# Patient Record
Sex: Male | Born: 1960 | Race: White | Hispanic: No | Marital: Married | Smoking: Former smoker
Health system: Southern US, Community
[De-identification: ages and names within clinical notes are randomized; demographics above are authoritative.]

## PROBLEM LIST (undated history)

## (undated) DIAGNOSIS — I2510 Atherosclerotic heart disease of native coronary artery without angina pectoris: Secondary | ICD-10-CM

## (undated) DIAGNOSIS — E119 Type 2 diabetes mellitus without complications: Secondary | ICD-10-CM

## (undated) DIAGNOSIS — R51 Headache: Secondary | ICD-10-CM

## (undated) DIAGNOSIS — E669 Obesity, unspecified: Secondary | ICD-10-CM

## (undated) DIAGNOSIS — I639 Cerebral infarction, unspecified: Secondary | ICD-10-CM

## (undated) DIAGNOSIS — L0591 Pilonidal cyst without abscess: Secondary | ICD-10-CM

## (undated) DIAGNOSIS — M109 Gout, unspecified: Secondary | ICD-10-CM

## (undated) DIAGNOSIS — D496 Neoplasm of unspecified behavior of brain: Secondary | ICD-10-CM

## (undated) DIAGNOSIS — R600 Localized edema: Secondary | ICD-10-CM

## (undated) DIAGNOSIS — I509 Heart failure, unspecified: Secondary | ICD-10-CM

## (undated) DIAGNOSIS — R233 Spontaneous ecchymoses: Secondary | ICD-10-CM

## (undated) DIAGNOSIS — N200 Calculus of kidney: Secondary | ICD-10-CM

## (undated) DIAGNOSIS — R238 Other skin changes: Secondary | ICD-10-CM

## (undated) DIAGNOSIS — I255 Ischemic cardiomyopathy: Secondary | ICD-10-CM

## (undated) DIAGNOSIS — I219 Acute myocardial infarction, unspecified: Secondary | ICD-10-CM

## (undated) DIAGNOSIS — R0602 Shortness of breath: Secondary | ICD-10-CM

## (undated) DIAGNOSIS — I10 Essential (primary) hypertension: Secondary | ICD-10-CM

## (undated) DIAGNOSIS — J189 Pneumonia, unspecified organism: Secondary | ICD-10-CM

## (undated) DIAGNOSIS — M797 Fibromyalgia: Secondary | ICD-10-CM

## (undated) HISTORY — DX: Essential (primary) hypertension: I10

## (undated) HISTORY — PX: EYE SURGERY: SHX253

## (undated) HISTORY — DX: Ischemic cardiomyopathy: I25.5

## (undated) HISTORY — DX: Obesity, unspecified: E66.9

## (undated) HISTORY — PX: UMBILICAL HERNIA REPAIR: SHX196

## (undated) HISTORY — PX: CORONARY ARTERY BYPASS GRAFT: SHX141

## (undated) HISTORY — PX: ANAL FISTULECTOMY: SHX1139

## (undated) HISTORY — DX: Atherosclerotic heart disease of native coronary artery without angina pectoris: I25.10

## (undated) HISTORY — DX: Fibromyalgia: M79.7

---

## 2003-03-11 ENCOUNTER — Ambulatory Visit (HOSPITAL_COMMUNITY): Admission: RE | Admit: 2003-03-11 | Discharge: 2003-03-11 | Payer: Self-pay

## 2007-04-08 ENCOUNTER — Ambulatory Visit (HOSPITAL_COMMUNITY): Admission: AD | Admit: 2007-04-08 | Discharge: 2007-04-09 | Payer: Self-pay | Admitting: Surgery

## 2007-04-08 ENCOUNTER — Encounter (INDEPENDENT_AMBULATORY_CARE_PROVIDER_SITE_OTHER): Payer: Self-pay | Admitting: Surgery

## 2007-12-09 ENCOUNTER — Inpatient Hospital Stay (HOSPITAL_COMMUNITY): Admission: EM | Admit: 2007-12-09 | Discharge: 2007-12-16 | Payer: Self-pay | Admitting: Emergency Medicine

## 2007-12-10 ENCOUNTER — Encounter: Payer: Self-pay | Admitting: Cardiothoracic Surgery

## 2007-12-11 ENCOUNTER — Encounter: Payer: Self-pay | Admitting: Cardiothoracic Surgery

## 2007-12-11 ENCOUNTER — Ambulatory Visit: Payer: Self-pay | Admitting: Cardiothoracic Surgery

## 2008-01-10 ENCOUNTER — Encounter: Admission: RE | Admit: 2008-01-10 | Discharge: 2008-01-10 | Payer: Self-pay | Admitting: Cardiothoracic Surgery

## 2008-01-10 ENCOUNTER — Ambulatory Visit: Payer: Self-pay | Admitting: Cardiothoracic Surgery

## 2011-01-03 NOTE — Op Note (Signed)
NAME:  BALEN, PINAULT NO.:  [id number]   MEDICAL RECORD NO.:  [id number]          PATIENT TYPE:  INP   LOCATION:  2306                         FACILITY:  [facility]   PHYSICIAN:  Ivin Poot, M.D.  DATE OF BIRTH:  [birth year]   DATE OF PROCEDURE:  12/12/2007  DATE OF DISCHARGE:                               OPERATIVE REPORT   OPERATION:  1. Coronary artery bypass grafting x1 using left internal mammary      artery to LAD.  2. Cardiopulmonary bypass with moderate hypothermia and hyperkalemic      cold cardioplegic arrest.   SURGEON:  Ivin Poot, MD   ASSISTANT:  Lars Pinks, PA   ANESTHESIA:  General.   PRE AND POSTOPERATIVE DIAGNOSES:  1. Class IV unstable angina with occlusion of the LAD.  2. Anterior - apical hypokinesia.   INDICATIONS:  Mr. Tardy is a 50 year old morbidly obese smoker who  presented with accelerating chest pain and mildly positive enzymes.  Cardiac catheterization by Dr. Acie Fredrickson demonstrated an occluded LAD with  reconstitution via collaterals.  The circumflex right coronary did not  have significant stenosis and he had anterior LV dysfunction by echo and  ventriculogram.  He is felt to be a candidate for surgical evaluation  since he had a long segment total occlusion of the LAD, which would be  very difficult to treat percutaneously.   Prior to surgery, I examined the patient in the CCU Room and reviewed  the results of his cardiac cath with the patient and his wife.  I  discussed indications and expected benefits of coronary artery bypass  surgery for treatment of his severe coronary artery disease.  I reviewed  the alternatives to surgical therapy as well.  I discussed the major  issues of the operation including the location of the surgical  incisions, use of general anesthesia, and cardiopulmonary bypass, and  the expected postoperative hospital recovery.  He understood that due to  his morbid obesity with weight  close to 400 pounds that he would be at  increased risk for pulmonary complications following surgery as well as  stroke, bleeding, wound infection, blood transfusion requirement, MI,  and death.  After reviewing these issues, he demonstrated his  understanding and agreed to proceed with operation under what I felt was  an informed consent.   OPERATIVE FINDINGS:  The patient did have some disease of a diagonal  branch and endoscopic vein harvest was attempted in both lower legs but  the vein was not identified due to his massive size leg and significant  subcutaneous fat.  I did not feel that an open leg incision with his  risk of nonhealing or worse would be indicated to graft a small  diffusely diseased diagonal.  So, we did not graft the diagonal.  The  mammary artery had excellent flow and provided a good graft to the  anterior wall via the LAD, which was a large vessel as well.  The  patient did not require any blood products for this operation.  Exposure  of the heart was extremely difficult due to his  morbid obesity and body  habitus.  Vessels were deeply buried in a thick layer of epicardial fat.  There was some thinning of the LV apex.   PROCEDURE:  The patient was brought to the operating room, placed supine  on the operating table where general anesthesia was induced.  The chest,  abdomen, and legs were prepped with Betadine and draped as a sterile  field.  A sternal incision was made in the left internal mammary artery  was harvested as a pedicle graft from its origin at the subclavian  vessels.  The sternal retractor was placed using the deep blades and the  pericardium was opened and suspended.  Saphenous vein was unattainable  from either leg using endoscopic vein harvest technique.  Pursestrings  were placed in the ascending aorta and right atrium.  The patient was  given heparin and ACT was documented as being therapeutic.  The patient  was then cannulated and placed on  bypass.  The LAD was identified for  grafting in the mid vessel.  The diagonal branches were very small and  diffusely diseased.  The internal mammary artery was prepared for the  distal anastomosis and a cardioplegic catheter was placed in the  ascending aorta.  The patient was cooled to 32 degrees and aortic  crossclamp was applied.  A 1.2 liters of cold blood cardioplegia was  delivered to the aortic root with good cardioplegic arrest and several  temperature dropping less than 18 degrees.   The distal coronary anastomoses were then performed.  The LAD was opened  and was found to be a 1.75-mm vessel with total proximal occlusion.  The  coronary probe passed easily to the apex.  The left internal mammary  artery pedicle was brought through an opening, created in the left  lateral pericardium.  It was brought down onto the LAD and sewn end-to-  side with running 8-0 Prolene.  There is good flow through the  anastomosis after briefly releasing the pedicle bulldog on the mammary  artery.  The bulldog was reapplied and a pedicle was secured at the  epicardium.   The crossclamp was then removed and the bulldog was removed from the  mammary artery.  The heart was reperfused and resumed a spontaneous  rhythm.  The distal anastomosis was checked and found be hemostatic.  The temporary pacing wires were applied.  The patient was rewarmed to 37  degrees.  The lungs were re-expanded and ventilator was resumed.  The  patient was then weaned off bypass with low-dose dopamine without  difficulty.  Cardiac output and blood pressure were stable.  He was in a  atrially paced rhythm at 90 beats per minute.  Protamine was  administered without adverse reaction.  The cannulas were removed.  The  mediastinum was irrigated with warm saline.  The superior pericardial  fat was closed over the aorta.  Two mediastinal and a left pleural chest  tube were placed, brought out through separate incisions.  The  sternum  was closed with interrupted double steel wires.  The pectoralis fascia  was closed in running #1 Vicryl and the subcutaneous and skin layers  were closed in running Vicryl.  Total bypass time was 74 minutes.  The  patient will return to the ICU in stable condition.      Ivin Poot, M.D.  Electronically Signed     PV/MEDQ  D:  12/12/2007  T:  12/13/2007  Job:  [id number]   cc:  Thayer Headings, M.D.

## 2011-01-03 NOTE — Op Note (Signed)
NAME:  [name], RAO NO.:  [id number]   MEDICAL RECORD NO.:  [id number]          PATIENT TYPE:  OIB   LOCATION:  6731                         FACILITY:  [facility]   PHYSICIAN:  Fenton Malling. Lucia Gaskins, M.D.  DATE OF BIRTH:  02/10/1961   DATE OF PROCEDURE:  04/08/2007  DATE OF DISCHARGE:                               OPERATIVE REPORT   PREOPERATIVE DIAGNOSIS:  Incarcerated umbilical hernia.   POSTOPERATIVE DIAGNOSIS:  Incarcerated umbilical hernia, approximately 6  cm fascial defect, incarcerated omentum.   PROCEDURE:  Open umbilical hernia repair, resection of omentum, repair  with underlay 8 x 14 cm MTF thick mesh.  (45 plus minutes dissecting  hernia/incarcerated omentum.)   SURGEON:  Fenton Malling. Lucia Gaskins, M.D.   FIRST ASSISTANT:  None.   ANESTHESIA:  General endotracheal.   ESTIMATED BLOOD LOSS:  150 mL.   DRAINS:  19 Blake drain.   INDICATIONS:  Mr. Montague is a 50 year old white male who is morbidly  obese, patient of Dr. Claris Gower, who has had an umbilical hernia  long standing, however, over the last few days it has become  increasingly tender and developed some redness around it.  He presented  to the urgent office where he was seen by Dr. Verita Lamb, who asked me  to see him for consultation for evaluation and repair of this hernia.   My feeling is that he has a problem incarcerated hernia, he has some  surrounding redness, was unclear whether this has already infarcted  tissue or some localized cellulitis, it thought it would be best served  tonight.   The indications and potential complications explained to the patient, of  surgery include bleeding, infection, recurrence of the hernia and bowel  resection.   DESCRIPTION OF PROCEDURE:  The patient was placed in the supine  position, given a general endotracheal anesthetic.  He was given 2 g of  Ancef initially at the beginning of the procedure.  His abdomen was  prepped and draped with Betadine  solution and sterilely draped.   I made a linear incision down on top of a large chronically incarcerated  umbilical hernia.  This sac probably was 14 to 16 cm in diameter.  It  narrowed down to approximately 5 to 6 cm defect in the fascia.  I opened  the sac.  I was unable to reduce the omentum, so I resected a large  amount of his omentum.  I resected using 2-0 Vicryl ties.  I then was  able to reduce the transverse colon and remaining omentum back into the  abdominal cavity.  I then resected the sac down to the fascial level.  I  transected the sac but the sac had a very good blood supply and I had to  ligate each with clamps, with 2-0 Vicryl sutures.  I then developed a  plane between the fascial surface and the peritoneum and tried to go  subfascial extraperitoneal repair.   I used an 8 x 14 cm piece of MTF thick human AlloDerm.   I laid this down first under the fascia (but on top of the  peritoneum).  I tacked it in 8 spots using #1 PDS suture, and this was an underlay.   Then I closed the fascia transversely using interrupted #1 Novafil  sutures.   At this point I irrigated the wound.  I closed the subcutaneous tissue  with some 2-0 Vicryl sutures.  I placed a #19 Blake drain through a stab  wound in the left lateral abdomen and then closed the skin both with 2-0  nylon and staples, and I used the 2-0 nylon to hold the drain in place.   The patient tolerated the procedure well.  The patient did have redness  around the hernia preoperatively but had no obvious compromised bowel in  getting to the hernial sac.  He tolerated the procedure well.  I did  spend extra time dissecting this hernial sac and omentum which I think  was a tune of bout 45 minutes to an hour.   This was all because of his weight and size of the hernia and his  defect.      Fenton Malling. Lucia Gaskins, M.D.  Electronically Signed     DHN/MEDQ  D:  04/08/2007  T:  04/09/2007  Job:  [id number]   cc:   Claris Gower, M.D.

## 2011-01-03 NOTE — Consult Note (Signed)
NAME:  [name], [name] NO.:  [id number]   MEDICAL RECORD NO.:  [id number]           PATIENT TYPE:   LOCATION:                                 FACILITY:   PHYSICIAN:  Ivin Poot, M.D.  DATE OF BIRTH:  May 27, 1961   DATE OF CONSULTATION:  DATE OF DISCHARGE:                                 CONSULTATION   PHYSICIAN REQUESTING CONSULTATION:  Thayer Headings, MD   PRIMARY CARE PHYSICIAN:  Claris Gower, MD   REASON FOR CONSULTATION:  Severe 2-vessel coronary artery disease.   CHIEF COMPLAINT:  Chest pain.   HISTORY OF PRESENT ILLNESS:  I was asked to evaluate this 50 year old  morbidly obese male smoker for potential surgical coronary  revascularization for recently diagnosed severe LAD diagonal stenosis.  The patient has had progressive dyspnea on exertion, ankle swelling, and  chest discomfort with exertion.  This progressed postprandial  indigestion and chest pain lasting for 12 hours radiating to the left  hand.  It was improved with nitroglycerin, and he presented to the  hospital for admission.  His initial set of cardiac enzymes were mildly  elevated with a CPK-MB of 12 ng/mL, which progressed to 22 ng/mL.  His  12-lead EKG showed a sinus rhythm with some mild ST-segment changes.  He  remained hemodynamically stable.  He underwent a left heart diagnostic  catheterization today by Dr. Acie Fredrickson, which demonstrated a 99% stenosis  of the LAD with a 90% stenosis of the diagonal and a thrombus in the  LAD.  The circumflex and RCA did not have significant lesions.  His EF  was 45% with hypokinesia of the LV apex.  He was placed on Integrilin  and heparin and has been clinically stable.  Because of his coronary  anatomy and symptoms.  He was felt to be a potential candidate for  surgical revascularization.   PAST MEDICAL HISTORY:  1. Hypertension.  2. Morbid obesity.  3. Active smoking.  4. Status post repair of umbilical hernia by Dr. Alphonsa Overall.  5.  Kidney stones.   MEDICATIONS:  1. Timolol 50 mg daily.  2. Aspirin 1 daily.   ALLERGIES:  MORPHINE and CODEINE cause him to have bradycardia.  He can  take hydrocodone.   SOCIAL HISTORY:  The patient is married and runs a malt service.  He  smokes a half-a-pack a day and also chews snuff.  His alcohol intake is  minimal.   FAMILY HISTORY:  Positive for coronary artery disease, positive for  obesity, and positive for diabetes.   REVIEW OF SYSTEMS:  He states he had an accident on his [employer] machine  at work and developed some soft tissue injury of his right lower leg  with some cellulitis, which responded to oral antibiotics.  Otherwise,  he has had no other serious musculoskeletal injuries.  He denies history  of stroke or mini stroke or TIA.  He denies history of DVT or  claudication.  He denies any bleeding disorders.  He states he tolerated  the umbilical hernia repair well with general anesthesia and was  discharged home the  same day of the surgery.  He denies any recent  pulmonary infections or productive cough.  He denies history of  jaundice, blood per rectum, or hepatitis.  He has never received blood  transfusions previously.   PHYSICAL EXAM:  GENERAL:  The patient is 5 feet 11 inches and weighs 370  pounds.  General appearance is that of a middle-aged, morbidly obese  male in the CCU in no acute distress.  VITAL SIGNS:  Blood pressure 110/70, pulse 88 and regular, respirations  18, and saturation 97% on room air.  HEENT:  Normocephalic.  He has poor dentition.  NECK:  Without JVD, mass, or carotid bruit.  LYMPH:  Lymphatics reveal no palpable supraclavicular adenopathy.  LUNGS:  Breath sounds are diminished, but without wheeze.  CARDIAC:  Regular without S3, gallop, or murmur.  ABDOMINAL:  Morbidly obese with a well-healed umbilical surgical scar.  EXTREMITIES:  Reveal 1 to 2+ edema with palpable pulses in all  extremities.  He has a compression dressing his right  groin from the  cardiac cath, and his neurologic exam is intact.  There is no evidence  of cellulitis or other dermatologic problems of his lower extremities  with only mild venostasis changes.   LABORATORY DATA:  Reviewed the coronary arteriograms from Dr. Elmarie Shiley  cath and agree with his impression of severe 2-vessel disease.  The  patient was scheduled for surgical revascularization on Thursday, December 12, 2007.  Prior to surgery, we will obtain a 2D echo to assess his  pulmonary function with a chest x-ray and a blood gas and maintain his  heparin and Integrilin.   Thank you for the consultation.      Ivin Poot, M.D.  Electronically Signed     PV/MEDQ  D:  12/10/2007  T:  12/11/2007  Job:  [id number]

## 2011-01-03 NOTE — H&P (Signed)
NAME:  [name], MOTTL NO.:  [id number]   MEDICAL RECORD NO.:  [id number]          PATIENT TYPE:  INP   LOCATION:  2917                         FACILITY:  [facility]   PHYSICIAN:  Thayer Headings, M.D. DATE OF BIRTH:  [birth year]   DATE OF ADMISSION:  12/09/2007  DATE OF DISCHARGE:                              HISTORY & PHYSICAL   Ketrick Przywara is a middle-aged gentleman who is admitted today with  some episodes of chest discomfort.   Mr. Brutsche is a middle-aged gentleman with a history of hypertension,  obesity and cigarette smoking.  He has been in fairly good health.  He  admits to not really taking good care of himself.  He does not need any  special diet.   He started having chest pain around 1:00 a.m. this morning.  He thought  was indigestion and decided to ignore.  He was able to go to sleep for a  couple hours but then the pain will come up again.  He presented to see  Dr. Arelia Sneddon and was found to have some nonspecific but worrisome EKG  changes.  He is referred to our office for further evaluation.   He received sublingual nitroglycerin from Dr. Arelia Sneddon with almost  complete resolution of his chest pain.   He denies any syncope or presyncope.  He has been a little bit  lightheaded since taking the nitroglycerin earlier today.   CURRENT MEDICATIONS:  Current medications are atenolol 25 mg p.o. b.i.d.  He has been intolerant to morphine sulfate in past.   ALLERGIES:  He also is allergic to PENICILLIN.   PAST MEDICAL HISTORY:  History of hypertension.   SOCIAL HISTORY:  The patient smokes about a quater-pack a day and dips  tobacco.   FAMILY HISTORY:  His family history is positive for cardiac disease.   REVIEW OF SYSTEMS:  His review of systems is reviewed, is essentially  negative except as noted in the HPI.   PHYSICAL EXAMINATION:  GENERAL: On exam he is a middle-aged gentleman in  no acute distress.  He is alert nor did x3 and his mood and  affect are  normal.  VITAL SIGNS: His weight is 360, blood pressures 100/60 with heart rate  of 73.  HEENT: 2+ carotids.  He has no bruits, no JVD, no thyromegaly.  LUNGS: Clear to auscultation.  Heart: Regular rate S1-S2.  ABDOMEN: Abdominal exam reveals good bowel sounds and is nontender.  EXTREMITIES: There is no clubbing, cyanosis or edema.  NEURO: Exam is nonfocal.   His EKG reveals normal sinus rhythm.  He has some T-wave inversions in  leads II, III and aVF with some nonspecific changes in the leads V6.   Mr. Widrig presents with some worrisome episodes of chest pain.  He has  nonspecific EKG.  At this point, we will go ahead and admit him to the  hospital.  We will put on heparin and Integrilin as well as a  nitroglycerin drip.  We will start him on an aspirin a day.  We will  perform a heart catheterization tomorrow.  We have discussed  the risks,  benefits and options of heart catheterization.  He understands and  agrees to proceed.  All of his other medical problems remain fairly  stable.           ______________________________  Thayer Headings, M.D.     PJN/MEDQ  D:  12/09/2007  T:  12/10/2007  Job:  [id number]   cc:   Claris Gower, M.D.

## 2011-01-03 NOTE — Cardiovascular Report (Signed)
NAME:  [name], [name] NO.:  [id number]   MEDICAL RECORD NO.:  [id number]          PATIENT TYPE:  INP   LOCATION:  2917                         FACILITY:  [facility]   PHYSICIAN:  Thayer Headings, M.D. DATE OF BIRTH:  July 28, 1961   DATE OF PROCEDURE:  12/10/2007  DATE OF DISCHARGE:                            CARDIAC CATHETERIZATION   INDICATIONS FOR PROCEDURE:  [name] is a 50 year old  gentleman who was admitted yesterday with a 12-hour history of chest  pain and an abnormal EKG.  He ended up with unstable angina and was  found to have mildly positive troponin levels.  He was referred for  heart catheterization for further evaluation.   PROCEDURE:  Left heart catheterization with coronary angiography.  The  right femoral artery was easily cannulated using modified Seldinger  technique.   HEMODYNAMICS:  LV pressure is 138/70 with an aortic pressure of 136/75.   Angiography of left main, the left main has minor luminal  irregularities.   The left anterior descending artery is a very large vessel.  There is  extensive plaque and ectasia throughout the vessel.   There is a proximal 80% stenosis of the bifurcation of the diagonal and  LAD.  Following this, there is an aneurysmal area followed by subtotal  occlusion of the LAD.  This is associated with a very large thrombus  burden.  Following this, the LAD is severely and diffusely diseased.  There is a long 70%-80% stenosis at about 50 mm in length.  This opens  up into the distal LAD which is a fairly good target for either bypass  grafting or stenting.   The first diagonal artery is occluded shortly after the takeoff.  The  distal aspect of this first diagonal artery fills very slowly via left-  to-left collaterals.  There is also some filling of either the LAD or  the diagonal system from right-to-left collaterals.   The left circumflex artery is a large vessel.  There are minor luminal  irregularities.  It gives off a first obtuse marginal artery which has  minor luminal irregularities.  The posterolateral branch has only minor  luminal irregularities.   The right coronary artery is large and dominant.  There is a proximal  28%-30% stenosis.  There are mild irregularities throughout the body of  the RCA.  There is a 40%-50% stenosis in the proximal aspect of the  posterior descending artery.   Left ventriculogram was performed in a 30 RAO position.  It reveals  mildly depressed left ventricular systolic function.  Ejection fraction  was approximately 45%.  There is some mild hypokinesis of the anterior  wall.  There is no significant mitral regurgitation.   COMPLICATIONS:  None.   CONCLUSION:  Severe disease involving the left anterior descending and  diagonal system.  We will have cardiovascular-thoracic surgery evaluate  the patient.  We will continue Integrilin and heparin for now.  We could  fix this via percutaneous methods, although it would involve multiple  stents and would not have a fairly high risk of restenosis.  ______________________________  Thayer Headings, M.D.     PJN/MEDQ  D:  12/10/2007  T:  12/10/2007  Job:  [id number]   cc:   Claris Gower, M.D.

## 2011-01-03 NOTE — Discharge Summary (Signed)
NAME:  [name], [name] NO.:  [id number]   MEDICAL RECORD NO.:  [id number]          PATIENT TYPE:  INP   LOCATION:  2017                         FACILITY:  [facility]   PHYSICIAN:  Ivin Poot, M.D.  DATE OF BIRTH:  [birth year]   DATE OF ADMISSION:  12/09/2007  DATE OF DISCHARGE:  12/16/2007                               DISCHARGE SUMMARY   ADMITTING DIAGNOSES:  1. Unstable angina.  2. Coronary artery disease (EF of 45%).  3. History of hypertension.  4. History of morbid obesity.   DISCHARGE DIAGNOSES:  1. Unstable angina.  2. Coronary artery disease (EF of 45%).  3. History of hypertension.  4. History of morbid obesity.  5. Postoperative atrial fibrillation.   PROCEDURES:  1. Cardiac catheterization performed by Dr. Acie Fredrickson on December 10, 2007.  2. Aortocoronary bypass x1 (LIMA to LAD) by Dr. Prescott Gum on December 12, 2007.   [place] COURSE:  [name] is a 50 year old Caucasian male  with a history of morbid obesity, hypertension, and tobacco use who  presented with chest discomfort and progressive dyspnea upon exertion  and ankle swelling.  His chest pain then began radiating to his left  hand.  His initial set of cardiac enzymes were mildly elevated with a  CPK-MB of 12, which was then increased to 22.  His 12-lead EKG showed  sinus rhythm with some mild ST-segment changes.  He did remain  hemodynamically stable.  He underwent a heart catheterization with Dr.  Acie Fredrickson, which demonstrated a 99% stenosis of the LAD with thrombus and a  90% stenosis of the diagonal.  The circumflex and RCA did not have  significant lesions.  His EF was estimated to be at 45% with hypokinesia  left ventricular apex.  He was then placed on Integrilin and heparin.  Carotid study showed no significant internal carotid artery stenosis.  The patient also had an echocardiogram done on December 11, 2007, which  showed no significant valvular disease, and minimal  pericardial effusion  and decreased left ventricular function were noted.  Integrilin was  stopped on December 11, 2007.  The patient then underwent CABG with Dr. Prescott Gum on December 12, 2007.  Chest tubes were removed on December 13, 2007.  Subsequent chest x-ray revealed no pneumothorax.  Postop, the patient  had atrial fibrillation.  He was placed on an amiodarone drip, all by  p.o. amiodarone, in addition, because of the AFib with RVR, the patient  was given IV digoxin, and then placed on 0.125 mg p.o. digoxin daily.  The patient did convert to normal sinus rhythm.  He was volume  overloaded and diuresed appropriately (leukocytosis).  The patient  remained afebrile.  He was placed on Avelox daily and gradually in next  couple of days, his leukocytosis continued to decrease, his last white  blood cell count was 12,600 (on December 16, 2007,) and again he remained  afebrile.  The patient was doing well with cardiac rehab.  His oxygen  requirements were decreasing daily, and he continued to improve such  that by  December 16, 2007, the patient again was in normal sinus rhythm.  He was afebrile.  His vital signs were stable.  His O2 saturation was  93% on room air.  Chest x-ray today showed improved aeration of the  lungs bilaterally, basilar atelectasis, and small bilateral pleural  effusions were present.   PHYSICAL EXAMINATION:  CARDIOVASCULAR: Regular rate and rhythm.  PULMONARY: Lungs were slightly diminished at the bases.  Wounds were  clean and dry.  No signs of infection.   The patient is going to be discharged home today.   PREVIOUS LABORATORY STUDIES:  Revealed that the last CBC was done on  December 16, 2007, H and H was stable 13.7 and 40.0 respectively, white  count of 2600, platelet count of 328,000.  Last BMP was also done on  this date, potassium is 3.9, BUN and creatinine was 15 and 1.6  respectively.  Last chest x-ray was done again today, results noted  previously.   DISCHARGE  INSTRUCTIONS:  As follows:  Epicardial pacing wires have been  removed.  Chest tube sutures will be removed.  Steri-Strips will be  applied.  In addition, PICC line will be removed prior to the patient's  discharge.  He is instructed not to drive or lift more than 10 pounds  until instructed, otherwise.  He is to continue using his inspirometer  and do breathing exercises daily as well as walk daily.  He is to remain  on a low-fat, low-salt diet.  He is to stop smoking.   FOLLOWUP APPOINTMENTS:  Include:  Dr. Elmarie Shiley office in 2 weeks.  The  patient is to call to arrange an appointment.  The patient has  appointment to see Dr. Prescott Gum on Jan 10, 2008, and prior to his  appointment, a chest x-ray will be obtained.   DISCHARGE MEDICATIONS:  Include the following:  1. Atenolol 25 mg p.o. daily.  2. Zocor 20 mg p.o. at bedtime.  3. Amiodarone 400 mg p.o. 2 times daily x5 days, and amiodarone 200 mg      p.o. 2 times daily thereafter.  4. Lisinopril 10 mg p.o. daily.  5. Advair Diskus inhaler 250/50, one puff two times daily, the inhaler      is to be given to the patient prior to discharge.  Once his inhaler      is finished, he may stop this medication.  6. Avelox 400 mg p.o. daily x5 days.  7. Digoxin 0.125 mg p.o. daily.  8. Lasix 40 mg p.o. daily x5 days.  9. KCl 20 mEq p.o. daily x5 days.  10.Ultram 50 mg 1-2 tablets p.o. q.4-6 h. p.r.n. pain.      [name], [place]      Ivin Poot, M.D.  Electronically Signed    DZ/MEDQ  D:  12/16/2007  T:  12/17/2007  Job:  [id number]

## 2011-01-03 NOTE — Assessment & Plan Note (Signed)
OFFICE VISIT   [name], [name]  DOB:  08-Feb-1961                                        Jan 10, 2008  CHART #:  [id number]   CURRENT PROBLEMS:  1. Status post coronary bypass grafting December 12, 2007 for class IV      unstable angina.  2. Morbid obesity.  3. History of smoking.   CURRENT MEDICATIONS:  Atenolol 25 mg p.o. b.i.d., Zocor 20 mg nightly,  amiodarone 200 mg b.i.d., lisinopril 10 mg daily, Advair Diskus 2 puffs  b.i.d., aspirin one daily, digoxin 0.125 mg daily.   PRESENT ILLNESS:  The patient is an obese 50 year old patient who  presented with unstable angina and was found to have a 99% stenosis of  the LAD with thrombus after cardiac cath by Dr. Acie Fredrickson.  He underwent  left IMA grafting to his LAD.  His postoperative course was complicated  by transient atrial fibrillation which converted to sinus rhythm with  amiodarone.  He was only discharged to home on the fifth postoperative  day in sinus rhythm on the above medications.  Since returning home, he  has had no recurrent chest pain, but he has had some dizziness and some  weakness with walking.  His surgical incisions are healing well and he  has had no fever or productive cough.  He feels he has lost some weight  but he is not sure how much.   PHYSICAL EXAM:  Blood pressure 130/80, pulse 60, respirations 18,  saturation 93%.  He is alert and pleasant.  Breath sounds are clear  equal.  The sternum is stable and well healed.  Cardiac rhythm is  regular of murmur or rub.  The leg incisions are well-healed.  Of note,  the patient had unsuccessful exploration of each leg and no vein was  harvested.  He has minimal peripheral edema.  He does complain of some  numbness in the left hand, ulnar distribution, and in the left thigh  along the lateral femoral cutaneous nerve distribution.   A PA and lateral chest x-ray shows significantly improved lung aeration  without significant pleural  effusion a stable cardiac silhouette with  the sternal wires all intact.   IMPRESSION:  The patient is doing well.  He has some orthostatic and  exertional type symptoms which were probably related to too much  medication and we will discontinue the amiodarone and digoxin and  continue his beta-blocker and Statin aspirin, and asked him to take the  lisinopril at night.  He still has not seen Dr. Acie Fredrickson.  We will  facilitate his postoperative visit with Dr. Acie Fredrickson to further review his  medications.  I told when he felt stronger, he can resume driving and  light daily activities but not lift not to lift more than 20 pounds  until August 1.  He understands the importance of permanent smoking  cessation and a heart healthy diet and lifestyle.  He will return here  for any further surgical issues.   [name], M.D.  Electronically Signed   PV/MEDQ  D:  01/10/2008  T:  01/10/2008  Job:  [id number]   cc:   Thayer Headings, M.D.  Claris Gower, M.D.

## 2011-01-03 NOTE — H&P (Signed)
NAME:  [name], [name] NO.:  [id number]   MEDICAL RECORD NO.:  [id number]          PATIENT TYPE:  OIB   LOCATION:  2852                         FACILITY:  [facility]   PHYSICIAN:  Fenton Malling. Lucia Gaskins, M.D.  DATE OF BIRTH:  [birth year]   DATE OF ADMISSION:  04/08/2007  DATE OF DISCHARGE:                              HISTORY & PHYSICAL   HISTORY OF ILLNESS:  This is a 50 year old morbidly obese white male  patient of Dr. Claris Gower who has had an abdominal hernia he has  known about for at least 5 years.   He works with stump grinding and mulch, over the weekend had increasing  pain around his abdominal wall hernia and I think spoke to maybe Dr.  Zella Richer over the phone who suggested clear liquids and ice packing  and was seen in the urgent office today by Dr. Verita Lamb.   Dr. Rise Patience, who is on call at [facility], is busy and asked if I, on [facility]  call, could take coverage of his care.   The patient has had no prior history of peptic ulcer disease, liver  disease, gallbladder disease, colon disease.  He had no prior upper  endoscopy, no prior abdominal surgery.   PAST MEDICAL HISTORY:  HE IS ALLERGIC TO:  1. MORPHINE.  2. CODEINE.   Current Medications:  1. Atenolol 25 mg daily.  2. He says he takes a fluid pill, though there is some debate on      whether he takes that.   REVIEW OF SYSTEMS:  NEUROLOGIC:  No seizure or loss of consciousness.  PULMONARY:  He smokes 1 to 2 packs of cigarettes a day, knows that it is  bad for his health, it also increases the risk of wound problems and  wound infections.  CARDIAC:  He has had no history of heart disease or  chest pain on cardiac evaluation, though he has been hypertensive for a  number of years.  GASTROINTESTINAL:  See History of Present Illness.  UROLOGIC:  He has had multiple kidney stones, in 1992 he said he had  like 11 at one time.  He has not had any in the last 6 or 7 years.   He is accompanied by his wife.   Again, he works as a stump grinding and  with mulch.   PHYSICAL EXAM:  His height is 5 feet 11 inches, weight is 382 pounds,  his calculated BMI is 53.45, his blood pressure is 184/107, pulse is 79,  temperature 98.6.  He is a well-nourished, very large, white male alert  and cooperative on physical exam.  HEENT:  Unremarkable.  NECK:  Supple.  I feel no thyromegaly, no mass.  LUNGS:  Clear to auscultation.  HEART:  Regular rate and rhythm, I hear no murmur or rub.  ABDOMEN:  He has an approximate 12-14 cm inflamed mass at his umbilicus;  though it is fairly soft, it is consistent with a probable incarcerated  hernia.  He has no other abdominal tenderness, he has normal bowel  sounds.  He said he vomited once last night after  eating some chicken  noodle soup, but he had stools this morning so I really do not think he  is obstructed from this.  EXTREMITIES:  He has strength in all 4 extremities.  NEUROLOGIC:  Grossly intact.   IMPRESSION:  1. Incarcerated abdominal hernia.  Discussed with him hernia repairs, that normally for a large hernia I  would place mesh, though I think with his redness there is a good chance  I cannot put a permanent mesh but I may try a biologic mesh in there.  I  told him the risk of his having a recurrent hernia is very high,  probably in the order of 20 to 30%, particularly if he does not lose  weight.  He also has a risk of bleeding, infection which he may already  have, some kind of bowel injury which would require bowel resection.  I  explained all this to him.  I do not know how long this hospitalization will be, my guess would be  at least over night, possibly several days depending on the findings.  His biggest long term is he is to do no heavy lifting for at least 4  weeks after surgery.  1. Hypertension.  2. Morbid obesity.  He knows his weight is lethal in the long run and      significantly increases the risk of the hernia recurring.  3.  History of smoking cigarettes.  4. History of kidney stones.      Fenton Malling. Lucia Gaskins, M.D.  Electronically Signed     DHN/MEDQ  D:  04/08/2007  T:  04/08/2007  Job:  [id number]   cc:   Claris Gower, M.D.

## 2011-05-16 LAB — CBC
HCT: 40.2
HCT: 40.9
HCT: 41
HCT: 41.1
HCT: 42.1
HCT: 42.8
HCT: 45
HCT: 45
HCT: 46.1
HCT: 46.4
HCT: 49.5
Hemoglobin: 13.6
Hemoglobin: 13.6
Hemoglobin: 13.7
Hemoglobin: 13.8
Hemoglobin: 14
Hemoglobin: 14
Hemoglobin: 14.6
Hemoglobin: 15.7
Hemoglobin: 16.2
MCHC: 32.4
MCHC: 32.8
MCHC: 33.2
MCHC: 33.2
MCHC: 33.3
MCHC: 33.6
MCHC: 33.7
MCHC: 33.7
MCV: 88.2
MCV: 88.3
MCV: 88.6
MCV: 88.6
MCV: 88.8
MCV: 88.9
MCV: 88.9
MCV: 88.9
MCV: 90.3
Platelets: 201
Platelets: 238
Platelets: 241
Platelets: 248
Platelets: 250
Platelets: 271
Platelets: 287
Platelets: 294
Platelets: 296
Platelets: 328
Platelets: 368
RBC: 4.53
RBC: 4.53
RBC: 4.62
RBC: 4.65
RBC: 4.76
RBC: 4.82
RBC: 5.08
RBC: 5.19
RDW: 13.2
RDW: 13.6
RDW: 13.6
RDW: 13.6
RDW: 13.7
RDW: 13.9
RDW: 13.9
RDW: 14
RDW: 14.1
WBC: 11.2 — ABNORMAL HIGH
WBC: 12.6 — ABNORMAL HIGH
WBC: 13.7 — ABNORMAL HIGH
WBC: 16.3 — ABNORMAL HIGH
WBC: 16.5 — ABNORMAL HIGH
WBC: 18.4 — ABNORMAL HIGH
WBC: 18.8 — ABNORMAL HIGH
WBC: 19.1 — ABNORMAL HIGH
WBC: 9.1
WBC: 9.8

## 2011-05-16 LAB — CROSSMATCH
ABO/RH(D): O POS
Antibody Screen: NEGATIVE

## 2011-05-16 LAB — DIFFERENTIAL
Basophils Absolute: 0.2 — ABNORMAL HIGH
Eosinophils Relative: 5
Lymphocytes Relative: 23
Lymphs Abs: 2.6
Monocytes Absolute: 1
Monocytes Relative: 9
Neutro Abs: 7

## 2011-05-16 LAB — COMPREHENSIVE METABOLIC PANEL
ALT: 18
AST: 18
AST: 20
Albumin: 3.1 — ABNORMAL LOW
Albumin: 3.4 — ABNORMAL LOW
Alkaline Phosphatase: 91
BUN: 9
Calcium: 9.2
Chloride: 104
Creatinine, Ser: 0.87
GFR calc Af Amer: >60
GFR calc Af Amer: >60
Glucose, Bld: 132 — ABNORMAL HIGH
Potassium: 3.8
Sodium: 138
Total Bilirubin: 0.4
Total Protein: 6.3
Total Protein: 7.2

## 2011-05-16 LAB — BASIC METABOLIC PANEL
BUN: 11
BUN: 14
BUN: 15
BUN: 8
CO2: 25
CO2: 29
CO2: 31
CO2: 31
Calcium: 8.6
Calcium: 8.7
Calcium: 8.9
Calcium: 8.9
Chloride: 101
Chloride: 108
Chloride: 98
Chloride: 99
Creatinine, Ser: 0.86
Creatinine, Ser: 0.95
Creatinine, Ser: 1.06
Creatinine, Ser: 1.12
GFR calc Af Amer: >60
GFR calc Af Amer: >60
GFR calc Af Amer: >60
GFR calc Af Amer: >60
GFR calc non Af Amer: >60
GFR calc non Af Amer: >60
GFR calc non Af Amer: >60
GFR calc non Af Amer: >60
Glucose, Bld: 109 — ABNORMAL HIGH
Glucose, Bld: 115 — ABNORMAL HIGH
Glucose, Bld: 118 — ABNORMAL HIGH
Glucose, Bld: 127 — ABNORMAL HIGH
Potassium: 3.7
Potassium: 3.9
Potassium: 4.1
Potassium: 4.2
Sodium: 137
Sodium: 139
Sodium: 140
Sodium: 142

## 2011-05-16 LAB — CREATININE, SERUM
Creatinine, Ser: 0.86
Creatinine, Ser: 1.28
GFR calc Af Amer: >60
GFR calc Af Amer: >60
GFR calc non Af Amer: >60
GFR calc non Af Amer: >60

## 2011-05-16 LAB — POCT I-STAT 3, ART BLOOD GAS (G3+)
Acid-Base Excess: 2
Acid-base deficit: 1
Acid-base deficit: 1
Acid-base deficit: 2
Bicarbonate: 17.2 — ABNORMAL LOW
Bicarbonate: 24.8 — ABNORMAL HIGH
Bicarbonate: 25.5 — ABNORMAL HIGH
Bicarbonate: 26.7 — ABNORMAL HIGH
O2 Saturation: 91
O2 Saturation: 97
O2 Saturation: 99
Operator id: 256891
Operator id: 257021
Operator id: 257021
Operator id: 3406
Patient temperature: 36.6
Patient temperature: 37.3
Patient temperature: 37.4
TCO2: 23
TCO2: 27
TCO2: 27
TCO2: 28
pCO2 arterial: 36.6
pCO2 arterial: 38.9
pCO2 arterial: 43.9
pCO2 arterial: 49.9 — ABNORMAL HIGH
pH, Arterial: 7.354
pH, Arterial: 7.369
pH, Arterial: 7.388
pO2, Arterial: 64 — ABNORMAL LOW
pO2, Arterial: 95

## 2011-05-16 LAB — BLOOD GAS, ARTERIAL
Acid-Base Excess: 2.6 — ABNORMAL HIGH
Bicarbonate: 27.1 — ABNORMAL HIGH
Drawn by: 28701
O2 Content: 2
O2 Saturation: 93.9
Patient temperature: 98.2
TCO2: 28.5
pCO2 arterial: 45.2 — ABNORMAL HIGH
pH, Arterial: 7.394
pO2, Arterial: 69.9 — ABNORMAL LOW

## 2011-05-16 LAB — CARDIAC PANEL(CRET KIN+CKTOT+MB+TROPI)
CK, MB: 1.4
CK, MB: 11.3 — ABNORMAL HIGH
CK, MB: 2.3
CK, MB: 3.8
CK, MB: 7.6 — ABNORMAL HIGH
Relative Index: 12.5 — ABNORMAL HIGH
Relative Index: INVALID
Relative Index: INVALID
Total CK: 163
Total CK: 173
Total CK: 48
Total CK: 76
Total CK: 91
Troponin I: 0.68
Troponin I: 1.61
Troponin I: 1.78
Troponin I: 1.94

## 2011-05-16 LAB — POCT I-STAT 4, (NA,K, GLUC, HGB,HCT)
Glucose, Bld: 112 — ABNORMAL HIGH
Glucose, Bld: 130 — ABNORMAL HIGH
Glucose, Bld: 160 — ABNORMAL HIGH
HCT: 37 — ABNORMAL LOW
HCT: 43
Hemoglobin: 11.2 — ABNORMAL LOW
Hemoglobin: 12.6 — ABNORMAL LOW
Hemoglobin: 14.3
Hemoglobin: 15
Operator id: 3406
Operator id: 3406
Operator id: 3406
Potassium: 3.7
Potassium: 4
Potassium: 4.3
Potassium: 4.4
Sodium: 136
Sodium: 138
Sodium: 139

## 2011-05-16 LAB — HEMOGLOBIN AND HEMATOCRIT, BLOOD
HCT: 34.7 — ABNORMAL LOW
Hemoglobin: 11.4 — ABNORMAL LOW

## 2011-05-16 LAB — I-STAT 8, (EC8 V) (CONVERTED LAB)
HCT: 47
Hemoglobin: 16
Potassium: 4
Sodium: 142
pCO2, Ven: 52.9 — ABNORMAL HIGH
pH, Ven: 7.306 — ABNORMAL HIGH

## 2011-05-16 LAB — CK TOTAL AND CKMB (NOT AT ARMC)
CK, MB: 12.7 — ABNORMAL HIGH
Total CK: 95

## 2011-05-16 LAB — PROTIME-INR
INR: 1.3
Prothrombin Time: 16.5 — ABNORMAL HIGH

## 2011-05-16 LAB — APTT
aPTT: 33
aPTT: 33

## 2011-05-16 LAB — HEPARIN LEVEL (UNFRACTIONATED)
Heparin Unfractionated: 0.27 — ABNORMAL LOW
Heparin Unfractionated: 0.36
Heparin Unfractionated: <0.1 — ABNORMAL LOW

## 2011-05-16 LAB — POCT I-STAT, CHEM 8
Chloride: 105
Creatinine, Ser: 1
Glucose, Bld: 146 — ABNORMAL HIGH
HCT: 49
Potassium: 4.7

## 2011-05-16 LAB — MAGNESIUM
Magnesium: 2.5
Magnesium: 2.6 — ABNORMAL HIGH
Magnesium: 2.8 — ABNORMAL HIGH

## 2011-05-16 LAB — HEMOGLOBIN A1C: Hgb A1c MFr Bld: 5.9

## 2011-05-16 LAB — POCT I-STAT GLUCOSE: Operator id: 140821

## 2011-05-16 LAB — TSH: TSH: 1.081

## 2011-05-16 LAB — TROPONIN I: Troponin I: 0.4 — ABNORMAL HIGH

## 2011-05-16 LAB — PLATELET COUNT: Platelets: 255

## 2011-06-02 LAB — COMPREHENSIVE METABOLIC PANEL
ALT: 18
AST: 19
Albumin: 3.6
Alkaline Phosphatase: 104
GFR calc Af Amer: >60
Glucose, Bld: 131 — ABNORMAL HIGH
Potassium: 4
Sodium: 136
Total Protein: 7.2

## 2011-06-02 LAB — CBC
Hemoglobin: 18.3 — ABNORMAL HIGH
RDW: 13.5

## 2011-09-19 ENCOUNTER — Encounter: Payer: Self-pay | Admitting: Cardiology

## 2012-08-01 ENCOUNTER — Other Ambulatory Visit: Payer: Self-pay | Admitting: Family Medicine

## 2012-08-01 DIAGNOSIS — H5500 Unspecified nystagmus: Secondary | ICD-10-CM

## 2012-09-06 ENCOUNTER — Encounter: Payer: Self-pay | Admitting: *Deleted

## 2012-09-06 ENCOUNTER — Encounter: Payer: Self-pay | Admitting: Cardiology

## 2012-09-09 ENCOUNTER — Ambulatory Visit (INDEPENDENT_AMBULATORY_CARE_PROVIDER_SITE_OTHER): Payer: BC Managed Care – PPO | Admitting: Cardiology

## 2012-09-09 ENCOUNTER — Encounter: Payer: Self-pay | Admitting: Cardiology

## 2012-09-09 ENCOUNTER — Telehealth: Payer: Self-pay | Admitting: Cardiology

## 2012-09-09 VITALS — BP 142/82 | HR 70 | Ht 67.0 in | Wt >= 6400 oz

## 2012-09-09 DIAGNOSIS — F17200 Nicotine dependence, unspecified, uncomplicated: Secondary | ICD-10-CM

## 2012-09-09 DIAGNOSIS — T82218A Other mechanical complication of coronary artery bypass graft, initial encounter: Secondary | ICD-10-CM | POA: Insufficient documentation

## 2012-09-09 DIAGNOSIS — I25810 Atherosclerosis of coronary artery bypass graft(s) without angina pectoris: Secondary | ICD-10-CM

## 2012-09-09 DIAGNOSIS — Z01810 Encounter for preprocedural cardiovascular examination: Secondary | ICD-10-CM

## 2012-09-09 DIAGNOSIS — Z720 Tobacco use: Secondary | ICD-10-CM

## 2012-09-09 NOTE — Telephone Encounter (Signed)
Will not have to walk with this type of testing.  He is aware.

## 2012-09-09 NOTE — Progress Notes (Signed)
HPI The patient presents for follow up of CAD and preoperative examination.  He has a history of CABG in 2009 with single vessel (LAD) disease.  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 I don't have a report of this other than a request for consultation. I was able to review his old records however. Unfortunately his tumor which she reports to be benign has limited his functional capacity. He can get around with a rolling walker or a wheelchair or motorized scooter. He's not having any chest discomfort but he never reports having any of this.  He denies any acute shortness of breath, PND or orthopnea. He has no palpitations, presyncope or syncope. He does have some bilateral chronic lower extremity edema.  Allergies  Allergen Reactions  . Penicillins     Current Outpatient Prescriptions  Medication Sig Dispense Refill  . atenolol (TENORMIN) 25 MG tablet Take 25 mg by mouth 2 (two) times daily.      Marland Kitchen HYDROcodone-acetaminophen (VICODIN) 5-500 MG per tablet Take 1 tablet by mouth every 6 (six) hours as needed.        Past Medical History  Diagnosis Date  . CAD (coronary artery disease)     Cath 09, 99% LAD  . Cardiomyopathy, ischemic     Ischemic  . Obesity   . Fibromyalgia     Past Surgical History  Procedure Date  . Coronary artery bypass graft     LIMA to LAD 2009  . Umbilical hernia repair   . Anal fistulectomy     Family History  Problem Relation Age of Onset  . CAD Father     History   Social History  . Marital Status: Married    Spouse Name: N/A    Number of Children: N/A  . Years of Education: N/A   Occupational History  . Not on file.   Social History Main Topics  . Smoking status: Light Tobacco Smoker    Types: Cigarettes  . Smokeless tobacco: Current User     Comment: Rare tobacco.  "[name]"  . Alcohol Use: No  . Drug Use: No  . Sexually Active: Not on file   Other Topics Concern    . Not on file   Social History Narrative  . No narrative on file    ROS: Positive for dizziness, constipation, urinary frequency.  Otherwise as stated in the HPI and negative for all other systems.  PHYSICAL EXAM BP 142/82  Pulse 70  Ht 5\' 7"  (1.702 m)  Wt 425 lb (192.779 kg)  BMI 66.56 kg/m2 GEN:  No distress NECK:  No jugular venous distention at 90 degrees, waveform within normal limits, carotid upstroke brisk and symmetric, no bruits, no thyromegaly LYMPHATICS:  No cervical adenopathy LUNGS:  Clear to auscultation bilaterally BACK:  No CVA tenderness CHEST:  Well healed sternotomy scar. HEART:  S1 and S2 within normal limits, no S3, no S4, no clicks, no rubs, no murmurs ABD:  Positive bowel sounds normal in frequency in pitch, no bruits, no rebound, no guarding, unable to assess midline mass or bruit with the patient seated, morbidly obese EXT:  2 plus pulses throughout, bilateral mild lower extremity edema, no cyanosis no clubbing SKIN:  No rashes no nodules NEURO:  Cranial nerves II through XII grossly intact, motor grossly intact throughout PSYCH:  Cognitively intact, oriented to person place and time    EKG:  Sinus rhythm, rate 70, right axis deviation (question lead placement), poor anterior R wave progression, nonspecific inferior T wave changes. No old EKGs for comparison. 09/09/2012  ASSESSMENT AND PLAN  Preoperative cardiovascular examination - The patient has known vascular disease. He has a very low functional status. He has ongoing risk factors and is going for surgery that is at least moderate risk from a cardiovascular standpoint. Therefore, stress testing according to ACC/AHA guidelines he is indicated. He would not be a little walk on a treadmill. Therefore, he will need a daily Lexiscan Myoview.  CAD This will be evaluated as above. We discussed risk reduction. He does not want to take a statin. He says he will take a baby aspirin when he is cleared by his  neurosurgeon.  Tobacco use We discussed stopping this pretty sure he won't. He smokes cigarettes and uses smokeless tobacco.

## 2012-09-09 NOTE — Telephone Encounter (Signed)
New Problem:    Patient cannot perform his Dobutamine ECHO because he is unable to walk. Please call back.

## 2012-09-09 NOTE — Patient Instructions (Addendum)
The current medical regimen is effective;  continue present plan and medications.  Your physician has requested that you have a dobutamine echocardiogram. For further information please visit [email or web address]. Please follow instruction sheet as given.  Follow up will be based on results of testing.   Surgical clearance should be sent to Dr Ashok Pall  [facility] [phone] (fax #)

## 2012-09-18 ENCOUNTER — Other Ambulatory Visit (HOSPITAL_COMMUNITY): Payer: BC Managed Care – PPO

## 2012-09-18 ENCOUNTER — Ambulatory Visit (HOSPITAL_COMMUNITY): Payer: BC Managed Care – PPO | Attending: Cardiology

## 2012-09-18 ENCOUNTER — Other Ambulatory Visit (HOSPITAL_COMMUNITY): Payer: Self-pay | Admitting: Cardiology

## 2012-09-18 ENCOUNTER — Ambulatory Visit (HOSPITAL_COMMUNITY): Payer: BC Managed Care – PPO | Attending: Cardiology | Admitting: Radiology

## 2012-09-18 DIAGNOSIS — F17200 Nicotine dependence, unspecified, uncomplicated: Secondary | ICD-10-CM | POA: Insufficient documentation

## 2012-09-18 DIAGNOSIS — I2510 Atherosclerotic heart disease of native coronary artery without angina pectoris: Secondary | ICD-10-CM

## 2012-09-18 DIAGNOSIS — I25810 Atherosclerosis of coronary artery bypass graft(s) without angina pectoris: Secondary | ICD-10-CM

## 2012-09-18 DIAGNOSIS — R0989 Other specified symptoms and signs involving the circulatory and respiratory systems: Secondary | ICD-10-CM

## 2012-09-18 DIAGNOSIS — I509 Heart failure, unspecified: Secondary | ICD-10-CM | POA: Insufficient documentation

## 2012-09-18 DIAGNOSIS — Z01810 Encounter for preprocedural cardiovascular examination: Secondary | ICD-10-CM | POA: Insufficient documentation

## 2012-09-18 DIAGNOSIS — R079 Chest pain, unspecified: Secondary | ICD-10-CM

## 2012-09-18 MED ORDER — DOBUTAMINE HCL 250 MG/20ML IV SOLN
40.0000 ug/kg | Freq: Once | INTRAVENOUS | Status: AC
  Administered 2012-09-18: 40 ug/kg/min via INTRAVENOUS

## 2012-09-18 MED ORDER — PERFLUTREN PROTEIN A MICROSPH IV SUSP
5.0000 mL | Freq: Once | INTRAVENOUS | Status: AC
  Administered 2012-09-18: 5 mL via INTRAVENOUS

## 2012-09-18 MED ORDER — ATROPINE SULFATE 0.1 MG/ML IJ SOLN
0.7500 mg | Freq: Once | INTRAMUSCULAR | Status: AC
  Administered 2012-09-18: 0.75 mg via INTRAVENOUS

## 2012-09-18 NOTE — Progress Notes (Signed)
Dobutamine Stress Echocardiogram performed.  

## 2012-09-23 ENCOUNTER — Other Ambulatory Visit: Payer: Self-pay | Admitting: Neurosurgery

## 2012-09-26 ENCOUNTER — Encounter (HOSPITAL_COMMUNITY): Payer: Self-pay | Admitting: Pharmacy Technician

## 2012-10-04 ENCOUNTER — Encounter (HOSPITAL_COMMUNITY)
Admission: RE | Admit: 2012-10-04 | Discharge: 2012-10-04 | Disposition: A | Discharge status: Home / Self Care | Payer: BC Managed Care – PPO | Source: Ambulatory Visit | Attending: Neurosurgery | Admitting: Neurosurgery

## 2012-10-04 ENCOUNTER — Ambulatory Visit (HOSPITAL_COMMUNITY)
Admission: RE | Admit: 2012-10-04 | Discharge: 2012-10-04 | Disposition: A | Discharge status: Home / Self Care | Payer: BC Managed Care – PPO | Source: Ambulatory Visit | Attending: Neurosurgery | Admitting: Neurosurgery

## 2012-10-04 ENCOUNTER — Encounter (HOSPITAL_COMMUNITY): Payer: Self-pay

## 2012-10-04 DIAGNOSIS — Z01812 Encounter for preprocedural laboratory examination: Secondary | ICD-10-CM | POA: Insufficient documentation

## 2012-10-04 DIAGNOSIS — I517 Cardiomegaly: Secondary | ICD-10-CM | POA: Insufficient documentation

## 2012-10-04 DIAGNOSIS — D333 Benign neoplasm of cranial nerves: Secondary | ICD-10-CM | POA: Insufficient documentation

## 2012-10-04 HISTORY — DX: Pilonidal cyst without abscess: L05.91

## 2012-10-04 HISTORY — DX: Shortness of breath: R06.02

## 2012-10-04 HISTORY — DX: Heart failure, unspecified: I50.9

## 2012-10-04 LAB — BASIC METABOLIC PANEL
CO2: 26 mEq/L (ref 19–32)
Calcium: 9.8 mg/dL (ref 8.4–10.5)
Creatinine, Ser: 0.89 mg/dL (ref 0.50–1.35)
GFR calc non Af Amer: >90 mL/min (ref >90)

## 2012-10-04 LAB — TYPE AND SCREEN: ABO/RH(D): O POS

## 2012-10-04 LAB — CBC
MCH: 30.5 pg (ref 26.0–34.0)
MCV: 90.1 fL (ref 78.0–100.0)
Platelets: 294 10*3/uL (ref 150–400)
RDW: 13.3 % (ref 11.5–15.5)
WBC: 10.7 10*3/uL — ABNORMAL HIGH (ref 4.0–10.5)

## 2012-10-04 LAB — SURGICAL PCR SCREEN: Staphylococcus aureus: NEGATIVE

## 2012-10-04 NOTE — Consult Note (Signed)
Anesthesia Chart Review:  Patient is a 52 year old male scheduled for craniotomy for acoustic neuroma on 10/11/12 by Dr. Christella Noa and Dr. Benjamine Mola.  History includes CAD s/p CABG (LIMA to LAD) '09, ischemic cardiomyopathy with normal EF by 08/2012 stress echo, CHF, HTN, morbid obesity, fibromyalgia, smoker.    Cardiologist is Dr. Percival Spanish, who saw him on 09/09/12 for preoperative evaluation.  EKG then showed NSR, left posterior fascicular block. He ordered a dobutamine stress echocardiogram which was done on 09/18/12 and showed normal wall motion, no LV regional wall motion abnormalities, and normal EF at rest and stress (baseline EF 55%, at peak 75%). Results reviewed by Dr. Percival Spanish with the following recommendations, "Limited views. However, no high risk findings. No acute symptoms on going. Therefore, based on ACC/AHA guidelines, the patient would be at acceptable risk for the planned procedure without further cardiovascular testing."  His last cardiac cath was pre-CABG on 12/10/07 showing 80% stenosis of the bifurcation of the diagonal and LAD followed by subtotal occlusion of LAD with large thrombus burden followed by 70-80% LAD stenosis, minor luminal irregularities of the LCX, OM!, PL branch.  28-30% proximal RCA and 40-50% proximal PDA.  EF 45% with anterior wall hypokinesis and no significant MR.  CXR on 10/04/12 showed stable cardiomegaly, improved basilar atelectasis.  Preoperative labs noted.  Patient will be evaluated by his assigned anesthesiologist on the day of surgery to discuss the definitive anesthesia plan. He has been cleared by his cardiologist, so if no new changes would anticipate he could proceed as planned.  Myra Gianotti, PA-C 10/04/12 1223

## 2012-10-04 NOTE — Progress Notes (Deleted)
[facility] called for cath,stress test,ekg results done this yr.

## 2012-10-04 NOTE — Pre-Procedure Instructions (Signed)
KEIRON IACOBELLI  10/04/2012   Your procedure is scheduled on:  10/11/12  Report to [facility] at 530 AM.  Call this number if you have problems the morning of surgery: [phone]   Remember:   Do not eat food or drink liquids after midnight.   Take these medicines the morning of surgery with A SIP OF WATER: tenormin,vicodan   Do not wear jewelry, make-up or nail polish.  Do not wear lotions, powders, or perfumes. You may wear deodorant.  Do not shave 48 hours prior to surgery. Men may shave face and neck.  Do not bring valuables to the hospital.  Contacts, dentures or bridgework may not be worn into surgery.  Leave suitcase in the car. After surgery it may be brought to your room.  For patients admitted to the hospital, checkout time is 11:00 AM the day of  discharge.   Patients discharged the day of surgery will not be allowed to drive  home.  Name and phone number of your driver: family  Special Instructions: Shower using CHG 2 nights before surgery and the night before surgery.  If you shower the day of surgery use CHG.  Use special wash - you have one bottle of CHG for all showers.  You should use approximately 1/3 of the bottle for each shower.   Please read over the following fact sheets that you were given: Pain Booklet, Coughing and Deep Breathing, Blood Transfusion Information, MRSA Information and Surgical Site Infection Prevention

## 2012-10-10 MED ORDER — VANCOMYCIN HCL 10 G IV SOLR
1500.0000 mg | INTRAVENOUS | Status: AC
  Administered 2012-10-11: 1500 mg via INTRAVENOUS
  Filled 2012-10-10: qty 1500

## 2012-10-10 MED ORDER — VANCOMYCIN HCL IN DEXTROSE 1-5 GM/200ML-% IV SOLN: 1000.0000 mg | INTRAVENOUS | Status: DC

## 2012-10-10 NOTE — Progress Notes (Signed)
Pt called to ask which medicines to take the am of surgery.  I instructed him to take his tenormin as instructed in PAT and that he could take his vicodin also if needed..  Pt cal;led back to ask what to wear to the hospital. Instructed him to wear something comfortable and he may want to being something that buttons down the front to wear home so he won't have to pull it over his head.

## 2012-10-11 ENCOUNTER — Inpatient Hospital Stay (HOSPITAL_COMMUNITY)
Admission: RE | Admit: 2012-10-11 | Discharge: 2012-10-22 | DRG: 877 | Disposition: A | Discharge status: Rehabilitation Facility | Payer: BC Managed Care – PPO | Source: Ambulatory Visit | Attending: Neurosurgery | Admitting: Neurosurgery

## 2012-10-11 ENCOUNTER — Encounter (HOSPITAL_COMMUNITY): Payer: Self-pay | Admitting: *Deleted

## 2012-10-11 ENCOUNTER — Encounter (HOSPITAL_COMMUNITY): Payer: Self-pay | Admitting: Vascular Surgery

## 2012-10-11 ENCOUNTER — Inpatient Hospital Stay (HOSPITAL_COMMUNITY): Payer: BC Managed Care – PPO | Admitting: Anesthesiology

## 2012-10-11 ENCOUNTER — Encounter (HOSPITAL_COMMUNITY)
Admission: RE | Disposition: A | Discharge status: Rehabilitation Facility | Payer: Self-pay | Source: Ambulatory Visit | Attending: Neurosurgery

## 2012-10-11 DIAGNOSIS — J810 Acute pulmonary edema: Secondary | ICD-10-CM | POA: Diagnosis not present

## 2012-10-11 DIAGNOSIS — I2589 Other forms of chronic ischemic heart disease: Secondary | ICD-10-CM | POA: Diagnosis present

## 2012-10-11 DIAGNOSIS — I25810 Atherosclerosis of coronary artery bypass graft(s) without angina pectoris: Secondary | ICD-10-CM

## 2012-10-11 DIAGNOSIS — IMO0001 Reserved for inherently not codable concepts without codable children: Secondary | ICD-10-CM | POA: Diagnosis present

## 2012-10-11 DIAGNOSIS — Z8249 Family history of ischemic heart disease and other diseases of the circulatory system: Secondary | ICD-10-CM

## 2012-10-11 DIAGNOSIS — I10 Essential (primary) hypertension: Secondary | ICD-10-CM | POA: Diagnosis present

## 2012-10-11 DIAGNOSIS — Z9889 Other specified postprocedural states: Secondary | ICD-10-CM

## 2012-10-11 DIAGNOSIS — F17200 Nicotine dependence, unspecified, uncomplicated: Secondary | ICD-10-CM | POA: Diagnosis present

## 2012-10-11 DIAGNOSIS — G935 Compression of brain: Secondary | ICD-10-CM | POA: Diagnosis present

## 2012-10-11 DIAGNOSIS — G4733 Obstructive sleep apnea (adult) (pediatric): Secondary | ICD-10-CM | POA: Diagnosis present

## 2012-10-11 DIAGNOSIS — Z880 Allergy status to penicillin: Secondary | ICD-10-CM

## 2012-10-11 DIAGNOSIS — Z6841 Body Mass Index (BMI) 40.0 and over, adult: Secondary | ICD-10-CM

## 2012-10-11 DIAGNOSIS — Z930 Tracheostomy status: Secondary | ICD-10-CM

## 2012-10-11 DIAGNOSIS — E870 Hyperosmolality and hypernatremia: Secondary | ICD-10-CM | POA: Diagnosis not present

## 2012-10-11 DIAGNOSIS — R269 Unspecified abnormalities of gait and mobility: Secondary | ICD-10-CM | POA: Diagnosis present

## 2012-10-11 DIAGNOSIS — G510 Bell's palsy: Secondary | ICD-10-CM | POA: Diagnosis present

## 2012-10-11 DIAGNOSIS — R1319 Other dysphagia: Secondary | ICD-10-CM | POA: Diagnosis not present

## 2012-10-11 DIAGNOSIS — R262 Difficulty in walking, not elsewhere classified: Secondary | ICD-10-CM | POA: Diagnosis present

## 2012-10-11 DIAGNOSIS — T82218A Other mechanical complication of coronary artery bypass graft, initial encounter: Secondary | ICD-10-CM

## 2012-10-11 DIAGNOSIS — E119 Type 2 diabetes mellitus without complications: Secondary | ICD-10-CM | POA: Diagnosis present

## 2012-10-11 DIAGNOSIS — D333 Benign neoplasm of cranial nerves: Principal | ICD-10-CM | POA: Diagnosis present

## 2012-10-11 DIAGNOSIS — Z79899 Other long term (current) drug therapy: Secondary | ICD-10-CM

## 2012-10-11 DIAGNOSIS — I252 Old myocardial infarction: Secondary | ICD-10-CM

## 2012-10-11 DIAGNOSIS — I509 Heart failure, unspecified: Secondary | ICD-10-CM | POA: Diagnosis present

## 2012-10-11 DIAGNOSIS — I2510 Atherosclerotic heart disease of native coronary artery without angina pectoris: Secondary | ICD-10-CM | POA: Diagnosis present

## 2012-10-11 DIAGNOSIS — Z888 Allergy status to other drugs, medicaments and biological substances status: Secondary | ICD-10-CM

## 2012-10-11 DIAGNOSIS — Z885 Allergy status to narcotic agent status: Secondary | ICD-10-CM

## 2012-10-11 DIAGNOSIS — J95821 Acute postprocedural respiratory failure: Secondary | ICD-10-CM | POA: Diagnosis not present

## 2012-10-11 DIAGNOSIS — E66813 Obesity, class 3: Secondary | ICD-10-CM | POA: Diagnosis present

## 2012-10-11 DIAGNOSIS — Z951 Presence of aortocoronary bypass graft: Secondary | ICD-10-CM

## 2012-10-11 HISTORY — DX: Type 2 diabetes mellitus without complications: E11.9

## 2012-10-11 HISTORY — DX: Pilonidal cyst without abscess: L05.91

## 2012-10-11 HISTORY — PX: ACOUSTIC NEUROMA RESECTION: SHX5713

## 2012-10-11 HISTORY — PX: RETROSIGMOID CRANIECTOMY FOR TUMOR RESECTION: SHX6072

## 2012-10-11 HISTORY — PX: TRACHEOSTOMY TUBE PLACEMENT: SHX814

## 2012-10-11 HISTORY — DX: Calculus of kidney: N20.0

## 2012-10-11 SURGERY — RETROSIGMOID CRANIECTOMY FOR TUMOR RESECTION
Anesthesia: General | Site: Neck | Laterality: Right | Wound class: Clean

## 2012-10-11 MED ORDER — LABETALOL HCL 5 MG/ML IV SOLN
INTRAVENOUS | Status: DC | PRN
  Administered 2012-10-11 (×4): 2.5 mg via INTRAVENOUS
  Administered 2012-10-11 (×2): 5 mg via INTRAVENOUS

## 2012-10-11 MED ORDER — LACTATED RINGERS IV SOLN
INTRAVENOUS | Status: DC | PRN
  Administered 2012-10-11 (×4): via INTRAVENOUS

## 2012-10-11 MED ORDER — PHENYLEPHRINE HCL 10 MG/ML IJ SOLN
INTRAMUSCULAR | Status: DC | PRN
  Administered 2012-10-11: 80 ug via INTRAVENOUS

## 2012-10-11 MED ORDER — MICROFIBRILLAR COLL HEMOSTAT EX PADS
MEDICATED_PAD | CUTANEOUS | Status: DC | PRN
  Administered 2012-10-11: 1 via TOPICAL

## 2012-10-11 MED ORDER — FENTANYL CITRATE 0.05 MG/ML IJ SOLN
INTRAMUSCULAR | Status: DC | PRN
  Administered 2012-10-11 (×3): 50 ug via INTRAVENOUS
  Administered 2012-10-11: 25 ug via INTRAVENOUS
  Administered 2012-10-11 (×12): 50 ug via INTRAVENOUS
  Administered 2012-10-11: 25 ug via INTRAVENOUS
  Administered 2012-10-11 (×7): 50 ug via INTRAVENOUS

## 2012-10-11 MED ORDER — MICROFIBRILLAR COLL HEMOSTAT EX PADS: MEDICATED_PAD | CUTANEOUS | Status: DC | PRN

## 2012-10-11 MED ORDER — SODIUM CHLORIDE 0.9 % IV SOLN
INTRAVENOUS | Status: AC
  Filled 2012-10-11: qty 500

## 2012-10-11 MED ORDER — SODIUM CHLORIDE 0.9 % IR SOLN
Status: DC | PRN
  Administered 2012-10-11 (×4): 1000 mL

## 2012-10-11 MED ORDER — THROMBIN 20000 UNITS EX SOLR
CUTANEOUS | Status: DC | PRN
  Administered 2012-10-11: 11:00:00 via TOPICAL

## 2012-10-11 MED ORDER — BACITRACIN 50000 UNITS IM SOLR
INTRAMUSCULAR | Status: AC
  Filled 2012-10-11: qty 1

## 2012-10-11 MED ORDER — SODIUM CHLORIDE 0.9 % IR SOLN
Status: DC | PRN
  Administered 2012-10-11 (×2): 1000 mL

## 2012-10-11 MED ORDER — SODIUM CHLORIDE 0.9 % IV SOLN
INTRAVENOUS | Status: DC | PRN
  Administered 2012-10-11 (×3): via INTRAVENOUS

## 2012-10-11 MED ORDER — SODIUM CHLORIDE 0.9 % IV SOLN
10.0000 mg | INTRAVENOUS | Status: DC | PRN
  Administered 2012-10-11: 40 ug/min via INTRAVENOUS

## 2012-10-11 MED ORDER — LIDOCAINE HCL (CARDIAC) 20 MG/ML IV SOLN
INTRAVENOUS | Status: DC | PRN
  Administered 2012-10-11: 100 mg via INTRAVENOUS

## 2012-10-11 MED ORDER — EPHEDRINE SULFATE 50 MG/ML IJ SOLN
INTRAMUSCULAR | Status: DC | PRN
  Administered 2012-10-11: 10 mg via INTRAVENOUS
  Administered 2012-10-11: 5 mg via INTRAVENOUS
  Administered 2012-10-11 (×2): 10 mg via INTRAVENOUS

## 2012-10-11 MED ORDER — DEXAMETHASONE SODIUM PHOSPHATE 10 MG/ML IJ SOLN
INTRAMUSCULAR | Status: DC | PRN
  Administered 2012-10-11 (×2): 10 mg via INTRAVENOUS

## 2012-10-11 MED ORDER — LIDOCAINE-EPINEPHRINE 1 %-1:100000 IJ SOLN
INTRAMUSCULAR | Status: DC | PRN
  Administered 2012-10-11: 4 mL via INTRADERMAL

## 2012-10-11 MED ORDER — PROPOFOL 10 MG/ML IV BOLUS
INTRAVENOUS | Status: DC | PRN
  Administered 2012-10-11: 20 mg via INTRAVENOUS
  Administered 2012-10-11: 200 mg via INTRAVENOUS
  Administered 2012-10-11: 20 mg via INTRAVENOUS
  Administered 2012-10-11 (×2): 100 mg via INTRAVENOUS

## 2012-10-11 MED ORDER — GLYCOPYRROLATE 0.2 MG/ML IJ SOLN
INTRAMUSCULAR | Status: DC | PRN
  Administered 2012-10-11 (×2): 0.2 mg via INTRAVENOUS

## 2012-10-11 MED ORDER — LIDOCAINE HCL 4 % MT SOLN
OROMUCOSAL | Status: DC | PRN
  Administered 2012-10-11: 4 mL via TOPICAL

## 2012-10-11 MED ORDER — SUCCINYLCHOLINE CHLORIDE 20 MG/ML IJ SOLN
INTRAMUSCULAR | Status: DC | PRN
  Administered 2012-10-11: 100 mg via INTRAVENOUS

## 2012-10-11 MED ORDER — SODIUM BICARBONATE 4.2 % IV SOLN
INTRAVENOUS | Status: DC | PRN
  Administered 2012-10-11: 100 meq via INTRAVENOUS

## 2012-10-11 SURGICAL SUPPLY — 132 items
2.0MM TAPERED ROUND DIAMOND ELITE BUR ×4 IMPLANT
BAG SALINE 0.9% ×8 IMPLANT
BANDAGE GAUZE ELAST BULKY 4 IN (GAUZE/BANDAGES/DRESSINGS) ×4 IMPLANT
BLADE EYE SICKLE 84 5 BEAV (BLADE) ×4 IMPLANT
BLADE SURG 11 STRL SS (BLADE) IMPLANT
BLADE SURG 15 STRL LF DISP TIS (BLADE) ×3 IMPLANT
BLADE SURG 15 STRL SS (BLADE) ×1
BLADE SURG ROTATE 9660 (MISCELLANEOUS) ×4 IMPLANT
BLADE ULTRA TIP 2M (BLADE) IMPLANT
BRUSH SCRUB EZ 1% IODOPHOR (MISCELLANEOUS) IMPLANT
BUR ACORN 6.0 PRECISION (BURR) ×4 IMPLANT
BUR DIAMOND COARSE 2.0 (BURR) IMPLANT
BUR ROUND FLUTED 5 RND (BURR) ×4 IMPLANT
BUR SABER DIAMOND 3.0 (BURR) ×4 IMPLANT
BUR SABER RD CUTTING 3.0 (BURR) IMPLANT
CANISTER SUCTION 2500CC (MISCELLANEOUS) ×12 IMPLANT
CATH VENTRIC 35X38 W/TROCAR LG (CATHETERS) IMPLANT
CLAW UNIV SL SPETZLER MIC (INSTRUMENTS) ×4 IMPLANT
CLEANER TIP ELECTROSURG 2X2 (MISCELLANEOUS) ×8 IMPLANT
CLIP TI MEDIUM 6 (CLIP) IMPLANT
CLOTH BEACON ORANGE TIMEOUT ST (SAFETY) ×12 IMPLANT
CONT SPEC 4OZ CLIKSEAL STRL BL (MISCELLANEOUS) ×4 IMPLANT
CORDS BIPOLAR (ELECTRODE) ×4 IMPLANT
COVER MAYO STAND STRL (DRAPES) ×4 IMPLANT
COVER SURGICAL LIGHT HANDLE (MISCELLANEOUS) IMPLANT
COVER TABLE BACK 60X90 (DRAPES) IMPLANT
DECANTER SPIKE VIAL GLASS SM (MISCELLANEOUS) ×12 IMPLANT
DERMABOND ADVANCED (GAUZE/BANDAGES/DRESSINGS) ×2
DERMABOND ADVANCED .7 DNX12 (GAUZE/BANDAGES/DRESSINGS) ×6 IMPLANT
DISPOSABLE IRRIGATION CASSETT ×4 IMPLANT
DRAIN SNY WOU 7FLT (WOUND CARE) IMPLANT
DRAPE MICROSCOPE LEICA (MISCELLANEOUS) ×8 IMPLANT
DRAPE NEUROLOGICAL W/INCISE (DRAPES) ×8 IMPLANT
DRAPE POUCH INSTRU U-SHP 10X18 (DRAPES) ×4 IMPLANT
DRAPE PROXIMA HALF (DRAPES) ×4 IMPLANT
DRAPE WARM FLUID 44X44 (DRAPE) ×4 IMPLANT
DRESSING TELFA 8X3 (GAUZE/BANDAGES/DRESSINGS) ×4 IMPLANT
DRSG GLASSCOCK MASTOID ADT (GAUZE/BANDAGES/DRESSINGS) IMPLANT
DRSG OPSITE 4X5.5 SM (GAUZE/BANDAGES/DRESSINGS) IMPLANT
DURAGUARD 04CMX04CM ×4 IMPLANT
DURAPREP 26ML APPLICATOR (WOUND CARE) IMPLANT
DURASEAL SPINE SEALANT 3ML (MISCELLANEOUS) ×4 IMPLANT
ELECT CAUTERY BLADE 6.4 (BLADE) ×4 IMPLANT
ELECT COATED BLADE 2.86 ST (ELECTRODE) ×8 IMPLANT
ELECT REM PT RETURN 9FT ADLT (ELECTROSURGICAL) ×12
ELECTRODE REM PT RTRN 9FT ADLT (ELECTROSURGICAL) ×9 IMPLANT
EVACUATOR 1/8 PVC DRAIN (DRAIN) IMPLANT
EVACUATOR SILICONE 100CC (DRAIN) IMPLANT
FORCEPS BIPOLAR SPETZLER 8 1.0 (NEUROSURGERY SUPPLIES) ×4 IMPLANT
GAUZE SPONGE 4X4 16PLY XRAY LF (GAUZE/BANDAGES/DRESSINGS) ×12 IMPLANT
GAUZE XEROFORM 5X9 LF (GAUZE/BANDAGES/DRESSINGS) IMPLANT
GLOVE BIO SURGEON STRL SZ 6.5 (GLOVE) ×12 IMPLANT
GLOVE BIO SURGEON STRL SZ7.5 (GLOVE) ×12 IMPLANT
GLOVE BIOGEL M 8.0 STRL (GLOVE) ×4 IMPLANT
GLOVE BIOGEL PI IND STRL 6.5 (GLOVE) ×3 IMPLANT
GLOVE BIOGEL PI IND STRL 7.5 (GLOVE) ×3 IMPLANT
GLOVE BIOGEL PI IND STRL 8.5 (GLOVE) ×3 IMPLANT
GLOVE BIOGEL PI INDICATOR 6.5 (GLOVE) ×1
GLOVE BIOGEL PI INDICATOR 7.5 (GLOVE) ×1
GLOVE BIOGEL PI INDICATOR 8.5 (GLOVE) ×1
GLOVE ECLIPSE 6.5 STRL STRAW (GLOVE) ×20 IMPLANT
GLOVE ECLIPSE 7.5 STRL STRAW (GLOVE) ×12 IMPLANT
GLOVE EXAM NITRILE LRG STRL (GLOVE) IMPLANT
GLOVE EXAM NITRILE MD LF STRL (GLOVE) IMPLANT
GLOVE EXAM NITRILE XL STR (GLOVE) IMPLANT
GLOVE EXAM NITRILE XS STR PU (GLOVE) IMPLANT
GLOVE OPTIFIT SS 6.5 STRL BRWN (GLOVE) ×4 IMPLANT
GLOVE SURG SS PI 6.5 STRL IVOR (GLOVE) ×8 IMPLANT
GLOVE SURG SS PI 8.5 STRL IVOR (GLOVE) ×1
GLOVE SURG SS PI 8.5 STRL STRW (GLOVE) ×3 IMPLANT
GOWN BRE IMP SLV AUR LG STRL (GOWN DISPOSABLE) ×24 IMPLANT
GOWN BRE IMP SLV AUR XL STRL (GOWN DISPOSABLE) ×8 IMPLANT
GOWN STRL NON-REIN LRG LVL3 (GOWN DISPOSABLE) ×20 IMPLANT
GOWN STRL REIN 2XL LVL4 (GOWN DISPOSABLE) ×20 IMPLANT
HEMOSTAT SURGICEL 2X14 (HEMOSTASIS) ×4 IMPLANT
HOLDER TRACH TUBE VELCRO 19.5 (MISCELLANEOUS) IMPLANT
KIT BASIN OR (CUSTOM PROCEDURE TRAY) ×12 IMPLANT
KIT DRAIN CSF ACCUDRAIN (MISCELLANEOUS) IMPLANT
KIT ROOM TURNOVER OR (KITS) ×12 IMPLANT
KIT SUCTION CATH 14FR (SUCTIONS) IMPLANT
KNIFE ARACHNOID DISP AM-21-S (BLADE) ×4 IMPLANT
MARKER SKIN DUAL TIP RULER LAB (MISCELLANEOUS) IMPLANT
NEEDLE HYPO 25GX1X1/2 BEV (NEEDLE) ×8 IMPLANT
NEEDLE HYPO 25X1 1.5 SAFETY (NEEDLE) ×4 IMPLANT
NEEDLE SPNL 18GX3.5 QUINCKE PK (NEEDLE) IMPLANT
NS IRRIG 1000ML POUR BTL (IV SOLUTION) ×16 IMPLANT
PACK CRANIOTOMY (CUSTOM PROCEDURE TRAY) ×4 IMPLANT
PACK EENT II TURBAN DRAPE (CUSTOM PROCEDURE TRAY) ×4 IMPLANT
PAD ARMBOARD 7.5X6 YLW CONV (MISCELLANEOUS) ×28 IMPLANT
PATTIES SURGICAL .5 X.5 (GAUZE/BANDAGES/DRESSINGS) ×4 IMPLANT
PATTIES SURGICAL .5 X1 (DISPOSABLE) ×4 IMPLANT
PATTIES SURGICAL .5 X3 (DISPOSABLE) ×4 IMPLANT
PATTIES SURGICAL 1/4 X 3 (GAUZE/BANDAGES/DRESSINGS) ×4 IMPLANT
PATTIES SURGICAL 1X1 (DISPOSABLE) IMPLANT
PENCIL BUTTON HOLSTER BLD 10FT (ELECTRODE) ×8 IMPLANT
PIN MAYFIELD SKULL DISP (PIN) IMPLANT
RUBBERBAND STERILE (MISCELLANEOUS) ×16 IMPLANT
SPONGE DRAIN TRACH 4X4 STRL 2S (GAUZE/BANDAGES/DRESSINGS) ×4 IMPLANT
SPONGE GAUZE 4X4 12PLY (GAUZE/BANDAGES/DRESSINGS) ×4 IMPLANT
SPONGE INTESTINAL PEANUT (DISPOSABLE) IMPLANT
SPONGE NEURO XRAY DETECT 1X3 (DISPOSABLE) IMPLANT
SPONGE SURGIFOAM ABS GEL 100 (HEMOSTASIS) IMPLANT
SPONGE SURGIFOAM ABS GEL 12-7 (HEMOSTASIS) ×4 IMPLANT
STAPLER SKIN PROX WIDE 3.9 (STAPLE) ×4 IMPLANT
SUT BONE WAX W31G (SUTURE) ×4 IMPLANT
SUT CHROMIC 3 0 SH 27 (SUTURE) ×4 IMPLANT
SUT ETHILON 3 0 FSL (SUTURE) IMPLANT
SUT NURALON 4 0 TR CR/8 (SUTURE) ×12 IMPLANT
SUT SILK 2 0 FS (SUTURE) ×4 IMPLANT
SUT SILK 2 0 SH (SUTURE) ×4 IMPLANT
SUT SILK 3 0 (SUTURE) ×1
SUT SILK 3-0 18XBRD TIE 12 (SUTURE) ×3 IMPLANT
SUT VIC AB 2-0 CT1 18 (SUTURE) ×8 IMPLANT
SUT VIC AB 3-0 FS2 27 (SUTURE) ×4 IMPLANT
SUT VIC AB 3-0 SH 8-18 (SUTURE) ×4 IMPLANT
SYR 20CC LL (SYRINGE) ×4 IMPLANT
SYR 20ML ECCENTRIC (SYRINGE) ×4 IMPLANT
SYR BULB 3OZ (MISCELLANEOUS) IMPLANT
SYR CONTROL 10ML LL (SYRINGE) ×8 IMPLANT
TAPE UMBILICAL 1/8 X36 TWILL (MISCELLANEOUS) ×4 IMPLANT
TIP BSPETZLER BARACUDA 25KHZ (INSTRUMENTS) ×4 IMPLANT
TIP STRAIGHT 25KHZ (INSTRUMENTS) ×4 IMPLANT
TOWEL OR 17X24 6PK STRL BLUE (TOWEL DISPOSABLE) ×8 IMPLANT
TOWEL OR 17X26 10 PK STRL BLUE (TOWEL DISPOSABLE) ×12 IMPLANT
TRACHEOSTOMY TUBE XLT CUFFED IMPLANT
TRAY ENT MC OR (CUSTOM PROCEDURE TRAY) ×4 IMPLANT
TRAY FOLEY CATH 14FRSI W/METER (CATHETERS) IMPLANT
TUBE CONNECTING 12X1/4 (SUCTIONS) ×8 IMPLANT
TUBE TRACH SHILEY 8 DIST CUF (TUBING) ×4 IMPLANT
UNDERPAD 30X30 INCONTINENT (UNDERPADS AND DIAPERS) IMPLANT
WATER STERILE IRR 1000ML POUR (IV SOLUTION) ×16 IMPLANT
WIPE INSTRUMENT VISIWIPE 73X73 (MISCELLANEOUS) ×4 IMPLANT

## 2012-10-11 NOTE — Preoperative (Signed)
Beta Blockers   Reason not to administer Beta Blockers:Not Applicable 

## 2012-10-11 NOTE — OR Nursing (Signed)
Three procedures took place: a tracheostomy, craniotomy, and an acoustic neuroma resection. The tracheostomy started at [phone] and ended at [phone] with Dr. Benjamine Mola. Approximately one hour elapsed while numerous staff members helped re-position and set up the room for the craniotomy. Due to the patient's large size, positioning took additional time. The craniotomy began at 1007 with Dr. Christella Noa.

## 2012-10-11 NOTE — Anesthesia Preprocedure Evaluation (Signed)
Anesthesia Evaluation  Patient identified by MRN, date of birth, ID band Patient awake    Reviewed: Allergy & Precautions, H&P , NPO status , Patient's Chart, lab work & pertinent test results  Airway Mallampati: II TM Distance: >3 FB Neck ROM: full    Dental   Pulmonary          Cardiovascular hypertension, + CAD, + CABG and +CHF Rhythm:regular Rate:Normal     Neuro/Psych  Neuromuscular disease    GI/Hepatic   Endo/Other  diabetes, Type 2Morbid obesity  Renal/GU      Musculoskeletal  (+) Fibromyalgia -  Abdominal   Peds  Hematology   Anesthesia Other Findings   Reproductive/Obstetrics                           Anesthesia Physical Anesthesia Plan  ASA: IV  Anesthesia Plan: General   Post-op Pain Management:    Induction: Intravenous  Airway Management Planned: Tracheostomy and Oral ETT  Additional Equipment: Arterial line  Intra-op Plan:   Post-operative Plan: Possible Post-op intubation/ventilation  Informed Consent: I have reviewed the patients History and Physical, chart, labs and discussed the procedure including the risks, benefits and alternatives for the proposed anesthesia with the patient or authorized representative who has indicated his/her understanding and acceptance.     Plan Discussed with: CRNA, Anesthesiologist and Surgeon  Anesthesia Plan Comments:         Anesthesia Quick Evaluation

## 2012-10-11 NOTE — H&P (Signed)
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 The patient is seen in consultation requested by Dr. Ashok Pall.  According to the patient, he developed significant difficulty with his balance and coordination approximately 3 months ago.  It has progressively worsened.  Over the past month, he has been wheelchair-bound.  He has also developed multiple other neurologic problems, including difficulty using his hands, hoarseness, urinary frequency, and right-sided hearing loss. The patient reports his asymmetric right-sided hearing loss has been present for more than 1 year.  It has progressively worsened.  Currently, he reports no useful hearing on the right side.   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 The MRI results were consistent with a large right acoustic neuroma.  The acoustic neuroma was noted to cause significant brainstem compression. As a result, the patient was recently seen by Dr. Christella Noa and surgical intervention was recommended.  It should be noted the patient is morbidly obese.  He previously underwent open heart surgery in 2009.  He has no previous history of sleep study.  He certainly sleeps on an incline.  He is unable to lay flat due to respiratory difficulty.   The patient's review of systems (constitutional, eyes, ENT, cardiovascular, respiratory, GI, musculoskeletal, skin, neurologic, psychiatric, endocrine, hematologic, allergic) is noted in the ROS questionnaire.  It is reviewed with the patient.    Past Medical History (Major events, hospitalizations, surgeries):  Failed coronary artery bypass graft, Myocardial infarction.     Known allergies: Penicillin, Stereoids, Morphine.     Ongoing medical problems: Coronary artery disease, Morbid obesity, Hypertension.     Family medical history:  None.     Social history: The patient is married. He smokes few cigarettes a day. He uses alcohol alcohol. He denies the use of illegal drugs.  Exam: General: Communicates without difficulty, well nourished, no acute distress.  Morbidly obese.  Head: Normocephalic, no evidence injury, no tenderness, facial buttresses intact without stepoff.  Eyes: PERRL, EOMI. No scleral icterus, conjunctivae clear.  Neuro: CN II exam reveals vision grossly intact.  Positive nystagmus at right lateral gaze.  Mild right facial weakness with asymmetry on facial movement.  Ears: Auricles well formed without lesions.  Ear canals are intact without mass or lesion.  No erythema or edema is appreciated.  The TMs are intact without fluid.  Nose: External evaluation reveals normal support and skin without lesions.  Dorsum is intact.  Anterior rhinoscopy reveals healthy pink mucosa over anterior aspect of inferior turbinates and intact septum.  No purulence noted.  Oral:  Oral cavity and oropharynx are intact, symmetric, without erythema or edema.  Mucosa is moist without lesions.  Neck: Full range of motion without pain.  There is no significant lymphadenopathy.  No masses palpable.  Thyroid bed within normal limits to palpation.  Parotid glands and submandibular glands equal bilaterally without mass.  Trachea is midline.  Difficult to palpate the laryngeal framework due to his obesity.  His voice is hoarse.  He is wheelchair bound.  Gait could not be evaluated.  AUDIOMETRIC TESTING:  Shows profound hearing loss on the right, with bordeline normal hearing on the left.  The speech reception threshold is 95dB AD and 25dB AS.  The discrimination score is CNT AD and 92% AS.  The tympanogram is normal bilaterally.  A: 1.  The patient  has a large right acoustic neuroma, causing significant right brainstem compression and multiple cranial nerve palsies.  2.  The patient has no useful hearing on the right side.  He has profound hearing  loss on the right side, with near normal hearing on the left side.  3.  The patient is morbidly obese.  I suspect he also has significant obstructive sleep apnea.  P: 1.  The hearting test results and MRI findings are reviewed with the patient.  The patient has previously discussed the surgical options with Dr. Christella Noa. He is interested in undergoing surgical resection of his tumor.   2.  The possible surgical approaches, including translabyrinthine resection versus suboccipital approach are also discussed with the patient.  The patient is agreeable to either option.  We will make the final decision after I discuss the options with Dr. Christella Noa.  3.  The possibility of performing a tracheostomy prior to the surgery is also discussed extensively with the patient.  In light of his morbid obesity and likely OSA, I believe he will have a smoother recovery course if he has a tracheostomy tube placed prior to the lengthy procedure.  4.  The patient is scheduled to undergo a cardiac stress test next week for his preop clearance.  5.  60 minutes are spent with the patient, with more than 50% spent discussing the diagnosis and treatment options.

## 2012-10-11 NOTE — Progress Notes (Signed)
Family updated by D. Day, RN at approximately [phone]. Called to update around 1030.

## 2012-10-11 NOTE — H&P (Signed)
BP 152/94  Pulse 66  Temp(Src) 97.2 F (36.2 C) (Oral)  Resp 20  SpO2 95% [name] is a 52 year old gentleman who presents today for about a three month history of problems with his coordination and a two month history of severe worsening.  He has had to use a wheelchair over these two months because his balance is just that poor.  He has had a great deal of difficulty using his hands.  Buttoning shirts for example has become very difficult for him.  He has had urinary frequency.  He says he has not had any problems with incontinence just that he is going a lot more than he used to.  He has had no seizures.  He does feel dizzy all the time.    Physical exam performed by Dr. Verdene Arellano noted that he had hoarseness, had nystagmus of right lateral gaze, that he had some weakness in his right face, that he has no hearing on the right side, that a gag reflex was present and that he also had no problems with dysdiadochokinesis but has significant dysmetria on the right side and that Romberg was positive.    He did undergo an MRI scan and it confirmed that he had a large cerebellar pontine angle mass and is being sent to me for surgical evaluation.    He is a self-employed gentleman.  He is right-handed.  PAST MEDICAL HISTORY:   Hypertension, myocardial infarction, brain tumor.    PAST SURGICAL HISTORY:   He has undergone a coronary artery bypass grafting.  He has had fistula surgery and a herniorrhaphy in the past.    FAMILY HISTORY:    Mother is deceased.  Father is 18 and deceased.  Hypertension and heart disease are present in the family history.   SOCIAL HISTORY:    He does smoke.  He does use alcohol.  He does not have a history of substance abuse.  He reports being 5'11" and 375 pounds.  I think that is not accurate.  I think he is well over 400 pounds and I do not think he is 5'11" tall.  ALLERGIES:     He says he is allergic to Morphine.  He is allergic to steroids (he said they make  his heart race).  He listed no other symptoms.  He said that dye in his veins made him dizzy.     REVIEW OF SYSTEMS:   Positive for hypertension, problems with gait, difficulty with his coordination and some weakness in his hands.  He denies constitutional, eye, respiratory, GI, GU, skin, endocrine, psychiatric, hematologic and allergic problems.    MEDICATIONS:    He is currently taking Atenolol and takes Vicodin.  PHYSICAL EXAMINATION:   On examination, he is alert and oriented x four and answering all questions appropriately.  Memory, language, attention span, and fund of knowledge are normal.  Speech is clear but he does have a very weak phonation.  He has an extraordinarily weak cough.  He has positive nystagmus and right lateral gaze, very mild on the left.  He has right facial weakness with asymmetry of his smile.  Tongue protrudes slightly towards the right side.  He has significant dysmetria in the right upper extremity versus the left.  He has no dysdiadochokinesis. He had normal strength in both upper and lower extremities.  He is morbidly obese.  He is able to move all extremities fairly well.  Muscle tone and bulk are normal.  Coordination is obviously  impaired secondary to the cerebellar problems.  He does have weakness in both grips.  Proprioception is intact. Light touch is intact.   DIAGNOSTIC STUDIES:   MRI of the brain is reviewed.  He has a large uniformly enhancing mass with a cone extending into the porus acusticus on the right side causing significant compression of the brain stem.  The fourth ventricle and Sylvian aqueduct are both patent.  He has no hydrocephalus.  Very little edema in the surrounding brain.  The mass is 3 x 4 x 27.  No other abnormalities are appreciated.  No epidural, subdural hematoma.  DIAGNOSIS and PLAN:   What I believe [name] has is a right acoustic neuroma causing a great deal of brain stem compression.  I think he has multiple cranial nerve palsies as  a result of this-the hoarseness and facial asymmetry.  I believe he needs to have this tumor resected. I also believe he needs to undergo cardiac clearance as he says he has undergone a cardiac stent six years ago and has not seen anyone since by his account.  I also will have him see Dr. Benjamine Arellano for consult. Dr. Benjamine Arellano and I have done a number of these tumors together and I will have him join me on this one.  Risks and benefits, stroke, coma, death, bleeding, infection, need for further surgery, inability to resect the tumor in its entirety, permanent facial paralysis on the right side are discussed.  My statement to him was that if I could leave a very small amount of tumor and preserve the 7th nerve, then that is what I would do.  I can deal with that possibly later on with radiosurgery.  He has made his intention known which is take as much tumor as possible, 7th nerve being secondary priority.  He is already deaf and we are not going to try to preserve hearing.

## 2012-10-12 DIAGNOSIS — Z9889 Other specified postprocedural states: Secondary | ICD-10-CM

## 2012-10-12 DIAGNOSIS — D333 Benign neoplasm of cranial nerves: Secondary | ICD-10-CM | POA: Diagnosis present

## 2012-10-12 DIAGNOSIS — Z9911 Dependence on respirator [ventilator] status: Secondary | ICD-10-CM

## 2012-10-12 LAB — CBC
Platelets: 315 10*3/uL (ref 150–400)
RBC: 5.17 MIL/uL (ref 4.22–5.81)
WBC: 21.2 10*3/uL — ABNORMAL HIGH (ref 4.0–10.5)

## 2012-10-12 LAB — BLOOD GAS, ARTERIAL
Acid-base deficit: 2.2 mmol/L — ABNORMAL HIGH (ref 0.0–2.0)
Acid-base deficit: 0.5 mmol/L (ref 0.0–2.0)
Drawn by: 129801
FIO2: 1 %
FIO2: 0.5 %
MECHVT: 600 mL
O2 Saturation: 92.3 %
Patient temperature: 98.8
TCO2: 23.6 mmol/L (ref 0–100)
TCO2: 27.5 mmol/L (ref 0–100)

## 2012-10-12 LAB — GLUCOSE, CAPILLARY: Glucose-Capillary: 167 mg/dL — ABNORMAL HIGH (ref 70–99)

## 2012-10-12 LAB — BASIC METABOLIC PANEL
CO2: 23 mEq/L (ref 19–32)
Calcium: 8.7 mg/dL (ref 8.4–10.5)
GFR calc non Af Amer: >90 mL/min (ref >90)
Sodium: 140 mEq/L (ref 135–145)

## 2012-10-12 MED ORDER — IPRATROPIUM BROMIDE HFA 17 MCG/ACT IN AERS: 4.0000 | INHALATION_SPRAY | RESPIRATORY_TRACT | Status: DC | PRN

## 2012-10-12 MED ORDER — ALBUTEROL SULFATE HFA 108 (90 BASE) MCG/ACT IN AERS: 4.0000 | INHALATION_SPRAY | RESPIRATORY_TRACT | Status: DC | PRN

## 2012-10-12 MED ORDER — SODIUM CHLORIDE 0.9 % IV SOLN
25.0000 ug/h | INTRAVENOUS | Status: DC
  Administered 2012-10-14 – 2012-10-15 (×2): 100 ug/h via INTRAVENOUS
  Filled 2012-10-12 (×4): qty 50

## 2012-10-12 MED ORDER — DEXAMETHASONE SODIUM PHOSPHATE 4 MG/ML IJ SOLN
4.0000 mg | Freq: Four times a day (QID) | INTRAMUSCULAR | Status: AC
  Administered 2012-10-13 (×3): 4 mg via INTRAVENOUS
  Filled 2012-10-12 (×4): qty 1

## 2012-10-12 MED ORDER — ONDANSETRON HCL 4 MG/2ML IJ SOLN: 4.0000 mg | INTRAMUSCULAR | Status: DC | PRN

## 2012-10-12 MED ORDER — PROMETHAZINE HCL 25 MG PO TABS: 12.5000 mg | ORAL_TABLET | ORAL | Status: DC | PRN

## 2012-10-12 MED ORDER — FENTANYL BOLUS VIA INFUSION
25.0000 ug | INTRAVENOUS | Status: DC | PRN
  Filled 2012-10-12: qty 100

## 2012-10-12 MED ORDER — PROPOFOL 10 MG/ML IV EMUL: 5.0000 ug/kg/min | INTRAVENOUS | Status: DC

## 2012-10-12 MED ORDER — ALBUTEROL SULFATE HFA 108 (90 BASE) MCG/ACT IN AERS
4.0000 | INHALATION_SPRAY | RESPIRATORY_TRACT | Status: DC
  Administered 2012-10-12 – 2012-10-13 (×8): 4 via RESPIRATORY_TRACT
  Filled 2012-10-12: qty 6.7

## 2012-10-12 MED ORDER — SODIUM CHLORIDE 0.9 % IV SOLN
25.0000 ug/h | INTRAVENOUS | Status: DC
  Administered 2012-10-12: 25 ug/h via INTRAVENOUS
  Filled 2012-10-12: qty 50

## 2012-10-12 MED ORDER — ONDANSETRON HCL 4 MG/2ML IJ SOLN: 4.0000 mg | Freq: Once | INTRAMUSCULAR | Status: DC | PRN

## 2012-10-12 MED ORDER — ONDANSETRON HCL 4 MG PO TABS: 4.0000 mg | ORAL_TABLET | ORAL | Status: DC | PRN

## 2012-10-12 MED ORDER — PROPOFOL 10 MG/ML IV EMUL
5.0000 ug/kg/min | INTRAVENOUS | Status: DC
  Administered 2012-10-12: 40 ug/kg/min via INTRAVENOUS
  Administered 2012-10-12: 30 ug/kg/min via INTRAVENOUS
  Administered 2012-10-12: 50 ug/kg/min via INTRAVENOUS
  Filled 2012-10-12 (×3): qty 100

## 2012-10-12 MED ORDER — IPRATROPIUM BROMIDE HFA 17 MCG/ACT IN AERS
4.0000 | INHALATION_SPRAY | RESPIRATORY_TRACT | Status: DC
  Administered 2012-10-12 – 2012-10-13 (×8): 4 via RESPIRATORY_TRACT
  Filled 2012-10-12: qty 12.9

## 2012-10-12 MED ORDER — DEXAMETHASONE SODIUM PHOSPHATE 10 MG/ML IJ SOLN
6.0000 mg | Freq: Four times a day (QID) | INTRAMUSCULAR | Status: AC
  Administered 2012-10-12 (×4): 6 mg via INTRAVENOUS
  Filled 2012-10-12: qty 1
  Filled 2012-10-12 (×4): qty 0.6

## 2012-10-12 MED ORDER — HYDROMORPHONE HCL PF 1 MG/ML IJ SOLN: 0.2500 mg | INTRAMUSCULAR | Status: DC | PRN

## 2012-10-12 MED ORDER — POTASSIUM CHLORIDE IN NACL 20-0.9 MEQ/L-% IV SOLN
INTRAVENOUS | Status: DC
  Administered 2012-10-12 – 2012-10-14 (×5): via INTRAVENOUS
  Filled 2012-10-12 (×8): qty 1000

## 2012-10-12 MED ORDER — CHLORHEXIDINE GLUCONATE 0.12 % MT SOLN
15.0000 mL | Freq: Two times a day (BID) | OROMUCOSAL | Status: DC
  Administered 2012-10-12 – 2012-10-22 (×21): 15 mL via OROMUCOSAL
  Filled 2012-10-12 (×23): qty 15

## 2012-10-12 MED ORDER — HYDROCODONE-ACETAMINOPHEN 5-325 MG PO TABS
1.0000 | ORAL_TABLET | ORAL | Status: DC | PRN
  Administered 2012-10-16 – 2012-10-19 (×5): 1 via ORAL
  Filled 2012-10-12 (×5): qty 1

## 2012-10-12 MED ORDER — PANTOPRAZOLE SODIUM 40 MG IV SOLR
40.0000 mg | Freq: Every day | INTRAVENOUS | Status: DC
  Administered 2012-10-12 – 2012-10-20 (×10): 40 mg via INTRAVENOUS
  Filled 2012-10-12 (×11): qty 40

## 2012-10-12 MED ORDER — NALOXONE HCL 0.4 MG/ML IJ SOLN: 0.0800 mg | INTRAMUSCULAR | Status: DC | PRN

## 2012-10-12 MED ORDER — ATENOLOL 25 MG PO TABS
25.0000 mg | ORAL_TABLET | Freq: Two times a day (BID) | ORAL | Status: DC
  Filled 2012-10-12 (×4): qty 1

## 2012-10-12 MED ORDER — ROCURONIUM BROMIDE 100 MG/10ML IV SOLN
INTRAVENOUS | Status: DC | PRN
  Administered 2012-10-12: 50 mg via INTRAVENOUS

## 2012-10-12 MED ORDER — FENTANYL BOLUS VIA INFUSION
25.0000 ug | Freq: Four times a day (QID) | INTRAVENOUS | Status: DC | PRN
  Filled 2012-10-12: qty 100

## 2012-10-12 MED ORDER — BIOTENE DRY MOUTH MT LIQD
15.0000 mL | Freq: Four times a day (QID) | OROMUCOSAL | Status: DC
  Administered 2012-10-12 – 2012-10-22 (×42): 15 mL via OROMUCOSAL

## 2012-10-12 MED ORDER — BACITRACIN ZINC 500 UNIT/GM EX OINT
TOPICAL_OINTMENT | CUTANEOUS | Status: DC | PRN
  Administered 2012-10-12: 1 via TOPICAL

## 2012-10-12 MED ORDER — DEXAMETHASONE SODIUM PHOSPHATE 4 MG/ML IJ SOLN
4.0000 mg | Freq: Three times a day (TID) | INTRAMUSCULAR | Status: DC
  Administered 2012-10-14 – 2012-10-18 (×15): 4 mg via INTRAVENOUS
  Filled 2012-10-12 (×18): qty 1

## 2012-10-12 MED ORDER — ACETAMINOPHEN 10 MG/ML IV SOLN
1000.0000 mg | Freq: Four times a day (QID) | INTRAVENOUS | Status: AC
  Administered 2012-10-12 (×4): 1000 mg via INTRAVENOUS
  Filled 2012-10-12 (×4): qty 100

## 2012-10-12 MED ORDER — LABETALOL HCL 5 MG/ML IV SOLN
10.0000 mg | INTRAVENOUS | Status: DC | PRN
  Administered 2012-10-12 – 2012-10-13 (×3): 20 mg via INTRAVENOUS
  Administered 2012-10-13: 40 mg via INTRAVENOUS
  Administered 2012-10-13: 10 mg via INTRAVENOUS
  Administered 2012-10-14: 20 mg via INTRAVENOUS
  Filled 2012-10-12: qty 8
  Filled 2012-10-12 (×6): qty 4

## 2012-10-12 MED ORDER — OXYCODONE HCL 5 MG PO TABS
5.0000 mg | ORAL_TABLET | ORAL | Status: DC | PRN
  Administered 2012-10-16 – 2012-10-19 (×2): 5 mg via ORAL
  Filled 2012-10-12 (×2): qty 1

## 2012-10-12 MED ORDER — FUROSEMIDE 10 MG/ML IJ SOLN
40.0000 mg | Freq: Once | INTRAMUSCULAR | Status: AC
  Administered 2012-10-12: 40 mg via INTRAVENOUS
  Filled 2012-10-12: qty 4

## 2012-10-12 MED ORDER — SENNA 8.6 MG PO TABS
1.0000 | ORAL_TABLET | Freq: Two times a day (BID) | ORAL | Status: DC
  Administered 2012-10-15 – 2012-10-22 (×10): 8.6 mg via ORAL
  Filled 2012-10-12 (×22): qty 1

## 2012-10-12 NOTE — Op Note (Signed)
NAME:  [name], [name] NO.:  [id number]  MEDICAL RECORD NO.:  [id number]  LOCATION:  [facility]                         FACILITY:  [facility]  PHYSICIAN:  Leta Baptist, MD            DATE OF BIRTH:  December 12, 1960  DATE OF PROCEDURE:  10/11/2012 DATE OF DISCHARGE:                              OPERATIVE REPORT   SURGEON:  Leta Baptist, MD  PREOPERATIVE DIAGNOSES: 1. Right acoustic neuroma 2. Morbid obesity. 3. Obstructive sleep apnea.  POSTOPERATIVE DIAGNOSIS: 1. Right acoustic neuroma 2. Morbid obesity. 3. Obstructive sleep apnea.  PROCEDURES PERFORMED: 1. Tracheostomy. 2. Right partial acoustic neuroma resection (internal auditory canal).  ANESTHESIA:  General anesthesia.  COMPLICATIONS:  None.  INDICATIONS FOR PROCEDURE:  The patient is a 52 year old morbidly obese male who developed significant difficulty with his balance and coordination approximately 3 months ago.  He has progressively worsened. Over the past month, he has been wheelchair bound.  He also developed multiple neurologic problems, including difficulty using his hands, hoarseness, urinary frequency, and right-sided deafness.  His recent MRI scan showed large right cerebellar pontine angle mass, with extension into the right internal auditory canal.  The MRI results were consistent with a large right acoustic neuroma.  The neuroma has caused a significant brainstem compression.  As a result, the patient was seen by Dr. Christella Noa and surgical intervention was recommended.  It should be noted that the patient is morbidly obese.  He has been having difficulty sleeping supine for the past year.  Based on the above findings, the decision was made to perform tracheostomy prior to the acoustic neuroma resection.  The decision was to perform the acoustic neuroma and resection by Dr. Christella Noa using the rectosigmoid approach.  The internal auditory portion of the acoustic neuroma can then be approached from the medial  aspect by drilling the temporal bone around the internal auditory canal.  This would help decompress his facial nerves.  The patient was noted to have mild facial nerve weakness on the right side prior to his surgery.  DESCRIPTION OF PROCEDURE:  The patient was taken to the operating room and placed supine on the operating table.  General endotracheal tube anesthesia was administered by the anesthesiologist.  The patient was positioned and prepped and draped in a standard fashion for tracheostomy tube placement.  A 1% lidocaine with 1:100,000 epinephrine was injected at the anterior neck.  Vertical incision was made over his cricoid cartilage.  The incision was carried down to the level of the strap muscles.  The strap muscles were divided at midline and retracted laterally.  The thyroid isthmus was identified and divided at midline. The anterior tracheal wall was subsequently identified.  The tracheal opening was performed at the second tracheal ring.  An #8 cuffed Shiley tracheostomy tube was then inserted without difficulty.  It was secured in place with 2-0 silk sutures and circumferential neck tie.  The patient was then positioned and prepped and draped by Dr. Christella Noa for right retrosigmoid procedure.  Please see his operative note for details of the procedure.  Upon resection of the intracranial portion of the acoustic neuroma, the dura overlying the posterior cranial fossa  was then opened using the combination of bipolar electrocautery and a round knife.  The dura was then resected, opened, exposing the medial aspect of the posterior cranial fossa.  Using #3 and #2 diamond drills, the temporal bone surrounding the internal auditory canal was carefully removed.  The superior, posterior, and inferior aspect of the internal auditory canal was then opened.  Stimulation of the facial nerve shows the nerve to be functional and located at the anterior superior aspect of the internal  auditory canal.  The inferior one-half of the acoustic neuroma was then removed.  The cuff of tissue surrounding the facial nerve was preserved to prevent injury to the facial nerve.  The patient was turned over to Dr. Christella Noa.  Please see his operative note for details of the rest of the procedure.  OPERATIVE FINDINGS: 1. A #8 cuffed Shiley tracheostomy tube was placed. 2. The temporal bone surrounding the internal auditory canal was     removed to decompress the facial nerve.  The inferoposterior one-     half of the acoustic neuroma was resected.  PLAN:  The patient will be returned to the intensive care unit for his postop care after the surgery.     Leta Baptist, MD     ST/MEDQ  D:  10/11/2012  T:  10/12/2012  Job:  [id number]

## 2012-10-12 NOTE — Consult Note (Signed)
Name: [name] MRN: [id number] DOB: 1961/05/10    LOS: 1  PULMONARY / CRITICAL CARE MEDICINE   Consult requested by: Dr. Christella Noa Reason: Mechanical ventilation management. HPI:   52 years old male with PMH relevant for morbid obesity, CAD post CABG, CHF, HTN, DM. Presents electively for acustic neuroma resection. He has had severe problems with his coordination and balance. Consult called for mechanical ventilation in the immediate post op. He underwent tracheostomy placement prior to surgical resection of the acoustic neuroma. At the time of my exam the patient is sedated, on mechanical ventilation via tracheostomy, synchronous with the vent. Hemodynamically stable.   Past Medical History  Diagnosis Date  . CAD (coronary artery disease)     Cath 09, 99% LAD  . Cardiomyopathy, ischemic     Ischemic  . Obesity   . Fibromyalgia   . Shortness of breath   . CHF (congestive heart failure)   . Hypertension     dr Percival Spanish  . Cyst near coccyx   . Diabetes mellitus without complication     borderline  . Cyst near tailbone    Past Surgical History  Procedure Laterality Date  . Coronary artery bypass graft      LIMA to LAD 2009  . Umbilical hernia repair    . Anal fistulectomy     Prior to Admission medications   Medication Sig Start Date End Date Taking? Authorizing Provider  atenolol (TENORMIN) 25 MG tablet Take 25 mg by mouth 2 (two) times daily.   Yes Historical Provider, MD  HYDROcodone-acetaminophen (VICODIN) 5-500 MG per tablet Take 1 tablet by mouth every 6 (six) hours as needed. For pain   Yes Historical Provider, MD   Allergies Allergies  Allergen Reactions  . Morphine And Related Anaphylaxis    "makes me stop breathing"  . Other     Steroids makes hearts race    Family History Family History  Problem Relation Age of Onset  . CAD Father    Social History  reports that he has been smoking Cigarettes.  He has a 10 pack-year smoking history. He uses  smokeless tobacco. He reports that he does not drink alcohol or use illicit drugs.  Review Of Systems:  Unable to provide.  Current Status:  Vital Signs: Temp:  [97.8 F (36.6 C)-98.5 F (36.9 C)] 98.5 F (36.9 C) (02/22 0359) Pulse Rate:  [93-100] 100 (02/22 0600) Resp:  [17-21] 17 (02/22 0600) BP: (144-171)/(71-108) 145/71 mmHg (02/22 0600) SpO2:  [96 %-99 %] 97 % (02/22 0600) Arterial Line BP: (118-208)/(64-92) 118/64 mmHg (02/22 0600) FiO2 (%):  [40 %] 40 % (02/22 0321) Weight:  [361 lb (163.749 kg)] 361 lb (163.749 kg) (02/22 0100)  Physical Examination: General:  Intubated, mechanically ventilated, no acute distress Neuro:  Sedated, synchronous, nonfocal HEENT:  PERRL, pink conjunctivae, moist membranes Neck:  Supple, no JVD   Cardiovascular:  RRR, no M/R/G Lungs:  Bilateral diminished air entry, no W/R/R Abdomen:  Soft, nontender, nondistended, bowel sounds present Musculoskeletal:  Moves all extremities, no pedal edema Skin:  No rash  I have reviewed the labs and personally reviewed the radiology imaging since admission.  Results for orders placed during the hospital encounter of 10/11/12 (from the past 24 hour(s))  BASIC METABOLIC PANEL     Status: Abnormal   Collection Time    10/12/12  1:00 AM      Result Value Range   Sodium 140  135 - 145 mEq/L   Potassium 4.6  3.5 - 5.1 mEq/L   Chloride 103  96 - 112 mEq/L   CO2 23  19 - 32 mEq/L   Glucose, Bld 193 (*) 70 - 99 mg/dL   BUN 15  6 - 23 mg/dL   Creatinine, Ser 0.96  0.50 - 1.35 mg/dL   Calcium 8.7  8.4 - 10.5 mg/dL   GFR calc non Af Amer >90  >90 mL/min   GFR calc Af Amer >90  >90 mL/min  CBC     Status: Abnormal   Collection Time    10/12/12  1:00 AM      Result Value Range   WBC 21.2 (*) 4.0 - 10.5 K/uL   RBC 5.17  4.22 - 5.81 MIL/uL   Hemoglobin 15.7  13.0 - 17.0 g/dL   HCT 46.4  39.0 - 52.0 %   MCV 89.7  78.0 - 100.0 fL   MCH 30.4  26.0 - 34.0 pg   MCHC 33.8  30.0 - 36.0 g/dL   RDW 12.9  11.5 -  15.5 %   Platelets 315  150 - 400 K/uL  BLOOD GAS, ARTERIAL     Status: Abnormal   Collection Time    10/12/12  2:38 AM      Result Value Range   FIO2 1.00     Delivery systems VENTILATOR     Mode PRESSURE REGULATED VOLUME CONTROL     VT 600     Rate 16     Peep/cpap 5.0     pH, Arterial 7.358  7.350 - 7.450   pCO2 arterial 40.9  35.0 - 45.0 mmHg   pO2, Arterial 319.0 (*) 80.0 - 100.0 mmHg   Bicarbonate 22.4  20.0 - 24.0 mEq/L   TCO2 23.6  0 - 100 mmol/L   Acid-base deficit 2.2 (*) 0.0 - 2.0 mmol/L   O2 Saturation 99.4     Patient temperature 98.8     Collection site A-LINE     Drawn by [phone]     Sample type ARTERIAL DRAW    GLUCOSE, CAPILLARY     Status: Abnormal   Collection Time    10/12/12  3:24 AM      Result Value Range   Glucose-Capillary 167 (*) 70 - 99 mg/dL   Comment 1 Documented in Chart     Comment 2 Notify RN      Ventilator Settings: Vent Mode:  [-] PRVC FiO2 (%):  [40 %] 40 % Set Rate:  [16 bmp] 16 bmp Vt Set:  [600 mL] 600 mL PEEP:  [5 cmH20] 5 cmH20 Plateau Pressure:  [17 cmH20] 17 cmH20  ETT:  Tracheostomy (10/11/12) ECG:  NSR Lines: Peripheral lines  ASSESSMENT AND PLAN 52 years old male with PMH relevant for morbid obesity, CAD post CABG, CHF, HTN, DM. Presents electively for acustic neuroma resection. Consult called to assist with management of mechanical ventilation. The patient is hemodynamically stable. Synchronous with the vent.  PULMONARY A:   1) Post op of acoustic neuroma resection 2) Tracheostomy P:   -  Mechanical ventilation   - PRVC, Vt: 8cc/kg, PEEP: 5, RR: 16, FiO2: 100% and adjust to keep O2 sat > 94% - VAP prevention order set.  - Rest of management per primary team.   Critical Care Time devoted to patient care services described in this note is: 43 Minutes  Waynetta Pean, M.D. Pulmonary [facility] [place] Pager: [phone]  10/12/2012, 6:28 AM

## 2012-10-12 NOTE — Progress Notes (Signed)
INITIAL NUTRITION ASSESSMENT  DOCUMENTATION CODES Per approved criteria  -Morbid Obesity   INTERVENTION:  If EN warranted, recommend Promote formula at 25 ml/hr with Prostat liquid protein 60 ml 3 times daily to provide 1400 total kcals, 157 gm protein, 906 ml of free water ---> rest of kcals guidelines for permissive underfeeding to be met with current Propofol infusion RD to follow for nutrition care plan  NUTRITION DIAGNOSIS: Inadequate oral intake related to inability to eat as evidenced by NPO status  Goal: Initiate EN support (provide 60-70% of estimated calorie needs (22-25 kcals/kg ideal body weight) and 100% of estimated protein needs, based on ASPEN guidelines for permissive underfeeding in critically ill obese individuals) as warranted within 24-48 hours of ICU admission  Monitor:  EN initiation, Propofol infusion, respiratory status, weight, labs, I/O's  Reason for Assessment: VDRF  52 y.o. 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  Patient s/p procedures 2/22: RETROSIGMOID CRANIECTOMY FOR TUMOR RESECTION  TRACHEOSTOMY  ACOUSTIC NEUROMA RESECTION  Patient is currently on ventilator support MV: 10.3 Temp: 36.9 Propofol: 19.6 ml/hr ---> 517 fat kcals  Height: Ht Readings from Last 1 Encounters:  10/12/12 5\' 11"  (1.803 m)    Weight: Wt Readings from Last 1 Encounters:  10/12/12 361 lb (163.749 kg)    Ideal Body Weight: 172 lb  % Ideal Body Weight: 209%  Wt Readings from Last 10 Encounters:  10/12/12 361 lb (163.749 kg)  10/12/12 361 lb (163.749 kg)  10/04/12 361 lb 12.8 oz (164.111 kg)  09/09/12 425 lb (192.779 kg)    Usual Body Weight: 425 lb  % Usual Body Weight: 85%  BMI:  Body mass index is 50.37  kg/(m^2).  Estimated Nutritional Needs: Kcal: 2680 Protein: 155-165 gm Fluid: 2.6-2.7 L  Skin: head & neck incisions  Diet Order: NPO  EDUCATION NEEDS: -No education needs identified at this time   Intake/Output Summary (Last 24 hours) at 10/12/12 1029 Last data filed at 10/12/12 1000  Gross per 24 hour  Intake 5317.01 ml  Output   1460 ml  Net 3857.01 ml    Labs:   Recent Labs Lab 10/12/12 0100  NA 140  K 4.6  CL 103  CO2 23  BUN 15  CREATININE 0.96  CALCIUM 8.7  GLUCOSE 193*    CBG (last 3)   Recent Labs  10/11/12 0615 10/12/12 0324  GLUCAP 107* 167*    Scheduled Meds: . acetaminophen  1,000 mg Intravenous Q6H  . albuterol  4 puff Inhalation Q4H  . antiseptic oral rinse  15 mL Mouth Rinse QID  . atenolol  25 mg Oral BID  . chlorhexidine  15 mL Mouth Rinse BID  . dexamethasone  6 mg Intravenous Q6H   Followed by  . [START ON 10/13/2012] dexamethasone  4 mg Intravenous Q6H   Followed by  . [START ON 10/14/2012] dexamethasone  4 mg Intravenous Q8H  . furosemide  40 mg Intravenous Once  . ipratropium  4 puff Inhalation Q4H  . pantoprazole (PROTONIX) IV  40 mg Intravenous QHS  . senna  1 tablet Oral BID    Continuous Infusions: . 0.9 % NaCl with KCl 20 mEq / L 80 mL/hr at 10/12/12 0137  . fentaNYL infusion INTRAVENOUS 75 mcg/hr (10/12/12 1028)  . propofol 20  mcg/kg/min (10/12/12 1028)    Past Medical History  Diagnosis Date  . CAD (coronary artery disease)     Cath 09, 99% LAD  . Cardiomyopathy, ischemic     Ischemic  . Obesity   . Fibromyalgia   . Shortness of breath   . CHF (congestive heart failure)   . Hypertension     dr Percival Spanish  . Cyst near coccyx   . Diabetes mellitus without complication     borderline  . Cyst near tailbone     Past Surgical History  Procedure Laterality Date  . Coronary artery bypass graft      LIMA to LAD 2009  . Umbilical hernia repair    . Anal fistulectomy      Arthur Holms, RD, LDN Pager #:  [phone] After-Hours Pager #: [phone]

## 2012-10-12 NOTE — Progress Notes (Signed)
Patient ID: [name], male   DOB: 19-Aug-1961, 52 y.o.   MRN: [id number] Subjective:  The patient is alert and follows commands when he is not sedated pharmacologically. He is in no apparent distress.  Objective: Vital signs in last 24 hours: Temp:  [97.8 F (36.6 C)-98.5 F (36.9 C)] 98.3 F (36.8 C) (02/22 0800) Pulse Rate:  [93-101] 99 (02/22 0800) Resp:  [17-21] 18 (02/22 0800) BP: (144-171)/(71-108) 166/79 mmHg (02/22 0800) SpO2:  [95 %-99 %] 95 % (02/22 0802) Arterial Line BP: (118-208)/(64-92) 173/82 mmHg (02/22 0800) FiO2 (%):  [30 %-40 %] 30 % (02/22 0802) Weight:  [163.749 kg (361 lb)] 163.749 kg (361 lb) (02/22 0100)  Intake/Output from previous day: 02/21 0701 - 02/22 0700 In: 7237 [I.V.:7137; IV Piggyback:100] Out: [id number] [Urine:1820] Intake/Output this shift: Total I/O In: 80 [I.V.:80] Out: 60 [Urine:60]  Physical exam Glasgow Coma Scale 11 intubated/trach. His pupils are myotic and equal. He follows commands in all 4 extremities. I could not get him to smile on either side this morning.  Lab Results:  Recent Labs  10/12/12 0100  WBC 21.2*  HGB 15.7  HCT 46.4  PLT 315   BMET  Recent Labs  10/12/12 0100  NA 140  K 4.6  CL 103  CO2 23  GLUCOSE 193*  BUN 15  CREATININE 0.96  CALCIUM 8.7    Studies/Results: No results found.  Assessment/Plan: Postop day #1: The patient is doing well neurologically. He will likely go on trach collar today.  LOS: 1 day     Atzel Mccambridge D 10/12/2012, 8:38 AM

## 2012-10-12 NOTE — Anesthesia Postprocedure Evaluation (Signed)
  Anesthesia Post-op Note  Patient: [name]  Procedure(s) Performed: Procedure(s) with comments: RETROSIGMOID CRANIECTOMY FOR TUMOR RESECTION (Right) - Craniotomy for acoustic neuroma TRACHEOSTOMY (N/A) ACOUSTIC NEUROMA RESECTION (N/A)  Patient Location: PACU  Anesthesia Type:General  Level of Consciousness: sedated and Patient remains intubated per anesthesia plan  Airway and Oxygen Therapy: Patient placed on Ventilator (see vital sign flow sheet for setting)  Post-op Pain: none  Post-op Assessment: Post-op Vital signs reviewed  Post-op Vital Signs: Reviewed  Complications: No apparent anesthesia complications

## 2012-10-12 NOTE — Transfer of Care (Signed)
Immediate Anesthesia Transfer of Care Note  Patient: [name]  Procedure(s) Performed: Procedure(s) with comments: RETROSIGMOID CRANIECTOMY FOR TUMOR RESECTION (Right) - Craniotomy for acoustic neuroma TRACHEOSTOMY (N/A) ACOUSTIC NEUROMA RESECTION (N/A)  Patient Location: NICU  Anesthesia Type:General  Level of Consciousness: Patient remains intubated per anesthesia plan  Airway & Oxygen Therapy: Patient placed on Ventilator (see vital sign flow sheet for setting)  Post-op Assessment: Report given to PACU RN and Post -op Vital signs reviewed and stable  Post vital signs: Reviewed  Complications: No apparent anesthesia complications

## 2012-10-12 NOTE — Op Note (Signed)
10/11/2012 - 10/12/2012  12:40 AM  PATIENT:  [name]  52 y.o. male who presents with gait difficulties, right facial weakness, deafness right ear, and a  large CPA mass  PRE-OPERATIVE DIAGNOSIS:  acoustic neuroma  POST-OPERATIVE DIAGNOSIS:  acoustic neuroma  PROCEDURE:  Procedure(s): RETROSIGMOID CRANIECTOMY FOR TUMOR RESECTION TRACHEOSTOMY ACOUSTIC NEUROMA RESECTION  SURGEON:  Surgeon(s): [name], MD Ascencion Dike, MD Floyce Stakes, MD Ophelia Charter, MD  ASSISTANTS:Jenkins, Merry Proud   ANESTHESIA:   general  EBL:  Total I/O In: [id number] [I.V.:1850] Out: 215 [Urine:215]  BLOOD ADMINISTERED:none  CELL SAVER GIVEN:none  COUNT:per nursing  DRAINS: none   SPECIMEN:  Source of Specimen:  Cerebellar pontine angle  DICTATION: [name] was brought to the operating room where he underwent a tracheostomy performed and dictated under separate cover by Dr. Benjamine Mola. After the tracheostomy was performed we positioned him in left lateral decubitus with his left upper extremity supported in a sling. All pressure points were properly padded. His head was shaved and prepped in a sterile manner. Exposing the retromastoid region of the scalp. He was draped in a sterile fashion. I infiltrated 10 cc lidocaine posterior to the right ear. I made a curvilinear incision behind the ear and using sharp dissection and monopolar cautery dissected through the soft tissue to expose the retromastoid region of the skull. I used a drill to expose the transverse and sigmoid sinuses along with their junction. I then used both the drill and Kerrison punches to create and opening in the occiput to expose the dura. I opened the dura in a stellate fashion basing the flaps along the sinuses. I then used a hand held malleable retractor to expose the cisterna magna. I opened the cisterna magna with the suction and let the csf flow to relax the cerebellum. I placed an Apfelbaum retractor and retractor arm, and  subsequently retracted the right cerebellar hemisphere bring the tumor into view. The microscope was brought into the operative field and the tumor resection was performed with microdissection.  I was able to partially resect the tumor in the following manner. I first coagulated the capsule then took a 15 blade to remove a piece of the mass. Preliminary pathology was consistent with and acoustic neuroma. I used a cusa to gut the tumor then would alternate with coagulating the capsule to shrink the remaining tumor. I was able to stimulate the 7th nerve about 2 hrs into the surgery. I stimulated on the superior medial aspect of the capsule. I continued to remove tumor in a meticulous manner using the bipolar, cusa, microscissors, and suction over the next 10 hours. Dr. Benjamine Mola, drilled the porus acousticus, and removed some of the tumor situated in the porus. He was not able to achieve very good stimulation during the tumor removal so he intentionally left behind tumor to protect the 7th nerve. After that I was still able to stimulate on the capsule of the tumor where I had previously found the nerve. I was not able to trace it to the brain stem at any time during the case. I did leave tumor in the immediate area of the capsule where the nerve could be stimulated. I was able to decompress the cerebellum and the brain stem leaving some tumor just superior to the porus. I upon closing was able to achieve good hemostasis.  I approximated the dura, using a bovine graft to supplement the closure. I then coated the dura with duraseal. I approximated soft  tissue anatomically, and the skin with vertical mattress sutures. Dr. Benjamine Mola, and Dr. Arnoldo Morale assisted with the tumor removal, as did Dr. Joya Salm.  I applied a sterile dressing.  [name] was taken straight to the ICU on a ventilator.   PLAN OF CARE: Admit to inpatient   PATIENT DISPOSITION:  trached and on a vent   Delay start of Pharmacological VTE agent (>24hrs)  due to surgical blood loss or risk of bleeding:  yes

## 2012-10-12 NOTE — Progress Notes (Signed)
Subjective: Pt is still sedated.  Was on trach collar earlier this AM. Now back on vent due to high CO2.  Objective: Vital signs in last 24 hours: Temp:  [97.8 F (36.6 C)-98.5 F (36.9 C)] 98.3 F (36.8 C) (02/22 0800) Pulse Rate:  [93-107] 107 (02/22 1000) Resp:  [16-22] 22 (02/22 1000) BP: (144-171)/(63-108) 164/63 mmHg (02/22 1000) SpO2:  [89 %-99 %] 89 % (02/22 1000) Arterial Line BP: (80-208)/(64-92) 154/71 mmHg (02/22 1000) FiO2 (%):  [30 %-50 %] 50 % (02/22 1000) Weight:  [163.749 kg (361 lb)] 163.749 kg (361 lb) (02/22 0100)  Sedated and not responsive. Pupils are equal. Head dressing in place.  Trach in place and venting well.  Recent Labs  10/12/12 0100  WBC 21.2*  HGB 15.7  HCT 46.4  PLT 315    Recent Labs  10/12/12 0100  NA 140  K 4.6  CL 103  CO2 23  GLUCOSE 193*  BUN 15  CREATININE 0.96  CALCIUM 8.7    Medications:  I have reviewed the patient's current medications. Scheduled: . acetaminophen  1,000 mg Intravenous Q6H  . albuterol  4 puff Inhalation Q4H  . antiseptic oral rinse  15 mL Mouth Rinse QID  . atenolol  25 mg Oral BID  . chlorhexidine  15 mL Mouth Rinse BID  . dexamethasone  6 mg Intravenous Q6H   Followed by  . [START ON 10/13/2012] dexamethasone  4 mg Intravenous Q6H   Followed by  . [START ON 10/14/2012] dexamethasone  4 mg Intravenous Q8H  . ipratropium  4 puff Inhalation Q4H  . pantoprazole (PROTONIX) IV  40 mg Intravenous QHS  . senna  1 tablet Oral BID   [id number], fentaNYL, HYDROcodone-acetaminophen, ipratropium, labetalol, naLOXone (NARCAN)  injection, ondansetron (ZOFRAN) IV, ondansetron, oxyCODONE, promethazine  Assessment/Plan: POD #1 s/p large right AN resection and trach placement.  Stable.  Unable to assess facial nerve function at this time due to sedation. Wean vent support as tolerated.    LOS: 1 day   Kalonji Zurawski,SUI W 10/12/2012, 11:51 AM

## 2012-10-13 ENCOUNTER — Inpatient Hospital Stay (HOSPITAL_COMMUNITY): Payer: BC Managed Care – PPO

## 2012-10-13 DIAGNOSIS — J9600 Acute respiratory failure, unspecified whether with hypoxia or hypercapnia: Secondary | ICD-10-CM

## 2012-10-13 LAB — BASIC METABOLIC PANEL
BUN: 33 mg/dL — ABNORMAL HIGH (ref 6–23)
Creatinine, Ser: 1.56 mg/dL — ABNORMAL HIGH (ref 0.50–1.35)
GFR calc Af Amer: 58 mL/min — ABNORMAL LOW (ref >90)
GFR calc non Af Amer: 50 mL/min — ABNORMAL LOW (ref >90)
Glucose, Bld: 186 mg/dL — ABNORMAL HIGH (ref 70–99)
Potassium: 4.6 mEq/L (ref 3.5–5.1)

## 2012-10-13 LAB — CBC
MCV: 90.8 fL (ref 78.0–100.0)
Platelets: 322 10*3/uL (ref 150–400)
RBC: 4.98 MIL/uL (ref 4.22–5.81)

## 2012-10-13 MED ORDER — ALBUTEROL SULFATE (5 MG/ML) 0.5% IN NEBU
2.5000 mg | INHALATION_SOLUTION | Freq: Four times a day (QID) | RESPIRATORY_TRACT | Status: DC
  Administered 2012-10-13 – 2012-10-22 (×36): 2.5 mg via RESPIRATORY_TRACT
  Filled 2012-10-13 (×35): qty 0.5

## 2012-10-13 MED ORDER — ALBUTEROL SULFATE (5 MG/ML) 0.5% IN NEBU
2.5000 mg | INHALATION_SOLUTION | RESPIRATORY_TRACT | Status: DC | PRN
  Administered 2012-10-19: 2.5 mg via RESPIRATORY_TRACT
  Filled 2012-10-13 (×3): qty 0.5

## 2012-10-13 MED ORDER — IPRATROPIUM BROMIDE 0.02 % IN SOLN
0.5000 mg | Freq: Four times a day (QID) | RESPIRATORY_TRACT | Status: DC
  Administered 2012-10-13 – 2012-10-21 (×31): 0.5 mg via RESPIRATORY_TRACT
  Filled 2012-10-13 (×31): qty 2.5

## 2012-10-13 MED ORDER — ATENOLOL 50 MG PO TABS
50.0000 mg | ORAL_TABLET | Freq: Two times a day (BID) | ORAL | Status: DC
  Administered 2012-10-15 – 2012-10-22 (×14): 50 mg via ORAL
  Filled 2012-10-13 (×20): qty 1

## 2012-10-13 MED ORDER — ARTIFICIAL TEARS OP OINT
TOPICAL_OINTMENT | Freq: Three times a day (TID) | OPHTHALMIC | Status: DC
  Administered 2012-10-13 – 2012-10-19 (×17): via OPHTHALMIC
  Administered 2012-10-19: 1 via OPHTHALMIC
  Administered 2012-10-19 – 2012-10-22 (×7): via OPHTHALMIC
  Filled 2012-10-13 (×2): qty 3.5

## 2012-10-13 NOTE — Progress Notes (Signed)
Patient ID: [name], male   DOB: 12-Jun-1961, 52 y.o.   MRN: [id number] Subjective:  The patient is alert and pleasant. He looks well. He nods appropriately.  Objective: Vital signs in last 24 hours: Temp:  [98.4 F (36.9 C)-100.2 F (37.9 C)] 98.4 F (36.9 C) (02/23 0400) Pulse Rate:  [85-110] 91 (02/23 0800) Resp:  [16-22] 17 (02/23 0800) BP: (114-175)/(49-84) 157/80 mmHg (02/23 0800) SpO2:  [89 %-97 %] 93 % (02/23 0800) Arterial Line BP: (80-154)/(71-72) 154/71 mmHg (02/22 1000) FiO2 (%):  [40 %-50 %] 40 % (02/23 0744) Weight:  [175 kg (385 lb 12.9 oz)] 175 kg (385 lb 12.9 oz) (02/23 0100)  Intake/Output from previous day: 02/22 0701 - 02/23 0700 In: 2445.3 [I.V.:2145.3; IV Piggyback:300] Out: 2070 [Urine:2070] Intake/Output this shift: Total I/O In: 90 [I.V.:90] Out: 100 [Urine:100]  Physical exam the patient is Glasgow Coma Scale 11. He is moving all 4 extremities well. He has a right facial palsy.  Lab Results:  Recent Labs  10/12/12 0100  WBC 21.2*  HGB 15.7  HCT 46.4  PLT 315   BMET  Recent Labs  10/12/12 0100  NA 140  K 4.6  CL 103  CO2 23  GLUCOSE 193*  BUN 15  CREATININE 0.96  CALCIUM 8.7    Studies/Results: Ct Head Wo Contrast  10/13/2012  *RADIOLOGY REPORT*  Clinical Data: Follow-up scan for acoustic neuroma on the right.  CT HEAD WITHOUT CONTRAST  Technique:  Contiguous axial images were obtained from the base of the skull through the vertex without contrast.  Comparison: MRI brain 08/12/2012  Findings: Interval postoperative changes with right temporal occipital craniectomy and resection of the large cerebellopontine angle tumor previously demonstrated on the MRI. There is subcutaneous emphysema and subdural gas consistent with recent surgery.  There is some residual mass effect on the right cerebellum and midbrain although improved since preoperative study. The fourth ventricle is less effaced.  There is low attenuation change in the  right cerebellar peduncle which may represent postoperative or post compressive change.  No evidence of acute intracranial hemorrhage.  Ventricles are not significantly dilated. Gray-white matter junctions are distinct.  IMPRESSION: Postoperative changes consistent with interval removal of a right cerebellopontine angle mass.   Original Report Authenticated By: Lucienne Capers, M.D.     Assessment/Plan: As of today #2: The patient is doing well.  LOS: 2 days     Kenston Longton D 10/13/2012, 8:24 AM

## 2012-10-13 NOTE — Progress Notes (Signed)
Due to SOB while being bathed, patient placed back on full support at 100%.  SPO2 decreased into the 70's after bath, SPO2 to 96%.

## 2012-10-13 NOTE — Progress Notes (Signed)
Staff in to complete pt's bath.  Pt's head of bed lowered to 20 degrees to facilitate movement.  Pt tolerated this position.  When pt was lowered to 10 degrees to facilitate pulling him up in bed, pt began to have increased respirations.  Pt became ashen in color with oxygen saturations in the 70's with good pleth.  HOB elevated to 90 degrees, pt placed on 98% TC and suctioned. Pt able to expectorate more secretions that could be suctioned.  RT notified to have pt placed back on full support.  Pt's oxygen saturations increased to 93% on 100% vent support.Dal Blew, [facility]

## 2012-10-13 NOTE — Progress Notes (Signed)
Subjective: Pt awake and alert. On trach collar, off vent.  Objective: Vital signs in last 24 hours: Temp:  [98.4 F (36.9 C)-100.2 F (37.9 C)] 98.4 F (36.9 C) (02/23 0400) Pulse Rate:  [85-125] 114 (02/23 1130) Resp:  [16-30] 23 (02/23 1130) BP: (114-175)/(49-98) 157/92 mmHg (02/23 1130) SpO2:  [91 %-98 %] 94 % (02/23 1130) FiO2 (%):  [40 %] 40 % (02/23 0744) Weight:  [175 kg (385 lb 12.9 oz)] 175 kg (385 lb 12.9 oz) (02/23 0100)  Awake and responsive. Able to communicate via gestures. Right facial palsy noted. Trach in place. Good air movement.  Recent Labs  10/12/12 0100 10/13/12 0815  WBC 21.2* 28.9*  HGB 15.7 15.2  HCT 46.4 45.2  PLT 315 322    Recent Labs  10/12/12 0100 10/13/12 0815  NA 140 142  K 4.6 4.6  CL 103 106  CO2 23 26  GLUCOSE 193* 186*  BUN 15 33*  CREATININE 0.96 1.56*  CALCIUM 8.7 9.4    Medications:  I have reviewed the patient's current medications. Scheduled: . albuterol  2.5 mg Nebulization Q6H  . antiseptic oral rinse  15 mL Mouth Rinse QID  . atenolol  50 mg Oral BID  . chlorhexidine  15 mL Mouth Rinse BID  . dexamethasone  4 mg Intravenous Q6H   Followed by  . [START ON 10/14/2012] dexamethasone  4 mg Intravenous Q8H  . ipratropium  0.5 mg Nebulization Q6H  . pantoprazole (PROTONIX) IV  40 mg Intravenous QHS  . senna  1 tablet Oral BID   [id number], fentaNYL, HYDROcodone-acetaminophen, labetalol, naLOXone (NARCAN)  injection, ondansetron (ZOFRAN) IV, ondansetron, oxyCODONE, promethazine  Assessment/Plan: POD #2 s/p right AN resection.  Right facial palsy noted. Will need lacrilube to prevent corneal drying.  If continues to tolerate TC, will change to uncuffed trach.   LOS: 2 days   Betrice Wanat,SUI W 10/13/2012, 12:35 PM

## 2012-10-13 NOTE — Progress Notes (Addendum)
Name: [name] MRN: [id number] DOB: 1960/12/20    LOS: 2  PULMONARY / CRITICAL CARE MEDICINE   Consult requested by: Dr. Christella Noa Reason: Mechanical ventilation management. Brief Hx:   52 yo MO WM s/p trach and R acoustic neuroma resection. PCCM consulted for postop vent care.  Current Status: C/o pain, alert, off vent on ATC this am  Vital Signs: Temp:  [98.4 F (36.9 C)-100.2 F (37.9 C)] 98.4 F (36.9 C) (02/23 0400) Pulse Rate:  [85-110] 91 (02/23 0800) Resp:  [16-22] 17 (02/23 0800) BP: (114-175)/(49-84) 157/80 mmHg (02/23 0800) SpO2:  [89 %-97 %] 93 % (02/23 0800) Arterial Line BP: (80-154)/(71-72) 154/71 mmHg (02/22 1000) FiO2 (%):  [40 %-50 %] 40 % (02/23 0744) Weight:  [175 kg (385 lb 12.9 oz)] 175 kg (385 lb 12.9 oz) (02/23 0100)  Physical Examination: General: on trach collar Neuro:alert c/o pain  HEENT:  PERRL, pink conjunctivae, moist membranes Neck:  Supple, no JVD   Cardiovascular:  RRR, no M/R/G Lungs:  Bilateral diminished air entry, no W/R/R Abdomen:  Soft, nontender, nondistended, bowel sounds present Musculoskeletal:  Moves all extremities, no pedal edema Skin:  No rash  I have reviewed the labs and personally reviewed the radiology imaging since admission.  Results for orders placed during the hospital encounter of 10/11/12 (from the past 24 hour(s))  BLOOD GAS, ARTERIAL     Status: Abnormal   Collection Time    10/12/12 11:18 AM      Result Value Range   FIO2 0.50     Delivery systems TRACH COLLAR/TRACH TUBE     pH, Arterial 7.262 (*) 7.350 - 7.450   pCO2 arterial 59.0 (*) 35.0 - 45.0 mmHg   pO2, Arterial 73.3 (*) 80.0 - 100.0 mmHg   Bicarbonate 25.7 (*) 20.0 - 24.0 mEq/L   TCO2 27.5  0 - 100 mmol/L   Acid-base deficit 0.5  0.0 - 2.0 mmol/L   O2 Saturation 92.3     Patient temperature 98.6     Collection site A-LINE     Drawn by [id number]     Sample type ARTERIAL    CBC     Status: Abnormal   Collection Time    10/13/12  8:15 AM       Result Value Range   WBC 28.9 (*) 4.0 - 10.5 K/uL   RBC 4.98  4.22 - 5.81 MIL/uL   Hemoglobin 15.2  13.0 - 17.0 g/dL   HCT 45.2  39.0 - 52.0 %   MCV 90.8  78.0 - 100.0 fL   MCH 30.5  26.0 - 34.0 pg   MCHC 33.6  30.0 - 36.0 g/dL   RDW 13.5  11.5 - 15.5 %   Platelets 322  150 - 400 K/uL    Ventilator Settings: Vent Mode:  [-] PSV;CPAP FiO2 (%):  [40 %-50 %] 40 % Set Rate:  [16 bmp] 16 bmp Vt Set:  [600 mL] 600 mL PEEP:  [5 cmH20] 5 cmH20 Pressure Support:  [5 cmH20] 5 cmH20 Plateau Pressure:  [23 cmH20-26 cmH20] 23 cmH20  ETT:  Tracheostomy (10/11/12) ECG:  NSR Lines: Peripheral lines  ASSESSMENT AND PLAN 52 years old male with PMH relevant for morbid obesity, CAD post CABG, CHF, HTN, DM. Presents electively for acustic neuroma resection. Consult called to assist with management of mechanical ventilation. The patient is hemodynamically stable.now on ATC  PULMONARY A:   1) Post op of acoustic neuroma resection 2) Tracheostomy P:   -push to  ATC 24/7 -BDs - VAP prevention order set.  - Rest of management per primary team.  CV: A: Mild HTN P:increase Beta blocker to 50mg  bid  Renal: A: no acute issues P: monitor  ID: A: moderate secretions, no acute issues P: prophy abx  GI: A: no issues P: Monitor  HEME: A: No issues P: monitor  Neuro: A: acoustic neuroma, pain management P: per neurosurgery     Critical Care Time devoted to patient care services described in this note is: Harrison WrightMD Beeper  [phone]  Cell  [phone]  If no response or cell goes to voicemail, call beeper [phone] Pulmonary and [place] Pager: [phone]  10/13/2012, 8:50 AM

## 2012-10-14 ENCOUNTER — Inpatient Hospital Stay (HOSPITAL_COMMUNITY): Payer: BC Managed Care – PPO

## 2012-10-14 DIAGNOSIS — D333 Benign neoplasm of cranial nerves: Principal | ICD-10-CM

## 2012-10-14 LAB — CBC
HCT: 41.7 % (ref 39.0–52.0)
Hemoglobin: 13.3 g/dL (ref 13.0–17.0)
MCV: 93.7 fL (ref 78.0–100.0)
RBC: 4.45 MIL/uL (ref 4.22–5.81)
RDW: 13.6 % (ref 11.5–15.5)
WBC: 17.1 10*3/uL — ABNORMAL HIGH (ref 4.0–10.5)

## 2012-10-14 LAB — BASIC METABOLIC PANEL
CO2: 31 mEq/L (ref 19–32)
Chloride: 109 mEq/L (ref 96–112)
Creatinine, Ser: 1.09 mg/dL (ref 0.50–1.35)

## 2012-10-14 MED ORDER — FUROSEMIDE 10 MG/ML IJ SOLN
40.0000 mg | Freq: Once | INTRAMUSCULAR | Status: AC
  Administered 2012-10-14: 40 mg via INTRAVENOUS
  Filled 2012-10-14: qty 4

## 2012-10-14 MED ORDER — ACETAMINOPHEN 10 MG/ML IV SOLN
1000.0000 mg | Freq: Four times a day (QID) | INTRAVENOUS | Status: AC
  Administered 2012-10-14 – 2012-10-15 (×4): 1000 mg via INTRAVENOUS
  Filled 2012-10-14 (×4): qty 100

## 2012-10-14 NOTE — Progress Notes (Signed)
Subjective: Pt awake and responsive.  Back on vent due to desat.  Venting well.  Objective: Vital signs in last 24 hours: Temp:  [98 F (36.7 C)-101 F (38.3 C)] 99.3 F (37.4 C) (02/24 1100) Pulse Rate:  [79-117] 79 (02/24 1100) Resp:  [14-23] 16 (02/24 1100) BP: (118-177)/(61-98) 146/72 mmHg (02/24 1100) SpO2:  [91 %-97 %] 91 % (02/24 1100) FiO2 (%):  [40 %-50 %] 40 % (02/24 0851)  Awake and responsive.  Able to communicate via gestures.  Right facial palsy noted. Incomplete eye closure. Trach in place. Good air movement.  Recent Labs  10/13/12 0815 10/14/12 0800  WBC 28.9* 17.1*  HGB 15.2 13.3  HCT 45.2 41.7  PLT 322 276    Recent Labs  10/13/12 0815 10/14/12 0800  NA 142 145  K 4.6 4.8  CL 106 109  CO2 26 31  GLUCOSE 186* 168*  BUN 33* 34*  CREATININE 1.56* 1.09  CALCIUM 9.4 9.4    Medications:  I have reviewed the patient's current medications. Scheduled: . acetaminophen  1,000 mg Intravenous Q6H  . albuterol  2.5 mg Nebulization Q6H  . antiseptic oral rinse  15 mL Mouth Rinse QID  . artificial tears   Right Eye Q8H  . atenolol  50 mg Oral BID  . chlorhexidine  15 mL Mouth Rinse BID  . dexamethasone  4 mg Intravenous Q8H  . furosemide  40 mg Intravenous Once  . ipratropium  0.5 mg Nebulization Q6H  . pantoprazole (PROTONIX) IV  40 mg Intravenous QHS  . senna  1 tablet Oral BID   [id number], fentaNYL, HYDROcodone-acetaminophen, labetalol, naLOXone (NARCAN)  injection, ondansetron (ZOFRAN) IV, ondansetron, oxyCODONE, promethazine  Assessment/Plan: POD #3 s/p right AN resection. Right facial palsy noted. Will need lacrilube to prevent corneal drying. Wean vent as tolerated.  Will change to uncuffed trach and passy muir valve when tolerating TC.    LOS: 3 days   Jimmy Plessinger,SUI W 10/14/2012, 12:00 PM

## 2012-10-14 NOTE — Progress Notes (Signed)
Inpatient Diabetes Program Recommendations  AACE/ADA: New Consensus Statement on Inpatient Glycemic Control (2013)  Target Ranges:  Prepandial:   less than 140 mg/dL      Peak postprandial:   less than 180 mg/dL (1-2 hours)      Critically ill patients:  140 - 180 mg/dL   Reason for Visit: Elevated lab glucose on Decadron  Inpatient Diabetes Program Recommendations HgbA1C: Please check.  Last one done in 2009 was 5.9% (at risk for DM)  Note: Thank you, Rosita Kea, RN, CNS, Diabetes Coordinator [phone])

## 2012-10-14 NOTE — Progress Notes (Signed)
Patient ID: [name], male   DOB: September 21, 1960, 52 y.o.   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 Continue with current treatment.

## 2012-10-14 NOTE — Progress Notes (Signed)
SLP Cancellation Note  Patient Details Name: [name] MRN: [id number] DOB: January 24, 1961   Cancelled treatment:       Reason Eval/Treat Not Completed: Patient not medically ready. Per chart pt back on vent. Not appropriate for bedside swallow assessment at this time. Pt will also need Passy-Muir speaking valve order. SLP will check chart for readiness tomorrow.   Herbie [place], [place] CCC-SLP [phone]  Lynann Beaver 10/14/2012, 12:08 PM

## 2012-10-14 NOTE — Progress Notes (Signed)
UR completed 

## 2012-10-14 NOTE — Progress Notes (Signed)
NUTRITION FOLLOW UP  Intervention:   Diet per SLP, will supplement as needed.  If unable to advance diet recommend initiating enteral nutrition support. Recommend Jevity 1.2 @ 20 ml/hr, increasing by 10 ml every 4 hours to goal rate of 60 ml/hr with 30 ml Prostat TID. At goal will provide 2028 kcal, 125 grams protein, and 1166 ml H2O.   Nutrition Dx:   Inadequate oral intake related to inability to eat as evidenced by NPO status.  Goal: Enteral nutrition to provide 60-70% of estimated calorie needs (22-25 kcals/kg ideal body weight) and 100% of estimated protein needs, based on ASPEN guidelines for permissive underfeeding in critically ill obese individuals; NA  New Goal:   Pt to meet >/= 90% of their estimated nutrition needs.   Monitor:   Vent status, weight, swallow function  Assessment:   Pt discussed during rounds with RN and RT. Pt is weaning on trach collar currently. Per RT she feels pt will tolerate wean. Pt with very large tumor removed. SLP consult not yet ordered, per RN she will address with MD today.  Pt has not had any nutrition since admission.   Height: Ht Readings from Last 1 Encounters:  10/12/12 5\' 11"  (1.803 m)    Weight Status:   Wt Readings from Last 1 Encounters:  10/13/12 385 lb 12.9 oz (175 kg)    Re-estimated needs:  Kcal: 2000-2300 Protein: 115-130 grams Fluid: > 2 L/day  Skin: head and neck incision, skin tear, left leg large broken blister, fluid filled blisters  Diet Order: NPO   Intake/Output Summary (Last 24 hours) at 10/14/12 1109 Last data filed at 10/14/12 1000  Gross per 24 hour  Intake   1990 ml  Output   1945 ml  Net     45 ml    Last BM: 2/23   Labs:   Recent Labs Lab 10/12/12 0100 10/13/12 0815 10/14/12 0800  NA 140 142 145  K 4.6 4.6 4.8  CL 103 106 109  CO2 23 26 31   BUN 15 33* 34*  CREATININE 0.96 1.56* 1.09  CALCIUM 8.7 9.4 9.4  GLUCOSE 193* 186* 168*    CBG (last 3)   Recent Labs  10/12/12 0324   GLUCAP 167*    Scheduled Meds: . albuterol  2.5 mg Nebulization Q6H  . antiseptic oral rinse  15 mL Mouth Rinse QID  . artificial tears   Right Eye Q8H  . atenolol  50 mg Oral BID  . chlorhexidine  15 mL Mouth Rinse BID  . dexamethasone  4 mg Intravenous Q8H  . ipratropium  0.5 mg Nebulization Q6H  . pantoprazole (PROTONIX) IV  40 mg Intravenous QHS  . senna  1 tablet Oral BID    Continuous Infusions: . 0.9 % NaCl with KCl 20 mEq / L 80 mL/hr at 10/14/12 1000  . fentaNYL infusion INTRAVENOUS 100 mcg/hr (10/14/12 1000)  . propofol Stopped (10/12/12 1159)    [place], [place], [place] Pager [phone] After Hours Pager

## 2012-10-14 NOTE — Progress Notes (Signed)
Name: [name] MRN: [id number] DOB: [birth year]    LOS: 3  PULMONARY / CRITICAL CARE MEDICINE   Consult requested by: Dr. Christella Noa Reason: Mechanical ventilation management. Brief Hx:   52 yo MO WM s/p trach and R acoustic neuroma resection. PCCM consulted for postop vent care.  Current Status: On vent  Vital Signs: Temp:  [98 F (36.7 C)-101 F (38.3 C)] 101 F (38.3 C) (02/24 0900) Pulse Rate:  [80-117] 80 (02/24 1000) Resp:  [14-23] 17 (02/24 1000) BP: (118-177)/(61-98) 154/71 mmHg (02/24 1000) SpO2:  [91 %-97 %] 91 % (02/24 1000) FiO2 (%):  [40 %-50 %] 40 % (02/24 0851)  Physical Examination: General: on vent Neuro:alert HEENT:  PERRL, pink conjunctivae, moist membranes Neck:  Supple, no JVD   Cardiovascular:  RRR, no M/R/G Lungs:  Bilateral diminished air entry, no W/R/R Abdomen:  Soft, nontender, nondistended, bowel sounds present Musculoskeletal:  Moves all extremities, no pedal edema Skin:  No rash  I have reviewed the labs and personally reviewed the radiology imaging since admission.  Results for orders placed during the hospital encounter of 10/11/12 (from the past 24 hour(s))  BASIC METABOLIC PANEL     Status: Abnormal   Collection Time    10/14/12  8:00 AM      Result Value Range   Sodium 145  135 - 145 mEq/L   Potassium 4.8  3.5 - 5.1 mEq/L   Chloride 109  96 - 112 mEq/L   CO2 31  19 - 32 mEq/L   Glucose, Bld 168 (*) 70 - 99 mg/dL   BUN 34 (*) 6 - 23 mg/dL   Creatinine, Ser 1.09  0.50 - 1.35 mg/dL   Calcium 9.4  8.4 - 10.5 mg/dL   GFR calc non Af Amer 77 (*) >90 mL/min   GFR calc Af Amer 89 (*) >90 mL/min  CBC     Status: Abnormal   Collection Time    10/14/12  8:00 AM      Result Value Range   WBC 17.1 (*) 4.0 - 10.5 K/uL   RBC 4.45  4.22 - 5.81 MIL/uL   Hemoglobin 13.3  13.0 - 17.0 g/dL   HCT 41.7  39.0 - 52.0 %   MCV 93.7  78.0 - 100.0 fL   MCH 29.9  26.0 - 34.0 pg   MCHC 31.9  30.0 - 36.0 g/dL   RDW 13.6  11.5 - 15.5 %   Platelets 276  150 - 400 K/uL    Ventilator Settings: Vent Mode:  [-] PSV FiO2 (%):  [40 %-50 %] 40 % Set Rate:  [16 bmp] 16 bmp Vt Set:  [600 mL] 600 mL Pressure Support:  [5 cmH20] 5 cmH20 Plateau Pressure:  [24 cmH20-26 cmH20] 25 cmH20  ETT:  Tracheostomy (10/11/12) ECG:  NSR Lines: Peripheral lines  ASSESSMENT AND PLAN 52 years old male with PMH relevant for morbid obesity, CAD post CABG, CHF, HTN, DM. Presents electively for acustic neuroma resection. Consult called to assist with management of mechanical ventilation. The patient is hemodynamically stable.now on ATC  PULMONARY A:   1) Post op of acoustic neuroma resection 2) Tracheostomy 3) r/o fluid edema / atx  P:   -push to ATC 24/7 as goal -BDs  -may need neg balance, will repeat pcxr assessment on rt -will assess abg at 4 hrs trach collar, see last abg on 2/22 -frequent suctioning needed -pmv attempt, pending secretion status  CV: A: Mild HTN P:increase Beta blocker to  50mg  bid Lasix addition x 1  Renal: A:error chem day prior, edema? P: monitor chem in am , lasix x 1  ID: A: moderate secretions, no acute issues P: pcxr follow up on rt  GI: A: r/o dysphagia P: assess slp once on trach collar and cuff down  HEME: A: dvt prevention P: scd  Neuro: A: acoustic neuroma, pain management P: per neurosurgery  Ccm time 30 min  Lavon Paganini. Titus Mould, MD, [place] Pgr: [facility]

## 2012-10-15 ENCOUNTER — Encounter (HOSPITAL_COMMUNITY): Payer: Self-pay | Admitting: Neurosurgery

## 2012-10-15 ENCOUNTER — Inpatient Hospital Stay (HOSPITAL_COMMUNITY): Payer: BC Managed Care – PPO

## 2012-10-15 DIAGNOSIS — J95821 Acute postprocedural respiratory failure: Secondary | ICD-10-CM

## 2012-10-15 LAB — POCT I-STAT 4, (NA,K, GLUC, HGB,HCT)
Glucose, Bld: 178 mg/dL — ABNORMAL HIGH (ref 70–99)
HCT: 48 % (ref 39.0–52.0)
HCT: 47 % (ref 39.0–52.0)
Hemoglobin: 16 g/dL (ref 13.0–17.0)
Sodium: 142 mEq/L (ref 135–145)

## 2012-10-15 LAB — POCT I-STAT 7, (LYTES, BLD GAS, ICA,H+H)
Acid-base deficit: 3 mmol/L — ABNORMAL HIGH (ref 0.0–2.0)
Acid-base deficit: 3 mmol/L — ABNORMAL HIGH (ref 0.0–2.0)
Bicarbonate: 23.1 mEq/L (ref 20.0–24.0)
Calcium, Ion: 1.26 mmol/L — ABNORMAL HIGH (ref 1.12–1.23)
Calcium, Ion: 1.16 mmol/L (ref 1.12–1.23)
HCT: 45 % (ref 39.0–52.0)
HCT: 45 % (ref 39.0–52.0)
HCT: 43 % (ref 39.0–52.0)
Hemoglobin: 15.3 g/dL (ref 13.0–17.0)
O2 Saturation: 99 %
Patient temperature: 36
Potassium: 4.7 mEq/L (ref 3.5–5.1)
Potassium: 4.7 mEq/L (ref 3.5–5.1)
Sodium: 138 mEq/L (ref 135–145)
Sodium: 140 mEq/L (ref 135–145)
TCO2: 21 mmol/L (ref 0–100)
pCO2 arterial: 41.9 mmHg (ref 35.0–45.0)
pCO2 arterial: 40.6 mmHg (ref 35.0–45.0)
pH, Arterial: 7.294 — ABNORMAL LOW (ref 7.350–7.450)
pO2, Arterial: 342 mmHg — ABNORMAL HIGH (ref 80.0–100.0)

## 2012-10-15 LAB — BLOOD GAS, ARTERIAL
Drawn by: 347621
PEEP: 5 cmH2O
Patient temperature: 98.4
TCO2: 34.2 mmol/L (ref 0–100)
pCO2 arterial: 57 mmHg — ABNORMAL HIGH (ref 35.0–45.0)
pH, Arterial: 7.373 (ref 7.350–7.450)

## 2012-10-15 LAB — BASIC METABOLIC PANEL
CO2: 31 mEq/L (ref 19–32)
Calcium: 9.6 mg/dL (ref 8.4–10.5)
Creatinine, Ser: 0.88 mg/dL (ref 0.50–1.35)
Glucose, Bld: 163 mg/dL — ABNORMAL HIGH (ref 70–99)

## 2012-10-15 LAB — MAGNESIUM: Magnesium: 2.3 mg/dL (ref 1.5–2.5)

## 2012-10-15 MED ORDER — PRO-STAT SUGAR FREE PO LIQD
90.0000 mL | Freq: Four times a day (QID) | ORAL | Status: DC
  Administered 2012-10-15 – 2012-10-21 (×23): 90 mL
  Filled 2012-10-15 (×28): qty 90

## 2012-10-15 MED ORDER — FUROSEMIDE 10 MG/ML IJ SOLN
40.0000 mg | Freq: Two times a day (BID) | INTRAMUSCULAR | Status: AC
  Administered 2012-10-15 (×2): 40 mg via INTRAVENOUS
  Filled 2012-10-15 (×2): qty 4

## 2012-10-15 MED ORDER — PROMOTE PO LIQD
1000.0000 mL | ORAL | Status: DC
  Administered 2012-10-15 – 2012-10-20 (×6): 1000 mL
  Filled 2012-10-15 (×8): qty 1000

## 2012-10-15 MED ORDER — DEXTROSE 5 % IV SOLN
INTRAVENOUS | Status: DC
  Administered 2012-10-15 – 2012-10-17 (×2): via INTRAVENOUS
  Administered 2012-10-18: 1000 mL via INTRAVENOUS
  Administered 2012-10-20: 500 mL via INTRAVENOUS
  Administered 2012-10-21: 1000 mL via INTRAVENOUS

## 2012-10-15 NOTE — Progress Notes (Signed)
SLP Cancellation Note  Patient Details Name: [name] MRN: [id number] DOB: September 15, 1960   Cancelled treatment:       Reason Eval/Treat Not Completed: Medical issues which prohibited therapy. Patient on ventilator. SLP will f/u when medically ready.  Henry Russel SLP student Henry Russel 10/15/2012, 8:19 AM

## 2012-10-15 NOTE — Progress Notes (Signed)
Subjective: Still on vent.  Awake and responsive. No issues overnight  Objective: Vital signs in last 24 hours: Temp:  [97.7 F (36.5 C)-100.3 F (37.9 C)] 97.7 F (36.5 C) (02/25 0400) Pulse Rate:  [52-99] 52 (02/25 0700) Resp:  [15-19] 17 (02/25 0700) BP: (130-182)/(63-102) 182/76 mmHg (02/25 0700) SpO2:  [88 %-96 %] 95 % (02/25 0700) FiO2 (%):  [40 %-50 %] 50 % (02/25 0507)  Awake and responsive.  Able to communicate via gestures.  Right facial palsy noted. Incomplete eye closure.  Trach in place. Good air movement.  Recent Labs  10/13/12 0815 10/14/12 0800  WBC 28.9* 17.1*  HGB 15.2 13.3  HCT 45.2 41.7  PLT 322 276    Recent Labs  10/14/12 0800 10/15/12 0535  NA 145 147*  K 4.8 4.4  CL 109 107  CO2 31 31  GLUCOSE 168* 163*  BUN 34* 35*  CREATININE 1.09 0.88  CALCIUM 9.4 9.6    Medications:  I have reviewed the patient's current medications. Scheduled: . albuterol  2.5 mg Nebulization Q6H  . antiseptic oral rinse  15 mL Mouth Rinse QID  . artificial tears   Right Eye Q8H  . atenolol  50 mg Oral BID  . chlorhexidine  15 mL Mouth Rinse BID  . dexamethasone  4 mg Intravenous Q8H  . ipratropium  0.5 mg Nebulization Q6H  . pantoprazole (PROTONIX) IV  40 mg Intravenous QHS  . senna  1 tablet Oral BID   [id number], fentaNYL, HYDROcodone-acetaminophen, labetalol, naLOXone (NARCAN)  injection, ondansetron (ZOFRAN) IV, ondansetron, oxyCODONE, promethazine  Assessment/Plan: POD #4 s/p right AN resection. Right facial palsy noted. Will need lacrilube at night to prevent corneal drying. Wean vent as tolerated. Will change to uncuffed trach and passy muir valve when tolerating TC.    LOS: 4 days   [name],SUI W 10/15/2012, 9:34 AM

## 2012-10-15 NOTE — Progress Notes (Signed)
SLP reviewed and agree with student findings.   [place], [place] [phone]

## 2012-10-15 NOTE — Progress Notes (Signed)
Name: [name] MRN: [id number] DOB: Dec 26, 1960    LOS: 4  PULMONARY / CRITICAL CARE MEDICINE   Consult requested by: Dr. Christella Noa Reason: Mechanical ventilation management. Brief Hx:   51 yo MO WM s/p trach and R acoustic neuroma resection. PCCM consulted for postop vent care.  Current Status: On vent, failed trach collar yesterday (desat and increased rate)  Vital Signs: Temp:  [97.7 F (36.5 C)-100.3 F (37.9 C)] 97.7 F (36.5 C) (02/25 0400) Pulse Rate:  [52-99] 56 (02/25 0948) Resp:  [15-19] 17 (02/25 0948) BP: (130-182)/(63-102) 168/75 mmHg (02/25 0948) SpO2:  [88 %-97 %] 97 % (02/25 0948) FiO2 (%):  [40 %-50 %] 50 % (02/25 0507)  Physical Examination: General: on vent Neuro:alert HEENT:  PERRL, pink conjunctivae, moist membranes Neck:  Supple, no JVD   Cardiovascular:  RRR, no M/R/G Lungs:  Bilateral diminished air entry, no W/R/R Abdomen:  Soft, nontender, nondistended, bowel sounds present Musculoskeletal:  Moves all extremities, no pedal edema Skin:  No rash  I have reviewed the labs and personally reviewed the radiology imaging since admission.  Results for orders placed during the hospital encounter of 10/11/12 (from the past 24 hour(s))  CULTURE, RESPIRATORY (NON-EXPECTORATED)     Status: None   Collection Time    10/14/12  1:29 PM      Result Value Range   Specimen Description TRACHEAL ASPIRATE     Special Requests NONE     Gram Stain       Value: ABUNDANT WBC PRESENT,BOTH PMN AND MONONUCLEAR     RARE SQUAMOUS EPITHELIAL CELLS PRESENT     RARE GRAM NEGATIVE RODS   Culture MODERATE GRAM NEGATIVE RODS     Report Status PENDING    BASIC METABOLIC PANEL     Status: Abnormal   Collection Time    10/15/12  5:35 AM      Result Value Range   Sodium 147 (*) 135 - 145 mEq/L   Potassium 4.4  3.5 - 5.1 mEq/L   Chloride 107  96 - 112 mEq/L   CO2 31  19 - 32 mEq/L   Glucose, Bld 163 (*) 70 - 99 mg/dL   BUN 35 (*) 6 - 23 mg/dL   Creatinine, Ser 0.88   0.50 - 1.35 mg/dL   Calcium 9.6  8.4 - 10.5 mg/dL   GFR calc non Af Amer >90  >90 mL/min   GFR calc Af Amer >90  >90 mL/min  PHOSPHORUS     Status: None   Collection Time    10/15/12  5:35 AM      Result Value Range   Phosphorus 2.4  2.3 - 4.6 mg/dL  MAGNESIUM     Status: None   Collection Time    10/15/12  5:35 AM      Result Value Range   Magnesium 2.3  1.5 - 2.5 mg/dL    Ventilator Settings: Vent Mode:  [-] PRVC FiO2 (%):  [40 %-50 %] 50 % Set Rate:  [16 bmp] 16 bmp Vt Set:  [600 mL] 600 mL PEEP:  [5 cmH20] 5 cmH20 Plateau Pressure:  [17 cmH20-26 cmH20] 18 cmH20  ETT:  Tracheostomy (10/11/12) ECG:  NSR Lines: Peripheral lines  ASSESSMENT AND PLAN 52 years old male with PMH relevant for morbid obesity, CAD post CABG, CHF, HTN, DM. Presents electively for acustic neuroma resection. Consult called to assist with management of mechanical ventilation. The patient is hemodynamically stable.now on ATC  PULMONARY A:   1)  Post op of acoustic neuroma resection 2) Tracheostomy 3) r/o fluid edema / atx  P:   - Goal trach collar, failed yesterday, attempt longer time on PS today, goal 24 hrs on spontaneous, abg at 8 pm  - net positive balance yesterday, CXR suggestive of pulmonary edema.  Will give additional lasix -pcxr to ensure no collapse  CV: A: Mild HTN P: Lasix addition x 2 Consider adding ACE/ARB for further bp control (not able to increase BB due to low heart rate)  Renal: A: Hypernatremia, pulm edema P: monitor chem in am , llasix increase Add d5w at 30 cc/hr for NA  ID: A: moderate secretions, no acute issues P: pcxr in am, if spike add ceftaz  GI: A: r/o dysphagia P: assess slp once on trach collar and cuff down If it appears wean will be long, will need NGT and feeds, will have RN d/w NS safety of NGT placement  HEME: A: dvt prevention P: scd  Neuro: A: acoustic neuroma, pain management P: per neurosurgery  Can transfer to vent sdu bed when NS  observation not needed in ICU setting  Lavon Paganini. Titus Mould, MD, [place] Pgr: [place] [facility]

## 2012-10-15 NOTE — Progress Notes (Signed)
NUTRITION FOLLOW UP  Intervention:   Promote @ 20 ml/hr with 90 ml Prostat QID   At goal will provide: 1680 kcal (63% of needs), 210 grams protein (> 100% of minimum needs), and 459ml H2O.  Nutrition Dx:   Inadequate oral intake related to inability to eat as evidenced by NPO status.  Goal: Enteral nutrition to provide 60-70% of estimated calorie needs (22-25 kcals/kg ideal body weight) and 100% of estimated protein needs, based on ASPEN guidelines for permissive underfeeding in critically ill obese individuals; not met  Pt to meet >/= 90% of their estimated nutrition needs; NA  Monitor:   Vent status, weight, swallow function  Assessment:   Pt with very large tumor removed.  Pt discussed during rounds with RN  Per RN pt has not been able to tolerate weaning to TC.  Pt has not had any nutrition since admission.   Patient remains on ventilator support.  MV: 7.8 Temp:Temp (24hrs), Avg:98.9 F (37.2 C), Min:97.7 F (36.5 C), Max:100.3 F (37.9 C)   Height: Ht Readings from Last 1 Encounters:  10/12/12 5\' 11"  (1.803 m)    Weight Status:   Wt Readings from Last 1 Encounters:  10/13/12 385 lb 12.9 oz (175 kg)  Admission weight 361 lb  Re-estimated needs:  Kcal: 2660 Protein: 195 grams Fluid: > 2 L/day  Skin: head and neck incision, skin tear, left leg large broken blister, fluid filled blisters  Diet Order: NPO   Intake/Output Summary (Last 24 hours) at 10/15/12 1255 Last data filed at 10/15/12 1200  Gross per 24 hour  Intake   2360 ml  Output    830 ml  Net   1530 ml    Last BM: 2/23   Labs:   Recent Labs Lab 10/13/12 0815 10/14/12 0800 10/15/12 0535  NA 142 145 147*  K 4.6 4.8 4.4  CL 106 109 107  CO2 26 31 31   BUN 33* 34* 35*  CREATININE 1.56* 1.09 0.88  CALCIUM 9.4 9.4 9.6  MG  --   --  2.3  PHOS  --   --  2.4  GLUCOSE 186* 168* 163*    CBG (last 3)  No results found for this basename: GLUCAP,  in the last 72 hours  Scheduled Meds: .  albuterol  2.5 mg Nebulization Q6H  . antiseptic oral rinse  15 mL Mouth Rinse QID  . artificial tears   Right Eye Q8H  . atenolol  50 mg Oral BID  . chlorhexidine  15 mL Mouth Rinse BID  . dexamethasone  4 mg Intravenous Q8H  . furosemide  40 mg Intravenous BID  . ipratropium  0.5 mg Nebulization Q6H  . pantoprazole (PROTONIX) IV  40 mg Intravenous QHS  . senna  1 tablet Oral BID    Continuous Infusions: . dextrose    . fentaNYL infusion INTRAVENOUS 100 mcg/hr (10/15/12 1200)    [place], [place], [place] Pager [phone] After Hours Pager

## 2012-10-15 NOTE — Progress Notes (Signed)
Patient ID: [name], male   DOB: 03-25-1961, 52 y.o.   MRN: [id number] BP 149/65  Pulse 83  Temp(Src) 98.6 F (37 C) (Oral)  Resp 17  Ht 5\' 11"  (1.803 m)  Wt 175 kg (385 lb 12.9 oz)  BMI 53.83 kg/m2  SpO2 95% Alert and oriented x 3 Moving all extremities Right 7th nerve weakness Wound is clean dry and without signs of infection Transfer tomorrow

## 2012-10-16 ENCOUNTER — Inpatient Hospital Stay (HOSPITAL_COMMUNITY): Payer: BC Managed Care – PPO

## 2012-10-16 DIAGNOSIS — I25810 Atherosclerosis of coronary artery bypass graft(s) without angina pectoris: Secondary | ICD-10-CM

## 2012-10-16 LAB — GLUCOSE, CAPILLARY
Glucose-Capillary: 154 mg/dL — ABNORMAL HIGH (ref 70–99)
Glucose-Capillary: 145 mg/dL — ABNORMAL HIGH (ref 70–99)

## 2012-10-16 LAB — BASIC METABOLIC PANEL
CO2: 34 mEq/L — ABNORMAL HIGH (ref 19–32)
Calcium: 9.9 mg/dL (ref 8.4–10.5)
Chloride: 104 mEq/L (ref 96–112)
Creatinine, Ser: 0.89 mg/dL (ref 0.50–1.35)
Glucose, Bld: 189 mg/dL — ABNORMAL HIGH (ref 70–99)

## 2012-10-16 LAB — CULTURE, RESPIRATORY W GRAM STAIN

## 2012-10-16 MED ORDER — INSULIN ASPART 100 UNIT/ML ~~LOC~~ SOLN
0.0000 [IU] | SUBCUTANEOUS | Status: DC
  Administered 2012-10-16: 2 [IU] via SUBCUTANEOUS
  Administered 2012-10-16: 1 [IU] via SUBCUTANEOUS
  Administered 2012-10-16 – 2012-10-17 (×4): 2 [IU] via SUBCUTANEOUS
  Administered 2012-10-17: 1 [IU] via SUBCUTANEOUS
  Administered 2012-10-17 (×2): 2 [IU] via SUBCUTANEOUS
  Administered 2012-10-18: 1 [IU] via SUBCUTANEOUS
  Administered 2012-10-18: 2 [IU] via SUBCUTANEOUS
  Administered 2012-10-18: 1 [IU] via SUBCUTANEOUS
  Administered 2012-10-18 – 2012-10-19 (×8): 2 [IU] via SUBCUTANEOUS
  Administered 2012-10-19: 1 [IU] via SUBCUTANEOUS
  Administered 2012-10-20 (×3): 2 [IU] via SUBCUTANEOUS
  Administered 2012-10-20 (×2): 1 [IU] via SUBCUTANEOUS
  Administered 2012-10-20 – 2012-10-21 (×2): 2 [IU] via SUBCUTANEOUS

## 2012-10-16 NOTE — Progress Notes (Signed)
Name: [name] MRN: [id number] DOB: 10-Jan-1961    LOS: 5  PULMONARY / CRITICAL CARE MEDICINE   Consult requested by: Dr. Christella Noa Reason: Mechanical ventilation management. Brief Hx:   52 yo MO WM s/p trach and R acoustic neuroma resection. PCCM consulted for postop vent care.  Current Status: Did well on cpap ps, secretions are excessive  Vital Signs: Temp:  [98.4 F (36.9 C)-99.1 F (37.3 C)] 99.1 F (37.3 C) (02/26 0800) Pulse Rate:  [55-83] 60 (02/26 1145) Resp:  [10-24] 18 (02/26 1145) BP: (102-162)/(53-113) 136/60 mmHg (02/26 1145) SpO2:  [89 %-100 %] 96 % (02/26 1145) FiO2 (%):  [40 %] 40 % (02/26 1145) Weight:  [170.1 kg (375 lb)] 170.1 kg (375 lb) (02/26 0500)  Physical Examination: General: on vent trach collar re attempt Neuro:alert HEENT:  PERRL, pink conjunctivae, moist membranes Neck:  Supple, no JVD   Cardiovascular:  RRR, no M/R/G Lungs:  Reduced overall Abdomen:  Soft, nontender, nondistended, bowel sounds present Musculoskeletal:  Moves all extremities, no pedal edema Skin:  No rash  I have reviewed the labs and personally reviewed the radiology imaging since admission.  Results for orders placed during the hospital encounter of 10/11/12 (from the past 24 hour(s))  BLOOD GAS, ARTERIAL     Status: Abnormal   Collection Time    10/15/12  8:40 PM      Result Value Range   FIO2 0.40     Delivery systems VENTILATOR     Mode PRESSURE SUPPORT     Peep/cpap 5.0     Pressure support 5     pH, Arterial 7.373  7.350 - 7.450   pCO2 arterial 57.0 (*) 35.0 - 45.0 mmHg   pO2, Arterial 94.7  80.0 - 100.0 mmHg   Bicarbonate 32.5 (*) 20.0 - 24.0 mEq/L   TCO2 34.2  0 - 100 mmol/L   Acid-Base Excess 7.3 (*) 0.0 - 2.0 mmol/L   O2 Saturation 97.2     Patient temperature 98.4     Collection site RIGHT RADIAL     Drawn by [id number]     Sample type ARTERIAL     Allens test (pass/fail) PASS  PASS  BASIC METABOLIC PANEL     Status: Abnormal   Collection Time     10/16/12  4:45 AM      Result Value Range   Sodium 146 (*) 135 - 145 mEq/L   Potassium 4.1  3.5 - 5.1 mEq/L   Chloride 104  96 - 112 mEq/L   CO2 34 (*) 19 - 32 mEq/L   Glucose, Bld 189 (*) 70 - 99 mg/dL   BUN 43 (*) 6 - 23 mg/dL   Creatinine, Ser 0.89  0.50 - 1.35 mg/dL   Calcium 9.9  8.4 - 10.5 mg/dL   GFR calc non Af Amer >90  >90 mL/min   GFR calc Af Amer >90  >90 mL/min    Ventilator Settings: Vent Mode:  [-] PSV FiO2 (%):  [40 %] 40 % PEEP:  [5 cmH20] 5 cmH20 Pressure Support:  [5 cmH20] 5 cmH20  ETT:  Tracheostomy (10/11/12) ECG:  NSR Lines: Peripheral lines  ASSESSMENT AND PLAN 52 years old male with PMH relevant for morbid obesity, CAD post CABG, CHF, HTN, DM. Presents electively for acustic neuroma resection. Consult called to assist with management of mechanical ventilation. The patient is hemodynamically stable.now on ATC  PULMONARY A:   1) Post op of acoustic neuroma resection 2) Tracheostomy 3) r/o  fluid edema / atx  4) pulm edema componeent P:   - re attempt trach collar to goal 8 hrs or further would allow -pcxr improved after lasix, maintain -if he fails trach collar abrupty again and secretions continue to be excessive, would consider bronch -can use CPAP PS nocturnal  CV: A: Mild HTN improved P: Lasix  Atenolol If needed add hydralazine as slight brady  Renal: A: Hypernatremia, pulm edema P: monitor chem in am Lasix to remain Add d5w at 30 cc/hr continue with same lasix dose  ID: A: moderate secretions, no acute issues P: pcxr slight improved  GI: A: r/o dysphagia P: once on trach collar 2 hrs can slp NGT, TF  HEME: A: dvt prevention P: scd  ENDO slight hyperglycemia Add ssi  Neuro: A: acoustic neuroma, pain management P: per neurosurgery, pt if ok by NS  PLAN: TRAch collr re attempt, if fail, consider bronch with such excessive secretions  Lavon Paganini. Titus Mould, MD, [place] Pgr: [place] [facility] [facility]

## 2012-10-16 NOTE — Progress Notes (Signed)
Subjective: Still on vent. Awake and responsive. Communicating well using gestures and writing.   Objective: Vital signs in last 24 hours: Temp:  [98.4 F (36.9 C)-99.1 F (37.3 C)] 99.1 F (37.3 C) (02/26 0800) Pulse Rate:  [55-83] 58 (02/26 0930) Resp:  [10-24] 14 (02/26 0930) BP: (102-162)/(53-113) 146/53 mmHg (02/26 0930) SpO2:  [89 %-100 %] 100 % (02/26 0930) FiO2 (%):  [40 %] 40 % (02/26 0932) Weight:  [170.1 kg (375 lb)] 170.1 kg (375 lb) (02/26 0500)  Awake and responsive.  Able to communicate via gestures.  Right facial palsy noted. Incomplete eye closure.  Trach in place. Good air movement. Incision intact.  Recent Labs  10/14/12 0800  WBC 17.1*  HGB 13.3  HCT 41.7  PLT 276    Recent Labs  10/15/12 0535 10/16/12 0445  NA 147* 146*  K 4.4 4.1  CL 107 104  CO2 31 34*  GLUCOSE 163* 189*  BUN 35* 43*  CREATININE 0.88 0.89  CALCIUM 9.6 9.9    Medications:  I have reviewed the patient's current medications. Scheduled: . albuterol  2.5 mg Nebulization Q6H  . antiseptic oral rinse  15 mL Mouth Rinse QID  . artificial tears   Right Eye Q8H  . atenolol  50 mg Oral BID  . chlorhexidine  15 mL Mouth Rinse BID  . dexamethasone  4 mg Intravenous Q8H  . feeding supplement  90 mL Per Tube QID  . feeding supplement (PROMOTE)  1,000 mL Per Tube Q24H  . ipratropium  0.5 mg Nebulization Q6H  . pantoprazole (PROTONIX) IV  40 mg Intravenous QHS  . senna  1 tablet Oral BID   [id number], fentaNYL, HYDROcodone-acetaminophen, labetalol, naLOXone (NARCAN)  injection, ondansetron (ZOFRAN) IV, ondansetron, oxyCODONE, promethazine  Assessment/Plan: POD #5 s/p right AN resection. Postop CT shows good tumor resection and decompression.  Right facial palsy noted. Will need lacrilube at night to prevent corneal drying. Wean vent per critical care. Will change to uncuffed trach and passy muir valve when tolerating TC.    LOS: 5 days   Aarav Burgett,SUI W 10/16/2012, 11:16 AM

## 2012-10-16 NOTE — Progress Notes (Signed)
Patient ID: [name], male   DOB: [birth year], 52 y.o.   MRN: [id number] BP 144/78  Pulse 58  Temp(Src) 98.4 F (36.9 C) (Oral)  Resp 15  Ht 5\' 11"  (1.803 m)  Wt 170.1 kg (375 lb)  BMI 52.33 kg/m2  SpO2 93% Alert and oriented Moving well Right facial weakness Pt tomorrow Wound is clean and dry.

## 2012-10-16 NOTE — Progress Notes (Signed)
SLP Cancellation Note  Patient Details Name: [name] MRN: [id number] DOB: [birth year]   Cancelled treatment:       Reason Eval/Treat Not Completed: Medical issues which prohibited therapy;Patient not medically ready. Patient on ventilator, SLP will f/u when patient medically ready.  Henry Russel SLP student Henry Russel 10/16/2012, 8:09 AM

## 2012-10-17 LAB — BASIC METABOLIC PANEL
CO2: 34 mEq/L — ABNORMAL HIGH (ref 19–32)
GFR calc non Af Amer: >90 mL/min (ref >90)
Glucose, Bld: 170 mg/dL — ABNORMAL HIGH (ref 70–99)
Potassium: 4.4 mEq/L (ref 3.5–5.1)
Sodium: 144 mEq/L (ref 135–145)

## 2012-10-17 LAB — GLUCOSE, CAPILLARY
Glucose-Capillary: 156 mg/dL — ABNORMAL HIGH (ref 70–99)
Glucose-Capillary: 159 mg/dL — ABNORMAL HIGH (ref 70–99)
Glucose-Capillary: 142 mg/dL — ABNORMAL HIGH (ref 70–99)

## 2012-10-17 LAB — PHOSPHORUS: Phosphorus: 3 mg/dL (ref 2.3–4.6)

## 2012-10-17 LAB — MAGNESIUM: Magnesium: 2.2 mg/dL (ref 1.5–2.5)

## 2012-10-17 NOTE — Progress Notes (Signed)
Name: [name] MRN: [id number] DOB: October 01, 1960    LOS: 6  PULMONARY / CRITICAL CARE MEDICINE   Consult requested by: Dr. Christella Noa Reason: Mechanical ventilation management. Brief Hx:   52 yo MO WM s/p trach and R acoustic neuroma resection. PCCM consulted for postop vent care.  Current Status: Did well on ATC, secretions are excessive but good cough Can pull himself up in bed  Vital Signs: Temp:  [97.5 F (36.4 C)-98.5 F (36.9 C)] 98.1 F (36.7 C) (02/27 0754) Pulse Rate:  [49-82] 71 (02/27 0916) Resp:  [14-23] 23 (02/27 0754) BP: (136-173)/(60-91) 164/79 mmHg (02/27 0916) SpO2:  [93 %-98 %] 95 % (02/27 0801) FiO2 (%):  [40 %] 40 % (02/27 1104) Weight:  [164.2 kg (361 lb 15.9 oz)] 164.2 kg (361 lb 15.9 oz) (02/27 0430)  Physical Examination: General: on  trach collar, obese Neuro:alert, rt fcial palsy HEENT:  PERRL, pink conjunctivae, moist membranes Neck:  Supple, no JVD   Cardiovascular:  RRR, no M/R/G Lungs:  Reduced overall Abdomen:  Soft, nontender, nondistended, bowel sounds present Musculoskeletal:  Moves all extremities, no pedal edema Skin:  No rash   Results for orders placed during the hospital encounter of 10/11/12 (from the past 24 hour(s))  GLUCOSE, CAPILLARY     Status: Abnormal   Collection Time    10/16/12 12:45 PM      Result Value Range   Glucose-Capillary 189 (*) 70 - 99 mg/dL  GLUCOSE, CAPILLARY     Status: Abnormal   Collection Time    10/16/12  3:44 PM      Result Value Range   Glucose-Capillary 154 (*) 70 - 99 mg/dL   Comment 1 Notify RN     Comment 2 Documented in Chart    GLUCOSE, CAPILLARY     Status: Abnormal   Collection Time    10/16/12  7:56 PM      Result Value Range   Glucose-Capillary 145 (*) 70 - 99 mg/dL  GLUCOSE, CAPILLARY     Status: Abnormal   Collection Time    10/17/12 12:03 AM      Result Value Range   Glucose-Capillary 183 (*) 70 - 99 mg/dL  GLUCOSE, CAPILLARY     Status: Abnormal   Collection Time     10/17/12  4:26 AM      Result Value Range   Glucose-Capillary 158 (*) 70 - 99 mg/dL  BASIC METABOLIC PANEL     Status: Abnormal   Collection Time    10/17/12  6:00 AM      Result Value Range   Sodium 144  135 - 145 mEq/L   Potassium 4.4  3.5 - 5.1 mEq/L   Chloride 101  96 - 112 mEq/L   CO2 34 (*) 19 - 32 mEq/L   Glucose, Bld 170 (*) 70 - 99 mg/dL   BUN 43 (*) 6 - 23 mg/dL   Creatinine, Ser 0.75  0.50 - 1.35 mg/dL   Calcium 10.1  8.4 - 10.5 mg/dL   GFR calc non Af Amer >90  >90 mL/min   GFR calc Af Amer >90  >90 mL/min  PHOSPHORUS     Status: None   Collection Time    10/17/12  6:00 AM      Result Value Range   Phosphorus 3.0  2.3 - 4.6 mg/dL  MAGNESIUM     Status: None   Collection Time    10/17/12  6:00 AM      Result  Value Range   Magnesium 2.2  1.5 - 2.5 mg/dL  GLUCOSE, CAPILLARY     Status: Abnormal   Collection Time    10/17/12  7:58 AM      Result Value Range   Glucose-Capillary 156 (*) 70 - 99 mg/dL   Comment 1 Notify RN      Ventilator Settings: Vent Mode:  [-]  FiO2 (%):  [40 %] 40 %  ETT:  Tracheostomy (10/11/12) ECG:  NSR Lines: Peripheral lines  ASSESSMENT AND PLAN 52 years old male with PMH relevant for morbid obesity, CAD post CABG, CHF, HTN, DM. Presents electively for acustic neuroma resection. Consult called to assist with management of mechanical ventilation. The patient is hemodynamically stable.now on ATC  PULMONARY A:   1) Post op of acoustic neuroma resection 2) Tracheostomy 3) r/o fluid edema / atx  4) pulm edema componeent P:   - re attempt trach collar to goal 24 h -can use CPAP PS nocturnal  CV: A: Mild HTN improved P: Lasix  Atenolol If needed add hydralazine as slight brady  Renal: A: Hypernatremia, pulm edema P: monitor chem in am Lasix to remain dc d5w   ID: A: moderate secretions, no acute issues P: pcxr slight improved  GI: A: r/o dysphagia P: NGT, TF Swallow eval  HEME: A: dvt prevention P:  scd  ENDO slight hyperglycemia Add ssi  Neuro: A: acoustic neuroma, pain management P: per neurosurgery, pt if ok by NS  PLAN: plan for PM valve  Jasmia Angst V.  [phone]

## 2012-10-17 NOTE — Progress Notes (Deleted)
Obliterator is missing from the head of bed. I have asked the RN and Tech both twice to please order a new trach so we can get a new one hung

## 2012-10-17 NOTE — Progress Notes (Signed)
Obliterator in the room. Did not see it prior to my last note

## 2012-10-17 NOTE — Progress Notes (Signed)
SLP Cancellation Note  Patient Details Name: ELRICK BENHAM MRN: [id number] DOB: [birth year]   Cancelled treatment:       Reason Eval/Treat Not Completed: Medical issues which prohibited therapy. Reviewed chart. Noted new tracheostomy and plans to re-attempt trach collar. Please order a PMSV evaluation when fully weaned off vent. SLP will f/u pending order.   Gabriel Rainwater MA, CCC-SLP [phone]    Linnie Mcglocklin Meryl 10/17/2012, 8:29 AM

## 2012-10-17 NOTE — Progress Notes (Signed)
SLP reviewed and agree with student findings.   [place], [place] [phone]

## 2012-10-17 NOTE — Progress Notes (Signed)
Subjective: Pt off vent. On trach collar.  Able to communicate verbally with the trach occluded.  Objective: Vital signs in last 24 hours: Temp:  [97.5 F (36.4 C)-98.5 F (36.9 C)] 98.1 F (36.7 C) (02/27 0754) Pulse Rate:  [49-82] 77 (02/27 0754) Resp:  [14-23] 23 (02/27 0754) BP: (136-173)/(53-91) 173/91 mmHg (02/27 0754) SpO2:  [93 %-100 %] 95 % (02/27 0801) FiO2 (%):  [40 %] 40 % (02/27 0802) Weight:  [164.2 kg (361 lb 15.9 oz)] 164.2 kg (361 lb 15.9 oz) (02/27 0430)  Awake and responsive.  Able to communicate verbally. Right facial palsy noted. Incomplete eye closure.  Trach in place. Good air movement.  No results found for this basename: WBC, HGB, HCT, PLT,  in the last 72 hours  Recent Labs  10/16/12 0445 10/17/12 0600  NA 146* 144  K 4.1 4.4  CL 104 101  CO2 34* 34*  GLUCOSE 189* 170*  BUN 43* 43*  CREATININE 0.89 0.75  CALCIUM 9.9 10.1    Medications:  I have reviewed the patient's current medications. Scheduled: . albuterol  2.5 mg Nebulization Q6H  . antiseptic oral rinse  15 mL Mouth Rinse QID  . artificial tears   Right Eye Q8H  . atenolol  50 mg Oral BID  . chlorhexidine  15 mL Mouth Rinse BID  . dexamethasone  4 mg Intravenous Q8H  . feeding supplement  90 mL Per Tube QID  . feeding supplement (PROMOTE)  1,000 mL Per Tube Q24H  . insulin aspart  0-9 Units Subcutaneous Q4H  . ipratropium  0.5 mg Nebulization Q6H  . pantoprazole (PROTONIX) IV  40 mg Intravenous QHS  . senna  1 tablet Oral BID   [id number], HYDROcodone-acetaminophen, labetalol, naLOXone (NARCAN)  injection, ondansetron (ZOFRAN) IV, ondansetron, oxyCODONE, promethazine  Assessment/Plan: POD #6 s/p right AN resection. Postop CT shows good tumor resection and decompression. Right facial palsy noted. Will need lacrilube at night to prevent corneal drying. Trach cuff deflated. Will change to uncuffed trach tomorrow if tolerating TC.  PMV trial.  Pt is otherwise recovering well from  his surgery.    LOS: 6 days   Stephene Alegria,SUI W 10/17/2012, 8:55 AM

## 2012-10-17 NOTE — Progress Notes (Signed)
Patient ID: [name], male   DOB: Jan 25, 1961, 52 y.o.   MRN: [id number] BP 154/80  Pulse 62  Temp(Src) 98.2 F (36.8 C) (Oral)  Resp 18  Ht 5\' 11"  (1.803 m)  Wt 164.2 kg (361 lb 15.9 oz)  BMI 50.51 kg/m2  SpO2 91% Alert and oriented  Still on trach collar Moving all extremities well Wound is clean, dry, and without signs of infection Stable right 7th pasly

## 2012-10-18 DIAGNOSIS — Z930 Tracheostomy status: Secondary | ICD-10-CM

## 2012-10-18 DIAGNOSIS — R279 Unspecified lack of coordination: Secondary | ICD-10-CM

## 2012-10-18 LAB — BASIC METABOLIC PANEL
BUN: 43 mg/dL — ABNORMAL HIGH (ref 6–23)
Chloride: 105 mEq/L (ref 96–112)
GFR calc Af Amer: >90 mL/min (ref >90)
Glucose, Bld: 210 mg/dL — ABNORMAL HIGH (ref 70–99)
Potassium: 4.2 mEq/L (ref 3.5–5.1)

## 2012-10-18 LAB — GLUCOSE, CAPILLARY: Glucose-Capillary: 159 mg/dL — ABNORMAL HIGH (ref 70–99)

## 2012-10-18 MED ORDER — DEXAMETHASONE SODIUM PHOSPHATE 4 MG/ML IJ SOLN
4.0000 mg | Freq: Two times a day (BID) | INTRAMUSCULAR | Status: DC
  Administered 2012-10-18 – 2012-10-21 (×6): 4 mg via INTRAVENOUS
  Filled 2012-10-18 (×7): qty 1

## 2012-10-18 NOTE — Progress Notes (Signed)
Name: [name] MRN: [id number] DOB: [birth year]    LOS: 7  PULMONARY / CRITICAL CARE MEDICINE   Consult requested by: Dr. Christella Noa Reason: Mechanical ventilation management. Brief Hx:   52 yo MO WM s/p trach and R acoustic neuroma resection. PCCM consulted for postop vent care.  Current Status: Did well on ATC, secretions are decreasing, good cough oob to chair  Vital Signs: Temp:  [98 F (36.7 C)-98.9 F (37.2 C)] 98.4 F (36.9 C) (02/28 0731) Pulse Rate:  [61-71] 71 (02/28 0735) Resp:  [16-26] 17 (02/28 0735) BP: (127-175)/(64-89) 148/87 mmHg (02/28 0735) SpO2:  [91 %-100 %] 95 % (02/28 0928) FiO2 (%):  [40 %-60 %] 40 % (02/28 0928) Weight:  [165 kg (363 lb 12.1 oz)] 165 kg (363 lb 12.1 oz) (02/28 0500)  Physical Examination: General: on  trach collar, obese Neuro:alert, rt facial palsy HEENT:  PERRL, pink conjunctivae, moist membranes Neck:  Supple, no JVD  , trach Cardiovascular:  RRR, no M/R/G Lungs:  Reduced overall Abdomen:  Soft, nontender, nondistended, bowel sounds present Musculoskeletal:  Moves all extremities, no pedal edema Skin:  No rash   Results for orders placed during the hospital encounter of 10/11/12 (from the past 24 hour(s))  GLUCOSE, CAPILLARY     Status: Abnormal   Collection Time    10/17/12  1:21 PM      Result Value Range   Glucose-Capillary 159 (*) 70 - 99 mg/dL   Comment 1 Notify RN    GLUCOSE, CAPILLARY     Status: Abnormal   Collection Time    10/17/12  5:00 PM      Result Value Range   Glucose-Capillary 140 (*) 70 - 99 mg/dL   Comment 1 Notify RN    GLUCOSE, CAPILLARY     Status: Abnormal   Collection Time    10/17/12  8:37 PM      Result Value Range   Glucose-Capillary 142 (*) 70 - 99 mg/dL  GLUCOSE, CAPILLARY     Status: Abnormal   Collection Time    10/18/12 12:44 AM      Result Value Range   Glucose-Capillary 159 (*) 70 - 99 mg/dL  GLUCOSE, CAPILLARY     Status: Abnormal   Collection Time    10/18/12  3:54 AM       Result Value Range   Glucose-Capillary 189 (*) 70 - 99 mg/dL  BASIC METABOLIC PANEL     Status: Abnormal   Collection Time    10/18/12  5:35 AM      Result Value Range   Sodium 145  135 - 145 mEq/L   Potassium 4.2  3.5 - 5.1 mEq/L   Chloride 105  96 - 112 mEq/L   CO2 35 (*) 19 - 32 mEq/L   Glucose, Bld 210 (*) 70 - 99 mg/dL   BUN 43 (*) 6 - 23 mg/dL   Creatinine, Ser 0.70  0.50 - 1.35 mg/dL   Calcium 9.9  8.4 - 10.5 mg/dL   GFR calc non Af Amer >90  >90 mL/min   GFR calc Af Amer >90  >90 mL/min  GLUCOSE, CAPILLARY     Status: Abnormal   Collection Time    10/18/12  8:04 AM      Result Value Range   Glucose-Capillary 185 (*) 70 - 99 mg/dL   Comment 1 Notify RN      Ventilator Settings: Vent Mode:  [-]  FiO2 (%):  [40 %-60 %] 40 %  ETT:  Tracheostomy (10/11/12) ECG:  NSR Lines: Peripheral lines  ASSESSMENT AND PLAN 52 years old male with PMH relevant for morbid obesity, CAD post CABG, CHF, HTN, DM. Presents electively for acustic neuroma resection. Consult called to assist with management of mechanical ventilation. The patient is hemodynamically stable.now on ATC  PULMONARY A:   1) Post op of acoustic neuroma resection 2) Tracheostomy 3) r/o fluid edema / atx  4) pulm edema componeent P:   -  trach collar  goal 24 h, on since 2/26   CV: A: Mild HTN improved P: Lasix prn Atenolol If needed add hydralazine   Renal: A: Hypernatremia, pulm edema - resolved P: monitor chem in am Lasix  prn dc d5w   ID: A: moderate secretions, no acute issues P: pcxr slight improved  GI: A: r/o dysphagia P: NGT, TF Swallow eval -FEES  HEME: A: dvt prevention P: scd  ENDO slight hyperglycemia Add ssi  Neuro: A: acoustic neuroma, pain management P: per neurosurgery, pt if ok by NS  PLAN: PM valve OT REhab How long does he need decadron??  GLOBAL - LTAC vs CIR  Penne Rosenstock V.  [phone]

## 2012-10-18 NOTE — Progress Notes (Signed)
Rehab Admissions Coordinator Note:  Patient was screened by Cleatrice Burke for appropriateness for an Inpatient Acute Rehab Consult.  At this time, we are recommending Inpatient Rehab consult as well as OT consult which will be needed for [facility] authorization. I will contact RN CM for assistance in obtaining order.   Cleatrice Burke 10/18/2012, 9:46 AM  I can be reached at [phone].

## 2012-10-18 NOTE — Progress Notes (Signed)
Subjective: Pt off vent. On trach collar. Able to communicate verbally with the trach occluded. Copious amount of secretion noted by nursing staff.  Objective: Vital signs in last 24 hours: Temp:  [98.4 F (36.9 C)-98.9 F (37.2 C)] 98.5 F (36.9 C) (02/28 1136) Pulse Rate:  [62-87] 84 (02/28 1525) Resp:  [17-26] 21 (02/28 1525) BP: (127-163)/(64-88) 157/88 mmHg (02/28 1144) SpO2:  [91 %-97 %] 97 % (02/28 1525) FiO2 (%):  [40 %-60 %] 40 % (02/28 1525) Weight:  [165 kg (363 lb 12.1 oz)] 165 kg (363 lb 12.1 oz) (02/28 0500)  Awake and responsive.  Able to communicate verbally and via gestures.  Right facial palsy noted. Incomplete eye closure.  Trach in place. Good air movement. A large amount of secretion noted.  No results found for this basename: WBC, HGB, HCT, PLT,  in the last 72 hours  Recent Labs  10/17/12 0600 10/18/12 0535  NA 144 145  K 4.4 4.2  CL 101 105  CO2 34* 35*  GLUCOSE 170* 210*  BUN 43* 43*  CREATININE 0.75 0.70  CALCIUM 10.1 9.9    Medications:  I have reviewed the patient's current medications. Scheduled: . albuterol  2.5 mg Nebulization Q6H  . antiseptic oral rinse  15 mL Mouth Rinse QID  . artificial tears   Right Eye Q8H  . atenolol  50 mg Oral BID  . chlorhexidine  15 mL Mouth Rinse BID  . dexamethasone  4 mg Intravenous Q8H  . feeding supplement  90 mL Per Tube QID  . feeding supplement (PROMOTE)  1,000 mL Per Tube Q24H  . insulin aspart  0-9 Units Subcutaneous Q4H  . ipratropium  0.5 mg Nebulization Q6H  . pantoprazole (PROTONIX) IV  40 mg Intravenous QHS  . senna  1 tablet Oral BID   [id number], HYDROcodone-acetaminophen, labetalol, naLOXone (NARCAN)  injection, ondansetron (ZOFRAN) IV, ondansetron, oxyCODONE, promethazine  Assessment/Plan: POD #7 s/p right AN resection. Postop CT shows good tumor resection and decompression. Right facial palsy noted. Will need lacrilube at night to prevent corneal drying. Trach downsized to #6  uncuffed. If his secretion improves, will remove the trach.    LOS: 7 days   Yun Gutierrez,SUI W 10/18/2012, 5:12 PM

## 2012-10-18 NOTE — Evaluation (Signed)
Physical Therapy Evaluation Patient Details Name: [name] MRN: [id number] DOB: [birth year] Today's Date: 10/18/2012 Time: [id number] PT Time Calculation (min): 29 min  PT Assessment / Plan / Recommendation Clinical Impression  Pt admitted s/p acoustic neuroma resection with tracheostomy. Pt communicating via letter board and mouthing words. Pt very positive and eager to walk and return to work. Pt excellent CIR candidate and discussed with Dr.ALva. Will follow acutely to maximize mobility, gait, transfers, function and safety to decrease burden of care and return pt to PLOF.     PT Assessment  Patient needs continued PT services    Follow Up Recommendations  CIR    Does the patient have the potential to tolerate intense rehabilitation      Barriers to Discharge None      Equipment Recommendations  Other (comment) (TBD based on progress)    Recommendations for Other Services Rehab consult;OT consult   Frequency Min 3X/week    Precautions / Restrictions Precautions Precautions: Fall Precaution Comments: trach, NGT   Pertinent Vitals/Pain No pain      Mobility  Bed Mobility Bed Mobility: Rolling Left;Left Sidelying to Sit Rolling Left: 4: Min assist Left Sidelying to Sit: 4: Min assist;With rails;HOB elevated Details for Bed Mobility Assistance: HOB 25degrees with cueing to reach for rail with RUE and assist to complete with initial pulling on PT prior to being able to reach rail. Assist to bring legs to EOB and fully elevate trunk Transfers Transfers: Sit to Stand;Stand to Sit;Stand Pivot Transfers Sit to Stand: 1: +2 Total assist;From bed Sit to Stand: Patient Percentage: 70% Stand to Sit: 1: +2 Total assist;To bed;To chair/3-in-1 Stand to Sit: Patient Percentage: 70% Stand Pivot Transfers: 1: +2 Total assist Stand Pivot Transfers: Patient Percentage: 70% Details for Transfer Assistance: bil HHA to stand from bed x 2 with cueing for safety and stability due to  report of dizziness with stable BP and decreased control of descent with pivot to chair Ambulation/Gait Ambulation/Gait Assistance: Not tested (comment)    Exercises     PT Diagnosis: Difficulty walking  PT Problem List: Decreased activity tolerance;Decreased mobility;Cardiopulmonary status limiting activity PT Treatment Interventions: DME instruction;Gait training;Functional mobility training;Therapeutic exercise;Therapeutic activities;Patient/family education   PT Goals Acute Rehab PT Goals PT Goal Formulation: With patient Time For Goal Achievement: 11/01/12 Potential to Achieve Goals: Good Pt will go Supine/Side to Sit: with supervision PT Goal: Supine/Side to Sit - Progress: Goal set today Pt will go Sit to Supine/Side: with min assist;with HOB 0 degrees PT Goal: Sit to Supine/Side - Progress: Goal set today Pt will go Sit to Stand: with min assist PT Goal: Sit to Stand - Progress: Goal set today Pt will go Stand to Sit: with min assist PT Goal: Stand to Sit - Progress: Goal set today Pt will Transfer Bed to Chair/Chair to Bed: with min assist PT Transfer Goal: Bed to Chair/Chair to Bed - Progress: Goal set today Pt will Ambulate: 16 - 50 feet;with min assist;with least restrictive assistive device (with sats >90%) PT Goal: Ambulate - Progress: Goal set today  Visit Information  Last PT Received On: 10/18/12 Assistance Needed: +2    Subjective Data  Subjective: ice on my feet Patient Stated Goal: return to work   Prior [place] Lives With: Spouse;Other (Comment) (mom) Available Help at Discharge: Family Type of Home: Mobile home Home Access: Ramped entrance Home Layout: One level Home Adaptive Equipment: Walker - rolling;Wheelchair - manual;Shower chair with back Prior Function Level  of Independence: Independent with assistive device(s);Needs assistance Needs Assistance: Light Housekeeping;Meal Prep Meal Prep: Maximal Light Housekeeping:  Maximal Able to Take Stairs?: No Driving: No Comments: was employed prior to development of acoustic neuroma. Pt reports he was doing his own ADLs and getting into [employer] on his own. Wife performed cooking and housework Communication Communication: Tracheostomy;Other (comment) (pt mouthing words and using letter board)    Cognition  Cognition Overall Cognitive Status: Appears within functional limits for tasks assessed/performed Arousal/Alertness: Awake/alert Orientation Level: Appears intact for tasks assessed Behavior During Session: [facility] for tasks performed    Extremity/Trunk Assessment Right Upper Extremity Assessment RUE ROM/Strength/Tone: [facility] for tasks assessed Left Upper Extremity Assessment LUE ROM/Strength/Tone: [facility] for tasks assessed Right Lower Extremity Assessment RLE ROM/Strength/Tone: Within functional levels (4/5 hip flexion, knee extension and flexion) RLE Sensation: WFL - Light Touch Left Lower Extremity Assessment LLE ROM/Strength/Tone: Within functional levels (4/5 hip flexion, knee extension and flexion) LLE Sensation: WFL - Light Touch Trunk Assessment Trunk Assessment: Normal   Balance Static Sitting Balance Static Sitting - Balance Support: Feet supported;No upper extremity supported Static Sitting - Level of Assistance: 6: Modified independent (Device/Increase time) Static Sitting - Comment/# of Minutes: 3  End of Session PT - End of Session Equipment Utilized During Treatment: Oxygen Activity Tolerance: Patient tolerated treatment well Patient left: in chair;with call bell/phone within reach Nurse Communication: Mobility status  GP     Melford Aase 10/18/2012, 9:38 AM Elwyn Reach, [place]

## 2012-10-18 NOTE — Progress Notes (Signed)
Patient ID: [name], male   DOB: 1960/10/16, 52 y.o.   MRN: [id number] BP 157/88  Pulse 98  Temp(Src) 98.5 F (36.9 C) (Oral)  Resp 20  Ht 5\' 11"  (1.803 m)  Wt 165 kg (363 lb 12.1 oz)  BMI 50.76 kg/m2  SpO2 93% Alert,following all commands Wound looks good, though one spot has some purulent looking material He is afebrile. Will monitor closely.  Making progress with tracheostomy. Right 7th palsy. stable

## 2012-10-18 NOTE — Evaluation (Signed)
Passy-Muir Speaking Valve - Evaluation Patient Details  Name: [name] MRN: [id number] Date of Birth: 01-20-1961  Today's Date: 10/18/2012 Time: 0830-0910 SLP Time Calculation (min): 40 min  Past Medical History:  Past Medical History  Diagnosis Date  . CAD (coronary artery disease)     Cath 09, 99% LAD  . Cardiomyopathy, ischemic     Ischemic  . Obesity   . Fibromyalgia   . Shortness of breath   . CHF (congestive heart failure)   . Hypertension     dr Percival Spanish  . Cyst near coccyx   . Diabetes mellitus without complication     borderline  . Cyst near tailbone    Past Surgical History:  Past Surgical History  Procedure Laterality Date  . Coronary artery bypass graft      LIMA to LAD 2009  . Umbilical hernia repair    . Anal fistulectomy    . Retrosigmoid craniectomy for tumor resection Right 10/11/2012    Procedure: RETROSIGMOID CRANIECTOMY FOR TUMOR RESECTION;  Surgeon: [name], MD;  Location: [place] [facility];  Service: Neurosurgery;  Laterality: Right;  Craniotomy for acoustic neuroma  . Tracheostomy tube placement N/A 10/11/2012    Procedure: TRACHEOSTOMY;  Surgeon: Ascencion Dike, MD;  Location: [facility];  Service: ENT;  Laterality: N/A;  . Acoustic neuroma resection N/A 10/11/2012    Procedure: ACOUSTIC NEUROMA RESECTION;  Surgeon: Ascencion Dike, MD;  Location: [facility];  Service: ENT;  Laterality: N/A;   HPI:  52 yr old admitted with 3 months history of decreased coordination and balance.  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  Pt. tolerating ATC since 2/27. PMH:  HTN, MI, CABG   Assessment / Plan / Recommendation Clinical Impression  PMSV assessment completed with pleasant pt. alert and upright in recliner.  Pt. has # 8 cuffed and deflated Shiley with moderate-copious thick blood tinged secretions.  Valve donned for 25-30 minutes with HR 71-80, Sp02  92-93%, RR 17.  Valve intermittently removed throughout session which did not indicate C02 trapping.  Vocal quality is decreased with a hoarseness/whisper and reduced vocal intensity, only able to achieve true vocal cord adduction and adequate respiratory support inconsistently.  Pt. would likely achieve increased vocal qualtiy if trach downsized to a #6 and cuffless if able.  Recommend pt. wear valve with full supervision with RN and therapies.  Will recommend objective swallow assessment with FEES once pt. able to efficiently wear PMV for 45 minute intervals (feel he will achieve this goal quickly).  ST will continue to follow for use with PMSV and FEES when appropriate.       SLP Assessment  Patient needs continued Speech Lanaguage Pathology Services    Follow Up Recommendations   (to be determined)    Frequency and Duration min 3x week  2 weeks   Pertinent Vitals/Pain none    SLP Goals Potential to Achieve Goals: Good SLP Goal #1: Pt. wil tolerate PMSV with vital signs stable for 45 minutes without indications of distress with mod verbal cues. SLP Goal #2: Pt. will coordinate respiration and phonation implementing strategies to achieve increased vocal intensity and increased mean length of utterances.   PMSV Trial  PMSV was placed for: total 25-30 min Able to redirect subglottic air through upper airway: Yes Able to Attain Phonation: Yes (only intermittently) Voice Quality: Low vocal intensity;Hoarse (dysphonic) Able to Expectorate Secretions:  Yes Level of Secretion Expectoration with PMSV: Tracheal Breath Support for Phonation: Moderately decreased Intelligibility: Intelligibility reduced Word: 25-49% accurate Phrase: 0-24% accurate Respirations During Trial: 17 SpO2 During Trial: 92 % Pulse During Trial: 71   Tracheostomy Tube  Additional Tracheostomy Tube Assessment Level of Secretion Expectoration: Tracheal    Vent Dependency  FiO2 (%): 40 %    Cuff Deflation Trial  Tolerated Cuff Deflation:  (cuff deflated upon SLP arrival) Behavior: Alert;Cooperative;Good eye contact;Smiling   Orbie Pyo [place].Ed [employer] [phone]  10/18/2012

## 2012-10-18 NOTE — Consult Note (Signed)
Physical Medicine and Rehabilitation Consult Reason for Consult: Acoustic neuroma with dizziness, right hearing loss, weakness bilateral hands.  Referring Physician:  Dr. Elsworth Soho.   HPI: [name] is a 52 y.o. male with history of CAD, ICM, morbid obesity, who has had right sided hearing loss for the past year and two month history of difficulty using hands, hoarseness, urinary frequency as well as dizziness with balance problems requiring use of wheelchair. 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 He was admitted on [id number] for acoustic neroma resection by Dr. Christella Noa and tracheostomy by Dr. Benjamine Mola. Post op required slow vent wean. Therapy evaluations initiated today with trials of PMSV. FEES once patient able to tolerate PMV for 45 mins.  MD,  Therapy team recommending CIR Patient is awaiting swallow evaluation. N.p.o. Off the vent completely today. Patient communicates with communication board. We discussed rehabilitation. He spelled out the sooner the better  Review of Systems  Unable to perform ROS: language   Past Medical History  Diagnosis Date  . CAD (coronary artery disease)     Cath 09, 99% LAD  . Cardiomyopathy, ischemic     Ischemic  . Obesity   . Fibromyalgia   . Shortness of breath   . CHF (congestive heart failure)   . Hypertension     dr Percival Spanish  . Cyst near coccyx   . Diabetes mellitus without complication     borderline  . Cyst near tailbone    Past Surgical History  Procedure Laterality Date  . Coronary artery bypass graft      LIMA to LAD 2009  . Umbilical hernia repair    . Anal fistulectomy    . Retrosigmoid craniectomy for tumor resection Right 10/11/2012    Procedure: RETROSIGMOID CRANIECTOMY FOR TUMOR RESECTION;  Surgeon: [name], MD;  Location: [facility];  Service: Neurosurgery;  Laterality: Right;  Craniotomy for acoustic neuroma  . Tracheostomy tube placement N/A 10/11/2012    Procedure: TRACHEOSTOMY;   Surgeon: Ascencion Dike, MD;  Location: [facility];  Service: ENT;  Laterality: N/A;  . Acoustic neuroma resection N/A 10/11/2012    Procedure: ACOUSTIC NEUROMA RESECTION;  Surgeon: Ascencion Dike, MD;  Location: [facility];  Service: ENT;  Laterality: N/A;   Family History  Problem Relation Age of Onset  . CAD Father    Social History:  reports that he has been smoking Cigarettes.  He has a 10 pack-year smoking history. He uses smokeless tobacco. He reports that he does not drink alcohol or use illicit drugs.   Allergies  Allergen Reactions  . Morphine And Related Anaphylaxis    "makes me stop breathing"  . Other     Steroids makes hearts race   Medications Prior to Admission  Medication Sig Dispense Refill  . atenolol (TENORMIN) 25 MG tablet Take 25 mg by mouth 2 (two) times daily.      Marland Kitchen HYDROcodone-acetaminophen (VICODIN) 5-500 MG per tablet Take 1 tablet by mouth every 6 (six) hours as needed. For pain        Home: Home Living Lives With: Spouse;Other (Comment) (mom) Available Help at Discharge: Family Type of Home: Mobile home Home Access: Ramped entrance Home Layout: One level Home Adaptive Equipment: Walker - rolling;Wheelchair - Scientist, research (medical) with back  Functional History: Prior Function Meal Prep: Maximal Light Housekeeping: Maximal Able to Take Stairs?: No Driving: No Comments: was employed prior to development of acoustic neuroma. Pt reports he was  doing his own ADLs and getting into [facility] [employer] on his own. Wife performed cooking and housework Functional Status:  Mobility: Bed Mobility Bed Mobility: Rolling Left;Left Sidelying to Sit Rolling Left: 4: Min assist Left Sidelying to Sit: 4: Min assist;With rails;HOB elevated Transfers Transfers: Sit to Stand;Stand to Sit;Stand Pivot Transfers Sit to Stand: 1: +2 Total assist;From bed Sit to Stand: Patient Percentage: 70% Stand to Sit: 1: +2 Total assist;To bed;To chair/3-in-1 Stand to Sit: Patient Percentage:  70% Stand Pivot Transfers: 1: +2 Total assist Stand Pivot Transfers: Patient Percentage: 70% Ambulation/Gait Ambulation/Gait Assistance: Not tested (comment)    ADL:    Cognition: Cognition Arousal/Alertness: Awake/alert Orientation Level: Oriented X4 Cognition Overall Cognitive Status: Appears within functional limits for tasks assessed/performed Arousal/Alertness: Awake/alert Orientation Level: Appears intact for tasks assessed Behavior During Session: [facility] for tasks performed  Blood pressure 157/88, pulse 79, temperature 98.4 F (36.9 C), temperature source Oral, resp. rate 17, height 5\' 11"  (1.803 m), weight 165 kg (363 lb 12.1 oz), SpO2 95.00%. Physical Exam  Nursing note and vitals reviewed. Constitutional: He appears well-developed and well-nourished.  Morbidly obese focused on getting back in bed and concerns regarding bowel incontinence.   HENT:  Head: Normocephalic and atraumatic.  Multiple dental caries with broken teeth.   Eyes: Pupils are equal, round, and reactive to light.  Neck:  ATC in place.   Cardiovascular: Normal rate and regular rhythm.   Pulmonary/Chest: Effort normal and breath sounds normal.  Abdominal: Soft. Bowel sounds are normal.  Musculoskeletal:  1+ pedal edema. Bilateral feet cold and cyanotic.   Neurological: He is alert.  Unable/unwilling to write to communicate. Able to follow basic commands.   5/5 strength in bilateral deltoid, biceps, triceps, grip, hip flexor, knee extensors, ankle dorsiflexor plantar flexor Sensory cannot assess Ataxia on finger nose to finger testing as well as heel-to-shin testing the right upper and right lower extremity Extraocular muscles difficult to assess secondary to communication issues Heart of hearing Does not attempt any mouthing words Does not want to use PMV Results for orders placed during the hospital encounter of 10/11/12 (from the past 24 hour(s))  GLUCOSE, CAPILLARY     Status: Abnormal    Collection Time    10/17/12  1:21 PM      Result Value Range   Glucose-Capillary 159 (*) 70 - 99 mg/dL   Comment 1 Notify RN    GLUCOSE, CAPILLARY     Status: Abnormal   Collection Time    10/17/12  5:00 PM      Result Value Range   Glucose-Capillary 140 (*) 70 - 99 mg/dL   Comment 1 Notify RN    GLUCOSE, CAPILLARY     Status: Abnormal   Collection Time    10/17/12  8:37 PM      Result Value Range   Glucose-Capillary 142 (*) 70 - 99 mg/dL  GLUCOSE, CAPILLARY     Status: Abnormal   Collection Time    10/18/12 12:44 AM      Result Value Range   Glucose-Capillary 159 (*) 70 - 99 mg/dL  GLUCOSE, CAPILLARY     Status: Abnormal   Collection Time    10/18/12  3:54 AM      Result Value Range   Glucose-Capillary 189 (*) 70 - 99 mg/dL  BASIC METABOLIC PANEL     Status: Abnormal   Collection Time    10/18/12  5:35 AM      Result Value Range   Sodium 145  135 - 145 mEq/L   Potassium 4.2  3.5 - 5.1 mEq/L   Chloride 105  96 - 112 mEq/L   CO2 35 (*) 19 - 32 mEq/L   Glucose, Bld 210 (*) 70 - 99 mg/dL   BUN 43 (*) 6 - 23 mg/dL   Creatinine, Ser 0.70  0.50 - 1.35 mg/dL   Calcium 9.9  8.4 - 10.5 mg/dL   GFR calc non Af Amer >90  >90 mL/min   GFR calc Af Amer >90  >90 mL/min  GLUCOSE, CAPILLARY     Status: Abnormal   Collection Time    10/18/12  8:04 AM      Result Value Range   Glucose-Capillary 185 (*) 70 - 99 mg/dL   Comment 1 Notify RN     No results found.  Assessment/Plan: Diagnosis: Right acoustic neuroma with right hemiataxia right hearing deficit 1. Does the need for close, 24 hr/day medical supervision in concert with the patient's rehab needs make it unreasonable for this patient to be served in a less intensive setting? Yes 2. Co-Morbidities requiring supervision/potential complications: Morbid obesity, status post tracheostomy 3. Due to bladder management, bowel management, safety, skin/wound care, disease management, medication administration, pain management and  patient education, does the patient require 24 hr/day rehab nursing? Potentially 4. Does the patient require coordinated care of a physician, rehab nurse, PT (1-2 hrs/day, 5 days/week), OT (1-2 hrs/day, 5 days/week) and SLP (0.5-1 hrs/day, 5 days/week) to address physical and functional deficits in the context of the above medical diagnosis(es)? Potentially Addressing deficits in the following areas: balance, endurance, locomotion, strength, transferring, bowel/bladder control, bathing, dressing, feeding, grooming, toileting, cognition, speech, language and swallowing 5. Can the patient actively participate in an intensive therapy program of at least 3 hrs of therapy per day at least 5 days per week? Potentially and not ready for more intensive therapy at this point 6. The potential for patient to make measurable gains while on inpatient rehab is good 7. Anticipated functional outcomes upon discharge from inpatient rehab are Min assist mobility with PT, Min assist ADLs with OT, Establish effective communication with SLP. 8. Estimated rehab length of stay to reach the above functional goals is: 3 weeks 9. Does the patient have adequate social supports to accommodate these discharge functional goals? Potentially 10. Anticipated D/C setting: Home 11. Anticipated post D/C treatments: [facility] therapy 12. Overall Rehab/Functional Prognosis: good  RECOMMENDATIONS: This patient's condition is appropriate for continued rehabilitative care in the following setting:  CIR once Patient remains off vent, gets swallow reevaluate and decision for PEG tube made. Patient has agreed to participate in recommended program. Yes Note that insurance prior authorization may be required for reimbursement for recommended care.  Comment:    10/18/2012

## 2012-10-19 LAB — GLUCOSE, CAPILLARY
Glucose-Capillary: 145 mg/dL — ABNORMAL HIGH (ref 70–99)
Glucose-Capillary: 196 mg/dL — ABNORMAL HIGH (ref 70–99)
Glucose-Capillary: 167 mg/dL — ABNORMAL HIGH (ref 70–99)

## 2012-10-19 NOTE — Progress Notes (Signed)
No new issues. Overall stable. Patient denies any complaints.  Afebrile. Vital stable. Wound healing well. Neurologic exam stable.  Continue current management. No new changes.

## 2012-10-19 NOTE — Progress Notes (Signed)
Name: [name] MRN: [id number] DOB: Nov 08, 1960    LOS: 8  PULMONARY / CRITICAL CARE MEDICINE   Consult requested by: Dr. Christella Noa Reason: Mechanical ventilation management.  Brief Hx:   52 yo MO WM s/p trach and R acoustic neuroma resection. PCCM consulted for postop vent care.  Current Status: Did well on ATC, secretions are decreasing, good cough oob to chair  Vital Signs: Temp:  [98 F (36.7 C)-99.2 F (37.3 C)] 98 F (36.7 C) (03/01 1140) Pulse Rate:  [67-98] 77 (03/01 1150) Resp:  [20-24] 24 (03/01 1150) BP: (137-141)/(57-79) 139/66 mmHg (03/01 1150) SpO2:  [90 %-97 %] 91 % (03/01 1150) FiO2 (%):  [40 %-60 %] 60 % (03/01 1150) Weight:  [343 lb 14.7 oz (156 kg)] 343 lb 14.7 oz (156 kg) (03/01 0500)  Physical Examination: General: on  trach collar, obese Neuro:alert, rt facial palsy HEENT:  PERRL, pink conjunctivae, moist membranes Neck:  Supple, no JVD  , trach Cardiovascular:  RRR, no M/R/G Lungs:  Reduced overall Abdomen:  Soft, nontender, nondistended, bowel sounds present Musculoskeletal:  Moves all extremities, no pedal edema Skin:  No rash  ETT:  Tracheostomy (10/11/12) ECG:  NSR    ASSESSMENT AND PLAN 52 years old male with PMH relevant for morbid obesity, CAD post CABG, CHF, HTN, DM. Presented electively for acustic neuroma resection.  The patient is hemodynamically stable.now on ATC  PULMONARY A:   1) Post op of acoustic neuroma resection 2) Tracheostomy 3) r/o fluid edema / atx  4) pulm edema componeent P:   -  trach collar  goal 24 h, on since 2/26   CV: A: Mild HTN improved P: Lasix prn Atenolol If needed add hydralazine   Renal: A: Hypernatremia, pulm edema - resolved P: monitor chem in am Lasix  prn    ID: A: moderate secretions, no acute issues P: no rx  GI: A: r/o dysphagia P: NGT, TF Swallow eval -FEES  HEME: A: dvt prevention P: scd  ENDO slight hyperglycemia Add ssi  Neuro: A: acoustic neuroma, pain  management P: per neurosurgery, pt if ok by NS  PLAN: PM valve OT REhab How long does he need decadron??  GLOBAL - LTAC vs CIR   Christinia Gully, MD Pulmonary and [place] [phone] After 5:30 PM or weekends, call [phone]

## 2012-10-19 NOTE — Progress Notes (Signed)
Subjective: Pt on trach collar.  Continue to have significant amount of drainage.  Still has occasional desat.  Objective: Vital signs in last 24 hours: Temp:  [98 F (36.7 C)-99.2 F (37.3 C)] 98.4 F (36.9 C) (03/01 1531) Pulse Rate:  [67-98] 75 (03/01 1537) Resp:  [15-24] 19 (03/01 1537) BP: (137-141)/(57-79) 140/69 mmHg (03/01 1537) SpO2:  [90 %-97 %] 94 % (03/01 1537) FiO2 (%):  [40 %-60 %] 60 % (03/01 1537) Weight:  [156 kg (343 lb 14.7 oz)] 156 kg (343 lb 14.7 oz) (03/01 0500)  Awake and responsive.  Able to communicate verbally and via gestures.  Right facial palsy noted. Incomplete eye closure.  Trach in place. Good air movement. A large amount of secretion noted.  No results found for this basename: WBC, HGB, HCT, PLT,  in the last 72 hours  Recent Labs  10/17/12 0600 10/18/12 0535  NA 144 145  K 4.4 4.2  CL 101 105  CO2 34* 35*  GLUCOSE 170* 210*  BUN 43* 43*  CREATININE 0.75 0.70  CALCIUM 10.1 9.9    Medications:  I have reviewed the patient's current medications. Scheduled: . albuterol  2.5 mg Nebulization Q6H  . antiseptic oral rinse  15 mL Mouth Rinse QID  . artificial tears   Right Eye Q8H  . atenolol  50 mg Oral BID  . chlorhexidine  15 mL Mouth Rinse BID  . dexamethasone  4 mg Intravenous Q12H  . feeding supplement  90 mL Per Tube QID  . feeding supplement (PROMOTE)  1,000 mL Per Tube Q24H  . insulin aspart  0-9 Units Subcutaneous Q4H  . ipratropium  0.5 mg Nebulization Q6H  . pantoprazole (PROTONIX) IV  40 mg Intravenous QHS  . senna  1 tablet Oral BID   [id number], HYDROcodone-acetaminophen, labetalol, naLOXone (NARCAN)  injection, ondansetron (ZOFRAN) IV, ondansetron, oxyCODONE, promethazine  Assessment/Plan: POD #8 s/p right AN resection. Postop CT shows good tumor resection and decompression. Right facial palsy noted. Will need lacrilube at night to prevent corneal drying. Trach downsized to #6 uncuffed yesterday. If his secretion  improves, will remove the trach.    LOS: 8 days   Kjersti Dittmer,SUI W 10/19/2012, 3:56 PM

## 2012-10-20 ENCOUNTER — Inpatient Hospital Stay (HOSPITAL_COMMUNITY): Payer: BC Managed Care – PPO

## 2012-10-20 LAB — GLUCOSE, CAPILLARY
Glucose-Capillary: 148 mg/dL — ABNORMAL HIGH (ref 70–99)
Glucose-Capillary: 191 mg/dL — ABNORMAL HIGH (ref 70–99)
Glucose-Capillary: 166 mg/dL — ABNORMAL HIGH (ref 70–99)

## 2012-10-20 NOTE — Progress Notes (Signed)
Overall stable. No new issues. Patient denies pain.  Afebrile. Vital stable. Awake and aware. He medicates easily with gestures and a word sheet. Right facial weakness unchanged. Wound clean and dry.  Overall stable. Continue current efforts.

## 2012-10-20 NOTE — Progress Notes (Signed)
NG tube placement   Verified with CXR/KUB

## 2012-10-20 NOTE — Progress Notes (Signed)
Name: [name] MRN: [id number] DOB: 1961-07-30    LOS: 9  PULMONARY / CRITICAL CARE MEDICINE   Consult requested by: Dr. Christella Noa Reason: Mechanical ventilation management.  Brief Hx:   52 yo MO WM s/p trach and R acoustic neuroma resection. PCCM consulted for postop vent care.  Current Status: Doing well on ATC, secretions are decreasing, good cough oob to chair  Vital Signs: Temp:  [98 F (36.7 C)-98.4 F (36.9 C)] 98 F (36.7 C) (03/02 1214) Pulse Rate:  [51-82] 57 (03/02 1214) Resp:  [15-21] 18 (03/02 1214) BP: (122-156)/(61-92) 156/79 mmHg (03/02 1214) SpO2:  [93 %-97 %] 95 % (03/02 1314) FiO2 (%):  [60 %] 60 % (03/02 1314) Weight:  [351 lb (159.213 kg)] 351 lb (159.213 kg) (03/02 0340)  Physical Examination: General: on  trach collar, obese Neuro:alert, rt facial palsy HEENT:  PERRL, pink conjunctivae, moist membranes Neck:  Supple, no JVD  , trach Cardiovascular:  RRR, no M/R/G Lungs:  Reduced overall Abdomen:  Soft, nontender, nondistended, bowel sounds present Musculoskeletal:  Moves all extremities, no pedal edema Skin:  No rash  ETT:  Tracheostomy (10/11/12) ECG:  NSR    ASSESSMENT AND PLAN 52 years old male with PMH relevant for morbid obesity, CAD post CABG, CHF, HTN, DM. Presented electively for acustic neuroma resection.  The patient is hemodynamically stable.now on ATC  PULMONARY A:   1) Post op of acoustic neuroma resection 2) Tracheostomy 3) r/o fluid edema / atx  4) pulm edema componeent P:   -  trach collar  goal 24 h, on since 2/26   CV: A: Mild HTN improved P: Lasix prn Atenolol If needed add hydralazine   Renal: A: Hypernatremia, pulm edema - resolved P: monitor chem in am Lasix  prn    ID: A: moderate secretions, no acute issues P: no rx  GI: A: r/o dysphagia P: NGT, TF Swallow eval -FEES  HEME: A: dvt prevention P: scd  ENDO slight hyperglycemia Add ssi  Neuro: A: acoustic neuroma, pain  management P: per neurosurgery, pt if ok by NS  PLAN: PM valve OT REhab How long does he need decadron??  GLOBAL - LTAC vs CIR   Christinia Gully, MD Pulmonary and [place] [phone] After 5:30 PM or weekends, call [phone]

## 2012-10-21 LAB — GLUCOSE, CAPILLARY
Glucose-Capillary: 173 mg/dL — ABNORMAL HIGH (ref 70–99)
Glucose-Capillary: 218 mg/dL — ABNORMAL HIGH (ref 70–99)
Glucose-Capillary: 181 mg/dL — ABNORMAL HIGH (ref 70–99)
Glucose-Capillary: 121 mg/dL — ABNORMAL HIGH (ref 70–99)

## 2012-10-21 MED ORDER — INSULIN ASPART 100 UNIT/ML ~~LOC~~ SOLN
0.0000 [IU] | Freq: Three times a day (TID) | SUBCUTANEOUS | Status: DC
  Administered 2012-10-21: 3 [IU] via SUBCUTANEOUS
  Administered 2012-10-21: 5 [IU] via SUBCUTANEOUS
  Administered 2012-10-22: 3 [IU] via SUBCUTANEOUS
  Administered 2012-10-22 (×2): 2 [IU] via SUBCUTANEOUS

## 2012-10-21 MED ORDER — PANTOPRAZOLE SODIUM 40 MG PO PACK
40.0000 mg | PACK | Freq: Every day | ORAL | Status: DC
  Filled 2012-10-21: qty 20

## 2012-10-21 MED ORDER — FENTANYL CITRATE 0.05 MG/ML IJ SOLN
50.0000 ug | Freq: Once | INTRAMUSCULAR | Status: AC
  Administered 2012-10-21: 50 ug via INTRAVENOUS
  Filled 2012-10-21: qty 2

## 2012-10-21 MED ORDER — FUROSEMIDE 10 MG/ML IJ SOLN
40.0000 mg | Freq: Once | INTRAMUSCULAR | Status: AC
  Administered 2012-10-21: 40 mg via INTRAVENOUS
  Filled 2012-10-21: qty 4

## 2012-10-21 MED ORDER — POTASSIUM CHLORIDE 20 MEQ/15ML (10%) PO LIQD
40.0000 meq | Freq: Once | ORAL | Status: AC
  Administered 2012-10-21: 40 meq via ORAL
  Filled 2012-10-21: qty 30

## 2012-10-21 MED ORDER — INSULIN ASPART 100 UNIT/ML ~~LOC~~ SOLN
3.0000 [IU] | Freq: Three times a day (TID) | SUBCUTANEOUS | Status: DC
  Administered 2012-10-21 – 2012-10-22 (×4): 3 [IU] via SUBCUTANEOUS

## 2012-10-21 MED ORDER — ALBUTEROL SULFATE (5 MG/ML) 0.5% IN NEBU: 2.5000 mg | INHALATION_SOLUTION | RESPIRATORY_TRACT | Status: DC | PRN

## 2012-10-21 MED ORDER — INSULIN ASPART 100 UNIT/ML ~~LOC~~ SOLN: 0.0000 [IU] | Freq: Every day | SUBCUTANEOUS | Status: DC

## 2012-10-21 MED ORDER — POTASSIUM CHLORIDE 10 MEQ/100ML IV SOLN: 10.0000 meq | INTRAVENOUS | Status: DC

## 2012-10-21 NOTE — Progress Notes (Signed)
Subjective: Doing well on trach collar all weekend. Trach secretion has decreased.  Objective: Vital signs in last 24 hours: Temp:  [97.5 F (36.4 C)-98 F (36.7 C)] 97.6 F (36.4 C) (03/03 1100) Pulse Rate:  [51-102] 84 (03/03 1100) Resp:  [18-23] 23 (03/03 1100) BP: (133-168)/(66-108) 133/108 mmHg (03/03 1100) SpO2:  [91 %-100 %] 95 % (03/03 1100) FiO2 (%):  [40 %-60 %] 40 % (03/03 0836) Weight:  [149.7 kg (330 lb 0.5 oz)] 149.7 kg (330 lb 0.5 oz) (03/03 0600)  Awake and responsive.  Able to communicate verbally and via gestures.  Right facial palsy noted. Incomplete eye closure.  Trach in place. Good air movement.  No results found for this basename: WBC, HGB, HCT, PLT,  in the last 72 hours No results found for this basename: NA, K, CL, CO2, GLUCOSE, BUN, CREATININE, CALCIUM,  in the last 72 hours  Medications:  I have reviewed the patient's current medications. Scheduled: . albuterol  2.5 mg Nebulization Q6H  . antiseptic oral rinse  15 mL Mouth Rinse QID  . artificial tears   Right Eye Q8H  . atenolol  50 mg Oral BID  . chlorhexidine  15 mL Mouth Rinse BID  . dexamethasone  4 mg Intravenous Q12H  . feeding supplement  90 mL Per Tube QID  . feeding supplement (PROMOTE)  1,000 mL Per Tube Q24H  . insulin aspart  0-9 Units Subcutaneous Q4H  . ipratropium  0.5 mg Nebulization Q6H  . pantoprazole sodium  40 mg Per Tube QHS  . potassium chloride  40 mEq Oral Once  . senna  1 tablet Oral BID   [id number], HYDROcodone-acetaminophen, labetalol, naLOXone (NARCAN)  injection, ondansetron (ZOFRAN) IV, ondansetron, oxyCODONE, promethazine  Assessment/Plan: POD #10 s/p right AN resection. Postop CT shows good tumor resection and decompression. Right facial palsy noted. Will need lacrilube at night to prevent corneal drying. Trach removed today. SLP to evaluate po intake.  Inpatient rehab.    LOS: 10 days   TEOH,SUI W 10/21/2012, 11:32 AM

## 2012-10-21 NOTE — Progress Notes (Signed)
Name: [name] MRN: [id number] DOB: December 02, 1960    LOS: 58  PULMONARY / CRITICAL CARE MEDICINE   Consult requested by: Dr. Christella Noa Reason: Mechanical ventilation management.  Brief Hx:   52 yo MO WM s/p trach and R acoustic neuroma resection. PCCM consulted for postop vent care.  Current Status: Doing well, trach out. Working w/ SLP re: swallow   Vital Signs: Temp:  [97.5 F (36.4 C)-98 F (36.7 C)] 97.5 F (36.4 C) (03/03 0751) Pulse Rate:  [51-80] 60 (03/03 0836) Resp:  [16-19] 19 (03/03 0836) BP: (134-168)/(62-91) 150/86 mmHg (03/03 0836) SpO2:  [91 %-100 %] 93 % (03/03 0836) FiO2 (%):  [40 %-60 %] 40 % (03/03 0836) Weight:  [149.7 kg (330 lb 0.5 oz)] 149.7 kg (330 lb 0.5 oz) (03/03 0600)  Physical Examination: General: no acute distress  Neuro:alert, rt facial palsy HEENT:  PERRL, pink conjunctivae, moist membranes, trach out, occlusive dressing in place.  Neck:  Supple, no JVD  , trach Cardiovascular:  RRR, no M/R/G Lungs:  Scattered rhonchi  Abdomen:  Soft, nontender, nondistended, bowel sounds present Musculoskeletal:  Moves all extremities, no pedal edema Skin:  No rash  ETT:  Tracheostomy (10/11/12) ECG:  NSR    ASSESSMENT AND PLAN 52 years old male with PMH relevant for morbid obesity, CAD post CABG, CHF, HTN, DM. Presented electively for acustic neuroma resection.  The patient is hemodynamically stable.now on ATC  PULMONARY  Post op of acoustic neuroma resection Tracheostomy removed 3/3 by ENT.  P:   -occlusive dressing -pulm toilet-->have discussed tech for manually occluding trach site as stoma heals to assist w/ phonation and cough mechanics.   Mild HTN improved P: Lasix prn Atenolol If needed add hydralazine    r/o dysphagia P: NGT, TF Swallow eval -FEES  hyperglycemia CBG (last 3)   Recent Labs  10/20/12 1601 10/20/12 1942 10/21/12 0359  GLUCAP 188* 166* 172*  Add ssi  acoustic neuroma, pain management P: per  neurosurgery, pt if ok by NS PLAN:  PM valve OT REhab How long does he need decadron?? - Per NS  GLOBAL - LTAC vs CIR   I have changes SSI to ACHS now that he is on diet. His tracheostomy site is being addressed by ENT  PCCM will sign off. Please call if we can be of further asistance  Merton Border, MD ; [facility] service Mobile [phone].  After 5:30 PM or weekends, call [phone]

## 2012-10-21 NOTE — Progress Notes (Signed)
[facility] Progress Note Patient Name: [name] DOB: 1961/04/26 MRN: [id number]  Date of Service  10/21/2012   HPI/Events of Note  NGT migrated early this pm.  Attempts by nurse to replace unsuccessful.  Now requesting IV pain meds as patient cannot take pos   eICU Interventions  Plan: One time dose of 50 mcg fentanyl IV for pain   Intervention Category Minor Interventions: Routine modifications to care plan (e.g. PRN medications for pain, fever)  DETERDING,ELIZABETH 10/21/2012, 1:57 AM

## 2012-10-21 NOTE — Progress Notes (Signed)
Dr. Christella Noa made aware that several attempts were made to advance NGT without success due to coiling and that NGT is out. No further orders at this time

## 2012-10-21 NOTE — Progress Notes (Signed)
Spoke with Dr Christella Noa. Made him aware of KUB results and instructions from the radiologist to advance NGT 6-8cms. Received permission to advance pt's NGT.

## 2012-10-21 NOTE — Progress Notes (Signed)
Physical Therapy Treatment Patient Details Name: [name] MRN: [id number] DOB: 1961/02/13 Today's Date: 10/21/2012 Time: 1000-1029 PT Time Calculation (min): 29 min  PT Assessment / Plan / Recommendation Comments on Treatment Session  Pt admitted s/p acoustic neuroma resection s/p trach removal and clearance for diet today by SLP. Pt eager to have water throughout session but needs cues to slow down and to wipe mouth with uncontrolled right side of mouth. Pt continues to be limited by dizziness and needs max cueing for gaze stabilization with activity to decrease dizziness. Pt encouraged to be OOb daily with nursing and HEP. Will continue to follow.     Follow Up Recommendations        Does the patient have the potential to tolerate intense rehabilitation     Barriers to Discharge        Equipment Recommendations       Recommendations for Other Services    Frequency     Plan Discharge plan remains appropriate;Frequency remains appropriate    Precautions / Restrictions Precautions Precautions: Fall Precaution Comments: dizziness with all mobility   Pertinent Vitals/Pain No pain Dizziness throughout sats 95% on 2L BP 136/83 sitting    Mobility  Bed Mobility Bed Mobility: Supine to Sit Supine to Sit: 5: Supervision;With rails;HOB elevated Details for Bed Mobility Assistance: cueing for sequence with increased time and momentum to elevate trunk from surface and scoot to EOB with HOB 20degrees and use of rail still demonstrating difficulty and fatigue getting to EOB Transfers Transfers: Sit to Stand;Stand to Sit Sit to Stand: 1: +2 Total assist;From bed Sit to Stand: Patient Percentage: 80% Stand to Sit: 1: +2 Total assist;To chair/3-in-1;To bed Stand to Sit: Patient Percentage: 80% Details for Transfer Assistance: cueing for safety, hand placement, gaze stabilization and decreased speed. Pt becomes impulsive with dizziness and sits quickly with max cueing for safety  and control Ambulation/Gait Ambulation/Gait Assistance: 1: +2 Total assist Ambulation/Gait: Patient Percentage: 80% Ambulation Distance (Feet): 4 Feet Assistive device: Rolling walker Ambulation/Gait Assistance Details: cueing for gaze stabilization, decreased speed and use of RW Gait Pattern: Shuffle;Wide base of support Gait velocity: decreased    Exercises General Exercises - Lower Extremity Long Arc Quad: AROM;Both;15 reps;Seated Hip Flexion/Marching: AROM;Both;10 reps;Seated   PT Diagnosis:    PT Problem List:   PT Treatment Interventions:     PT Goals Acute Rehab PT Goals Pt will go Supine/Side to Sit: with supervision;with HOB 0 degrees PT Goal: Supine/Side to Sit - Progress: Updated due to goal met PT Goal: Sit to Stand - Progress: Progressing toward goal PT Goal: Stand to Sit - Progress: Progressing toward goal PT Transfer Goal: Bed to Chair/Chair to Bed - Progress: Progressing toward goal PT Goal: Ambulate - Progress: Progressing toward goal  Visit Information  Last PT Received On: 10/21/12 Assistance Needed: +2    Subjective Data  Subjective: I'm still dizzy   Cognition  Cognition Overall Cognitive Status: Appears within functional limits for tasks assessed/performed Arousal/Alertness: Awake/alert Orientation Level: Appears intact for tasks assessed Behavior During Session: [facility] for tasks performed    Balance  Static Sitting Balance Static Sitting - Balance Support: Feet supported;No upper extremity supported Static Sitting - Level of Assistance: 6: Modified independent (Device/Increase time) Static Sitting - Comment/# of Minutes: 5 Static Standing Balance Static Standing - Balance Support: Bilateral upper extremity supported Static Standing - Level of Assistance: 5: Stand by assistance Static Standing - Comment/# of Minutes: 10, 14, 120 sec respectively with RW and  cueing for gaze stabilization  End of Session PT - End of Session Equipment Utilized  During Treatment: Oxygen Activity Tolerance: Patient tolerated treatment well Patient left: in chair;with call bell/phone within reach;with nursing in room Nurse Communication: Mobility status   GP     Lan[facility] 10/21/2012, 10:44 AM Elwyn Reach, [place]

## 2012-10-21 NOTE — Progress Notes (Signed)
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 Pt presents with below deficits which decrease his independence with ADL and mobility. Pt will benefit from rehab at [facility] to facilitate D/C home with supportive family and friends. Friends have already built ramp for front entrance of house. Pt very motivated to participate with therapy. will follow acutely to facilitate D/C to [facility].    OT Assessment  Patient needs continued OT Services    Follow Up Recommendations  [facility]    Barriers to Discharge None    Equipment Recommendations  3 in 1 bedside comode;Tub/shower bench    Recommendations for Other Services    Frequency  Min 3X/week    Precautions / Restrictions Precautions Precautions: Fall Precaution Comments: dizziness with all mobility   Pertinent Vitals/Pain no apparent distress     ADL  Eating/Feeding: Supervision/safety Where Assessed - Eating/Feeding: Edge of bed Grooming: Supervision/safety;Set up Where Assessed - Grooming: Supported sitting Upper Body Bathing: Supervision/safety;Set up Where Assessed - Upper Body Bathing: Supported sitting Lower Body Bathing: +1 Total assistance Where Assessed - Lower Body Bathing: Supported sit to stand Upper Body Dressing: Moderate assistance Where Assessed - Upper Body Dressing: Supported sitting Lower Body Dressing: +1 Total assistance Where Assessed - Lower Body Dressing: Supported sit to Lobbyist: Moderate assistance Toilet Transfer Method: Stand pivot Toilet Transfer Equipment: Extra wide bedside commode Toileting - Clothing Manipulation and Hygiene: Maximal assistance Where Assessed - Best boy and Hygiene: Lean right  and/or left Equipment Used: Gait belt Transfers/Ambulation Related to ADLs: Used squat pivot transfer. Pt with good strength. Educated pt on keeping focal point during transfers ADL Comments: Limited at this time due to dizziness, body habitus and general wekaness    OT Diagnosis: Generalized weakness;Acute pain;Disturbance of vision  OT Problem List: Decreased strength;Decreased activity tolerance;Impaired balance (sitting and/or standing);Impaired vision/perception;Decreased coordination;Decreased safety awareness;Decreased knowledge of use of DME or AE;Decreased knowledge of precautions;Cardiopulmonary status limiting activity;Obesity;Pain;Increased edema OT Treatment Interventions: Self-care/ADL training;Energy conservation;DME and/or AE instruction;Therapeutic activities;Visual/perceptual remediation/compensation;Patient/family education;Balance training;Neuromuscular education;Therapeutic exercise   OT Goals Acute Rehab OT Goals OT Goal Formulation: With patient Time For Goal Achievement: 11/04/12 Potential to Achieve Goals: Good ADL Goals Pt Will Perform Eating: with supervision;with set-up;Sitting, chair;Other (comment) (with min vc to clear R cheek to decrease pocketing ) ADL Goal: Eating - Progress: Goal set today Pt Will Perform Grooming: with set-up;with supervision;Sitting, chair;Supported ADL Goal: Grooming - Progress: Goal set today Pt Will Perform Upper Body Bathing: with set-up;with supervision;Sitting, chair;Supported ADL Goal: Scientist, clinical (histocompatibility and immunogenetics) - Progress: Goal set today Pt Will Perform Lower Body Bathing: with mod assist;Supported;with adaptive equipment;with cueing (comment type and amount);Sit to stand from chair ADL Goal: Lower Body Bathing - Progress: Goal set today Pt Will Transfer to Toilet: with supervision;with DME;3-in-1;Squat pivot transfer ADL Goal: Toilet Transfer - Progress: Goal set today Pt Will Perform Toileting - Clothing Manipulation: with  supervision;Sitting on 3-in-1 or toilet ADL Goal: Toileting - Clothing Manipulation - Progress: Goal set today Pt Will Perform Toileting - Hygiene: with supervision;Leaning right and/or left on 3-in-1/toilet;with adaptive equipment;with cueing (comment type and amount) ADL Goal: Toileting - Hygiene - Progress: Goal set today Miscellaneous OT Goals Miscellaneous OT Goal #1: Pt will complete gaze stabilization visual exercises with  min vc during functional tasks. OT Goal: Miscellaneous Goal #1 - Progress: Goal set today  Visit Information  Last OT Received On: 10/21/12 Assistance Needed: +2    Subjective Data      Prior Functioning     Home Living Lives With: Spouse;Family Available Help at Discharge: Family Type of Home: Mobile home Home Access: Ramped entrance Home Layout: One level Bathroom Shower/Tub: Tub/shower unit Prior Function Level of Independence: Independent with assistive device(s);Needs assistance Needs Assistance: Bathing;Meal Prep;Light Housekeeping Bath: Minimal Light Housekeeping: Minimal Able to Take Stairs?: No Driving: No Comments: iwfe assisted minimallywith ADL. Used RW for ambulation. w/c prior to admission due to dizziness Communication Communication: Tracheostomy;Other (comment) Dominant Hand: Right         Vision/Perception Vision - History Baseline Vision: No visual deficits Visual History: Other (comment) Patient Visual Report: Diplopia Vision - Assessment Eye Alignment: Impaired (comment) Vision Assessment: Vision tested Ocular Range of Motion: Restricted on the right;Impaired-to be further tested in functional context Tracking/Visual Pursuits: Right eye does not track laterally;Decreased smoothness of horizontal tracking;Impaired - to be further tested in functional context Saccades: Additional eye shifts occurred during testing;Additional head turns occurred during testing Diplopia Assessment: Other (comment) (dyscnojugate gaze. will  further assess) Additional Comments: Pt reports horizontal diplopia which disappears with R eye closed. 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 will further assess (nystagmus R gaze) Perception Perception: Within Functional Limits Praxis Praxis: Intact   Cognition  Cognition Overall Cognitive Status: Appears within functional limits for tasks assessed/performed Arousal/Alertness: Awake/alert Orientation Level: Appears intact for tasks assessed Behavior During Session: [facility] for tasks performed Cognition - Other Comments: Pt frustrated at times with difficulty with communication. Using letter board to communicate well. Pt informed me that he owns [place]    Extremity/Trunk Assessment Right Upper Extremity Assessment RUE ROM/Strength/Tone: [facility] for tasks assessed Left Upper Extremity Assessment LUE ROM/Strength/Tone: WFL for tasks assessed Right Lower Extremity Assessment RLE ROM/Strength/Tone: [facility] for tasks assessed Left Lower Extremity Assessment LLE ROM/Strength/Tone: [facility] for tasks assessed Trunk Assessment Trunk Assessment: Normal     Mobility Bed Mobility Bed Mobility: Supine to Sit Rolling Left: 4: Min assist Supine to Sit: 4: Min assist;HOB elevated Details for Bed Mobility Assistance: cuing for safety Transfers Sit to Stand: 3: Mod assist;With upper extremity assist;From bed Sit to Stand: Patient Percentage: 70% Stand to Sit: 3: Mod assist;To chair/3-in-1 Details for Transfer Assistance: used squat pivot transfer with 1 hand suported at all times due to dizziness     Exercise     Balance Static Sitting Balance Static Sitting - Balance Support: Bilateral upper extremity supported;Feet supported Static Sitting - Level of Assistance: 5: Stand by assistance Static Sitting - Comment/# of Minutes: 8 Static Standing Balance Static Standing - Balance Support: Bilateral upper extremity supported Static Standing - Level of Assistance: 4: Min assist   End of  Session OT - End of Session Equipment Utilized During Treatment: Gait belt Activity Tolerance: Patient tolerated treatment well Patient left: in chair;with call bell/phone within reach Nurse Communication: Mobility status;Precautions  GO     WARD,HILLARY 10/21/2012, 4:20 PM [employer], OTR/L  [phone] 10/21/2012

## 2012-10-21 NOTE — Progress Notes (Signed)
While checking placement and residuals from NGT unable to draw back or hear confirmation of placement. CCM notified. Orders for KUB initiated

## 2012-10-21 NOTE — Procedures (Signed)
Objective Swallowing Evaluation: Fiberoptic Endoscopic Evaluation of Swallowing  Patient Details  Name: [name] MRN: [id number] Date of Birth: 26-Jan-1961  Today's Date: 10/21/2012 Time: 0850-0930 SLP Time Calculation (min): 40 min  Past Medical History:  Past Medical History  Diagnosis Date  . CAD (coronary artery disease)     Cath 09, 99% LAD  . Cardiomyopathy, ischemic     Ischemic  . Obesity   . Fibromyalgia   . Shortness of breath   . CHF (congestive heart failure)   . Hypertension     dr Percival Spanish  . Cyst near coccyx   . Diabetes mellitus without complication     borderline  . Cyst near tailbone    Past Surgical History:  Past Surgical History  Procedure Laterality Date  . Coronary artery bypass graft      LIMA to LAD 2009  . Umbilical hernia repair    . Anal fistulectomy    . Retrosigmoid craniectomy for tumor resection Right 10/11/2012    Procedure: RETROSIGMOID CRANIECTOMY FOR TUMOR RESECTION;  Surgeon: [name], MD;  Location: [place] [facility];  Service: Neurosurgery;  Laterality: Right;  Craniotomy for acoustic neuroma  . Tracheostomy tube placement N/A 10/11/2012    Procedure: TRACHEOSTOMY;  Surgeon: Ascencion Dike, MD;  Location: [facility];  Service: ENT;  Laterality: N/A;  . Acoustic neuroma resection N/A 10/11/2012    Procedure: ACOUSTIC NEUROMA RESECTION;  Surgeon: Ascencion Dike, MD;  Location: [facility];  Service: ENT;  Laterality: N/A;   HPI:  52 yr old admitted with 3 months history of decreased coordination and balance.  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  Pt. tolerating ATC since 2/27. PMH:  HTN, MI, CABG.  Lurline Idol was downsized to # 6 2/28 and pt. was decannulated this am prior to FEES.     Assessment / Plan / Recommendation Clinical Impression  Dysphagia Diagnosis: Mild oral phase dysphagia;Mild pharyngeal phase dysphagia;Moderate  pharyngeal phase dysphagia Clinical impression: Pt. exhibited a mild oral dysphgia with prolonged prep, manipulation and transit with cracker bolus.  Pt.'s was decannulated approximately 30 minutes prior to FEES, therefore suboptimal pressures present for deglutition.  Pharyngeal dysphagia is described as mild-moderate with both sensory and motor impairments.  Delayed swallow initiation present with thinner consistencies to valleculae and pyriform sinuses caused by decreased sensation.  Thin liquid sips via straw resulted in penetration/aspiration? prior to swallow initiation with immediate reflexive cough to clear laryngeal vestibule.  Minimal pyriform sinus residue throughout study due to slightly decreased laryngeal elevation.  Suspect cervical esophageal dysfunction due to intermittent and mild po's appearing in pyriorm sinuses several secondspost swallow.  Recommend Dys 3 diet texture and thin liquid via cup (no straws), pills whole in applesauce and full supervision as pt. exhibits impulsivity.  SLP will continue to follow for dysphagia treatment.      Treatment Recommendation  Therapy as outlined in treatment plan below    Diet Recommendation Dysphagia 3 (Mechanical Soft);Thin liquid   Liquid Administration via: Cup;No straw Medication Administration: Whole meds with puree Supervision: Patient able to self feed;Full supervision/cueing for compensatory strategies Compensations: Slow rate;Small sips/bites Postural Changes and/or Swallow Maneuvers: Seated upright 90 degrees;Upright 30-60 min after meal    Other  Recommendations Oral Care Recommendations: Oral care BID   Follow Up Recommendations  Inpatient Rehab    Frequency and Duration min 2x/week  2 weeks   Pertinent  Vitals/Pain none    SLP Swallow Goals Patient will utilize recommended strategies during swallow to increase swallowing safety with: Minimal cueing      Reason for Referral Objectively evaluate swallowing function    Oral Phase Oral Preparation/Oral Phase Oral Phase: Impaired Oral - Solids Oral - Regular: Delayed oral transit;Weak lingual manipulation;Impaired mastication   Pharyngeal Phase Pharyngeal Phase Pharyngeal Phase: Impaired Pharyngeal - Honey Pharyngeal - Honey Cup: Pharyngeal residue - pyriform sinuses;Reduced laryngeal elevation Pharyngeal - Nectar Pharyngeal - Nectar Cup: Pharyngeal residue - pyriform sinuses;Delayed swallow initiation;Premature spillage to valleculae;Reduced laryngeal elevation Pharyngeal - Thin Pharyngeal - Thin Cup: Delayed swallow initiation;Premature spillage to valleculae;Premature spillage to pyriform sinuses;Pharyngeal residue - pyriform sinuses (min residue in interarytenoid space) Pharyngeal - Thin Straw: Penetration/Aspiration during swallow;Reduced airway/laryngeal closure;Pharyngeal residue - pyriform sinuses;Delayed swallow initiation;Premature spillage to pyriform sinuses;Penetration/Aspiration before swallow Penetration/Aspiration details (thin straw): Material enters airway, passes BELOW cords and not ejected out despite cough attempt by patient Pharyngeal - Solids Pharyngeal - Puree: Pharyngeal residue - pyriform sinuses;Reduced laryngeal elevation  Cervical Esophageal Phase    GO    Cervical Esophageal Phase Cervical Esophageal Phase: Impaired (intermittent minimal backflow into pyriforms)         Houston Siren M.Ed [employer] [phone]  10/21/2012

## 2012-10-21 NOTE — Progress Notes (Signed)
I met with patient at bedside. Lurline Idol out and he seems very frustrated that I do not understand his hand gestures of what he wants. Provided pencil and paper for communication. I await OT evaluation of his adls today so that I can begin insurance approval to admit him to inpt rehab when medically ready. Patient is in agreement. [id number]

## 2012-10-22 ENCOUNTER — Inpatient Hospital Stay (HOSPITAL_COMMUNITY)
Admission: RE | Admit: 2012-10-22 | Discharge: 2012-11-12 | DRG: 461 | Disposition: A | Discharge status: Home / Self Care | Payer: BC Managed Care – PPO | Source: Intra-hospital | Attending: Physical Medicine & Rehabilitation | Admitting: Physical Medicine & Rehabilitation

## 2012-10-22 ENCOUNTER — Encounter (HOSPITAL_COMMUNITY): Payer: Self-pay

## 2012-10-22 ENCOUNTER — Encounter (HOSPITAL_COMMUNITY): Payer: Self-pay | Admitting: Physical Medicine and Rehabilitation

## 2012-10-22 DIAGNOSIS — G519 Disorder of facial nerve, unspecified: Secondary | ICD-10-CM

## 2012-10-22 DIAGNOSIS — R269 Unspecified abnormalities of gait and mobility: Secondary | ICD-10-CM

## 2012-10-22 DIAGNOSIS — IMO0001 Reserved for inherently not codable concepts without codable children: Secondary | ICD-10-CM | POA: Diagnosis present

## 2012-10-22 DIAGNOSIS — J209 Acute bronchitis, unspecified: Secondary | ICD-10-CM

## 2012-10-22 DIAGNOSIS — R1310 Dysphagia, unspecified: Secondary | ICD-10-CM | POA: Diagnosis present

## 2012-10-22 DIAGNOSIS — N19 Unspecified kidney failure: Secondary | ICD-10-CM | POA: Diagnosis present

## 2012-10-22 DIAGNOSIS — R7301 Impaired fasting glucose: Secondary | ICD-10-CM | POA: Diagnosis present

## 2012-10-22 DIAGNOSIS — I10 Essential (primary) hypertension: Secondary | ICD-10-CM | POA: Diagnosis present

## 2012-10-22 DIAGNOSIS — N289 Disorder of kidney and ureter, unspecified: Secondary | ICD-10-CM | POA: Diagnosis present

## 2012-10-22 DIAGNOSIS — G510 Bell's palsy: Secondary | ICD-10-CM | POA: Diagnosis present

## 2012-10-22 DIAGNOSIS — D333 Benign neoplasm of cranial nerves: Secondary | ICD-10-CM | POA: Diagnosis present

## 2012-10-22 DIAGNOSIS — Z5189 Encounter for other specified aftercare: Principal | ICD-10-CM

## 2012-10-22 DIAGNOSIS — Z951 Presence of aortocoronary bypass graft: Secondary | ICD-10-CM

## 2012-10-22 DIAGNOSIS — I2589 Other forms of chronic ischemic heart disease: Secondary | ICD-10-CM | POA: Diagnosis present

## 2012-10-22 DIAGNOSIS — J411 Mucopurulent chronic bronchitis: Secondary | ICD-10-CM

## 2012-10-22 DIAGNOSIS — R5381 Other malaise: Secondary | ICD-10-CM

## 2012-10-22 DIAGNOSIS — E669 Obesity, unspecified: Secondary | ICD-10-CM

## 2012-10-22 DIAGNOSIS — E119 Type 2 diabetes mellitus without complications: Secondary | ICD-10-CM | POA: Diagnosis present

## 2012-10-22 DIAGNOSIS — M109 Gout, unspecified: Secondary | ICD-10-CM | POA: Diagnosis present

## 2012-10-22 DIAGNOSIS — I2510 Atherosclerotic heart disease of native coronary artery without angina pectoris: Secondary | ICD-10-CM | POA: Diagnosis present

## 2012-10-22 DIAGNOSIS — D72829 Elevated white blood cell count, unspecified: Secondary | ICD-10-CM | POA: Diagnosis present

## 2012-10-22 DIAGNOSIS — R490 Dysphonia: Secondary | ICD-10-CM | POA: Diagnosis present

## 2012-10-22 DIAGNOSIS — E66813 Obesity, class 3: Secondary | ICD-10-CM | POA: Diagnosis present

## 2012-10-22 HISTORY — DX: Acute myocardial infarction, unspecified: I21.9

## 2012-10-22 HISTORY — DX: Headache: R51

## 2012-10-22 LAB — BASIC METABOLIC PANEL
Chloride: 102 mEq/L (ref 96–112)
Creatinine, Ser: 0.8 mg/dL (ref 0.50–1.35)
GFR calc Af Amer: >90 mL/min (ref >90)
GFR calc non Af Amer: >90 mL/min (ref >90)

## 2012-10-22 LAB — CBC
HCT: 45.9 % (ref 39.0–52.0)
MCHC: 33.6 g/dL (ref 30.0–36.0)
Platelets: 430 10*3/uL — ABNORMAL HIGH (ref 150–400)
RDW: 13.6 % (ref 11.5–15.5)
WBC: 20 10*3/uL — ABNORMAL HIGH (ref 4.0–10.5)

## 2012-10-22 LAB — GLUCOSE, CAPILLARY
Glucose-Capillary: 200 mg/dL — ABNORMAL HIGH (ref 70–99)
Glucose-Capillary: 105 mg/dL — ABNORMAL HIGH (ref 70–99)

## 2012-10-22 MED ORDER — SACCHAROMYCES BOULARDII 250 MG PO CAPS
250.0000 mg | ORAL_CAPSULE | Freq: Two times a day (BID) | ORAL | Status: DC
  Administered 2012-10-22 – 2012-11-12 (×42): 250 mg via ORAL
  Filled 2012-10-22 (×45): qty 1

## 2012-10-22 MED ORDER — ALBUTEROL SULFATE (5 MG/ML) 0.5% IN NEBU: 2.5000 mg | INHALATION_SOLUTION | Freq: Four times a day (QID) | RESPIRATORY_TRACT | Status: DC

## 2012-10-22 MED ORDER — CHLORHEXIDINE GLUCONATE 0.12 % MT SOLN
15.0000 mL | Freq: Two times a day (BID) | OROMUCOSAL | Status: DC
  Administered 2012-10-22 – 2012-10-29 (×13): 15 mL via OROMUCOSAL
  Filled 2012-10-22 (×18): qty 15

## 2012-10-22 MED ORDER — PROCHLORPERAZINE EDISYLATE 5 MG/ML IJ SOLN
5.0000 mg | Freq: Four times a day (QID) | INTRAMUSCULAR | Status: DC | PRN
  Filled 2012-10-22: qty 2

## 2012-10-22 MED ORDER — ARTIFICIAL TEARS OP OINT
TOPICAL_OINTMENT | Freq: Three times a day (TID) | OPHTHALMIC | Status: DC
  Administered 2012-10-22 – 2012-10-31 (×23): via OPHTHALMIC
  Administered 2012-10-31: 1 via OPHTHALMIC
  Administered 2012-11-01 – 2012-11-12 (×27): via OPHTHALMIC
  Filled 2012-10-22 (×3): qty 3.5

## 2012-10-22 MED ORDER — DIPHENHYDRAMINE HCL 12.5 MG/5ML PO ELIX: 12.5000 mg | ORAL_SOLUTION | Freq: Four times a day (QID) | ORAL | Status: DC | PRN

## 2012-10-22 MED ORDER — CHLORHEXIDINE GLUCONATE 0.12 % MT SOLN: 15.0000 mL | Freq: Two times a day (BID) | OROMUCOSAL | Status: DC

## 2012-10-22 MED ORDER — BIOTENE DRY MOUTH MT LIQD: 15.0000 mL | Freq: Four times a day (QID) | OROMUCOSAL | Status: DC

## 2012-10-22 MED ORDER — FLEET ENEMA 7-19 GM/118ML RE ENEM: 1.0000 | ENEMA | Freq: Once | RECTAL | Status: AC | PRN

## 2012-10-22 MED ORDER — BISACODYL 10 MG RE SUPP
10.0000 mg | Freq: Every day | RECTAL | Status: DC | PRN
  Filled 2012-10-22: qty 1

## 2012-10-22 MED ORDER — CIPROFLOXACIN HCL 250 MG PO TABS
250.0000 mg | ORAL_TABLET | Freq: Two times a day (BID) | ORAL | Status: DC
  Administered 2012-10-22: 250 mg via ORAL
  Filled 2012-10-22 (×4): qty 1

## 2012-10-22 MED ORDER — PROCHLORPERAZINE MALEATE 5 MG PO TABS
5.0000 mg | ORAL_TABLET | Freq: Four times a day (QID) | ORAL | Status: DC | PRN
  Filled 2012-10-22: qty 2

## 2012-10-22 MED ORDER — ARTIFICIAL TEARS OP OINT: TOPICAL_OINTMENT | Freq: Three times a day (TID) | OPHTHALMIC | Status: DC

## 2012-10-22 MED ORDER — ENSURE COMPLETE PO LIQD
237.0000 mL | Freq: Two times a day (BID) | ORAL | Status: DC
  Administered 2012-10-22: 237 mL via ORAL

## 2012-10-22 MED ORDER — ALBUTEROL SULFATE (5 MG/ML) 0.5% IN NEBU: 2.5000 mg | INHALATION_SOLUTION | RESPIRATORY_TRACT | Status: DC | PRN

## 2012-10-22 MED ORDER — HYDROCODONE-ACETAMINOPHEN 5-325 MG PO TABS
1.0000 | ORAL_TABLET | ORAL | Status: DC | PRN
  Administered 2012-10-22 – 2012-11-05 (×55): 1 via ORAL
  Filled 2012-10-22 (×56): qty 1

## 2012-10-22 MED ORDER — SENNA 8.6 MG PO TABS
1.0000 | ORAL_TABLET | Freq: Two times a day (BID) | ORAL | Status: DC
Start: 2012-10-22 — End: 2012-10-24
  Administered 2012-10-22 – 2012-10-23 (×3): 8.6 mg via ORAL
  Filled 2012-10-22 (×6): qty 1

## 2012-10-22 MED ORDER — ATENOLOL 50 MG PO TABS
50.0000 mg | ORAL_TABLET | Freq: Two times a day (BID) | ORAL | Status: DC
  Administered 2012-10-22 – 2012-11-12 (×42): 50 mg via ORAL
  Filled 2012-10-22 (×44): qty 1

## 2012-10-22 MED ORDER — ALBUTEROL SULFATE (5 MG/ML) 0.5% IN NEBU
2.5000 mg | INHALATION_SOLUTION | Freq: Four times a day (QID) | RESPIRATORY_TRACT | Status: DC
  Administered 2012-10-22 – 2012-10-27 (×16): 2.5 mg via RESPIRATORY_TRACT
  Filled 2012-10-22 (×18): qty 0.5

## 2012-10-22 MED ORDER — INSULIN ASPART 100 UNIT/ML ~~LOC~~ SOLN: 0.0000 [IU] | Freq: Every day | SUBCUTANEOUS | Status: DC

## 2012-10-22 MED ORDER — ENSURE COMPLETE PO LIQD
237.0000 mL | Freq: Two times a day (BID) | ORAL | Status: DC
  Administered 2012-10-23: 237 mL via ORAL

## 2012-10-22 MED ORDER — ACETAMINOPHEN 325 MG PO TABS
325.0000 mg | ORAL_TABLET | ORAL | Status: DC | PRN
  Administered 2012-10-27 – 2012-11-12 (×7): 650 mg via ORAL
  Filled 2012-10-22 (×9): qty 2
  Filled 2012-10-22: qty 1

## 2012-10-22 MED ORDER — PROCHLORPERAZINE 25 MG RE SUPP
12.5000 mg | Freq: Four times a day (QID) | RECTAL | Status: DC | PRN
  Filled 2012-10-22: qty 1

## 2012-10-22 MED ORDER — TRAMADOL HCL 50 MG PO TABS
50.0000 mg | ORAL_TABLET | Freq: Four times a day (QID) | ORAL | Status: DC | PRN
  Administered 2012-10-25 – 2012-11-12 (×10): 50 mg via ORAL
  Filled 2012-10-22 (×11): qty 1

## 2012-10-22 MED ORDER — GUAIFENESIN-DM 100-10 MG/5ML PO SYRP
5.0000 mL | ORAL_SOLUTION | Freq: Four times a day (QID) | ORAL | Status: DC | PRN
  Administered 2012-10-24 – 2012-10-31 (×2): 10 mL via ORAL
  Filled 2012-10-22 (×2): qty 10

## 2012-10-22 MED ORDER — BIOTENE DRY MOUTH MT LIQD
15.0000 mL | Freq: Four times a day (QID) | OROMUCOSAL | Status: DC
  Administered 2012-10-24 – 2012-10-30 (×19): 15 mL via OROMUCOSAL

## 2012-10-22 MED ORDER — ALUM & MAG HYDROXIDE-SIMETH 200-200-20 MG/5ML PO SUSP: 30.0000 mL | ORAL | Status: DC | PRN

## 2012-10-22 MED ORDER — INSULIN ASPART 100 UNIT/ML ~~LOC~~ SOLN
0.0000 [IU] | Freq: Three times a day (TID) | SUBCUTANEOUS | Status: DC
  Administered 2012-10-23: 2 [IU] via SUBCUTANEOUS
  Administered 2012-10-23: 3 [IU] via SUBCUTANEOUS
  Administered 2012-10-23: 2 [IU] via SUBCUTANEOUS
  Administered 2012-10-24 – 2012-10-25 (×3): 3 [IU] via SUBCUTANEOUS
  Administered 2012-10-25 – 2012-10-26 (×4): 2 [IU] via SUBCUTANEOUS
  Administered 2012-10-27: 3 [IU] via SUBCUTANEOUS
  Administered 2012-10-28: 2 [IU] via SUBCUTANEOUS
  Administered 2012-10-28 – 2012-10-29 (×3): 3 [IU] via SUBCUTANEOUS
  Administered 2012-10-30 – 2012-11-01 (×4): 2 [IU] via SUBCUTANEOUS
  Administered 2012-11-02 – 2012-11-03 (×2): 3 [IU] via SUBCUTANEOUS
  Administered 2012-11-03 – 2012-11-06 (×4): 2 [IU] via SUBCUTANEOUS
  Administered 2012-11-07 – 2012-11-08 (×2): 3 [IU] via SUBCUTANEOUS
  Administered 2012-11-09: 2 [IU] via SUBCUTANEOUS
  Administered 2012-11-09: 3 [IU] via SUBCUTANEOUS
  Administered 2012-11-10: 2 [IU] via SUBCUTANEOUS
  Administered 2012-11-10: 8 [IU] via SUBCUTANEOUS
  Administered 2012-11-11 (×2): 2 [IU] via SUBCUTANEOUS

## 2012-10-22 MED ORDER — ALBUTEROL SULFATE (5 MG/ML) 0.5% IN NEBU
2.5000 mg | INHALATION_SOLUTION | RESPIRATORY_TRACT | Status: DC | PRN
  Administered 2012-10-24 – 2012-11-11 (×12): 2.5 mg via RESPIRATORY_TRACT
  Filled 2012-10-22 (×12): qty 0.5

## 2012-10-22 MED ORDER — HYDROCERIN EX CREA
TOPICAL_CREAM | Freq: Two times a day (BID) | CUTANEOUS | Status: DC
  Administered 2012-10-22 – 2012-11-12 (×42): via TOPICAL
  Filled 2012-10-22 (×2): qty 113

## 2012-10-22 MED ORDER — TRAZODONE HCL 50 MG PO TABS
25.0000 mg | ORAL_TABLET | Freq: Every evening | ORAL | Status: DC | PRN
  Administered 2012-11-03: 50 mg via ORAL
  Filled 2012-10-22: qty 1

## 2012-10-22 MED ORDER — NALOXONE HCL 0.4 MG/ML IJ SOLN: 0.0800 mg | INTRAMUSCULAR | Status: DC | PRN

## 2012-10-22 MED ORDER — INSULIN ASPART 100 UNIT/ML ~~LOC~~ SOLN
3.0000 [IU] | Freq: Three times a day (TID) | SUBCUTANEOUS | Status: DC
  Administered 2012-10-23 – 2012-11-12 (×57): 3 [IU] via SUBCUTANEOUS

## 2012-10-22 NOTE — H&P (Signed)
Physical Medicine and Rehabilitation Admission H&P  CC: Acoustic neuroma with dizziness, loss of hearing on right and weakness bilateral hands.  HPI: [name] is a 52 y.o. male with history of CAD, ICM, morbid obesity, who has had right sided hearing loss for the past year and two month history of difficulty using hands, hoarseness, urinary frequency as well as dizziness with balance problems requiring use of wheelchair. 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 He was admitted on 10/11/12 for acoustic neroma resection by Dr. Christella Noa and tracheostomy by Dr. Benjamine Mola. Post op required slow vent wean but tolerated ATC and was de-cannulated on 03/03. Oral secretions have decreased and FEES done revealed delay in swallow with ?aspiration/penetration with straws. Was started on D3, thin liquids. He continues to have right facial palsy, dizziness with activity and inability to phonate. He needs cues for safety and gaze stabilization. Therapy team recommended CIR level therapies for progression. Pt was admitted today for inpatient rehab.  Review of Systems  HENT: Positive for hearing loss.  Eyes: Negative for blurred vision and double vision.  Respiratory: Positive for cough, sputum production and shortness of breath.  Cardiovascular: Negative for chest pain and palpitations.  Gastrointestinal: Positive for diarrhea. Negative for heartburn and nausea.  Incontinent of bowel and bladder currently.  Genitourinary: Positive for urgency and frequency.  Musculoskeletal: Negative for myalgias.  Neurological: Positive for dizziness and speech change.   Past Medical History   Diagnosis  Date   .  CAD (coronary artery disease)      Cath 09, 99% LAD   .  Cardiomyopathy, ischemic      Ischemic   .  Obesity    .  Fibromyalgia    .  Shortness of breath    .  CHF (congestive heart failure)    .  Hypertension      dr Percival Spanish   .  Cyst near coccyx    .  Diabetes mellitus  without complication      borderline   .  Cyst near tailbone     Past Surgical History   Procedure  Laterality  Date   .  Coronary artery bypass graft       LIMA to LAD 2009   .  Umbilical hernia repair     .  Anal fistulectomy     .  Retrosigmoid craniectomy for tumor resection  Right  10/11/2012     Procedure: RETROSIGMOID CRANIECTOMY FOR TUMOR RESECTION; Surgeon: [name], MD; Location: [facility]; Service: Neurosurgery; Laterality: Right; Craniotomy for acoustic neuroma   .  Tracheostomy tube placement  N/A  10/11/2012     Procedure: TRACHEOSTOMY; Surgeon: Ascencion Dike, MD; Location: [facility]; Service: ENT; Laterality: N/A;   .  Acoustic neuroma resection  N/A  10/11/2012     Procedure: ACOUSTIC NEUROMA RESECTION; Surgeon: Ascencion Dike, MD; Location: [facility]; Service: ENT; Laterality: N/A;    Family History   Problem  Relation  Age of Onset   .  CAD  Father     Social History: Married. Has been limited for past few months. Tobacco-has smoked 1/2 PPD for past 20 years. Dips snuff  Allergies   Allergen  Reactions   .  Morphine And Related  Anaphylaxis     "makes me stop breathing"   .  Other      Steroids makes hearts race    Medications Prior to Admission   Medication  Sig  Dispense  Refill   .  atenolol (TENORMIN) 25 MG tablet  Take 25 mg by mouth 2 (two) times daily.     Marland Kitchen  HYDROcodone-acetaminophen (VICODIN) 5-500 MG per tablet  Take 1 tablet by mouth every 6 (six) hours as needed. For pain      Home:  Home Living  Lives With: Spouse;Family  Available Help at Discharge: Family  Type of Home: Mobile home  Home Access: Ramped entrance  Home Layout: One level  Bathroom Shower/Tub: Tub/shower unit  Home Adaptive Equipment: Walker - rolling;Wheelchair - Scientist, research (medical) with back  Functional History:  Prior Function  Bath: Minimal  Meal Prep: Maximal  Light Housekeeping: Minimal  Able to Take Stairs?: No  Driving: No  Comments: iwfe assisted  minimallywith ADL. Used RW for ambulation. w/c prior to admission due to dizziness  Functional Status:  Mobility:  Bed Mobility  Bed Mobility: Supine to Sit  Rolling Left: 4: Min assist  Left Sidelying to Sit: 4: Min assist;With rails;HOB elevated  Supine to Sit: 4: Min assist;HOB elevated  Transfers  Transfers: Sit to Stand;Stand to Sit  Sit to Stand: 3: Mod assist;With upper extremity assist;From bed  Sit to Stand: Patient Percentage: 70%  Stand to Sit: 3: Mod assist;To chair/3-in-1  Stand to Sit: Patient Percentage: 80%  Stand Pivot Transfers: 1: +2 Total assist  Stand Pivot Transfers: Patient Percentage: 70%  Ambulation/Gait  Ambulation/Gait Assistance: 1: +2 Total assist  Ambulation/Gait: Patient Percentage: 80%  Ambulation Distance (Feet): 4 Feet  Assistive device: Rolling walker  Ambulation/Gait Assistance Details: cueing for gaze stabilization, decreased speed and use of RW  Gait Pattern: Shuffle;Wide base of support  Gait velocity: decreased   ADL:  ADL  Eating/Feeding: Supervision/safety  Where Assessed - Eating/Feeding: Edge of bed  Grooming: Supervision/safety;Set up  Where Assessed - Grooming: Supported sitting  Upper Body Bathing: Supervision/safety;Set up  Where Assessed - Upper Body Bathing: Supported sitting  Lower Body Bathing: +1 Total assistance  Where Assessed - Lower Body Bathing: Supported sit to stand  Upper Body Dressing: Moderate assistance  Where Assessed - Upper Body Dressing: Supported sitting  Lower Body Dressing: +1 Total assistance  Where Assessed - Lower Body Dressing: Supported sit to Retail buyer: Moderate assistance  Toilet Transfer Method: Stand pivot  Toilet Transfer Equipment: Extra wide bedside commode  Equipment Used: Gait belt  Transfers/Ambulation Related to ADLs: Used squat pivot transfer. Pt with good strength. Educated pt on keeping focal point during transfers  ADL Comments: Limited at this time due to dizziness,  body habitus and general wekaness  Cognition:  Cognition  Arousal/Alertness: Awake/alert  Orientation Level: Oriented X4  Cognition  Overall Cognitive Status: Appears within functional limits for tasks assessed/performed  Arousal/Alertness: Awake/alert  Orientation Level: Appears intact for tasks assessed  Behavior During Session: [facility] for tasks performed  Cognition - Other Comments: Pt frustrated at times with difficulty with communication. Using letter board to communicate well. Pt informed me that he owns [employer]  Physical Exam:  Blood pressure 145/73, pulse 58, temperature 98.3 F (36.8 C), temperature source Oral, resp. rate 18, height 5\' 11"  (1.803 m), weight 149.7 kg (330 lb 0.5 oz), SpO2 94.00%.  Physical Exam  Nursing note and vitals reviewed.  Constitutional: He is oriented to person, place, and time. He appears well-developed and well-nourished.  Morbidly obese male with right facial paresis and thick yellow drainage occasionally spurting out of tracheal stoma.  HENT:  Head: Normocephalic and atraumatic.  Mouth/Throat: Abnormal dentition. Dental caries present. Oropharyngeal exudate present.  Neck: Normal range of motion.  Large tracheal stoma with thick, yellow, purulent appearing drainage. Minimal phonation. Communicates with whisper and hand signals. Cardiovascular: Normal rate and regular rhythm.  Pulmonary/Chest: Effort normal. He has decreased breath sounds in the right lower field and the left lower field. He has rhonchi.  Abdominal: Soft. Bowel sounds are normal. He exhibits no distension. There is no tenderness.  Musculoskeletal: He exhibits edema (1+ pedal edema.).  Bilateral fore and mid foot as well as toes are purple and cold to touch ("like it that way") +pedal pulses felt.  Neurological: He is alert and oriented to person, place, and time.  Dysphonic. Right facial droop, frontal droop, unable to close right eyelid. Impulsive and impatient. Able to follow  basic commands without difficulty. BUE with weakness and decrease in fine motor movements right greater than left. Strength grossly 3 to 3+ UE, 2+ to 3, LE's.  Skin: Skin is warm and dry.  Deep tissue injury of buttocks. Left lateral thigh with quarter size stage 2 ulcer. Stasis changes bilateral shins. Heels with dry skin.   Results for orders placed during the hospital encounter of 10/11/12 (from the past 48 hour(s))   GLUCOSE, CAPILLARY Status: Abnormal    Collection Time    10/20/12 12:14 PM   Result  Value  Range    Glucose-Capillary  200 (*)  70 - 99 mg/dL   GLUCOSE, CAPILLARY Status: Abnormal    Collection Time    10/20/12 4:01 PM   Result  Value  Range    Glucose-Capillary  188 (*)  70 - 99 mg/dL   GLUCOSE, CAPILLARY Status: Abnormal    Collection Time    10/20/12 7:42 PM   Result  Value  Range    Glucose-Capillary  166 (*)  70 - 99 mg/dL   GLUCOSE, CAPILLARY Status: Abnormal    Collection Time    10/21/12 3:59 AM   Result  Value  Range    Glucose-Capillary  172 (*)  70 - 99 mg/dL   GLUCOSE, CAPILLARY Status: Abnormal    Collection Time    10/21/12 7:56 AM   Result  Value  Range    Glucose-Capillary  173 (*)  70 - 99 mg/dL    Comment 1  Notify RN    GLUCOSE, CAPILLARY Status: Abnormal    Collection Time    10/21/12 11:33 AM   Result  Value  Range    Glucose-Capillary  218 (*)  70 - 99 mg/dL   GLUCOSE, CAPILLARY Status: Abnormal    Collection Time    10/21/12 4:30 PM   Result  Value  Range    Glucose-Capillary  181 (*)  70 - 99 mg/dL    Comment 1  Notify RN    GLUCOSE, CAPILLARY Status: Abnormal    Collection Time    10/21/12 9:01 PM   Result  Value  Range    Glucose-Capillary  121 (*)  70 - 99 mg/dL   BASIC METABOLIC PANEL Status: Abnormal    Collection Time    10/22/12 6:17 AM   Result  Value  Range    Sodium  143  135 - 145 mEq/L    Potassium  3.9  3.5 - 5.1 mEq/L    Chloride  102  96 - 112 mEq/L    CO2  34 (*)  19 - 32 mEq/L    Glucose, Bld  136 (*)   70 -  99 mg/dL    BUN  36 (*)  6 - 23 mg/dL    Creatinine, Ser  0.80  0.50 - 1.35 mg/dL    Calcium  10.0  8.4 - 10.5 mg/dL    GFR calc non Af Amer  >90  >90 mL/min    GFR calc Af Amer  >90  >90 mL/min    Comment:      The eGFR has been calculated     using the CKD EPI equation.     This calculation has not been     validated in all clinical     situations.     eGFR's persistently     <90 mL/min signify     possible Chronic Kidney Disease.   CBC Status: Abnormal    Collection Time    10/22/12 6:17 AM   Result  Value  Range    WBC  20.0 (*)  4.0 - 10.5 K/uL    RBC  4.95  4.22 - 5.81 MIL/uL    Hemoglobin  15.4  13.0 - 17.0 g/dL    HCT  45.9  39.0 - 52.0 %    MCV  92.7  78.0 - 100.0 fL    MCH  31.1  26.0 - 34.0 pg    MCHC  33.6  30.0 - 36.0 g/dL    RDW  13.6  11.5 - 15.5 %    Platelets  430 (*)  150 - 400 K/uL   GLUCOSE, CAPILLARY Status: Abnormal    Collection Time    10/22/12 7:48 AM   Result  Value  Range    Glucose-Capillary  127 (*)  70 - 99 mg/dL    Post Admissio8n Physician Evaluation:  1. Functional deficits secondary to right sided acoustic neuroma s/p resection with subsequent right facial nerve palsy, vesitbular deficits, dysphagia, dysphonia. 2. Patient is admitted to receive collaborative, interdisciplinary care between the physiatrist, rehab nursing staff, and therapy team. 3. Patient's level of medical complexity and substantial therapy needs in context of that medical necessity cannot be provided at a lesser intensity of care such as a SNF. 4. Patient has experienced substantial functional loss from his/her baseline which was documented above under the "Functional History" and "Functional Status" headings. Judging by the patient's diagnosis, physical exam, and functional history, the patient has potential for functional progress which will result in measurable gains while on inpatient rehab. These gains will be of substantial and practical use upon discharge in  facilitating mobility and self-care at the household level. 5. Physiatrist will provide 24 hour management of medical needs as well as oversight of the therapy plan/treatment and provide guidance as appropriate regarding the interaction of the two. 6. 24 hour rehab nursing will assist with bladder management, bowel management, safety, skin/wound care, disease management, medication administration, pain management and patient education and help integrate therapy concepts, techniques,education, etc. 7. PT will assess and treat for/with: Lower extremity strength, range of motion, stamina, balance, functional mobility, safety, adaptive techniques and equipment, pain mgt, education. Goals are: supervision to minimal assist . 8. OT will assess and treat for/with: ADL's, functional mobility, safety, upper extremity strength, adaptive techniques and equipment, balance vestibular rehab. Goals are: supervision to minimal assist. 9. SLP will assess and treat for/with: speech, communication, swallowing. Goals are: supervision to minimal assist. 10. Case Management and Social Worker will assess and treat for psychological issues and discharge planning. 11. Team conference will be held weekly to assess progress toward goals and to determine  barriers to discharge. 12. Patient will receive at least 3 hours of therapy per day at least 5 days per week. 13. ELOS: 2 to 2.5 weeks Prognosis: excellent Medical Problem List and Plan:  1. DVT Prophylaxis/Anticoagulation: Mechanical: Antiembolism stockings, knee (TED hose) Bilateral lower extremities  Sequential compression devices, below knee Bilateral lower extremities. ?start lovenox. Will check dopplers.  2. Pain Management: N/A. Will add prn ultram.  3. Mood: Provide ego support and redirection. LCSW to follow for evaluation.  4. Neuropsych: This patient is capable of making decisions on his/her own behalf.  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 Continue Lacrilube tid  with patching at nights to prevent corneal abrasion.  6. CAD: continue atenolol bid. Patient has declined statin per reports.  7. DM type 2- diet controlled: Will monitor BS with AC/HS CBG checks. Use SSI for elevated BS. Currently 120-200 range likely due to stress and stress dose steroids.  8. Chronic LE edema: Low salt diet. Add TEDs.  9. Leukocytosis: Will check urine culture. Will start cipro for serratia tracheobronchitis.  8. HTN: Monitor with bid checks. Continue tenormin bid. Blood pressures have been variable and will adjust as needed.  9. Acute renal insufficiency: Due to dehydration? Encourage po fluid intake  10/22/2012 Meredith Staggers, MD, Mellody Drown

## 2012-10-22 NOTE — Progress Notes (Signed)
Physical Therapy Treatment Patient Details Name: ELAZAR BETHKE MRN: [id number] DOB: 1961/01/24 Today's Date: 10/22/2012 Time: [id number] PT Time Calculation (min): 29 min  PT Assessment / Plan / Recommendation Comments on Treatment Session  Pt s/p acoustic neuroma resectiona and trach removal. Pt continues to have difficulty occulding trach site and speaking reporting pain and using mouthing and letter board for majority of communication. Pt continues to state dizziness and double vision limiting ability to tolerate activity but is progressing. Cueing for safety due to impulsivity and uncontrolled sitting. Encouraged continued HEP.    Follow Up Recommendations        Does the patient have the potential to tolerate intense rehabilitation     Barriers to Discharge        Equipment Recommendations       Recommendations for Other Services    Frequency     Plan Discharge plan remains appropriate;Frequency remains appropriate    Precautions / Restrictions Precautions Precautions: Fall Precaution Comments: dizziness with mobility, deaf R ear, blind R eye, double vision   Pertinent Vitals/Pain No pain, dizziness as an 11/10 sats 93% on 3L and HR 81-107 with activity    Mobility  Bed Mobility Bed Mobility: Not assessed Transfers Transfers: Sit to Stand;Stand to Sit Sit to Stand: 1: +2 Total assist;From chair/3-in-1;With armrests Sit to Stand: Patient Percentage: 70% Stand to Sit: 3: Mod assist;To chair/3-in-1 Details for Transfer Assistance: Pt with max cueing for hand placement, safety and technique. Pt sitting prematurely x 3 and required chair directly behind pt each standing trial due to lack of reaching back and notification of need to sit. Pt stood from chair and with initiation of gait immediately sat first trial but repeated trials able to ambulate. Ambulation/Gait Ambulation/Gait Assistance: 1: +2 Total assist Ambulation/Gait: Patient Percentage: 80% Ambulation Distance  (Feet): 15 Feet (x 2 trials with seated rest between) Assistive device: Rolling walker Ambulation/Gait Assistance Details: cueing for gaze stabilization, position in RW and encouragement to increase distance, chair directly behind pt throughout Gait Pattern: Wide base of support;Trunk flexed;Shuffle Gait velocity: decreased    Exercises General Exercises - Lower Extremity Long Arc Quad: AROM;Both;20 reps;Seated Hip Flexion/Marching: AROM;Both;20 reps;Seated   PT Diagnosis:    PT Problem List:   PT Treatment Interventions:     PT Goals Acute Rehab PT Goals PT Goal: Sit to Stand - Progress: Progressing toward goal PT Goal: Stand to Sit - Progress: Progressing toward goal PT Goal: Ambulate - Progress: Progressing toward goal  Visit Information  Last PT Received On: 10/22/12 Assistance Needed: +2    Subjective Data  Subjective: dizzy   Cognition  Cognition Overall Cognitive Status: Impaired Area of Impairment: Safety/judgement Arousal/Alertness: Awake/alert Orientation Level: Appears intact for tasks assessed Behavior During Session: [facility] for tasks performed Safety/Judgement: Decreased safety judgement for tasks assessed;Impulsive Safety/Judgement - Other Comments: Pt sitting prematurely repeatedly despite education for safety    Balance     End of Session PT - End of Session Equipment Utilized During Treatment: Oxygen Activity Tolerance: Patient tolerated treatment well Patient left: in chair;with call bell/phone within reach Nurse Communication: Mobility status   GP     Melford Aase 10/22/2012, 12:44 PM Elwyn Reach, [place]

## 2012-10-22 NOTE — Discharge Summary (Signed)
Physician Discharge Summary  Patient ID: [name] MRN: [id number] DOB/AGE: 11-08-1960 51 y.o.  Admit date: 10/11/2012 Discharge date: 10/22/2012  Admission Diagnoses:Acoustic Neuroma  Discharge Diagnoses:  Active Problems:   Morbid obesity   Acoustic neuroma   Status post craniotomy   Respiratory failure, post-operative   Tracheostomy status   Discharged Condition: good  Hospital Course: Mr. Kruszynski was taken to the operating room where he underwent a partial resection of a large acoustic neuroma. Post op he has done well. He had a tracheostomy placed at the time of his craniotomy, which has been removed. He had significant balance issues preoperatively which left him unable to ambulate and he was in a wheelchair. Post op films show a very substantial decompression of the brain stem and resection of the tumor. He is tolerating a regular diet, and voiding. His wound is slightly erythematous, there has been no drainage. He is discharged to the rehab service.  Consults: rehabilitation medicine  Significant Diagnostic Studies: none  Treatments: surgery:  Tracheostomy. 2. Right partial acoustic neuroma resection (internal auditory canal). RETROSIGMOID CRANIECTOMY FOR TUMOR RESECTION TRACHEOSTOMY ACOUSTIC NEUROMA RESECTION   Discharge Exam: Blood pressure 116/82, pulse 107, temperature 98.4 F (36.9 C), temperature source Oral, resp. rate 16, height 5\' 11"  (1.803 m), weight 149.7 kg (330 lb 0.5 oz), SpO2 94.00%. 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, deaf in right ear Motor: grossly normal Coordination: poor coordination Gait: Abnormal  Disposition:    Future Appointments Provider Department Dept Phone   10/23/2012 8:30 AM [place]  [place] [phone]   10/23/2012 10:30 AM Merrilee Seashore, [place]  [place] [phone]   10/23/2012 1:00 PM Buzzy Han, [place]  [place] [phone]   10/23/2012 2:00 PM Leroy Libman, [place]  [address]  [place] [phone]       Medication List    TAKE these medications       albuterol (5 MG/ML) 0.5% nebulizer solution  Commonly known as:  PROVENTIL  Take 0.5 mLs (2.5 mg total) by nebulization every 6 (six) hours.     albuterol (5 MG/ML) 0.5% nebulizer solution  Commonly known as:  PROVENTIL  Take 0.5 mLs (2.5 mg total) by nebulization every 3 (three) hours as needed for wheezing or shortness of breath.     antiseptic oral rinse Liqd  15 mLs by Mouth Rinse route QID.     artificial tears Oint ophthalmic ointment  Apply to eye every 8 (eight) hours.     atenolol 25 MG tablet  Commonly known as:  TENORMIN  Take 25 mg by mouth 2 (two) times daily.     chlorhexidine 0.12 % solution  Commonly known as:  PERIDEX  Use as directed 15 mLs in the mouth or throat 2 (two) times daily.     HYDROcodone-acetaminophen 5-500 MG per tablet  Commonly known as:  VICODIN  Take 1 tablet by mouth every 6 (six) hours as needed. For pain           Follow-up Information   Follow up with CABBELL,KYLE L, MD. (2 weeks after discharge from rehab)    Contact information:   [address], [address]  [address] [place] [address] [phone]       Signed: CABBELL,KYLE L 10/22/2012, 6:23 PM

## 2012-10-22 NOTE — Progress Notes (Signed)
Pt Discharged from 2600 to Inpatient Rehab; Report given to [place], RN 4000; all questions answered.

## 2012-10-22 NOTE — Progress Notes (Signed)
Speech Language Pathology Dysphagia Treatment Patient Details Name: [name] MRN: [id number] DOB: 12-10-1960 Today's Date: 10/22/2012 Time: [id number] SLP Time Calculation (min): 25 min  Assessment / Plan / Recommendation Clinical Impression  Pt. alert and assited to reposition himself in bed for dysphagia treatment (2 pillows behind back).  Stoma is very patent after trach removed yesterday and covered with gauze.  Pt. required max verba/tactile/visual cues to apply pressure to gauze with left hand/fingers for stoma occlusion to provide optimal pressures for swallow.  Required mod-moderate verbal and tactile assist for accuracy occluding stoma during phonation as well.  Right buccal cavity pocketing and decreased mastication and manipulation of cracker due to significant right labial weakness with right labial leakage with liquids.  Pt. in agreement to downgrade texture to Dys 2 (minced/ground).  No s/s aspiration with liquids.  Recommend continue thin liquids, small sips and manual occlusion over gauze/stoma.       Diet Recommendation  Initiate / Change Diet: Dysphagia 2 (fine chop);Thin liquid    SLP Plan Continue with current plan of care   Pertinent Vitals/Pain none   Swallowing Goals  SLP Swallowing Goals Patient will utilize recommended strategies during swallow to increase swallowing safety with: Minimal cueing Swallow Study Goal #2 - Progress: Progressing toward goal  General Temperature Spikes Noted: No Respiratory Status: Room air Behavior/Cognition: Alert;Requires cueing;Cooperative Oral Cavity - Dentition: Poor condition Patient Positioning: Upright in bed  Oral Cavity - Oral Hygiene Brush patient's teeth BID with toothbrush (using toothpaste with fluoride): Yes Patient is AT RISK - Oral Care Protocol followed (see row info): Yes   Dysphagia Treatment Treatment focused on: Skilled observation of diet tolerance;Utilization of compensatory strategies Treatment  Methods/Modalities: Skilled observation Patient observed directly with PO's: Yes Type of PO's observed: Dysphagia 3 (soft);Thin liquids Feeding: Able to feed self Liquids provided via: Cup;No straw Oral Phase Signs & Symptoms: Left pocketing;Prolonged bolus formation;Prolonged mastication;Prolonged oral phase;Anterior loss/spillage Type of cueing: Verbal;Visual Amount of cueing: Maximal        Houston Siren M.Ed [employer] [phone]  10/22/2012

## 2012-10-22 NOTE — Progress Notes (Addendum)
I have insurance approval to admit pt to IP rehab when medically ready. ? Today. I will follow up with attending. [id number] I spoke with Dr. Christella Noa and we will plan admit to IP rehab today.

## 2012-10-22 NOTE — Progress Notes (Signed)
NUTRITION FOLLOW UP  Intervention:   1.  Supplements; Magic cup BID with meals for 290 kcal, 9g protein per container and  Ensure Complete BID between meals to provide 350 kcal, 13g protein per container.  RD to monitor for adequacy of intake and will adjust supplement regimen based on nutrition performance.   Nutrition Dx:   Inadequate oral intake, ongoing  Monitor:   1.  Food/Beverage; improvement in intake to meet >/=90% estimated needs.  Pt to tolerate PO diet 2.  Wt/wt change; monitor trends.  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  Pt also with trach placement.  Pt wheelchair bound for the past month r/t dizziness and difficulty with balance.   Pt maintained on Promote during vent/weaning process and is now on trach collar.  Pt evaluated by SLP yesterday who determined pt appropriate for Dysphagia 3, thin liquids. Pt diet was downgraded to Dysphagia 2 today.  PO intake today has been 15% of meal x1.  Note plans to d/c to CIR. Pt working with PT at time of visit.   Height: Ht Readings from Last 1 Encounters:  10/12/12 5\' 11"  (1.803 m)    Weight Status:   Wt Readings from Last 1 Encounters:  10/21/12 330 lb 0.5 oz (149.7 kg)  Pt with significant wt change since admission from 361-330 lbs with ongoing edema noted. Per chart review, pt with hx of variable wt from 365-380 lbs with propensity for edema.  Usual/actual wt difficult to assess  Re-estimated needs:  Kcal: 2400-2690 Protein: 110-127g Fluid: >2.4 L/day  Skin: incision, blisters, skin tears, deep tissue injury  Diet Order: Dysphagia 2, thin   Intake/Output Summary (Last 24 hours) at 10/22/12 1137 Last data filed at 10/22/12 1000  Gross per 24 hour  Intake    345 ml  Output    404 ml  Net    -59 ml    Last BM: 3/2   Labs:   Recent Labs Lab 10/17/12 0600 10/18/12 0535 10/22/12 0617  NA 144 145 143  K 4.4 4.2 3.9  CL 101 105 102  CO2 34* 35* 34*  BUN 43*  43* 36*  CREATININE 0.75 0.70 0.80  CALCIUM 10.1 9.9 10.0  MG 2.2  --   --   PHOS 3.0  --   --   GLUCOSE 170* 210* 136*    CBG (last 3)   Recent Labs  10/21/12 1630 10/21/12 2101 10/22/12 0748  GLUCAP 181* 121* 127*    Scheduled Meds: . albuterol  2.5 mg Nebulization Q6H  . antiseptic oral rinse  15 mL Mouth Rinse QID  . artificial tears   Right Eye Q8H  . atenolol  50 mg Oral BID  . chlorhexidine  15 mL Mouth Rinse BID  . insulin aspart  0-15 Units Subcutaneous TID WC  . insulin aspart  0-5 Units Subcutaneous QHS  . insulin aspart  3 Units Subcutaneous TID WC  . senna  1 tablet Oral BID    Continuous Infusions: . dextrose 30 mL/hr at 10/22/12 1000    Brynda Greathouse, MS RD LDN Clinical Inpatient Dietitian Pager: [phone] Weekend/After hours pager: [phone]

## 2012-10-22 NOTE — PMR Pre-admission (Signed)
PMR Admission Coordinator Pre-Admission Assessment  Patient: [name] is an 52 y.o., male MRN: [id number] DOB: 24-Oct-1960 Height: 5\' 11"  (180.3 cm) Weight: 149.7 kg (330 lb 0.5 oz)              Insurance Information HMO:     PPO: yes     PCP:      IPA:      80/20:      OTHER:  PRIMARYRickey Arellano of [place]      Policy#: [id number]      Subscriber: wife/[employer] CM Name: [name]      Phone#: [phone]     Fax#: [phone] Pre-Cert#: [id number]      Employer: [employer] Benefits:  Phone #: [phone]     Name: 10/21/12 [name]. Date: 08/21/08 active     Deduct: none      Out of Pocket Max: none      Life Max: none CIR: $250 copay per admission      SNF: $250 copay per admission as medically necessary Outpatient: $25  copay     Co-Pay: 24 PT and 30 PT annually Home Health: $10 copay      Co-Pay: as medically necessary DME: 100%     Co-Pay: [phone] Providers: in network  SECONDARY: none        Medicaid Application Date:       Case Manager:  Disability Application Date:       Case Worker:   Emergency Contact Information Contact Information   Name Relation Home Work [place] [phone]   [place] Mother [phone]       Current Medical History  Patient Admitting Diagnosis: 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 male with history of CAD, ICM, morbid obesity, who has had right sided hearing loss for the past year and two month history of difficulty using hands, hoarseness, urinary frequency as well as dizziness with balance problems requiring use of wheelchair. 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 He was admitted on [id number] for acoustic neroma resection by Dr. Christella Noa and tracheostomy by Dr. Benjamine Mola. Post op required slow vent wean.  Postop CT scan shows good tumor  resection and decompression. Righ facial palsy. Trach removed 10/21/12. FEES 3/3 and pt started on D3 diet with thin liquids. On 3/4 SLP downgraded pt to Dysphagia 2 diet with thin liquids. Yonker suction self for oral secretions. Occlude trach site with gauze when eating and to speak. Patient easily frustrated with difficulty with communication.  Past Medical History  Past Medical History  Diagnosis Date  . CAD (coronary artery disease)     Cath 09, 99% LAD  . Cardiomyopathy, ischemic     Ischemic  . Obesity   . Fibromyalgia   . Shortness of breath   . CHF (congestive heart failure)   . Hypertension     dr Percival Spanish  . Cyst near coccyx   . Diabetes mellitus without complication     borderline  . Cyst near tailbone   . Kidney stones     Family History  family history includes CAD in his father.  Prior Rehab/Hospitalizations: none   Current Medications  Current facility-administered medications:albuterol (PROVENTIL) (5 MG/ML) 0.5% nebulizer solution 2.5 mg, 2.5 mg, Nebulization, Q6H, Elsie Stain, MD, 2.5 mg at 10/22/12 0802;  albuterol (PROVENTIL) (5 MG/ML) 0.5%  nebulizer solution 2.5 mg, 2.5 mg, Nebulization, Q3H PRN, Wilhelmina Mcardle, MD;  antiseptic oral rinse (BIOTENE) solution 15 mL, 15 mL, Mouth Rinse, QID, Marlyce Huge, MD, 15 mL at 10/22/12 0506 artificial tears (LACRILUBE) ophthalmic ointment, , Right Eye, Q8H, Ascencion Dike, MD;  atenolol (TENORMIN) tablet 50 mg, 50 mg, Oral, BID, Elsie Stain, MD, 50 mg at 10/22/12 [id number];  chlorhexidine (PERIDEX) 0.12 % solution 15 mL, 15 mL, Mouth Rinse, BID, Marlyce Huge, MD, 15 mL at 10/22/12 [id number];  dextrose 5 % solution, , Intravenous, Continuous, Vivi Ferns, MD, Last Rate: 30 mL/hr at 10/22/12 1100 HYDROcodone-acetaminophen (NORCO/VICODIN) 5-325 MG per tablet 1 tablet, 1 tablet, Oral, Q4H PRN, [name], MD, 1 tablet at 10/19/12 2059;  insulin aspart (novoLOG) injection 0-15 Units, 0-15 Units, Subcutaneous, TID WC, Wilhelmina Mcardle, MD, 2 Units at 10/22/12 0831;  insulin aspart (novoLOG) injection 0-5 Units, 0-5 Units, Subcutaneous, QHS, Wilhelmina Mcardle, MD insulin aspart (novoLOG) injection 3 Units, 3 Units, Subcutaneous, TID WC, Wilhelmina Mcardle, MD, 3 Units at 10/22/12 [phone];  labetalol (NORMODYNE,TRANDATE) injection 10-40 mg, 10-40 mg, Intravenous, Q10 min PRN, [name], MD, 20 mg at 10/14/12 0905;  naloxone [facility]) injection 0.08 mg, 0.08 mg, Intravenous, PRN, [name], MD;  ondansetron (ZOFRAN) injection 4 mg, 4 mg, Intravenous, Q4H PRN, [name], MD oxyCODONE (Oxy IR/ROXICODONE) immediate release tablet 5-10 mg, 5-10 mg, Oral, Q4H PRN, [name], MD, 5 mg at 10/19/12 0817;  promethazine (PHENERGAN) tablet 12.5-25 mg, 12.5-25 mg, Oral, Q4H PRN, [name], MD;  senna (SENOKOT) tablet 8.6 mg, 1 tablet, Oral, BID, [name], MD, 8.6 mg at 10/22/12 [id number]  Patients Current Diet: Dysphagia 2 diet with thin liquids. Downgraded 10/22/12 by SLP  Precautions / Restrictions Precautions Precautions: Fall Precaution Comments: dizziness with all mobility Restrictions Weight Bearing Restrictions: No   Prior Activity Level Household: limited for close to a year   Development worker, international aid / [place] Devices/Equipment: Gilford Rile (specify type) Home Adaptive Equipment: Walker - rolling;Wheelchair - Scientist, research (medical) with back  Prior Functional Level Prior Function Level of Independence:  (used rollator for short disctances) Needs Assistance:  (wife sponge bathing him) Bath: Total Meal Prep: Maximal Light Housekeeping: Minimal Able to Take Stairs?: No Driving: No 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 Used RW for ambulation. w/c prior to admission due to dizziness  Current Functional Level Cognition  Arousal/Alertness: Awake/alert Overall Cognitive Status: Appears within functional limits for tasks  assessed/performed Orientation Level: Other (comment) (UTA Assess) Cognition - Other Comments: Pt frustrated at times with difficulty with communication. Using letter board to communicate well. Pt informed me that he owns [place]    Extremity Assessment (includes Sensation/Coordination)  RUE ROM/Strength/Tone: [facility] for tasks assessed  RLE ROM/Strength/Tone: WFL for tasks assessed RLE Sensation: WFL - Light Touch    ADLs  Eating/Feeding: Supervision/safety Where Assessed - Eating/Feeding: Edge of bed Grooming: Supervision/safety;Set up Where Assessed - Grooming: Supported sitting Upper Body Bathing: Supervision/safety;Set up Where Assessed - Upper Body Bathing: Supported sitting Lower Body Bathing: +1 Total assistance Where Assessed - Lower Body Bathing: Supported sit to stand Upper Body Dressing: Moderate assistance Where Assessed - Upper Body Dressing: Supported sitting Lower Body Dressing: +1 Total assistance Where Assessed - Lower Body Dressing: Supported sit to Lobbyist: Moderate assistance Toilet Transfer Method: Stand pivot Toilet Transfer Equipment: Extra wide bedside commode Toileting - Clothing  Manipulation and Hygiene: Maximal assistance Where Assessed - Toileting Clothing Manipulation and Hygiene: Lean right and/or left Equipment Used: Gait belt Transfers/Ambulation Related to ADLs: Used squat pivot transfer. Pt with good strength. Educated pt on keeping focal point during transfers ADL Comments: Limited at this time due to dizziness, body habitus and general wekaness    Mobility  Bed Mobility: Supine to Sit Rolling Left: 4: Min assist Left Sidelying to Sit: 4: Min assist;With rails;HOB elevated Supine to Sit: 4: Min assist;HOB elevated    Transfers  Transfers: Sit to Stand;Stand to Sit Sit to Stand: 3: Mod assist;With upper extremity assist;From bed Sit to Stand: Patient Percentage: 70% Stand to Sit: 3: Mod assist;To chair/3-in-1 Stand to  Sit: Patient Percentage: 80% Stand Pivot Transfers: 1: +2 Total assist Stand Pivot Transfers: Patient Percentage: 70%    Ambulation / Gait / Stairs / Wheelchair Mobility  Ambulation/Gait Ambulation/Gait Assistance: 1: +2 Total assist Ambulation/Gait: Patient Percentage: 80% Ambulation Distance (Feet): 4 Feet Assistive device: Rolling walker Ambulation/Gait Assistance Details: cueing for gaze stabilization, decreased speed and use of RW Gait Pattern: Shuffle;Wide base of support Gait velocity: decreased    Posture / Balance Static Sitting Balance Static Sitting - Balance Support: Bilateral upper extremity supported;Feet supported Static Sitting - Level of Assistance: 5: Stand by assistance Static Sitting - Comment/# of Minutes: 8 Static Standing Balance Static Standing - Balance Support: Bilateral upper extremity supported Static Standing - Level of Assistance: 4: Min assist Static Standing - Comment/# of Minutes: 10, 14, 120 sec respectively with RW and  cueing for gaze stabilization    Special needs/care consideration Oxygen 3 Liters nasal cannula Special Bed bariatric bed Trach Size decannulated trach 3/3 per Dr. Nicola Police ( Otolaryngolgy/ENT) Bowel mgmt: incont Bladder mgmt: condom cath Diabetic mgmt:ac and hs     Previous Home Environment Living Arrangements: Parent;Spouse/significant other Lives With: Spouse;Family (his 74 yo mother) Available Help at Discharge: Family Type of Home: Mobile home Home Layout: One level Home Access: Ramped entrance Bathroom Shower/Tub: Chiropodist: Standard Bathroom Accessibility: Yes How Accessible: Accessible via walker [place]: No  Discharge Living Setting Plans for Discharge Living Setting: Patient's home;Mobile Home Type of Home at Discharge: Mobile home Discharge Home Layout: One level Discharge Home Access: [place] entrance Discharge Bathroom Shower/Tub: Friona unit Discharge Bathroom  Toilet: Standard Discharge Bathroom Accessibility: Yes How Accessible: Accessible via walker Do you have any problems obtaining your medications?: No  Social/Family/Support Systems Patient Roles: Spouse Contact Information: Angie Fava, wife Anticipated Caregiver: wife and neighbor who can assist Anticipated Caregiver's Contact Information: see above Ability/Limitations of Caregiver: wife works, Mom 18 yo and can do supervision, neighbor can assist Caregiver Availability: Intermittent Discharge Plan Discussed with Primary Caregiver: Yes Is Caregiver In Agreement with Plan?: Yes Does Caregiver/Family have Issues with Lodging/Transportation while Pt is in Rehab?: Yes    Goals/Additional Needs Patient/Family Goal for Rehab: supervision PT, supervision to [place], ability to communicate needs SLP Expected length of stay:  ELOS 3 weeks Dietary Needs: Dysphagia 2 with thin liquids 10/22/12. Intermittent supervision Equipment Needs: yonker suction by self due to oral secretions Special Service Needs: communication difficulties due to large trach stoma that needs occlusion   Decrease burden of Care through IP rehab admission: n/a   Possible need for SNF placement upon discharge:n/a   Patient Condition:  Discussed with Dr. Naaman Plummer, Dr. Delaney Meigs and Dr. Benjamine Mola to clarify medical readiness to admit today. Patient decannulated trach on 10/21/12. O2 at 3 l  per Erma/ oral secretions productive and pt uses yonker suction independently. Thick tan sputum from trach stoma also suctioned. Dr. Benjamine Mola recommends antibiotics if felt needed. Dr. Benjamine Mola will follow pt on rehab . Dysphagia 2 diet with thin liquids today per SLP.  Preadmission Screen Completed By:  Cleatrice Burke, 10/22/2012 12:08 PM ______________________________________________________________________   Discussed status with Dr. Naaman Plummer on 10/22/12 at  1200 and received telephone approval for admission today.  Admission  Coordinator:  Cleatrice Burke, time 1200 Date 10/22/12.

## 2012-10-23 ENCOUNTER — Inpatient Hospital Stay (HOSPITAL_COMMUNITY): Payer: BC Managed Care – PPO | Admitting: Speech Pathology

## 2012-10-23 ENCOUNTER — Inpatient Hospital Stay (HOSPITAL_COMMUNITY): Payer: BC Managed Care – PPO

## 2012-10-23 ENCOUNTER — Inpatient Hospital Stay (HOSPITAL_COMMUNITY): Payer: BC Managed Care – PPO | Admitting: Occupational Therapy

## 2012-10-23 DIAGNOSIS — D333 Benign neoplasm of cranial nerves: Secondary | ICD-10-CM

## 2012-10-23 DIAGNOSIS — G519 Disorder of facial nerve, unspecified: Secondary | ICD-10-CM

## 2012-10-23 DIAGNOSIS — R609 Edema, unspecified: Secondary | ICD-10-CM

## 2012-10-23 DIAGNOSIS — R269 Unspecified abnormalities of gait and mobility: Secondary | ICD-10-CM

## 2012-10-23 DIAGNOSIS — R5381 Other malaise: Secondary | ICD-10-CM

## 2012-10-23 DIAGNOSIS — E669 Obesity, unspecified: Secondary | ICD-10-CM

## 2012-10-23 DIAGNOSIS — J209 Acute bronchitis, unspecified: Secondary | ICD-10-CM

## 2012-10-23 LAB — URINALYSIS, ROUTINE W REFLEX MICROSCOPIC
Bilirubin Urine: NEGATIVE
Glucose, UA: NEGATIVE mg/dL
Specific Gravity, Urine: 1.026 (ref 1.005–1.030)
Urobilinogen, UA: 0.2 mg/dL (ref 0.0–1.0)

## 2012-10-23 LAB — CBC WITH DIFFERENTIAL/PLATELET
Basophils Absolute: 0 10*3/uL (ref 0.0–0.1)
Lymphocytes Relative: 9 % — ABNORMAL LOW (ref 12–46)
Neutro Abs: 17.5 10*3/uL — ABNORMAL HIGH (ref 1.7–7.7)
Platelets: 374 10*3/uL (ref 150–400)
RDW: 13.5 % (ref 11.5–15.5)
WBC: 23.1 10*3/uL — ABNORMAL HIGH (ref 4.0–10.5)

## 2012-10-23 LAB — COMPREHENSIVE METABOLIC PANEL
ALT: 61 U/L — ABNORMAL HIGH (ref 0–53)
AST: 35 U/L (ref 0–37)
CO2: 29 mEq/L (ref 19–32)
Calcium: 10 mg/dL (ref 8.4–10.5)
Chloride: 95 mEq/L — ABNORMAL LOW (ref 96–112)
GFR calc non Af Amer: >90 mL/min (ref >90)
Sodium: 135 mEq/L (ref 135–145)
Total Bilirubin: 1.3 mg/dL — ABNORMAL HIGH (ref 0.3–1.2)

## 2012-10-23 LAB — GLUCOSE, CAPILLARY
Glucose-Capillary: 129 mg/dL — ABNORMAL HIGH (ref 70–99)
Glucose-Capillary: 142 mg/dL — ABNORMAL HIGH (ref 70–99)
Glucose-Capillary: 157 mg/dL — ABNORMAL HIGH (ref 70–99)
Glucose-Capillary: 140 mg/dL — ABNORMAL HIGH (ref 70–99)

## 2012-10-23 LAB — URINE MICROSCOPIC-ADD ON

## 2012-10-23 MED ORDER — CIPROFLOXACIN HCL 500 MG PO TABS
500.0000 mg | ORAL_TABLET | Freq: Two times a day (BID) | ORAL | Status: DC
  Administered 2012-10-23 – 2012-10-27 (×10): 500 mg via ORAL
  Filled 2012-10-23 (×13): qty 1

## 2012-10-23 MED ORDER — PRO-STAT SUGAR FREE PO LIQD
30.0000 mL | Freq: Three times a day (TID) | ORAL | Status: DC
  Administered 2012-10-23 – 2012-11-12 (×43): 30 mL via ORAL
  Filled 2012-10-23 (×63): qty 30

## 2012-10-23 MED ORDER — ADULT MULTIVITAMIN W/MINERALS CH
1.0000 | ORAL_TABLET | Freq: Every day | ORAL | Status: DC
  Administered 2012-10-23 – 2012-11-12 (×21): 1 via ORAL
  Filled 2012-10-23 (×22): qty 1

## 2012-10-23 MED ORDER — ENSURE PUDDING PO PUDG
1.0000 | Freq: Three times a day (TID) | ORAL | Status: DC
  Administered 2012-10-23 – 2012-11-12 (×57): 1 via ORAL

## 2012-10-23 MED ORDER — ENOXAPARIN SODIUM 40 MG/0.4ML ~~LOC~~ SOLN
40.0000 mg | SUBCUTANEOUS | Status: DC
  Administered 2012-10-23 – 2012-11-12 (×21): 40 mg via SUBCUTANEOUS
  Filled 2012-10-23 (×24): qty 0.4

## 2012-10-23 NOTE — Progress Notes (Signed)
Pt returned from radiology (dopplers).  O2 nasal cannula displaced, not in nares.  Sat 82%; nasal cannula placed, Sat up to 90% on 3 liters;  up to 93% sustained, at rest.  Copious thick yellow/tan secretions continuous from gaping trach site.  Dressing changed x 5 this shift. Surrounding skin red,moist.  [place] straps obtained in attempt to alleviate tape irritation. Wound at buttocks, left thigh..see wound FS. Skin tear rt. forearm with Tegaderm, changed.    Bil. lower exts, knee down, bil. feet, cool to touch, rt>lt. ; discolored; pulses palpable, weak.  Occasional c/o headache relieved with 1 Vicodin... X2 today.  Appetite fair, taking supplements as ordered; drools, pockets. Frequent checks, suction at [facility].

## 2012-10-23 NOTE — Progress Notes (Signed)
Occupational Therapy Note  Patient Details  Name: [name] MRN: [id number] Date of Birth: 03/28/1961 Today's Date: 10/23/2012  Time: 1345-1415 Pt denies pain Individual Therapy  Pt resting in bed upon arrival and required min encouragement to sit EOB.  Pt expressed frustration when attempting to verbally communicate.  Focus on bed mobility, sit<>stand, side stepping with RW, safety awareness, and activity tolerance.  Pt performed sit<>stand with steady A and verbal cues for safety.   Leotis Shames [facility] 10/23/2012, 2:39 PM

## 2012-10-23 NOTE — Progress Notes (Addendum)
INITIAL NUTRITION ASSESSMENT  DOCUMENTATION CODES Per approved criteria  -Morbid Obesity   INTERVENTION: 1. Change Ensure Complete to Ensure Pudding 2. Add 30 ml Prostat po TID, each supplement provides 100 kcal and 15 grams protein. 3. MVI daily 4. RD to continue to follow nutrition care plan  NUTRITION DIAGNOSIS: Increased nutrient needs related to wound healing as evidenced by estimated needs.   Goal: Intake to meet >90% of estimated nutrition needs.  Monitor:  weight trends, lab trends, I/O's, PO intake, supplement tolerance  Reason for Assessment: Health History  52 y.o. male  Admitting Dx: acoustic neuroma with dizziness, loss of hearing on right and weakness bilateral hands  ASSESSMENT: Admitted with complains of R-sided hearing loss x 1 year and 64-month history of difficulty using hands, hoarseness, urinary frequency, and dizziness with balance problems requiring use of wheelchair. 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 He was admitted on 2/21 for acoustic neroma resection. Post-op required slow vent wean but tolerated ATC and was de-cannulated on 3/03. Oral secretions have decreased and FEES done revealed delay in swallow with ?aspiration/penetration with straws. Was started on D3, thin liquids. He continues to have right facial palsy, dizziness with activity and inability to phonate.   Pt was followed by RD staff during acute hospitalization. Pt was maintained on enteral nutrition during vent and weaning process. Currently ordered for Ensure Complete po BID. Discussed oral intake with RN. She notes that most of food spills out of pt's mouth when eating. Recommend changing Ensure Complete to Ensure Pudding.  Pt is at high risk for malnutrition given poor oral intake.  Height: Ht Readings from Last 1 Encounters:  10/12/12 5\' 11"  (1.803 m)    Weight: Wt Readings from Last 1 Encounters:  10/22/12 311 lb 15.2 oz (141.5 kg)  Pt with  extremely variable wt fluctuations since admission to acute hospitalization. Admit wt on admission was 361 lb and pt with significant edema. Note that pt was permissively underfed during his critical illness and was without nutrition during the first 4 days of his admission.  Ideal Body Weight: 78.2 kg  % Ideal Body Weight: 181%  Wt Readings from Last 10 Encounters:  10/22/12 311 lb 15.2 oz (141.5 kg)  10/21/12 330 lb 0.5 oz (149.7 kg)  10/21/12 330 lb 0.5 oz (149.7 kg)  10/04/12 361 lb 12.8 oz (164.111 kg)  09/09/12 425 lb (192.779 kg)    Usual Body Weight: n/a  % Usual Body Weight: n/a  BMI:  Body mass index is 43.53 kg/(m^2). Obese Class III  Estimated Nutritional Needs: Kcal: 2300 - 2500 Protein: 115 - 140 grams Fluid: 2.3 - 2.5 liters  Skin: DTI to L and R buttocks  Diet Order: Dysphagia 2; thin liquids  EDUCATION NEEDS: -No education needs identified at this time  No intake or output data in the 24 hours ending 10/23/12 1101  Last BM: 3/3  Labs:   Recent Labs Lab 10/17/12 0600 10/18/12 0535 10/22/12 0617  NA 144 145 143  K 4.4 4.2 3.9  CL 101 105 102  CO2 34* 35* 34*  BUN 43* 43* 36*  CREATININE 0.75 0.70 0.80  CALCIUM 10.1 9.9 10.0  MG 2.2  --   --   PHOS 3.0  --   --   GLUCOSE 170* 210* 136*    CBG (last 3)   Recent Labs  10/22/12 2214 10/23/12 0718 10/23/12 0828  GLUCAP 105* 129* 142*    Scheduled Meds: . albuterol  2.5 mg Nebulization Q6H  . antiseptic oral rinse  15 mL Mouth Rinse QID  . artificial tears   Right Eye Q8H  . atenolol  50 mg Oral BID  . chlorhexidine  15 mL Mouth Rinse BID  . ciprofloxacin  500 mg Oral BID  . enoxaparin (LOVENOX) injection  40 mg Subcutaneous Q24H  . feeding supplement  237 mL Oral BID BM  . hydrocerin   Topical BID  . insulin aspart  0-15 Units Subcutaneous TID WC  . insulin aspart  0-5 Units Subcutaneous QHS  . insulin aspart  3 Units Subcutaneous TID WC  . saccharomyces boulardii  250 mg Oral  BID  . senna  1 tablet Oral BID    Continuous Infusions:   Past Medical History  Diagnosis Date  . CAD (coronary artery disease)     Cath 09, 99% LAD  . Cardiomyopathy, ischemic     Ischemic  . Obesity   . Fibromyalgia   . Shortness of breath   . CHF (congestive heart failure)   . Hypertension     dr Percival Spanish  . Cyst near coccyx   . Diabetes mellitus without complication     borderline  . Cyst near tailbone   . Kidney stones   . Myocardial infarction   . Headache     with lights    Past Surgical History  Procedure Laterality Date  . Coronary artery bypass graft      LIMA to LAD 2009  . Umbilical hernia repair    . Anal fistulectomy    . Retrosigmoid craniectomy for tumor resection Right 10/11/2012    Procedure: RETROSIGMOID CRANIECTOMY FOR TUMOR RESECTION;  Surgeon: [name], MD;  Location: [place] [facility];  Service: Neurosurgery;  Laterality: Right;  Craniotomy for acoustic neuroma  . Tracheostomy tube placement N/A 10/11/2012    Procedure: TRACHEOSTOMY;  Surgeon: Ascencion Dike, MD;  Location: [place] [facility];  Service: ENT;  Laterality: N/A;  . Acoustic neuroma resection N/A 10/11/2012    Procedure: ACOUSTIC NEUROMA RESECTION;  Surgeon: Ascencion Dike, MD;  Location: [place] [facility];  Service: ENT;  Laterality: N/A;    Inda Coke MS, RD, LDN Pager: [phone] After-hours pager: [phone]

## 2012-10-23 NOTE — Evaluation (Signed)
Speech Language Pathology Assessment and Plan  Patient Details  Name: [name] MRN: [id number] Date of Birth: 1960/10/15  SLP Diagnosis: Dysarthria;Dysphagia;Cognitive Impairments;Voice disorder  Rehab Potential: Good ELOS: 2-2.5 weeks   Today's Date: 10/23/2012 Time: [id number] Time Calculation (min): 45 min  Skilled Therapeutic Intervention: Administered BSE and cognitive-linguistic evaluation. Please see below for details.   Problem List:  Patient Active Problem List  Diagnosis  . CAD (coronary artery disease) of artery bypass graft  . Failed CABG (coronary artery bypass graft)  . Preop cardiovascular exam  . Morbid obesity  . Tobacco abuse  . Acoustic neuroma  . Status post craniotomy  . Respiratory failure, post-operative  . Tracheostomy status   Past Medical History:  Past Medical History  Diagnosis Date  . CAD (coronary artery disease)     Cath 09, 99% LAD  . Cardiomyopathy, ischemic     Ischemic  . Obesity   . Fibromyalgia   . Shortness of breath   . CHF (congestive heart failure)   . Hypertension     dr Percival Spanish  . Cyst near coccyx   . Diabetes mellitus without complication     borderline  . Cyst near tailbone   . Kidney stones   . Myocardial infarction   . Headache     with lights   Past Surgical History:  Past Surgical History  Procedure Laterality Date  . Coronary artery bypass graft      LIMA to LAD 2009  . Umbilical hernia repair    . Anal fistulectomy    . Retrosigmoid craniectomy for tumor resection Right 10/11/2012    Procedure: RETROSIGMOID CRANIECTOMY FOR TUMOR RESECTION;  Surgeon: [name], MD;  Location: [place] [facility];  Service: Neurosurgery;  Laterality: Right;  Craniotomy for acoustic neuroma  . Tracheostomy tube placement N/A 10/11/2012    Procedure: TRACHEOSTOMY;  Surgeon: Ascencion Dike, MD;  Location: [facility];  Service: ENT;  Laterality: N/A;  . Acoustic neuroma resection N/A 10/11/2012    Procedure: ACOUSTIC NEUROMA  RESECTION;  Surgeon: Ascencion Dike, MD;  Location: [facility];  Service: ENT;  Laterality: N/A;    Assessment / Plan / Recommendation Clinical Impression  Pt is a 52 y.o. male with history of CAD, ICM, morbid obesity, who has had right sided hearing loss for the past year and two month history of difficulty using hands, hoarseness, urinary frequency as well as dizziness with balance problems requiring use of wheelchair. 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 He was admitted on 10/11/12 for acoustic neuroma resection by Dr. Christella Noa and tracheostomy by Dr. Benjamine Mola. Post op required slow vent wean but tolerated ATC and was de-cannulated on 03/03. Oral secretions have decreased and FEES done revealed delay in swallow with questionable aspiration/penetration with straws. Recommend to initiate Dys. 3 textures with thin liquids which was eventually downgraded to Dys. 2 textures. He continues to have right facial palsy, dizziness with activity and dysphonia. He needs cues for safety and gaze stabilization. Therapy team recommended CIR level therapies for progression. Pt admitted 10/22/12 and presents with moderate oral dysphagia characterized by impaired facial and labial weakness and ROM, decreased AP transit and decreased bolus manipulation and mastication resulting in moderate right buccal pocketing and anterior spillage. Pt also presented with a mild pharyngeal dysphagia per FEES performed 10/21/12 with thin liquids via straw. Pt demonstrates a moderate dysarthria due to oral-motor weakness and decreased ROM and dysphonia due to large stoma with  decreased breath support which impacts pt's verbal expression and overall functional communication.  Pt also demonstrates moderate cognitive impairments characterized by decreased sustained attention, decreased working memory of newly learned information, decreased functional problem solving, decreased emergent awareness and impulsivity. Pt  would benefit from skilled SLP intervention to maximize swallowing function with least restrictive diet, functional communication and cognitive recovery. Anticipate pt will need 24 hour supervision at discharge and follow-up skilled SLP intervention.     SLP Assessment  Patient will need skilled Speech Lanaguage Pathology Services during CIR admission    Recommendations  Diet Recommendations: Dysphagia 2 (Fine chop);Thin liquid Liquid Administration via: Cup;No straw Medication Administration: Whole meds with puree Supervision: Patient able to self feed;Full supervision/cueing for compensatory strategies Compensations: Slow rate;Small sips/bites;Check for pocketing Postural Changes and/or Swallow Maneuvers: Seated upright 90 degrees;Upright 30-60 min after meal Oral Care Recommendations: Oral care QID Patient destination: Home Follow up Recommendations: Home Health SLP;24 hour supervision/assistance Equipment Recommended: None recommended by SLP    SLP Frequency 5 out of 7 days   SLP Treatment/Interventions Cueing hierarchy;Cognitive remediation/compensation;Environmental controls;Functional tasks;Internal/external aids;Speech/Language facilitation;Therapeutic Activities;Patient/family education;Neuromuscular electrical stimulation;Therapeutic Exercise;Dysphagia/aspiration precaution training;Oral motor exercises    Pain Reports a headache, RN notified and reported pt was premedicated   Short Term Goals: Week 1: SLP Short Term Goal 1 (Week 1): Pt will apply pressure to stoma to increase vocal intensity at the word level with Mod A verbal and visual cues.  SLP Short Term Goal 2 (Week 1): Pt will utilize swallowing compensatory strategies to minimize overt s/s of aspiration with Mod A verbal and tactile cues.  SLP Short Term Goal 3 (Week 1): Pt will demonstrate sustained attention to functional tasks for 15 minutes with Mod A verbal cues for redirection SLP Short Term Goal 4 (Week 1): Pt  will recall newly learned information with Mod A question and visual cues.   See FIM for current functional status Refer to Care Plan for Long Term Goals  Recommendations for other services: Neuropsych  Discharge Criteria: Patient will be discharged from SLP if patient refuses treatment 3 consecutive times without medical reason, if treatment goals not met, if there is a change in medical status, if patient makes no progress towards goals or if patient is discharged from hospital.  The above assessment, treatment plan, treatment alternatives and goals were discussed and mutually agreed upon: by patient  PAYNE, COURTNEY 10/23/2012, 4:49 PM

## 2012-10-23 NOTE — Progress Notes (Deleted)
INITIAL NUTRITION ASSESSMENT  DOCUMENTATION CODES Per approved criteria  -Morbid Obesity   INTERVENTION: 1. Change Ensure Complete to Ensure Pudding 2. Add 30 ml Prostat po TID, each supplement provides 100 kcal and 15 grams protein. 3. MVI daily 4. RD to continue to follow nutrition care plan  NUTRITION DIAGNOSIS: Increased nutrient needs related to wound healing as evidenced by estimated needs.   Goal: Intake to meet >90% of estimated nutrition needs.  Monitor:  weight trends, lab trends, I/O's, PO intake, supplement tolerance  Reason for Assessment: Health History  52 y.o. male  Admitting Dx: acoustic neuroma with dizziness, loss of hearing on right and weakness bilateral hands  ASSESSMENT: Admitted with complains of R-sided hearing loss x 1 year and 25-month history of difficulty using hands, hoarseness, urinary frequency, and dizziness with balance problems requiring use of wheelchair. 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 He was admitted on 2/21 for acoustic neroma resection. Post-op required slow vent wean but tolerated ATC and was de-cannulated on 3/03. Oral secretions have decreased and FEES done revealed delay in swallow with ?aspiration/penetration with straws. Was started on D3, thin liquids. He continues to have right facial palsy, dizziness with activity and inability to phonate.   Pt was followed by RD staff during acute hospitalization. Pt was maintained on enteral nutrition during vent and weaning process. Currently ordered for Ensure Complete po BID. Discussed oral intake with RN. She notes that most of food spills out of pt's mouth when eating. Recommend changing Ensure Complete to Ensure Pudding.  Pt is at high risk for malnutrition given poor oral intake.  Height: Ht Readings from Last 1 Encounters:  10/12/12 5\' 11"  (1.803 m)    Weight: Wt Readings from Last 1 Encounters:  10/22/12 311 lb 15.2 oz (141.5 kg)  Pt with  extremely variable wt fluctuations since admission to acute hospitalization. Admit wt on admission was 361 lb and pt with significant edema. Note that pt was permissively underfed during his critical illness and was without nutrition during the first 4 days of his admission.  Ideal Body Weight: 78.2 kg  % Ideal Body Weight: 181%  Wt Readings from Last 10 Encounters:  10/22/12 311 lb 15.2 oz (141.5 kg)  10/21/12 330 lb 0.5 oz (149.7 kg)  10/21/12 330 lb 0.5 oz (149.7 kg)  10/04/12 361 lb 12.8 oz (164.111 kg)  09/09/12 425 lb (192.779 kg)    Usual Body Weight: n/a  % Usual Body Weight: n/a  BMI:  Body mass index is 43.53 kg/(m^2). Obese Class III  Estimated Nutritional Needs: Kcal: 2300 - 2500 Protein: 115 - 140 grams Fluid: 2.3 - 2.5 liters  Skin: DTI to L and R buttocks  Diet Order: Dysphagia 2; thin liquids  EDUCATION NEEDS: -No education needs identified at this time  No intake or output data in the 24 hours ending 10/23/12 1143  Last BM: 3/3  Labs:   Recent Labs Lab 10/17/12 0600 10/18/12 0535 10/22/12 0617  NA 144 145 143  K 4.4 4.2 3.9  CL 101 105 102  CO2 34* 35* 34*  BUN 43* 43* 36*  CREATININE 0.75 0.70 0.80  CALCIUM 10.1 9.9 10.0  MG 2.2  --   --   PHOS 3.0  --   --   GLUCOSE 170* 210* 136*    CBG (last 3)   Recent Labs  10/23/12 0718 10/23/12 0828 10/23/12 1122  GLUCAP 129* 142* 157*    Scheduled Meds: . albuterol  2.5 mg Nebulization Q6H  . antiseptic oral rinse  15 mL Mouth Rinse QID  . artificial tears   Right Eye Q8H  . atenolol  50 mg Oral BID  . chlorhexidine  15 mL Mouth Rinse BID  . ciprofloxacin  500 mg Oral BID  . enoxaparin (LOVENOX) injection  40 mg Subcutaneous Q24H  . feeding supplement  1 Container Oral TID BM  . feeding supplement  30 mL Oral TID WC  . hydrocerin   Topical BID  . insulin aspart  0-15 Units Subcutaneous TID WC  . insulin aspart  0-5 Units Subcutaneous QHS  . insulin aspart  3 Units Subcutaneous  TID WC  . multivitamin with minerals  1 tablet Oral Daily  . saccharomyces boulardii  250 mg Oral BID  . senna  1 tablet Oral BID    Continuous Infusions:   Past Medical History  Diagnosis Date  . CAD (coronary artery disease)     Cath 09, 99% LAD  . Cardiomyopathy, ischemic     Ischemic  . Obesity   . Fibromyalgia   . Shortness of breath   . CHF (congestive heart failure)   . Hypertension     dr Percival Spanish  . Cyst near coccyx   . Diabetes mellitus without complication     borderline  . Cyst near tailbone   . Kidney stones   . Myocardial infarction   . Headache     with lights    Past Surgical History  Procedure Laterality Date  . Coronary artery bypass graft      LIMA to LAD 2009  . Umbilical hernia repair    . Anal fistulectomy    . Retrosigmoid craniectomy for tumor resection Right 10/11/2012    Procedure: RETROSIGMOID CRANIECTOMY FOR TUMOR RESECTION;  Surgeon: [name], MD;  Location: [place] [facility];  Service: Neurosurgery;  Laterality: Right;  Craniotomy for acoustic neuroma  . Tracheostomy tube placement N/A 10/11/2012    Procedure: TRACHEOSTOMY;  Surgeon: Ascencion Dike, MD;  Location: [place] [facility];  Service: ENT;  Laterality: N/A;  . Acoustic neuroma resection N/A 10/11/2012    Procedure: ACOUSTIC NEUROMA RESECTION;  Surgeon: Ascencion Dike, MD;  Location: [place] [facility];  Service: ENT;  Laterality: N/A;    Inda Coke MS, RD, LDN Pager: [phone] After-hours pager: [phone]

## 2012-10-23 NOTE — Progress Notes (Signed)
Subjective: Pt awake and responsive.  Still has significant trach stoma secretion.  Objective: Vital signs in last 24 hours: Temp:  [97.8 F (36.6 C)-98.6 F (37 C)] 98.1 F (36.7 C) (03/05 0453) Pulse Rate:  [56-107] 56 (03/05 0453) Resp:  [15-18] 17 (03/05 0453) BP: (116-150)/(81-91) 142/91 mmHg (03/05 0453) SpO2:  [91 %-96 %] 96 % (03/05 0453) Weight:  [141.5 kg (311 lb 15.2 oz)] 141.5 kg (311 lb 15.2 oz) (03/04 1947)  Awake and responsive.  Able to communicate verbally and via gestures.  Right facial palsy noted. Incomplete eye closure.  Trach stoma still open. Mucoid secretion noted.   Recent Labs  10/22/12 0617  WBC 20.0*  HGB 15.4  HCT 45.9  PLT 430*    Recent Labs  10/22/12 0617  NA 143  K 3.9  CL 102  CO2 34*  GLUCOSE 136*  BUN 36*  CREATININE 0.80  CALCIUM 10.0    Medications:  I have reviewed the patient's current medications. Scheduled: . albuterol  2.5 mg Nebulization Q6H  . antiseptic oral rinse  15 mL Mouth Rinse QID  . artificial tears   Right Eye Q8H  . atenolol  50 mg Oral BID  . chlorhexidine  15 mL Mouth Rinse BID  . ciprofloxacin  500 mg Oral BID  . enoxaparin (LOVENOX) injection  40 mg Subcutaneous Q24H  . feeding supplement  237 mL Oral BID BM  . hydrocerin   Topical BID  . insulin aspart  0-15 Units Subcutaneous TID WC  . insulin aspart  0-5 Units Subcutaneous QHS  . insulin aspart  3 Units Subcutaneous TID WC  . saccharomyces boulardii  250 mg Oral BID  . senna  1 tablet Oral BID   [id number], albuterol, alum & mag hydroxide-simeth, bisacodyl, diphenhydrAMINE, guaiFENesin-dextromethorphan, HYDROcodone-acetaminophen, naLOXone (NARCAN)  injection, prochlorperazine, prochlorperazine, prochlorperazine, traMADol, traZODone  Assessment/Plan: POD #12 s/p right AN resection. Postop CT shows good tumor resection and decompression. Right facial palsy noted. Will need lacrilube at night to prevent corneal drying. Will consult  oculoplastic for right eyelid gold weight implant. Trach removed. Stoma healing. Inpatient rehab. Agree with Cipro coverage of his pulm secretion/possible pneumonia.  Will follow.    LOS: 1 day   TEOH,SUI W 10/23/2012, 9:10 AM

## 2012-10-23 NOTE — Progress Notes (Signed)
Patient information reviewed and entered into eRehab system by Marie Noel, RN, CRRN, PPS Coordinator.  Information including medical coding and functional independence measure will be reviewed and updated through discharge.    

## 2012-10-23 NOTE — Plan of Care (Signed)
Overall Plan of Care [facility]) Patient Details Name: ARDEL Arellano MRN: [id number] DOB: November 02, 1960  Diagnosis:  Acoustic neuroma s/p resection with significant vestibular deficits, facial nerve injury  Co-morbidities: morbid obesity, CAD, DM, tracheobronchitis, wound care, htn  Functional Problem List  Patient demonstrates impairments in the following areas: Balance, Bladder, Bowel, Cognition, Edema, Endurance, Medication Management, Motor, Pain, Perception, Safety, Sensory , Skin Integrity and Vision  Basic ADL's: eating, grooming, bathing, dressing and toileting Advanced ADL's: simple meal preparation  Transfers:  bed mobility, bed to chair, toilet, tub/shower, car and furniture Locomotion:  ambulation, wheelchair mobility and stairs  Additional Impairments:  Functional use of upper extremity, Swallowing, Communication  expression, Social Cognition   social interaction, problem solving, memory, attention and awareness, Leisure Awareness and Discharge Disposition  Anticipated Outcomes Item Anticipated Outcome  Eating/Swallowing  Min A with least restrictive diet  Basic self-care  Setup   Tolieting  Mod I   Bowel/Bladder  Mod assist  Transfers  Mod I basic transfers; min A car  Locomotion  S gait household distances; mod I w/c on unit  Communication  Min A  Cognition  Min A  Pain  Less than or equal to 3.  Safety/Judgment    Other     Therapy Plan: PT Intensity: Minimum of 1-2 x/day ,45 to 90 minutes PT Frequency: 5 out of 7 days PT Duration Estimated Length of Stay: 2-2.5 weeks OT Intensity: Minimum of 1-2 x/day, 45 to 90 minutes OT Duration/Estimated Length of Stay: 2-3 weels SLP Intensity: Minumum of 1-2 x/day, 30 to 90 minutes SLP Frequency: 5 out of 7 days    Team Interventions: Item RN PT OT SLP SW TR Other  Self Care/Advanced ADL Retraining  x x      Neuromuscular Re-Education  x x      Therapeutic Activities  x x x     UE/LE Strength Training/ROM  x  x      UE/LE Coordination Activities  x x      Visual/Perceptual Remediation/Compensation  x x x     DME/Adaptive Equipment Instruction  x x      Therapeutic Exercise  x x x     Balance/Vestibular Training  x x      Patient/Family Education x x x x     Cognitive Remediation/Compensation   x x     Functional Mobility Training  x x      Ambulation/Gait Training  x       Stair Training  x       Wheelchair Propulsion/Positioning  x       Functional IT sales professional Reintegration  x x      Dysphagia/Aspiration Development worker, community    x     Speech/Language Facilitation    x     Bladder Management x        Bowel Management x        Disease Management/Prevention x x       Pain Management x x x      Medication Management x        Skin Care/Wound Management x x x      Splinting/Orthotics  x x      Discharge Planning  x x x     Psychosocial Support  x x x  Team Discharge Planning: Destination: PT-Home ,OT- Home , SLP-Home Projected Follow-up: PT-Home health PT, OT-  Home health OT, SLP-Home Health SLP;24 hour supervision/assistance Projected Equipment Needs: PT- , OT-  , SLP-None recommended by SLP Patient/family involved in discharge planning: PT- Patient,  OT-Patient, SLP-Patient  MD ELOS: 2-3 weeks Medical Rehab Prognosis:  Good Assessment: The patient has been admitted for CIR therapies. The team will be addressing, functional mobility, strength, stamina, balance, safety, adaptive techniques/equipment, self-care, bowel and bladder mgt, patient and caregiver education, NMR, speech, communication, swallowing, cognition, wound care, behavior mod. Goals have been set at mod I for basic mobility and self-care and min assist for cognition, communication..     See Team Conference Notes for weekly updates to the plan of care

## 2012-10-23 NOTE — Progress Notes (Signed)
Subjective/Complaints: No specific complaints. Anxious this am. Concerned about secretions through trach stoma. A 12 point review of systems has been performed and if not noted above is otherwise negative.   Objective: Vital Signs: Blood pressure 142/91, pulse 56, temperature 98.1 F (36.7 C), temperature source Oral, resp. rate 17, weight 141.5 kg (311 lb 15.2 oz), SpO2 96.00%. No results found.  Recent Labs  10/22/12 0617  WBC 20.0*  HGB 15.4  HCT 45.9  PLT 430*    Recent Labs  10/22/12 0617  NA 143  K 3.9  CL 102  GLUCOSE 136*  BUN 36*  CREATININE 0.80  CALCIUM 10.0   CBG (last 3)   Recent Labs  10/22/12 1720 10/22/12 2214 10/23/12 0718  GLUCAP 123* 105* 129*    Wt Readings from Last 3 Encounters:  10/22/12 141.5 kg (311 lb 15.2 oz)  10/21/12 149.7 kg (330 lb 0.5 oz)  10/21/12 149.7 kg (330 lb 0.5 oz)    Physical Exam:  Constitutional: He is oriented to person, place, and time. He appears well-developed and well-nourished.  Morbidly obese male with right facial paresis and thick yellow drainage occasionally spurting out of tracheal stoma.  HENT:  Head: Normocephalic and atraumatic.  Mouth/Throat: Abnormal dentition. Dental caries present. Oropharyngeal exudate present.  Neck: Normal range of motion.  Large tracheal stoma with thick, yellow, purulent appearing drainage. Minimal phonation. Communicates with whisper and hand signals.  Cardiovascular: Normal rate and regular rhythm.  Pulmonary/Chest: Effort normal. He has decreased breath sounds in the right lower field and the left lower field. He has rhonchi.  Abdominal: Soft. Bowel sounds are normal. He exhibits no distension. There is no tenderness.  Musculoskeletal: He exhibits edema (1+ pedal edema.).  Bilateral fore and mid foot as well as toes are purple and cold to touch ("like it that way") +pedal pulses felt.  Neurological: He is alert and oriented to person, place, and time.  Dysphonic. Right  facial droop, frontal droop, unable to close right eyelid. Impulsive and impatient. Able to follow basic commands without difficulty. BUE with weakness and decrease in fine motor movements right greater than left. Strength grossly 3 to 3+ UE, 2+ to 3, LE's.  Skin: Skin is warm and dry.  Deep tissue injury of buttocks. Left lateral thigh with quarter size stage 2 ulcer. Stasis changes bilateral shins. Heels with dry skin.    Assessment/Plan: 1. 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 Physiatrist is providing close team supervision and 24 hour management of active medical problems listed below. Physiatrist and rehab team continue to assess barriers to discharge/monitor patient progress toward functional and medical goals. FIM:                   Comprehension Comprehension Mode: Auditory (pt hard of hearing right ear) Comprehension: 6-Follows complex conversation/direction: With extra time/assistive device  Expression Expression Mode: Verbal Expression: 6-Expresses complex ideas: With extra time/assistive device  Social Interaction Social Interaction: 6-Interacts appropriately with others with medication or extra time (anti-anxiety, antidepressant).  Problem Solving Problem Solving: 5-Solves complex 90% of the time/cues < 10% of the time  Memory Memory: 5-Recognizes or recalls 90% of the time/requires cueing < 10% of the time  Medical Problem List and Plan:  1. DVT Prophylaxis/Anticoagulation: Mechanical: Antiembolism stockings, knee (TED hose) Bilateral lower extremities  Sequential compression devices, below knee Bilateral lower  extremities.  Add lovenox.  Will check dopplers.  2. Pain Management: N/A.   prn ultram.  3. Mood: Provide ego support and redirection. LCSW to follow  for evaluation.  4. Neuropsych: This patient is capable of making decisions on his/her own behalf.  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 Continue Lacrilube tid with patching at nights to prevent corneal abrasion. Eye patch at night. 6. CAD: continue atenolol bid. Patient has declined statin per reports.  7. DM type 2- diet controlled: Will monitor BS with AC/HS CBG checks. Use SSI for elevated BS. Currently 120-200 range likely due to stress and stress dose steroids. Follow for pattern 8. Chronic LE edema: Low salt diet. Added TEDs.  9. Leukocytosis: Will check urine culture. cipro for serratia tracheobronchitis. Increase to 500mg  bid. Begin packing of stoma once secretions/mucous decreases 8. HTN: Monitor with bid checks. Continue tenormin bid. Blood pressures have been variable and will adjust as needed.  9. Acute renal insufficiency: Due to dehydration? Encourage po fluid intake  LOS (Days) 1 A FACE TO FACE EVALUATION WAS PERFORMED  SWARTZ,ZACHARY T 10/23/2012 7:44 AM

## 2012-10-23 NOTE — Evaluation (Signed)
Physical Therapy Assessment and Plan  Patient Details  Name: [name] MRN: [id number] Date of Birth: 04/16/1961  PT Diagnosis: Abnormality of gait, Difficulty walking, Dizziness, Edema, Muscle weakness and Pain in R eye/head Rehab Potential: Good ELOS: 2-2.5 weeks   Today's Date: 10/23/2012 Time: [id number] Time Calculation (min): 60 min  Problem List:  Patient Active Problem List  Diagnosis  . CAD (coronary artery disease) of artery bypass graft  . Failed CABG (coronary artery bypass graft)  . Preop cardiovascular exam  . Morbid obesity  . Tobacco abuse  . Acoustic neuroma  . Status post craniotomy  . Respiratory failure, post-operative  . Tracheostomy status    Past Medical History:  Past Medical History  Diagnosis Date  . CAD (coronary artery disease)     Cath 09, 99% LAD  . Cardiomyopathy, ischemic     Ischemic  . Obesity   . Fibromyalgia   . Shortness of breath   . CHF (congestive heart failure)   . Hypertension     dr Percival Spanish  . Cyst near coccyx   . Diabetes mellitus without complication     borderline  . Cyst near tailbone   . Kidney stones   . Myocardial infarction   . Headache     with lights   Past Surgical History:  Past Surgical History  Procedure Laterality Date  . Coronary artery bypass graft      LIMA to LAD 2009  . Umbilical hernia repair    . Anal fistulectomy    . Retrosigmoid craniectomy for tumor resection Right 10/11/2012    Procedure: RETROSIGMOID CRANIECTOMY FOR TUMOR RESECTION;  Surgeon: [name], MD;  Location: [facility];  Service: Neurosurgery;  Laterality: Right;  Craniotomy for acoustic neuroma  . Tracheostomy tube placement N/A 10/11/2012    Procedure: TRACHEOSTOMY;  Surgeon: Ascencion Dike, MD;  Location: [facility];  Service: ENT;  Laterality: N/A;  . Acoustic neuroma resection N/A 10/11/2012    Procedure: ACOUSTIC NEUROMA RESECTION;  Surgeon: Ascencion Dike, MD;  Location: [facility];  Service: ENT;  Laterality:  N/A;    Assessment & Plan Clinical Impression: Patient is a 52 y.o. 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 He was admitted on 10/11/12 for acoustic neroma resection by Dr. Christella Noa and tracheostomy by Dr. Benjamine Mola. Post op required slow vent wean but tolerated ATC and was de-cannulated on 03/03. Oral secretions have decreased and FEES done revealed delay in swallow with ?aspiration/penetration with straws. Was started on D3, thin liquids. He continues to have right facial palsy, dizziness with activity and inability to phonate. He needs cues for safety and gaze stabilization. Patient transferred to CIR on 10/22/2012 .   Patient currently requires mod with mobility secondary to muscle weakness, decreased cardiorespiratoy endurance, impaired vision, dizziness and decreased standing balance and decreased balance strategies.  Prior to hospitalization, patient was using a RW (some use of w/c) due to increased dizziness and needed assist with ADLs  and lived with Spouse;Family (pt's mom) in a Mobile home.  Home access is  Ramped entrance.  Patient will benefit from skilled PT intervention to maximize safe functional mobility, minimize fall risk and  decrease caregiver burden for planned discharge home with 24 hour supervision with pt's mom (wife works). Anticipate patient will benefit from follow up [place] at discharge.  PT - End of Session Endurance Deficit: Yes PT Assessment Rehab Potential: Good Barriers to Discharge: None PT Plan PT Intensity: Minimum of 1-2 x/day ,45 to 90 minutes PT Frequency: 5 out of 7 days PT Duration Estimated Length of Stay: 2-2.5 weeks PT  Treatment/Interventions: Ambulation/gait training;Balance/vestibular training;Community reintegration;Discharge planning;Disease management/prevention;DME/adaptive equipment instruction;Functional mobility training;Neuromuscular re-education;Pain management;Patient/family education;Skin care/wound management;Psychosocial support;Splinting/orthotics;Stair training;Therapeutic Activities;Therapeutic Exercise;UE/LE Strength taining/ROM;UE/LE Coordination activities;Visual/perceptual remediation/compensation;Wheelchair propulsion/positioning PT Recommendation Follow Up Recommendations: Home health PT Patient destination: Home Equipment Details: pt reports already having RW  Skilled Therapeutic Intervention Treatment focused on functional transfers, overall endurance and activity tolerance, sit to stands, and gait with RW with min A and second person for close chair follows. Cues for hand placement during transfers and encouragement provided as pt somewhat anxious with mobility. Difficulty with verbal communication; used writing as able during session and hand gestures; unclear if cognition is truly impaired for if communication/hearing is limiting factor.   PT Evaluation Precautions/Restrictions Precautions Precautions: Fall Precaution Comments: dizziness with mobility, deaf R ear, blind R eye, double vision, open stoma Pain Premedicated for pain in R side of head/eye/ear. Home Living/Prior Functioning Home Living Lives With: Spouse;Family (pt's mom) Available Help at Discharge: Family Type of Home: Mobile home Home Access: Ramped entrance Home Layout: One level How Accessible: Accessible via walker Home Adaptive Equipment: Walker - rolling Vision/Perception  Perception Perception: Impaired (R visual field)  Cognition Overall Cognitive Status: Appears within functional limits for tasks assessed Orientation Level: Oriented to person;Oriented to place;Oriented to situation Comments:  difficulty with hearing/communication; unclear if cognition is impaired Sensation Sensation Light Touch: Appears Intact (reports coldness in LEs) Coordination Gross Motor Movements are Fluid and Coordinated: Yes Motor  Motor Motor: Within Functional Limits  Locomotion  Ambulation Ambulation/Gait Assistance: 4: Min assist Gait Gait Pattern: Wide base of support;Shuffle  Trunk/Postural Assessment  Cervical Assessment Cervical Assessment: Within Functional Limits Thoracic Assessment Thoracic Assessment:  (flexed posture in standing) Lumbar Assessment Lumbar Assessment:  (difficult to assess due to body habitus)  Balance Static Sitting Balance Static Sitting - Level of Assistance: 6: Modified independent (Device/Increase time) Dynamic Sitting Balance Dynamic Sitting - Level of Assistance: 5: Stand by assistance Static Standing Balance Static Standing - Level of Assistance: 4: Min assist Dynamic Standing Balance Dynamic Standing - Level of Assistance: 4: Min assist;3: Mod assist Extremity Assessment      RLE Assessment RLE Assessment: Exceptions to [facility] RLE Strength RLE Overall Strength Comments: grossly 3+/5; decreased muscular endurance LLE Assessment LLE Assessment: Exceptions to [facility] LLE Strength LLE Overall Strength Comments: grossly 3+/5; decreased muscular endurance  FIM:  FIM - Bed/Chair Transfer Bed/Chair Transfer Assistive Devices: HOB elevated Bed/Chair Transfer: 3: Supine > Sit: Mod A (lifting assist/Pt. 50-74%/lift 2 legs;3: Bed > Chair or W/C: Mod A (lift or lower assist);3: Chair or W/C > Bed: Mod A (lift or lower assist) FIM - Locomotion: Wheelchair Locomotion: Wheelchair: 1: Total Assistance/staff pushes wheelchair (Pt<25%) FIM - Locomotion: Ambulation Locomotion: Ambulation Assistive Devices: Administrator Ambulation/Gait Assistance: 4: Min assist Locomotion: Ambulation: 1: Two helpers (close chair follow) FIM - Locomotion: Stairs Locomotion: Stairs:  0: Activity did not occur   Refer to Care Plan for Long Term Goals  Recommendations for other services: None. Will benefit from further visual and vestibular assessment.  Discharge Criteria: Patient will be discharged from PT if  patient refuses treatment 3 consecutive times without medical reason, if treatment goals not met, if there is a change in medical status, if patient makes no progress towards goals or if patient is discharged from hospital.  The above assessment, treatment plan, treatment alternatives and goals were discussed and mutually agreed upon: by patient  Allayne Gitelman 10/23/2012, 9:51 AM

## 2012-10-23 NOTE — Progress Notes (Signed)
Pt requested prn respiratory treatment, RT notified

## 2012-10-23 NOTE — Progress Notes (Signed)
Pt arrived via Bariatric bed to room 4027 at 1947. Alert and orient x 3 nonverbal d/t stoma however able to communicate with head and hand gestures. Pt vss Oxygen at 3lpm via n/c o2 sats 96% no sob or resp distress noted. Pt gown saturated with thick yellow secretions, dressing and gown changed. Orientated to room, call bell system and safety of unit nodded head in acknowledgement. Will continue to monitor.

## 2012-10-23 NOTE — Evaluation (Signed)
Occupational Therapy Assessment and Plan  Patient Details  Name: [name] MRN: [id number] Date of Birth: 1961-02-03  OT Diagnosis: acute pain, disturbance of vision and muscle weakness (generalized) Rehab Potential: Rehab Potential: Good ELOS: 2-3 weels   Today's Date: 10/23/2012 Time: 1030-1125 Time Calculation (min): 55 min  Problem List:  Patient Active Problem List  Diagnosis  . CAD (coronary artery disease) of artery bypass graft  . Failed CABG (coronary artery bypass graft)  . Preop cardiovascular exam  . Morbid obesity  . Tobacco abuse  . Acoustic neuroma  . Status post craniotomy  . Respiratory failure, post-operative  . Tracheostomy status    Past Medical History:  Past Medical History  Diagnosis Date  . CAD (coronary artery disease)     Cath 09, 99% LAD  . Cardiomyopathy, ischemic     Ischemic  . Obesity   . Fibromyalgia   . Shortness of breath   . CHF (congestive heart failure)   . Hypertension     dr Percival Spanish  . Cyst near coccyx   . Diabetes mellitus without complication     borderline  . Cyst near tailbone   . Kidney stones   . Myocardial infarction   . Headache     with lights   Past Surgical History:  Past Surgical History  Procedure Laterality Date  . Coronary artery bypass graft      LIMA to LAD 2009  . Umbilical hernia repair    . Anal fistulectomy    . Retrosigmoid craniectomy for tumor resection Right 10/11/2012    Procedure: RETROSIGMOID CRANIECTOMY FOR TUMOR RESECTION;  Surgeon: [name], MD;  Location: [facility];  Service: Neurosurgery;  Laterality: Right;  Craniotomy for acoustic neuroma  . Tracheostomy tube placement N/A 10/11/2012    Procedure: TRACHEOSTOMY;  Surgeon: Ascencion Dike, MD;  Location: [facility];  Service: ENT;  Laterality: N/A;  . Acoustic neuroma resection N/A 10/11/2012    Procedure: ACOUSTIC NEUROMA RESECTION;  Surgeon: Ascencion Dike, MD;  Location: [facility];  Service: ENT;  Laterality: N/A;     Assessment & Plan Clinical Impression: Patient is a 52 y.o. 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 He was admitted on 10/11/12 for acoustic neroma resection by Dr. Christella Noa and tracheostomy by Dr. Benjamine Mola. Post op required slow vent wean but tolerated ATC and was de-cannulated on 03/03. Oral secretions have decreased and FEES done revealed delay in swallow with ?aspiration/penetration with straws. Was started on D3, thin liquids. He continues to have right facial palsy, dizziness with activity and inability to phonate. He needs cues for safety and gaze stabilization.    Patient transferred to CIR on 10/22/2012 .    Patient currently requires total with basic self-care skills and min to mod A for basic mobility secondary to muscle weakness, decreased cardiorespiratoy endurance and decreased oxygen support, significant deconditioned, decreased FMC, decreased visual acuity, decreased visual perceptual skills, decreased visual motor skills and double vision, and decreased standing balance, decreased postural control and decreased balance strategies.  Prior to hospitalization, patient could complete ADLs with independent .  Patient will benefit from skilled intervention to decrease level of assist with basic self-care skills and increase independence with  basic self-care skills prior to discharge home with care partner.  Anticipate patient will require intermittent supervision and follow up home health.  OT - End of Session Activity Tolerance: Tolerates 10 - 20 min activity with multiple rests Endurance Deficit: Yes OT Assessment Rehab Potential: Good OT Plan OT Intensity: Minimum of 1-2 x/day, 45 to 90 minutes OT  Duration/Estimated Length of Stay: 2-3 weels OT Treatment/Interventions: Balance/vestibular training;Cognitive remediation/compensation;Community reintegration;Discharge planning;DME/adaptive equipment instruction;Functional mobility training;Pain management;Self Care/advanced ADL retraining;Therapeutic Activities;Patient/family education;Skin care/wound managment;Therapeutic Exercise;Visual/perceptual remediation/compensation;Wheelchair propulsion/positioning;UE/LE Strength taining/ROM;Splinting/orthotics;Psychosocial support;Disease mangement/prevention;Neuromuscular re-education OT Recommendation Recommendations for Other Services: Neuropsych consult (when appropriate ) Patient destination: Home Follow Up Recommendations: Home health OT   Skilled Therapeutic Intervention   OT Evaluation Precautions/Restrictions  Precautions Precautions: Fall Precaution Comments: dizziness with mobility, deaf R ear, blind R eye, double vision, open stoma Restrictions Weight Bearing Restrictions: No General Chart Reviewed: Yes Family/Caregiver Present: No Vital Signs Therapy Vitals Pulse Rate: 62 (62) BP: 141/89 mmHg Pain  c/o pain in right side of face - RN made aware and came and gave meds. Home Living/Prior Functioning Home Living Lives With: Spouse Available Help at Discharge: Family Type of Home: Mobile home Home Access: Ramped entrance Home Layout: One level Bathroom Shower/Tub: Chiropodist: Standard Bathroom Accessibility: Yes How Accessible: Accessible via walker Home Adaptive Equipment: Walker - rolling ADL  see FIM Vision/Perception  Vision - History Baseline Vision: No visual deficits Vision - Assessment Eye Alignment: Impaired (comment) Vision Assessment: Vision tested Ocular Range of Motion: Restricted looking up Tracking/Visual Pursuits: Decreased smoothness of vertical tracking;Decreased smoothness of eye movement to RIGHT superior field Saccades:  Additional eye shifts occurred during testing;Additional head turns occurred during testing Convergence: Impaired (comment) Diplopia Assessment: Present in near gaze;Present in far gaze;Only with right gaze;Only with left gaze Additional Comments: Pt with nystagmus with multiple beats to the right wtih right gaze; one beat with scanning to the left Perception Perception: Impaired (R visual field) Praxis Praxis: Intact  Cognition Overall Cognitive Status: Appears within functional limits for tasks assessed Orientation Level: Oriented to person;Oriented to place;Oriented to situation Attention: Selective Selective Attention: Appears intact Memory: Impaired Memory Impairment: Decreased short term memory Comments: difficulty with hearing/communication; unclear if cognition is impaired Sensation Sensation Light Touch: Appears Intact Proprioception: Appears Intact Coordination Gross Motor Movements are Fluid and Coordinated: Yes Fine Motor Movements are Fluid and Coordinated: Yes Motor  Motor Motor: Within Functional Limits Motor - Skilled Clinical Observations: generalized weakness  Mobility  Transfers Sit to Stand: 4: Min assist Stand to Sit: 4: Min assist  Trunk/Postural Assessment  Cervical Assessment Cervical Assessment: Within Functional Limits Thoracic Assessment Thoracic Assessment:  (flexed posture with standing ) Lumbar Assessment Lumbar Assessment:  (difficult to assess due to body habitus)  Balance Static Sitting Balance Static Sitting - Balance Support: Bilateral upper extremity supported Static Sitting - Level of Assistance: 6: Modified independent (Device/Increase time) Dynamic Sitting Balance Dynamic Sitting - Level of Assistance: 5: Stand by assistance Static Standing Balance Static Standing - Balance Support: During functional activity;Bilateral upper extremity supported Static Standing - Level of Assistance: 4: Min assist Dynamic Standing Balance Dynamic  Standing - Level of Assistance: 4: Min assist;3: Mod assist Extremity/Trunk Assessment RUE Assessment RUE Assessment: Within Functional Limits (grossly 4/5) LUE Assessment LUE Assessment: Within Functional Limits (grossly 4/5)  FIM:  FIM - Grooming Grooming Steps: Brush, comb hair;Wash, rinse, dry face;Wash, rinse, dry hands Grooming: 2: Patient completes 1 of 4 or 2 of 5 steps FIM - Bathing  Bathing: 1: Total-Patient completes 0-2 of 10 parts or less than 25% FIM - Upper Body Dressing/Undressing Upper body dressing/undressing: 3: Mod-Patient completed 50-74% of tasks FIM - Lower Body Dressing/Undressing Lower body dressing/undressing: 1: Total-Patient completed less than 25% of tasks FIM - Engineer, site Assistive Devices: HOB elevated Bed/Chair Transfer: 4: Sit > Supine: Min A (steadying pt. > 75%/lift 1 leg);4: Chair or W/C > Bed: Min A (steadying Pt. > 75%);4: Bed > Chair or W/C: Min A (steadying Pt. > 75%)   Refer to Care Plan for Long Term Goals  Recommendations for other services: Neuropsych when appropriate   Discharge Criteria: Patient will be discharged from OT if patient refuses treatment 3 consecutive times without medical reason, if treatment goals not met, if there is a change in medical status, if patient makes no progress towards goals or if patient is discharged from hospital.  The above assessment, treatment plan, treatment alternatives and goals were discussed and mutually agreed upon: by patient  1:1 OT eval initiated and OT role, purpose and goals discussed with pt. Focus on activity tolerance, sit to stands, short distance functional ambulation with RW (dresser to bed), endurance training. Pt asked to get into bed when OT arrived but agreed to work in therapy. Pt reluctant to perform bathing and dressing - wanting total A- due to fatigue and decreased breath support. Pt with ongoing productive cough through stoma (RN aware). Pt with nystagmus  with tracking to the right with multiple beats and one beat with tracking to left. Due to visual disturbances with writing pt demonstrated spatial difficulties.   Willeen Cass [facility] 10/23/2012, 12:23 PM

## 2012-10-24 ENCOUNTER — Inpatient Hospital Stay (HOSPITAL_COMMUNITY): Payer: BC Managed Care – PPO | Admitting: Occupational Therapy

## 2012-10-24 ENCOUNTER — Inpatient Hospital Stay (HOSPITAL_COMMUNITY): Payer: BC Managed Care – PPO

## 2012-10-24 ENCOUNTER — Inpatient Hospital Stay (HOSPITAL_COMMUNITY): Payer: BC Managed Care – PPO | Admitting: Physical Therapy

## 2012-10-24 ENCOUNTER — Inpatient Hospital Stay (HOSPITAL_COMMUNITY): Payer: BC Managed Care – PPO | Admitting: Speech Pathology

## 2012-10-24 LAB — URINE CULTURE
Colony Count: NO GROWTH
Culture: NO GROWTH
Special Requests: NORMAL

## 2012-10-24 LAB — GLUCOSE, CAPILLARY: Glucose-Capillary: 187 mg/dL — ABNORMAL HIGH (ref 70–99)

## 2012-10-24 MED ORDER — SENNA 8.6 MG PO TABS: 1.0000 | ORAL_TABLET | Freq: Every day | ORAL | Status: DC | PRN

## 2012-10-24 MED ORDER — GUAIFENESIN ER 600 MG PO TB12
1200.0000 mg | ORAL_TABLET | Freq: Two times a day (BID) | ORAL | Status: DC
  Administered 2012-10-24 (×2): 1200 mg via ORAL
  Administered 2012-10-25: 600 mg via ORAL
  Administered 2012-10-25 – 2012-11-12 (×36): 1200 mg via ORAL
  Filled 2012-10-24 (×43): qty 2

## 2012-10-24 MED ORDER — IPRATROPIUM BROMIDE 0.02 % IN SOLN
0.5000 mg | Freq: Four times a day (QID) | RESPIRATORY_TRACT | Status: DC
  Administered 2012-10-24 – 2012-10-27 (×11): 0.5 mg via RESPIRATORY_TRACT
  Filled 2012-10-24 (×11): qty 2.5

## 2012-10-24 NOTE — Progress Notes (Signed)
Occupational Therapy Session Note  Patient Details  Name: [name] MRN: [id number] Date of Birth: [birth year]  Today's Date: 10/24/2012 Time: 1330-1400 Time Calculation (min): 30 min  Short Term Goals: Week 1:  OT Short Term Goal 1 (Week 1): Pt will don shirt with supervision OT Short Term Goal 2 (Week 1): Pt will thread LB with min A OT Short Term Goal 3 (Week 1): Pt will transfer to toilet with min A with RW OT Short Term Goal 4 (Week 1): Pt will perform 3/3 toileting steps with max A  Skilled Therapeutic Interventions/Progress Updates:    1:1 focus on bed mobility to come to EOB, stand pivot transfer with RW, practiced covering up trach site in order to make vocalizations with adequate breath support for wants and needs. Pt requested a fan; found one and placed in room.   Therapy Documentation Precautions:  Precautions Precautions: Fall Precaution Comments: dizziness with mobility, deaf R ear, blind R eye, double vision, open stoma Restrictions Weight Bearing Restrictions: No Pain: Pain Assessment Pain Assessment: No/denies pain    See FIM for current functional status  Therapy/Group: Individual Therapy  Willeen Cass [facility] 10/24/2012, 2:48 PM

## 2012-10-24 NOTE — Progress Notes (Signed)
Stoma care and neb tx given to Pt.  Dressing changed, pt sx through stoma (copious yellow/green secretions).  Pt stay stable through procedure.  Placed back 3lpm [place].

## 2012-10-24 NOTE — Progress Notes (Signed)
Occupational Therapy Session Note  Patient Details  Name: [name] MRN: [id number] Date of Birth: 1961/07/23  Today's Date: 10/24/2012 Time: 0900-1000 Time Calculation (min): 60 min  Short Term Goals: Week 1:  OT Short Term Goal 1 (Week 1): Pt will don shirt with supervision OT Short Term Goal 2 (Week 1): Pt will thread LB with min A OT Short Term Goal 3 (Week 1): Pt will transfer to toilet with min A with RW OT Short Term Goal 4 (Week 1): Pt will perform 3/3 toileting steps with max A  Skilled Therapeutic Interventions/Progress Updates:    Pt seated in w/c upon arrival.  Dressing over trach stoma had dislodged and RN notified and reapplied.  Pt did not have any clean clothes and elected to wear clean hospital gown.  Pt required asssitance with LB bathing.  Pt performed sit<>stand X 6 and stood for approx 60 secs each time.  Pt expressed frustration with inability to verbally communicate.  Focus on activity tolerance, sit<>stand, standing balance, and safety awareness.  O2 sats >90% on 3L O2.  Therapy Documentation Precautions:  Precautions Precautions: Fall Precaution Comments: dizziness with mobility, deaf R ear, blind R eye, double vision, open stoma Restrictions Weight Bearing Restrictions: No Pain: Pain Assessment Pain Assessment: No/denies pain   See FIM for current functional status  Therapy/Group: Individual Therapy  Leroy Libman 10/24/2012, 10:02 AM

## 2012-10-24 NOTE — Progress Notes (Signed)
Physical Therapy Session Note  Patient Details  Name: [name] MRN: [id number] Date of Birth: [birth year]  Today's Date: 10/24/2012 Time: 0730-0827 Time Calculation (min): 57 min  Short Term Goals: Week 1:  PT Short Term Goal 1 (Week 1): Pt will transfer with S from various surface heights  PT Short Term Goal 2 (Week 1): Pt will gait x 25' with min A PT Short Term Goal 3 (Week 1): Pt will demonstrate dynamic standing balance during functional tasks with steady A  Skilled Therapeutic Interventions/Progress Updates:    Initiated session with vestibular exercises and depth perception activities utilizing letter "A" in supine, sitting, and standing positions. Pt currently reports 9/10 dizziness at all times that does not improve or worsen with activity or vestibular exercises. Currently compensation techniques do not provide relief. Will continue to address dizziness and search out means to improve vestibular nerve discrepancies. Pt is to practice x1 VOR modified exercises in addition to another exercise to try to decrease double vision.   Ambulation 3 x 15' with RW and min-guard assist with wheelchair close to follow. Cues for attempt for visual targeting however this did not provide relief today. Pt demonstrates a wide base of support to compensate for impaired balance. Ambulation distance most limited by deconditioning, however dizziness is a factor. Cues needed to remember to lock wheelchair brakes prior to standing, cues needed throughout session for decreased memory.   Sit <> stands x 5 reps for conditioning, rest as needed. SpO2 >89% on 3L [place] O2, HR up to 103 bpm during ambulation.    Therapy Documentation Precautions:  Precautions Precautions: Fall Precaution Comments: dizziness with mobility, deaf R ear, blind R eye, double vision, open stoma Restrictions Weight Bearing Restrictions: No Pain: Pain Assessment Pain Assessment: No/denies pain Locomotion  : Ambulation Ambulation/Gait Assistance: 4: Min assist   See FIM for current functional status  Therapy/Group: Individual Therapy  Lahoma Rocker 10/24/2012, 11:51 AM

## 2012-10-24 NOTE — Progress Notes (Signed)
Speech Language Pathology Daily Session Note  Patient Details  Name: [name] MRN: [id number] Date of Birth: 1960-12-21  Today's Date: 10/24/2012 Time: 1000-1100 Time Calculation (min): 60 min  Short Term Goals: Week 1: SLP Short Term Goal 1 (Week 1): Pt will apply pressure to stoma to increase vocal intensity at the word level with Mod A verbal and visual cues.  SLP Short Term Goal 2 (Week 1): Pt will utilize swallowing compensatory strategies to minimize overt s/s of aspiration with Mod A verbal and tactile cues.  SLP Short Term Goal 3 (Week 1): Pt will demonstrate sustained attention to functional tasks for 15 minutes with Mod A verbal cues for redirection SLP Short Term Goal 4 (Week 1): Pt will recall newly learned information with Mod A question and visual cues.   Skilled Therapeutic Interventions: Treatment focus on functional communication and dysphagia goals. SLP facilitated session by providing Mod verbal cues for utilization of small, single sips with thin liquids via cup to decrease right anterior spillage. Pt also provided Max verbal and question cues to self-monitor and correct anterior spillage throughout the session. Pt refused to consume Dys. 2 textures due to difficulty with mastication and after extensive education, pt asked to be downgraded to Dys. 1 textures to increase PO intake and decrease overall frustration with meals. Pt aphonic today at the word level despite applying pressure to stoma. Pt demonstrated poor frustration tolerance and reported his stoma site is sore which is also impacting is willingness to utilize strategy to increase functional communication.  Pt utilized gestures for functional communication. Pt also attempted to utilize written expression, however, this strategy is not effective at this time due to decreased legibility.    FIM:  Comprehension Comprehension Mode: Auditory Comprehension: 4-Understands basic 75 - 89% of the time/requires cueing  10 - 24% of the time Expression Expression Mode: Verbal Expression: 2-Expresses basic 25 - 49% of the time/requires cueing 50 - 75% of the time. Uses single words/gestures. Social Interaction Social Interaction: 3-Interacts appropriately 50 - 74% of the time - May be physically or verbally inappropriate. Problem Solving Problem Solving: 3-Solves basic 50 - 74% of the time/requires cueing 25 - 49% of the time Memory Memory: 3-Recognizes or recalls 50 - 74% of the time/requires cueing 25 - 49% of the time FIM - Eating Eating Activity: 5: Needs verbal cues/supervision;4: Helper checks for pocketed food  Pain Pain Assessment Pain Assessment: No/denies pain  Therapy/Group: Individual Therapy  PAYNE, COURTNEY 10/24/2012, 11:21 AM

## 2012-10-24 NOTE — Progress Notes (Signed)
Subjective/Complaints: Resting comfortably. Still with significant secretions. . A 12 point review of systems has been performed and if not noted above is otherwise negative.   Objective: Vital Signs: Blood pressure 149/78, pulse 65, temperature 98.2 F (36.8 C), temperature source Oral, resp. rate 20, weight 141.5 kg (311 lb 15.2 oz), SpO2 92.00%. No results found.  Recent Labs  10/22/12 0617 10/23/12 1223  WBC 20.0* 23.1*  HGB 15.4 16.5  HCT 45.9 48.0  PLT 430* 374    Recent Labs  10/22/12 0617 10/23/12 1223  NA 143 135  K 3.9 4.2  CL 102 95*  GLUCOSE 136* 161*  BUN 36* 26*  CREATININE 0.80 0.88  CALCIUM 10.0 10.0   CBG (last 3)   Recent Labs  10/23/12 1656 10/23/12 2041 10/24/12 0720  GLUCAP 143* 140* 165*    Wt Readings from Last 3 Encounters:  10/22/12 141.5 kg (311 lb 15.2 oz)  10/21/12 149.7 kg (330 lb 0.5 oz)  10/21/12 149.7 kg (330 lb 0.5 oz)    Physical Exam:  Constitutional: He is oriented to person, place, and time. He appears well-developed and well-nourished.  Morbidly obese HENT:  Head: Normocephalic and atraumatic.  Mouth/Throat: Abnormal dentition. Dental caries present. Oropharyngeal exudate present.  Neck: Normal range of motion.  Large tracheal stoma still with thick, yellow, purulent appearing drainage. Dressing in place. Minimal phonation. Communicates with whisper and hand signals.  Cardiovascular: Normal rate and regular rhythm.  Pulmonary/Chest: Effort normal. He has decreased breath sounds in the right lower field and the left lower field. He has rhonchi.  Abdominal: Soft. Bowel sounds are normal. He exhibits no distension. There is no tenderness.  Musculoskeletal: He exhibits edema (1+ pedal edema.).  Bilateral fore and mid foot as well as toes are purple and cold to touch ("like it that way") +pedal pulses felt.  Neurological: He is alert and oriented to person, place, and time.  Dysphonic. Right facial droop, frontal droop,  unable to close right eyelid. Impulsive and impatient. Able to follow basic commands without difficulty. BUE with weakness and decrease in fine motor movements right greater than left. Strength grossly 3 to 3+ UE, 2+ to 3, LE's.  Skin: Skin is warm and dry.  Deep tissue injury of buttocks. Left lateral thigh with quarter size stage 2 ulcer. Stasis changes bilateral shins. Heels with dry skin.  Psych: more relaxed today.   Assessment/Plan: 1. 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 Physiatrist is providing close team supervision and 24 hour management of active medical problems listed below. Physiatrist and rehab team continue to assess barriers to discharge/monitor patient progress toward functional and medical goals. FIM: FIM - Bathing Bathing: 1: Total-Patient completes 0-2 of 10 parts or less than 25%  FIM - Upper Body Dressing/Undressing Upper body dressing/undressing: 3: Mod-Patient completed 50-74% of tasks FIM - Lower Body Dressing/Undressing Lower body dressing/undressing: 1: Total-Patient completed less than 25% of tasks        FIM - Engineer, site Assistive Devices: HOB elevated Bed/Chair Transfer: 4: Sit > Supine: Min A (steadying pt. > 75%/lift 1 leg);4: Chair or W/C > Bed: Min A (steadying Pt. > 75%);4: Bed > Chair or W/C: Min A (steadying Pt. > 75%)  FIM - Locomotion: Wheelchair Locomotion: Wheelchair: 1: Total Assistance/staff pushes wheelchair (Pt<25%) FIM - Locomotion: Ambulation Locomotion: Ambulation Assistive Devices: Environmental consultant -  Rolling Ambulation/Gait Assistance: 4: Min assist Locomotion: Ambulation: 1: Two helpers (close chair follow)  Comprehension Comprehension Mode: Auditory Comprehension: 4-Understands basic 75 - 89% of the  time/requires cueing 10 - 24% of the time  Expression Expression Mode: Verbal Expression Assistive Devices: 6-Other (Comment) (paper and pencil) Expression: 2-Expresses basic 25 - 49% of the time/requires cueing 50 - 75% of the time. Uses single words/gestures.  Social Interaction Social Interaction: 3-Interacts appropriately 50 - 74% of the time - May be physically or verbally inappropriate.  Problem Solving Problem Solving Mode: Not assessed Problem Solving: 3-Solves basic 50 - 74% of the time/requires cueing 25 - 49% of the time  Memory Memory Mode: Not assessed Memory: 2-Recognizes or recalls 25 - 49% of the time/requires cueing 51 - 75% of the time  Medical Problem List and Plan:  1. DVT Prophylaxis/Anticoagulation: Mechanical: Antiembolism stockings, knee (TED hose) Bilateral lower extremities  Sequential compression devices, below knee Bilateral lower extremities.  Add lovenox.  Will check dopplers.  2. Pain Management: N/A.   prn ultram.  3. Mood: Provide ego support and redirection. LCSW to follow for evaluation.  4. Neuropsych: This patient is capable of making decisions on his/her own behalf.  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 Continue Lacrilube tid with patching at nights to prevent corneal abrasion. Eye patch at night. 6. CAD: continue atenolol bid. Patient has declined statin per reports.  7. DM type 2- diet controlled: Will monitor BS with AC/HS CBG checks. Use SSI for elevated BS. Currently 120-200 range likely due to stress and stress dose steroids. Fair control at present 8. Chronic LE edema: Low salt diet. Added TEDs.  9. Leukocytosis: urine culture negative. continue cipro for serratia tracheobronchitis 500mg  bid. Begin packing of stoma once secretions/mucous decreases  -recheck cbc tomorrow 8. HTN: Monitor with bid checks. Continue tenormin bid. Blood pressures have been variable and will adjust as needed.  9. Acute renal insufficiency: Due to dehydration?  Encourage po fluid intake  LOS (Days) 2 A FACE TO FACE EVALUATION WAS PERFORMED  SWARTZ,ZACHARY T 10/24/2012 7:38 AM

## 2012-10-24 NOTE — Progress Notes (Signed)
[facility] Individual Statement of Services  Patient Name:  [name]  Date:  10/24/2012  Welcome to the [facility].  Our goal is to provide you with an individualized program based on your diagnosis and situation, designed to meet your specific needs.  With this comprehensive rehabilitation program, you will be expected to participate in at least 3 hours of rehabilitation therapies Monday-Friday, with modified therapy programming on the weekends.  Your rehabilitation program will include the following services:  Physical Therapy (PT), Occupational Therapy (OT), Speech Therapy (ST), 24 hour per day rehabilitation nursing, Therapeutic Recreaction (TR), Neuropsychology, Case Management (Social Worker), Rehabilitation Medicine, Nutrition Services and Pharmacy Services  Weekly team conferences will be held on Tuesdays to discuss your progress.  Your  Social Worker will talk with you frequently to get your input and to update you on team discussions.  Team conferences with you and your family in attendance may also be held.  Expected length of stay: 2-3 weeks  Overall anticipated outcome: supervision to modified                                                                                                                         independent  Depending on your progress and recovery, your program may change.  Your  Social Worker will coordinate services and will keep you informed of any changes.  Your  Social Worker's name and contact numbers are listed  below.  The following services may also be recommended but are not provided by the [facility] will be made to provide these services after discharge if needed.  Arrangements include referral to agencies that provide these services.  Your  insurance has been verified to be:  [place] Your primary doctor is:  Dr. Claris Gower  Pertinent information will be shared with your doctor and your insurance company.  Social Worker:  [place], [place] or [phone]  Information discussed with and copy given to patient by: Lennart Pall, 10/24/2012, 3:39 PM

## 2012-10-24 NOTE — Progress Notes (Signed)
Upon entering pt room noted nasal cannular out of pt nose and resting under left eye.  Checked sats which were down to 80%.  Pt given 0200 Neb tx, stoma checked, pt suctioned, stoma and area cleaned dried and re-dressed.  Placed pt on 50% Venturi mask and had RN order and place pt on continue pulse ox.  Pt currently at 92% and resting.  RN to monitor and notify with any problems.

## 2012-10-24 NOTE — Progress Notes (Signed)
Copious secretions this AM; o2 sat sustained at 92-94% on 3l's per nasal cannula; secretions diminished throughout day, increased this eve; odor noted this eve., note for MD. HOB up 45 degrees at all times.  C/O headache , 1 vicodin x2 today, effective.

## 2012-10-25 ENCOUNTER — Inpatient Hospital Stay (HOSPITAL_COMMUNITY): Payer: BC Managed Care – PPO | Admitting: Physical Therapy

## 2012-10-25 ENCOUNTER — Inpatient Hospital Stay (HOSPITAL_COMMUNITY): Payer: BC Managed Care – PPO

## 2012-10-25 ENCOUNTER — Inpatient Hospital Stay (HOSPITAL_COMMUNITY): Payer: BC Managed Care – PPO | Admitting: Speech Pathology

## 2012-10-25 LAB — GLUCOSE, CAPILLARY: Glucose-Capillary: 116 mg/dL — ABNORMAL HIGH (ref 70–99)

## 2012-10-25 LAB — CBC
HCT: 44.9 % (ref 39.0–52.0)
MCV: 94.3 fL (ref 78.0–100.0)
RDW: 14.1 % (ref 11.5–15.5)
WBC: 20.6 10*3/uL — ABNORMAL HIGH (ref 4.0–10.5)

## 2012-10-25 MED ORDER — GLYCOPYRROLATE 1 MG PO TABS
1.0000 mg | ORAL_TABLET | Freq: Two times a day (BID) | ORAL | Status: DC
  Administered 2012-10-25 – 2012-11-12 (×37): 1 mg via ORAL
  Filled 2012-10-25 (×43): qty 1

## 2012-10-25 NOTE — Progress Notes (Signed)
Social Work Assessment and Plan Social Work Assessment and Plan  Patient Details  Name: [name] MRN: [id number] Date of Birth: [birth year]  Today's Date: 10/25/2012  Problem List:  Patient Active Problem List  Diagnosis  . CAD (coronary artery disease) of artery bypass graft  . Failed CABG (coronary artery bypass graft)  . Preop cardiovascular exam  . Morbid obesity  . Tobacco abuse  . Acoustic neuroma  . Status post craniotomy  . Respiratory failure, post-operative  . Tracheostomy status  . Serratia infection  . Facial nerve palsy  . Leucocytosis  . Prerenal renal failure  . Impaired fasting glucose   Past Medical History:  Past Medical History  Diagnosis Date  . CAD (coronary artery disease)     Cath 09, 99% LAD  . Cardiomyopathy, ischemic     Ischemic  . Obesity   . Fibromyalgia   . Shortness of breath   . CHF (congestive heart failure)   . Hypertension     dr Percival Spanish  . Cyst near coccyx   . Diabetes mellitus without complication     borderline  . Cyst near tailbone   . Kidney stones   . Myocardial infarction   . Headache     with lights   Past Surgical History:  Past Surgical History  Procedure Laterality Date  . Coronary artery bypass graft      LIMA to LAD 2009  . Umbilical hernia repair    . Anal fistulectomy    . Retrosigmoid craniectomy for tumor resection Right 10/11/2012    Procedure: RETROSIGMOID CRANIECTOMY FOR TUMOR RESECTION;  Surgeon: [name], MD;  Location: [facility];  Service: Neurosurgery;  Laterality: Right;  Craniotomy for acoustic neuroma  . Tracheostomy tube placement N/A 10/11/2012    Procedure: TRACHEOSTOMY;  Surgeon: Ascencion Dike, MD;  Location: [facility] NEURO ORS;  Service: ENT;  Laterality: N/A;  . Acoustic neuroma resection N/A 10/11/2012    Procedure: ACOUSTIC NEUROMA RESECTION;  Surgeon: Ascencion Dike, MD;  Location: [facility] NEURO ORS;  Service: ENT;  Laterality: N/A;   Social History:  reports that he has been smoking  Cigarettes.  He has a 10 pack-year smoking history. He uses smokeless tobacco. He reports that he does not drink alcohol or use illicit drugs.  Family / Support Systems Marital Status: Married How Long?: 20+ yrs Patient Roles: Spouse Spouse/Significant Other: wife, Quanta Hafer @ (H) [phone] or ([phone] Children: No children Other Supports: pt's mother 52 yrs old) living with them, Jess Barters Anticipated Caregiver: wife and neighbor who can assist Ability/Limitations of Caregiver: wife works, Mom 31 yo and can do supervision, neighbor can assist Caregiver Availability: 24/7 Family Dynamics: wife very attentive and reports good relationship with pt's mother - both willing to "do anything we can"  Social History Preferred language: English Religion: Baptist Cultural Background: NA Education: HS Read: Yes Write: Yes Employment Status: Employed 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  Appears to get frustrated with limited communication ability, therefore, did not attempt much discussion.  When wife present, she was able to provide remaining information with pt listening.  Beyond noted frustration with communitcation, difficult ot fully assess  emotional adjustment status.  Will monitor via behavior untll pt able to communitcate easier. may benefit from formal neuropsych eval at some point. Recent Psychosocial Issues: None Pyschiatric History: None  Patient / Family Perceptions, Expectations & Goals Pt/Family understanding of illness & functional limitations: Pt and wife appear to have  basic understanding of his medical course/ treatments and current functional levels.  Education ongoing. Premorbid pt/family roles/activities: Pt and wife working full-time, however, pt's functioning had been declining over past few months Anticipated changes in roles/activities/participation: wife and mother to resume support roles for pt as they were providing PTA Pt/family expectations/goals: "I want him to just get stronger" (wife)  Recruitment consultant: None Premorbid Home Care/DME Agencies: None Transportation available at discharge: yes Resource referrals recommended: Neuropsychology  Discharge Planning Living Arrangements: Parent;Spouse/significant other Support Systems: Spouse/significant other;Parent;Friends/neighbors Type of Residence: Private residence Administrator, sports: Multimedia programmer (specify) ([place]) Museum/gallery curator Resources: Employment Museum/gallery curator Screen Referred: No Living Expenses: Higher education careers adviser Management: Patient Do you have any problems obtaining your medications?: No Home Management: wife and mother Patient/Family Preliminary Plans: Plan is for pt to d/c home wit wife and mother providing assistance Social Work Anticipated Follow Up Needs: HH/OP Expected length of stay:  ELOS 3 weeks  Clinical Impression Unfortunate gentleman here after neuroma removal and significant functional decline/ deconditioning.  Frustrated with his limited communication ability.  Wife and mother supportive, however, wife does work.  Pt needs to reach a supervision level for mother to provide at home.  Monitor emotional adjustment needs throughout - may benefit from neuropsych referral at some point.    HOYLE, [name] 10/25/2012, 10:20 AM

## 2012-10-25 NOTE — Progress Notes (Addendum)
Physical Therapy Session Note  Patient Details  Name: [name] MRN: [id number] Date of Birth: 08-Jun-1961  Today's Date: 10/25/2012 Time: 1300-1400 Time Calculation (min): 60 min  Short Term Goals: Week 1:  PT Short Term Goal 1 (Week 1): Pt will transfer with S from various surface heights  PT Short Term Goal 2 (Week 1): Pt will gait x 25' with min A PT Short Term Goal 3 (Week 1): Pt will demonstrate dynamic standing balance during functional tasks with steady A  Skilled Therapeutic Interventions/Progress Updates:    Slow to start due to headache, mod encouragement to participate. Pain medication given by nursing. Vestbilular exercises practiced, pt continues to struggle with memory of how to perform task correctly. Needs cues for keeping target still and moving head only. Pt easily frustrated by impaired vision, needs verbal encouragement to continue with therapies. Stand pivot transfer with RW bed>wheelchair min assist, wheelchair> mat min guard assist, wheelchair > bed min assist. Verbal cues needed for brakes, pt attempted to stand twice without one of the brakes locked. Ambulation x 12' with RW and min assist. Pt able to propel wheelchair x 60' with multiple rest breaks, verbal cues for obstacles on Rt. Side and encouraged pt to turn head to scan Rt. Visual field with mobility. Problem solving, safety awareness, and generalized endurance activities addressed throughout session.   SpO2 down to 84% at lowest on 4L [place] O2, HR up to 101 during session however returned to >94% and <80 bpm with short period of seated rest on 4L [place] O2.   Therapy Documentation Precautions:  Precautions Precautions: Fall Precaution Comments: dizziness with mobility, deaf R ear, blind R eye, double vision, open stoma Restrictions Weight Bearing Restrictions: No Pain: Pain Assessment Pain Assessment: 0-10 Pain Score:   7 Pain Type: Acute pain Pain Location: Head Pain Orientation: Anterior;Right Pain  Radiating Towards: shoulders Pain Descriptors: Aching Pain Onset: On-going Pain Intervention(s): RN made aware  See FIM for current functional status  Therapy/Group: Individual Therapy  Lahoma Rocker 10/25/2012, 3:53 PM

## 2012-10-25 NOTE — Progress Notes (Signed)
NUTRITION FOLLOW UP  DOCUMENTATION CODES  Per approved criteria   -Morbid Obesity    Intervention:   1. Continue Ensure Pudding, Prostat, and Magic Cups 2. Diet rec's per SLP 3. RD to continue to follow nutrition care plan  Nutrition Dx:   Increased nutrient needs related to wound healing as evidenced by estimated needs. Ongoing.  Goal:   Intake to meet >90% of estimated nutrition needs. Improving.  Monitor:   weight trends, lab trends, I/O's, PO intake, supplement tolerance  Assessment:   Admitted with complains of R-sided hearing loss x 1 year and 39-month history of difficulty using hands, hoarseness, urinary frequency, and dizziness with balance problems requiring use of wheelchair. 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 He was admitted on 2/21 for acoustic neroma resection. Post-op required slow vent wean but tolerated ATC and was de-cannulated on 3/03. Oral secretions have decreased and FEES done revealed delay in swallow with ?aspiration/penetration with straws. Was started on D3, thin liquids. He continues to have right facial palsy, dizziness with activity and inability to phonate. Pt was followed by RD staff during acute hospitalization. Pt was maintained on enteral nutrition during vent and weaning process.   Per SLP note yesterday, pt refused to consume Dys 2 textures 2/2 difficulty with mastication. Pt was extensively educated by SLP, however pt asked to be downgraded to Dys 1 textures to increase PO intake and decrease overall frustration with meals.  Meal intake slowly improving. Continues with order for 30 ml Prostat and Ensure Pudding. SLP states that pt will take [place]. She notes that pt is eating more with pureed diet, ate oatmeal, yogurt, and magic cup this morning.  Continues to have persistent secretions at trach site.   Height: Ht Readings from Last 1 Encounters:  10/12/12 5\' 11"  (1.803 m)    Weight Status:   Wt Readings  from Last 1 Encounters:  10/22/12 311 lb 15.2 oz (141.5 kg)  No new weight available.  Re-estimated needs:  Kcal: 2300 - 2500 Protein: 115 - 140 grams Fluid: 2.3 - 2.5 liters  Skin: DTI to L and R buttocks  Diet Order: Dysphagia 1; thin liquids   Intake/Output Summary (Last 24 hours) at 10/25/12 1157 Last data filed at 10/24/12 1900  Gross per 24 hour  Intake    480 ml  Output    350 ml  Net    130 ml    Last BM: 3/3   Labs:   Recent Labs Lab 10/22/12 0617 10/23/12 1223  NA 143 135  K 3.9 4.2  CL 102 95*  CO2 34* 29  BUN 36* 26*  CREATININE 0.80 0.88  CALCIUM 10.0 10.0  GLUCOSE 136* 161*    CBG (last 3)   Recent Labs  10/24/12 2028 10/25/12 0720 10/25/12 1116  GLUCAP 159* 146* 158*    Scheduled Meds: . albuterol  2.5 mg Nebulization Q6H  . antiseptic oral rinse  15 mL Mouth Rinse QID  . artificial tears   Right Eye Q8H  . atenolol  50 mg Oral BID  . chlorhexidine  15 mL Mouth Rinse BID  . ciprofloxacin  500 mg Oral BID  . enoxaparin (LOVENOX) injection  40 mg Subcutaneous Q24H  . feeding supplement  1 Container Oral TID BM  . feeding supplement  30 mL Oral TID WC  . glycopyrrolate  1 mg Oral BID  . guaiFENesin  1,200 mg Oral BID  . hydrocerin   Topical BID  .  insulin aspart  0-15 Units Subcutaneous TID WC  . insulin aspart  0-5 Units Subcutaneous QHS  . insulin aspart  3 Units Subcutaneous TID WC  . ipratropium  0.5 mg Nebulization Q6H  . multivitamin with minerals  1 tablet Oral Daily  . saccharomyces boulardii  250 mg Oral BID    Continuous Infusions:  none  Inda Coke MS, RD, LDN Pager: [phone] After-hours pager: [phone]

## 2012-10-25 NOTE — Progress Notes (Signed)
Speech Language Pathology Daily Session Note  Patient Details  Name: [name] MRN: [id number] Date of Birth: [birth year]  Today's Date: 10/25/2012 Time: [id number] Time Calculation (min): 30 min  Short Term Goals: Week 1: SLP Short Term Goal 1 (Week 1): Pt will apply pressure to stoma to increase vocal intensity at the word level with Mod A verbal and visual cues.  SLP Short Term Goal 2 (Week 1): Pt will utilize swallowing compensatory strategies to minimize overt s/s of aspiration with Mod A verbal and tactile cues.  SLP Short Term Goal 3 (Week 1): Pt will demonstrate sustained attention to functional tasks for 15 minutes with Mod A verbal cues for redirection SLP Short Term Goal 4 (Week 1): Pt will recall newly learned information with Mod A question and visual cues.   Skilled Therapeutic Interventions: Treatment focus on speech and dysphagia goals. Pt consumed Dys. 1 textures and thin liquids via cup without overt s/s of aspiration but required Mod A verbal cues to utilize small bites, proper placement of bolus on left side of oral cavity and to self-monitor and correct right anterior spillage. Pt required total A demonstration, visual and verbal cues to apply pressure to stoma for verbal expression. Pt was able to phonate for one word utterances.    FIM:  Comprehension Comprehension Mode: Auditory Comprehension: 4-Understands basic 75 - 89% of the time/requires cueing 10 - 24% of the time Expression Expression Mode: Verbal Expression: 2-Expresses basic 25 - 49% of the time/requires cueing 50 - 75% of the time. Uses single words/gestures. Social Interaction Social Interaction: 3-Interacts appropriately 50 - 74% of the time - May be physically or verbally inappropriate. Problem Solving Problem Solving: 3-Solves basic 50 - 74% of the time/requires cueing 25 - 49% of the time Memory Memory: 3-Recognizes or recalls 50 - 74% of the time/requires cueing 25 - 49% of the time FIM -  Eating Eating Activity: 4: Helper checks for pocketed food;5: Needs verbal cues/supervision  Pain Pain Assessment Pain Assessment: 0-10 Pain Score:   7 Pain Type: Acute pain Pain Location: Head Pain Orientation: Right Pain Radiating Towards: shoulders Pain Descriptors: Aching Pain Onset: Gradual Pain Intervention(s): Medication (See eMAR)  Therapy/Group: Individual Therapy  PAYNE, COURTNEY 10/25/2012, 3:44 PM

## 2012-10-25 NOTE — Progress Notes (Signed)
Occupational Therapy Session Note  Patient Details  Name: [name] MRN: [id number] Date of Birth: 1960/12/08  Today's Date: 10/25/2012 Time: 0900-0955 Time Calculation (min): 55 min  Short Term Goals: Week 1:  OT Short Term Goal 1 (Week 1): Pt will don shirt with supervision OT Short Term Goal 2 (Week 1): Pt will thread LB with min A OT Short Term Goal 3 (Week 1): Pt will transfer to toilet with min A with RW OT Short Term Goal 4 (Week 1): Pt will perform 3/3 toileting steps with max A  Skilled Therapeutic Interventions/Progress Updates:    Pt seated in w/c upon arrival and agreeable to engaging in bathing and dressing tasks w/c level at sink.  Pt required assistance with LB bathing.  Pt initially agreed to donning clothes this morning but at end of session pt opted to don clean hospital gown.  Pt declined TED hose even after benefits explained. Pt performed sit<>stand and maintained standing at sink with supervision.  O2 sats dropped to 85% on 3L O2 during activity but recovered to 90% within 30 secs of pursed lip breathing.  Focus on activity tolerance, sit<>stand, standing balance, and safety awareness.    Therapy Documentation Precautions:  Precautions Precautions: Fall Precaution Comments: dizziness with mobility, deaf R ear, blind R eye, double vision, open stoma Restrictions Weight Bearing Restrictions: No   Pain: Pain Assessment Pain Assessment: No/denies pain  See FIM for current functional status  Therapy/Group: Individual Therapy  Leroy Libman 10/25/2012, 9:56 AM

## 2012-10-25 NOTE — Progress Notes (Signed)
Physical Therapy Session Note  Patient Details  Name: [name] MRN: [id number] Date of Birth: 01/14/1961  Today's Date: 10/25/2012 Time: 0730-0800 Time Calculation (min): 30 min  Short Term Goals: Week 1:  PT Short Term Goal 1 (Week 1): Pt will transfer with S from various surface heights  PT Short Term Goal 2 (Week 1): Pt will gait x 25' with min A PT Short Term Goal 3 (Week 1): Pt will demonstrate dynamic standing balance during functional tasks with steady A  Skilled Therapeutic Interventions/Progress Updates:    Most of session focused on continued assessment of vestibular system.  Eye Alignment Impaired-   Spontaneous  Nystagmus Not clear  Gaze holding nystagmus Yes  Smooth pursuit Impaired, mostly to Rt.  Oculomotor Impaired  Saccades impaired  VOR slow Impaired worse with head rotation to Lt.  Head Thrust Test Positive, worse with head thrust to Rt.  Head Shaking Nystagmus Not tested secondary to pt acuity  Rt. [employer] Not tested  Lt. [employer] Not tested  Rt. Roll Test Not tested  Lt. Roll Test  Not tested  Motion sensitivity Not tested      Practiced x1 VOR exercises (modified) and determined pt needs to turn head only to Rt. And back to midline until vision improves. (Pt reports clear vision with head rotated to far Rt, double vision with head in midline, and quadruple vision with head to the Lt) Pt given tactile cues for mechanics of exercise and pt struggles with appropriate speed/fluidity of movement and sometimes tries to move target instead of head. Pt performed stand pivot to wheelchair with min assist and verbal cues for safety. Pt encouraged to perform x1 VOR exercises between therapies while by self. Pt verbalized understanding.    Therapy Documentation Precautions:  Precautions Precautions: Fall Precaution Comments: dizziness with mobility, deaf R ear, blind R eye, double vision, open stoma Restrictions Weight Bearing Restrictions:  No Pain: Pain Assessment No  See FIM for current functional status  Therapy/Group: Individual Therapy  Lahoma Rocker 10/25/2012, 12:13 PM

## 2012-10-25 NOTE — Progress Notes (Signed)
Subjective/Complaints: Secretions are persistent. Afebrile. A 12 point review of systems has been performed and if not noted above is otherwise negative.   Objective: Vital Signs: Blood pressure 159/85, pulse 72, temperature 97.3 F (36.3 C), temperature source Oral, resp. rate 20, weight 141.5 kg (311 lb 15.2 oz), SpO2 96.00%. No results found.  Recent Labs  10/23/12 1223 10/25/12 0700  WBC 23.1* 20.6*  HGB 16.5 14.4  HCT 48.0 44.9  PLT 374 359    Recent Labs  10/23/12 1223  NA 135  K 4.2  CL 95*  GLUCOSE 161*  BUN 26*  CREATININE 0.88  CALCIUM 10.0   CBG (last 3)   Recent Labs  10/24/12 1637 10/24/12 2028 10/25/12 0720  GLUCAP 107* 159* 146*    Wt Readings from Last 3 Encounters:  10/22/12 141.5 kg (311 lb 15.2 oz)  10/21/12 149.7 kg (330 lb 0.5 oz)  10/21/12 149.7 kg (330 lb 0.5 oz)    Physical Exam:  Constitutional: He is oriented to person, place, and time. He appears well-developed and well-nourished.  Morbidly obese HENT:  Head: Normocephalic and atraumatic.  Mouth/Throat: Abnormal dentition. Dental caries present. Oropharyngeal exudate present.  Neck: Normal range of motion.  Large tracheal stoma still with thick, yellow, purulent appearing drainage. Dressing in place. Minimal phonation. Communicates with whisper and hand signals.  Cardiovascular: Normal rate and regular rhythm.  Pulmonary/Chest: Effort normal. He has decreased breath sounds in the right lower field and the left lower field. He has rhonchi.  Abdominal: Soft. Bowel sounds are normal. He exhibits no distension. There is no tenderness.  Musculoskeletal: He exhibits edema (1+ pedal edema.).  Bilateral fore and mid foot as well as toes are purple and cold to touch ("like it that way") +pedal pulses felt.  Neurological: He is alert and oriented to person, place, and time.  Dysphonic. Right facial droop, frontal droop, unable to close right eyelid. Impulsive and impatient. Able to follow  basic commands without difficulty. BUE with weakness and decrease in fine motor movements right greater than left. Strength grossly 3 to 3+ UE, 2+ to 3, LE's.  Skin: Skin is warm and dry.  Deep tissue injury of buttocks. Left lateral thigh with quarter size stage 2 ulcer. Stasis changes bilateral shins. Heels with dry skin.  Psych: more relaxed today.   Assessment/Plan: 1. 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 Physiatrist is providing close team supervision and 24 hour management of active medical problems listed below. Physiatrist and rehab team continue to assess barriers to discharge/monitor patient progress toward functional and medical goals. FIM: FIM - Bathing Bathing: 1: Total-Patient completes 0-2 of 10 parts or less than 25%  FIM - Upper Body Dressing/Undressing Upper body dressing/undressing: 0: Wears gown/pajamas-no public clothing FIM - Lower Body Dressing/Undressing Lower body dressing/undressing: 0: Wears Interior and spatial designer        FIM - Control and instrumentation engineer Devices: HOB elevated Bed/Chair Transfer: 4: Sit > Supine: Min A (steadying pt. > 75%/lift 1 leg);4: Chair or W/C > Bed: Min A (steadying Pt. > 75%);4: Bed > Chair or W/C: Min A (steadying Pt. > 75%)  FIM - Locomotion: Wheelchair Locomotion: Wheelchair: 1: Total Assistance/staff pushes wheelchair (Pt<25%) FIM - Locomotion: Ambulation Locomotion: Ambulation Assistive Devices: Administrator Ambulation/Gait Assistance: 4: Min assist Locomotion: Ambulation: 1: Two helpers (one to follow with chair)  Comprehension Comprehension Mode: Auditory Comprehension: 4-Understands basic 75 - 89% of the time/requires cueing 10 - 24% of the time  Expression Expression Mode:  Verbal Expression Assistive Devices: 6-Other (Comment) (paper and pencil) Expression: 2-Expresses basic 25 - 49% of the time/requires cueing 50 - 75% of the time. Uses single words/gestures.  Social Interaction Social Interaction: 3-Interacts appropriately 50 - 74% of the time - May be physically or verbally inappropriate.  Problem Solving Problem Solving Mode: Not assessed Problem Solving: 3-Solves basic 50 - 74% of the time/requires cueing 25 - 49% of the time  Memory Memory Mode: Not assessed Memory: 3-Recognizes or recalls 50 - 74% of the time/requires cueing 25 - 49% of the time  Medical Problem List and Plan:  1. DVT Prophylaxis/Anticoagulation: Mechanical: Antiembolism stockings, knee (TED hose) Bilateral lower extremities  Sequential compression devices, below knee Bilateral lower extremities.  Add lovenox.  Will check dopplers.  2. Pain Management: N/A.   prn ultram.  3. Mood: Provide ego support and redirection. LCSW to follow for evaluation.  4. Neuropsych: This patient is capable of making decisions on his/her own behalf.  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 Continue Lacrilube tid with patching at nights to prevent corneal abrasion. Eye patch at night. 6. CAD: continue atenolol bid. Patient has declined statin per reports.  7. DM type 2- diet controlled: Will monitor BS with AC/HS CBG checks. Use SSI for elevated BS. Currently 120-200 range likely due to stress and stress dose steroids. Fair control at present 8. Chronic LE edema: Low salt diet. Added TEDs.  9. Leukocytosis: urine culture negative. continue cipro for serratia tracheobronchitis 500mg  bid.   -recheck cbc today.  -dw ENT regarding other options.  8. HTN: Monitor with bid checks. Continue tenormin bid. Blood pressures have been variable and will adjust as needed.  9. Acute renal insufficiency: Due to dehydration? Encourage po fluid intake  LOS (Days) 3 A FACE TO FACE EVALUATION WAS  PERFORMED  SWARTZ,ZACHARY T 10/25/2012 7:36 AM

## 2012-10-25 NOTE — Progress Notes (Signed)
Subjective: Pt awake and responsive. Still has significant trach stoma secretion.   Objective: Vital signs in last 24 hours: Temp:  [97.3 F (36.3 C)-97.6 F (36.4 C)] 97.3 F (36.3 C) (03/07 0653) Pulse Rate:  [65-72] 72 (03/07 0653) Resp:  [20-22] 20 (03/07 0653) BP: (158-159)/(80-85) 159/85 mmHg (03/07 0653) SpO2:  [92 %-99 %] 96 % (03/07 0730)  Awake and responsive.  Able to communicate verbally and via gestures.  Right facial palsy noted. Incomplete eye closure.  Trach stoma still open. Mucoid secretion noted. No significant erythema or edema.   Recent Labs  10/23/12 1223 10/25/12 0700  WBC 23.1* 20.6*  HGB 16.5 14.4  HCT 48.0 44.9  PLT 374 359    Recent Labs  10/23/12 1223  NA 135  K 4.2  CL 95*  CO2 29  GLUCOSE 161*  BUN 26*  CREATININE 0.88  CALCIUM 10.0    Medications:  I have reviewed the patient's current medications. Scheduled: . albuterol  2.5 mg Nebulization Q6H  . antiseptic oral rinse  15 mL Mouth Rinse QID  . artificial tears   Right Eye Q8H  . atenolol  50 mg Oral BID  . chlorhexidine  15 mL Mouth Rinse BID  . ciprofloxacin  500 mg Oral BID  . enoxaparin (LOVENOX) injection  40 mg Subcutaneous Q24H  . feeding supplement  1 Container Oral TID BM  . feeding supplement  30 mL Oral TID WC  . glycopyrrolate  1 mg Oral BID  . guaiFENesin  1,200 mg Oral BID  . hydrocerin   Topical BID  . insulin aspart  0-15 Units Subcutaneous TID WC  . insulin aspart  0-5 Units Subcutaneous QHS  . insulin aspart  3 Units Subcutaneous TID WC  . ipratropium  0.5 mg Nebulization Q6H  . multivitamin with minerals  1 tablet Oral Daily  . saccharomyces boulardii  250 mg Oral BID   [id number], albuterol, alum & mag hydroxide-simeth, bisacodyl, diphenhydrAMINE, guaiFENesin-dextromethorphan, HYDROcodone-acetaminophen, naLOXone (NARCAN)  injection, prochlorperazine, prochlorperazine, prochlorperazine, senna, traMADol, traZODone  Assessment/Plan: 1. POD #14  s/p right AN resection. Postop CT shows good tumor resection and decompression.  2. Right facial palsy noted. Will need lacrilube to prevent corneal drying. Will consult oculoplastic for right eyelid gold weight implant.  3. Trach removed. Stoma healing. No other intervention needed. 4. Agree with Cipro coverage of his pulm secretion/possible pneumonia. Will follow.    LOS: 3 days   Russ Looper,SUI W 10/25/2012, 12:28 PM

## 2012-10-26 ENCOUNTER — Inpatient Hospital Stay (HOSPITAL_COMMUNITY): Payer: BC Managed Care – PPO

## 2012-10-26 ENCOUNTER — Inpatient Hospital Stay (HOSPITAL_COMMUNITY): Payer: BC Managed Care – PPO | Admitting: *Deleted

## 2012-10-26 ENCOUNTER — Inpatient Hospital Stay (HOSPITAL_COMMUNITY): Payer: BC Managed Care – PPO | Admitting: Physical Therapy

## 2012-10-26 ENCOUNTER — Inpatient Hospital Stay (HOSPITAL_COMMUNITY): Payer: BC Managed Care – PPO | Admitting: Occupational Therapy

## 2012-10-26 ENCOUNTER — Inpatient Hospital Stay (HOSPITAL_COMMUNITY): Payer: BC Managed Care – PPO | Admitting: Speech Pathology

## 2012-10-26 DIAGNOSIS — R269 Unspecified abnormalities of gait and mobility: Secondary | ICD-10-CM

## 2012-10-26 DIAGNOSIS — D333 Benign neoplasm of cranial nerves: Secondary | ICD-10-CM

## 2012-10-26 DIAGNOSIS — E669 Obesity, unspecified: Secondary | ICD-10-CM

## 2012-10-26 DIAGNOSIS — R5381 Other malaise: Secondary | ICD-10-CM

## 2012-10-26 DIAGNOSIS — G519 Disorder of facial nerve, unspecified: Secondary | ICD-10-CM

## 2012-10-26 DIAGNOSIS — J209 Acute bronchitis, unspecified: Secondary | ICD-10-CM

## 2012-10-26 LAB — GLUCOSE, CAPILLARY
Glucose-Capillary: 125 mg/dL — ABNORMAL HIGH (ref 70–99)
Glucose-Capillary: 132 mg/dL — ABNORMAL HIGH (ref 70–99)

## 2012-10-26 LAB — EXPECTORATED SPUTUM ASSESSMENT W GRAM STAIN, RFLX TO RESP C

## 2012-10-26 NOTE — Progress Notes (Signed)
Physical Therapy Note  Patient Details  Name: [name] MRN: [id number] Date of Birth: 1960/11/24 Today's Date: 10/26/2012  [id number] (55 minutes) individual Pain: no reported pain Focus of treatment: Therapeutic exercises focused on activity tolerance; gait training/endurance Other : Oxygen sats 99% pulse 69 (4 L  [place]) Treatment: Pt up in wc upon arrival; UE ergonometer X 3 minutes, X 7 minutes (Oxygen stats > 92% 3 L [place]); gait 28 feet X 2 RW min assist (Oxygen sats > 92% on 3 L [place] pulse 101/ 94); pt has wide stance with RT LE hitting AD during swing); wc mobility for activity tolerance- 43 feet min assist before c/o fatigue.  [id number] (30 minutes) individual (missed 15 minutes - breathing treatment) Pain: no reported pain Focus of treatment: Therapeutic exercises focused on bilateral LE strengthening/activity tolerance; car transfer training Treatment: Transfer with RW with vcs for brakes + assist with footrests; Nustep Level 4 X 10 minutes ( Oxygen sats > 92% on 3L [place]); car transfer - min assist for safety + vcs for hand placement sit to stand (not pull on car door). Zoila Ditullio,JIM 10/26/2012, 9:07 AM

## 2012-10-26 NOTE — Progress Notes (Signed)
Pt c/o SOB and difficulty breathing; O2 sats at 77% on 4L[place]; Respiratory therapist notified for prn albuterol treatment; Dr. Letta Pate notified.  Portable CXR, Sputum culture,  prn NTsuction to be added to current orders. Vaseline gauze dressing also ordered to be applied to stoma site. Will continue to monitor pt

## 2012-10-26 NOTE — Progress Notes (Signed)
Occupational Therapy Note  Patient Details  Name: [name] MRN: [id number] Date of Birth: 20-Mar-1961 Today's Date: 10/26/2012  Skilled Therapeutic Interventions/Progress Updates:  Pt seated in w/c upon arrival.  Agreed to work on independence in bathing and dressing tasks w/c level at sink. Pt required assistance with bathing feet. Pt would not agreed to don clothes this morning but would don clean hospital gown. Pt declined TED hose due to him being hot natured.  Pt performed sit<>stand with min assist and maintained standing at sink with minimal to wash periarea. O2 sats dropped to 75% on 3L O2 during activity but recovered to 98% within 60 secs of pursed lip breathing. Focus on activity tolerance, sit<>stand, standing balance, and safety awareness.    Lisa Roca 10/26/2012, 8:00 AM

## 2012-10-26 NOTE — Progress Notes (Signed)
Patient ID: [name], male   DOB: [birth year], 52 y.o.   MRN: [id number] Subjective/Complaints: Secretions are persistent. Afebrile.SOB at noc, reviewed ENT note Problems with SOB and desat last noc, sleeps in recliner at home, feels he was too flat Occ cough, appears comfortable now A 12 point review of systems has been performed and if not noted above is otherwise negative.   Objective: Vital Signs: Blood pressure 111/74, pulse 94, temperature 98.4 F (36.9 C), temperature source Axillary, resp. rate 22, weight 141.5 kg (311 lb 15.2 oz), SpO2 97.00%. Dg Chest Port 1 View  10/26/2012  *RADIOLOGY REPORT*  Clinical Data: Difficulty breathing and shortness of breath.  PORTABLE CHEST - 1 VIEW  Comparison: Chest radiograph performed 10/16/2012  Findings: The lungs are well-aerated.  Mild vascular congestion is noted.  Right-sided atelectasis is seen.  Underlying mild right basilar opacity may reflect mild pneumonia or residual interstitial edema.  No pleural effusion or pneumothorax is identified.  The cardiomediastinal silhouette is borderline normal in size; the patient is status post median sternotomy.  No acute osseous abnormalities are seen.  IMPRESSION: Mild vascular congestion noted.  Right-sided atelectasis seen. Underlying mild right basilar opacity may reflect mild pneumonia or residual interstitial edema.   Original Report Authenticated By: Santa Lighter, M.D.     Recent Labs  10/23/12 1223 10/25/12 0700  WBC 23.1* 20.6*  HGB 16.5 14.4  HCT 48.0 44.9  PLT 374 359    Recent Labs  10/23/12 1223  NA 135  K 4.2  CL 95*  GLUCOSE 161*  BUN 26*  CREATININE 0.88  CALCIUM 10.0   CBG (last 3)   Recent Labs  10/25/12 1705 10/25/12 2029 10/26/12 0748  GLUCAP 116* 134* 125*    Wt Readings from Last 3 Encounters:  10/22/12 141.5 kg (311 lb 15.2 oz)  10/21/12 149.7 kg (330 lb 0.5 oz)  10/21/12 149.7 kg (330 lb 0.5 oz)    Physical Exam:  Constitutional: He is  oriented to person, place, and time. He appears well-developed and well-nourished.  Morbidly obese HENT:  Head: Normocephalic and atraumatic.  Mouth/Throat: Abnormal dentition. Dental caries present. Oropharyngeal exudate present.  Neck: Normal range of motion.  Large tracheal stoma still with thick, yellow, purulent appearing drainage. Dressing in place. Minimal phonation. Communicates with whisper and hand signals.  Cardiovascular: Normal rate and regular rhythm.  Pulmonary/Chest: Effort normal. He has decreased breath sounds in the right lower field, no wheezes or rhonchi.  Abdominal: Soft. Bowel sounds are normal. He exhibits no distension. There is no tenderness.  Musculoskeletal: He exhibits edema (1+ pedal edema.).  Bilateral fore and mid foot as well as toes are purple and cold to touch ("like it that way") +pedal pulses felt.  Neurological: He is alert and oriented to person, place, and time.  Dysphonic. Right facial droop,lower facial and frontal droop, unable to close right eyelid. Impulsive and impatient. Able to follow basic commands without difficulty. BUE with weakness and decrease in fine motor movements right greater than left. Strength grossly 3 to 3+ UE, 2+ to 3, LE's.  Skin: Skin is warm and dry.  Deep tissue injury of buttocks. Left lateral thigh with quarter size stage 2 ulcer. Stasis changes bilateral shins. Heels with dry skin.  Psych: more relaxed today.   Assessment/Plan: 1. 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 Physiatrist is  providing close team supervision and 24 hour management of active medical problems listed below. Physiatrist and rehab team continue to assess barriers to discharge/monitor patient progress toward functional and medical goals. FIM: FIM -  Bathing Bathing Steps Patient Completed: Chest;Right Arm;Left Arm;Abdomen;Front perineal area;Right upper leg;Left upper leg Bathing: 3: Mod-Patient completes 5-7 76f 10 parts or 50-74%  FIM - Upper Body Dressing/Undressing Upper body dressing/undressing: 0: Wears gown/pajamas-no public clothing FIM - Lower Body Dressing/Undressing Lower body dressing/undressing: 0: Wears Interior and spatial designer        FIM - Control and instrumentation engineer Devices: HOB elevated Bed/Chair Transfer: 4: Supine > Sit: Min A (steadying Pt. > 75%/lift 1 leg);4: Bed > Chair or W/C: Min A (steadying Pt. > 75%)  FIM - Locomotion: Wheelchair Locomotion: Wheelchair: 1: Travels less than 50 ft with minimal assistance (Pt.>75%) FIM - Locomotion: Ambulation Locomotion: Ambulation Assistive Devices: Administrator Ambulation/Gait Assistance: 4: Min assist Locomotion: Ambulation: 1: Two helpers  Comprehension Comprehension Mode: Auditory Comprehension: 4-Understands basic 75 - 89% of the time/requires cueing 10 - 24% of the time  Expression Expression Mode: Verbal Expression Assistive Devices: 6-Other (Comment) (paper and pencil) Expression: 2-Expresses basic 25 - 49% of the time/requires cueing 50 - 75% of the time. Uses single words/gestures.  Social Interaction Social Interaction: 3-Interacts appropriately 50 - 74% of the time - May be physically or verbally inappropriate.  Problem Solving Problem Solving Mode: Not assessed Problem Solving: 3-Solves basic 50 - 74% of the time/requires cueing 25 - 49% of the time  Memory Memory Mode: Not assessed Memory: 3-Recognizes or recalls 50 - 74% of the time/requires cueing 25 - 49% of the time  Medical Problem List and Plan:  1. DVT Prophylaxis/Anticoagulation: Mechanical: Antiembolism stockings, knee (TED hose) Bilateral lower extremities  Sequential compression devices, below knee Bilateral lower extremities.  Add lovenox.  Will  check dopplers.  2. Pain Management: N/A.   prn ultram.  3. Mood: Provide ego support and redirection. LCSW to follow for evaluation.  4. Neuropsych: This patient is capable of making decisions on his/her own behalf.  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 Continue Lacrilube tid with patching at nights to prevent corneal abrasion. Eye patch at night. ?oculoplastics for gold pellet in eyelid 6. CAD: continue atenolol bid. Patient has declined statin per reports.  7. DM type 2- diet controlled: Will monitor BS with AC/HS CBG checks. Use SSI for elevated BS. Currently 120-200 range likely due to stress and stress dose steroids. Fair control at present 8. Chronic LE edema: Low salt diet. Added TEDs.  9. Leukocytosis: urine culture negative. continue cipro for serratia tracheobronchitis 500mg  bid.   -monitor cbc and temp.If pt declines clinically or has temp would change to IV abx  -dw ENT regarding other options.  8. HTN: Monitor with bid checks. Continue tenormin bid. Blood pressures have been variable and will adjust as needed.  9. Acute renal insufficiency: Due to dehydration? Encourage po fluid intake  LOS (Days) 4 A FACE TO FACE EVALUATION WAS PERFORMED  KIRSTEINS,ANDREW E 10/26/2012 9:03 AM

## 2012-10-26 NOTE — Progress Notes (Signed)
Speech Language Pathology Daily Session Note  Patient Details  Name: [name] MRN: [id number] Date of Birth: 02/17/1961  Today's Date: 10/26/2012 Time: 1005-1035 Time Calculation (min): 30 min  Short Term Goals: Week 1: SLP Short Term Goal 1 (Week 1): Pt will apply pressure to stoma to increase vocal intensity at the word level with Mod A verbal and visual cues.  SLP Short Term Goal 2 (Week 1): Pt will utilize swallowing compensatory strategies to minimize overt s/s of aspiration with Mod A verbal and tactile cues.  SLP Short Term Goal 3 (Week 1): Pt will demonstrate sustained attention to functional tasks for 15 minutes with Mod A verbal cues for redirection SLP Short Term Goal 4 (Week 1): Pt will recall newly learned information with Mod A question and visual cues.   Skilled Therapeutic Interventions: Treatment session focused on continued instruction/training and cues for patient to effectively apply pressure to covered (bandage) stoma to allow for phonation at phoneme and one-two word level. RN cleaned stoma area prior to session, secondary to patient continues with copious amount of secretions from stoma. Patient indicated that the stoma area is sore, which partly is why he does not like to apply pressure to phonate. Therapist instructed and directed patient in breathing in through his nose, and directing air out through his mouth, and applying gentle pressure to covered stoma for voicing at phoneme and word levels and to increase pressure when coughing. Patient able to direct air through mouth, cough and phonate at 1-2 word level when encouraged and cued . He did communicate partially through writing , which although his handwriting quality was poor, therapist was able to decipher via asking patient questions to confirm, and written language appeared intact. (disregarding spelling errors). Patient consumed water through his blue cup with sip-top lid with no overt s/s aspiration. When he  was attempting this independently, he exhibited frequent anteior spillage, however following therapist's instructions and cues, he return-demonstrated effectively , taking small sips and directing liquid to left side, which significantly reduced anterior spillage.    FIM:  Comprehension Comprehension Mode: Auditory Comprehension: 5-Understands basic 90% of the time/requires cueing < 10% of the time Expression Expression Mode: Verbal;Nonverbal Expression Assistive Devices: 6-Other (Comment) Expression: 2-Expresses basic 25 - 49% of the time/requires cueing 50 - 75% of the time. Uses single words/gestures. (apply presure to occulde stoma for voicing) Social Interaction Social Interaction: 4-Interacts appropriately 75 - 89% of the time - Needs redirection for appropriate language or to initiate interaction. Problem Solving Problem Solving: 3-Solves basic 50 - 74% of the time/requires cueing 25 - 49% of the time Memory Memory: 3-Recognizes or recalls 50 - 74% of the time/requires cueing 25 - 49% of the time  Pain Pain Assessment Pain Assessment: No/denies pain Pain Score: 0-No pain  Therapy/Group: Individual Therapy  Dannial Monarch 10/26/2012, 12:13 PM  Sonia Baller, MA, CCC-SLP [facility] Speech-Language Pathologist

## 2012-10-27 ENCOUNTER — Inpatient Hospital Stay (HOSPITAL_COMMUNITY): Payer: BC Managed Care – PPO | Admitting: Physical Therapy

## 2012-10-27 LAB — GLUCOSE, CAPILLARY
Glucose-Capillary: 116 mg/dL — ABNORMAL HIGH (ref 70–99)
Glucose-Capillary: 118 mg/dL — ABNORMAL HIGH (ref 70–99)
Glucose-Capillary: 125 mg/dL — ABNORMAL HIGH (ref 70–99)
Glucose-Capillary: 197 mg/dL — ABNORMAL HIGH (ref 70–99)

## 2012-10-27 MED ORDER — SENNOSIDES-DOCUSATE SODIUM 8.6-50 MG PO TABS
2.0000 | ORAL_TABLET | Freq: Two times a day (BID) | ORAL | Status: DC
  Administered 2012-10-27 – 2012-11-05 (×17): 2 via ORAL
  Administered 2012-11-05: 1 via ORAL
  Administered 2012-11-06 – 2012-11-08 (×6): 2 via ORAL
  Administered 2012-11-09 – 2012-11-10 (×2): 1 via ORAL
  Administered 2012-11-11 (×2): 2 via ORAL
  Administered 2012-11-12: 1 via ORAL
  Filled 2012-10-27 (×29): qty 2

## 2012-10-27 MED ORDER — TAMSULOSIN HCL 0.4 MG PO CAPS
0.4000 mg | ORAL_CAPSULE | Freq: Every day | ORAL | Status: DC
  Administered 2012-10-27 – 2012-11-11 (×16): 0.4 mg via ORAL
  Filled 2012-10-27 (×17): qty 1

## 2012-10-27 MED ORDER — ALBUTEROL SULFATE (5 MG/ML) 0.5% IN NEBU
2.5000 mg | INHALATION_SOLUTION | Freq: Two times a day (BID) | RESPIRATORY_TRACT | Status: DC
  Administered 2012-10-27 – 2012-11-08 (×22): 2.5 mg via RESPIRATORY_TRACT
  Filled 2012-10-27 (×21): qty 0.5

## 2012-10-27 MED ORDER — IPRATROPIUM BROMIDE 0.02 % IN SOLN
0.5000 mg | Freq: Two times a day (BID) | RESPIRATORY_TRACT | Status: DC
  Administered 2012-10-27 – 2012-11-08 (×22): 0.5 mg via RESPIRATORY_TRACT
  Filled 2012-10-27 (×21): qty 2.5

## 2012-10-27 NOTE — Progress Notes (Signed)
Complains of pain to right side of head, pain managed with PRN vicodin. Rating pain 8/10, "Aching" pain. Used ice pack to right shoulder/head. Trach stoma dressing changed with yellow/tan drainage around stoma and on old dressing. Using [place] independently. O2 at 4L/M via [place]-keeping sats at mid to high 90's. Trach stoma open with air leakage. Refused scheduled mouth washes, opthalmic oint., PRN laxatives and eye patch. Drooling and spillage of liquids from right side of mouth. [name] A

## 2012-10-27 NOTE — Progress Notes (Signed)
1600 Pt. Progressing; utilizing incentive spirometer hourly.  Refuses to void in urinal and insists on condom cath at all times.  Flomax prescribed by MD for urinary retention.  Nursing recommendation to monitor urinary output every 8 hours with bladder scans and as needed.

## 2012-10-27 NOTE — Progress Notes (Signed)
Patient ID: [name], male   DOB: February 25, 1961, 52 y.o.   MRN: [id number] Subjective/Complaints: Secretions are reduced, using IS regularly, condom cath fell off. Less Problems with SOB and desat last noc, sleeps in recliner at home,HOB raised to 60 degrees , appears comfortable now A 12 point review of systems has been performed and if not noted above is otherwise negative.   Objective: Vital Signs: Blood pressure 140/80, pulse 68, temperature 98.2 F (36.8 C), temperature source Oral, resp. rate 18, weight 141.5 kg (311 lb 15.2 oz), SpO2 96.00%. Dg Chest Port 1 View  10/26/2012  *RADIOLOGY REPORT*  Clinical Data: Difficulty breathing and shortness of breath.  PORTABLE CHEST - 1 VIEW  Comparison: Chest radiograph performed 10/16/2012  Findings: The lungs are well-aerated.  Mild vascular congestion is noted.  Right-sided atelectasis is seen.  Underlying mild right basilar opacity may reflect mild pneumonia or residual interstitial edema.  No pleural effusion or pneumothorax is identified.  The cardiomediastinal silhouette is borderline normal in size; the patient is status post median sternotomy.  No acute osseous abnormalities are seen.  IMPRESSION: Mild vascular congestion noted.  Right-sided atelectasis seen. Underlying mild right basilar opacity may reflect mild pneumonia or residual interstitial edema.   Original Report Authenticated By: Santa Lighter, M.D.     Recent Labs  10/25/12 0700  WBC 20.6*  HGB 14.4  HCT 44.9  PLT 359   No results found for this basename: NA, K, CL, CO, GLUCOSE, BUN, CREATININE, CALCIUM,  in the last 72 hours CBG (last 3)   Recent Labs  10/26/12 1700 10/26/12 2023 10/27/12 0730  GLUCAP 132* 120* 116*    Wt Readings from Last 3 Encounters:  10/22/12 141.5 kg (311 lb 15.2 oz)  10/21/12 149.7 kg (330 lb 0.5 oz)  10/21/12 149.7 kg (330 lb 0.5 oz)    Physical Exam:  Constitutional: He is oriented to person, place, and time. He appears  well-developed and well-nourished.  Morbidly obese HENT:  Head: Normocephalic and atraumatic.  Mouth/Throat: Abnormal dentition. Dental caries present. Oropharyngeal exudate present.  Neck: Normal range of motion.  Large tracheal stoma still with thick, yellow,  appearing drainage. Dressing in place. Minimal phonation. Communicates with whisper and hand signals.  Cardiovascular: Normal rate and regular rhythm.  Pulmonary/Chest: Effort normal. He has decreased breath sounds in the right lower field, no wheezes or rhonchi.  Abdominal: Soft. Bowel sounds are normal. He exhibits no distension. There is no tenderness.  Musculoskeletal: He exhibits edema (1+ pedal edema.).  Bilateral fore and mid foot as well as toes are pink  +pedal pulses felt.  Neurological: He is alert and oriented to person, place, and time.  Dysphonic. Right facial droop,lower facial and frontal droop, unable to close right eyelid. Impulsive and impatient. Able to follow basic commands without difficulty. BUE with weakness and decrease in fine motor movements right greater than left. Strength grossly 3 to 3+ UE, 2+ to 3, LE's.  Skin: Skin is warm and dry.   Stasis changes bilateral shins. Heels with dry skin.  Psych: more relaxed today.   Assessment/Plan: 1. 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 Physiatrist is providing close team supervision and 24 hour management of active medical problems listed below. Physiatrist and rehab team continue to assess barriers to discharge/monitor patient progress toward functional and medical goals. FIM: FIM -  Bathing Bathing Steps Patient Completed: Chest;Right Arm;Left Arm;Abdomen;Front perineal area;Buttocks;Right upper leg;Left upper leg Bathing: 4: Min-Patient  completes 8-9 54f 10 parts or 75+ percent  FIM - Upper Body Dressing/Undressing Upper body dressing/undressing: 0: Wears gown/pajamas-no public clothing FIM - Lower Body Dressing/Undressing Lower body dressing/undressing: 0: Wears Interior and spatial designer        FIM - Control and instrumentation engineer Devices: HOB elevated Bed/Chair Transfer: 4: Supine > Sit: Min A (steadying Pt. > 75%/lift 1 leg);4: Bed > Chair or W/C: Min A (steadying Pt. > 75%)  FIM - Locomotion: Wheelchair Locomotion: Wheelchair: 1: Travels less than 50 ft with minimal assistance (Pt.>75%) FIM - Locomotion: Ambulation Locomotion: Ambulation Assistive Devices: Administrator Ambulation/Gait Assistance: 4: Min assist Locomotion: Ambulation: 1: Two helpers  Comprehension Comprehension Mode: Auditory Comprehension: 4-Understands basic 75 - 89% of the time/requires cueing 10 - 24% of the time  Expression Expression Mode: Verbal Expression Assistive Devices: 6-Other (Comment) Expression: 2-Expresses basic 25 - 49% of the time/requires cueing 50 - 75% of the time. Uses single words/gestures.  Social Interaction Social Interaction: 3-Interacts appropriately 50 - 74% of the time - May be physically or verbally inappropriate.  Problem Solving Problem Solving Mode: Not assessed Problem Solving: 3-Solves basic 50 - 74% of the time/requires cueing 25 - 49% of the time  Memory Memory Mode: Not assessed Memory: 3-Recognizes or recalls 50 - 74% of the time/requires cueing 25 - 49% of the time  Medical Problem List and Plan:  1. DVT Prophylaxis/Anticoagulation: Mechanical: Antiembolism stockings, knee (TED hose) Bilateral lower extremities  Sequential compression devices, below knee Bilateral lower extremities.  Add lovenox.  Will check dopplers.  2. Pain Management: N/A.   prn ultram.  3. Mood: Provide ego support and redirection. LCSW to follow for evaluation.  4. Neuropsych: This patient is  capable of making decisions on his/her own behalf.  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 Continue Lacrilube tid with patching at nights to prevent corneal abrasion. Eye patch at night. ?oculoplastics for gold pellet in eyelid 6. CAD: continue atenolol bid. Patient has declined statin per reports.  7. DM type 2- diet controlled: Will monitor BS with AC/HS CBG checks. Use SSI for elevated BS. Currently 120-200 range likely due to stress and stress dose steroids. Fair control at present 8. Chronic LE edema: Low salt diet. Added TEDs.  9. Leukocytosis: urine culture negative. continue cipro for serratia tracheobronchitis 500mg  bid.   -monitor cbc and temp.If pt declines clinically or has temp would change to IV abx  -dw ENT regarding other options.  8. HTN: Monitor with bid checks. Continue tenormin bid. Blood pressures have been variable and will adjust as needed.  9. Acute renal insufficiency: resolved  LOS (Days) 5 A FACE TO FACE EVALUATION WAS PERFORMED  Charlett Blake 10/27/2012 9:17 AM

## 2012-10-27 NOTE — Progress Notes (Signed)
Physical Therapy Note  Patient Details  Name: [name] MRN: [id number] Date of Birth: [birth year] Today's Date: 10/27/2012  1000-1030 (30 minutes) missed 30 minutes secondary to nursing issues  Pain: no reported pain Focus of treatment: Gait training/endurance Treatment: Upon entering room pt attempting to use urinal in sitting. Pt discouraged with no be able to use urinal and threw it in wastebasket. Nurse notified and scanned bladder . Pt cathed . Pt agreed to finish PT session. Gait 33 feet X 2 RW SBA. Oxygen sats > 92% on 3 L [place] during session.     HOUT,JIM 10/27/2012, 10:58 AM

## 2012-10-28 ENCOUNTER — Inpatient Hospital Stay (HOSPITAL_COMMUNITY): Payer: BC Managed Care – PPO | Admitting: Speech Pathology

## 2012-10-28 ENCOUNTER — Inpatient Hospital Stay (HOSPITAL_COMMUNITY): Payer: BC Managed Care – PPO

## 2012-10-28 ENCOUNTER — Inpatient Hospital Stay (HOSPITAL_COMMUNITY): Payer: BC Managed Care – PPO | Admitting: Physical Therapy

## 2012-10-28 DIAGNOSIS — D333 Benign neoplasm of cranial nerves: Secondary | ICD-10-CM

## 2012-10-28 DIAGNOSIS — Z930 Tracheostomy status: Secondary | ICD-10-CM

## 2012-10-28 DIAGNOSIS — R5381 Other malaise: Secondary | ICD-10-CM

## 2012-10-28 DIAGNOSIS — B9689 Other specified bacterial agents as the cause of diseases classified elsewhere: Secondary | ICD-10-CM

## 2012-10-28 DIAGNOSIS — G519 Disorder of facial nerve, unspecified: Secondary | ICD-10-CM

## 2012-10-28 DIAGNOSIS — J209 Acute bronchitis, unspecified: Secondary | ICD-10-CM

## 2012-10-28 DIAGNOSIS — E669 Obesity, unspecified: Secondary | ICD-10-CM

## 2012-10-28 DIAGNOSIS — R269 Unspecified abnormalities of gait and mobility: Secondary | ICD-10-CM

## 2012-10-28 LAB — CULTURE, RESPIRATORY W GRAM STAIN

## 2012-10-28 LAB — GLUCOSE, CAPILLARY

## 2012-10-28 MED ORDER — DEXTROSE 5 % IV SOLN
1.0000 g | Freq: Three times a day (TID) | INTRAVENOUS | Status: DC
  Administered 2012-10-28 – 2012-11-04 (×20): 1 g via INTRAVENOUS
  Filled 2012-10-28 (×26): qty 1

## 2012-10-28 MED ORDER — DEXTROSE 5 % IV SOLN
1.0000 g | Freq: Three times a day (TID) | INTRAVENOUS | Status: DC
  Administered 2012-10-28: 1 g via INTRAVENOUS
  Filled 2012-10-28 (×3): qty 1

## 2012-10-28 MED ORDER — BACITRACIN-NEOMYCIN-POLYMYXIN OINTMENT TUBE
TOPICAL_OINTMENT | Freq: Three times a day (TID) | CUTANEOUS | Status: DC
  Administered 2012-10-29 – 2012-11-11 (×34): via TOPICAL
  Filled 2012-10-28: qty 15

## 2012-10-28 MED ORDER — BACITRACIN-NEOMYCIN-POLYMYXIN OINTMENT TUBE
TOPICAL_OINTMENT | Freq: Three times a day (TID) | CUTANEOUS | Status: DC
  Administered 2012-10-28 – 2012-11-01 (×5): via TOPICAL
  Filled 2012-10-28: qty 15

## 2012-10-28 NOTE — Progress Notes (Signed)
Speech Language Pathology Daily Session Note  Patient Details  Name: [name] MRN: [id number] Date of Birth: Dec 25, 1960  Today's Date: 10/28/2012 Time: [id number] Time Calculation (min): 30 min  Short Term Goals: Week 1: SLP Short Term Goal 1 (Week 1): Pt will apply pressure to stoma to increase vocal intensity at the word level with Mod A verbal and visual cues.  SLP Short Term Goal 2 (Week 1): Pt will utilize swallowing compensatory strategies to minimize overt s/s of aspiration with Mod A verbal and tactile cues.  SLP Short Term Goal 3 (Week 1): Pt will demonstrate sustained attention to functional tasks for 15 minutes with Mod A verbal cues for redirection SLP Short Term Goal 4 (Week 1): Pt will recall newly learned information with Mod A question and visual cues.   Skilled Therapeutic Interventions: Treatment session focused on speech goals. SLP facilitated session by providing Max A verbal, visual, tactile and demonstration cues for patient to effectively apply pressure to covered stoma to allow for phonation at phoneme and one word level. Pt demonstrated a harsh vocal quality and required Max encouragement due to poor frustration tolerance. Pt continues to demonstrate poor carryover of information and requires re-education on a daily basis.    FIM:  Comprehension Comprehension Mode: Auditory Comprehension: 4-Understands basic 75 - 89% of the time/requires cueing 10 - 24% of the time Expression Expression Mode: Verbal Expression: 2-Expresses basic 25 - 49% of the time/requires cueing 50 - 75% of the time. Uses single words/gestures. Problem Solving Problem Solving: 3-Solves basic 50 - 74% of the time/requires cueing 25 - 49% of the time Memory Memory: 3-Recognizes or recalls 50 - 74% of the time/requires cueing 25 - 49% of the time  Pain Pain Assessment Pain Assessment: 0-10 Pain Score: 10-Worst pain ever Pain Type: Acute pain Pain Location: Head Pain Orientation:  Right Pain Descriptors: Headache Pain Frequency: Occasional Pain Onset: Gradual Patients Stated Pain Goal: 3 Pain Intervention(s): Medication (See eMAR) (vicodin 1 po)  Therapy/Group: Individual Therapy  PAYNE, COURTNEY 10/28/2012, 4:28 PM

## 2012-10-28 NOTE — Progress Notes (Signed)
Using condom cath at [facility]. Urine tea colored. Continues with anterior spillage with PO intake. BLE edema, discolored from shin down, with pitting edema. [name]

## 2012-10-28 NOTE — Progress Notes (Signed)
Patient ID: SHAFIQ MAHLER, male   DOB: 12-18-1960, 52 y.o.   MRN: [id number] Subjective/Complaints: Still with a lot of secretions from stoma. No other problems noted.  A 12 point review of systems has been performed and if not noted above is otherwise negative.   Objective: Vital Signs: Blood pressure 141/85, pulse 72, temperature 97.4 F (36.3 C), temperature source Oral, resp. rate 16, weight 141.5 kg (311 lb 15.2 oz), SpO2 98.00%. No results found. No results found for this basename: WBC, HGB, HCT, PLT,  in the last 72 hours No results found for this basename: NA, K, CL, CO, GLUCOSE, BUN, CREATININE, CALCIUM,  in the last 72 hours CBG (last 3)   Recent Labs  10/27/12 1704 10/27/12 2114 10/28/12 0729  GLUCAP 197* 119* 126*    Wt Readings from Last 3 Encounters:  10/22/12 141.5 kg (311 lb 15.2 oz)  10/21/12 149.7 kg (330 lb 0.5 oz)  10/21/12 149.7 kg (330 lb 0.5 oz)    Physical Exam:  Constitutional: He is oriented to person, place, and time. He appears well-developed and well-nourished.  Morbidly obese HENT:  Head: Normocephalic and atraumatic.  Mouth/Throat: Abnormal dentition. Dental caries present. Oropharyngeal exudate present.  Neck: Normal range of motion.  Large tracheal stoma still with persistent thick, yellow,  appearing drainage. Dressing in place. Minimal phonation. Communicates with whisper and hand signals.  Cardiovascular: Normal rate and regular rhythm.  Pulmonary/Chest: Effort normal.   no wheezes or rhonchi.  Abdominal: Soft. Bowel sounds are normal. He exhibits no distension. There is no tenderness.  Musculoskeletal: He exhibits edema (1+ pedal edema.).  Bilateral fore and mid foot as well as toes are pink  +pedal pulses felt.  Neurological: He is alert and oriented to person, place, and time.  Dysphonic. Right facial droop,lower facial and frontal droop, unable to close right eyelid. Impulsive and impatient. Able to follow basic commands without  difficulty. BUE with weakness and decrease in fine motor movements right greater than left. Strength grossly 3 to 3+ UE, 2+ to 3, LE's.  Skin: Skin is warm and dry.   Stasis changes bilateral shins. Heels with dry skin.  Psych: more relaxed   Assessment/Plan: 1. 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 Physiatrist is providing close team supervision and 24 hour management of active medical problems listed below. Physiatrist and rehab team continue to assess barriers to discharge/monitor patient progress toward functional and medical goals. FIM: FIM - Bathing Bathing Steps Patient Completed: Chest;Right Arm;Left Arm;Abdomen;Front perineal area;Buttocks;Right upper leg;Left upper leg Bathing: 4: Min-Patient completes 8-9 57f 10 parts or 75+ percent  FIM - Upper Body Dressing/Undressing Upper body dressing/undressing: 0: Wears gown/pajamas-no public clothing FIM - Lower Body Dressing/Undressing Lower body dressing/undressing: 0: Wears Interior and spatial designer        FIM - Control and instrumentation engineer Devices: HOB elevated Bed/Chair Transfer: 4: Supine > Sit: Min A (steadying Pt. > 75%/lift 1 leg);4: Bed > Chair or W/C: Min A (steadying Pt. > 75%)  FIM - Locomotion: Wheelchair Locomotion: Wheelchair: 1: Travels less than 50 ft with minimal assistance (Pt.>75%) FIM - Locomotion: Ambulation Locomotion: Ambulation Assistive Devices: Administrator Ambulation/Gait Assistance: 4: Min assist Locomotion: Ambulation: 1: Two helpers  Comprehension Comprehension Mode: Auditory Comprehension: 4-Understands basic 75 - 89% of the time/requires cueing 10 - 24% of the time  Expression Expression Mode: Verbal Expression Assistive Devices: 6-Other  (Comment) Expression: 2-Expresses basic 25 - 49% of the time/requires cueing 50 - 75% of the time. Uses single words/gestures.  Social Interaction Social Interaction: 3-Interacts appropriately 50 - 74% of the time - May be physically or verbally inappropriate.  Problem Solving Problem Solving Mode: Not assessed Problem Solving: 3-Solves basic 50 - 74% of the time/requires cueing 25 - 49% of the time  Memory Memory Mode: Not assessed Memory: 3-Recognizes or recalls 50 - 74% of the time/requires cueing 25 - 49% of the time  Medical Problem List and Plan:  1. DVT Prophylaxis/Anticoagulation: Mechanical: Antiembolism stockings, knee (TED hose) Bilateral lower extremities  Sequential compression devices, below knee Bilateral lower extremities.  Add lovenox.  Will check dopplers.  2. Pain Management: N/A.   prn ultram.  3. Mood: Provide ego support and redirection. LCSW to follow for evaluation.  4. Neuropsych: This patient is capable of making decisions on his/her own behalf.  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 Continue Lacrilube tid with patching at nights to prevent corneal abrasion. Eye patch at night. ?oculoplastics for gold pellet in eyelid 6. CAD: continue atenolol bid. Patient has declined statin per reports.  7. DM type 2- diet controlled: Will monitor BS with AC/HS CBG checks. Use SSI for elevated BS. Currently 120-200 range likely due to stress and stress dose steroids. Fair control at present 8. Chronic LE edema: Low salt diet. Added TEDs.  9. Leukocytosis: urine culture negative. Given continued secretions, will change to iv abx.   -dw ENTtoday regarding other options.  8. HTN: Monitor with bid checks. Continue tenormin bid. Blood pressures have been variable and will adjust as needed.  9. Acute renal insufficiency: resolved  LOS (Days) 6 A FACE TO FACE EVALUATION WAS PERFORMED  SWARTZ,ZACHARY T 10/28/2012 8:08 AM

## 2012-10-28 NOTE — Progress Notes (Signed)
Subjective: No significant change.  Awake and alert.  Still has significant drainage from trach stoma.    Objective: Vital signs in last 24 hours: Temp:  [97.4 F (36.3 C)-97.8 F (36.6 C)] 97.8 F (36.6 C) (03/10 1410) Pulse Rate:  [59-72] 71 (03/10 2128) Resp:  [16-18] 17 (03/10 2035) BP: (137-144)/(77-85) 144/77 mmHg (03/10 2128) SpO2:  [95 %-98 %] 95 % (03/10 2042) FiO2 (%):  [96 %] 96 % (03/10 0117)  Awake and responsive.  Able to communicate verbally and via gestures.  Right facial palsy noted. Incomplete eye closure.  Trach stoma still open. Mucopurulent secretion noted. No significant erythema or edema.  No results found for this basename: WBC, HGB, HCT, PLT,  in the last 72 hours No results found for this basename: NA, K, CL, CO2, GLUCOSE, BUN, CREATININE, CALCIUM,  in the last 72 hours  Medications:  I have reviewed the patient's current medications. Scheduled: . albuterol  2.5 mg Nebulization BID  . antiseptic oral rinse  15 mL Mouth Rinse QID  . artificial tears   Right Eye Q8H  . atenolol  50 mg Oral BID  . cefTAZidime (FORTAZ)  IV  1 g Intravenous Q8H  . chlorhexidine  15 mL Mouth Rinse BID  . enoxaparin (LOVENOX) injection  40 mg Subcutaneous Q24H  . feeding supplement  1 Container Oral TID BM  . feeding supplement  30 mL Oral TID WC  . glycopyrrolate  1 mg Oral BID  . guaiFENesin  1,200 mg Oral BID  . hydrocerin   Topical BID  . insulin aspart  0-15 Units Subcutaneous TID WC  . insulin aspart  0-5 Units Subcutaneous QHS  . insulin aspart  3 Units Subcutaneous TID WC  . ipratropium  0.5 mg Nebulization BID  . multivitamin with minerals  1 tablet Oral Daily  . neomycin-bacitracin-polymyxin   Topical TID  . neomycin-bacitracin-polymyxin   Topical TID  . saccharomyces boulardii  250 mg Oral BID  . senna-docusate  2 tablet Oral BID  . tamsulosin  0.4 mg Oral QPC supper    Assessment/Plan: 1. POD #17 s/p right AN resection. Postop CT shows good tumor  resection and decompression.  2. Right facial palsy noted. Will need lacrilube to prevent corneal drying.   3. Trach removed. Continues to have mucopurulent drainage.  CXR shows no pneumonia or significant pulm disease.  4. Continue abx coverage. 5. Will close stoma at bedside under local anesthesia.      LOS: 6 days   Rossi Silvestro,SUI W 10/28/2012, 9:57 PM

## 2012-10-28 NOTE — Consult Note (Signed)
PULMONARY  / CRITICAL CARE MEDICINE  Name: [name] MRN: [id number] DOB: 05-15-1961    ADMISSION DATE:  10/22/2012 CONSULTATION DATE:  10/28/2012  REFERRING MD :  Dr. Benjamine Mola, ENT.  CHIEF COMPLAINT:  Purulent material from trach stoma.  BRIEF PATIENT DESCRIPTION: 52 year old with history of acoustic neuroma who underwent a resection that due to his size required that he undergo a tracheostomy.  Patient was decannulated after and has been in rehab.  While in rehab, stoma failed to heal properly and the patient continued to have purulent material exuding from the stoma.  Material grew serratia and patient was maintained on ceftaz with little improvement.  PCCM was consulted to evaluate if a bronchoscopy would be of help in control of secretion.  Denies fever/chills/N/V/SOB but does report cough and sputum production and no chest pain.  LINES / TUBES: PIV  CULTURES: Srratia in stoma secretion date unknown. Sputum from 3/8 x2 with mixed flora..  ANTIBIOTICS: Ceftaz start date unknown>>>  PAST MEDICAL HISTORY :  Past Medical History  Diagnosis Date  . CAD (coronary artery disease)     Cath 09, 99% LAD  . Cardiomyopathy, ischemic     Ischemic  . Obesity   . Fibromyalgia   . Shortness of breath   . CHF (congestive heart failure)   . Hypertension     dr Percival Spanish  . Cyst near coccyx   . Diabetes mellitus without complication     borderline  . Cyst near tailbone   . Kidney stones   . Myocardial infarction   . Headache     with lights   Past Surgical History  Procedure Laterality Date  . Coronary artery bypass graft      LIMA to LAD 2009  . Umbilical hernia repair    . Anal fistulectomy    . Retrosigmoid craniectomy for tumor resection Right 10/11/2012    Procedure: RETROSIGMOID CRANIECTOMY FOR TUMOR RESECTION;  Surgeon: [name], MD;  Location: [facility];  Service: Neurosurgery;  Laterality: Right;  Craniotomy for acoustic neuroma  . Tracheostomy tube placement  N/A 10/11/2012    Procedure: TRACHEOSTOMY;  Surgeon: Ascencion Dike, MD;  Location: [facility];  Service: ENT;  Laterality: N/A;  . Acoustic neuroma resection N/A 10/11/2012    Procedure: ACOUSTIC NEUROMA RESECTION;  Surgeon: Ascencion Dike, MD;  Location: [facility];  Service: ENT;  Laterality: N/A;   Prior to Admission medications   Medication Sig Start Date End Date Taking? Authorizing Provider  atenolol (TENORMIN) 25 MG tablet Take 25 mg by mouth 2 (two) times daily.   Yes Historical Provider, MD  HYDROcodone-acetaminophen (VICODIN) 5-500 MG per tablet Take 1 tablet by mouth every 6 (six) hours as needed. For pain   Yes Historical Provider, MD  albuterol (PROVENTIL) (5 MG/ML) 0.5% nebulizer solution Take 0.5 mLs (2.5 mg total) by nebulization every 6 (six) hours. 10/22/12   [name], MD  albuterol (PROVENTIL) (5 MG/ML) 0.5% nebulizer solution Take 0.5 mLs (2.5 mg total) by nebulization every 3 (three) hours as needed for wheezing or shortness of breath. 10/22/12   [name], MD  antiseptic oral rinse (BIOTENE) LIQD 15 mLs by Mouth Rinse route QID. 10/22/12   [name], MD  artificial tears (LACRILUBE) OINT ophthalmic ointment Apply to eye every 8 (eight) hours. 10/22/12   [name], MD  chlorhexidine (PERIDEX) 0.12 % solution Use as directed 15 mLs in the mouth or throat 2 (  two) times daily. 10/22/12   [name], MD   Allergies  Allergen Reactions  . Morphine And Related Anaphylaxis    "makes me stop breathing"  . Other     Steroids makes hearts race    FAMILY HISTORY:  Family History  Problem Relation Age of Onset  . CAD Father    SOCIAL HISTORY:  reports that he has been smoking Cigarettes.  He has a 10 pack-year smoking history. He uses smokeless tobacco. He reports that he does not drink alcohol or use illicit drugs.  REVIEW OF SYSTEMS:   12 point ROS is negative other than above.  SUBJECTIVE:   VITAL SIGNS: Temp:  [97.4 F (36.3 C)-98.8 F (37.1 C)] 97.4 F  (36.3 C) (03/10 0641) Pulse Rate:  [59-72] 72 (03/10 0641) Resp:  [16-20] 16 (03/10 0641) BP: (121-147)/(65-85) 140/78 mmHg (03/10 0937) SpO2:  [96 %-98 %] 98 % (03/10 0641) FiO2 (%):  [96 %] 96 % (03/10 0117)  PHYSICAL EXAMINATION: General:  Morbidly obese chronically ill appearing male in NAD. Neuro:  Alert and oriented, moving all ext to command. HEENT:  [place]/AT, PERRL, EOM-I and MMM. Neck:  Supple, unable to evaluate JVD and thyroid due to girth.  Stoma with purulent material noted exuding from a non-healed stoma. Cardiovascular:  RRR, Nl S1/S2, -M/R/G. Lungs:  Coarse BS diffusely, distant and bibasilar crackles. Abdomen:  Soft, obese, NT, ND and +BS. Musculoskeletal:  -edema and -tenderness. Skin:  Intact.   Recent Labs Lab 10/22/12 0617 10/23/12 1223  NA 143 135  K 3.9 4.2  CL 102 95*  CO2 34* 29  BUN 36* 26*  CREATININE 0.80 0.88  GLUCOSE 136* 161*    Recent Labs Lab 10/22/12 0617 10/23/12 1223 10/25/12 0700  HGB 15.4 16.5 14.4  HCT 45.9 48.0 44.9  WBC 20.0* 23.1* 20.6*  PLT 430* 374 359   No results found.  ASSESSMENT / PLAN:  52 year old male s/p trach for a crani for acoustic neuroma.  Patient's stoma has not fully closed and there is increased amount of secretion in the sputum that appears purulent.  At this point, I do not think a bronchoscopy will be effective in removing secretion.  Rather treatment of the soft tissue will be the best course of action.  Recommend continuation of the ceftaz but addition of topical triple antibiotic cream as ordered with wound care involvement to address stoma would care with appropriate dressing.  Given the size of the stoma it might also be worthwhile to investigate the possibility of surgical closure as I doubt this will close with secondary intention alone.  Rush Farmer, M.D. Pulmonary and [place] Pager: [phone]  10/28/2012, 1:50 PM

## 2012-10-28 NOTE — Progress Notes (Signed)
Occupational Therapy Session Note  Patient Details  Name: [name] MRN: [id number] Date of Birth: [birth year]  Today's Date: 10/28/2012  Session 1 Time: 0925-1000 Time Calculation (min): 37min  Short Term Goals: Week 1:  OT Short Term Goal 1 (Week 1): Pt will don shirt with supervision OT Short Term Goal 2 (Week 1): Pt will thread LB with min A OT Short Term Goal 3 (Week 1): Pt will transfer to toilet with min A with RW OT Short Term Goal 4 (Week 1): Pt will perform 3/3 toileting steps with max A  Skilled Therapeutic Interventions/Progress Updates:    Pt missed 25 mins skilled OT services secondary to nursing care (IV nurse).  Pt engaged in bathing tasks with sit<>stand at sink.  Pt declined to don clothing and Ted hose this morning.  Pt stated that both made him too hot.  Pt performed sit<>stand and standing at sink with supervision.  Pt fatigues quickly and requires frequent rest breaks.  O2 sats>90% on 2L O2.  Pt requires verbal cues to place hand over trach site when talking.  Focus on activity tolerance, sit<>stand, dynamic standing balance, and safety awareness.  Therapy Documentation Precautions:  Precautions Precautions: Fall Precaution Comments: dizziness with mobility, deaf R ear, blind R eye, double vision, open stoma Restrictions Weight Bearing Restrictions: No General: General Amount of Missed OT Time (min): 25 Minutes  Pt denies pain  See FIM for current functional status  Therapy/Group: Individual Therapy  Session 2 Time: [id number] Pt denies pain Individual Therapy  Pt propelled w/c from room to gym to engage in dynamic standing activities.  Pt performed sit<>stand and dynamic standing tasks with supervision and verbal cues for safety awareness.  Pt continues to fatigue quickly requiring multiple rest breaks throughout session.  O2 sats>90% on 2L O2 although pt becomes SOB with activity.   Leotis Shames [facility] 10/28/2012, 2:46 PM

## 2012-10-28 NOTE — Progress Notes (Signed)
Physical Therapy Session Note  Patient Details  Name: [name] MRN: [id number] Date of Birth: 1961-02-13  Today's Date: 10/28/2012 Time: 0730-0829 Time Calculation (min): 59 min  Short Term Goals: Week 1:  PT Short Term Goal 1 (Week 1): Pt will transfer with S from various surface heights  PT Short Term Goal 2 (Week 1): Pt will gait x 25' with min A PT Short Term Goal 3 (Week 1): Pt will demonstrate dynamic standing balance during functional tasks with steady A  Skilled Therapeutic Interventions/Progress Updates:    Reviewed vestibular exercises briefly, pt encouraged to continue drops and eye patch to avoid Rt. Corneal damage. Supine to sit with supervision and railings however pt inefficient with transition and does not accept therapist cues. Pt noted to have impaired skin integrity of buttocks, RN made aware. Stand pivot transfers with RW performed with min-guard/min assist and cues for upright posture. Cues also needed for safety of transfer, management of brakes and leg rests. Wheelchair propulsion x 80', 50' for strengthening and conditioning with supervision. Step up/downs to 6" step and bil. Railings with min/mod assist. Pt quickly fatigues and can only do 2 at a time then needs to sit and rest. Pt educated on pressure relief (wheelchair pushups every 30 min) while sitting in wheelchair. Pt verbalized understanding and practiced 5 reps.    SpO2 > 94% on 3L [place] O2 during session, checked intermittently.   Therapy Documentation Precautions:  Precautions Precautions: Fall Precaution Comments: dizziness with mobility, deaf R ear, blind R eye, double vision, open stoma Restrictions Weight Bearing Restrictions: No Pain:  no c/o pain, only dizziness   See FIM for current functional status  Therapy/Group: Individual Therapy  Lahoma Rocker 10/28/2012, 12:22 PM

## 2012-10-29 ENCOUNTER — Inpatient Hospital Stay (HOSPITAL_COMMUNITY): Payer: BC Managed Care – PPO | Admitting: Occupational Therapy

## 2012-10-29 ENCOUNTER — Inpatient Hospital Stay (HOSPITAL_COMMUNITY): Payer: BC Managed Care – PPO | Admitting: Speech Pathology

## 2012-10-29 ENCOUNTER — Inpatient Hospital Stay (HOSPITAL_COMMUNITY): Payer: BC Managed Care – PPO | Admitting: Physical Therapy

## 2012-10-29 ENCOUNTER — Inpatient Hospital Stay (HOSPITAL_COMMUNITY): Payer: BC Managed Care – PPO

## 2012-10-29 LAB — GLUCOSE, CAPILLARY: Glucose-Capillary: 107 mg/dL — ABNORMAL HIGH (ref 70–99)

## 2012-10-29 LAB — BASIC METABOLIC PANEL
BUN: 14 mg/dL (ref 6–23)
Calcium: 9.5 mg/dL (ref 8.4–10.5)
Creatinine, Ser: 0.84 mg/dL (ref 0.50–1.35)
GFR calc Af Amer: >90 mL/min (ref >90)
GFR calc non Af Amer: >90 mL/min (ref >90)
Glucose, Bld: 138 mg/dL — ABNORMAL HIGH (ref 70–99)
Potassium: 4.2 mEq/L (ref 3.5–5.1)

## 2012-10-29 LAB — CBC
MCV: 92.9 fL (ref 78.0–100.0)
Platelets: 316 10*3/uL (ref 150–400)
RBC: 4.62 MIL/uL (ref 4.22–5.81)
RDW: 14.6 % (ref 11.5–15.5)
WBC: 9.5 10*3/uL (ref 4.0–10.5)

## 2012-10-29 NOTE — Plan of Care (Signed)
Problem: RH SKIN INTEGRITY Goal: RH STG SKIN FREE OF INFECTION/BREAKDOWN Will remain free of skin breakdown/infection with mod assist.  Outcome: Not Progressing Patient requires total assist to manage skin  Goal: RH STG MAINTAIN SKIN INTEGRITY WITH ASSISTANCE STG Maintain Skin Integrity With mod Assistance.  Outcome: Not Progressing Patient requires total assist to manage skin  Goal: RH STG ABLE TO PERFORM INCISION/WOUND CARE W/ASSISTANCE STG Able To Perform Incision/Wound Care With max Assistance.  Outcome: Not Progressing Patient requires total assist with wound care .

## 2012-10-29 NOTE — Progress Notes (Signed)
Occupational Therapy Session Note  Patient Details  Name: [name] MRN: [id number] Date of Birth: 11/14/1960  Today's Date: 10/29/2012 Time: 1100-1155 Time Calculation (min): 55 min  Short Term Goals: Week 1:  OT Short Term Goal 1 (Week 1): Pt will don shirt with supervision OT Short Term Goal 2 (Week 1): Pt will thread LB with min A OT Short Term Goal 3 (Week 1): Pt will transfer to toilet with min A with RW OT Short Term Goal 4 (Week 1): Pt will perform 3/3 toileting steps with max A  Skilled Therapeutic Interventions/Progress Updates:    Pt in bed upon arrival but agreeable to getting OOB and engaging in bathing tasks at sink.  Pt continues to decline to wear clothing stating he got too hot and it was too difficult to go to bathroom.  Pt amb with RW from bed to w/c to bathe.  Pt required assistance with bathing feet and and perineal area.  Pt amb with RW to bathroom for bowel movement and returned to room upon completion.  Pt completed tasks on RA with O2 sats of 87% but recovered to >90% within 60 seconds.  Pt placed on 2L O2 at end of session while up in w/c at bedside.  Focus on activity tolerance, standing balance, transfers, toileting, and safety awareness.  Therapy Documentation Precautions:  Precautions Precautions: Fall Precaution Comments: dizziness with mobility, deaf R ear, blind R eye, double vision, open stoma Restrictions Weight Bearing Restrictions: No General:   Vital Signs: Therapy Vitals BP: 140/80 mmHg Oxygen Therapy SpO2: 95 % O2 Device: Nasal cannula O2 Flow Rate (L/min): 3 L/min  Pain: Pain Assessment Pt denies pain  See FIM for current functional status  Therapy/Group: Individual Therapy  Leroy Libman 10/29/2012, 12:02 PM

## 2012-10-29 NOTE — Plan of Care (Signed)
Problem: RH BOWEL ELIMINATION Goal: RH STG MANAGE BOWEL WITH ASSISTANCE STG Manage Bowel with mod Assistance.  Outcome: Not Progressing 10/30/10 Patients last BM 10/23/12.  Goal: RH STG MANAGE BOWEL W/MEDICATION W/ASSISTANCE STG Manage Bowel with Medication with mod Assistance.  Outcome: Not Progressing 10/29/12 Patient refused dulcolax suppository. A. Jd Mccaster,LPN

## 2012-10-29 NOTE — Progress Notes (Signed)
PULMONARY  / CRITICAL CARE MEDICINE  Name: [name] MRN: [id number] DOB: Jan 02, 1961    ADMISSION DATE:  10/22/2012 CONSULTATION DATE:  10/28/2012  REFERRING MD :  Dr. Benjamine Mola, ENT.  CHIEF COMPLAINT:  Purulent material from trach stoma.  BRIEF PATIENT DESCRIPTION: 52 year old with history of acoustic neuroma who underwent a resection that due to his size required that he undergo a tracheostomy.  Patient was decannulated after and has been in rehab.  While in rehab, stoma failed to heal properly and the patient continued to have purulent material exuding from the stoma.  Material grew serratia and patient was maintained on ceftaz with little improvement.  PCCM was consulted to evaluate if a bronchoscopy would be of help in control of secretion.  Denies fever/chills/N/V/SOB but does report cough and sputum production and no chest pain.  LINES / TUBES: PIV  CULTURES: Srratia in stoma secretion date unknown. Sputum from 3/8 x2 with mixed flora.. Results for orders placed during the hospital encounter of 10/22/12  URINE CULTURE     Status: None   Collection Time    10/23/12  2:36 AM      Result Value Range Status   Specimen Description URINE, CLEAN CATCH   Final   Special Requests Normal   Final   Culture  Setup Time 10/23/2012 03:01   Final   Colony Count NO GROWTH   Final   Culture NO GROWTH   Final   Report Status 10/24/2012 FINAL   Final  CULTURE, EXPECTORATED SPUTUM-ASSESSMENT     Status: None   Collection Time    10/26/12 12:18 AM      Result Value Range Status   Specimen Description SPUTUM   Final   Special Requests NONE   Final   Sputum evaluation     Final   Value: THIS SPECIMEN IS ACCEPTABLE. RESPIRATORY CULTURE REPORT TO FOLLOW.   Report Status 10/26/2012 FINAL   Final  CULTURE, RESPIRATORY (NON-EXPECTORATED)     Status: None   Collection Time    10/26/12 12:18 AM      Result Value Range Status   Specimen Description SPUTUM   Final   Special Requests NONE   Final    Gram Stain     Final   Value: ABUNDANT WBC PRESENT, PREDOMINANTLY PMN     FEW SQUAMOUS EPITHELIAL CELLS PRESENT     MODERATE GRAM POSITIVE COCCI IN PAIRS     IN CLUSTERS FEW GRAM POSITIVE RODS     FEW GRAM NEGATIVE RODS   Culture NORMAL OROPHARYNGEAL FLORA   Final   Report Status 10/28/2012 FINAL   Final     ANTIBIOTICS: Ceftaz start date unknown>>> Anti-infectives   Start     Dose/Rate Route Frequency Ordered Stop   10/28/12 1200  cefTAZidime (FORTAZ) 1 g in dextrose 5 % 50 mL IVPB     1 g 100 mL/hr over 30 Minutes Intravenous Every 8 hours 10/28/12 1150     10/28/12 0815  cefTAZidime (FORTAZ) 1 g in dextrose 5 % 50 mL IVPB  Status:  Discontinued     1 g 100 mL/hr over 30 Minutes Intravenous 3 times per day 10/28/12 0811 10/28/12 1150   10/23/12 0800  ciprofloxacin (CIPRO) tablet 500 mg  Status:  Discontinued     500 mg Oral 2 times daily 10/23/12 0749 10/28/12 0811   10/22/12 2015  ciprofloxacin (CIPRO) tablet 250 mg  Status:  Discontinued     250 mg Oral 2 times  daily 10/22/12 2005 10/23/12 0749       SUBJECTIVE:  10/29/2012: Sleeping. Trachea Stoma dressing over it   VITAL SIGNS: Temp:  [97.8 F (36.6 C)] 97.8 F (36.6 C) (03/10 1410) Pulse Rate:  [69-74] 74 (03/11 0623) Resp:  [17-18] 18 (03/11 0623) BP: (137-144)/(77-84) 140/80 mmHg (03/11 0922) SpO2:  [95 %-96 %] 95 % (03/11 0839)  PHYSICAL EXAMINATION: General:  Morbidly obese chronically ill appearing male in NAD. Neuro: Currently sleeping  HEENT:  [place]/AT, PERRL, EOM-I and MMM. Neck:  Supple, unable to evaluate JVD and thyroid due to girth.  Stoma with purulent material noted exuding from a non-healed stoma. Cardiovascular:  RRR, Nl S1/S2, -M/R/G. Lungs:  Coarse BS diffusely,  Abdomen:  Soft, obese, NT, ND and +BS. Musculoskeletal:  -edema and -tenderness. Skin:  Intact.   Recent Labs Lab 10/23/12 1223 10/29/12 0658  NA 135 136  K 4.2 4.2  CL 95* 97  CO2 29 31  BUN 26* 14  CREATININE 0.88 0.84   GLUCOSE 161* 138*    Recent Labs Lab 10/23/12 1223 10/25/12 0700 10/29/12 0658  HGB 16.5 14.4 13.7  HCT 48.0 44.9 42.9  WBC 23.1* 20.6* 9.5  PLT 374 359 316   Dg Chest 2 View  10/28/2012  *RADIOLOGY REPORT*  Clinical Data: Persistent secretions and cough, history coronary artery disease, ischemic cardiomyopathy, CHF, hypertension, diabetes, MI  CHEST - 2 VIEW  Comparison: 10/26/2012  Findings: Enlargement of cardiac silhouette post median sternotomy. Mediastinal contours normal. Slight pulmonary vascular congestion. Subsegmental atelectasis right middle lobe. Slight chronic accentuation of perihilar markings similar to previous exam. No definite acute failure or segmental consolidation. No pleural effusion or pneumothorax. Bones unremarkable.  IMPRESSION: Subsegmental atelectasis right middle lobe. Enlargement of cardiac silhouette with slight pulmonary vascular congestion.   Original Report Authenticated By: Lavonia Dana, M.D.     ASSESSMENT / PLAN:  52 year old male s/p trach for a crani for acoustic neuroma.  Patient's stoma has not fully closed and there is increased amount of secretion in the sputum that appears purulent.  At this point, I do not think a bronchoscopy will be effective in removing secretion.  Rather treatment of the soft tissue will be the best course of action.  Recommend continuation of the ceftaz but addition of topical triple antibiotic cream as ordered with wound care involvement to address stoma would care with appropriate dressing.  Given the size of the stoma it might also be worthwhile to investigate the possibility of surgical closure as I doubt this will close with secondary intention alone.  10/29/2012: Pulmonary and critical care medicine has no new recommendations but we'll continue to follow periodically   Dr. Brand Males, M.D., [facility].C.P Pulmonary and Critical Care Medicine Staff Physician [place] [facility] Pager: [phone], If no answer or between  15:00h - 7:00h: call [phone]  10/29/2012 11:06 AM

## 2012-10-29 NOTE — Patient Care Conference (Signed)
Inpatient RehabilitationTeam Conference and Plan of Care Update Date: 10/29/2012   Time: 2:45 PM    Patient Name: [name]      Medical Record Number: [id number]  Date of Birth: [birth year] Sex: Male         Room/Bed: 4027/4027-01 Payor Info: Payor: BLUE CROSS BLUE SHIELD  Plan: BCBS PPO OUT OF STATE  Product Type: *No Product type*     Admitting Diagnosis: acoustic neuroma resection  Admit Date/Time:  10/22/2012  7:35 PM Admission Comments: No comment available   Primary Diagnosis:  Acoustic neuroma Principal Problem: Acoustic neuroma  Patient Active Problem List   Diagnosis Date Noted  . Purulent bronchitis 10/28/2012  . Serratia infection 10/24/2012  . 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 Leucocytosis 10/24/2012  . Impaired fasting glucose 10/24/2012  . Tracheostomy status 10/17/2012  . Acoustic neuroma 10/12/2012  . Status post craniotomy 10/12/2012  . Respiratory failure, post-operative 10/12/2012  . CAD (coronary artery disease) of artery bypass graft 09/09/2012  . 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 Preop cardiovascular exam 09/09/2012  . Morbid obesity 09/09/2012  . Tobacco abuse 09/09/2012    Expected Discharge Date: Expected Discharge Date: 11/12/12  Team Members Present: Physician leading conference: Dr. Alger Simons Nurse Present: Heather Roberts, RN PT Present: Isabelle Course, PT OT Present: Roanna Epley, Bing Neighbors, Artemio Aly, OT SLP Present: Weston Anna, SLP Other (Discipline and Name): Danne Baxter, RN     Current Status/Progress Goal Weekly Team Focus  Medical   vestibular schwannoma with facial nerve injury respiratory failure  improve vestibular symptoms, manage secretions  stoma management, closure, abx   Bowel/Bladder   continent of bowel and bladder/ urinal at times at bedside/condom catheter at hs  mod independent   up to bathroom during the day condom cath at hs   Swallow/Nutrition/ Hydration   Dys.  1 textures with thin liquids, Mod A for utilization of swallowing compensatory strategies  Supervision  increased utilization of swallowing compensatory strategies   ADL's   pt not currently wearing only hospital gowns; min A overall  mod I/supervision overall  activity tolerance, family education   Mobility   min assist for short distance ambulataion and transfers  supervision  family education, endurance, vestibular exercises, increased gait distance and safety with mobility   Communication   Max A  Supervision  increased vocal intensity, functional communication with mutlimodal communication   Safety/Cognition/ Behavioral Observations  Mod A  Min A  working memory, recall of new information    Pain   right side of head vicodin 1 po prn every 4 hours ice prn   less than 3   monitor effectiveness of medication   Skin   buttocks excoriated bilaterally allevyn dressing intact right groin split noted barrier cream and microguard powder to bilateral groin feet skin dry cracked eucerin cream applied drawtx dressing applied in old stoma site former trach site   no new skin breakdown   educate patient on skin care and turning and pressure relief    Rehab Goals Patient on target to meet rehab goals: Yes *See Interdisciplinary Assessment and Plan and progress notes for long and short-term goals  Barriers to Discharge: vestibular/visual deficits    Possible Resolutions to Barriers:  education of pt/family. adaptive techniques    Discharge Planning/Teaching Needs:  Home with family (wife and pt's mother) who can provide supervision 24/7.      Team Discussion:  Lurline Idol site surgically closed and changes made in abx.  Still on D1 diet, however, hope to begin vital stim soon.  Much better participation overall.  Is resistant to wearing eye patch and ted hose though.  Cognitive deficits becoming more apparent and communication improves.  ST to f/u.  Revisions to Treatment Plan:  None at this time.    Continued Need for Acute Rehabilitation Level of Care: The patient requires daily medical management by a physician with specialized training in physical medicine and rehabilitation for the following conditions: Daily direction of a multidisciplinary physical rehabilitation program to ensure safe treatment while eliciting the highest outcome that is of practical value to the patient.: Yes Daily medical management of patient stability for increased activity during participation in an intensive rehabilitation regime.: Yes Daily analysis of laboratory values and/or radiology reports with any subsequent need for medication adjustment of medical intervention for : Other;Neurological problems;Post surgical problems;Pulmonary problems  HOYLE, [name] 10/29/2012, 3:22 PM

## 2012-10-29 NOTE — Progress Notes (Signed)
Physical Therapy Session Note  Patient Details  Name: [name] MRN: [id number] Date of Birth: 11-02-1960  Today's Date: 10/29/2012 Time: 0730-0830 Time Calculation (min): 60 min  Short Term Goals: Week 1:  PT Short Term Goal 1 (Week 1): Pt will transfer with S from various surface heights  PT Short Term Goal 2 (Week 1): Pt will gait x 25' with min A PT Short Term Goal 3 (Week 1): Pt will demonstrate dynamic standing balance during functional tasks with steady A  Skilled Therapeutic Interventions/Progress Updates:    Pt on bedside commode at start of treatment. Pt required significant encouragement to attempt to clean self and became frustrated when PT continued to recommend independence, 3 bouts of standing performed as pt limited with standing endurance. PT to finish assisting pt with final cleaning.  PT unable to talk pt into wearing TED hose despite education on edema reduction and decreased risk of stroke. Ambulation 4 x ~20' with RW, cues for proximity to RW, narrowing base of support, upright posture, and safety with wheelchair parts and UE placement. Pt quickly fatigues, needs motivation to continue. Wheelchair propulsion for endurance x 110' over tile and carpeted surface. Verbally reviewed vestibular exercises.   Therapy Documentation Precautions:  Precautions Precautions: Fall Precaution Comments: dizziness with mobility, deaf R ear, blind R eye, double vision, open stoma Restrictions Weight Bearing Restrictions: No Pain: No c/o pain  See FIM for current functional status  Therapy/Group: Individual Therapy  Lahoma Rocker 10/29/2012, 12:31 PM

## 2012-10-29 NOTE — Consult Note (Signed)
WOC consult Note Reason for Consult: heavily draining tracheostomy site.  Reviewed ENT note, plan for bedside closure.   Wound type: ostomy site erosion with drainage Measurement:3cm x 1.5cm  Wound bed: pink, moist Drainage (amount, consistency, odor) heavy, thick yellow-green secretions, noted when pt. coughs Periwound: some erythema  Dressing procedure/placement/frequency: will add super absorbant dressing for the trach site until ENT can close site.   Re consult if needed, will not follow at this time. Thanks  Melody Kellogg, [place] [phone])

## 2012-10-29 NOTE — Progress Notes (Signed)
Occupational Therapy Session Note  Patient Details  Name: [name] MRN: [id number] Date of Birth: Nov 05, 1960  Today's Date: 10/29/2012 Time: [id number] Time Calculation (min): 45 min  Short Term Goals: Week 1:  OT Short Term Goal 1 (Week 1): Pt will don shirt with supervision OT Short Term Goal 2 (Week 1): Pt will thread LB with min A OT Short Term Goal 3 (Week 1): Pt will transfer to toilet with min A with RW OT Short Term Goal 4 (Week 1): Pt will perform 3/3 toileting steps with max A  Skilled Therapeutic Interventions/Progress Updates:    1:1 focus on functional ambulation with out supplemental O2, sit to stands, standing balance and tolerance without supplemental O2. Pt maintained O2 sats on RA >88% with encouragement for deep breathing. Stood at sink half of the time to be shaved (in prep for vital stim with SLP). Able to demonstrate functional ambulation with RW with close supervision.   Therapy Documentation Precautions:  Precautions Precautions: Fall Precaution Comments: dizziness with mobility, deaf R ear, blind R eye, double vision, open stoma Restrictions Weight Bearing Restrictions: No Pain: Pain Assessment Pain Assessment: No/denies pain  See FIM for current functional status  Therapy/Group: Individual Therapy  Willeen Cass [facility] 10/29/2012, 3:31 PM

## 2012-10-29 NOTE — Progress Notes (Signed)
Speech Language Pathology Daily Session Note  Patient Details  Name: RHODES TWOREK MRN: [id number] Date of Birth: [birth year]  Today's Date: 10/29/2012 Time: 1415-1440 Time Calculation (min): 25 min  Short Term Goals: Week 1: SLP Short Term Goal 1 (Week 1): Pt will apply pressure to stoma to increase vocal intensity at the word level with Mod A verbal and visual cues.  SLP Short Term Goal 2 (Week 1): Pt will utilize swallowing compensatory strategies to minimize overt s/s of aspiration with Mod A verbal and tactile cues.  SLP Short Term Goal 3 (Week 1): Pt will demonstrate sustained attention to functional tasks for 15 minutes with Mod A verbal cues for redirection SLP Short Term Goal 4 (Week 1): Pt will recall newly learned information with Mod A question and visual cues.   Skilled Therapeutic Interventions: Treatment focus on speech goals. Pt demonstrated increased phonation today at the word and phrase level because his stoma site was surgically closed today. Pt continues to need Max A verbal cues for utilization of swallowing compensatory strategies to decrease right anterior spillage and to self-monitor and correct his anterior spillage with meals and conversation. 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   FIM:  Comprehension Comprehension Mode: Auditory Comprehension: 5-Understands basic 90% of the time/requires cueing < 10% of the time Expression Expression Mode: Verbal Expression: 2-Expresses basic 25 - 49% of the time/requires cueing 50 - 75% of the time. Uses single words/gestures. Social Interaction Social Interaction: 4-Interacts appropriately 75 - 89% of the time - Needs redirection for appropriate language or to initiate interaction. Problem Solving Problem Solving: 4-Solves basic 75 - 89% of the time/requires cueing 10 - 24% of the time Memory Memory: 4-Recognizes or recalls 75 - 89% of the time/requires cueing 10 - 24% of the  time  Pain Pain Assessment Pain Assessment: No/denies pain  Therapy/Group: Individual Therapy  PAYNE, COURTNEY 10/29/2012, 3:27 PM

## 2012-10-29 NOTE — Progress Notes (Signed)
Patient ID: [name], male   DOB: 01/22/1961, 52 y.o.   MRN: [id number] Subjective/Complaints: Secretions persist. Patient appears comfortable. Needs to go to bathroom A 12 point review of systems has been performed and if not noted above is otherwise negative.   Objective: Vital Signs: Blood pressure 144/84, pulse 74, temperature 97.8 F (36.6 C), temperature source Oral, resp. rate 18, weight 141.5 kg (311 lb 15.2 oz), SpO2 95.00%. Dg Chest 2 View  10/28/2012  *RADIOLOGY REPORT*  Clinical Data: Persistent secretions and cough, history coronary artery disease, ischemic cardiomyopathy, CHF, hypertension, diabetes, MI  CHEST - 2 VIEW  Comparison: 10/26/2012  Findings: Enlargement of cardiac silhouette post median sternotomy. Mediastinal contours normal. Slight pulmonary vascular congestion. Subsegmental atelectasis right middle lobe. Slight chronic accentuation of perihilar markings similar to previous exam. No definite acute failure or segmental consolidation. No pleural effusion or pneumothorax. Bones unremarkable.  IMPRESSION: Subsegmental atelectasis right middle lobe. Enlargement of cardiac silhouette with slight pulmonary vascular congestion.   Original Report Authenticated By: Lavonia Dana, M.D.     Recent Labs  10/29/12 0658  WBC 9.5  HGB 13.7  HCT 42.9  PLT 316    Recent Labs  10/29/12 0658  NA 136  K 4.2  CL 97  GLUCOSE 138*  BUN 14  CREATININE 0.84  CALCIUM 9.5   CBG (last 3)   Recent Labs  10/28/12 1645 10/28/12 2103 10/29/12 0743  GLUCAP 117* 136* 156*    Wt Readings from Last 3 Encounters:  10/22/12 141.5 kg (311 lb 15.2 oz)  10/21/12 149.7 kg (330 lb 0.5 oz)  10/21/12 149.7 kg (330 lb 0.5 oz)    Physical Exam:  Constitutional: He is oriented to person, place, and time. He appears well-developed and well-nourished.  Morbidly obese HENT:  Head: Normocephalic and atraumatic.  Mouth/Throat: Abnormal dentition. Dental caries present. Oropharyngeal  exudate present.  Neck: Normal range of motion.  Large tracheal stoma still with persistent thick, yellow,  appearing drainage. Dressing in place. Minimal phonation. Communicates with whisper and hand signals.  Cardiovascular: Normal rate and regular rhythm.  Pulmonary/Chest: Effort normal.   no wheezes or rhonchi.  Abdominal: Soft. Bowel sounds are normal. He exhibits no distension. There is no tenderness.  Musculoskeletal: He exhibits edema (1+ pedal edema.).  Bilateral fore and mid foot as well as toes are pink  +pedal pulses felt.  Neurological: He is alert and oriented to person, place, and time.  Dysphonic. Right facial droop,lower facial and frontal droop, unable to close right eyelid. Impulsive. Poor insight and awareness.  Able to follow basic commands without difficulty. BUE with weakness and decrease in fine motor movements right greater than left. Strength grossly   3+ UE,  3, LE's. Good sitting balance Skin: Skin is warm and dry.   Stasis changes bilateral shins. Heels with dry skin.  Psych: more relaxed   Assessment/Plan: 1. 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 Physiatrist is providing close team supervision and 24 hour management of active medical problems listed below. Physiatrist and rehab team continue to assess barriers to discharge/monitor patient progress toward functional and medical goals. FIM: FIM - Bathing Bathing Steps Patient Completed: Chest;Right Arm;Left Arm;Abdomen;Front perineal area;Buttocks;Right upper leg;Left upper leg Bathing: 4: Min-Patient completes 8-9 67f 10 parts or 75+ percent  FIM - Upper Body Dressing/Undressing Upper body dressing/undressing: 0: Wears  gown/pajamas-no public clothing FIM - Lower Body Dressing/Undressing Lower  body dressing/undressing: 0: Wears Interior and spatial designer  FIM - Toileting Toileting: 0: Activity did not occur     FIM - Control and instrumentation engineer Devices: HOB elevated Bed/Chair Transfer: 4: Supine > Sit: Min A (steadying Pt. > 75%/lift 1 leg);4: Sit > Supine: Min A (steadying pt. > 75%/lift 1 leg);4: Bed > Chair or W/C: Min A (steadying Pt. > 75%);3: Chair or W/C > Bed: Mod A (lift or lower assist)  FIM - Locomotion: Wheelchair Locomotion: Wheelchair: 2: Travels 50 - 149 ft with minimal assistance (Pt.>75%) FIM - Locomotion: Ambulation Locomotion: Ambulation Assistive Devices: Administrator Ambulation/Gait Assistance: 4: Min assist Locomotion: Ambulation: 1: Travels less than 50 ft with minimal assistance (Pt.>75%)  Comprehension Comprehension Mode: Auditory Comprehension: 5-Understands basic 90% of the time/requires cueing < 10% of the time  Expression Expression Mode: Verbal Expression Assistive Devices: 6-Other (Comment) Expression: 1-Expresses basis less than 25% of the time/requires cueing greater than 75% of the time.  Social Interaction Social Interaction: 4-Interacts appropriately 75 - 89% of the time - Needs redirection for appropriate language or to initiate interaction.  Problem Solving Problem Solving Mode: Not assessed Problem Solving: 4-Solves basic 75 - 89% of the time/requires cueing 10 - 24% of the time  Memory Memory Mode: Not assessed Memory: 5-Recognizes or recalls 90% of the time/requires cueing < 10% of the time  Medical Problem List and Plan:  1. DVT Prophylaxis/Anticoagulation: Mechanical: Antiembolism stockings, knee (TED hose) Bilateral lower extremities  Sequential compression devices, below knee Bilateral lower extremities.  Add lovenox.  Will check dopplers.  2. Pain Management: N/A.   prn ultram.  3. Mood: Provide ego support and redirection. LCSW to follow for evaluation.  4. Neuropsych: This patient is  capable of making decisions on his/her own behalf.  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 Continue Lacrilube tid with patching at nights to prevent corneal abrasion. Eye patch at night. ?oculoplastics for gold pellet in eyelid 6. CAD: continue atenolol bid. Patient has declined statin per reports.  7. DM type 2- diet controlled: Will monitor BS with AC/HS CBG checks. Use SSI for elevated BS. Currently 120-200 range likely due to stress and stress dose steroids. Fair control at present 8. Chronic LE edema: Low salt diet. Added TEDs.  9. Leukocytosis: urine culture negative.    -ENT will close stoma at bedside likely today  -ceftazidime  -wbc's down to 9.5 today  -appreciate pulmonary feedback 8. HTN: Monitor with bid checks. Continue tenormin bid. Blood pressures have been variable and will adjust as needed.  9. Acute renal insufficiency: resolved  LOS (Days) 7 A FACE TO FACE EVALUATION WAS PERFORMED  Asaiah Hunnicutt T 10/29/2012 8:08 AM

## 2012-10-30 ENCOUNTER — Inpatient Hospital Stay (HOSPITAL_COMMUNITY): Payer: BC Managed Care – PPO

## 2012-10-30 ENCOUNTER — Inpatient Hospital Stay (HOSPITAL_COMMUNITY): Payer: BC Managed Care – PPO | Admitting: Speech Pathology

## 2012-10-30 ENCOUNTER — Inpatient Hospital Stay (HOSPITAL_COMMUNITY): Payer: BC Managed Care – PPO | Admitting: Physical Therapy

## 2012-10-30 ENCOUNTER — Inpatient Hospital Stay (HOSPITAL_COMMUNITY): Payer: BC Managed Care – PPO | Admitting: Occupational Therapy

## 2012-10-30 DIAGNOSIS — R5381 Other malaise: Secondary | ICD-10-CM

## 2012-10-30 DIAGNOSIS — G519 Disorder of facial nerve, unspecified: Secondary | ICD-10-CM

## 2012-10-30 DIAGNOSIS — E669 Obesity, unspecified: Secondary | ICD-10-CM

## 2012-10-30 DIAGNOSIS — R269 Unspecified abnormalities of gait and mobility: Secondary | ICD-10-CM

## 2012-10-30 DIAGNOSIS — D333 Benign neoplasm of cranial nerves: Secondary | ICD-10-CM

## 2012-10-30 DIAGNOSIS — J209 Acute bronchitis, unspecified: Secondary | ICD-10-CM

## 2012-10-30 LAB — GLUCOSE, CAPILLARY
Glucose-Capillary: 129 mg/dL — ABNORMAL HIGH (ref 70–99)
Glucose-Capillary: 121 mg/dL — ABNORMAL HIGH (ref 70–99)

## 2012-10-30 MED ORDER — BENZOCAINE 10 % MT GEL
Freq: Three times a day (TID) | OROMUCOSAL | Status: DC
  Administered 2012-10-30 – 2012-11-12 (×30): via OROMUCOSAL
  Filled 2012-10-30 (×2): qty 9.4

## 2012-10-30 MED ORDER — CHLORHEXIDINE GLUCONATE 0.12 % MT SOLN
15.0000 mL | Freq: Four times a day (QID) | OROMUCOSAL | Status: DC
  Administered 2012-10-30 – 2012-11-11 (×33): 15 mL via OROMUCOSAL
  Filled 2012-10-30 (×56): qty 15

## 2012-10-30 NOTE — Progress Notes (Signed)
Patient ID: [name], male   DOB: 1961-07-06, 52 y.o.   MRN: [id number] Subjective/Complaints: Secretions persistent despite closure. In no distress however A 12 point review of systems has been performed and if not noted above is otherwise negative.   Objective: Vital Signs: Blood pressure 121/65, pulse 64, temperature 97.4 F (36.3 C), temperature source Oral, resp. rate 18, weight 148.4 kg (327 lb 2.6 oz), SpO2 97.00%. Dg Chest 2 View  10/28/2012  *RADIOLOGY REPORT*  Clinical Data: Persistent secretions and cough, history coronary artery disease, ischemic cardiomyopathy, CHF, hypertension, diabetes, MI  CHEST - 2 VIEW  Comparison: 10/26/2012  Findings: Enlargement of cardiac silhouette post median sternotomy. Mediastinal contours normal. Slight pulmonary vascular congestion. Subsegmental atelectasis right middle lobe. Slight chronic accentuation of perihilar markings similar to previous exam. No definite acute failure or segmental consolidation. No pleural effusion or pneumothorax. Bones unremarkable.  IMPRESSION: Subsegmental atelectasis right middle lobe. Enlargement of cardiac silhouette with slight pulmonary vascular congestion.   Original Report Authenticated By: Lavonia Dana, M.D.     Recent Labs  10/29/12 0658  WBC 9.5  HGB 13.7  HCT 42.9  PLT 316    Recent Labs  10/29/12 0658  NA 136  K 4.2  CL 97  GLUCOSE 138*  BUN 14  CREATININE 0.84  CALCIUM 9.5   CBG (last 3)   Recent Labs  10/29/12 1627 10/29/12 2059 10/30/12 0714  GLUCAP 100* 107* 129*    Wt Readings from Last 3 Encounters:  10/29/12 148.4 kg (327 lb 2.6 oz)  10/21/12 149.7 kg (330 lb 0.5 oz)  10/21/12 149.7 kg (330 lb 0.5 oz)    Physical Exam:  Constitutional: He is oriented to person, place, and time. He appears well-developed and well-nourished.  Morbidly obese HENT:  Head: Normocephalic and atraumatic.  Mouth/Throat: Abnormal dentition. Dental caries present. Oropharyngeal exudate  present.  Neck: Normal range of motion.  Large tracheal stoma still with persistent thick, yellow,  appearing drainage. Dressing in place. Minimal phonation. Communicates with whisper and hand signals.  Cardiovascular: Normal rate and regular rhythm.  Pulmonary/Chest: Effort normal.   no wheezes or rhonchi.  Abdominal: Soft. Bowel sounds are normal. He exhibits no distension. There is no tenderness.  Musculoskeletal: He exhibits edema (1+ pedal edema.).  Bilateral fore and mid foot as well as toes are pink  +pedal pulses felt.  Neurological: He is alert and oriented to person, place, and time.  Dysphonic. Right facial droop,lower facial and frontal droop, unable to close right eyelid. Impulsive. Poor insight and awareness.  Able to follow basic commands without difficulty. BUE with weakness and decrease in fine motor movements right greater than left. Strength grossly   3+ UE,  3, LE's. Good sitting balance Skin: Skin is warm and dry.   Stasis changes bilateral shins. Heels with dry skin.  Psych: more relaxed   Assessment/Plan: 1. 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 Physiatrist is providing close team supervision and 24 hour management of active medical problems listed below. Physiatrist and rehab team continue to assess barriers to discharge/monitor patient progress toward functional and medical goals. FIM: FIM - Bathing Bathing Steps Patient Completed: Chest;Right Arm;Left Arm;Abdomen;Buttocks;Right upper leg;Left upper leg Bathing: 3: Mod-Patient completes 5-7 20f 10 parts or 50-74%  FIM - Upper Body Dressing/Undressing Upper body dressing/undressing: 0: Wears gown/pajamas-no public clothing FIM -  Lower Body Dressing/Undressing Lower body dressing/undressing: 0: Wears  gown/pajamas-no public clothing  FIM - Toileting Toileting steps completed by patient: Performs perineal hygiene Toileting: 2: Max-Patient completed 1 of 3 steps  FIM - Radio producer Devices: Recruitment consultant Transfers: 4-To toilet/BSC: Min A (steadying Pt. > 75%);4-From toilet/BSC: Min A (steadying Pt. > 75%)  FIM - Bed/Chair Transfer Bed/Chair Transfer Assistive Devices: Walker;HOB elevated;Bed rails Bed/Chair Transfer: 5: Supine > Sit: Supervision (verbal cues/safety issues);5: Bed > Chair or W/C: Supervision (verbal cues/safety issues)  FIM - Locomotion: Wheelchair Locomotion: Wheelchair: 2: Travels 50 - 149 ft with supervision, cueing or coaxing FIM - Locomotion: Ambulation Locomotion: Ambulation Assistive Devices: Administrator Ambulation/Gait Assistance: 4: Min assist Locomotion: Ambulation: 1: Travels less than 50 ft with minimal assistance (Pt.>75%)  Comprehension Comprehension Mode: Auditory Comprehension: 5-Follows basic conversation/direction: With no assist  Expression Expression Mode: Verbal Expression Assistive Devices: 6-Other (Comment) Expression: 1-Expresses basis less than 25% of the time/requires cueing greater than 75% of the time.  Social Interaction Social Interaction: 4-Interacts appropriately 75 - 89% of the time - Needs redirection for appropriate language or to initiate interaction.  Problem Solving Problem Solving Mode: Not assessed Problem Solving: 4-Solves basic 75 - 89% of the time/requires cueing 10 - 24% of the time  Memory Memory Mode: Not assessed Memory: 4-Recognizes or recalls 75 - 89% of the time/requires cueing 10 - 24% of the time  Medical Problem List and Plan:  1. DVT Prophylaxis/Anticoagulation: Mechanical: Antiembolism stockings, knee (TED hose) Bilateral lower extremities  Sequential compression devices, below knee Bilateral lower extremities.  Add lovenox.  Will check dopplers.  2. Pain  Management: N/A.   prn ultram.  3. Mood: Provide ego support and redirection. LCSW to follow for evaluation.  4. Neuropsych: This patient is capable of making decisions on his/her own behalf.  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 Continue Lacrilube tid with patching at nights to prevent corneal abrasion. Eye patch at night. ?oculoplastics for gold pellet in eyelid 6. CAD: continue atenolol bid. Patient has declined statin per reports.  7. DM type 2- diet controlled: Will monitor BS with AC/HS CBG checks. Use SSI for elevated BS. Currently 120-200 range likely due to stress and stress dose steroids. Fair control at present 8. Chronic LE edema: Low salt diet. Added TEDs.  9. Leukocytosis: improving  -ENT closed stoma at bedside  -ceftazidime  -wbc's down to 9.5 today  -encouraged patient to work on coughing up secretions and to apply pressure over stoma site 8. HTN: Monitor with bid checks. Continue tenormin bid. Blood pressures have been variable and will adjust as needed.  9. Acute renal insufficiency: resolved  LOS (Days) 8 A FACE TO FACE EVALUATION WAS PERFORMED  SWARTZ,ZACHARY T 10/30/2012 8:18 AM

## 2012-10-30 NOTE — Progress Notes (Signed)
NUTRITION FOLLOW UP  DOCUMENTATION CODES  Per approved criteria   -Morbid Obesity    Intervention:   1. Continue Ensure Pudding, Prostat, and Magic Cups 2. Diet rec's per SLP 3. RD to continue to follow nutrition care plan  Nutrition Dx:   Increased nutrient needs related to wound healing as evidenced by estimated needs. Ongoing.  Goal:   Intake to meet >90% of estimated nutrition needs. Improving.  Monitor:   weight trends, lab trends, I/O's, PO intake, supplement tolerance  Assessment:   Admitted with complains of R-sided hearing loss x 1 year and 32-month history of difficulty using hands, hoarseness, urinary frequency, and dizziness with balance problems requiring use of wheelchair. 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 He was admitted on 2/21 for acoustic neroma resection. Post-op required slow vent wean but tolerated ATC and was de-cannulated on 3/03. Oral secretions have decreased and FEES done revealed delay in swallow with ?aspiration/penetration with straws. Was started on D3, thin liquids. He continues to have right facial palsy, dizziness with activity and inability to phonate. Pt was followed by RD staff during acute hospitalization. Pt was maintained on enteral nutrition during vent and weaning process.   Remains on Dysphagia 1 diet. Per SLP note from this morning, pt complained of severe pain on L side of cheek and upper lip. He also needed moderate verbal reminders to place food on left side of oral cavity and to use blue cup (as pt. drinking from regular cup).   Meal intake slowly improving - 50-100%. Continues with order for 30 ml Prostat and Ensure Pudding. RN confirms patient will take supplements as scheduled.  Continues to have persistent secretions at trach site. Seen by pulmonology on 3/10 - noted that patient's stoma has not fully closed. Recommend treatment of the tissue rather than bronchoscopy. Underwent surgical closure of  trach fistula this morning.   Height: Ht Readings from Last 1 Encounters:  10/12/12 5\' 11"  (1.803 m)    Weight Status:   Wt Readings from Last 1 Encounters:  10/29/12 327 lb 2.6 oz (148.4 kg)  Wt trending up.  Re-estimated needs:  Kcal: 2300 - 2500 Protein: 115 - 140 grams Fluid: 2.3 - 2.5 liters  Skin: DTI to L and R buttocks  Diet Order: Dysphagia 1; thin liquids   Intake/Output Summary (Last 24 hours) at 10/30/12 1143 Last data filed at 10/30/12 0910  Gross per 24 hour  Intake   1080 ml  Output    950 ml  Net    130 ml    Last BM: 3/11   Labs:   Recent Labs Lab 10/23/12 1223 10/29/12 0658  NA 135 136  K 4.2 4.2  CL 95* 97  CO2 29 31  BUN 26* 14  CREATININE 0.88 0.84  CALCIUM 10.0 9.5  GLUCOSE 161* 138*    CBG (last 3)   Recent Labs  10/29/12 2059 10/30/12 0714 10/30/12 1116  GLUCAP 107* 129* 121*    Scheduled Meds: . albuterol  2.5 mg Nebulization BID  . antiseptic oral rinse  15 mL Mouth Rinse QID  . artificial tears   Right Eye Q8H  . atenolol  50 mg Oral BID  . cefTAZidime (FORTAZ)  IV  1 g Intravenous Q8H  . chlorhexidine  15 mL Mouth Rinse BID  . enoxaparin (LOVENOX) injection  40 mg Subcutaneous Q24H  . feeding supplement  1 Container Oral TID BM  . feeding supplement  30 mL Oral TID WC  .  glycopyrrolate  1 mg Oral BID  . guaiFENesin  1,200 mg Oral BID  . hydrocerin   Topical BID  . insulin aspart  0-15 Units Subcutaneous TID WC  . insulin aspart  0-5 Units Subcutaneous QHS  . insulin aspart  3 Units Subcutaneous TID WC  . ipratropium  0.5 mg Nebulization BID  . multivitamin with minerals  1 tablet Oral Daily  . neomycin-bacitracin-polymyxin   Topical TID  . neomycin-bacitracin-polymyxin   Topical TID  . saccharomyces boulardii  250 mg Oral BID  . senna-docusate  2 tablet Oral BID  . tamsulosin  0.4 mg Oral QPC supper    Continuous Infusions:  none  Inda Coke MS, RD, LDN Pager: [phone] After-hours pager:  [phone]

## 2012-10-30 NOTE — Progress Notes (Signed)
Social Work Patient ID: [name], male   DOB: Jun 12, 1961, 52 y.o.   MRN: [id number]  Met with patient today to review team conference report.  Pt aware of targeted d/c date 3/25 at supervision goals.  Pt does not feel that he will be ready to go by then.  Attempted to provide encouragement, however, pt remained pessimistic.  He did offer a slight smile when I explained that his treating therapists have been encouraged past few days.  Still to follow up with his wife.  HOYLE, [name], LCSW

## 2012-10-30 NOTE — Progress Notes (Addendum)
Speech Language Pathology Daily Session Note  Patient Details  Name: [name] MRN: [id number] Date of Birth: [birth year]  Today's Date: 10/30/2012 Time: 0800-0830, [id number] Time Calculation (min): 30 min, 25 min  Short Term Goals: Week 1: SLP Short Term Goal 1 (Week 1): Pt will apply pressure to stoma to increase vocal intensity at the word level with Mod A verbal and visual cues.  SLP Short Term Goal 2 (Week 1): Pt will utilize swallowing compensatory strategies to minimize overt s/s of aspiration with Mod A verbal and tactile cues.  SLP Short Term Goal 3 (Week 1): Pt will demonstrate sustained attention to functional tasks for 15 minutes with Mod A verbal cues for redirection SLP Short Term Goal 4 (Week 1): Pt will recall newly learned information with Mod A question and visual cues.   Skilled Therapeutic Interventions: Session 1:  Pt. seen for dysphagia treatment during breakfast.  SLP obtained a large mirror placed on right side of tray for identificatin of labial/facial residue in which he required mod-max verbal/visual reminders to use.  Pt. complained of severe pain on left side of cheek and upper lip.  He also needed moderate verbal reminders to place food on left side of oral cavity and to use blue cup (as pt. drinking from regular cup).  No s/s aspiration.  Dr. Benjamine Mola arrived, checked stoma and changed gauze over due to continued but decreased drainage.  SLP provided pt.with moderate reminders to place hand over gauze to occlude stoma facilitating redirection of air to upper airway.  Pt. able to achieve fair to good vocal quality and intelligibility.      Session 2:  Pt. Seen for intervention of dysarthria and dysphagia utilizing NMES.  Electrodes placed lateral to right buccinator muscle and slightly posterior and superior to initial electorde.  He tolerated up to 8.0 mega hertz during volitional oral-motor exercises for labial retraction and protrusion.  Pt. Required mod-max  cueing to intermittently gather oral saliva and swallow to prevent right labial leakage.     FIM:  Comprehension Comprehension: 5-Understands complex 90% of the time/Cues < 10% of the time Expression Expression Mode: Verbal;Nonverbal Expression: 3-Expresses basic 50 - 74% of the time/requires cueing 25 - 50% of the time. Needs to repeat parts of sentences. Social Interaction Social Interaction: 5-Interacts appropriately 90% of the time - Needs monitoring or encouragement for participation or interaction. Problem Solving Problem Solving: 4-Solves basic 75 - 89% of the time/requires cueing 10 - 24% of the time Memory Memory: 4-Recognizes or recalls 75 - 89% of the time/requires cueing 10 - 24% of the time FIM - Eating Eating Activity: 5: Supervision/cues  Pain Pain Assessment Pain Type: Chronic pain Pain Location:  (left cheek) Pain Intervention(s): RN made aware  Therapy/Group: Individual Therapy  Houston Siren 10/30/2012, 8:57 AM

## 2012-10-30 NOTE — Progress Notes (Signed)
Physical Therapy Note  Patient Details  Name: [name] MRN: [id number] Date of Birth: 03/27/1961 Today's Date: 10/30/2012  1100-1135 (35 minutes) individual (missed 25 minutes/fatigue) Pain: no reported pain Focus of treatment: Therapeutic exercise focused on bilateral LE strengthening/activity tolerance Treatment: Pt sitting in wc. Reports that he wants to stay close to suction device and not feeling well today. He agreed to perform exercises in room - Stepping up/down 6 inch step 2 X 10 (RW support) with one seated rest break (hip flexor strengthening); up/ Down 4 inch step (quad strengthening)  with rw support 1 X 10 before co/ of fatigue and reports that he is done.   HOUT,JIM 10/30/2012, 11:44 AM

## 2012-10-30 NOTE — Progress Notes (Signed)
Occupational Therapy Weekly Progress Note  Patient Details  Name: [name] MRN: [id number] Date of Birth: 1961/04/28  Today's Date: 10/30/2012  Patient has met 3 of 4 short term goals.  Pt has progressed steadily during the later part of the past week.  Pt continues to wear hospital gown but has agreed to don/doff clothing to enable thorough assessment of patient's functional status.  Pt requires assistance with LB bathing and dressing tasks but is able to complete all UB tasks without assistance.  Pt performs sit<>stand with supervision and toilet transfers with steady A.  Pt continues to exhibit frustration with current medical status but has actively participated in all therapies.  Pt fatigues quickly and requires multiple rest breaks throughout therapy sessions.  Pt has been trying to perform activity off O2 and can maintain sats above 90% for brief activity; ie ambulating to toilet and performing toileting and return to room; with rest breaks.  Patient continues to demonstrate the following deficits: muscle weakness, decreased cardiorespiratoy endurance and decreased oxygen support, decreased visual acuity and decreased visual motor skills, dizziness with positional changes and decreased standing balance, decreased balance strategies and difficulty maintaining precautions and therefore will continue to benefit from skilled OT intervention to enhance overall performance with BADL and Reduce care partner burden.  Patient progressing toward long term goals..  Continue plan of care.  OT Short Term Goals Week 1:  OT Short Term Goal 1 (Week 1): Pt will don shirt with supervision OT Short Term Goal 1 - Progress (Week 1): Met OT Short Term Goal 2 (Week 1): Pt will thread LB with min A OT Short Term Goal 2 - Progress (Week 1): Progressing toward goal OT Short Term Goal 3 (Week 1): Pt will transfer to toilet with min A with RW OT Short Term Goal 3 - Progress (Week 1): Met OT Short Term Goal 4  (Week 1): Pt will perform 3/3 toileting steps with max A OT Short Term Goal 4 - Progress (Week 1): Met Week 2:  OT Short Term Goal 1 (Week 2): Pt will thread pants during LB dressing with min A using AE PRN OT Short Term Goal 2 (Week 2): Pt will perform toileting tasks with steady A OT Short Term Goal 3 (Week 2): Pt will amb with RW into bathroom for toilet transfers with supervision OT Short Term Goal 4 (Week 2): Pt will complete bathing tasks with min A  Skilled Therapeutic Interventions/Progress Updates:    continue with POC  Therapy Documentation Precautions:  Precautions Precautions: Fall Precaution Comments: dizziness with mobility, deaf R ear, blind R eye, double vision, open stoma Restrictions Weight Bearing Restrictions: No General: General Missed Time Reason: Patient fatigue  See FIM for current functional status  Therapy/Group: Individual Therapy  Leroy Libman 10/30/2012, 3:19 PM

## 2012-10-30 NOTE — Op Note (Signed)
NAME:  [name], [name] NO.:  [id number]  MEDICAL RECORD NO.:  [id number]  LOCATION:  [id number]                         FACILITY:  [facility]  PHYSICIAN:  Leta Baptist, MD            DATE OF BIRTH:  Jan 17, 1961  DATE OF PROCEDURE:  10/29/2012 DATE OF DISCHARGE:                              OPERATIVE REPORT   SURGEON:  Leta Baptist, MD  PREOPERATIVE DIAGNOSES: 1. Persistent tracheostomy stoma. 2. Persistent tracheostomy drainage.  POSTOPERATIVE DIAGNOSES: 1. Persistent tracheostomy stoma. 2. Persistent tracheostomy drainage.  PROCEDURE PERFORMED:  Surgical closure of tracheostomy fistula - CPT code [phone].  ANESTHESIA:  Local anesthesia with 2% lidocaine.  COMPLICATIONS:  None.  ESTIMATED BLOOD LOSS:  Minimal.  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  The patient was eventually weaned off the ventilator support after the surgery.  The patient is currently being admitted for inpatient rehab therapy.  Since the trach removal, the patient has been experiencing copious amount of thick mucoid drainage from the trach stoma.  As a result of the copious drainage, the stoma has not healed adequately.  The patient has significant difficulty communicating verbally due to the large stoma. Based on the above findings, the decision was made for the patient to undergo surgical closure of the tracheostomy stoma.  The risks, benefits, and details of the procedure were discussed with the patient.  DESCRIPTION OF THE PROCEDURE:  The patient was placed on his hospital bed.  The tracheostomy stoma area was cleaned and prepped and draped in a sterile fashion.  2% lidocaine was injected locally around the tracheostomy stoma.  Running locking 3-0 Prolene sutures were used to obtain watertight closure of the tracheostomy stoma.  The patient tolerated  the procedure well.  Dressing was applied.  OPERATIVE FINDINGS:  Persistently large tracheostomy stoma with copious mucoid drainage.  SPECIMEN:  None.  FOLLOWUP CARE:  The patient should continue with his antibiotic treatment while in the hospital.  We will leave the sutures in place for approximately 2 weeks.     Leta Baptist, MD     ST/MEDQ  D:  10/29/2012  T:  10/30/2012  Job:  [id number]

## 2012-10-30 NOTE — Progress Notes (Signed)
Occupational Therapy Session Note  Patient Details  Name: [name] MRN: [id number] Date of Birth: [birth year]  Today's Date: 10/30/2012  Session 1 Time: 1000-1055 Time Calculation (min): 55 min  Short Term Goals: Week 1:  OT Short Term Goal 1 (Week 1): Pt will don shirt with supervision OT Short Term Goal 2 (Week 1): Pt will thread LB with min A OT Short Term Goal 3 (Week 1): Pt will transfer to toilet with min A with RW OT Short Term Goal 4 (Week 1): Pt will perform 3/3 toileting steps with max A  Skilled Therapeutic Interventions/Progress Updates:    Pt seated EOB and agreeable to amb with RW to sink for bathing and dressing.  Pt initially agreed to donning shirt and pants but changed his mind while bathing.  Pt did agree to donning shirt and pants and then removing them to don hospital gown. Pt stated the left side of his face and mouth were burning; RN aware.  Pt completed bathing tasks with sit<>stand from w/c.  Pt placed LLE on trash can while seated to acces front perineal area for bathing.  Pt required assistance with for bathing buttocks and bilateral feet.  Pt expressed frustration with pain on left side of face but also states that he is trying as hard as he can.  Focus on activity tolerance, functional ambulation, sit<>stand, dynamic standing balance, and safety awareness.    Therapy Documentation Precautions:  Precautions Precautions: Fall Precaution Comments: dizziness with mobility, deaf R ear, blind R eye, double vision, open stoma Restrictions Weight Bearing Restrictions: No   Pain: Pain Assessment Pain Assessment: 0-10 Pain Score:   7 Pain Type: Acute pain Pain Location: Face Pain Orientation: Left;Anterior Pain Descriptors: Burning Pain Intervention(s): RN made aware  See FIM for current functional status  Therapy/Group: Individual Therapy  Pt seated in w/c and agreeable to engaging in dynamic standing activities in room.  Pt wanted to remain in  room to be close to suction machine.  Pt engaged in sit<>stand with and without RW and dynamic standing tasks with and without BUE support.  Pt completed all tasks on RA and O2 sats>90%.  O2 placed on patient at end of session.  Pt required only supervision for all tasks but fatigued quickly and required multiple rest breaks throughout.  Leotis Shames [facility] 10/30/2012, 10:57 AM

## 2012-10-31 ENCOUNTER — Inpatient Hospital Stay (HOSPITAL_COMMUNITY): Payer: BC Managed Care – PPO | Admitting: Speech Pathology

## 2012-10-31 ENCOUNTER — Inpatient Hospital Stay (HOSPITAL_COMMUNITY): Payer: BC Managed Care – PPO | Admitting: Physical Therapy

## 2012-10-31 ENCOUNTER — Inpatient Hospital Stay (HOSPITAL_COMMUNITY): Payer: BC Managed Care – PPO | Admitting: Occupational Therapy

## 2012-10-31 LAB — CULTURE, RESPIRATORY W GRAM STAIN

## 2012-10-31 LAB — GLUCOSE, CAPILLARY
Glucose-Capillary: 103 mg/dL — ABNORMAL HIGH (ref 70–99)
Glucose-Capillary: 126 mg/dL — ABNORMAL HIGH (ref 70–99)

## 2012-10-31 NOTE — Progress Notes (Signed)
Patient ID: [name], male   DOB: 18-Apr-1961, 52 y.o.   MRN: [id number] Subjective/Complaints: Cough a little better. Some drainage still from wound. A 12 point review of systems has been performed and if not noted above is otherwise negative.   Objective: Vital Signs: Blood pressure 124/67, pulse 58, temperature 97.8 F (36.6 C), temperature source Oral, resp. rate 20, weight 153.9 kg (339 lb 4.6 oz), SpO2 100.00%. No results found.  Recent Labs  10/29/12 0658  WBC 9.5  HGB 13.7  HCT 42.9  PLT 316    Recent Labs  10/29/12 0658  NA 136  K 4.2  CL 97  GLUCOSE 138*  BUN 14  CREATININE 0.84  CALCIUM 9.5   CBG (last 3)   Recent Labs  10/30/12 1641 10/30/12 2152 10/31/12 0723  GLUCAP 89 115* 103*    Wt Readings from Last 3 Encounters:  10/30/12 153.9 kg (339 lb 4.6 oz)  10/21/12 149.7 kg (330 lb 0.5 oz)  10/21/12 149.7 kg (330 lb 0.5 oz)    Physical Exam:  Constitutional: He is oriented to person, place, and time. He appears well-developed and well-nourished.  Morbidly obese HENT:  Head: Normocephalic and atraumatic.  Mouth/Throat: Abnormal dentition. Dental caries present. Oropharyngeal exudate present.  Neck: Normal range of motion.  Trach site sutured closed. Slight, yellow drainage from wound but decreased. Some redness around wound. Cardiovascular: Normal rate and regular rhythm.  Pulmonary/Chest: Effort normal.   no wheezes a few rhonchi. No distress.  Abdominal: Soft. Bowel sounds are normal. He exhibits no distension. There is no tenderness.  Musculoskeletal: He exhibits edema (1+ pedal edema.).  Bilateral fore and mid foot as well as toes are pink  +pedal pulses felt.  Neurological: He is alert and oriented to person, place, and time.  Dysphonic, but better when he holds stoma site with fingers. Right facial droop,lower facial and frontal droop, unable to close right eyelid. Impulsive. Some improvement insight and awareness.  Able to follow  basic commands without difficulty. BUE with weakness and decrease in fine motor movements right greater than left. Strength grossly   3+ UE,  3, LE's. Good sitting balance Skin: Skin is warm and dry.   Stasis changes bilateral shins. Heels with dry skin.  Psych: more relaxed   Assessment/Plan: 1. 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 Physiatrist is providing close team supervision and 24 hour management of active medical problems listed below. Physiatrist and rehab team continue to assess barriers to discharge/monitor patient progress toward functional and medical goals. FIM: FIM - Bathing Bathing Steps Patient Completed: Chest;Right Arm;Left Arm;Abdomen;Front perineal area;Right upper leg;Left upper leg Bathing: 3: Mod-Patient completes 5-7 35f 10 parts or 50-74%  FIM - Upper Body Dressing/Undressing Upper body dressing/undressing: 0: Wears gown/pajamas-no public clothing FIM - Lower Body Dressing/Undressing Lower body dressing/undressing: 0: Wears gown/pajamas-no public clothing  FIM - Toileting Toileting steps completed by patient: Performs perineal hygiene Toileting: 2: Max-Patient completed 1 of 3 steps  FIM - Radio producer Devices: Recruitment consultant Transfers: 4-To toilet/BSC: Min A (steadying Pt. > 75%);4-From toilet/BSC: Min A (steadying Pt. > 75%)  FIM - Bed/Chair Transfer Bed/Chair Transfer Assistive Devices: Walker;HOB elevated;Bed rails Bed/Chair Transfer: 5: Supine > Sit: Supervision (verbal cues/safety issues);5: Bed > Chair or W/C: Supervision (verbal cues/safety issues)  FIM - Locomotion: Wheelchair Locomotion:  Wheelchair: 2: Travels 29 - 149 ft with supervision, cueing or coaxing FIM - Locomotion:  Ambulation Locomotion: Ambulation Assistive Devices: Administrator Ambulation/Gait Assistance: 4: Min assist Locomotion: Ambulation: 1: Travels less than 50 ft with minimal assistance (Pt.>75%)  Comprehension Comprehension Mode: Auditory Comprehension: 5-Understands complex 90% of the time/Cues < 10% of the time  Expression Expression Mode: Verbal Expression Assistive Devices: 6-Other (Comment) Expression: 3-Expresses basic 50 - 74% of the time/requires cueing 25 - 50% of the time. Needs to repeat parts of sentences.  Social Interaction Social Interaction: 5-Interacts appropriately 90% of the time - Needs monitoring or encouragement for participation or interaction.  Problem Solving Problem Solving Mode: Not assessed Problem Solving: 4-Solves basic 75 - 89% of the time/requires cueing 10 - 24% of the time  Memory Memory Mode: Not assessed Memory: 4-Recognizes or recalls 75 - 89% of the time/requires cueing 10 - 24% of the time  Medical Problem List and Plan:  1. DVT Prophylaxis/Anticoagulation: Mechanical: Antiembolism stockings, knee (TED hose) Bilateral lower extremities  Sequential compression devices, below knee Bilateral lower extremities.  Add lovenox.  Will check dopplers.  2. Pain Management: N/A.   prn ultram.  3. Mood: Provide ego support and redirection. LCSW to follow for evaluation.  4. Neuropsych: This patient is capable of making decisions on his/her own behalf.  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 Continue Lacrilube tid with patching at nights to prevent corneal abrasion. Eye patch at night. ?oculoplastics for gold pellet in eyelid 6. CAD: continue atenolol bid. Patient has declined statin per reports.  7. DM type 2- diet controlled: Will monitor BS with AC/HS CBG checks. Use SSI for elevated BS. Currently 120-200 range likely due to stress and stress dose steroids. Fair control at present 8. Chronic LE edema: Low salt diet. Added TEDs. Needs to elevate  feet. 9. Leukocytosis: improving  -ENT closed stoma at bedside  -ceftazidime  -wbc's decreased  -encouraged patient to work on coughing up secretions and to apply pressure over stoma site 8. HTN: Monitor with bid checks. Continue tenormin bid. Blood pressures have been variable and will adjust as needed.  9. Acute renal insufficiency: resolved  LOS (Days) 9 A FACE TO FACE EVALUATION WAS PERFORMED  Matheu Ploeger T 10/31/2012 8:09 AM

## 2012-10-31 NOTE — Progress Notes (Signed)
Physical Therapy Session Note  Patient Details  Name: [name] MRN: [id number] Date of Birth: 08-11-1961  Today's Date: 10/31/2012 Time: 1345-1430 Time Calculation (min): 45 min  Short Term Goals: Week 2:  PT Short Term Goal 1 (Week 2): Pt will ambulate 70' with supervision and RW PT Short Term Goal 2 (Week 2): Pt will perform vestibular exercises with only supervision cues needed PT Short Term Goal 3 (Week 2): Pt will transfer wheelchair <> mat with modified independence  Skilled Therapeutic Interventions/Progress Updates:    Swelling in bil. LEs, hot to touch, RN in room and reports PA has been made aware and is to check on pt. Pt declined ambulation however was willing to work on balance and vestibular exercises in the room. Pt engaged in seated VOR x 1 exercises with cuing for appropriate performance of task. Pt encouraged to perform these exercises in room to increase near distance vision/decrease dizziness. Standing vestibular balance exercises with varied bases of support and without upper extremity support. Bed mobility with min assist, pt requires assist to lift one LE into bed. Pt requires frequent rest breaks. Overall he is min assist/min-guard assist.   Pt on room air during session, see vitals below.   Therapy Documentation Precautions:  Precautions Precautions: Fall Precaution Comments: dizziness with mobility, deaf R ear, blind R eye, double vision, open stoma Restrictions Weight Bearing Restrictions: No Vital Signs: Oxygen Therapy SpO2: 93 % (at lowest during PT) O2 Device: None (Room air) Pain: Pain Assessment Pain Assessment: 0-10 Pain Score: 10-Worst pain ever Pain Location: Foot Pain Orientation: Other (Comment);Right;Left Pain Descriptors: Burning Pain Frequency: Constant Pain Onset: On-going Patients Stated Pain Goal: 2 Pain Intervention(s): Medication (See eMAR) (vicodin 1 po)  See FIM for current functional status  Therapy/Group: Individual  Therapy  Lahoma Rocker 10/31/2012, 3:58 PM

## 2012-10-31 NOTE — Progress Notes (Signed)
Occupational Therapy Session Note  Patient Details  Name: [name] MRN: [id number] Date of Birth: [birth year]  Today's Date: 10/31/2012 Time: 0930-1010 Time Calculation (min): 40 min Patient missed 20 Minutes of skilled occupational therapy see below..  Short Term Goals: Week 1:  OT Short Term Goal 1 (Week 1): Pt will don shirt with supervision OT Short Term Goal 1 - Progress (Week 1): Met OT Short Term Goal 2 (Week 1): Pt will thread LB with min A OT Short Term Goal 2 - Progress (Week 1): Progressing toward goal OT Short Term Goal 3 (Week 1): Pt will transfer to toilet with min A with RW OT Short Term Goal 3 - Progress (Week 1): Met OT Short Term Goal 4 (Week 1): Pt will perform 3/3 toileting steps with max A OT Short Term Goal 4 - Progress (Week 1): Met  Week 2:  OT Short Term Goal 1 (Week 2): Pt will thread pants during LB dressing with min A using AE PRN OT Short Term Goal 2 (Week 2): Pt will perform toileting tasks with steady A OT Short Term Goal 3 (Week 2): Pt will amb with RW into bathroom for toilet transfers with supervision OT Short Term Goal 4 (Week 2): Pt will complete bathing tasks with min A  Skilled Therapeutic Interventions/Progress Updates:  Patient found seated in w/c with RN present giving medications. Patient refused bathing task at this time, patient also refused getting dressed at this time. Therapist assisted patient with donning socks, patient stated he couldn't use sock aid. Patient then stated he needed to use restroom. Patient ambulated from w/c -> bathroom -> elevated toilet seat with close supervision. Patient with incontinent urine episode while seated on toilet. Patient stated he needed some time on toilet and promised to call nurses station when completed. Patient missed 20 minutes of skilled occupational therapy secondary to bathroom needs. Notified NT that patient was left on toilet seat.  Precautions:  Precautions Precautions: Fall Precaution  Comments: dizziness with mobility, deaf R ear, blind R eye, double vision, open stoma Restrictions Weight Bearing Restrictions: No  See FIM for current functional status  Therapy/Group: Individual Therapy  [name] 10/31/2012, 10:29 AM

## 2012-10-31 NOTE — Progress Notes (Signed)
Subjective: Stoma drainage has decreased. Pt c/o frequent coughing last night. Speech has improved since stoma closure  Objective: Vital signs in last 24 hours: Temp:  [97.5 F (36.4 C)-97.8 F (36.6 C)] 97.8 F (36.6 C) (03/13 0533) Pulse Rate:  [58-77] 58 (03/13 0533) Resp:  [18-20] 20 (03/13 0533) BP: (118-129)/(67-86) 124/67 mmHg (03/13 0533) SpO2:  [94 %-100 %] 100 % (03/13 0533) Weight:  [153.9 kg (339 lb 4.6 oz)] 153.9 kg (339 lb 4.6 oz) (03/12 2006)  Awake and responsive.  Able to communicate verbally and via gestures.  Right facial palsy noted. Incomplete eye closure.  Trach incision closed.  No significant erythema or edema.   Recent Labs  10/29/12 0658  WBC 9.5  HGB 13.7  HCT 42.9  PLT 316    Recent Labs  10/29/12 0658  NA 136  K 4.2  CL 97  CO2 31  GLUCOSE 138*  BUN 14  CREATININE 0.84  CALCIUM 9.5    Medications:  I have reviewed the patient's current medications. Scheduled: . albuterol  2.5 mg Nebulization BID  . artificial tears   Right Eye Q8H  . atenolol  50 mg Oral BID  . benzocaine   Mouth/Throat TID PC & HS  . cefTAZidime (FORTAZ)  IV  1 g Intravenous Q8H  . chlorhexidine  15 mL Mouth/Throat QID  . enoxaparin (LOVENOX) injection  40 mg Subcutaneous Q24H  . feeding supplement  1 Container Oral TID BM  . feeding supplement  30 mL Oral TID WC  . glycopyrrolate  1 mg Oral BID  . guaiFENesin  1,200 mg Oral BID  . hydrocerin   Topical BID  . insulin aspart  0-15 Units Subcutaneous TID WC  . insulin aspart  0-5 Units Subcutaneous QHS  . insulin aspart  3 Units Subcutaneous TID WC  . ipratropium  0.5 mg Nebulization BID  . multivitamin with minerals  1 tablet Oral Daily  . neomycin-bacitracin-polymyxin   Topical TID  . neomycin-bacitracin-polymyxin   Topical TID  . saccharomyces boulardii  250 mg Oral BID  . senna-docusate  2 tablet Oral BID  . tamsulosin  0.4 mg Oral QPC supper    Assessment/Plan: 1. POD #20 s/p right AN resection.  Postop CT shows good tumor resection and decompression.  2. Right facial palsy noted. Will need lacrilube to prevent corneal drying. Will get oculoplastic consult as an outpatient. 3. Stoma closed. Slight drainage is noted. Change dressing prn.  Will remove sutures in 2 weeks. 4. Continue abx coverage.     LOS: 9 days   Anitria Andon,SUI W 10/31/2012, 8:25 AM

## 2012-10-31 NOTE — Progress Notes (Signed)
Speech Language Pathology Weekly Progress & Session Notes  Patient Details  Name: [name] MRN: [id number] Date of Birth: April 30, 1961  Today's Date: 10/31/2012 Time: 1030-1100 Time Calculation (min): 30 min  Short Term Goals: Week 1: SLP Short Term Goal 1 (Week 1): Pt will apply pressure to stoma to increase vocal intensity at the word level with Mod A verbal and visual cues.  SLP Short Term Goal 1 - Progress (Week 1): Met SLP Short Term Goal 2 (Week 1): Pt will utilize swallowing compensatory strategies to minimize overt s/s of aspiration with Mod A verbal and tactile cues.  SLP Short Term Goal 2 - Progress (Week 1): Met SLP Short Term Goal 3 (Week 1): Pt will demonstrate sustained attention to functional tasks for 15 minutes with Mod A verbal cues for redirection SLP Short Term Goal 3 - Progress (Week 1): Met SLP Short Term Goal 4 (Week 1): Pt will recall newly learned information with Mod A question and visual cues.  SLP Short Term Goal 4 - Progress (Week 1): Not met  New Short Term Goals:  Week 2: SLP Short Term Goal 1 (Week 2): Pt will recall newly learned information with Mod A question and visual cues. SLP Short Term Goal 2 (Week 2): Pt will demonstrate selective attention to a functional task for 15 minutes with Mod A verbal cues for redirection SLP Short Term Goal 3 (Week 2): Pt will utilize swallowing compensatory startegies to minimize right buccal pocketing and anterior loss with Min A verbal and question cues.  SLP Short Term Goal 4 (Week 2): pt will apply pressure to his stoma to increase vocal intensity at the phrase level with Min A verbal and visual cues.   Weekly Progress Updates: Pt has made functional gains and has met 3 of 4 STG's this reporting period. Currently, pt is consuming Dys. 1 textures with thin liquids without overt s/s of aspiration but requires Mod A verbal cues to self-monitor and correct right buccal pocketing and anterior loss. Pt's stoma site was  surgically closed which has increased his overall breath support but requires Mod A verbal and visual cues to apply pressure to stoma site to maximize vocal intensity which has increased his overall speech intelligibility to 75% at the phrase level. Pt also continues to demonstrate mild-moderate cognitive impairments and requires Mod A to utilize compensatory strategies for working memory and to maximize overall safety awareness. Pt would benefit from continued skilled SLP intervention to maximize functional communication, swallowing function with least restrictive diet and cognitive recovery. Pt/family education ongoing.    SLP Intensity: Minumum of 1-2 x/day, 30 to 90 minutes SLP Frequency: 5 out of 7 days SLP Duration/Estimated Length of Stay: 1 week SLP Treatment/Interventions: Cognitive remediation/compensation;Cueing hierarchy;Dysphagia/aspiration precaution training;Environmental controls;Functional tasks;Internal/external aids;Speech/Language facilitation;Therapeutic Exercise;Therapeutic Activities;Patient/family education;Neuromuscular electrical stimulation  Daily Session Skilled Therapeutic Intervention: Treatment focus on speech and cognitive goals. Upon entering room, pt was sitting EOB with his nasal cannula on the floor. The pt reported he transferred from his wheelchair to his bed without assistance. Pt reeducated on utilizing call bell to ask for assistance. Pt applied pressure to his stoma at the phrase level with Min A verbal and visual cues and was ~75% intelligible. Pt also required Max verbal cues to attend to right anterior loss of saliva during functional communication.  FIM:  Comprehension Comprehension Mode: Auditory Comprehension: 4-Understands basic 75 - 89% of the time/requires cueing 10 - 24% of the time Expression Expression Mode: Verbal Expression: 3-Expresses basic  50 - 74% of the time/requires cueing 25 - 50% of the time. Needs to repeat parts of sentences. Social  Interaction Social Interaction: 4-Interacts appropriately 75 - 89% of the time - Needs redirection for appropriate language or to initiate interaction. Problem Solving Problem Solving: 2-Solves basic 25 - 49% of the time - needs direction more than half the time to initiate, plan or complete simple activities Memory Memory: 3-Recognizes or recalls 50 - 74% of the time/requires cueing 25 - 49% of the time FIM - Eating Eating Activity: 5: Supervision/cues;5: Set-up assist for open containers  Pain No/Denies Pain   Therapy/Group: Individual Therapy  Ladarrius Bogdanski 10/31/2012, 4:15 PM  [place], [place], [place]

## 2012-10-31 NOTE — Progress Notes (Signed)
Physical Therapy Weekly Progress Note  Patient Details  Name: [name] MRN: [id number] Date of Birth: [birth year]  Today's Date: 10/31/2012 Time: 0730-0829 Time Calculation (min): 59 min  Patient has met 3 of 3 short term goals.  Pt making good gains and is currently at min assist level for short distance gait. 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    Patient continues to demonstrate the following deficits: impaired balance during standing activities, decreased functional mobility, decreased ability to safely ambulate, increased need for support and therefore will continue to benefit from skilled PT intervention to enhance overall performance with activity tolerance, balance and ability to compensate for deficits.  Patient progressing toward long term goals..  Continue plan of care.  PT Short Term Goals Week 1:  PT Short Term Goal 1 (Week 1): Pt will transfer with S from various surface heights  PT Short Term Goal 1 - Progress (Week 1): Met PT Short Term Goal 2 (Week 1): Pt will gait x 25' with min A PT Short Term Goal 2 - Progress (Week 1): Met PT Short Term Goal 3 (Week 1): Pt will demonstrate dynamic standing balance during functional tasks with steady A PT Short Term Goal 3 - Progress (Week 1): Met  Skilled Therapeutic Interventions/Progress Updates:    Pt reports TEDS and socks are uncomfortable because they make him hot and aggravate his fibromyalgia, only willing to wear socks. Ambulation from room to gym with one seated rest break! (~100', 15') with RW and min-guard assist, chair close to follow. Pt continues with wide base of support, difficulty correcting with cues. Standing balance activity with narrowed base of support implementing vestibular VOR x 1 exercises without UE support, min assist intermittently for balance. Verbal cues needed for correct sequencing and  mechanics of exercise. NuStep x  4 min level 7 (per pt request), pt fatigued quickly. Pt was very motivated this session and excited about walking all the way to the gym.   Pt SpO2 >94% on room air throughout session. Pt reports his vision is improving, has been performing vestibular exercises in room and reports some improvement during mobility with visual targeting technique.   Therapy Documentation Precautions:  Precautions Precautions: Fall Precaution Comments: dizziness with mobility, deaf R ear, blind R eye, double vision, open stoma Restrictions Weight Bearing Restrictions: No Pain:  8/10 pain at surgical closure site on neck. Reports he will call for medication at end of session.  See FIM for current functional status  Therapy/Group: Individual Therapy  Lahoma Rocker 10/31/2012, 11:49 AM

## 2012-11-01 ENCOUNTER — Inpatient Hospital Stay (HOSPITAL_COMMUNITY): Payer: BC Managed Care – PPO

## 2012-11-01 ENCOUNTER — Inpatient Hospital Stay (HOSPITAL_COMMUNITY): Payer: BC Managed Care – PPO | Admitting: Physical Therapy

## 2012-11-01 ENCOUNTER — Inpatient Hospital Stay (HOSPITAL_COMMUNITY): Payer: BC Managed Care – PPO | Admitting: Speech Pathology

## 2012-11-01 ENCOUNTER — Inpatient Hospital Stay (HOSPITAL_COMMUNITY): Payer: BC Managed Care – PPO | Admitting: *Deleted

## 2012-11-01 DIAGNOSIS — G519 Disorder of facial nerve, unspecified: Secondary | ICD-10-CM

## 2012-11-01 DIAGNOSIS — E669 Obesity, unspecified: Secondary | ICD-10-CM

## 2012-11-01 DIAGNOSIS — R269 Unspecified abnormalities of gait and mobility: Secondary | ICD-10-CM

## 2012-11-01 DIAGNOSIS — R5381 Other malaise: Secondary | ICD-10-CM

## 2012-11-01 DIAGNOSIS — D333 Benign neoplasm of cranial nerves: Secondary | ICD-10-CM

## 2012-11-01 DIAGNOSIS — J209 Acute bronchitis, unspecified: Secondary | ICD-10-CM

## 2012-11-01 LAB — BASIC METABOLIC PANEL
CO2: 37 mEq/L — ABNORMAL HIGH (ref 19–32)
Calcium: 9.4 mg/dL (ref 8.4–10.5)
Chloride: 98 mEq/L (ref 96–112)
Creatinine, Ser: 0.85 mg/dL (ref 0.50–1.35)
GFR calc Af Amer: >90 mL/min (ref >90)
Sodium: 140 mEq/L (ref 135–145)

## 2012-11-01 LAB — GLUCOSE, CAPILLARY
Glucose-Capillary: 112 mg/dL — ABNORMAL HIGH (ref 70–99)
Glucose-Capillary: 144 mg/dL — ABNORMAL HIGH (ref 70–99)
Glucose-Capillary: 151 mg/dL — ABNORMAL HIGH (ref 70–99)

## 2012-11-01 LAB — CBC
MCV: 92.9 fL (ref 78.0–100.0)
Platelets: 271 10*3/uL (ref 150–400)
RBC: 4.39 MIL/uL (ref 4.22–5.81)
RDW: 14.8 % (ref 11.5–15.5)
WBC: 8.5 10*3/uL (ref 4.0–10.5)

## 2012-11-01 MED ORDER — PREDNISONE 20 MG PO TABS
20.0000 mg | ORAL_TABLET | Freq: Two times a day (BID) | ORAL | Status: DC
  Administered 2012-11-01 – 2012-11-03 (×6): 20 mg via ORAL
  Filled 2012-11-01 (×9): qty 1

## 2012-11-01 NOTE — Progress Notes (Signed)
Speech Language Pathology Daily Session Note  Patient Details  Name: [name] MRN: [id number] Date of Birth: March 10, 1961  Today's Date: 11/01/2012 Time: 0830-0900 Time Calculation (min): 30 min  Short Term Goals: Week 2: SLP Short Term Goal 1 (Week 2): Pt will recall newly learned information with Mod A question and visual cues. SLP Short Term Goal 2 (Week 2): Pt will demonstrate selective attention to a functional task for 15 minutes with Mod A verbal cues for redirection SLP Short Term Goal 3 (Week 2): Pt will utilize swallowing compensatory startegies to minimize right buccal pocketing and anterior loss with Min A verbal and question cues.  SLP Short Term Goal 4 (Week 2): pt will apply pressure to his stoma to increase vocal intensity at the phrase level with Min A verbal and visual cues.   Skilled Therapeutic Interventions: Treatment focus on speech goals. SLP facilitated session by providing min verbal and visual cues to increase carryover of speech strategy of applying pressure to his stoma to increase vocal intensity. Pt was ~90% intelligible at the sentence level throughout functional conversation. Pt performed labial strengthening and ROM exercises and Mod A verbal and visual cues. Pt also required Min question cues to recall swallowing compensatory strategies to utilize to minimize oral dysphagia and right anterior spillage.    FIM:  Comprehension Comprehension Mode: Auditory Comprehension: 5-Understands basic 90% of the time/requires cueing < 10% of the time Expression Expression Mode: Verbal Expression: 4-Expresses basic 75 - 89% of the time/requires cueing 10 - 24% of the time. Needs helper to occlude trach/needs to repeat words. Social Interaction Social Interaction: 5-Interacts appropriately 90% of the time - Needs monitoring or encouragement for participation or interaction. Problem Solving Problem Solving: 4-Solves basic 75 - 89% of the time/requires cueing 10 -  24% of the time Memory Memory: 4-Recognizes or recalls 75 - 89% of the time/requires cueing 10 - 24% of the time  Pain Pain Assessment Pain Assessment: 0-10 Pain Score:   6 Pain Type: Acute pain Pain Location: Head Pain Orientation: Right Pain Descriptors: Headache Pain Onset: Gradual Patients Stated Pain Goal: 2 Pain Intervention(s): RN made aware  Therapy/Group: Individual Therapy  Jesenia Spera 11/01/2012, 1:19 PM

## 2012-11-01 NOTE — Progress Notes (Signed)
Physical Therapy Session Note  Patient Details  Name: [name] MRN: [id number] Date of Birth: 03-03-1961  Today's Date: 11/01/2012 Time: [id number] Time Calculation (min): 27 min  Short Term Goals: Week 2:  PT Short Term Goal 1 (Week 2): Pt will ambulate 74' with supervision and RW PT Short Term Goal 2 (Week 2): Pt will perform vestibular exercises with only supervision cues needed PT Short Term Goal 3 (Week 2): Pt will transfer wheelchair <> mat with modified independence  Skilled Therapeutic Interventions/Progress Updates:    Pt reports at start of treatment "I hate to disappoint you but I just don't think I can do it today." Pt feels head and bil. Feet hurt too much and he has too much dizziness to mobilize today. Pt encouraged to try to ambulate with PT however he declined and refused to don socks secondary to pain. Pt willing to participate in vestibular exercises at far and near distances. Sitting EOB pt performed long arc quads and ankle pumps 3 x 10 reps. Pt again refused standing mobility and ambulation. Pt missed 18 min skilled PT due to pain, fatigue, and dizziness.   Therapy Documentation Precautions:  Precautions Precautions: Fall Precaution Comments: dizziness with mobility, deaf R ear, blind R eye, double vision, open stoma Restrictions Weight Bearing Restrictions: No General: Amount of Missed PT Time (min): 18 Minutes Missed Time Reason: Patient fatigue (pain) Pain: Pain Assessment Pain Assessment: 0-10 Pain Score:   8 Pain Type: Acute pain Pain Location: Head Pain Orientation: Right Pain Descriptors: Aching;Headache Pain Onset: On-going Patients Stated Pain Goal: 2 Pain Intervention(s): RN made aware Multiple Pain Sites: Yes 2nd Pain Site Pain Score: 8 Pain Type: Acute pain Pain Location: Leg Pain Orientation: Left;Right Pain Descriptors: Aching;Constant Pain Onset: On-going (worse with activity) Pain Intervention(s): RN made aware  See FIM for  current functional status  Therapy/Group: Individual treatment  Lahoma Rocker 11/01/2012, 1:32 PM

## 2012-11-01 NOTE — Progress Notes (Signed)
Physical Therapy Note  Patient Details  Name: [name] MRN: [id number] Date of Birth: 02/03/1961 Today's Date: 11/01/2012  Patient missed 30 minutes skilled physical therapy this PM secondary to refusal to participate due to c/o pain and headache. RN notified. Will follow up as able.  [place] Calene Paradiso, PT, DPT  11/01/2012, 3:18 PM

## 2012-11-01 NOTE — Progress Notes (Addendum)
Patient ID: [name], male   DOB: June 02, 1961, 52 y.o.   MRN: [id number] Subjective/Complaints: Speaking better. Cough/secretions decreased as a whole  A 12 point review of systems has been performed and if not noted above is otherwise negative.   Objective: Vital Signs: Blood pressure 124/74, pulse 62, temperature 98 F (36.7 C), temperature source Oral, resp. rate 19, weight 153.9 kg (339 lb 4.6 oz), SpO2 95.00%. No results found.  Recent Labs  11/01/12 0700  WBC 8.5  HGB 13.0  HCT 40.8  PLT 271    Recent Labs  11/01/12 0700  NA 140  K 3.9  CL 98  GLUCOSE 118*  BUN 13  CREATININE 0.85  CALCIUM 9.4   CBG (last 3)   Recent Labs  10/31/12 1635 10/31/12 2121 11/01/12 0708  GLUCAP 108* 122* 105*    Wt Readings from Last 3 Encounters:  10/30/12 153.9 kg (339 lb 4.6 oz)  10/21/12 149.7 kg (330 lb 0.5 oz)  10/21/12 149.7 kg (330 lb 0.5 oz)    Physical Exam:  Constitutional: He is oriented to person, place, and time. He appears well-developed and well-nourished.  Morbidly obese HENT:  Head: Normocephalic and atraumatic.  Mouth/Throat: Abnormal dentition. Dental caries present. Oropharyngeal exudate present.  Neck: Normal range of motion.  Trach site sutured closed. Slight, yellow drainage from wound but decreased. Some redness around wound. Cardiovascular: Normal rate and regular rhythm.  Pulmonary/Chest: Effort normal.   no wheezes a few rhonchi. No distress.  Abdominal: Soft. Bowel sounds are normal. He exhibits no distension. There is no tenderness.  Musculoskeletal: He exhibits edema (1+ pedal edema.). Marland Kitchen Both feet/ankles warm, slightly red/tender to palpation Bilateral fore and mid foot as well as toes are pink  +pedal pulses felt.  Neurological: He is alert and oriented to person, place, and time.  Dysphonic, but better when he holds stoma site with fingers. Right facial droop,lower facial and frontal droop, unable to close right eyelid. Impulsive.  Some improvement insight and awareness.  Able to follow basic commands without difficulty. BUE with weakness and decrease in fine motor movements right greater than left. Strength grossly   3+ UE,  3, LE's. Good sitting balance Skin: Skin is warm and dry.   Stasis changes bilateral shins. Heels with dry skin.  Psych: more relaxed   Assessment/Plan: 1. 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 Physiatrist is providing close team supervision and 24 hour management of active medical problems listed below. Physiatrist and rehab team continue to assess barriers to discharge/monitor patient progress toward functional and medical goals.  Still with cognitive deficits above baseline.  FIM: FIM - Bathing Bathing Steps Patient Completed: Chest;Right Arm;Left Arm;Abdomen;Front perineal area;Right upper leg;Left upper leg Bathing: 3: Mod-Patient completes 5-7 40f 10 parts or 50-74%  FIM - Upper Body Dressing/Undressing Upper body dressing/undressing: 0: Wears gown/pajamas-no public clothing FIM - Lower Body Dressing/Undressing Lower body dressing/undressing: 0: Wears gown/pajamas-no public clothing  FIM - Toileting Toileting steps completed by patient: Adjust clothing prior to toileting;Adjust clothing after toileting Toileting Assistive Devices: Grab bar or rail for support Toileting: 4: Steadying assist  FIM - Radio producer Devices: Insurance account manager Transfers: 5-To toilet/BSC: Supervision (verbal cues/safety issues)  FIM - Control and instrumentation engineer Devices: Walker;HOB elevated;Bed rails Bed/Chair Transfer: 5: Supine > Sit: Supervision (verbal cues/safety issues);5: Bed >  Chair or W/C: Supervision (verbal cues/safety issues)  FIM  - Locomotion: Wheelchair Locomotion: Wheelchair: 5: Travels 150 ft or more: maneuvers on rugs and over door sills with supervision, cueing or coaxing FIM - Locomotion: Ambulation Locomotion: Ambulation Assistive Devices: Administrator Ambulation/Gait Assistance: 4: Min assist Locomotion: Ambulation: 2: Travels 50 - 149 ft with minimal assistance (Pt.>75%)  Comprehension Comprehension Mode: Auditory Comprehension: 4-Understands basic 75 - 89% of the time/requires cueing 10 - 24% of the time  Expression Expression Mode: Verbal Expression Assistive Devices: 6-Other (Comment) Expression: 3-Expresses basic 50 - 74% of the time/requires cueing 25 - 50% of the time. Needs to repeat parts of sentences.  Social Interaction Social Interaction: 4-Interacts appropriately 75 - 89% of the time - Needs redirection for appropriate language or to initiate interaction.  Problem Solving Problem Solving Mode: Not assessed Problem Solving: 2-Solves basic 25 - 49% of the time - needs direction more than half the time to initiate, plan or complete simple activities  Memory Memory Mode: Not assessed Memory: 3-Recognizes or recalls 50 - 74% of the time/requires cueing 25 - 49% of the time  Medical Problem List and Plan:  1. DVT Prophylaxis/Anticoagulation: Mechanical: Antiembolism stockings, knee (TED hose) Bilateral lower extremities  Sequential compression devices, below knee Bilateral lower extremities.  Add lovenox.  Will check dopplers.  2. Pain Management: N/A.   prn ultram.   -add prednisone for likely gout flare 3. Mood: Provide ego support and redirection. LCSW to follow for evaluation.  4. Neuropsych: This patient is capable of making decisions on his/her own behalf.  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 Continue Lacrilube tid with patching at nights to prevent corneal abrasion. Eye patch at night. ?oculoplastics for gold pellet in eyelid 6. CAD: continue atenolol bid. Patient has declined  statin per reports.  7. DM type 2- diet controlled: Will monitor BS with AC/HS CBG checks. Use SSI for elevated BS. Currently 120-200 range likely due to stress and stress dose steroids. Fair control at present 8. Chronic LE edema: Low salt diet. Added TEDs. Needs to elevate feet. 9. Leukocytosis: improving  -ENT closed stoma at bedside  -ceftazidime  -secretions better  -wbc's decreased  -encouraged patient to work on coughing up secretions and to apply pressure over stoma site 8. HTN: Monitor with bid checks. Continue tenormin bid. Blood pressures have been variable and will adjust as needed.  9. Acute renal insufficiency: resolved  LOS (Days) 10 A FACE TO FACE EVALUATION WAS PERFORMED  SWARTZ,ZACHARY T 11/01/2012 8:17 AM

## 2012-11-01 NOTE — Plan of Care (Signed)
Problem: RH PAIN MANAGEMENT Goal: RH STG PAIN MANAGED AT OR BELOW PT'S PAIN GOAL Less than or equal to 3.  Outcome: Not Progressing Headache dizziness not managed with prn medications

## 2012-11-01 NOTE — Progress Notes (Signed)
Occupational Therapy Session Note  Patient Details  Name: [name] MRN: [id number] Date of Birth: May 21, 1961  Today's Date: 11/01/2012 Time: 1000-1057 Time Calculation (min): 57 min  Short Term Goals: Week 2:  OT Short Term Goal 1 (Week 2): Pt will thread pants during LB dressing with min A using AE PRN OT Short Term Goal 2 (Week 2): Pt will perform toileting tasks with steady A OT Short Term Goal 3 (Week 2): Pt will amb with RW into bathroom for toilet transfers with supervision OT Short Term Goal 4 (Week 2): Pt will complete bathing tasks with min A  Skilled Therapeutic Interventions/Progress Updates:    Pt in bed with BLE elevated.  Pt requested to perform bathing and dressing tasks at sink level but declined to don clothing today.  Pt declined donning Ted hose even after informed of benefits.  Pt amb with RW from bed to sink and stood at sink to complete bathing front perineal area.  Pt requested to use bathroom and amb with RW to toilet.  Pt completed toilet hygiene with supervision only.  Focus on activity tolerance, functional amb with RW, dynamic standing balance, and safety awareness.  Therapy Documentation Precautions:  Precautions Precautions: Fall Precaution Comments: dizziness with mobility, deaf R ear, blind R eye, double vision, open stoma Restrictions Weight Bearing Restrictions: No Pain: Pain Assessment Pain Assessment: 0-10 Pain Score:   6 Pain Type: Acute pain Pain Location: Head Pain Orientation: Right Pain Descriptors: Headache Pain Onset: Gradual Patients Stated Pain Goal: 2 Pain Intervention(s): RN made aware  See FIM for current functional status  Therapy/Group: Individual Therapy  Leroy Libman 11/01/2012, 11:02 AM

## 2012-11-01 NOTE — Progress Notes (Signed)
Occupational Therapy Note  Patient Details  Name: NIX ELZEY MRN: [id number] Date of Birth: February 01, 1961 Today's Date: 11/01/2012  Pt missed 30 mins skilled OT services due to fatigue, BLE pain, and dizzyness.  Pt resting in bed and stated he just couldn't do anything else today.  Pt expressed concern about bilateral feet.   Leotis Shames [facility] 11/01/2012, 2:20 PM

## 2012-11-02 ENCOUNTER — Inpatient Hospital Stay (HOSPITAL_COMMUNITY): Payer: BC Managed Care – PPO | Admitting: Physical Therapy

## 2012-11-02 LAB — GLUCOSE, CAPILLARY
Glucose-Capillary: 117 mg/dL — ABNORMAL HIGH (ref 70–99)
Glucose-Capillary: 176 mg/dL — ABNORMAL HIGH (ref 70–99)

## 2012-11-02 MED ORDER — WHITE PETROLATUM GEL
Status: AC
  Administered 2012-11-02: 10:00:00
  Filled 2012-11-02: qty 5

## 2012-11-02 NOTE — Progress Notes (Signed)
Physical Therapy Session Note  Patient Details  Name: [name] MRN: [id number] Date of Birth: Feb 20, 1961  Today's Date: 11/02/2012 Time: 0900-0957 Time Calculation (min): 57 min  Short Term Goals: Week 1:  PT Short Term Goal 1 (Week 1): Pt will transfer with S from various surface heights  PT Short Term Goal 1 - Progress (Week 1): Met PT Short Term Goal 2 (Week 1): Pt will gait x 25' with min A PT Short Term Goal 2 - Progress (Week 1): Met PT Short Term Goal 3 (Week 1): Pt will demonstrate dynamic standing balance during functional tasks with steady A PT Short Term Goal 3 - Progress (Week 1): Met  Skilled Therapeutic Interventions/Progress Updates:    Pt slow to initiate each activity, likes to discuss symptoms and experience. Once pt mobilizes he has good motivation. Ambulation x 100', 54' with RW and wheelchair to follow, steady assist for balance. Pt has very wide base of support secondary to body habitus and decreased stability and is unable to correct with cues. Pt engaged in standing balance activity without UE support tossing red weighted ball against rebounder, heavy min assist for balance. Wheelchair propulsion x 150' for strengthening and endurance. Pt on room air throughout treatment, SpO2 down to 87 at lowest, pt able to return to 95% on room air with seated rest. Pt seated in wheelchair at end of treatment with legs elevated. Reviewed pressure relief techniques to be performed every 30 min, pt verbalizes understanding.   Therapy Documentation Precautions:  Precautions Precautions: Fall Precaution Comments: dizziness with mobility, deaf R ear, blind R eye, double vision, open stoma Restrictions Weight Bearing Restrictions: No Vital Signs: Oxygen Therapy SpO2: 96 % O2 Device: Nasal cannula O2 Flow Rate (L/min): 2 L/min Pain: Pain Assessment Pain Assessment: 0-10 Pain Score:   8 Pain Type: Acute pain Pain Location: bil. Feet/leg Pain Orientation: Right/Lt Pain  Descriptors: throbbing, tight Pain Onset: On-going Pain Intervention(s): Rn made aware Locomotion : Ambulation Ambulation/Gait Assistance: 4: Min assist   See FIM for current functional status  Therapy/Group: Individual Therapy  Lahoma Rocker 11/02/2012, 11:54 AM

## 2012-11-02 NOTE — Progress Notes (Signed)
Patient ID: JERI SEEVER, male   DOB: Nov 14, 1960, 52 y.o.   MRN: [id number] Patient ID: MARQUI DEHAVEN, male   DOB: 10/11/1960, 52 y.o.   MRN: [id number] Subjective/Complaints:   44/2.  52 year old patient history of morbid obesity with OSA status post resection of a right acoustic neuroma on February 21. Patient has a history of impaired glucose tolerance coronary artery disease. Postoperative  course complicated by respiratory failure. Patient is asking to advance from a pureed diet if possible  BP Readings from Last 3 Encounters:  11/02/12 138/75  10/22/12 116/82  10/22/12 116/82    CBG (last 3)   Recent Labs  11/01/12 1645 11/01/12 2045 11/02/12 0749  GLUCAP 144* 151* 111*    Gen.-Obese speech difficulty to tracheostomy;  requires frequent self suctioning for oral secretions ENT-right facial nerve weakness Chest clear Cardiovascular -no tachycardia Abdomen obese soft nontender Extremities +2 edema  Impression status post resection of a right acoustic neuroma with postoperative by 3 failure Morbid obesity Coronary artery disease stable  Objective: Vital Signs: Blood pressure 138/75, pulse 77, temperature 98.4 F (36.9 C), temperature source Oral, resp. rate 18, weight 153.9 kg (339 lb 4.6 oz), SpO2 95.00%. No results found.  Recent Labs  11/01/12 0700  WBC 8.5  HGB 13.0  HCT 40.8  PLT 271    Recent Labs  11/01/12 0700  NA 140  K 3.9  CL 98  GLUCOSE 118*  BUN 13  CREATININE 0.85  CALCIUM 9.4   CBG (last 3)   Recent Labs  11/01/12 1645 11/01/12 2045 11/02/12 0749  GLUCAP 144* 151* 111*    Wt Readings from Last 3 Encounters:  10/30/12 153.9 kg (339 lb 4.6 oz)  10/21/12 149.7 kg (330 lb 0.5 oz)  10/21/12 149.7 kg (330 lb 0.5 oz)    Physical Exam:  Constitutional: He is oriented to person, place, and time. He appears well-developed and well-nourished.  Morbidly obese HENT:  Head: Normocephalic and atraumatic.  Mouth/Throat: Abnormal  dentition. Dental caries present. Oropharyngeal exudate present.  Neck: Normal range of motion.  Trach site sutured closed. Slight, yellow drainage from wound but decreased. Some redness around wound. Cardiovascular: Normal rate and regular rhythm.  Pulmonary/Chest: Effort normal.   no wheezes a few rhonchi. No distress.  Abdominal: Soft. Bowel sounds are normal. He exhibits no distension. There is no tenderness.  Musculoskeletal: He exhibits edema (1+ pedal edema.). Marland Kitchen Both feet/ankles warm, slightly red/tender to palpation Bilateral fore and mid foot as well as toes are pink  +pedal pulses felt.  Neurological: He is alert and oriented to person, place, and time.  Dysphonic, but better when he holds stoma site with fingers. Right facial droop,lower facial and frontal droop, unable to close right eyelid. Impulsive. Some improvement insight and awareness.  Able to follow basic commands without difficulty. BUE with weakness and decrease in fine motor movements right greater than left. Strength grossly   3+ UE,  3, LE's. Good sitting balance Skin: Skin is warm and dry.   Stasis changes bilateral shins. Heels with dry skin.  Psych: more relaxed   Assessment/Plan: 1. 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 Physiatrist is providing close team supervision and 24 hour management of active medical problems listed below. Physiatrist and rehab team continue to assess barriers to discharge/monitor patient progress toward functional and medical goals.  Still with cognitive deficits above baseline.  FIM: FIM - Bathing Bathing Steps Patient Completed: Chest;Right Arm;Left Arm;Abdomen;Front perineal area;Right upper leg;Left upper leg Bathing: 3: Mod-Patient completes 5-7 73f 10 parts or  50-74%  FIM - Upper Body Dressing/Undressing Upper body dressing/undressing: 0: Wears gown/pajamas-no public clothing FIM - Lower Body Dressing/Undressing Lower body dressing/undressing: 0: Wears gown/pajamas-no public clothing  FIM - Toileting Toileting steps completed by patient: Adjust clothing prior to toileting;Performs perineal hygiene;Adjust clothing after toileting Toileting Assistive Devices: Grab bar or rail for support Toileting: 4: Steadying assist  FIM - Radio producer Devices: Elevated toilet seat Toilet Transfers: 5-To toilet/BSC: Supervision (verbal cues/safety issues);5-From toilet/BSC: Supervision (verbal cues/safety issues)  FIM - Engineer, site Assistive Devices: Walker;HOB elevated;Bed rails Bed/Chair Transfer: 0: Activity did not occur  FIM - Locomotion: Wheelchair Locomotion: Wheelchair: 0: Activity did not occur FIM - Locomotion: Ambulation Locomotion: Ambulation Assistive Devices: Administrator Ambulation/Gait Assistance: 4: Min assist Locomotion: Ambulation: 0: Activity did not occur  Comprehension Comprehension Mode: Auditory Comprehension: 5-Understands basic 90% of the time/requires cueing < 10% of the time  Expression Expression Mode: Verbal Expression Assistive Devices: 6-Other (Comment) Expression: 4-Expresses basic 75 - 89% of the time/requires cueing 10 - 24% of the time. Needs helper to occlude trach/needs to repeat words.  Social Interaction Social Interaction: 5-Interacts appropriately 90% of the time - Needs monitoring or encouragement for participation or interaction.  Problem Solving Problem Solving Mode: Not assessed Problem Solving: 4-Solves basic 75 - 89% of the time/requires cueing 10 - 24% of the time  Memory Memory Mode: Not assessed Memory: 4-Recognizes or recalls 75 - 89% of the time/requires cueing 10 - 24% of the time  Medical Problem List and Plan:  1. DVT  Prophylaxis/Anticoagulation: Mechanical: Antiembolism stockings, knee (TED hose) Bilateral lower extremities  Sequential compression devices, below knee Bilateral lower extremities.  Add lovenox.  Will check dopplers.  2. Pain Management: N/A.   prn ultram.   -add prednisone for likely gout flare 3. Mood: Provide ego support and redirection. LCSW to follow for evaluation.  4. Neuropsych: This patient is capable of making decisions on his/her own behalf.  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 Continue Lacrilube tid with patching at nights to prevent corneal abrasion. Eye patch at night. ?oculoplastics for gold pellet in eyelid 6. CAD: continue atenolol bid. Patient has declined statin per reports.  7. DM type 2- diet controlled: Will monitor BS with AC/HS CBG checks. Use SSI for elevated BS. Currently 120-200 range likely due to stress and stress dose steroids. Fair control at present 8. Chronic LE edema: Low salt diet. Added TEDs. Needs to elevate feet. 9. Leukocytosis: improving  -ENT closed stoma at bedside  -ceftazidime  -secretions better  -wbc's decreased  -encouraged patient to work on coughing up secretions and to apply pressure over stoma site 8. HTN: Monitor with bid checks. Continue tenormin bid. Blood pressures have been variable and will adjust as needed.  9. Acute renal insufficiency: resolved  LOS (Days) 11 A FACE TO FACE EVALUATION WAS PERFORMED  Nyoka Cowden 11/02/2012 9:29 AM

## 2012-11-02 NOTE — Plan of Care (Signed)
Problem: RH PAIN MANAGEMENT Goal: RH STG PAIN MANAGED AT OR BELOW PT'S PAIN GOAL Less than or equal to 3.  Outcome: Not Progressing Pt pain level above 3 continually/ongoing.

## 2012-11-03 ENCOUNTER — Inpatient Hospital Stay (HOSPITAL_COMMUNITY): Payer: BC Managed Care – PPO | Admitting: Occupational Therapy

## 2012-11-03 LAB — GLUCOSE, CAPILLARY
Glucose-Capillary: 121 mg/dL — ABNORMAL HIGH (ref 70–99)
Glucose-Capillary: 161 mg/dL — ABNORMAL HIGH (ref 70–99)

## 2012-11-03 NOTE — Progress Notes (Signed)
Occupational Therapy Session Note  Patient Details  Name: [name] MRN: [id number] Date of Birth: [birth year]  Today's Date: 11/03/2012 Time: 0940-1100 Time Calculation (min): 80 min  Skilled Therapeutic Interventions/Progress Updates:   Patient stated his vision was different in each eye and that he could not see clearly and sometimes saw double or triple.  ADL in w/c at sink after patient required extra time to complete toileting process.   He said he was not able to focus on having a BM and bathing while sitting on toilet.   He voided on bathroom floor while having BM.  This clinican asked if he was aware that he was voiding on the floor, and he stated, "yes."  When asked if he needed assistance to place his penis into toilet so the pee would go in the toilet, he stated, "No."  Patient required assistance to clean periarea due to difficulty reaching back to buttocks.  He was able to clean front periarea by scooting forward in w/c at sink during his ADL.  Patient not able to reach feet today.  Patient with good balance and good activity tolerance for functional mobility from bed to toilet and for standing while clinician cleansed buttocks after BM.  Therapy Documentation Precautions:  Precautions Precautions: Fall Precaution Comments: dizziness with mobility, deaf R ear, blind R eye, double vision, open stoma Restrictions Weight Bearing Restrictions: No  Pain: no complaints of   See FIM for current functional status  Therapy/Group: Individual Therapy  Alfredia Ferguson [facility] 11/03/2012, 4:25 PM

## 2012-11-03 NOTE — Progress Notes (Signed)
Patient ID: [name], male   DOB: 1961/05/01, 52 y.o.   MRN: [id number] Patient ID: [name], male   DOB: 1961/04/05, 52 y.o.   MRN: [id number] Patient ID: [name], male   DOB: [birth year], 52 y.o.   MRN: [id number] Subjective/Complaints:   3/16 a.m.Marland Kitchen  52 year old patient history of morbid obesity with OSA status post resection of a right acoustic neuroma on February 21. Patient has a history of impaired glucose tolerance coronary artery disease. Postoperative  course complicated by respiratory failure. Patient is asking to advance from a pureed diet if possible.  Remains on [place]  BP Readings from Last 3 Encounters:  11/03/12 137/78  10/22/12 116/82  10/22/12 116/82    CBG (last 3)   Recent Labs  11/02/12 1645 11/02/12 2113 11/03/12 0732  GLUCAP 176* 150* 148*    Gen.-Obese speech difficulty to tracheostomy;  requires frequent self suctioning for oral secretions ENT-right facial nerve weakness Chest clear Cardiovascular -no tachycardia Abdomen obese soft nontender Extremities +2 edema  Impression status post resection of a right acoustic neuroma with postoperative respiratory failure  Morbid obesity Coronary artery disease stable DM- stable   Objective: Vital Signs: Blood pressure 137/78, pulse 68, temperature 98.2 F (36.8 C), temperature source Oral, resp. rate 18, weight 153.9 kg (339 lb 4.6 oz), SpO2 94.00%. No results found.  Recent Labs  11/01/12 0700  WBC 8.5  HGB 13.0  HCT 40.8  PLT 271    Recent Labs  11/01/12 0700  NA 140  K 3.9  CL 98  GLUCOSE 118*  BUN 13  CREATININE 0.85  CALCIUM 9.4   CBG (last 3)   Recent Labs  11/02/12 1645 11/02/12 2113 11/03/12 0732  GLUCAP 176* 150* 148*    Wt Readings from Last 3 Encounters:  10/30/12 153.9 kg (339 lb 4.6 oz)  10/21/12 149.7 kg (330 lb 0.5 oz)  10/21/12 149.7 kg (330 lb 0.5 oz)    Physical Exam:  Constitutional: He is oriented to person, place, and time. He  appears well-developed and well-nourished.  Morbidly obese HENT:  Head: Normocephalic and atraumatic.  Mouth/Throat: Abnormal dentition. Dental caries present. Oropharyngeal exudate present.  Neck: Normal range of motion.  Trach site sutured closed. Slight, yellow drainage from wound but decreased. Some redness around wound. Cardiovascular: Normal rate and regular rhythm.  Pulmonary/Chest: Effort normal.   no wheezes a few rhonchi. No distress.  Abdominal: Soft. Bowel sounds are normal. He exhibits no distension. There is no tenderness.  Musculoskeletal: He exhibits edema (1+ pedal edema.). Marland Kitchen Both feet/ankles warm, slightly red/tender to palpation Bilateral fore and mid foot as well as toes are pink  +pedal pulses felt.  Neurological: He is alert and oriented to person, place, and time.  Dysphonic, but better when he holds stoma site with fingers. Right facial droop,lower facial and frontal droop, unable to close right eyelid. Impulsive. Some improvement insight and awareness.  Able to follow basic commands without difficulty. BUE with weakness and decrease in fine motor movements right greater than left. Strength grossly   3+ UE,  3, LE's. Good sitting balance Skin: Skin is warm and dry.   Stasis changes bilateral shins. Heels with dry skin.  Psych: more relaxed   Assessment/Plan: 1. 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 Physiatrist is providing close team supervision and 24 hour  management of active medical problems listed below. Physiatrist and rehab team continue to assess barriers to discharge/monitor patient progress toward functional and medical goals.  Still with cognitive deficits above baseline.  FIM: FIM - Bathing Bathing Steps Patient Completed: Chest;Right  Arm;Left Arm;Abdomen;Front perineal area;Right upper leg;Left upper leg Bathing: 3: Mod-Patient completes 5-7 61f 10 parts or 50-74%  FIM - Upper Body Dressing/Undressing Upper body dressing/undressing: 0: Wears gown/pajamas-no public clothing FIM - Lower Body Dressing/Undressing Lower body dressing/undressing: 0: Wears gown/pajamas-no public clothing  FIM - Toileting Toileting steps completed by patient: Adjust clothing prior to toileting;Performs perineal hygiene;Adjust clothing after toileting Toileting Assistive Devices: Grab bar or rail for support Toileting: 4: Steadying assist  FIM - Radio producer Devices: Elevated toilet seat Toilet Transfers: 5-To toilet/BSC: Supervision (verbal cues/safety issues);5-From toilet/BSC: Supervision (verbal cues/safety issues)  FIM - Engineer, site Assistive Devices: Walker;HOB elevated;Bed rails Bed/Chair Transfer: 0: Activity did not occur  FIM - Locomotion: Wheelchair Locomotion: Wheelchair: 5: Travels 150 ft or more: maneuvers on rugs and over door sills with supervision, cueing or coaxing FIM - Locomotion: Ambulation Locomotion: Ambulation Assistive Devices: Administrator Ambulation/Gait Assistance: 4: Min assist Locomotion: Ambulation: 2: Travels 50 - 149 ft with minimal assistance (Pt.>75%)  Comprehension Comprehension Mode: Auditory Comprehension: 5-Understands basic 90% of the time/requires cueing < 10% of the time  Expression Expression Mode: Verbal Expression Assistive Devices: 6-Other (Comment) Expression: 3-Expresses basic 50 - 74% of the time/requires cueing 25 - 50% of the time. Needs to repeat parts of sentences.  Social Interaction Social Interaction: 5-Interacts appropriately 90% of the time - Needs monitoring or encouragement for participation or interaction.  Problem Solving Problem Solving Mode: Not assessed Problem Solving: 4-Solves basic 75 - 89% of the  time/requires cueing 10 - 24% of the time  Memory Memory Mode: Not assessed Memory: 4-Recognizes or recalls 75 - 89% of the time/requires cueing 10 - 24% of the time  Medical Problem List and Plan:  1. DVT Prophylaxis/Anticoagulation: Mechanical: Antiembolism stockings, knee (TED hose) Bilateral lower extremities  Sequential compression devices, below knee Bilateral lower extremities.  Add lovenox.  Will check dopplers.  2. Pain Management: N/A.   prn ultram.   -add prednisone for likely gout flare 3. Mood: Provide ego support and redirection. LCSW to follow for evaluation.  4. Neuropsych: This patient is capable of making decisions on his/her own behalf.  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 Continue Lacrilube tid with patching at nights to prevent corneal abrasion. Eye patch at night. ?oculoplastics for gold pellet in eyelid 6. CAD: continue atenolol bid. Patient has declined statin per reports.  7. DM type 2- diet controlled: Will monitor BS with AC/HS CBG checks. Use SSI for elevated BS. Currently 120-200 range likely due to stress and stress dose steroids. Fair control at present 8. Chronic LE edema: Low salt diet. Added TEDs. Needs to elevate feet. 9. Leukocytosis: improving  -ENT closed stoma at bedside  -ceftazidime  -secretions better  -wbc's decreased  -encouraged patient to work on coughing up secretions and to apply pressure over stoma site 8. HTN: Monitor with bid checks. Continue tenormin bid. Blood pressures have been variable and will adjust as needed.  9. Acute renal insufficiency: resolved  LOS (Days) 12 A FACE TO FACE EVALUATION WAS PERFORMED  Nyoka Cowden 11/03/2012 9:01 AM

## 2012-11-04 ENCOUNTER — Inpatient Hospital Stay (HOSPITAL_COMMUNITY): Payer: BC Managed Care – PPO | Admitting: Speech Pathology

## 2012-11-04 ENCOUNTER — Inpatient Hospital Stay (HOSPITAL_COMMUNITY): Payer: BC Managed Care – PPO | Admitting: Physical Therapy

## 2012-11-04 ENCOUNTER — Inpatient Hospital Stay (HOSPITAL_COMMUNITY): Payer: BC Managed Care – PPO | Admitting: *Deleted

## 2012-11-04 LAB — GLUCOSE, CAPILLARY
Glucose-Capillary: 102 mg/dL — ABNORMAL HIGH (ref 70–99)
Glucose-Capillary: 90 mg/dL (ref 70–99)

## 2012-11-04 MED ORDER — TRAZODONE HCL 50 MG PO TABS
50.0000 mg | ORAL_TABLET | Freq: Every day | ORAL | Status: DC
  Administered 2012-11-04 – 2012-11-11 (×8): 50 mg via ORAL
  Filled 2012-11-04 (×8): qty 1

## 2012-11-04 MED ORDER — PREDNISONE 10 MG PO TABS
10.0000 mg | ORAL_TABLET | Freq: Two times a day (BID) | ORAL | Status: DC
  Administered 2012-11-04: 10 mg via ORAL
  Filled 2012-11-04 (×5): qty 1

## 2012-11-04 NOTE — Progress Notes (Signed)
Patient in dayroom sitting in wheelchair eating crushed oreo cookies after eating meal by NT, Ivin Booty. Instructed patient not to eat any food other than dysphasia I textured food . Patient reported he was "ok". RN instructed patient not to eat anything without full supervision . Patient verbalized understanding . Continue with plan of care .           Mliss Sax

## 2012-11-04 NOTE — Progress Notes (Signed)
Patient ID: [name], male   DOB: 1960/10/04, 52 y.o.   MRN: [id number] Subjective/Complaints: Didn't sleep well. Anxious at times. impulsive  A 12 point review of systems has been performed and if not noted above is otherwise negative.   Objective: Vital Signs: Blood pressure 139/69, pulse 60, temperature 97.2 F (36.2 C), temperature source Oral, resp. rate 18, weight 153.9 kg (339 lb 4.6 oz), SpO2 95.00%. No results found. No results found for this basename: WBC, HGB, HCT, PLT,  in the last 72 hours No results found for this basename: NA, K, CL, CO, GLUCOSE, BUN, CREATININE, CALCIUM,  in the last 72 hours CBG (last 3)   Recent Labs  11/03/12 1647 11/03/12 2046 11/04/12 0715  GLUCAP 161* 122* 102*    Wt Readings from Last 3 Encounters:  10/30/12 153.9 kg (339 lb 4.6 oz)  10/21/12 149.7 kg (330 lb 0.5 oz)  10/21/12 149.7 kg (330 lb 0.5 oz)    Physical Exam:  Constitutional: He is oriented to person, place, and time. He appears well-developed and well-nourished.  Morbidly obese HENT:  Head: Normocephalic and atraumatic.  Mouth/Throat: Abnormal dentition. Dental caries present. Oropharyngeal exudate present.  Neck: Normal range of motion.  Trach site sutured closed. Slight, yellow drainage from wound but decreased. Some redness around wound. Cardiovascular: Normal rate and regular rhythm.  Pulmonary/Chest: Effort normal.   no wheezes a few rhonchi. No distress.  Abdominal: Soft. Bowel sounds are normal. He exhibits no distension. There is no tenderness.  Musculoskeletal: He exhibits edema (1+ pedal edema.). Marland Kitchen Both feet/ankles warm, slightly red/tender to palpation Bilateral fore and mid foot as well as toes are pink  +pedal pulses felt.  Neurological: He is alert and oriented to person, place, and time.  Dysphonic,  . Right facial droop,lower facial and frontal droop, unable to close right eyelid. difficulty at times controlling secretions. Impulsive. Some improvement  insight and awareness.  Able to follow basic commands without difficulty. BUE with weakness and decrease in fine motor movements right greater than left. Strength grossly   3+ UE,  3, LE's. Good sitting balance Skin: Skin is warm and dry.   Stasis changes bilateral shins. Heels with dry skin.  Psych: more relaxed   Assessment/Plan: 1. 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 Physiatrist is providing close team supervision and 24 hour management of active medical problems listed below. Physiatrist and rehab team continue to assess barriers to discharge/monitor patient progress toward functional and medical goals.     FIM: FIM - Bathing Bathing Steps Patient Completed: Chest;Right Arm;Left Arm;Abdomen;Front perineal area;Right upper leg;Left upper leg Bathing: 3: Mod-Patient completes 5-7 20f 10 parts or 50-74%  FIM - Upper Body Dressing/Undressing Upper body dressing/undressing: 0: Wears gown/pajamas-no public clothing FIM - Lower Body Dressing/Undressing Lower body dressing/undressing: 0: Wears Interior and spatial designer  FIM - Toileting Toileting steps completed by patient: Adjust clothing prior to toileting Toileting Assistive Devices: Grab bar or rail for support Toileting: 3: Mod-Patient completed 2 of 3 steps  FIM - Radio producer Devices: Grab bars Toilet Transfers: 5-To toilet/BSC: Supervision (verbal cues/safety issues);5-From toilet/BSC: Supervision (verbal cues/safety issues)  FIM - Engineer, site Assistive Devices: Walker;HOB elevated;Bed rails Bed/Chair Transfer: 5: Supine > Sit: Supervision (verbal cues/safety issues);6: Sit > Supine: No assist;4: Bed > Chair or W/C: Min A (steadying  Pt. > 75%);5:  Chair or W/C > Bed: Supervision (verbal cues/safety issues)  FIM - Locomotion: Wheelchair Locomotion: Wheelchair: 5: Travels 150 ft or more: maneuvers on rugs and over door sills with supervision, cueing or coaxing FIM - Locomotion: Ambulation Locomotion: Ambulation Assistive Devices: Administrator Ambulation/Gait Assistance: 4: Min assist Locomotion: Ambulation: 2: Travels 50 - 149 ft with minimal assistance (Pt.>75%)  Comprehension Comprehension Mode: Auditory Comprehension: 5-Understands basic 90% of the time/requires cueing < 10% of the time  Expression Expression Mode: Verbal Expression Assistive Devices: 6-Other (Comment) Expression: 3-Expresses basic 50 - 74% of the time/requires cueing 25 - 50% of the time. Needs to repeat parts of sentences.  Social Interaction Social Interaction: 5-Interacts appropriately 90% of the time - Needs monitoring or encouragement for participation or interaction.  Problem Solving Problem Solving Mode: Not assessed Problem Solving: 4-Solves basic 75 - 89% of the time/requires cueing 10 - 24% of the time  Memory Memory Mode: Not assessed Memory: 4-Recognizes or recalls 75 - 89% of the time/requires cueing 10 - 24% of the time  Medical Problem List and Plan:  1. DVT Prophylaxis/Anticoagulation: Mechanical: Antiembolism stockings, knee (TED hose) Bilateral lower extremities  Sequential compression devices, below knee Bilateral lower extremities.  Add lovenox.  Will check dopplers.  2. Pain Management: N/A.   prn ultram.   -added prednisone for likely gout flare--wean 3. Mood: Provide ego support and redirection. LCSW to follow for evaluation.  4. Neuropsych: This patient is capable of making decisions on his/her own behalf.  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 Continue Lacrilube tid with patching at nights to prevent corneal abrasion. Eye patch at night. ?oculoplastics for gold pellet in eyelid 6. CAD: continue atenolol bid. Patient has  declined statin per reports.  7. DM type 2- diet controlled: Will monitor BS with AC/HS CBG checks. Use SSI for elevated BS. Currently 120-200 range likely due to stress and stress dose steroids. Fair control at present 8. Chronic LE edema: Low salt diet. Added TEDs. Needs to elevate feet. 9. Leukocytosis: improving  -ENT closed stoma at bedside  -ceftazidime---change back to cipro for a few more days then stop.  -secretions better  -wbc's decreased    8. HTN: Monitor with bid checks. Continue tenormin bid. Blood pressures have been variable and will adjust as needed.  9. Acute renal insufficiency: resolved  LOS (Days) 13 A FACE TO FACE EVALUATION WAS PERFORMED  SWARTZ,ZACHARY T 11/04/2012 8:21 AM

## 2012-11-04 NOTE — Consult Note (Deleted)
PULMONARY  / CRITICAL CARE MEDICINE  Name: [name] MRN: [id number] DOB: October 08, 1960    ADMISSION DATE:  10/22/2012 CONSULTATION DATE:  10/28/2012  REFERRING MD :  Dr. Benjamine Mola, ENT.  CHIEF COMPLAINT:  Purulent material from trach stoma.  BRIEF PATIENT DESCRIPTION: 52 year old with history of acoustic neuroma who underwent a resection that due to his size required that he undergo a tracheostomy.  Patient was decannulated after and has been in rehab.  While in rehab, stoma failed to heal properly and the patient continued to have purulent material exuding from the stoma.  Material grew serratia and patient was maintained on ceftaz with little improvement.  PCCM was consulted to evaluate if a bronchoscopy would be of help in control of secretion.       CULTURES: Sputum from 3/8 x2 with mixed flora.. 3-10 sputum>>nl flora  ANTIBIOTICS: Ceftaz start date unknown and dc date 3/17    SUBJECTIVE:  nad in w/c RA, ok cough mechanics when covers the trach stoma  VITAL SIGNS: Temp:  [97.2 F (36.2 C)-98.5 F (36.9 C)] 97.2 F (36.2 C) (03/17 0500) Pulse Rate:  [60-71] 60 (03/17 0500) Resp:  [18-19] 18 (03/17 0500) BP: (139-152)/(58-75) 140/58 mmHg (03/17 0939) SpO2:  [95 %] 95 % (03/17 0500)  PHYSICAL EXAMINATION: General:  Morbidly obese chronically ill appearing male in NAD. Neuro:  Alert and oriented, moving all ext to command. HEENT:  [place]/AT, PERRL, EOM-I and MMM. Neck:  Supple, unable to evaluate JVD and thyroid due to girth.  Stoma surgically closed with small opening and scant green drainage. Cardiovascular:  RRR, Nl S1/S2, -M/R/G. Lungs:  Coarse BS diffusely, distant and bibasilar crackles. Abdomen:  Soft, obese, NT, ND and +BS. Musculoskeletal:  -edema and -tenderness. Skin:  Intact.   Recent Labs Lab 10/29/12 0658 11/01/12 0700  NA 136 140  K 4.2 3.9  CL 97 98  CO2 31 37*  BUN 14 13  CREATININE 0.84 0.85  GLUCOSE 138* 118*    Recent Labs Lab  10/29/12 0658 11/01/12 0700  HGB 13.7 13.0  HCT 42.9 40.8  WBC 9.5 8.5  PLT 316 271   No results found.  ASSESSMENT / PLAN:  52 year old male s/p trach for a crani for acoustic neuroma.  Patient's stoma has not fully closed and there is increased amount of secretion in the sputum . rec Change dressing tid till no furher purulent drainage. F/u  ENT and wound closed but still leaking some  Nothing for PCCM to do , we will sign off 3/17.  Richardson Landry Minor ACNP Maryanna Shape PCCM Pager [phone] till 3 pm If no answer page [phone] 11/04/2012, 11:23 AM  Pt seen and examined and agree with above a/p  Christinia Gully, MD Pulmonary and [place] [phone] After 5:30 PM or weekends, call [phone]

## 2012-11-04 NOTE — Progress Notes (Signed)
Poor night sleep Saturday night, so requested PRN trazodone, 50mg  given at 2043 per patients request. Also 1 vicodin given at that time for complaint of right sided headache. Using [place] frequently to manage oral secretions. Unaware when pudding on right side of face. Cues to slow down when drinking liquids. Continues with spillage. Observed urine on floor. Patient reports, "I just had to go", although able to point at urinal within reach on bedside table. allevyn dressings to buttocks and one to left leg. 2 plus pitting edema to BLE's. Discolored LE's especially in dependent position. Dressing CD and I to old trach site. Sutures in place to trach stoma, with air leakage. At HS Refused peridex, senna s, oragel, lacrilube.[name] A

## 2012-11-04 NOTE — Progress Notes (Signed)
Occupational Therapy Session Note  Patient Details  Name: [name] MRN: [id number] Date of Birth: 1961-06-18  Today's Date: 11/04/2012 Time:  -   1000-1100  (60 min)    Short Term Goals: Week 1:  OT Short Term Goal 1 (Week 1): Pt will don shirt with supervision OT Short Term Goal 1 - Progress (Week 1): Met OT Short Term Goal 2 (Week 1): Pt will thread LB with min A OT Short Term Goal 2 - Progress (Week 1): Progressing toward goal OT Short Term Goal 3 (Week 1): Pt will transfer to toilet with min A with RW OT Short Term Goal 3 - Progress (Week 1): Met OT Short Term Goal 4 (Week 1): Pt will perform 3/3 toileting steps with max A OT Short Term Goal 4 - Progress (Week 1): Met Week 2:  OT Short Term Goal 1 (Week 2): Pt will thread pants during LB dressing with min A using AE PRN OT Short Term Goal 2 (Week 2): Pt will perform toileting tasks with steady A OT Short Term Goal 3 (Week 2): Pt will amb with RW into bathroom for toilet transfers with supervision OT Short Term Goal 4 (Week 2): Pt will complete bathing tasks with min A      Skilled Therapeutic Interventions/Progress Updates:    Engaged in therapeutic bathing and dressing at sink level.  Pt. Did not want to bathe stating he had already completed.  Pt. Put shaving cream on face.  OT shaved him for safety.  Pt. Combed hair after OT cut his hair.  Pt. Donned hospital gowns.  Refused clothes.  Pt. Did sit to half stand x4 during dressing.  Left pt with PT at end of session  Therapy Documentation Precautions:  Precautions Precautions: Fall Precaution Comments: dizziness with mobility, deaf R ear, blind R eye, double vision, open stoma Restrictions Weight Bearing Restrictions: No General:    Pain: left side of face  5/10          See FIM for current functional status  Therapy/Group: Individual Therapy  Lisa Roca 11/04/2012, 11:13 AM

## 2012-11-04 NOTE — Progress Notes (Signed)
Physical Therapy Session Note  Patient Details  Name: [name] MRN: [id number] Date of Birth: [birth year]  Today's Date: 11/04/2012 Time: 0730-0830 Time Calculation (min): 60 min  Short Term Goals: Week 2:  PT Short Term Goal 1 (Week 2): Pt will ambulate 32' with supervision and RW PT Short Term Goal 2 (Week 2): Pt will perform vestibular exercises with only supervision cues needed PT Short Term Goal 3 (Week 2): Pt will transfer wheelchair <> mat with modified independence  Session 1: Skilled Therapeutic Interventions/Progress Updates:    Pt modified independent with supine > sit with HOB elevated and with rails (pt uses recliner at home). Ambulation x 120', 60', 150' with RW and min-guard assist, cues for upright posture and controlled breathing. Pt has wide base of support and leans forwards on RW (likely secondary to long term use of rollator walker at home. Pt ascended/descended steps x 4 consecutively with  Bil. Railing and min assist. Discussed home d/c, pt desires to use rollator walker instead of wheelchair for longer distances. Will need to practice with this device prior to d/c and recommended family bring in pt's device as the unit does not have bariatric rollator walker.   Second Session Skilled Therapeutic Interventions/Progress Updates:  Time:  1100-1130 Time Calculation (min): 30 min Wheelchair propulsion x 150' with supervision and increased time for strengthening and endurance, cues to complete task without stopping. Pt attempted standing balance activities however was limited by bil. Foot/ankle pain. Pt able to ambulate x 150' with RW and min-guard assist and 2-3 standing rest breaks.   Pt on 2L [place] O2 throughout both session with SpO2 >93%.   Therapy Documentation Precautions:  Precautions Precautions: Fall Precaution Comments: dizziness with mobility, deaf R ear, blind R eye, double vision, open stoma Restrictions Weight Bearing Restrictions: No Pain: Pain  Assessment Pain Score:   8 both sessions Pain Type: Acute pain Pain Location: Ankle Pain Orientation: Left;Right Pain Descriptors: Aching Pain Onset: On-going Pain Intervention(s): Repositioned  See FIM for current functional status  Therapy/Group: Individual Therapy both sessions  Lahoma Rocker 11/04/2012, 11:43 AM

## 2012-11-04 NOTE — Progress Notes (Signed)
Speech Language Pathology Daily Session Note  Patient Details  Name: [name] MRN: [id number] Date of Birth: 12/01/1960  Today's Date: 11/04/2012 Time: 0900-0955 Time Calculation (min): 55 min  Short Term Goals: Week 2: SLP Short Term Goal 1 (Week 2): Pt will recall newly learned information with Mod A question and visual cues. SLP Short Term Goal 2 (Week 2): Pt will demonstrate selective attention to a functional task for 15 minutes with Mod A verbal cues for redirection SLP Short Term Goal 3 (Week 2): Pt will utilize swallowing compensatory startegies to minimize right buccal pocketing and anterior loss with Min A verbal and question cues.  SLP Short Term Goal 4 (Week 2): pt will apply pressure to his stoma to increase vocal intensity at the phrase level with Min A verbal and visual cues.   Skilled Therapeutic Interventions: Treatment focus on dysarthria and dysphagia goals. SLP facilitated session by utilizing NMES. Electrodes placed lateral to right buccinator muscle and slightly posterior and superior to initial electrode (4a position). He tolerated up to 9.5 mA during volitional oral-motor exercises for labial retraction and protrusion. Pt. required mod verbal and visual cues to apply pressure to stoma site to increase vocal intensity at the phrase level.    FIM:  Comprehension Comprehension Mode: Auditory Comprehension: 5-Understands basic 90% of the time/requires cueing < 10% of the time Expression Expression Mode: Verbal Expression: 3-Expresses basic 50 - 74% of the time/requires cueing 25 - 50% of the time. Needs to repeat parts of sentences. Social Interaction Social Interaction: 4-Interacts appropriately 75 - 89% of the time - Needs redirection for appropriate language or to initiate interaction. Problem Solving Problem Solving: 4-Solves basic 75 - 89% of the time/requires cueing 10 - 24% of the time Memory Memory: 4-Recognizes or recalls 75 - 89% of the  time/requires cueing 10 - 24% of the time FIM - Eating Eating Activity: 5: Supervision/cues  Pain No/Denies Pain  Therapy/Group: Individual Therapy  Hafiz Irion 11/04/2012, 12:57 PM

## 2012-11-04 NOTE — Progress Notes (Signed)
PULMONARY  / CRITICAL CARE MEDICINE  Name: [name] MRN: [id number] DOB: Jun 13, 1961    ADMISSION DATE:  10/22/2012 CONSULTATION DATE:  10/28/2012  REFERRING MD :  Dr. Benjamine Mola, ENT.  CHIEF COMPLAINT:  Purulent material from trach stoma.  BRIEF PATIENT DESCRIPTION: 52 year old with history of acoustic neuroma who underwent a resection that due to his size required that he undergo a tracheostomy.  Patient was decannulated after and has been in rehab.  While in rehab, stoma failed to heal properly and the patient continued to have purulent material exuding from the stoma.  Material grew serratia and patient was maintained on ceftaz with little improvement.  PCCM was consulted to evaluate if a bronchoscopy would be of help in control of secretion.       CULTURES: Sputum from 3/8 x2 with mixed flora.. 3-10 sputum>>nl flora  ANTIBIOTICS: Ceftaz start date unknown and dc date 3/17    SUBJECTIVE:  nad in w/c RA, ok cough mechanics when covers the trach stoma  VITAL SIGNS: Temp:  [97.2 F (36.2 C)-98.5 F (36.9 C)] 97.2 F (36.2 C) (03/17 0500) Pulse Rate:  [60-71] 60 (03/17 0500) Resp:  [18-19] 18 (03/17 0500) BP: (139-152)/(58-75) 140/58 mmHg (03/17 0939) SpO2:  [95 %] 95 % (03/17 0500)  PHYSICAL EXAMINATION: General:  Morbidly obese chronically ill appearing male in NAD. Neuro:  Alert and oriented, moving all ext to command. HEENT:  [place]/AT, PERRL, EOM-I and MMM. Neck:  Supple, unable to evaluate JVD and thyroid due to girth.  Stoma surgically closed with small opening and scant green drainage. Cardiovascular:  RRR, Nl S1/S2, -M/R/G. Lungs:  Coarse BS diffusely, distant and bibasilar crackles. Abdomen:  Soft, obese, NT, ND and +BS. Musculoskeletal:  -edema and -tenderness. Skin:  Intact.   Recent Labs Lab 10/29/12 0658 11/01/12 0700  NA 136 140  K 4.2 3.9  CL 97 98  CO2 31 37*  BUN 14 13  CREATININE 0.84 0.85  GLUCOSE 138* 118*    Recent Labs Lab  10/29/12 0658 11/01/12 0700  HGB 13.7 13.0  HCT 42.9 40.8  WBC 9.5 8.5  PLT 316 271   No results found.  ASSESSMENT / PLAN:  52 year old male s/p trach for a crani for acoustic neuroma.  Patient's stoma has not fully closed and there is increased amount of secretion in the sputum . rec Change dressing tid till no furher purulent drainage. F/u  ENT and wound closed but still leaking some  Nothing for PCCM to do , we will sign off 3/17.  Richardson Landry Minor ACNP [facility] Shape PCCM Pager [phone] till 3 pm If no answer page [phone] 11/04/2012, 1:18 PM  Pt seen and examined and agree with above a/p  Christinia Gully, MD Pulmonary and [place] [phone] After 5:30 PM or weekends, call [phone]

## 2012-11-05 ENCOUNTER — Inpatient Hospital Stay (HOSPITAL_COMMUNITY): Payer: BC Managed Care – PPO | Admitting: Occupational Therapy

## 2012-11-05 ENCOUNTER — Inpatient Hospital Stay (HOSPITAL_COMMUNITY): Payer: BC Managed Care – PPO | Admitting: Speech Pathology

## 2012-11-05 ENCOUNTER — Inpatient Hospital Stay (HOSPITAL_COMMUNITY): Payer: BC Managed Care – PPO | Admitting: Physical Therapy

## 2012-11-05 ENCOUNTER — Inpatient Hospital Stay (HOSPITAL_COMMUNITY): Payer: BC Managed Care – PPO

## 2012-11-05 LAB — GLUCOSE, CAPILLARY
Glucose-Capillary: 144 mg/dL — ABNORMAL HIGH (ref 70–99)
Glucose-Capillary: 173 mg/dL — ABNORMAL HIGH (ref 70–99)

## 2012-11-05 MED ORDER — OXYCODONE HCL 5 MG PO TABS
5.0000 mg | ORAL_TABLET | Freq: Four times a day (QID) | ORAL | Status: DC | PRN
  Administered 2012-11-05 – 2012-11-06 (×2): 5 mg via ORAL
  Filled 2012-11-05: qty 2
  Filled 2012-11-05: qty 1

## 2012-11-05 MED ORDER — COLCHICINE 0.6 MG PO TABS
0.6000 mg | ORAL_TABLET | Freq: Two times a day (BID) | ORAL | Status: DC
  Administered 2012-11-05 – 2012-11-11 (×14): 0.6 mg via ORAL
  Filled 2012-11-05 (×17): qty 1

## 2012-11-05 MED ORDER — PREDNISONE 20 MG PO TABS
20.0000 mg | ORAL_TABLET | Freq: Three times a day (TID) | ORAL | Status: DC
  Administered 2012-11-05 – 2012-11-10 (×14): 20 mg via ORAL
  Filled 2012-11-05 (×18): qty 1

## 2012-11-05 NOTE — Progress Notes (Signed)
Physical Therapy Note  Patient Details  Name: [name] MRN: [id number] Date of Birth: 1960-09-03 Today's Date: 11/05/2012  Time: 1300-1400 60 minutes  1:1 Pt c/o headache, RN aware.  Gait training with RW in controlled environment with close supervision, cues for posture and safety.  Gait with obstacle negotiation with min A, mod cuing for safety and sequencing, little carry over noted throughout session.  Stair training 3 stairs with B handrails multiple attempts with min A, cues for safety.  Seated therex for LE strength and conditioning.  Pt required cues for following directions and attention to task throughout session.   Linnea Todisco 11/05/2012, 3:21 PM

## 2012-11-05 NOTE — Progress Notes (Signed)
Patient ID: [name], male   DOB: 1961/01/07, 52 y.o.   MRN: [id number] Subjective/Complaints: Right foot giving him problems (feel like I am going backwards). Secretion and leakage from stoma A 12 point review of systems has been performed and if not noted above is otherwise negative.   Objective: Vital Signs: Blood pressure 140/80, pulse 69, temperature 98.4 F (36.9 C), temperature source Oral, resp. rate 18, weight 153.9 kg (339 lb 4.6 oz), SpO2 92.00%. No results found. No results found for this basename: WBC, HGB, HCT, PLT,  in the last 72 hours No results found for this basename: NA, K, CL, CO, GLUCOSE, BUN, CREATININE, CALCIUM,  in the last 72 hours CBG (last 3)   Recent Labs  11/04/12 1648 11/04/12 2053 11/05/12 0729  GLUCAP 95 125* 88    Wt Readings from Last 3 Encounters:  10/30/12 153.9 kg (339 lb 4.6 oz)  10/21/12 149.7 kg (330 lb 0.5 oz)  10/21/12 149.7 kg (330 lb 0.5 oz)    Physical Exam:  Constitutional: He is oriented to person, place, and time. He appears well-developed and well-nourished.  Morbidly obese HENT:  Head: Normocephalic and atraumatic.  Mouth/Throat: Abnormal dentition. Dental caries present. Oropharyngeal exudate present.  Neck: Normal range of motion.  Trach site sutured closed. Persistent drainage. Some redness around wound. Cardiovascular: Normal rate and regular rhythm.  Pulmonary/Chest: Effort normal.   no wheezes a few rhonchi. No distress.  Abdominal: Soft. Bowel sounds are normal. He exhibits no distension. There is no tenderness.  Musculoskeletal: He exhibits edema (1+ pedal edema.). Marland Kitchen Both feet/ankles warm, slightly red/tender to palpation, right still tender to palpation Bilateral fore and mid foot as well as toes are pink  +pedal pulses felt.  Neurological: He is alert and oriented to person, place, and time.  Dysphonic,  . Right facial droop,lower facial and frontal droop, unable to close right eyelid. difficulty at times  controlling secretions. Impulsive. Some improvement insight and awareness.  Able to follow basic commands without difficulty. BUE with weakness and decrease in fine motor movements right greater than left. Strength grossly   3+ UE,  3, LE's. Good sitting balance Skin: Skin is warm and dry.   Stasis changes bilateral shins. Heels with dry skin.  Psych: more relaxed   Assessment/Plan: 1. 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 Physiatrist is providing close team supervision and 24 hour management of active medical problems listed below. Physiatrist and rehab team continue to assess barriers to discharge/monitor patient progress toward functional and medical goals.     FIM: FIM - Bathing Bathing Steps Patient Completed: Chest;Right Arm;Left Arm;Abdomen;Front perineal area;Right upper leg;Left upper leg Bathing: 0: Activity did not occur  FIM - Upper Body Dressing/Undressing Upper body dressing/undressing: 0: Wears gown/pajamas-no public clothing FIM - Lower Body Dressing/Undressing Lower body dressing/undressing: 0: Wears gown/pajamas-no public clothing  FIM - Toileting Toileting steps completed by patient: Adjust clothing prior to toileting Toileting Assistive Devices: Grab bar or rail for support Toileting: 3: Mod-Patient completed 2 of 3 steps  FIM - Radio producer Devices: Grab bars Toilet Transfers: 5-To toilet/BSC: Supervision (verbal cues/safety issues);5-From toilet/BSC: Supervision (verbal cues/safety issues)  FIM - Engineer, site Assistive Devices: Walker;HOB elevated;Bed rails Bed/Chair Transfer: 6: Supine > Sit: No assist;5: Bed > Chair or W/C: Supervision (verbal cues/safety issues);5: Chair or  W/C > Bed:  Supervision (verbal cues/safety issues)  FIM - Locomotion: Wheelchair Locomotion: Wheelchair: 5: Travels 150 ft or more: maneuvers on rugs and over door sills with supervision, cueing or coaxing FIM - Locomotion: Ambulation Locomotion: Ambulation Assistive Devices: Administrator Ambulation/Gait Assistance: 4: Min guard Locomotion: Ambulation: 4: Travels 150 ft or more with minimal assistance (Pt.>75%)  Comprehension Comprehension Mode: Auditory (HOH to right ear) Comprehension: 5-Understands basic 90% of the time/requires cueing < 10% of the time  Expression Expression Mode: Verbal Expression Assistive Devices: 6-Other (Comment) Expression: 4-Expresses basic 75 - 89% of the time/requires cueing 10 - 24% of the time. Needs helper to occlude trach/needs to repeat words.  Social Interaction Social Interaction: 4-Interacts appropriately 75 - 89% of the time - Needs redirection for appropriate language or to initiate interaction.  Problem Solving Problem Solving Mode: Not assessed Problem Solving: 4-Solves basic 75 - 89% of the time/requires cueing 10 - 24% of the time  Memory Memory Mode: Not assessed Memory: 4-Recognizes or recalls 75 - 89% of the time/requires cueing 10 - 24% of the time  Medical Problem List and Plan:  1. DVT Prophylaxis/Anticoagulation: Mechanical: Antiembolism stockings, knee (TED hose) Bilateral lower extremities  Sequential compression devices, below knee Bilateral lower extremities.  Add lovenox.  Will check dopplers.  2. Pain Management: N/A.   prn ultram.   -added prednisone for likely gout flare--left foot better. Right foot still painful.  -increase prednisone. Add colchicine 3. Mood: Provide ego support and redirection. LCSW to follow for evaluation.  4. Neuropsych: This patient is capable of making decisions on his/her own behalf.  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 Continue Lacrilube tid with patching at nights to prevent corneal abrasion. Eye  patch at night. ?oculoplastics for gold pellet in eyelid 6. CAD: continue atenolol bid. Patient has declined statin per reports.  7. DM type 2- diet controlled: Will monitor BS with AC/HS CBG checks. Use SSI for elevated BS. Currently 120-200 range likely due to stress and stress dose steroids. Fair control at present 8. Chronic LE edema: Low salt diet. Added TEDs. Needs to elevate feet. 9. Leukocytosis: improving  -ENT closed stoma at bedside  -ceftazidime---change back to cipro for a few more days then stop.  -secretions can come and go. Stoma sutured but not closed. Has to occlude with hand when speaking  -wbc's decreased    8. HTN: Monitor with bid checks. Continue tenormin bid. Blood pressures have been variable and will adjust as needed.  9. Acute renal insufficiency: resolved  LOS (Days) 14 A FACE TO FACE EVALUATION WAS PERFORMED  Shaunn Tackitt T 11/05/2012 8:58 AM

## 2012-11-05 NOTE — Progress Notes (Signed)
Occupational Therapy Session Note  Patient Details  Name: [name] MRN: [id number] Date of Birth: 1961/06/30  Today's Date: 11/05/2012 Time: 1000-1100 Time Calculation (min): 60 min  Short Term Goals: Week 2:  OT Short Term Goal 1 (Week 2): Pt will thread pants during LB dressing with min A using AE PRN OT Short Term Goal 2 (Week 2): Pt will perform toileting tasks with steady A OT Short Term Goal 3 (Week 2): Pt will amb with RW into bathroom for toilet transfers with supervision OT Short Term Goal 4 (Week 2): Pt will complete bathing tasks with min A  Skilled Therapeutic Interventions/Progress Updates:    Pt seated EOB with RN and PA at bedside.  Pt hesitant to participate in therapies stating that "I;ve gone backwards."  Pt encouraged to complete bathing and dressing tasks sit<>stand from EOB.  Pt agreeable and completed all tasks with assistance only for bathing perineal area and feet.  O2 sats>90% on 2L O2 throughout session.  Kinesio tape applied to right side of cervical vertebrae and right traps to alleviate muscle tightness in area.  Pt required encouragement and emotional support throughout session.  Therapy Documentation Precautions:  Precautions Precautions: Fall Precaution Comments: dizziness with mobility, deaf R ear, blind R eye, double vision, open stoma Restrictions Weight Bearing Restrictions: No Pain: Pain Assessment Pain Assessment: 0-10 Pain Score:   6 Pain Type: Acute pain Pain Location: Head Pain Orientation: Right;Anterior;Posterior Pain Descriptors: Headache Pain Frequency: Occasional Pain Onset: Gradual Patients Stated Pain Goal: 2 Pain Intervention(s): RN made aware  See FIM for current functional status  Therapy/Group: Individual Therapy  Leroy Libman 11/05/2012, 11:06 AM

## 2012-11-05 NOTE — Progress Notes (Signed)
Occupational Therapy Session Note  Patient Details  Name: KONSTANTY SEIDNER MRN: [id number] Date of Birth: Oct 12, 1960  Today's Date: 11/05/2012 Time: [id number] Time Calculation (min): 25 min  Skilled Therapeutic Interventions/Progress Updates:    Pt initially began using UE ergonometer for strengthening and endurance.  Pt able to perform for 3 mins on level 13 resistance.  At conclusion of 3 mins pt reported increased headache pain and stated that he couldn't do anymore.  He perseverated on talking about the new pain medicine that he just received and how it was not helping and not making him feel very good.  Pt requested to go back to bed.  Performed stand pivot transfer back to bed with mod assist and therapist standing in front, to make him feel safer.  Pt voiced concern of falling since he was weaker this pm than normal.  Nursing made aware of situation.  Therapy Documentation Precautions:  Precautions Precautions: Fall Precaution Comments: dizziness with mobility, deaf R ear, blind R eye, double vision, open stoma Restrictions Weight Bearing Restrictions: No  Pain: Pain Assessment Pain Assessment: No/denies pain Pain Score:   7 Pain Type: Acute pain Pain Location: Head Pain Orientation: Right Pain Descriptors: Headache Pain Onset: Gradual Pain Intervention(s): Medication (See eMAR) ADL: See FIM for current functional status  Therapy/Group: Individual Therapy  Nickolaos Brallier 11/05/2012, 4:22 PM

## 2012-11-05 NOTE — Progress Notes (Signed)
Speech Language Pathology Daily Session Note  Patient Details  Name: [name] MRN: [id number] Date of Birth: 1961/03/13  Today's Date: 11/05/2012 Time: [id number] Time Calculation (min): 45 min  Short Term Goals: Week 2: SLP Short Term Goal 1 (Week 2): Pt will recall newly learned information with Mod A question and visual cues. SLP Short Term Goal 2 (Week 2): Pt will demonstrate selective attention to a functional task for 15 minutes with Mod A verbal cues for redirection SLP Short Term Goal 3 (Week 2): Pt will utilize swallowing compensatory startegies to minimize right buccal pocketing and anterior loss with Min A verbal and question cues.  SLP Short Term Goal 4 (Week 2): pt will apply pressure to his stoma to increase vocal intensity at the phrase level with Min A verbal and visual cues.   Skilled Therapeutic Interventions: Treatment focus on speech and dysphagia goals. Pt consumed Dys. 1 textures and thin liquids without overt s/s of aspiration but required Min A verbal cues for utilization of small bites and placement of bolus on left side of oral cavity. Pt also required Mod A for self-monitoring and correcting right anterior spillage. Pt performed labial oral-motor exercises with Mod A verbal and visual cues. Pt also required Min A verbal and visual cues to apply pressure to stoma during functional conversation to increase vocal intensity and overall speech intelligibility to 90%.    FIM:  Comprehension Comprehension Mode: Auditory Comprehension: 5-Understands basic 90% of the time/requires cueing < 10% of the time Expression Expression Mode: Verbal Expression: 4-Expresses basic 75 - 89% of the time/requires cueing 10 - 24% of the time. Needs helper to occlude trach/needs to repeat words. Social Interaction Social Interaction: 4-Interacts appropriately 75 - 89% of the time - Needs redirection for appropriate language or to initiate interaction. Problem Solving Problem  Solving: 4-Solves basic 75 - 89% of the time/requires cueing 10 - 24% of the time Memory Memory: 4-Recognizes or recalls 75 - 89% of the time/requires cueing 10 - 24% of the time FIM - Eating Eating Activity: 5: Supervision/cues  Pain No/Denies Pain   Therapy/Group: Individual Therapy  Eugenie Harewood 11/05/2012, 9:30 AM

## 2012-11-05 NOTE — Progress Notes (Signed)
Patient  Complained of numbness in left arm and left leg . Blood pressure 140/80 . Pulse 82. Patient reports increasing headache . vicodin 1 po given at 0850. P.Love ,[place] notifed . Continue with plan of care .  Mliss Sax

## 2012-11-06 ENCOUNTER — Inpatient Hospital Stay (HOSPITAL_COMMUNITY): Payer: BC Managed Care – PPO | Admitting: Speech Pathology

## 2012-11-06 ENCOUNTER — Inpatient Hospital Stay (HOSPITAL_COMMUNITY): Payer: BC Managed Care – PPO

## 2012-11-06 ENCOUNTER — Inpatient Hospital Stay (HOSPITAL_COMMUNITY): Payer: BC Managed Care – PPO | Admitting: Physical Therapy

## 2012-11-06 DIAGNOSIS — D333 Benign neoplasm of cranial nerves: Secondary | ICD-10-CM

## 2012-11-06 DIAGNOSIS — R269 Unspecified abnormalities of gait and mobility: Secondary | ICD-10-CM

## 2012-11-06 DIAGNOSIS — J209 Acute bronchitis, unspecified: Secondary | ICD-10-CM

## 2012-11-06 DIAGNOSIS — G519 Disorder of facial nerve, unspecified: Secondary | ICD-10-CM

## 2012-11-06 DIAGNOSIS — R5381 Other malaise: Secondary | ICD-10-CM

## 2012-11-06 DIAGNOSIS — E669 Obesity, unspecified: Secondary | ICD-10-CM

## 2012-11-06 LAB — URIC ACID: Uric Acid, Serum: 4.9 mg/dL (ref 4.0–7.8)

## 2012-11-06 LAB — CBC
Hemoglobin: 12.5 g/dL — ABNORMAL LOW (ref 13.0–17.0)
MCHC: 32.2 g/dL (ref 30.0–36.0)
RBC: 4.12 MIL/uL — ABNORMAL LOW (ref 4.22–5.81)

## 2012-11-06 LAB — GLUCOSE, CAPILLARY: Glucose-Capillary: 143 mg/dL — ABNORMAL HIGH (ref 70–99)

## 2012-11-06 LAB — BASIC METABOLIC PANEL
Chloride: 97 mEq/L (ref 96–112)
Creatinine, Ser: 0.67 mg/dL (ref 0.50–1.35)
GFR calc Af Amer: >90 mL/min (ref >90)
GFR calc non Af Amer: >90 mL/min (ref >90)
Potassium: 4.3 mEq/L (ref 3.5–5.1)

## 2012-11-06 MED ORDER — HYDROCODONE-ACETAMINOPHEN 10-325 MG PO TABS
1.0000 | ORAL_TABLET | ORAL | Status: DC | PRN
  Administered 2012-11-07 – 2012-11-11 (×14): 1 via ORAL
  Filled 2012-11-06 (×14): qty 1

## 2012-11-06 NOTE — Progress Notes (Signed)
Subjective: Pt awake and alert today.  Voice is strong.  No new c/o.  Objective: Vital signs in last 24 hours: Temp:  [97.7 F (36.5 C)-99 F (37.2 C)] 97.7 F (36.5 C) (03/19 0619) Pulse Rate:  [72-77] 77 (03/19 0619) Resp:  [16-18] 18 (03/19 0619) BP: (139-150)/(66-87) 150/87 mmHg (03/19 0619) SpO2:  [94 %-98 %] 98 % (03/19 0619) Weight:  [162.47 kg (358 lb 2.9 oz)] 162.47 kg (358 lb 2.9 oz) (03/19 0619)  Awake and responsive.  Able to communicate verbally and via gestures.  Right facial palsy noted. Incomplete eye closure.  Trach incision closed. No significant erythema or edema. Slight drainage is noted.   Recent Labs  11/06/12 0559  WBC 9.1  HGB 12.5*  HCT 38.8*  PLT 215    Recent Labs  11/06/12 0559  NA 135  K 4.3  CL 97  CO2 33*  GLUCOSE 101*  BUN 14  CREATININE 0.67  CALCIUM 9.3    Medications:  I have reviewed the patient's current medications. Scheduled: . albuterol  2.5 mg Nebulization BID  . artificial tears   Right Eye Q8H  . atenolol  50 mg Oral BID  . benzocaine   Mouth/Throat TID PC & HS  . chlorhexidine  15 mL Mouth/Throat QID  . colchicine  0.6 mg Oral BID  . enoxaparin (LOVENOX) injection  40 mg Subcutaneous Q24H  . feeding supplement  1 Container Oral TID BM  . feeding supplement  30 mL Oral TID WC  . glycopyrrolate  1 mg Oral BID  . guaiFENesin  1,200 mg Oral BID  . hydrocerin   Topical BID  . insulin aspart  0-15 Units Subcutaneous TID WC  . insulin aspart  0-5 Units Subcutaneous QHS  . insulin aspart  3 Units Subcutaneous TID WC  . ipratropium  0.5 mg Nebulization BID  . multivitamin with minerals  1 tablet Oral Daily  . neomycin-bacitracin-polymyxin   Topical TID  . predniSONE  20 mg Oral TID WC  . saccharomyces boulardii  250 mg Oral BID  . senna-docusate  2 tablet Oral BID  . tamsulosin  0.4 mg Oral QPC supper  . traZODone  50 mg Oral QHS    Assessment/Plan: 1. s/p right AN resection. Postop CT shows good tumor  resection and decompression.  2. Right facial palsy noted. Will need lacrilube to prevent corneal drying. Will get oculoplastic consult as an outpatient.  3. Stoma closed. Slight drainage is noted. Change dressing prn. Will remove sutures next week.    LOS: 15 days   Alexandra Posadas,SUI W 11/06/2012, 12:51 PM

## 2012-11-06 NOTE — Progress Notes (Signed)
Physical Therapy Session Note  Patient Details  Name: [name] MRN: [id number] Date of Birth: 04-05-1961  Today's Date: 11/06/2012 Time: [id number] Time Calculation (min): 50 min  Short Term Goals: Week 2:  PT Short Term Goal 1 (Week 2): Pt will ambulate 29' with supervision and RW PT Short Term Goal 2 (Week 2): Pt will perform vestibular exercises with only supervision cues needed PT Short Term Goal 3 (Week 2): Pt will transfer wheelchair <> mat with modified independence  Skilled Therapeutic Interventions/Progress Updates:    Stand pivot transfer bed to w/c with mod@.  Sit to stand with min@.  Gait training with RW 61' and 77' with min@.  Pt with a wide BOS, hit his left foot several times on the side of the RW.  Side to side sway throughout the gait cycle.  Nustep with work load on 10 x 3 min x 2, before pt too fatigued to continue.  Returned to the room early due to pt needing to use restroom, but then he changed his mind as the feeling had passed, but was afraid to leave the room and continue in case the feeling came back.  Pt reporting that he felt dizzy throughout the session.  Therapy Documentation Precautions:  Precautions Precautions: Fall Precaution Comments: dizziness with mobility, deaf R ear, blind R eye, double vision, open stoma Restrictions Weight Bearing Restrictions: No Pain: Pain Assessment Pain Assessment: 0-10 Pain Score:   7 Pain Type: Acute pain Pain Location: Head Pain Orientation: Right Pain Descriptors: Headache Pain Onset: Gradual Patients Stated Pain Goal: 2 Pain Intervention(s): Medication (See eMAR)  See FIM for current functional status  Therapy/Group: Individual Therapy  Waylan Boga 11/06/2012, 11:21 AM

## 2012-11-06 NOTE — Patient Care Conference (Signed)
Inpatient RehabilitationTeam Conference and Plan of Care Update Date: 11/05/2012   Time: 3:00 PM    Patient Name: [name]      Medical Record Number: [id number]  Date of Birth: 02/28/1961 Sex: Male         Room/Bed: 4027/4027-01 Payor Info: Payor: BLUE CROSS BLUE [employer]IELD  Plan: BCBS PPO OUT OF STATE  Product Type: *No Product type*     Admitting Diagnosis: acoustic neuroma resection  Admit Date/Time:  10/22/2012  7:35 PM Admission Comments: No comment available   Primary Diagnosis:  Acoustic neuroma Principal Problem: Acoustic neuroma  Patient Active Problem List   Diagnosis Date Noted  . Purulent bronchitis 10/28/2012  . Serratia infection 10/24/2012  . 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 Leucocytosis 10/24/2012  . Impaired fasting glucose 10/24/2012  . Tracheostomy status 10/17/2012  . Acoustic neuroma 10/12/2012  . Status post craniotomy 10/12/2012  . Respiratory failure, post-operative 10/12/2012  . CAD (coronary artery disease) of artery bypass graft 09/09/2012  . 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 Preop cardiovascular exam 09/09/2012  . Morbid obesity 09/09/2012  . Tobacco abuse 09/09/2012    Expected Discharge Date: Expected Discharge Date: 11/12/12  Team Members Present: Physician leading conference: Dr. Alger Simons Social Worker Present: Lennart Pall, LCSW Nurse Present: Other (comment) (Melissa Ramgeet, RN) PT Present: Isabelle Course, PT OT Present: Willeen Cass, Starling Manns, [place];Roanna Epley, COTA SLP Present: Weston Anna, SLP Other (Discipline and Name): Daiva Nakayama, PPS Coordinator     Current Status/Progress Goal Weekly Team Focus  Medical   stoma still closing. various pain complaints, anxiety, insight? gout rx  pain and anxiety mgt  secretion, pain mgt, gout rx   Bowel/Bladder   pt cont of B/B.  Mod Indep  will continue to be cont of B/B.    Swallow/Nutrition/ Hydration   Dys. 1 textures with thin liquids, Min A  for utilization of swallowing compensatory strategies  Supervision  increased utilization of swallowing compensatory strategies, trials of Dys. 2 textures   ADL's   supervision/min A overall-pt conitnues to decline donning clothing; pt verbalizes that he feels like he is "going backwards."  mod I/supervision overall  activity tolerance, family education   Mobility   supervision/min-guard assist now ambulating ~150' with rolling walker.   supervision  Dynamic/static balance, endurance, activity tolerance and safety with mobility.    Communication   Mod A  Supervision  utilizaiton of speech intelligibility strategies and use of applying pressure to stoma to increase vocal intensity   Safety/Cognition/ Behavioral Observations  ModA  Min A  working memory   Pain   pt c/o right side head pain. One vicodin 5-325mg  po q 4 hrs prn, one ultram 50mg  po q 6hrs for pain and tylenol 650mg  po as needed.    less than or equal to 3.  will continue to monitor and medicate as needed.    Skin   buttocks excoriated bilaterally allevyn dressings intact, microguard to bilateral groin for redness bilateral feet dry heels cracked eucerin cream applied. Neosporin, drawtex and 4x4 applied to old stoma site insicion sutures CDI.   will have no new skin breakdown/infection.  will continue to monitor and assess skin q shift    Rehab Goals Patient on target to meet rehab goals: Yes *See Interdisciplinary Assessment and Plan and progress notes for long and short-term goals  Barriers to Discharge: pain, anxiety, insight    Possible Resolutions to Barriers:  pain mgt, supervision,  Discharge Planning/Teaching Needs:  Home with family to provide 24/7 assistance/ supervision      Team Discussion:  Still with poor awareness and insight and limited gains with vital-stim.  Trach site slowly closing up. Making gains overall with physical activity and endurance.  Need to schedule family education.  Revisions to  Treatment Plan:  No changes at this point.     Continued Need for Acute Rehabilitation Level of Care: The patient requires daily medical management by a physician with specialized training in physical medicine and rehabilitation for the following conditions: Daily direction of a multidisciplinary physical rehabilitation program to ensure safe treatment while eliciting the highest outcome that is of practical value to the patient.: Yes Daily medical management of patient stability for increased activity during participation in an intensive rehabilitation regime.: Yes Daily analysis of laboratory values and/or radiology reports with any subsequent need for medication adjustment of medical intervention for : Neurological problems;Other;Post surgical problems;Pulmonary problems  Cheron Pasquarelli 11/06/2012, 3:03 PM

## 2012-11-06 NOTE — Progress Notes (Signed)
NUTRITION FOLLOW UP  DOCUMENTATION CODES  Per approved criteria   -Morbid Obesity    Intervention:   1. Continue Ensure Pudding, Prostat, and Magic Cups 2. Diet rec's per SLP 3. RD to continue to follow nutrition care plan  Nutrition Dx:   Increased nutrient needs related to wound healing as evidenced by estimated needs. Ongoing.  Goal:   Intake to meet >90% of estimated nutrition needs. Improving.  Monitor:   weight trends, lab trends, I/O's, PO intake, supplement tolerance  Assessment:   Admitted with complains of R-sided hearing loss x 1 year and 16-month history of difficulty using hands, hoarseness, urinary frequency, and dizziness with balance problems requiring use of wheelchair. 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 He was admitted on 2/21 for acoustic neroma resection. Post-op required slow vent wean but tolerated ATC and was de-cannulated on 3/03. Oral secretions have decreased and FEES done revealed delay in swallow with ?aspiration/penetration with straws. Was started on D3, thin liquids. He continues to have right facial palsy, dizziness with activity and inability to phonate. Pt was followed by RD staff during acute hospitalization. Pt was maintained on enteral nutrition during vent and weaning process.   Remains on Dysphagia 1 diet. Meal intake continues to improve, pt consuming 100%. Continues with order for 30 ml Prostat and Ensure Pudding. RN confirms patient will take supplements as scheduled.  PCCM has signed off, noting patient's stoma has not fully closed and there is increased amount of secretion in the sputum. Recommendations to change dressing TID till no further purulent drainage.  Height: Ht Readings from Last 1 Encounters:  10/12/12 5\' 11"  (1.803 m)    Weight Status:   Wt Readings from Last 1 Encounters:  11/06/12 358 lb 2.9 oz (162.47 kg)  Wt trending up; noted +4 liters daily.  Re-estimated needs:  Kcal: 2300 -  2500 Protein: 115 - 140 grams Fluid: 2.3 - 2.5 liters  Skin: DTI to L and R buttocks  Diet Order: Dysphagia 1; thin liquids   Intake/Output Summary (Last 24 hours) at 11/06/12 1033 Last data filed at 11/06/12 0800  Gross per 24 hour  Intake    960 ml  Output   2000 ml  Net  -1040 ml    Last BM: 3/16   Labs:   Recent Labs Lab 11/01/12 0700 11/06/12 0559  NA 140 135  K 3.9 4.3  CL 98 97  CO2 37* 33*  BUN 13 14  CREATININE 0.85 0.67  CALCIUM 9.4 9.3  GLUCOSE 118* 101*    CBG (last 3)   Recent Labs  11/05/12 1154 11/05/12 1726 11/05/12 2053  GLUCAP 101* 144* 173*    Scheduled Meds: . albuterol  2.5 mg Nebulization BID  . artificial tears   Right Eye Q8H  . atenolol  50 mg Oral BID  . benzocaine   Mouth/Throat TID PC & HS  . chlorhexidine  15 mL Mouth/Throat QID  . colchicine  0.6 mg Oral BID  . enoxaparin (LOVENOX) injection  40 mg Subcutaneous Q24H  . feeding supplement  1 Container Oral TID BM  . feeding supplement  30 mL Oral TID WC  . glycopyrrolate  1 mg Oral BID  . guaiFENesin  1,200 mg Oral BID  . hydrocerin   Topical BID  . insulin aspart  0-15 Units Subcutaneous TID WC  . insulin aspart  0-5 Units Subcutaneous QHS  . insulin aspart  3 Units Subcutaneous TID WC  . ipratropium  0.5 mg Nebulization BID  . multivitamin with minerals  1 tablet Oral Daily  . neomycin-bacitracin-polymyxin   Topical TID  . predniSONE  20 mg Oral TID WC  . saccharomyces boulardii  250 mg Oral BID  . senna-docusate  2 tablet Oral BID  . tamsulosin  0.4 mg Oral QPC supper  . traZODone  50 mg Oral QHS    Continuous Infusions:  none  Inda Coke MS, RD, LDN Pager: [phone] After-hours pager: [phone]

## 2012-11-06 NOTE — Progress Notes (Signed)
Occupational Therapy Weekly Progress Note  Patient Details  Name: [name] MRN: [id number] Date of Birth: 1961/08/02  Today's Date: 11/06/2012  Patient has met 4 of 4 short term goals.  Pt continues to progress slowly with BADLs and continues to require encouragement to participate in therapy.  Pt requires mod verbal cues for safety awareness when ambulating with verbal cues to scan to the right to locate objects and avoid obstacles.   Patient continues to demonstrate the following deficits: muscle weakness, decreased cardiorespiratoy endurance and decreased oxygen support, decreased awareness, decreased problem solving, decreased safety awareness, decreased memory and insight and decreased standing balance and decreased balance strategies and therefore will continue to benefit from skilled OT intervention to enhance overall performance with BADL.  Patient progressing toward long term goals..  Continue plan of care.  OT Short Term Goals Week 2:  OT Short Term Goal 1 (Week 2): Pt will thread pants during LB dressing with min A using AE PRN OT Short Term Goal 1 - Progress (Week 2): Met OT Short Term Goal 2 (Week 2): Pt will perform toileting tasks with steady A OT Short Term Goal 2 - Progress (Week 2): Met OT Short Term Goal 3 (Week 2): Pt will amb with RW into bathroom for toilet transfers with supervision OT Short Term Goal 3 - Progress (Week 2): Met OT Short Term Goal 4 (Week 2): Pt will complete bathing tasks with min A OT Short Term Goal 4 - Progress (Week 2): Met Week 3:  OT Short Term Goal 1 (Week 3): Pt will perform toileting tasks at mod I OT Short Term Goal 2 (Week 3): Pt will complete bathing with supervision using AE prn OT Short Term Goal 3 (Week 3): Pt will perform LB dressing with min A.  Skilled Therapeutic Interventions/Progress Updates:    continue with POC   Therapy Documentation Precautions:  Precautions Precautions: Fall Precaution Comments: dizziness with  mobility, deaf R ear, blind R eye, double vision, open stoma Restrictions Weight Bearing Restrictions: No  See FIM for current functional status  Therapy/Group: Individual Therapy  Leroy Libman 11/06/2012, 11:53 AM

## 2012-11-06 NOTE — Progress Notes (Signed)
Nursing Note: Received report form off-going nurse.wbb

## 2012-11-06 NOTE — Progress Notes (Signed)
Social Work Patient ID: Minerva Fester, male   DOB: 14-Mar-1961, 52 y.o.   MRN: [id number]  Met with patient and spoke with pt's wife (via phone) to review team conference.  Both aware team continues to plan toward 3/25 d/c date.  Wife and pt's mother are to be here Friday afternoon for family education.  Pt states he doesn't want to go home "...until this closes up (trach site)",  His primary concern being that "an infection will get in it".  MD aware pt has these concerns.  Will continue to plan toware 3/25 d/c.    Derhonda Eastlick, LCSW

## 2012-11-06 NOTE — Progress Notes (Signed)
Physical Therapy Note  Patient Details  Name: [name] MRN: [id number] Date of Birth: Oct 19, 1960 Today's Date: 11/06/2012  Time: 930-955 25 minutes  Pt c/o headache and chest pain, RN present and made aware.  Pt required max encouragement to participate due to c/o pain.  Pt finally agreed to deep breathing exercises and AROM/stretching exercises for B UEs and chest to relieve pain and tightness.  Pt required mod cues for attention to task throughout treatment and visual and verbal cues to follow exercises.  Pt distracted by pain throughout session.  Individual therapy   [name] 11/06/2012, 11:32 AM

## 2012-11-06 NOTE — Progress Notes (Signed)
Patient ID: [name], male   DOB: 1961/05/23, 52 y.o.   MRN: [id number] Subjective/Complaints: Reports right foot swelling. Pain seems better. Both feet dangling down upon my entering room.  A 12 point review of systems has been performed and if not noted above is otherwise negative.   Objective: Vital Signs: Blood pressure 150/87, pulse 77, temperature 97.7 F (36.5 C), temperature source Oral, resp. rate 18, weight 162.47 kg (358 lb 2.9 oz), SpO2 98.00%. No results found.  Recent Labs  11/06/12 0559  WBC 9.1  HGB 12.5*  HCT 38.8*  PLT 215    Recent Labs  11/06/12 0559  NA 135  K 4.3  CL 97  GLUCOSE 101*  BUN 14  CREATININE 0.67  CALCIUM 9.3   CBG (last 3)   Recent Labs  11/05/12 1154 11/05/12 1726 11/05/12 2053  GLUCAP 101* 144* 173*    Wt Readings from Last 3 Encounters:  11/06/12 162.47 kg (358 lb 2.9 oz)  10/21/12 149.7 kg (330 lb 0.5 oz)  10/21/12 149.7 kg (330 lb 0.5 oz)    Physical Exam:  Constitutional: He is oriented to person, place, and time. He appears well-developed and well-nourished.  Morbidly obese HENT:  Head: Normocephalic and atraumatic.  Mouth/Throat: Abnormal dentition. Dental caries present. Oropharyngeal exudate present.  Neck: Normal range of motion.  Trach site sutured closed. Sl drainage. Phonation better. Cardiovascular: Normal rate and regular rhythm.  Pulmonary/Chest: Effort normal.   no wheezes a few rhonchi. No distress.  Abdominal: Soft. Bowel sounds are normal. He exhibits no distension. There is no tenderness.  Musculoskeletal: He exhibits edema (1+ pedal edema. LLE and 2+ RLE). . Both feet/ankles without redness or warmth today.   Neurological: He is alert and oriented to person, place, and time.  Dysphonic,  . Right facial droop,lower facial and frontal droop, unable to close right eyelid. difficulty at times controlling secretions. Impulsive. Some improvement insight and awareness.  Able to follow basic commands  without difficulty. BUE with weakness and decrease in fine motor movements right greater than left. Strength grossly   3+ UE,  3, LE's. Good sitting balance  Skin: Skin is warm and dry.   Stasis changes bilateral shins. Heels with dry skin.  Psych: more relaxed   Assessment/Plan: 1. 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 Physiatrist is providing close team supervision and 24 hour management of active medical problems listed below. Physiatrist and rehab team continue to assess barriers to discharge/monitor patient progress toward functional and medical goals.   Reviewed with patient the fact that he won't be "cured" when he leaves rehab. His improvement from his vestibular and facial nerve deficits will take weeks and months. He lacks insight and awareness and is impulsive.    FIM: FIM - Bathing Bathing Steps Patient Completed: Chest;Right Arm;Left Arm;Abdomen;Front perineal area Bathing: 3: Mod-Patient completes 5-7 7f 10 parts or 50-74%  FIM - Upper Body Dressing/Undressing Upper body dressing/undressing: 0: Wears gown/pajamas-no public clothing FIM - Lower Body Dressing/Undressing Lower body dressing/undressing: 0: Wears gown/pajamas-no public clothing  FIM - Toileting Toileting steps completed by patient: Adjust clothing prior to toileting Toileting Assistive Devices: Grab bar or rail for support Toileting: 3: Mod-Patient completed 2 of 3 steps  FIM - Radio producer Devices: Grab bars Toilet Transfers: 5-To toilet/BSC: Supervision (verbal cues/safety issues);5-From toilet/BSC: Supervision (  verbal cues/safety issues)  FIM - Engineer, site Assistive Devices: Walker;HOB elevated;Bed rails Bed/Chair Transfer: 6:  Supine > Sit: No assist;5: Sit > Supine: Supervision (verbal cues/safety issues);5: Bed > Chair or W/C: Supervision (verbal cues/safety issues);5: Chair or W/C > Bed: Supervision (verbal cues/safety issues)  FIM - Locomotion: Wheelchair Locomotion: Wheelchair: 5: Travels 150 ft or more: maneuvers on rugs and over door sills with supervision, cueing or coaxing FIM - Locomotion: Ambulation Locomotion: Ambulation Assistive Devices: Administrator Ambulation/Gait Assistance: 4: Min guard Locomotion: Ambulation: 4: Travels 150 ft or more with minimal assistance (Pt.>75%)  Comprehension Comprehension Mode: Auditory Comprehension: 5-Understands complex 90% of the time/Cues < 10% of the time  Expression Expression Mode: Verbal Expression Assistive Devices: 6-Other (Comment) Expression: 3-Expresses basic 50 - 74% of the time/requires cueing 25 - 50% of the time. Needs to repeat parts of sentences.  Social Interaction Social Interaction: 5-Interacts appropriately 90% of the time - Needs monitoring or encouragement for participation or interaction.  Problem Solving Problem Solving Mode: Not assessed Problem Solving: 3-Solves basic 50 - 74% of the time/requires cueing 25 - 49% of the time  Memory Memory Mode: Not assessed Memory: 4-Recognizes or recalls 75 - 89% of the time/requires cueing 10 - 24% of the time  Medical Problem List and Plan:  1. DVT Prophylaxis/Anticoagulation: Mechanical: Antiembolism stockings, knee (TED hose) Bilateral lower extremities  Sequential compression devices, below knee Bilateral lower extremities.  Add lovenox.  Will check dopplers.  2. Pain Management: N/A.   prn ultram.   -increased prednisone. Added colchicine--foot looks better today  -needs to elevate legs while in bed 3. Mood: Provide ego support and redirection. LCSW to follow for evaluation.  4. Neuropsych: This patient is capable of making decisions on his/her own behalf.  5. Facial nerve palsy:  Estim with ST. Continue Lacrilube tid with patching at nights to prevent corneal abrasion. Eye patch at night. ?oculoplastics for gold pellet in eyelid 6. CAD: continue atenolol bid. Patient has declined statin per reports.  7. DM type 2- diet controlled: Will monitor BS with AC/HS CBG checks. Use SSI for elevated BS. Currently 120-200 range likely due to stress and stress dose steroids. Fair control at present 8. Chronic LE edema: Low salt diet. Added TEDs. Needs to elevate feet.--he does not do this 9. Leukocytosis: improving  -ENT closed stoma at bedside  -  cipro for a few more days then stop.  -secretions can come and go. Stoma sutured but still leaking a bit. Has to occlude with hand when speaking which he's trying to be more consistent about  -wbc's decreased    8. HTN: Monitor with bid checks. Continue tenormin bid. Blood pressures have been variable and will adjust as needed.  9. Acute renal insufficiency: resolved  LOS (Days) 15 A FACE TO FACE EVALUATION WAS PERFORMED  Livier Hendel T 11/06/2012 9:13 AM

## 2012-11-06 NOTE — Progress Notes (Signed)
Occupational Therapy Note  Patient Details  Name: [name] MRN: [id number] Date of Birth: 09-16-1960 Today's Date: 11/06/2012  Time: 1045-1140 Pt denies pain although c/o numbness in LUE; RN aware Individual Therapy  Pt sitting in w/c and chose to propel to gym for activities when offered choice of gym or cleaning up and changing clothing.  Pt engaged in dynamic standing activites and functional ambulation with RW.  Pt performed tasks with 2L O2 and sats>90%.  On RA sats >90% until conclusion of 5 min standing activity and O2 sats dropped to 89%.  When ambulating pt required verbal cues to scan to right and avoid running into objects.  Pt required verbal cues to locate objects placed on right.  Pt propelled to room at end of session.   Leotis Shames [facility] 11/06/2012, 11:46 AM

## 2012-11-06 NOTE — Progress Notes (Signed)
Speech Language Pathology Daily Session Note  Patient Details  Name: [name] MRN: [id number] Date of Birth: [birth year]  Today's Date: 11/06/2012 Time: [id number] Time Calculation (min): 45 min  Short Term Goals: Week 2: SLP Short Term Goal 1 (Week 2): Pt will recall newly learned information with Mod A question and visual cues. SLP Short Term Goal 2 (Week 2): Pt will demonstrate selective attention to a functional task for 15 minutes with Mod A verbal cues for redirection SLP Short Term Goal 3 (Week 2): Pt will utilize swallowing compensatory startegies to minimize right buccal pocketing and anterior loss with Min A verbal and question cues.  SLP Short Term Goal 4 (Week 2): pt will apply pressure to his stoma to increase vocal intensity at the phrase level with Min A verbal and visual cues.   Skilled Therapeutic Interventions: Treatment focus on dysarthria and dysphagia goals. SLP facilitated session by utilizing NMES. Electrodes placed lateral to right buccinator muscle and slightly posterior and superior to initial electrode (4a position). He tolerated up to 8.5 mA during volitional oral-motor exercises for labial ROM. Pt. demonstrated increased social interaction and verbal initiation/conversation with clinician and required min verbal and visual cues to apply pressure to stoma site to increase vocal intensity. Pt also consumed trials of Dys. 2 textures but was unable to efficiently masticate. Pt verbalized he would like to continue Dys. 1 textures and does not feel "comfortable" with more chewable textures.    FIM:  Comprehension Comprehension Mode: Auditory Comprehension: 5-Follows basic conversation/direction: With extra time/assistive device Expression Expression: 4-Expresses basic 75 - 89% of the time/requires cueing 10 - 24% of the time. Needs helper to occlude trach/needs to repeat words. Social Interaction Social Interaction: 5-Interacts appropriately 90% of the time -  Needs monitoring or encouragement for participation or interaction. Problem Solving Problem Solving: 4-Solves basic 75 - 89% of the time/requires cueing 10 - 24% of the time Memory Memory: 4-Recognizes or recalls 75 - 89% of the time/requires cueing 10 - 24% of the time FIM - Eating Eating Activity: 5: Supervision/cues  Pain Pain Assessment Pain Assessment: No/denies pain  Therapy/Group: Individual Therapy  Jamee Pacholski 11/06/2012, 4:46 PM

## 2012-11-07 ENCOUNTER — Inpatient Hospital Stay (HOSPITAL_COMMUNITY): Payer: BC Managed Care – PPO

## 2012-11-07 ENCOUNTER — Inpatient Hospital Stay (HOSPITAL_COMMUNITY): Payer: BC Managed Care – PPO | Admitting: Speech Pathology

## 2012-11-07 ENCOUNTER — Inpatient Hospital Stay (HOSPITAL_COMMUNITY): Payer: BC Managed Care – PPO | Admitting: Physical Therapy

## 2012-11-07 LAB — GLUCOSE, CAPILLARY
Glucose-Capillary: 115 mg/dL — ABNORMAL HIGH (ref 70–99)
Glucose-Capillary: 170 mg/dL — ABNORMAL HIGH (ref 70–99)
Glucose-Capillary: 171 mg/dL — ABNORMAL HIGH (ref 70–99)

## 2012-11-07 NOTE — Progress Notes (Signed)
Patient ID: [name], male   DOB: October 16, 1960, 52 y.o.   MRN: [id number] Subjective/Complaints: Feet feeling better. He's beginning to feel like his "old self". He notes Increased urination. Denies urgency or dysuria A 12 point review of systems has been performed and if not noted above is otherwise negative.   Objective: Vital Signs: Blood pressure 146/78, pulse 58, temperature 98.1 F (36.7 C), temperature source Oral, resp. rate 18, weight 159.8 kg (352 lb 4.7 oz), SpO2 96.00%. No results found.  Recent Labs  11/06/12 0559  WBC 9.1  HGB 12.5*  HCT 38.8*  PLT 215    Recent Labs  11/06/12 0559  NA 135  K 4.3  CL 97  GLUCOSE 101*  BUN 14  CREATININE 0.67  CALCIUM 9.3   CBG (last 3)   Recent Labs  11/06/12 1705 11/06/12 2042 11/07/12 0717  GLUCAP 134* 143* 115*    Wt Readings from Last 3 Encounters:  11/07/12 159.8 kg (352 lb 4.7 oz)  10/21/12 149.7 kg (330 lb 0.5 oz)  10/21/12 149.7 kg (330 lb 0.5 oz)    Physical Exam:  Constitutional: He is oriented to person, place, and time. He appears well-developed and well-nourished.  Morbidly obese HENT:  Head: Normocephalic and atraumatic.  Mouth/Throat: Abnormal dentition. Dental caries present. Oropharyngeal exudate present.  Neck: Normal range of motion.  Trach site sutured closed. decreased drainage. Phonation and speech much better. Cardiovascular: Normal rate and regular rhythm.  Pulmonary/Chest: Effort normal.   no wheezes a few rhonchi. No distress.  Abdominal: Soft. Bowel sounds are normal. He exhibits no distension. There is no tenderness.  Musculoskeletal: He exhibits edema (1+ pedal edema. LLE and 1++ RLE). . Both feet/ankles without redness or warmth today. Right foot minimall tender  Neurological: He is alert and oriented to person, place, and time.  Dysphonic,  . Right facial droop,lower facial and frontal droop, unable to close right eyelid. difficulty at times controlling secretions.  Impulsive. Some improvement insight and awareness.  Able to follow basic commands without difficulty. BUE with weakness and decrease in fine motor movements right greater than left. Strength grossly   3+ UE,  3, LE's. Good sitting balance  Skin: Skin is warm and dry.   Stasis changes bilateral shins. Heels with dry skin.  Psych: more relaxed   Assessment/Plan: 1. 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 Physiatrist is providing close team supervision and 24 hour management of active medical problems listed below. Physiatrist and rehab team continue to assess barriers to discharge/monitor patient progress toward functional and medical goals.      FIM: FIM - Bathing Bathing Steps Patient Completed: Chest;Right Arm;Left Arm;Abdomen;Front perineal area Bathing: 3: Mod-Patient completes 5-7 26f 10 parts or 50-74%  FIM - Upper Body Dressing/Undressing Upper body dressing/undressing: 0: Wears gown/pajamas-no public clothing FIM - Lower Body Dressing/Undressing Lower body dressing/undressing: 0: Wears gown/pajamas-no public clothing  FIM - Toileting Toileting steps completed by patient: Adjust clothing prior to toileting Toileting Assistive Devices: Grab bar or rail for support Toileting: 3: Mod-Patient completed 2 of 3 steps  FIM - Radio producer Devices: Grab bars Toilet Transfers: 5-To toilet/BSC: Supervision (verbal cues/safety issues);5-From toilet/BSC: Supervision (verbal cues/safety issues)  FIM - Engineer, site Assistive Devices: Walker;HOB elevated;Bed rails Bed/Chair Transfer: 3: Bed > Chair or W/C: Mod A (lift or  lower assist)  FIM - Locomotion: Wheelchair Locomotion: Wheelchair: 1: Total Assistance/staff  pushes wheelchair (Pt<25%) FIM - Locomotion: Ambulation Locomotion: Ambulation Assistive Devices: Administrator Ambulation/Gait Assistance: 4: Min assist Locomotion: Ambulation: 2: Travels 50 - 149 ft with minimal assistance (Pt.>75%)  Comprehension Comprehension Mode: Auditory Comprehension: 5-Follows basic conversation/direction: With extra time/assistive device  Expression Expression Mode: Verbal Expression Assistive Devices: 6-Other (Comment) Expression: 4-Expresses basic 75 - 89% of the time/requires cueing 10 - 24% of the time. Needs helper to occlude trach/needs to repeat words.  Social Interaction Social Interaction: 5-Interacts appropriately 90% of the time - Needs monitoring or encouragement for participation or interaction.  Problem Solving Problem Solving Mode: Not assessed Problem Solving: 4-Solves basic 75 - 89% of the time/requires cueing 10 - 24% of the time  Memory Memory Mode: Not assessed Memory: 4-Recognizes or recalls 75 - 89% of the time/requires cueing 10 - 24% of the time  Medical Problem List and Plan:  1. DVT Prophylaxis/Anticoagulation: Mechanical: Antiembolism stockings, knee (TED hose) Bilateral lower extremities  Sequential compression devices, below knee Bilateral lower extremities.  Add lovenox.  Will check dopplers.  2. Pain Management: N/A.   prn ultram.   -increased prednisone. Added colchicine--foot continues to improve  -needs to elevate legs while in bed 3. Mood: Provide ego support and redirection. LCSW to follow for evaluation.  4. Neuropsych: This patient is capable of making decisions on his/her own behalf.  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 Continue Lacrilube tid with patching at nights to prevent corneal abrasion. Eye patch at night. ?oculoplastics for gold pellet in eyelid as outpt 6. CAD: continue atenolol bid. Patient has declined statin per reports.  7. DM type 2- diet controlled: Will monitor BS with AC/HS CBG checks. Use SSI  for elevated BS. Currently 120-200 range likely due to stress and stress dose steroids. Fair control at present 8. Chronic LE edema: Low salt diet. Added TEDs. Needs to elevate feet.--he does not do this 9. Leukocytosis: improving  -ENT closed stoma at bedside--sutures out next week  -  cipro for a few more days then stop.  -secretions decreasing, voice better.   -wbc's decreased    8. HTN: Monitor with bid checks. Continue tenormin bid. Blood pressures have been variable and will adjust as needed.  9. Acute renal insufficiency: resolved  LOS (Days) 16 A FACE TO FACE EVALUATION WAS PERFORMED  Latecia Miler T 11/07/2012 8:28 AM

## 2012-11-07 NOTE — Progress Notes (Signed)
Speech Language Pathology Session & Weekly Progress Notes  Patient Details  Name: [name] MRN: [id number] Date of Birth: 12/21/1960  Today's Date: 11/07/2012 Time: 0905-1000 Time Calculation (min): 55 min  Short Term Goals: Week 2: SLP Short Term Goal 1 (Week 2): Pt will recall newly learned information with Mod A question and visual cues. SLP Short Term Goal 1 - Progress (Week 2): Met SLP Short Term Goal 2 (Week 2): Pt will demonstrate selective attention to a functional task for 15 minutes with Mod A verbal cues for redirection SLP Short Term Goal 2 - Progress (Week 2): Met SLP Short Term Goal 3 (Week 2): Pt will utilize swallowing compensatory startegies to minimize right buccal pocketing and anterior loss with Min A verbal and question cues.  SLP Short Term Goal 3 - Progress (Week 2): Met SLP Short Term Goal 4 (Week 2): pt will apply pressure to his stoma to increase vocal intensity at the phrase level with Min A verbal and visual cues.  SLP Short Term Goal 4 - Progress (Week 2): Met  New Short Term Goals: Week 3: SLP Short Term Goal 1 (Week 3): Pt will utilize memory compensatory strategies to increase recall/carryover of newly learned information with Min A question cues.  SLP Short Term Goal 2 (Week 3): Pt will demonstrate alternating attention between two tasks for 30 minutes with Mod A vebal cues for redirection. SLP Short Term Goal 3 (Week 3): Pt will utilize swallowing compensatory strategies with Mod I. SLP Short Term Goal 4 (Week 3): Pt will apply pressure to stoma during verbal expression to increase vocal intensity with supervision verbal cues.  SLP Short Term Goal 5 (Week 3): Pt will consume trials of Dys. 2 textures and demonstrate efficient mastication with 100% of trials with Mod A verbal cues.   Weekly Progress Updates: Pt has met 4 of 4 STG's this reporting period due to improved selective attention, improved working memory, increased utilization of speech  intelligibility strategies with increased vocal intensity and increased utilization of swallowing compensatory strategies. Pt requires Min A verbal and visual cues to apply pressure to his stoma during functional communication, Min A verbal cues for working memory and selective attention and intermittent supervision verbal cues for utilization of swallowing compensatory strategies. Pt also demonstrates increased sensation on his right facial musculature which has increased his ability to self-monitor and correct anterior spillage throughout meals. Pt would benefit from continued skilled SLP intervention to maximize cognitive function, speech intelligibility and swallowing function with least restrictive diet.    SLP Frequency: 5 out of 7 days SLP Duration/Estimated Length of Stay: 1 week SLP Treatment/Interventions: Cognitive remediation/compensation;Cueing hierarchy;Environmental controls;Dysphagia/aspiration precaution training;Functional tasks;Neuromuscular electrical stimulation;Patient/family education;Therapeutic Activities;Therapeutic Exercise;Speech/Language facilitation;Oral motor exercises;Internal/external aids  Daily Session Skilled Therapeutic Intervention: Treatment focus on dysphagia and dysarthria goals. Pt consumed current diet of Dys. 1 textures and thin liquids without overt s/s of aspiration.  Pt verbalizes increased sensation in his right facial musculature with improved labial seal with liquids via cup and self-monitoring and correcting of right anterior spillage but continues to require intermittent verbal cues for utilization of small bites. Pt also required Min A verbal and visual cues to apply pressure to stoma at the sentence level to increase vocal intensity for functional communication. NMES was also utilized and electrodes were placed lateral to right buccinator muscle and slightly posterior and superior to initial electrode (4a position). He tolerated up to 11.0 mA during  volitional oral-motor exercises for labial ROM.  FIM:  Comprehension Comprehension Mode: Auditory Comprehension: 5-Understands basic 90% of the time/requires cueing < 10% of the time Expression Expression: 4-Expresses basic 75 - 89% of the time/requires cueing 10 - 24% of the time. Needs helper to occlude trach/needs to repeat words. Social Interaction Social Interaction: 5-Interacts appropriately 90% of the time - Needs monitoring or encouragement for participation or interaction. Problem Solving Problem Solving: 4-Solves basic 75 - 89% of the time/requires cueing 10 - 24% of the time Memory Memory: 4-Recognizes or recalls 75 - 89% of the time/requires cueing 10 - 24% of the time FIM - Eating Eating Activity: 5: Supervision/cues  Pain No/Denies Pain  Therapy/Group: Individual Therapy  [name] 11/07/2012, 11:04 AM  [address], [place], [place]

## 2012-11-07 NOTE — Progress Notes (Signed)
Physical Therapy Weekly Progress Note  Patient Details  Name: [name] MRN: [id number] Date of Birth: 1960-09-03  Today's Date: 11/07/2012 Time: 1030-1130 Time Calculation (min): 60 min  Patient has met 2 of 3 short term goals.  Pt did not meet goal of transfer bed <> wheelchair with modified independence as he continues to need fluctuating supervision/min assist needs secondary to prolonged weakness and decreased safety awareness. Pt requires motivation to initiate therapies however once he starts is eager to move and advance to goals.   Patient continues to demonstrate the following deficits: generalized deconditioning, impaired balance, decreased safety awareness, poor endurance, and increased need for physical assist and therefore will continue to benefit from skilled PT intervention to enhance overall performance with activity tolerance, balance, ability to compensate for deficits and awareness.  Patient progressing toward long term goals..  Continue plan of care.  PT Short Term Goals Week 2:  PT Short Term Goal 1 (Week 2): Pt will ambulate 62' with supervision and RW PT Short Term Goal 1 - Progress (Week 2): Met PT Short Term Goal 2 (Week 2): Pt will perform vestibular exercises with only supervision cues needed PT Short Term Goal 2 - Progress (Week 2): Met PT Short Term Goal 3 (Week 2): Pt will transfer wheelchair <> mat with modified independence PT Short Term Goal 3 - Progress (Week 2): Not met  Skilled Therapeutic Interventions/Progress Updates:    Pt desired to wear button down shirt at beginning of session, PT to start first button, pt needed 4 verbal cues of encouragement to try to button shirt and then was able to complete task while carrying on a conversation. Pt initially declined ambulation however with encouragement ambulated from room to gym ~150' with RW, min-guard assist and cues for posture, proximity of RW, and narrowing of base of support. Pt ascended/descended  curb and ramp x 3 reps with seated rest between each, mod assist needed with first two attempts secondary to unsafe placement of RW despite PT demonstration first. Min assist for last attempt. Pt also engaged in standing balance activity with narrowed base of support while attempting bil. PNF UE patterns with yellow weighted ball, no UE support with up to min assist for balance.   Pt distracted/concerned over Lt. UE/Lt. LE numbness and weakness throughout session and reports it started yesterday. Nursing and PA made aware.   Therapy Documentation Precautions:  Precautions Precautions: Fall Precaution Comments: dizziness with mobility, deaf R ear, blind R eye, double vision, open stoma Restrictions Weight Bearing Restrictions: No Vital Signs: Oxygen Therapy SpO2: 96 % O2 Device: Nasal cannula O2 Flow Rate (L/min): 2 L/min Pain: Pain Assessment Pain Assessment: 0-10 Pain Score:   5 Pain Type: Acute pain Pain Location: Head Pain Orientation: Right Pain Descriptors: Headache   See FIM for current functional status  Therapy/Group: Individual Therapy  Lahoma Rocker 11/07/2012, 11:58 AM

## 2012-11-07 NOTE — Progress Notes (Signed)
Occupational Therapy Session Note  Patient Details  Name: [name] MRN: [id number] Date of Birth: Dec 23, 1960  Today's Date: 11/07/2012  Session 1 Time: [id number] Time Calculation (min): 45 min  Short Term Goals: Week 3:  OT Short Term Goal 1 (Week 3): Pt will perform toileting tasks at mod I OT Short Term Goal 2 (Week 3): Pt will complete bathing with supervision using AE prn OT Short Term Goal 3 (Week 3): Pt will perform LB dressing with min A.  Skilled Therapeutic Interventions/Progress Updates:    Pt engaged in bathing and dressing tasks sit<>stand from w/c at sink.  Pt stood at sink to bathe perineal area and buttocks.  Pt issued long handle sponge but declined using this morning.  Pt requested therapist to clean area also to assure thoroughness.  Pt able to thread left pants leg this morning but required assistance with threading right leg.  Pt stood at sink to pull up pants.  Pt stated he was feeling good this morning and was optimistic about progress in therapy.  Focus on activity tolerance, sit<>stand, standing balance, and safety awareness.  Therapy Documentation Precautions:  Precautions Precautions: Fall Precaution Comments: dizziness with mobility, deaf R ear, blind R eye, double vision, open stoma Restrictions Weight Bearing Restrictions: No Pain: Pain Assessment Pain Assessment: 0-10 Pain Score:   5 Pain Type: Acute pain Pain Location: Head Pain Orientation: Right Pain Descriptors: Headache Pain Onset: Gradual Patients Stated Pain Goal: 2 Pain Intervention(s): Medication (See eMAR) (vicodin)  See FIM for current functional status  Therapy/Group: Individual Therapy  Session 2 Time: [id number] Pt denies pain but continues to c/o numbness in LUE and LLE; RN and PA aware Individual Therapy  Pt engaged in dynamic standing activities with focus on BUE use while standing.  Pt c/o numbness in LUE but is able to use functionally.  Only minimal variance in  strength between RUE and LUE; coordinaiton WFL.  Pt returned to bed at end of session.  Pt required only supervision for sit->supine.  Leotis Shames [facility] 11/07/2012, 11:17 AM

## 2012-11-08 ENCOUNTER — Inpatient Hospital Stay (HOSPITAL_COMMUNITY): Payer: BC Managed Care – PPO | Admitting: Physical Therapy

## 2012-11-08 ENCOUNTER — Inpatient Hospital Stay (HOSPITAL_COMMUNITY): Payer: BC Managed Care – PPO

## 2012-11-08 ENCOUNTER — Inpatient Hospital Stay (HOSPITAL_COMMUNITY): Payer: BC Managed Care – PPO | Admitting: Speech Pathology

## 2012-11-08 DIAGNOSIS — R269 Unspecified abnormalities of gait and mobility: Secondary | ICD-10-CM

## 2012-11-08 DIAGNOSIS — D333 Benign neoplasm of cranial nerves: Secondary | ICD-10-CM

## 2012-11-08 DIAGNOSIS — E669 Obesity, unspecified: Secondary | ICD-10-CM

## 2012-11-08 DIAGNOSIS — R5381 Other malaise: Secondary | ICD-10-CM

## 2012-11-08 DIAGNOSIS — J209 Acute bronchitis, unspecified: Secondary | ICD-10-CM

## 2012-11-08 DIAGNOSIS — G519 Disorder of facial nerve, unspecified: Secondary | ICD-10-CM

## 2012-11-08 LAB — GLUCOSE, CAPILLARY: Glucose-Capillary: 107 mg/dL — ABNORMAL HIGH (ref 70–99)

## 2012-11-08 NOTE — Progress Notes (Addendum)
Pt's O2 Sat at rest on 1.5 l's  97%;  Down to 1 L...remained at 97% at rest. Mild anxiety evident. 1 Norco given, "I get a bad headache when you turn the oxygen down."  Discussed weaning process with patient. Continue to monitor sat's at rest and with exertion.  Pt.s sats 93% sustained on ROOM AIR at rest.  Per P.T. report pt did drop 88-89% with exertion. Anxious, reports headache, Norco effective.

## 2012-11-08 NOTE — Progress Notes (Signed)
Occupational Therapy Session Note  Patient Details  Name: [name] MRN: [id number] Date of Birth: 01-27-1961  Today's Date: 11/08/2012  Session 1 Time: [id number] Time Calculation (min): 58 min  Short Term Goals: Week 3:  OT Short Term Goal 1 (Week 3): Pt will perform toileting tasks at mod I OT Short Term Goal 2 (Week 3): Pt will complete bathing with supervision using AE prn OT Short Term Goal 3 (Week 3): Pt will perform LB dressing with min A.  Skilled Therapeutic Interventions/Progress Updates:    Pt seated EOB upon arrival.  Pt declined bathing and dressing this morning and requested to "work on my standing and left arm."  Pt engaged in sit<>stand with and without RW, dynamic standing tasks, functional amb in room over to sink for standing tasks.  Pt continues to require steady assist with dynamic standing tasks and ambulation.  O2 sats>90% on 1.5L O2 throughout all activities.  Therapy Documentation Precautions:  Precautions Precautions: Fall Precaution Comments: dizziness with mobility, deaf R ear, blind R eye, double vision, open stoma Restrictions Weight Bearing Restrictions: No Pain: Pain Assessment Pain Assessment: No/denies pain  See FIM for current functional status  Therapy/Group: Individual Therapy  Session 2 Time: [id number] Individual therapy  Pt seated EOB with friends at bedside.  Pt agreeable to engage in dynamic standing tasks in gym.  Pt amb with RW from bed to w/c and propelled to gym.  Pt engaged in standing activities including tossing ball and bouncing ball to friends; with and without O2.  Pt O2 sats<86% on RA but recovered to 90%+ on 2L O2 withing 60 seconds.  Pt stood to reach for rings and toss them at posts before walking with RW to gather them from floor with reacher.  Near end of session pt requested to use toilet.  In room pt amb with RW to toilet.  Pt completed all tasks with close supervision including toileting tasks.  Session  originally scheduled as family education session with wife but pt's wife did not show up.  Leotis Shames [facility] 11/08/2012, 11:14 AM

## 2012-11-08 NOTE — Progress Notes (Signed)
Speech Language Pathology Daily Session Note  Patient Details  Name: [name] MRN: [id number] Date of Birth: May 27, 1961  Today's Date: 11/08/2012 Time: 1430-1530 Time Calculation (min): 60 min  Short Term Goals: Week 3: SLP Short Term Goal 1 (Week 3): Pt will utilize memory compensatory strategies to increase recall/carryover of newly learned information with Min A question cues.  SLP Short Term Goal 2 (Week 3): Pt will demonstrate alternating attention between two tasks for 30 minutes with Mod A vebal cues for redirection. SLP Short Term Goal 3 (Week 3): Pt will utilize swallowing compensatory strategies with Mod I. SLP Short Term Goal 4 (Week 3): Pt will apply pressure to stoma during verbal expression to increase vocal intensity with supervision verbal cues.  SLP Short Term Goal 5 (Week 3): Pt will consume trials of Dys. 2 textures and demonstrate efficient mastication with 100% of trials with Mod A verbal cues.   Skilled Therapeutic Interventions: Treatment focus on dysphagia and dysarthria goals. Pt required supervision verbal and visual cues to apply pressure to stoma throughout a functional conversation to increase vocal intensity and overall speech intelligibility. NMES was also utilized throughout functional conversation and oral-motor exercises. Electrodes were placed lateral to right buccinator muscle and slightly posterior and superior to initial electrode (4a position). He tolerated up to 10.0 mA and labial contractions were observed while performing oral-motor exercises. Pt continues to refuse trials of Dys. 2 textures due to fear and anxiety of "choking" despite encouragement and reassurance from clinician.     FIM:  Comprehension Comprehension Mode: Auditory Comprehension: 5-Understands complex 90% of the time/Cues < 10% of the time Expression Expression: 4-Expresses basic 75 - 89% of the time/requires cueing 10 - 24% of the time. Needs helper to occlude trach/needs to  repeat words. Social Interaction Social Interaction: 5-Interacts appropriately 90% of the time - Needs monitoring or encouragement for participation or interaction. Problem Solving Problem Solving: 4-Solves basic 75 - 89% of the time/requires cueing 10 - 24% of the time Memory Memory: 4-Recognizes or recalls 75 - 89% of the time/requires cueing 10 - 24% of the time FIM - Eating Eating Activity: 5: Supervision/cues  Pain Pain Assessment Pain Assessment: No/denies pain  Therapy/Group: Individual Therapy  Dillinger Aston 11/08/2012, 4:19 PM

## 2012-11-08 NOTE — Progress Notes (Signed)
Physical Therapy Session Note  Patient Details  Name: [name] MRN: [id number] Date of Birth: 11/08/1960  Today's Date: 11/08/2012 Time: 1345-1430 Time Calculation (min): 45 min  Short Term Goals: Week 2:  PT Short Term Goal 1 (Week 2): Pt will ambulate 62' with supervision and RW PT Short Term Goal 1 - Progress (Week 2): Met PT Short Term Goal 2 (Week 2): Pt will perform vestibular exercises with only supervision cues needed PT Short Term Goal 2 - Progress (Week 2): Met PT Short Term Goal 3 (Week 2): Pt will transfer wheelchair <> mat with modified independence PT Short Term Goal 3 - Progress (Week 2): Not met  Skilled Therapeutic Interventions/Progress Updates:    Bathroom mobility with min-guard assist, pt reporting needing to go to bathroom often and urgently this PM. Ambulation to gym with RW and close supervision/min-guard assist, cues for upright posture. Noted to have improved base of support this session. Ramp and Curb practiced with RW and min-guard assist, pt with good carryover from yesterday only required min cues for sequencing and safety. Pt abruptly had to return to room from gym secondary to bowels. Bathroom mobility with RW, close supervision overall. Room negotiation around obstacles and narrow spaces with close supervision.   SpO2 down to 87% on room air post ambulation, with 2L [place] O2 pt returned to 93%.   Pt's wife not present for family ed nor was rollator walker present although this therapist has been requesting it be brought in to practice with/assess for safety (pt reports he will be using the rollator upon return home.) Pt declines a wheelchair as he reports it will not fit in house.   Therapy Documentation Precautions:  Precautions Precautions: Fall Precaution Comments: dizziness with mobility, deaf R ear, blind R eye, double vision, open stoma Restrictions Weight Bearing Restrictions: No Pain: Pain Assessment Pain Assessment: No/denies pain  See  FIM for current functional status  Therapy/Group: Individual Therapy  Lahoma Rocker 11/08/2012, 3:49 PM

## 2012-11-08 NOTE — Progress Notes (Signed)
Patient ID: [name], male   DOB: [birth year], 52 y.o.   MRN: [id number] Subjective/Complaints: Feet feeling better. He's beginning to feel like his "old self". He notes Increased urination. Denies urgency or dysuria A 12 point review of systems has been performed and if not noted above is otherwise negative.   Objective: Vital Signs: Blood pressure 156/88, pulse 59, temperature 98.1 F (36.7 C), temperature source Oral, resp. rate 20, weight 159.8 kg (352 lb 4.7 oz), SpO2 98.00%. Ct Head Wo Contrast  11/07/2012  *RADIOLOGY REPORT*  Clinical Data: Status post right cerebellopontine acoustic neuroma resection.  Left sided numbness and tingling.  CT HEAD WITHOUT CONTRAST  Technique:  Contiguous axial images were obtained from the base of the skull through the vertex without contrast.  Comparison: Head CT 02/23/2014no brain MRI 08/12/2012  Findings: Stable low attenuation in the right cerebellar peduncle, in this patient status post resection of a right cerebellopontine angle mass.  The ventricles are normal in size.  Negative for hemorrhage, hydrocephalus, midline shift, or evidence of acute cortically based infarction.  The previously described subcutaneous and subdural gas has resolved.  There are postoperative changes of the right rectosigmoid craniectomy.  There is some fluid and within the craniectomy site.  The imaged paranasal sinuses are clear. There is fluid in the right mastoid air cells.  IMPRESSION:  1.  No acute intracranial abnormality identified. Interval resolution of subcutaneous and subdural gas in the CT of 10/13/2012. 2.  Stable postoperative changes of resection of a right cerebellopontine angle mass. 3.  Right mastoid effusion.   Original Report Authenticated By: Curlene Dolphin, M.D.     Recent Labs  11/06/12 0559  WBC 9.1  HGB 12.5*  HCT 38.8*  PLT 215    Recent Labs  11/06/12 0559  NA 135  K 4.3  CL 97  GLUCOSE 101*  BUN 14  CREATININE 0.67  CALCIUM 9.3    CBG (last 3)   Recent Labs  11/07/12 1204 11/07/12 1644 11/07/12 2034  GLUCAP 83 170* 171*    Wt Readings from Last 3 Encounters:  11/07/12 159.8 kg (352 lb 4.7 oz)  10/21/12 149.7 kg (330 lb 0.5 oz)  10/21/12 149.7 kg (330 lb 0.5 oz)    Physical Exam:  Constitutional: He is oriented to person, place, and time. He appears well-developed and well-nourished.  Morbidly obese HENT:  Head: Normocephalic and atraumatic.  Mouth/Throat: Abnormal dentition. Dental caries present. Oropharyngeal exudate present.  Neck: Normal range of motion.  Trach site sutured closed. decreased drainage. Phonation and speech much better. Cardiovascular: Normal rate and regular rhythm.  Pulmonary/Chest: Effort normal.   no wheezes a few rhonchi. No distress.  Abdominal: Soft. Bowel sounds are normal. He exhibits no distension. There is no tenderness.  Musculoskeletal: He exhibits edema (1+ pedal edema. LLE and 1++ RLE). . Both feet/ankles without redness or warmth today. Right foot minimall tender  Neurological: He is alert and oriented to person, place, and time.  Dysphonic,  . Right facial droop,lower facial and frontal droop, unable to close right eyelid. difficulty at times controlling secretions. Impulsive. Some improvement insight and awareness.  Able to follow basic commands without difficulty. BUE with weakness and decrease in fine motor movements right greater than left. Strength grossly   4- to 4 in UE,  3 to 4 with more proximal weakness in  LE's. Good sitting balance. No changes in neuro exam Skin: Skin is warm and dry.   Stasis changes bilateral shins. Heels with  dry skin.  Psych: more relaxed   Assessment/Plan: 1. 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 Physiatrist is  providing close team supervision and 24 hour management of active medical problems listed below. Physiatrist and rehab team continue to assess barriers to discharge/monitor patient progress toward functional and medical goals.  Head CT without acute changes.       FIM: FIM - Bathing Bathing Steps Patient Completed: Chest;Right Arm;Left Arm;Abdomen;Front perineal area;Buttocks;Right upper leg;Left upper leg Bathing: 4: Min-Patient completes 8-9 66f 10 parts or 75+ percent  FIM - Upper Body Dressing/Undressing Upper body dressing/undressing steps patient completed: Thread/unthread right sleeve of front closure shirt/dress;Pull shirt around back of front closure shirt/dress;Thread/unthread left sleeve of front closure shirt/dress;Button/unbutton shirt Upper body dressing/undressing: 5: Supervision: Safety issues/verbal cues FIM - Lower Body Dressing/Undressing Lower body dressing/undressing steps patient completed: Thread/unthread left pants leg;Pull pants up/down Lower body dressing/undressing: 3: Mod-Patient completed 50-74% of tasks  FIM - Toileting Toileting steps completed by patient: Adjust clothing prior to toileting Toileting Assistive Devices: Grab bar or rail for support Toileting: 3: Mod-Patient completed 2 of 3 steps  FIM - Radio producer Devices: Grab bars Toilet Transfers: 5-To toilet/BSC: Supervision (verbal cues/safety issues);5-From toilet/BSC: Supervision (verbal cues/safety issues)  FIM - Engineer, site Assistive Devices: Walker;HOB elevated;Bed rails Bed/Chair Transfer: 3: Bed > Chair or W/C: Mod A (lift or lower assist)  FIM - Locomotion: Wheelchair Locomotion: Wheelchair: 5: Travels 150 ft or more: maneuvers on rugs and over door sills with supervision, cueing or coaxing FIM - Locomotion: Ambulation Locomotion: Ambulation Assistive Devices: Administrator Ambulation/Gait Assistance: 4: Min guard Locomotion:  Ambulation: 4: Travels 150 ft or more with minimal assistance (Pt.>75%)  Comprehension Comprehension Mode: Auditory Comprehension: 5-Understands complex 90% of the time/Cues < 10% of the time  Expression Expression Mode: Verbal Expression Assistive Devices: 6-Other (Comment) Expression: 4-Expresses basic 75 - 89% of the time/requires cueing 10 - 24% of the time. Needs helper to occlude trach/needs to repeat words.  Social Interaction Social Interaction: 5-Interacts appropriately 90% of the time - Needs monitoring or encouragement for participation or interaction.  Problem Solving Problem Solving Mode: Not assessed Problem Solving: 4-Solves basic 75 - 89% of the time/requires cueing 10 - 24% of the time  Memory Memory Mode: Not assessed Memory: 4-Recognizes or recalls 75 - 89% of the time/requires cueing 10 - 24% of the time  Medical Problem List and Plan:  1. DVT Prophylaxis/Anticoagulation: Mechanical: Antiembolism stockings, knee (TED hose) Bilateral lower extremities  Sequential compression devices, below knee Bilateral lower extremities.  Add lovenox.  Will check dopplers.  2. Pain Management: N/A.   prn ultram.   -increased prednisone. Added colchicine--foot continues to improve  -needs to elevate legs while in bed 3. Mood: Provide ego support and redirection.  -needs regular reassurance, explanations, and egosupport  4. Neuropsych: This patient is capable of making decisions on his/her own behalf.  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 Continue Lacrilube tid with patching at nights to prevent corneal abrasion. Eye patch at night. ?oculoplastics for gold pellet in eyelid as outpt 6. CAD: continue atenolol bid. Patient has declined statin per reports.  7. DM type 2- diet controlled: Will monitor BS with AC/HS CBG checks. Use SSI for elevated BS. Currently 120-200 range likely due to stress and stress dose steroids. Fair  control at present 8. Chronic LE edema: Low salt diet.  Added TEDs. Needs to elevate feet.--he does not do this 9. Leukocytosis: improving  -ENT closed stoma at bedside--sutures out next week  -dc abx.  -secretions decreasing, voice better.   -wbc's decreased    8. HTN: Monitor with bid checks. Continue tenormin bid. Blood pressures have been variable and will adjust as needed.  9. Acute renal insufficiency: resolved  LOS (Days) 17 A FACE TO FACE EVALUATION WAS PERFORMED  SWARTZ,ZACHARY T 11/08/2012 9:12 AM

## 2012-11-09 ENCOUNTER — Inpatient Hospital Stay (HOSPITAL_COMMUNITY): Payer: BC Managed Care – PPO | Admitting: *Deleted

## 2012-11-09 LAB — GLUCOSE, CAPILLARY
Glucose-Capillary: 97 mg/dL (ref 70–99)
Glucose-Capillary: 159 mg/dL — ABNORMAL HIGH (ref 70–99)

## 2012-11-09 NOTE — Progress Notes (Signed)
Physical Therapy Session Note  Patient Details  Name: [name] MRN: [id number] Date of Birth: 1961-02-18  Today's Date: 11/09/2012 Time: 1430-1530 Time Calculation (min): 60 min  Short Term Goals: Week 2:  PT Short Term Goal 1 (Week 2): Pt will ambulate 80' with supervision and RW PT Short Term Goal 1 - Progress (Week 2): Met PT Short Term Goal 2 (Week 2): Pt will perform vestibular exercises with only supervision cues needed PT Short Term Goal 2 - Progress (Week 2): Met PT Short Term Goal 3 (Week 2): Pt will transfer wheelchair <> mat with modified independence PT Short Term Goal 3 - Progress (Week 2): Not met  Skilled Therapeutic Interventions/Progress Updates:    Patient participated in LE strengthening exercise group: B hip flexion, B hip adduction with 5" squeeze, B hip abduction, B knee extension, B knee flexion, ankle pumps, 2x20 for warm up. Exercises repeated with 4# weight for strengthening. Patient performed gait training with rolling walker and supervision x60'. Patient exercised on NuStep Level 10 with B UE and B LE x12' for increased strengthening, coordination, and endurance. Patient propelled wheelchair 150' with supervision and B UE.   Therapy Documentation Precautions:  Precautions Precautions: Fall Precaution Comments: dizziness with mobility, deaf R ear, blind R eye, double vision, open stoma Restrictions Weight Bearing Restrictions: No Pain: Pain Assessment Pain Assessment: No/denies pain Pain Score: 0-No pain  See FIM for current functional status  Therapy/Group: Group Therapy  Cher Egnor S Agripina Guyette S. Cephas Revard, PT, DPT  11/09/2012, 3:42 PM

## 2012-11-09 NOTE — Progress Notes (Signed)
Patient up in wheelchair this shift for about an hour before bedtime in the family room. No complaints of headache. Continue plan of care.

## 2012-11-09 NOTE — Progress Notes (Signed)
Patient ID: MANPREET MIZZELL, male   DOB: [birth year], 52 y.o.   MRN: [id number] Subjective/Complaints: Happy with progress. Realizes that there will be things to work on beyond rehab. A 12 point review of systems has been performed and if not noted above is otherwise negative.   Objective: Vital Signs: Blood pressure 144/78, pulse 61, temperature 97.7 F (36.5 C), temperature source Oral, resp. rate 20, weight 159.8 kg (352 lb 4.7 oz), SpO2 96.00%. Ct Head Wo Contrast  11/07/2012  *RADIOLOGY REPORT*  Clinical Data: Status post right cerebellopontine acoustic neuroma resection.  Left sided numbness and tingling.  CT HEAD WITHOUT CONTRAST  Technique:  Contiguous axial images were obtained from the base of the skull through the vertex without contrast.  Comparison: Head CT 02/23/2014no brain MRI 08/12/2012  Findings: Stable low attenuation in the right cerebellar peduncle, in this patient status post resection of a right cerebellopontine angle mass.  The ventricles are normal in size.  Negative for hemorrhage, hydrocephalus, midline shift, or evidence of acute cortically based infarction.  The previously described subcutaneous and subdural gas has resolved.  There are postoperative changes of the right rectosigmoid craniectomy.  There is some fluid and within the craniectomy site.  The imaged paranasal sinuses are clear. There is fluid in the right mastoid air cells.  IMPRESSION:  1.  No acute intracranial abnormality identified. Interval resolution of subcutaneous and subdural gas in the CT of 10/13/2012. 2.  Stable postoperative changes of resection of a right cerebellopontine angle mass. 3.  Right mastoid effusion.   Original Report Authenticated By: Curlene Dolphin, M.D.    No results found for this basename: WBC, HGB, HCT, PLT,  in the last 72 hours No results found for this basename: NA, K, CL, CO, GLUCOSE, BUN, CREATININE, CALCIUM,  in the last 72 hours CBG (last 3)   Recent Labs  11/08/12 1632  11/08/12 2052 11/09/12 0730  GLUCAP 172* 163* 97    Wt Readings from Last 3 Encounters:  11/07/12 159.8 kg (352 lb 4.7 oz)  10/21/12 149.7 kg (330 lb 0.5 oz)  10/21/12 149.7 kg (330 lb 0.5 oz)    Physical Exam:  Constitutional: He is oriented to person, place, and time. He appears well-developed and well-nourished.  Morbidly obese HENT:  Head: Normocephalic and atraumatic.  Mouth/Throat: Abnormal dentition. Dental caries present. Oropharyngeal exudate present.  Neck: Normal range of motion.  Phonation better. No air leakage from trach site. Cardiovascular: Normal rate and regular rhythm.  Pulmonary/Chest: Effort normal.   no wheezes a few rhonchi. No distress.  Abdominal: Soft. Bowel sounds are normal. He exhibits no distension. There is no tenderness.  Musculoskeletal: He exhibits edema (1+ pedal edema. LLE and 1+ RLE). .  Neurological: He is alert and oriented to person, place, and time.  Dysphonic,  . Right facial droop,lower facial and frontal droop, unable to close right eyelid. difficulty at times controlling secretions. Impulsive. Some improvement insight and awareness.  Able to follow basic commands without difficulty. BUE with weakness and decrease in fine motor movements right greater than left. Strength grossly   4- to 4 in UE,  3 to 4 with more proximal weakness in  LE's. Good sitting balance. No changes in neuro exam Skin: Skin is warm and dry.   color improved in both legs. No redness or warmth.  Psych: more relaxed   Assessment/Plan: 1. Functional deficits secondary to acoustic neuroma s/p resections with subsequent facial nerve injury, vestibular deficits, Right hemiataxia, deconditioning which require 3+  hours per day of interdisciplinary therapy in a comprehensive inpatient rehab setting. Physiatrist is providing close team supervision and 24 hour management of active medical problems listed below. Physiatrist and rehab team continue to assess barriers to  discharge/monitor patient progress toward functional and medical goals.          FIM: FIM - Bathing Bathing Steps Patient Completed: Chest;Right Arm;Left Arm;Abdomen;Front perineal area;Buttocks;Right upper leg;Left upper leg Bathing: 4: Min-Patient completes 8-9 43f 10 parts or 75+ percent  FIM - Upper Body Dressing/Undressing Upper body dressing/undressing steps patient completed: Thread/unthread right sleeve of front closure shirt/dress;Pull shirt around back of front closure shirt/dress;Thread/unthread left sleeve of front closure shirt/dress;Button/unbutton shirt Upper body dressing/undressing: 5: Supervision: Safety issues/verbal cues FIM - Lower Body Dressing/Undressing Lower body dressing/undressing steps patient completed: Thread/unthread left pants leg;Pull pants up/down Lower body dressing/undressing: 3: Mod-Patient completed 50-74% of tasks  FIM - Toileting Toileting steps completed by patient: Adjust clothing prior to toileting;Adjust clothing after toileting Toileting Assistive Devices: Grab bar or rail for support Toileting: 3: Mod-Patient completed 2 of 3 steps  FIM - Radio producer Devices: Grab bars Toilet Transfers: 5-To toilet/BSC: Supervision (verbal cues/safety issues);5-From toilet/BSC: Supervision (verbal cues/safety issues)  FIM - Engineer, site Assistive Devices: Walker;HOB elevated;Bed rails Bed/Chair Transfer: 5: Chair or W/C > Bed: Supervision (verbal cues/safety issues);5: Sit > Supine: Supervision (verbal cues/safety issues)  FIM - Locomotion: Wheelchair Locomotion: Wheelchair: 6: Travels 150 ft or more, turns around, maneuvers to table, bed or toilet, negotiates 3% grade: maneuvers on rugs and over door sills independently FIM - Locomotion: Ambulation Locomotion: Ambulation Assistive Devices: Administrator Ambulation/Gait Assistance: 4: Min guard Locomotion: Ambulation: 4: Travels 150 ft or more  with minimal assistance (Pt.>75%)  Comprehension Comprehension Mode: Auditory Comprehension: 5-Understands complex 90% of the time/Cues < 10% of the time  Expression Expression Mode: Verbal Expression Assistive Devices: 6-Other (Comment) Expression: 4-Expresses basic 75 - 89% of the time/requires cueing 10 - 24% of the time. Needs helper to occlude trach/needs to repeat words.  Social Interaction Social Interaction: 5-Interacts appropriately 90% of the time - Needs monitoring or encouragement for participation or interaction.  Problem Solving Problem Solving Mode: Not assessed Problem Solving: 4-Solves basic 75 - 89% of the time/requires cueing 10 - 24% of the time  Memory Memory Mode: Not assessed Memory: 4-Recognizes or recalls 75 - 89% of the time/requires cueing 10 - 24% of the time  Medical Problem List and Plan:  1. DVT Prophylaxis/Anticoagulation: Mechanical: Antiembolism stockings, knee (TED hose) Bilateral lower extremities  Sequential compression devices, below knee Bilateral lower extremities.  Add lovenox.  Will check dopplers.  2. Pain Management: N/A.   prn ultram.   -wean prednisone. Continue colchicine  -appearance of feet and pain much better 3. Mood: Provide ego support and redirection.  -needs regular reassurance, explanations, and egosupport  4. Neuropsych: This patient is capable of making decisions on his/her own behalf.  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 Continue Lacrilube tid with patching at nights to prevent corneal abrasion. Eye patch at night. ?oculoplastics for gold pellet in eyelid as outpt 6. CAD: continue atenolol bid. Patient has declined statin per reports.  7. DM type 2- diet controlled: Will monitor BS with AC/HS CBG checks. Use SSI for elevated BS. Currently 120-200 range likely due to stress and stress dose steroids. Fair control at present 8. Chronic LE edema: Low salt diet. Added TEDs. Elevate legs 9. Leukocytosis: improving  -ENT closed  stoma  at bedside--sutures out next week  -dc'ed abx.  -secretions low. Voice MUCH improved       8. HTN: Monitor with bid checks. Continue tenormin bid. Blood pressures have been variable and will adjust as needed.  9. Acute renal insufficiency: resolved  LOS (Days) 18 A FACE TO FACE EVALUATION WAS PERFORMED  Ciarra Braddy T 11/09/2012 8:15 AM

## 2012-11-10 ENCOUNTER — Encounter (HOSPITAL_COMMUNITY): Payer: BC Managed Care – PPO | Admitting: Physical Therapy

## 2012-11-10 LAB — GLUCOSE, CAPILLARY
Glucose-Capillary: 114 mg/dL — ABNORMAL HIGH (ref 70–99)
Glucose-Capillary: 71 mg/dL (ref 70–99)
Glucose-Capillary: 267 mg/dL — ABNORMAL HIGH (ref 70–99)

## 2012-11-10 MED ORDER — LISINOPRIL 10 MG PO TABS
10.0000 mg | ORAL_TABLET | Freq: Every day | ORAL | Status: DC
  Administered 2012-11-10 – 2012-11-12 (×3): 10 mg via ORAL
  Filled 2012-11-10 (×4): qty 1

## 2012-11-10 MED ORDER — PREDNISONE 20 MG PO TABS
20.0000 mg | ORAL_TABLET | Freq: Two times a day (BID) | ORAL | Status: DC
  Administered 2012-11-10 – 2012-11-11 (×3): 20 mg via ORAL
  Filled 2012-11-10 (×6): qty 1

## 2012-11-10 NOTE — Progress Notes (Signed)
Occupational Therapy Note  Patient Details  Name: [name] MRN: [id number] Date of Birth: Feb 03, 1961 Today's Date: 11/10/2012  Pt. Missed 60 minutes of OT group activity.  He refused to come.    Lisa Roca 11/10/2012, 6:42 PM

## 2012-11-10 NOTE — Progress Notes (Signed)
Patietn woke up from sleep around 0300 with some difficulty breathing. O2 applied at 1 l/min, Sats 94% and patient coughing up mucous. Symptoms began to improve some. Rechecked on patient 45 min later. Patient noted with some congestion and difficulty breathing again and requesting a breathing treatment.Marland Kitchen Respiratory team notified and reported to patient room. HR noted to be in the high 50s. VS taken, with elevated B/P noted, 185/84, 156/92, and 166/83 and HR 60. Patient also complains of sharp pain headached to R side of head above ear and also complains of shaking intermittently. Patient up in wheelchair at the nurses station. Vicodin given at 0430. Will continue to monitor patient.

## 2012-11-10 NOTE — Progress Notes (Signed)
Patient ID: [name], male   DOB: [birth year], 52 y.o.   MRN: [id number] Subjective/Complaints: Woke up with a little coughing and anxiety this am A 12 point review of systems has been performed and if not noted above is otherwise negative.   Objective: Vital Signs: Blood pressure 151/79, pulse 58, temperature 97.4 F (36.3 C), temperature source Oral, resp. rate 20, weight 159.8 kg (352 lb 4.7 oz), SpO2 97.00%. No results found. No results found for this basename: WBC, HGB, HCT, PLT,  in the last 72 hours No results found for this basename: NA, K, CL, CO, GLUCOSE, BUN, CREATININE, CALCIUM,  in the last 72 hours CBG (last 3)   Recent Labs  11/09/12 2113 11/10/12 0437 11/10/12 0706  GLUCAP 159* 114* 126*    Wt Readings from Last 3 Encounters:  11/07/12 159.8 kg (352 lb 4.7 oz)  10/21/12 149.7 kg (330 lb 0.5 oz)  10/21/12 149.7 kg (330 lb 0.5 oz)    Physical Exam:  Constitutional: He is oriented to person, place, and time. He appears well-developed and well-nourished.  Morbidly obese HENT:  Head: Normocephalic and atraumatic.  Mouth/Throat: Abnormal dentition. Dental caries present. Oropharyngeal exudate present.  Neck: Normal range of motion.  Phonation better. No air leakage from trach site. Cardiovascular: Normal rate and regular rhythm.  Pulmonary/Chest: Effort normal.   no wheezes a few rhonchi. No distress.  Abdominal: Soft. Bowel sounds are normal. He exhibits no distension. There is no tenderness.  Musculoskeletal: He exhibits edema (1+ pedal edema. LLE and 1+ RLE). .  Neurological: He is alert and oriented to person, place, and time.  Dysphonic,  . Right facial droop,lower facial and frontal droop, unable to close right eyelid. difficulty at times controlling secretions. Impulsive. Some improvement insight and awareness.  Able to follow basic commands without difficulty. BUE with weakness and decrease in fine motor movements right greater than left. Strength  grossly   4- to 4 in UE,  3 to 4 with more proximal weakness in  LE's. Good sitting balance. No changes in neuro exam Skin: Skin is warm and dry.   color improved in both legs. No redness or warmth.  Psych: more relaxed   Assessment/Plan: 1. 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 Physiatrist is providing close team supervision and 24 hour management of active medical problems listed below. Physiatrist and rehab team continue to assess barriers to discharge/monitor patient progress toward functional and medical goals.          FIM: FIM - Bathing Bathing Steps Patient Completed: Chest;Right Arm;Left Arm;Abdomen;Front perineal area;Buttocks;Right upper leg;Left upper leg Bathing: 4: Min-Patient completes 8-9 45f 10 parts or 75+ percent  FIM - Upper Body Dressing/Undressing Upper body dressing/undressing steps patient completed: Thread/unthread right sleeve of front closure shirt/dress;Pull shirt around back of front closure shirt/dress;Thread/unthread left sleeve of front closure shirt/dress;Button/unbutton shirt Upper body dressing/undressing: 5: Supervision: Safety issues/verbal cues FIM - Lower Body Dressing/Undressing Lower body dressing/undressing steps patient completed: Thread/unthread left pants leg;Pull pants up/down Lower body dressing/undressing: 3: Mod-Patient completed 50-74% of tasks  FIM - Toileting Toileting steps completed by patient: Adjust clothing prior to toileting;Adjust clothing after toileting Toileting Assistive Devices: Grab bar or rail for support Toileting: 3: Mod-Patient completed 2 of 3 steps  FIM - Radio producer Devices: Grab bars Toilet Transfers: 5-To toilet/BSC: Supervision (  verbal cues/safety  issues);5-From toilet/BSC: Supervision (verbal cues/safety issues)  FIM - Engineer, site Assistive Devices: Walker;HOB elevated;Bed rails Bed/Chair Transfer: 5: Chair or W/C > Bed: Supervision (verbal cues/safety issues);5: Sit > Supine: Supervision (verbal cues/safety issues)  FIM - Locomotion: Wheelchair Locomotion: Wheelchair: 6: Travels 150 ft or more, turns around, maneuvers to table, bed or toilet, negotiates 3% grade: maneuvers on rugs and over door sills independently FIM - Locomotion: Ambulation Locomotion: Ambulation Assistive Devices: Administrator Ambulation/Gait Assistance: 4: Min guard Locomotion: Ambulation: 4: Travels 150 ft or more with minimal assistance (Pt.>75%)  Comprehension Comprehension Mode: Auditory Comprehension: 5-Understands complex 90% of the time/Cues < 10% of the time  Expression Expression Mode: Verbal Expression Assistive Devices: 6-Other (Comment) Expression: 4-Expresses basic 75 - 89% of the time/requires cueing 10 - 24% of the time. Needs helper to occlude trach/needs to repeat words.  Social Interaction Social Interaction: 5-Interacts appropriately 90% of the time - Needs monitoring or encouragement for participation or interaction.  Problem Solving Problem Solving Mode: Not assessed Problem Solving: 4-Solves basic 75 - 89% of the time/requires cueing 10 - 24% of the time  Memory Memory Mode: Not assessed Memory: 4-Recognizes or recalls 75 - 89% of the time/requires cueing 10 - 24% of the time  Medical Problem List and Plan:  1. DVT Prophylaxis/Anticoagulation: Mechanical: Antiembolism stockings, knee (TED hose) Bilateral lower extremities  Sequential compression devices, below knee Bilateral lower extremities.  Add lovenox.  Will check dopplers.  2. Pain Management: N/A.   prn ultram.   -wean prednisone. Continue colchicine  -appearance of feet and pain much better 3. Mood: Provide ego support and  redirection.  -needs regular reassurance, explanations, and egosupport  4. Neuropsych: This patient is capable of making decisions on his/her own behalf.  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 Continue Lacrilube tid with patching at nights to prevent corneal abrasion. Eye patch at night. ?oculoplastics for gold pellet in eyelid as outpt 6. CAD: continue atenolol bid. Patient has declined statin per reports.  7. DM type 2- diet controlled: Will monitor BS with AC/HS CBG checks. Use SSI for elevated BS. Currently 120-200 range likely due to stress and stress dose steroids. Fair control at present 8. Chronic LE edema: Low salt diet. Added TEDs. Elevate legs 9. Leukocytosis: improving  -ENT closed stoma at bedside--sutures out this week  -dc'ed abx.  -secretions low. Voice MUCH improved  -needs to still occlude stoma when speaking       8. HTN: Monitor with bid checks. Recently more elevated.  Prednisone not helping.   -add low dose ACE for now  -continue tenormin   9. Acute renal insufficiency: resolved  LOS (Days) 19 A FACE TO FACE EVALUATION WAS PERFORMED  Zaiah Eckerson T 11/10/2012 8:43 AM

## 2012-11-11 ENCOUNTER — Inpatient Hospital Stay (HOSPITAL_COMMUNITY): Payer: BC Managed Care – PPO | Admitting: Physical Therapy

## 2012-11-11 ENCOUNTER — Inpatient Hospital Stay (HOSPITAL_COMMUNITY): Payer: BC Managed Care – PPO

## 2012-11-11 ENCOUNTER — Inpatient Hospital Stay (HOSPITAL_COMMUNITY): Payer: BC Managed Care – PPO | Admitting: Speech Pathology

## 2012-11-11 LAB — GLUCOSE, CAPILLARY
Glucose-Capillary: 119 mg/dL — ABNORMAL HIGH (ref 70–99)
Glucose-Capillary: 134 mg/dL — ABNORMAL HIGH (ref 70–99)

## 2012-11-11 MED ORDER — OXYCODONE HCL 5 MG PO TABS
10.0000 mg | ORAL_TABLET | ORAL | Status: DC | PRN
  Administered 2012-11-11 (×2): 10 mg via ORAL
  Filled 2012-11-11 (×2): qty 2

## 2012-11-11 MED ORDER — HYDROCODONE-ACETAMINOPHEN 5-325 MG PO TABS
1.0000 | ORAL_TABLET | Freq: Once | ORAL | Status: AC
  Administered 2012-11-11: 1 via ORAL
  Filled 2012-11-11: qty 1

## 2012-11-11 NOTE — Progress Notes (Signed)
Patient ID: [name], male   DOB: Dec 29, 1960, 52 y.o.   MRN: [id number] Subjective/Complaints: "I've had a headache since 3pm yesterday". Pain over right shoulder and right side of head-near incision  A 12 point review of systems has been performed and if not noted above is otherwise negative.   Objective: Vital Signs: Blood pressure 178/80, pulse 68, temperature 98.9 F (37.2 C), temperature source Oral, resp. rate 18, weight 159.8 kg (352 lb 4.7 oz), SpO2 90.00%. No results found. No results found for this basename: WBC, HGB, HCT, PLT,  in the last 72 hours No results found for this basename: NA, K, CL, CO, GLUCOSE, BUN, CREATININE, CALCIUM,  in the last 72 hours CBG (last 3)   Recent Labs  11/10/12 1631 11/10/12 2242 11/11/12 0713  GLUCAP 267* 137* 119*    Wt Readings from Last 3 Encounters:  11/07/12 159.8 kg (352 lb 4.7 oz)  10/21/12 149.7 kg (330 lb 0.5 oz)  10/21/12 149.7 kg (330 lb 0.5 oz)    Physical Exam:  Constitutional: He is oriented to person, place, and time. He appears well-developed and well-nourished.  Morbidly obese HENT:  Head: Normocephalic and atraumatic.  Mouth/Throat: Abnormal dentition. Dental caries present. Oropharyngeal exudate present.  Neck: Normal range of motion.  Phonation better. Still occasionaly air leakage from trach site with speech. Cardiovascular: Normal rate and regular rhythm.  Pulmonary/Chest: Effort normal.   no wheezes a few rhonchi. No distress.  Abdominal: Soft. Bowel sounds are normal. He exhibits no distension. There is no tenderness.  Musculoskeletal: He exhibits edema (1+ pedal edema. LLE and 1+ RLE). .  Neurological: He is alert and oriented to person, place, and time.  Dysphonic,  . Right facial droop,lower facial and frontal droop, unable to close right eyelid. Still some difficulty controlling secretions. Impulsive. Some improvement insight and awareness.  Able to follow basic commands without difficulty. BUE with  weakness and decrease in fine motor movements right greater than left. Strength grossly   4- to 4 in UE,  3 to 4 with more proximal weakness in  LE's. Good sitting balance. Skin: Skin is warm and dry.   color improved in both legs. No redness or warmth.  Psych: more relaxed   Assessment/Plan: 1. 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 Physiatrist is providing close team supervision and 24 hour management of active medical problems listed below. Physiatrist and rehab team continue to assess barriers to discharge/monitor patient progress toward functional and medical goals.          FIM: FIM - Bathing Bathing Steps Patient Completed: Chest;Right Arm;Left Arm;Abdomen;Front perineal area;Buttocks;Right upper leg;Left upper leg Bathing: 4: Min-Patient completes 8-9 44f 10 parts or 75+ percent  FIM - Upper Body Dressing/Undressing Upper body dressing/undressing steps patient completed: Thread/unthread right sleeve of front closure shirt/dress;Pull shirt around back of front closure shirt/dress;Thread/unthread left sleeve of front closure shirt/dress;Button/unbutton shirt Upper body dressing/undressing: 5: Supervision: Safety issues/verbal cues FIM - Lower Body Dressing/Undressing Lower body dressing/undressing steps patient completed: Thread/unthread left pants leg;Pull pants up/down Lower body dressing/undressing: 3: Mod-Patient completed 50-74% of tasks  FIM - Toileting Toileting steps completed by patient: Adjust clothing prior to toileting;Adjust clothing after toileting Toileting Assistive Devices: Grab bar or rail for support Toileting: 3: Mod-Patient completed 2 of 3 steps  FIM - Radio producer Devices: Grab  bars Toilet Transfers: 5-To  toilet/BSC: Supervision (verbal cues/safety issues);5-From toilet/BSC: Supervision (verbal cues/safety issues)  FIM - Engineer, site Assistive Devices: Walker;HOB elevated;Bed rails Bed/Chair Transfer: 5: Chair or W/C > Bed: Supervision (verbal cues/safety issues);5: Sit > Supine: Supervision (verbal cues/safety issues)  FIM - Locomotion: Wheelchair Locomotion: Wheelchair: 6: Travels 150 ft or more, turns around, maneuvers to table, bed or toilet, negotiates 3% grade: maneuvers on rugs and over door sills independently FIM - Locomotion: Ambulation Locomotion: Ambulation Assistive Devices: Administrator Ambulation/Gait Assistance: 4: Min guard Locomotion: Ambulation: 4: Travels 150 ft or more with minimal assistance (Pt.>75%)  Comprehension Comprehension Mode: Auditory Comprehension: 5-Understands complex 90% of the time/Cues < 10% of the time  Expression Expression Mode: Verbal Expression Assistive Devices: 6-Other (Comment) Expression: 5-Expresses complex 90% of the time/cues < 10% of the time  Social Interaction Social Interaction: 5-Interacts appropriately 90% of the time - Needs monitoring or encouragement for participation or interaction.  Problem Solving Problem Solving Mode: Not assessed Problem Solving: 5-Solves complex 90% of the time/cues < 10% of the time  Memory Memory Mode: Not assessed Memory: 5-Recognizes or recalls 90% of the time/requires cueing < 10% of the time  Medical Problem List and Plan:  1. DVT Prophylaxis/Anticoagulation: Mechanical: Antiembolism stockings, knee (TED hose) Bilateral lower extremities  Sequential compression devices, below knee Bilateral lower extremities.  Add lovenox.  Will check dopplers.  2. Pain Management: N/A.   Will try prn oxy ir for headache  -wean prednisone for gout. Continue colchicine    3. Mood: Provide ego support and redirection. Has anxiety attacks  -needs regular reassurance, explanations,  and egosupport  4. Neuropsych: This patient is capable of making decisions on his/her own behalf.  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 Continue Lacrilube tid with patching at nights to prevent corneal abrasion. Eye patch at night. ?oculoplastics for gold pellet in eyelid as outpt 6. CAD: continue atenolol bid. Patient has declined statin per reports.  7. DM type 2- diet controlled: Will monitor BS with AC/HS CBG checks. Use SSI for elevated BS. Currently 120-200 range likely due to stress and stress dose steroids. Fair control at present 8. Chronic LE edema: Low salt diet. Added TEDs. Elevate legs 9. Leukocytosis: improving  -ENT closed stoma at bedside--sutures out this week  -dc'ed abx.  -secretions low. Voice MUCH improved  -needs to still occlude stoma when speaking       8. HTN: Monitor with bid checks. Recently more elevated.  Prednisone not helping bp.   -added low dose ACE yesterday  -continue tenormin   9. Acute renal insufficiency: resolved  LOS (Days) 20 A FACE TO FACE EVALUATION WAS PERFORMED  Remmie Bembenek T 11/11/2012 8:13 AM

## 2012-11-11 NOTE — Progress Notes (Signed)
Social Work Patient ID: [name], male   DOB: 1961-03-22, 52 y.o.   MRN: [id number]  Contacted pt's mother this afternoon to confirm that pt's wife would be here tomorrow morning at 1:00 pm for education prior to pt's d/c.  Stressed to pt's mother that I had scheduled with wife last week for her to be here at 1:00 on Friday, however, wife was a "no show".  Mother initially said she would arrange for their neighbor to pick pt up, however, I explained that we MUST do family education with wife prior to this.  She said she will tell wife she must be here at 1:00 tomorrow.  I have also informed pt of all of this and he is agreeable.  Arranging for [facility] f/u to start.  Jin Capote, LCSW

## 2012-11-11 NOTE — Progress Notes (Signed)
Occupational Therapy Session Note  Patient Details  Name: [name] MRN: [id number] Date of Birth: 18-Nov-1960  Today's Date: 11/11/2012 Time: 0900-1000 Time Calculation (min): 60 min  Short Term Goals: Week 3:  OT Short Term Goal 1 (Week 3): Pt will perform toileting tasks at mod I OT Short Term Goal 2 (Week 3): Pt will complete bathing with supervision using AE prn OT Short Term Goal 3 (Week 3): Pt will perform LB dressing with min A.  Skilled Therapeutic Interventions/Progress Updates:    Pt engaged in bathing and dressing tasks with st<>stand from w/c at sink.  Pt required assistance with bathing feet and donning socks.  Pt bathed front perineal area and buttocks while standing.  Pt O2 sats>90% on RA throughout session.  Pt amb with RW into bathroom to use toilet.  Pt performed all tasks at supervision level. Focus on activity tolerance, dynamic standing balance, and safety awareness.  Therapy Documentation Precautions:  Precautions Precautions: Fall Precaution Comments: dizziness with mobility, deaf R ear, blind R eye, double vision, open stoma Restrictions Weight Bearing Restrictions: No Pain: Pain Assessment Pain Assessment: 0-10 Pain Score:   6 Pain Type: Acute pain Pain Location: Head Pain Orientation: Right Pain Descriptors: Headache Pain Onset: On-going Patients Stated Pain Goal: 2 Pain Intervention(s): RN made aware Multiple Pain Sites: No  See FIM for current functional status  Therapy/Group: Individual Therapy  Leroy Libman 11/11/2012, 10:41 AM

## 2012-11-11 NOTE — Progress Notes (Signed)
Physical Therapy Session Note  Patient Details  Name: [name] MRN: [id number] Date of Birth: March 29, 1961  Today's Date: 11/11/2012 Time: 0830-0900 Time Calculation (min): 30 min  Short Term Goals: Week 2:  PT Short Term Goal 1 (Week 2): Pt will ambulate 45' with supervision and RW PT Short Term Goal 1 - Progress (Week 2): Met PT Short Term Goal 2 (Week 2): Pt will perform vestibular exercises with only supervision cues needed PT Short Term Goal 2 - Progress (Week 2): Met PT Short Term Goal 3 (Week 2): Pt will transfer wheelchair <> mat with modified independence PT Short Term Goal 3 - Progress (Week 2): Not met  Skilled Therapeutic Interventions/Progress Updates:    Rollator walker present however pt reports he does not use this one as the brakes don't work well. Pt prefers bariatric walker for now, recommend pt work on rollator walker with HHPT.   Bathroom mobility with RW, close supervision. Ambulation over tile and carpeted surface x 120' with RW, close supervision and cues for proximity of RW. Practiced back stepping with close supervision, pt initially allowing RW to get to far away from self however improved with cues and practice.   SpO2 >93% on room air during session.   Therapy Documentation Precautions:  Precautions Precautions: Fall Precaution Comments: dizziness with mobility, deaf R ear, blind R eye, double vision, open stoma Restrictions Weight Bearing Restrictions: No Pain: Pain Assessment Pain Assessment: 0-10 Pain Score:   6 Pain Type: Acute pain Pain Location: Head Pain Orientation: Right Pain Descriptors: Headache Pain Onset: On-going Patients Stated Pain Goal: 2 Pain Intervention(s): RN made aware Multiple Pain Sites: No  See FIM for current functional status  Therapy/Group: Individual Therapy  Lahoma Rocker 11/11/2012, 12:18 PM

## 2012-11-11 NOTE — Progress Notes (Signed)
Physical Therapy Session Note  Patient Details  Name: [name] MRN: [id number] Date of Birth: 20-Sep-1960  Today's Date: 11/11/2012 Time: 1300-1400 Time Calculation (min): 60 min  Short Term Goals: Week 2:  PT Short Term Goal 1 (Week 2): Pt will ambulate 60' with supervision and RW PT Short Term Goal 1 - Progress (Week 2): Met PT Short Term Goal 2 (Week 2): Pt will perform vestibular exercises with only supervision cues needed PT Short Term Goal 2 - Progress (Week 2): Met PT Short Term Goal 3 (Week 2): Pt will transfer wheelchair <> mat with modified independence PT Short Term Goal 3 - Progress (Week 2): Not met  Skilled Therapeutic Interventions/Progress Updates:    Home environment ambulation over carpeted and tile surface with RW working on forwards, sidestepping, and back stepping with close supervision. Practiced sit <> stands from variety of surfaces (bed, low couch, and low chair without arm rests) with supervision and cues for safety. Practiced reaching into cabinet, cues for holding onto cabinet for stability. Pt still tends to utilize RW in unsafe manner and will need supervision at home.   Practiced curb and ramp ascent/descent x 3 reps with RW, close supervision as he had decreased carryover for safely placing RW down curb step (tends to have wheels down and uprights still on step) and tends to allow RW to get too far ahead of self. Pt reports a lot of his difficulty with mobility is coming from his impaired vision, due to this he will need supervision with standing mobility at home.   Therapy Documentation Precautions:  Precautions Precautions: Fall Precaution Comments: dizziness with mobility, deaf R ear, blind R eye, double vision, open stoma Restrictions Weight Bearing Restrictions: No Pain: Pain Assessment Pain Assessment: 0-10 Pain Score:   2 Pain Type: Acute pain Pain Location: Head Pain Orientation: Right Pain Descriptors: Aching Pain Frequency:  Intermittent Pain Intervention(s): declined medication, rested as needed See FIM for current functional status  Therapy/Group: Individual Therapy  Lahoma Rocker 11/11/2012, 3:45 PM

## 2012-11-11 NOTE — Progress Notes (Addendum)
Speech Language Pathology Daily Session Note  Patient Details  Name: [name] MRN: [id number] Date of Birth: 1961-03-08  Today's Date: 11/11/2012 Time: 1015-1100 Time Calculation (min): 45 min  Short Term Goals: Week 3: SLP Short Term Goal 1 (Week 3): Pt will utilize memory compensatory strategies to increase recall/carryover of newly learned information with Min A question cues.  SLP Short Term Goal 2 (Week 3): Pt will demonstrate alternating attention between two tasks for 30 minutes with Mod A vebal cues for redirection. SLP Short Term Goal 3 (Week 3): Pt will utilize swallowing compensatory strategies with Mod I. SLP Short Term Goal 4 (Week 3): Pt will apply pressure to stoma during verbal expression to increase vocal intensity with supervision verbal cues.  SLP Short Term Goal 5 (Week 3): Pt will consume trials of Dys. 2 textures and demonstrate efficient mastication with 100% of trials with Mod A verbal cues.   Skilled Therapeutic Interventions: Treatment focus on dysarthria goals. SLP facilitated session by providing Min A verbal and visual cues for pt to apply pressure to stoma at the sentence level to increase vocal intensity for functional communication. NMES was also utilized and electrodes were placed lateral to right buccinator muscle and slightly posterior and superior to initial electrode (4a position). He tolerated up to 9.5 mA during volitional oral-motor exercises for labial ROM. Pt also reported he is "very messy" when he eats, pt re-educated on compensatory strategies to utilize to decrease right anterior spillage while consuming Dys. 1 textures and thin liquids.  Pt verbalized understanding.    FIM:  Comprehension Comprehension Mode: Auditory Comprehension: 5-Understands complex 90% of the time/Cues < 10% of the time Expression Expression Mode: Verbal Expression: 4-Expresses basic 75 - 89% of the time/requires cueing 10 - 24% of the time. Needs helper to occlude  trach/needs to repeat words. Social Interaction Social Interaction: 5-Interacts appropriately 90% of the time - Needs monitoring or encouragement for participation or interaction. Problem Solving Problem Solving: 5-Solves basic 90% of the time/requires cueing < 10% of the time Memory Memory: 5-Recognizes or recalls 90% of the time/requires cueing < 10% of the time FIM - Eating Eating Activity: 0: Activity did not occur  Pain Pain Assessment Pain Assessment: 0-10 Pain Score:   6 Pain Type: Acute pain Pain Location: Head Pain Orientation: Right Pain Descriptors: Headache Pain Onset: On-going Patients Stated Pain Goal: 2 Pain Intervention(s): RN made aware Multiple Pain Sites: No  Therapy/Group: Individual Therapy  Jisell Majer 11/11/2012, 11:46 AM

## 2012-11-11 NOTE — Progress Notes (Signed)
Patient complaining of increase headache and refused oxycodone . Patient reports oxycodone is not effective and requests to continue vicodin . Continue with plan of care .  Mliss Sax

## 2012-11-12 ENCOUNTER — Ambulatory Visit (HOSPITAL_COMMUNITY): Payer: BC Managed Care – PPO | Admitting: Physical Therapy

## 2012-11-12 ENCOUNTER — Encounter (HOSPITAL_COMMUNITY): Payer: BC Managed Care – PPO

## 2012-11-12 DIAGNOSIS — N289 Disorder of kidney and ureter, unspecified: Secondary | ICD-10-CM | POA: Diagnosis present

## 2012-11-12 LAB — GLUCOSE, CAPILLARY: Glucose-Capillary: 115 mg/dL — ABNORMAL HIGH (ref 70–99)

## 2012-11-12 MED ORDER — PREDNISONE 20 MG PO TABS: 20.0000 mg | ORAL_TABLET | Freq: Every day | ORAL | Status: DC

## 2012-11-12 MED ORDER — LISINOPRIL 10 MG PO TABS: 10.0000 mg | ORAL_TABLET | Freq: Every day | ORAL | Status: DC

## 2012-11-12 MED ORDER — HYDROCODONE-ACETAMINOPHEN 10-325 MG PO TABS
1.0000 | ORAL_TABLET | Freq: Four times a day (QID) | ORAL | Status: DC | PRN
  Administered 2012-11-12 (×2): 1 via ORAL
  Filled 2012-11-12 (×2): qty 1

## 2012-11-12 MED ORDER — PREDNISONE 20 MG PO TABS
20.0000 mg | ORAL_TABLET | Freq: Every day | ORAL | Status: DC
  Administered 2012-11-12: 20 mg via ORAL
  Filled 2012-11-12 (×2): qty 1

## 2012-11-12 MED ORDER — BENZOCAINE 10 % MT GEL: Freq: Three times a day (TID) | OROMUCOSAL | Status: DC

## 2012-11-12 MED ORDER — HYDROCERIN EX CREA: 1.0000 "application " | TOPICAL_CREAM | Freq: Two times a day (BID) | CUTANEOUS | Status: DC

## 2012-11-12 MED ORDER — ADULT MULTIVITAMIN W/MINERALS CH: 1.0000 | ORAL_TABLET | Freq: Every day | ORAL | Status: DC

## 2012-11-12 MED ORDER — TRAZODONE HCL 50 MG PO TABS: 50.0000 mg | ORAL_TABLET | Freq: Every day | ORAL | Status: DC

## 2012-11-12 MED ORDER — BACITRACIN-NEOMYCIN-POLYMYXIN OINTMENT TUBE: 1.0000 "application " | TOPICAL_OINTMENT | Freq: Three times a day (TID) | CUTANEOUS | Status: DC

## 2012-11-12 MED ORDER — SENNOSIDES-DOCUSATE SODIUM 8.6-50 MG PO TABS: 2.0000 | ORAL_TABLET | Freq: Two times a day (BID) | ORAL | Status: DC

## 2012-11-12 MED ORDER — ALBUTEROL SULFATE HFA 108 (90 BASE) MCG/ACT IN AERS
2.0000 | INHALATION_SPRAY | Freq: Four times a day (QID) | RESPIRATORY_TRACT | Status: DC | PRN
Start: 2012-11-12 — End: 2012-11-12
  Filled 2012-11-12: qty 6.7

## 2012-11-12 MED ORDER — GUAIFENESIN ER 600 MG PO TB12: 1200.0000 mg | ORAL_TABLET | Freq: Two times a day (BID) | ORAL | Status: DC

## 2012-11-12 MED ORDER — GLYCOPYRROLATE 1 MG PO TABS: 1.0000 mg | ORAL_TABLET | Freq: Two times a day (BID) | ORAL | Status: DC

## 2012-11-12 MED ORDER — ATENOLOL 50 MG PO TABS: 50.0000 mg | ORAL_TABLET | Freq: Two times a day (BID) | ORAL | Status: DC

## 2012-11-12 MED ORDER — COLCHICINE 0.6 MG PO TABS
0.6000 mg | ORAL_TABLET | Freq: Every day | ORAL | Status: DC
  Administered 2012-11-12: 0.6 mg via ORAL
  Filled 2012-11-12 (×2): qty 1

## 2012-11-12 MED ORDER — ALBUTEROL SULFATE HFA 108 (90 BASE) MCG/ACT IN AERS: 2.0000 | INHALATION_SPRAY | Freq: Four times a day (QID) | RESPIRATORY_TRACT | Status: DC | PRN

## 2012-11-12 MED ORDER — ARTIFICIAL TEARS OP OINT: TOPICAL_OINTMENT | Freq: Three times a day (TID) | OPHTHALMIC | Status: DC

## 2012-11-12 MED ORDER — BIOTENE DRY MOUTH MT LIQD: 15.0000 mL | Freq: Four times a day (QID) | OROMUCOSAL | Status: DC

## 2012-11-12 MED ORDER — TAMSULOSIN HCL 0.4 MG PO CAPS: 0.4000 mg | ORAL_CAPSULE | Freq: Every day | ORAL | Status: DC

## 2012-11-12 MED ORDER — COLCHICINE 0.6 MG PO TABS: 0.6000 mg | ORAL_TABLET | Freq: Every day | ORAL | Status: DC

## 2012-11-12 MED ORDER — HYDROCODONE-ACETAMINOPHEN 10-325 MG PO TABS: 1.0000 | ORAL_TABLET | Freq: Four times a day (QID) | ORAL | Status: DC | PRN

## 2012-11-12 NOTE — Progress Notes (Addendum)
Occupational Therapy Note  Patient Details  Name: [name] MRN: [id number] Date of Birth: Sep 09, 1960 Today's Date: 11/12/2012  Time: [id number] Pt denies pain Individual therapy  Pt's wife present for education with BADLs, transfers, and safety at home.  Pt verbalized and demonstrated understanding and return demonstrated transfers.  Applied kinesio tape to neck area of patient, instructed wife, and left several strips of kinesio tape to use at home.  Pt and wife pleased with progress.  Wife with no further questions.  Pt's wife stated shower at home was more like a tub than shower although patient had previously stated he had a walk in shower.  Recommended to patient and wife to continue with sponge baths until home health therapists cleared patient to step into shower at home.  Wife verbalized understanding.   [name] [facility] 11/12/2012, 2:24 PM

## 2012-11-12 NOTE — Progress Notes (Signed)
Speech Language Pathology Session Note & Discharge Summary  Patient Details  Name: [name] MRN: [id number] Date of Birth: 06-Feb-1961  Today's Date: 11/12/2012 Time: [id number] Time Calculation (min): 15 min  Skilled Therapeutic Intervention: Treatment focus on family education.  Pt's wife educated on pt's current cognitive and swallowing function. Pt's wife also educated strategies to utilize at home to increase his overall safety awareness and swallowing compensatory strategies to decrease aspiration risk and reduce right anterior spillage. Pt's wife verbalized understanding.   Patient has met 7 of 7 long term goals.  Patient to discharge at [facility] level.   Reasons goals not met: N/A   Clinical Impression/Discharge Summary: Pt has made functional gains and has met 7 of 7 LTG's this admission. Currently, pt is consuming Dys. 1 textures and thin liquids without overt s/s of aspiration and requires intermittent supervision for utilization of small bites and proper placement of bolus on the left side of his oral cavity.  Recommend pt upgrade to Dys. 2 textures, however, pt refused upgraded textures due to fear. Pt also demonstrates and increased vocal intensity and is ~90% intelligible at the phrase level for expression of wants/needs.  Pt also requires overall min A verbal cues for safety awareness, recall of newly learned information, alternating attention and emergent awareness. Pt's overall cognitive function is also impacted by his anxiety. Pt/family education completed. Recommend pt receive f/u home health skilled SLP intervention to maximize cognitive function, functional communication and swallowing function with least restrictive diet. Pt also requested continued use of NMES to increase pt's overall right labial ROM and strength.   Care Partner:  Caregiver Able to Provide Assistance: Yes  Type of Caregiver Assistance: Physical;Cognitive  Recommendation:  Home Health SLP;24  hour supervision/assistance  Rationale for SLP Follow Up: Maximize cognitive function and independence;Maximize functional communication;Maximize swallowing safety;Reduce caregiver burden   Equipment: N/A   Reasons for discharge: Treatment goals met;Discharged from hospital   Patient/Family Agrees with Progress Made and Goals Achieved: Yes   See FIM for current functional status  Lionell Matuszak 11/12/2012, 3:31 PM  [place], [place], [place]

## 2012-11-12 NOTE — Progress Notes (Signed)
Patient discharge to home with wife and all belongings at 70. Anterior neck old stoma site approximated with dermabond intact . Educated patient and wife on signs and symptoms of infection. Discharge instructions given and reviewed by P. Love , PA . Patient and wife verbalized understanding of discharge instructions. o2 sat at 95% room air . vicodin 1 po given at prior  To discharge . Continue with plan of care .                  [name]

## 2012-11-12 NOTE — Progress Notes (Signed)
Social Work  Discharge Note  The overall goal for the admission was met for:   Discharge location: Yes - home with wife and mother to assist  Length of Stay: Yes - 21 days  Discharge activity level: Yes - supervision overall  Home/community participation: Yes  Services provided included: MD, RD, PT, OT, SLP, RN, TR, Pharmacy and SW  Financial Services: Private Insurance: [employer] broker  Follow-up services arranged: Home Health: RN, PT, OT, ST via [place], DME: bariatric rolling walker via [place] and Patient/Family has no preference for HH/DME agencies  Comments (or additional information):  Patient/Family verbalized understanding of follow-up arrangements: Yes  Individual responsible for coordination of the follow-up plan: patient  Confirmed correct DME delivered: Hairo Garraway 11/12/2012    Mahnoor Mathisen

## 2012-11-12 NOTE — Progress Notes (Signed)
Physical Therapy Discharge Summary  Patient Details  Name: [name] MRN: [id number] Date of Birth: [birth year]  Today's Date: 11/12/2012 Time: [id number] Time Calculation (min): 45 min  Patient has met 6 of 8 long term goals due to improved activity tolerance, improved balance, increased strength, decreased pain, ability to compensate for deficits, improved attention and improved awareness.  Patient to discharge at an ambulatory level Supervision.   Patient's care partner is independent to provide the necessary physical and cognitive assistance at discharge.  Reasons goals not met: Pt did not meet two goals to be modified independent with transfers and gait in home environment secondary to decreased safety awareness and continued need for supervision with these activities. Wife aware and between her and pt's mother 24 hour supervision available.   Recommendation:  Patient will benefit from ongoing skilled PT services in home health setting to continue to advance safe functional mobility, address ongoing impairments in functional muscular endurance, cardiorespiratory endurance, balance, safety awareness and minimize fall risk. Pt will also benefit from long term community fitness plan.   Equipment: Bariatric RW  Reasons for discharge: discharge from hospital  Patient/family agrees with progress made and goals achieved: Yes  PT Discharge Precautions/Restrictions  Fall Vital Signs Therapy Vitals Patient Position, if appropriate: Sitting Oxygen Therapy SpO2: 90 % (at lowest post ambulation) O2 Device: None (Room air) Pain Pain Assessment Pain Assessment: 0-10 Pain Score:   6 Pain Type: Acute pain Pain Location: Head Pain Orientation: Right Pain Descriptors: Aching;Headache Pain Frequency: Constant Pain Onset: On-going Patients Stated Pain Goal: 2 Pain Intervention(s): Medication (See eMAR) (vicodin 1 po) Vision/Perception    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 Pt has c/o blurry vision and vision in multiples.   Cognition Orientation Level: Oriented X4 Sustained Attention: Impaired Memory: Impaired Problem Solving: Impaired Safety/Judgment: Impaired Sensation  Decreased slightly on Lt. Side (Lt. UE/Lt. LE) per pt report.   Mobility Bed Mobility Supine to Sit: 6: Modified independent (Device/Increase time) Transfers Sit to Stand: 5: Supervision Sit to Stand Details (indicate cue type and reason): Sometimes pt still needs verbal cues for safe upper extremity placement Stand to Sit: 5: Supervision Stand Pivot Transfers: 5: Supervision Locomotion  Ambulation Ambulation/Gait Assistance: 5: Supervision Ambulation Distance (Feet): 160 Feet Assistive device: Rolling walker Ambulation/Gait Assistance Details: Cues for upright posture and safe distancing of RW.  Gait Gait: Yes Gait Pattern: Trunk flexed Stairs / Additional Locomotion Ramp: 5: Supervision Curb: 5: Psychiatric nurse: Yes Wheelchair Assistance: 6: Modified independent (Device/Increase time) Environmental health practitioner: Both upper extremities Distance: 150'   Geneticist, molecular Sitting Balance Static Sitting - Balance Support: No upper extremity supported Static Sitting - Level of Assistance: 6: Modified independent (Device/Increase time) Dynamic Sitting Balance Dynamic Sitting - Level of Assistance: 6: Modified independent (Device/Increase time) Extremity Assessment      RLE Assessment RLE Assessment: Exceptions to [facility] RLE Strength RLE Overall Strength Comments: grossly 4-5; decreased muscular endurance LLE Assessment LLE Assessment: Exceptions to [facility] LLE Strength LLE Overall Strength Comments: grossly 4-/5; decreased muscular endurance  See FIM for current functional status  Lahoma Rocker 11/12/2012, 2:36 PM

## 2012-11-12 NOTE — Discharge Summary (Signed)
Physician Discharge Summary  Patient ID: [name] MRN: [id number] DOB/AGE: Sep 10, 1960 52 y.o.  Admit date: 10/22/2012 Discharge date: 11/12/2012  Discharge Diagnoses:  Principal Problem:   Acoustic neuroma Active Problems:   Morbid obesity   Serratia infection   Facial nerve palsy   Leucocytosis   Impaired fasting glucose   Purulent bronchitis   Gout flare   Acute renal insufficiency   Discharged Condition: Good.   Significant Diagnostic Studies: Dg Chest 2 View  10/28/2012  *RADIOLOGY REPORT*  Clinical Data: Persistent secretions and cough, history coronary artery disease, ischemic cardiomyopathy, CHF, hypertension, diabetes, MI  CHEST - 2 VIEW  Comparison: 10/26/2012  Findings: Enlargement of cardiac silhouette post median sternotomy. Mediastinal contours normal. Slight pulmonary vascular congestion. Subsegmental atelectasis right middle lobe. Slight chronic accentuation of perihilar markings similar to previous exam. No definite acute failure or segmental consolidation. No pleural effusion or pneumothorax. Bones unremarkable.  IMPRESSION: Subsegmental atelectasis right middle lobe. Enlargement of cardiac silhouette with slight pulmonary vascular congestion.   Original Report Authenticated By: Lavonia Dana, M.D.    Ct Head Wo Contrast  11/07/2012  *RADIOLOGY REPORT*  Clinical Data: Status post right cerebellopontine acoustic neuroma resection.  Left sided numbness and tingling.  CT HEAD WITHOUT CONTRAST  Technique:  Contiguous axial images were obtained from the base of the skull through the vertex without contrast.  Comparison: Head CT 02/23/2014no brain MRI 08/12/2012  Findings: Stable low attenuation in the right cerebellar peduncle, in this patient status post resection of a right cerebellopontine angle mass.  The ventricles are normal in size.  Negative for hemorrhage, hydrocephalus, midline shift, or evidence of acute cortically based infarction.  The previously described  subcutaneous and subdural gas has resolved.  There are postoperative changes of the right rectosigmoid craniectomy.  There is some fluid and within the craniectomy site.  The imaged paranasal sinuses are clear. There is fluid in the right mastoid air cells.  IMPRESSION:  1.  No acute intracranial abnormality identified. Interval resolution of subcutaneous and subdural gas in the CT of 10/13/2012. 2.  Stable postoperative changes of resection of a right cerebellopontine angle mass. 3.  Right mastoid effusion.   Original Report Authenticated By: Curlene Dolphin, M.D.     Labs:  Basic Metabolic Panel:  Recent Labs Lab 11/06/12 0559  NA 135  K 4.3  CL 97  CO2 33*  GLUCOSE 101*  BUN 14  CREATININE 0.67  CALCIUM 9.3    CBC:  Recent Labs Lab 11/06/12 0559  WBC 9.1  HGB 12.5*  HCT 38.8*  MCV 94.2  PLT 215    CBG:  Recent Labs Lab 11/11/12 1130 11/11/12 1617 11/11/12 2110 11/12/12 0714 11/12/12 1154  GLUCAP 134* 130* 189* 115* 107*    HPI:   [name] is a 52 y.o. male with history of CAD, ICM, morbid obesity, who has had right sided hearing loss for the past year and two month history of difficulty using hands, hoarseness, urinary frequency as well as dizziness with balance problems requiring use of wheelchair. 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 He was admitted on 10/11/12 for acoustic neroma resection by Dr. Christella Noa and tracheostomy by Dr. Benjamine Mola. Post op required slow vent wean but tolerated ATC and was de-cannulated on 03/03. Oral secretions have decreased and FEES done revealed delay in swallow with ?aspiration/penetration with straws. Was started on D3, thin liquids. He continues to have right facial palsy, dizziness with activity and inability to  phonate. He needed cues for safety and gaze stabilization. Therapy team recommended CIR level therapies for progression. Pt was admitted 10/22/12 for inpatient rehab   Hospital Course:  [name] was admitted to rehab 10/22/2012 for inpatient therapies to consist of PT, ST and OT at least three hours five days a week. Past admission physiatrist, therapy team and rehab RN have worked together to provide customized collaborative inpatient rehab. Patient has high levels of anxiety requiring a lot of ego support as well as education due to impaired cognition. He was noted to have excessive amount of purulent drainage from trach stoma. He was started on cipro for treatment of serratia tracheobronchitis.  Due to continued leucocytosis and drainage he was changed to IV antibiotics with improvement. Pulmonary was contacted and recommended continuing antibiotics and surgery for stoma closure.   Dr. Benjamine Mola has been following along for input. Stoma was sutured at bedside on 10/30/12. He has had slow wound healing. Sutures were discontinued and incision was derma bonded prior to discharge on 03/25. Right facial nerve palsy was treated with E-stim. Lacrilube and patching was encouraged to prevent corneal drying. Diet downgraded to D1 due to right pocketing and food spillage.  Patient had worsening of BLE edema with erythema due to gout flare. He was started on prednisone with slow taper to continue past discharge. He is to continue on cholchicine. His symptoms have greatly improved. Patient has history of boderline diabetes and his blood sugars have been elevated due to steroids. BS have been monitored on ac/hs basis with SSI for used for elevated BS.  He has been educated on carb modified low salt diet. He's to follow up with primary MD for recheck BS.   Headaches have been treated with vicodin. Pain right shoulder has been treated with taping by OT.  Wife was instructed on taping/use past discharge.  Leucocytosis has resolved. Acute renal insufficiency has resolved with improvement and tolerance of po intake. Blood pressures have been monitored on bid checks and were trending upwards. Lisinopril was  added for better control.  He's to follow up with primary MD for further titration of medications.   During patient's stay in rehab weekly team conferences were held to monitor patient's progress, set goals and discuss barriers to discharge. Speech therapy has worked with patient on cognitive and swallowing function. Pt also demonstrates and increased vocal intensity and is ~90% intelligible at the phrase level for expression of wants/needs. Pt also requires overall min A verbal cues for safety awareness, recall of newly learned information, alternating attention and emergent awareness. Pt's overall cognitive function is also impacted by his anxiety.  Wife was educated on strategies to utilize at home to increase his overall safety awareness and swallowing compensatory strategies to decrease aspiration risk and reduce right anterior spillage.Occupational therapy has worked with patient on ADL tasks. Patient requires assistance with bathing feet and donning sock. He is at supervision level for all other bathing and dressing tasks.  Physical therapy has worked with patient on balance and safety with mobility. patient is at [place] level with cues for transfer and ambulation. He requires close supervision for stair navigation due to decreased carryover and poor safety.   Disposition: 01-Home or Self Care       Future Appointments Provider Department Dept Phone   12/17/2012 10:00 AM Meredith Staggers, MD [facility] [phone]       Medication List    STOP taking these medications  albuterol (5 MG/ML) 0.5% nebulizer solution  Commonly known as:  PROVENTIL     HYDROcodone-acetaminophen 5-500 MG per tablet  Commonly known as:  VICODIN      TAKE these medications       albuterol 108 (90 BASE) MCG/ACT inhaler  Commonly known as:  PROVENTIL HFA;VENTOLIN HFA  Inhale 2 puffs into the lungs every 6 (six) hours as needed for wheezing or shortness of breath.      antiseptic oral rinse Liqd  15 mLs by Mouth Rinse route QID.     artificial tears Oint ophthalmic ointment  Apply to eye every 8 (eight) hours.     atenolol 50 MG tablet  Commonly known as:  TENORMIN  Take 1 tablet (50 mg total) by mouth 2 (two) times daily. Note increased dose.     benzocaine 10 % mucosal gel  Commonly known as:  ORAJEL  Use as directed in the mouth or throat 4 (four) times daily - after meals and at bedtime.     chlorhexidine 0.12 % solution  Commonly known as:  PERIDEX  Use as directed 15 mLs in the mouth or throat 2 (two) times daily.     colchicine 0.6 MG tablet  Take 1 tablet (0.6 mg total) by mouth daily.     guaiFENesin 600 MG 12 hr tablet  Commonly known as:  MUCINEX  Take 2 tablets (1,200 mg total) by mouth 2 (two) times daily. To help loosen secretions.     hydrocerin Crea  Apply 1 application topically 2 (two) times daily.     HYDROcodone-acetaminophen 10-325 MG per tablet  Commonly known as:  NORCO  Take 1 tablet by mouth every 6 (six) hours as needed. For severe pain.     lisinopril 10 MG tablet  Commonly known as:  PRINIVIL,ZESTRIL  Take 1 tablet (10 mg total) by mouth daily. New medication for blood pressure control     multivitamin with minerals Tabs  Take 1 tablet by mouth daily.     neomycin-bacitracin-polymyxin Oint  Commonly known as:  NEOSPORIN  Apply 1 application topically 3 (three) times daily. To neck     predniSONE 20 MG tablet  Commonly known as:  DELTASONE  Take 1 tablet (20 mg total) by mouth daily with breakfast.     senna-docusate 8.6-50 MG per tablet  Commonly known as:  Senokot-S  Take 2 tablets by mouth 2 (two) times daily. For constipation.     tamsulosin 0.4 MG Caps  Commonly known as:  FLOMAX  Take 1 capsule (0.4 mg total) by mouth daily after supper. For bladder.     traZODone 50 MG tablet  Commonly known as:  DESYREL  Take 1 tablet (50 mg total) by mouth at bedtime. For insomnia.        Follow-up Information   Follow up with Meredith Staggers, MD On 12/17/2012. (BE there at 9:30 for 10 am appointment. )    Contact information:   [address], [place] [address] [phone]       Follow up with Ascencion Dike, MD. Call today. (for follow up appointment)    Contact information:   [address] [place] [address] [phone]       Follow up with CABBELL,KYLE L, MD. Call today. (for follow up appointment.)    Contact information:   [id number]  [place] [address] [phone]       Follow up with Leonard Downing, MD On 11/15/2012. (Appointment at  2:00 pm)    Contact information:   [place] [address] [phone]       Follow up with Solon Augusta, MD On 11/18/2012. (appointment at 8 am.  )    Contact information:   [facility], [place]      Signed: Bary Leriche 11/12/2012, 4:35 PM  CC:  Dr. Ashok Pall         Dr. Nicola Police         Dr. Danley Danker         Dr. Isidoro Donning

## 2012-11-12 NOTE — Progress Notes (Signed)
Patient ID: [name], male   DOB: Oct 11, 1960, 52 y.o.   MRN: [id number] Subjective/Complaints: Didn'Arellano like oxycodone. Wants to go back on hydrocodone. Feet feel well.  A 12 point review of systems has been performed and if not noted above is otherwise negative.   Objective: Vital Signs: Blood pressure 168/76, pulse 76, temperature 98 F (36.7 C), temperature source Oral, resp. rate 18, weight 159.8 kg (352 lb 4.7 oz), SpO2 95.00%. No results found. No results found for this basename: WBC, HGB, HCT, PLT,  in the last 72 hours No results found for this basename: NA, K, CL, CO, GLUCOSE, BUN, CREATININE, CALCIUM,  in the last 72 hours CBG (last 3)   Recent Labs  11/11/12 1617 11/11/12 2110 11/12/12 0714  GLUCAP 130* 189* 115*    Wt Readings from Last 3 Encounters:  11/07/12 159.8 kg (352 lb 4.7 oz)  10/21/12 149.7 kg (330 lb 0.5 oz)  10/21/12 149.7 kg (330 lb 0.5 oz)    Physical Exam:  Constitutional: He is oriented to person, place, and time. He appears well-developed and well-nourished.  Morbidly obese HENT:  Head: Normocephalic and atraumatic.  Mouth/Throat: Abnormal dentition. Dental caries present. Oropharyngeal exudate present.  Neck: Normal range of motion.  Phonation better. Still occasionaly air leakage from trach site with speech. Cardiovascular: Normal rate and regular rhythm.  Pulmonary/Chest: Effort normal.   no wheezes a few rhonchi. No distress.  Abdominal: Soft. Bowel sounds are normal. He exhibits no distension. There is no tenderness.  Musculoskeletal: He exhibits edema (1+ pedal edema. LLE and 1+ RLE). --fluctuates Neurological: He is alert and oriented to person, place, and time.  Dysphonic,  . Right facial droop,lower facial and frontal droop, unable to close right eyelid. Still some difficulty controlling secretions. Impulsive. Some improvement insight and awareness.  Able to follow basic commands without difficulty. BUE with weakness and decrease in  fine motor movements right greater than left. Strength grossly   4- to 4 in UE,  3 to 4 with more proximal weakness in  LE's. Good sitting balance. Skin: Skin is warm and dry.   color improved in both legs. No redness or warmth.  Psych: more relaxed   Assessment/Plan: 1. 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 Physiatrist is providing close team supervision and 24 hour management of active medical problems listed below. Physiatrist and rehab team continue to assess barriers to discharge/monitor patient progress toward functional and medical goals.     HH follow up after dc today     FIM: FIM - Bathing Bathing Steps Patient Completed: Chest;Right Arm;Left Arm;Abdomen;Front perineal area;Buttocks;Right upper leg;Left upper leg Bathing: 4: Min-Patient completes 8-9 14f 10 parts or 75+ percent  FIM - Upper Body Dressing/Undressing Upper body dressing/undressing steps patient completed: Thread/unthread right sleeve of pullover shirt/dresss;Pull shirt over trunk;Put head through opening of pull over shirt/dress;Thread/unthread left sleeve of pullover shirt/dress Upper body dressing/undressing: 7: Complete Independence: No helper FIM - Lower Body Dressing/Undressing Lower body dressing/undressing steps patient completed: Thread/unthread right pants leg;Thread/unthread left pants leg;Pull pants up/down Lower body dressing/undressing: 4: Min-Patient completed 75 plus % of tasks  FIM - Toileting Toileting steps completed by patient: Adjust clothing prior to toileting;Performs perineal hygiene;Adjust clothing after toileting Toileting Assistive Devices: Grab bar or rail for support Toileting: 5: Supervision: Safety issues/verbal cues  FIM - Air cabin crew  Transfers  Assistive Devices: Insurance account manager Transfers: 5-From toilet/BSC: Supervision (verbal cues/safety issues)  FIM - Control and instrumentation engineer Devices: Walker;HOB elevated;Bed rails Bed/Chair Transfer: 5: Chair or W/C > Bed: Supervision (verbal cues/safety issues);5: Sit > Supine: Supervision (verbal cues/safety issues)  FIM - Locomotion: Wheelchair Locomotion: Wheelchair: 2: Travels 50 - 149 ft with supervision, cueing or coaxing FIM - Locomotion: Ambulation Locomotion: Ambulation Assistive Devices: Administrator Ambulation/Gait Assistance: 5: Supervision Locomotion: Ambulation: 1: Travels less than 50 ft with supervision/safety issues  Comprehension Comprehension Mode: Auditory Comprehension: 5-Understands complex 90% of the time/Cues < 10% of the time  Expression Expression Mode: Verbal Expression Assistive Devices: 6-Other (Comment) Expression: 4-Expresses basic 75 - 89% of the time/requires cueing 10 - 24% of the time. Needs helper to occlude trach/needs to repeat words.  Social Interaction Social Interaction: 5-Interacts appropriately 90% of the time - Needs monitoring or encouragement for participation or interaction.  Problem Solving Problem Solving Mode: Not assessed Problem Solving: 5-Solves basic 90% of the time/requires cueing < 10% of the time  Memory Memory Mode: Not assessed Memory: 5-Recognizes or recalls 90% of the time/requires cueing < 10% of the time  Medical Problem List and Plan:  1. DVT Prophylaxis/Anticoagulation: Mechanical: Antiembolism stockings, knee (TED hose) Bilateral lower extremities  Sequential compression devices, below knee Bilateral lower extremities.  Add lovenox.  Will check dopplers.  2. Pain Management: N/A.  Change back to hydrocodone  -wean prednisone for gout. Continue colchicine qday until follow up with me    3. Mood: Provide ego support and redirection. Has anxiety attacks  -needs regular reassurance,  explanations, and egosupport  4. Neuropsych: This patient is capable of making decisions on his/her own behalf.  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 Continue Lacrilube tid with patching at nights to prevent corneal abrasion. Eye patch at night. ?oculoplastics for gold pellet in eyelid as outpt 6. CAD: continue atenolol bid. Patient has declined statin per reports.  7. DM type 2- diet controlled: Will monitor BS with AC/HS CBG checks. Use SSI for elevated BS. Currently 120-200 range likely due to stress and stress dose steroids. Fair control at present 8. Chronic LE edema: Low salt diet. Added TEDs. Elevate legs 9. Leukocytosis: improving  -ENT closed stoma at bedside--sutures out this week? Will check with him before dc today  -dc'ed abx.  -secretions low. Voice MUCH improved  -needs to still occlude stoma when speaking       8. HTN: Monitor with bid checks. Recently more elevated.  Prednisone not helping bp.   -added low dose ACE yesterday  -continue tenormin   9. Acute renal insufficiency: resolved  LOS (Days) 21 A FACE TO FACE EVALUATION WAS PERFORMED  [name] 11/12/2012 7:42 AM

## 2012-11-12 NOTE — Progress Notes (Signed)
NUTRITION FOLLOW UP  DOCUMENTATION CODES  Per approved criteria   -Morbid Obesity    Intervention:   1. Continue Ensure Pudding, Prostat, and Magic Cups 2. Diet rec's per SLP 3. Education needs addressed this visit 4. RD to continue to follow nutrition care plan  Nutrition Dx:   Increased nutrient needs related to wound healing as evidenced by estimated needs. Ongoing.  Goal:   Intake to meet >90% of estimated nutrition needs. Improving.  Monitor:   weight trends, lab trends, I/O's, PO intake, supplement tolerance  Assessment:   Admitted with complains of R-sided hearing loss x 1 year and 34-month history of difficulty using hands, hoarseness, urinary frequency, and dizziness with balance problems requiring use of wheelchair. 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 He was admitted on 2/21 for acoustic neroma resection. Post-op required slow vent wean but tolerated ATC and was de-cannulated on 3/03. Oral secretions have decreased and FEES done revealed delay in swallow with ?aspiration/penetration with straws. Was started on D3, thin liquids. He continues to have right facial palsy, dizziness with activity and inability to phonate. Pt was followed by RD staff during acute hospitalization. Pt was maintained on enteral nutrition during vent and weaning process.   Remains on Dysphagia 1 diet. Meal intake continues to improve, pt consuming 50-75%. Continues with order for 30 ml Prostat and Ensure Pudding. RN confirms patient will take supplements as scheduled.  RD consulted for nutrition education regarding a Heart Healthy and DM diet.   RD provided "Heart Healthy Nutrition Therapy" handout from the Academy of [employer] and [employer]etics. Reviewed patient's dietary recall. Provided examples on ways to decrease sodium and fat intake in diet. Discouraged intake of processed foods and use of salt shaker. Encouraged fresh fruits and vegetables as well as whole grain  sources of carbohydrates to maximize fiber intake. Teach back method used.  RD provided "Carbohydrate Counting for People with Diabetes" handout from the Academy of Nutrition and Dietetics. Discussed different food groups and their effects on blood sugar, emphasizing carbohydrate-containing foods. Provided list of carbohydrates and recommended serving sizes of common foods.  Discussed importance of controlled and consistent carbohydrate intake throughout the day. Provided examples of ways to balance meals/snacks and encouraged intake of high-fiber, whole grain complex carbohydrates. Teach back method used.  Expect poor compliance. Pt confirms that he consumes a very high fat, high sodium diet PTA. Dietary recall reveals - sausage, spray cheese, jalapeno juice, spaghettios, etc.  Height: Ht Readings from Last 1 Encounters:  10/12/12 5\' 11"  (1.803 m)    Weight Status:   Wt Readings from Last 1 Encounters:  11/07/12 352 lb 4.7 oz (159.8 kg)  Wt trending up; noted +4 liters daily.  Re-estimated needs:  Kcal: 2300 - 2500 Protein: 115 - 140 grams Fluid: 2.3 - 2.5 liters  Skin: DTI to L and R buttocks  Diet Order: Dysphagia 1; thin liquids   Intake/Output Summary (Last 24 hours) at 11/12/12 1123 Last data filed at 11/12/12 0800  Gross per 24 hour  Intake   1140 ml  Output    300 ml  Net    840 ml    Last BM: 3/24   Labs:   Recent Labs Lab 11/06/12 0559  NA 135  K 4.3  CL 97  CO2 33*  BUN 14  CREATININE 0.67  CALCIUM 9.3  GLUCOSE 101*    CBG (last 3)   Recent Labs  11/11/12 1617 11/11/12 2110 11/12/12 0714  GLUCAP 130*  189* 115*    Scheduled Meds: . artificial tears   Right Eye Q8H  . atenolol  50 mg Oral BID  . benzocaine   Mouth/Throat TID PC & HS  . colchicine  0.6 mg Oral Daily  . enoxaparin (LOVENOX) injection  40 mg Subcutaneous Q24H  . feeding supplement  1 Container Oral TID BM  . feeding supplement  30 mL Oral TID WC  . glycopyrrolate  1 mg  Oral BID  . guaiFENesin  1,200 mg Oral BID  . hydrocerin   Topical BID  . insulin aspart  0-15 Units Subcutaneous TID WC  . insulin aspart  0-5 Units Subcutaneous QHS  . insulin aspart  3 Units Subcutaneous TID WC  . lisinopril  10 mg Oral Daily  . multivitamin with minerals  1 tablet Oral Daily  . neomycin-bacitracin-polymyxin   Topical TID  . predniSONE  20 mg Oral Q breakfast  . saccharomyces boulardii  250 mg Oral BID  . senna-docusate  2 tablet Oral BID  . tamsulosin  0.4 mg Oral QPC supper  . traZODone  50 mg Oral QHS    Continuous Infusions:  none  Inda Coke MS, RD, LDN Pager: [phone] After-hours pager: [phone]

## 2012-11-13 ENCOUNTER — Emergency Department (HOSPITAL_COMMUNITY)
Admission: EM | Admit: 2012-11-13 | Discharge: 2012-11-14 | Disposition: A | Discharge status: Home / Self Care | Payer: BC Managed Care – PPO | Attending: Emergency Medicine | Admitting: Emergency Medicine

## 2012-11-13 DIAGNOSIS — I252 Old myocardial infarction: Secondary | ICD-10-CM | POA: Insufficient documentation

## 2012-11-13 DIAGNOSIS — Z87442 Personal history of urinary calculi: Secondary | ICD-10-CM | POA: Insufficient documentation

## 2012-11-13 DIAGNOSIS — Z8739 Personal history of other diseases of the musculoskeletal system and connective tissue: Secondary | ICD-10-CM | POA: Insufficient documentation

## 2012-11-13 DIAGNOSIS — Z8679 Personal history of other diseases of the circulatory system: Secondary | ICD-10-CM | POA: Insufficient documentation

## 2012-11-13 DIAGNOSIS — I2510 Atherosclerotic heart disease of native coronary artery without angina pectoris: Secondary | ICD-10-CM | POA: Insufficient documentation

## 2012-11-13 DIAGNOSIS — Z951 Presence of aortocoronary bypass graft: Secondary | ICD-10-CM | POA: Insufficient documentation

## 2012-11-13 DIAGNOSIS — I509 Heart failure, unspecified: Secondary | ICD-10-CM | POA: Insufficient documentation

## 2012-11-13 DIAGNOSIS — Z79899 Other long term (current) drug therapy: Secondary | ICD-10-CM | POA: Insufficient documentation

## 2012-11-13 DIAGNOSIS — E119 Type 2 diabetes mellitus without complications: Secondary | ICD-10-CM | POA: Insufficient documentation

## 2012-11-13 DIAGNOSIS — Y838 Other surgical procedures as the cause of abnormal reaction of the patient, or of later complication, without mention of misadventure at the time of the procedure: Secondary | ICD-10-CM | POA: Insufficient documentation

## 2012-11-13 DIAGNOSIS — G9782 Other postprocedural complications and disorders of nervous system: Secondary | ICD-10-CM

## 2012-11-13 DIAGNOSIS — F17200 Nicotine dependence, unspecified, uncomplicated: Secondary | ICD-10-CM | POA: Insufficient documentation

## 2012-11-13 DIAGNOSIS — G969 Disorder of central nervous system, unspecified: Secondary | ICD-10-CM | POA: Insufficient documentation

## 2012-11-13 DIAGNOSIS — R51 Headache: Secondary | ICD-10-CM | POA: Insufficient documentation

## 2012-11-13 DIAGNOSIS — E669 Obesity, unspecified: Secondary | ICD-10-CM | POA: Insufficient documentation

## 2012-11-13 DIAGNOSIS — I10 Essential (primary) hypertension: Secondary | ICD-10-CM | POA: Insufficient documentation

## 2012-11-13 MED ORDER — KETOROLAC TROMETHAMINE 30 MG/ML IJ SOLN
30.0000 mg | Freq: Once | INTRAMUSCULAR | Status: AC
  Administered 2012-11-14: 30 mg via INTRAVENOUS
  Filled 2012-11-13: qty 1

## 2012-11-13 MED ORDER — SODIUM CHLORIDE 0.9 % IV SOLN
INTRAVENOUS | Status: DC
  Administered 2012-11-14: via INTRAVENOUS

## 2012-11-13 MED ORDER — VANCOMYCIN HCL IN DEXTROSE 1-5 GM/200ML-% IV SOLN
1000.0000 mg | Freq: Once | INTRAVENOUS | Status: AC
  Administered 2012-11-14: 1000 mg via INTRAVENOUS
  Filled 2012-11-13: qty 200

## 2012-11-13 NOTE — ED Notes (Signed)
Pt c/o of HA, and neck pain and swelling around operative site.

## 2012-11-13 NOTE — ED Provider Notes (Signed)
History     CSN: [id number]  Arrival date & time 11/13/12  2142   First MD Initiated Contact with Patient 11/13/12 2346      Chief Complaint  Patient presents with  . Headache    (Consider location/radiation/quality/duration/timing/severity/associated sxs/prior treatment) HPI History provided by patient and family bedside. Discharged home from rehabilitation facility today and now has developed swelling and redness around cranial surgical site.  Patient had resection of a brain tumor about a month ago with extended hospitalization.  She denies any fevers. No nausea vomiting.  He has daily headaches and takes hydrocodone. He took one hydrocodone today and stated it did not help. He called his primary care physician, Dr. Arelia Sneddon who recommended he come to the emergency department to be evaluated. Pain at the site is described as sharp in quality and not radiating. Mod in severity. No new weakness or numbness. Is taking prednisone and has ongoing lower surgery swelling. No shortness of breath. No chest pain. Past Medical History  Diagnosis Date  . CAD (coronary artery disease)     Cath 09, 99% LAD  . Cardiomyopathy, ischemic     Ischemic  . Obesity   . Fibromyalgia   . Shortness of breath   . CHF (congestive heart failure)   . Hypertension     dr Percival Spanish  . Cyst near coccyx   . Diabetes mellitus without complication     borderline  . Cyst near tailbone   . Kidney stones   . Myocardial infarction   . Headache     with lights    Past Surgical History  Procedure Laterality Date  . Coronary artery bypass graft      LIMA to LAD 2009  . Umbilical hernia repair    . Anal fistulectomy    . Retrosigmoid craniectomy for tumor resection Right 10/11/2012    Procedure: RETROSIGMOID CRANIECTOMY FOR TUMOR RESECTION;  Surgeon: [name], MD;  Location: [facility];  Service: Neurosurgery;  Laterality: Right;  Craniotomy for acoustic neuroma  . Tracheostomy tube placement N/A  10/11/2012    Procedure: TRACHEOSTOMY;  Surgeon: Ascencion Dike, MD;  Location: [facility];  Service: ENT;  Laterality: N/A;  . Acoustic neuroma resection N/A 10/11/2012    Procedure: ACOUSTIC NEUROMA RESECTION;  Surgeon: Ascencion Dike, MD;  Location: [facility];  Service: ENT;  Laterality: N/A;    Family History  Problem Relation Age of Onset  . CAD Father     History  Substance Use Topics  . Smoking status: Heavy Tobacco Smoker -- 0.50 packs/day for 20 years    Types: Cigarettes  . Smokeless tobacco: Current User     Comment: Rare tobacco.  "[name]"  . Alcohol Use: No      Review of Systems  Constitutional: Negative for fever and chills.  HENT: Negative for neck pain and neck stiffness.   Eyes: Negative for redness.  Respiratory: Negative for shortness of breath.   Cardiovascular: Negative for chest pain.  Gastrointestinal: Negative for abdominal pain.  Genitourinary: Negative for dysuria.  Musculoskeletal: Negative for back pain.  Skin: Negative for rash.  Neurological: Positive for headaches.  All other systems reviewed and are negative.    Allergies  Morphine and related and Other  Home Medications   Current Outpatient Rx  Name  Route  Sig  Dispense  Refill  . albuterol (PROVENTIL HFA;VENTOLIN HFA) 108 (90 BASE) MCG/ACT inhaler   Inhalation   Inhale 2  puffs into the lungs every 6 (six) hours as needed for wheezing or shortness of breath.   1 Inhaler   1   . antiseptic oral rinse (BIOTENE) LIQD   Mouth Rinse   15 mLs by Mouth Rinse route QID.   1 Bottle   0   . artificial tears (LACRILUBE) OINT ophthalmic ointment   Ophthalmic   Apply to eye every 8 (eight) hours.   1 Tube   1   . atenolol (TENORMIN) 50 MG tablet   Oral   Take 1 tablet (50 mg total) by mouth 2 (two) times daily. Note increased dose.   60 tablet   1   . benzocaine (ORAJEL) 10 % mucosal gel   Mouth/Throat   Use as directed in the mouth or throat 4 (four) times daily -  after meals and at bedtime.   5.3 g      . chlorhexidine (PERIDEX) 0.12 % solution   Mouth/Throat   Use as directed 15 mLs in the mouth or throat 2 (two) times daily.   120 mL   0   . colchicine 0.6 MG tablet   Oral   Take 1 tablet (0.6 mg total) by mouth daily.   30 tablet   1   . guaiFENesin (MUCINEX) 600 MG 12 hr tablet   Oral   Take 2 tablets (1,200 mg total) by mouth 2 (two) times daily. To help loosen secretions.   120 tablet   1   . hydrocerin (EUCERIN) CREA   Topical   Apply 1 application topically 2 (two) times daily.         Marland Kitchen HYDROcodone-acetaminophen (NORCO) 10-325 MG per tablet   Oral   Take 1 tablet by mouth every 6 (six) hours as needed. For severe pain.   60 tablet   0   . lisinopril (PRINIVIL,ZESTRIL) 10 MG tablet   Oral   Take 1 tablet (10 mg total) by mouth daily. New medication for blood pressure control   30 tablet   1   . Multiple Vitamin (MULTIVITAMIN WITH MINERALS) TABS   Oral   Take 1 tablet by mouth daily.         Marland Kitchen neomycin-bacitracin-polymyxin (NEOSPORIN) OINT   Topical   Apply 1 application topically 3 (three) times daily. To neck         . predniSONE (DELTASONE) 20 MG tablet   Oral   Take 1 tablet (20 mg total) by mouth daily with breakfast.   4 tablet   0   . senna-docusate (SENOKOT-S) 8.6-50 MG per tablet   Oral   Take 2 tablets by mouth 2 (two) times daily. For constipation.         . tamsulosin (FLOMAX) 0.4 MG CAPS   Oral   Take 1 capsule (0.4 mg total) by mouth daily after supper. For bladder.   30 capsule   1   . traZODone (DESYREL) 50 MG tablet   Oral   Take 1 tablet (50 mg total) by mouth at bedtime. For insomnia.   30 tablet   1     BP 142/65  Pulse 72  Temp(Src) 98.6 F (37 C)  Resp 20  SpO2 97%  Physical Exam  Constitutional: He is oriented to person, place, and time. He appears well-developed and well-nourished.  HENT:  Head: Normocephalic.  Patch over right eye Some right-sided facial  droop. Right scalp posterior a regular surgical site has an area of erythema and localized swelling to the  inferior aspect. No wound drainage. Mild tenderness to palpation.  Eyes: EOM are normal. Pupils are equal, round, and reactive to light.  Neck: No tracheal deviation present.  Cardiovascular: Normal rate, regular rhythm and intact distal pulses.   Pulmonary/Chest: Effort normal and breath sounds normal. No stridor. No respiratory distress.  Abdominal: Soft. Bowel sounds are normal. He exhibits no distension. There is no tenderness.  Musculoskeletal: Normal range of motion.  2+ symmetric lower extremity edema  Neurological: He is alert and oriented to person, place, and time.  Skin: Skin is warm and dry.    ED Course  Procedures (including critical care time)  Results for orders placed during the hospital encounter of 11/13/12  CBC      Result Value Range   WBC 18.4 (*) 4.0 - 10.5 K/uL   RBC 4.66  4.22 - 5.81 MIL/uL   Hemoglobin 14.3  13.0 - 17.0 g/dL   HCT 43.6  39.0 - 52.0 %   MCV 93.6  78.0 - 100.0 fL   MCH 30.7  26.0 - 34.0 pg   MCHC 32.8  30.0 - 36.0 g/dL   RDW 14.4  11.5 - 15.5 %   Platelets 323  150 - 400 K/uL  POCT I-STAT, CHEM 8      Result Value Range   Sodium 131 (*) 135 - 145 mEq/L   Potassium 3.9  3.5 - 5.1 mEq/L   Chloride 91 (*) 96 - 112 mEq/L   BUN 17  6 - 23 mg/dL   Creatinine, Ser 0.80  0.50 - 1.35 mg/dL   Glucose, Bld 113 (*) 70 - 99 mg/dL   Calcium, Ion 1.17  1.12 - 1.23 mmol/L   TCO2 34  0 - 100 mmol/L   Hemoglobin 15.6  13.0 - 17.0 g/dL   HCT 46.0  39.0 - 52.0 %  CG4 I-STAT (LACTIC ACID)      Result Value Range   Lactic Acid, Venous 2.28 (*) 0.5 - 2.2 mmol/L   Dg Chest 2 View  10/28/2012  *RADIOLOGY REPORT*  Clinical Data: Persistent secretions and cough, history coronary artery disease, ischemic cardiomyopathy, CHF, hypertension, diabetes, MI  CHEST - 2 VIEW  Comparison: 10/26/2012  Findings: Enlargement of cardiac silhouette post median  sternotomy. Mediastinal contours normal. Slight pulmonary vascular congestion. Subsegmental atelectasis right middle lobe. Slight chronic accentuation of perihilar markings similar to previous exam. No definite acute failure or segmental consolidation. No pleural effusion or pneumothorax. Bones unremarkable.  IMPRESSION: Subsegmental atelectasis right middle lobe. Enlargement of cardiac silhouette with slight pulmonary vascular congestion.   Original Report Authenticated By: Lavonia Dana, M.D.    Ct Head Wo Contrast  11/14/2012  *RADIOLOGY REPORT*  Clinical Data: Headache.  Neck pain and swelling around the operative site.  CT HEAD WITHOUT CONTRAST  Technique:  Contiguous axial images were obtained from the base of the skull through the vertex without contrast.  Comparison: 11/07/2012.  Findings: Postoperative changes of resection of the right CP angle mass.  Mastoid fluid is present.  There is a fluid attenuation collection extending from the craniectomy measuring 51 mm x 38 mm (axial image #7).  The size appears similar to the prior exam and this is a low density, suggesting simple fluid, probably pseudomeningocele.  The amount of extension into the right neck appears similar to the recent prior.  A postoperative seroma can have a similar appearance.  Superimposed infection cannot be excluded.  Supratentorially, no mass lesion, mass effect, midline shift, hydrocephalus, or hemorrhage.  IMPRESSION:  1.  Postoperative changes of right CP angle mass resection. Persistent fluid collection in the operative bed which may represent pseudomeningocele.  The size appears unchanged compared to CT 11/07/2012. 2.  No acute intracranial abnormality identified.   Original Report Authenticated By: Dereck Ligas, M.D.    Ct Head Wo Contrast  11/07/2012  *RADIOLOGY REPORT*  Clinical Data: Status post right cerebellopontine acoustic neuroma resection.  Left sided numbness and tingling.  CT HEAD WITHOUT CONTRAST  Technique:   Contiguous axial images were obtained from the base of the skull through the vertex without contrast.  Comparison: Head CT 02/23/2014no brain MRI 08/12/2012  Findings: Stable low attenuation in the right cerebellar peduncle, in this patient status post resection of a right cerebellopontine angle mass.  The ventricles are normal in size.  Negative for hemorrhage, hydrocephalus, midline shift, or evidence of acute cortically based infarction.  The previously described subcutaneous and subdural gas has resolved.  There are postoperative changes of the right rectosigmoid craniectomy.  There is some fluid and within the craniectomy site.  The imaged paranasal sinuses are clear. There is fluid in the right mastoid air cells.  IMPRESSION:  1.  No acute intracranial abnormality identified. Interval resolution of subcutaneous and subdural gas in the CT of 10/13/2012. 2.  Stable postoperative changes of resection of a right cerebellopontine angle mass. 3.  Right mastoid effusion.   Original Report Authenticated By: Curlene Dolphin, M.D.    Dg Chest Port 1 View  10/26/2012  *RADIOLOGY REPORT*  Clinical Data: Difficulty breathing and shortness of breath.  PORTABLE CHEST - 1 VIEW  Comparison: Chest radiograph performed 10/16/2012  Findings: The lungs are well-aerated.  Mild vascular congestion is noted.  Right-sided atelectasis is seen.  Underlying mild right basilar opacity may reflect mild pneumonia or residual interstitial edema.  No pleural effusion or pneumothorax is identified.  The cardiomediastinal silhouette is borderline normal in size; the patient is status post median sternotomy.  No acute osseous abnormalities are seen.  IMPRESSION: Mild vascular congestion noted.  Right-sided atelectasis seen. Underlying mild right basilar opacity may reflect mild pneumonia or residual interstitial edema.   Original Report Authenticated By: Santa Lighter, M.D.    [facility] 1 View  10/16/2012  *RADIOLOGY REPORT*  Clinical  Data: Evaluate pulmonary infiltrates.  PORTABLE CHEST - 1 VIEW  Comparison: 10/15/2012  Findings: Tracheostomy tube and nasogastric tube are present. Patchy interstitial densities may represent mild edema.  Heart size is enlarged.  Median sternotomy wires are present.  Persistent right basilar densities could represent atelectasis.  IMPRESSION: Minimal change in the patchy lung densities.  Findings probably represent edema.  Right basilar atelectasis.  Cardiomegaly.   Original Report Authenticated By: Markus Daft, M.D.    Dg Chest Port 1 View  10/15/2012  *RADIOLOGY REPORT*  Clinical Data:  Assess pulmonary edema and atelectasis.  PORTABLE CHEST - 1 VIEW  Comparison: 10/14/2012  Findings: A tracheostomy tube is present.  Patchy parenchymal densities are suggestive for pulmonary edema.  Slightly increased densities at the right lung base could represent atelectasis but cannot exclude airspace disease.  Heart size is grossly stable. Median sternotomy wires are present.  IMPRESSION: Minimal change in the patchy bilateral lung densities.  Findings are suggestive for pulmonary edema. Densities at the right lung base may represent atelectasis.   Original Report Authenticated By: Markus Daft, M.D.    Dg Abd Portable 1v  10/20/2012  *RADIOLOGY REPORT*  Clinical Data: Nasogastric tube placement.  PORTABLE ABDOMEN -  1 VIEW  Comparison: Abdominal radiograph performed 10/16/2012  Findings: The patient's nasogastric tube is noted ending overlying the distal esophagus, just proximal to the expected location of the gastroesophageal junction.  The visualized bowel gas pattern is grossly unremarkable.  Right- sided airspace opacification is not well characterized.  No acute osseous abnormalities are seen.  Mild degenerative change is noted at the lower lumbar spine.  Focal calcifications are suggested overlying the right upper quadrant.  IMPRESSION:  1.  Nasogastric tube noted ending overlying the distal esophagus, just proximal to  the expected location of the gastroesophageal junction. 2.  Right-sided airspace opacification, not well characterized.   Original Report Authenticated By: Santa Lighter, M.D.    Dg Abd Portable 1v  10/16/2012  *RADIOLOGY REPORT*  Clinical Data: Evaluate nasogastric tube placement.  PORTABLE ABDOMEN - 1 VIEW  Comparison: 10/15/2012  Findings: Nasogastric tube is in the left upper abdomen and most likely within the stomach body region.  Patchy densities at the lung bases.  The patient has median sternotomy wires.  IMPRESSION: Nasogastric tube in the stomach region.   Original Report Authenticated By: Markus Daft, M.D.    Dg Abd Portable 1v  10/15/2012  *RADIOLOGY REPORT*  Clinical Data: NG tube placement.  PORTABLE ABDOMEN - 1 VIEW  Comparison: Chest x-ray from the same day.  Findings: The side port of the NG tube is just beyond the GE junction.  Bibasilar airspace disease is evident.  The heart is enlarged.  IMPRESSION:  1.  The side port the NG tube is just beyond the GE junction. 2.  Bibasilar airspace disease. While this likely reflects atelectasis, infection is not excluded.   Original Report Authenticated By: San Morelle, M.D.     IV Vancomycin  1:45 AM d/w NSG on call, Dr Saintclair Halsted will see PT in the ER 2:15 AM Dr Saintclair Halsted evaluated PT, does not feel admission is required - CT pending.   CT reviewed with PT - he is feeling well and comfortable with plan d/c home, he agrees to keep scheduled follow up with his PCP and NSG. Infection precautions provided.  MDM  Headache and new redness and swelling around the surgical site. Evaluated with labs and imaging reviewed as above. IV antibiotics provided.  Plan discharge home and outpatient follow-up.         Teressa Lower, MD 11/14/12 [phone]

## 2012-11-14 ENCOUNTER — Emergency Department (HOSPITAL_COMMUNITY): Payer: BC Managed Care – PPO

## 2012-11-14 ENCOUNTER — Encounter (HOSPITAL_COMMUNITY): Payer: Self-pay | Admitting: Radiology

## 2012-11-14 LAB — POCT I-STAT, CHEM 8
Calcium, Ion: 1.17 mmol/L (ref 1.12–1.23)
Creatinine, Ser: 0.8 mg/dL (ref 0.50–1.35)
Glucose, Bld: 113 mg/dL — ABNORMAL HIGH (ref 70–99)
HCT: 46 % (ref 39.0–52.0)
Hemoglobin: 15.6 g/dL (ref 13.0–17.0)

## 2012-11-14 LAB — CBC
HCT: 43.6 % (ref 39.0–52.0)
MCH: 30.7 pg (ref 26.0–34.0)
MCHC: 32.8 g/dL (ref 30.0–36.0)
MCV: 93.6 fL (ref 78.0–100.0)
RDW: 14.4 % (ref 11.5–15.5)

## 2012-11-14 MED ORDER — CEPHALEXIN 500 MG PO CAPS: 500.0000 mg | ORAL_CAPSULE | Freq: Four times a day (QID) | ORAL | Status: DC

## 2012-11-14 NOTE — ED Notes (Signed)
Family at bedside. 

## 2012-11-14 NOTE — ED Notes (Addendum)
Pt urinates on bed and gown. Pt cleaned up and bed changed by techs.

## 2012-11-14 NOTE — ED Notes (Signed)
MD at bedside. Consult

## 2012-11-14 NOTE — ED Notes (Addendum)
Pt asked for heating pad for right lower leg. Pt received by tech. Pt wound cleaned on buttock and dressing applied.

## 2012-11-19 ENCOUNTER — Emergency Department (HOSPITAL_COMMUNITY): Payer: BC Managed Care – PPO

## 2012-11-19 ENCOUNTER — Inpatient Hospital Stay (HOSPITAL_COMMUNITY)
Admission: EM | Admit: 2012-11-19 | Discharge: 2012-12-01 | DRG: 001 | Disposition: A | Discharge status: Home / Self Care | Payer: BC Managed Care – PPO | Attending: Neurosurgery | Admitting: Neurosurgery

## 2012-11-19 ENCOUNTER — Encounter (HOSPITAL_COMMUNITY): Payer: Self-pay

## 2012-11-19 DIAGNOSIS — G510 Bell's palsy: Secondary | ICD-10-CM | POA: Diagnosis present

## 2012-11-19 DIAGNOSIS — L0291 Cutaneous abscess, unspecified: Secondary | ICD-10-CM

## 2012-11-19 DIAGNOSIS — I509 Heart failure, unspecified: Secondary | ICD-10-CM | POA: Diagnosis present

## 2012-11-19 DIAGNOSIS — I2510 Atherosclerotic heart disease of native coronary artery without angina pectoris: Secondary | ICD-10-CM | POA: Diagnosis present

## 2012-11-19 DIAGNOSIS — I10 Essential (primary) hypertension: Secondary | ICD-10-CM | POA: Diagnosis present

## 2012-11-19 DIAGNOSIS — Y838 Other surgical procedures as the cause of abnormal reaction of the patient, or of later complication, without mention of misadventure at the time of the procedure: Secondary | ICD-10-CM | POA: Diagnosis present

## 2012-11-19 DIAGNOSIS — R51 Headache: Secondary | ICD-10-CM

## 2012-11-19 DIAGNOSIS — R509 Fever, unspecified: Secondary | ICD-10-CM

## 2012-11-19 DIAGNOSIS — Z79899 Other long term (current) drug therapy: Secondary | ICD-10-CM

## 2012-11-19 DIAGNOSIS — I252 Old myocardial infarction: Secondary | ICD-10-CM

## 2012-11-19 DIAGNOSIS — G988 Other disorders of nervous system: Principal | ICD-10-CM | POA: Diagnosis present

## 2012-11-19 DIAGNOSIS — Z951 Presence of aortocoronary bypass graft: Secondary | ICD-10-CM

## 2012-11-19 DIAGNOSIS — Z6839 Body mass index (BMI) 39.0-39.9, adult: Secondary | ICD-10-CM

## 2012-11-19 DIAGNOSIS — G9782 Other postprocedural complications and disorders of nervous system: Secondary | ICD-10-CM

## 2012-11-19 DIAGNOSIS — G96198 Other disorders of meninges, not elsewhere classified: Secondary | ICD-10-CM | POA: Diagnosis present

## 2012-11-19 DIAGNOSIS — F17200 Nicotine dependence, unspecified, uncomplicated: Secondary | ICD-10-CM | POA: Diagnosis present

## 2012-11-19 DIAGNOSIS — I2589 Other forms of chronic ischemic heart disease: Secondary | ICD-10-CM | POA: Diagnosis present

## 2012-11-19 DIAGNOSIS — E119 Type 2 diabetes mellitus without complications: Secondary | ICD-10-CM | POA: Diagnosis present

## 2012-11-19 LAB — CBC WITH DIFFERENTIAL/PLATELET
Basophils Relative: 1 % (ref 0–1)
Eosinophils Absolute: 0.2 10*3/uL (ref 0.0–0.7)
Eosinophils Relative: 1 % (ref 0–5)
MCH: 30.5 pg (ref 26.0–34.0)
MCHC: 33 g/dL (ref 30.0–36.0)
MCV: 92.5 fL (ref 78.0–100.0)
Monocytes Relative: 11 % (ref 3–12)
Neutrophils Relative %: 75 % (ref 43–77)
Platelets: 335 10*3/uL (ref 150–400)

## 2012-11-19 LAB — URINALYSIS, ROUTINE W REFLEX MICROSCOPIC
Glucose, UA: NEGATIVE mg/dL
Ketones, ur: NEGATIVE mg/dL
Protein, ur: NEGATIVE mg/dL
Urobilinogen, UA: 0.2 mg/dL (ref 0.0–1.0)

## 2012-11-19 LAB — POCT I-STAT, CHEM 8
Creatinine, Ser: 0.8 mg/dL (ref 0.50–1.35)
HCT: 48 % (ref 39.0–52.0)
Hemoglobin: 16.3 g/dL (ref 13.0–17.0)
Potassium: 4 mEq/L (ref 3.5–5.1)
Sodium: 134 mEq/L — ABNORMAL LOW (ref 135–145)

## 2012-11-19 LAB — URINE MICROSCOPIC-ADD ON

## 2012-11-19 MED ORDER — ATENOLOL 50 MG PO TABS: 50.0000 mg | ORAL_TABLET | Freq: Two times a day (BID) | ORAL | Status: DC

## 2012-11-19 MED ORDER — IOHEXOL 300 MG/ML  SOLN
80.0000 mL | Freq: Once | INTRAMUSCULAR | Status: AC | PRN
  Administered 2012-11-19: 80 mL via INTRAVENOUS

## 2012-11-19 MED ORDER — SODIUM CHLORIDE 0.9 % IV BOLUS (SEPSIS)
1000.0000 mL | Freq: Once | INTRAVENOUS | Status: AC
  Administered 2012-11-19: 1000 mL via INTRAVENOUS

## 2012-11-19 MED ORDER — COLCHICINE 0.6 MG PO TABS
0.6000 mg | ORAL_TABLET | Freq: Every day | ORAL | Status: DC
  Administered 2012-11-20 – 2012-12-01 (×12): 0.6 mg via ORAL
  Filled 2012-11-19 (×13): qty 1

## 2012-11-19 MED ORDER — GUAIFENESIN ER 600 MG PO TB12
1200.0000 mg | ORAL_TABLET | Freq: Two times a day (BID) | ORAL | Status: DC
  Administered 2012-11-20 – 2012-11-28 (×17): 1200 mg via ORAL
  Administered 2012-11-29: 600 mg via ORAL
  Administered 2012-11-29 – 2012-11-30 (×3): 1200 mg via ORAL
  Filled 2012-11-19 (×25): qty 2

## 2012-11-19 MED ORDER — BENZOCAINE 10 % MT GEL: 1.0000 "application " | Freq: Two times a day (BID) | OROMUCOSAL | Status: DC | PRN

## 2012-11-19 MED ORDER — TRAZODONE HCL 50 MG PO TABS: 50.0000 mg | ORAL_TABLET | Freq: Every day | ORAL | Status: DC

## 2012-11-19 MED ORDER — ALBUTEROL SULFATE HFA 108 (90 BASE) MCG/ACT IN AERS
2.0000 | INHALATION_SPRAY | Freq: Four times a day (QID) | RESPIRATORY_TRACT | Status: DC | PRN
Start: 2012-11-19 — End: 2012-12-01
  Administered 2012-11-20 – 2012-11-25 (×3): 2 via RESPIRATORY_TRACT
  Filled 2012-11-19: qty 6.7

## 2012-11-19 MED ORDER — ARTIFICIAL TEARS OP OINT
1.0000 "application " | TOPICAL_OINTMENT | Freq: Every day | OPHTHALMIC | Status: DC
  Administered 2012-11-20 – 2012-11-30 (×11): 1 via OPHTHALMIC
  Filled 2012-11-19 (×2): qty 3.5

## 2012-11-19 MED ORDER — TAMSULOSIN HCL 0.4 MG PO CAPS
0.4000 mg | ORAL_CAPSULE | Freq: Every day | ORAL | Status: DC
  Administered 2012-11-21 – 2012-11-30 (×10): 0.4 mg via ORAL
  Filled 2012-11-19 (×12): qty 1

## 2012-11-19 MED ORDER — BIOTENE DRY MOUTH MT LIQD
15.0000 mL | Freq: Every day | OROMUCOSAL | Status: DC
  Administered 2012-11-21 – 2012-11-30 (×10): 15 mL via OROMUCOSAL

## 2012-11-19 MED ORDER — CHLORHEXIDINE GLUCONATE 0.12 % MT SOLN
15.0000 mL | Freq: Two times a day (BID) | OROMUCOSAL | Status: DC
  Administered 2012-11-20 – 2012-12-01 (×20): 15 mL via OROMUCOSAL
  Filled 2012-11-19 (×22): qty 15

## 2012-11-19 MED ORDER — ACETAMINOPHEN 325 MG PO TABS
650.0000 mg | ORAL_TABLET | Freq: Once | ORAL | Status: AC
  Administered 2012-11-19: 650 mg via ORAL
  Filled 2012-11-19: qty 2

## 2012-11-19 MED ORDER — CLINDAMYCIN PHOSPHATE 900 MG/50ML IV SOLN
900.0000 mg | Freq: Once | INTRAVENOUS | Status: AC
  Administered 2012-11-19: 900 mg via INTRAVENOUS
  Filled 2012-11-19: qty 50

## 2012-11-19 MED ORDER — HYDROCODONE-ACETAMINOPHEN 10-325 MG PO TABS
1.0000 | ORAL_TABLET | ORAL | Status: DC | PRN
  Administered 2012-11-20: 2 via ORAL
  Administered 2012-11-20 – 2012-11-21 (×5): 1 via ORAL
  Administered 2012-11-22: 2 via ORAL
  Administered 2012-11-22 (×2): 1 via ORAL
  Administered 2012-11-23 – 2012-11-28 (×19): 2 via ORAL
  Administered 2012-11-29: 1 via ORAL
  Administered 2012-11-29 – 2012-11-30 (×5): 2 via ORAL
  Administered 2012-11-30 (×2): 1 via ORAL
  Administered 2012-11-30: 2 via ORAL
  Administered 2012-12-01: 1 via ORAL
  Administered 2012-12-01: 2 via ORAL
  Filled 2012-11-19 (×6): qty 2
  Filled 2012-11-19: qty 1
  Filled 2012-11-19: qty 2
  Filled 2012-11-19 (×5): qty 1
  Filled 2012-11-19: qty 2
  Filled 2012-11-19: qty 1
  Filled 2012-11-19 (×2): qty 2
  Filled 2012-11-19: qty 1
  Filled 2012-11-19 (×2): qty 2
  Filled 2012-11-19: qty 1
  Filled 2012-11-19: qty 2
  Filled 2012-11-19: qty 1
  Filled 2012-11-19 (×4): qty 2
  Filled 2012-11-19: qty 1
  Filled 2012-11-19 (×11): qty 2

## 2012-11-19 NOTE — ED Notes (Signed)
Pt helped back into bed after using the restroom.

## 2012-11-19 NOTE — H&P (Signed)
[name] is an 52 y.o. male.   Chief Complaint: wound drainage HPI: patient who underwent right posterior fossa craniectomy for resection of an acoustic neuroma about 5 weeks ago by drsTHEO/CABBELL.  He had a tracheostmy and later on transferred to the rehabilitation unit. Discharge several days ago ,he has been complaining of low grade fever and some drainage from the wound. Seen in the er few days ago he was started on abs. Today he came to the er and a ct head was done and we were called to see him  Past Medical History  Diagnosis Date  . CAD (coronary artery disease)     Cath 09, 99% LAD  . Cardiomyopathy, ischemic     Ischemic  . Obesity   . Fibromyalgia   . Shortness of breath   . CHF (congestive heart failure)   . Hypertension     dr Percival Spanish  . Cyst near coccyx   . Diabetes mellitus without complication     borderline  . Cyst near tailbone   . Kidney stones   . Myocardial infarction   . Headache     with lights    Past Surgical History  Procedure Laterality Date  . Coronary artery bypass graft      LIMA to LAD 2009  . Umbilical hernia repair    . Anal fistulectomy    . Retrosigmoid craniectomy for tumor resection Right 10/11/2012    Procedure: RETROSIGMOID CRANIECTOMY FOR TUMOR RESECTION;  Surgeon: [name], MD;  Location: [facility];  Service: Neurosurgery;  Laterality: Right;  Craniotomy for acoustic neuroma  . Tracheostomy tube placement N/A 10/11/2012    Procedure: TRACHEOSTOMY;  Surgeon: Ascencion Dike, MD;  Location: [facility];  Service: ENT;  Laterality: N/A;  . Acoustic neuroma resection N/A 10/11/2012    Procedure: ACOUSTIC NEUROMA RESECTION;  Surgeon: Ascencion Dike, MD;  Location: [facility];  Service: ENT;  Laterality: N/A;    Family History  Problem Relation Age of Onset  . CAD Father    Social History:  reports that he has been smoking Cigarettes.  He has a 10 pack-year smoking history. He uses smokeless tobacco. He reports that he does not  drink alcohol or use illicit drugs.  Allergies:  Allergies  Allergen Reactions  . Morphine And Related Anaphylaxis    "makes me stop breathing"  . Other     Steroids makes hearts race     (Not in a hospital admission)  Results for orders placed during the hospital encounter of 11/19/12 (from the past 48 hour(s))  CBC WITH DIFFERENTIAL     Status: Abnormal   Collection Time    11/19/12  5:56 PM      Result Value Range   WBC 14.2 (*) 4.0 - 10.5 K/uL   RBC 4.82  4.22 - 5.81 MIL/uL   Hemoglobin 14.7  13.0 - 17.0 g/dL   HCT 44.6  39.0 - 52.0 %   MCV 92.5  78.0 - 100.0 fL   MCH 30.5  26.0 - 34.0 pg   MCHC 33.0  30.0 - 36.0 g/dL   RDW 14.1  11.5 - 15.5 %   Platelets 335  150 - 400 K/uL   Neutrophils Relative 75  43 - 77 %   Neutro Abs 10.7 (*) 1.7 - 7.7 K/uL   Lymphocytes Relative 11 (*) 12 - 46 %   Lymphs Abs 1.6  0.7 - 4.0 K/uL   Monocytes Relative 11  3 - 12 %   Monocytes Absolute 1.6 (*) 0.1 - 1.0 K/uL   Eosinophils Relative 1  0 - 5 %   Eosinophils Absolute 0.2  0.0 - 0.7 K/uL   Basophils Relative 1  0 - 1 %   Basophils Absolute 0.1  0.0 - 0.1 K/uL  POCT I-STAT, CHEM 8     Status: Abnormal   Collection Time    11/19/12  6:30 PM      Result Value Range   Sodium 134 (*) 135 - 145 mEq/L   Potassium 4.0  3.5 - 5.1 mEq/L   Chloride 94 (*) 96 - 112 mEq/L   BUN 8  6 - 23 mg/dL   Creatinine, Ser 0.80  0.50 - 1.35 mg/dL   Glucose, Bld 96  70 - 99 mg/dL   Calcium, Ion 1.20  1.12 - 1.23 mmol/L   TCO2 34  0 - 100 mmol/L   Hemoglobin 16.3  13.0 - 17.0 g/dL   HCT 48.0  39.0 - 52.0 %  URINALYSIS, ROUTINE W REFLEX MICROSCOPIC     Status: Abnormal   Collection Time    11/19/12 10:17 PM      Result Value Range   Color, Urine YELLOW  YELLOW   APPearance CLEAR  CLEAR   Specific Gravity, Urine 1.017  1.005 - 1.030   pH 6.0  5.0 - 8.0   Glucose, UA NEGATIVE  NEGATIVE mg/dL   Hgb urine dipstick TRACE (*) NEGATIVE   Bilirubin Urine NEGATIVE  NEGATIVE   Ketones, ur NEGATIVE   NEGATIVE mg/dL   Protein, ur NEGATIVE  NEGATIVE mg/dL   Urobilinogen, UA 0.2  0.0 - 1.0 mg/dL   Nitrite NEGATIVE  NEGATIVE   Leukocytes, UA NEGATIVE  NEGATIVE  URINE MICROSCOPIC-ADD ON     Status: None   Collection Time    11/19/12 10:17 PM      Result Value Range   Squamous Epithelial / LPF RARE  RARE   WBC, UA 0-2  <3 WBC/hpf   RBC / HPF 3-6  <3 RBC/hpf   Bacteria, UA RARE  RARE   Dg Chest 2 View  11/19/2012  *RADIOLOGY REPORT*  Clinical Data: Headache and fever  CHEST - 2 VIEW  Comparison: 10/28/2012  Findings: Mild cardiac enlargement.  The patient is status post median sternotomy and CABG procedure.  There is a small right pleural effusion.  Increased prominence of septal lines consistent with interstitial edema.  No airspace consolidation.  IMPRESSION:  1.  Mild CHF. 2.  No evidence for pneumonia.   Original Report Authenticated By: Kerby Moors, M.D.    Ct Head W Wo Contrast  11/19/2012  *RADIOLOGY REPORT*  Clinical Data: Recent surgery, headache, fever, evaluate for abscess  CT HEAD WITHOUT AND WITH CONTRAST  Technique:  Contiguous axial images were obtained from the base of the skull through the vertex without and with intravenous contrast.  Contrast: 50mL OMNIPAQUE IOHEXOL 300 MG/ML  SOLN  Comparison: 11/14/2012  Findings: No evidence of parenchymal hemorrhage or extra-axial fluid collection.  No mass lesion, mass effect, or midline shift.  No CT evidence of acute infarction.  Postsurgical changes related to prior right CP angle mass resection.  Associated 4.3 x 3.7 cm fluid collection overlying the craniectomy, possibly mildly decreased.  Surrounding thickened rim which enhances following contrast administration (series 3/image 5). Suspect communication with the skin surface (series 3/image 7).  While pseudomeningocele was raised as a possible etiology on the prior study, the clinical history  and associated findings on the current study are more suggestive of a postoperative abscess.   Cerebral volume is age appropriate.  No ventriculomegaly. The visualized paranasal sinuses are essentially clear.  Right mastoid effusion.  No evidence of calvarial fracture.  IMPRESSION: Postsurgical change related to prior right CP angle mass resection.  Associated 4.3 x 3.7 cm fluid collection/abscess overlying the craniectomy, as described above.   Original Report Authenticated By: Julian Hy, M.D.     Review of Systems  Constitutional: Negative for fever.  HENT: Positive for hearing loss.   Eyes: Positive for photophobia, discharge and redness.  Respiratory: Positive for wheezing.        S/p tracheostomy  Cardiovascular: Negative.   Gastrointestinal: Negative.   Genitourinary: Negative.   Musculoskeletal: Negative.   Skin: Negative.   Neurological: Positive for speech change and headaches.  Endo/Heme/Allergies: Negative.   Psychiatric/Behavioral: Negative.     Blood pressure 115/74, pulse 76, temperature 100.4 F (38 C), temperature source Oral, resp. rate 25, SpO2 96.00%. Physical Exam hent, right posterior occipital wound with redness, minimal drainage. Some tenderness. Neck tracheostomy in way of closure. Cv, nl. Lungs some loer rale. Abdomen,soft. Extremities drade 1 edema. NEURO right facial paralysis, keratitis. Strength, no weakness. Sensory noramal but some decrease of right face sensation. Dtr, nl. Ct head seen. Possibility of abscess or infected pseudo meningocele   Assessment/Plan  admission to [place]. Dr Christella Noa to see in am  Jaymi Tinner M 11/19/2012, 10:59 PM

## 2012-11-19 NOTE — ED Notes (Signed)
Family at bedside, update given on pt plan of care

## 2012-11-19 NOTE — ED Provider Notes (Signed)
History     CSN: [id number]  Arrival date & time 11/19/12  56   First MD Initiated Contact with Patient 11/19/12 1558     Chief Complaint  Patient presents with  . Headache  . 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 The patient was seen here on 11/14/12 for swelling around his surgical site. He was started on keflex which he continues to take. The patient states that his wound spontaneously drained a couple of days ago. Today the patient developed a fever. He states his temperature at home was 104. He states he is also having a headache but states he has had headaches almost daily since his surgery.   Past Medical History  Diagnosis Date  . CAD (coronary artery disease)     Cath 09, 99% LAD  . Cardiomyopathy, ischemic     Ischemic  . Obesity   . Fibromyalgia   . Shortness of breath   . CHF (congestive heart failure)   . Hypertension     dr Percival Spanish  . Cyst near coccyx   . Diabetes mellitus without complication     borderline  . Cyst near tailbone   . Kidney stones   . Myocardial infarction   . Headache     with lights    Past Surgical History  Procedure Laterality Date  . Coronary artery bypass graft      LIMA to LAD 2009  . Umbilical hernia repair    . Anal fistulectomy    . Retrosigmoid craniectomy for tumor resection Right 10/11/2012    Procedure: RETROSIGMOID CRANIECTOMY FOR TUMOR RESECTION;  Surgeon: [name], MD;  Location: [place] [facility];  Service: Neurosurgery;  Laterality: Right;  Craniotomy for acoustic neuroma  . Tracheostomy tube placement N/A 10/11/2012    Procedure: TRACHEOSTOMY;  Surgeon: Ascencion Dike, MD;  Location: [facility];  Service: ENT;  Laterality: N/A;  . Acoustic neuroma resection N/A 10/11/2012    Procedure: ACOUSTIC NEUROMA RESECTION;  Surgeon: Ascencion Dike, MD;  Location: [facility];  Service: ENT;  Laterality: N/A;    Family History  Problem  Relation Age of Onset  . CAD Father     History  Substance Use Topics  . Smoking status: Heavy Tobacco Smoker -- 0.50 packs/day for 20 years    Types: Cigarettes  . Smokeless tobacco: Current User     Comment: Rare tobacco.  "[name]"  . Alcohol Use: No    Review of Systems  Constitutional: Positive for fever and fatigue. Negative for chills.  HENT: Negative for congestion and rhinorrhea.   Eyes: Positive for redness.  Respiratory: Positive for cough.   Gastrointestinal: Negative for nausea, vomiting, abdominal pain, diarrhea and constipation.  Genitourinary: Positive for dysuria.  Skin: Positive for wound.  Neurological: Positive for headaches.  All other systems reviewed and are negative.   Allergies  Morphine and related and Other  Home Medications   No current outpatient prescriptions on file.  BP 126/54  Pulse 69  Temp(Src) 98.1 F (36.7 C) (Oral)  Resp 19  Ht 6' (1.829 m)  Wt 332 lb 10.8 oz (150.9 kg)  BMI 45.11 kg/m2  SpO2 98%  Physical Exam  Nursing note and vitals reviewed. Constitutional: He appears well-developed and well-nourished. No distress.  obese  HENT:  Head: Normocephalic and atraumatic.    Right Ear: A middle  ear effusion (exudative effusion) is present.  Left Ear: Tympanic membrane normal.  Eyes: Pupils are equal, round, and reactive to light. Right conjunctiva is injected. Left conjunctiva is not injected.  Patch over right eye with underlying injection.  Neck: Normal range of motion. Neck supple.  Cardiovascular: Normal rate and regular rhythm.  Exam reveals no gallop and no friction rub.   No murmur heard. Pulmonary/Chest: Effort normal and breath sounds normal.  Abdominal: Soft. He exhibits no distension. There is no tenderness.  Musculoskeletal: He exhibits edema (+2 LE edema bilaterally).  Neurological: He has normal strength. No sensory deficit. Gait normal. GCS eye subscore is 4. GCS verbal subscore is 5. GCS motor  subscore is 6.  Right sided facial droop (chronic post operatively)  Skin: Skin is warm and dry.  Stage II sacral ulcer with surrounding stage I ulcer  Psychiatric: He has a normal mood and affect.    ED Course  Procedures (including critical care time)  Labs Reviewed  CBC WITH DIFFERENTIAL - Abnormal; Notable for the following:    WBC 14.2 (*)    Neutro Abs 10.7 (*)    Lymphocytes Relative 11 (*)    Monocytes Absolute 1.6 (*)    All other components within normal limits  URINALYSIS, ROUTINE W REFLEX MICROSCOPIC - Abnormal; Notable for the following:    Hgb urine dipstick TRACE (*)    All other components within normal limits  POCT I-STAT, CHEM 8 - Abnormal; Notable for the following:    Sodium 134 (*)    Chloride 94 (*)    All other components within normal limits  MRSA PCR SCREENING  URINE CULTURE  CULTURE, BLOOD (ROUTINE X 2)  CULTURE, BLOOD (ROUTINE X 2)  ANAEROBIC CULTURE  WOUND CULTURE  URINE MICROSCOPIC-ADD ON  GLUCOSE, CAPILLARY  PROTIME-INR  APTT  GLUCOSE, CAPILLARY  GLUCOSE, CAPILLARY   Dg Chest 2 View  11/19/2012  *RADIOLOGY REPORT*  Clinical Data: Headache and fever  CHEST - 2 VIEW  Comparison: 10/28/2012  Findings: Mild cardiac enlargement.  The patient is status post median sternotomy and CABG procedure.  There is a small right pleural effusion.  Increased prominence of septal lines consistent with interstitial edema.  No airspace consolidation.  IMPRESSION:  1.  Mild CHF. 2.  No evidence for pneumonia.   Original Report Authenticated By: Kerby Moors, M.D.    Ct Head W Wo Contrast  11/19/2012  *RADIOLOGY REPORT*  Clinical Data: Recent surgery, headache, fever, evaluate for abscess  CT HEAD WITHOUT AND WITH CONTRAST  Technique:  Contiguous axial images were obtained from the base of the skull through the vertex without and with intravenous contrast.  Contrast: 32mL OMNIPAQUE IOHEXOL 300 MG/ML  SOLN  Comparison: 11/14/2012  Findings: No evidence of parenchymal  hemorrhage or extra-axial fluid collection.  No mass lesion, mass effect, or midline shift.  No CT evidence of acute infarction.  Postsurgical changes related to prior right CP angle mass resection.  Associated 4.3 x 3.7 cm fluid collection overlying the craniectomy, possibly mildly decreased.  Surrounding thickened rim which enhances following contrast administration (series 3/image 5). Suspect communication with the skin surface (series 3/image 7).  While pseudomeningocele was raised as a possible etiology on the prior study, the clinical history and associated findings on the current study are more suggestive of a postoperative abscess.  Cerebral volume is age appropriate.  No ventriculomegaly. The visualized paranasal sinuses are essentially clear.  Right mastoid effusion.  No evidence of calvarial fracture.  IMPRESSION:  Postsurgical change related to prior right CP angle mass resection.  Associated 4.3 x 3.7 cm fluid collection/abscess overlying the craniectomy, as described above.   Original Report Authenticated By: Julian Hy, M.D.    Dg Chest Port 1 View  11/20/2012  *RADIOLOGY REPORT*  Clinical Data: Preop for lumbar drainage placement.  CSF leak.  PORTABLE CHEST - 1 VIEW  Comparison: 11/19/2012.  Findings: The heart is mildly enlarged but stable.  The mediastinal and hilar contours are unchanged.  Persistent streaky bibasilar atelectasis.  No effusions or edema.  IMPRESSION:  1.  Cardiac enlargement, stable. 2.  Streaky bibasilar atelectasis.   Original Report Authenticated By: Marijo Sanes, M.D.    1. Abscess   2. Fever   3. 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 He has had headaches almost daily after his operation. No neck stiffness or meningitic signs on exam. CXR not c/w pneumonia. UA not c/w UTI. The patient has a middle ear effusion on the right side which may be new. He also  has a right eye conjunctivitis. Given recent surgery and swelling at the incision site there is concern for a wound infection. A CT head with contrast was obtained to further evaluate which revealed a possible abscess overlying his craniectomy site. Given close proximity to meninges, meningitic dosing of broad spectrum antibiotics given.  Delay in obtaining neurosurgical consult secondary to a problem with paging system. Neurosurgery saw and evaluated the patient and will admit for further workup and management.        Donita Brooks, MD 11/20/12 2155

## 2012-11-19 NOTE — ED Notes (Addendum)
Bringolf MD at bedside.

## 2012-11-19 NOTE — ED Notes (Signed)
Pt c/o fever today and all over headache starting since his surgery 10/11/12. Pt with poor repor, pt states his incision site busted on Sunday and again today. Pt denies fevers, chills, or stiffness in his neck.

## 2012-11-19 NOTE — ED Notes (Signed)
EMS reports pt's O2 sats were 92 on RAm placed on 2L [place], O2 sats increased to 98%

## 2012-11-19 NOTE — ED Notes (Signed)
Patient transported to X-ray 

## 2012-11-19 NOTE — ED Notes (Signed)
Per [facility], pt from home, pt c/o fever and headache. Pt recently had pressure tumors removed from bilateral neck. Family reports fever started this am w/a oral temp of 104, upon EMS arrival pt's tympanic temp was 99.4. Pt has drooping to (R) side w/slurred speech family reports this is pt's norm, stroke screen negative.

## 2012-11-19 NOTE — H&P (Signed)
[name] is an 52 y.o. male.   Chief Complaint: wound discharge HPI: second time the HP done. S/p right acoustic neuroma done by drs CABBELL/THEO several weeks ago f/u by tracheostomy. For several days he has some drianage and fever. Seen in the er last week he was given po abs. Now  He came back because increase of drainage and some fever. Ct heda done  Past Medical History  Diagnosis Date  . CAD (coronary artery disease)     Cath 09, 99% LAD  . Cardiomyopathy, ischemic     Ischemic  . Obesity   . Fibromyalgia   . Shortness of breath   . CHF (congestive heart failure)   . Hypertension     dr Percival Spanish  . Cyst near coccyx   . Diabetes mellitus without complication     borderline  . Cyst near tailbone   . Kidney stones   . Myocardial infarction   . Headache     with lights    Past Surgical History  Procedure Laterality Date  . Coronary artery bypass graft      LIMA to LAD 2009  . Umbilical hernia repair    . Anal fistulectomy    . Retrosigmoid craniectomy for tumor resection Right 10/11/2012    Procedure: RETROSIGMOID CRANIECTOMY FOR TUMOR RESECTION;  Surgeon: [name], MD;  Location: [place] [facility];  Service: Neurosurgery;  Laterality: Right;  Craniotomy for acoustic neuroma  . Tracheostomy tube placement N/A 10/11/2012    Procedure: TRACHEOSTOMY;  Surgeon: Ascencion Dike, MD;  Location: [facility];  Service: ENT;  Laterality: N/A;  . Acoustic neuroma resection N/A 10/11/2012    Procedure: ACOUSTIC NEUROMA RESECTION;  Surgeon: Ascencion Dike, MD;  Location: [facility];  Service: ENT;  Laterality: N/A;    Family History  Problem Relation Age of Onset  . CAD Father    Social History:  reports that he has been smoking Cigarettes.  He has a 10 pack-year smoking history. He uses smokeless tobacco. He reports that he does not drink alcohol or use illicit drugs.  Allergies:  Allergies  Allergen Reactions  . Morphine And Related Anaphylaxis    "makes me stop breathing"   . Other     Steroids makes hearts race     (Not in a hospital admission)  Results for orders placed during the hospital encounter of 11/19/12 (from the past 48 hour(s))  CBC WITH DIFFERENTIAL     Status: Abnormal   Collection Time    11/19/12  5:56 PM      Result Value Range   WBC 14.2 (*) 4.0 - 10.5 K/uL   RBC 4.82  4.22 - 5.81 MIL/uL   Hemoglobin 14.7  13.0 - 17.0 g/dL   HCT 44.6  39.0 - 52.0 %   MCV 92.5  78.0 - 100.0 fL   MCH 30.5  26.0 - 34.0 pg   MCHC 33.0  30.0 - 36.0 g/dL   RDW 14.1  11.5 - 15.5 %   Platelets 335  150 - 400 K/uL   Neutrophils Relative 75  43 - 77 %   Neutro Abs 10.7 (*) 1.7 - 7.7 K/uL   Lymphocytes Relative 11 (*) 12 - 46 %   Lymphs Abs 1.6  0.7 - 4.0 K/uL   Monocytes Relative 11  3 - 12 %   Monocytes Absolute 1.6 (*) 0.1 - 1.0 K/uL   Eosinophils Relative 1  0 - 5 %  Eosinophils Absolute 0.2  0.0 - 0.7 K/uL   Basophils Relative 1  0 - 1 %   Basophils Absolute 0.1  0.0 - 0.1 K/uL  POCT I-STAT, CHEM 8     Status: Abnormal   Collection Time    11/19/12  6:30 PM      Result Value Range   Sodium 134 (*) 135 - 145 mEq/L   Potassium 4.0  3.5 - 5.1 mEq/L   Chloride 94 (*) 96 - 112 mEq/L   BUN 8  6 - 23 mg/dL   Creatinine, Ser 0.80  0.50 - 1.35 mg/dL   Glucose, Bld 96  70 - 99 mg/dL   Calcium, Ion 1.20  1.12 - 1.23 mmol/L   TCO2 34  0 - 100 mmol/L   Hemoglobin 16.3  13.0 - 17.0 g/dL   HCT 48.0  39.0 - 52.0 %  URINALYSIS, ROUTINE W REFLEX MICROSCOPIC     Status: Abnormal   Collection Time    11/19/12 10:17 PM      Result Value Range   Color, Urine YELLOW  YELLOW   APPearance CLEAR  CLEAR   Specific Gravity, Urine 1.017  1.005 - 1.030   pH 6.0  5.0 - 8.0   Glucose, UA NEGATIVE  NEGATIVE mg/dL   Hgb urine dipstick TRACE (*) NEGATIVE   Bilirubin Urine NEGATIVE  NEGATIVE   Ketones, ur NEGATIVE  NEGATIVE mg/dL   Protein, ur NEGATIVE  NEGATIVE mg/dL   Urobilinogen, UA 0.2  0.0 - 1.0 mg/dL   Nitrite NEGATIVE  NEGATIVE   Leukocytes, UA NEGATIVE   NEGATIVE  URINE MICROSCOPIC-ADD ON     Status: None   Collection Time    11/19/12 10:17 PM      Result Value Range   Squamous Epithelial / LPF RARE  RARE   WBC, UA 0-2  <3 WBC/hpf   RBC / HPF 3-6  <3 RBC/hpf   Bacteria, UA RARE  RARE   Dg Chest 2 View  11/19/2012  *RADIOLOGY REPORT*  Clinical Data: Headache and fever  CHEST - 2 VIEW  Comparison: 10/28/2012  Findings: Mild cardiac enlargement.  The patient is status post median sternotomy and CABG procedure.  There is a small right pleural effusion.  Increased prominence of septal lines consistent with interstitial edema.  No airspace consolidation.  IMPRESSION:  1.  Mild CHF. 2.  No evidence for pneumonia.   Original Report Authenticated By: Kerby Moors, M.D.    Ct Head W Wo Contrast  11/19/2012  *RADIOLOGY REPORT*  Clinical Data: Recent surgery, headache, fever, evaluate for abscess  CT HEAD WITHOUT AND WITH CONTRAST  Technique:  Contiguous axial images were obtained from the base of the skull through the vertex without and with intravenous contrast.  Contrast: 59mL OMNIPAQUE IOHEXOL 300 MG/ML  SOLN  Comparison: 11/14/2012  Findings: No evidence of parenchymal hemorrhage or extra-axial fluid collection.  No mass lesion, mass effect, or midline shift.  No CT evidence of acute infarction.  Postsurgical changes related to prior right CP angle mass resection.  Associated 4.3 x 3.7 cm fluid collection overlying the craniectomy, possibly mildly decreased.  Surrounding thickened rim which enhances following contrast administration (series 3/image 5). Suspect communication with the skin surface (series 3/image 7).  While pseudomeningocele was raised as a possible etiology on the prior study, the clinical history and associated findings on the current study are more suggestive of a postoperative abscess.  Cerebral volume is age appropriate.  No ventriculomegaly. The visualized paranasal  sinuses are essentially clear.  Right mastoid effusion.  No evidence of  calvarial fracture.  IMPRESSION: Postsurgical change related to prior right CP angle mass resection.  Associated 4.3 x 3.7 cm fluid collection/abscess overlying the craniectomy, as described above.   Original Report Authenticated By: Julian Hy, M.D.     Review of Systems  Constitutional: Negative for fever.  HENT: Positive for hearing loss. Negative for nosebleeds.        Sp tracheostomy  Eyes: Positive for blurred vision, photophobia, discharge and redness.  Respiratory: Positive for wheezing.   Cardiovascular: Negative.   Gastrointestinal: Negative.   Genitourinary: Negative.   Musculoskeletal: Negative.   Skin: Positive for rash.  Neurological: Positive for sensory change, focal weakness and headaches.  Endo/Heme/Allergies: Negative.   Psychiatric/Behavioral: Negative.     Blood pressure 152/72, pulse 74, temperature 97.7 F (36.5 C), temperature source Oral, resp. rate 14, SpO2 96.00%. Physical Exam hent, tenderness in occipital wound with domwe redness and minimal drainage. Neck, tracheostomy in healing status. Cv, nl. Lungs some base rales.abdomen, soft, extremities grede 1 edema. NEURO KERATITIS RIGHE EYE, WITH FACIAL PALSY RIGHT SIDE. No weakness in extremities. Ct head seen . To r.o abscess vs infected pseudomenimgocele  Assessment/Plan Admission to [facility]. Dr Christella Noa to see in am .  Jahleel Stroschein M 11/19/2012, 11:27 PM

## 2012-11-19 NOTE — ED Notes (Signed)
Bringolf MD at bedside.

## 2012-11-19 NOTE — ED Notes (Signed)
Patient transported to CT 

## 2012-11-20 ENCOUNTER — Encounter (HOSPITAL_COMMUNITY)
Admission: EM | Disposition: A | Discharge status: Home / Self Care | Payer: Self-pay | Source: Home / Self Care | Attending: Neurosurgery

## 2012-11-20 ENCOUNTER — Inpatient Hospital Stay (HOSPITAL_COMMUNITY): Payer: BC Managed Care – PPO

## 2012-11-20 ENCOUNTER — Encounter (HOSPITAL_COMMUNITY): Payer: Self-pay | Admitting: Anesthesiology

## 2012-11-20 ENCOUNTER — Inpatient Hospital Stay (HOSPITAL_COMMUNITY): Payer: BC Managed Care – PPO | Admitting: Anesthesiology

## 2012-11-20 HISTORY — PX: CRANIOTOMY: SHX93

## 2012-11-20 HISTORY — PX: PLACEMENT OF LUMBAR DRAIN: SHX6028

## 2012-11-20 LAB — PROTIME-INR: INR: 1.16 (ref 0.00–1.49)

## 2012-11-20 LAB — GLUCOSE, CAPILLARY
Glucose-Capillary: 74 mg/dL (ref 70–99)
Glucose-Capillary: 67 mg/dL — ABNORMAL LOW (ref 70–99)
Glucose-Capillary: 74 mg/dL (ref 70–99)

## 2012-11-20 SURGERY — CRANIOTOMY REPAIR DURAL/CENTRAL SPINAL FLUID LEAK
Anesthesia: General | Site: Head | Laterality: Right | Wound class: Clean

## 2012-11-20 MED ORDER — ONDANSETRON HCL 4 MG PO TABS: 4.0000 mg | ORAL_TABLET | ORAL | Status: DC | PRN

## 2012-11-20 MED ORDER — ROCURONIUM BROMIDE 100 MG/10ML IV SOLN
INTRAVENOUS | Status: DC | PRN
  Administered 2012-11-20 (×2): 10 mg via INTRAVENOUS
  Administered 2012-11-20: 20 mg via INTRAVENOUS
  Administered 2012-11-20: 30 mg via INTRAVENOUS
  Administered 2012-11-20: 20 mg via INTRAVENOUS

## 2012-11-20 MED ORDER — VANCOMYCIN HCL IN DEXTROSE 1-5 GM/200ML-% IV SOLN
1000.0000 mg | Freq: Three times a day (TID) | INTRAVENOUS | Status: DC
  Administered 2012-11-21 – 2012-11-22 (×5): 1000 mg via INTRAVENOUS
  Filled 2012-11-20 (×7): qty 200

## 2012-11-20 MED ORDER — HYDROMORPHONE HCL PF 1 MG/ML IJ SOLN: 0.2500 mg | INTRAMUSCULAR | Status: DC | PRN

## 2012-11-20 MED ORDER — MAGNESIUM CITRATE PO SOLN: 1.0000 | Freq: Once | ORAL | Status: AC | PRN

## 2012-11-20 MED ORDER — BACITRACIN ZINC 500 UNIT/GM EX OINT
TOPICAL_OINTMENT | CUTANEOUS | Status: DC | PRN
  Administered 2012-11-20: 1 via TOPICAL

## 2012-11-20 MED ORDER — PROMETHAZINE HCL 12.5 MG PO TABS
12.5000 mg | ORAL_TABLET | ORAL | Status: DC | PRN
  Administered 2012-11-26: 12.5 mg via ORAL
  Filled 2012-11-20 (×2): qty 2

## 2012-11-20 MED ORDER — PHENYLEPHRINE HCL 10 MG/ML IJ SOLN
INTRAMUSCULAR | Status: DC | PRN
  Administered 2012-11-20 (×2): 40 ug via INTRAVENOUS

## 2012-11-20 MED ORDER — ONDANSETRON HCL 4 MG/2ML IJ SOLN
INTRAMUSCULAR | Status: DC | PRN
  Administered 2012-11-20: 4 mg via INTRAVENOUS

## 2012-11-20 MED ORDER — NEOSTIGMINE METHYLSULFATE 1 MG/ML IJ SOLN
INTRAMUSCULAR | Status: DC | PRN
  Administered 2012-11-20: 5 mg via INTRAVENOUS

## 2012-11-20 MED ORDER — ATENOLOL 50 MG PO TABS
50.0000 mg | ORAL_TABLET | Freq: Two times a day (BID) | ORAL | Status: DC
  Administered 2012-11-20 – 2012-12-01 (×23): 50 mg via ORAL
  Filled 2012-11-20 (×26): qty 1

## 2012-11-20 MED ORDER — SUCCINYLCHOLINE CHLORIDE 20 MG/ML IJ SOLN
INTRAMUSCULAR | Status: DC | PRN
  Administered 2012-11-20: 120 mg via INTRAVENOUS

## 2012-11-20 MED ORDER — FENTANYL CITRATE 0.05 MG/ML IJ SOLN
INTRAMUSCULAR | Status: DC | PRN
  Administered 2012-11-20 (×2): 50 ug via INTRAVENOUS
  Administered 2012-11-20: 100 ug via INTRAVENOUS
  Administered 2012-11-20: 50 ug via INTRAVENOUS

## 2012-11-20 MED ORDER — DEXTROSE 5 % IV SOLN: 2.0000 g | Freq: Once | INTRAVENOUS | Status: DC

## 2012-11-20 MED ORDER — ONDANSETRON HCL 4 MG/2ML IJ SOLN: 4.0000 mg | INTRAMUSCULAR | Status: DC | PRN

## 2012-11-20 MED ORDER — BISACODYL 5 MG PO TBEC: 5.0000 mg | DELAYED_RELEASE_TABLET | Freq: Every day | ORAL | Status: DC | PRN

## 2012-11-20 MED ORDER — MICROFIBRILLAR COLL HEMOSTAT EX PADS
MEDICATED_PAD | CUTANEOUS | Status: DC | PRN
  Administered 2012-11-20: 1 via TOPICAL

## 2012-11-20 MED ORDER — SENNA 8.6 MG PO TABS
1.0000 | ORAL_TABLET | Freq: Two times a day (BID) | ORAL | Status: DC
  Administered 2012-11-21 (×3): 8.6 mg via ORAL
  Filled 2012-11-20 (×5): qty 1

## 2012-11-20 MED ORDER — TRAZODONE HCL 50 MG PO TABS
50.0000 mg | ORAL_TABLET | Freq: Every day | ORAL | Status: DC
  Administered 2012-11-20 – 2012-11-30 (×11): 50 mg via ORAL
  Filled 2012-11-20 (×14): qty 1

## 2012-11-20 MED ORDER — ARTIFICIAL TEARS OP OINT
TOPICAL_OINTMENT | OPHTHALMIC | Status: DC | PRN
  Administered 2012-11-20: 1 via OPHTHALMIC

## 2012-11-20 MED ORDER — THROMBIN 20000 UNITS EX KIT
PACK | CUTANEOUS | Status: DC | PRN
  Administered 2012-11-20: 21:00:00 via TOPICAL

## 2012-11-20 MED ORDER — NYSTATIN 100000 UNIT/GM EX POWD
Freq: Two times a day (BID) | CUTANEOUS | Status: DC
  Administered 2012-11-21 – 2012-12-01 (×22): via TOPICAL
  Filled 2012-11-20 (×2): qty 15

## 2012-11-20 MED ORDER — CLINDAMYCIN PHOSPHATE 900 MG/50ML IV SOLN
900.0000 mg | Freq: Three times a day (TID) | INTRAVENOUS | Status: DC
  Administered 2012-11-21 – 2012-11-28 (×23): 900 mg via INTRAVENOUS
  Filled 2012-11-20 (×29): qty 50

## 2012-11-20 MED ORDER — POTASSIUM CHLORIDE IN NACL 20-0.9 MEQ/L-% IV SOLN
INTRAVENOUS | Status: DC
  Administered 2012-11-21: via INTRAVENOUS
  Filled 2012-11-20 (×5): qty 1000

## 2012-11-20 MED ORDER — GLYCOPYRROLATE 0.2 MG/ML IJ SOLN
INTRAMUSCULAR | Status: DC | PRN
  Administered 2012-11-20: .7 mg via INTRAVENOUS
  Administered 2012-11-20: .2 mg via INTRAVENOUS

## 2012-11-20 MED ORDER — VANCOMYCIN HCL IN DEXTROSE 1-5 GM/200ML-% IV SOLN: 1000.0000 mg | Freq: Once | INTRAVENOUS | Status: DC

## 2012-11-20 MED ORDER — LIDOCAINE HCL 4 % MT SOLN
OROMUCOSAL | Status: DC | PRN
  Administered 2012-11-20: 4 mL via TOPICAL

## 2012-11-20 MED ORDER — SODIUM CHLORIDE 0.9 % IV SOLN
INTRAVENOUS | Status: DC | PRN
  Administered 2012-11-20 (×2): via INTRAVENOUS

## 2012-11-20 MED ORDER — OXYCODONE HCL 5 MG PO TABS: 5.0000 mg | ORAL_TABLET | Freq: Once | ORAL | Status: DC | PRN

## 2012-11-20 MED ORDER — DEXTROSE 5 % IV SOLN
2.0000 g | Freq: Two times a day (BID) | INTRAVENOUS | Status: DC
  Administered 2012-11-21 – 2012-12-01 (×21): 2 g via INTRAVENOUS
  Filled 2012-11-20 (×22): qty 2

## 2012-11-20 MED ORDER — EPHEDRINE SULFATE 50 MG/ML IJ SOLN
INTRAMUSCULAR | Status: DC | PRN
  Administered 2012-11-20 (×3): 5 mg via INTRAVENOUS

## 2012-11-20 MED ORDER — LABETALOL HCL 5 MG/ML IV SOLN: 10.0000 mg | INTRAVENOUS | Status: DC | PRN

## 2012-11-20 MED ORDER — NALOXONE HCL 0.4 MG/ML IJ SOLN: 0.0800 mg | INTRAMUSCULAR | Status: DC | PRN

## 2012-11-20 MED ORDER — VANCOMYCIN HCL 10 G IV SOLR
2500.0000 mg | Freq: Once | INTRAVENOUS | Status: AC
  Administered 2012-11-21: 2500 mg via INTRAVENOUS
  Filled 2012-11-20: qty 2500

## 2012-11-20 MED ORDER — PROPOFOL 10 MG/ML IV BOLUS
INTRAVENOUS | Status: DC | PRN
  Administered 2012-11-20: 150 mg via INTRAVENOUS

## 2012-11-20 MED ORDER — PROMETHAZINE HCL 25 MG/ML IJ SOLN: 6.2500 mg | INTRAMUSCULAR | Status: DC | PRN

## 2012-11-20 MED ORDER — SENNOSIDES-DOCUSATE SODIUM 8.6-50 MG PO TABS: 1.0000 | ORAL_TABLET | Freq: Every evening | ORAL | Status: DC | PRN

## 2012-11-20 MED ORDER — OXYCODONE HCL 5 MG/5ML PO SOLN: 5.0000 mg | Freq: Once | ORAL | Status: DC | PRN

## 2012-11-20 MED ORDER — 0.9 % SODIUM CHLORIDE (POUR BTL) OPTIME
TOPICAL | Status: DC | PRN
  Administered 2012-11-20 (×2): 1000 mL

## 2012-11-20 SURGICAL SUPPLY — 70 items
BANDAGE ADHESIVE 1X3 (GAUZE/BANDAGES/DRESSINGS) ×3 IMPLANT
BANDAGE GAUZE ELAST BULKY 4 IN (GAUZE/BANDAGES/DRESSINGS) IMPLANT
BENZOIN TINCTURE PRP APPL 2/3 (GAUZE/BANDAGES/DRESSINGS) IMPLANT
BLADE SURG ROTATE 9660 (MISCELLANEOUS) ×3 IMPLANT
BRUSH SCRUB EZ 1% IODOPHOR (MISCELLANEOUS) IMPLANT
BUR ACORN 6.0 PRECISION (BURR) IMPLANT
BUR ADDG 1.1 (BURR) IMPLANT
BUR MATCHSTICK NEURO 3.0 LAGG (BURR) IMPLANT
BUR ROUTER D-58 CRANI (BURR) IMPLANT
CANISTER SUCTION 2500CC (MISCELLANEOUS) ×3 IMPLANT
CLIP TI MEDIUM 6 (CLIP) IMPLANT
CLOTH BEACON ORANGE TIMEOUT ST (SAFETY) ×3 IMPLANT
CONT SPEC 4OZ CLIKSEAL STRL BL (MISCELLANEOUS) ×6 IMPLANT
CORDS BIPOLAR (ELECTRODE) ×3 IMPLANT
DRAIN SNY WOU 7FLT (WOUND CARE) IMPLANT
DRAIN SUBARACHNOID (WOUND CARE) IMPLANT
DRAPE NEUROLOGICAL W/INCISE (DRAPES) IMPLANT
DRAPE SURG 17X23 STRL (DRAPES) ×3 IMPLANT
DRAPE WARM FLUID 44X44 (DRAPE) ×3 IMPLANT
DRESSING TELFA 8X3 (GAUZE/BANDAGES/DRESSINGS) ×3 IMPLANT
DURAGUARD 04CMX04CM ×3 IMPLANT
DURAPREP 26ML APPLICATOR (WOUND CARE) ×3 IMPLANT
DURAPREP 6ML APPLICATOR 50/CS (WOUND CARE) ×3 IMPLANT
DURASEAL SPINE SEALANT 3ML (MISCELLANEOUS) ×3 IMPLANT
ELECT CAUTERY BLADE 6.4 (BLADE) ×3 IMPLANT
ELECT REM PT RETURN 9FT ADLT (ELECTROSURGICAL) ×3
ELECTRODE REM PT RTRN 9FT ADLT (ELECTROSURGICAL) ×2 IMPLANT
EVACUATOR 1/8 PVC DRAIN (DRAIN) IMPLANT
EVACUATOR SILICONE 100CC (DRAIN) IMPLANT
GAUZE SPONGE 4X4 16PLY XRAY LF (GAUZE/BANDAGES/DRESSINGS) ×3 IMPLANT
GLOVE BIO SURGEON STRL SZ 6.5 (GLOVE) ×6 IMPLANT
GLOVE BIO SURGEON STRL SZ7 (GLOVE) ×9 IMPLANT
GLOVE ECLIPSE 6.5 STRL STRAW (GLOVE) ×9 IMPLANT
GLOVE ECLIPSE 7.5 STRL STRAW (GLOVE) IMPLANT
GLOVE EXAM NITRILE LRG STRL (GLOVE) IMPLANT
GLOVE EXAM NITRILE MD LF STRL (GLOVE) ×3 IMPLANT
GLOVE EXAM NITRILE XL STR (GLOVE) IMPLANT
GLOVE EXAM NITRILE XS STR PU (GLOVE) IMPLANT
GOWN BRE IMP SLV AUR LG STRL (GOWN DISPOSABLE) ×12 IMPLANT
GOWN BRE IMP SLV AUR XL STRL (GOWN DISPOSABLE) IMPLANT
GOWN STRL REIN 2XL LVL4 (GOWN DISPOSABLE) IMPLANT
HEMOSTAT SURGICEL 2X14 (HEMOSTASIS) IMPLANT
KIT BASIN OR (CUSTOM PROCEDURE TRAY) ×3 IMPLANT
KIT ROOM TURNOVER OR (KITS) ×3 IMPLANT
NEEDLE HYPO 25X1 1.5 SAFETY (NEEDLE) ×3 IMPLANT
NS IRRIG 1000ML POUR BTL (IV SOLUTION) ×6 IMPLANT
PACK CRANIOTOMY (CUSTOM PROCEDURE TRAY) ×3 IMPLANT
PATTIES SURGICAL .5 X.5 (GAUZE/BANDAGES/DRESSINGS) IMPLANT
PATTIES SURGICAL .5 X3 (DISPOSABLE) IMPLANT
PATTIES SURGICAL 1X1 (DISPOSABLE) IMPLANT
SPONGE GAUZE 4X4 12PLY (GAUZE/BANDAGES/DRESSINGS) ×3 IMPLANT
SPONGE NEURO XRAY DETECT 1X3 (DISPOSABLE) IMPLANT
SPONGE SURGIFOAM ABS GEL 100 (HEMOSTASIS) ×3 IMPLANT
STAPLER VISISTAT 35W (STAPLE) ×3 IMPLANT
SUT ETHILON 3 0 FSL (SUTURE) IMPLANT
SUT ETHILON 3 0 PS 1 (SUTURE) IMPLANT
SUT NURALON 4 0 TR CR/8 (SUTURE) ×9 IMPLANT
SUT PL GUT 3 0 FS 1 (SUTURE) ×3 IMPLANT
SUT STEEL 0 (SUTURE)
SUT STEEL 0 18XMFL TIE 17 (SUTURE) IMPLANT
SUT VIC AB 2-0 CT2 18 VCP726D (SUTURE) ×6 IMPLANT
SYR 20ML ECCENTRIC (SYRINGE) ×3 IMPLANT
SYR CONTROL 10ML LL (SYRINGE) ×3 IMPLANT
TAPE CLOTH SURG 4X10 WHT LF (GAUZE/BANDAGES/DRESSINGS) ×3 IMPLANT
TOWEL OR 17X24 6PK STRL BLUE (TOWEL DISPOSABLE) ×3 IMPLANT
TOWEL OR 17X26 10 PK STRL BLUE (TOWEL DISPOSABLE) ×6 IMPLANT
TRAY FOLEY CATH 14FRSI W/METER (CATHETERS) ×3 IMPLANT
TUBE CONNECTING 12X1/4 (SUCTIONS) ×3 IMPLANT
UNDERPAD 30X30 INCONTINENT (UNDERPADS AND DIAPERS) ×3 IMPLANT
WATER STERILE IRR 1000ML POUR (IV SOLUTION) ×3 IMPLANT

## 2012-11-20 NOTE — Anesthesia Postprocedure Evaluation (Signed)
Anesthesia Post Note  Patient: [name]  Procedure(s) Performed: Procedure(s) (LRB): CRANIOTOMY REPAIR DURAL/CENTRAL SPINAL FLUID LEAK (Right) Attempted PLACEMENT OF LUMBAR DRAIN (N/A)  Anesthesia type: general  Patient location: PACU  Post pain: Pain level controlled  Post assessment: Patient's Cardiovascular Status Stable  Last Vitals:  Filed Vitals:   11/20/12 2230  BP: 106/41  Pulse: 70  Temp:   Resp: 20    Post vital signs: Reviewed and stable  Level of consciousness: sedated  Complications: No apparent anesthesia complications

## 2012-11-20 NOTE — Progress Notes (Signed)
Patient ID: [name], male   DOB: 13-Jan-1961, 52 y.o.   MRN: [id number] BP 127/52  Pulse 71  Temp(Src) 97.8 F (36.6 C) (Oral)  Resp 19  Ht 6' (1.829 m)  Wt 150.2 kg (331 lb 2.1 oz)  BMI 44.9 kg/m2  SpO2 99% Mr. Prasse was readmitted last night for a possible wound infection. He had undergone a head ct last week which I reviewed. 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 Some fluid leaked from the wound this past weekend according to his wife which was clear, yellowish, and non purulent.  He is alert, oriented x 4 and moving all extremities.  He has a right 7th nerve palsy, which has not improved.  He has agreed to have a wound exploration, and lumbar drain placed for the presumed leak. Risks and benefits, including but not limited to bleeding, infection, continued fluid leak, were explained.

## 2012-11-20 NOTE — Preoperative (Addendum)
Beta Blockers   Reason not to administer Beta Blockers:atenolol 11/20/12 0934

## 2012-11-20 NOTE — Transfer of Care (Signed)
Immediate Anesthesia Transfer of Care Note  Patient: [name]  Procedure(s) Performed: Procedure(s): CRANIOTOMY REPAIR DURAL/CENTRAL SPINAL FLUID LEAK (Right) Attempted PLACEMENT OF LUMBAR DRAIN (N/A)  Patient Location: NICU  Anesthesia Type:General  Level of Consciousness: awake, alert , oriented and patient cooperative  Airway & Oxygen Therapy: Pt. placed on CPAP by RT  Post-op Assessment: Post -op Vital signs reviewed and stable and Patient moving all extremities X 4  Post vital signs: Reviewed and stable  Complications: No apparent anesthesia complications

## 2012-11-20 NOTE — Anesthesia Procedure Notes (Signed)
Procedure Name: Intubation Date/Time: 11/20/2012 7:27 PM Performed by: Hollie Salk Z Pre-anesthesia Checklist: Patient identified, Timeout performed, Emergency Drugs available, Suction available and Patient being monitored Patient Re-evaluated:Patient Re-evaluated prior to inductionOxygen Delivery Method: Circle system utilized Preoxygenation: Pre-oxygenation with 100% oxygen Intubation Type: IV induction Laryngoscope Size: Mac and 4 Grade View: Grade I Tube type: Oral Tube size: 7.5 mm Number of attempts: 1 Airway Equipment and Method: Stylet Placement Confirmation: ETT inserted through vocal cords under direct vision,  breath sounds checked- equal and bilateral and positive ETCO2 Secured at: 23 cm Tube secured with: Tape Dental Injury: Teeth and Oropharynx as per pre-operative assessment

## 2012-11-20 NOTE — Progress Notes (Signed)
Patient ID: [name], male   DOB: 22-Sep-1960, 52 y.o.   MRN: [id number] Stable. Dr Christella Noa to see [patient and decide about  Next step. He is to see him today. In the meantime he is NPO.

## 2012-11-20 NOTE — Plan of Care (Signed)
Problem: Mobility Goal: Mobility level is maintained or improved Outcome: Progressing Ambulates with assistance to [facility]

## 2012-11-20 NOTE — Progress Notes (Signed)
Reported to dr. Tobias Alexander pt cbg 59. He asked me to give pt orange juice and recheck.

## 2012-11-20 NOTE — Progress Notes (Signed)
[facility]  Patient Status: Active (receiving services up to time of hospitalization)  [facility] is providing the following services: RN, PT, OT, ST and MSW  If patient discharges after hours, please call [phone].   [name] 11/20/2012, 11:16 AM

## 2012-11-20 NOTE — Op Note (Signed)
11/19/2012 - 11/20/2012  10:01 PM  PATIENT:  [name]  52 y.o. 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 Due to the wife stating the wound was leaking fluid this weekend I will take him to the operating room for possible repair of the leak.   PRE-OPERATIVE DIAGNOSIS:  csf leak, Psuedomeningocele  POST-OPERATIVE DIAGNOSIS:  csf leak, Psudeomeningocele  PROCEDURE:  Procedure(s): CRANIOTOMY REPAIR DURAL/CENTRAL SPINAL FLUID LEAK Attempted PLACEMENT OF LUMBAR DRAIN  SURGEON:  Surgeon(s): [name], MD  ASSISTANTS:none  ANESTHESIA:   general  EBL:  Total I/O In: 1000 [I.V.:1000] Out: 290 [Urine:260; Blood:30]  BLOOD ADMINISTERED:none  CELL SAVER GIVEN:none   COUNT:per nursing  DRAINS: none   SPECIMEN:  Source of Specimen:  right craniectomy wound  DICTATION: [name] was taken to the operating room, intubated and placed under a general anesthetic. A foley catheter was placed under sterile conditions without difficulty. He was positioned in the left lateral decubitus position. An axillary role was placed and he was secured to the table with tape and belts. I prepped his lumbar region and attempted a lumbar drain. I was unable to place the drain as the Tuohy needle was not long enough to pierce the dura.  I then shaved and prepped his head exposing the acoustic neuroma incision. [name] was prepped and draped in a sterile manner. I opened the incision with a 10 blade and under some pressure clear to golden fluid at body temperature exited the skin opening. There was no purulence, the tissue bled easily in the wound. I irrigated the wound with 2 liters of saline. I took cultures anaerobic and aerobic, before the irrigation. I placed some bovine pericardium around the edges of the previous dural closure. The closure was pulsatile and I was unable to see an opening where spinal fluid was leaking. Nevertheless I used dura seal over the wound once  more. I closed the wound with vertical interrupted mattress sutures. I placed a sterile dressing. He was moved to the ICU bed and extubated.   PLAN OF CARE: Admit to inpatient   PATIENT DISPOSITION:  PACU - hemodynamically stable.   Delay start of Pharmacological VTE agent (>24hrs) due to surgical blood loss or risk of bleeding:  yes

## 2012-11-20 NOTE — Addendum Note (Signed)
Addendum created 11/20/12 2336 by Zorita Pang, CRNA   Modules edited: Anesthesia LDA

## 2012-11-20 NOTE — Progress Notes (Signed)
ANTIBIOTIC CONSULT NOTE - INITIAL  Pharmacy Consult for vancomycin  Indication: Wound infection   Allergies  Allergen Reactions  . Morphine And Related Anaphylaxis    "makes me stop breathing"  . Other     Steroids makes hearts race    Patient Measurements: Height: 6' (182.9 cm) Weight: 332 lb 10.8 oz (150.9 kg) IBW/kg (Calculated) : 77.6  Vital Signs: Temp: 98.3 F (36.8 C) (04/02 2314) Temp src: Oral (04/02 1745) BP: 124/40 mmHg (04/02 2314) Pulse Rate: 87 (04/02 2314) Intake/Output from previous day: 04/01 0701 - 04/02 0700 In: 2000 [I.V.:2000] Out: 850 [Urine:850] Intake/Output from this shift: Total I/O In: 1320 [P.O.:120; I.V.:1200] Out: 360 [Urine:330; Blood:30]  Labs:  Recent Labs  11/19/12 1756 11/19/12 1830  WBC 14.2*  --   HGB 14.7 16.3  PLT 335  --   CREATININE  --  0.80   Estimated Creatinine Clearance: 165.2 ml/min (by C-G formula based on Cr of 0.8). No results found for this basename: VANCOTROUGH, Corlis Leak, VANCORANDOM, GENTTROUGH, GENTPEAK, GENTRANDOM, TOBRATROUGH, TOBRAPEAK, TOBRARND, AMIKACINPEAK, AMIKACINTROU, AMIKACIN,  in the last 72 hours   Microbiology: Recent Results (from the past 720 hour(s))  URINE CULTURE     Status: None   Collection Time    10/23/12  2:36 AM      Result Value Range Status   Specimen Description URINE, CLEAN CATCH   Final   Special Requests Normal   Final   Culture  Setup Time 10/23/2012 03:01   Final   Colony Count NO GROWTH   Final   Culture NO GROWTH   Final   Report Status 10/24/2012 FINAL   Final  CULTURE, EXPECTORATED SPUTUM-ASSESSMENT     Status: None   Collection Time    10/26/12 12:18 AM      Result Value Range Status   Specimen Description SPUTUM   Final   Special Requests NONE   Final   Sputum evaluation     Final   Value: THIS SPECIMEN IS ACCEPTABLE. RESPIRATORY CULTURE REPORT TO FOLLOW.   Report Status 10/26/2012 FINAL   Final  CULTURE, RESPIRATORY (NON-EXPECTORATED)     Status: None   Collection Time    10/26/12 12:18 AM      Result Value Range Status   Specimen Description SPUTUM   Final   Special Requests NONE   Final   Gram Stain     Final   Value: ABUNDANT WBC PRESENT, PREDOMINANTLY PMN     FEW SQUAMOUS EPITHELIAL CELLS PRESENT     MODERATE GRAM POSITIVE COCCI IN PAIRS     IN CLUSTERS FEW GRAM POSITIVE RODS     FEW GRAM NEGATIVE RODS   Culture NORMAL OROPHARYNGEAL FLORA   Final   Report Status 10/28/2012 FINAL   Final  CULTURE, RESPIRATORY (NON-EXPECTORATED)     Status: None   Collection Time    10/28/12  5:11 PM      Result Value Range Status   Specimen Description TRACHEAL ASPIRATE   Final   Special Requests NONE   Final   Gram Stain     Final   Value: RARE WBC PRESENT, PREDOMINANTLY PMN     NO SQUAMOUS EPITHELIAL CELLS SEEN     FEW GRAM NEGATIVE RODS     FEW GRAM POSITIVE RODS     FEW GRAM POSITIVE COCCI IN PAIRS   Culture Non-Pathogenic Oropharyngeal-type Flora Isolated.   Final   Report Status 10/31/2012 FINAL   Final  MRSA PCR SCREENING  Status: None   Collection Time    11/20/12 12:56 AM      Result Value Range Status   MRSA by PCR NEGATIVE  NEGATIVE Final   Comment:            The GeneXpert MRSA Assay (FDA     approved for NASAL specimens     only), is one component of a     comprehensive MRSA colonization     surveillance program. It is not     intended to diagnose MRSA     infection nor to guide or     monitor treatment for     MRSA infections.    Medical History: Past Medical History  Diagnosis Date  . CAD (coronary artery disease)     Cath 09, 99% LAD  . Cardiomyopathy, ischemic     Ischemic  . Obesity   . Fibromyalgia   . Shortness of breath   . CHF (congestive heart failure)   . Hypertension     dr Percival Spanish  . Cyst near coccyx   . Diabetes mellitus without complication     borderline  . Cyst near tailbone   . Kidney stones   . Myocardial infarction   . Headache     with lights    Medications:  Scheduled:  .  antiseptic oral rinse  15 mL Mouth Rinse QHS  . artificial tears  1 application Right Eye QHS  . atenolol  50 mg Oral BID  . [START ON 11/21/2012] cefTRIAXone (ROCEPHIN)  IV  2 g Intravenous Q12H  . chlorhexidine  15 mL Mouth/Throat BID  . [COMPLETED] clindamycin (CLEOCIN) IV  900 mg Intravenous Once  . clindamycin (CLEOCIN) IV  900 mg Intravenous Q8H  . colchicine  0.6 mg Oral Daily  . guaiFENesin  1,200 mg Oral BID  . nystatin   Topical BID  . senna  1 tablet Oral BID  . tamsulosin  0.4 mg Oral QPC supper  . traZODone  50 mg Oral QHS  . [DISCONTINUED] atenolol  50 mg Oral BID  . [DISCONTINUED] cefTRIAXone (ROCEPHIN)  IV  2 g Intravenous Once  . [DISCONTINUED] traZODone  50 mg Oral QHS  . [DISCONTINUED] vancomycin  1,000 mg Intravenous Once   Assessment: 52 yo male with h/o right posterior fossa craniectomy ~ 5 weeks ago was re-admitted on 4/1 with complaint of low-grade fever and some wound drainage. Patient is now s/p craniotomy repair dural/central spinal fluid leak. Pharmacy to manage vancomycin. Patient is also to receive clindamycin and ceftriaxone.   Goal of Therapy:  Vancomycin trough level 10-15 mcg/ml  Plan:  1. Vancomycin 2.5gm IV x 1, then 1gm IV Q8H.  Otila Back 11/20/2012,11:31 PM

## 2012-11-20 NOTE — Anesthesia Preprocedure Evaluation (Addendum)
Anesthesia Evaluation    Reviewed: Allergy & Precautions, H&P , NPO status , Patient's Chart, lab work & pertinent test results  Airway Mallampati: III TM Distance: >3 FB Neck ROM: Full    Dental  (+) Poor Dentition and Dental Advisory Given   Pulmonary shortness of breath and with exertion, COPD COPD inhaler, Current Smoker,  Pt s/p trach 10/11/12 for crani by Dr Benjamine Mola. Decannulated 10/21/12. Stoma closed 10/29/12.   11/19/12 CXR shows small right pleural effusion, mild CHF   + decreased breath sounds      Cardiovascular hypertension, Pt. on medications and Pt. on home beta blockers + CAD, + Past MI (2009), + CABG and +CHF (presently has BLE edema) PERIPHERAL VASCULAR DISEASE: 2009.  09/18/12 Echo Dobutamine stress test Stress results:   Maximal heart rate during stress was 151bpm (89% of maximal predicted heart rate). The maximal predicted heart rate was 169bpm.The target heart rate was achieved. The heart rate response to stress was normal. There was resting hypertension. Abnormal blood pressure response to dobutamine. The rate-pressure product for the peak heart rate and blood pressure was 27522mm Hg/min. Stress induced chest pain which resolved spontaneously.  ------------------------------------------------------------ Stress ECG:   The stress ECG was negative for ischemia.   ------------------------------------------------------------ Baseline:  - LV size was normal. - The estimated LV ejection fraction was 55%. - Normal wall motion; no LV regional wall motion   abnormalities. Low dose:  - The estimated LV ejection fraction was 65%. - Normal wall motion; no LV regional wall motion   abnormalities. Peak stress:  - The estimated LV ejection fraction was 75%. - Normal wall motion; no LV regional wall motion   abnormalities. Recovery:  - The estimated LV ejection fraction was 70%. - Normal wall motion; no LV regional wall  motion   abnormalities.     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 Pt admit for redness and drainage around crani site--has been on abx since 11/13/12.   CT scan 11/19/12 shows fluid collection vs abscess at crani site.   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; pt does have balance issues since crami) negative psych ROS   GI/Hepatic negative GI ROS, Neg liver ROS,   Endo/Other  diabetes (diet control), Type 2Morbid obesity  Renal/GU  Bladder dysfunction      Musculoskeletal  (+) Fibromyalgia -  Abdominal   Peds  Hematology negative hematology ROS (+)   Anesthesia Other Findings   Reproductive/Obstetrics                      Anesthesia Physical Anesthesia Plan  ASA: III  Anesthesia Plan: General   Post-op Pain Management:    Induction: Intravenous  Airway Management Planned: Oral ETT  Additional Equipment:   Intra-op Plan:   Post-operative Plan: Possible Post-op intubation/ventilation  Informed Consent: I have reviewed the patients History and Physical, chart, labs and discussed the procedure including the risks, benefits and alternatives for the proposed anesthesia with the patient or authorized representative who has indicated his/her understanding and acceptance.   Dental advisory given  Plan Discussed with: CRNA, Anesthesiologist and Surgeon  Anesthesia Plan Comments: (Pt admit 4/1 with severe headache, redness, swelling, drainage from crani incision (on abx since 11/13/12). 10/11/12 underwent right post fossa crani for acoustic neuroma and elective trach. PMHX sign for CAD, MI 2009, CABG 2009, isch CM, CHF, HTN, DM, SOB, morbid obesity, heavy smoker, and fibromyalgia. Pt  has hearing loss in right ear and significant right facial palsey r/t crani; as well as poor balance and coordination. Lurline Idol was decannulated 10/21/12, stoma closed 10/29/12. CXR 11/19/12  significant for small right pleural effusion and mild CHF. CT scan 11/19/12 ? Fluid leak vs abcess at crani incision.)      Anesthesia Quick Evaluation

## 2012-11-20 NOTE — ED Provider Notes (Signed)
I saw and evaluated the patient, reviewed the resident's note and I agree with the findings and plan.  Pt awake, alert, NAD, area of redness and tenderness overlying right scalp behind ear.  Right eye prurulent conjunctivitis  Threasa Beards, MD 11/20/12 2157

## 2012-11-21 ENCOUNTER — Encounter (HOSPITAL_COMMUNITY): Payer: Self-pay | Admitting: Neurosurgery

## 2012-11-21 LAB — URINE CULTURE
Colony Count: NO GROWTH
Culture: NO GROWTH

## 2012-11-21 MED ORDER — BENEPROTEIN PO POWD
1.0000 | Freq: Three times a day (TID) | ORAL | Status: DC
  Administered 2012-11-21 – 2012-11-28 (×22): 6 g via ORAL
  Filled 2012-11-21 (×2): qty 227

## 2012-11-21 MED ORDER — ENSURE PUDDING PO PUDG
1.0000 | Freq: Three times a day (TID) | ORAL | Status: DC
  Administered 2012-11-21 – 2012-11-30 (×27): 1 via ORAL

## 2012-11-21 NOTE — Progress Notes (Signed)
Patient ID: [name], male   DOB: Nov 23, 1960, 53 y.o.   MRN: [id number] BP 111/48  Pulse 66  Temp(Src) 98.3 F (36.8 C) (Oral)  Resp 17  Ht 6' (1.829 m)  Wt 150.9 kg (332 lb 10.8 oz)  BMI 45.11 kg/m2  SpO2 97% Alert and oriented x 4 Wound with serous drainage Moving all extremities.

## 2012-11-21 NOTE — Progress Notes (Signed)
The patient had his pseudomeningocele removed by Dr. Cyndy Freeze yesterday. The patient did not see the oculoplastic surgeon on Monday due to his acute illness/fever.  His schedule to see Dr. Lorina Rabon has been rescheduled to April 14. He is otherwise in good spirit. He continues to have weakness of his right facial muscles. His trach stoma was previously closed. He is voicing well.

## 2012-11-21 NOTE — Progress Notes (Signed)
INITIAL NUTRITION ASSESSMENT  DOCUMENTATION CODES Per approved criteria  -Morbid Obesity   INTERVENTION: 1. Magic cup TID 2. Ensure Pudding TID mixed with 1 scoop Beneprotein   NUTRITION DIAGNOSIS: Increased nutrient needs related to wound healing as evidenced by estimated needs.   Goal: Pt to meet >/= 90% of their estimated nutrition needs.   Monitor:  PO intake, weight trend, labs, supplement acceptance  Reason for Assessment: Rounds  52 y.o. 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 Trach d/c'ed 3/3. Pt admitted to rehab 3/4 and was d/c'ed to home 3/25.   Pt with CSF leak and psuedomeningocele. Pt with unsuccessful lumbar drain placement 4/2.  Pt followed closely by RD at rehab was on multiple oral nutrition supplements. Extensive diet education provided at that time, per RD diet compliance was expected to be poor.  Per pt he does not like the Dysphagia 2 diet and hopes to progress to Dysphagia 3 diet soon. Pt willing to do Magic cup and Ensure Pudding but does not like Prostat.   Height: Ht Readings from Last 1 Encounters:  11/20/12 6' (1.829 m)    Weight: Wt Readings from Last 1 Encounters:  11/20/12 332 lb 10.8 oz (150.9 kg)    Ideal Body Weight: 80.9 kg  % Ideal Body Weight: 186%  Wt Readings from Last 10 Encounters:  11/20/12 332 lb 10.8 oz (150.9 kg)  11/20/12 332 lb 10.8 oz (150.9 kg)  11/07/12 352 lb 4.7 oz (159.8 kg)  10/21/12 330 lb 0.5 oz (149.7 kg)  10/21/12 330 lb 0.5 oz (149.7 kg)  10/04/12 361 lb 12.8 oz (164.111 kg)  09/09/12 425 lb (192.779 kg)    Usual Body Weight: >300 lb  % Usual Body Weight: > 100%  BMI:  Body mass index is 45.11 kg/(m^2).  Obesity Class III  Estimated Nutritional Needs: Kcal: 2300-2500 Protein: 115-140 grams Fluid: > 2.3 L/day  Skin: multiple incisions, stage II wound on thigh and DTI on buttocks  Diet Order: Dysphagia 2 with Thin  Liquids Meal Completion: <25%   EDUCATION NEEDS: -No education needs identified at this time   Intake/Output Summary (Last 24 hours) at 11/21/12 1113 Last data filed at 11/21/12 1012  Gross per 24 hour  Intake   3116 ml  Output   1785 ml  Net   1331 ml    Last BM: PTA   Labs:   Recent Labs Lab 11/19/12 1830  NA 134*  K 4.0  CL 94*  BUN 8  CREATININE 0.80  GLUCOSE 96    CBG (last 3)   Recent Labs  11/20/12 2219 11/20/12 2222 11/20/12 2308  GLUCAP 67* 67* 74    Scheduled Meds: . antiseptic oral rinse  15 mL Mouth Rinse QHS  . artificial tears  1 application Right Eye QHS  . atenolol  50 mg Oral BID  . cefTRIAXone (ROCEPHIN)  IV  2 g Intravenous Q12H  . chlorhexidine  15 mL Mouth/Throat BID  . clindamycin (CLEOCIN) IV  900 mg Intravenous Q8H  . colchicine  0.6 mg Oral Daily  . guaiFENesin  1,200 mg Oral BID  . nystatin   Topical BID  . senna  1 tablet Oral BID  . tamsulosin  0.4 mg Oral QPC supper  . traZODone  50 mg Oral QHS  . vancomycin  1,000 mg Intravenous Q8H    Continuous Infusions: . 0.9 % NaCl with KCl 20 mEq / L  80 mL/hr at 11/21/12 1000    Past Medical History  Diagnosis Date  . CAD (coronary artery disease)     Cath 09, 99% LAD  . Cardiomyopathy, ischemic     Ischemic  . Obesity   . Fibromyalgia   . Shortness of breath   . CHF (congestive heart failure)   . Hypertension     dr Percival Spanish  . Cyst near coccyx   . Diabetes mellitus without complication     borderline  . Cyst near tailbone   . Kidney stones   . Myocardial infarction   . Headache     with lights    Past Surgical History  Procedure Laterality Date  . Coronary artery bypass graft      LIMA to LAD 2009  . Umbilical hernia repair    . Anal fistulectomy    . Retrosigmoid craniectomy for tumor resection Right 10/11/2012    Procedure: RETROSIGMOID CRANIECTOMY FOR TUMOR RESECTION;  Surgeon: [name], MD;  Location: [place] [facility];  Service: Neurosurgery;   Laterality: Right;  Craniotomy for acoustic neuroma  . Tracheostomy tube placement N/A 10/11/2012    Procedure: TRACHEOSTOMY;  Surgeon: Ascencion Dike, MD;  Location: [place];  Service: ENT;  Laterality: N/A;  . Acoustic neuroma resection N/A 10/11/2012    Procedure: ACOUSTIC NEUROMA RESECTION;  Surgeon: Ascencion Dike, MD;  Location: [place] [facility] [facility];  Service: ENT;  Laterality: N/A;    Maylon Peppers RD, [place], [place] Pager [phone] After Hours Pager

## 2012-11-22 LAB — CULTURE, BLOOD (ROUTINE X 2)

## 2012-11-22 LAB — GLUCOSE, CAPILLARY: Glucose-Capillary: 174 mg/dL — ABNORMAL HIGH (ref 70–99)

## 2012-11-22 LAB — VANCOMYCIN, TROUGH: Vancomycin Tr: 18.9 ug/mL (ref 10.0–20.0)

## 2012-11-22 MED ORDER — DIAZEPAM 5 MG/ML IJ SOLN
5.0000 mg | Freq: Once | INTRAMUSCULAR | Status: DC
  Filled 2012-11-22: qty 2

## 2012-11-22 MED ORDER — DIAZEPAM 5 MG/ML IJ SOLN
INTRAMUSCULAR | Status: AC
  Administered 2012-11-22: 5 mg
  Filled 2012-11-22: qty 2

## 2012-11-22 NOTE — Progress Notes (Signed)
ANTIBIOTIC CONSULT NOTE - FOLLOW UP  Pharmacy Consult for Vancomycin Indication: Wound infection  Allergies  Allergen Reactions  . Morphine And Related Anaphylaxis    "makes me stop breathing"  . Other     Steroids makes hearts race    Patient Measurements: Height: 6' (182.9 cm) Weight: 332 lb 10.8 oz (150.9 kg) IBW/kg (Calculated) : 77.6  Vital Signs: Temp: 97.8 F (36.6 C) (04/04 1548) Temp src: Axillary (04/04 1548) BP: 121/57 mmHg (04/04 1620) Pulse Rate: 65 (04/04 1620) Intake/Output from previous day: 04/03 0701 - 04/04 0700 In: 3650 [P.O.:960; I.V.:1840; IV Piggyback:850] Out: 1951 [id number]; Stool:1] Intake/Output from this shift: Total I/O In: 1500 [P.O.:480; I.V.:720; IV Piggyback:300] Out: 350 [Urine:350]  Labs:  Recent Labs  11/19/12 1756 11/19/12 1830  WBC 14.2*  --   HGB 14.7 16.3  PLT 335  --   CREATININE  --  0.80   Estimated Creatinine Clearance: 165.2 ml/min (by C-G formula based on Cr of 0.8).  Recent Labs  11/22/12 1630  Arabi 18.9    Assessment: 52 yo male with h/o right posterior fossa craniectomy ~ 5 weeks ago was re-admitted on 4/1 with complaint of low-grade fever and some wound drainage. Patient is now s/p craniotomy repair dural/central spinal fluid leak. WBC is elevated, patient was hypothermic 4/3, Scr has been stable and UOP os ok. Vanc trough is therapeutic.  4/3 Vanc>> 4/3 Clinda>> 4/3 Rocephin>>  4/2: wound>> NGTD 4/1: Blood>> 1/2 CoNS  Goal of Therapy:  Vancomycin trough level 15-20 mcg/ml  Plan:  - Continue Vancomycin 1gm IV q 8h - Please address LOT with Vancomycin since cultures have not grown out MRSA. - Will monitor cx/spec/sens, renal fn and clinical status daily.  Thanks, Benjamin Casanas K. Posey Pronto, PharmD, BCPS.  Clinical Pharmacist Pager [phone]. 11/22/2012 5:21 PM

## 2012-11-22 NOTE — Progress Notes (Signed)
Patient ID: [name], male   DOB: 1960-08-23, 52 y.o.   MRN: [id number] BP 130/52  Pulse 77  Temp(Src) 98.1 F (36.7 C) (Oral)  Resp 18  Ht 6' (1.829 m)  Wt 150.9 kg (332 lb 10.8 oz)  BMI 45.11 kg/m2  SpO2 100% Alert and afebrile Speech is dysarthric, complains of diplopia. Unable to fully characterize on exam Right 7th palsy Still had some fluid leaking from wound, I placed 2 more sutures to close area where I saw the leak. Pt, ot ordered. Continue abx

## 2012-11-23 NOTE — Evaluation (Signed)
Occupational Therapy Evaluation Patient Details Name: [name] MRN: [id number] DOB: [birth year] Today's Date: 11/23/2012 Time: [id number] OT Time Calculation (min): 38 min  OT Assessment / Plan / Recommendation Clinical Impression  52 yo male admitted for possible wound infection. Pt recently d/c from CIR s/p  Rt posterior fossa craniectomy fo resection of an acoustic neuroma aboug 5 weeks ago by MD [facility]. Pt with tracheostomy at that time. Pt now with trach removed and admitted to pseudomeningocele. Ot to follow acutely. Recommend CIR for d/c planning     OT Assessment  Patient needs continued OT Services    Follow Up Recommendations  CIR    Barriers to Discharge      Equipment Recommendations  None recommended by OT    Recommendations for Other Services Rehab consult  Frequency  Min 3X/week    Precautions / Restrictions Precautions Precautions: Fall Precaution Comments: dizziness with mobility, deaf R ear, blind R eye, double vision, open stoma Restrictions Weight Bearing Restrictions: No   Pertinent Vitals/Pain Itching at the bandage site on spine     ADL  Grooming: Set up Where Assessed - Grooming: Supported sitting Upper Body Bathing: Chest;Right arm;Left arm;Moderate assistance (body habitus) Where Assessed - Upper Body Bathing: Supported sitting Lower Body Bathing: +1 Total assistance Where Assessed - Lower Body Bathing: Supported sit to stand Upper Body Dressing: Minimal assistance Where Assessed - Upper Body Dressing: Supported sitting Lower Body Dressing: +1 Total assistance Where Assessed - Lower Body Dressing: Supported sit to Lobbyist: Minimal Print production planner Method: Sit to Loss adjuster, chartered: Extra wide drop arm bedside commode Toileting - Clothing Manipulation and Hygiene: +1 Total assistance Where Assessed - Toileting Clothing Manipulation and Hygiene: Sit to stand from 3-in-1 or toilet Equipment Used:  Rolling walker (bariatric) Transfers/Ambulation Related to ADLs: Pt ambulated short ~5-8 ft during sesison and pt fatigued with attempt. Pt expresses frustration by decr ambulation this admission. Pt states "i was doing this in rehab" ADL Comments: Pt supine on arrival and agreeable to OOB. pt expressed desire to return to CIR and wanted the CIR therapist that he worked with previously to be notified that he is back at [facility]. Pt required (A) for supine <> sit reaching with BIL UE. Pt with skin reddness noted on spine from previous needle sites. RN called to room. RN Mable Fill recommends to remove bandage due to reddness from tape of bandage. Pt completed sit<>Stand to bed side commode. pt with very small loose stool ( size of 50 cent piece). Pt with wounds on buttock noted . Pt with new dressing applied to bil buttock wounds and positioned in chair. Pt with chair alarm. Pt continously discussing topics not related to the therapy session and very fixated on providing the message of the lord. Pt expressed depress/ frustration over return and new surgery at [facility]    OT Diagnosis: Generalized weakness;Cognitive deficits  OT Problem List: Decreased strength;Decreased activity tolerance;Impaired balance (sitting and/or standing);Impaired vision/perception;Decreased cognition;Decreased safety awareness;Pain OT Treatment Interventions: Self-care/ADL training;Neuromuscular education;Energy conservation;DME and/or AE instruction;Therapeutic activities;Cognitive remediation/compensation;Visual/perceptual remediation/compensation;Patient/family education;Balance training   OT Goals Acute Rehab OT Goals OT Goal Formulation: With patient Time For Goal Achievement: 12/07/12 Potential to Achieve Goals: Good ADL Goals Pt Will Perform Grooming: with supervision;Standing at sink;Unsupported ADL Goal: Grooming - Progress: Goal set today Pt Will Perform Upper Body Bathing: with supervision;Sit to stand from chair ADL Goal:  Upper Body Bathing - Progress: Goal set today Pt Will Perform Upper Body Dressing: with  supervision;Sit to stand from chair ADL Goal: Upper Body Dressing - Progress: Goal set today Pt Will Transfer to Toilet: with supervision;Ambulation;Extra wide 3-in-1 ADL Goal: Toilet Transfer - Progress: Goal set today Pt Will Perform Toileting - Clothing Manipulation: with modified independence;Sitting on 3-in-1 or toilet ADL Goal: Toileting - Clothing Manipulation - Progress: Goal set today Pt Will Perform Toileting - Hygiene: with modified independence;Sit to stand from 3-in-1/toilet ADL Goal: Toileting - Hygiene - Progress: Goal set today Miscellaneous OT Goals Miscellaneous OT Goal #1: Pt will tolerate 15 minutes of continuous activity  OT Goal: Miscellaneous Goal #1 - Progress: Goal set today  Visit Information  Last OT Received On: 11/23/12 Assistance Needed: +1 PT/OT Co-Evaluation/Treatment: Yes    Subjective Data  Subjective: "I have had two dreams let me tell you about them..."- pt goes into detail about dreams and speaking of heaven / god Patient Stated Goal: to return home to [employer] business   Prior [place] Lives With: Spouse Available Help at Discharge: Family Type of Home: Mobile home Home Access: Ramped entrance Home Layout: One level Bathroom Shower/Tub: Chiropodist: Standard Bathroom Accessibility: Yes How Accessible: Accessible via walker Home Adaptive Equipment: Walker - rolling Prior Function Level of Independence: Independent with assistive device(s) Able to Take Stairs?: No Driving: No (not since admission in 09-2012) Vocation: On disability Comments: Pt with recent admission to [facility] 09-2012 which lead to CIR and recent d/c home Communication Communication: Other (comment) (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) Dominant Hand: Right         Vision/Perception Vision - History Baseline Vision: Other (comment)  (diplopia) Visual History: Other (comment) (diplopia and droop to right eye lid) Patient Visual Report: Diplopia Vision - Assessment Eye Alignment: Impaired (comment) Additional Comments: pt with eye patch and gauze over Rt eye   Cognition  Cognition Overall Cognitive Status: Impaired Area of Impairment: Safety/judgement Arousal/Alertness: Awake/alert Orientation Level: Appears intact for tasks assessed;Oriented X4 / Intact Behavior During Session: [facility] for tasks performed    Extremity/Trunk Assessment Right Upper Extremity Assessment RUE ROM/Strength/Tone: WFL for tasks assessed RUE Sensation: WFL - Light Touch;WFL - Proprioception Left Upper Extremity Assessment LUE ROM/Strength/Tone: WFL for tasks assessed LUE Sensation: Deficits LUE Sensation Deficits: ulnar deviation and sensation deficits Right Lower Extremity Assessment RLE ROM/Strength/Tone: WFL for tasks assessed RLE Sensation: WFL - Light Touch;WFL - Proprioception RLE Coordination: WFL - gross motor Left Lower Extremity Assessment LLE ROM/Strength/Tone: WFL for tasks assessed LLE Sensation: WFL - Light Touch;WFL - Proprioception LLE Coordination: WFL - gross motor     Mobility Bed Mobility Bed Mobility: Supine to Sit;Sitting - Scoot to Edge of Bed Supine to Sit: 3: Mod assist Sitting - Scoot to Edge of Bed: 4: Min assist Details for Bed Mobility Assistance: Mod assist to pull to sit  Transfers Sit to Stand: 4: Min guard Stand to Sit: 4: Min guard     Exercise     Balance Static Sitting Balance Static Sitting - Balance Support: Feet supported Static Sitting - Level of Assistance: 6: Modified independent (Device/Increase time) Dynamic Sitting Balance Dynamic Sitting - Level of Assistance: 6: Modified independent (Device/Increase time) Static Standing Balance Static Standing - Balance Support: During functional activity;Bilateral upper extremity supported Static Standing - Level of Assistance: 4: Min assist  (to hold RW steady in place)   End of Session OT - End of Session Activity Tolerance: Patient tolerated treatment well Patient left: in chair;with call bell/phone within reach;with chair  alarm set Nurse Communication: Mobility status;Precautions (pt educated to call RN in 2 hours for pressure relief)  GO     Sharol Harness [facility] 11/23/2012, 11:53 AM Pager: [phone]

## 2012-11-23 NOTE — Progress Notes (Signed)
Afebrile. Vital stable. Still with some slight drainage from his incision. Exam otherwise stable. Continue antibiotics and head elevation. If drainage continues then plan lumbar drain on Monday.

## 2012-11-23 NOTE — Evaluation (Signed)
Physical Therapy Evaluation Patient Details Name: [name] MRN: [id number] DOB: [birth year] Today's Date: 11/23/2012 Time: [id number] PT Time Calculation (min): 40 min  PT Assessment / Plan / Recommendation Clinical Impression  Pt is a 52 y.o. male s/p pseudomeningocele removal. Patient demonstrats some deficits in functional mobility secondary to sensory deficits, body habitus, weakness, and deconditioning. Pt will benefit from skilled PT to address deficits and maximize functionl.     PT Assessment  Patient needs continued PT services    Follow Up Recommendations  CIR    Does the patient have the potential to tolerate intense rehabilitation      Barriers to Discharge        Equipment Recommendations  None recommended by PT    Recommendations for Other Services Rehab consult   Frequency Min 3X/week    Precautions / Restrictions Precautions Precautions: Fall Precaution Comments: dizziness with mobility, deaf R ear, blind R eye, double vision, open stoma Restrictions Weight Bearing Restrictions: No   Pertinent Vitals/Pain No pain at this time       Mobility  Bed Mobility Bed Mobility: Supine to Sit;Sitting - Scoot to Edge of Bed Supine to Sit: 3: Mod assist Sitting - Scoot to Edge of Bed: 4: Min assist Details for Bed Mobility Assistance: Mod assist to pull to sit  Transfers Transfers: Sit to Stand;Stand to Sit Sit to Stand: 4: Min guard Stand to Sit: 4: Min guard Ambulation/Gait Ambulation/Gait Assistance: 4: Min guard Ambulation Distance (Feet): 8 Feet Assistive device: Rolling walker Ambulation/Gait Assistance Details: VCs for upright posture and proper positioning within rolling walker Gait Pattern: Decreased stride length;Trunk flexed;Wide base of support Gait velocity: decreased Stairs: No Wheelchair Mobility Wheelchair Mobility: No    Exercises  ankle pumps, both, 20; long arc quads, both, 10   PT Diagnosis: Difficulty walking;Generalized  weakness  PT Problem List: Decreased range of motion;Decreased activity tolerance;Decreased mobility;Decreased knowledge of use of DME;Decreased safety awareness;Impaired sensation;Obesity PT Treatment Interventions: DME instruction;Gait training;Stair training;Functional mobility training;Therapeutic activities;Therapeutic exercise;Balance training;Patient/family education   PT Goals Acute Rehab PT Goals PT Goal Formulation: With patient Time For Goal Achievement: 11/30/12 Potential to Achieve Goals: Good Pt will go Supine/Side to Sit: with supervision PT Goal: Supine/Side to Sit - Progress: Goal set today Pt will go Sit to Supine/Side: with supervision PT Goal: Sit to Supine/Side - Progress: Goal set today Pt will go Sit to Stand: with supervision PT Goal: Sit to Stand - Progress: Goal set today Pt will go Stand to Sit: with supervision PT Goal: Stand to Sit - Progress: Goal set today Pt will Ambulate: >150 feet;with supervision PT Goal: Ambulate - Progress: Goal set today  Visit Information  Last PT Received On: 11/23/12 Assistance Needed: +1    Subjective Data  Subjective: "I had a dream about six snakes and 2 roses" Patient Stated Goal: to start to get better stability   Prior Functioning  Home Living Lives With: Spouse Available Help at Discharge: Family Type of Home: Mobile home Home Access: Ramped entrance Home Layout: One level Bathroom Shower/Tub: Chiropodist: Standard Bathroom Accessibility: Yes How Accessible: Accessible via walker Home Adaptive Equipment: Walker - rolling Prior Function Level of Independence: Independent with assistive device(s) Dominant Hand: Right    Cognition  Cognition Overall Cognitive Status: Impaired Area of Impairment: Safety/judgement Arousal/Alertness: Awake/alert Orientation Level: Appears intact for tasks assessed;Oriented X4 / Intact Behavior During Session: [facility] for tasks performed    Extremity/Trunk  Assessment Right Upper Extremity Assessment RUE ROM/Strength/Tone:  WFL for tasks assessed RUE Sensation: WFL - Light Touch;WFL - Proprioception Left Upper Extremity Assessment LUE ROM/Strength/Tone: WFL for tasks assessed LUE Sensation: Deficits LUE Sensation Deficits: ulnar deviation and sensation deficits Right Lower Extremity Assessment RLE ROM/Strength/Tone: WFL for tasks assessed RLE Sensation: WFL - Light Touch;WFL - Proprioception RLE Coordination: WFL - gross motor Left Lower Extremity Assessment LLE ROM/Strength/Tone: WFL for tasks assessed LLE Sensation: WFL - Light Touch;WFL - Proprioception LLE Coordination: WFL - gross motor   Balance Static Sitting Balance Static Sitting - Balance Support: Feet supported Static Sitting - Level of Assistance: 6: Modified independent (Device/Increase time) Dynamic Sitting Balance Dynamic Sitting - Level of Assistance: 6: Modified independent (Device/Increase time) Static Standing Balance Static Standing - Balance Support: During functional activity;Bilateral upper extremity supported Static Standing - Level of Assistance: 4: Min assist (to hold RW steady in place)  End of Session PT - End of Session Activity Tolerance: Patient tolerated treatment well;Patient limited by fatigue Patient left: in chair;with call bell/phone within reach;with chair alarm set Nurse Communication: Mobility status;Other (comment) (skin rash from bandages on back)  GP     Duncan Dull 11/23/2012, [place], PT DPT  [phone]

## 2012-11-24 NOTE — Progress Notes (Signed)
Patient ID: [name], male   DOB: February 28, 1961, 52 y.o.   MRN: [id number] Stable, less drainage. Off and on headache. Asking about diplopia and need of surgery

## 2012-11-25 LAB — ANAEROBIC CULTURE

## 2012-11-25 NOTE — Progress Notes (Signed)
Physical Therapy Treatment Patient Details Name: [name] MRN: [id number] DOB: [birth year] Today's Date: 11/25/2012 Time: 1330-1410 PT Time Calculation (min): 40 min  PT Assessment / Plan / Recommendation Comments on Treatment Session  Pt is 52 yo male s/p craniotomy who is progressing with mobility but remains limited by fatigue. HR 76 bpm after ambulation with O2 sats 95% on RA. Pt slow to process verbally and physically. PT willcontinue to follow.    Follow Up Recommendations  CIR     Does the patient have the potential to tolerate intense rehabilitation     Barriers to Discharge        Equipment Recommendations  None recommended by PT    Recommendations for Other Services Rehab consult  Frequency Min 3X/week   Plan Discharge plan remains appropriate;Frequency remains appropriate    Precautions / Restrictions Precautions Precautions: Fall Precaution Comments:  deaf R ear, blind R eye, double vision, open stoma Restrictions Weight Bearing Restrictions: No   Pertinent Vitals/Pain No c/o pain    Mobility  Bed Mobility Bed Mobility: Supine to Sit;Sit to Supine Supine to Sit: 5: Supervision Sitting - Scoot to Edge of Bed: 6: Modified independent (Device/Increase time) Sit to Supine: 5: Supervision Details for Bed Mobility Assistance: rail needed to get to EOB but not assist from therapist Transfers Transfers: Sit to Stand;Stand to Sit Sit to Stand: 4: Min guard;From toilet;From bed Stand to Sit: 4: Min guard;To toilet;To bed Details for Transfer Assistance: vc's for hand placement first time but then pt placed hands correctly on his own Ambulation/Gait Ambulation/Gait Assistance: 4: Min guard Ambulation Distance (Feet): 75 Feet (60', 8', 7') Assistive device: Rolling walker Ambulation/Gait Assistance Details: cues for upright posture, self monitoring for need to take a break, 1 standing rest break Gait Pattern: Decreased stride length;Trunk flexed;Wide base of  support Gait velocity: decreased Stairs: No Wheelchair Mobility Wheelchair Mobility: No    Exercises     PT Diagnosis:    PT Problem List:   PT Treatment Interventions:     PT Goals Acute Rehab PT Goals PT Goal Formulation: With patient Time For Goal Achievement: 11/30/12 Potential to Achieve Goals: Good Pt will go Supine/Side to Sit: with modified independence PT Goal: Supine/Side to Sit - Progress: Updated due to goal met Pt will go Sit to Supine/Side: with supervision PT Goal: Sit to Supine/Side - Progress: Revised due to lack of progress Pt will go Sit to Stand: with supervision PT Goal: Sit to Stand - Progress: Progressing toward goal Pt will go Stand to Sit: with supervision PT Goal: Stand to Sit - Progress: Progressing toward goal Pt will Ambulate: >150 feet;with supervision PT Goal: Ambulate - Progress: Progressing toward goal  Visit Information  Last PT Received On: 11/25/12 Assistance Needed: +1    Subjective Data  Subjective: I hate the food here Patient Stated Goal: to start to get better stability   Cognition  Cognition Overall Cognitive Status: Impaired Area of Impairment: Other (comment) Arousal/Alertness: Lethargic Orientation Level: Oriented X4 / Intact Behavior During Session: WFL for tasks performed Cognition - Other Comments: very slow to process and to progress with activity, needs lots of time between activities and stalls mobility    Balance  Balance Balance Assessed: Yes Static Standing Balance Static Standing - Balance Support: During functional activity;Left upper extremity supported Static Standing - Level of Assistance: 5: Stand by assistance Dynamic Standing Balance Dynamic Standing - Level of Assistance: 5: Stand by assistance;4: Min assist  End of Session PT -  End of Session Equipment Utilized During Treatment: Gait belt Activity Tolerance: Patient tolerated treatment well;Patient limited by fatigue Patient left: in bed;with call  bell/phone within reach Nurse Communication: Mobility status   GP   Leighton Roach, [place]  [place], [place] 11/25/2012, 2:27 PM

## 2012-11-25 NOTE — Progress Notes (Signed)
Patient ID: [name], male   DOB: 1961/07/31, 52 y.o.   MRN: [id number] Wound still draining. Will absolutely need a lumbar drain. We will have to contact the manufacturer of the drain kits to obtain an extra long needle to place the drain.

## 2012-11-25 NOTE — Progress Notes (Signed)
Rehab Admissions Coordinator Note:  Patient was screened by Cleatrice Burke for appropriateness for an Inpatient Acute Rehab Consult.  At this time, we are recommending Inpatient Rehab consult per PT and OT recommendations.  Cleatrice Burke 11/25/2012, 12:57 PM  I can be reached at [phone].

## 2012-11-25 NOTE — Progress Notes (Signed)
Occupational Therapy Treatment Patient Details Name: [name] MRN: [id number] DOB: 1960/09/09 Today's Date: 11/25/2012 Time: [id number] OT Time Calculation (min): 44 min  OT Assessment / Plan / Recommendation Comments on Treatment Session Pt making progress and should continue with acute OT services to maximize level of function and safety. Pt demonstrates decreased endurance/safety during ADL/functional mobility and requires extended rest periods    Follow Up Recommendations  CIR    Barriers to Discharge       Equipment Recommendations  None recommended by OT    Recommendations for Other Services Rehab consult  Frequency Min 3X/week   Plan Discharge plan remains appropriate    Precautions / Restrictions Precautions Precautions: Fall Precaution Comments:  deaf R ear, blind R eye, double vision, open stoma Restrictions Weight Bearing Restrictions: No   Pertinent Vitals/Pain     ADL  Grooming: Performed;Wash/dry hands;Wash/dry face;Supervision/safety;Min guard;Set up Where Assessed - Grooming: Supported standing Transfers/Ambulation Related to ADLs: Cues to pace self during ADL mobility, fatigues easily ADL Comments: Pt required increased/extended amount of time to complete toileting tasks. Pt states that he feels like he has to go, sits a while and then does nothing. Pt ambulated back to EOB and after approx 41minutes stated that he felt like he needed to return to the toilet    OT Diagnosis:    OT Problem List:   OT Treatment Interventions:     OT Goals ADL Goals ADL Goal: Grooming - Progress: Progressing toward goals ADL Goal: Toilet Transfer - Progress: Progressing toward goals ADL Goal: Toileting - Clothing Manipulation - Progress: Progressing toward goals ADL Goal: Toileting - Hygiene - Progress: Progressing toward goals Miscellaneous OT Goals OT Goal: Miscellaneous Goal #1 - Progress: Progressing toward goals  Visit Information  Last OT Received On:  11/25/12    Subjective Data  Subjective: " I see 1 1/2 - 2 of everything in my R eye " Patient Stated Goal: To return to rehab and then home   Prior Functioning       Cognition  Cognition Overall Cognitive Status: Appears within functional limits for tasks assessed/performed Orientation Level: Appears intact for tasks assessed;Oriented X4 / Intact Behavior During Session: [facility] for tasks performed    Mobility  Bed Mobility Bed Mobility: Supine to Sit;Sitting - Scoot to Edge of Bed Supine to Sit: 4: Min guard Sitting - Scoot to [employer] of Bed: 5: Supervision Transfers Transfers: Sit to Stand;Stand to Sit Sit to Stand: 4: Min guard Stand to Sit: 4: Min guard    Exercises   Pt sat EOB x 5 minutes for functional tasks   Balance Balance Balance Assessed: Yes Dynamic Standing Balance Dynamic Standing - Level of Assistance: 5: Stand by assistance;4: Min assist   End of Session OT - End of Session Activity Tolerance: Patient tolerated treatment well Patient left: with nursing in room;Other (comment) (in bathroom)  GO     [name] 11/25/2012, 1:37 PM

## 2012-11-26 LAB — CULTURE, BLOOD (ROUTINE X 2)

## 2012-11-26 NOTE — Progress Notes (Signed)
Occupational Therapy Treatment Patient Details Name: [name] MRN: [id number] DOB: [birth year] Today's Date: 11/26/2012 Time: 1025-1100 OT Time Calculation (min): 35 min  OT Assessment / Plan / Recommendation Comments on Treatment Session Adapted glasses for partial occlusion of R central visual field to address vertical diplopia. After taping, pt with significant reduction of diplopia. will see again this am to review use of glasses and begin occular ROM exercises. Pt to wear glasses at all times with the exception of sleep and exercises. Pt should sleep with eye patch over R eye.    Follow Up Recommendations  Outpatient OT;Other (comment)    Barriers to Discharge  None    Equipment Recommendations  None recommended by OT    Recommendations for Other Services    Frequency Min 3X/week   Plan Discharge plan remains appropriate    Precautions / Restrictions Precautions Precautions: Fall Precaution Comments: vestibular and visual impairments   Pertinent Vitals/Pain no apparent distress     ADL  Eating/Feeding: Other (comment) (dys thin liquids) Upper Body Bathing: Set up Lower Body Bathing: Minimal assistance Upper Body Dressing: Set up Lower Body Dressing: Minimal assistance Toilet Transfer: Min guard Toilet Transfer Method: Sit to stand Toileting - Clothing Manipulation and Hygiene: Min guard ADL Comments: Pt seen for treatment using spot occlusion. glasses taped with 36M tape over central visual field R eye to reduce vertical diplopia. PT reports glasses significanly reduce diplopia. Pt to wear glasses at all times with the exception of exercise and sleep.     OT Diagnosis: Disturbance of vision;Other (comment) (balance)  OT Problem List: Impaired vision/perception;Impaired balance (sitting and/or standing);Decreased knowledge of use of DME or AE;Obesity OT Treatment Interventions: Self-care/ADL training;Neuromuscular education;Therapeutic exercise;Balance  training;Patient/family education;Visual/perceptual remediation/compensation;Therapeutic activities   OT Goals Acute Rehab OT Goals OT Goal Formulation: With patient Time For Goal Achievement: 12/10/12 Potential to Achieve Goals: Good ADL Goals Pt Will Transfer to Toilet: with supervision;Ambulation;Extra wide 3-in-1 ADL Goal: Toilet Transfer - Progress: Progressing toward goals Miscellaneous OT Goals Miscellaneous OT Goal #2: Pt will independetly use occlusion glasses during ADL task with set up for ADL OT Goal: Miscellaneous Goal #2 - Progress: Progressing toward goals Miscellaneous OT Goal #3: Pt will independetly complete written eye exercise program. OT Goal: Miscellaneous Goal #3 - Progress: Goal set today  Visit Information  Last OT Received On: 11/26/12 Assistance Needed: +1    Subjective Data      Prior Functioning  Home Living Lives With: Spouse Available Help at Discharge: Family Type of Home: Mobile home Home Access: Ramped entrance Home Layout: One level Bathroom Shower/Tub: Chiropodist: Standard Bathroom Accessibility: Yes How Accessible: Accessible via walker Home Adaptive Equipment: Walker - rolling Prior Function Level of Independence: Independent with assistive device(s) Able to Take Stairs?: No Driving: No Vocation: On disability    Cognition  Cognition Overall Cognitive Status: No family/caregiver present to determine baseline cognitive functioning    Mobility  Bed Mobility Bed Mobility: Supine to Sit;Sit to Supine Supine to Sit: 6: Modified independent (Device/Increase time) Sitting - Scoot to Edge of Bed: 6: Modified independent (Device/Increase time) Transfers Sit to Stand: 4: Min guard Stand to Sit: 4: Min guard    Exercises      Balance     End of Session OT - End of Session Activity Tolerance: Patient tolerated treatment well Patient left: in chair;with call bell/phone within reach Nurse Communication:  Mobility status;Other (comment)  GO     Kamori Barbier,HILLARY 11/26/2012, 5:09 PM Deidre Ala  [place], OTR/L  [phone] 11/26/2012

## 2012-11-26 NOTE — Progress Notes (Signed)
Patient ID: [name], male   DOB: 1960/10/10, 52 y.o.   MRN: [id number] BP 145/84  Pulse 80  Temp(Src) 99.1 F (37.3 C) (Oral)  Resp 20  Ht 6' (1.829 m)  Wt 150.9 kg (332 lb 10.8 oz)  BMI 45.11 kg/m2  SpO2 97% Alert and oriented Speech is dysarthric, easily understandable Moving all extremities Awaiting longer tuohy to place lumbar drain

## 2012-11-26 NOTE — Progress Notes (Signed)
Physical Therapy Treatment Patient Details Name: [name] MRN: [id number] DOB: 1960-11-10 Today's Date: 11/26/2012 Time: 0802-0827 PT Time Calculation (min): 25 min  PT Assessment / Plan / Recommendation Comments on Treatment Session  Pt reporting he feels nervous today but unsure of why.   Mobility limited due to pt reporting his HR increasing.  HR 103.   RN made aware & she ok'd to proceed however pt requesting to end session for now.      Follow Up Recommendations  CIR     Does the patient have the potential to tolerate intense rehabilitation     Barriers to Discharge        Equipment Recommendations  None recommended by PT    Recommendations for Other Services    Frequency Min 3X/week   Plan Discharge plan remains appropriate;Frequency remains appropriate    Precautions / Restrictions Precautions Precautions: Fall Precaution Comments:  deaf R ear, blind R eye, double vision, open stoma Restrictions Weight Bearing Restrictions: No       Mobility  Bed Mobility Bed Mobility: Not assessed Details for Bed Mobility Assistance: Pt sitting EOB upon arrival.   Transfers Transfers: Stand to Sit;Sit to Stand Sit to Stand: 4: Min guard;With armrests;From toilet;From chair/3-in-1;From bed;With upper extremity assist Stand to Sit: 4: Min guard;With upper extremity assist;With armrests;To chair/3-in-1;To toilet Details for Transfer Assistance: Cues for safe hand placement Ambulation/Gait Ambulation/Gait Assistance: 4: Min guard Ambulation Distance (Feet): 20 Feet Assistive device: Rolling walker Ambulation/Gait Assistance Details: Cues for tall posture.   Pt defering increased distance due to "my heart is beating fast".  HR 103.    RN made aware & she ok'd to proceed but pt requesting clinician to come back at later time if able.   Gait Pattern: Step-through pattern;Decreased stride length;Trunk flexed;Wide base of support Stairs: No Wheelchair Mobility Wheelchair  Mobility: No      PT Goals Acute Rehab PT Goals Time For Goal Achievement: 11/30/12 Potential to Achieve Goals: Good Pt will go Supine/Side to Sit: with modified independence Pt will go Sit to Supine/Side: with supervision Pt will go Sit to Stand: with supervision PT Goal: Sit to Stand - Progress: Progressing toward goal Pt will go Stand to Sit: with supervision PT Goal: Stand to Sit - Progress: Progressing toward goal Pt will Ambulate: >150 feet;with supervision PT Goal: Ambulate - Progress: Not met  Visit Information  Last PT Received On: 11/26/12 Assistance Needed: +1    Subjective Data  Subjective: "I feel nervous today" Patient Stated Goal: to start to get better stability   Cognition  Cognition Overall Cognitive Status: Impaired Arousal/Alertness: Awake/alert Orientation Level: Oriented X4 / Intact Behavior During Session:  (Pt reports feeling nervous) Cognition - Other Comments: very slow to process and to progress with activity, needs lots of time between activities and stalls mobility    Balance     End of Session PT - End of Session Activity Tolerance: Patient limited by fatigue;Other (comment) ("my heart is beating fast") Patient left: in chair;with call bell/phone within reach Nurse Communication: Mobility status;Other (comment) (Pt reporting his HR feels fast)     Sarajane Marek, PTA [phone] 11/26/2012

## 2012-11-26 NOTE — Progress Notes (Signed)
Provided pt with [facility] list if dc home with HH. Has RW, Rollator and ramp at home.  Jonnie Finner RN CCM Case Mgmt phone [phone]

## 2012-11-26 NOTE — Progress Notes (Signed)
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 Pt  participated in [place] prior to return home last admission. Pt readmitted for wound care. Vision assessed this am. 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 Pt with c/o vertical diploia in all fields. Increased c/o blurred vision R eye. Will further assess with use of spot occlusion to decrase symptoms of diplopis. will begin ROM eye exercises. Pt should follow up after D/C with OT at the neuro outpt center to further work with vision. Pt will benefit from acute OT services to address visual deficits and facilitate D/C to home with outpt therapy.    OT Assessment  Patient needs continued OT Services    Follow Up Recommendations  Outpatient OT;Other (comment) (neuro outpt)    Barriers to Discharge None    Equipment Recommendations  None recommended by OT    Recommendations for Other Services    Frequency  Min 3X/week    Precautions / Restrictions Precautions Precautions: Fall Precaution Comments: vestibular and visual impairments   Pertinent Vitals/Pain no apparent distress     ADL  Eating/Feeding: Other (comment) (dys thin liquids) Upper Body Bathing: Set up Lower Body Bathing: Minimal assistance Upper Body Dressing: Set up Lower Body Dressing: Minimal assistance Toilet Transfer: Min guard Toilet Transfer Method: Sit to stand Toileting - Clothing Manipulation and Hygiene: Min guard ADL Comments: affected by diplopia    OT Diagnosis: Disturbance of vision;Other (comment) (balance)  OT Problem List: Impaired vision/perception;Impaired balance (sitting and/or standing);Decreased knowledge of use of DME or AE;Obesity OT Treatment Interventions: Self-care/ADL  training;Neuromuscular education;Therapeutic exercise;Balance training;Patient/family education;Visual/perceptual remediation/compensation;Therapeutic activities   OT Goals Acute Rehab OT Goals OT Goal Formulation: With patient Time For Goal Achievement: 12/10/12 Potential to Achieve Goals: Good ADL Goals Pt Will Transfer to Toilet: with supervision;Ambulation;Extra wide 3-in-1 ADL Goal: Toilet Transfer - Progress: Goal set today Miscellaneous OT Goals Miscellaneous OT Goal #2: Pt will independetly use occlusion glasses during ADL task with set up for ADL OT Goal: Miscellaneous Goal #2 - Progress: Goal set today Miscellaneous OT Goal #3: Pt will independetly complete written eye exercise program. OT Goal: Miscellaneous Goal #3 - Progress: Goal set today  Visit Information  Last OT Received On: 11/26/12 Assistance Needed: +1    Subjective Data      Prior Functioning     Home Living Lives With: Spouse Available Help at Discharge: Family Type of Home: Mobile home Home Access: Ramped entrance Home Layout: One level Bathroom Shower/Tub: Chiropodist: Standard Bathroom Accessibility: Yes How Accessible: Accessible via walker Home Adaptive Equipment: Walker - rolling Prior Function Level of Independence: Independent with assistive device(s) Able to Take Stairs?: No Driving: No Vocation: On disability         Vision/Perception Vision - History Baseline Vision: Wears glasses only for reading Visual History: Other (comment) Patient Visual Report: Diplopia;Blurring of vision Vision - Assessment Eye Alignment: Impaired (comment) Vision Assessment: Vision tested Ocular Range of Motion: Restricted on the right;Restricted looking down Tracking/Visual Pursuits: Decreased smoothness of horizontal tracking;Decreased smoothness of vertical tracking;Decreased smoothness of eye movement to RIGHT inferior field Saccades: Additional head turns occurred during  testing;Additional eye shifts occurred during testing Convergence: Impaired (comment) Diplopia Assessment: Present in near gaze;Present in far gaze;Objects split on top of one  another   Cognition  Cognition Overall Cognitive Status: No family/caregiver present to determine baseline cognitive functioning    Extremity/Trunk Assessment Right Upper Extremity Assessment RUE ROM/Strength/Tone: [facility] for tasks assessed Left Upper Extremity Assessment LUE ROM/Strength/Tone: WFL for tasks assessed Right Lower Extremity Assessment RLE ROM/Strength/Tone: [facility] for tasks assessed Left Lower Extremity Assessment LLE ROM/Strength/Tone: [facility] for tasks assessed     Mobility Bed Mobility Bed Mobility: Supine to Sit;Sit to Supine Supine to Sit: 6: Modified independent (Device/Increase time) Sitting - Scoot to Edge of Bed: 6: Modified independent (Device/Increase time) Transfers Sit to Stand: 4: Min guard Stand to Sit: 4: Min guard     Exercise     Balance     End of Session OT - End of Session Activity Tolerance: Patient tolerated treatment well Patient left: in chair;with call bell/phone within reach Nurse Communication: Mobility status;Other (comment) (visual assessment)  GO     Deondria Puryear,HILLARY 11/26/2012, 5:02 PM [facility], OTR/L  [phone] 11/26/2012

## 2012-11-26 NOTE — Progress Notes (Signed)
Occupational Therapy Treatment Patient Details Name: [name] MRN: [id number] DOB: 06-23-1961 Today's Date: 11/26/2012 Time: [id number] OT Time Calculation (min): 45 min  OT Assessment / Plan / Recommendation Comments on Treatment Session Began occular ROM exercises this pm Pt reports that occlusion gasses are working well for him and that his vision has improved throughtout the day. Pt reports feeling safer when walking with nsg for toileting, etc. Will plan to follow acutley.    Follow Up Recommendations  Outpatient OT;Other (comment)    Barriers to Discharge  None    Equipment Recommendations  None recommended by OT    Recommendations for Other Services    Frequency Min 3X/week   Plan Discharge plan remains appropriate    Precautions / Restrictions Precautions Precautions: Fall Precaution Comments: vestibular and visual impairments   Pertinent Vitals/Pain no apparent distress     ADL  Eating/Feeding: Other (comment) (dys thin liquids) Upper Body Bathing: Set up Lower Body Bathing: Minimal assistance Upper Body Dressing: Set up Lower Body Dressing: Minimal assistance Toilet Transfer: Min guard Toilet Transfer Method: Sit to stand Toileting - Clothing Manipulation and Hygiene: Min guard ADL Comments: Focus of session on adjusting spot tape to improve visual response. Pt with significant reduction in c/o vertical diplopia. Pt states that his vision has improved throughout the day. Began pt on ROM exercises with B eyes. Pt to use hand to cover opposite ey or use patch during ROM exercises. Given written instructions.    OT Diagnosis: Disturbance of vision;Other (comment) (balance)  OT Problem List: Impaired vision/perception;Impaired balance (sitting and/or standing);Decreased knowledge of use of DME or AE;Obesity OT Treatment Interventions: Self-care/ADL training;Neuromuscular education;Therapeutic exercise;Balance training;Patient/family education;Visual/perceptual  remediation/compensation;Therapeutic activities   OT Goals Acute Rehab OT Goals OT Goal Formulation: With patient Time For Goal Achievement: 12/10/12 Potential to Achieve Goals: Good ADL Goals Pt Will Transfer to Toilet: with supervision;Ambulation;Extra wide 3-in-1 ADL Goal: Toilet Transfer - Progress: Progressing toward goals Miscellaneous OT Goals Miscellaneous OT Goal #2: Pt will independetly use occlusion glasses during ADL task with set up for ADL OT Goal: Miscellaneous Goal #2 - Progress: Progressing toward goals Miscellaneous OT Goal #3: Pt will independetly complete written eye exercise program. OT Goal: Miscellaneous Goal #3 - Progress: Progressing toward goals  Visit Information  Last OT Received On: 11/26/12 Assistance Needed: +1    Subjective Data      Prior Functioning  Home Living Lives With: Spouse Available Help at Discharge: Family Type of Home: Mobile home Home Access: Ramped entrance Home Layout: One level Bathroom Shower/Tub: Chiropodist: Standard Bathroom Accessibility: Yes How Accessible: Accessible via walker Home Adaptive Equipment: Walker - rolling Prior Function Level of Independence: Independent with assistive device(s) Able to Take Stairs?: No Driving: No Vocation: On disability    Cognition  Cognition Overall Cognitive Status: No family/caregiver present to determine baseline cognitive functioning    Mobility  Bed Mobility Bed Mobility: Supine to Sit;Sit to Supine Supine to Sit: 6: Modified independent (Device/Increase time) Sitting - Scoot to Edge of Bed: 6: Modified independent (Device/Increase time) Transfers Sit to Stand: 4: Min guard Stand to Sit: 4: Min guard    Exercises  Other Exercises Other Exercises: vision stabilization Other Exercises: smooth pursuits in narrow field Other Exercises: saccadic movements in H field   Balance     End of Session OT - End of Session Activity Tolerance: Patient  tolerated treatment well Patient left: in bed;with call bell/phone within reach Nurse Communication: Mobility status;Other (comment)  GO     Ernestine Langworthy,HILLARY 11/26/2012, 5:15 PM [facility], OTR/L  [phone] 11/26/2012

## 2012-11-26 NOTE — Progress Notes (Signed)
Patient minguard assist at this time and feel once medical workup complete, he can d/c home with home health and not need CIR readmission. [id number]

## 2012-11-27 NOTE — Progress Notes (Signed)
Occupational Therapy Treatment Patient Details Name: [name] MRN: [id number] DOB: [birth year] Today's Date: 11/27/2012 Time: [id number] OT Time Calculation (min): 55 min  OT Assessment / Plan / Recommendation Comments on Treatment Session  Pt continues to report improvement with use of occlusion glasses. Pt is performing parts of HEP independently, but requires cues to perform correctly.  Pt. Will benefit from OPOT at discharge as well as from neuro-ophthalmology consult    Follow Up Recommendations  Outpatient OT;Other (comment) (neuro opthalmology)    Barriers to Discharge       Equipment Recommendations  None recommended by OT    Recommendations for Other Services    Frequency Min 3X/week   Plan Discharge plan remains appropriate    Precautions / Restrictions Precautions Precautions: Fall Precaution Comments: vestibular and visual impairments Restrictions Weight Bearing Restrictions: No   Pertinent Vitals/Pain     ADL  Eating/Feeding: Modified independent (Pt with spills all down the front of his gown)   Pt with occlusion glasses in place while eating meal.  He was able to state wear schedule for glasses with min verbal cues.   He reports the glasses decrease his diplopia, but reports they are not as effective as they were yesterday.  Pt was able to teach back visual pursuit exercises HEP with min verbal cues to perform properly.  He was unable to recall saccadic HEP.   Pt was re-instructed in this exercise.  And instructed him to perform for ~5 mins every hour.  Pt reports blurriness OD, states "it's about 50%".  He also reports shadowing of objects in all quadrants with OD.  Pt participated in visual ROM exercise with therapist OD.   OS, pt with questionable horizontal nystagmus with adduction of eye vs. Adductor weakness and difficulty sustaining isometric contraction at end range adduction - difficult to accurately determine.  Pt also noted with difficulty sustaining  abduction OS.  Pt loses fixation with pursuits OS.  OT Diagnosis:    OT Problem List:   OT Treatment Interventions:     OT Goals Acute Rehab OT Goals Time For Goal Achievement: 12/10/12 Miscellaneous OT Goals OT Goal: Miscellaneous Goal #2 - Progress: Progressing toward goals OT Goal: Miscellaneous Goal #3 - Progress: Progressing toward goals  Visit Information  Last OT Received On: 11/27/12 Assistance Needed: +1    Subjective Data      Prior Functioning       Cognition  Cognition Overall Cognitive Status: Impaired Area of Impairment: Attention;Memory Arousal/Alertness: Awake/alert Orientation Level: Oriented X4 / Intact Behavior During Session: WFL for tasks performed Current Attention Level: Sustained (x 1-3 mins with cues) Attention - Other Comments: Pt self distracts with conversation Memory Deficits: Pt with decreased recall of HEP instruction provided yesterday.  Able to recall partial exercises, but no recollection of the saccadic exercises    Mobility       Exercises  Other Exercises Other Exercises: vision stabilization Other Exercises: smooth pursuits in narrow field Other Exercises: saccadic movements in H field Other Exercises: controlled ROM exercises with focus on isometric hold at end ranges with eccentric concentric control moving into and out of end ranges. Other Exercises: maintaing conjugate gaze at midline slightly superior and ~20" from nose.  Able to sustain x ~8 seconds.  Pt self distracts.  Difficulty maintaining conjugate gaze with the addition of slight movement Lt, Rt. [place]iz[place], vertically   Balance Static Sitting Balance Static Sitting - Balance Support: Feet supported Static Sitting - Level of Assistance: 6: Modified  independent (Device/Increase time)   End of Session OT - End of Session Activity Tolerance: Patient tolerated treatment well Patient left: in bed;with call bell/phone within reach (EOB)  GO     [name]  M 11/27/2012, 2:33 PM

## 2012-11-27 NOTE — Progress Notes (Signed)
Patient ID: [name], male   DOB: May 23, 1961, 52 y.o.   MRN: [id number] BP 132/65  Pulse 80  Temp(Src) 98 F (36.7 C) (Oral)  Resp 17  Ht 6' (1.829 m)  Wt 150.9 kg (332 lb 10.8 oz)  BMI 45.11 kg/m2  SpO2 99% Sleeping, wound appears dry at this time. Still waiting on the longer needle. Stable otherwise.

## 2012-11-28 NOTE — Progress Notes (Signed)
Patient ID: [name], male   DOB: 1960-12-06, 52 y.o.   MRN: [id number] BP 123/93  Pulse 87  Temp(Src) 97.6 F (36.4 C) (Oral)  Resp 18  Ht 6' (1.829 m)  Wt 132.314 kg (291 lb 11.2 oz)  BMI 39.55 kg/m2  SpO2 100% Alert and oriented x 4 Speech clear and fluent Perrl, full eom Moving all extremities Dressing dry today, was dry yesterday.

## 2012-11-28 NOTE — Progress Notes (Signed)
NUTRITION FOLLOW UP   Intervention:   1. Continue Magic Cup TID, Ensure Pudding mixed with 1 scoop Beneprotein TID 2. Recommend swallow eval to determine best diet.   Nutrition Dx:   Increased nutrient needs related to wound healing as evidenced by estimated needs; ongoing.  Goal:  Pt to meet >/= 90% of their estimated nutrition needs; met.   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 Trach d/c'ed 3/3. Pt admitted to rehab 3/4 and was d/c'ed to home 3/25.  Pt with CSF leak and psuedomeningocele. Pt with unsuccessful lumbar drain placement 4/2.  Pt remains inpatient awaiting longer needle for lumbar drain placement.  Per pt he is consuming supplements. Per pt he would like to have foods other than purees. Pt is unsure why he is on this diet. Per pt his wife brings him candy bars, etc. Discussed with RN who will investigate further.  Pt discussed during rounds and with RN.    Height: Ht Readings from Last 1 Encounters:  11/20/12 6' (1.829 m)    Weight Status:   Wt Readings from Last 1 Encounters:  11/20/12 332 lb 10.8 oz (150.9 kg)  No new weight  Re-estimated needs:  Kcal: 2300-2500  Protein: 115-140 grams  Fluid: > 2.3 L/day  Skin: stage 1 and DTI on buttocks  Diet Order: Dysphagia 1 with Thin Liquids Meal Completion: 100%   Intake/Output Summary (Last 24 hours) at 11/28/12 0913 Last data filed at 11/27/12 2040  Gross per 24 hour  Intake      0 ml  Output      2 ml  Net     -2 ml    Last BM: 4/9   Labs:  No results found for this basename: NA, K, CL, CO2, BUN, CREATININE, CALCIUM, MG, PHOS, GLUCOSE,  in the last 168 hours  CBG (last 3)  No results found for this basename: GLUCAP,  in the last 72 hours  Scheduled Meds: . antiseptic oral rinse  15 mL Mouth Rinse QHS  . artificial tears  1 application Right Eye QHS  . atenolol  50 mg Oral BID  . cefTRIAXone (ROCEPHIN)  IV  2 g  Intravenous Q12H  . chlorhexidine  15 mL Mouth/Throat BID  . clindamycin (CLEOCIN) IV  900 mg Intravenous Q8H  . colchicine  0.6 mg Oral Daily  . diazepam  5 mg Intravenous Once  . feeding supplement  1 Container Oral TID BM  . guaiFENesin  1,200 mg Oral BID  . nystatin   Topical BID  . protein supplement  1 scoop Oral TID WC  . tamsulosin  0.4 mg Oral QPC supper  . traZODone  50 mg Oral QHS    Continuous Infusions:   Maylon Peppers [address], [place], [place] Pager [phone] After Hours Pager

## 2012-11-29 MED ORDER — HYDROCODONE-ACETAMINOPHEN 10-325 MG PO TABS: 1.0000 | ORAL_TABLET | Freq: Four times a day (QID) | ORAL | Status: DC | PRN

## 2012-11-29 NOTE — Discharge Summary (Signed)
Physician Discharge Summary  Patient ID: [name] MRN: [id number] DOB/AGE: 52-Mar-1962 52 y.o.  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 Discharge Diagnoses:  Principal Problem:   Postoperative CSF leak   Discharged Condition: good  Hospital Course: [name] was admitted to the hospital with a postoperative csf leak from his wound. He had made multiple visits to the emergency room with repeated head ct scans. They showed a psuedomeningocele. He unfortunately started to drain from his wound on the day of admission. He was taken to the operating room for a primary repair of the leak and attempted lumbar drain placement. His girth was such that we did not have a long enough touhy needle to place the drain. Longer needles are now available in the Main OR suite if the need arises in the future. At discharge the wound has been dry for 72 hours. He may have a stitch abcess, this is because he has been on vancomycin during his postoperative period. It is therefore unlikely that he has a wound infection which developed on antibiotics. 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   Consults: None  Significant Diagnostic Studies: Head CT  Treatments: surgery: Wound repair  Discharge Exam: Blood pressure 138/73, pulse 77, temperature 98.2 F (36.8 C), temperature source Oral, resp. rate 18, height 6' (1.829 m), weight 132.314 kg (291 lb 11.2 oz), SpO2 100.00%. General appearance: alert, cooperative, appears stated age and morbidly obese Neurologic: Mental status: Alert, oriented, thought content appropriate Cranial nerves: VII: lower facial muscle function abnormal on the right Motor: grossly normal  Disposition: 01-Home or Self Care   Future Appointments Provider Department Dept Phone   12/17/2012 10:00 AM Meredith Staggers, MD [facility] [phone]        Medication List    TAKE these medications       albuterol 108 (90 BASE) MCG/ACT inhaler  Commonly known as:  PROVENTIL HFA;VENTOLIN HFA  Inhale 2 puffs into the lungs every 6 (six) hours as needed for wheezing or shortness of breath.     antiseptic oral rinse Liqd  15 mLs by Mouth Rinse route at bedtime.     artificial tears Oint ophthalmic ointment  Place 1 application into the right eye at bedtime.     atenolol 50 MG tablet  Commonly known as:  TENORMIN  Take 1 tablet (50 mg total) by mouth 2 (two) times daily. Note increased dose.     benzocaine 10 % mucosal gel  Commonly known as:  ORAJEL  Use as directed 1 application in the mouth or throat 2 (two) times daily as needed for pain.     cephALEXin 500 MG capsule  Commonly known as:  KEFLEX  Take 500 mg by mouth 4 (four) times daily. 5 day course started 11/17/12     chlorhexidine 0.12 % solution  Commonly known as:  PERIDEX  Use as directed 15 mLs in the mouth or throat 2 (two) times daily.     colchicine 0.6 MG tablet  Take 1 tablet (0.6 mg total) by mouth daily.     eucerin cream  Apply 1 application topically 3 (three) times daily.     guaiFENesin 600 MG 12 hr tablet  Commonly known as:  MUCINEX  Take 2 tablets (1,200 mg total) by mouth 2 (two) times daily. To help loosen secretions.     HYDROcodone-acetaminophen 10-325 MG per tablet  Commonly  known as:  NORCO  Take 1 tablet by mouth every 6 (six) hours as needed for pain. For severe pain.     multivitamin with minerals Tabs  Take 1 tablet by mouth daily.     tamsulosin 0.4 MG Caps  Commonly known as:  FLOMAX  Take 1 capsule (0.4 mg total) by mouth daily after supper. For bladder.     traZODone 50 MG tablet  Commonly known as:  DESYREL  Take 1 tablet (50 mg total) by mouth at bedtime. For insomnia.           Follow-up Information   Schedule an appointment as soon as possible for a visit with Beverly Suriano L, MD. (for suture removal next week)     Contact information:   [address], [address]                         [address] [place] [address] [phone]       Signed: Bayleigh Loflin L 11/29/2012, 7:17 PM

## 2012-11-29 NOTE — Progress Notes (Signed)
OT TREATMENT NOTE Late entry  11/28/12 1600  OT Visit Information  Last OT Received On 11/28/12  Assistance Needed +1  OT Time Calculation  OT Start Time 1545  OT Stop Time 1600  OT Time Calculation (min) 15 min  Precautions  Precautions Fall  ADL  ADL Comments Focus of session on education with wife. reviewed proper wearing time of glasses. Pt to wear glasses throughout the day. PT only to wear patch at night over r eye to protect it while sleeping. Pt given written HEP, which was simplified from before. Wife verbalizes understaniding.  Other Exercises  Other Exercises vision stabilization  Other Exercises smooth pursuits in narrow field  Other Exercises saccadic movements in H field  Other Exercises controlled ROM exercises with focus on isometric hold at end ranges with eccentric concentric control moving into and out of end ranges.  Other Exercises maintaing conjugate gaze at midline slightly superior and ~20" from nose.  Able to sustain x ~8 seconds.  Pt self distracts.  Difficulty maintaining with the addition of slight movement Lt, Rt. Horizontall, vertically  OT - End of Session  Activity Tolerance Patient tolerated treatment well  Patient left in bed;with call bell/phone within reach  Nurse Communication Mobility status;Other (comment)  OT Assessment/Plan  Comments on Treatment Session Worked on education with wife using written HEP. Explained to wife reaon for using occlusion glasses instead of patch and wearing time. also demonstrted proper ocular exercises. wife will benefit from further education on exercises, as will pt. Pt will need to follow up with neuro opthamologist and outpt OT for visual deficits. Pt ambulating with RW. Gait has improved with use of partial occlusion to decrease vertical diplopia. will need to contiue to follow at high frequency to adjust occlusion as needed and reinforce HEP due to apparent cognitive deficits.  OT Plan Discharge plan remains  appropriate  OT Frequency Min 3X/week  Follow Up Recommendations Outpatient OT;Other (comment)  OT Equipment None recommended by OT  Acute Rehab OT Goals  OT Goal Formulation With patient  Time For Goal Achievement 12/10/12  Potential to Achieve Goals Good  ADL Goals  Pt Will Perform Grooming with supervision;Standing at sink;Unsupported  ADL Goal: Grooming - Progress Met  Miscellaneous OT Goals  Miscellaneous OT Goal #2 Pt will independetly use occlusion glasses during ADL task with set up for ADL  OT Goal: Miscellaneous Goal #2 - Progress Progressing toward goals  Miscellaneous OT Goal #3 Pt will independetly complete written eye exercise program.  OT Goal: Miscellaneous Goal #3 - Progress Progressing toward [place], OTR/L  [phone] 11/29/2012

## 2012-11-29 NOTE — Progress Notes (Signed)
Patient ID: [name], male   DOB: 10-Dec-1960, 52 y.o.   MRN: [id number] BP 138/73  Pulse 77  Temp(Src) 98.2 F (36.8 C) (Oral)  Resp 18  Ht 6' (1.829 m)  Wt 132.314 kg (291 lb 11.2 oz)  BMI 39.55 kg/m2  SpO2 100% Alert and oriented x 4 Wound remains dry. Will discharge tomorrow.  Discharge paperwork is completed along with prescriptions.

## 2012-11-29 NOTE — Progress Notes (Signed)
Physical Therapy Treatment Patient Details Name: [name] MRN: [id number] DOB: 01-16-1961 Today's Date: 11/29/2012 Time: [id number] PT Time Calculation (min): 38 min  PT Assessment / Plan / Recommendation Comments on Treatment Session  Ambulation quality and distance improving today. Will require 24 hour assist at home with supervision/mingaurdA for all mobility and ambulation. OT would like pt to continue with OPOT for vision difficulties. Will need OPPT as well for strengthening and balance.     Follow Up Recommendations  Outpatient PT;Supervision/Assistance - 24 hour     Does the patient have the potential to tolerate intense rehabilitation     Barriers to Discharge        Equipment Recommendations  None recommended by PT    Recommendations for Other Services    Frequency Min 3X/week   Plan Discharge plan needs to be updated;Frequency remains appropriate    Precautions / Restrictions Precautions Precautions: Fall Precaution Comments: vestibular and visual impairments Restrictions Weight Bearing Restrictions: No   Pertinent Vitals/Pain Reports a headache, RN aware and provided meds during our session    Mobility  Bed Mobility Bed Mobility: Supine to Sit;Sit to Supine Supine to Sit: 6: Modified independent (Device/Increase time) Sitting - Scoot to Edge of Bed: 6: Modified independent (Device/Increase time) Transfers Sit to Stand: 5: Supervision;With upper extremity assist Stand to Sit: 5: Supervision;With upper extremity assist Details for Transfer Assistance: cues for safe hand placement however pt says pushing from the bed doesn't work for him, observed him pushing down on RW with little difficulty still needign supervision however for safety (slight posterior falter but able to self correct) Ambulation/Gait Ambulation/Gait Assistance: 5: Supervision;4: Min guard Ambulation Distance (Feet): 200 Feet (100 ft x2 with seated rest) Ambulation/Gait Assistance  Details: closer gaurding in his room with smaller spaces and more objects to negotiate, in the hallway with open space pt able to ambulate with supervision; cues for tall posture and safe positioning (tends to push RW too far anteriorly) Gait Pattern: Step-through pattern;Trunk flexed;Wide base of support;Decreased stride length    Exercises General Exercises - Lower Extremity Ankle Circles/Pumps: AROM;Both;10 reps;Supine Long Arc Quad: AROM;Both;10 reps;Seated     PT Goals Acute Rehab PT Goals PT Goal: Supine/Side to Sit - Progress: Met PT Goal: Sit to Supine/Side - Progress: Met PT Goal: Sit to Stand - Progress: Met PT Goal: Stand to Sit - Progress: Met PT Goal: Ambulate - Progress: Progressing toward goal  Visit Information  Last PT Received On: 11/29/12 Assistance Needed: +1    Subjective Data  Subjective: You guys always come when I have a headache.    Cognition  Cognition Overall Cognitive Status: Impaired Area of Impairment: Attention;Memory;Safety/judgement;Awareness of errors;Problem solving Arousal/Alertness: Awake/alert Behavior During Session: [facility] for tasks performed Current Attention Level: Selective Cognition - Other Comments: improved safety awareness today in our session    Balance     End of Session PT - End of Session Equipment Utilized During Treatment: Gait belt Activity Tolerance: Patient tolerated treatment well Patient left: in bed;with call bell/phone within reach Nurse Communication: Mobility status   GP     [place] 11/29/2012, 2:48 PM

## 2012-11-29 NOTE — Progress Notes (Signed)
OT TREATMENT NOTE Late entry   11/28/12 1450  OT Visit Information  Last OT Received On 11/28/12  Assistance Needed +1  OT Time Calculation  OT Start Time 1450  OT Stop Time 1522  OT Time Calculation (min) 32 min  Precautions  Precautions Fall  ADL  Toilet Transfer Supervision/safety  Toilet Transfer Method Other (comment) (ambulating)  Science writer Comfort height toilet  Toileting - Clothing Manipulation and Hygiene Supervision/safety  Where Assessed - Toileting Clothing Manipulation and Hygiene Sit to stand from 3-in-1 or toilet  ADL Comments Pt asking for assistance during toileting for clothing manipulation. no assistance needed and pt encouraged to perform independetly. Sessionalso addressed further assessment of vision and adjusting occlusion tape. Pt demosntrates difficulty with adducting each eye. Can complete separately, but unable to converge together to compete a congugate gaze  Cognition  Overall Cognitive Status Impaired  Area of Impairment Attention;Memory;Safety/judgement;Awareness of errors;Problem solving  Cognition - Other Comments as previously noted. Pt with apparent STM deficits. Cues to perform previously given HEP  Transfers  Transfers Sit to Stand;Stand to Sit  Sit to Stand 5: Supervision  Stand to Sit 5: Supervision  Other Exercises  Other Exercises vision stabilization  Other Exercises smooth pursuits in narrow field  Other Exercises saccadic movements in H field  Other Exercises controlled ROM exercises with focus on isometric hold at end ranges with eccentric concentric control moving into and out of end ranges.  Other Exercises maintaing conjugate gaze at midline slightly superior and ~20" from nose.  Able to sustain x ~8 seconds.  Pt self distracts.  Difficulty maintaining with the addition of slight movement Lt, Rt. Horizontall, vertically  OT - End of Session  Activity Tolerance Patient tolerated treatment well  Patient left in  bed;with call bell/phone within reach  Nurse Communication Mobility status;Other (comment)  OT Assessment/Plan  Comments on Treatment Session Pt making progress with use of partial occlusion for vision. However, pt requires repetitive cues for appropriate use of glasses and performing occular ROM exercises. Pt with dsyconjugate gaze due to apparent CN palsies. 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 Pt c/o photophobia. Pt will need to follow up with neuro opthamologist after D/C.  OT Plan Discharge plan remains appropriate  OT Frequency Min 3X/week  Follow Up Recommendations Outpatient OT;Other (comment)  OT Equipment None recommended by OT  Acute Rehab OT Goals  OT Goal Formulation With patient  Time For Goal Achievement 12/10/12  Potential to Achieve Goals Good  ADL Goals  Pt Will Perform Grooming with supervision;Standing at sink;Unsupported  ADL Goal: Grooming - Progress Met  Pt Will Perform Upper Body Bathing with supervision;Sit to stand from chair  ADL Goal: Upper Body Bathing - Progress Met  Pt Will Transfer to Toilet with supervision;Ambulation;Extra wide 3-in-1  ADL Goal: Toilet Transfer - Progress Met  Pt Will Perform Toileting - Clothing Manipulation with modified independence;Sitting on 3-in-1 or toilet  ADL Goal: Toileting - Clothing Manipulation - Progress Met  Pt Will Perform Toileting - Hygiene with modified independence;Sit to stand from 3-in-1/toilet  ADL Goal: Toileting - Hygiene - Progress Met  Miscellaneous OT Goals  Miscellaneous OT Goal #2 Pt will independetly use occlusion glasses during ADL task with set up for ADL  OT Goal: Miscellaneous Goal #2 - Progress Progressing toward goals  Miscellaneous OT Goal #3 Pt will independetly complete written eye exercise program.  OT Goal: Miscellaneous Goal #3 - Progress Progressing toward goals  [facility]  [place], OTR/L  [phone] 11/29/2012

## 2012-11-30 NOTE — Progress Notes (Signed)
Occupational Therapy Treatment Patient Details Name: [name] MRN: [id number] DOB: [birth year] Today's Date: 11/30/2012 Time: [id number] OT Time Calculation (min): 27 min  OT Assessment / Plan / Recommendation Comments on Treatment Session Pt again educated on importance of wearing occlusion glasses. wife present for education. Position of R eye appears to be improving. Occlusion tape adjusted. Tape narrowed. Pt with improved ambulation using parital occlusion. Pt with increased movement R eye all quadrants. Making progress.  * increased drainage from wound. Bloody/pus mix from @ 8th stitch down. nsg notified  Follow Up Recommendations  Outpatient OT;Other (comment)    Barriers to Discharge       Equipment Recommendations  None recommended by OT    Recommendations for Other Services    Frequency Min 3X/week   Plan Discharge plan remains appropriate    Precautions / Restrictions Precautions Precautions: Fall Precaution Comments: vestibular and visual impairments   Pertinent Vitals/Pain no apparent distress     ADL  Toilet Transfer: Supervision/safety Toilet Transfer Method:  (ambulating) Science writer: Comfort height toilet Toileting - Clothing Manipulation and Hygiene: Supervision/safety Where Assessed - Toileting Clothing Manipulation and Hygiene: Sit to stand from 3-in-1 or toilet ADL Comments: On entry to room, pt wearing eye patch. Educated pt on importance of wearing occlusion glasses. completed occular strengthening ex with pt and modified parital occlusion.     OT Diagnosis:    OT Problem List:   OT Treatment Interventions:     OT Goals Acute Rehab OT Goals OT Goal Formulation: With patient Time For Goal Achievement: 12/10/12 Potential to Achieve Goals: Good ADL Goals Pt Will Perform Grooming: with supervision;Standing at sink;Unsupported ADL Goal: Grooming - Progress: Met Pt Will Transfer to Toilet: with supervision;Ambulation;Extra wide  3-in-1 ADL Goal: Toilet Transfer - Progress: Met Pt Will Perform Toileting - Clothing Manipulation: with modified independence;Sitting on 3-in-1 or toilet ADL Goal: Toileting - Clothing Manipulation - Progress: Met Pt Will Perform Toileting - Hygiene: with modified independence;Sit to stand from 3-in-1/toilet ADL Goal: Toileting - Hygiene - Progress: Met Miscellaneous OT Goals Miscellaneous OT Goal #1: Pt will tolerate 15 minutes of continuous activity  OT Goal: Miscellaneous Goal #1 - Progress: Met Miscellaneous OT Goal #2: Pt will independetly use occlusion glasses during ADL task with set up for ADL OT Goal: Miscellaneous Goal #2 - Progress: Progressing toward goals Miscellaneous OT Goal #3: Pt will independetly complete written eye exercise program. OT Goal: Miscellaneous Goal #3 - Progress: Progressing toward goals  Visit Information  Last OT Received On: 11/30/12 Assistance Needed: +1    Subjective Data      Prior Functioning       Cognition  Cognition Overall Cognitive Status: Impaired    Mobility  Bed Mobility Bed Mobility: Supine to Sit Supine to Sit: 6: Modified independent (Device/Increase time)    Exercises  Other Exercises Other Exercises: vision stabilization Other Exercises: smooth pursuits in narrow field Other Exercises: saccadic movements in H field Other Exercises: controlled ROM exercises with focus on isometric hold at end ranges with eccentric concentric control moving into and out of end ranges. Other Exercises: convergence exercises single eyes. repeated oth eyes. Able to achieve fusion @ 24 in from face, slightly superior. able to hold conjugate gaze x 30 sec.   Balance Dynamic Standing Balance Dynamic Standing - Level of Assistance: 5: Stand by assistance High Level Balance High Level Balance Comments: during functional activites   End of Session OT - End of Session Equipment Utilized During Treatment: Gait  belt Activity Tolerance: Patient  tolerated treatment well Patient left: in bed;with call bell/phone within reach Nurse Communication: Mobility status;Other (comment) (wound oozing)  GO     Bellah Alia,HILLARY 11/30/2012, 6:23 PM [facility], OTR/L  [phone] 11/30/2012

## 2012-11-30 NOTE — Progress Notes (Signed)
Patient ID: [name], male   DOB: [birth year], 52 y.o.   MRN: [id number] Persistent drainage has been noted from right suboccipital incision which had been dry up until yesterday. At this time we are changing dressings every several hours to see if this will drainage doesn't decrease. Incision itself is sunken sutures are in place. An active drainage site cannot be identified. We'll reexamine this area in a days time to see if the drainage slows. Discharge has been put on hold.

## 2012-11-30 NOTE — Progress Notes (Signed)
Filed Vitals:   11/29/12 1438 11/29/12 1441 11/29/12 1907 11/30/12 0600  BP:  115/46 138/73 126/54  Pulse:  88 77 73  Temp:  98 F (36.7 C) 98.2 F (36.8 C) 98.4 F (36.9 C)  TempSrc:  Oral Oral Oral  Resp:  20 18 18   Height:      Weight:      SpO2: 91% 97% 100%     Patient under the care of Dr. Christella Noa. Eight-day status post repair of CSF leak and pseudomeningocele following right retromastoid craniectomy 2 months ago for acoustic neuroma. Patient scheduled for discharge today by Dr. Christella Noa. However early this morning patient apparently scratched around the wound area and nursing staff noted "copious bloody drainage" and contacted Dr. Ellene Route. The patient remains afebrile.  Dressing examined, nursing staff notes that it had been changed about 2 hours ago. We had her remove it and there was moderate bloody drainage with some mucoid drainage as well. The incision itself shows no surrounding erythema. A new dressing was applied by the nursing staff.  Plan: I've recommended that the wound drainage continue to be monitored, and will be reassessed by Dr. Ellene Route either later today or tomorrow.  Hosie Spangle, MD 11/30/2012, 9:44 AM

## 2012-11-30 NOTE — Progress Notes (Signed)
Around 0630, pt's dressing on head incision was stained with copious bloody drainage. Drainage red and yellowish with thick mucus texture. Site cleaned, but still draining. New dressing placed. Dr. Ellene Route paged regarding new drainage. Will come and see later in day. Will report off to day shift and keep eye on drainage. Elspeth Cho, RN [phone] 11/30/12

## 2012-12-01 NOTE — Discharge Summary (Signed)
  11/30/2012 Addendum discharge summary. Patient was planned to be discharged yesterday however developed some drainage from the wound which had some questionable. Once the. He was treated with topical dressing changes and this has since subsided incision itself appears to be clean and healing well with sutures in place. No changes are anticipated his discharge however is being performed today.

## 2012-12-17 ENCOUNTER — Encounter: Payer: Self-pay | Admitting: Physical Medicine & Rehabilitation

## 2012-12-17 ENCOUNTER — Encounter
Payer: BC Managed Care – PPO | Attending: Physical Medicine & Rehabilitation | Admitting: Physical Medicine & Rehabilitation

## 2012-12-17 DIAGNOSIS — S0450XA Injury of facial nerve, unspecified side, initial encounter: Secondary | ICD-10-CM | POA: Insufficient documentation

## 2012-12-17 DIAGNOSIS — R5381 Other malaise: Secondary | ICD-10-CM | POA: Insufficient documentation

## 2012-12-17 DIAGNOSIS — G9782 Other postprocedural complications and disorders of nervous system: Secondary | ICD-10-CM

## 2012-12-17 DIAGNOSIS — D333 Benign neoplasm of cranial nerves: Secondary | ICD-10-CM

## 2012-12-17 DIAGNOSIS — R279 Unspecified lack of coordination: Secondary | ICD-10-CM | POA: Insufficient documentation

## 2012-12-17 DIAGNOSIS — R609 Edema, unspecified: Secondary | ICD-10-CM | POA: Insufficient documentation

## 2012-12-17 DIAGNOSIS — R51 Headache: Secondary | ICD-10-CM | POA: Insufficient documentation

## 2012-12-17 DIAGNOSIS — Y838 Other surgical procedures as the cause of abnormal reaction of the patient, or of later complication, without mention of misadventure at the time of the procedure: Secondary | ICD-10-CM | POA: Insufficient documentation

## 2012-12-17 DIAGNOSIS — G510 Bell's palsy: Secondary | ICD-10-CM

## 2012-12-17 MED ORDER — FUROSEMIDE 20 MG PO TABS: 20.0000 mg | ORAL_TABLET | Freq: Two times a day (BID) | ORAL | Status: DC

## 2012-12-17 MED ORDER — HYDROCODONE-ACETAMINOPHEN 10-325 MG PO TABS: 1.0000 | ORAL_TABLET | Freq: Four times a day (QID) | ORAL | Status: DC | PRN

## 2012-12-17 MED ORDER — HYDROCHLOROTHIAZIDE 25 MG PO TABS: 25.0000 mg | ORAL_TABLET | Freq: Every day | ORAL | Status: DC

## 2012-12-17 NOTE — Patient Instructions (Addendum)
ELEVATE YOUR LEGS. TRY COMPRESSION STOCKINGS.   EAT BANANAS AND TRY TO TAKE IN POTASSIUM  USE VISINE EVERY HOUR OR TWO WHILE AWAKE  WEAR EYE PATCH WITHOUT THE GAUZE, BUY REPLACEMENT PATCHES!!!  FOLLOW UP WITH DR. Benjamine Mola REGARDING YOUR EYE

## 2012-12-17 NOTE — Progress Notes (Signed)
Subjective:    Patient ID: [name], male    DOB: 07-28-1961, 52 y.o.   MRN: [id number]  HPI  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 He was with [place] on inpatient rehab in February and March. He left and developed a wound infection which required readmission and surgical revision/ I and D.   He still is having a lot of right sided, sharp, shooting temporal pain, usually slow in coming on. It is gradually getting better. He is taking hydrocodone for his pain. He can make it up to 5 hours or so with the 10.325 dose.   From a mobility standpoint, he uses a walker at home. He uses a wheelchair for longer distances outside the house.    His dizziness is much improved. His balance is improving. He still has substantial deficits from the 7th nerve injury. He is wearing an eye patch with gauze. He is using lacrilube to lubricate the eye.    Pain Inventory Average Pain 6 Pain Right Now 2 My pain is stabbing  In the last 24 hours, has pain interfered with the following? General activity 7 Relation with others 0 Enjoyment of life 4 What TIME of day is your pain at its worst? morning,day,evening,night time Sleep (in general) Good  Pain is worse with: bending Pain improves with: medication Relief from Meds: 9  Mobility use a walker how many minutes can you walk? 10+ ability to climb steps?  yes do you drive?  no use a wheelchair  Function not employed: date last employed n/a I need assistance with the following:  bathing, meal prep, household duties and shopping  Neuro/Psych weakness numbness trouble walking  Prior Studies Any changes since last visit?  no  Physicians involved in your care Any changes since last visit?  no   Family History  Problem Relation Age of Onset  . CAD Father    History   Social History  . Marital Status: Married    Spouse Name: N/A    Number of Children: N/A  . Years of Education: N/A    Social History Main Topics  . Smoking status: Heavy Tobacco Smoker -- 0.50 packs/day for 20 years    Types: Cigarettes  . Smokeless tobacco: Current User     Comment: Rare tobacco.  "[name]"  . Alcohol Use: No  . Drug Use: No  . Sexually Active: Yes   Other Topics Concern  . None   Social History Narrative  . None   Past Surgical History  Procedure Laterality Date  . Coronary artery bypass graft      LIMA to LAD 2009  . Umbilical hernia repair    . Anal fistulectomy    . Retrosigmoid craniectomy for tumor resection Right 10/11/2012    Procedure: RETROSIGMOID CRANIECTOMY FOR TUMOR RESECTION;  Surgeon: [name], MD;  Location: [place] [facility];  Service: Neurosurgery;  Laterality: Right;  Craniotomy for acoustic neuroma  . Tracheostomy tube placement N/A 10/11/2012    Procedure: TRACHEOSTOMY;  Surgeon: Ascencion Dike, MD;  Location: [facility];  Service: ENT;  Laterality: N/A;  . Acoustic neuroma resection N/A 10/11/2012    Procedure: ACOUSTIC NEUROMA RESECTION;  Surgeon: Ascencion Dike, MD;  Location: [facility];  Service: ENT;  Laterality: N/A;  . Craniotomy Right 11/20/2012    Procedure: CRANIOTOMY REPAIR DURAL/CENTRAL SPINAL FLUID LEAK;  Surgeon: [name], MD;  Location: [facility];  Service: Neurosurgery;  Laterality: Right;  . Placement of lumbar drain N/A 11/20/2012    Procedure: Attempted PLACEMENT OF LUMBAR DRAIN;  Surgeon: [name], MD;  Location: [place] [facility];  Service: Neurosurgery;  Laterality: N/A;   Past Medical History  Diagnosis Date  . CAD (coronary artery disease)     Cath 09, 99% LAD  . Cardiomyopathy, ischemic     Ischemic  . Obesity   . Fibromyalgia   . Shortness of breath   . CHF (congestive heart failure)   . Hypertension     dr Percival Spanish  . Cyst near coccyx   . Diabetes mellitus without complication     borderline  . Cyst near tailbone   . Kidney stones   . Myocardial infarction   . Headache     with lights   BP 159/82   Pulse 72  Resp 16  Ht 5\' 11"  (1.803 m)  Wt 296 lb (134.265 kg)  BMI 41.3 kg/m2  SpO2 93%     Review of Systems  Respiratory: Positive for apnea and cough.   Cardiovascular: Positive for leg swelling.  Musculoskeletal: Positive for gait problem.  Neurological: Positive for weakness and numbness.  All other systems reviewed and are negative.       Objective:   Physical Exam  General: Alert and oriented x 3, No apparent distress. Wearing an eye patch on right. HEENT: Head is normocephalic, atraumatic, PERRLA, EOMI, sclera are injected and red, has mild drainage medial corner of right eye., oral mucosa pink and moist, dentition intact, ext ear canals clear.  Neck: Supple without JVD or lymphadenopathy Heart: Reg rate and rhythm. No murmurs rubs or gallops Chest: CTA bilaterally without wheezes, rales, or rhonchi; no distress Abdomen: Soft, non-tender, non-distended, bowel sounds positive. Extremities: No clubbing, cyanosis, 2+ edema both lower extremities.  Pulses are 2+ Skin: Clean and intact without signs of breakdown. Healing surgical scar right scalp. Neuro: Cognitively he's at baseline. He continues to have decrease lid closure, brow bending and facial weakness on the right.  No hearing on the right. Strength is 4 to 5/5 all 4 extremities. Right limb ataxia still noted.  Musculoskeletal: Full ROM, No pain with AROM or PROM in the neck, trunk, or extremities. Posture appropriate Psych: Pt's affect is appropriate. Pt is cooperative         Assessment & Plan:  1. Functional deficits secondary to acoustic neuroma s/p resections with subsequent facial nerve injury, vestibular deficits, Right hemiataxia, deconditioning  2. Headaches right temporal area related to tumor/surgery. 3. Lower extremity edema  Plan:  1. Visine drops every hour or two while awake 2. Home health PT to advance gait and to work on balance 3. Refill hydrocodone for pain control 4. Start HCTZ 25mg   daily. Keep legs elevated. Compression stockings. Increase potassium intake. Check bmet at next visit.  5. Follow up with me in a month. 30 minutes of face to face patient care time were spent during this visit. All questions were encouraged and answered.

## 2013-01-14 ENCOUNTER — Other Ambulatory Visit: Payer: Self-pay | Admitting: *Deleted

## 2013-01-14 MED ORDER — HYDROCODONE-ACETAMINOPHEN 10-325 MG PO TABS: 1.0000 | ORAL_TABLET | Freq: Four times a day (QID) | ORAL | Status: DC | PRN

## 2013-01-14 NOTE — Telephone Encounter (Signed)
Notified of refill. 

## 2013-01-15 ENCOUNTER — Encounter: Payer: BC Managed Care – PPO | Admitting: Physical Medicine & Rehabilitation

## 2013-01-31 ENCOUNTER — Encounter: Payer: Self-pay | Admitting: Physical Medicine & Rehabilitation

## 2013-01-31 ENCOUNTER — Encounter
Payer: BC Managed Care – PPO | Attending: Physical Medicine & Rehabilitation | Admitting: Physical Medicine & Rehabilitation

## 2013-01-31 DIAGNOSIS — Y838 Other surgical procedures as the cause of abnormal reaction of the patient, or of later complication, without mention of misadventure at the time of the procedure: Secondary | ICD-10-CM | POA: Insufficient documentation

## 2013-01-31 DIAGNOSIS — Z5181 Encounter for therapeutic drug level monitoring: Secondary | ICD-10-CM | POA: Insufficient documentation

## 2013-01-31 DIAGNOSIS — R609 Edema, unspecified: Secondary | ICD-10-CM

## 2013-01-31 DIAGNOSIS — D333 Benign neoplasm of cranial nerves: Secondary | ICD-10-CM

## 2013-01-31 DIAGNOSIS — G988 Other disorders of nervous system: Secondary | ICD-10-CM | POA: Insufficient documentation

## 2013-01-31 DIAGNOSIS — G510 Bell's palsy: Secondary | ICD-10-CM | POA: Insufficient documentation

## 2013-01-31 DIAGNOSIS — Z79899 Other long term (current) drug therapy: Secondary | ICD-10-CM | POA: Insufficient documentation

## 2013-01-31 MED ORDER — TRAZODONE HCL 50 MG PO TABS: 50.0000 mg | ORAL_TABLET | Freq: Every day | ORAL | Status: DC

## 2013-01-31 MED ORDER — HYDROCODONE-ACETAMINOPHEN 10-325 MG PO TABS: 1.0000 | ORAL_TABLET | Freq: Four times a day (QID) | ORAL | Status: DC | PRN

## 2013-01-31 MED ORDER — TOPIRAMATE 25 MG PO TABS: 25.0000 mg | ORAL_TABLET | Freq: Every day | ORAL | Status: DC

## 2013-01-31 NOTE — Progress Notes (Signed)
Subjective:    Patient ID: [name], male    DOB: 1960-12-17, 52 y.o.   MRN: [id number]  HPI  Mr. Hogland is back regarding his acoustic neuroma. He has been having more pain in his right temple. The hydrocodone doesn't seem to be controlling it as well. He is sometimes taking 5 per day. The headaches are often shooting and throbbing. He has numbness and tingling in this area as well as the face.   He is also on  Lasix in place of HCTZ for lower ext edema per Dr. Arelia Sneddon which may have helped a little bit.   From a mobility standpoint he's trying to walk around the house multiple times each day. He uses his cane or walker for balance. He hasn't had any falls. He is not driving.   Pain Inventory  Average Pain 6  Pain Right Now 2  My pain is stabbing  In the last 24 hours, has pain interfered with the following?  General activity 7  Relation with others 0  Enjoyment of life 4  What TIME of day is your pain at its worst? morning,day,evening,night time  Sleep (in general) Good  Pain is worse with: bending  Pain improves with: medication  Relief from Meds: 9  Mobility  use a walker  how many minutes can you walk? 10+  ability to climb steps? yes  do you drive? no  use a wheelchair  Function  not employed: date last employed n/a  I need assistance with the following: bathing, meal prep, household duties and shopping  Neuro/Psych  weakness  numbness  trouble walking  Prior Studies  Any changes since last visit? no  Physicians involved in your care  Any changes since last visit? no    Family History  Problem Relation Age of Onset  . CAD Father    History   Social History  . Marital Status: Married    Spouse Name: N/A    Number of Children: N/A  . Years of Education: N/A   Social History Main Topics  . Smoking status: Heavy Tobacco Smoker -- 0.50 packs/day for 20 years    Types: Cigarettes  . Smokeless tobacco: Current User     Comment: Rare tobacco.  "Puff  or two a day"  . Alcohol Use: No  . Drug Use: No  . Sexually Active: Yes   Other Topics Concern  . None   Social History Narrative  . None   Past Surgical History  Procedure Laterality Date  . Coronary artery bypass graft      LIMA to LAD 2009  . Umbilical hernia repair    . Anal fistulectomy    . Retrosigmoid craniectomy for tumor resection Right 10/11/2012    Procedure: RETROSIGMOID CRANIECTOMY FOR TUMOR RESECTION;  Surgeon: [name], MD;  Location: [place] [facility];  Service: Neurosurgery;  Laterality: Right;  Craniotomy for acoustic neuroma  . Tracheostomy tube placement N/A 10/11/2012    Procedure: TRACHEOSTOMY;  Surgeon: Ascencion Dike, MD;  Location: [facility];  Service: ENT;  Laterality: N/A;  . Acoustic neuroma resection N/A 10/11/2012    Procedure: ACOUSTIC NEUROMA RESECTION;  Surgeon: Ascencion Dike, MD;  Location: [place] [facility];  Service: ENT;  Laterality: N/A;  . Craniotomy Right 11/20/2012    Procedure: CRANIOTOMY REPAIR DURAL/CENTRAL SPINAL FLUID LEAK;  Surgeon: [name], MD;  Location: [place] [facility];  Service: Neurosurgery;  Laterality: Right;  . Placement of lumbar drain N/A 11/20/2012  Procedure: Attempted PLACEMENT OF LUMBAR DRAIN;  Surgeon: [name], MD;  Location: [place] [facility];  Service: Neurosurgery;  Laterality: N/A;   Past Medical History  Diagnosis Date  . CAD (coronary artery disease)     Cath 09, 99% LAD  . Cardiomyopathy, ischemic     Ischemic  . Obesity   . Fibromyalgia   . Shortness of breath   . CHF (congestive heart failure)   . Hypertension     dr Percival Spanish  . Cyst near coccyx   . Diabetes mellitus without complication     borderline  . Cyst near tailbone   . Kidney stones   . Myocardial infarction   . Headache(784.0)     with lights   BP 162/84  Pulse 75  Resp 16  Ht 5\' 11"  (1.803 m)  Wt 365 lb (165.563 kg)  BMI 50.93 kg/m2  SpO2 91%     Review of Systems  All other systems reviewed and are negative.       Objective:    Physical Exam  General: Alert and oriented x 3, No apparent distress. Wearing an eye patch on right.  HEENT: Head is normocephalic, atraumatic, PERRLA, EOMI, sclera are injected and more irritated/red, has mild drainage medial corner of right eye, oral mucosa pink and moist, dentition intact, ext ear canals clear.  Neck: Supple without JVD or lymphadenopathy  Heart: Reg rate and rhythm. No murmurs rubs or gallops  Chest: CTA bilaterally without wheezes, rales, or rhonchi; no distress   Abdomen: Soft, non-tender, non-distended, bowel sounds positive.  Extremities: No clubbing, cyanosis, 2+ edema both lower extremities with dry skin at soles of feet.  Pulses are 2+  Skin: Clean and intact without signs of breakdown. Healing surgical scar right scalp.  Neuro: Cognitively he's at baseline. He's a bit impulsive.  He continues to have decrease lid closure, brow bending and facial weakness on the right. No hearing on the right. Strength is 4 to 5/5 all 4 extremities. Right limb ataxia still noted.  Musculoskeletal: Full ROM, No pain with AROM or PROM in the neck, trunk, or extremities. Posture appropriate  Psych: Pt's affect is appropriate. Pt is cooperative   Assessment & Plan:   1. 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   -right eye is quite irritated today. 2. Headaches right temporal area related to tumor/surgery.  3. Lower extremity edema, morbid obesity  Plan:  1. Continue eye gtts. Discussed application of tape over eyelid at night. Recommended the use of swim goggles during the day to provide a moist environment for the eye. He needs optho follow up for the eye. ?weight for lid 2. Discussed appropriate exercise and elevation of the legs during the day to help edema control. Continue lasix per Dr. Arelia Sneddon.  3. Refilled hydrocodone for pain control. Want to limit this for 4  per day.  4. Add topamax for headaches, titrating up to 50mg  qhs 5. Follow up with me in 2 months. 30 minutes of face to face patient care time were spent during this visit. All questions were encouraged and answered.

## 2013-01-31 NOTE — Patient Instructions (Addendum)
TAPE YOUR EYE LID AT NIGHT!!!  TRY WEARING SWIM GOGGLES DURING THE DAY TO KEEP YOUR EYE BALL MOIST. CONTINUE WITH YOUR EYE DROPS.   TAKE ONE TOPAMAX AT NIGHT FOR ONE WEEK----IF YOU ARE TOLERATING BUT HEADACHES AREN'T BETTER THEN INCREASE TO TWO TABLETS AT BEDTIME   KEEP YOUR LEGS ELEVATED WHILE YOU SIT AND SLEEP.   KEEP YOUR HYDROCODONES TO A MAXIMUM OF 4 PER DAY.

## 2013-03-06 ENCOUNTER — Other Ambulatory Visit: Payer: Self-pay | Admitting: Physical Medicine & Rehabilitation

## 2013-03-07 ENCOUNTER — Telehealth: Payer: Self-pay

## 2013-03-07 NOTE — Telephone Encounter (Signed)
[facility] called to let us know that patient had hydrocodone 5/325 #60 on 02/20/13 by Dr Arelia Sneddon.  We recently refilled hydrocodone 10/325.  Patient does not have a controlled substance agreement. Please advise.

## 2013-03-07 NOTE — Telephone Encounter (Signed)
That's ok for now. We will discuss further at his next visits. No new narc rx'es from here until he is seen and has a CSA

## 2013-03-07 NOTE — Telephone Encounter (Signed)
Pharmacy aware it is ok to fill hydrocodone this time.

## 2013-03-24 ENCOUNTER — Other Ambulatory Visit: Payer: Self-pay | Admitting: Neurosurgery

## 2013-03-24 DIAGNOSIS — D333 Benign neoplasm of cranial nerves: Secondary | ICD-10-CM

## 2013-03-28 ENCOUNTER — Other Ambulatory Visit: Payer: BC Managed Care – PPO

## 2013-03-31 ENCOUNTER — Other Ambulatory Visit: Payer: BC Managed Care – PPO

## 2013-04-10 ENCOUNTER — Other Ambulatory Visit: Payer: BC Managed Care – PPO

## 2013-04-18 ENCOUNTER — Encounter
Payer: BC Managed Care – PPO | Attending: Physical Medicine & Rehabilitation | Admitting: Physical Medicine & Rehabilitation

## 2013-04-18 ENCOUNTER — Encounter: Payer: Self-pay | Admitting: Physical Medicine & Rehabilitation

## 2013-04-18 VITALS — BP 161/76 | HR 67 | Resp 18 | Ht 71.0 in | Wt 370.0 lb

## 2013-04-18 DIAGNOSIS — Z5181 Encounter for therapeutic drug level monitoring: Secondary | ICD-10-CM | POA: Insufficient documentation

## 2013-04-18 DIAGNOSIS — G510 Bell's palsy: Secondary | ICD-10-CM | POA: Insufficient documentation

## 2013-04-18 DIAGNOSIS — G988 Other disorders of nervous system: Secondary | ICD-10-CM | POA: Insufficient documentation

## 2013-04-18 DIAGNOSIS — Y838 Other surgical procedures as the cause of abnormal reaction of the patient, or of later complication, without mention of misadventure at the time of the procedure: Secondary | ICD-10-CM | POA: Insufficient documentation

## 2013-04-18 DIAGNOSIS — R609 Edema, unspecified: Secondary | ICD-10-CM

## 2013-04-18 DIAGNOSIS — Z79899 Other long term (current) drug therapy: Secondary | ICD-10-CM | POA: Insufficient documentation

## 2013-04-18 DIAGNOSIS — Z9889 Other specified postprocedural states: Secondary | ICD-10-CM

## 2013-04-18 DIAGNOSIS — D333 Benign neoplasm of cranial nerves: Secondary | ICD-10-CM | POA: Insufficient documentation

## 2013-04-18 MED ORDER — HYDROCODONE-ACETAMINOPHEN 10-325 MG PO TABS: ORAL_TABLET | ORAL | Status: DC

## 2013-04-18 MED ORDER — TOPIRAMATE 50 MG PO TABS: 100.0000 mg | ORAL_TABLET | Freq: Every evening | ORAL | Status: DC | PRN

## 2013-04-18 NOTE — Progress Notes (Signed)
Subjective:    Patient ID: [name], male    DOB: 1960-10-05, 52 y.o.   MRN: [id number]  HPI  Mr. Leyden is back regarding his acoustic neuroma s/p resection with ongoing facial nerve deficits, and vestibular dysfunction.  He complains of ongoing swelling in his legs. The "fluid pills" arent working.  He is taking HCTZ and lasix per Dr. Arelia Sneddon.   His pain levels have been improving. He continues on hydrocodone up to 4x per day. He is taking 50mg  of topamax at night which has helped somewhat. He also sleeps well with these.       Pain Inventory Average Pain 4 Pain Right Now na My pain is na  In the last 24 hours, has pain interfered with the following? General activity na Relation with others na Enjoyment of life na What TIME of day is your pain at its worst? evening Sleep (in general) NA  Pain is worse with: standing Pain improves with: na Relief from Meds: na  Mobility walk with assistance ability to climb steps?  yes do you drive?  no  Function Do you have any goals in this area?  no  Neuro/Psych tremor  Prior Studies Any changes since last visit?  no  Physicians involved in your care Any changes since last visit?  no   Family History  Problem Relation Age of Onset  . CAD Father    History   Social History  . Marital Status: Married    Spouse Name: N/A    Number of Children: N/A  . Years of Education: N/A   Social History Main Topics  . Smoking status: Heavy Tobacco Smoker -- 0.50 packs/day for 20 years    Types: Cigarettes  . Smokeless tobacco: Current User     Comment: Rare tobacco.  "[name]"  . Alcohol Use: No  . Drug Use: No  . Sexual Activity: Yes   Other Topics Concern  . None   Social History Narrative  . None   Past Surgical History  Procedure Laterality Date  . Coronary artery bypass graft      LIMA to LAD 2009  . Umbilical hernia repair    . Anal fistulectomy    . Retrosigmoid craniectomy for tumor  resection Right 10/11/2012    Procedure: RETROSIGMOID CRANIECTOMY FOR TUMOR RESECTION;  Surgeon: [name], MD;  Location: [place] [facility];  Service: Neurosurgery;  Laterality: Right;  Craniotomy for acoustic neuroma  . Tracheostomy tube placement N/A 10/11/2012    Procedure: TRACHEOSTOMY;  Surgeon: Ascencion Dike, MD;  Location: [facility];  Service: ENT;  Laterality: N/A;  . Acoustic neuroma resection N/A 10/11/2012    Procedure: ACOUSTIC NEUROMA RESECTION;  Surgeon: Ascencion Dike, MD;  Location: [place] [facility];  Service: ENT;  Laterality: N/A;  . Craniotomy Right 11/20/2012    Procedure: CRANIOTOMY REPAIR DURAL/CENTRAL SPINAL FLUID LEAK;  Surgeon: [name], MD;  Location: [place] [facility];  Service: Neurosurgery;  Laterality: Right;  . Placement of lumbar drain N/A 11/20/2012    Procedure: Attempted PLACEMENT OF LUMBAR DRAIN;  Surgeon: [name], MD;  Location: [place] [facility];  Service: Neurosurgery;  Laterality: N/A;   Past Medical History  Diagnosis Date  . CAD (coronary artery disease)     Cath 09, 99% LAD  . Cardiomyopathy, ischemic     Ischemic  . Obesity   . Fibromyalgia   . Shortness of breath   . CHF (congestive heart  failure)   . Hypertension     dr Percival Spanish  . Cyst near coccyx   . Diabetes mellitus without complication     borderline  . Cyst near tailbone   . Kidney stones   . Myocardial infarction   . Headache(784.0)     with lights   BP 161/76  Pulse 67  Resp 18  Ht 5\' 11"  (1.803 m)  Wt 370 lb (167.831 kg)  BMI 51.63 kg/m2  SpO2 91%     Review of Systems     Objective:   Physical Exam  General: Alert and oriented x 3, No apparent distress. Wearing an eye patch on right.  HEENT: Head is normocephalic, atraumatic, PERRLA, EOMI, sclera are injected and more irritated/red, has mild drainage medial corner of right eye, oral mucosa pink and moist, dentition intact, ext ear canals clear.  Neck: Supple without JVD or lymphadenopathy  Heart: Reg rate and rhythm. No  murmurs rubs or gallops  Chest: CTA bilaterally without wheezes, rales, or rhonchi; no distress  Abdomen: Soft, non-tender, non-distended, bowel sounds positive.  Extremities: No clubbing, cyanosis, 2+ edema both lower extremities with dry skin at soles of feet. Pulses are 2+  Skin: Clean and intact without signs of breakdown. Healing surgical scar right scalp.  Neuro: Cognitively he's at baseline. He's a bit impulsive. He continues to have decreased lid closure, brow bending and facial weakness on the right (minimal improvement). No hearing on the right. Strength is 4 to 5/5 all 4 extremities. Right limb ataxia still noted.  Musculoskeletal: Full ROM, No pain with AROM or PROM in the neck, trunk, or extremities. Posture appropriate  Psych: Pt's affect is appropriate. Pt is cooperative    Assessment & Plan:   1. 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  -right eye is quite irritated today.  2. Headaches right temporal area related to tumor/surgery.  3. Lower extremity edema, morbid obesity    Plan:  1. Continue eye gtts, goggles, eye patch. 2. Discussed appropriate exercise and elevation of the legs during the day to help edema control. Continue lasix per Dr. Arelia Sneddon. He needs labwork followed up per routine. I recommended trying ACE wraps for his legs (up to the knees). Weight loss is also needed.  3. Refilled hydrocodone for pain control. Want to see the use of these go down 4. Increase topamax to 100mg  qhs. This may also help with weight. 5. Follow up with me in 2 months. 30 minutes of face to face patient care time were spent during this visit. All questions were encouraged and answered.

## 2013-04-18 NOTE — Patient Instructions (Signed)
TRY ACE WRAP FROM YOUR FEET TO YOUR KNEES. ELEVATE YOUR FEET WHEN YOU CAN.  WORK ON YOUR WEIGHT

## 2013-06-16 ENCOUNTER — Encounter
Payer: BC Managed Care – PPO | Attending: Physical Medicine & Rehabilitation | Admitting: Physical Medicine & Rehabilitation

## 2013-06-16 ENCOUNTER — Encounter: Payer: Self-pay | Admitting: Physical Medicine & Rehabilitation

## 2013-06-16 VITALS — BP 132/68 | HR 74 | Resp 14 | Ht 71.0 in | Wt 357.8 lb

## 2013-06-16 DIAGNOSIS — L02419 Cutaneous abscess of limb, unspecified: Secondary | ICD-10-CM

## 2013-06-16 DIAGNOSIS — Z9889 Other specified postprocedural states: Secondary | ICD-10-CM

## 2013-06-16 DIAGNOSIS — Y838 Other surgical procedures as the cause of abnormal reaction of the patient, or of later complication, without mention of misadventure at the time of the procedure: Secondary | ICD-10-CM | POA: Insufficient documentation

## 2013-06-16 DIAGNOSIS — G988 Other disorders of nervous system: Secondary | ICD-10-CM | POA: Insufficient documentation

## 2013-06-16 DIAGNOSIS — Z79899 Other long term (current) drug therapy: Secondary | ICD-10-CM | POA: Insufficient documentation

## 2013-06-16 DIAGNOSIS — G510 Bell's palsy: Secondary | ICD-10-CM | POA: Insufficient documentation

## 2013-06-16 DIAGNOSIS — D333 Benign neoplasm of cranial nerves: Secondary | ICD-10-CM | POA: Insufficient documentation

## 2013-06-16 DIAGNOSIS — Z5181 Encounter for therapeutic drug level monitoring: Secondary | ICD-10-CM | POA: Insufficient documentation

## 2013-06-16 MED ORDER — TOPIRAMATE 50 MG PO TABS: 100.0000 mg | ORAL_TABLET | Freq: Two times a day (BID) | ORAL | Status: DC

## 2013-06-16 MED ORDER — CEPHALEXIN 250 MG PO CAPS: 250.0000 mg | ORAL_CAPSULE | Freq: Four times a day (QID) | ORAL | Status: DC

## 2013-06-16 MED ORDER — HYDROCODONE-ACETAMINOPHEN 10-325 MG PO TABS: ORAL_TABLET | ORAL | Status: DC

## 2013-06-16 NOTE — Patient Instructions (Signed)
2 month follow up with Dr Naaman Plummer.  See Dr Arelia Sneddon next week to follow up on cellulitis

## 2013-06-16 NOTE — Progress Notes (Signed)
Subjective:    Patient ID: [name], male    DOB: 10/18/1960, 52 y.o.   MRN: [id number]  HPI  Mr. 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   He complains of increased swelling in his right leg/foot. He also notes swelling in his right face. He is wearing an eye patch and using his eye gtts'. He doesn't want to see another specialist due to cost. He refuses to wear goggles as the pressure is too much for his head.   He is wearing his socks and elevating the foot but it doesn't seem to help.   He is also noticing that his pain has been worse over the last several weeks and he feels that he needs the hydrocodone every 4-5 hours again. He is taking the topamax as i prescribed.   He has not been back to Dr. Arelia Sneddon regarding his swelling (per pt).     Pain Inventory Average Pain 8 Pain Right Now 8 My pain is aching  In the last 24 hours, has pain interfered with the following? General activity 8 Relation with others 2 Enjoyment of life 5 What TIME of day is your pain at its worst? all day Sleep (in general) Fair  Pain is worse with: walking, bending, sitting, inactivity and standing Pain improves with: other Relief from Meds: 5  Mobility walk with assistance use a walker ability to climb steps?  no do you drive?  no Do you have any goals in this area?  yes  Function I need assistance with the following:  bathing, toileting, household duties and shopping Do you have any goals in this area?  yes  Neuro/Psych weakness numbness tingling trouble walking  Prior Studies Any changes since last visit?  no  Physicians involved in your care Any changes since last visit?  no   Family History  Problem Relation Age of Onset  . CAD Father    History   Social History  . Marital Status: Married    Spouse Name: N/A    Number of Children: N/A  . Years of Education: N/A   Social  History Main Topics  . Smoking status: Heavy Tobacco Smoker -- 0.50 packs/day for 20 years    Types: Cigarettes  . Smokeless tobacco: Current User     Comment: Rare tobacco.  "[name]"  . Alcohol Use: No  . Drug Use: No  . Sexual Activity: Yes   Other Topics Concern  . None   Social History Narrative  . None   Past Surgical History  Procedure Laterality Date  . Coronary artery bypass graft      LIMA to LAD 2009  . Umbilical hernia repair    . Anal fistulectomy    . Retrosigmoid craniectomy for tumor resection Right 10/11/2012    Procedure: RETROSIGMOID CRANIECTOMY FOR TUMOR RESECTION;  Surgeon: [name], MD;  Location: [place] [facility];  Service: Neurosurgery;  Laterality: Right;  Craniotomy for acoustic neuroma  . Tracheostomy tube placement N/A 10/11/2012    Procedure: TRACHEOSTOMY;  Surgeon: Ascencion Dike, MD;  Location: [place] [facility];  Service: ENT;  Laterality: N/A;  . Acoustic neuroma resection N/A 10/11/2012    Procedure: ACOUSTIC NEUROMA RESECTION;  Surgeon: Ascencion Dike, MD;  Location: [place] [facility];  Service: ENT;  Laterality: N/A;  . Craniotomy Right 11/20/2012    Procedure: CRANIOTOMY REPAIR DURAL/CENTRAL SPINAL FLUID LEAK;  Surgeon: Marylyn Ishihara  Osie Bond, MD;  Location: [place] [facility];  Service: Neurosurgery;  Laterality: Right;  . Placement of lumbar drain N/A 11/20/2012    Procedure: Attempted PLACEMENT OF LUMBAR DRAIN;  Surgeon: [name], MD;  Location: [place] [facility];  Service: Neurosurgery;  Laterality: N/A;   Past Medical History  Diagnosis Date  . CAD (coronary artery disease)     Cath 09, 99% LAD  . Cardiomyopathy, ischemic     Ischemic  . Obesity   . Fibromyalgia   . Shortness of breath   . CHF (congestive heart failure)   . Hypertension     dr Percival Spanish  . Cyst near coccyx   . Diabetes mellitus without complication     borderline  . Cyst near tailbone   . Kidney stones   . Myocardial infarction   . Headache(784.0)     with lights   BP 132/68  Pulse  74  Resp 14  Ht 5\' 11"  (1.803 m)  Wt 357 lb 12.8 oz (162.297 kg)  BMI 49.93 kg/m2  SpO2 93%     Review of Systems  Constitutional: Positive for chills and unexpected weight change.  Respiratory: Positive for apnea, shortness of breath and wheezing.   Cardiovascular: Positive for leg swelling.  Musculoskeletal: Positive for gait problem.  Skin: Positive for rash.  Neurological: Positive for weakness and numbness.       Tingling  All other systems reviewed and are negative.       Objective:   Physical Exam  General: Alert and oriented x 3, No apparent distress. Wearing an eye patch on right.  HEENT: Head is normocephalic, atraumatic, PERRLA, EOMI, sclera are injected and still  Irritated/red,  oral mucosa pink and moist, dentition intact, ext ear canals clear.  Neck: Supple without JVD or lymphadenopathy  Heart: Reg rate and rhythm. No murmurs rubs or gallops  Chest: CTA bilaterally without wheezes, rales, or rhonchi; no distress  Abdomen: Soft, non-tender, non-distended, bowel sounds positive.  Extremities: No clubbing, cyanosis, 2+ edema RLE, 1+ LLE with dry skin at soles of feet. Pulses are 2+  Skin: Clean and intact without signs of breakdown. Healing surgical scar right scalp. Right foot red warm along dorsum. No obvious breakdown.  Neuro: Cognitively he's at baseline. He's a bit impulsive. He continues to have decreased lid closure, brow bending and facial weakness on the right (minimal improvement). No hearing on the right. Strength is 4 to 5/5 all 4 extremities. Right limb ataxia still noted.  Musculoskeletal: Full ROM, No pain with AROM or PROM in the neck, trunk, or extremities. Posture appropriate  Psych: Pt's affect is appropriate. Pt is cooperative  Assessment & Plan:   1. 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  -right eye is quite irritated today.   2. Headaches right temporal area related to tumor/surgery.  3. Lower extremity edema, morbid obesity  4. Cellulitis Right foot  Plan:  1. Continue eye gtts, , eye patch. Still recommend an optho referall. 2. Keflex 250mg  qid. Asked that he follow up with Dr. Arelia Sneddon next week for resoloution. Continue with other measures as we have described before  3. Refilled hydrocodone for pain control #120. Gave second rx for next month.  4. Increase topamax to 100mg  bid for neuropathic facial pain, extremity pain, headache.     5. Follow up with me in 2 months. 30 minutes of face to face patient care time were spent  during this visit. All questions were encouraged and answered.

## 2013-06-27 ENCOUNTER — Other Ambulatory Visit: Payer: Self-pay | Admitting: Physical Medicine & Rehabilitation

## 2013-07-25 IMAGING — CR DG CHEST 2V
2 series · 2 of 2 positions shown · non-contrast
Comparison: 10/28/2012

CLINICAL DATA: Headache and fever

CHEST - 2 VIEW

[w chest lat]
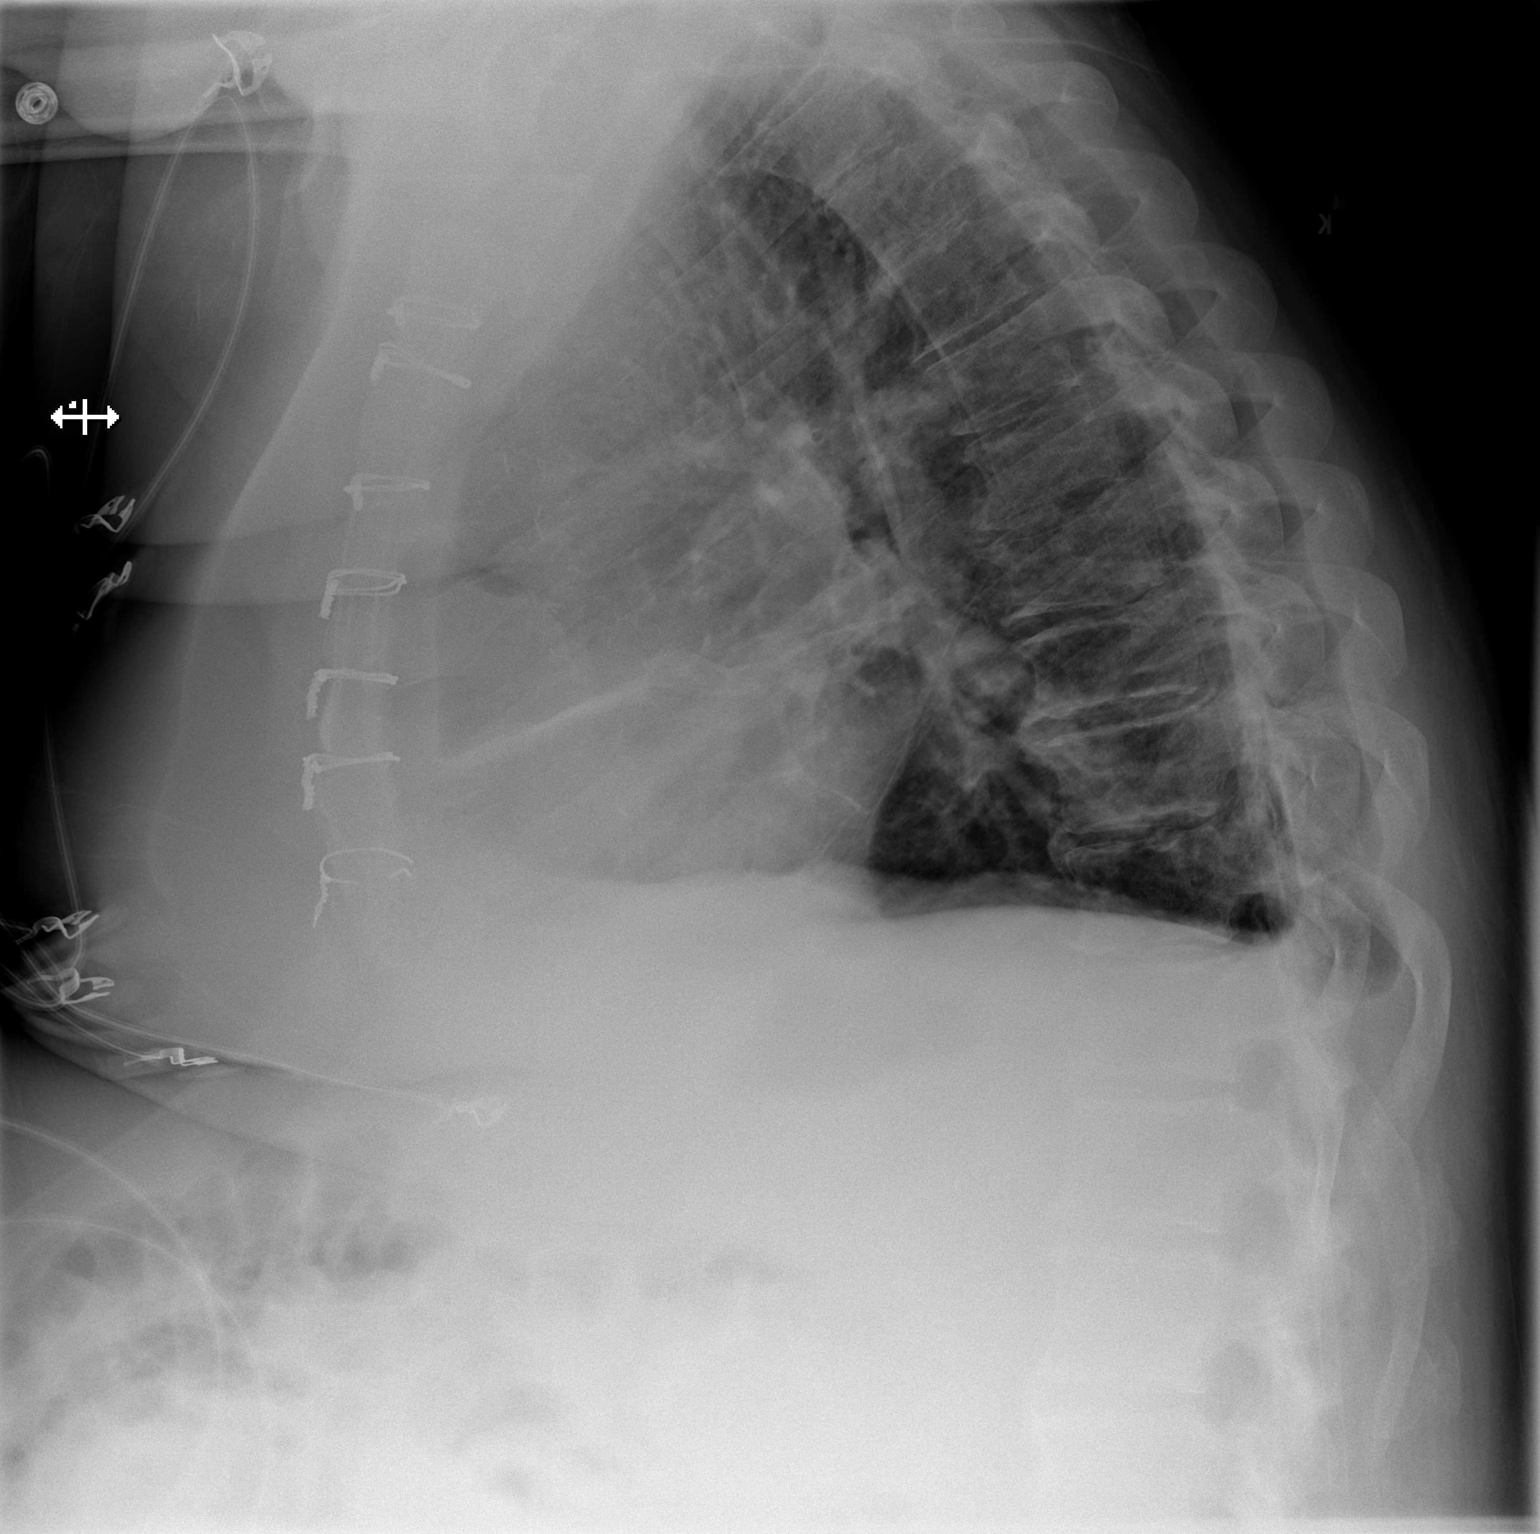

[w chest pa]
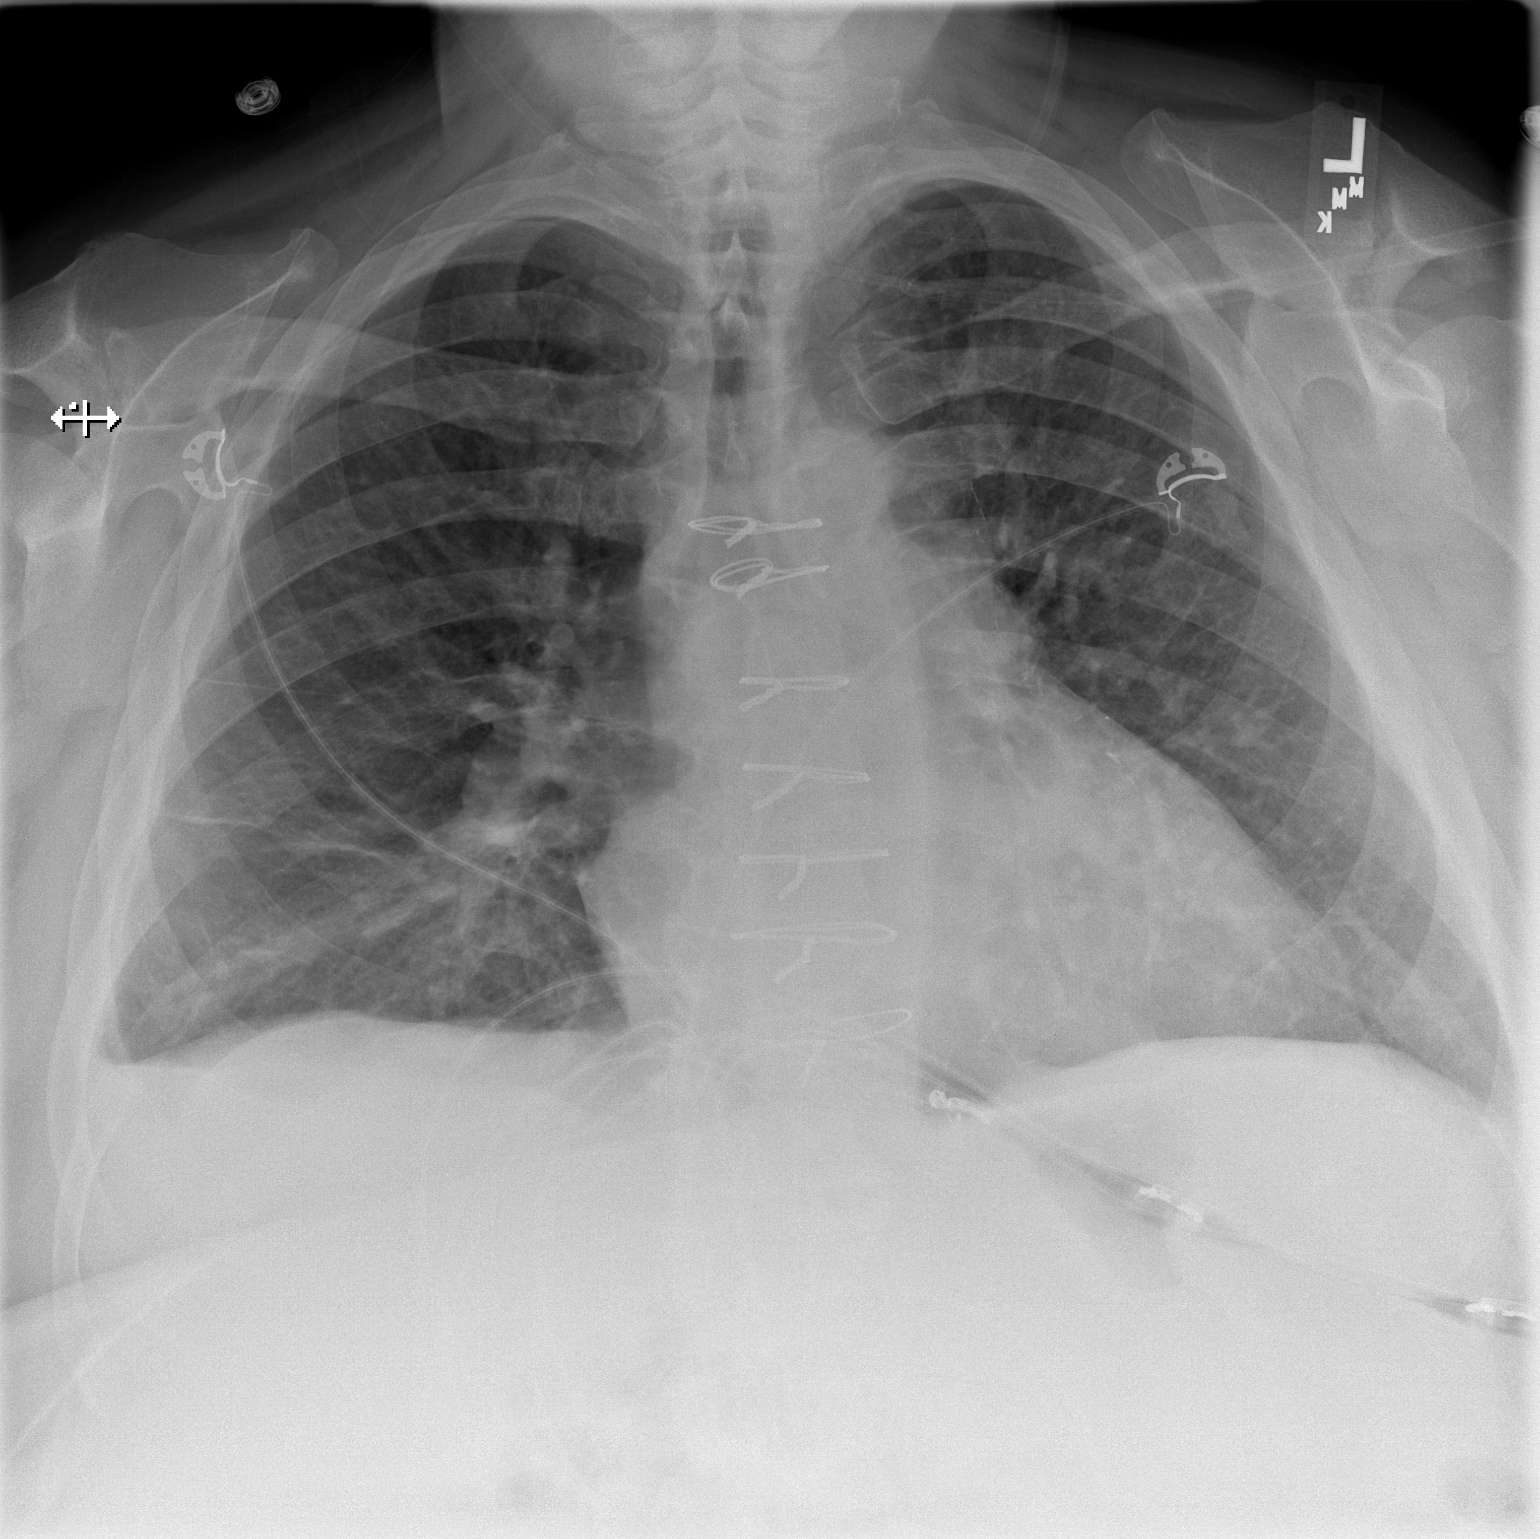

[2 of 2 positions shown; findings below may reference images not displayed]

FINDINGS: Mild cardiac enlargement.  The patient is status post
median sternotomy and CABG procedure.  There is a small right
pleural effusion.  Increased prominence of septal lines consistent
with interstitial edema.  No airspace consolidation.
IMPRESSION: 1.  Mild CHF.
2.  No evidence for pneumonia.

## 2013-07-30 ENCOUNTER — Other Ambulatory Visit: Payer: Self-pay | Admitting: Physical Medicine & Rehabilitation

## 2013-08-12 ENCOUNTER — Encounter: Payer: Self-pay | Admitting: Physical Medicine & Rehabilitation

## 2013-08-12 ENCOUNTER — Encounter
Payer: BC Managed Care – PPO | Attending: Physical Medicine & Rehabilitation | Admitting: Physical Medicine & Rehabilitation

## 2013-08-12 VITALS — BP 122/58 | HR 66 | Resp 16 | Ht 71.0 in | Wt 352.0 lb

## 2013-08-12 DIAGNOSIS — Y838 Other surgical procedures as the cause of abnormal reaction of the patient, or of later complication, without mention of misadventure at the time of the procedure: Secondary | ICD-10-CM | POA: Insufficient documentation

## 2013-08-12 DIAGNOSIS — Z9889 Other specified postprocedural states: Secondary | ICD-10-CM | POA: Insufficient documentation

## 2013-08-12 DIAGNOSIS — G510 Bell's palsy: Secondary | ICD-10-CM

## 2013-08-12 DIAGNOSIS — D333 Benign neoplasm of cranial nerves: Secondary | ICD-10-CM | POA: Insufficient documentation

## 2013-08-12 MED ORDER — HYDROCODONE-ACETAMINOPHEN 10-325 MG PO TABS: ORAL_TABLET | ORAL | Status: DC

## 2013-08-12 MED ORDER — TOPIRAMATE 100 MG PO TABS: 100.0000 mg | ORAL_TABLET | Freq: Every day | ORAL | Status: DC

## 2013-08-12 MED ORDER — CIPROFLOXACIN HCL 0.3 % OP OINT: TOPICAL_OINTMENT | Freq: Two times a day (BID) | OPHTHALMIC | Status: DC

## 2013-08-12 NOTE — Progress Notes (Signed)
Subjective:    Patient ID: [name], male    DOB: January 25, 1961, 52 y.o.   MRN: [id number]  HPI  [name] is back regarding his acoustic neuroma and associated deficits and pain. His pain seems to have plateaued generally. He still has sensitivity over the right side of his face/eye. The hydrocodone seems to hold him for 6-7 hours at a time. He is on the topamax, but only is taking 50mg  at night.   He is walking with his rolling walker. He has had no falls.   His cellulitis has cleared up in his right foot.    He is wearing a patch over his right eye. He's not wearing anything occlusive. He uses eye gtts and gel to help with lubrication. It does become irritated at times.   Pain Inventory Average Pain 7 Pain Right Now 7 My pain is tingling and aching  In the last 24 hours, has pain interfered with the following? General activity n/a Relation with others n/a Enjoyment of life n/a What TIME of day is your pain at its worst? daytime Sleep (in general) Poor  Pain is worse with: standing Pain improves with: medication Relief from Meds: 5  Mobility use a walker how many minutes can you walk? 10 ability to climb steps?  no do you drive?  no needs help with transfers Do you have any goals in this area?  no  Function disabled: date disabled 10/12/2012 I need assistance with the following:  dressing and bathing  Neuro/Psych bladder control problems  Prior Studies Any changes since last visit?  no  Physicians involved in your care Any changes since last visit?  no   Family History  Problem Relation Age of Onset  . CAD Father    History   Social History  . Marital Status: Married    Spouse Name: N/A    Number of Children: N/A  . Years of Education: N/A   Social History Main Topics  . Smoking status: Heavy Tobacco Smoker -- 0.50 packs/day for 20 years    Types: Cigarettes  . Smokeless tobacco: Current User     Comment: Rare tobacco.  "Puff or two a  day"  . Alcohol Use: No  . Drug Use: No  . Sexual Activity: Yes   Other Topics Concern  . None   Social History Narrative  . None   Past Surgical History  Procedure Laterality Date  . Coronary artery bypass graft      LIMA to LAD 2009  . Umbilical hernia repair    . Anal fistulectomy    . Retrosigmoid craniectomy for tumor resection Right 10/11/2012    Procedure: RETROSIGMOID CRANIECTOMY FOR TUMOR RESECTION;  Surgeon: [name], MD;  Location: [place] [facility];  Service: Neurosurgery;  Laterality: Right;  Craniotomy for acoustic neuroma  . Tracheostomy tube placement N/A 10/11/2012    Procedure: TRACHEOSTOMY;  Surgeon: Ascencion Dike, MD;  Location: [facility];  Service: ENT;  Laterality: N/A;  . Acoustic neuroma resection N/A 10/11/2012    Procedure: ACOUSTIC NEUROMA RESECTION;  Surgeon: Ascencion Dike, MD;  Location: [place] [facility];  Service: ENT;  Laterality: N/A;  . Craniotomy Right 11/20/2012    Procedure: CRANIOTOMY REPAIR DURAL/CENTRAL SPINAL FLUID LEAK;  Surgeon: [name], MD;  Location: [place] [facility];  Service: Neurosurgery;  Laterality: Right;  . Placement of lumbar drain N/A 11/20/2012    Procedure: Attempted PLACEMENT OF LUMBAR DRAIN;  Surgeon: [name],  MD;  Location: [place] [facility];  Service: Neurosurgery;  Laterality: N/A;   Past Medical History  Diagnosis Date  . CAD (coronary artery disease)     Cath 09, 99% LAD  . Cardiomyopathy, ischemic     Ischemic  . Obesity   . Fibromyalgia   . Shortness of breath   . CHF (congestive heart failure)   . Hypertension     dr Percival Spanish  . Cyst near coccyx   . Diabetes mellitus without complication     borderline  . Cyst near tailbone   . Kidney stones   . Myocardial infarction   . Headache(784.0)     with lights   BP 122/58  Pulse 66  Resp 16  Ht 5\' 11"  (1.803 m)  Wt 352 lb (159.666 kg)  BMI 49.12 kg/m2  SpO2 90%     Review of Systems  Cardiovascular: Positive for leg swelling.  Genitourinary: Positive for  difficulty urinating.  All other systems reviewed and are negative.       Objective:   Physical Exam  General: Alert and oriented x 3, No apparent distress. Wearing an eye patch on right.  HEENT: Head is normocephalic, atraumatic, PERRLA, EOMI, sclera are injected and still Irritated/red but improved from last visit.  oral mucosa pink and moist, dentition intact, ext ear canals clear.  Neck: Supple without JVD or lymphadenopathy  Heart: Reg rate and rhythm. No murmurs rubs or gallops  Chest: CTA bilaterally without wheezes, rales, or rhonchi; no distress  Abdomen: Soft, non-tender, non-distended, bowel sounds positive.  Extremities: No clubbing, cyanosis, 2+ edema RLE, 1+ LLE with dry skin at soles of feet. Pulses are 2+  Skin: Clean and intact without signs of breakdown. Healing surgical scar right scalp. Right foot normal color, warm.  No obvious breakdown.  Neuro: Cognitively he's at baseline. He's a bit impulsive. He continues to have decreased lid closure, brow bending and facial weakness on the right (minimal improvement). No hearing on the right. Strength is 4 to 5/5 all 4 extremities. Right limb ataxia still noted. His balance is improved as a whole and he walked fairly stable without his rolling walker today.  Musculoskeletal: Full ROM, No pain with AROM or PROM in the neck, trunk, or extremities. Posture appropriate  Psych: Pt's affect is appropriate. Pt is cooperative    Assessment & Plan:   1. 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   2. Headaches right temporal area related to tumor/surgery.  3. Lower extremity edema, morbid obesity  4. Cellulitis Right foot --resolved    Plan:  1. Continue eye gtts, , eye patch. Still recommend an optho referall.  2. Gave him an rx for cipro gtts to use for flare ups in his right eye. My recommendation now would be for  more aggressive occlusion of the eye with a goggle or better patch. He is hesitant to use goggles however due to the pressre the goggle and strap put on the side of his face. I have recommended that he look at a gold weight for the right eye lid. i believe dr. Benjamine Mola has recommended this as well. He will follow with dr. Benjamine Mola if he would like a referral to [facility].   3. Refilled hydrocodone for pain control #120. Gave second rx for next month.  4. Will change topamax to 100mg  qhs to see if this helps his compliance.   5. Follow up with me  in 2 months. 30 minutes of face to face patient care time were spent during this visit. All questions were encouraged and answered.  Driving is still not advised although I believe he has taken his truck out locally. I recommend continued use of his walker.          Assessment & Plan:

## 2013-08-12 NOTE — Patient Instructions (Signed)
CALL ME WITH ANY PROBLEMS OR QUESTIONS (#[phone]).    HAPPY HOLIDAYS!!!!!   

## 2013-08-22 ENCOUNTER — Telehealth: Payer: Self-pay

## 2013-08-22 NOTE — Telephone Encounter (Signed)
Contacted pharmacy and give them a verbal for the ciprofloxacin ointment. Then contacted the patient to inform him. The patient then stated he needed a different medication called in to the pharmacy because the cipro ointment was too expensive. Please advise.

## 2013-08-22 NOTE — Telephone Encounter (Signed)
Patient called and said that the Pharmacy stated they never received a escript for ciprofloxacin ointment. Patient is requesting the medication be called in to the pharmacy again.

## 2013-08-25 NOTE — Telephone Encounter (Signed)
He would need to see an optho or ENT physician for further abx gtts for eyes. Sorry

## 2013-08-25 NOTE — Telephone Encounter (Signed)
Tried to contact patient regarding his eye drop rx.  Unable to leave message.

## 2013-10-13 ENCOUNTER — Encounter (INDEPENDENT_AMBULATORY_CARE_PROVIDER_SITE_OTHER): Payer: Self-pay

## 2013-10-13 ENCOUNTER — Encounter
Payer: BC Managed Care – PPO | Attending: Physical Medicine & Rehabilitation | Admitting: Physical Medicine & Rehabilitation

## 2013-10-13 ENCOUNTER — Encounter: Payer: Self-pay | Admitting: Physical Medicine & Rehabilitation

## 2013-10-13 VITALS — BP 149/79 | HR 58 | Resp 16 | Ht 71.0 in | Wt 360.0 lb

## 2013-10-13 DIAGNOSIS — G939 Disorder of brain, unspecified: Secondary | ICD-10-CM | POA: Insufficient documentation

## 2013-10-13 DIAGNOSIS — Z9889 Other specified postprocedural states: Secondary | ICD-10-CM | POA: Insufficient documentation

## 2013-10-13 DIAGNOSIS — R519 Headache, unspecified: Secondary | ICD-10-CM | POA: Insufficient documentation

## 2013-10-13 DIAGNOSIS — D333 Benign neoplasm of cranial nerves: Secondary | ICD-10-CM | POA: Insufficient documentation

## 2013-10-13 DIAGNOSIS — Y838 Other surgical procedures as the cause of abnormal reaction of the patient, or of later complication, without mention of misadventure at the time of the procedure: Secondary | ICD-10-CM | POA: Insufficient documentation

## 2013-10-13 DIAGNOSIS — G510 Bell's palsy: Secondary | ICD-10-CM

## 2013-10-13 DIAGNOSIS — R51 Headache: Secondary | ICD-10-CM | POA: Insufficient documentation

## 2013-10-13 MED ORDER — TOPIRAMATE 100 MG PO TABS: 200.0000 mg | ORAL_TABLET | Freq: Every day | ORAL | Status: DC

## 2013-10-13 MED ORDER — HYDROCODONE-ACETAMINOPHEN 10-325 MG PO TABS: ORAL_TABLET | ORAL | Status: DC

## 2013-10-13 MED ORDER — BACLOFEN 10 MG PO TABS: 10.0000 mg | ORAL_TABLET | Freq: Three times a day (TID) | ORAL | Status: DC | PRN

## 2013-10-13 NOTE — Patient Instructions (Signed)
YOU NEED TO WORK ON REGULAR MASSAGE AND DESENSITIZATION TO THE AREA AROUND YOUR RIGHT EYE AND TEMPLE

## 2013-10-13 NOTE — Progress Notes (Signed)
Subjective:    Patient ID: [name], male    DOB: 03-05-1961, 53 y.o.   MRN: [id number]  HPI  Mr. Mayernik is back regarding his chronic pain and vestibular schwannoma. He reports increased shock like sensations in the right temple area. He is taking his medications a prescribed. He feels that the right temporal area is throbbing. He says he can feel his pulse. Over the last two to three days it has been worse.  He is also complaining of spasms in this area.   He is trying to be diligent with the care of his right eye. He washes it frequently. He couldn't afford eye gtts.   He is taking his hydrocodone as rx'ed.       Pain Inventory Average Pain 6 Pain Right Now 6 My pain is sharp  In the last 24 hours, has pain interfered with the following? General activity 8 Relation with others 8 Enjoyment of life 8 What TIME of day is your pain at its worst? day and night Sleep (in general) Fair  Pain is worse with: walking Pain improves with: rest Relief from Meds: 5  Mobility use a walker how many minutes can you walk? 5 ability to climb steps?  no do you drive?  yes Do you have any goals in this area?  no  Function disabled: date disabled . I need assistance with the following:  dressing and bathing  Neuro/Psych numbness tremor tingling trouble walking spasms loss of taste or smell  Prior Studies CT/MRI  Physicians involved in your care Any changes since last visit?  no   Family History  Problem Relation Age of Onset  . CAD Father    History   Social History  . Marital Status: Married    Spouse Name: N/A    Number of Children: N/A  . Years of Education: N/A   Social History Main Topics  . Smoking status: Heavy Tobacco Smoker -- 0.50 packs/day for 20 years    Types: Cigarettes  . Smokeless tobacco: Current User     Comment: Rare tobacco.  "[name]"  . Alcohol Use: No  . Drug Use: No  . Sexual Activity: Yes   Other Topics Concern   . None   Social History Narrative  . None   Past Surgical History  Procedure Laterality Date  . Coronary artery bypass graft      LIMA to LAD 2009  . Umbilical hernia repair    . Anal fistulectomy    . Retrosigmoid craniectomy for tumor resection Right 10/11/2012    Procedure: RETROSIGMOID CRANIECTOMY FOR TUMOR RESECTION;  Surgeon: [name], MD;  Location: [place] [facility];  Service: Neurosurgery;  Laterality: Right;  Craniotomy for acoustic neuroma  . Tracheostomy tube placement N/A 10/11/2012    Procedure: TRACHEOSTOMY;  Surgeon: Ascencion Dike, MD;  Location: [facility];  Service: ENT;  Laterality: N/A;  . Acoustic neuroma resection N/A 10/11/2012    Procedure: ACOUSTIC NEUROMA RESECTION;  Surgeon: Ascencion Dike, MD;  Location: [place] [facility];  Service: ENT;  Laterality: N/A;  . Craniotomy Right 11/20/2012    Procedure: CRANIOTOMY REPAIR DURAL/CENTRAL SPINAL FLUID LEAK;  Surgeon: [name], MD;  Location: [place] [facility];  Service: Neurosurgery;  Laterality: Right;  . Placement of lumbar drain N/A 11/20/2012    Procedure: Attempted PLACEMENT OF LUMBAR DRAIN;  Surgeon: [name], MD;  Location: [place] [facility];  Service: Neurosurgery;  Laterality: N/A;  Past Medical History  Diagnosis Date  . CAD (coronary artery disease)     Cath 09, 99% LAD  . Cardiomyopathy, ischemic     Ischemic  . Obesity   . Fibromyalgia   . Shortness of breath   . CHF (congestive heart failure)   . Hypertension     dr Percival Spanish  . Cyst near coccyx   . Diabetes mellitus without complication     borderline  . Cyst near tailbone   . Kidney stones   . Myocardial infarction   . Headache(784.0)     with lights   BP 149/79  Pulse 58  Resp 16  Ht 5\' 11"  (1.803 m)  Wt 360 lb (163.295 kg)  BMI 50.23 kg/m2  SpO2 96%  Opioid Risk Score:   Fall Risk Score: Moderate Fall Risk (6-13 points) (patient educated handout given)   Review of Systems  Constitutional: Positive for appetite change.    Gastrointestinal: Positive for constipation.  Musculoskeletal: Positive for gait problem.  Neurological: Positive for tremors and numbness.  All other systems reviewed and are negative.       Objective:   Physical Exam  General: Alert and oriented x 3, No apparent distress. Wearing an eye patch on right.  HEENT: Head is normocephalic, atraumatic, PERRLA, EOMI, sclera are injected and still Irritated/red but improved from last visit. oral mucosa pink and moist, dentition intact, ext ear canals clear.  Neck: Supple without JVD or lymphadenopathy  Heart: Reg rate and rhythm. No murmurs rubs or gallops  Chest: CTA bilaterally without wheezes, rales, or rhonchi; no distress  Abdomen: Soft, non-tender, non-distended, bowel sounds positive.  Extremities: No clubbing, cyanosis, 2+ edema RLE, 1+ LLE with dry skin at soles of feet. Pulses are 2+  Skin: Clean and intact without signs of breakdown. Healing surgical scar right scalp. Right foot normal color, warm. No obvious breakdown.  Neuro: Cognitively he's at baseline. He's a bit impulsive. He continues to have decreased lid closure, brow bending and facial weakness on the right (minimal improvement). No hearing on the right. Strength is 4 to 5/5 all 4 extremities. Right limb ataxia still noted. His balance is improved as a whole and he walked fairly stable without his rolling walker today. He has atrophy over the right temple. Area is sensitive.  Musculoskeletal: Full ROM, No pain with AROM or PROM in the neck, trunk, or extremities. Posture appropriate  Psych: Pt's affect is appropriate. Pt is cooperative  Assessment & Plan:   1. 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  2. Headaches right temporal area related to tumor/surgery.  3. Lower extremity edema, morbid obesity  4. Cellulitis Right foot --resolved    Plan:  1. Continue  eye gtts, , eye patch. Still recommend an optho referral which he hasn't done 2. Consider elavil QHS if topamax unhelpful. 3. Refilled hydrocodone for pain control #120. Gave second rx for next month. Consider percocet trial. 4. Will change topamax to 200mg  qhs. Added baclofen for facial spasms. 5. Follow up with me in 2 months. 30 minutes of face to face patient care time were spent during this visit. All questions were encouraged and answered. Driving is still not advised although I believe he has taken his truck out locally. I recommend continued use of his walker.

## 2013-12-12 ENCOUNTER — Encounter: Payer: Self-pay | Admitting: Physical Medicine & Rehabilitation

## 2013-12-12 ENCOUNTER — Encounter
Payer: BC Managed Care – PPO | Attending: Physical Medicine & Rehabilitation | Admitting: Physical Medicine & Rehabilitation

## 2013-12-12 VITALS — BP 166/92 | HR 72 | Resp 14 | Ht 71.0 in | Wt 352.0 lb

## 2013-12-12 DIAGNOSIS — G510 Bell's palsy: Secondary | ICD-10-CM | POA: Insufficient documentation

## 2013-12-12 DIAGNOSIS — Y838 Other surgical procedures as the cause of abnormal reaction of the patient, or of later complication, without mention of misadventure at the time of the procedure: Secondary | ICD-10-CM | POA: Insufficient documentation

## 2013-12-12 DIAGNOSIS — Z9889 Other specified postprocedural states: Secondary | ICD-10-CM

## 2013-12-12 DIAGNOSIS — S0450XA Injury of facial nerve, unspecified side, initial encounter: Secondary | ICD-10-CM

## 2013-12-12 DIAGNOSIS — G939 Disorder of brain, unspecified: Secondary | ICD-10-CM | POA: Insufficient documentation

## 2013-12-12 DIAGNOSIS — R519 Headache, unspecified: Secondary | ICD-10-CM

## 2013-12-12 DIAGNOSIS — D333 Benign neoplasm of cranial nerves: Secondary | ICD-10-CM | POA: Insufficient documentation

## 2013-12-12 DIAGNOSIS — R51 Headache: Secondary | ICD-10-CM | POA: Insufficient documentation

## 2013-12-12 MED ORDER — HYDROCODONE-ACETAMINOPHEN 10-325 MG PO TABS: ORAL_TABLET | ORAL | Status: DC

## 2013-12-12 MED ORDER — TOPIRAMATE 100 MG PO TABS
200.0000 mg | ORAL_TABLET | Freq: Every day | ORAL | Status: DC
Start: 2013-12-12 — End: 2014-04-17

## 2013-12-12 MED ORDER — BACLOFEN 10 MG PO TABS: 10.0000 mg | ORAL_TABLET | Freq: Three times a day (TID) | ORAL | Status: DC | PRN

## 2013-12-12 MED ORDER — TOPIRAMATE 100 MG PO TABS: 200.0000 mg | ORAL_TABLET | Freq: Every day | ORAL | Status: DC

## 2013-12-12 NOTE — Progress Notes (Signed)
Subjective:    Patient ID: [name], male    DOB: 08-May-1961, 53 y.o.   MRN: [id number]  HPI  [name] is back regarding his balance issues and pain. He seems to be having better pain control with the topamax increase and addition of baclofen.   He fell last week while he was holding his I pad while walking---lost his balance and landed on his left side.   He continues to use drops and ointment on his eye. He wears his eye patch daily.   He takes his hydrocodone 4 x daily.    Pain Inventory Average Pain 6 Pain Right Now 6 My pain is intermittent and aching  In the last 24 hours, has pain interfered with the following? General activity 2 Relation with others 2 Enjoyment of life 2 What TIME of day is your pain at its worst? daytime Sleep (in general) Fair  Pain is worse with: bending Pain improves with: rest Relief from Meds: 9  Mobility walk with assistance use a walker how many minutes can you walk? 1 ability to climb steps?  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  no  Physicians involved in your care Any changes since last visit?  no   Family History  Problem Relation Age of Onset  . CAD Father    History   Social History  . Marital Status: Married    Spouse Name: N/A    Number of Children: N/A  . Years of Education: N/A   Social History Main Topics  . Smoking status: Heavy Tobacco Smoker -- 0.50 packs/day for 20 years    Types: Cigarettes  . Smokeless tobacco: Current User     Comment: Rare tobacco.  "[name]"  . Alcohol Use: No  . Drug Use: No  . Sexual Activity: Yes   Other Topics Concern  . None   Social History Narrative  . None   Past Surgical History  Procedure Laterality Date  . Coronary artery bypass graft      LIMA to LAD 2009  .  Umbilical hernia repair    . Anal fistulectomy    . Retrosigmoid craniectomy for tumor resection Right 10/11/2012    Procedure: RETROSIGMOID CRANIECTOMY FOR TUMOR RESECTION;  Surgeon: [name], MD;  Location: [place] [facility];  Service: Neurosurgery;  Laterality: Right;  Craniotomy for acoustic neuroma  . Tracheostomy tube placement N/A 10/11/2012    Procedure: TRACHEOSTOMY;  Surgeon: Ascencion Dike, MD;  Location: [facility];  Service: ENT;  Laterality: N/A;  . Acoustic neuroma resection N/A 10/11/2012    Procedure: ACOUSTIC NEUROMA RESECTION;  Surgeon: Ascencion Dike, MD;  Location: [place] [facility];  Service: ENT;  Laterality: N/A;  . Craniotomy Right 11/20/2012    Procedure: CRANIOTOMY REPAIR DURAL/CENTRAL SPINAL FLUID LEAK;  Surgeon: [name], MD;  Location: [place] [facility];  Service: Neurosurgery;  Laterality: Right;  . Placement of lumbar drain N/A 11/20/2012    Procedure: Attempted PLACEMENT OF LUMBAR DRAIN;  Surgeon: [name], MD;  Location: [place] [facility];  Service: Neurosurgery;  Laterality: N/A;   Past Medical History  Diagnosis Date  . CAD (coronary artery disease)     Cath 09, 99% LAD  . Cardiomyopathy, ischemic     Ischemic  . Obesity   .  Fibromyalgia   . Shortness of breath   . CHF (congestive heart failure)   . Hypertension     dr Percival Spanish  . Cyst near coccyx   . Diabetes mellitus without complication     borderline  . Cyst near tailbone   . Kidney stones   . Myocardial infarction   . Headache(784.0)     with lights   BP 166/92  Pulse 72  Resp 14  Ht 5\' 11"  (1.803 m)  Wt 352 lb (159.666 kg)  BMI 49.12 kg/m2  SpO2 90%  Opioid Risk Score:   Fall Risk Score: Moderate Fall Risk (6-13 points) (pt educated and given a brochure on fall risk previously)    Review of Systems  Musculoskeletal: Positive for gait problem.  Neurological: Positive for tremors and numbness.       Tingling  All other systems reviewed and are negative.      Objective:   Physical  Exam  General: Alert and oriented x 3, No apparent distress. Wearing an eye patch on right.  HEENT: Head is normocephalic, atraumatic, PERRLA, EOMI, sclera and conjunctiva much improved with decreased irritation. Visual acuity betteroral mucosa pink and moist, dentition intact, ext ear canals clear.  Neck: Supple without JVD or lymphadenopathy  Heart: Reg rate and rhythm. No murmurs rubs or gallops  Chest: CTA bilaterally without wheezes, rales, or rhonchi; no distress  Abdomen: Soft, non-tender, non-distended, bowel sounds positive.  Extremities: No clubbing, cyanosis, 2+ edema RLE, 1+ LLE with dry skin at soles of feet. Pulses are 2+  Skin: Clean and intact without signs of breakdown. Healing surgical scar right scalp. Right foot normal color, warm. No obvious breakdown.  Neuro: Cognitively he's at baseline. He's a bit impulsive. He continues to have decreased lid closure, brow bending and facial weakness on the right (minimal improvement). Gaze appears generally conjugate.  No hearing on the right. Strength is 4 to 5/5 all 4 extremities. Right limb ataxia still noted. His balance is improved as a whole and he walked fairly stable without his rolling walker today. He has atrophy over the right temple. Area is sensitive.  Musculoskeletal: Full ROM, No pain with AROM or PROM in the neck, trunk, or extremities. Posture appropriate  Psych: Pt's affect is appropriate. Pt is cooperative    Assessment & Plan:   1. 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  2. Headaches right temporal area related to tumor/surgery.  3. Lower extremity edema, morbid obesity  4. Cellulitis Right foot --resolved   Plan:  1. Continue eye gtts, abx ointment , eye patch. He won't do gold weight as they want to charge him "full price."  It looks much better overall 2. Maintain topamax at 200mg  qhs---mail order  rx written for 3 months.  3. Refilled hydrocodone for pain control #120. Gave second rx for next month.    4.  Baclofen 10mg  prn for spasms and when he feels pain/headaches coming on.  5. Follow up with me in 2 months. 15 minutes of face to face patient care time were spent during this visit. All questions were encouraged and answered. Driving is still not advised although I believe he has taken his truck out locally. I recommend continued use of his walker.

## 2013-12-12 NOTE — Patient Instructions (Signed)
CONTINUE TO FINE TUNE YOUR REGIMEN FOR YOUR EYE CARE. TRY DISTILLED WATER TO SEE IF THAT MAKES A DIFFERENCE

## 2013-12-12 NOTE — Addendum Note (Signed)
Addended by: Caro Hight on: 12/12/2013 11:48 AM   Modules accepted: Orders

## 2014-01-02 ENCOUNTER — Telehealth: Payer: Self-pay

## 2014-01-02 NOTE — Telephone Encounter (Signed)
Pharmacist from [facility] called to inform us that patient received Hydrocodone #120 from Dr. Naaman Plummer on 4/24 and on 4/30 received #120 from Dr. Arelia Sneddon (pt's PCP). Pharmacist is holding the RX from Dr. Naaman Plummer until he hears back from [place].

## 2014-01-02 NOTE — Telephone Encounter (Signed)
I spoke with pharmacist and they have been filling the hydrocodone rx from our office but nothing is showing up in the [facility] but the one rx from Dr Arelia Sneddon on 12/18/13 filled at [facility].  They will follow up on why the drugs are not getting reported to [facility] and hold the rx from [place] until we talk to him on Monday.  We will notify pharmacy of whether to destroy the rx or hold per Dr Naaman Plummer.

## 2014-01-05 ENCOUNTER — Telehealth: Payer: Self-pay | Admitting: *Deleted

## 2014-01-05 NOTE — Telephone Encounter (Signed)
Destroy my rx for this month. I will be willing to rx him further medication as long as he's absolutely clear that i will be the sole presriber going forward.

## 2014-01-05 NOTE — Telephone Encounter (Signed)
[name] called about not getting his topamax from his mail order pharmacy and he is out.  The number he has is for the fax # so he has been unable to get through.  I have given him the phone number to [facility] and told him to call them to find out why he has not gotten his rx through the mail.  [facility] and he says he cannot get it there.  [facility] and [facility] ins allows 2 fills at local and then has to go through mail order.  Eric from [facility] is going to call the pharmacy and see if he can get an override so that he can get a months supply until the mail order arrives.   I also spoke with [name] about him getting the hydrocodone from Dr Arelia Sneddon.  He said he just wanted to have an extra amount on hand in case he runs out.  I informed him that he is under contract from our office that states he cannot get pain medication from any other physician and this is a violation.  He said multiple times he did not know. I am going to mail him a copy of his contract for him to review.  In the meantime Dr Naaman Plummer has not reviewed the message about him getting meds from Dr Arelia Sneddon and I told [name] I cannot promise that he will not be discharged.  It will be up to Dr Naaman Plummer. I reinforced that he is NOT to get narcotics from any other physician and you cannot just get an extra prescription to have on hand of a CII narcotic.  The face that he asked for it from another physician and took it to a separate pharmacy makes him look cuplable even though he is claiming ignorance of rules.

## 2014-01-05 NOTE — Telephone Encounter (Signed)
Contacted pharmacy to inform them to destroy Hydrocodone RX for this month per Dr. Naaman Plummer. Golda Acre has spoke with patient and informed him that he can not get medication from other providers.

## 2014-01-14 ENCOUNTER — Telehealth: Payer: Self-pay | Admitting: *Deleted

## 2014-01-14 NOTE — Telephone Encounter (Signed)
Calling to see when he can get his hydrocodone refilled. He has his second rx that was given to him at his last visit with Dr Naaman Plummer and he wants to know if he can fill it now. I told him that he could go ahead and fill it now since the last one was from [place] on 12/15/13.

## 2014-02-13 ENCOUNTER — Telehealth: Payer: Self-pay | Admitting: *Deleted

## 2014-02-13 ENCOUNTER — Encounter: Payer: BC Managed Care – PPO | Admitting: Physical Medicine & Rehabilitation

## 2014-02-13 NOTE — Telephone Encounter (Signed)
His ride is going to be 30 minutes late and he has an appt with [name] at 10:20.  He will need to reschedule his appt.Marland Kitchen He was placed with [name] on Monday 02/16/14.

## 2014-02-16 ENCOUNTER — Encounter: Payer: Self-pay | Admitting: Registered Nurse

## 2014-02-16 ENCOUNTER — Encounter: Payer: BC Managed Care – PPO | Attending: Physical Medicine & Rehabilitation | Admitting: Registered Nurse

## 2014-02-16 VITALS — BP 152/94 | HR 66 | Resp 16 | Ht 71.0 in | Wt 352.0 lb

## 2014-02-16 DIAGNOSIS — S049XXS Injury of unspecified cranial nerve, sequela: Secondary | ICD-10-CM

## 2014-02-16 DIAGNOSIS — R519 Headache, unspecified: Secondary | ICD-10-CM

## 2014-02-16 DIAGNOSIS — Z9889 Other specified postprocedural states: Secondary | ICD-10-CM

## 2014-02-16 DIAGNOSIS — G510 Bell's palsy: Secondary | ICD-10-CM

## 2014-02-16 DIAGNOSIS — R51 Headache: Secondary | ICD-10-CM

## 2014-02-16 DIAGNOSIS — Z5181 Encounter for therapeutic drug level monitoring: Secondary | ICD-10-CM

## 2014-02-16 DIAGNOSIS — D333 Benign neoplasm of cranial nerves: Secondary | ICD-10-CM

## 2014-02-16 DIAGNOSIS — G939 Disorder of brain, unspecified: Secondary | ICD-10-CM

## 2014-02-16 DIAGNOSIS — Z79899 Other long term (current) drug therapy: Secondary | ICD-10-CM

## 2014-02-16 DIAGNOSIS — S0451XS Injury of facial nerve, right side, sequela: Secondary | ICD-10-CM

## 2014-02-16 DIAGNOSIS — Y838 Other surgical procedures as the cause of abnormal reaction of the patient, or of later complication, without mention of misadventure at the time of the procedure: Secondary | ICD-10-CM | POA: Insufficient documentation

## 2014-02-16 MED ORDER — HYDROCODONE-ACETAMINOPHEN 10-325 MG PO TABS: ORAL_TABLET | ORAL | Status: DC

## 2014-02-16 NOTE — Progress Notes (Signed)
Subjective:    Patient ID: [name], male    DOB: 1961/03/02, 53 y.o.   MRN: [id number]  HPI: [name] is a 53 year old male who returns for follow up for chronic pain and medication refill. He says he's having right facial pain. He rates his pain 7. His current exercise regime is walking. He uses his cadillac walker.Wearing right eye patch.  Pain Inventory Average Pain 7 Pain Right Now 7 My pain is tingling  In the last 24 hours, has pain interfered with the following? General activity 0 Relation with others 0 Enjoyment of life 3 What TIME of day is your pain at its worst? night Sleep (in general) Fair  Pain is worse with: walking and standing Pain improves with: rest Relief from Meds: 9  Mobility use a walker how many minutes can you walk? 10 ability to climb steps?  no do you drive?  yes  Function not employed: date last employed 08/2012 disabled: date disabled 09/2013 I need assistance with the following:  dressing and bathing  Neuro/Psych tingling  Prior Studies Any changes since last visit?  no  Physicians involved in your care Any changes since last visit?  no   Family History  Problem Relation Age of Onset  . CAD Father    History   Social History  . Marital Status: Married    Spouse Name: N/A    Number of Children: N/A  . Years of Education: N/A   Social History Main Topics  . Smoking status: Heavy Tobacco Smoker -- 0.50 packs/day for 20 years    Types: Cigarettes  . Smokeless tobacco: Current User     Comment: Rare tobacco.  "[name]"  . Alcohol Use: No  . Drug Use: No  . Sexual Activity: Yes   Other Topics Concern  . None   Social History Narrative  . None   Past Surgical History  Procedure Laterality Date  . Coronary artery bypass graft      LIMA to LAD 2009  . Umbilical hernia repair    . Anal fistulectomy    . Retrosigmoid craniectomy for tumor resection Right 10/11/2012    Procedure:  RETROSIGMOID CRANIECTOMY FOR TUMOR RESECTION;  Surgeon: [name], MD;  Location: [place] [facility];  Service: Neurosurgery;  Laterality: Right;  Craniotomy for acoustic neuroma  . Tracheostomy tube placement N/A 10/11/2012    Procedure: TRACHEOSTOMY;  Surgeon: Ascencion Dike, MD;  Location: [facility];  Service: ENT;  Laterality: N/A;  . Acoustic neuroma resection N/A 10/11/2012    Procedure: ACOUSTIC NEUROMA RESECTION;  Surgeon: Ascencion Dike, MD;  Location: [place] [facility];  Service: ENT;  Laterality: N/A;  . Craniotomy Right 11/20/2012    Procedure: CRANIOTOMY REPAIR DURAL/CENTRAL SPINAL FLUID LEAK;  Surgeon: [name], MD;  Location: [place] [facility];  Service: Neurosurgery;  Laterality: Right;  . Placement of lumbar drain N/A 11/20/2012    Procedure: Attempted PLACEMENT OF LUMBAR DRAIN;  Surgeon: [name], MD;  Location: [place] [facility];  Service: Neurosurgery;  Laterality: N/A;   Past Medical History  Diagnosis Date  . CAD (coronary artery disease)     Cath 09, 99% LAD  . Cardiomyopathy, ischemic     Ischemic  . Obesity   . Fibromyalgia   . Shortness of breath   . CHF (congestive heart failure)   . Hypertension     dr Percival Spanish  . Cyst near coccyx   .  Diabetes mellitus without complication     borderline  . Cyst near tailbone   . Kidney stones   . Myocardial infarction   . Headache(784.0)     with lights   BP 152/94  Pulse 66  Resp 16  Ht 5\' 11"  (1.803 m)  Wt 352 lb (159.666 kg)  BMI 49.12 kg/m2  SpO2 92%  Opioid Risk Score:   Fall Risk Score: Moderate Fall Risk (6-13 points) (patient educated handout declined)   Review of Systems  Gastrointestinal: Positive for constipation.  Neurological:       Tingling  All other systems reviewed and are negative.      Objective:   Physical Exam  Nursing note and vitals reviewed. Constitutional: He is oriented to person, place, and time. He appears well-developed and well-nourished.  Obese  HENT:  Head: Normocephalic and  atraumatic.  Wearing Right Eye Patch Right Facial Droop Right facial tenderness noted with palpation  Neck: Normal range of motion. Neck supple.  Cardiovascular: Normal rate, regular rhythm and normal heart sounds.   Pulmonary/Chest: Effort normal and breath sounds normal.  Musculoskeletal: He exhibits edema.  Normal Muscle Bulk and Muscle Testing Reveals: Upper and Lower Extremities: Full ROM and Muscle strength 5/5 Back without spinal or paraspinal tenderness Arises from chair with ease Uses cadillac walker Narrow based gait  Neurological: He is alert and oriented to person, place, and time.  Skin: Skin is warm and dry.          Assessment & Plan:  1. 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 Refilled: Hydrocodone 10/325mg  one tablet every 6 hours as needed #120. Second script given. Continue eye gtts, ointment and patch. 2. Headaches right temporal area related to tumor/surgery. No complaints voiced today. Continue Topamax 3. Lower extremity edema, morbid obesity. On Lasix 40 mg daily: PMD Following.  20 minutes of face to face patient care time was spent during this visit. All questions were encouraged and answered.  F/U in 2 months

## 2014-04-15 ENCOUNTER — Ambulatory Visit: Payer: BC Managed Care – PPO | Admitting: Registered Nurse

## 2014-04-17 ENCOUNTER — Encounter: Payer: BC Managed Care – PPO | Attending: Physical Medicine & Rehabilitation | Admitting: Registered Nurse

## 2014-04-17 ENCOUNTER — Encounter: Payer: Self-pay | Admitting: Registered Nurse

## 2014-04-17 VITALS — BP 155/71 | HR 74 | Resp 14 | Ht 71.0 in | Wt 346.0 lb

## 2014-04-17 DIAGNOSIS — Z9889 Other specified postprocedural states: Secondary | ICD-10-CM

## 2014-04-17 DIAGNOSIS — G939 Disorder of brain, unspecified: Secondary | ICD-10-CM

## 2014-04-17 DIAGNOSIS — G510 Bell's palsy: Secondary | ICD-10-CM | POA: Insufficient documentation

## 2014-04-17 DIAGNOSIS — S049XXS Injury of unspecified cranial nerve, sequela: Secondary | ICD-10-CM

## 2014-04-17 DIAGNOSIS — R51 Headache: Secondary | ICD-10-CM | POA: Diagnosis present

## 2014-04-17 DIAGNOSIS — D333 Benign neoplasm of cranial nerves: Secondary | ICD-10-CM

## 2014-04-17 DIAGNOSIS — Y838 Other surgical procedures as the cause of abnormal reaction of the patient, or of later complication, without mention of misadventure at the time of the procedure: Secondary | ICD-10-CM | POA: Insufficient documentation

## 2014-04-17 DIAGNOSIS — S0451XS Injury of facial nerve, right side, sequela: Secondary | ICD-10-CM

## 2014-04-17 DIAGNOSIS — Z5181 Encounter for therapeutic drug level monitoring: Secondary | ICD-10-CM

## 2014-04-17 DIAGNOSIS — Z79899 Other long term (current) drug therapy: Secondary | ICD-10-CM

## 2014-04-17 MED ORDER — HYDROCODONE-ACETAMINOPHEN 10-325 MG PO TABS: ORAL_TABLET | ORAL | Status: DC

## 2014-04-17 MED ORDER — HYDROCODONE-ACETAMINOPHEN 10-325 MG PO TABS
ORAL_TABLET | ORAL | Status: DC
Start: 2014-04-17 — End: 2014-06-17

## 2014-04-17 MED ORDER — TOPIRAMATE 100 MG PO TABS: 200.0000 mg | ORAL_TABLET | Freq: Every day | ORAL | Status: DC

## 2014-04-17 NOTE — Progress Notes (Signed)
Subjective:    Patient ID: [name], male    DOB: Aug 26, 1960, 53 y.o.   MRN: [id number]  HPI: [name] is a 53 year old male who returns for follow up for chronic pain and medication refill. He says he's having right facial pain. He rates his pain 6. Also complaining of Left great toe Pain. His current exercise regime is walking. He uses his cadillac walker.Wearing right eye patch. Wife in the room all questions answered.  Pain Inventory Average Pain 6 Pain Right Now 6 My pain is tingling  In the last 24 hours, has pain interfered with the following? General activity 2 Relation with others 2 Enjoyment of life 2 What TIME of day is your pain at its worst? daytime Sleep (in general) Fair  Pain is worse with: inactivity Pain improves with: rest and medication Relief from Meds: 10  Mobility walk with assistance use a walker ability to climb steps?  no do you drive?  yes  Function I need assistance with the following:  bathing  Neuro/Psych trouble walking  Prior Studies Any changes since last visit?  no  Physicians involved in your care Any changes since last visit?  no   Family History  Problem Relation Age of Onset  . CAD Father    History   Social History  . Marital Status: Married    Spouse Name: N/A    Number of Children: N/A  . Years of Education: N/A   Social History Main Topics  . Smoking status: Heavy Tobacco Smoker -- 0.50 packs/day for 20 years    Types: Cigarettes  . Smokeless tobacco: Current User     Comment: Rare tobacco.  "[name]"  . Alcohol Use: No  . Drug Use: No  . Sexual Activity: Yes   Other Topics Concern  . None   Social History Narrative  . None   Past Surgical History  Procedure Laterality Date  . Coronary artery bypass graft      LIMA to LAD 2009  . Umbilical hernia repair    . Anal fistulectomy    . Retrosigmoid craniectomy for tumor resection Right 10/11/2012    Procedure: RETROSIGMOID  CRANIECTOMY FOR TUMOR RESECTION;  Surgeon: [name], MD;  Location: [place] [facility];  Service: Neurosurgery;  Laterality: Right;  Craniotomy for acoustic neuroma  . Tracheostomy tube placement N/A 10/11/2012    Procedure: TRACHEOSTOMY;  Surgeon: Ascencion Dike, MD;  Location: [facility];  Service: ENT;  Laterality: N/A;  . Acoustic neuroma resection N/A 10/11/2012    Procedure: ACOUSTIC NEUROMA RESECTION;  Surgeon: Ascencion Dike, MD;  Location: [place] [facility];  Service: ENT;  Laterality: N/A;  . Craniotomy Right 11/20/2012    Procedure: CRANIOTOMY REPAIR DURAL/CENTRAL SPINAL FLUID LEAK;  Surgeon: [name], MD;  Location: [place] [facility];  Service: Neurosurgery;  Laterality: Right;  . Placement of lumbar drain N/A 11/20/2012    Procedure: Attempted PLACEMENT OF LUMBAR DRAIN;  Surgeon: [name], MD;  Location: [place] [facility];  Service: Neurosurgery;  Laterality: N/A;   Past Medical History  Diagnosis Date  . CAD (coronary artery disease)     Cath 09, 99% LAD  . Cardiomyopathy, ischemic     Ischemic  . Obesity   . Fibromyalgia   . Shortness of breath   . CHF (congestive heart failure)   . Hypertension     dr Percival Spanish  . Cyst near coccyx   .  Diabetes mellitus without complication     borderline  . Cyst near tailbone   . Kidney stones   . Myocardial infarction   . Headache(784.0)     with lights   BP 155/71  Pulse 74  Resp 14  Ht 5\' 11"  (1.803 m)  Wt 346 lb (156.945 kg)  BMI 48.28 kg/m2  SpO2 92%  Opioid Risk Score:   Fall Risk Score: Moderate Fall Risk (6-13 points) (pt educated handout declined)   Review of Systems  Gastrointestinal: Positive for constipation.  Musculoskeletal: Positive for gait problem.  All other systems reviewed and are negative.      Objective:   Physical Exam  Nursing note and vitals reviewed. Constitutional: He is oriented to person, place, and time. He appears well-developed and well-nourished.  HENT:  Head: Normocephalic and atraumatic.    Wearing Right Eye Patch Right Facial Droop  Neck: Normal range of motion. Neck supple.  Cardiovascular: Normal rate and regular rhythm.   Pulmonary/Chest: Effort normal and breath sounds normal.  Musculoskeletal:  Normal Muscle Bulk and Muscle Testing Reveals: Upper Extremities: Full ROM and Muscle Strength 5/5 Lower extremities: Full ROM and Muscle Strength 5/5 Left Leg Flexion Produces Pain into popliteal fossa   Neurological: He is alert and oriented to person, place, and time.  Skin: Skin is warm and dry.  Left Great Toe Reddened Denies Tenderness  Psychiatric: He has a normal mood and affect.          Assessment & Plan:  1. 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 Refilled: Hydrocodone 10/325mg  one tablet every 6 hours as needed #120. Second script given. Continue eye gtts, ointment and patch.  2. Headaches right temporal area related to tumor/surgery. No complaints voiced today. Continue Topamax  3. Lower extremity edema, morbid obesity. On Lasix 40 mg daily: PMD Following.  4. Gout?: Follow up with PCP.  20 minutes of face to face patient care time was spent during this visit. All questions were encouraged and answered.   F/U in 2 months

## 2014-06-17 ENCOUNTER — Encounter: Payer: BC Managed Care – PPO | Attending: Physical Medicine & Rehabilitation | Admitting: Registered Nurse

## 2014-06-17 ENCOUNTER — Encounter: Payer: Self-pay | Admitting: Registered Nurse

## 2014-06-17 VITALS — BP 155/79 | HR 62 | Resp 14 | Ht 71.0 in | Wt 349.0 lb

## 2014-06-17 DIAGNOSIS — Z79899 Other long term (current) drug therapy: Secondary | ICD-10-CM | POA: Diagnosis not present

## 2014-06-17 DIAGNOSIS — G894 Chronic pain syndrome: Secondary | ICD-10-CM | POA: Insufficient documentation

## 2014-06-17 DIAGNOSIS — Z5181 Encounter for therapeutic drug level monitoring: Secondary | ICD-10-CM | POA: Insufficient documentation

## 2014-06-17 MED ORDER — HYDROCODONE-ACETAMINOPHEN 10-325 MG PO TABS: ORAL_TABLET | ORAL | Status: DC

## 2014-06-17 NOTE — Progress Notes (Signed)
Subjective:    Patient ID: [name], male    DOB: [birth year], 53 y.o.   MRN: [id number]  HPI: Mr. CORBIN KRAMPITZ is a 53 year old male who returns for follow up for chronic pain and medication refill. He's complaining of  right facial pain and bilateral feet pain. He rates his pain 7. Also complaining of right Pain. His current exercise regime is walking. He's using his cadillac walker. He's wearing right eye patch. Wife in the room all questions answered.    Pain Inventory Average Pain 8 Pain Right Now 7 My pain is constant, sharp, tingling and aching  In the last 24 hours, has pain interfered with the following? General activity 6 Relation with others 1 Enjoyment of life 2 What TIME of day is your pain at its worst? evening, night  Sleep (in general) Fair  Pain is worse with: walking and some activites Pain improves with: rest and medication Relief from Meds: 10  Mobility walk without assistance use a walker ability to climb steps?  no do you drive?  no  Function not employed: date last employed no date provided I need assistance with the following:  dressing and bathing  Neuro/Psych bladder control problems numbness tremor trouble walking spasms dizziness  Prior Studies Any changes since last visit?  no  Physicians involved in your care Any changes since last visit?  no   Family History  Problem Relation Age of Onset  . CAD Father    History   Social History  . Marital Status: Married    Spouse Name: N/A    Number of Children: N/A  . Years of Education: N/A   Social History Main Topics  . Smoking status: Heavy Tobacco Smoker -- 0.50 packs/day for 20 years    Types: Cigarettes  . Smokeless tobacco: Current User     Comment: Rare tobacco.  "[name]"  . Alcohol Use: No  . Drug Use: No  . Sexual Activity: Yes   Other Topics Concern  . None   Social History Narrative  . None   Past Surgical History  Procedure  Laterality Date  . Coronary artery bypass graft      LIMA to LAD 2009  . Umbilical hernia repair    . Anal fistulectomy    . Retrosigmoid craniectomy for tumor resection Right 10/11/2012    Procedure: RETROSIGMOID CRANIECTOMY FOR TUMOR RESECTION;  Surgeon: [name], MD;  Location: [place] [facility];  Service: Neurosurgery;  Laterality: Right;  Craniotomy for acoustic neuroma  . Tracheostomy tube placement N/A 10/11/2012    Procedure: TRACHEOSTOMY;  Surgeon: Ascencion Dike, MD;  Location: [facility];  Service: ENT;  Laterality: N/A;  . Acoustic neuroma resection N/A 10/11/2012    Procedure: ACOUSTIC NEUROMA RESECTION;  Surgeon: Ascencion Dike, MD;  Location: [place] [facility];  Service: ENT;  Laterality: N/A;  . Craniotomy Right 11/20/2012    Procedure: CRANIOTOMY REPAIR DURAL/CENTRAL SPINAL FLUID LEAK;  Surgeon: [name], MD;  Location: [place] [facility];  Service: Neurosurgery;  Laterality: Right;  . Placement of lumbar drain N/A 11/20/2012    Procedure: Attempted PLACEMENT OF LUMBAR DRAIN;  Surgeon: [name], MD;  Location: [place] [facility];  Service: Neurosurgery;  Laterality: N/A;   Past Medical History  Diagnosis Date  . CAD (coronary artery disease)     Cath 09, 99% LAD  . Cardiomyopathy, ischemic     Ischemic  . Obesity   .  Fibromyalgia   . Shortness of breath   . CHF (congestive heart failure)   . Hypertension     dr Percival Spanish  . Cyst near coccyx   . Diabetes mellitus without complication     borderline  . Cyst near tailbone   . Kidney stones   . Myocardial infarction   . Headache(784.0)     with lights   BP 155/79  Pulse 62  Resp 14  Ht 5\' 11"  (1.803 m)  Wt 349 lb (158.305 kg)  BMI 48.70 kg/m2  SpO2 94%  Opioid Risk Score:   Fall Risk Score: Moderate Fall Risk (6-13 points) Review of Systems     Objective:   Physical Exam  Nursing note and vitals reviewed. Constitutional: He is oriented to person, place, and time. He appears well-developed and well-nourished.  HENT:    Head: Normocephalic and atraumatic.  Right facial droop/ tenderness with palpation  Neck: Normal range of motion. Neck supple.  Cardiovascular: Normal rate and regular rhythm.   Pulmonary/Chest: Effort normal and breath sounds normal.  Musculoskeletal:  Normal Muscle Bulk and Muscle testing reveals: Upper extremities: Full ROM and Muscle Strength 5/5 Lower Extremities: Full ROM and Muscle Strength 5/5 Right Foot reddened? PCP Following Arises from chair with ease Narrow Based gait  Neurological: He is alert and oriented to person, place, and time.  Skin: Skin is warm and dry.  Psychiatric: He has a normal mood and affect.          Assessment & Plan:  1. 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 Refilled: Hydrocodone 10/325mg  one tablet every 6 hours as needed #120. Second script given. Continue eye gtts, ointment and patch.  2. Headaches right temporal area related to tumor/surgery. No complaints voiced today. Continue Topamax  3. Lower extremity edema, morbid obesity. On Lasix 40 mg daily: PMD Following.  4. Gout?: Follow up with PCP.  20 minutes of face to face patient care time was spent during this visit. All questions were encouraged and answered.  F/U in 2 months

## 2014-06-18 ENCOUNTER — Other Ambulatory Visit: Payer: Self-pay | Admitting: Physical Medicine & Rehabilitation

## 2014-06-19 LAB — PMP ALCOHOL METABOLITE (ETG): Ethyl Glucuronide (EtG): NEGATIVE ng/mL

## 2014-06-21 LAB — OPIATES/OPIOIDS (LC/MS-MS)
CODEINE URINE: NEGATIVE ng/mL (ref <50)
Hydrocodone: 565 ng/mL (ref <50)
Hydromorphone: 909 ng/mL (ref <50)
MORPHINE: NEGATIVE ng/mL (ref <50)
Norhydrocodone, Ur: 1830 ng/mL (ref <50)
Noroxycodone, Ur: NEGATIVE ng/mL (ref <50)
OXYCODONE, UR: NEGATIVE ng/mL (ref <50)
OXYMORPHONE, URINE: NEGATIVE ng/mL (ref <50)

## 2014-06-23 LAB — PRESCRIPTION MONITORING PROFILE (SOLSTAS)
Amphetamine/Meth: NEGATIVE ng/mL
BARBITURATE SCREEN, URINE: NEGATIVE ng/mL
BENZODIAZEPINE SCREEN, URINE: NEGATIVE ng/mL
Buprenorphine, Urine: NEGATIVE ng/mL
COCAINE METABOLITES: NEGATIVE ng/mL
Cannabinoid Scrn, Ur: NEGATIVE ng/mL
Carisoprodol, Urine: NEGATIVE ng/mL
Creatinine, Urine: 120.49 mg/dL (ref >20.0)
Fentanyl, Ur: NEGATIVE ng/mL
MDMA URINE: NEGATIVE ng/mL
Meperidine, Ur: NEGATIVE ng/mL
Methadone Screen, Urine: NEGATIVE ng/mL
Nitrites, Initial: NEGATIVE ug/mL
Oxycodone Screen, Ur: NEGATIVE ng/mL
PH URINE, INITIAL: 7.6 pH (ref 4.5–8.9)
Propoxyphene: NEGATIVE ng/mL
TRAMADOL UR: NEGATIVE ng/mL
Tapentadol, urine: NEGATIVE ng/mL
ZOLPIDEM, URINE: NEGATIVE ng/mL

## 2014-08-18 ENCOUNTER — Encounter
Payer: BC Managed Care – PPO | Attending: Physical Medicine & Rehabilitation | Admitting: Physical Medicine & Rehabilitation

## 2014-08-18 ENCOUNTER — Encounter: Payer: Self-pay | Admitting: Physical Medicine & Rehabilitation

## 2014-08-18 VITALS — BP 149/74 | HR 61 | Resp 14

## 2014-08-18 DIAGNOSIS — S0451XS Injury of facial nerve, right side, sequela: Secondary | ICD-10-CM

## 2014-08-18 DIAGNOSIS — G894 Chronic pain syndrome: Secondary | ICD-10-CM | POA: Insufficient documentation

## 2014-08-18 DIAGNOSIS — Z79899 Other long term (current) drug therapy: Secondary | ICD-10-CM | POA: Diagnosis present

## 2014-08-18 DIAGNOSIS — Z5181 Encounter for therapeutic drug level monitoring: Secondary | ICD-10-CM | POA: Insufficient documentation

## 2014-08-18 DIAGNOSIS — D333 Benign neoplasm of cranial nerves: Secondary | ICD-10-CM | POA: Diagnosis not present

## 2014-08-18 MED ORDER — HYDROCODONE-ACETAMINOPHEN 10-325 MG PO TABS: ORAL_TABLET | ORAL | Status: DC

## 2014-08-18 NOTE — Progress Notes (Signed)
Subjective:    Patient ID: [name], male    DOB: [birth year], 53 y.o.   MRN: [id number]  HPI   Mr. Koller is back regarding his chronic pain and associated gait disorder. He has a disability hearing in January which is he anxiously awaiting.  He feels that he's been doing fairly well. His pain is under fair control with the hydrocodone and topamax. He uses the baclofen prn.    He continues with a patch over his right eye. He complains of a film over the right eye. He is diligent with his eye drops and patch.   Pain Inventory Average Pain 7 Pain Right Now 7 My pain is burning, tingling and aching  In the last 24 hours, has pain interfered with the following? General activity 5 Relation with others 0 Enjoyment of life 7 What TIME of day is your pain at its worst? daytime Sleep (in general) Fair  Pain is worse with: walking and standing Pain improves with: rest and medication Relief from Meds: 5  Mobility walk without assistance use a walker ability to climb steps?  no do you drive?  no  Function disabled: date disabled in process I need assistance with the following:  bathing, household duties and shopping  Neuro/Psych bowel control problems numbness tremor tingling trouble walking spasms  Prior Studies Any changes since last visit?  no  Physicians involved in your care Any changes since last visit?  no   Family History  Problem Relation Age of Onset  . CAD Father    History   Social History  . Marital Status: Married    Spouse Name: N/A    Number of Children: N/A  . Years of Education: N/A   Social History Main Topics  . Smoking status: Heavy Tobacco Smoker -- 0.50 packs/day for 20 years    Types: Cigarettes  . Smokeless tobacco: Current User     Comment: Rare tobacco.  "[name]"  . Alcohol Use: No  . Drug Use: No  . Sexual Activity: Yes   Other Topics Concern  . None   Social History Narrative   Past Surgical History   Procedure Laterality Date  . Coronary artery bypass graft      LIMA to LAD 2009  . Umbilical hernia repair    . Anal fistulectomy    . Retrosigmoid craniectomy for tumor resection Right 10/11/2012    Procedure: RETROSIGMOID CRANIECTOMY FOR TUMOR RESECTION;  Surgeon: [name], MD;  Location: [place] [facility];  Service: Neurosurgery;  Laterality: Right;  Craniotomy for acoustic neuroma  . Tracheostomy tube placement N/A 10/11/2012    Procedure: TRACHEOSTOMY;  Surgeon: Ascencion Dike, MD;  Location: [facility];  Service: ENT;  Laterality: N/A;  . Acoustic neuroma resection N/A 10/11/2012    Procedure: ACOUSTIC NEUROMA RESECTION;  Surgeon: Ascencion Dike, MD;  Location: [place] [facility];  Service: ENT;  Laterality: N/A;  . Craniotomy Right 11/20/2012    Procedure: CRANIOTOMY REPAIR DURAL/CENTRAL SPINAL FLUID LEAK;  Surgeon: [name], MD;  Location: [place] [facility];  Service: Neurosurgery;  Laterality: Right;  . Placement of lumbar drain N/A 11/20/2012    Procedure: Attempted PLACEMENT OF LUMBAR DRAIN;  Surgeon: [name], MD;  Location: [place] [facility];  Service: Neurosurgery;  Laterality: N/A;   Past Medical History  Diagnosis Date  . CAD (coronary artery disease)     Cath 09, 99% LAD  . Cardiomyopathy, ischemic  Ischemic  . Obesity   . Fibromyalgia   . Shortness of breath   . CHF (congestive heart failure)   . Hypertension     dr Percival Spanish  . Cyst near coccyx   . Diabetes mellitus without complication     borderline  . Cyst near tailbone   . Kidney stones   . Myocardial infarction   . Headache(784.0)     with lights   BP 149/74 mmHg  Pulse 61  Resp 14  SpO2 94%  Opioid Risk Score:   Fall Risk Score: Moderate Fall Risk (6-13 points) (pt previously educate 10/13/13 and given handout) Review of Systems  Cardiovascular: Positive for leg swelling.  Gastrointestinal: Positive for constipation.       Bowel Control Problems  Endocrine:       Low blood sugar  Musculoskeletal:        Spasms  Neurological: Positive for tremors, facial asymmetry and numbness.       Tingling/ right facial droop  All other systems reviewed and are negative.      Objective:   Physical Exam General: Alert and oriented x 3, No apparent distress. Wearing an eye patch on right.  HEENT: Head is normocephalic, atraumatic, PERRLA, EOMI, sclera and conjunctiva much improved with decreased irritation but irritation and film still present. He has patch on, but it's sitting way off eye. oral mucosa pink and moist, dentition intact, ext ear canals clear.  Neck: Supple without JVD or lymphadenopathy   Heart: Reg rate and rhythm. No murmurs rubs or gallops  Chest: CTA bilaterally without wheezes, rales, or rhonchi; no distress  Abdomen: Soft, non-tender, non-distended, bowel sounds positive.   Extremities: No clubbing, cyanosis, 2+ edema RLE, 1+ LLE with dry skin at soles of feet.  Pulses are 2+  Skin: Clean and intact without signs of breakdown. Healing surgical scar right scalp. Right foot normal color, warm. No obvious breakdown.  Neuro: Cognitively he's at baseline. He's a bit impulsive. He continues to have decreased lid closure, brow bending and facial weakness on the right (minimal improvement). Gaze appears generally conjugate. No hearing on the right. Strength is 4 to 5/5 all 4 extremities. Right limb ataxia still noted. His balance is improved as a whole and he walked fairly stable without his rolling walker today. He has atrophy over the right temple. Area is sensitive.  Musculoskeletal: Full ROM, No pain with AROM or PROM in the neck, trunk, or extremities. Posture appropriate  Psych: Pt's affect is appropriate. Pt is cooperative   Assessment & Plan:   1. 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  2. Headaches right temporal area related to tumor/surgery.  3. Lower extremity edema,  morbid obesity  4. Cellulitis Right foot --resolved    Plan:  1. Continue eye gtts, abx ointment , eye patch. Gold weight at some point. i expressed to him the importance of his eye patch, KEEPING AIR and DUST out of his eye. He seems to still think "it needs air" 2. Maintain topamax at 200mg  qhs--- 3 months rx.  3. Refilled hydrocodone for pain control #120. Gave second rx for next month. Narcotics 4. Baclofen 10mg  prn for spasms and when he feels pain/headaches coming on.  5. Follow up with me in 2 months. 15 minutes of face to face patient care time were spent during this visit. All questions were encouraged and answered. Driving is still not advised although I believe  he has taken his truck out locally. I recommend continued use of his walker.

## 2014-08-18 NOTE — Patient Instructions (Signed)
PLEASE CALL ME WITH ANY PROBLEMS OR QUESTIONS (#[phone]).      

## 2014-08-28 ENCOUNTER — Telehealth: Payer: Self-pay | Admitting: *Deleted

## 2014-08-28 MED ORDER — BACLOFEN 10 MG PO TABS: 10.0000 mg | ORAL_TABLET | Freq: Three times a day (TID) | ORAL | Status: DC | PRN

## 2014-08-28 NOTE — Telephone Encounter (Signed)
Called to get refill on baclofen.  Order sent to pharmacy electronically.

## 2014-09-21 ENCOUNTER — Telehealth: Payer: Self-pay | Admitting: *Deleted

## 2014-09-21 NOTE — Telephone Encounter (Signed)
[name] is saying he does not want any more baclofen  He "took one pill and went to sleep and woke up with a crick in his neck and it did the exact opposite and caused more problems and went to the back of his neck and froze it instead of helping the front of his neck. I ain't taknin no more of that stuff, it coulda killed me"

## 2014-09-21 NOTE — Telephone Encounter (Signed)
Pt has questions about a new rx (?) he rec'd, asking for a nurse call back

## 2014-10-15 ENCOUNTER — Encounter: Payer: Self-pay | Admitting: Registered Nurse

## 2014-10-15 ENCOUNTER — Encounter: Payer: BLUE CROSS/BLUE SHIELD | Attending: Physical Medicine & Rehabilitation | Admitting: Registered Nurse

## 2014-10-15 VITALS — BP 142/74 | HR 57 | Resp 14

## 2014-10-15 DIAGNOSIS — Z79899 Other long term (current) drug therapy: Secondary | ICD-10-CM | POA: Diagnosis present

## 2014-10-15 DIAGNOSIS — G894 Chronic pain syndrome: Secondary | ICD-10-CM | POA: Diagnosis not present

## 2014-10-15 DIAGNOSIS — D333 Benign neoplasm of cranial nerves: Secondary | ICD-10-CM | POA: Diagnosis not present

## 2014-10-15 DIAGNOSIS — S0451XS Injury of facial nerve, right side, sequela: Secondary | ICD-10-CM | POA: Diagnosis not present

## 2014-10-15 DIAGNOSIS — Z5181 Encounter for therapeutic drug level monitoring: Secondary | ICD-10-CM | POA: Insufficient documentation

## 2014-10-15 MED ORDER — HYDROCODONE-ACETAMINOPHEN 10-325 MG PO TABS: ORAL_TABLET | ORAL | Status: DC

## 2014-10-15 MED ORDER — TIZANIDINE HCL 4 MG PO TABS: 4.0000 mg | ORAL_TABLET | Freq: Two times a day (BID) | ORAL | Status: DC | PRN

## 2014-10-15 NOTE — Progress Notes (Signed)
Subjective:    Patient ID: [name], male    DOB: 1960-08-24, 54 y.o.   MRN: [id number]  HPI: [name] is a 54 year old male who returns for follow up for chronic pain and medication refill. He's complaining of right facial pain. He rates his pain 7. His current exercise regime is walking and performing stretching exercises. He's using his cadillac walker. He's wearing right eye patch. He states he had reaction from the baclofen, medication destroyed. He's wearing his right eye patch and states he keeps it lubricated.    Pain Inventory Average Pain 7 Pain Right Now 7 My pain is constant  In the last 24 hours, has pain interfered with the following? General activity 6 Relation with others 0 Enjoyment of life 6 What TIME of day is your pain at its worst? daytime Sleep (in general) Fair  Pain is worse with: walking and standing Pain improves with: rest and medication Relief from Meds: 7  Mobility walk with assistance use a walker how many minutes can you walk? 10 ability to climb steps?  no do you drive?  no Do you have any goals in this area?  no  Function disabled: date disabled . I need assistance with the following:  bathing  Neuro/Psych numbness spasms loss of taste or smell  Prior Studies Any changes since last visit?  no  Physicians involved in your care Any changes since last visit?  no   Family History  Problem Relation Age of Onset  . CAD Father    History   Social History  . Marital Status: Married    Spouse Name: N/A  . Number of Children: N/A  . Years of Education: N/A   Social History Main Topics  . Smoking status: Heavy Tobacco Smoker -- 0.50 packs/day for 20 years    Types: Cigarettes  . Smokeless tobacco: Current User     Comment: Rare tobacco.  "[name]"  . Alcohol Use: No  . Drug Use: No  . Sexual Activity: Yes   Other Topics Concern  . None   Social History Narrative   Past Surgical History    Procedure Laterality Date  . Coronary artery bypass graft      LIMA to LAD 2009  . Umbilical hernia repair    . Anal fistulectomy    . Retrosigmoid craniectomy for tumor resection Right 10/11/2012    Procedure: RETROSIGMOID CRANIECTOMY FOR TUMOR RESECTION;  Surgeon: [name], MD;  Location: [place] [facility];  Service: Neurosurgery;  Laterality: Right;  Craniotomy for acoustic neuroma  . Tracheostomy tube placement N/A 10/11/2012    Procedure: TRACHEOSTOMY;  Surgeon: Ascencion Dike, MD;  Location: [facility];  Service: ENT;  Laterality: N/A;  . Acoustic neuroma resection N/A 10/11/2012    Procedure: ACOUSTIC NEUROMA RESECTION;  Surgeon: Ascencion Dike, MD;  Location: [place] [facility];  Service: ENT;  Laterality: N/A;  . Craniotomy Right 11/20/2012    Procedure: CRANIOTOMY REPAIR DURAL/CENTRAL SPINAL FLUID LEAK;  Surgeon: [name], MD;  Location: [place] [facility];  Service: Neurosurgery;  Laterality: Right;  . Placement of lumbar drain N/A 11/20/2012    Procedure: Attempted PLACEMENT OF LUMBAR DRAIN;  Surgeon: [name], MD;  Location: [place] [facility];  Service: Neurosurgery;  Laterality: N/A;   Past Medical History  Diagnosis Date  . CAD (coronary artery disease)     Cath 09, 99% LAD  . Cardiomyopathy, ischemic  Ischemic  . Obesity   . Fibromyalgia   . Shortness of breath   . CHF (congestive heart failure)   . Hypertension     dr Percival Spanish  . Cyst near coccyx   . Diabetes mellitus without complication     borderline  . Cyst near tailbone   . Kidney stones   . Myocardial infarction   . Headache(784.0)     with lights   BP 142/74 mmHg  Pulse 57  Resp 14  SpO2 94%  Opioid Risk Score:   Fall Risk Score: Moderate Fall Risk (6-13 points)  Review of Systems  Constitutional:       Loss of taste   Gastrointestinal: Positive for constipation.  Skin: Positive for rash.  Neurological: Positive for numbness.       Spasms  All other systems reviewed and are negative.       Objective:   Physical Exam  Constitutional: He is oriented to person, place, and time. He appears well-developed and well-nourished.  HENT:  Head: Normocephalic and atraumatic.  Right Facial Droop  Neck: Normal range of motion. Neck supple.  Cervical Paraspinal Tenderness: C-6- C-7   Cardiovascular: Normal rate and regular rhythm.   Pulmonary/Chest: Effort normal and breath sounds normal.  Musculoskeletal: He exhibits edema.  Normal Muscle Bulk and Muscle Testing Reveals: Upper Extremities: Full ROM and Muscle Strength 5/5 Lower Extremities: Full ROM and Muscle Strength 5/5 Arises from chair with ease Narrow Based Gait   Neurological: He is alert and oriented to person, place, and time.  Skin: Skin is warm and dry.  Psychiatric: He has a normal mood and affect.  Nursing note and vitals reviewed.         Assessment & Plan:  1. 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 Refilled: Hydrocodone 10/325mg  one tablet every 6 hours as needed #120. Second script given. Continue eye gtts, ointment and patch.  2. Headaches right temporal area related to tumor/surgery. No complaints voiced today. Continue Topamax  3. Lower extremity edema, morbid obesity. On Lasix 40 mg daily: PMD Following.  4. Muscle Spasm: Baclofen discontinued and Tizanidine Prescribed.  20 minutes of face to face patient care time was spent during this visit. All questions were encouraged and answered.   F/U in 2 months

## 2014-12-10 ENCOUNTER — Encounter: Payer: Self-pay | Admitting: Registered Nurse

## 2014-12-10 ENCOUNTER — Encounter: Payer: BLUE CROSS/BLUE SHIELD | Attending: Physical Medicine & Rehabilitation | Admitting: Registered Nurse

## 2014-12-10 ENCOUNTER — Other Ambulatory Visit: Payer: Self-pay | Admitting: Registered Nurse

## 2014-12-10 VITALS — BP 149/66 | HR 68 | Resp 14

## 2014-12-10 DIAGNOSIS — G609 Hereditary and idiopathic neuropathy, unspecified: Secondary | ICD-10-CM

## 2014-12-10 DIAGNOSIS — G894 Chronic pain syndrome: Secondary | ICD-10-CM

## 2014-12-10 DIAGNOSIS — B369 Superficial mycosis, unspecified: Secondary | ICD-10-CM

## 2014-12-10 DIAGNOSIS — D333 Benign neoplasm of cranial nerves: Secondary | ICD-10-CM | POA: Diagnosis not present

## 2014-12-10 DIAGNOSIS — S0451XS Injury of facial nerve, right side, sequela: Secondary | ICD-10-CM

## 2014-12-10 DIAGNOSIS — Z5181 Encounter for therapeutic drug level monitoring: Secondary | ICD-10-CM | POA: Diagnosis present

## 2014-12-10 DIAGNOSIS — Z79899 Other long term (current) drug therapy: Secondary | ICD-10-CM

## 2014-12-10 MED ORDER — GABAPENTIN 100 MG PO CAPS: 100.0000 mg | ORAL_CAPSULE | Freq: Three times a day (TID) | ORAL | Status: DC

## 2014-12-10 MED ORDER — HYDROCODONE-ACETAMINOPHEN 10-325 MG PO TABS: ORAL_TABLET | ORAL | Status: DC

## 2014-12-10 NOTE — Patient Instructions (Signed)
Gabapentin : Start one capsule at bedtime for a week.  If Tingling persist Take Gabapentin Twice a day one in the Morning and one at Bedtime  If Tingling Persists Take Gabapentin Three times a day  If you have any questions Please Call the Office  [phone]

## 2014-12-10 NOTE — Progress Notes (Signed)
Subjective:    Patient ID: [name], male    DOB: November 18, 1960, 54 y.o.   MRN: [id number]  HPI: [name] is a 54 year old male who returns for follow up for chronic pain and medication refill. He's complaining of right facial pain. Also states increase frequency of tingling in left arm radiating to elbow and lower extremities. Will prescribed gabapentin. Instructions given and verbalizes understanding. He rates his pain 6. His current exercise regime is walking and performing stretching exercises. He's using his cadillac walker. He's wearing right eye patch and keeps it lubricated.   Pain Inventory Average Pain 6 Pain Right Now 6 My pain is constant, sharp, burning and tingling  In the last 24 hours, has pain interfered with the following? General activity 5 Relation with others 4 Enjoyment of life 4 What TIME of day is your pain at its worst? evening Sleep (in general) Fair  Pain is worse with: sitting Pain improves with: rest Relief from Meds: 5  Mobility walk with assistance use a walker how many minutes can you walk? 1-2 ability to climb steps?  no do you drive?  no  Function disabled: date disabled .  Neuro/Psych tremor tingling spasms depression anxiety  Prior Studies Any changes since last visit?  no  Physicians involved in your care Any changes since last visit?  no   Family History  Problem Relation Age of Onset  . CAD Father    History   Social History  . Marital Status: Married    Spouse Name: N/A  . Number of Children: N/A  . Years of Education: N/A   Social History Main Topics  . Smoking status: Light Tobacco Smoker -- 0.50 packs/day for 20 years    Types: Cigarettes  . Smokeless tobacco: Current User     Comment: Rare tobacco.  "[name]"  . Alcohol Use: No  . Drug Use: No  . Sexual Activity: Yes   Other Topics Concern  . None   Social History Narrative   Past Surgical History  Procedure Laterality  Date  . Coronary artery bypass graft      LIMA to LAD 2009  . Umbilical hernia repair    . Anal fistulectomy    . Retrosigmoid craniectomy for tumor resection Right 10/11/2012    Procedure: RETROSIGMOID CRANIECTOMY FOR TUMOR RESECTION;  Surgeon: [name], MD;  Location: [place] [facility];  Service: Neurosurgery;  Laterality: Right;  Craniotomy for acoustic neuroma  . Tracheostomy tube placement N/A 10/11/2012    Procedure: TRACHEOSTOMY;  Surgeon: Ascencion Dike, MD;  Location: [facility];  Service: ENT;  Laterality: N/A;  . Acoustic neuroma resection N/A 10/11/2012    Procedure: ACOUSTIC NEUROMA RESECTION;  Surgeon: Ascencion Dike, MD;  Location: [place] [facility];  Service: ENT;  Laterality: N/A;  . Craniotomy Right 11/20/2012    Procedure: CRANIOTOMY REPAIR DURAL/CENTRAL SPINAL FLUID LEAK;  Surgeon: [name], MD;  Location: [place] [facility];  Service: Neurosurgery;  Laterality: Right;  . Placement of lumbar drain N/A 11/20/2012    Procedure: Attempted PLACEMENT OF LUMBAR DRAIN;  Surgeon: [name], MD;  Location: [place] [facility];  Service: Neurosurgery;  Laterality: N/A;   Past Medical History  Diagnosis Date  . CAD (coronary artery disease)     Cath 09, 99% LAD  . Cardiomyopathy, ischemic     Ischemic  . Obesity   . Fibromyalgia   . Shortness of breath   .  CHF (congestive heart failure)   . Hypertension     dr Percival Spanish  . Cyst near coccyx   . Diabetes mellitus without complication     borderline  . Cyst near tailbone   . Kidney stones   . Myocardial infarction   . Headache(784.0)     with lights   BP 149/66 mmHg  Pulse 68  Resp 14  SpO2 93%  Opioid Risk Score:   Fall Risk Score:  `1  Depression screen PHQ 2/9  No flowsheet data found.    Review of Systems  Constitutional:       Skin breakdown, low blood sugar  HENT: Negative.   Eyes: Negative.   Respiratory: Positive for cough.   Cardiovascular: Positive for leg swelling.  Gastrointestinal: Positive for constipation.    Endocrine: Negative.   Genitourinary:       Bladder control problems  Musculoskeletal:       Neuropathy in left arm/hand  Skin: Negative.   Allergic/Immunologic: Negative.   Neurological: Positive for dizziness, tremors and numbness.       Tingling, spasms  Hematological: Negative.   Psychiatric/Behavioral: Negative.        Objective:   Physical Exam  Constitutional: He is oriented to person, place, and time. He appears well-developed and well-nourished.  HENT:  Head: Normocephalic and atraumatic.  Neck: Normal range of motion. Neck supple.  Cardiovascular: Normal rate and regular rhythm.   Pulmonary/Chest: Effort normal and breath sounds normal.  Musculoskeletal:  Normal Muscle Bulk and Muscle Testing Reveals: Upper Extremities: Full ROM and Muscle Strength 5/5 Lower Extremities: Full ROM and Muscle Strength 5/5 Arises from chair with ease Narrow Based gait   Neurological: He is alert and oriented to person, place, and time.  Skin: Skin is warm and dry.  Left Inframammary fold : Reddened and patchy  Nursing note and vitals reviewed.         Assessment & Plan:  1. 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 Refilled: Hydrocodone 10/325mg  one tablet every 6 hours as needed #120. Second script given. Continue eye gtts, ointment and patch.  2. Headaches right temporal area related to tumor/surgery. No complaints voiced today. Continue Topamax  3. Lower extremity edema, morbid obesity. On Lasix 40 mg daily: PMD Following.  4. Muscle Spasm: Baclofen discontinued and Tizanidine Prescribed. 5. Fungal Skin Infection: RX Nystatin Powder BID. F/U with PCP 6. Peripheral Neuropathy: RX: Gabapentin  20 minutes of face to face patient care time was spent during this visit. All questions were encouraged and answered.   F/U in 2 months

## 2014-12-11 LAB — PMP ALCOHOL METABOLITE (ETG): ETGU: NEGATIVE ng/mL

## 2014-12-15 LAB — PRESCRIPTION MONITORING PROFILE (SOLSTAS)
Amphetamine/Meth: NEGATIVE ng/mL
BENZODIAZEPINE SCREEN, URINE: NEGATIVE ng/mL
BUPRENORPHINE, URINE: NEGATIVE ng/mL
Barbiturate Screen, Urine: NEGATIVE ng/mL
Cannabinoid Scrn, Ur: NEGATIVE ng/mL
Carisoprodol, Urine: NEGATIVE ng/mL
Cocaine Metabolites: NEGATIVE ng/mL
Creatinine, Urine: 191.1 mg/dL (ref >20.0)
FENTANYL URINE: NEGATIVE ng/mL
MDMA URINE: NEGATIVE ng/mL
MEPERIDINE UR: NEGATIVE ng/mL
Methadone Screen, Urine: NEGATIVE ng/mL
Nitrites, Initial: NEGATIVE ug/mL
Oxycodone Screen, Ur: NEGATIVE ng/mL
PROPOXYPHENE: NEGATIVE ng/mL
TRAMADOL UR: NEGATIVE ng/mL
Tapentadol, urine: NEGATIVE ng/mL
Zolpidem, Urine: NEGATIVE ng/mL
pH, Initial: 5.3 pH (ref 4.5–8.9)

## 2014-12-15 LAB — OPIATES/OPIOIDS (LC/MS-MS)
Codeine Urine: NEGATIVE ng/mL (ref <50)
HYDROCODONE: 4555 ng/mL (ref <50)
Hydromorphone: 1677 ng/mL (ref <50)
MORPHINE: NEGATIVE ng/mL (ref <50)
Norhydrocodone, Ur: 4411 ng/mL (ref <50)
Noroxycodone, Ur: NEGATIVE ng/mL (ref <50)
Oxycodone, ur: NEGATIVE ng/mL (ref <50)
Oxymorphone: NEGATIVE ng/mL (ref <50)

## 2015-01-05 NOTE — Progress Notes (Signed)
Urine drug screen for this encounter is consistent for prescribed medication 

## 2015-02-15 ENCOUNTER — Encounter
Payer: BLUE CROSS/BLUE SHIELD | Attending: Physical Medicine & Rehabilitation | Admitting: Physical Medicine & Rehabilitation

## 2015-02-15 ENCOUNTER — Encounter: Payer: Self-pay | Admitting: Physical Medicine & Rehabilitation

## 2015-02-15 VITALS — BP 161/91 | HR 65 | Resp 16

## 2015-02-15 DIAGNOSIS — Z5181 Encounter for therapeutic drug level monitoring: Secondary | ICD-10-CM | POA: Insufficient documentation

## 2015-02-15 DIAGNOSIS — Z79899 Other long term (current) drug therapy: Secondary | ICD-10-CM | POA: Diagnosis present

## 2015-02-15 DIAGNOSIS — S0451XS Injury of facial nerve, right side, sequela: Secondary | ICD-10-CM | POA: Diagnosis not present

## 2015-02-15 DIAGNOSIS — D333 Benign neoplasm of cranial nerves: Secondary | ICD-10-CM | POA: Diagnosis not present

## 2015-02-15 DIAGNOSIS — G894 Chronic pain syndrome: Secondary | ICD-10-CM | POA: Diagnosis not present

## 2015-02-15 MED ORDER — TRAMADOL HCL 50 MG PO TABS: 50.0000 mg | ORAL_TABLET | Freq: Four times a day (QID) | ORAL | Status: DC | PRN

## 2015-02-15 MED ORDER — HYDROCODONE-ACETAMINOPHEN 10-325 MG PO TABS: ORAL_TABLET | ORAL | Status: DC

## 2015-02-15 MED ORDER — NYSTATIN 100000 UNIT/GM EX POWD: 1.0000 g | Freq: Two times a day (BID) | CUTANEOUS | Status: DC | PRN

## 2015-02-15 NOTE — Progress Notes (Signed)
Subjective:    Patient ID: [name], male    DOB: 10-Oct-1960, 54 y.o.   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 He is concerned that since his insurance changed that his hydrocodone is no longer covered starting next month. He is interested in hearing other options. He is allergic to morphine and didn't tolerate oxycodone very well when we tried it in the past.  He uses the topamax still even though it tastes bad because it helps his daily headaches.    His weight remains an issue. He exercises minimally. 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   Pain Inventory Average Pain 7 Pain Right Now 7 My pain is sharp, aching and tooth ache  In the last 24 hours, has pain interfered with the following? General activity 7 Relation with others 7 Enjoyment of life 7 What TIME of day is your pain at its worst? evening Sleep (in general) Fair  Pain is worse with: walking Pain improves with: rest Relief from Meds: 5  Mobility walk with assistance use a walker how many minutes can you walk? 10 ability to climb steps?  no do you drive?  no  Function disabled: date disabled 2014 I need assistance with the following:  bathing  Neuro/Psych numbness tingling trouble walking loss of taste or smell  Prior Studies Any changes since last visit?  no  Physicians involved in your care Any changes since last visit?  no   Family History  Problem Relation Age of Onset  . CAD Father    History   Social History  . Marital Status: Married    Spouse Name: N/A  . Number of Children: N/A  . Years of Education: N/A   Social History Main Topics  . Smoking status: Light Tobacco Smoker -- 0.50 packs/day for 20 years    Types: Cigarettes  . Smokeless tobacco: Current User     Comment: Rare tobacco.  "[name]"  . Alcohol Use: No  . Drug Use: No  .  Sexual Activity: Yes   Other Topics Concern  . None   Social History Narrative   Past Surgical History  Procedure Laterality Date  . Coronary artery bypass graft      LIMA to LAD 2009  . Umbilical hernia repair    . Anal fistulectomy    . Retrosigmoid craniectomy for tumor resection Right 10/11/2012    Procedure: RETROSIGMOID CRANIECTOMY FOR TUMOR RESECTION;  Surgeon: [name], MD;  Location: [place] [facility];  Service: Neurosurgery;  Laterality: Right;  Craniotomy for acoustic neuroma  . Tracheostomy tube placement N/A 10/11/2012    Procedure: TRACHEOSTOMY;  Surgeon: Ascencion Dike, MD;  Location: [facility];  Service: ENT;  Laterality: N/A;  . Acoustic neuroma resection N/A 10/11/2012    Procedure: ACOUSTIC NEUROMA RESECTION;  Surgeon: Ascencion Dike, MD;  Location: [place] [facility];  Service: ENT;  Laterality: N/A;  . Craniotomy Right 11/20/2012    Procedure: CRANIOTOMY REPAIR DURAL/CENTRAL SPINAL FLUID LEAK;  Surgeon: [name], MD;  Location: [place] [facility];  Service: Neurosurgery;  Laterality: Right;  . Placement of lumbar drain N/A 11/20/2012    Procedure: Attempted PLACEMENT OF LUMBAR DRAIN;  Surgeon: [name], MD;  Location: [place] [facility];  Service: Neurosurgery;  Laterality: N/A;   Past Medical History  Diagnosis Date  .  CAD (coronary artery disease)     Cath 09, 99% LAD  . Cardiomyopathy, ischemic     Ischemic  . Obesity   . Fibromyalgia   . Shortness of breath   . CHF (congestive heart failure)   . Hypertension     dr Percival Spanish  . Cyst near coccyx   . Diabetes mellitus without complication     borderline  . Cyst near tailbone   . Kidney stones   . Myocardial infarction   . Headache(784.0)     with lights   BP 161/91 mmHg  Pulse 65  Resp 16  SpO2 91%  Opioid Risk Score:   Fall Risk Score: Moderate Fall Risk (6-13 points) (previously educated and given handout)`1  Depression screen PHQ 2/9  Depression screen PHQ 2/9 02/15/2015  Decreased Interest 0  Down,  Depressed, Hopeless 0  PHQ - 2 Score 0  Altered sleeping 0  Tired, decreased energy 0  Change in appetite 0  Feeling bad or failure about yourself  0  Trouble concentrating 0  Moving slowly or fidgety/restless 0  Suicidal thoughts 0  PHQ-9 Score 0     Review of Systems  Constitutional:       Loss of smell  HENT: Positive for dental problem.   Respiratory: Positive for cough and wheezing.   Gastrointestinal: Positive for constipation.  Endocrine:       Low blood sugars  Musculoskeletal: Positive for gait problem.  Skin: Positive for rash.  Neurological: Positive for numbness.       Tingling  All other systems reviewed and are negative.      Objective:   Physical Exam  General: Alert and oriented x 3, No apparent distress. Wearing an eye patch on right.  HEENT: Head is normocephalic, atraumatic, PERRLA, EOMI, sclera and conjunctiva much improved with decreased irritation  present. He has patch on, but it's sitting way off eye. oral mucosa pink and moist, dentition intact, ext ear canals clear.  Neck: Supple without JVD or lymphadenopathy  Heart: Reg rate and rhythm. No murmurs rubs or gallops  Chest: CTA bilaterally without wheezes, rales, or rhonchi; no distress  Abdomen: Soft, non-tender, non-distended, bowel sounds positive.  Extremities: No clubbing, cyanosis, 2+ edema RLE, 1+ LLE with dry skin at soles of feet. Pulses are 2+  Skin: reddened areas under breasts  Neuro: Cognitively he's at baseline. He's a bit impulsive and distractible. He continues to have decreased lid closure, brow bending and facial weakness on the right (minimal improvement). Gaze appears generally conjugate. No hearing on the right. Strength is 4 to 5/5 all 4 extremities. Right limb ataxia still noted. His balance is improved as a whole and he walked fairly stable without his rolling walker today. He has atrophy over the right temple. Area is sensitive.  Musculoskeletal: Full ROM, No pain with AROM or  PROM in the neck, trunk, or extremities. Posture appropriate  Psych: Pt's affect is appropriate. Pt is cooperative  Assessment & Plan:   1. 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  2. Headaches right temporal area related to tumor/surgery.  3. Lower extremity edema, morbid obesity  4. Mild fungal rash in skin folds  Plan:  1. Discussed eye patches and options to cover eye today. 2. Maintain topamax at 200mg  qhs---this is working for him.  3. Refilled hydrocodone for pain control #120. Gave second rx for next month. He will check his drug formulary  to see what's covered moving ahead 4. Gave him an rx for nystatin powder to treat skin fold/moist areas  5. Follow up with me in 2 months. 15 minutes of face to face patient care time were spent during this visit. All questions were encouraged and answered. Driving is still not advised

## 2015-02-15 NOTE — Patient Instructions (Addendum)
PLEASE CALL ME WITH ANY PROBLEMS OR QUESTIONS [id number]).  HAVE A GOOD DAY!   LOOK IN YOUR HUMAN FORMULARY BOOK FOR WHAT IS COVERED AS FAR AS PAIN MEDICATION IS CONCERNED.

## 2015-03-17 ENCOUNTER — Other Ambulatory Visit: Payer: Self-pay | Admitting: *Deleted

## 2015-03-17 DIAGNOSIS — G510 Bell's palsy: Secondary | ICD-10-CM

## 2015-03-17 DIAGNOSIS — R519 Headache, unspecified: Secondary | ICD-10-CM

## 2015-03-17 DIAGNOSIS — G939 Disorder of brain, unspecified: Secondary | ICD-10-CM

## 2015-03-17 DIAGNOSIS — Z9889 Other specified postprocedural states: Secondary | ICD-10-CM

## 2015-03-17 DIAGNOSIS — D333 Benign neoplasm of cranial nerves: Secondary | ICD-10-CM

## 2015-03-17 DIAGNOSIS — R51 Headache: Secondary | ICD-10-CM

## 2015-03-17 MED ORDER — TOPIRAMATE 100 MG PO TABS: 200.0000 mg | ORAL_TABLET | Freq: Every day | ORAL | Status: DC

## 2015-03-17 MED ORDER — NYSTATIN 100000 UNIT/GM EX POWD: 1.0000 g | Freq: Two times a day (BID) | CUTANEOUS | Status: DC | PRN

## 2015-03-17 MED ORDER — TIZANIDINE HCL 4 MG PO TABS: 4.0000 mg | ORAL_TABLET | Freq: Two times a day (BID) | ORAL | Status: DC | PRN

## 2015-04-12 ENCOUNTER — Ambulatory Visit: Payer: BLUE CROSS/BLUE SHIELD | Admitting: Physical Medicine & Rehabilitation

## 2015-04-19 ENCOUNTER — Encounter: Payer: Medicare PPO | Attending: Physical Medicine & Rehabilitation | Admitting: Physical Medicine & Rehabilitation

## 2015-04-19 ENCOUNTER — Encounter: Payer: Self-pay | Admitting: Physical Medicine & Rehabilitation

## 2015-04-19 ENCOUNTER — Other Ambulatory Visit: Payer: Self-pay | Admitting: Physical Medicine & Rehabilitation

## 2015-04-19 VITALS — BP 174/96 | HR 65 | Resp 14

## 2015-04-19 DIAGNOSIS — Z5181 Encounter for therapeutic drug level monitoring: Secondary | ICD-10-CM | POA: Diagnosis present

## 2015-04-19 DIAGNOSIS — G894 Chronic pain syndrome: Secondary | ICD-10-CM | POA: Insufficient documentation

## 2015-04-19 DIAGNOSIS — B49 Unspecified mycosis: Secondary | ICD-10-CM

## 2015-04-19 DIAGNOSIS — Z79899 Other long term (current) drug therapy: Secondary | ICD-10-CM | POA: Diagnosis present

## 2015-04-19 MED ORDER — TIZANIDINE HCL 4 MG PO TABS
4.0000 mg | ORAL_TABLET | Freq: Two times a day (BID) | ORAL | Status: DC | PRN
Start: 2015-04-19 — End: 2015-06-14

## 2015-04-19 MED ORDER — HYDROCODONE-ACETAMINOPHEN 10-325 MG PO TABS: ORAL_TABLET | ORAL | Status: DC

## 2015-04-19 MED ORDER — NYSTATIN 100000 UNIT/GM EX POWD: 1.0000 g | Freq: Two times a day (BID) | CUTANEOUS | Status: DC | PRN

## 2015-04-19 NOTE — Patient Instructions (Signed)
ARTIFICIAL TEARS

## 2015-04-19 NOTE — Progress Notes (Signed)
Subjective:    Patient ID: [name], male    DOB: Jun 05, 1961, 54 y.o.   MRN: [id number]  HPI [name] is back regarding his acoustic neuroma and associated functional deficits. Things have been relatively stable from a pain and functional standpoint. He still has not been able to find the eye patch similar to that which he received in the hospital. He has tried goggles but feels they are too tight on his head.   He has remained on the hydrocodone which provides him good relief.   Pain Inventory Average Pain 7 Pain Right Now 7 My pain is sharp and tingling  In the last 24 hours, has pain interfered with the following? General activity 2 Relation with others 9 Enjoyment of life 8 What TIME of day is your pain at its worst? evening Sleep (in general) Fair  Pain is worse with: walking and standing Pain improves with: rest and medication Relief from Meds: 7  Mobility walk with assistance use a walker how many minutes can you walk? 15 ability to climb steps?  no do you drive?  no  Function disabled: date disabled . I need assistance with the following:  dressing, bathing, meal prep and shopping  Neuro/Psych numbness tremor tingling trouble walking spasms  Prior Studies Any changes since last visit?  no  Physicians involved in your care Any changes since last visit?  no   Family History  Problem Relation Age of Onset  . CAD Father    Social History   Social History  . Marital Status: Married    Spouse Name: N/A  . Number of Children: N/A  . Years of Education: N/A   Social History Main Topics  . Smoking status: Light Tobacco Smoker -- 0.50 packs/day for 20 years    Types: Cigarettes  . Smokeless tobacco: Current User     Comment: Rare tobacco.  "[name]"  . Alcohol Use: No  . Drug Use: No  . Sexual Activity: Yes   Other Topics Concern  . None   Social History Narrative   Past Surgical History  Procedure Laterality Date  .  Coronary artery bypass graft      LIMA to LAD 2009  . Umbilical hernia repair    . Anal fistulectomy    . Retrosigmoid craniectomy for tumor resection Right 10/11/2012    Procedure: RETROSIGMOID CRANIECTOMY FOR TUMOR RESECTION;  Surgeon: [name], MD;  Location: [place] [facility];  Service: Neurosurgery;  Laterality: Right;  Craniotomy for acoustic neuroma  . Tracheostomy tube placement N/A 10/11/2012    Procedure: TRACHEOSTOMY;  Surgeon: Ascencion Dike, MD;  Location: [facility];  Service: ENT;  Laterality: N/A;  . Acoustic neuroma resection N/A 10/11/2012    Procedure: ACOUSTIC NEUROMA RESECTION;  Surgeon: Ascencion Dike, MD;  Location: [place] [facility];  Service: ENT;  Laterality: N/A;  . Craniotomy Right 11/20/2012    Procedure: CRANIOTOMY REPAIR DURAL/CENTRAL SPINAL FLUID LEAK;  Surgeon: [name], MD;  Location: [place] [facility];  Service: Neurosurgery;  Laterality: Right;  . Placement of lumbar drain N/A 11/20/2012    Procedure: Attempted PLACEMENT OF LUMBAR DRAIN;  Surgeon: [name], MD;  Location: [place] [facility];  Service: Neurosurgery;  Laterality: N/A;   Past Medical History  Diagnosis Date  . CAD (coronary artery disease)     Cath 09, 99% LAD  . Cardiomyopathy, ischemic     Ischemic  . Obesity   . Fibromyalgia   .  Shortness of breath   . CHF (congestive heart failure)   . Hypertension     dr Percival Spanish  . Cyst near coccyx   . Diabetes mellitus without complication     borderline  . Cyst near tailbone   . Kidney stones   . Myocardial infarction   . Headache(784.0)     with lights   BP 174/96 mmHg  Pulse 65  Resp 14  SpO2 92%  Opioid Risk Score:   Fall Risk Score:  `1  Depression screen PHQ 2/9  Depression screen PHQ 2/9 02/15/2015  Decreased Interest 0  Down, Depressed, Hopeless 0  PHQ - 2 Score 0  Altered sleeping 0  Tired, decreased energy 0  Change in appetite 0  Feeling bad or failure about yourself  0  Trouble concentrating 0  Moving slowly or fidgety/restless  0  Suicidal thoughts 0  PHQ-9 Score 0     Review of Systems  Gastrointestinal: Positive for constipation.  Endocrine:       Low blood sugar  Musculoskeletal: Positive for gait problem.  Neurological: Positive for tremors and numbness.       Tingling Spasms  Hematological: Bruises/bleeds easily.  All other systems reviewed and are negative.      Objective:   Physical Exam  General: Alert and oriented x 3, No apparent distress. Wearing an eye patch on right.  HEENT:   sclera and conjunctiva much improved with decreased irritation present. He has patch on, but it's sitting way off eye. oral mucosa pink and moist, dentition intact, ext ear canals clear.  Neck: Supple without JVD or lymphadenopathy  Heart: Reg rate and rhythm. No murmurs rubs or gallops  Chest: CTA bilaterally without wheezes, rales, or rhonchi; no distress  Abdomen: Soft, non-tender, non-distended, bowel sounds positive.  Extremities: No clubbing, cyanosis, 2+ edema RLE, 1+ LLE with dry skin at soles of feet. Pulses are 2+  Skin: reddened areas under breasts  Neuro: Cognitively he's at baseline. He's a bit impulsive and distractible. He continues to have decreased lid closure, brow bending and facial weakness on the right (minimal improvement). Gaze appears generally conjugate. No hearing on the right. Strength is 4 to 5/5 all 4 extremities. Right limb ataxia still noted. His balance is improved as a whole. He does well with his walker. He has atrophy over the right temple. Area is sensitive.  Musculoskeletal: Full ROM, No pain with AROM or PROM in the neck, trunk, or extremities. Posture appropriate  Psych: Pt's affect is appropriate. Pt is cooperative and pleasant   Assessment & Plan:   1. 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  2. Headaches right temporal area related to tumor/surgery.  3. Lower  extremity edema, morbid obesity  4. Mild fungal rash in skin folds   Plan:  1. Will check with hospital about eye patch manufacturer to see if we can find a supply of his desired patches.   2. Maintain topamax at 200mg  qhs. Refilled tizanidine 90 day rx 3. Refilled hydrocodone for pain control #120. Gave second rx for next month. He will check his drug formulary to see what's covered moving ahead  4. 90 day rx for nystatin powder to treat skin fold/moist areas was refilled. 5. Follow up with me or NP in 2 months. 15 minutes of face to face patient care time were spent during this visit. All questions were encouraged and answered. Driving is still not  advised

## 2015-04-20 LAB — PMP ALCOHOL METABOLITE (ETG): ETGU: NEGATIVE ng/mL

## 2015-04-23 LAB — OPIATES/OPIOIDS (LC/MS-MS)
CODEINE URINE: NEGATIVE ng/mL (ref <50)
HYDROCODONE: 4400 ng/mL (ref <50)
HYDROMORPHONE: 1660 ng/mL (ref <50)
MORPHINE: NEGATIVE ng/mL (ref <50)
Norhydrocodone, Ur: 4718 ng/mL (ref <50)
Noroxycodone, Ur: NEGATIVE ng/mL (ref <50)
OXYCODONE, UR: NEGATIVE ng/mL (ref <50)
Oxymorphone: NEGATIVE ng/mL (ref <50)

## 2015-04-24 LAB — PRESCRIPTION MONITORING PROFILE (SOLSTAS)
Amphetamine/Meth: NEGATIVE ng/mL
BARBITURATE SCREEN, URINE: NEGATIVE ng/mL
Benzodiazepine Screen, Urine: NEGATIVE ng/mL
Buprenorphine, Urine: NEGATIVE ng/mL
CANNABINOID SCRN UR: NEGATIVE ng/mL
CARISOPRODOL, URINE: NEGATIVE ng/mL
COCAINE METABOLITES: NEGATIVE ng/mL
CREATININE, URINE: 160.65 mg/dL (ref >20.0)
ECSTASY: NEGATIVE ng/mL
FENTANYL URINE: NEGATIVE ng/mL
MEPERIDINE UR: NEGATIVE ng/mL
METHADONE SCREEN, URINE: NEGATIVE ng/mL
Nitrites, Initial: NEGATIVE ug/mL
Oxycodone Screen, Ur: NEGATIVE ng/mL
PH URINE, INITIAL: 5.6 pH (ref 4.5–8.9)
Propoxyphene: NEGATIVE ng/mL
Tapentadol, urine: NEGATIVE ng/mL
Tramadol Scrn, Ur: NEGATIVE ng/mL
ZOLPIDEM, URINE: NEGATIVE ng/mL

## 2015-04-27 ENCOUNTER — Telehealth: Payer: Self-pay | Admitting: *Deleted

## 2015-04-27 NOTE — Telephone Encounter (Signed)
[name] has called about his eye patches.  Your note mentions you were going to check with hospital for manufacturer

## 2015-04-27 NOTE — Telephone Encounter (Signed)
Please discuss with jason. i gave him the patches/info today.  Thank you

## 2015-05-06 NOTE — Progress Notes (Signed)
Urine drug screen for this encounter is consistent for prescribed medication 

## 2015-06-14 ENCOUNTER — Encounter: Payer: Medicare PPO | Attending: Physical Medicine & Rehabilitation | Admitting: Registered Nurse

## 2015-06-14 ENCOUNTER — Encounter: Payer: Self-pay | Admitting: Registered Nurse

## 2015-06-14 VITALS — BP 141/66 | HR 72 | Resp 16

## 2015-06-14 DIAGNOSIS — D333 Benign neoplasm of cranial nerves: Secondary | ICD-10-CM

## 2015-06-14 DIAGNOSIS — Z5181 Encounter for therapeutic drug level monitoring: Secondary | ICD-10-CM

## 2015-06-14 DIAGNOSIS — Z79899 Other long term (current) drug therapy: Secondary | ICD-10-CM | POA: Insufficient documentation

## 2015-06-14 DIAGNOSIS — S0451XS Injury of facial nerve, right side, sequela: Secondary | ICD-10-CM | POA: Diagnosis not present

## 2015-06-14 DIAGNOSIS — G894 Chronic pain syndrome: Secondary | ICD-10-CM | POA: Diagnosis not present

## 2015-06-14 MED ORDER — HYDROCODONE-ACETAMINOPHEN 10-325 MG PO TABS: ORAL_TABLET | ORAL | Status: DC

## 2015-06-14 MED ORDER — TIZANIDINE HCL 4 MG PO TABS: 4.0000 mg | ORAL_TABLET | Freq: Three times a day (TID) | ORAL | Status: DC | PRN

## 2015-06-14 NOTE — Progress Notes (Signed)
Subjective:    Patient ID: [name], male    DOB: [birth year], 54 y.o.   MRN: [id number]  HPI: [name] is a 54 year old male who returns for follow up for chronic pain and medication refill. He's complaining of right temporal and right facial pain.  He rates his pain 7. His current exercise regime is walking and performing stretching exercises. He's using his cadillac walker. He's wearing right eye patch and keeps it lubricated.   Pain Inventory Average Pain 7 Pain Right Now 7 My pain is burning and tingling  In the last 24 hours, has pain interfered with the following? General activity 2 Relation with others 4 Enjoyment of life 4 What TIME of day is your pain at its worst? Evening and Night Sleep (in general) Fair  Pain is worse with: walking, standing and some activites Pain improves with: NA Relief from Meds: 5  Mobility walk with assistance use a walker how many minutes can you walk? 10 ability to climb steps?  no do you drive?  no needs help with transfers  Function retired I need assistance with the following:  dressing and bathing  Neuro/Psych numbness tremor tingling trouble walking spasms dizziness  Prior Studies Any changes since last visit?  no  Physicians involved in your care Any changes since last visit?  no   Family History  Problem Relation Age of Onset  . CAD Father    Social History   Social History  . Marital Status: Married    Spouse Name: N/A  . Number of Children: N/A  . Years of Education: N/A   Social History Main Topics  . Smoking status: Light Tobacco Smoker -- 0.50 packs/day for 20 years    Types: Cigarettes  . Smokeless tobacco: Current User     Comment: Rare tobacco.  "[name]"  . Alcohol Use: No  . Drug Use: No  . Sexual Activity: Yes   Other Topics Concern  . None   Social History Narrative   Past Surgical History  Procedure Laterality Date  . Coronary artery bypass graft        LIMA to LAD 2009  . Umbilical hernia repair    . Anal fistulectomy    . Retrosigmoid craniectomy for tumor resection Right 10/11/2012    Procedure: RETROSIGMOID CRANIECTOMY FOR TUMOR RESECTION;  Surgeon: [name], MD;  Location: [place] [facility];  Service: Neurosurgery;  Laterality: Right;  Craniotomy for acoustic neuroma  . Tracheostomy tube placement N/A 10/11/2012    Procedure: TRACHEOSTOMY;  Surgeon: Ascencion Dike, MD;  Location: [facility];  Service: ENT;  Laterality: N/A;  . Acoustic neuroma resection N/A 10/11/2012    Procedure: ACOUSTIC NEUROMA RESECTION;  Surgeon: Ascencion Dike, MD;  Location: [place] [facility];  Service: ENT;  Laterality: N/A;  . Craniotomy Right 11/20/2012    Procedure: CRANIOTOMY REPAIR DURAL/CENTRAL SPINAL FLUID LEAK;  Surgeon: [name], MD;  Location: [place] [facility];  Service: Neurosurgery;  Laterality: Right;  . Placement of lumbar drain N/A 11/20/2012    Procedure: Attempted PLACEMENT OF LUMBAR DRAIN;  Surgeon: [name], MD;  Location: [place] [facility];  Service: Neurosurgery;  Laterality: N/A;   Past Medical History  Diagnosis Date  . CAD (coronary artery disease)     Cath 09, 99% LAD  . Cardiomyopathy, ischemic     Ischemic  . Obesity   . Fibromyalgia   . Shortness of breath   .  CHF (congestive heart failure) ([place])   . Hypertension     dr Percival Spanish  . Cyst near coccyx   . Diabetes mellitus without complication (HCC)     borderline  . Cyst near tailbone   . Kidney stones   . Myocardial infarction ([place])   . Headache(784.0)     with lights   BP 141/66 mmHg  Pulse 72  Resp 16  SpO2 97%  Opioid Risk Score:   Fall Risk Score:  `1  Depression screen PHQ 2/9  Depression screen PHQ 2/9 02/15/2015  Decreased Interest 0  Down, Depressed, Hopeless 0  PHQ - 2 Score 0  Altered sleeping 0  Tired, decreased energy 0  Change in appetite 0  Feeling bad or failure about yourself  0  Trouble concentrating 0  Moving slowly or fidgety/restless 0  Suicidal  thoughts 0  PHQ-9 Score 0     Review of Systems  Respiratory: Positive for cough.   Gastrointestinal: Positive for constipation.  Endocrine:       Low Blood Sugar  Musculoskeletal:       Spasms  Neurological: Positive for dizziness, tremors and numbness.       Tingling Gait Instability  All other systems reviewed and are negative.      Objective:   Physical Exam  Constitutional: He is oriented to person, place, and time. He appears well-developed and well-nourished.  HENT:  Head: Normocephalic and atraumatic.  Neck: Normal range of motion. Neck supple.  Cardiovascular: Normal rate and regular rhythm.   Pulmonary/Chest: Effort normal and breath sounds normal.  Musculoskeletal:  Normal Muscle Bulk and Muscle Testing Reveals: Upper Extremities: Full ROM and Muscle Strength 5/5 Lower Extremities: Full ROM and Muscle Strength 5/5 Arises from chair slowly using cadillac walker for support Narrow Based gait  Neurological: He is alert and oriented to person, place, and time.  Skin: Skin is warm and dry.  Psychiatric: He has a normal mood and affect.  Nursing note and vitals reviewed.         Assessment & Plan:  1. 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 Refilled: Hydrocodone 10/325mg  one tablet every 6 hours as needed #120. Second script given. Continue eye gtts, ointment and patch.  2. Headaches right temporal area related to tumor/surgery. No complaints voiced today. Continue Topamax  3. Lower extremity edema, morbid obesity. On Lasix 40 mg daily: PMD Following.  4. Muscle Spasm: Continue Tizanidine. 5. Peripheral Neuropathy: Continue Gabapentin  20 minutes of face to face patient care time was spent during this visit. All questions were encouraged and answered.   F/U in 2 months

## 2015-08-17 ENCOUNTER — Encounter: Payer: Medicare PPO | Attending: Physical Medicine & Rehabilitation | Admitting: Registered Nurse

## 2015-08-17 ENCOUNTER — Encounter: Payer: Self-pay | Admitting: Registered Nurse

## 2015-08-17 VITALS — BP 144/63 | HR 64

## 2015-08-17 DIAGNOSIS — Z79899 Other long term (current) drug therapy: Secondary | ICD-10-CM | POA: Diagnosis present

## 2015-08-17 DIAGNOSIS — G510 Bell's palsy: Secondary | ICD-10-CM

## 2015-08-17 DIAGNOSIS — Z5181 Encounter for therapeutic drug level monitoring: Secondary | ICD-10-CM | POA: Diagnosis present

## 2015-08-17 DIAGNOSIS — G894 Chronic pain syndrome: Secondary | ICD-10-CM | POA: Diagnosis not present

## 2015-08-17 DIAGNOSIS — R51 Headache: Secondary | ICD-10-CM

## 2015-08-17 DIAGNOSIS — S0451XS Injury of facial nerve, right side, sequela: Secondary | ICD-10-CM

## 2015-08-17 DIAGNOSIS — D333 Benign neoplasm of cranial nerves: Secondary | ICD-10-CM | POA: Diagnosis not present

## 2015-08-17 DIAGNOSIS — Z9889 Other specified postprocedural states: Secondary | ICD-10-CM

## 2015-08-17 DIAGNOSIS — R519 Headache, unspecified: Secondary | ICD-10-CM

## 2015-08-17 DIAGNOSIS — G939 Disorder of brain, unspecified: Secondary | ICD-10-CM

## 2015-08-17 MED ORDER — HYDROCODONE-ACETAMINOPHEN 10-325 MG PO TABS: ORAL_TABLET | ORAL | Status: DC

## 2015-08-17 MED ORDER — TOPIRAMATE 100 MG PO TABS: 200.0000 mg | ORAL_TABLET | Freq: Every day | ORAL | Status: DC

## 2015-08-17 NOTE — Progress Notes (Signed)
Subjective:    Patient ID: [name], male    DOB: [birth year], 54 y.o.   MRN: [id number]  HPI: [name] is a 54 year old male who returns for follow up for chronic pain and medication refill. He's complaining of right temporal and right facial pain. He rates his pain 7. His current exercise regime is walking and performing stretching exercises. He's using his cadillac walker. He's wearing right eye patch and keeps it lubricated.  Arrived to office hypoxic 88% saturated no respiratory distress noted,admits to smoking prior to appointment. Education on smoking cessation he verbalizes understanding, oxygen saturation re-checked 96%.  Wife in room.  Pain Inventory Average Pain 7 Pain Right Now 7 My pain is sharp, burning and tingling  In the last 24 hours, has pain interfered with the following? General activity 7 Relation with others 7 Enjoyment of life 4 What TIME of day is your pain at its worst? evening Sleep (in general) Poor  Pain is worse with: walking and standing Pain improves with: medication Relief from Meds: 6  Mobility walk without assistance use a walker how many minutes can you walk? 5 ability to climb steps?  no do you drive?  no  Function retired I need assistance with the following:  dressing and bathing  Neuro/Psych bladder control problems  Prior Studies Any changes since last visit?  no  Physicians involved in your care Any changes since last visit?  no   Family History  Problem Relation Age of Onset  . CAD Father    Social History   Social History  . Marital Status: Married    Spouse Name: N/A  . Number of Children: N/A  . Years of Education: N/A   Social History Main Topics  . Smoking status: Light Tobacco Smoker -- 0.50 packs/day for 20 years    Types: Cigarettes  . Smokeless tobacco: Current User     Comment: Rare tobacco.  "[name]"  . Alcohol Use: No  . Drug Use: No  . Sexual Activity: Yes    Other Topics Concern  . None   Social History Narrative   Past Surgical History  Procedure Laterality Date  . Coronary artery bypass graft      LIMA to LAD 2009  . Umbilical hernia repair    . Anal fistulectomy    . Retrosigmoid craniectomy for tumor resection Right 10/11/2012    Procedure: RETROSIGMOID CRANIECTOMY FOR TUMOR RESECTION;  Surgeon: [name], MD;  Location: [facility];  Service: Neurosurgery;  Laterality: Right;  Craniotomy for acoustic neuroma  . Tracheostomy tube placement N/A 10/11/2012    Procedure: TRACHEOSTOMY;  Surgeon: Ascencion Dike, MD;  Location: [facility];  Service: ENT;  Laterality: N/A;  . Acoustic neuroma resection N/A 10/11/2012    Procedure: ACOUSTIC NEUROMA RESECTION;  Surgeon: Ascencion Dike, MD;  Location: [facility];  Service: ENT;  Laterality: N/A;  . Craniotomy Right 11/20/2012    Procedure: CRANIOTOMY REPAIR DURAL/CENTRAL SPINAL FLUID LEAK;  Surgeon: [name], MD;  Location: [place] [facility];  Service: Neurosurgery;  Laterality: Right;  . Placement of lumbar drain N/A 11/20/2012    Procedure: Attempted PLACEMENT OF LUMBAR DRAIN;  Surgeon: [name], MD;  Location: [place] [facility];  Service: Neurosurgery;  Laterality: N/A;   Past Medical History  Diagnosis Date  . CAD (coronary artery disease)     Cath 09, 99% LAD  . Cardiomyopathy, ischemic  Ischemic  . Obesity   . Fibromyalgia   . Shortness of breath   . CHF (congestive heart failure) ([place])   . Hypertension     dr Percival Spanish  . Cyst near coccyx   . Diabetes mellitus without complication (HCC)     borderline  . Cyst near tailbone   . Kidney stones   . Myocardial infarction ([place])   . Headache(784.0)     with lights   BP 144/63 mmHg  Pulse 64  SpO2 88%  Opioid Risk Score:   Fall Risk Score:  `1  Depression screen PHQ 2/9  Depression screen PHQ 2/9 02/15/2015  Decreased Interest 0  Down, Depressed, Hopeless 0  PHQ - 2 Score 0  Altered sleeping 0  Tired, decreased energy  0  Change in appetite 0  Feeling bad or failure about yourself  0  Trouble concentrating 0  Moving slowly or fidgety/restless 0  Suicidal thoughts 0  PHQ-9 Score 0     Review of Systems  Constitutional: Positive for chills.  Gastrointestinal: Positive for constipation.  Endocrine:       Low blood sugars  All other systems reviewed and are negative.      Objective:   Physical Exam  Constitutional: He is oriented to person, place, and time. He appears well-developed and well-nourished.  HENT:  Head: Normocephalic and atraumatic.  Neck: Normal range of motion. Neck supple.  Cardiovascular: Normal rate and regular rhythm.   Pulmonary/Chest: Effort normal and breath sounds normal.  Musculoskeletal:  Normal Muscle Bulk and Muscle Testing Reveals: Upper Extremities: Full ROM and Muscle Strength 5/5 Lower Extremities: Full ROM and Muscle Strength 5/5 Arises from chair slowly using walker for support Narrow Based Gait  Neurological: He is alert and oriented to person, place, and time.  Skin: Skin is warm and dry.  Psychiatric: He has a normal mood and affect.  Nursing note and vitals reviewed.         Assessment & Plan:  1. 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 Refilled: Hydrocodone 10/325mg  one tablet every 6 hours as needed #120. Second script given. Continue eye gtts, ointment and patch.  2. Headaches right temporal area related to tumor/surgery. No complaints voiced today. Continue Topamax  3. Lower extremity edema, morbid obesity. On Lasix 40 mg daily: PMD Following.  4. Muscle Spasm: Continue Tizanidine. 5. Peripheral Neuropathy: Continue Gabapentin  20 minutes of face to face patient care time was spent during this visit. All questions were encouraged and answered.   F/U in 2 months

## 2015-10-18 ENCOUNTER — Encounter: Payer: Self-pay | Admitting: Physical Medicine & Rehabilitation

## 2015-10-18 ENCOUNTER — Encounter: Payer: Medicare PPO | Attending: Physical Medicine & Rehabilitation | Admitting: Physical Medicine & Rehabilitation

## 2015-10-18 VITALS — BP 146/80 | HR 63 | Resp 14

## 2015-10-18 DIAGNOSIS — G939 Disorder of brain, unspecified: Secondary | ICD-10-CM

## 2015-10-18 DIAGNOSIS — S0451XS Injury of facial nerve, right side, sequela: Secondary | ICD-10-CM

## 2015-10-18 DIAGNOSIS — D333 Benign neoplasm of cranial nerves: Secondary | ICD-10-CM

## 2015-10-18 DIAGNOSIS — H109 Unspecified conjunctivitis: Secondary | ICD-10-CM | POA: Insufficient documentation

## 2015-10-18 DIAGNOSIS — Z79899 Other long term (current) drug therapy: Secondary | ICD-10-CM | POA: Insufficient documentation

## 2015-10-18 DIAGNOSIS — G510 Bell's palsy: Secondary | ICD-10-CM

## 2015-10-18 DIAGNOSIS — G894 Chronic pain syndrome: Secondary | ICD-10-CM | POA: Insufficient documentation

## 2015-10-18 DIAGNOSIS — Z9889 Other specified postprocedural states: Secondary | ICD-10-CM

## 2015-10-18 DIAGNOSIS — Z5181 Encounter for therapeutic drug level monitoring: Secondary | ICD-10-CM | POA: Insufficient documentation

## 2015-10-18 DIAGNOSIS — R51 Headache: Secondary | ICD-10-CM | POA: Diagnosis not present

## 2015-10-18 DIAGNOSIS — R519 Headache, unspecified: Secondary | ICD-10-CM

## 2015-10-18 DIAGNOSIS — H5789 Other specified disorders of eye and adnexa: Secondary | ICD-10-CM

## 2015-10-18 DIAGNOSIS — H578 Other specified disorders of eye and adnexa: Secondary | ICD-10-CM

## 2015-10-18 MED ORDER — TOPIRAMATE 100 MG PO TABS: 200.0000 mg | ORAL_TABLET | Freq: Every day | ORAL | Status: DC

## 2015-10-18 MED ORDER — HYDROCODONE-ACETAMINOPHEN 10-325 MG PO TABS: ORAL_TABLET | ORAL | Status: DC

## 2015-10-18 NOTE — Progress Notes (Signed)
Subjective:    Patient ID: [name], male    DOB: 07/22/1961, 55 y.o.   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 He states that he continues to have a chronic "film" over the right eye. He is wearing the patches with gauze underneath but never has worn anything truly occlusive, even at night. He has used eye gtts at times as well but nothing continuously.   He continues to have facial pain and hypersensitivity on the right. He is using hydrocodone for pain control. The topamax really helps his headaches. Tizanidine helps with spasms.  He recently had teeth extracted (2 weeks ago) and is now complaining of jaw pain. He asked me if I could prescribe him penicillin.   Pain Inventory Average Pain 0 Pain Right Now 0 My pain is sharp, stabbing and tingling  In the last 24 hours, has pain interfered with the following? General activity 7 Relation with others 4 Enjoyment of life 6 What TIME of day is your pain at its worst? daytime Sleep (in general) Fair  Pain is worse with: bending Pain improves with: rest Relief from Meds: good  Mobility walk without assistance walk with assistance use a walker ability to climb steps?  no do you drive?  no  Function I need assistance with the following:  dressing and bathing  Neuro/Psych No problems in this area  Prior Studies Any changes since last visit?  no  Physicians involved in your care Any changes since last visit?  no   Family History  Problem Relation Age of Onset  . CAD Father    Social History   Social History  . Marital Status: Married    Spouse Name: N/A  . Number of Children: N/A  . Years of Education: N/A   Social History Main Topics  . Smoking status: Light Tobacco Smoker -- 0.50 packs/day for 20 years    Types: Cigarettes  . Smokeless tobacco: Current User     Comment: Rare tobacco.  "[name]"  . Alcohol Use: No  . Drug Use: No   . Sexual Activity: Yes   Other Topics Concern  . None   Social History Narrative   Past Surgical History  Procedure Laterality Date  . Coronary artery bypass graft      LIMA to LAD 2009  . Umbilical hernia repair    . Anal fistulectomy    . Retrosigmoid craniectomy for tumor resection Right 10/11/2012    Procedure: RETROSIGMOID CRANIECTOMY FOR TUMOR RESECTION;  Surgeon: [name], MD;  Location: [facility];  Service: Neurosurgery;  Laterality: Right;  Craniotomy for acoustic neuroma  . Tracheostomy tube placement N/A 10/11/2012    Procedure: TRACHEOSTOMY;  Surgeon: Ascencion Dike, MD;  Location: [facility];  Service: ENT;  Laterality: N/A;  . Acoustic neuroma resection N/A 10/11/2012    Procedure: ACOUSTIC NEUROMA RESECTION;  Surgeon: Ascencion Dike, MD;  Location: [facility];  Service: ENT;  Laterality: N/A;  . Craniotomy Right 11/20/2012    Procedure: CRANIOTOMY REPAIR DURAL/CENTRAL SPINAL FLUID LEAK;  Surgeon: [name], MD;  Location: [facility];  Service: Neurosurgery;  Laterality: Right;  . Placement of lumbar drain N/A 11/20/2012    Procedure: Attempted PLACEMENT OF LUMBAR DRAIN;  Surgeon: [name], MD;  Location: [place];  Service: Neurosurgery;  Laterality: N/A;   Past Medical History  Diagnosis Date  . CAD (coronary artery disease)     Cath 09, 99% LAD  . Cardiomyopathy, ischemic     Ischemic  . Obesity   . Fibromyalgia   . Shortness of breath   . CHF (congestive heart failure) ([place])   . Hypertension     dr Percival Spanish  . Cyst near coccyx   . Diabetes mellitus without complication (HCC)     borderline  . Cyst near tailbone   . Kidney stones   . Myocardial infarction ([place])   . Headache(784.0)     with lights   BP 146/80 mmHg  Pulse 63  Resp 14  SpO2 91%  Opioid Risk Score:   Fall Risk Score:  `1  Depression screen PHQ 2/9  Depression screen PHQ 2/9 02/15/2015  Decreased Interest 0  Down, Depressed, Hopeless 0  PHQ - 2 Score 0  Altered  sleeping 0  Tired, decreased energy 0  Change in appetite 0  Feeling bad or failure about yourself  0  Trouble concentrating 0  Moving slowly or fidgety/restless 0  Suicidal thoughts 0  PHQ-9 Score 0      Review of Systems  Respiratory: Positive for shortness of breath.   Cardiovascular:       Limb swelling  Gastrointestinal: Positive for constipation.  Endocrine:       Low blood sugar   All other systems reviewed and are negative.      Objective:   Physical Exam  General: Alert and oriented x 3, No apparent distress. Wearing an eye patch on right.  HEENT: sclera and conjunctiva with ongoing irritation/redness. Visual acuity is decreased OD. He has patch on, but it remains well off eye. oral mucosa pink and moist. ext ear canals clear.  Neck: Supple without JVD or lymphadenopathy  Heart: Reg rate and rhythm. No murmurs rubs or gallops  Chest: CTA bilaterally without wheezes, rales, or rhonchi; no distress  Abdomen: Soft, non-tender, non-distended, bowel sounds positive.  Extremities: No clubbing, cyanosis, 2+ edema RLE, 1+ LLE with dry skin at soles of feet. Pulses are 2+  Skin: reddened areas under breasts  Neuro: Cognitively he's at baseline. He's a bit impulsive and distractible. He continues to have decreased lid closure, brow bending and facial weakness on the right (minimal improvement). Gaze appears generally conjugate. No hearing on the right. Strength is 4 to 5/5 all 4 extremities. Right limb ataxia persists. His balance is improved as a whole. He does well with his walker. He has atrophy over the right temple. Area remains sensitive.  Musculoskeletal: Full ROM, No pain with AROM or PROM in the neck, trunk, or extremities. Posture appropriate  Psych: Pt's affect is appropriate. Pt is cooperative and pleasant    Assessment & Plan:   1. 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  2. Headaches right temporal area related to tumor/surgery.  3. Lower extremity edema, morbid obesity  4. Ongoing eye irritation due to lack of lid closure and inadequate home mgt plan/occlusion   Plan:  1. Discussed solutions for patches in regard to his right eye to be more "occlusive". He may need to tape a patch on at night to help with this. 2. Maintain topamax at 200mg  qhs. Refilled tizanidine 90 day rx today 3. Refilled hydrocodone for pain control #120. Gave second rx for next month. He will check his drug formulary to see what's covered moving ahead  4. Will  make a referral to Dr. [name] for assessment of his right eye, ?weight placement for his lid. In the past I have referred patients to [facility] for this. Dr. Frederico Hamman informed me that he has worked with [facility] on lid weights in the past. 5. Follow up with me or NP in 2 months. 15 minutes of face to face patient care time were spent during this visit. All questions were encouraged and answered. Driving is still not advised

## 2015-10-18 NOTE — Patient Instructions (Signed)
YOU NEED TO FOLLOW UP WITH YOUR DENTIST REGARDING YOUR JAW PAIN.

## 2015-12-13 ENCOUNTER — Encounter: Payer: Medicare PPO | Attending: Physical Medicine & Rehabilitation | Admitting: Physical Medicine & Rehabilitation

## 2015-12-13 ENCOUNTER — Encounter: Payer: Self-pay | Admitting: Physical Medicine & Rehabilitation

## 2015-12-13 VITALS — BP 154/84 | HR 64 | Resp 14

## 2015-12-13 DIAGNOSIS — Z5181 Encounter for therapeutic drug level monitoring: Secondary | ICD-10-CM | POA: Insufficient documentation

## 2015-12-13 DIAGNOSIS — G510 Bell's palsy: Secondary | ICD-10-CM

## 2015-12-13 DIAGNOSIS — R51 Headache: Secondary | ICD-10-CM

## 2015-12-13 DIAGNOSIS — Z9889 Other specified postprocedural states: Secondary | ICD-10-CM | POA: Diagnosis not present

## 2015-12-13 DIAGNOSIS — Z79899 Other long term (current) drug therapy: Secondary | ICD-10-CM | POA: Diagnosis present

## 2015-12-13 DIAGNOSIS — S0451XS Injury of facial nerve, right side, sequela: Secondary | ICD-10-CM

## 2015-12-13 DIAGNOSIS — R519 Headache, unspecified: Secondary | ICD-10-CM

## 2015-12-13 DIAGNOSIS — D333 Benign neoplasm of cranial nerves: Secondary | ICD-10-CM

## 2015-12-13 DIAGNOSIS — G894 Chronic pain syndrome: Secondary | ICD-10-CM | POA: Diagnosis not present

## 2015-12-13 DIAGNOSIS — G939 Disorder of brain, unspecified: Secondary | ICD-10-CM

## 2015-12-13 MED ORDER — HYDROCODONE-ACETAMINOPHEN 10-325 MG PO TABS
ORAL_TABLET | ORAL | Status: DC
Start: 2015-12-13 — End: 2016-02-14

## 2015-12-13 MED ORDER — HYDROCODONE-ACETAMINOPHEN 10-325 MG PO TABS: ORAL_TABLET | ORAL | Status: DC

## 2015-12-13 MED ORDER — GABAPENTIN 100 MG PO CAPS: 100.0000 mg | ORAL_CAPSULE | Freq: Three times a day (TID) | ORAL | Status: DC

## 2015-12-13 NOTE — Progress Notes (Signed)
Subjective:    Patient ID: [name], male    DOB: Dec 11, 1960, 55 y.o.   MRN: [id number]  HPI   [name] is here in follow up of his chronic pain. He has questions about his gabapentin. Apparently he hasn't taken it for a while. It appears that it was electronically sent in to his pharmacy on Friday. He feels that it's helped his neuropathic pain.   He is seeing Dr. Frederico Hamman regarding his right eye. He has an appt this week for cardiac clearance prior to any corrective surgery.   He continues with his eye patch over the right eye. He has ongoing redness and irritation over the eye which can be variable.   Pain Inventory Average Pain 7 Pain Right Now NA My pain is stabbing and tingling  In the last 24 hours, has pain interfered with the following? General activity NA Relation with others NA Enjoyment of life NA What TIME of day is your pain at its worst? daytime Sleep (in general) Fair  Pain is worse with: bending and some activites Pain improves with: rest and medication Relief from Meds: 6  Mobility walk with assistance use a walker how many minutes can you walk? 5 ability to climb steps?  no do you drive?  no  Function not employed: date last employed NA disabled: date disabled NA I need assistance with the following:  dressing and bathing  Neuro/Psych numbness trouble walking  Prior Studies Any changes since last visit?  no  Physicians involved in your care Any changes since last visit?  no   Family History  Problem Relation Age of Onset  . CAD Father    Social History   Social History  . Marital Status: Married    Spouse Name: N/A  . Number of Children: N/A  . Years of Education: N/A   Social History Main Topics  . Smoking status: Light Tobacco Smoker -- 0.50 packs/day for 20 years    Types: Cigarettes  . Smokeless tobacco: Current User     Comment: Rare tobacco.  "[name]"  . Alcohol Use: No  . Drug Use: No  . Sexual  Activity: Yes   Other Topics Concern  . None   Social History Narrative   Past Surgical History  Procedure Laterality Date  . Coronary artery bypass graft      LIMA to LAD 2009  . Umbilical hernia repair    . Anal fistulectomy    . Retrosigmoid craniectomy for tumor resection Right 10/11/2012    Procedure: RETROSIGMOID CRANIECTOMY FOR TUMOR RESECTION;  Surgeon: [name], MD;  Location: [place] [facility];  Service: Neurosurgery;  Laterality: Right;  Craniotomy for acoustic neuroma  . Tracheostomy tube placement N/A 10/11/2012    Procedure: TRACHEOSTOMY;  Surgeon: Ascencion Dike, MD;  Location: [facility];  Service: ENT;  Laterality: N/A;  . Acoustic neuroma resection N/A 10/11/2012    Procedure: ACOUSTIC NEUROMA RESECTION;  Surgeon: Ascencion Dike, MD;  Location: [place] [facility];  Service: ENT;  Laterality: N/A;  . Craniotomy Right 11/20/2012    Procedure: CRANIOTOMY REPAIR DURAL/CENTRAL SPINAL FLUID LEAK;  Surgeon: [name], MD;  Location: [place] [facility];  Service: Neurosurgery;  Laterality: Right;  . Placement of lumbar drain N/A 11/20/2012    Procedure: Attempted PLACEMENT OF LUMBAR DRAIN;  Surgeon: [name], MD;  Location: [place] [facility];  Service: Neurosurgery;  Laterality: N/A;   Past Medical History  Diagnosis Date  .  CAD (coronary artery disease)     Cath 09, 99% LAD  . Cardiomyopathy, ischemic     Ischemic  . Obesity   . Fibromyalgia   . Shortness of breath   . CHF (congestive heart failure) ([place])   . Hypertension     dr Percival Spanish  . Cyst near coccyx   . Diabetes mellitus without complication (HCC)     borderline  . Cyst near tailbone   . Kidney stones   . Myocardial infarction ([place])   . Headache(784.0)     with lights   BP 154/84 mmHg  Pulse 64  Resp 14  SpO2 94%  Opioid Risk Score:   Fall Risk Score:  `1  Depression screen PHQ 2/9  Depression screen PHQ 2/9 02/15/2015  Decreased Interest 0  Down, Depressed, Hopeless 0  PHQ - 2 Score 0  Altered sleeping 0    Tired, decreased energy 0  Change in appetite 0  Feeling bad or failure about yourself  0  Trouble concentrating 0  Moving slowly or fidgety/restless 0  Suicidal thoughts 0  PHQ-9 Score 0     Review of Systems  Cardiovascular:       Limb swelling  Musculoskeletal: Positive for gait problem.  Neurological: Positive for numbness.  All other systems reviewed and are negative.      Objective:   Physical Exam  General: Alert and oriented x 3, No apparent distress. Wearing an eye patch on right.  HEENT: sclera and conjunctiva with ongoing irritation/redness. Visual acuity is decreased OD. He has patch on, but it remains well off eye. oral mucosa pink and moist. ext ear canals clear.  Neck: Supple without JVD or lymphadenopathy  Heart: Reg rate and rhythm. No murmurs rubs or gallops  Chest: CTA bilaterally without wheezes, rales, or rhonchi; no distress  Abdomen: Soft, non-tender, non-distended, bowel sounds positive.  Extremities: No clubbing, cyanosis, 2+ edema RLE, 1+ LLE with dry skin at soles of feet. Pulses are 2+  Skin: reddened areas under breasts  Neuro: Cognitively he's at baseline. He's a bit impulsive and distractible. He continues to have decreased lid closure, brow bending and facial weakness on the right (minimal improvement). Gaze appears generally conjugate. No hearing on the right. Strength is 4 to 5/5 all 4 extremities. Right limb ataxia persists---he compensates with use of his walker. He has atrophy over the right temple. Area remains sensitive to touch.  Musculoskeletal: Full ROM, No pain with AROM or PROM in the neck, trunk, or extremities. Posture appropriate  Psych: Pt's affect is appropriate. Pt is cooperative and pleasant    Assessment & Plan:   1. 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  2. Headaches right temporal area related to  tumor/surgery.  3. Lower extremity edema, morbid obesity  4. Ongoing eye irritation due to lack of lid closure and inadequate home mgt plan/occlusion   Plan:  1. Refilled gabapentin for 3 month supply.  2. Maintain topamax at 200mg  qhs. Refilled tizanidine 90 day rx today 3. Refilled hydrocodone for pain control #120. Gave second rx for next month. He will check his drug formulary to see what's covered moving ahead  4. Follow up neuro-eye regarding corrective surgery right eye/lid. Cardiac clearance prior. 5. Follow up with me or NP in 2 months. 15 minutes of face to face patient care time were spent during this visit. All questions were encouraged and answered. Driving is still not  advised

## 2015-12-13 NOTE — Patient Instructions (Signed)
INCREASE YOUR FIBER, FLUIDS   SENNA-S======TAKE 1-2 AT BEDTIME UP. CAN INCREASE TO 3-4 IF NEEDED  (SOFTENER PLUS LAXATIVE)

## 2015-12-15 ENCOUNTER — Ambulatory Visit: Payer: Medicare PPO | Admitting: Cardiovascular Disease

## 2015-12-15 ENCOUNTER — Ambulatory Visit (INDEPENDENT_AMBULATORY_CARE_PROVIDER_SITE_OTHER): Payer: Medicare PPO | Admitting: Cardiovascular Disease

## 2015-12-15 ENCOUNTER — Encounter: Payer: Self-pay | Admitting: Cardiovascular Disease

## 2015-12-15 VITALS — BP 122/60 | HR 68 | Ht 70.0 in | Wt 364.6 lb

## 2015-12-15 DIAGNOSIS — R0602 Shortness of breath: Secondary | ICD-10-CM

## 2015-12-15 DIAGNOSIS — Z951 Presence of aortocoronary bypass graft: Secondary | ICD-10-CM | POA: Diagnosis not present

## 2015-12-15 DIAGNOSIS — Z01818 Encounter for other preprocedural examination: Secondary | ICD-10-CM

## 2015-12-15 DIAGNOSIS — Z720 Tobacco use: Secondary | ICD-10-CM

## 2015-12-15 NOTE — Assessment & Plan Note (Signed)
History of CAD status post artery bypass grafting X 1 n 2009 by Dr. Tharon Aquas Trigt with a LIMA to his LAD.he had a stress echo performed 09/18/12 which was done for preoperative clearance and this was normal. He denies chest pain but does get some shortness of breath on exertion. He is scheduled for eye surgery which will apparently require our of general anesthesia. She has been a year since his bypass surgery and he continued to smoke I'm going to get a thrombotic stress test to risk stratify him.

## 2015-12-15 NOTE — Patient Instructions (Signed)
Medication Instructions:  Your physician recommends that you continue on your current medications as directed. Please refer to the Current Medication list given to you today.   Labwork: I will get your lab work from your Primary Care Physician.   Testing/Procedures: Your physician has requested that you have a lexiscan myoview. For further information please visit [email or web address]. Please follow instruction sheet, as given.  Your physician has requested that you have an echocardiogram. Echocardiography is a painless test that uses sound waves to create images of your heart. It provides your doctor with information about the size and shape of your heart and how well your heart's chambers and valves are working. This procedure takes approximately one hour. There are no restrictions for this procedure.   Follow-Up: Follow up with Dr. Gwenlyn Found as needed.   Any Other Special Instructions Will Be Listed Below (If Applicable).     If you need a refill on your cardiac medications before your next appointment, please call your pharmacy.

## 2015-12-15 NOTE — Progress Notes (Signed)
12/15/2015 [name]   05-23-1961  [id number]  Primary Physician Leonard Downing, MD Primary Cardiologist: Lorretta Harp MD Renae Gloss   HPI:  [name] is a 55 year old morbidly overweight married Caucasian male with no children who is currently disabled and is referred by his primary care physician, Dr. Claris Gower, for preoperative clearance prior to eye surgery. He is formerly a patient of Dr. Rosezella [place] but is seeing  me because oftiming issues. He has a history of coronary artery disease status post bypass grafting with a LIMA to his LAD by Dr. Prescott Gum 2009. He had a normal stress echo in 2014 for preoperative clearance which was normal.. He has had extensive brain surgery in the past and a stroke. 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 He walks with the aid of a walker.   Current Outpatient Prescriptions  Medication Sig Dispense Refill  . albuterol (PROVENTIL HFA;VENTOLIN HFA) 108 (90 BASE) MCG/ACT inhaler Inhale 2 puffs into the lungs every 6 (six) hours as needed for wheezing or shortness of breath. 1 Inhaler 1  . antiseptic oral rinse (BIOTENE) LIQD 15 mLs by Mouth Rinse route at bedtime.    Marland Kitchen artificial tears (LACRILUBE) OINT ophthalmic ointment Place 1 application into the right eye at bedtime.    Marland Kitchen atenolol (TENORMIN) 50 MG tablet Take 1 tablet (50 mg total) by mouth 2 (two) times daily. Note increased dose. 60 tablet 1  . benzocaine (ORAJEL) 10 % mucosal gel Use as directed 1 application in the mouth or throat 2 (two) times daily as needed for pain.    Marland Kitchen colchicine 0.6 MG tablet Take 1 tablet (0.6 mg total) by mouth daily. 30 tablet 1  . furosemide (LASIX) 40 MG tablet Take 40 mg by mouth daily.    Marland Kitchen gabapentin (NEURONTIN) 100 MG capsule Take 1 capsule (100 mg total) by mouth 3 (three) times daily. 3 month supply 270 capsule 3  . HYDROcodone-acetaminophen (NORCO) 10-325 MG tablet TAKE 1 TABLET BY  MOUTH EVERY 6 HOURS AS NEEDED FOR PAIN MUST LAST 30 DAYS 120 tablet 0  . Multiple Vitamin (MULTIVITAMIN WITH MINERALS) TABS Take 1 tablet by mouth daily.    Marland Kitchen nystatin (MYCOSTATIN/NYSTOP) 100000 UNIT/GM POWD Apply 1 g topically 2 (two) times daily as needed. 90 day supply 180 g 3  . penicillin v potassium (VEETID) 500 MG tablet Take 500 mg by mouth 4 (four) times daily.  1  . Skin Protectants, Misc. (EUCERIN) cream Apply 1 application topically 3 (three) times daily.    Marland Kitchen tiZANidine (ZANAFLEX) 4 MG tablet Take 1 tablet (4 mg total) by mouth 3 (three) times daily as needed for muscle spasms. 90 day supply 270 tablet 2  . topiramate (TOPAMAX) 100 MG tablet Take 2 tablets (200 mg total) by mouth at bedtime. 3 month rx 180 tablet 5   No current facility-administered medications for this visit.    Allergies  Allergen Reactions  . Morphine And Related Anaphylaxis    "makes me stop breathing"  . Ivp Dye [Iodinated Diagnostic Agents]     Passing out  . Other     Steroids makes hearts race    Social History   Social History  . Marital Status: Married    Spouse Name: N/A  . Number of Children: N/A  . Years of Education: N/A   Occupational History  . Not on file.   Social History Main Topics  . Smoking  status: Light Tobacco Smoker -- 0.50 packs/day for 20 years    Types: Cigarettes  . Smokeless tobacco: Current User     Comment: Rare tobacco.  "[name]"  . Alcohol Use: No  . Drug Use: No  . Sexual Activity: Yes   Other Topics Concern  . Not on file   Social History Narrative     Review of Systems: General: negative for chills, fever, night sweats or weight changes.  Cardiovascular: negative for chest pain, dyspnea on exertion, edema, orthopnea, palpitations, paroxysmal nocturnal dyspnea or shortness of breath Dermatological: negative for rash Respiratory: negative for cough or wheezing Urologic: negative for hematuria Abdominal: negative for nausea, vomiting,  diarrhea, bright red blood per rectum, melena, or hematemesis Neurologic: negative for visual changes, syncope, or dizziness All other systems reviewed and are otherwise negative except as noted above.    Blood pressure 122/60, pulse 68, height 5\' 10"  (1.778 m), weight 364 lb 9.6 oz (165.381 kg).  General appearance: alert and no distress Neck: no adenopathy, no carotid bruit, no JVD, supple, symmetrical, trachea midline and thyroid not enlarged, symmetric, no tenderness/mass/nodules Lungs: clear to auscultation bilaterally Heart: regular rate and rhythm, S1, S2 normal, no murmur, click, rub or gallop Extremities: extremities normal, atraumatic, no cyanosis or edema  EKG not performed today.EKG performed by his PCP showed sinus rhythm with poor R-wave progression and right axis deviation.  ASSESSMENT AND PLAN:   CAD (coronary artery disease) of artery bypass graft History of CAD status post artery bypass grafting X 1 n 2009 by Dr. Tharon Aquas Trigt with a LIMA to his LAD.he had a stress echo performed 09/18/12 which was done for preoperative clearance and this was normal. He denies chest pain but does get some shortness of breath on exertion. He is scheduled for eye surgery which will apparently require our of general anesthesia. She has been a year since his bypass surgery and he continued to smoke I'm going to get a thrombotic stress test to risk stratify him.  Tobacco abuse History of continued tobacco abuse recalcitrant risk modification      Lorretta Harp MD [facility], [facility] 12/15/2015 11:37 AM

## 2015-12-15 NOTE — Assessment & Plan Note (Signed)
History of continued tobacco abuse recalcitrant risk modification

## 2015-12-27 ENCOUNTER — Ambulatory Visit: Payer: Medicare PPO | Admitting: Cardiovascular Disease

## 2015-12-29 ENCOUNTER — Telehealth: Payer: Self-pay | Admitting: Cardiovascular Disease

## 2015-12-29 NOTE — Telephone Encounter (Signed)
Stress test scheduled for 5/18 & 5/19 Message routed Maudie Mercury, [place], RN

## 2015-12-29 NOTE — Telephone Encounter (Signed)
Giovanna called in requesting to have the results to the pt's stress test be faxed to the office for Dr. [name] to review before his eye surgery is scheduled. The fax number is [phone] or [phone]. Please f/u with her  Thanks

## 2016-01-04 ENCOUNTER — Telehealth (HOSPITAL_COMMUNITY): Payer: Self-pay

## 2016-01-04 NOTE — Telephone Encounter (Signed)
Encounter complete. 

## 2016-01-06 ENCOUNTER — Ambulatory Visit (HOSPITAL_COMMUNITY)
Admission: RE | Admit: 2016-01-06 | Discharge: 2016-01-06 | Disposition: A | Discharge status: Home / Self Care | Payer: Medicare PPO | Source: Ambulatory Visit | Attending: Cardiovascular Disease | Admitting: Cardiovascular Disease

## 2016-01-06 DIAGNOSIS — I252 Old myocardial infarction: Secondary | ICD-10-CM | POA: Insufficient documentation

## 2016-01-06 DIAGNOSIS — I2510 Atherosclerotic heart disease of native coronary artery without angina pectoris: Secondary | ICD-10-CM | POA: Insufficient documentation

## 2016-01-06 DIAGNOSIS — Z01818 Encounter for other preprocedural examination: Secondary | ICD-10-CM | POA: Insufficient documentation

## 2016-01-06 DIAGNOSIS — R0609 Other forms of dyspnea: Secondary | ICD-10-CM | POA: Diagnosis not present

## 2016-01-06 DIAGNOSIS — Z951 Presence of aortocoronary bypass graft: Secondary | ICD-10-CM | POA: Insufficient documentation

## 2016-01-06 DIAGNOSIS — E669 Obesity, unspecified: Secondary | ICD-10-CM | POA: Insufficient documentation

## 2016-01-06 DIAGNOSIS — Z6841 Body Mass Index (BMI) 40.0 and over, adult: Secondary | ICD-10-CM | POA: Insufficient documentation

## 2016-01-06 DIAGNOSIS — Z8673 Personal history of transient ischemic attack (TIA), and cerebral infarction without residual deficits: Secondary | ICD-10-CM | POA: Diagnosis not present

## 2016-01-06 DIAGNOSIS — Z87891 Personal history of nicotine dependence: Secondary | ICD-10-CM | POA: Diagnosis not present

## 2016-01-06 DIAGNOSIS — R0602 Shortness of breath: Secondary | ICD-10-CM | POA: Diagnosis not present

## 2016-01-06 MED ORDER — AMINOPHYLLINE 25 MG/ML IV SOLN
125.0000 mg | Freq: Once | INTRAVENOUS | Status: AC
  Administered 2016-01-06: 125 mg via INTRAVENOUS

## 2016-01-06 MED ORDER — REGADENOSON 0.4 MG/5ML IV SOLN
0.4000 mg | Freq: Once | INTRAVENOUS | Status: AC
  Administered 2016-01-06: 0.4 mg via INTRAVENOUS

## 2016-01-06 MED ORDER — TECHNETIUM TC 99M TETROFOSMIN IV KIT
30.6000 | PACK | Freq: Once | INTRAVENOUS | Status: AC | PRN
  Administered 2016-01-06: 30.6 via INTRAVENOUS
  Filled 2016-01-06: qty 31

## 2016-01-07 ENCOUNTER — Ambulatory Visit (HOSPITAL_BASED_OUTPATIENT_CLINIC_OR_DEPARTMENT_OTHER)
Admission: RE | Admit: 2016-01-07 | Discharge: 2016-01-07 | Disposition: A | Discharge status: Home / Self Care | Payer: Medicare PPO | Source: Ambulatory Visit | Attending: Cardiovascular Disease | Admitting: Cardiovascular Disease

## 2016-01-07 ENCOUNTER — Ambulatory Visit (HOSPITAL_COMMUNITY)
Admission: RE | Admit: 2016-01-07 | Discharge: 2016-01-07 | Disposition: A | Discharge status: Home / Self Care | Payer: Medicare PPO | Source: Ambulatory Visit | Attending: Internal Medicine | Admitting: Internal Medicine

## 2016-01-07 DIAGNOSIS — Z951 Presence of aortocoronary bypass graft: Secondary | ICD-10-CM

## 2016-01-07 DIAGNOSIS — Z01818 Encounter for other preprocedural examination: Secondary | ICD-10-CM

## 2016-01-07 DIAGNOSIS — R0602 Shortness of breath: Secondary | ICD-10-CM | POA: Insufficient documentation

## 2016-01-07 DIAGNOSIS — Z01810 Encounter for preprocedural cardiovascular examination: Secondary | ICD-10-CM | POA: Insufficient documentation

## 2016-01-07 DIAGNOSIS — Z6841 Body Mass Index (BMI) 40.0 and over, adult: Secondary | ICD-10-CM | POA: Insufficient documentation

## 2016-01-07 DIAGNOSIS — I2510 Atherosclerotic heart disease of native coronary artery without angina pectoris: Secondary | ICD-10-CM | POA: Diagnosis not present

## 2016-01-07 LAB — MYOCARDIAL PERFUSION IMAGING
CHL CUP NUCLEAR SDS: 0
CSEPPHR: 69 {beats}/min
LVDIAVOL: 149 mL (ref 62–150)
LVSYSVOL: 76 mL
Rest HR: 50 {beats}/min
SRS: 2
SSS: 2
TID: 0.99

## 2016-01-14 ENCOUNTER — Ambulatory Visit: Payer: Medicare PPO | Admitting: Cardiology

## 2016-01-24 ENCOUNTER — Telehealth: Payer: Self-pay | Admitting: Cardiology

## 2016-01-24 NOTE — Telephone Encounter (Signed)
Spoke with office and advised no chest xray done

## 2016-01-24 NOTE — Telephone Encounter (Signed)
New message    The office just needs the x-ray to faxed to the office [phone]

## 2016-01-26 NOTE — H&P (Signed)
[name] is an 55 y.o. male.   Chief Complaint: Of ptosis at his right eyelid,diplopia,blurred vision,right lower lid ectropion.  HPI: residual esotropia,ptosis of right eyelid,double vision.  Past Medical History  Diagnosis Date  . CAD (coronary artery disease)     Cath 09, 99% LAD  . Cardiomyopathy, ischemic     Ischemic  . Obesity   . Fibromyalgia   . Shortness of breath   . CHF (congestive heart failure) ([place])   . Hypertension     dr Percival Spanish  . Cyst near coccyx   . Diabetes mellitus without complication (HCC)     borderline  . Cyst near tailbone   . Kidney stones   . Myocardial infarction ([place])   . Headache(784.0)     with lights    Past Surgical History  Procedure Laterality Date  . Coronary artery bypass graft      LIMA to LAD 2009  . Umbilical hernia repair    . Anal fistulectomy    . Retrosigmoid craniectomy for tumor resection Right 10/11/2012    Procedure: RETROSIGMOID CRANIECTOMY FOR TUMOR RESECTION;  Surgeon: [name], MD;  Location: [place] [facility];  Service: Neurosurgery;  Laterality: Right;  Craniotomy for acoustic neuroma  . Tracheostomy tube placement N/A 10/11/2012    Procedure: TRACHEOSTOMY;  Surgeon: Ascencion Dike, MD;  Location: [facility];  Service: ENT;  Laterality: N/A;  . Acoustic neuroma resection N/A 10/11/2012    Procedure: ACOUSTIC NEUROMA RESECTION;  Surgeon: Ascencion Dike, MD;  Location: [place] [facility];  Service: ENT;  Laterality: N/A;  . Craniotomy Right 11/20/2012    Procedure: CRANIOTOMY REPAIR DURAL/CENTRAL SPINAL FLUID LEAK;  Surgeon: [name], MD;  Location: [place] [facility];  Service: Neurosurgery;  Laterality: Right;  . Placement of lumbar drain N/A 11/20/2012    Procedure: Attempted PLACEMENT OF LUMBAR DRAIN;  Surgeon: [name], MD;  Location: [place] [facility];  Service: Neurosurgery;  Laterality: N/A;    Family History  Problem Relation Age of Onset  . CAD Father    Social History:  reports that he has been smoking  Cigarettes.  He has a 10 pack-year smoking history. He uses smokeless tobacco. He reports that he does not drink alcohol or use illicit drugs.  Allergies:  Allergies  Allergen Reactions  . Morphine And Related Anaphylaxis    "makes me stop breathing"  . Ivp Dye [Iodinated Diagnostic Agents]     Passing out  . Other     Steroids makes hearts race    No prescriptions prior to admission    No results found for this or any previous visit (from the past 70 hour(s)). No results found.  Review of Systems  HENT: Negative.   Eyes: Positive for blurred vision and double vision.       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  Cardiovascular:       *History of CVA (2004)  Gastrointestinal: Negative.   Genitourinary: Negative.   Musculoskeletal: Negative.   Skin: Negative.   Neurological: Negative.   Endo/Heme/Allergies: Negative.   Psychiatric/Behavioral: Negative.     There were no vitals taken for this visit. Physical Exam  Eyes:       Assessment/Plan Patient presents for right lateral rectus resection,superior rectus recession right eye adjustable sutures right eye,right lateral tarsal strip. For correction of esotropia,diplopia and ectropion of the right eye.  Dara Hoyer, MD 01/26/2016, 8:44 AM

## 2016-01-27 NOTE — Progress Notes (Addendum)
REVIEWED PT CHART.  PT BMI IS 52.2. PER GUIDELINES PT IS OVER THE LIMIT OF 50 BMI.  CALLED DR SPENCER OFFICE AND SPOKE W/  NICOLE/GIOVANNA TO INFORM OF THIS AND THAT PT WILL NEED TO BE MOVED.

## 2016-02-02 ENCOUNTER — Encounter (HOSPITAL_BASED_OUTPATIENT_CLINIC_OR_DEPARTMENT_OTHER): Admission: RE | Payer: Self-pay | Source: Ambulatory Visit

## 2016-02-02 ENCOUNTER — Ambulatory Visit (HOSPITAL_BASED_OUTPATIENT_CLINIC_OR_DEPARTMENT_OTHER): Admission: RE | Admit: 2016-02-02 | Payer: Medicare PPO | Source: Ambulatory Visit | Admitting: Ophthalmology

## 2016-02-02 SURGERY — ADJUSTABLE SUTURE MANIPULATION
Anesthesia: General | Site: Eye | Laterality: Right

## 2016-02-02 SURGERY — REPAIR, MUSCLE, MEDIAL RECTUS
Anesthesia: General | Site: Eye | Laterality: Right

## 2016-02-08 NOTE — H&P (Signed)
[name] is an 55 y.o. male.   Chief Complaint: esotropia, diplopia, ectropion right eye  HPI: [birth year]55 yo WM with h/o 6 n palsy, et, diplopia and s/p CVA presents for elective RLR resection, superior rectus recession with adjustable suture, right lateral tarsal strip for correction of esotropia and diplopia.  Past Medical History  Diagnosis Date  . CAD (coronary artery disease)     Cath 09, 99% LAD  . Cardiomyopathy, ischemic     Ischemic  . Obesity   . Fibromyalgia   . Shortness of breath   . CHF (congestive heart failure) ([place])   . Hypertension     dr Percival Spanish  . Cyst near coccyx   . Diabetes mellitus without complication (HCC)     borderline  . Cyst near tailbone   . Kidney stones   . Myocardial infarction ([place])   . Headache(784.0)     with lights    Past Surgical History  Procedure Laterality Date  . Coronary artery bypass graft      LIMA to LAD 2009  . Umbilical hernia repair    . Anal fistulectomy    . Retrosigmoid craniectomy for tumor resection Right 10/11/2012    Procedure: RETROSIGMOID CRANIECTOMY FOR TUMOR RESECTION;  Surgeon: [name], MD;  Location: [place] [facility];  Service: Neurosurgery;  Laterality: Right;  Craniotomy for acoustic neuroma  . Tracheostomy tube placement N/A 10/11/2012    Procedure: TRACHEOSTOMY;  Surgeon: Ascencion Dike, MD;  Location: [facility];  Service: ENT;  Laterality: N/A;  . Acoustic neuroma resection N/A 10/11/2012    Procedure: ACOUSTIC NEUROMA RESECTION;  Surgeon: Ascencion Dike, MD;  Location: [place] [facility];  Service: ENT;  Laterality: N/A;  . Craniotomy Right 11/20/2012    Procedure: CRANIOTOMY REPAIR DURAL/CENTRAL SPINAL FLUID LEAK;  Surgeon: [name], MD;  Location: [place] [facility];  Service: Neurosurgery;  Laterality: Right;  . Placement of lumbar drain N/A 11/20/2012    Procedure: Attempted PLACEMENT OF LUMBAR DRAIN;  Surgeon: [name], MD;  Location: [place] [facility];  Service: Neurosurgery;  Laterality: N/A;    Family  History  Problem Relation Age of Onset  . CAD Father    Social History:  reports that he has been smoking Cigarettes.  He has a 10 pack-year smoking history. He uses smokeless tobacco. He reports that he does not drink alcohol or use illicit drugs.  Allergies:  Allergies  Allergen Reactions  . Morphine And Related Anaphylaxis    "makes me stop breathing"  . Ivp Dye [Iodinated Diagnostic Agents]     Passing out  . Other     Steroids makes hearts race    No prescriptions prior to admission    No results found for this or any previous visit (from the past 23 hour(s)). No results found.  Review of Systems  Constitutional: Negative.   HENT: Negative.   Eyes: Positive for blurred vision and double vision.       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   Cardiovascular:       History of CVA (2014)  Gastrointestinal: Negative.   Genitourinary: Negative.   Musculoskeletal: Negative.   Skin: Negative.   Neurological: Negative.   Endo/Heme/Allergies: Negative.   Psychiatric/Behavioral: Negative.     There were no vitals taken for this visit. Physical Exam  Eyes:       Assessment/Plan Schedule pt for RLR resection, superior rectus recession with adjustable suture, right lateral tarsal strip. Schedule one day post-op and one week post-op   [name] A, MD 02/08/2016, 1:11 PM

## 2016-02-10 NOTE — Pre-Procedure Instructions (Signed)
[name]  02/10/2016     Your procedure is scheduled on : Wednesday February 16, 2016 at 10:00 AM.  Report to [facility] Admitting at 8:00 AM.  Call this number if you have problems the morning of surgery: [phone]    Remember:  Do not eat food or drink liquids after midnight.  Take these medicines the morning of surgery with A SIP OF WATER : Albuterol inhaler if needed (Bring inhaler with you), Atenolol, Hydrocodone if needed, Isosorbide (Imdur)   Stop taking any vitamins, herbal medications/supplements, NSAIDs, Ibuprofen, Advil, Motrin, Aleve, etc today   Do not wear jewelry.  Do not wear lotions, powders, or cologne.    Men may shave face and neck.  Do not bring valuables to the hospital.  [facility] is not responsible for any belongings or valuables.  Contacts, dentures or bridgework may not be worn into surgery.  Leave your suitcase in the car.  After surgery it may be brought to your room.  For patients admitted to the hospital, discharge time will be determined by your treatment team.  Patients discharged the day of surgery will not be allowed to drive home.   Name and phone number of your driver:    Special instructions:  Shower using CHG soap the night before and the morning of your surgery  Please read over the following fact sheets that you were given. Pain Booklet

## 2016-02-11 ENCOUNTER — Encounter (HOSPITAL_COMMUNITY)
Admission: RE | Admit: 2016-02-11 | Discharge: 2016-02-11 | Disposition: A | Discharge status: Home / Self Care | Payer: Medicare PPO | Source: Ambulatory Visit | Attending: Ophthalmology | Admitting: Ophthalmology

## 2016-02-11 ENCOUNTER — Encounter (HOSPITAL_COMMUNITY): Payer: Self-pay

## 2016-02-11 DIAGNOSIS — I509 Heart failure, unspecified: Secondary | ICD-10-CM | POA: Diagnosis not present

## 2016-02-11 DIAGNOSIS — E119 Type 2 diabetes mellitus without complications: Secondary | ICD-10-CM | POA: Insufficient documentation

## 2016-02-11 DIAGNOSIS — I255 Ischemic cardiomyopathy: Secondary | ICD-10-CM | POA: Diagnosis not present

## 2016-02-11 DIAGNOSIS — H5000 Unspecified esotropia: Secondary | ICD-10-CM | POA: Diagnosis not present

## 2016-02-11 DIAGNOSIS — F17200 Nicotine dependence, unspecified, uncomplicated: Secondary | ICD-10-CM | POA: Insufficient documentation

## 2016-02-11 DIAGNOSIS — Z01818 Encounter for other preprocedural examination: Secondary | ICD-10-CM | POA: Insufficient documentation

## 2016-02-11 DIAGNOSIS — Z8673 Personal history of transient ischemic attack (TIA), and cerebral infarction without residual deficits: Secondary | ICD-10-CM | POA: Insufficient documentation

## 2016-02-11 DIAGNOSIS — I252 Old myocardial infarction: Secondary | ICD-10-CM | POA: Diagnosis not present

## 2016-02-11 DIAGNOSIS — I110 Hypertensive heart disease with heart failure: Secondary | ICD-10-CM | POA: Diagnosis not present

## 2016-02-11 DIAGNOSIS — Z951 Presence of aortocoronary bypass graft: Secondary | ICD-10-CM | POA: Diagnosis not present

## 2016-02-11 DIAGNOSIS — Z01812 Encounter for preprocedural laboratory examination: Secondary | ICD-10-CM | POA: Insufficient documentation

## 2016-02-11 DIAGNOSIS — I2510 Atherosclerotic heart disease of native coronary artery without angina pectoris: Secondary | ICD-10-CM | POA: Insufficient documentation

## 2016-02-11 DIAGNOSIS — Z79899 Other long term (current) drug therapy: Secondary | ICD-10-CM | POA: Insufficient documentation

## 2016-02-11 HISTORY — DX: Spontaneous ecchymoses: R23.3

## 2016-02-11 HISTORY — DX: Other skin changes: R23.8

## 2016-02-11 HISTORY — DX: Gout, unspecified: M10.9

## 2016-02-11 HISTORY — DX: Localized edema: R60.0

## 2016-02-11 HISTORY — DX: Neoplasm of unspecified behavior of brain: D49.6

## 2016-02-11 HISTORY — DX: Cerebral infarction, unspecified: I63.9

## 2016-02-11 HISTORY — DX: Pneumonia, unspecified organism: J18.9

## 2016-02-11 LAB — BASIC METABOLIC PANEL
ANION GAP: 7 (ref 5–15)
BUN: 14 mg/dL (ref 6–20)
CHLORIDE: 107 mmol/L (ref 101–111)
CO2: 27 mmol/L (ref 22–32)
Calcium: 9.3 mg/dL (ref 8.9–10.3)
Creatinine, Ser: 1.19 mg/dL (ref 0.61–1.24)
GFR calc Af Amer: >60 mL/min (ref >60)
GLUCOSE: 128 mg/dL — AB (ref 65–99)
POTASSIUM: 4.3 mmol/L (ref 3.5–5.1)
Sodium: 141 mmol/L (ref 135–145)

## 2016-02-11 LAB — CBC
HEMATOCRIT: 56.1 % — AB (ref 39.0–52.0)
HEMOGLOBIN: 16.9 g/dL (ref 13.0–17.0)
MCH: 30.7 pg (ref 26.0–34.0)
MCHC: 30.1 g/dL (ref 30.0–36.0)
MCV: 101.8 fL — AB (ref 78.0–100.0)
Platelets: 185 10*3/uL (ref 150–400)
RBC: 5.51 MIL/uL (ref 4.22–5.81)
RDW: 13.4 % (ref 11.5–15.5)
WBC: 9.2 10*3/uL (ref 4.0–10.5)

## 2016-02-11 NOTE — Progress Notes (Signed)
PCP is Leonard Downing  Cardiologist is Quay Burow  Patient denied having any acute cardiac issues, and stated he used one puff of Albuterol inhaler today.   During PAT visit, Nurse explained to patient the importance of using CHG soap the night before and the morning of surgery. Patient informed Nurse that his wife only gives him "sponge baths," and patient voiced concern about CHG shower. Nurse then explained to patient that his wife could give him a sponge bath using his regular soap first, and then the water needed to be changed, and the CHG soap could be applied, but it needed to be rinsed off. Nurse also told patient not to put CHG soap on his face, hair, or private area. Patient laughed and stated "well where am I to put it then?" Nurse explained in even further detail about where surgical scrub should and should not be used. Patient then stated "well I'm sensitive to a lot of stuff so I don't know.....can't I use plain water the night before?" Nurse explained to patient that the CHG soap was needed to kill bacterial that is present on his skin, and that plain water would not work. Nurse also informed patient that CHG cloths could be used the DOS if he was unable to use CHG soap the night before and the morning of surgery. Then Nurse instructed patient to apply a small amount of CHG soap to skin before surgery and rinse it off to see if it causes any skin irritation, and if it did to use dial antibacterial soap instead. Patient then took the CHG soap off the counter, opened it, applied a small amount onto his left hand, and rubbed it in. Nurse explained to patient that the soap could not be left on his skin and that he needed to rinse it off. Afterwards Nurse took patient to the sink to rinse CHG soap off his hand. Patient did not voice any complaints of skin irritation.   Will send chart to Anesthesia for review

## 2016-02-11 NOTE — Progress Notes (Signed)
Nurse called Dr. Sunday Corn office for clarification on surgery time because patient currently has two case number bookings in [facility] Jessica (office staff) stated that the booking was correct and that patient would need to "wake up" after having the first part of his surgery, and then Dr. Frederico Hamman would finish the second part. Nurse thanked Janett Billow. Call ended.

## 2016-02-11 NOTE — Progress Notes (Signed)
   02/11/16 0909  OBSTRUCTIVE SLEEP APNEA  Have you ever been diagnosed with sleep apnea through a sleep study? No  Do you snore loudly (loud enough to be heard through closed doors)?  0  Do you often feel tired, fatigued, or sleepy during the daytime (such as falling asleep during driving or talking to someone)? 0  Has anyone observed you stop breathing during your sleep? 0  Do you have, or are you being treated for high blood pressure? 1  BMI more than 35 kg/m2? 1  Age > 50 (1-yes) 1  Neck circumference greater than:Male 16 inches or larger, Male 17inches or larger? 1  Male Gender (Yes=1) 1  Obstructive Sleep Apnea Score 5  Score 5 or greater  Results sent to PCP   This patient has screened at risk for sleep apnea using the STOP bang tool used during a pre-surgical visit. A score or 5 or greater is at risk for sleep apnea.

## 2016-02-14 ENCOUNTER — Encounter: Payer: Self-pay | Admitting: Registered Nurse

## 2016-02-14 ENCOUNTER — Encounter: Payer: Medicare PPO | Attending: Physical Medicine & Rehabilitation | Admitting: Registered Nurse

## 2016-02-14 VITALS — BP 133/76 | HR 67

## 2016-02-14 DIAGNOSIS — Z9889 Other specified postprocedural states: Secondary | ICD-10-CM

## 2016-02-14 DIAGNOSIS — S0451XS Injury of facial nerve, right side, sequela: Secondary | ICD-10-CM

## 2016-02-14 DIAGNOSIS — Z5181 Encounter for therapeutic drug level monitoring: Secondary | ICD-10-CM | POA: Diagnosis present

## 2016-02-14 DIAGNOSIS — G894 Chronic pain syndrome: Secondary | ICD-10-CM | POA: Diagnosis not present

## 2016-02-14 DIAGNOSIS — G510 Bell's palsy: Secondary | ICD-10-CM

## 2016-02-14 DIAGNOSIS — Z79899 Other long term (current) drug therapy: Secondary | ICD-10-CM | POA: Diagnosis present

## 2016-02-14 DIAGNOSIS — G939 Disorder of brain, unspecified: Secondary | ICD-10-CM

## 2016-02-14 DIAGNOSIS — D333 Benign neoplasm of cranial nerves: Secondary | ICD-10-CM

## 2016-02-14 DIAGNOSIS — R519 Headache, unspecified: Secondary | ICD-10-CM

## 2016-02-14 DIAGNOSIS — R51 Headache: Secondary | ICD-10-CM

## 2016-02-14 MED ORDER — HYDROCODONE-ACETAMINOPHEN 10-325 MG PO TABS: ORAL_TABLET | ORAL | Status: DC

## 2016-02-14 NOTE — Progress Notes (Signed)
Anesthesia Chart Review:  Pt is a 55 year old male scheduled for R lateral rectus resection superior rectus resection R eye on 02/16/2016 with [name].   PCP is Dr. Claris Gower.   PMH includes:  CAD (s/p CABG x1 2009), MI, ischemia cardiomyopathy, CHF, stroke, HTN, DM, brain tumor. Current smoker. BMI 53. S/p craniotomy/repair dural/central spinal fluid leak 11/20/12. S/p crainectomy 10/11/12.   Medications include: albuterol, atenolol, lasix, imdur  Preoperative labs reviewed.    EKG 11/30/15: sinus rhythm with poor R-wave progression and RAD.   Nuclear stress test 01/07/16:   The left ventricular ejection fraction is mildly decreased (45-54%).  Nuclear stress EF: 49%.  There was no ST segment deviation noted during stress.  The study is normal. Normal stress nuclear study with no ischemia or infarction; EF 49 but visually appears better; suggest echocardiogram to further assess; mild LVE.  Echo 01/07/16:  - Left ventricle: The cavity size was normal. There was severe concentric hypertrophy. Systolic function was normal. The estimated ejection fraction was in the range of 55% to 60%. Although no diagnostic regional wall motion abnormality was identified, this possibility cannot be completely excluded on the basis of this study. - Ventricular septum: The contour showed diastolic flattening. - Aortic valve: Trileaflet; mildly thickened, mildly calcified leaflets. - Left atrium: The atrium was mildly dilated.  Pt saw Quay Burow, MD with cardiology for pre-op evaluation. Stress test and echo as above.   If no changes, I anticipate pt can proceed with surgery as scheduled.   Willeen Cass, FNP-BC [facility]/Anesthesiology Phone: [phone] 02/14/2016 1:37 PM

## 2016-02-16 ENCOUNTER — Encounter (HOSPITAL_COMMUNITY)
Admission: AD | Disposition: A | Discharge status: Home / Self Care | Payer: Self-pay | Source: Ambulatory Visit | Attending: Internal Medicine

## 2016-02-16 ENCOUNTER — Ambulatory Visit (HOSPITAL_COMMUNITY): Payer: Medicare PPO | Admitting: Certified Registered Nurse Anesthetist

## 2016-02-16 ENCOUNTER — Encounter (HOSPITAL_COMMUNITY): Payer: Self-pay | Admitting: *Deleted

## 2016-02-16 ENCOUNTER — Inpatient Hospital Stay (HOSPITAL_COMMUNITY)
Admission: AD | Admit: 2016-02-16 | Discharge: 2016-02-23 | DRG: 987 | Disposition: A | Discharge status: Home / Self Care | Payer: Medicare PPO | Source: Ambulatory Visit | Attending: Internal Medicine | Admitting: Internal Medicine

## 2016-02-16 ENCOUNTER — Ambulatory Visit (HOSPITAL_COMMUNITY): Payer: Medicare PPO | Admitting: Emergency Medicine

## 2016-02-16 ENCOUNTER — Encounter: Payer: Self-pay | Admitting: Registered Nurse

## 2016-02-16 ENCOUNTER — Ambulatory Visit (HOSPITAL_COMMUNITY): Payer: Medicare PPO

## 2016-02-16 DIAGNOSIS — I252 Old myocardial infarction: Secondary | ICD-10-CM | POA: Diagnosis not present

## 2016-02-16 DIAGNOSIS — J440 Chronic obstructive pulmonary disease with acute lower respiratory infection: Secondary | ICD-10-CM | POA: Diagnosis present

## 2016-02-16 DIAGNOSIS — R609 Edema, unspecified: Secondary | ICD-10-CM | POA: Diagnosis not present

## 2016-02-16 DIAGNOSIS — F17200 Nicotine dependence, unspecified, uncomplicated: Secondary | ICD-10-CM | POA: Diagnosis present

## 2016-02-16 DIAGNOSIS — H5021 Vertical strabismus, right eye: Secondary | ICD-10-CM | POA: Diagnosis present

## 2016-02-16 DIAGNOSIS — R0602 Shortness of breath: Secondary | ICD-10-CM

## 2016-02-16 DIAGNOSIS — F17210 Nicotine dependence, cigarettes, uncomplicated: Secondary | ICD-10-CM | POA: Diagnosis present

## 2016-02-16 DIAGNOSIS — I4891 Unspecified atrial fibrillation: Secondary | ICD-10-CM | POA: Diagnosis not present

## 2016-02-16 DIAGNOSIS — J9621 Acute and chronic respiratory failure with hypoxia: Secondary | ICD-10-CM | POA: Diagnosis present

## 2016-02-16 DIAGNOSIS — J9622 Acute and chronic respiratory failure with hypercapnia: Secondary | ICD-10-CM | POA: Diagnosis present

## 2016-02-16 DIAGNOSIS — I5033 Acute on chronic diastolic (congestive) heart failure: Secondary | ICD-10-CM | POA: Diagnosis not present

## 2016-02-16 DIAGNOSIS — E662 Morbid (severe) obesity with alveolar hypoventilation: Secondary | ICD-10-CM | POA: Diagnosis present

## 2016-02-16 DIAGNOSIS — G510 Bell's palsy: Secondary | ICD-10-CM | POA: Diagnosis present

## 2016-02-16 DIAGNOSIS — H532 Diplopia: Secondary | ICD-10-CM | POA: Diagnosis present

## 2016-02-16 DIAGNOSIS — H02401 Unspecified ptosis of right eyelid: Secondary | ICD-10-CM | POA: Diagnosis present

## 2016-02-16 DIAGNOSIS — R718 Other abnormality of red blood cells: Secondary | ICD-10-CM | POA: Diagnosis present

## 2016-02-16 DIAGNOSIS — R112 Nausea with vomiting, unspecified: Secondary | ICD-10-CM | POA: Diagnosis not present

## 2016-02-16 DIAGNOSIS — I69328 Other speech and language deficits following cerebral infarction: Secondary | ICD-10-CM | POA: Diagnosis not present

## 2016-02-16 DIAGNOSIS — E119 Type 2 diabetes mellitus without complications: Secondary | ICD-10-CM | POA: Diagnosis present

## 2016-02-16 DIAGNOSIS — Z951 Presence of aortocoronary bypass graft: Secondary | ICD-10-CM

## 2016-02-16 DIAGNOSIS — J189 Pneumonia, unspecified organism: Secondary | ICD-10-CM | POA: Diagnosis present

## 2016-02-16 DIAGNOSIS — I2510 Atherosclerotic heart disease of native coronary artery without angina pectoris: Secondary | ICD-10-CM | POA: Diagnosis present

## 2016-02-16 DIAGNOSIS — H5000 Unspecified esotropia: Secondary | ICD-10-CM | POA: Diagnosis present

## 2016-02-16 DIAGNOSIS — J441 Chronic obstructive pulmonary disease with (acute) exacerbation: Secondary | ICD-10-CM | POA: Diagnosis present

## 2016-02-16 DIAGNOSIS — H02102 Unspecified ectropion of right lower eyelid: Secondary | ICD-10-CM | POA: Diagnosis present

## 2016-02-16 DIAGNOSIS — I255 Ischemic cardiomyopathy: Secondary | ICD-10-CM | POA: Diagnosis present

## 2016-02-16 DIAGNOSIS — E669 Obesity, unspecified: Secondary | ICD-10-CM | POA: Diagnosis present

## 2016-02-16 DIAGNOSIS — H4921 Sixth [abducent] nerve palsy, right eye: Secondary | ICD-10-CM | POA: Diagnosis present

## 2016-02-16 DIAGNOSIS — J9600 Acute respiratory failure, unspecified whether with hypoxia or hypercapnia: Secondary | ICD-10-CM

## 2016-02-16 DIAGNOSIS — J9601 Acute respiratory failure with hypoxia: Secondary | ICD-10-CM | POA: Diagnosis not present

## 2016-02-16 DIAGNOSIS — D333 Benign neoplasm of cranial nerves: Secondary | ICD-10-CM | POA: Diagnosis present

## 2016-02-16 DIAGNOSIS — J9620 Acute and chronic respiratory failure, unspecified whether with hypoxia or hypercapnia: Secondary | ICD-10-CM

## 2016-02-16 DIAGNOSIS — I110 Hypertensive heart disease with heart failure: Secondary | ICD-10-CM | POA: Diagnosis present

## 2016-02-16 DIAGNOSIS — J9690 Respiratory failure, unspecified, unspecified whether with hypoxia or hypercapnia: Secondary | ICD-10-CM

## 2016-02-16 DIAGNOSIS — E876 Hypokalemia: Secondary | ICD-10-CM | POA: Diagnosis present

## 2016-02-16 DIAGNOSIS — G4733 Obstructive sleep apnea (adult) (pediatric): Secondary | ICD-10-CM | POA: Diagnosis not present

## 2016-02-16 DIAGNOSIS — J9602 Acute respiratory failure with hypercapnia: Secondary | ICD-10-CM | POA: Diagnosis not present

## 2016-02-16 DIAGNOSIS — Z6841 Body Mass Index (BMI) 40.0 and over, adult: Secondary | ICD-10-CM | POA: Diagnosis not present

## 2016-02-16 HISTORY — PX: MEDIAN RECTUS REPAIR: SHX5301

## 2016-02-16 LAB — CBC WITH DIFFERENTIAL/PLATELET
BASOS PCT: 1 %
Basophils Absolute: 0.1 10*3/uL (ref 0.0–0.1)
EOS ABS: 0.1 10*3/uL (ref 0.0–0.7)
EOS PCT: 1 %
HCT: 52.4 % — ABNORMAL HIGH (ref 39.0–52.0)
HEMOGLOBIN: 15.5 g/dL (ref 13.0–17.0)
Lymphocytes Relative: 12 %
Lymphs Abs: 1.2 10*3/uL (ref 0.7–4.0)
MCH: 31.3 pg (ref 26.0–34.0)
MCHC: 29.6 g/dL — AB (ref 30.0–36.0)
MCV: 105.6 fL — ABNORMAL HIGH (ref 78.0–100.0)
MONO ABS: 0.8 10*3/uL (ref 0.1–1.0)
MONOS PCT: 8 %
NEUTROS PCT: 78 %
Neutro Abs: 7.8 10*3/uL — ABNORMAL HIGH (ref 1.7–7.7)
PLATELETS: 183 10*3/uL (ref 150–400)
RBC: 4.96 MIL/uL (ref 4.22–5.81)
RDW: 13.5 % (ref 11.5–15.5)
WBC: 10 10*3/uL (ref 4.0–10.5)

## 2016-02-16 LAB — POCT I-STAT 3, ART BLOOD GAS (G3+)
Acid-base deficit: 1 mmol/L (ref 0.0–2.0)
BICARBONATE: 29.4 meq/L — AB (ref 20.0–24.0)
O2 Saturation: 92 %
PO2 ART: 78 mmHg — AB (ref 80.0–100.0)
TCO2: 32 mmol/L (ref 0–100)
pCO2 arterial: 73.3 mmHg (ref 35.0–45.0)
pH, Arterial: 7.212 — ABNORMAL LOW (ref 7.350–7.450)

## 2016-02-16 LAB — BLOOD GAS, ARTERIAL
Acid-Base Excess: 5.3 mmol/L — ABNORMAL HIGH (ref 0.0–2.0)
Bicarbonate: 32.1 meq/L — ABNORMAL HIGH (ref 20.0–24.0)
Drawn by: 257081
FIO2: 0.5
Mode: POSITIVE
O2 Saturation: 96.7 %
PEEP: 5 cmH2O
Patient temperature: 97.8
Pressure support: 5 cmH2O
TCO2: 34.4 mmol/L (ref 0–100)
pCO2 arterial: 73.8 mmHg (ref 35.0–45.0)
pH, Arterial: 7.258 — ABNORMAL LOW (ref 7.350–7.450)
pO2, Arterial: 97.8 mmHg (ref 80.0–100.0)

## 2016-02-16 LAB — COMPREHENSIVE METABOLIC PANEL
ALT: 17 U/L (ref 17–63)
AST: 18 U/L (ref 15–41)
Albumin: 3.1 g/dL — ABNORMAL LOW (ref 3.5–5.0)
Alkaline Phosphatase: 57 U/L (ref 38–126)
Anion gap: 5 (ref 5–15)
BILIRUBIN TOTAL: 0.6 mg/dL (ref 0.3–1.2)
BUN: 13 mg/dL (ref 6–20)
CO2: 29 mmol/L (ref 22–32)
Calcium: 7.9 mg/dL — ABNORMAL LOW (ref 8.9–10.3)
Chloride: 108 mmol/L (ref 101–111)
Creatinine, Ser: 1.04 mg/dL (ref 0.61–1.24)
GFR calc Af Amer: >60 mL/min (ref >60)
Glucose, Bld: 120 mg/dL — ABNORMAL HIGH (ref 65–99)
POTASSIUM: 3.6 mmol/L (ref 3.5–5.1)
Sodium: 142 mmol/L (ref 135–145)
TOTAL PROTEIN: 5.8 g/dL — AB (ref 6.5–8.1)

## 2016-02-16 LAB — GLUCOSE, CAPILLARY
Glucose-Capillary: 99 mg/dL (ref 65–99)
Glucose-Capillary: 123 mg/dL — ABNORMAL HIGH (ref 65–99)

## 2016-02-16 LAB — TROPONIN I: Troponin I: <0.03 ng/mL (ref <0.03)

## 2016-02-16 LAB — MRSA PCR SCREENING: MRSA by PCR: NEGATIVE

## 2016-02-16 LAB — PROCALCITONIN: Procalcitonin: <0.1 ng/mL

## 2016-02-16 LAB — APTT: aPTT: 34 seconds (ref 24–37)

## 2016-02-16 LAB — PROTIME-INR
INR: 1.17 (ref 0.00–1.49)
PROTHROMBIN TIME: 15.1 s (ref 11.6–15.2)

## 2016-02-16 LAB — LACTIC ACID, PLASMA: LACTIC ACID, VENOUS: 1.6 mmol/L (ref 0.5–1.9)

## 2016-02-16 LAB — D-DIMER, QUANTITATIVE: D-Dimer, Quant: 0.47 ug/mL-FEU (ref 0.00–0.50)

## 2016-02-16 LAB — BRAIN NATRIURETIC PEPTIDE: B Natriuretic Peptide: 85.4 pg/mL (ref 0.0–100.0)

## 2016-02-16 SURGERY — REPAIR, MUSCLE, MEDIAL RECTUS
Anesthesia: General | Site: Eye | Laterality: Right

## 2016-02-16 SURGERY — ADJUSTABLE SUTURE MANIPULATION
Anesthesia: General | Site: Eye | Laterality: Right

## 2016-02-16 MED ORDER — DOXYCYCLINE HYCLATE 100 MG IV SOLR
100.0000 mg | Freq: Two times a day (BID) | INTRAVENOUS | Status: DC
  Administered 2016-02-16 – 2016-02-17 (×2): 100 mg via INTRAVENOUS
  Filled 2016-02-16 (×3): qty 100

## 2016-02-16 MED ORDER — TOPIRAMATE 100 MG PO TABS
200.0000 mg | ORAL_TABLET | Freq: Every day | ORAL | Status: DC
  Administered 2016-02-16 – 2016-02-17 (×2): 200 mg via ORAL
  Administered 2016-02-18: 100 mg via ORAL
  Administered 2016-02-19 – 2016-02-22 (×4): 200 mg via ORAL
  Filled 2016-02-16 (×8): qty 2

## 2016-02-16 MED ORDER — TOBRAMYCIN-DEXAMETHASONE 0.3-0.1 % OP OINT
TOPICAL_OINTMENT | OPHTHALMIC | Status: AC
  Filled 2016-02-16: qty 3.5

## 2016-02-16 MED ORDER — TETRACAINE HCL 0.5 % OP SOLN
OPHTHALMIC | Status: AC
  Filled 2016-02-16: qty 2

## 2016-02-16 MED ORDER — LIDOCAINE 2% (20 MG/ML) 5 ML SYRINGE
INTRAMUSCULAR | Status: AC
  Filled 2016-02-16: qty 5

## 2016-02-16 MED ORDER — MIDAZOLAM HCL 2 MG/2ML IJ SOLN
INTRAMUSCULAR | Status: AC
  Filled 2016-02-16: qty 2

## 2016-02-16 MED ORDER — FENTANYL CITRATE (PF) 100 MCG/2ML IJ SOLN: 25.0000 ug | INTRAMUSCULAR | Status: DC | PRN

## 2016-02-16 MED ORDER — HYPROMELLOSE (GONIOSCOPIC) 2.5 % OP SOLN
OPHTHALMIC | Status: DC | PRN
  Administered 2016-02-16: 2 [drp] via OPHTHALMIC

## 2016-02-16 MED ORDER — PROPOFOL 10 MG/ML IV BOLUS
INTRAVENOUS | Status: DC | PRN
  Administered 2016-02-16: 130 mg via INTRAVENOUS

## 2016-02-16 MED ORDER — ATENOLOL 50 MG PO TABS
50.0000 mg | ORAL_TABLET | Freq: Two times a day (BID) | ORAL | Status: DC
  Filled 2016-02-16 (×2): qty 1

## 2016-02-16 MED ORDER — LIDOCAINE-EPINEPHRINE 1 %-1:100000 IJ SOLN
INTRAMUSCULAR | Status: DC | PRN
  Administered 2016-02-16: 2 mL

## 2016-02-16 MED ORDER — FENTANYL CITRATE (PF) 100 MCG/2ML IJ SOLN
INTRAMUSCULAR | Status: AC
  Filled 2016-02-16: qty 2

## 2016-02-16 MED ORDER — PHENYLEPHRINE HCL 2.5 % OP SOLN
OPHTHALMIC | Status: AC
  Filled 2016-02-16: qty 2

## 2016-02-16 MED ORDER — HYDROCODONE-ACETAMINOPHEN 10-325 MG PO TABS: 1.0000 | ORAL_TABLET | Freq: Four times a day (QID) | ORAL | Status: DC | PRN

## 2016-02-16 MED ORDER — PHENYLEPHRINE HCL 2.5 % OP SOLN
OPHTHALMIC | Status: DC | PRN
  Administered 2016-02-16: 5 [drp] via OPHTHALMIC

## 2016-02-16 MED ORDER — SODIUM CHLORIDE 0.9 % IV SOLN: 250.0000 mL | INTRAVENOUS | Status: DC | PRN

## 2016-02-16 MED ORDER — FENTANYL CITRATE (PF) 250 MCG/5ML IJ SOLN
INTRAMUSCULAR | Status: AC
  Filled 2016-02-16: qty 5

## 2016-02-16 MED ORDER — GLYCOPYRROLATE 0.2 MG/ML IJ SOLN
INTRAMUSCULAR | Status: DC | PRN
  Administered 2016-02-16: 0.4 mg via INTRAVENOUS

## 2016-02-16 MED ORDER — SUCCINYLCHOLINE CHLORIDE 20 MG/ML IJ SOLN
INTRAMUSCULAR | Status: DC | PRN
  Administered 2016-02-16: 100 mg via INTRAVENOUS

## 2016-02-16 MED ORDER — ISOSORBIDE MONONITRATE ER 30 MG PO TB24
30.0000 mg | ORAL_TABLET | Freq: Two times a day (BID) | ORAL | Status: DC
  Administered 2016-02-16: 30 mg via ORAL
  Filled 2016-02-16 (×2): qty 1

## 2016-02-16 MED ORDER — ONDANSETRON HCL 4 MG/2ML IJ SOLN
INTRAMUSCULAR | Status: DC | PRN
  Administered 2016-02-16: 4 mg via INTRAVENOUS

## 2016-02-16 MED ORDER — BSS IO SOLN
INTRAOCULAR | Status: AC
  Filled 2016-02-16: qty 15

## 2016-02-16 MED ORDER — PROPOFOL 10 MG/ML IV BOLUS
INTRAVENOUS | Status: AC
  Filled 2016-02-16: qty 20

## 2016-02-16 MED ORDER — FUROSEMIDE 20 MG PO TABS: 20.0000 mg | ORAL_TABLET | Freq: Every day | ORAL | Status: DC

## 2016-02-16 MED ORDER — ALBUTEROL SULFATE HFA 108 (90 BASE) MCG/ACT IN AERS
INHALATION_SPRAY | RESPIRATORY_TRACT | Status: DC | PRN
  Administered 2016-02-16: 2 via RESPIRATORY_TRACT

## 2016-02-16 MED ORDER — EPHEDRINE SULFATE 50 MG/ML IJ SOLN
INTRAMUSCULAR | Status: DC | PRN
  Administered 2016-02-16: 10 mg via INTRAVENOUS

## 2016-02-16 MED ORDER — TOBRAMYCIN-DEXAMETHASONE 0.3-0.1 % OP OINT: 1.0000 "application " | TOPICAL_OINTMENT | Freq: Two times a day (BID) | OPHTHALMIC | Status: DC

## 2016-02-16 MED ORDER — SODIUM CHLORIDE 0.9 % IV SOLN
INTRAVENOUS | Status: DC
  Administered 2016-02-17: 50 mL/h via INTRAVENOUS

## 2016-02-16 MED ORDER — TETRACAINE HCL 0.5 % OP SOLN
OPHTHALMIC | Status: DC | PRN
  Administered 2016-02-16: 2 [drp] via OPHTHALMIC

## 2016-02-16 MED ORDER — ALBUTEROL SULFATE (2.5 MG/3ML) 0.083% IN NEBU
3.0000 mL | INHALATION_SOLUTION | Freq: Four times a day (QID) | RESPIRATORY_TRACT | Status: DC | PRN
  Administered 2016-02-18: 3 mL via RESPIRATORY_TRACT
  Filled 2016-02-16: qty 3

## 2016-02-16 MED ORDER — BSS IO SOLN
INTRAOCULAR | Status: DC | PRN
  Administered 2016-02-16 (×2): 15 mL

## 2016-02-16 MED ORDER — HYPROMELLOSE (GONIOSCOPIC) 2.5 % OP SOLN
OPHTHALMIC | Status: AC
  Filled 2016-02-16: qty 15

## 2016-02-16 MED ORDER — LIDOCAINE HCL (CARDIAC) 20 MG/ML IV SOLN
INTRAVENOUS | Status: DC | PRN
  Administered 2016-02-16: 60 mg via INTRAVENOUS

## 2016-02-16 MED ORDER — LIDOCAINE-EPINEPHRINE 1 %-1:100000 IJ SOLN
INTRAMUSCULAR | Status: AC
  Filled 2016-02-16: qty 1

## 2016-02-16 MED ORDER — ARTIFICIAL TEARS OP OINT
1.0000 "application " | TOPICAL_OINTMENT | Freq: Every day | OPHTHALMIC | Status: DC
  Administered 2016-02-16 – 2016-02-22 (×7): 1 via OPHTHALMIC
  Filled 2016-02-16 (×3): qty 3.5

## 2016-02-16 MED ORDER — SODIUM CHLORIDE 0.9 % IV SOLN
INTRAVENOUS | Status: DC
  Administered 2016-02-16 (×2): via INTRAVENOUS

## 2016-02-16 MED ORDER — STERILE WATER FOR IRRIGATION IR SOLN
Status: DC | PRN
  Administered 2016-02-16: 1000 mL

## 2016-02-16 MED ORDER — FENTANYL CITRATE (PF) 100 MCG/2ML IJ SOLN
INTRAMUSCULAR | Status: DC | PRN
  Administered 2016-02-16: 50 ug via INTRAVENOUS
  Administered 2016-02-16: 100 ug via INTRAVENOUS

## 2016-02-16 MED ORDER — LACTATED RINGERS IV SOLN: INTRAVENOUS | Status: DC

## 2016-02-16 MED ORDER — 0.9 % SODIUM CHLORIDE (POUR BTL) OPTIME
TOPICAL | Status: DC | PRN
  Administered 2016-02-16: 1000 mL

## 2016-02-16 MED ORDER — FAMOTIDINE IN NACL 20-0.9 MG/50ML-% IV SOLN: 20.0000 mg | Freq: Two times a day (BID) | INTRAVENOUS | Status: DC

## 2016-02-16 SURGICAL SUPPLY — 31 items
APPLICATOR DR MATTHEWS STRL (MISCELLANEOUS) ×3 IMPLANT
BENZOIN TINCTURE PRP APPL 2/3 (GAUZE/BANDAGES/DRESSINGS) ×3 IMPLANT
BLADE SURG 15 STRL LF DISP TIS (BLADE) ×1 IMPLANT
BLADE SURG 15 STRL SS (BLADE) ×2
BNDG CONFORM 3 STRL LF (GAUZE/BANDAGES/DRESSINGS) IMPLANT
CAUTERY EYE LOW TEMP 1300F FIN (OPHTHALMIC RELATED) ×3 IMPLANT
CLOSURE WOUND 1/2 X4 (GAUZE/BANDAGES/DRESSINGS) ×1
CORDS BIPOLAR (ELECTRODE) ×3 IMPLANT
COVER MAYO STAND STRL (DRAPES) ×3 IMPLANT
COVER SURGICAL LIGHT HANDLE (MISCELLANEOUS) ×3 IMPLANT
CRADLE DONUT ADULT HEAD (MISCELLANEOUS) ×3 IMPLANT
DRAPE SURG 17X23 STRL (DRAPES) ×9 IMPLANT
GAUZE SPONGE 4X4 12PLY STRL (GAUZE/BANDAGES/DRESSINGS) ×9 IMPLANT
GLOVE BIO SURGEON STRL SZ 6.5 (GLOVE) ×2 IMPLANT
GLOVE BIO SURGEONS STRL SZ 6.5 (GLOVE) ×1
GLOVE SURG SIGNA 7.5 PF LTX (GLOVE) ×3 IMPLANT
GOWN STRL REUS W/ TWL LRG LVL3 (GOWN DISPOSABLE) ×2 IMPLANT
GOWN STRL REUS W/TWL LRG LVL3 (GOWN DISPOSABLE) ×4
KIT ROOM TURNOVER OR (KITS) ×3 IMPLANT
MARKER SKIN DUAL TIP RULER LAB (MISCELLANEOUS) ×3 IMPLANT
NEEDLE 27GAX1X1/2 (NEEDLE) ×3 IMPLANT
NS IRRIG 1000ML POUR BTL (IV SOLUTION) ×3 IMPLANT
PACK CATARACT CUSTOM (CUSTOM PROCEDURE TRAY) ×3 IMPLANT
PAD ARMBOARD 7.5X6 YLW CONV (MISCELLANEOUS) ×3 IMPLANT
STRIP CLOSURE SKIN 1/2X4 (GAUZE/BANDAGES/DRESSINGS) ×2 IMPLANT
SUT PROLENE 5 0 RB 1 DA (SUTURE) ×3 IMPLANT
SUT PROLENE 6 0 P 1 18 (SUTURE) ×3 IMPLANT
SUT VIC AB 5-0 TF 27 (SUTURE) ×3 IMPLANT
SUT VICRYL 6 0 S 29 12 (SUTURE) ×12 IMPLANT
TOWEL OR 17X24 6PK STRL BLUE (TOWEL DISPOSABLE) ×6 IMPLANT
WIPE INSTRUMENT VISIWIPE 73X73 (MISCELLANEOUS) ×3 IMPLANT

## 2016-02-16 NOTE — Progress Notes (Signed)
[place] Progress Note Patient Name: [name] DOB: 1960/11/15 MRN: [id number]   Date of Service  02/16/2016  HPI/Events of Note  Improved resp  eICU Interventions  Home oxy, dioet     Intervention Category Intermediate Interventions: Communication with other healthcare providers and/or family  Raylene Miyamoto. 02/16/2016, 8:21 PM

## 2016-02-16 NOTE — Progress Notes (Signed)
RT called to pts bedside to place pt on Bipap per MD order. Mask secure. Pt tol well.

## 2016-02-16 NOTE — Transfer of Care (Signed)
Immediate Anesthesia Transfer of Care Note  Patient: [name]  Procedure(s) Performed: Procedure(s): RIGHT LATERAL RECTUS RESECTION; SUPERIOR RECTUS RESECTION RIGHT EYE; RIGHT SUPERIOR RECTUS RECESSION; LATERAL TARSAL STRIP RIGHT LOWER EYELID (Right)  Patient Location: PACU  Anesthesia Type:General  Level of Consciousness: responds to stimulation and Patient remains intubated per anesthesia plan  Airway & Oxygen Therapy: Patient remains intubated per anesthesia plan  Post-op Assessment: Post -op Vital signs reviewed and stable  Post vital signs: Reviewed and stable  Last Vitals:  Filed Vitals:   02/16/16 0855  BP: 135/69  Pulse: 64  Temp: 36.5 C  Resp: 18    Last Pain:  Filed Vitals:   02/16/16 1349  PainSc: 8       Patients Stated Pain Goal: 4 ([id number])  Complications: No apparent anesthesia complications   Transported to PACU on monitors with bag mask ventilation 100%.  Placed on ventilator in PACU.  Peep5/FI02 50%/PS-15.  Responds to commands and follows direction.  ETC02 30.  Dr. Ola Spurr notified of arrival to PACU.  Blood gas ordered for 22min.  Report to RN.

## 2016-02-16 NOTE — Progress Notes (Signed)
Subjective:    Patient ID: [name], male    DOB: 24-Nov-1960, 55 y.o.   MRN: [id number]  HPI: [name] is a 55 year old male who returns for follow up for chronic pain and medication refill. He states his pain is located in his right eye. He rates his pain 7. His current exercise regime is walking and performing stretching exercises. He's using his cadillac walker. He's wearing right eye patch and keeps it lubricated.  He's scheduled for:  RIGHT LATERAL RECTUS RESECTION; SUPERIOR RECTUS RESECTION RIGHT EYE; RIGHT SUPERIOR RECTUS RECESSION; LATERAL TARSAL STRIP RIGHT LOWER EYELID with Dr. Frederico Hamman on 02/16/16.  Pain Inventory Average Pain 7 Pain Right Now 7 My pain is stabbing and tingling  In the last 24 hours, has pain interfered with the following? General activity 6 Relation with others 0 Enjoyment of life 2 What TIME of day is your pain at its worst? evening Sleep (in general) Fair  Pain is worse with: bending Pain improves with: rest and medication Relief from Meds: 5  Mobility walk with assistance use a walker how many minutes can you walk? 10 ability to climb steps?  no do you drive?  no needs help with transfers  Function disabled: date disabled . I need assistance with the following:  bathing, meal prep, household duties and shopping  Neuro/Psych numbness tingling trouble walking loss of taste or smell  Prior Studies Any changes since last visit?  no  Physicians involved in your care Any changes since last visit?  no   Family History  Problem Relation Age of Onset  . CAD Father    Social History   Social History  . Marital Status: Married    Spouse Name: N/A  . Number of Children: N/A  . Years of Education: N/A   Social History Main Topics  . Smoking status: Light Tobacco Smoker -- 0.25 packs/day for 20 years    Types: Cigarettes  . Smokeless tobacco: Current User     Comment: Rare tobacco.  "[name]"  .  Alcohol Use: No  . Drug Use: No  . Sexual Activity: Yes   Other Topics Concern  . None   Social History Narrative   Past Surgical History  Procedure Laterality Date  . Umbilical hernia repair    . Anal fistulectomy    . Retrosigmoid craniectomy for tumor resection Right 10/11/2012    Procedure: RETROSIGMOID CRANIECTOMY FOR TUMOR RESECTION;  Surgeon: [name], MD;  Location: [facility];  Service: Neurosurgery;  Laterality: Right;  Craniotomy for acoustic neuroma  . Tracheostomy tube placement N/A 10/11/2012    Procedure: TRACHEOSTOMY;  Surgeon: Ascencion Dike, MD;  Location: [facility];  Service: ENT;  Laterality: N/A;  . Acoustic neuroma resection N/A 10/11/2012    Procedure: ACOUSTIC NEUROMA RESECTION;  Surgeon: Ascencion Dike, MD;  Location: [place] [facility];  Service: ENT;  Laterality: N/A;  . Craniotomy Right 11/20/2012    Procedure: CRANIOTOMY REPAIR DURAL/CENTRAL SPINAL FLUID LEAK;  Surgeon: [name], MD;  Location: [place] [facility];  Service: Neurosurgery;  Laterality: Right;  . Placement of lumbar drain N/A 11/20/2012    Procedure: Attempted PLACEMENT OF LUMBAR DRAIN;  Surgeon: [name], MD;  Location: [place] [facility];  Service: Neurosurgery;  Laterality: N/A;  . Coronary artery bypass graft      LIMA to LAD 2009   Past Medical History  Diagnosis Date  . CAD (coronary  artery disease)     Cath 09, 99% LAD  . Cardiomyopathy, ischemic     Ischemic  . Obesity   . Fibromyalgia   . Shortness of breath   . CHF (congestive heart failure) ([place])   . Hypertension     dr Percival Spanish  . Cyst near coccyx   . Diabetes mellitus without complication (HCC)     borderline  . Cyst near tailbone   . Kidney stones   . Myocardial infarction ([place])   . Headache(784.0)     with lights  . Edema of both legs     Takes Lasix  . Stroke [facility])     Affected vision, walking, and facial drooping on right face  . Pneumonia     hx of  . Gout   . Bruises easily   . Brain tumor ([place])     Takes  Topamax so patient will not have headaches   BP 133/76 mmHg  Pulse 67  SpO2 91%  Opioid Risk Score:   Fall Risk Score:  `1  Depression screen PHQ 2/9  Depression screen PHQ 2/9 02/15/2015  Decreased Interest 0  Down, Depressed, Hopeless 0  PHQ - 2 Score 0  Altered sleeping 0  Tired, decreased energy 0  Change in appetite 0  Feeling bad or failure about yourself  0  Trouble concentrating 0  Moving slowly or fidgety/restless 0  Suicidal thoughts 0  PHQ-9 Score 0     Review of Systems  Constitutional: Negative.  Negative for fever.  HENT: Negative.   Eyes: Negative.   Respiratory: Positive for cough, shortness of breath and wheezing.   Cardiovascular: Positive for leg swelling.  Gastrointestinal: Positive for constipation.  Endocrine:       Low blood sugar   Genitourinary: Negative.   Musculoskeletal: Negative.   Skin: Negative.   Allergic/Immunologic: Negative.   Neurological: Positive for numbness.       Tingling   Hematological: Bruises/bleeds easily.  Psychiatric/Behavioral: Negative.        Objective:   Physical Exam  Constitutional: He is oriented to person, place, and time. He appears well-developed and well-nourished.  HENT:  Head: Normocephalic and atraumatic.  Neck: Normal range of motion. Neck supple.  Cardiovascular: Normal rate and regular rhythm.   Pulmonary/Chest: Effort normal and breath sounds normal.  Musculoskeletal:  Normal Muscle Bulk and Muscle Testing Reveals: Upper Extremities: Full ROM and Muscle Strength 5/5 Lower Extremities: Full ROM and Muscle Strength 5/5 Arises from table with ease using walker for support Narrow Based Gait   Neurological: He is alert and oriented to person, place, and time.  Skin: Skin is warm and dry.  Psychiatric: He has a normal mood and affect.  Nursing note and vitals reviewed.         Assessment & Plan:  1. 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 Refilled: Hydrocodone 10/325mg  one tablet every 6 hours as needed #120. Second script given for the following month. Continue eye gtts, ointment and patch.  Scheduled for eye surgery on 02/16/16 with Dr. Frederico Hamman 2. Headaches right temporal area related to tumor/surgery. No complaints voiced today. Continue Topamax  3. Lower extremity edema, morbid obesity. Continue to Monitor. PCP Following.  4. Muscle Spasm: Continue Tizanidine. 5. Peripheral Neuropathy: Continue to Monitor  20 minutes of face to face patient care time was spent during this visit. All questions were encouraged and answered.   F/U in 2 months

## 2016-02-16 NOTE — Interval H&P Note (Signed)
History and Physical Interval Note:  02/16/2016 9:56 AM  [name]  has presented today for surgery, with the diagnosis of ESOTROPIA DIPLOPIA ECTROPION RIGHT EYE   The various methods of treatment have been discussed with the patient and family. After consideration of risks, benefits and other options for treatment, the patient has consented to  Procedure(s): RIGHT LATERAL RECTUS RESECTION SUPERIOR RECTUS RESECTION RIGHT EYE  (Right) as a surgical intervention .  The patient's history has been reviewed, patient examined, no change in status, stable for surgery.  I have reviewed the patient's chart and labs.  Questions were answered to the patient's satisfaction.     Johnye Kist A

## 2016-02-16 NOTE — Progress Notes (Signed)
RT called to set up vent in PACU 06. Vent set up was done by RT and placed in standby. RT got called away - vent was left in standby due to patient not in PACU 06. RT was also not notified of pt on the way.

## 2016-02-16 NOTE — Significant Event (Signed)
Patient yelling at staff and family, wanting to take his home medications right at this time. Patient's spouse has several pills in a ziplock bag along with an inhaler and was about give to patient. RN requested that family not give patient's his medications at this time and provider will be notified regarding his home medications.

## 2016-02-16 NOTE — Anesthesia Postprocedure Evaluation (Signed)
Anesthesia Post Note  Patient: [name]  Procedure(s) Performed: Procedure(s) (LRB): RIGHT LATERAL RECTUS RESECTION; SUPERIOR RECTUS RESECTION RIGHT EYE; RIGHT SUPERIOR RECTUS RECESSION; LATERAL TARSAL STRIP RIGHT LOWER EYELID (Right)  Patient location during evaluation: PACU Anesthesia Type: General Level of consciousness: awake and alert Pain management: pain level controlled Vital Signs Assessment: post-procedure vital signs reviewed and stable Respiratory status: spontaneous breathing, nonlabored ventilation, patient connected to nasal cannula oxygen and respiratory function unstable Cardiovascular status: blood pressure returned to baseline and stable Postop Assessment: no signs of nausea or vomiting Anesthetic complications: no Comments: Pt extubated in PACU after following commands and pulling 1200cc tidal volumes on minimal support. Pt has required 4-6L [place] O2 to maintain sats since. CXR pending. Will place pt on Bipap and have consulted CCM to admit pt overnight.    Last Vitals:  Filed Vitals:   02/16/16 1532 02/16/16 1540  BP: 121/69   Pulse: 76 71  Temp:    Resp: 17 24    Last Pain:  Filed Vitals:   02/16/16 1634  PainSc: Asleep                 Kaden Dunkel,W. EDMOND

## 2016-02-16 NOTE — Progress Notes (Signed)
RT increased FIO2 to 6L [place] due to low sats. Pt tol well. Sats on 6L [place] 93%.

## 2016-02-16 NOTE — Progress Notes (Addendum)
RT assessment done. RT found pt extubated on 4L [place] with low sats.  BBS clear dim. No stridor. Pt was awake and following commands

## 2016-02-16 NOTE — Progress Notes (Addendum)
RT called and notified about an ABG order. RT was not notified of pt being placed on vent. RT was told CRNA placed pt on vent. RT was not notified of pt arrival to PACU or being placed on the vent.  Lead RT notified.

## 2016-02-16 NOTE — Progress Notes (Signed)
[facility] Progress Note Patient Name: [name] DOB: 03-28-1961 MRN: [id number]   Date of Service  02/16/2016  HPI/Events of Note  abg slight worse  eICU Interventions  Back to BIPAP now and less time off ratio 4 : 1 hrs     Intervention Category Major Interventions: Acid-Base disturbance - evaluation and management  Raylene Miyamoto. 02/16/2016, 10:54 PM

## 2016-02-16 NOTE — Anesthesia Procedure Notes (Signed)
Procedure Name: Intubation Date/Time: 02/16/2016 10:29 AM Performed by: Merrilyn Puma B Pre-anesthesia Checklist: Patient identified, Emergency Drugs available, Suction available and Patient being monitored Patient Re-evaluated:Patient Re-evaluated prior to inductionOxygen Delivery Method: Circle System Utilized Preoxygenation: Pre-oxygenation with 100% oxygen Intubation Type: IV induction Ventilation: Mask ventilation without difficulty, Two handed mask ventilation required and Oral airway inserted - appropriate to patient size Laryngoscope Size: Mac and 3 Grade View: Grade I Tube type: Oral Tube size: 8.0 mm Number of attempts: 1 Airway Equipment and Method: Stylet and Oral airway Placement Confirmation: ETT inserted through vocal cords under direct vision,  positive ETCO2 and breath sounds checked- equal and bilateral Secured at: 22 cm Tube secured with: Tape Dental Injury: Teeth and Oropharynx as per pre-operative assessment

## 2016-02-16 NOTE — Progress Notes (Signed)
Called to PACU to obtain ABG. When I arrived, CRNA had placed patient on CPAP/PS 15/5 and wanted to wait on ABG until weaned further. I placed patient on 5/5 and he looks great. Able to follow commmands, and take VT over 900. Will obtain ABG in 15-20 minutes. RT to monitor as needed.

## 2016-02-16 NOTE — Progress Notes (Signed)
RT spoke with Dr Titus Mould with [facility] - per MD wants pt on Bipap 12/5 4hrs ON and 2hrs OFF.

## 2016-02-16 NOTE — Evaluation (Signed)
Discussed pt's post op status with the  PACU nurse :  Re concerns about pt;s abnormal ABG post op and low oxygen saturation. We are agreed per anesthesia to admit pt for overnight observation in the critical care unit.

## 2016-02-16 NOTE — Progress Notes (Signed)
eLink Physician-Brief Progress Note Patient Name: [name] DOB: 28-Apr-1961 MRN: [id number]   Date of Service  02/16/2016  HPI/Events of Note  Camera in . Pt cursing at RN, pissed off, no distress pcxr atx rt base vs infiltrate asp? Acute on chronic hypercarbia Good mental status  eICU Interventions  bipap on opff, abg repeat i am speaking to him to be nice to care providers and counsel him on not using the 4 letter words Addressing his home meds Needs repeat abg     Intervention Category Major Interventions: Acid-Base disturbance - evaluation and management Evaluation Type: New Patient Evaluation  Raylene Miyamoto. 02/16/2016, 7:04 PM

## 2016-02-16 NOTE — Significant Event (Addendum)
Accepted patient from PACU at 1815pm. Patient is awake, upset and yelling at staff that he is suppose to be going home and not be here. Nursing staff explained to patient the situations that require him to be admitted to ICU. Patient said he does not care. Patient is alert and oriented X 3, disoriented to time, arrived to SICU on 4L [place] with a PIV via right hand with NS @ 50cc/hour. Right eye with scant bloody drainage and edema-site care and postop care done for right eye.  Patient refused for staff to assess his sacrum for skin breakdown. Patient not willing to lay down in bed for RN to assess sacrum thoroughly. RN placed foam dressing on sacrum.   Staff ordered bariatric bed for patient. Bladder scan done, showed 230 in bladder. Patient oriented to unit and room. Bed alarm on. Patient family updated at bedside.

## 2016-02-16 NOTE — Brief Op Note (Signed)
02/16/2016  5:48 PM  PATIENT:  [name]  55 y.o. male  PRE-OPERATIVE DIAGNOSIS:  ESOTROPIA DIPLOPIA ECTROPION RIGHT EYE   POST-OPERATIVE DIAGNOSIS:  ESOTROPIA DIPLOPIA ECTROPION RIGHT EYE   PROCEDURE:  Procedure(s): RIGHT LATERAL RECTUS RESECTION; SUPERIOR RECTUS RESECTION RIGHT EYE; RIGHT SUPERIOR RECTUS RECESSION; LATERAL TARSAL STRIP RIGHT LOWER EYELID (Right)  SURGEON:  Surgeon(s) and Role:    * [name], MD - Primary  PHYSICIAN ASSISTANT:   ASSISTANTS: none   ANESTHESIA:   general  EBL:  Total I/O In: 700 [I.V.:700] Out: 5 [Blood:5]  BLOOD ADMINISTERED:none  DRAINS: none   LOCAL MEDICATIONS USED:  LIDOCAINE   SPECIMEN:  No Specimen  DISPOSITION OF SPECIMEN:  N/A  COUNTS:  YES  TOURNIQUET:  * No tourniquets in log *  DICTATION: .Other Dictation: Dictation Number [id number]  PLAN OF CARE: Admit for overnight observation  PATIENT DISPOSITION:  PACU - hemodynamically stable.   Delay start of Pharmacological VTE agent (>24hrs) due to surgical blood loss or risk of bleeding: no

## 2016-02-16 NOTE — Interval H&P Note (Signed)
History and Physical Interval Note:  02/16/2016 1:18 PM  [name]  has presented today for surgery, with the diagnosis of [place]   The various methods of treatment have been discussed with the patient and family. After consideration of risks, benefits and other options for treatment, the patient has consented to  Procedure(s): ADJUSTABLE SUTURE RIGHT LATERAL RECTUS RIGHT SUPERIOR RECTUS LATERAL TARSAL STRIP RIGHT EYE  (Right) as a surgical intervention .  The patient's history has been reviewed, patient examined, no change in status, stable for surgery.  I have reviewed the patient's chart and labs.  Questions were answered to the patient's satisfaction.     Corri Delapaz A

## 2016-02-16 NOTE — Interval H&P Note (Signed)
History and Physical Interval Note:  02/16/2016 1:18 PM  [name]  has presented today for surgery, with the diagnosis of [place]   The various methods of treatment have been discussed with the patient and family. After consideration of risks, benefits and other options for treatment, the patient has consented to  Procedure(s): ADJUSTABLE SUTURE RIGHT LATERAL RECTUS RIGHT SUPERIOR RECTUS LATERAL TARSAL STRIP RIGHT EYE  (Right) as a surgical intervention .  The patient's history has been reviewed, patient examined, no change in status, stable for surgery.  I have reviewed the patient's chart and labs.  Questions were answered to the patient's satisfaction.     [name] A

## 2016-02-16 NOTE — H&P (View-Only) (Signed)
[name] is an 55 y.o. male.   Chief Complaint: Of ptosis at his right eyelid,diplopia,blurred vision,right lower lid ectropion.  HPI: residual esotropia,ptosis of right eyelid,double vision.  Past Medical History  Diagnosis Date  . CAD (coronary artery disease)     Cath 09, 99% LAD  . Cardiomyopathy, ischemic     Ischemic  . Obesity   . Fibromyalgia   . Shortness of breath   . CHF (congestive heart failure) ([place])   . Hypertension     dr Percival Spanish  . Cyst near coccyx   . Diabetes mellitus without complication (HCC)     borderline  . Cyst near tailbone   . Kidney stones   . Myocardial infarction ([place])   . Headache(784.0)     with lights    Past Surgical History  Procedure Laterality Date  . Coronary artery bypass graft      LIMA to LAD 2009  . Umbilical hernia repair    . Anal fistulectomy    . Retrosigmoid craniectomy for tumor resection Right 10/11/2012    Procedure: RETROSIGMOID CRANIECTOMY FOR TUMOR RESECTION;  Surgeon: [name], MD;  Location: [place] [facility]S;  Service: Neurosurgery;  Laterality: Right;  Craniotomy for acoustic neuroma  . Tracheostomy tube placement N/A 10/11/2012    Procedure: TRACHEOSTOMY;  Surgeon: Ascencion Dike, MD;  Location: [facility];  Service: ENT;  Laterality: N/A;  . Acoustic neuroma resection N/A 10/11/2012    Procedure: ACOUSTIC NEUROMA RESECTION;  Surgeon: Ascencion Dike, MD;  Location: [place] [facility] ORS;  Service: ENT;  Laterality: N/A;  . Craniotomy Right 11/20/2012    Procedure: CRANIOTOMY REPAIR DURAL/CENTRAL SPINAL FLUID LEAK;  Surgeon: [name], MD;  Location: [place] NEUR[facility] ORS;  Service: Neurosurgery;  Laterality: Right;  . Placement of lumbar drain N/A 11/20/2012    Procedure: Attempted PLACEMENT OF LUMBAR DRAIN;  Surgeon: [name], MD;  Location: [place] NEURO ORS;  Service: Neurosurgery;  Laterality: N/A;    Family History  Problem Relation Age of Onset  . CAD Father    Social History:  reports that he has been smoking  Cigarettes.  He has a 10 pack-year smoking history. He uses smokeless tobacco. He reports that he does not drink alcohol or use illicit drugs.  Allergies:  Allergies  Allergen Reactions  . Morphine And Related Anaphylaxis    "makes me stop breathing"  . Ivp Dye [Iodinated Diagnostic Agents]     Passing out  . Other     Steroids makes hearts race    No prescriptions prior to admission    No results found for this or any previous visit (from the past 78 hour(s)). No results found.  Review of Systems  HENT: Negative.   Eyes: Positive for blurred vision and double vision.       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  Cardiovascular:       *History of CVA (2004)  Gastrointestinal: Negative.   Genitourinary: Negative.   Musculoskeletal: Negative.   Skin: Negative.   Neurological: Negative.   Endo/Heme/Allergies: Negative.   Psychiatric/Behavioral: Negative.     There were no vitals taken for this visit. Physical Exam  Eyes:       Assessment/Plan Patient presents for right lateral rectus resection,superior rectus recession right eye adjustable sutures right eye,right lateral tarsal strip. For correction of esotropia,diplopia and ectropion of the right eye.  Dara Hoyer, MD 01/26/2016, 8:44 AM

## 2016-02-16 NOTE — Anesthesia Preprocedure Evaluation (Addendum)
Anesthesia Evaluation  Patient identified by MRN, date of birth, ID band Patient awake    Reviewed: Allergy & Precautions, H&P , NPO status , Patient's Chart, lab work & pertinent test results  Airway Mallampati: III  TM Distance: >3 FB Neck ROM: Full    Dental no notable dental hx. (+) Poor Dentition, Dental Advisory Given   Pulmonary Current Smoker,    Pulmonary exam normal breath sounds clear to auscultation       Cardiovascular hypertension, Pt. on medications and Pt. on home beta blockers + CAD, + Past MI and +CHF   Rhythm:Regular Rate:Normal     Neuro/Psych  Headaches, CVA negative psych ROS   GI/Hepatic negative GI ROS, Neg liver ROS,   Endo/Other  diabetesMorbid obesity  Renal/GU negative Renal ROS  negative genitourinary   Musculoskeletal  (+) Fibromyalgia -, narcotic dependent  Abdominal   Peds  Hematology negative hematology ROS (+)   Anesthesia Other Findings   Reproductive/Obstetrics negative OB ROS                            Anesthesia Physical Anesthesia Plan  ASA: III  Anesthesia Plan: General   Post-op Pain Management:    Induction: Intravenous  Airway Management Planned: Oral ETT  Additional Equipment:   Intra-op Plan:   Post-operative Plan: Extubation in OR  Informed Consent: I have reviewed the patients History and Physical, chart, labs and discussed the procedure including the risks, benefits and alternatives for the proposed anesthesia with the patient or authorized representative who has indicated his/her understanding and acceptance.   Dental advisory given  Plan Discussed with: CRNA  Anesthesia Plan Comments:         Anesthesia Quick Evaluation

## 2016-02-16 NOTE — H&P (Addendum)
PULMONARY / CRITICAL CARE MEDICINE   Name: [name] MRN: [id number] DOB: [birth year]    ADMISSION DATE:  02/16/2016 CONSULTATION DATE:  02/16/2016  REFERRING MD:  Oren Bracket, M.D. / Anesthesia  CHIEF COMPLAINT:  Acute Respiratory Failure  HISTORY OF PRESENT ILLNESS:  Patient underwent right eye surgery today and intubation with 8.0 endotracheal tube. Patient was extubated in the post anesthesia care unit reportedly pulling 1200 mL tidal volumes with minimal support. Patient has persistently required oxygen at 4-6 L/m by nasal cannula to maintain saturations. PCCM was consulted by anesthesia for admission. Patient reports prior to admission he had baseline cough which was productive of a clear to yellow phlegm. Denies any subjective fever, chills, or sweats. Denies any recent sick contacts. Denies any increased dyspnea. Denies any sore throat or sinus congestion. At present the patient denies any headache. He denies any nausea or vomiting. He denies any chest pain, pressure, or tightness. He does feel as though he was breathing more comfortably on nasal cannula oxygen before BiPAP.  PAST MEDICAL HISTORY :  Past Medical History  Diagnosis Date  . CAD (coronary artery disease)     Cath 09, 99% LAD  . Cardiomyopathy, ischemic     Ischemic  . Obesity   . Fibromyalgia   . Shortness of breath   . CHF (congestive heart failure) ([place])   . Hypertension     dr Percival Spanish  . Cyst near coccyx   . Diabetes mellitus without complication (HCC)     borderline  . Cyst near tailbone   . Kidney stones   . Myocardial infarction ([place])   . Headache(784.0)     with lights  . Edema of both legs     Takes Lasix  . Stroke [facility])     Affected vision, walking, and facial drooping on right face  . Pneumonia     hx of  . Gout   . Bruises easily   . Brain tumor ([place])     Takes Topamax so patient will not have headaches    PAST SURGICAL HISTORY: Past Surgical History  Procedure Laterality  Date  . Umbilical hernia repair    . Anal fistulectomy    . Retrosigmoid craniectomy for tumor resection Right 10/11/2012    Procedure: RETROSIGMOID CRANIECTOMY FOR TUMOR RESECTION;  Surgeon: [name], MD;  Location: [place] NEURO ORS;  Service: Neurosurgery;  Laterality: Right;  Craniotomy for acoustic neuroma  . Tracheostomy tube placement N/A 10/11/2012    Procedure: TRACHEOSTOMY;  Surgeon: Ascencion Dike, MD;  Location: [facility]URO [facility];  Service: ENT;  Laterality: N/A;  . Acoustic neuroma resection N/A 10/11/2012    Procedure: ACOUSTIC NEUROMA RESECTION;  Surgeon: Ascencion Dike, MD;  Location: [place] NEURO ORS;  Service: ENT;  Laterality: N/A;  . Craniotomy Right 11/20/2012    Procedure: CRANIOTOMY REPAIR DURAL/CENTRAL SPINAL FLUID LEAK;  Surgeon: [name], MD;  Location: [place] NEURO ORS;  Service: Neurosurgery;  Laterality: Right;  . Placement of lumbar drain N/A 11/20/2012    Procedure: Attempted PLACEMENT OF LUMBAR DRAIN;  Surgeon: [name], MD;  Location: [place] NEURO ORS;  Service: Neurosurgery;  Laterality: N/A;  . Coronary artery bypass graft      LIMA to LAD 2009     Allergies  Allergen Reactions  . Morphine And Related Anaphylaxis    "makes me stop breathing"  . Ivp Dye [Iodinated Diagnostic Agents]     Passing out  . Other  Steroids makes hearts race    No current facility-administered medications on file prior to encounter.   Current Outpatient Prescriptions on File Prior to Encounter  Medication Sig  . albuterol (PROVENTIL HFA;VENTOLIN HFA) 108 (90 BASE) MCG/ACT inhaler Inhale 2 puffs into the lungs every 6 (six) hours as needed for wheezing or shortness of breath.  Marland Kitchen artificial tears (LACRILUBE) OINT ophthalmic ointment Place 1 application into the right eye at bedtime.   Marland Kitchen atenolol (TENORMIN) 50 MG tablet Take 1 tablet (50 mg total) by mouth 2 (two) times daily. Note increased dose.  . colchicine 0.6 MG tablet Take 1 tablet (0.6 mg total) by mouth daily. (Patient taking  differently: Take 0.6 mg by mouth daily as needed. )  . furosemide (LASIX) 40 MG tablet Take 20 mg by mouth daily.   Marland Kitchen nystatin (MYCOSTATIN/NYSTOP) 100000 UNIT/GM POWD Apply 1 g topically 2 (two) times daily as needed. 90 day supply  . Skin Protectants, Misc. (EUCERIN) cream Apply 1 application topically 3 (three) times daily.  Marland Kitchen tiZANidine (ZANAFLEX) 4 MG tablet Take 1 tablet (4 mg total) by mouth 3 (three) times daily as needed for muscle spasms. 90 day supply  . topiramate (TOPAMAX) 100 MG tablet Take 2 tablets (200 mg total) by mouth at bedtime. 3 month rx (Patient taking differently: Take 100 mg by mouth at bedtime. 3 month rx)  . antiseptic oral rinse (BIOTENE) LIQD 15 mLs by Mouth Rinse route as needed for dry mouth.   . benzocaine (ORAJEL) 10 % mucosal gel Use as directed 1 application in the mouth or throat 2 (two) times daily as needed for pain.    FAMILY HISTORY:  Family History  Problem Relation Age of Onset  . CAD Father   . Hypertension Mother   . Cancer Other     multiple relatives    SOCIAL HISTORY: Social History  Substance Use Topics  . Smoking status: Light Tobacco Smoker -- 0.25 packs/day for 20 years    Types: Cigarettes  . Smokeless tobacco: Current User     Comment: Rare tobacco.  "[name]".  . Alcohol Use: No    REVIEW OF SYSTEMS:  Denies any new rashes or bruising. Denies any dysuria or hematuria. A pertinent 14 point review of systems is negative except as per the history of presenting illness.  SUBJECTIVE: As above.  VITAL SIGNS: BP 134/58 mmHg  Pulse 61  Temp(Src) 98.9 F (37.2 C) (Oral)  Resp 20  Ht 5\' 10"  (1.778 m)  Wt 166.515 kg (367 lb 1.6 oz)  BMI 52.67 kg/m2  SpO2 90%  HEMODYNAMICS:    VENTILATOR SETTINGS: Vent Mode:  [-] CPAP;PSV FiO2 (%):  [35 %-50 %] 35 % PEEP:  [5 cmH20] 5 cmH20 Pressure Support:  [5 cmH20] 5 cmH20  INTAKE / OUTPUT:    PHYSICAL EXAMINATION: General:  Awake. Alert. No acute distress. Obese.   Integument:  Warm & dry. No rash on exposed skin. Bruising around right eye. Lymphatics:  No appreciated cervical or supraclavicular lymphadenoapthy. HEENT:  No scleral icterus. BiPAP mask in place. Cardiovascular:  Regular rate. No edema. No appreciable JVD given body habitus.  Pulmonary:  Mild crackles bilateral lung bases. Normal work of breathing on BiPAP. Speaking in complete sentences. Abdomen: Soft. Normal bowel sounds. Protuberant. Grossly nontender. Musculoskeletal:  Normal bulk and tone. Hand grip strength 5/5 bilaterally. No joint deformity or effusion appreciated. Neurological:  No meningismus. Moving all 4 extremities equally. Grossly nonfocal. Psychiatric:  Mood and affect  congruent. Speech normal rhythm, rate & tone. Oriented 3.  LABS:  BMET  Recent Labs Lab 02/11/16 0920  NA 141  K 4.3  CL 107  CO2 27  BUN 14  CREATININE 1.19  GLUCOSE 128*    Electrolytes  Recent Labs Lab 02/11/16 0920  CALCIUM 9.3    CBC  Recent Labs Lab 02/11/16 0920  WBC 9.2  HGB 16.9  HCT 56.1*  PLT 185    Coag's No results for input(s): APTT, INR in the last 168 hours.  Sepsis Markers No results for input(s): LATICACIDVEN, PROCALCITON, O2SATVEN in the last 168 hours.  ABG  Recent Labs Lab 02/16/16 1455  PHART 7.258*  PCO2ART 73.8*  PO2ART 97.8    Liver Enzymes No results for input(s): AST, ALT, ALKPHOS, BILITOT, ALBUMIN in the last 168 hours.  Cardiac Enzymes No results for input(s): TROPONINI, PROBNP in the last 168 hours.  Glucose  Recent Labs Lab 02/16/16 1442  GLUCAP 99    Imaging Dg Chest Port 1 View  02/16/2016  CLINICAL DATA:  Shortness of Breath EXAM: PORTABLE CHEST 1 VIEW COMPARISON:  11/20/2012 FINDINGS: Cardiomegaly again noted. Central mild vascular congestion without convincing pulmonary edema. Status post median sternotomy. There is streaky atelectasis or infiltrate in left midlung and right base. IMPRESSION: Cardiomegaly. Status post  median sternotomy. Streaky atelectasis or infiltrate left midlung and right base. Electronically Signed   By: Lahoma Crocker M.D.   On: 02/16/2016 16:56     STUDIES:  Port CXR 6/28: New right lower lung opacity. EKG 6/28>>  MICROBIOLOGY: MRSA PCR 6/28>>> Blood Ctx x2 6/28>>> Sputum Ctx 6/28>>> Urine Strep Ag 6/28>>> Urine Legionella Ag 6/28>>>  ANTIBIOTICS: Doxycycline 6/28>>>  SIGNIFICANT EVENTS: 6/28 - Presented for eye surgery 6/28 - Admitted from PACU with worsening respiratory status  LINES/TUBES: PIV x1  ASSESSMENT / PLAN:  PULMONARY A: Acute Hypoxic Respiratory Failure  Acute on Chronic Hypercarbic Respiratory Failure - Possible OSA/OHS. CAP Chronic Tobacco Use  P:   Wean FiO2 for Sat >92% Albuterol prn BiPAP prn increased work of breathing Outpatient PSG  CARDIOVASCULAR A:  H/O HTN H/O CAD - S/P CABG  P:  Monitoring on telemetry Vitals per unit protocol EKG pending Troponin I q6hr x3 Home Atenolol, Imdur, & Lasix  RENAL A:   No acute issues.  P:   Trending UOP Monitoring renal function & electrolytes daily Replacing electrolytes as indicated  GASTROINTESTINAL A:   No acute issues.  P:   NPO Sips with Meds & Ice Chips  HEMATOLOGIC A:   No acute issues.  P:  Trending cell counts daily w/ CBC SCDs Holding chemical DVT ppx given recent surgery  INFECTIOUS A:   CAP  P:   Empiric Doxycycline Sputum & Blood cultures Urine Legionella & Strep Antigens Trending PCT per algorithm  ENDOCRINE A:   H/O Borderline DM  P:   Accu-Checks q4hr with MD notification parameters  NEUROLOGIC A:   H/O Headache H/O CVA H/O Brain Tumor - S/P Excision.  P:   Home Topamax Monitor closely in ICU   FAMILY  - Updates: No family at bedside.  - Inter-disciplinary family meet or Palliative Care meeting due by:  7/5  TODAY'S SUMMARY:  55 year old male with acute hypoxic respiratory failure postsurgery today. Patient also has hypercarbic  respiratory failure. Initiating treatment for community acquired pneumonia given new opacity on x-ray imaging. Close monitoring in the intensive care unit with observation for mental status changes as well as intermittent BiPAP for  increased work of breathing. Weaning supplemental oxygen support. Initiating empiric doxycycline while performing infectious workup.  I have spent a total of 32 minutes of critical care time today caring for the patient and reviewing the patient's electronic medical record.  Sonia Baller Ashok Cordia, M.D. [facility] Critical [facility] Pager:  [phone] After 3pm or if no response, call [phone] 02/16/2016, 6:05 PM

## 2016-02-17 ENCOUNTER — Inpatient Hospital Stay (HOSPITAL_COMMUNITY): Payer: Medicare PPO

## 2016-02-17 ENCOUNTER — Encounter (HOSPITAL_COMMUNITY): Payer: Self-pay | Admitting: Ophthalmology

## 2016-02-17 DIAGNOSIS — J9622 Acute and chronic respiratory failure with hypercapnia: Secondary | ICD-10-CM

## 2016-02-17 LAB — URINE MICROSCOPIC-ADD ON

## 2016-02-17 LAB — GLUCOSE, CAPILLARY
GLUCOSE-CAPILLARY: 120 mg/dL — AB (ref 65–99)
GLUCOSE-CAPILLARY: 111 mg/dL — AB (ref 65–99)
GLUCOSE-CAPILLARY: 146 mg/dL — AB (ref 65–99)
Glucose-Capillary: 116 mg/dL — ABNORMAL HIGH (ref 65–99)
Glucose-Capillary: 122 mg/dL — ABNORMAL HIGH (ref 65–99)
Glucose-Capillary: 124 mg/dL — ABNORMAL HIGH (ref 65–99)

## 2016-02-17 LAB — BASIC METABOLIC PANEL
ANION GAP: 5 (ref 5–15)
Anion gap: 6 (ref 5–15)
BUN: 12 mg/dL (ref 6–20)
BUN: 11 mg/dL (ref 6–20)
CHLORIDE: 104 mmol/L (ref 101–111)
CHLORIDE: 104 mmol/L (ref 101–111)
CO2: 33 mmol/L — AB (ref 22–32)
CO2: 34 mmol/L — AB (ref 22–32)
Calcium: 8.8 mg/dL — ABNORMAL LOW (ref 8.9–10.3)
Calcium: 8.9 mg/dL (ref 8.9–10.3)
Creatinine, Ser: 0.96 mg/dL (ref 0.61–1.24)
Creatinine, Ser: 1.05 mg/dL (ref 0.61–1.24)
GFR calc Af Amer: >60 mL/min (ref >60)
GFR calc non Af Amer: >60 mL/min (ref >60)
GFR calc non Af Amer: >60 mL/min (ref >60)
GLUCOSE: 117 mg/dL — AB (ref 65–99)
Glucose, Bld: 128 mg/dL — ABNORMAL HIGH (ref 65–99)
POTASSIUM: 4 mmol/L (ref 3.5–5.1)
POTASSIUM: 4.1 mmol/L (ref 3.5–5.1)
SODIUM: 143 mmol/L (ref 135–145)
SODIUM: 143 mmol/L (ref 135–145)

## 2016-02-17 LAB — CBC WITH DIFFERENTIAL/PLATELET
BASOS ABS: 0.1 10*3/uL (ref 0.0–0.1)
Basophils Relative: 1 %
EOS PCT: 3 %
Eosinophils Absolute: 0.3 10*3/uL (ref 0.0–0.7)
HCT: 55.9 % — ABNORMAL HIGH (ref 39.0–52.0)
Hemoglobin: 16.5 g/dL (ref 13.0–17.0)
LYMPHS PCT: 14 %
Lymphs Abs: 1.3 10*3/uL (ref 0.7–4.0)
MCH: 31.7 pg (ref 26.0–34.0)
MCHC: 29.5 g/dL — ABNORMAL LOW (ref 30.0–36.0)
MCV: 107.3 fL — AB (ref 78.0–100.0)
Monocytes Absolute: 1 10*3/uL (ref 0.1–1.0)
Monocytes Relative: 10 %
Neutro Abs: 7.1 10*3/uL (ref 1.7–7.7)
Neutrophils Relative %: 73 %
PLATELETS: 183 10*3/uL (ref 150–400)
RBC: 5.21 MIL/uL (ref 4.22–5.81)
RDW: 13.4 % (ref 11.5–15.5)
WBC: 9.8 10*3/uL (ref 4.0–10.5)

## 2016-02-17 LAB — POCT I-STAT 3, ART BLOOD GAS (G3+)
ACID-BASE EXCESS: 6 mmol/L — AB (ref 0.0–2.0)
Bicarbonate: 35.6 mEq/L — ABNORMAL HIGH (ref 20.0–24.0)
O2 Saturation: 87 %
PCO2 ART: 70 mmHg — AB (ref 35.0–45.0)
PH ART: 7.312 — AB (ref 7.350–7.450)
PO2 ART: 60 mmHg — AB (ref 80.0–100.0)
Patient temperature: 97.8
TCO2: 38 mmol/L (ref 0–100)

## 2016-02-17 LAB — CBC
HCT: 60.3 % — ABNORMAL HIGH (ref 39.0–52.0)
HEMOGLOBIN: 17.5 g/dL — AB (ref 13.0–17.0)
MCH: 31.2 pg (ref 26.0–34.0)
MCHC: 29 g/dL — AB (ref 30.0–36.0)
MCV: 107.5 fL — AB (ref 78.0–100.0)
Platelets: 156 10*3/uL (ref 150–400)
RBC: 5.61 MIL/uL (ref 4.22–5.81)
RDW: 13.4 % (ref 11.5–15.5)
WBC: 10.7 10*3/uL — ABNORMAL HIGH (ref 4.0–10.5)

## 2016-02-17 LAB — BLOOD GAS, ARTERIAL
Acid-Base Excess: 7.4 mmol/L — ABNORMAL HIGH (ref 0.0–2.0)
Bicarbonate: 35.1 mEq/L — ABNORMAL HIGH (ref 20.0–24.0)
DRAWN BY: 406621
Delivery systems: POSITIVE
Expiratory PAP: 6
FIO2: 0.5
Inspiratory PAP: 12
MODE: POSITIVE
O2 SAT: 94.7 %
PATIENT TEMPERATURE: 98.7
PCO2 ART: 92.7 mmHg — AB (ref 35.0–45.0)
PO2 ART: 82.3 mmHg (ref 80.0–100.0)
TCO2: 37.9 mmol/L (ref 0–100)
pH, Arterial: 7.203 — ABNORMAL LOW (ref 7.350–7.450)

## 2016-02-17 LAB — URINALYSIS, ROUTINE W REFLEX MICROSCOPIC
Bilirubin Urine: NEGATIVE
GLUCOSE, UA: NEGATIVE mg/dL
Ketones, ur: NEGATIVE mg/dL
Nitrite: NEGATIVE
PH: 6.5 (ref 5.0–8.0)
Protein, ur: NEGATIVE mg/dL
SPECIFIC GRAVITY, URINE: 1.014 (ref 1.005–1.030)

## 2016-02-17 LAB — PHOSPHORUS
PHOSPHORUS: 3.3 mg/dL (ref 2.5–4.6)
Phosphorus: 3.2 mg/dL (ref 2.5–4.6)

## 2016-02-17 LAB — TROPONIN I
Troponin I: 0.03 ng/mL (ref <0.03)
Troponin I: 0.03 ng/mL (ref <0.03)

## 2016-02-17 LAB — STREP PNEUMONIAE URINARY ANTIGEN: Strep Pneumo Urinary Antigen: NEGATIVE

## 2016-02-17 LAB — MAGNESIUM
Magnesium: 2 mg/dL (ref 1.7–2.4)
Magnesium: 2 mg/dL (ref 1.7–2.4)

## 2016-02-17 LAB — PROCALCITONIN

## 2016-02-17 MED ORDER — TOBRAMYCIN-DEXAMETHASONE 0.3-0.1 % OP OINT
TOPICAL_OINTMENT | Freq: Two times a day (BID) | OPHTHALMIC | Status: DC
  Administered 2016-02-17 – 2016-02-18 (×3): via OPHTHALMIC
  Administered 2016-02-18: 1 via OPHTHALMIC
  Administered 2016-02-19 – 2016-02-21 (×6): via OPHTHALMIC
  Administered 2016-02-22: 1 via OPHTHALMIC
  Administered 2016-02-22 – 2016-02-23 (×2): via OPHTHALMIC
  Filled 2016-02-17 (×2): qty 3.5

## 2016-02-17 MED ORDER — CETYLPYRIDINIUM CHLORIDE 0.05 % MT LIQD
7.0000 mL | Freq: Two times a day (BID) | OROMUCOSAL | Status: DC
  Administered 2016-02-17 – 2016-02-20 (×8): 7 mL via OROMUCOSAL

## 2016-02-17 MED ORDER — FUROSEMIDE 10 MG/ML IJ SOLN
80.0000 mg | Freq: Three times a day (TID) | INTRAMUSCULAR | Status: DC
  Administered 2016-02-17 – 2016-02-18 (×2): 80 mg via INTRAVENOUS
  Filled 2016-02-17 (×2): qty 8

## 2016-02-17 MED ORDER — FUROSEMIDE 10 MG/ML IJ SOLN
60.0000 mg | Freq: Two times a day (BID) | INTRAMUSCULAR | Status: DC
  Administered 2016-02-17: 60 mg via INTRAVENOUS
  Filled 2016-02-17 (×2): qty 6

## 2016-02-17 MED ORDER — CHLORHEXIDINE GLUCONATE 0.12 % MT SOLN
15.0000 mL | Freq: Two times a day (BID) | OROMUCOSAL | Status: DC
  Administered 2016-02-17 – 2016-02-23 (×10): 15 mL via OROMUCOSAL
  Filled 2016-02-17 (×11): qty 15

## 2016-02-17 MED ORDER — FUROSEMIDE 10 MG/ML IJ SOLN
20.0000 mg | Freq: Once | INTRAMUSCULAR | Status: AC
  Administered 2016-02-17: 20 mg via INTRAVENOUS

## 2016-02-17 NOTE — Progress Notes (Signed)
Pt taken off of BIPAP at 1825. Pt placed on 4LNC. Pt stable, vital signs stable. RT will continue to monitor.

## 2016-02-17 NOTE — Op Note (Signed)
NAME:  [name], [name] NO.:  [id number]  MEDICAL RECORD NO.:  [id number]  LOCATION:  2S08C                        FACILITY:  [facility]  PHYSICIAN:  Venia Carbon. Frederico Hamman, M.D.DATE OF BIRTH:  [birth year]  DATE OF PROCEDURE:  02/16/2016 DATE OF DISCHARGE:                              OPERATIVE REPORT   PREOPERATIVE DIAGNOSES:  Right 6 Nerve palsy : Esotropia, right eye; right facial palsy with right lower lid ptosis; and ectropion of the right lower eyelid.  PROCEDURE: 1. Right lateral rectus resection of 8 mm: via plication and a right superior     rectus recession,of 2 mm. 2. Lateral tarsal strip procedure of the right lower eyelid.  ANESTHESIA:  General with endotracheal intubation.  POSTOPERATIVE DIAGNOSES:  Status post right lateral rectus resection of 8 mm, a right superior rectus recession of 2 mm, and a right inferior eyelid lateral tarsal strip.  SURGEON:  Venia Carbon. Frederico Hamman, MD.  ANESTHESIA:  General with endotracheal intubation.  INDICATION FOR PROCEDURE:  [name] is a 55 year old male, who is status post myocardial infarction with a long history of coronary artery disease.  He also has a medical comorbidity of obesity, fibromyalgia, diabetes mellitus, history of tailbone cyst, kidney stones, and status post a stroke.  These comorbidities specifically ischemic vasculopathy  resulted in a right facial nerve palsy with right 6th nerve palsy resulting in diplopia and esotropia with hypertropia of the right eye and a right lower lid ectropion with excessive epiphora and recurrent conjunctivitis.  The procedure is indicated to correct the diplopia, excessive tearing and lacrimation, and recurrent conjunctivitis of the right eye.  Prior to the procedure informed  consent was obtained.  The risks and benefits of the procedure were discussed with the patient.  DESCRIPTION OF TECHNIQUE:  The patient was taken into the operating room, placed in the supine  position, was intubated, and the entire face was prepped and draped in usual sterile fashion.  My attention was then first directed to the right eye.  A lid speculum was placed.  Forced duction tests were performed and found to be negative.  The globe was then held in the inferior temporal quadrant.  The eye was then elevated and adducted.  An incision was then made through the inferior temporal fornix, taken down to the posterior subtenons space.  The right lateral rectus tendon was isolated on a Stevens hook.  This was subsequently passed to a Green hook and a second Green hook was then passed beneath the tendon and used to hold the globe in an elevated and adducted position.  Next, the tenons and overlying fascia were carefully dissected free from the tendon.  A mark was then placed on the tendon at 8 mm from its native insertion.  The tendon was then imbricated with 6-0 Vicryl suture taking 2 locking bites at the medial and temporal apices of the mark.  The sutures were then advanced to the native insertion site and the tendon was resected by plication, and reattached to the sclera at the insertion site via adjustable suture fashion. Sutures were passed underneath the conjunctiva.  My attention was then directed to the superior rectus tendon.  The globe was held  in the superior temporal quadrant.  The eye was depressed and slightly abducted.  An incision was made in the uperior temporal quadrant, taken down to the posterior subtenons space and the superior rectus tendon was then isolated on a Stevens hook, subsequently on a Green hook.  A second Green hook was then passed beneath the tendon.  This was used to hold the globe in  in an abducted and deorsumducted  position.  Next, the tendon was then carefully dissected free from its overlying muscle fascia and the  intermuscular septa were transected.  The tendon was then carefully imbricated on 6-0 Vicryl suture taking 2 locking bites  at the medial and temporal apices.  The tendon was then dissected free from the globe and recessed to position 2 mm posterior to its native insertion.  It was then reattached to the globe in an adjustable suture fashion.  The sutures were then reposited underneath the conjunctiva and my attention was then directed to the right lower lid ectropion.  The patient was slightly positioned with  his head to the left, face turned to the left and the orbital rim was palpated at the right lower lid.  A mark was then placed on the temporal aspect of the orbital rim and a mark  had been previously placed on the right lower lid indicating the position of relocating of the lower lid at the orbital septum.  A 15 blade was then used to transect the skin and the orbicularis down to the level of the orbital rim.  Hemostasis was achieved with bipolar cautery. The superficial fascia was then cleared from the orbital rim with blunt Qtips to expose the inner and outer aspect of the orbital rim.  Next, the lid was completely dissected free from the lateral aspect of the Its obicularis attatchments and the orbicularis and epidermis then stripped for approximately 4.5 mm from the right posterior lamellar surface to expose the tarsus.  The anterior surface was then treated with cautery to remove Any conjunctival  epithelial cells and the excess tarsal strip was then removed.  The residual tarsal stump was then imbricated on 5-0 Prolene suture taking 2 bites on 5-0 prolene suture.  The superior suture being placed first which was followed by the inferior suture.  The sutures were then passed through the periosteum of the orbital rim which had been previously exposed.  Sutures were then exited and they were tied and then pulled towards the orbital rim and the right lower lid checked for adequate tension and anatomical position.  Once this was obtained, the sutures tied securely and then passed through the soft  tissues before cutting the sutures thereby keeping the free edges off the soft tissue in the defect.  Next, lateral repair of the lid commissure was conducted using 6-0 Vicryl sutures.  The sutures passed through the undersurface of the upper lid passing through the meibomian glands, exiting, and then passing through the meibomian glands of the lower lid and then entering the soft tissue and sutures tied to itself.  Next, the incisional defect was closed with interrupted 5-0 Vicryl sutures.  This was then followed by closure of the skin with a running 6-0 Prolene suture.  At the completion of the procedure, Benzoin was placed on the skin surface and a Steri-Strips.  At this point, we were considering to do adjustable sutures but the re assesment of  the difficulty the patient would have with extubation and then cooperation with suture adjustment given his  labile respiratory and cardiovascular status: I decided to convert to standard non adjustable technique.The preplaced adjustable sutures were then adjusted in a conventional fashion. Sutures were tied permanently and the excess sutures were removed.  The conjunctiva was repositioned.  TobraDex ointment was then instilled in inferior fornices of the right eye.  The patient was once again eventually transferred from the operating room to recovery room, and the patient was later extubated approximately 3 hours after the surgical procedure; however, the patient maintained low oxygen saturations and decision was made to keep the patient in the critical care unit for overnight observation.  As far as the procedure itself was concerned, there were no apparent complications.     Legrand Como A. Frederico Hamman, M.D.     MAS/MEDQ  D:  02/16/2016  T:  02/16/2016  Job:  [id number]

## 2016-02-17 NOTE — Progress Notes (Addendum)
Report obtained from christine rn at this time.  Will take over care at this time.

## 2016-02-17 NOTE — H&P (Addendum)
PULMONARY / CRITICAL CARE MEDICINE   Name: [name] MRN: [id number] DOB: [birth year]    ADMISSION DATE:  02/16/2016 CONSULTATION DATE:  02/16/2016  REFERRING MD:  Oren Bracket, M.D. / Anesthesia  CHIEF COMPLAINT:  Acute Respiratory Failure  BRIEF   Patient underwent right eye surgery today and intubation with 8.0 endotracheal tube. Patient was extubated in the post anesthesia care unit reportedly pulling 1200 mL tidal volumes with minimal support. Patient has persistently required oxygen at 4-6 L/m by nasal cannula to maintain saturations. PCCM was consulted by anesthesia for admission. Patient reports prior to admission he had baseline cough which was productive of a clear to yellow phlegm. Denies any subjective fever, chills, or sweats. Denies any recent sick contacts. Denies any increased dyspnea. Denies any sore throat or sinus congestion. At present the patient denies any headache. He denies any nausea or vomiting. He denies any chest pain, pressure, or tightness. He does feel as though he was breathing more comfortably on nasal cannula oxygen before BiPAP.   reports that he has been smoking Cigarettes.  He has a 5 pack-year smoking history. He uses smokeless tobacco.    STUDIES:  Port CXR 6/28: New right lower lung opacity. EKG 6/28>>  MICROBIOLOGY: MRSA PCR 6/28>>> Blood Ctx x2 6/28>>> Sputum Ctx 6/28>>> Urine Strep Ag 6/28>>> Urine Legionella Ag 6/28>>>  ANTIBIOTICS: Doxycycline 6/28>>>  Anti-infectives    Start     Dose/Rate Route Frequency Ordered Stop   02/16/16 2000  doxycycline (VIBRAMYCIN) 100 mg in dextrose 5 % 250 mL IVPB  Status:  Discontinued     100 mg 125 mL/hr over 120 Minutes Intravenous Every 12 hours 02/16/16 1752 02/17/16 1102       LINES/TUBES: PIV x1   SIGNIFICANT EVENTS: 6/28 - Presented for eye surgery 6/28 - Admitted from PACU with worsening respiratory status   SUBJECTIVE/OVERNIGHT/INTERVAL HX 6/29 - per bedside RN 2 x  intubation in OR/Pacu yesterday. Now on 80% fio2 bipap.     VITAL SIGNS: BP 109/52 mmHg  Pulse 62  Temp(Src) 98.7 F (37.1 C) (Oral)  Resp 23  Ht 5\' 10"  (1.778 m)  Wt 166.515 kg (367 lb 1.6 oz)  BMI 52.67 kg/m2  SpO2 91%  HEMODYNAMICS:    VENTILATOR SETTINGS: Vent Mode:  [-] CPAP;PSV FiO2 (%):  [35 %-85 %] 85 % PEEP:  [5 cmH20] 5 cmH20 Pressure Support:  [5 cmH20] 5 cmH20  INTAKE / OUTPUT: I/O last 3 completed shifts: In: 1875 [P.O.:250; I.V.:1375; IV Piggyback:250] Out: 605 [Urine:600; Blood:5]  PHYSICAL EXAMINATION: General:   Obese.  Integument:  Warm & dry. No rash on exposed skin. Bruising around right eye. Lymphatics:  No appreciated cervical or supraclavicular lymphadenoapthy. HEENT:  No scleral icterus. BiPAP mask in place. Cardiovascular:  Regular rate. No edema. No appreciable JVD given body habitus.  Pulmonary:  Mild crackles bilateral lung bases. Normal work of breathing on BiPAP. Speaking in complete sentences. Abdomen: Soft. Normal bowel sounds. Protuberant. Grossly nontender. Musculoskeletal:  Normal bulk and tone. Hand grip strength 5/5 bilaterally. No joint deformity or effusion appreciated. Neurological: RASS -2/-3 equivalent Psychiatric: not evaluable  LABS:  PULMONARY  Recent Labs Lab 02/16/16 1455 02/16/16 2031 02/17/16 1108  PHART 7.258* 7.212* 7.203*  PCO2ART 73.8* 73.3* 92.7*  PO2ART 97.8 78.0* 82.3  HCO3 32.1* 29.4* 35.1*  TCO2 34.4 32 37.9  O2SAT 96.7 92.0 94.7    CBC  Recent Labs Lab 02/11/16 0920 02/16/16 1912 02/17/16 0540  HGB 16.9 15.5 16.5  HCT 56.1*  52.4* 55.9*  WBC 9.2 10.0 9.8  PLT 185 183 183    COAGULATION  Recent Labs Lab 02/16/16 1912  INR 1.17    CARDIAC   Recent Labs Lab 02/16/16 1912 02/17/16 0540  TROPONINI <0.03 0.03*   No results for input(s): PROBNP in the last 168 hours.   CHEMISTRY  Recent Labs Lab 02/11/16 0920 02/16/16 1912 02/17/16 0540  NA 141 142 143  K 4.3 3.6 4.0   CL 107 108 104  CO2 27 29 33*  GLUCOSE 128* 120* 117*  BUN 14 13 12   CREATININE 1.19 1.04 0.96  CALCIUM 9.3 7.9* 8.8*  MG  --   --  2.0  PHOS  --   --  3.3   Estimated Creatinine Clearance: 135.8 mL/min (by C-G formula based on Cr of 0.96).   LIVER  Recent Labs Lab 02/16/16 1912  AST 18  ALT 17  ALKPHOS 57  BILITOT 0.6  PROT 5.8*  ALBUMIN 3.1*  INR 1.17     INFECTIOUS  Recent Labs Lab 02/16/16 1912 02/16/16 2001 02/17/16 0540  LATICACIDVEN  --  1.6  --   PROCALCITON <0.10  --  <0.10     ENDOCRINE CBG (last 3)   Recent Labs  02/16/16 2349 02/17/16 0350 02/17/16 0819  GLUCAP 116* 122* 120*         IMAGING x48h  - image(s) personally visualized  -   highlighted in bold Dg Chest Port 1 View  02/16/2016  CLINICAL DATA:  Shortness of Breath EXAM: PORTABLE CHEST 1 VIEW COMPARISON:  11/20/2012 FINDINGS: Cardiomegaly again noted. Central mild vascular congestion without convincing pulmonary edema. Status post median sternotomy. There is streaky atelectasis or infiltrate in left midlung and right base. IMPRESSION: Cardiomegaly. Status post median sternotomy. Streaky atelectasis or infiltrate left midlung and right base. Electronically Signed   By: Lahoma Crocker M.D.   On: 02/16/2016 16:56      ASSESSMENT / PLAN:  PULMONARY A: Acute Hypoxic Respiratory Failure  Acute on Chronic Hypercarbic Respiratory Failure - Possible OSA/OHS. CAP Chronic Tobacco Use  - needing 80% fio2 and less responsive despite bipap. Worsening hypercapnia  P:   Continue BiPAP -> recheck abg 3pm and i f worse intubate Aggressuive lasix IV to start and see if this helps Wean FiO2 for Sat >92% Albuterol prn  Outpatient PSG  CARDIOVASCULAR A:  H/O HTN H/O CAD - S/P CABG  - likely acute on chronic diast chf  P:  Aggressive lasix IV Monitoring on telemetry Vitals per unit protocol EKG pending Troponin I q6hr x3 Hold home Atenolol, Imdur, RENAL A:   No acute  issues.  P:   Saline lock for diuresis Trending UOP Monitoring renal function & electrolytes daily Replacing electrolytes as indicated  GASTROINTESTINAL A:   No acute issues.  P:   NPO Sips with Meds & Ice Chips  HEMATOLOGIC A:   No acute issues.  P:  Trending cell counts daily w/ CBC SCDs Holding chemical DVT ppx given recent surgery  INFECTIOUS A:   CAP  P:   Dc  Doxycycline given normal PCT Sputum & Blood cultures Urine Legionella & Strep Antigens Trending PCT per algorithm  ENDOCRINE A:   H/O Borderline DM  P:   Accu-Checks q4hr with MD notification parameters  NEUROLOGIC A:   H/O Headache H/O CVA H/O Brain Tumor - S/P Excision.  - drowsy  P:   Check abg - avid opioid and benzo Home Topamax Monitor closely in ICU  FAMILY  - Updates: No family at bedside.  - Inter-disciplinary family meet or Palliative Care meeting due by:  7/5        The patient is critically ill with multiple organ systems failure and requires high complexity decision making for assessment and support, frequent evaluation and titration of therapies, application of advanced monitoring technologies and extensive interpretation of multiple databases.   Critical Care Time devoted to patient care services described in this note is  30  Minutes. This time reflects time of care of this signee Dr Brand Males. This critical care time does not reflect procedure time, or teaching time or supervisory time of PA/NP/Med student/Med Resident etc but could involve care discussion time    Dr. Brand Males, M.D., [facility].C.P Pulmonary and Critical Care Medicine Staff Physician [place] [facility] Pager: [phone], If no answer or between  15:00h - 7:00h: call [phone]  02/17/2016 10:47 AM

## 2016-02-18 ENCOUNTER — Inpatient Hospital Stay (HOSPITAL_COMMUNITY): Payer: Medicare PPO

## 2016-02-18 DIAGNOSIS — J9621 Acute and chronic respiratory failure with hypoxia: Principal | ICD-10-CM

## 2016-02-18 LAB — POCT I-STAT 3, ART BLOOD GAS (G3+)
Acid-Base Excess: 11 mmol/L — ABNORMAL HIGH (ref 0.0–2.0)
Bicarbonate: 43.8 mEq/L — ABNORMAL HIGH (ref 20.0–24.0)
O2 Saturation: 84 %
PCO2 ART: 89.6 mmHg — AB (ref 35.0–45.0)
PO2 ART: 58 mmHg — AB (ref 80.0–100.0)
TCO2: 46 mmol/L (ref 0–100)
pH, Arterial: 7.298 — ABNORMAL LOW (ref 7.350–7.450)

## 2016-02-18 LAB — BASIC METABOLIC PANEL
ANION GAP: 11 (ref 5–15)
BUN: 15 mg/dL (ref 6–20)
CALCIUM: 8.9 mg/dL (ref 8.9–10.3)
CO2: 36 mmol/L — AB (ref 22–32)
Chloride: 97 mmol/L — ABNORMAL LOW (ref 101–111)
Creatinine, Ser: 1.06 mg/dL (ref 0.61–1.24)
GLUCOSE: 146 mg/dL — AB (ref 65–99)
POTASSIUM: 3.4 mmol/L — AB (ref 3.5–5.1)
Sodium: 144 mmol/L (ref 135–145)

## 2016-02-18 LAB — GLUCOSE, CAPILLARY
GLUCOSE-CAPILLARY: 164 mg/dL — AB (ref 65–99)
Glucose-Capillary: 116 mg/dL — ABNORMAL HIGH (ref 65–99)
Glucose-Capillary: 146 mg/dL — ABNORMAL HIGH (ref 65–99)
Glucose-Capillary: 150 mg/dL — ABNORMAL HIGH (ref 65–99)
Glucose-Capillary: 158 mg/dL — ABNORMAL HIGH (ref 65–99)
Glucose-Capillary: 177 mg/dL — ABNORMAL HIGH (ref 65–99)

## 2016-02-18 LAB — LEGIONELLA PNEUMOPHILA SEROGP 1 UR AG: L. pneumophila Serogp 1 Ur Ag: NEGATIVE

## 2016-02-18 LAB — EXPECTORATED SPUTUM ASSESSMENT W REFEX TO RESP CULTURE: SPECIAL REQUESTS: NORMAL

## 2016-02-18 LAB — PROCALCITONIN

## 2016-02-18 LAB — MAGNESIUM: Magnesium: 1.9 mg/dL (ref 1.7–2.4)

## 2016-02-18 LAB — LACTIC ACID, PLASMA: Lactic Acid, Venous: 1 mmol/L (ref 0.5–1.9)

## 2016-02-18 LAB — PHOSPHORUS: Phosphorus: 2.5 mg/dL (ref 2.5–4.6)

## 2016-02-18 LAB — TROPONIN I: Troponin I: 0.03 ng/mL

## 2016-02-18 MED ORDER — HYDRALAZINE HCL 20 MG/ML IJ SOLN
10.0000 mg | INTRAMUSCULAR | Status: DC | PRN
  Administered 2016-02-18 (×2): 10 mg via INTRAVENOUS
  Filled 2016-02-18 (×2): qty 1

## 2016-02-18 MED ORDER — DOXYCYCLINE HYCLATE 100 MG PO TABS
100.0000 mg | ORAL_TABLET | Freq: Two times a day (BID) | ORAL | Status: AC
  Administered 2016-02-18 – 2016-02-22 (×10): 100 mg via ORAL
  Filled 2016-02-18 (×11): qty 1

## 2016-02-18 MED ORDER — ONDANSETRON HCL 4 MG/2ML IJ SOLN
4.0000 mg | Freq: Three times a day (TID) | INTRAMUSCULAR | Status: DC | PRN
  Administered 2016-02-18: 4 mg via INTRAVENOUS
  Filled 2016-02-18: qty 2

## 2016-02-18 MED ORDER — MAGNESIUM SULFATE 2 GM/50ML IV SOLN
2.0000 g | Freq: Once | INTRAVENOUS | Status: AC
  Administered 2016-02-18: 2 g via INTRAVENOUS
  Filled 2016-02-18: qty 50

## 2016-02-18 MED ORDER — INSULIN ASPART 100 UNIT/ML ~~LOC~~ SOLN
0.0000 [IU] | Freq: Three times a day (TID) | SUBCUTANEOUS | Status: DC
  Administered 2016-02-18 – 2016-02-19 (×2): 4 [IU] via SUBCUTANEOUS
  Administered 2016-02-19 – 2016-02-20 (×3): 3 [IU] via SUBCUTANEOUS
  Administered 2016-02-20: 4 [IU] via SUBCUTANEOUS
  Administered 2016-02-21 (×2): 3 [IU] via SUBCUTANEOUS
  Administered 2016-02-22: 4 [IU] via SUBCUTANEOUS
  Administered 2016-02-22: 3 [IU] via SUBCUTANEOUS
  Administered 2016-02-22: 4 [IU] via SUBCUTANEOUS
  Administered 2016-02-23: 3 [IU] via SUBCUTANEOUS
  Administered 2016-02-23: 4 [IU] via SUBCUTANEOUS
  Administered 2016-02-23: 3 [IU] via SUBCUTANEOUS

## 2016-02-18 MED ORDER — FUROSEMIDE 10 MG/ML IJ SOLN
40.0000 mg | Freq: Three times a day (TID) | INTRAMUSCULAR | Status: DC
  Administered 2016-02-18 – 2016-02-19 (×3): 40 mg via INTRAVENOUS
  Filled 2016-02-18 (×3): qty 4

## 2016-02-18 MED ORDER — POTASSIUM CHLORIDE 10 MEQ/100ML IV SOLN
10.0000 meq | INTRAVENOUS | Status: AC
  Administered 2016-02-18 (×2): 10 meq via INTRAVENOUS
  Filled 2016-02-18 (×2): qty 100
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
## 2016-02-18 NOTE — Care Management Important Message (Signed)
Important Message  Patient Details  Name: [name] MRN: [id number] Date of Birth: [birth year]   Medicare Important Message Given:  Yes    [name] 02/18/2016, 10:53 AM

## 2016-02-18 NOTE — Progress Notes (Signed)
[facility] Progress Note Patient Name: [name] DOB: [birth year] MRN: [id number]   Date of Service  02/18/2016  HPI/Events of Note  Patient with N/V  eICU Interventions  PRN zofran ordered     Intervention Category Minor Interventions: Routine modifications to care plan (e.g. PRN medications for pain, fever)  DETERDING,ELIZABETH 02/18/2016, 12:23 AM

## 2016-02-18 NOTE — Progress Notes (Signed)
ABG results given to Venia Carbon, RN.

## 2016-02-18 NOTE — Progress Notes (Signed)
    Recent Labs Lab 02/16/16 1455 02/16/16 2031 02/17/16 1108 02/17/16 1432 02/18/16 1145  PHART 7.258* 7.212* 7.203* 7.312* 7.298*  PCO2ART 73.8* 73.3* 92.7* 70.0* 89.6*  PO2ART 97.8 78.0* 82.3 60.0* 58.0*  HCO3 32.1* 29.4* 35.1* 35.6* 43.8*  TCO2 34.4 32 37.9 38 46  O2SAT 96.7 92.0 94.7 87.0 84.0    [place] but gas bad  Plan Cancel transfer Keep in icu No SDU Back on bipap  Dr. Brand Males, M.D., [facility].C.P Pulmonary and Critical Care Medicine Staff Physician [place] [facility] Pager: [phone], If no answer or between  15:00h - 7:00h: call [phone]  02/18/2016 11:57 AM

## 2016-02-18 NOTE — Progress Notes (Signed)
Dr. Chase Caller notified of blood gas results, pt unchanged in level of consciousness, orders to place pt back on bipap and stay in ICU.  RT notified.

## 2016-02-19 ENCOUNTER — Inpatient Hospital Stay (HOSPITAL_COMMUNITY): Payer: Medicare PPO

## 2016-02-19 LAB — POCT I-STAT 3, ART BLOOD GAS (G3+)
ACID-BASE EXCESS: 16 mmol/L — AB (ref 0.0–2.0)
BICARBONATE: 46.4 meq/L — AB (ref 20.0–24.0)
O2 Saturation: 83 %
PCO2 ART: 75.1 mmHg — AB (ref 35.0–45.0)
PO2 ART: 50 mmHg — AB (ref 80.0–100.0)
Patient temperature: 98.3
TCO2: 49 mmol/L (ref 0–100)
pH, Arterial: 7.398 (ref 7.350–7.450)

## 2016-02-19 LAB — BASIC METABOLIC PANEL
ANION GAP: 10 (ref 5–15)
BUN: 26 mg/dL — AB (ref 6–20)
CO2: 41 mmol/L — ABNORMAL HIGH (ref 22–32)
CREATININE: 1.22 mg/dL (ref 0.61–1.24)
Calcium: 9.5 mg/dL (ref 8.9–10.3)
Chloride: 94 mmol/L — ABNORMAL LOW (ref 101–111)
GFR calc Af Amer: >60 mL/min (ref >60)
GFR calc non Af Amer: >60 mL/min (ref >60)
GLUCOSE: 148 mg/dL — AB (ref 65–99)
POTASSIUM: 2.9 mmol/L — AB (ref 3.5–5.1)
SODIUM: 145 mmol/L (ref 135–145)

## 2016-02-19 LAB — GLUCOSE, CAPILLARY
GLUCOSE-CAPILLARY: 166 mg/dL — AB (ref 65–99)
GLUCOSE-CAPILLARY: 149 mg/dL — AB (ref 65–99)
GLUCOSE-CAPILLARY: 139 mg/dL — AB (ref 65–99)
GLUCOSE-CAPILLARY: 121 mg/dL — AB (ref 65–99)

## 2016-02-19 LAB — MAGNESIUM: Magnesium: 2.7 mg/dL — ABNORMAL HIGH (ref 1.7–2.4)

## 2016-02-19 LAB — PHOSPHORUS: PHOSPHORUS: 1.1 mg/dL — AB (ref 2.5–4.6)

## 2016-02-19 MED ORDER — POTASSIUM CHLORIDE CRYS ER 20 MEQ PO TBCR
40.0000 meq | EXTENDED_RELEASE_TABLET | ORAL | Status: AC
  Administered 2016-02-19 (×2): 40 meq via ORAL
  Filled 2016-02-19 (×2): qty 2

## 2016-02-19 MED ORDER — DILTIAZEM HCL 100 MG IV SOLR
5.0000 mg/h | INTRAVENOUS | Status: DC
  Administered 2016-02-19: 5 mg/h via INTRAVENOUS
  Filled 2016-02-19: qty 100

## 2016-02-19 MED ORDER — METOPROLOL TARTRATE 5 MG/5ML IV SOLN
3.0000 mg | Freq: Once | INTRAVENOUS | Status: AC
  Administered 2016-02-19: 3 mg via INTRAVENOUS

## 2016-02-19 MED ORDER — POTASSIUM CHLORIDE 10 MEQ/100ML IV SOLN
INTRAVENOUS | Status: AC
  Administered 2016-02-19: 10 meq
  Filled 2016-02-19: qty 100

## 2016-02-19 MED ORDER — SODIUM PHOSPHATES 45 MMOLE/15ML IV SOLN
30.0000 mmol | Freq: Once | INTRAVENOUS | Status: AC
  Administered 2016-02-19: 30 mmol via INTRAVENOUS
  Filled 2016-02-19: qty 10

## 2016-02-19 MED ORDER — AMIODARONE HCL IN DEXTROSE 360-4.14 MG/200ML-% IV SOLN
30.0000 mg/h | INTRAVENOUS | Status: DC
  Administered 2016-02-19 – 2016-02-20 (×2): 30 mg/h via INTRAVENOUS
  Filled 2016-02-19 (×3): qty 200

## 2016-02-19 MED ORDER — FUROSEMIDE 10 MG/ML IJ SOLN
40.0000 mg | Freq: Two times a day (BID) | INTRAMUSCULAR | Status: DC
  Administered 2016-02-19 – 2016-02-20 (×2): 40 mg via INTRAVENOUS
  Filled 2016-02-19: qty 4

## 2016-02-19 MED ORDER — POTASSIUM CHLORIDE 10 MEQ/100ML IV SOLN
10.0000 meq | INTRAVENOUS | Status: AC
  Administered 2016-02-19 (×3): 10 meq via INTRAVENOUS
  Filled 2016-02-19 (×2): qty 100

## 2016-02-19 MED ORDER — POTASSIUM CHLORIDE CRYS ER 20 MEQ PO TBCR
40.0000 meq | EXTENDED_RELEASE_TABLET | Freq: Once | ORAL | Status: AC
  Administered 2016-02-19: 40 meq via ORAL
  Filled 2016-02-19: qty 2

## 2016-02-19 MED ORDER — BUDESONIDE 0.25 MG/2ML IN SUSP
0.2500 mg | Freq: Two times a day (BID) | RESPIRATORY_TRACT | Status: DC
  Administered 2016-02-19 – 2016-02-22 (×7): 0.25 mg via RESPIRATORY_TRACT
  Filled 2016-02-19 (×7): qty 2

## 2016-02-19 MED ORDER — AMIODARONE HCL IN DEXTROSE 360-4.14 MG/200ML-% IV SOLN
60.0000 mg/h | INTRAVENOUS | Status: DC
  Administered 2016-02-19: 60 mg/h via INTRAVENOUS

## 2016-02-19 MED ORDER — AMIODARONE LOAD VIA INFUSION
150.0000 mg | Freq: Once | INTRAVENOUS | Status: AC
  Administered 2016-02-19: 150 mg via INTRAVENOUS
  Filled 2016-02-19: qty 83.34

## 2016-02-19 MED ORDER — METOPROLOL TARTRATE 5 MG/5ML IV SOLN
INTRAVENOUS | Status: AC
  Administered 2016-02-19: 5 mg
  Filled 2016-02-19: qty 5

## 2016-02-19 MED ORDER — METOPROLOL TARTRATE 12.5 MG HALF TABLET
12.5000 mg | ORAL_TABLET | Freq: Two times a day (BID) | ORAL | Status: DC
  Administered 2016-02-19 – 2016-02-20 (×3): 12.5 mg via ORAL
  Filled 2016-02-19 (×3): qty 1

## 2016-02-19 MED ORDER — IPRATROPIUM-ALBUTEROL 0.5-2.5 (3) MG/3ML IN SOLN
RESPIRATORY_TRACT | Status: DC
Start: 2016-02-19 — End: 2016-02-19
  Filled 2016-02-19: qty 3

## 2016-02-19 MED ORDER — IPRATROPIUM-ALBUTEROL 0.5-2.5 (3) MG/3ML IN SOLN
3.0000 mL | RESPIRATORY_TRACT | Status: DC
  Administered 2016-02-19 – 2016-02-20 (×6): 3 mL via RESPIRATORY_TRACT
  Filled 2016-02-19 (×6): qty 3

## 2016-02-19 NOTE — Progress Notes (Signed)
[facility] Progress Note Patient Name: [name] DOB: [birth year] MRN: [id number]   Date of Service  02/19/2016  HPI/Events of Note  Cam check on pt. Asleep. Comfortable.  150/80. 70, 20 92% on 2L SR   eICU Interventions  Will cont to observe.         [place] 02/19/2016, 8:31 PM

## 2016-02-19 NOTE — Progress Notes (Signed)
PULMONARY / CRITICAL CARE MEDICINE   Name: [name] MRN: [id number] DOB: 12-Oct-1960   PCP Leonard Downing, MD  ADMISSION DATE:  02/16/2016 CONSULTATION DATE:  02/16/2016  REFERRING MD:  Oren Bracket, M.D. / Anesthesia  CHIEF COMPLAINT:  Acute Respiratory Failure  BRIEF   Patient underwent right eye surgery today and intubation with 8.0 endotracheal tube. Patient was extubated in the post anesthesia care unit reportedly pulling 1200 mL tidal volumes with minimal support. Patient has persistently required oxygen at 4-6 L/m by nasal cannula to maintain saturations. PCCM was consulted by anesthesia for admission. Patient reports prior to admission he had baseline cough which was productive of a clear to yellow phlegm. Denies any subjective fever, chills, or sweats. Denies any recent sick contacts. Denies any increased dyspnea. Denies any sore throat or sinus congestion. At present the patient denies any headache. He denies any nausea or vomiting. He denies any chest pain, pressure, or tightness. He does feel as though he was breathing more comfortably on nasal cannula oxygen before BiPAP.   reports that he has been smoking Cigarettes.  He has a 5 pack-year smoking history. He uses smokeless tobacco.   MICROBIOLOGY: MRSA PCR 6/28>>>neg Blood Ctx x2 6/28>>> neg as of 02/19/2016 Sputum Ctx 6/28>>> neg as of  02/19/2016 Urine Strep Ag 6/28>>>neg Urine Legionella Ag 6/28>>>     ANTIBIOTICS:  Anti-infectives    Start     Dose/Rate Route Frequency Ordered Stop   02/18/16 1000  doxycycline (VIBRA-TABS) tablet 100 mg     100 mg Oral Every 12 hours 02/18/16 0925 02/23/16 0959   02/16/16 2000  doxycycline (VIBRAMYCIN) 100 mg in dextrose 5 % 250 mL IVPB  Status:  Discontinued     100 mg 125 mL/hr over 120 Minutes Intravenous Every 12 hours 02/16/16 1752 02/17/16 1102      SIGNIFICANT EVENTS: 6/28 - Presented for eye surgery 6/28 - Admitted from PACU with worsening respiratory  status 6/29 - per bedside RN 2 x intubation in OR/Pacu yesterday. Now on 80% fio2 bipap.   -> LASIX  6/30 - is off bipap since yesterday. On 3L [place]. [place] well. Feels thirsty. Acknowledges smoker but denies copd dx of osa/ . RN reporting bloody sputum   SUBJECTIVE/OVERNIGHT/INTERVAL HX 7/1./17: transfer cancelled yesterday due to pco2 80s with pH 7.2s though it was improved and he was awake. Rx with restart doxy and biPAP qhs and day time on/off. This AM -> RN says that he is dependent on bipap. Desats on 2L [place] to 80% though no distress. Patient hiimself says he needs to sit up on chair. . Desats on 2L [place] to 80% though no distress. Patient hiimself says he needs to sit up on chair. Wants his atenolol and hydrocodone back  VITAL SIGNS: BP 147/68 mmHg  Pulse 84  Temp(Src) 98.3 F (36.8 C) (Axillary)  Resp 19  Ht 5\' 10"  (1.778 m)  Wt 167.377 kg (369 lb)  BMI 52.95 kg/m2  SpO2 94%  HEMODYNAMICS:    VENTILATOR SETTINGS: Vent Mode:  [-]  FiO2 (%):  [60 %-70 %] 60 %  INTAKE / OUTPUT: I/O last 3 completed shifts: In: 67 [P.O.:60; IV Piggyback:450] Out: [id number]; Emesis/NG output:500]  PHYSICAL EXAMINATION: General:   Obese.  Integument:  Warm & dry. No rash on exposed skin. Bruising around right eye. Lymphatics:  No appreciated cervical or supraclavicular lymphadenoapthy. HEENT:  No scleral icterus. BiPAP mask in place. Cardiovascular:  Regular rate. No edema. No appreciable JVD given  body habitus.  Pulmonary:  Mild crackles bilateral lung bases. Normal work of breathing  . Speaking in complete sentences. Abdomen: Soft. Normal bowel sounds. Protuberant. Grossly nontender. Musculoskeletal:  Normal bulk and tone. Hand grip strength 5/5 bilaterally. No joint deformity or effusion appreciated. Neurological: RASS +1. CAm-ICu neg for delirium. Movesl all4s. [place] slurred speech from old stroket Psychiatric: not evaluable  LABS:  PULMONARY  Recent Labs Lab  02/16/16 1455 02/16/16 2031 02/17/16 1108 02/17/16 1432 02/18/16 1145  PHART 7.258* 7.212* 7.203* 7.312* 7.298*  PCO2ART 73.8* 73.3* 92.7* 70.0* 89.6*  PO2ART 97.8 78.0* 82.3 60.0* 58.0*  HCO3 32.1* 29.4* 35.1* 35.6* 43.8*  TCO2 34.4 32 37.9 38 46  O2SAT 96.7 92.0 94.7 87.0 84.0    CBC  Recent Labs Lab 02/16/16 1912 02/17/16 0540 02/17/16 1243  HGB 15.5 16.5 17.5*  HCT 52.4* 55.9* 60.3*  WBC 10.0 9.8 10.7*  PLT 183 183 156    COAGULATION  Recent Labs Lab 02/16/16 1912  INR 1.17    CARDIAC    Recent Labs Lab 02/16/16 1912 02/17/16 0540 02/17/16 1243 02/18/16 0331  TROPONINI <0.03 0.03* 0.03* 0.03*   No results for input(s): PROBNP in the last 168 hours.   CHEMISTRY  Recent Labs Lab 02/16/16 1912 02/17/16 0540 02/17/16 1243 02/18/16 0331 02/19/16 0301  NA 142 143 143 144 145  K 3.6 4.0 4.1 3.4* 2.9*  CL 108 104 104 97* 94*  CO2 29 33* 34* 36* 41*  GLUCOSE 120* 117* 128* 146* 148*  BUN 13 12 11 15  26*  CREATININE 1.04 0.96 1.05 1.06 1.22  CALCIUM 7.9* 8.8* 8.9 8.9 9.5  MG  --  2.0 2.0 1.9 2.7*  PHOS  --  3.3 3.2 2.5 1.1*   Estimated Creatinine Clearance: 107.2 mL/min (by C-G formula based on Cr of 1.22).   LIVER  Recent Labs Lab 02/16/16 1912  AST 18  ALT 17  ALKPHOS 57  BILITOT 0.6  PROT 5.8*  ALBUMIN 3.1*  INR 1.17     INFECTIOUS  Recent Labs Lab 02/16/16 2001 02/17/16 0540 02/17/16 1243 02/18/16 0331  LATICACIDVEN 1.6  --   --  1.0  PROCALCITON  --  <0.10 <0.10 <0.10     ENDOCRINE CBG (last 3)   Recent Labs  02/18/16 1216 02/18/16 1602 02/18/16 2211  GLUCAP 164* 116* 146*         IMAGING x48h  - image(s) personally visualized  -   highlighted in bold Dg Chest Port 1 View  02/18/2016  CLINICAL DATA:  Acute respiratory failure. EXAM: PORTABLE CHEST 1 VIEW COMPARISON:  02/17/2016 FINDINGS: Prior median sternotomy. Cardiomegaly. Bilateral airspace opacities are again noted which could represent edema or  infection. No definite visible effusions or acute bony abnormality. IMPRESSION: Continued bilateral airspace opacities, not significantly changed, likely edema or infection. Electronically Signed   By: Rolm Baptise M.D.   On: 02/18/2016 10:24   Dg Chest Port 1 View  02/17/2016  CLINICAL DATA:  Acute respiratory failure. EXAM: PORTABLE CHEST 1 VIEW COMPARISON:  02/16/2016 FINDINGS: Status post median sternotomy and CABG. The heart is enlarged. Patchy infiltrates are identified the lung bases bilaterally, increased since prior study. IMPRESSION: Increased bilateral infiltrates. Cardiomegaly. Electronically Signed   By: Nolon Nations M.D.   On: 02/17/2016 13:03      ASSESSMENT / PLAN:  PULMONARY A: Acute Hypoxic Respiratory Failure  Acute on Chronic Hypercarbic Respiratory Failure - Possible OSA/OHS. CAP Chronic Tobacco Use  -  02/19/16 Much improved with  lasix  But ABG yesterday worrisome so transfer canceled and also today easy desats off bipap reported - ? duie to acute on chronic diast chf v atelectais  P:   Cycle off to on/off biopap for day -> recheck aBG to decdide on hypercapnia and hypoxemia BiPAP QHs - for possible OSA redce lasix IV to 40mg  bid and then clinically decide\ OOB to chair - pulm toilet needed Check Duplex LE given some hyopxemia 02/19/2016  Wean FiO2 for Sat >92% Albuterol prn Get sputum culture Redo doxy x 5 days since 02/18/16   Outpatient PSG  CARDIOVASCULAR A:  H/O HTN H/O CAD - S/P CABG  - likely acute on chronic diast chf (most recent echo May 2017). Slowly improving 02/19/16 but still easy desats on pulse ox. He is demanding his opd atenolol  P:  lasix IV -> reduce based on clincial improveent Monitoring on telemetry Start low dose lopressor in lieu of astenolol for more b1 specificity Hold home  Imdur, - aim to restart 02/20/16 if clinically ok  RENAL A:   No acute issues but hypophos repleted by eMD  P:   Saline lock for diuresis Trending  UOP Monitoring renal function & electrolytes daily Replacing electrolytes as indicated  GASTROINTESTINAL A:   Tolerating oral diet as of 02/19/16  P:   Carb modified heart healthy diet Sips with Meds & Ice Chips  HEMATOLOGIC A:   No acute issues.  P:  Trending cell counts daily w/ CBC SCDs Holding chemical DVT ppx given recent surgery  INFECTIOUS A:   Normal PCT buthaving bloody sputum - chf v bronchitis   - improvdd 71/17   P:   Restart doxy x  5 days since 02/18/16 AWait k sputum culture 02/18/16   ENDOCRINE A:   H/O Borderline DM  P:   Accu-Checks q4hr with MD notification parameters  NEUROLOGIC A:   H/O Headache H/O CVA H/O Brain Tumor - S/P Excision. H/o eye surgery 02/16/2016   - awake - baseline slurred speech  P:   Monitor Per opthalmologt    FAMILY  - Updates: No family at bedside.  - Inter-disciplinary family meet or Palliative Care meeting due by:  7/5    GLobal - keep in ICU      The patient is critically ill with multiple organ systems failure and requires high complexity decision making for assessment and support, frequent evaluation and titration of therapies, application of advanced monitoring technologies and extensive interpretation of multiple databases.   Critical Care Time devoted to patient care services described in this note is  30  Minutes. This time reflects time of care of this signee Dr Brand Males. This critical care time does not reflect procedure time, or teaching time or supervisory time of PA/NP/Med student/Med Resident etc but could involve care discussion time    Dr. Brand Males, M.D., [facility].C.P Pulmonary and Critical Care Medicine Staff Physician [place] [facility] and Critical [facility] Pager: [phone], If no answer or between  15:00h - 7:00h: call [phone]  02/19/2016 8:26 AM

## 2016-02-19 NOTE — Progress Notes (Signed)
  Amiodarone Drug - Drug Interaction Consult Note  Recommendations:  Amiodarone is metabolized by the cytochrome P450 system and therefore has the potential to cause many drug interactions. Amiodarone has an average plasma half-life of 50 days (range 20 to 100 days).   There is potential for drug interactions to occur several weeks or months after stopping treatment and the onset of drug interactions may be slow after initiating amiodarone.   []  Statins: Increased risk of myopathy. Simvastatin- restrict dose to 20mg  daily. Other statins: counsel patients to report any muscle pain or weakness immediately.  []  Anticoagulants: Amiodarone can increase anticoagulant effect. Consider warfarin dose reduction. Patients should be monitored closely and the dose of anticoagulant altered accordingly, remembering that amiodarone levels take several weeks to stabilize.  []  Antiepileptics: Amiodarone can increase plasma concentration of phenytoin, the dose should be reduced. Note that small changes in phenytoin dose can result in large changes in levels. Monitor patient and counsel on signs of toxicity.  [x]  Beta blockers: increased risk of bradycardia, AV block and myocardial depression. Sotalol - avoid concomitant use.  [x]   Calcium channel blockers (diltiazem and verapamil): increased risk of bradycardia, AV block and myocardial depression.  []   Cyclosporine: Amiodarone increases levels of cyclosporine. Reduced dose of cyclosporine is recommended.  []  Digoxin dose should be halved when amiodarone is started.  [x]  Diuretics: increased risk of cardiotoxicity if hypokalemia occurs.  []  Oral hypoglycemic agents (glyburide, glipizide, glimepiride): increased risk of hypoglycemia. Patient's glucose levels should be monitored closely when initiating amiodarone therapy.   []  Drugs that prolong the QT interval:  Torsades de pointes risk may be increased with concurrent use - avoid if possible.  Monitor QTc,  also keep magnesium/potassium WNL if concurrent therapy can't be avoided. Mar[place] Kitchen Antibiotics: e.g. fluoroquinolones, erythromycin. . Antiarrhythmics: e.g. quinidine, procainamide, disopyramide, sotalol. . Antipsychotics: e.g. phenothiazines, haloperidol.  . Lithium, tricyclic antidepressants, and methadone.  Thank You,   Celine Dishman S. Alford Highland, PharmD, [facility] Clinical Staff Pharmacist Pager [phone]  [place], [place]  02/19/2016 2:50 PM

## 2016-02-19 NOTE — Progress Notes (Signed)
[place] Progress Note Patient Name: [name] DOB: 06/15/1961 MRN: [id number]   Date of Service  02/19/2016  HPI/Events of Note  Hypophosphatemia  eICU Interventions  Phos replaced     Intervention Category Intermediate Interventions: Electrolyte abnormality - evaluation and management  Cleven Jansma 02/19/2016, 4:25 AM

## 2016-02-19 NOTE — Progress Notes (Signed)
[place] Progress Note Patient Name: [name] DOB: September 23, 1960 MRN: [id number]   Date of Service  02/19/2016  HPI/Events of Note  F/u on afib. Per RN, pt did not respond to diltiazem. Pt responded to amiodarone.  In SR now. HR 80. 140/70, 24,93%  eICU Interventions  Cont amio drip. Will d/c cardizem.         [place] 02/19/2016, 5:21 PM

## 2016-02-19 NOTE — Progress Notes (Signed)
Dr. Chase Caller notified of pt sustaining svt rate of 170.  BP 154/79.  Orders received.  Will continue to monitor closely.

## 2016-02-19 NOTE — Progress Notes (Signed)
[place] Progress Note Patient Name: [name] DOB: [birth year] MRN: [id number]   Date of Service  02/19/2016  HPI/Events of Note  Hypokalemia  eICU Interventions  Potassium replaced     Intervention Category Intermediate Interventions: Electrolyte abnormality - evaluation and management  DETERDING,ELIZABETH 02/19/2016, 4:15 AM

## 2016-02-19 NOTE — Progress Notes (Signed)
A Fib RVR  Plan amio gtt  Dr. Brand Males, M.D., [facility].C.P Pulmonary and Critical Care Medicine Staff Physician [place] Pulmonary and Critical Care Pager: [phone], If no answer or between  15:00h - 7:00h: call [phone]  02/19/2016 2:23 PM   g

## 2016-02-19 NOTE — Progress Notes (Signed)
Dr. Purnell Shoemaker notified of svt 160 on Cardizem 15mg /hr.  Orders received.

## 2016-02-19 NOTE — Progress Notes (Signed)
Call from eMD   That lopressor IV x 1 did not control svt K 2.9 and got 40 of k but after that got lasix cxr - improved  Plan Start cardizem gtt repklete 40K po + 40 IV  Dr. Brand Males, M.D., [facility].C.P Pulmonary and Critical Care Medicine Staff Physician [place] Pulmonary and Critical Care Pager: [phone], If no answer or between  15:00h - 7:00h: call [phone]  02/19/2016 12:26 PM

## 2016-02-19 NOTE — Progress Notes (Signed)
Dr. Silvestre Mesi paged twice regarding no change in pt status.  Spoke with [place].  Will continue to closely monitor.  129/76

## 2016-02-20 ENCOUNTER — Encounter (HOSPITAL_COMMUNITY): Payer: Medicare PPO

## 2016-02-20 DIAGNOSIS — J9620 Acute and chronic respiratory failure, unspecified whether with hypoxia or hypercapnia: Secondary | ICD-10-CM

## 2016-02-20 LAB — BASIC METABOLIC PANEL
ANION GAP: 10 (ref 5–15)
BUN: 29 mg/dL — ABNORMAL HIGH (ref 6–20)
CALCIUM: 9.1 mg/dL (ref 8.9–10.3)
CO2: 38 mmol/L — ABNORMAL HIGH (ref 22–32)
CREATININE: 1.04 mg/dL (ref 0.61–1.24)
Chloride: 92 mmol/L — ABNORMAL LOW (ref 101–111)
GLUCOSE: 143 mg/dL — AB (ref 65–99)
Potassium: 4.7 mmol/L (ref 3.5–5.1)
Sodium: 140 mmol/L (ref 135–145)

## 2016-02-20 LAB — MAGNESIUM: Magnesium: 2.2 mg/dL (ref 1.7–2.4)

## 2016-02-20 LAB — BLOOD GAS, ARTERIAL
ACID-BASE EXCESS: 17 mmol/L — AB (ref 0.0–2.0)
Bicarbonate: 42.5 mEq/L — ABNORMAL HIGH (ref 20.0–24.0)
DRAWN BY: 437071
O2 CONTENT: 6 L/min
O2 Saturation: 95.6 %
PCO2 ART: 65.6 mmHg — AB (ref 35.0–45.0)
PO2 ART: 75.6 mmHg — AB (ref 80.0–100.0)
Patient temperature: 98.7
TCO2: 44.5 mmol/L (ref 0–100)
pH, Arterial: 7.428 (ref 7.350–7.450)

## 2016-02-20 LAB — GLUCOSE, CAPILLARY
GLUCOSE-CAPILLARY: 115 mg/dL — AB (ref 65–99)
GLUCOSE-CAPILLARY: 118 mg/dL — AB (ref 65–99)
Glucose-Capillary: 153 mg/dL — ABNORMAL HIGH (ref 65–99)
Glucose-Capillary: 136 mg/dL — ABNORMAL HIGH (ref 65–99)

## 2016-02-20 LAB — CULTURE, RESPIRATORY W GRAM STAIN: Culture: NORMAL

## 2016-02-20 LAB — CULTURE, RESPIRATORY

## 2016-02-20 LAB — PHOSPHORUS: PHOSPHORUS: 1.5 mg/dL — AB (ref 2.5–4.6)

## 2016-02-20 MED ORDER — ATENOLOL 50 MG PO TABS
50.0000 mg | ORAL_TABLET | Freq: Two times a day (BID) | ORAL | Status: DC
  Filled 2016-02-20: qty 1

## 2016-02-20 MED ORDER — ATENOLOL 25 MG PO TABS
50.0000 mg | ORAL_TABLET | Freq: Two times a day (BID) | ORAL | Status: DC
  Administered 2016-02-20 – 2016-02-23 (×6): 50 mg via ORAL
  Filled 2016-02-20: qty 2
  Filled 2016-02-20: qty 1
  Filled 2016-02-20 (×4): qty 2

## 2016-02-20 MED ORDER — SODIUM PHOSPHATES 45 MMOLE/15ML IV SOLN
20.0000 mmol | Freq: Once | INTRAVENOUS | Status: AC
  Administered 2016-02-20: 20 mmol via INTRAVENOUS
  Filled 2016-02-20: qty 6.67

## 2016-02-20 MED ORDER — ISOSORBIDE MONONITRATE ER 30 MG PO TB24
30.0000 mg | ORAL_TABLET | Freq: Two times a day (BID) | ORAL | Status: DC
  Administered 2016-02-20 – 2016-02-23 (×6): 30 mg via ORAL
  Filled 2016-02-20 (×6): qty 1

## 2016-02-20 MED ORDER — FUROSEMIDE 40 MG PO TABS
40.0000 mg | ORAL_TABLET | Freq: Every day | ORAL | Status: DC
  Administered 2016-02-20 – 2016-02-23 (×4): 40 mg via ORAL
  Filled 2016-02-20 (×4): qty 1

## 2016-02-20 MED ORDER — IPRATROPIUM-ALBUTEROL 0.5-2.5 (3) MG/3ML IN SOLN
3.0000 mL | Freq: Four times a day (QID) | RESPIRATORY_TRACT | Status: DC
  Administered 2016-02-20 – 2016-02-22 (×9): 3 mL via RESPIRATORY_TRACT
  Filled 2016-02-20 (×10): qty 3

## 2016-02-20 NOTE — Progress Notes (Signed)
Critical ABG results called to Delford Field, RN. BiPAP not placed on at this time due to pH within normal range and elevated HCO3. RT will continue to monitor.

## 2016-02-20 NOTE — Progress Notes (Signed)
Patient said he will not wear the BIPAP tonight because it hurts his eyes. He is switched to 10L [place] due to Sats in the lower 80's. RT will continue to monitor.

## 2016-02-20 NOTE — Progress Notes (Signed)
PULMONARY / CRITICAL CARE MEDICINE   Name: [name] MRN: [id number] DOB: [birth year]   PCP Leonard Downing, MD  ADMISSION DATE:  02/16/2016 CONSULTATION DATE:  02/16/2016  REFERRING MD:  Oren Bracket, M.D. / Anesthesia  CHIEF COMPLAINT:  Acute Respiratory Failure  BRIEF   Patient underwent right eye surgery today and intubation with 8.0 endotracheal tube. Patient was extubated in the post anesthesia care unit reportedly pulling 1200 mL tidal volumes with minimal support. Patient has persistently required oxygen at 4-6 L/m by nasal cannula to maintain saturations. PCCM was consulted by anesthesia for admission. Patient reports prior to admission he had baseline cough which was productive of a clear to yellow phlegm. Denies any subjective fever, chills, or sweats. Denies any recent sick contacts. Denies any increased dyspnea. Denies any sore throat or sinus congestion. At present the patient denies any headache. He denies any nausea or vomiting. He denies any chest pain, pressure, or tightness. He does feel as though he was breathing more comfortably on nasal cannula oxygen before BiPAP.   reports that he has been smoking Cigarettes.  He has a 5 pack-year smoking history. He uses smokeless tobacco.   MICROBIOLOGY: MRSA PCR 6/28>>>neg Blood Ctx x2 6/28>>> neg as of 02/20/2016 Sputum Ctx 6/28>>> neg as of  02/20/2016 Urine Strep Ag 6/28>>>neg Urine Legionella Ag 6/28>>>neg     ANTIBIOTICS:  Anti-infectives    Start     Dose/Rate Route Frequency Ordered Stop   02/18/16 1000  doxycycline (VIBRA-TABS) tablet 100 mg     100 mg Oral Every 12 hours 02/18/16 0925 02/23/16 0959   02/16/16 2000  doxycycline (VIBRAMYCIN) 100 mg in dextrose 5 % 250 mL IVPB  Status:  Discontinued     100 mg 125 mL/hr over 120 Minutes Intravenous Every 12 hours 02/16/16 1752 02/17/16 1102      SIGNIFICANT EVENTS: 6/28 - Presented for eye surgery 6/28 - Admitted from PACU with worsening  respiratory status 6/29 - per bedside RN 2 x intubation in OR/Pacu yesterday. Now on 80% fio2 bipap.   -> LASIX  6/30 - is off bipap since yesterday. On 3L [place]. [place] well. Feels thirsty. Acknowledges smoker but denies copd dx of osa/ . RN reporting bloody sputum   SUBJECTIVE/OVERNIGHT/INTERVAL HX Feeling better, decreased dyspnea.  Converted to NSR from A fib on Amio Wants to eat .   VITAL SIGNS: BP 135/71 mmHg  Pulse 77  Temp(Src) 98.9 F (37.2 C) (Axillary)  Resp 24  Ht 5\' 10"  (1.778 m)  Wt 369 lb (167.377 kg)  BMI 52.95 kg/m2  SpO2 97%  HEMODYNAMICS:    VENTILATOR SETTINGS: Vent Mode:  [-]  FiO2 (%):  [60 %] 60 %  INTAKE / OUTPUT: I/O last 3 completed shifts: In: 383.3 [I.V.:33.3; IV Piggyback:350] Out: 1910 [Urine:1910]  PHYSICAL EXAMINATION: General:   Obese.  Integument:  Warm & dry. No rash on exposed skin. Bruising around right eye. Lymphatics:  No appreciated cervical or supraclavicular lymphadenoapthy. HEENT:  No scleral icterus.  Cardiovascular:  Regular rate. No edema. No appreciable JVD given body habitus.  Pulmonary:  Mild crackles bilateral lung bases. Normal work of breathing  . Speaking in complete sentences. Abdomen: Soft. Normal bowel sounds. Protuberant. Grossly nontender. Musculoskeletal:  Normal bulk and tone. Hand grip strength 5/5 bilaterally. No joint deformity or effusion appreciated. Neurological: RASS +1. CAm-ICu neg for delirium. Movesl all4s. [place] slurred speech from old stroke, chronic right facial droop.  Psychiatric: not evaluable  LABS:  PULMONARY  Recent  Labs Lab 02/17/16 1108 02/17/16 1432 02/18/16 1145 02/19/16 0835 02/20/16 0632  PHART 7.203* 7.312* 7.298* 7.398 7.428  PCO2ART 92.7* 70.0* 89.6* 75.1* 65.6*  PO2ART 82.3 60.0* 58.0* 50.0* 75.6*  HCO3 35.1* 35.6* 43.8* 46.4* 42.5*  TCO2 37.9 38 46 49 44.5  O2SAT 94.7 87.0 84.0 83.0 95.6    CBC  Recent Labs Lab 02/16/16 1912 02/17/16 0540 02/17/16 1243   HGB 15.5 16.5 17.5*  HCT 52.4* 55.9* 60.3*  WBC 10.0 9.8 10.7*  PLT 183 183 156    COAGULATION  Recent Labs Lab 02/16/16 1912  INR 1.17    CARDIAC    Recent Labs Lab 02/16/16 1912 02/17/16 0540 02/17/16 1243 02/18/16 0331  TROPONINI <0.03 0.03* 0.03* 0.03*   No results for input(s): PROBNP in the last 168 hours.   CHEMISTRY  Recent Labs Lab 02/17/16 0540 02/17/16 1243 02/18/16 0331 02/19/16 0301 02/20/16 0617  NA 143 143 144 145 140  K 4.0 4.1 3.4* 2.9* 4.7  CL 104 104 97* 94* 92*  CO2 33* 34* 36* 41* 38*  GLUCOSE 117* 128* 146* 148* 143*  BUN 12 11 15  26* 29*  CREATININE 0.96 1.05 1.06 1.22 1.04  CALCIUM 8.8* 8.9 8.9 9.5 9.1  MG 2.0 2.0 1.9 2.7* 2.2  PHOS 3.3 3.2 2.5 1.1* 1.5*   Estimated Creatinine Clearance: 125.8 mL/min (by C-G formula based on Cr of 1.04).   LIVER  Recent Labs Lab 02/16/16 1912  AST 18  ALT 17  ALKPHOS 57  BILITOT 0.6  PROT 5.8*  ALBUMIN 3.1*  INR 1.17     INFECTIOUS  Recent Labs Lab 02/16/16 2001 02/17/16 0540 02/17/16 1243 02/18/16 0331  LATICACIDVEN 1.6  --   --  1.0  PROCALCITON  --  <0.10 <0.10 <0.10     ENDOCRINE CBG (last 3)   Recent Labs  02/19/16 1742 02/19/16 2201 02/20/16 0833  GLUCAP 139* 121* 153*         IMAGING x48h  - image(s) personally visualized  -   highlighted in bold Dg Chest Port 1 View  02/19/2016  CLINICAL DATA:  Respiratory failure and shortness of Breath EXAM: PORTABLE CHEST 1 VIEW COMPARISON:  02/18/2016 FINDINGS: Cardiac shadow remains enlarged. Postsurgical changes are again seen. Patchy infiltrates are again noted bilaterally slightly worse on the left than the right. Some improved aeration in the right base is noted. No bony abnormality is noted. IMPRESSION: Bilateral opacities slightly improved on the right. Electronically Signed   By: Inez Catalina M.D.   On: 02/19/2016 09:54      ASSESSMENT / PLAN:  PULMONARY A: Acute Hypoxic Respiratory Failure  Acute on  Chronic Hypercarbic Respiratory Failure - Possible OSA/OHS. CAP Chronic Tobacco Use  -  7/2 improved with Pco2 tr down   P:    BiPAP QHs - for possible OSA Cont lasix IV 40mg  bid  OOB to chair - pulm toilet needed LE duplex pending  Wean FiO2 for Sat >92% Decrease duoneb to Four times a day   Follow sputum culture Redo doxy x 5 days since 02/18/16 Outpatient PSG  CARDIOVASCULAR A:  H/O HTN H/O CAD - S/P CABG AFib -converted to NSR with Amio 7/1  Likely Acute on chronic diast chf (most recent echo May 2017). Improved with diuresis   P:  Cont lasix IV  Monitoring on telemetry Cont lopressor  Hold home  Imdur, - aim to restart 02/21/16 if clinically ok Cont amio   RENAL A:   No acute issues  but hypophos repleted by eMD  P:   Saline lock for diuresis Trending UOP Monitoring renal function & electrolytes daily Replacing electrolytes as indicated  GASTROINTESTINAL A:   NUTRITION   P:   Carb modified heart healthy diet Sips with Meds & Ice Chips  HEMATOLOGIC A:   No acute issues.  P:  Trending cell counts daily w/ CBC SCDs Holding chemical DVT ppx given recent surgery  INFECTIOUS A:   Normal PCT buthaving bloody sputum - chf v bronchitis   - improvdd 71/17   P:   Restart doxy x  5 days since 02/18/16 AWait  sputum culture 02/18/16   ENDOCRINE A:   H/O Borderline DM  P:   Accu-Checks q4hr with MD notification parameters  NEUROLOGIC A:   H/O Headache H/O CVA H/O Brain Tumor - S/P Excision. H/o eye surgery 02/16/2016  - awake - baseline slurred speech  P:   Monitor Per opthalmologt    FAMILY  - Updates: No family at bedside.  - Inter-disciplinary family meet or Palliative Care meeting due by:  7/5    GLobal - keep in ICU   Tammy Parrett NP-C  [place] [facility] and Critical [facility]  [phone]      02/20/2016 11:11 AM

## 2016-02-21 ENCOUNTER — Inpatient Hospital Stay (HOSPITAL_COMMUNITY): Payer: Medicare PPO

## 2016-02-21 DIAGNOSIS — I5033 Acute on chronic diastolic (congestive) heart failure: Secondary | ICD-10-CM

## 2016-02-21 DIAGNOSIS — R609 Edema, unspecified: Secondary | ICD-10-CM

## 2016-02-21 DIAGNOSIS — E119 Type 2 diabetes mellitus without complications: Secondary | ICD-10-CM

## 2016-02-21 DIAGNOSIS — J441 Chronic obstructive pulmonary disease with (acute) exacerbation: Secondary | ICD-10-CM

## 2016-02-21 LAB — GLUCOSE, CAPILLARY
GLUCOSE-CAPILLARY: 127 mg/dL — AB (ref 65–99)
Glucose-Capillary: 128 mg/dL — ABNORMAL HIGH (ref 65–99)
Glucose-Capillary: 121 mg/dL — ABNORMAL HIGH (ref 65–99)
Glucose-Capillary: 142 mg/dL — ABNORMAL HIGH (ref 65–99)
Glucose-Capillary: 105 mg/dL — ABNORMAL HIGH (ref 65–99)

## 2016-02-21 LAB — CULTURE, BLOOD (ROUTINE X 2)
CULTURE: NO GROWTH
CULTURE: NO GROWTH

## 2016-02-21 LAB — BASIC METABOLIC PANEL
ANION GAP: 17 — AB (ref 5–15)
BUN: 26 mg/dL — ABNORMAL HIGH (ref 6–20)
CO2: 37 mmol/L — ABNORMAL HIGH (ref 22–32)
Calcium: 10.2 mg/dL (ref 8.9–10.3)
Chloride: 89 mmol/L — ABNORMAL LOW (ref 101–111)
Creatinine, Ser: 0.94 mg/dL (ref 0.61–1.24)
GFR calc Af Amer: >60 mL/min (ref >60)
GLUCOSE: 125 mg/dL — AB (ref 65–99)
POTASSIUM: 3.1 mmol/L — AB (ref 3.5–5.1)
SODIUM: 143 mmol/L (ref 135–145)

## 2016-02-21 LAB — CBC
HCT: 54.7 % — ABNORMAL HIGH (ref 39.0–52.0)
Hemoglobin: 16.6 g/dL (ref 13.0–17.0)
MCH: 31.4 pg (ref 26.0–34.0)
MCHC: 30.3 g/dL (ref 30.0–36.0)
MCV: 103.4 fL — ABNORMAL HIGH (ref 78.0–100.0)
PLATELETS: 196 10*3/uL (ref 150–400)
RBC: 5.29 MIL/uL (ref 4.22–5.81)
RDW: 13.1 % (ref 11.5–15.5)
WBC: 13 10*3/uL — AB (ref 4.0–10.5)

## 2016-02-21 LAB — PHOSPHORUS: Phosphorus: 2.2 mg/dL — ABNORMAL LOW (ref 2.5–4.6)

## 2016-02-21 LAB — MAGNESIUM: MAGNESIUM: 1.9 mg/dL (ref 1.7–2.4)

## 2016-02-21 MED ORDER — HYDROCODONE-ACETAMINOPHEN 10-325 MG PO TABS
1.0000 | ORAL_TABLET | Freq: Four times a day (QID) | ORAL | Status: AC | PRN
  Administered 2016-02-21 – 2016-02-22 (×2): 1 via ORAL
  Filled 2016-02-21 (×3): qty 1

## 2016-02-21 MED ORDER — FAMOTIDINE 20 MG PO TABS
20.0000 mg | ORAL_TABLET | Freq: Two times a day (BID) | ORAL | Status: DC
  Administered 2016-02-21 – 2016-02-23 (×5): 20 mg via ORAL
  Filled 2016-02-21 (×5): qty 1

## 2016-02-21 MED ORDER — CHLORPROMAZINE HCL 10 MG PO TABS
10.0000 mg | ORAL_TABLET | Freq: Four times a day (QID) | ORAL | Status: DC | PRN
  Administered 2016-02-21 (×2): 10 mg via ORAL
  Filled 2016-02-21 (×3): qty 1

## 2016-02-21 MED ORDER — POTASSIUM CHLORIDE CRYS ER 20 MEQ PO TBCR
40.0000 meq | EXTENDED_RELEASE_TABLET | Freq: Once | ORAL | Status: AC
  Administered 2016-02-21: 40 meq via ORAL
  Filled 2016-02-21: qty 2

## 2016-02-21 MED ORDER — FUROSEMIDE 10 MG/ML IJ SOLN
40.0000 mg | Freq: Once | INTRAMUSCULAR | Status: AC
  Administered 2016-02-21: 40 mg via INTRAVENOUS
  Filled 2016-02-21: qty 4

## 2016-02-21 NOTE — Evaluation (Signed)
Physical Therapy Evaluation Patient Details Name: [name] MRN: [id number] DOB: 07/19/1961 Today's Date: 02/21/2016   History of Present Illness  Patient intially presented for eye surgery complicated by Acute on chronic respiratory failure.   Clinical Impression  Patient demonstrates deficits in functional mobility as indicated below. Will need continued skilled PT to address deficits and maximize function. Will see as indicated and progress as tolerated. Oxygen saturation assessment performed see pervious note for results.     Follow Up Recommendations Home health PT;Supervision for mobility/OOB    Equipment Recommendations  None recommended by PT    Recommendations for Other Services       Precautions / Restrictions Precautions Precaution Comments: watch O2 saturations Restrictions Weight Bearing Restrictions: No      Mobility  Bed Mobility Overal bed mobility: Modified Independent             General bed mobility comments: increased time and effort to perform, use of bed rail to come to EOB  Transfers Overall transfer level: Needs assistance Equipment used: 4-wheeled walker Transfers: Sit to/from Stand Sit to Stand: Supervision         General transfer comment: supervision for safety, modest impulsivity with activity  Ambulation/Gait Ambulation/Gait assistance: Supervision Ambulation Distance (Feet): 160 Feet Assistive device: 4-wheeled walker Gait Pattern/deviations: Step-through pattern;Decreased stride length;Trunk flexed;Wide base of support Gait velocity: decreased   General Gait Details: ambulating saturation assessment perform. ambulated on 6 liters with saturations 96%, decreased to 3 liters with saturations 90% decreased to 2 liters and saturations to 87%. Improved with rest and increased to 92%. When at rest in bed required increased O2 to maintin saturations ? due to positioning  Stairs            Wheelchair Mobility     Modified Rankin (Stroke Patients Only)       Balance Overall balance assessment: Needs assistance   Sitting balance-Leahy Scale: Good       Standing balance-Leahy Scale: Fair Standing balance comment: relaince on UE support                             Pertinent Vitals/Pain Pain Assessment: No/denies pain    Home Living Family/patient expects to be discharged to:: Private residence Living Arrangements: Spouse/significant other Available Help at Discharge: Family Type of Home: Mobile home Home Access: Ramped entrance     Home Layout: One level Home Equipment: Environmental consultant - 4 wheels      Prior Function Level of Independence: Needs assistance   Gait / Transfers Assistance Needed: uses rollator for mobility  ADL's / Homemaking Assistance Needed: needs assist from wife to sponge bathe        Hand Dominance   Dominant Hand: Right    Extremity/Trunk Assessment   Upper Extremity Assessment: Generalized weakness           Lower Extremity Assessment: Generalized weakness (limited ROM dur to body habitus)      Cervical / Trunk Assessment:  (increased body habitus)  Communication   Communication: Other (comment) (slurred speech at baseline 7th nerve palsy)  Cognition Arousal/Alertness: Awake/alert Behavior During Therapy: WFL for tasks assessed/performed;Impulsive (modestly impulsive) Overall Cognitive Status: Within Functional Limits for tasks assessed                      General Comments      Exercises        Assessment/Plan    PT Assessment  Patient needs continued PT services  PT Diagnosis Difficulty walking;Abnormality of gait;Generalized weakness   PT Problem List Decreased strength;Decreased activity tolerance;Decreased balance;Decreased mobility;Cardiopulmonary status limiting activity;Obesity  PT Treatment Interventions DME instruction;Gait training;Functional mobility training;Therapeutic activities;Therapeutic  exercise;Balance training;Patient/family education   PT Goals (Current goals can be found in the Care Plan section) Acute Rehab PT Goals Patient Stated Goal: to go home PT Goal Formulation: With patient Time For Goal Achievement: 03/06/16 Potential to Achieve Goals: Good    Frequency Min 3X/week   Barriers to discharge        Co-evaluation               End of Session Equipment Utilized During Treatment: Oxygen Activity Tolerance: Patient tolerated treatment well Patient left: in bed;with call bell/phone within reach Nurse Communication: Mobility status         Time: [id number] PT Time Calculation (min) (ACUTE ONLY): 22 min   Charges:   PT Evaluation $PT Eval Moderate Complexity: 1 Procedure     PT G CodesDuncan Dull 06-Mar-2016, 3:58 PM Alben Deeds, [place] DPT  [phone]

## 2016-02-21 NOTE — Care Management Important Message (Signed)
Important Message  Patient Details  Name: [name] MRN: [id number] Date of Birth: 05/07/1961   Medicare Important Message Given:  Yes    [name] 02/21/2016, 11:19 AM

## 2016-02-21 NOTE — Progress Notes (Signed)
PROGRESS NOTE  [name] [id number] DOB: [birth year] DOA: 02/16/2016 PCP: Leonard Downing, MD  HPI/Recap of past 24 hours:  Some mild wheezing, he states he wheezing all the time, on 10 liter oxygen, denies sob, no chest pain, talking in full sentences, does c/o acid reflux and hiccups, no lower extremity edema  Assessment/Plan: Active Problems:   Acute on chronic respiratory failure (HCC)   Acute on chronic respiratory failure with hypoxia and hypocapnia: from copd, diastolic chf , atelectasis and obesity hypoventilation. Wean oxygen, may need home o2, will also need sleep study if not done  Diastolic chf exacerbation: likely diastolic, cxr on 7/3 remain chf changes, will give additional IV lasix 40  Hypokalemia: replace k   microcytosis: check b12/folate/tsh  Copd exacerbation: mild wheezing on exam, he on nebs/doxycycline, he reported not able to tolerate steroids due to tachycardia side effect. Need to follow up with pulmonology  CAD s/p CABG: on atenolol, imdur, not on asa or statin at home, will check fasting lipid, likely will need statin. Need to follow up with cardiology  HTN; continue atenolol and imdur  noninsulin dependent DM2, patient denies, a1c pending, he believes insulin he is getting in the hospital makes him to have acid reflux  Smoker: cessation education provided  H/o acoustic neuroma s/p neurosurgery , chronic pain followed by pain clinic,   S/p right eye surgery on 6/28, ophthamology following  Morbid obesity: Body mass index is 50.22 kg/(m^2). life style changes, need to have sleep study   Code Status: full  Family Communication: patient   Disposition Plan: home with home health and possible home o2 in 1-2 days   Consultants:  ICU admit on 6/29,   TRH pick up from 7/3  Procedures:  Right eye surgery on 6/28  Antibiotics:  Oral doxycycline   Objective: BP 121/65 mmHg  Pulse 61  Temp(Src) 98.4 F (36.9 C) (Oral)   Resp 20  Ht 5\' 10"  (1.778 m)  Wt 158.759 kg (350 lb)  BMI 50.22 kg/m2  SpO2 96%  Intake/Output Summary (Last 24 hours) at 02/21/16 0825 Last data filed at 02/21/16 0011  Gross per 24 hour  Intake 496.67 ml  Output   1926 ml  Net -1429.33 ml   Filed Weights   02/19/16 0500 02/20/16 0500 02/21/16 0402  Weight: 167.377 kg (369 lb) 167.377 kg (369 lb) 158.759 kg (350 lb)    Exam:   General:  NAD, obese  Cardiovascular: RRR  Respiratory: mild wheezing,  Abdomen: Soft/ND/NT, positive BS  Musculoskeletal: No Edema  Neuro: aaox3, chronic right facial palsy  Data Reviewed: Basic Metabolic Panel:  Recent Labs Lab 02/17/16 1243 02/18/16 0331 02/19/16 0301 02/20/16 0617 02/21/16 0401  NA 143 144 145 140 143  K 4.1 3.4* 2.9* 4.7 3.1*  CL 104 97* 94* 92* 89*  CO2 34* 36* 41* 38* 37*  GLUCOSE 128* 146* 148* 143* 125*  BUN 11 15 26* 29* 26*  CREATININE 1.05 1.06 1.22 1.04 0.94  CALCIUM 8.9 8.9 9.5 9.1 10.2  MG 2.0 1.9 2.7* 2.2 1.9  PHOS 3.2 2.5 1.1* 1.5* 2.2*   Liver Function Tests:  Recent Labs Lab 02/16/16 1912  AST 18  ALT 17  ALKPHOS 57  BILITOT 0.6  PROT 5.8*  ALBUMIN 3.1*   No results for input(s): LIPASE, AMYLASE in the last 168 hours. No results for input(s): AMMONIA in the last 168 hours. CBC:  Recent Labs Lab 02/16/16 1912 02/17/16 0540 02/17/16 1243 02/21/16 0401  WBC  10.0 9.8 10.7* 13.0*  NEUTROABS 7.8* 7.1  --   --   HGB 15.5 16.5 17.5* 16.6  HCT 52.4* 55.9* 60.3* 54.7*  MCV 105.6* 107.3* 107.5* 103.4*  PLT 183 183 156 196   Cardiac Enzymes:    Recent Labs Lab 02/16/16 1912 02/17/16 0540 02/17/16 1243 02/18/16 0331  TROPONINI <0.03 0.03* 0.03* 0.03*   BNP (last 3 results)  Recent Labs  02/16/16 1912  BNP 85.4    ProBNP (last 3 results) No results for input(s): PROBNP in the last 8760 hours.  CBG:  Recent Labs Lab 02/20/16 1222 02/20/16 1633 02/20/16 2125 02/21/16 0358 02/21/16 0724  GLUCAP 136* 115* 118*  127* 128*    Recent Results (from the past 240 hour(s))  MRSA PCR Screening     Status: None   Collection Time: 02/16/16  6:30 PM  Result Value Ref Range Status   MRSA by PCR NEGATIVE NEGATIVE Final    Comment:        The GeneXpert MRSA Assay (FDA approved for NASAL specimens only), is one component of a comprehensive MRSA colonization surveillance program. It is not intended to diagnose MRSA infection nor to guide or monitor treatment for MRSA infections.   Culture, blood (routine x 2)     Status: None (Preliminary result)   Collection Time: 02/16/16  7:40 PM  Result Value Ref Range Status   Specimen Description BLOOD LEFT HAND  Final   Special Requests IN PEDIATRIC BOTTLE 1CC  Final   Culture NO GROWTH 4 DAYS  Final   Report Status PENDING  Incomplete  Culture, blood (routine x 2)     Status: None (Preliminary result)   Collection Time: 02/16/16  8:00 PM  Result Value Ref Range Status   Specimen Description BLOOD RIGHT HAND  Final   Special Requests BOTTLES DRAWN AEROBIC ONLY 3CC  Final   Culture NO GROWTH 4 DAYS  Final   Report Status PENDING  Incomplete  Culture, expectorated sputum-assessment     Status: None   Collection Time: 02/18/16  6:02 AM  Result Value Ref Range Status   Specimen Description SPUTUM  Final   Special Requests Normal  Final   Sputum evaluation   Final    THIS SPECIMEN IS ACCEPTABLE. RESPIRATORY CULTURE REPORT TO FOLLOW.   Report Status 02/18/2016 FINAL  Final  Culture, respiratory (NON-Expectorated)     Status: None   Collection Time: 02/18/16  6:02 AM  Result Value Ref Range Status   Specimen Description SPUTUM  Final   Special Requests NONE  Final   Gram Stain   Final    FEW WBC PRESENT,BOTH PMN AND MONONUCLEAR NO ORGANISMS SEEN    Culture Consistent with normal respiratory flora.  Final   Report Status 02/20/2016 FINAL  Final     Studies: Dg Chest Port 1 View  02/21/2016  CLINICAL DATA:  Respiratory failure, history of CHF, previous  MI, CABG, CVA, diabetes EXAM: PORTABLE CHEST 1 VIEW COMPARISON:  Portable chest x-ray of August 22, 2015 FINDINGS: The lungs are adequately inflated. There is persistent increased interstitial density bilaterally. There is subsegmental atelectasis in the mid and lower left lung and in the right infrahilar region. The cardiac silhouette is enlarged. The pulmonary vascularity is engorged. Previous median sternotomy. The lower most sternal wire is broken. IMPRESSION: CHF superimposed upon COPD. Bilateral atelectasis or pneumonia in the mid and lower lungs. No pleural effusion or pneumothorax. Electronically Signed   By: David  Martinique M.D.   On:  02/21/2016 07:30    Scheduled Meds: . antiseptic oral rinse  7 mL Mouth Rinse q12n4p  . artificial tears  1 application Right Eye QHS  . atenolol  50 mg Oral BID  . budesonide (PULMICORT) nebulizer solution  0.25 mg Nebulization BID  . chlorhexidine  15 mL Mouth Rinse BID  . doxycycline  100 mg Oral Q12H  . furosemide  40 mg Oral Daily  . insulin aspart  0-20 Units Subcutaneous TID WC  . ipratropium-albuterol  3 mL Nebulization Q6H  . isosorbide mononitrate  30 mg Oral BID  . potassium chloride  40 mEq Oral Once  . tobramycin-dexamethasone   Right Eye BID  . topiramate  200 mg Oral QHS    Continuous Infusions:    Time spent: 17mins  Malin Sambrano MD, PhD  [facility] Pager [phone]. If 7PM-7AM, please contact night-coverage at [email or web address], password [facility] 02/21/2016, 8:25 AM  LOS: 5 days

## 2016-02-21 NOTE — Progress Notes (Signed)
VASCULAR LAB PRELIMINARY  PRELIMINARY  PRELIMINARY  PRELIMINARY  Bilateral lower extremity venous duplex completed.    Preliminary report:  There is no obvious evidence of DVT or SVT noted in the visualized veins of the bilateral lower extremities.   Charie Pinkus, RVT 02/21/2016, 2:21 PM

## 2016-02-21 NOTE — Progress Notes (Signed)
Pt. States he will just wear his nasal cannula tonight, because the bipap mask hurts his eye.

## 2016-02-21 NOTE — Care Management Note (Addendum)
Case Management Note  Patient Details  Name: [name] MRN: [id number] Date of Birth: September 17, 1960  Subjective/Objective:      Pt admitted with acute on chronic resp failure              Action/Plan:  Pt PTA independent  - per pt not on home O2.  CM attempted to set pt up with sleep study - pt refused both times; stated "I don't need it, I didn't need it before I came here".  CM attempted to educate pt on his current need for supplemental oxygen in addition to recommendation for sleep study - pt has been titrated down to 7 liters however per bedside nurse pt desats into 80's whenever oxygen is removed - HFNC at 11 most of the day.  Pt continues to refused DME oxygen and appt with sleep study.  CM informed bedside nurse and paged attending.  CM will leave handoff note for holiday CM.     Expected Discharge Date:                  Expected Discharge Plan:  [place]  In-House Referral:     Discharge planning Services  CM Consult  Post Acute Care Choice:  Durable Medical Equipment Choice offered to:  Patient  DME Arranged:  Oxygen DME Agency:  [place]:    [facility] Agency:     Status of Service:  Completed, signed off  If discussed at [place] of Stay Meetings, dates discussed:    Additional Comments: 02/23/16 After indepth discussion with attending and CM - pt accepted home oxygen.  Pt given choice, pt chose [facility] - agency contacted and referral accepted - portable tank being delivered to room.  Attending aware that pt continues to refuse recommended sleep study - pt advised to follow up with PCP on recommendation.  Pt refused HH and stated he had all needed equipment at home - pts wife will transport pt home.  Pt acknowledged the recommendation for 24 hour supervision - stated "it will be taken care of".  02/21/16 CM spoke directly with attending and informed of pt refusing home O2 and Sleep Study Maryclare Labrador, RN 02/23/2016, 2:59  PM

## 2016-02-21 NOTE — Progress Notes (Signed)
SATURATION QUALIFICATIONS: (This note is used to comply with regulatory documentation for home oxygen)  Patient Saturations on Room Air at Rest = 84%  Patient Saturations on 5 liters at Rest = 95%  Patient Saturations on 6 liters while Ambulating =  6 liters with saturations 96%  Patient Saturations on 3 liters  while Ambulating =  3 liters with saturations 90%  Patient Saturations on 2 liters while Ambulating =  2 liters with saturations 87%  Patient did not ambulate on room air due to desaturations on 2 liters  Please briefly explain why patient needs home oxygen:desaturation with [place], [place] DPT  [phone]

## 2016-02-22 DIAGNOSIS — E876 Hypokalemia: Secondary | ICD-10-CM

## 2016-02-22 LAB — GLUCOSE, CAPILLARY
GLUCOSE-CAPILLARY: 123 mg/dL — AB (ref 65–99)
Glucose-Capillary: 163 mg/dL — ABNORMAL HIGH (ref 65–99)
Glucose-Capillary: 163 mg/dL — ABNORMAL HIGH (ref 65–99)
Glucose-Capillary: 140 mg/dL — ABNORMAL HIGH (ref 65–99)

## 2016-02-22 LAB — LIPID PANEL
CHOL/HDL RATIO: 5 ratio
CHOLESTEROL: 156 mg/dL (ref 0–200)
HDL: 31 mg/dL — ABNORMAL LOW (ref >40)
LDL CALC: 99 mg/dL (ref 0–99)
TRIGLYCERIDES: 129 mg/dL (ref <150)
VLDL: 26 mg/dL (ref 0–40)

## 2016-02-22 LAB — CBC
HEMATOCRIT: 53.7 % — AB (ref 39.0–52.0)
HEMOGLOBIN: 16.4 g/dL (ref 13.0–17.0)
MCH: 31.4 pg (ref 26.0–34.0)
MCHC: 30.5 g/dL (ref 30.0–36.0)
MCV: 102.7 fL — AB (ref 78.0–100.0)
Platelets: 194 10*3/uL (ref 150–400)
RBC: 5.23 MIL/uL (ref 4.22–5.81)
RDW: 12.9 % (ref 11.5–15.5)
WBC: 11.8 10*3/uL — ABNORMAL HIGH (ref 4.0–10.5)

## 2016-02-22 LAB — BASIC METABOLIC PANEL
Anion gap: 7 (ref 5–15)
BUN: 27 mg/dL — AB (ref 6–20)
CHLORIDE: 96 mmol/L — AB (ref 101–111)
CO2: 35 mmol/L — AB (ref 22–32)
Calcium: 9.2 mg/dL (ref 8.9–10.3)
Creatinine, Ser: 1.15 mg/dL (ref 0.61–1.24)
GFR calc Af Amer: >60 mL/min (ref >60)
GFR calc non Af Amer: >60 mL/min (ref >60)
GLUCOSE: 113 mg/dL — AB (ref 65–99)
POTASSIUM: 3 mmol/L — AB (ref 3.5–5.1)
SODIUM: 138 mmol/L (ref 135–145)

## 2016-02-22 LAB — VITAMIN B12: Vitamin B-12: 482 pg/mL (ref 180–914)

## 2016-02-22 LAB — TOXASSURE SELECT,+ANTIDEPR,UR: PDF: 0

## 2016-02-22 LAB — MAGNESIUM: MAGNESIUM: 2.1 mg/dL (ref 1.7–2.4)

## 2016-02-22 LAB — TSH: TSH: 0.75 u[IU]/mL (ref 0.350–4.500)

## 2016-02-22 LAB — FOLATE: FOLATE: 6.1 ng/mL (ref >5.9)

## 2016-02-22 MED ORDER — HYDROCODONE-ACETAMINOPHEN 10-325 MG PO TABS
1.0000 | ORAL_TABLET | Freq: Four times a day (QID) | ORAL | Status: AC | PRN
  Administered 2016-02-22 – 2016-02-23 (×2): 1 via ORAL
  Filled 2016-02-22 (×2): qty 1

## 2016-02-22 MED ORDER — POTASSIUM CHLORIDE CRYS ER 20 MEQ PO TBCR
40.0000 meq | EXTENDED_RELEASE_TABLET | ORAL | Status: AC
  Administered 2016-02-22 (×2): 40 meq via ORAL
  Filled 2016-02-22 (×2): qty 2

## 2016-02-22 MED ORDER — HYDROCERIN EX CREA
TOPICAL_CREAM | Freq: Two times a day (BID) | CUTANEOUS | Status: DC
  Administered 2016-02-22 – 2016-02-23 (×2): via TOPICAL
  Filled 2016-02-22 (×2): qty 113

## 2016-02-22 MED ORDER — GI COCKTAIL ~~LOC~~
30.0000 mL | Freq: Three times a day (TID) | ORAL | Status: DC | PRN
  Administered 2016-02-22 – 2016-02-23 (×3): 30 mL via ORAL
  Filled 2016-02-22 (×3): qty 30

## 2016-02-22 MED ORDER — FUROSEMIDE 10 MG/ML IJ SOLN
40.0000 mg | Freq: Once | INTRAMUSCULAR | Status: AC
  Administered 2016-02-22: 40 mg via INTRAVENOUS
  Filled 2016-02-22: qty 4

## 2016-02-22 MED ORDER — IPRATROPIUM-ALBUTEROL 0.5-2.5 (3) MG/3ML IN SOLN
3.0000 mL | Freq: Three times a day (TID) | RESPIRATORY_TRACT | Status: DC
  Administered 2016-02-23 (×2): 3 mL via RESPIRATORY_TRACT
  Filled 2016-02-22 (×2): qty 3

## 2016-02-22 NOTE — Progress Notes (Signed)
Occupational Therapy Evaluation/Discharge Patient Details Name: [name] MRN: [id number] DOB: June 05, 1961 Today's Date: 02/22/2016    History of Present Illness Patient intially presented for eye surgery complicated by Acute on chronic respiratory failure.    Clinical Impression   Patient has been evaluated by Occupational Therapy with no acute OT needs identified. Pt appears to be functioning at his baseline for ADLs and basic transfers. Pt appears to be limited by his SpO2 and endurance - pt ambulated 200' with rollator and 6L of O2 with 2 rest breaks due to desaturation (90%) and increased RR (50). Educated pt on energy conservation strategies and fall prevention. Pt with no further acute OT needs. OT signing off.    Follow Up Recommendations  No OT follow up;Supervision/Assistance - 24 hour    Equipment Recommendations  None recommended by OT    Recommendations for Other Services       Precautions / Restrictions Precautions Precautions: Fall Precaution Comments: watch O2 saturations Restrictions Weight Bearing Restrictions: No      Mobility Bed Mobility Overal bed mobility: Modified Independent             General bed mobility comments: HOB elevated, use of bedrails. Increased effort to come to EOB.  Transfers Overall transfer level: Needs assistance Equipment used: 4-wheeled walker Transfers: Sit to/from Stand Sit to Stand: Supervision         General transfer comment: Supervision for safety. Pt with some impulsivity and required cues to wait for O2 line management. Pt noted to have poor 4ww brakes and pt does not lock brakes before standing despite multiple cues. Pt ambulated 200' with 2 rest breaks due to SpO2 on 6L [place] dropping to 90% and respiration rate up to 50.     Balance Overall balance assessment: Needs assistance Sitting-balance support: No upper extremity supported;Feet supported Sitting balance-Leahy Scale: Good     Standing balance  support: Bilateral upper extremity supported;During functional activity Standing balance-Leahy Scale: Fair Standing balance comment: able to maintain balance without UE support for static standing tasks                            ADL Overall ADL's : At baseline                                       General ADL Comments: Pt sponge bathes daily with wife providing mod-max assist at baseline. Pt able to reach LB for dressing tasks independently, but is currently limited due to very long great toe nail and blisters.      Vision Additional Comments: Pt recently had surgery on R eye to correct diplopia and blurriness. Pt reports he can oly see blurry images out of R eye, but L eye is normal.   Perception     Praxis      Pertinent Vitals/Pain Pain Assessment: No/denies pain     Hand Dominance Right   Extremity/Trunk Assessment Upper Extremity Assessment Upper Extremity Assessment: Generalized weakness   Lower Extremity Assessment Lower Extremity Assessment: Generalized weakness   Cervical / Trunk Assessment Cervical / Trunk Assessment: Other exceptions Cervical / Trunk Exceptions: rounded shoulders, forward head due to body habitus   Communication Communication Communication: Other (comment) (slurred speech at baseline, 7th nerve palsy)   Cognition Arousal/Alertness: Awake/alert Behavior During Therapy: WFL for tasks assessed/performed;Impulsive Overall Cognitive Status: Within Functional Limits  for tasks assessed                     General Comments       Exercises       Shoulder Instructions      Home Living Family/patient expects to be discharged to:: Private residence Living Arrangements: Spouse/significant other Available Help at Discharge: Family;Available 24 hours/day Type of Home: Mobile home Home Access: Ramped entrance     Home Layout: One level     Bathroom Shower/Tub: Teacher, early years/pre:  Standard Bathroom Accessibility: Yes   Home Equipment: Environmental consultant - 4 wheels          Prior Functioning/Environment Level of Independence: Needs assistance  Gait / Transfers Assistance Needed: uses rollator for mobility ADL's / Homemaking Assistance Needed: needs assist from wife to sponge bathe        OT Diagnosis: Generalized weakness;Disturbance of vision   OT Problem List: Decreased strength;Decreased activity tolerance;Impaired balance (sitting and/or standing);Decreased knowledge of use of DME or AE;Decreased safety awareness;Obesity;Cardiopulmonary status limiting activity   OT Treatment/Interventions:      OT Goals(Current goals can be found in the care plan section) Acute Rehab OT Goals Patient Stated Goal: to go home OT Goal Formulation: With patient Time For Goal Achievement: 03/07/16 Potential to Achieve Goals: Good  OT Frequency:     Barriers to D/C:            Co-evaluation              End of Session Equipment Utilized During Treatment: Gait belt;Rolling walker;Oxygen Nurse Communication: Mobility status  Activity Tolerance: Patient tolerated treatment well Patient left: in chair;with call bell/phone within reach   Time: 1116-1143 OT Time Calculation (min): 27 min Charges:  OT General Charges $OT Visit: 1 Procedure OT Evaluation $OT Eval Moderate Complexity: 1 Procedure OT Treatments $Self Care/Home Management : 8-22 mins G-Codes:    Redmond Baseman, OTR/L Pager: [phone] 02/22/2016, 1:11 PM

## 2016-02-22 NOTE — Progress Notes (Addendum)
PROGRESS NOTE  [name] [id number] DOB: 08-20-1961 DOA: 02/16/2016 PCP: Leonard Downing, MD  Brief summary:  Patient underwent right eye surgery on 6/28 and intubation with 8.0 endotracheal tube. Patient was extubated in the post anesthesia care unit reportedly pulling 1200 mL tidal volumes with minimal support. Patient has persistently required oxygen at 4-6 L/m by nasal cannula to maintain saturations. PCCM was consulted by anesthesia for admission. Patient reports prior to admission he had baseline cough which was productive of a clear to yellow phlegm. Denies any subjective fever, chills, or sweats  Patient was admitted to icu and was put on bipap, he also received aggressive diuresis, he was able to wean to high flow nasal cannula and was transferred to hospitalist service on 7/3.    HPI/Recap of past 24 hours:  Feeling better, now on 6 liter oxygen ( was on 10liter yesterday),  c/o acid reflux and hiccups, no lower extremity edema, no fever  Assessment/Plan: Active Problems:   Acute on chronic respiratory failure (HCC)   Acute on chronic respiratory failure with hypoxia and hypocapnia: from copd, diastolic chf , atelectasis and obesity hypoventilation. Wean oxygen, may need home o2, will also need sleep study if not done  Diastolic chf exacerbation: likely diastolic, cxr on 7/3 remain chf changes, will give additional IV lasix 40mg  on 7/3 and 7/4 in addition to scheduled oral daily lasix  Hypokalemia: replace k  Copd exacerbation: mild wheezing on exam on 7/3, no wheezing on 7/4, he on nebs/doxycycline, he reported not able to tolerate steroids due to tachycardia side effect. Need to follow up with pulmonology  CAD s/p CABG: on atenolol, imdur, not on asa or statin at home, will check fasting lipid, likely will need statin. Need to follow up with cardiology  HTN; continue atenolol and imdur  noninsulin dependent DM2, patient denies having diabetes, a1c  pending, he believes insulin he is getting in the hospital makes him to have acid reflux  microcytosis: mvc 102.7, b12/folate/tsh all wnl  Smoker: cessation education provided  H/o acoustic neuroma s/p neurosurgery , chronic facial palsy, chronic pain followed by pain clinic,   S/p right eye surgery on 6/28, ophthamology following  Morbid obesity: Body mass index is 49.5 kg/(m^2). life style changes, need to have sleep study   Code Status: full  Family Communication: patient   Disposition Plan: home with home health and possible home o2 in 1-2 days   Consultants:  ICU admit on 6/29,   TRH pick up from 7/3  Procedures:  Right eye surgery on 6/28  Antibiotics:  Oral doxycycline   Objective: BP 110/74 mmHg  Pulse 67  Temp(Src) 98.5 F (36.9 C) (Oral)  Resp 19  Ht 5\' 10"  (1.778 m)  Wt 156.491 kg (345 lb)  BMI 49.50 kg/m2  SpO2 94%  Intake/Output Summary (Last 24 hours) at 02/22/16 1024 Last data filed at 02/22/16 0500  Gross per 24 hour  Intake   1680 ml  Output   1850 ml  Net   -170 ml   Filed Weights   02/20/16 0500 02/21/16 0402 02/22/16 0500  Weight: 167.377 kg (369 lb) 158.759 kg (350 lb) 156.491 kg (345 lb)    Exam:   General:  NAD, obese  Cardiovascular: RRR  Respiratory: mild wheezing has resolved, diminished at basis  Abdomen: Soft/ND/NT, positive BS  Musculoskeletal: No Edema  Neuro: aaox3, chronic right facial palsy  Data Reviewed: Basic Metabolic Panel:  Recent Labs Lab 02/17/16 1243 02/18/16 0331 02/19/16 0301  02/20/16 0617 02/21/16 0401 02/22/16 0342  NA 143 144 145 140 143 138  K 4.1 3.4* 2.9* 4.7 3.1* 3.0*  CL 104 97* 94* 92* 89* 96*  CO2 34* 36* 41* 38* 37* 35*  GLUCOSE 128* 146* 148* 143* 125* 113*  BUN 11 15 26* 29* 26* 27*  CREATININE 1.05 1.06 1.22 1.04 0.94 1.15  CALCIUM 8.9 8.9 9.5 9.1 10.2 9.2  MG 2.0 1.9 2.7* 2.2 1.9 2.1  PHOS 3.2 2.5 1.1* 1.5* 2.2*  --    Liver Function Tests:  Recent Labs Lab  02/16/16 1912  AST 18  ALT 17  ALKPHOS 57  BILITOT 0.6  PROT 5.8*  ALBUMIN 3.1*   No results for input(s): LIPASE, AMYLASE in the last 168 hours. No results for input(s): AMMONIA in the last 168 hours. CBC:  Recent Labs Lab 02/16/16 1912 02/17/16 0540 02/17/16 1243 02/21/16 0401 02/22/16 0342  WBC 10.0 9.8 10.7* 13.0* 11.8*  NEUTROABS 7.8* 7.1  --   --   --   HGB 15.5 16.5 17.5* 16.6 16.4  HCT 52.4* 55.9* 60.3* 54.7* 53.7*  MCV 105.6* 107.3* 107.5* 103.4* 102.7*  PLT 183 183 156 196 194   Cardiac Enzymes:    Recent Labs Lab 02/16/16 1912 02/17/16 0540 02/17/16 1243 02/18/16 0331  TROPONINI <0.03 0.03* 0.03* 0.03*   BNP (last 3 results)  Recent Labs  02/16/16 1912  BNP 85.4    ProBNP (last 3 results) No results for input(s): PROBNP in the last 8760 hours.  CBG:  Recent Labs Lab 02/21/16 0724 02/21/16 1114 02/21/16 1606 02/21/16 2122 02/22/16 0734  GLUCAP 128* 121* 142* 105* 163*    Recent Results (from the past 240 hour(s))  MRSA PCR Screening     Status: None   Collection Time: 02/16/16  6:30 PM  Result Value Ref Range Status   MRSA by PCR NEGATIVE NEGATIVE Final    Comment:        The GeneXpert MRSA Assay (FDA approved for NASAL specimens only), is one component of a comprehensive MRSA colonization surveillance program. It is not intended to diagnose MRSA infection nor to guide or monitor treatment for MRSA infections.   Culture, blood (routine x 2)     Status: None   Collection Time: 02/16/16  7:40 PM  Result Value Ref Range Status   Specimen Description BLOOD LEFT HAND  Final   Special Requests IN PEDIATRIC BOTTLE 1CC  Final   Culture NO GROWTH 5 DAYS  Final   Report Status 02/21/2016 FINAL  Final  Culture, blood (routine x 2)     Status: None   Collection Time: 02/16/16  8:00 PM  Result Value Ref Range Status   Specimen Description BLOOD RIGHT HAND  Final   Special Requests BOTTLES DRAWN AEROBIC ONLY 3CC  Final   Culture NO  GROWTH 5 DAYS  Final   Report Status 02/21/2016 FINAL  Final  Culture, expectorated sputum-assessment     Status: None   Collection Time: 02/18/16  6:02 AM  Result Value Ref Range Status   Specimen Description SPUTUM  Final   Special Requests Normal  Final   Sputum evaluation   Final    THIS SPECIMEN IS ACCEPTABLE. RESPIRATORY CULTURE REPORT TO FOLLOW.   Report Status 02/18/2016 FINAL  Final  Culture, respiratory (NON-Expectorated)     Status: None   Collection Time: 02/18/16  6:02 AM  Result Value Ref Range Status   Specimen Description SPUTUM  Final   Special Requests  NONE  Final   Gram Stain   Final    FEW WBC PRESENT,BOTH PMN AND MONONUCLEAR NO ORGANISMS SEEN    Culture Consistent with normal respiratory flora.  Final   Report Status 02/20/2016 FINAL  Final     Studies: No results found.  Scheduled Meds: . antiseptic oral rinse  7 mL Mouth Rinse q12n4p  . artificial tears  1 application Right Eye QHS  . atenolol  50 mg Oral BID  . budesonide (PULMICORT) nebulizer solution  0.25 mg Nebulization BID  . chlorhexidine  15 mL Mouth Rinse BID  . doxycycline  100 mg Oral Q12H  . famotidine  20 mg Oral BID  . furosemide  40 mg Intravenous Once  . furosemide  40 mg Oral Daily  . insulin aspart  0-20 Units Subcutaneous TID WC  . ipratropium-albuterol  3 mL Nebulization Q6H  . isosorbide mononitrate  30 mg Oral BID  . potassium chloride  40 mEq Oral Q4H  . tobramycin-dexamethasone   Right Eye BID  . topiramate  200 mg Oral QHS    Continuous Infusions:    Time spent: 6mins  Taisa Deloria MD, PhD  [facility] Pager [phone]. If 7PM-7AM, please contact night-coverage at [email or web address], password [facility] 02/22/2016, 10:24 AM  LOS: 6 days

## 2016-02-23 DIAGNOSIS — G4733 Obstructive sleep apnea (adult) (pediatric): Secondary | ICD-10-CM

## 2016-02-23 LAB — CBC
HEMATOCRIT: 53.8 % — AB (ref 39.0–52.0)
HEMOGLOBIN: 16.3 g/dL (ref 13.0–17.0)
MCH: 31.1 pg (ref 26.0–34.0)
MCHC: 30.3 g/dL (ref 30.0–36.0)
MCV: 102.7 fL — AB (ref 78.0–100.0)
Platelets: 186 10*3/uL (ref 150–400)
RBC: 5.24 MIL/uL (ref 4.22–5.81)
RDW: 12.9 % (ref 11.5–15.5)
WBC: 10.5 10*3/uL (ref 4.0–10.5)

## 2016-02-23 LAB — BASIC METABOLIC PANEL
Anion gap: 8 (ref 5–15)
BUN: 27 mg/dL — AB (ref 6–20)
CHLORIDE: 98 mmol/L — AB (ref 101–111)
CO2: 36 mmol/L — AB (ref 22–32)
Calcium: 9.5 mg/dL (ref 8.9–10.3)
Creatinine, Ser: 1.12 mg/dL (ref 0.61–1.24)
GFR calc Af Amer: >60 mL/min (ref >60)
GFR calc non Af Amer: >60 mL/min (ref >60)
GLUCOSE: 116 mg/dL — AB (ref 65–99)
POTASSIUM: 3.2 mmol/L — AB (ref 3.5–5.1)
Sodium: 142 mmol/L (ref 135–145)

## 2016-02-23 LAB — HEMOGLOBIN A1C
Hgb A1c MFr Bld: 5.6 % (ref 4.8–5.6)
Mean Plasma Glucose: 114 mg/dL

## 2016-02-23 LAB — GLUCOSE, CAPILLARY
Glucose-Capillary: 125 mg/dL — ABNORMAL HIGH (ref 65–99)
Glucose-Capillary: 145 mg/dL — ABNORMAL HIGH (ref 65–99)
Glucose-Capillary: 127 mg/dL — ABNORMAL HIGH (ref 65–99)

## 2016-02-23 LAB — MAGNESIUM: Magnesium: 2 mg/dL (ref 1.7–2.4)

## 2016-02-23 MED ORDER — SUCRALFATE 1 G PO TABS: 1.0000 g | ORAL_TABLET | Freq: Three times a day (TID) | ORAL | Status: DC

## 2016-02-23 MED ORDER — FUROSEMIDE 40 MG PO TABS: 40.0000 mg | ORAL_TABLET | Freq: Every day | ORAL | Status: DC

## 2016-02-23 MED ORDER — TOBRAMYCIN-DEXAMETHASONE 0.3-0.1 % OP OINT: 1.0000 "application " | TOPICAL_OINTMENT | Freq: Two times a day (BID) | OPHTHALMIC | Status: DC

## 2016-02-23 NOTE — Progress Notes (Signed)
Physical Therapy Treatment Patient Details Name: [name] MRN: [id number] DOB: 04-30-1961 Today's Date: 02/23/2016    History of Present Illness Patient intially presented for eye surgery complicated by Acute on chronic respiratory failure.     PT Comments    Patient progressing well towards PT goals. Continues to fatigue with mobility requiring standing rest breaks but feeling stronger today. Continues to require supplemental oxygen during ambulation. SP02 ranged from 85-90% on 3L/min 02. Encouraged short bouts of activity throughout the day to improve endurance and utilization of oxygen. Will follow.   Follow Up Recommendations  Home health PT;Supervision for mobility/OOB     Equipment Recommendations  Other (comment) (home 02)    Recommendations for Other Services       Precautions / Restrictions Precautions Precautions: Fall Precaution Comments: watch O2 saturations Restrictions Weight Bearing Restrictions: No    Mobility  Bed Mobility               General bed mobility comments: up in chair upon PT arrival.   Transfers Overall transfer level: Needs assistance Equipment used: 4-wheeled walker Transfers: Sit to/from Stand Sit to Stand: Supervision         General transfer comment: Supervision for safety. Stood from Youth worker.  Ambulation/Gait Ambulation/Gait assistance: Supervision Ambulation Distance (Feet): 250 Feet Assistive device: 4-wheeled walker Gait Pattern/deviations: Step-through pattern;Decreased stride length;Trunk flexed;Wide base of support Gait velocity: decreased   General Gait Details: Slow, steady gait with forward flexed posture. 3 standing rest breaks. Ambulated on 3L/min 02 and Sp02 ranged from 86-90%. RR up to 40-50.   Stairs            Wheelchair Mobility    Modified Rankin (Stroke Patients Only)       Balance Overall balance assessment: Needs assistance Sitting-balance support: Feet supported;No upper  extremity supported Sitting balance-Leahy Scale: Good     Standing balance support: During functional activity Standing balance-Leahy Scale: Fair                      Cognition Arousal/Alertness: Awake/alert Behavior During Therapy: WFL for tasks assessed/performed Overall Cognitive Status: Within Functional Limits for tasks assessed                      Exercises      General Comments        Pertinent Vitals/Pain Pain Assessment: Faces Faces Pain Scale: Hurts a little bit Pain Location: chronic back pain Pain Descriptors / Indicators: Sore;Aching Pain Intervention(s): Monitored during session    Home Living                      Prior Function            PT Goals (current goals can now be found in the care plan section) Progress towards PT goals: Progressing toward goals    Frequency  Min 3X/week    PT Plan Current plan remains appropriate    Co-evaluation             End of Session Equipment Utilized During Treatment: Oxygen Activity Tolerance: Treatment limited secondary to medical complications (Comment);Patient tolerated treatment well (drop in Sp02 with mobility.) Patient left: in chair;with call bell/phone within reach     Time: 1340-1404 PT Time Calculation (min) (ACUTE ONLY): 24 min  Charges:  $Gait Training: 23-37 mins  G Codes:      Tonae Livolsi A Babe Anthis 02/23/2016, 3:04 PM Wray Kearns, [place], DPT [phone]

## 2016-02-23 NOTE — Discharge Summary (Addendum)
Physician Discharge Summary  [name] [id number] DOB: March 29, 1961 DOA: 02/16/2016  PCP: Leonard Downing, MD  Admit date: 02/16/2016 Discharge date: 02/23/2016  Time spent: 35 minutes  Recommendations for Outpatient Follow-up:  1. Please follow-up on respiratory status, he was discharged on home oxygen 3 L via nasal cannula 2. Follow-up on volume status, he was treated for acute respiratory failure secondary to diastolic congestive heart failure. Discharged on Lasix 40 mg by mouth daily 3. Status post right eye surgery on 02/16/2016   Discharge Diagnoses:  Active Problems:   Acute on chronic respiratory failure [facility])   Discharge Condition: Stable  Diet recommendation: Heart healthy  Filed Weights   02/21/16 0402 02/22/16 0500 02/23/16 0355  Weight: 158.759 kg (350 lb) 156.491 kg (345 lb) 157.625 kg (347 lb 8 oz)    History of present illness:  Patient underwent right eye surgery today and intubation with 8.0 endotracheal tube. Patient was extubated in the post anesthesia care unit reportedly pulling 1200 mL tidal volumes with minimal support. Patient has persistently required oxygen at 4-6 L/m by nasal cannula to maintain saturations. PCCM was consulted by anesthesia for admission. Patient reports prior to admission he had baseline cough which was productive of a clear to yellow phlegm. Denies any subjective fever, chills, or sweats. Denies any recent sick contacts. Denies any increased dyspnea. Denies any sore throat or sinus congestion. At present the patient denies any headache. He denies any nausea or vomiting. He denies any chest pain, pressure, or tightness. He does feel as though he was breathing more comfortably on nasal cannula oxygen before BiPAP.  Hospital Course:  Mr. Tkachenko is a 55 year old gentleman with a past medical history of CVA, diabetes, diastolic congestive heart failure, morbid obesity, who underwent right eye surgery on 02/16/2016 for esotropia  diplopia ectropion of right eye, procedure was performed by Dr. Frederico Hamman. Postoperatively he went into acute hypoxemic hypercarbic respiratory failure and was admitted to the intensive care unit under the care of the pulmonary critical care medicine service. His respiratory status further deteriorated and underwent rapid sequence intubation on 02/16/2016. Suspect that acute hypoxemic respiratory failure was secondary to acute diastolic congestive heart failure and possibly obstructive sleep apnea/obesity hypoventilation syndrome. He was treated with IV Lasix as well as antibiotic therapy with doxycycline. He diuresed well and showed clinical improvement. His last transthoracic echocardiogram had been done on 01/07/2016 that showed a preserved ejection fraction 55-60%. By 02/23/2016 he had a net negative fluid balance of 8.4 L. His oxygen requirement had come down to 3 L nasal cannula. Showing significant clinical improvement he was discharged to his home in stable condition on 02/23/2016. Prior to discharge case manager was consulted to assist with home oxygen. He was instructed to follow-up with his ophthalmologist in the next week.  Procedures: -ADJUSTABLE SUTURE RIGHT LATERAL RECTUS RIGHT SUPERIOR RECTUS LATERAL TARSAL STRIP RIGHT EYE  -Rapid sequence intubation performed on 02/16/2016  Consultations:  Pulmonary critical care medicine  Discharge Exam: Filed Vitals:   02/23/16 1215 02/23/16 1653  BP: 124/62 118/64  Pulse: 90 91  Temp: 98.3 F (36.8 C) 98.4 F (36.9 C)  Resp: 19 17    General: NAD, obese  Cardiovascular: RRR  Respiratory: Denies result bilaterally, no wheezing rhonchi or rales  Abdomen: Soft/ND/NT, positive BS  Musculoskeletal: No Edema  Neuro: aaox3, chronic right facial palsy  Discharge Instructions   Discharge Instructions    (HEART FAILURE PATIENTS) Call MD:  Anytime you have any of the following symptoms: 1)  3 pound weight gain in 24 hours or 5 pounds in 1  week 2) shortness of breath, with or without a dry hacking cough 3) swelling in the hands, feet or stomach 4) if you have to sleep on extra pillows at night in order to breathe.    Complete by:  As directed      Activity as tolerated    Complete by:  As directed      Call MD for:  difficulty breathing, headache or visual disturbances    Complete by:  As directed      Call MD for:  difficulty breathing, headache or visual disturbances    Complete by:  As directed      Call MD for:  extreme fatigue    Complete by:  As directed      Call MD for:  extreme fatigue    Complete by:  As directed      Call MD for:  hives    Complete by:  As directed      Call MD for:  hives    Complete by:  As directed      Call MD for:  persistant dizziness or light-headedness    Complete by:  As directed      Call MD for:  persistant dizziness or light-headedness    Complete by:  As directed      Call MD for:  persistant nausea and vomiting    Complete by:  As directed      Call MD for:  persistant nausea and vomiting    Complete by:  As directed      Call MD for:  redness, tenderness, or signs of infection (pain, swelling, redness, odor or green/yellow discharge around incision site)    Complete by:  As directed      Call MD for:  redness, tenderness, or signs of infection (pain, swelling, redness, odor or green/yellow discharge around incision site)    Complete by:  As directed      Call MD for:  severe uncontrolled pain    Complete by:  As directed      Call MD for:  severe uncontrolled pain    Complete by:  As directed      Call MD for:  temperature >100.4    Complete by:  As directed      Call MD for:  temperature >100.4    Complete by:  As directed      Call MD for:    Complete by:  As directed      Call MD for:    Complete by:  As directed      Diet - low sodium heart healthy    Complete by:  As directed      Diet - low sodium heart healthy    Complete by:  As directed      Discharge diet:     Complete by:  As directed   Advance to in home diet as tolerated : regular)     Discharge instructions    Complete by:  As directed   Ice packs to OD and R periorbita Q 15 mins as tolerated.Marland Kitchen Keep R side of face clean and dry x 24 hours Advance to PO as tolerated . D/C IVF per critical care unit protocol     Increase activity slowly    Complete by:  As directed      Increase activity slowly    Complete by:  As directed  Current Discharge Medication List    START taking these medications   Details  sucralfate (CARAFATE) 1 g tablet Take 1 tablet (1 g total) by mouth 4 (four) times daily -  with meals and at bedtime. Qty: 30 tablet, Refills: 0    tobramycin-dexamethasone (TOBRADEX) ophthalmic ointment Place 1 application into the right eye 2 (two) times daily at 10 am and 4 pm. Qty: 3.5 g, Refills: 0      CONTINUE these medications which have CHANGED   Details  furosemide (LASIX) 40 MG tablet Take 1 tablet (40 mg total) by mouth daily. Qty: 30 tablet, Refills: 1      CONTINUE these medications which have NOT CHANGED   Details  albuterol (PROVENTIL HFA;VENTOLIN HFA) 108 (90 BASE) MCG/ACT inhaler Inhale 2 puffs into the lungs every 6 (six) hours as needed for wheezing or shortness of breath. Qty: 1 Inhaler, Refills: 1    artificial tears (LACRILUBE) OINT ophthalmic ointment Place 1 application into the right eye at bedtime.     atenolol (TENORMIN) 50 MG tablet Take 1 tablet (50 mg total) by mouth 2 (two) times daily. Note increased dose. Qty: 60 tablet, Refills: 1    colchicine 0.6 MG tablet Take 1 tablet (0.6 mg total) by mouth daily. Qty: 30 tablet, Refills: 1    HYDROcodone-acetaminophen (NORCO) 10-325 MG tablet TAKE 1 TABLET BY MOUTH EVERY 6 HOURS AS NEEDED FOR PAIN MUST LAST 30 DAYS Qty: 120 tablet, Refills: 0   Associated Diagnoses: Status post craniotomy; Acoustic neuroma ([place]); Facial nerve palsy; Headache due to intracranial disease; Facial nerve  injury, right, sequela    isosorbide mononitrate (IMDUR) 30 MG 24 hr tablet Take 30 mg by mouth 2 (two) times daily.    nystatin (MYCOSTATIN/NYSTOP) 100000 UNIT/GM POWD Apply 1 g topically 2 (two) times daily as needed. 90 day supply Qty: 180 g, Refills: 3   Associated Diagnoses: Morbid obesity ([place]); Fungus infection    Skin Protectants, Misc. (EUCERIN) cream Apply 1 application topically 3 (three) times daily.    tiZANidine (ZANAFLEX) 4 MG tablet Take 1 tablet (4 mg total) by mouth 3 (three) times daily as needed for muscle spasms. 90 day supply Qty: 270 tablet, Refills: 2   Associated Diagnoses: Chronic pain syndrome    topiramate (TOPAMAX) 100 MG tablet Take 2 tablets (200 mg total) by mouth at bedtime. 3 month rx Qty: 180 tablet, Refills: 5   Associated Diagnoses: Status post craniotomy; Acoustic neuroma ([place]); Facial nerve palsy; Headache due to intracranial disease    antiseptic oral rinse (BIOTENE) LIQD 15 mLs by Mouth Rinse route as needed for dry mouth.     benzocaine (ORAJEL) 10 % mucosal gel Use as directed 1 application in the mouth or throat 2 (two) times daily as needed for pain.      STOP taking these medications     neomycin-polymyxin b-dexamethasone (MAXITROL) 3.5-10000-0.1 SUSP        Allergies  Allergen Reactions  . Morphine And Related Anaphylaxis    "makes me stop breathing"  . Ivp Dye [Iodinated Diagnostic Agents]     Passing out  . Other     Steroids makes hearts race   Follow-up Information    Follow up with SPENCER,MICHAEL A, MD In 1 week.   Specialty:  Ophthalmology   Contact information:   [place] [address] [phone]       Follow up with Leonard Downing, MD In 1 week.   Specialty:  Family Medicine   Why:  hospital discharge follow up, repeat cbc/bmp/mag/phos at follow up, pmd to refer pateint to have sleep study if not already done.    Contact information:   [place]  [address] [phone]       Follow up with Quay Burow, MD In 3 weeks.   Specialties:  Cardiology, Radiology   Contact information:   [address] [place] [address] [phone]       Follow up with Leonard Downing, MD In 1 week.   Specialty:  Family Medicine   Contact information:   [place] [address] [phone]       Follow up with [place].   Why:  oxygen   Contact information:   [address] [address] [phone]        The results of significant diagnostics from this hospitalization (including imaging, microbiology, ancillary and laboratory) are listed below for reference.    Significant Diagnostic Studies: Dg Chest Port 1 View  02/21/2016  CLINICAL DATA:  Respiratory failure, history of CHF, previous MI, CABG, CVA, diabetes EXAM: PORTABLE CHEST 1 VIEW COMPARISON:  Portable chest x-ray of August 22, 2015 FINDINGS: The lungs are adequately inflated. There is persistent increased interstitial density bilaterally. There is subsegmental atelectasis in the mid and lower left lung and in the right infrahilar region. The cardiac silhouette is enlarged. The pulmonary vascularity is engorged. Previous median sternotomy. The lower most sternal wire is broken. IMPRESSION: CHF superimposed upon COPD. Bilateral atelectasis or pneumonia in the mid and lower lungs. No pleural effusion or pneumothorax. Electronically Signed   By: David  Martinique M.D.   On: 02/21/2016 07:30   Dg Chest Port 1 View  02/19/2016  CLINICAL DATA:  Respiratory failure and shortness of Breath EXAM: PORTABLE CHEST 1 VIEW COMPARISON:  02/18/2016 FINDINGS: Cardiac shadow remains enlarged. Postsurgical changes are again seen. Patchy infiltrates are again noted bilaterally slightly worse on the left than the right. Some improved aeration in the right base is noted. No bony abnormality is noted. IMPRESSION: Bilateral opacities slightly  improved on the right. Electronically Signed   By: Inez Catalina M.D.   On: 02/19/2016 09:54   Dg Chest Port 1 View  02/18/2016  CLINICAL DATA:  Acute respiratory failure. EXAM: PORTABLE CHEST 1 VIEW COMPARISON:  02/17/2016 FINDINGS: Prior median sternotomy. Cardiomegaly. Bilateral airspace opacities are again noted which could represent edema or infection. No definite visible effusions or acute bony abnormality. IMPRESSION: Continued bilateral airspace opacities, not significantly changed, likely edema or infection. Electronically Signed   By: Rolm Baptise M.D.   On: 02/18/2016 10:24   Dg Chest Port 1 View  02/17/2016  CLINICAL DATA:  Acute respiratory failure. EXAM: PORTABLE CHEST 1 VIEW COMPARISON:  02/16/2016 FINDINGS: Status post median sternotomy and CABG. The heart is enlarged. Patchy infiltrates are identified the lung bases bilaterally, increased since prior study. IMPRESSION: Increased bilateral infiltrates. Cardiomegaly. Electronically Signed   By: Nolon Nations M.D.   On: 02/17/2016 13:03   Dg Chest Port 1 View  02/16/2016  CLINICAL DATA:  Shortness of Breath EXAM: PORTABLE CHEST 1 VIEW COMPARISON:  11/20/2012 FINDINGS: Cardiomegaly again noted. Central mild vascular congestion without convincing pulmonary edema. Status post median sternotomy. There is streaky atelectasis or infiltrate in left midlung and right base. IMPRESSION: Cardiomegaly. Status post median sternotomy. Streaky atelectasis or infiltrate left midlung and right base. Electronically Signed   By:  Lahoma Crocker M.D.   On: 02/16/2016 16:56    Microbiology: Recent Results (from the past 240 hour(s))  MRSA PCR Screening     Status: None   Collection Time: 02/16/16  6:30 PM  Result Value Ref Range Status   MRSA by PCR NEGATIVE NEGATIVE Final    Comment:        The GeneXpert MRSA Assay (FDA approved for NASAL specimens only), is one component of a comprehensive MRSA colonization surveillance program. It is not intended to  diagnose MRSA infection nor to guide or monitor treatment for MRSA infections.   Culture, blood (routine x 2)     Status: None   Collection Time: 02/16/16  7:40 PM  Result Value Ref Range Status   Specimen Description BLOOD LEFT HAND  Final   Special Requests IN PEDIATRIC BOTTLE 1CC  Final   Culture NO GROWTH 5 DAYS  Final   Report Status 02/21/2016 FINAL  Final  Culture, blood (routine x 2)     Status: None   Collection Time: 02/16/16  8:00 PM  Result Value Ref Range Status   Specimen Description BLOOD RIGHT HAND  Final   Special Requests BOTTLES DRAWN AEROBIC ONLY 3CC  Final   Culture NO GROWTH 5 DAYS  Final   Report Status 02/21/2016 FINAL  Final  Culture, expectorated sputum-assessment     Status: None   Collection Time: 02/18/16  6:02 AM  Result Value Ref Range Status   Specimen Description SPUTUM  Final   Special Requests Normal  Final   Sputum evaluation   Final    THIS SPECIMEN IS ACCEPTABLE. RESPIRATORY CULTURE REPORT TO FOLLOW.   Report Status 02/18/2016 FINAL  Final  Culture, respiratory (NON-Expectorated)     Status: None   Collection Time: 02/18/16  6:02 AM  Result Value Ref Range Status   Specimen Description SPUTUM  Final   Special Requests NONE  Final   Gram Stain   Final    FEW WBC PRESENT,BOTH PMN AND MONONUCLEAR NO ORGANISMS SEEN    Culture Consistent with normal respiratory flora.  Final   Report Status 02/20/2016 FINAL  Final     Labs: Basic Metabolic Panel:  Recent Labs Lab 02/17/16 1243 02/18/16 0331 02/19/16 0301 02/20/16 0617 02/21/16 0401 02/22/16 0342 02/23/16 0427  NA 143 144 145 140 143 138 142  K 4.1 3.4* 2.9* 4.7 3.1* 3.0* 3.2*  CL 104 97* 94* 92* 89* 96* 98*  CO2 34* 36* 41* 38* 37* 35* 36*  GLUCOSE 128* 146* 148* 143* 125* 113* 116*  BUN 11 15 26* 29* 26* 27* 27*  CREATININE 1.05 1.06 1.22 1.04 0.94 1.15 1.12  CALCIUM 8.9 8.9 9.5 9.1 10.2 9.2 9.5  MG 2.0 1.9 2.7* 2.2 1.9 2.1 2.0  PHOS 3.2 2.5 1.1* 1.5* 2.2*  --   --     Liver Function Tests:  Recent Labs Lab 02/16/16 1912  AST 18  ALT 17  ALKPHOS 57  BILITOT 0.6  PROT 5.8*  ALBUMIN 3.1*   No results for input(s): LIPASE, AMYLASE in the last 168 hours. No results for input(s): AMMONIA in the last 168 hours. CBC:  Recent Labs Lab 02/16/16 1912 02/17/16 0540 02/17/16 1243 02/21/16 0401 02/22/16 0342 02/23/16 0427  WBC 10.0 9.8 10.7* 13.0* 11.8* 10.5  NEUTROABS 7.8* 7.1  --   --   --   --   HGB 15.5 16.5 17.5* 16.6 16.4 16.3  HCT 52.4* 55.9* 60.3* 54.7* 53.7* 53.8*  MCV  105.6* 107.3* 107.5* 103.4* 102.7* 102.7*  PLT 183 183 156 196 194 186   Cardiac Enzymes:  Recent Labs Lab 02/16/16 1912 02/17/16 0540 02/17/16 1243 02/18/16 0331  TROPONINI <0.03 0.03* 0.03* 0.03*   BNP: BNP (last 3 results)  Recent Labs  02/16/16 1912  BNP 85.4    ProBNP (last 3 results) No results for input(s): PROBNP in the last 8760 hours.  CBG:  Recent Labs Lab 02/22/16 1629 02/22/16 1952 02/23/16 0731 02/23/16 1116 02/23/16 1621  GLUCAP 163* 140* 125* 145* 127*       Signed:  Kelvin Cellar MD.  [facility] 02/23/2016, 5:07 PM

## 2016-02-24 ENCOUNTER — Other Ambulatory Visit: Payer: Self-pay | Admitting: Physical Medicine & Rehabilitation

## 2016-02-25 NOTE — Progress Notes (Signed)
Urine drug screen for this encounter is consistent for prescribed medications.   

## 2016-02-25 NOTE — Telephone Encounter (Signed)
Please advise on refill.

## 2016-03-02 ENCOUNTER — Telehealth: Payer: Self-pay | Admitting: Physical Medicine & Rehabilitation

## 2016-03-02 NOTE — Telephone Encounter (Signed)
Patient keeps getting calls from [facility] about his Nystatin.  He needs someone from our office to call them to reinstate it.  Please call patient with any questions [phone].

## 2016-03-02 NOTE — Telephone Encounter (Signed)
I advised [name] we do not refill antifungal medications.  This was ordered by Dr Naaman Plummer last year in August as courtesy but we do not maintain treatment for fungal infections.  He will need to see his primary care to see if he still needs treatment and if something else would be more appropriate.

## 2016-03-06 ENCOUNTER — Other Ambulatory Visit: Payer: Self-pay | Admitting: Physical Medicine & Rehabilitation

## 2016-03-06 ENCOUNTER — Other Ambulatory Visit: Payer: Self-pay | Admitting: Registered Nurse

## 2016-03-06 DIAGNOSIS — B49 Unspecified mycosis: Secondary | ICD-10-CM

## 2016-03-06 NOTE — Telephone Encounter (Signed)
Refusal sent to [facility]  I explained this to Mr Swierk.  He needs to be seen by his primary for this medication.

## 2016-03-06 NOTE — Telephone Encounter (Signed)
[facility] is calling to verify nystatin powder written for patient - please call [phone] to verify

## 2016-03-07 ENCOUNTER — Telehealth: Payer: Self-pay | Admitting: Physical Medicine & Rehabilitation

## 2016-03-07 DIAGNOSIS — B49 Unspecified mycosis: Secondary | ICD-10-CM

## 2016-03-07 NOTE — Telephone Encounter (Signed)
Routing to [employer]

## 2016-03-07 NOTE — Telephone Encounter (Signed)
Patient is needing Danella Sensing to call him back.

## 2016-03-08 MED ORDER — NYSTATIN 100000 UNIT/GM EX POWD: 1.0000 g | Freq: Two times a day (BID) | CUTANEOUS | Status: DC | PRN

## 2016-03-08 NOTE — Telephone Encounter (Signed)
Return [name] call, questions answered. He verbalizes understanding.

## 2016-04-17 ENCOUNTER — Encounter: Payer: Self-pay | Admitting: Registered Nurse

## 2016-04-17 ENCOUNTER — Encounter: Payer: Medicare PPO | Attending: Physical Medicine & Rehabilitation | Admitting: Registered Nurse

## 2016-04-17 VITALS — BP 128/77 | HR 63

## 2016-04-17 DIAGNOSIS — G894 Chronic pain syndrome: Secondary | ICD-10-CM

## 2016-04-17 DIAGNOSIS — G510 Bell's palsy: Secondary | ICD-10-CM

## 2016-04-17 DIAGNOSIS — Z9889 Other specified postprocedural states: Secondary | ICD-10-CM | POA: Diagnosis not present

## 2016-04-17 DIAGNOSIS — Z5181 Encounter for therapeutic drug level monitoring: Secondary | ICD-10-CM | POA: Diagnosis present

## 2016-04-17 DIAGNOSIS — R51 Headache: Secondary | ICD-10-CM

## 2016-04-17 DIAGNOSIS — G939 Disorder of brain, unspecified: Secondary | ICD-10-CM

## 2016-04-17 DIAGNOSIS — Z79899 Other long term (current) drug therapy: Secondary | ICD-10-CM

## 2016-04-17 DIAGNOSIS — D333 Benign neoplasm of cranial nerves: Secondary | ICD-10-CM

## 2016-04-17 DIAGNOSIS — S0451XS Injury of facial nerve, right side, sequela: Secondary | ICD-10-CM

## 2016-04-17 MED ORDER — HYDROCODONE-ACETAMINOPHEN 10-325 MG PO TABS: ORAL_TABLET | ORAL | 0 refills | Status: DC

## 2016-04-17 NOTE — Progress Notes (Signed)
Subjective:    Patient ID: [name], male    DOB: Mar 27, 1961, 55 y.o.   MRN: [id number]  HPI: [name] is a 55 year old male who returns for follow up for chronic pain and medication refill. He states his pain is located in his right eye. He rates his pain 7. His current exercise regime is walking. He's using his cadillac walker.  Wife in room all questions answered.  S/P RIGHT LATERAL RECTUS RESECTION; SUPERIOR RECTUS RESECTION RIGHT EYE; RIGHT SUPERIOR RECTUS RECESSION; LATERAL TARSAL STRIP RIGHT LOWER EYELID with Dr. Frederico Hamman on 02/16/16.  Pain Inventory Average Pain 7 Pain Right Now 7 My pain is burning, stabbing and tingling  In the last 24 hours, has pain interfered with the following? General activity 7 Relation with others 0 Enjoyment of life 3 What TIME of day is your pain at its worst? night Sleep (in general) Fair  Pain is worse with: walking, inactivity and standing Pain improves with: rest and medication Relief from Meds: 9  Mobility walk with assistance use a walker how many minutes can you walk? 5-10 ability to climb steps?  no do you drive?  no  Function not employed: date last employed 2014 I need assistance with the following:  dressing and bathing  Neuro/Psych tremor tingling spasms  Prior Studies .  Physicians involved in your care .   Family History  Problem Relation Age of Onset  . CAD Father   . Hypertension Mother   . Cancer Other     multiple relatives   Social History   Social History  . Marital status: Married    Spouse name: N/A  . Number of children: N/A  . Years of education: N/A   Social History Main Topics  . Smoking status: Light Tobacco Smoker    Packs/day: 0.25    Years: 20.00    Types: Cigarettes  . Smokeless tobacco: Current User     Comment: Rare tobacco.  "[name]".  . Alcohol use No  . Drug use: No  . Sexual activity: Yes   Other Topics Concern  . None   Social History  Narrative  . None   Past Surgical History:  Procedure Laterality Date  . 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 ANAL FISTULECTOMY    . CORONARY ARTERY BYPASS GRAFT     LIMA to LAD 2009  . CRANIOTOMY Right 11/20/2012   Procedure: CRANIOTOMY REPAIR DURAL/CENTRAL SPINAL FLUID LEAK;  Surgeon: [name], MD;  Location: [place] [facility];  Service: Neurosurgery;  Laterality: Right;  . EYE SURGERY     to coorect lid and double vision  . MEDIAN RECTUS REPAIR Right 02/16/2016   Procedure: RIGHT LATERAL RECTUS RESECTION; SUPERIOR RECTUS RESECTION RIGHT EYE; RIGHT SUPERIOR RECTUS RECESSION; LATERAL TARSAL STRIP RIGHT LOWER EYELID;  Surgeon: [name], MD;  Location: [place];  Service: Ophthalmology;  Laterality: Right;  . PLACEMENT OF LUMBAR DRAIN N/A 11/20/2012   Procedure: Attempted PLACEMENT OF LUMBAR DRAIN;  Surgeon: [name], MD;  Location: [place] [facility];  Service: Neurosurgery;  Laterality: N/A;  . RETROSIGMOID CRANIECTOMY FOR TUMOR RESECTION Right 10/11/2012   Procedure: RETROSIGMOID CRANIECTOMY FOR TUMOR RESECTION;  Surgeon: [name], MD;  Location: [place] [facility];  Service: Neurosurgery;  Laterality: Right;  Craniotomy for acoustic neuroma  .  TRACHEOSTOMY TUBE PLACEMENT N/A 10/11/2012   Procedure: TRACHEOSTOMY;  Surgeon: Ascencion Dike, MD;  [facility];  Service: ENT;  Laterality: N/A;  . UMBILICAL HERNIA REPAIR     Past Medical History:  Diagnosis Date  . Brain tumor ([place])    Takes Topamax so patient will not have headaches  . Bruises easily   . CAD (coronary artery disease)    Cath 09, 99% LAD  . Cardiomyopathy, ischemic    Ischemic  . CHF (congestive heart failure) ([place])   . Cyst near coccyx   . Cyst near tailbone   . Diabetes mellitus without complication (HCC)    borderline  . Edema of both legs    Takes Lasix  . Fibromyalgia   .  Gout   . Headache(784.0)    with lights  . Hypertension    dr Percival Spanish  . Kidney stones   . Myocardial infarction ([place])   . Obesity   . Pneumonia    hx of  . Shortness of breath   . Stroke [facility])    Affected vision, walking, and facial drooping on right face   BP 125/75   Pulse 60   SpO2 (!) 89%   Opioid Risk Score:   Fall Risk Score:  `1  Depression screen PHQ 2/9  Depression screen PHQ 2/9 02/15/2015  Decreased Interest 0  Down, Depressed, Hopeless 0  PHQ - 2 Score 0  Altered sleeping 0  Tired, decreased energy 0  Change in appetite 0  Feeling bad or failure about yourself  0  Trouble concentrating 0  Moving slowly or fidgety/restless 0  Suicidal thoughts 0  PHQ-9 Score 0    Review of Systems  Constitutional: Negative.   HENT: Negative.   Eyes: Positive for visual disturbance.  Respiratory: Negative.   Cardiovascular: Negative.   Gastrointestinal: Positive for constipation.  Endocrine: Negative.   Genitourinary: Negative.   Musculoskeletal: Negative.   Skin: Negative.   Allergic/Immunologic: Negative.   Neurological: Positive for tremors.  Hematological: Negative.   Psychiatric/Behavioral: Negative.        Objective:   Physical Exam  Constitutional: He is oriented to person, place, and time. He appears well-developed and well-nourished.  HENT:  Head: Normocephalic and atraumatic.  Neck: Normal range of motion. Neck supple.  Cardiovascular: Normal rate and regular rhythm.   Pulmonary/Chest: Effort normal and breath sounds normal.  Musculoskeletal:  Normal Muscle Bulk and Muscle Testing Reveals: Upper Extremities: Full ROM and Muscle Strength 5/5 Lower Extremities: Full ROM and Muscle Strength 5/5 Arises from table slowly using walker for support Narrow Based gait  Neurological: He is alert and oriented to person, place, and time.  Skin: Skin is warm and dry.  Psychiatric: He has a normal mood and affect.  Nursing note and vitals  reviewed.         Assessment & Plan:  1. 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 S/P RIGHT LATERAL RECTUS RESECTION; SUPERIOR RECTUS RESECTION RIGHT EYE; RIGHT SUPERIOR RECTUS RECESSION; LATERAL TARSAL STRIP RIGHT LOWER EYELID with Dr. Frederico Hamman on 02/16/16. Refilled: Hydrocodone 10/325mg  one tablet every 6 hours as needed #120. Second script given for the following month. Continue eye gtts, ointment and patch.  2. Headaches right temporal area related to tumor/surgery. No complaints voiced today. Continue Topamax  3. Lower extremity edema, morbid obesity. Continue to Monitor. PCP Following.  4. Muscle Spasm: Continue Tizanidine. 5. Peripheral Neuropathy: Continue to Monitor  20  minutes of face to face patient care time was spent during this visit. All questions were encouraged and answered.   F/U in 2 months

## 2016-06-12 ENCOUNTER — Encounter: Payer: Self-pay | Admitting: Registered Nurse

## 2016-06-12 ENCOUNTER — Encounter: Payer: Medicare PPO | Attending: Physical Medicine & Rehabilitation | Admitting: Registered Nurse

## 2016-06-12 VITALS — BP 132/79 | HR 63 | Resp 14

## 2016-06-12 DIAGNOSIS — G510 Bell's palsy: Secondary | ICD-10-CM

## 2016-06-12 DIAGNOSIS — G894 Chronic pain syndrome: Secondary | ICD-10-CM | POA: Diagnosis present

## 2016-06-12 DIAGNOSIS — Z5181 Encounter for therapeutic drug level monitoring: Secondary | ICD-10-CM | POA: Insufficient documentation

## 2016-06-12 DIAGNOSIS — Z9889 Other specified postprocedural states: Secondary | ICD-10-CM

## 2016-06-12 DIAGNOSIS — Z79899 Other long term (current) drug therapy: Secondary | ICD-10-CM | POA: Insufficient documentation

## 2016-06-12 DIAGNOSIS — D333 Benign neoplasm of cranial nerves: Secondary | ICD-10-CM | POA: Diagnosis not present

## 2016-06-12 MED ORDER — HYDROCODONE-ACETAMINOPHEN 10-325 MG PO TABS: ORAL_TABLET | ORAL | 0 refills | Status: DC

## 2016-06-12 NOTE — Progress Notes (Signed)
Subjective:    Patient ID: [name], male    DOB: 1961/01/15, 55 y.o.   MRN: [id number]  HPI:  [name] is a 55 year old male who returns for follow up for chronic pain and medication refill. He states his pain is located in his right eye.He rates his pain 7. His current exercise regime is walking. He's using his cadillac walker.  Wife in room all questions answered.   Pain Inventory Average Pain 6 Pain Right Now 7 My pain is constant and burning  In the last 24 hours, has pain interfered with the following? General activity 7 Relation with others 0 Enjoyment of life 6 What TIME of day is your pain at its worst? daytime, night Sleep (in general) Fair  Pain is worse with: walking Pain improves with: medication Relief from Meds: 10  Mobility walk without assistance use a walker how many minutes can you walk? 5 ability to climb steps?  no do you drive?  no  Function not employed: date last employed 2013 I need assistance with the following:  dressing and bathing  Neuro/Psych numbness tremor trouble walking  Prior Studies Any changes since last visit?  no  Physicians involved in your care Any changes since last visit?  no   Family History  Problem Relation Age of Onset  . CAD Father   . Hypertension Mother   . Cancer Other     multiple relatives   Social History   Social History  . Marital status: Married    Spouse name: N/A  . Number of children: N/A  . Years of education: N/A   Social History Main Topics  . Smoking status: Light Tobacco Smoker    Packs/day: 0.25    Years: 20.00    Types: Cigarettes  . Smokeless tobacco: Current User     Comment: Rare tobacco.  "[name]".  . Alcohol use No  . Drug use: No  . Sexual activity: Yes   Other Topics Concern  . None   Social History Narrative  . None   Past Surgical History:  Procedure Laterality Date  . ACOUSTIC NEUROMA RESECTION N/A 10/11/2012   Procedure:  ACOUSTIC NEUROMA RESECTION;  Surgeon: Ascencion Dike, MD;  [facility]: [facility];  Service: ENT;  Laterality: N/A;  . ANAL FISTULECTOMY    . CORONARY ARTERY BYPASS GRAFT     LIMA to LAD 2009  . CRANIOTOMY Right 11/20/2012   Procedure: CRANIOTOMY REPAIR DURAL/CENTRAL SPINAL FLUID LEAK;  Surgeon: [name], MD;  Location: [place] [facility];  Service: Neurosurgery;  Laterality: Right;  . EYE SURGERY     to coorect lid and double vision  . MEDIAN RECTUS REPAIR Right 02/16/2016   Procedure: RIGHT LATERAL RECTUS RESECTION; SUPERIOR RECTUS RESECTION RIGHT EYE; RIGHT SUPERIOR RECTUS RECESSION; LATERAL TARSAL STRIP RIGHT LOWER EYELID;  Surgeon: [name], MD;  Location: [place];  Service: Ophthalmology;  Laterality: Right;  . PLACEMENT OF LUMBAR DRAIN N/A 11/20/2012   Procedure: Attempted PLACEMENT OF LUMBAR DRAIN;  Surgeon: [name], MD;  Location: [place] [facility];  Service: Neurosurgery;  Laterality: N/A;  . RETROSIGMOID CRANIECTOMY FOR TUMOR RESECTION Right 10/11/2012   Procedure: RETROSIGMOID CRANIECTOMY FOR TUMOR RESECTION;  Surgeon: [name], MD;  Location: [place] [facility]UR[facility];  Service: Neurosurgery;  Laterality: Right;  Craniotomy for acoustic neuroma  . TRACHEOSTOMY TUBE PLACEMENT N/A 10/11/2012   Procedure: TRACHEOSTOMY;  Surgeon: Ascencion Dike, MD;  Location:  [facility];  Service: ENT;  Laterality: N/A;  . UMBILICAL HERNIA REPAIR     Past Medical History:  Diagnosis Date  . Brain tumor ([place])    Takes Topamax so patient will not have headaches  . Bruises easily   . CAD (coronary artery disease)    Cath 09, 99% LAD  . Cardiomyopathy, ischemic    Ischemic  . CHF (congestive heart failure) ([place])   . Cyst near coccyx   . Cyst near tailbone   . Diabetes mellitus without complication (HCC)    borderline  . Edema of both legs    Takes Lasix  . Fibromyalgia   . Gout   . Headache(784.0)    with lights  . Hypertension    dr Percival Spanish  . Kidney stones   . Myocardial infarction   . Obesity     . Pneumonia    hx of  . Shortness of breath   . Stroke [facility])    Affected vision, walking, and facial drooping on right face   BP 132/79   Pulse 63   Resp 14   SpO2 93%   Opioid Risk Score:   Fall Risk Score:  `1  Depression screen PHQ 2/9  Depression screen PHQ 2/9 02/15/2015  Decreased Interest 0  Down, Depressed, Hopeless 0  PHQ - 2 Score 0  Altered sleeping 0  Tired, decreased energy 0  Change in appetite 0  Feeling bad or failure about yourself  0  Trouble concentrating 0  Moving slowly or fidgety/restless 0  Suicidal thoughts 0  PHQ-9 Score 0     Review of Systems  Constitutional: Positive for unexpected weight change.  Endocrine:       High blood sugar   Musculoskeletal:       Limb swelling  Skin: Positive for rash.  All other systems reviewed and are negative.      Objective:   Physical Exam  Constitutional: He is oriented to person, place, and time. He appears well-developed and well-nourished.  HENT:  Head: Normocephalic and atraumatic.  Neck: Normal range of motion. Neck supple.  Cardiovascular: Normal rate and regular rhythm.   Pulmonary/Chest: Effort normal and breath sounds normal.  Musculoskeletal: He exhibits edema.  Normal Muscle Bulk and Muscle Testing Reveals: Upper Extremities: Full ROM and Muscle Strength 5/5 Lower Extremities: Full ROM and Muscle Strength 5/5 Arises from table with ease using walker for support Narrow based Gait  Neurological: He is alert and oriented to person, place, and time.  Skin: Skin is warm and dry.  Psychiatric: He has a normal mood and affect.  Nursing note and vitals reviewed.         Assessment & Plan:  1. 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 S/P RIGHT LATERAL RECTUS RESECTION; SUPERIOR RECTUS RESECTION RIGHT EYE; RIGHT SUPERIOR RECTUS RECESSION; LATERAL TARSAL STRIP RIGHT LOWER EYELID with Dr. Frederico Hamman on  02/16/16. Refilled: Hydrocodone 10/325mg  one tablet every 6 hours as needed #120.  We will continue the opioid monitoring program, this consists of regular clinic visits, examinations, urine drug screen, pill counts as well as use of [place] Controlled Substance Reporting System. Second script given for the following month. Continue eye ointment as ordered. 2. Headaches right temporal area related to tumor/surgery. No complaints voiced today. Continue Topamax  3. Lower extremity edema, morbid obesity. Continue to Monitor. PCP Following.  4. Muscle Spasm: Continue Tizanidine. 5. Peripheral Neuropathy: Continue to Monitor  20 minutes of face to face patient care time was spent during this visit. All questions were encouraged and answered.   F/U in 2 months

## 2016-08-18 ENCOUNTER — Encounter: Payer: Self-pay | Admitting: Registered Nurse

## 2016-08-18 ENCOUNTER — Encounter: Payer: Medicare PPO | Attending: Physical Medicine & Rehabilitation | Admitting: Registered Nurse

## 2016-08-18 VITALS — BP 118/71 | HR 70 | Resp 14

## 2016-08-18 DIAGNOSIS — Z79899 Other long term (current) drug therapy: Secondary | ICD-10-CM | POA: Insufficient documentation

## 2016-08-18 DIAGNOSIS — Z5181 Encounter for therapeutic drug level monitoring: Secondary | ICD-10-CM

## 2016-08-18 DIAGNOSIS — G510 Bell's palsy: Secondary | ICD-10-CM

## 2016-08-18 DIAGNOSIS — G894 Chronic pain syndrome: Secondary | ICD-10-CM | POA: Diagnosis not present

## 2016-08-18 DIAGNOSIS — Z9889 Other specified postprocedural states: Secondary | ICD-10-CM | POA: Diagnosis not present

## 2016-08-18 DIAGNOSIS — D333 Benign neoplasm of cranial nerves: Secondary | ICD-10-CM

## 2016-08-18 MED ORDER — HYDROCODONE-ACETAMINOPHEN 10-325 MG PO TABS: ORAL_TABLET | ORAL | 0 refills | Status: DC

## 2016-08-18 NOTE — Progress Notes (Signed)
Subjective:    Patient ID: [name], male    DOB: 04/07/1961, 55 y.o.   MRN: [id number]  HPI: [name] is a 55 year old male who returns for follow up appointment for chronic pain and medication refill. He states his pain is located in his right eye.He rates his pain 7. His current exercise regime is walking.He's using his cadillac walker.   Pain Inventory Average Pain 7 Pain Right Now 7 My pain is stabbing, tingling and aching  In the last 24 hours, has pain interfered with the following? General activity 6 Relation with others 2 Enjoyment of life 8 What TIME of day is your pain at its worst? daytime Sleep (in general) Fair  Pain is worse with: walking and standing Pain improves with: rest and medication Relief from Meds: 8  Mobility walk with assistance use a walker how many minutes can you walk? 10 ability to climb steps?  no do you drive?  no needs help with transfers  Function disabled: date disabled . retired I need assistance with the following:  dressing, bathing, meal prep and shopping  Neuro/Psych bladder control problems numbness tremor tingling trouble walking spasms  Prior Studies Any changes since last visit?  no  Physicians involved in your care Any changes since last visit?  no   Family History  Problem Relation Age of Onset  . CAD Father   . Hypertension Mother   . Cancer Other     multiple relatives   Social History   Social History  . Marital status: Married    Spouse name: N/A  . Number of children: N/A  . Years of education: N/A   Social History Main Topics  . Smoking status: Light Tobacco Smoker    Packs/day: 0.25    Years: 20.00    Types: Cigarettes  . Smokeless tobacco: Current User     Comment: Rare tobacco.  "[name]".  . Alcohol use No  . Drug use: No  . Sexual activity: Yes   Other Topics Concern  . None   Social History Narrative  . None   Past Surgical History:    Procedure Laterality Date  . 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 ANAL FISTULECTOMY    . CORONARY ARTERY BYPASS GRAFT     LIMA to LAD 2009  . CRANIOTOMY Right 11/20/2012   Procedure: CRANIOTOMY REPAIR DURAL/CENTRAL SPINAL FLUID LEAK;  Surgeon: [name], MD;  Location: [place] [facility];  Service: Neurosurgery;  Laterality: Right;  . EYE SURGERY     to coorect lid and double vision  . MEDIAN RECTUS REPAIR Right 02/16/2016   Procedure: RIGHT LATERAL RECTUS RESECTION; SUPERIOR RECTUS RESECTION RIGHT EYE; RIGHT SUPERIOR RECTUS RECESSION; LATERAL TARSAL STRIP RIGHT LOWER EYELID;  Surgeon: [name], MD;  Location: [place];  Service: Ophthalmology;  Laterality: Right;  . PLACEMENT OF LUMBAR DRAIN N/A 11/20/2012   Procedure: Attempted PLACEMENT OF LUMBAR DRAIN;  Surgeon: [name], MD;  Location: [place] [facility];  Service: Neurosurgery;  Laterality: N/A;  . RETROSIGMOID CRANIECTOMY FOR TUMOR RESECTION Right 10/11/2012   Procedure: RETROSIGMOID CRANIECTOMY FOR TUMOR RESECTION;  Surgeon: [name], MD;  Location: [place] [facility];  Service: Neurosurgery;  Laterality: Right;  Craniotomy for acoustic neuroma  . TRACHEOSTOMY TUBE PLACEMENT N/A 10/11/2012   Procedure: TRACHEOSTOMY;  Surgeon: Ascencion Dike, MD;  [facility];  Service: ENT;  Laterality: N/A;  . UMBILICAL HERNIA REPAIR     Past Medical History:  Diagnosis Date  . Brain tumor ([place])    Takes Topamax so patient will not have headaches  . Bruises easily   . CAD (coronary artery disease)    Cath 09, 99% LAD  . Cardiomyopathy, ischemic    Ischemic  . CHF (congestive heart failure) ([place])   . Cyst near coccyx   . Cyst near tailbone   . Diabetes mellitus without complication (HCC)    borderline  . Edema of both legs    Takes Lasix  . Fibromyalgia   . Gout   . Headache(784.0)    with lights  .  Hypertension    dr Percival Spanish  . Kidney stones   . Myocardial infarction   . Obesity   . Pneumonia    hx of  . Shortness of breath   . Stroke [facility])    Affected vision, walking, and facial drooping on right face   BP (!) 145/88   Pulse 73   Resp 14   SpO2 (!) 86%   Opioid Risk Score:   Fall Risk Score:  `1  Depression screen PHQ 2/9  Depression screen PHQ 2/9 02/15/2015  Decreased Interest 0  Down, Depressed, Hopeless 0  PHQ - 2 Score 0  Altered sleeping 0  Tired, decreased energy 0  Change in appetite 0  Feeling bad or failure about yourself  0  Trouble concentrating 0  Moving slowly or fidgety/restless 0  Suicidal thoughts 0  PHQ-9 Score 0    Review of Systems  Constitutional: Positive for unexpected weight change.  Eyes: Positive for pain.  Respiratory: Positive for shortness of breath.   Cardiovascular: Positive for leg swelling.  Gastrointestinal: Positive for constipation.  Endocrine:       Low blood sugar  Genitourinary: Positive for frequency.  Musculoskeletal: Positive for back pain and gait problem.       Spasms  Allergic/Immunologic: Negative.   Neurological: Positive for tremors and numbness.       Tingling  Hematological: Negative.   Psychiatric/Behavioral: Negative.   All other systems reviewed and are negative.      Objective:   Physical Exam  Constitutional: He is oriented to person, place, and time. He appears well-developed and well-nourished.  HENT:  Head: Normocephalic and atraumatic.  Eyes:  Right Temporal Tenderness  Neck: Normal range of motion. Neck supple.  Cardiovascular: Normal rate and regular rhythm.   Pulmonary/Chest: Effort normal and breath sounds normal.  Musculoskeletal:  Normal Muscle Bulk and Muscle Testing Reveals: Upper Extremities: Full ROM and Muscle Strength 5/5 Lower Extremities: Full ROM and Muscle Strength 5/5 Arises from Table slowly using walker for support Narrow Based Gait   Neurological: He is alert  and oriented to person, place, and time. He has normal reflexes.  Skin: Skin is warm and dry.  Psychiatric: He has a normal mood and affect.  Nursing note and vitals reviewed.         Assessment & Plan:  1. 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 S/P RIGHT LATERAL RECTUS RESECTION; SUPERIOR RECTUS RESECTION RIGHT EYE; RIGHT SUPERIOR RECTUS RECESSION; LATERAL TARSAL STRIP RIGHT LOWER EYELID with Dr. Frederico Hamman on 02/16/16. Refilled: Hydrocodone 10/325mg  one tablet every 6 hours as needed #120.  We will continue the opioid monitoring program, this consists of regular clinic  visits, examinations, urine drug screen, pill counts as well as use of [place] Controlled Substance Reporting System. Continue eye ointment as ordered. 2. Headaches right temporal area related to tumor/surgery. No complaints voiced today. Continue Topamax  3. Lower extremity edema, morbid obesity. Continue to Monitor. PCP Following.  4. Muscle Spasm: Continue Tizanidine. 5. Peripheral Neuropathy: Continue to Monitor  20 minutes of face to face patient care time was spent during this visit. All questions were encouraged and answered.   F/U in 2 months

## 2016-08-18 NOTE — Addendum Note (Signed)
Addended by: Marland Mcalpine B on: 08/18/2016 01:59 PM   Modules accepted: Orders

## 2016-08-26 LAB — TOXASSURE SELECT,+ANTIDEPR,UR

## 2016-10-02 ENCOUNTER — Other Ambulatory Visit: Payer: Self-pay | Admitting: Physical Medicine & Rehabilitation

## 2016-10-16 ENCOUNTER — Ambulatory Visit: Payer: Medicare PPO | Admitting: Physical Medicine & Rehabilitation

## 2016-11-16 ENCOUNTER — Encounter: Payer: Medicare PPO | Attending: Physical Medicine & Rehabilitation | Admitting: Registered Nurse

## 2016-11-16 ENCOUNTER — Encounter: Payer: Self-pay | Admitting: Registered Nurse

## 2016-11-16 VITALS — BP 141/77 | HR 72 | Resp 14

## 2016-11-16 DIAGNOSIS — I252 Old myocardial infarction: Secondary | ICD-10-CM | POA: Diagnosis not present

## 2016-11-16 DIAGNOSIS — G629 Polyneuropathy, unspecified: Secondary | ICD-10-CM | POA: Insufficient documentation

## 2016-11-16 DIAGNOSIS — R600 Localized edema: Secondary | ICD-10-CM | POA: Insufficient documentation

## 2016-11-16 DIAGNOSIS — I110 Hypertensive heart disease with heart failure: Secondary | ICD-10-CM | POA: Diagnosis not present

## 2016-11-16 DIAGNOSIS — G510 Bell's palsy: Secondary | ICD-10-CM | POA: Insufficient documentation

## 2016-11-16 DIAGNOSIS — G894 Chronic pain syndrome: Secondary | ICD-10-CM | POA: Diagnosis not present

## 2016-11-16 DIAGNOSIS — Z9889 Other specified postprocedural states: Secondary | ICD-10-CM | POA: Diagnosis not present

## 2016-11-16 DIAGNOSIS — Z79899 Other long term (current) drug therapy: Secondary | ICD-10-CM | POA: Diagnosis not present

## 2016-11-16 DIAGNOSIS — Z760 Encounter for issue of repeat prescription: Secondary | ICD-10-CM | POA: Insufficient documentation

## 2016-11-16 DIAGNOSIS — I509 Heart failure, unspecified: Secondary | ICD-10-CM | POA: Diagnosis not present

## 2016-11-16 DIAGNOSIS — Z930 Tracheostomy status: Secondary | ICD-10-CM | POA: Diagnosis not present

## 2016-11-16 DIAGNOSIS — R51 Headache: Secondary | ICD-10-CM | POA: Insufficient documentation

## 2016-11-16 DIAGNOSIS — M109 Gout, unspecified: Secondary | ICD-10-CM | POA: Insufficient documentation

## 2016-11-16 DIAGNOSIS — M797 Fibromyalgia: Secondary | ICD-10-CM | POA: Insufficient documentation

## 2016-11-16 DIAGNOSIS — Z951 Presence of aortocoronary bypass graft: Secondary | ICD-10-CM | POA: Diagnosis not present

## 2016-11-16 DIAGNOSIS — Z87442 Personal history of urinary calculi: Secondary | ICD-10-CM | POA: Insufficient documentation

## 2016-11-16 DIAGNOSIS — M62838 Other muscle spasm: Secondary | ICD-10-CM | POA: Insufficient documentation

## 2016-11-16 DIAGNOSIS — I255 Ischemic cardiomyopathy: Secondary | ICD-10-CM | POA: Diagnosis not present

## 2016-11-16 DIAGNOSIS — Z5181 Encounter for therapeutic drug level monitoring: Secondary | ICD-10-CM

## 2016-11-16 DIAGNOSIS — E119 Type 2 diabetes mellitus without complications: Secondary | ICD-10-CM | POA: Insufficient documentation

## 2016-11-16 DIAGNOSIS — F17210 Nicotine dependence, cigarettes, uncomplicated: Secondary | ICD-10-CM | POA: Insufficient documentation

## 2016-11-16 DIAGNOSIS — I2510 Atherosclerotic heart disease of native coronary artery without angina pectoris: Secondary | ICD-10-CM | POA: Diagnosis not present

## 2016-11-16 DIAGNOSIS — G8929 Other chronic pain: Secondary | ICD-10-CM | POA: Insufficient documentation

## 2016-11-16 DIAGNOSIS — D333 Benign neoplasm of cranial nerves: Secondary | ICD-10-CM

## 2016-11-16 DIAGNOSIS — Z8673 Personal history of transient ischemic attack (TIA), and cerebral infarction without residual deficits: Secondary | ICD-10-CM | POA: Diagnosis not present

## 2016-11-16 DIAGNOSIS — Z8249 Family history of ischemic heart disease and other diseases of the circulatory system: Secondary | ICD-10-CM | POA: Diagnosis not present

## 2016-11-16 DIAGNOSIS — Z809 Family history of malignant neoplasm, unspecified: Secondary | ICD-10-CM | POA: Insufficient documentation

## 2016-11-16 MED ORDER — HYDROCODONE-ACETAMINOPHEN 10-325 MG PO TABS: ORAL_TABLET | ORAL | 0 refills | Status: DC

## 2016-11-16 NOTE — Progress Notes (Signed)
Subjective:    Patient ID: [name], male    DOB: [birth year], 56 y.o.   MRN: [id number]  HPI: [name] is a 56 year old male who returns for follow up appointment for chronic pain and medication refill. He states his pain is located in his bilateral feet.He rates his pain 7. His current exercise regime is walking.He's using his cadillac walker.  Wife in room all questions answered.   Pain Inventory Average Pain 7 Pain Right Now 7 My pain is burning and tingling  In the last 24 hours, has pain interfered with the following? General activity 7 Relation with others 4 Enjoyment of life 7 What TIME of day is your pain at its worst? evening Sleep (in general) Fair  Pain is worse with: walking, standing and some activites Pain improves with: rest and medication Relief from Meds: 9  Mobility walk without assistance walk with assistance use a walker how many minutes can you walk? 10 ability to climb steps?  no do you drive?  no needs help with transfers  Function disabled: date disabled . retired I need assistance with the following:  dressing, bathing, meal prep and shopping  Neuro/Psych trouble walking spasms  Prior Studies Any changes since last visit?  no  Physicians involved in your care Any changes since last visit?  no   Family History  Problem Relation Age of Onset  . CAD Father   . Hypertension Mother   . Cancer Other     multiple relatives   Social History   Social History  . Marital status: Married    Spouse name: N/A  . Number of children: N/A  . Years of education: N/A   Social History Main Topics  . Smoking status: Light Tobacco Smoker    Packs/day: 0.25    Years: 20.00    Types: Cigarettes  . Smokeless tobacco: Current User     Comment: Rare tobacco.  "[name]".  . Alcohol use No  . Drug use: No  . Sexual activity: Yes   Other Topics Concern  . None   Social History Narrative  . None   Past  Surgical History:  Procedure Laterality Date  . 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 ANAL FISTULECTOMY    . CORONARY ARTERY BYPASS GRAFT     LIMA to LAD 2009  . CRANIOTOMY Right 11/20/2012   Procedure: CRANIOTOMY REPAIR DURAL/CENTRAL SPINAL FLUID LEAK;  Surgeon: [name], MD;  Location: [facility];  Service: Neurosurgery;  Laterality: Right;  . EYE SURGERY     to coorect lid and double vision  . MEDIAN RECTUS REPAIR Right 02/16/2016   Procedure: RIGHT LATERAL RECTUS RESECTION; SUPERIOR RECTUS RESECTION RIGHT EYE; RIGHT SUPERIOR RECTUS RECESSION; LATERAL TARSAL STRIP RIGHT LOWER EYELID;  Surgeon: [name], MD;  Location: [facility];  Service: Ophthalmology;  Laterality: Right;  . PLACEMENT OF LUMBAR DRAIN N/A 11/20/2012   Procedure: Attempted PLACEMENT OF LUMBAR DRAIN;  Surgeon: [name], MD;  Location: [facility];  Service: Neurosurgery;  Laterality: N/A;  . RETROSIGMOID CRANIECTOMY FOR TUMOR RESECTION Right 10/11/2012   Procedure: RETROSIGMOID CRANIECTOMY FOR TUMOR RESECTION;  Surgeon: [name], MD;  Location: [facility];  Service: Neurosurgery;  Laterality: Right;  Craniotomy for acoustic neuroma  . TRACHEOSTOMY TUBE PLACEMENT N/A 10/11/2012  Procedure: TRACHEOSTOMY;  Surgeon: Ascencion Dike, MD;  [facility];  Service: ENT;  Laterality: N/A;  . UMBILICAL HERNIA REPAIR     Past Medical History:  Diagnosis Date  . Brain tumor ([place])    Takes Topamax so patient will not have headaches  . Bruises easily   . CAD (coronary artery disease)    Cath 09, 99% LAD  . Cardiomyopathy, ischemic    Ischemic  . CHF (congestive heart failure) ([place])   . Cyst near coccyx   . Cyst near tailbone   . Diabetes mellitus without complication (HCC)    borderline  . Edema of both legs    Takes Lasix  . Fibromyalgia   . Gout   . Headache(784.0)     with lights  . Hypertension    dr Percival Spanish  . Kidney stones   . Myocardial infarction   . Obesity   . Pneumonia    hx of  . Shortness of breath   . Stroke ([place])    Affected vision, walking, and facial drooping on right face   BP (!) 171/91   Pulse 75   Resp 14   SpO2 (!) 84%   Opioid Risk Score:   Fall Risk Score:  `1  Depression screen PHQ 2/9  Depression screen PHQ 2/9 02/15/2015  Decreased Interest 0  Down, Depressed, Hopeless 0  PHQ - 2 Score 0  Altered sleeping 0  Tired, decreased energy 0  Change in appetite 0  Feeling bad or failure about yourself  0  Trouble concentrating 0  Moving slowly or fidgety/restless 0  Suicidal thoughts 0  PHQ-9 Score 0    Review of Systems  Constitutional: Positive for unexpected weight change.  HENT: Negative.   Eyes: Negative.   Respiratory: Negative.   Cardiovascular: Positive for leg swelling.  Gastrointestinal: Positive for constipation.  Endocrine: Negative.        Low blood sugar  Genitourinary: Negative.   Musculoskeletal: Positive for back pain.  Skin: Positive for rash.  Allergic/Immunologic: Negative.   Neurological: Negative.   Hematological: Negative.   Psychiatric/Behavioral: Negative.   All other systems reviewed and are negative.      Objective:   Physical Exam  Constitutional: He is oriented to person, place, and time. He appears well-developed and well-nourished.  HENT:  Head: Normocephalic and atraumatic.  Neck: Normal range of motion. Neck supple.  Cardiovascular: Normal rate and regular rhythm.   Pulmonary/Chest: Effort normal and breath sounds normal.  Continuous Oxygen at 2 liters nasal cannula  Musculoskeletal: He exhibits edema.  Normal Muscle Bulk and Muscle Testing Reveals: Upper Extremities: Full ROM and Muscle Strength 5/5 Lower Extremities: Full ROM and Muscle Strength 5/5 Arises from Table slowly using walker for support Narrow Based Gait  Neurological: He is alert and oriented to  person, place, and time.  Skin: Skin is warm and dry.  Psychiatric: He has a normal mood and affect.  Nursing note and vitals reviewed.         Assessment & Plan:  1. 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 S/P RIGHT LATERAL RECTUS RESECTION; SUPERIOR RECTUS RESECTION RIGHT EYE; RIGHT SUPERIOR RECTUS RECESSION; LATERAL TARSAL STRIP RIGHT LOWER EYELID with Dr. Frederico Hamman on 02/16/16. Refilled: Hydrocodone 10/325mg  one tablet every 6 hours as needed #120. Second script for the following month. We will continue the opioid monitoring program, this consists of regular clinic visits, examinations, urine drug screen, pill counts  as well as use of [place] Controlled Substance Reporting System. Continue eye ointment as ordered. 2. Headaches right temporal area related to tumor/surgery. No complaints voiced today. Continue Topamax  3. Lower extremity edema, morbid obesity. Continue to Monitor. PCP Following.  4. Muscle Spasm: Continue Tizanidine. 5. Peripheral Neuropathy: Continue to Monitor  20 minutes of face to face patient care time was spent during this visit. All questions were encouraged and answered.    F/U in 2 months

## 2016-11-19 ENCOUNTER — Telehealth: Payer: Self-pay | Admitting: Registered Nurse

## 2016-11-19 NOTE — Telephone Encounter (Signed)
On 11/16/2016 the [place] was reviewed no conflict was seen on the [place] with multiple prescribers. [name] has a signed narcotic contract with our office. If there were any discrepancies this would have been reported to his physician.

## 2017-01-08 ENCOUNTER — Encounter: Payer: Self-pay | Admitting: Physical Medicine & Rehabilitation

## 2017-01-08 ENCOUNTER — Encounter: Payer: Medicare PPO | Attending: Physical Medicine & Rehabilitation | Admitting: Physical Medicine & Rehabilitation

## 2017-01-08 VITALS — BP 147/71 | HR 83 | Resp 14

## 2017-01-08 DIAGNOSIS — Z87442 Personal history of urinary calculi: Secondary | ICD-10-CM | POA: Insufficient documentation

## 2017-01-08 DIAGNOSIS — G939 Disorder of brain, unspecified: Secondary | ICD-10-CM | POA: Diagnosis not present

## 2017-01-08 DIAGNOSIS — Z79899 Other long term (current) drug therapy: Secondary | ICD-10-CM

## 2017-01-08 DIAGNOSIS — I252 Old myocardial infarction: Secondary | ICD-10-CM | POA: Diagnosis not present

## 2017-01-08 DIAGNOSIS — G894 Chronic pain syndrome: Secondary | ICD-10-CM | POA: Diagnosis not present

## 2017-01-08 DIAGNOSIS — Z760 Encounter for issue of repeat prescription: Secondary | ICD-10-CM | POA: Insufficient documentation

## 2017-01-08 DIAGNOSIS — D333 Benign neoplasm of cranial nerves: Secondary | ICD-10-CM

## 2017-01-08 DIAGNOSIS — E119 Type 2 diabetes mellitus without complications: Secondary | ICD-10-CM | POA: Insufficient documentation

## 2017-01-08 DIAGNOSIS — I110 Hypertensive heart disease with heart failure: Secondary | ICD-10-CM | POA: Diagnosis not present

## 2017-01-08 DIAGNOSIS — R51 Headache: Secondary | ICD-10-CM | POA: Diagnosis not present

## 2017-01-08 DIAGNOSIS — G609 Hereditary and idiopathic neuropathy, unspecified: Secondary | ICD-10-CM

## 2017-01-08 DIAGNOSIS — Z930 Tracheostomy status: Secondary | ICD-10-CM | POA: Diagnosis not present

## 2017-01-08 DIAGNOSIS — Z5181 Encounter for therapeutic drug level monitoring: Secondary | ICD-10-CM

## 2017-01-08 DIAGNOSIS — I255 Ischemic cardiomyopathy: Secondary | ICD-10-CM | POA: Diagnosis not present

## 2017-01-08 DIAGNOSIS — M797 Fibromyalgia: Secondary | ICD-10-CM | POA: Insufficient documentation

## 2017-01-08 DIAGNOSIS — I509 Heart failure, unspecified: Secondary | ICD-10-CM | POA: Insufficient documentation

## 2017-01-08 DIAGNOSIS — M109 Gout, unspecified: Secondary | ICD-10-CM | POA: Insufficient documentation

## 2017-01-08 DIAGNOSIS — R600 Localized edema: Secondary | ICD-10-CM | POA: Insufficient documentation

## 2017-01-08 DIAGNOSIS — M62838 Other muscle spasm: Secondary | ICD-10-CM | POA: Insufficient documentation

## 2017-01-08 DIAGNOSIS — G8929 Other chronic pain: Secondary | ICD-10-CM | POA: Insufficient documentation

## 2017-01-08 DIAGNOSIS — R519 Headache, unspecified: Secondary | ICD-10-CM

## 2017-01-08 DIAGNOSIS — I2510 Atherosclerotic heart disease of native coronary artery without angina pectoris: Secondary | ICD-10-CM | POA: Diagnosis not present

## 2017-01-08 DIAGNOSIS — Z951 Presence of aortocoronary bypass graft: Secondary | ICD-10-CM | POA: Diagnosis not present

## 2017-01-08 DIAGNOSIS — G510 Bell's palsy: Secondary | ICD-10-CM

## 2017-01-08 DIAGNOSIS — Z8673 Personal history of transient ischemic attack (TIA), and cerebral infarction without residual deficits: Secondary | ICD-10-CM | POA: Insufficient documentation

## 2017-01-08 DIAGNOSIS — Z809 Family history of malignant neoplasm, unspecified: Secondary | ICD-10-CM | POA: Insufficient documentation

## 2017-01-08 DIAGNOSIS — Z9889 Other specified postprocedural states: Secondary | ICD-10-CM

## 2017-01-08 DIAGNOSIS — F17210 Nicotine dependence, cigarettes, uncomplicated: Secondary | ICD-10-CM | POA: Insufficient documentation

## 2017-01-08 DIAGNOSIS — G629 Polyneuropathy, unspecified: Secondary | ICD-10-CM | POA: Diagnosis not present

## 2017-01-08 DIAGNOSIS — Z8249 Family history of ischemic heart disease and other diseases of the circulatory system: Secondary | ICD-10-CM | POA: Insufficient documentation

## 2017-01-08 MED ORDER — CARBAMAZEPINE 200 MG PO TABS: 200.0000 mg | ORAL_TABLET | Freq: Two times a day (BID) | ORAL | 5 refills | Status: DC

## 2017-01-08 MED ORDER — HYDROCODONE-ACETAMINOPHEN 10-325 MG PO TABS: ORAL_TABLET | ORAL | 0 refills | Status: DC

## 2017-01-08 MED ORDER — TIZANIDINE HCL 4 MG PO TABS: ORAL_TABLET | ORAL | 3 refills | Status: DC

## 2017-01-08 MED ORDER — TOPIRAMATE 100 MG PO TABS: 100.0000 mg | ORAL_TABLET | Freq: Every day | ORAL | 5 refills | Status: DC

## 2017-01-08 NOTE — Progress Notes (Signed)
Subjective:    Patient ID: Frank Pat., male    DOB: August 27, 1960, 56 y.o.   MRN: [id number]  HPI  [name] is here in follow up of his acoustic neuroma and associated right CN 7 injury/pain. He had eye surgery last summer which has really helped his right eye closure and associated pain. He is still using drops for the eye. Irritation is down.   He is having a lot of problems with the nerve pain in his feet/legs. He stopped gabapentin last summer. His headaches have improved a good bit and he's reduced topamax to 100mg  qhs. He remains on hydorcodone for his generalized pain.   He denies any new health issues. He gets little exercise. 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 General activity 6 Relation with others 9 Enjoyment of life 8 What TIME of day is your pain at its worst? daytime, night Sleep (in general) Fair  Pain is worse with: walking, bending and standing Pain improves with: rest and medication Relief from Meds: 5  Mobility walk with assistance use a walker how many minutes can you walk? 10 ability to climb steps?  no do you drive?  no needs help with transfers  Function disabled: date disabled . retired I need assistance with the following:  dressing, bathing and meal prep  Neuro/Psych bladder control problems weakness numbness trouble walking  Prior Studies Any changes since last visit?  no  Physicians involved in your care Any changes since last visit?  no   Family History  Problem Relation Age of Onset  . CAD Father   . Hypertension Mother   . Cancer Other        multiple relatives   Social History   Social History  . Marital status: Married    Spouse name: N/A  . Number of children: N/A  . Years of education: N/A   Social History Main Topics  . Smoking status: Light Tobacco Smoker     Packs/day: 0.25    Years: 20.00    Types: Cigarettes  . Smokeless tobacco: Current User     Comment: Rare tobacco.  "[name]".  . Alcohol use No  . Drug use: No  . Sexual activity: Yes   Other Topics Concern  . None   Social History Narrative  . None   Past Surgical History:  Procedure Laterality Date  . 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 ANAL FISTULECTOMY    . CORONARY ARTERY BYPASS GRAFT     LIMA to LAD 2009  . CRANIOTOMY Right 11/20/2012   Procedure: CRANIOTOMY REPAIR DURAL/CENTRAL SPINAL FLUID LEAK;  Surgeon: [name], MD;  Location: [facility];  Service: Neurosurgery;  Laterality: Right;  . EYE SURGERY     to coorect lid and double vision  . MEDIAN RECTUS REPAIR Right 02/16/2016   Procedure: RIGHT LATERAL RECTUS RESECTION; SUPERIOR RECTUS RESECTION RIGHT EYE; RIGHT SUPERIOR RECTUS RECESSION; LATERAL TARSAL STRIP RIGHT LOWER EYELID;  Surgeon: [name], MD;  Location: [facility];  Service: Ophthalmology;  Laterality: Right;  . PLACEMENT OF LUMBAR DRAIN N/A 11/20/2012   Procedure: Attempted PLACEMENT OF LUMBAR DRAIN;  Surgeon: [name],  MD;  Location: [facility];  Service: Neurosurgery;  Laterality: N/A;  . RETROSIGMOID CRANIECTOMY FOR TUMOR RESECTION Right 10/11/2012   Procedure: RETROSIGMOID CRANIECTOMY FOR TUMOR RESECTION;  Surgeon: [name], MD;  Location: [facility];  Service: Neurosurgery;  Laterality: Right;  Craniotomy for acoustic neuroma  . TRACHEOSTOMY TUBE PLACEMENT N/A 10/11/2012   Procedure: TRACHEOSTOMY;  Surgeon: Ascencion Dike, MD;  [facility];  Service: ENT;  Laterality: N/A;  . UMBILICAL HERNIA REPAIR     Past Medical History:  Diagnosis Date  . Brain tumor ([place])    Takes Topamax so patient will not have headaches  . Bruises easily   . CAD (coronary artery disease)    Cath 09, 99%  LAD  . Cardiomyopathy, ischemic    Ischemic  . CHF (congestive heart failure) ([place])   . Cyst near coccyx   . Cyst near tailbone   . Diabetes mellitus without complication (HCC)    borderline  . Edema of both legs    Takes Lasix  . Fibromyalgia   . Gout   . Headache(784.0)    with lights  . Hypertension    dr Percival Spanish  . Kidney stones   . Myocardial infarction ([place])   . Obesity   . Pneumonia    hx of  . Shortness of breath   . Stroke [facility])    Affected vision, walking, and facial drooping on right face   BP (!) 147/71 (BP Location: Left Wrist, Patient Position: Sitting, Cuff Size: Large)   Pulse 83   Resp 14   SpO2 94%   Opioid Risk Score:   Fall Risk Score:  `1  Depression screen PHQ 2/9  Depression screen PHQ 2/9 02/15/2015  Decreased Interest 0  Down, Depressed, Hopeless 0  PHQ - 2 Score 0  Altered sleeping 0  Tired, decreased energy 0  Change in appetite 0  Feeling bad or failure about yourself  0  Trouble concentrating 0  Moving slowly or fidgety/restless 0  Suicidal thoughts 0  PHQ-9 Score 0    Review of Systems  Constitutional: Positive for unexpected weight change.  Respiratory: Positive for cough, shortness of breath and wheezing.   Cardiovascular: Positive for leg swelling.  Gastrointestinal: Positive for constipation.  Endocrine:       Low blood sugar  Genitourinary: Positive for frequency and urgency.  Musculoskeletal: Positive for gait problem.  Skin: Positive for rash.  Neurological: Positive for weakness and numbness.  Hematological: Negative.   Psychiatric/Behavioral: Negative.        Objective:   Physical Exam  Constitutional:morbidly obese  HENT:  Head: Normocephalic and atraumatic.  Neck: Normal range of motion. Neck supple.  Cardiovascular: RRR.   Pulmonary/Chest: CTA with normal effort  Continuous Oxygen at 2 liters nasal cannula  Musculoskeletal: He exhibits edema.  shufling gait. Leans forward over walker.  Neurological:  strength nearly 4/5 to 5/5. Better able to close right eye lid. Right facial droop still present. Dysarthric. Attention issues.  Skin: Skin is warm and dry.  Psychiatric: He has a normal mood and affect.  N .         Assessment & Plan:  1. 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  He is s/p eye surgery last summer per  Dr. Frederico Hamman on 02/16/16. Refilled: Hydrocodone 10/325mg  one tablet every 6 hours as needed #120. Second script for the following month. We will continue the opioid monitoring program,  this consists of regular clinic visits, examinations, urine drug screen, pill counts as well as use of [place] Controlled Substance Reporting System. [place] today.  UDS collected  2. Headaches right temporal area related to tumor/surgery. Doing alright with reduced topamax=100mg   -can consider weaning further later this year  3. Lower extremity edema, morbid obesity. Per primary.  4. Muscle Spasm: Continue Tizanidine.was refilled today 5. Peripheral Neuropathy: trial of tegretol 200mg  qhs to bid.   20 minutes of face to face patient care time was spent during this visit. All questions were encouraged and answered. Follow up in 2 months.

## 2017-01-08 NOTE — Patient Instructions (Signed)
PLEASE FEEL FREE TO CALL OUR OFFICE WITH ANY PROBLEMS OR QUESTIONS ([phone])      

## 2017-01-11 LAB — TOXASSURE SELECT,+ANTIDEPR,UR

## 2017-01-16 ENCOUNTER — Telehealth: Payer: Self-pay | Admitting: *Deleted

## 2017-01-16 NOTE — Telephone Encounter (Signed)
Urine drug screen for this encounter is consistent for prescribed medication 

## 2017-01-28 ENCOUNTER — Inpatient Hospital Stay (HOSPITAL_COMMUNITY): Payer: Medicare PPO

## 2017-01-28 ENCOUNTER — Encounter (HOSPITAL_COMMUNITY): Payer: Self-pay | Admitting: Emergency Medicine

## 2017-01-28 ENCOUNTER — Inpatient Hospital Stay (HOSPITAL_COMMUNITY)
Admission: EM | Admit: 2017-01-28 | Discharge: 2017-02-03 | DRG: 871 | Disposition: A | Discharge status: Home Health | Payer: Medicare PPO | Attending: Internal Medicine | Admitting: Internal Medicine

## 2017-01-28 ENCOUNTER — Emergency Department (HOSPITAL_COMMUNITY): Payer: Medicare PPO

## 2017-01-28 DIAGNOSIS — H5462 Unqualified visual loss, left eye, normal vision right eye: Secondary | ICD-10-CM | POA: Diagnosis present

## 2017-01-28 DIAGNOSIS — N3000 Acute cystitis without hematuria: Secondary | ICD-10-CM | POA: Diagnosis not present

## 2017-01-28 DIAGNOSIS — I255 Ischemic cardiomyopathy: Secondary | ICD-10-CM | POA: Diagnosis present

## 2017-01-28 DIAGNOSIS — E7220 Disorder of urea cycle metabolism, unspecified: Secondary | ICD-10-CM | POA: Diagnosis present

## 2017-01-28 DIAGNOSIS — I5031 Acute diastolic (congestive) heart failure: Secondary | ICD-10-CM | POA: Diagnosis not present

## 2017-01-28 DIAGNOSIS — I509 Heart failure, unspecified: Secondary | ICD-10-CM

## 2017-01-28 DIAGNOSIS — E662 Morbid (severe) obesity with alveolar hypoventilation: Secondary | ICD-10-CM | POA: Diagnosis present

## 2017-01-28 DIAGNOSIS — I480 Paroxysmal atrial fibrillation: Secondary | ICD-10-CM | POA: Diagnosis present

## 2017-01-28 DIAGNOSIS — E1165 Type 2 diabetes mellitus with hyperglycemia: Secondary | ICD-10-CM | POA: Diagnosis present

## 2017-01-28 DIAGNOSIS — F17210 Nicotine dependence, cigarettes, uncomplicated: Secondary | ICD-10-CM | POA: Diagnosis present

## 2017-01-28 DIAGNOSIS — J811 Chronic pulmonary edema: Secondary | ICD-10-CM

## 2017-01-28 DIAGNOSIS — R0902 Hypoxemia: Secondary | ICD-10-CM | POA: Diagnosis present

## 2017-01-28 DIAGNOSIS — N390 Urinary tract infection, site not specified: Secondary | ICD-10-CM | POA: Diagnosis present

## 2017-01-28 DIAGNOSIS — H04129 Dry eye syndrome of unspecified lacrimal gland: Secondary | ICD-10-CM | POA: Diagnosis present

## 2017-01-28 DIAGNOSIS — M1A9XX0 Chronic gout, unspecified, without tophus (tophi): Secondary | ICD-10-CM | POA: Diagnosis present

## 2017-01-28 DIAGNOSIS — E876 Hypokalemia: Secondary | ICD-10-CM | POA: Diagnosis present

## 2017-01-28 DIAGNOSIS — T502X5A Adverse effect of carbonic-anhydrase inhibitors, benzothiadiazides and other diuretics, initial encounter: Secondary | ICD-10-CM | POA: Diagnosis not present

## 2017-01-28 DIAGNOSIS — Z87442 Personal history of urinary calculi: Secondary | ICD-10-CM

## 2017-01-28 DIAGNOSIS — Z6841 Body Mass Index (BMI) 40.0 and over, adult: Secondary | ICD-10-CM | POA: Diagnosis not present

## 2017-01-28 DIAGNOSIS — J9612 Chronic respiratory failure with hypercapnia: Secondary | ICD-10-CM | POA: Diagnosis present

## 2017-01-28 DIAGNOSIS — I110 Hypertensive heart disease with heart failure: Secondary | ICD-10-CM | POA: Diagnosis present

## 2017-01-28 DIAGNOSIS — G510 Bell's palsy: Secondary | ICD-10-CM | POA: Diagnosis present

## 2017-01-28 DIAGNOSIS — H578 Other specified disorders of eye and adnexa: Secondary | ICD-10-CM | POA: Diagnosis present

## 2017-01-28 DIAGNOSIS — G9341 Metabolic encephalopathy: Secondary | ICD-10-CM | POA: Diagnosis present

## 2017-01-28 DIAGNOSIS — H109 Unspecified conjunctivitis: Secondary | ICD-10-CM | POA: Diagnosis present

## 2017-01-28 DIAGNOSIS — E1142 Type 2 diabetes mellitus with diabetic polyneuropathy: Secondary | ICD-10-CM | POA: Diagnosis present

## 2017-01-28 DIAGNOSIS — G9340 Encephalopathy, unspecified: Secondary | ICD-10-CM | POA: Diagnosis not present

## 2017-01-28 DIAGNOSIS — I2510 Atherosclerotic heart disease of native coronary artery without angina pectoris: Secondary | ICD-10-CM | POA: Diagnosis present

## 2017-01-28 DIAGNOSIS — R6510 Systemic inflammatory response syndrome (SIRS) of non-infectious origin without acute organ dysfunction: Secondary | ICD-10-CM | POA: Diagnosis not present

## 2017-01-28 DIAGNOSIS — I248 Other forms of acute ischemic heart disease: Secondary | ICD-10-CM | POA: Diagnosis present

## 2017-01-28 DIAGNOSIS — M797 Fibromyalgia: Secondary | ICD-10-CM | POA: Diagnosis present

## 2017-01-28 DIAGNOSIS — Z91041 Radiographic dye allergy status: Secondary | ICD-10-CM

## 2017-01-28 DIAGNOSIS — H5789 Other specified disorders of eye and adnexa: Secondary | ICD-10-CM

## 2017-01-28 DIAGNOSIS — L8990 Pressure ulcer of unspecified site, unspecified stage: Secondary | ICD-10-CM | POA: Insufficient documentation

## 2017-01-28 DIAGNOSIS — I360 Nonrheumatic tricuspid (valve) stenosis: Secondary | ICD-10-CM | POA: Diagnosis not present

## 2017-01-28 DIAGNOSIS — I252 Old myocardial infarction: Secondary | ICD-10-CM

## 2017-01-28 DIAGNOSIS — Z951 Presence of aortocoronary bypass graft: Secondary | ICD-10-CM

## 2017-01-28 DIAGNOSIS — R4182 Altered mental status, unspecified: Secondary | ICD-10-CM

## 2017-01-28 DIAGNOSIS — E119 Type 2 diabetes mellitus without complications: Secondary | ICD-10-CM | POA: Diagnosis not present

## 2017-01-28 DIAGNOSIS — Z885 Allergy status to narcotic agent status: Secondary | ICD-10-CM

## 2017-01-28 DIAGNOSIS — N179 Acute kidney failure, unspecified: Secondary | ICD-10-CM | POA: Diagnosis not present

## 2017-01-28 DIAGNOSIS — G609 Hereditary and idiopathic neuropathy, unspecified: Secondary | ICD-10-CM | POA: Diagnosis present

## 2017-01-28 DIAGNOSIS — R4701 Aphasia: Secondary | ICD-10-CM | POA: Diagnosis present

## 2017-01-28 DIAGNOSIS — A419 Sepsis, unspecified organism: Principal | ICD-10-CM | POA: Diagnosis present

## 2017-01-28 DIAGNOSIS — Z79899 Other long term (current) drug therapy: Secondary | ICD-10-CM

## 2017-01-28 DIAGNOSIS — I5043 Acute on chronic combined systolic (congestive) and diastolic (congestive) heart failure: Secondary | ICD-10-CM | POA: Diagnosis present

## 2017-01-28 DIAGNOSIS — R400 Somnolence: Secondary | ICD-10-CM | POA: Diagnosis not present

## 2017-01-28 DIAGNOSIS — J810 Acute pulmonary edema: Secondary | ICD-10-CM | POA: Diagnosis not present

## 2017-01-28 DIAGNOSIS — I481 Persistent atrial fibrillation: Secondary | ICD-10-CM | POA: Diagnosis not present

## 2017-01-28 DIAGNOSIS — Z8673 Personal history of transient ischemic attack (TIA), and cerebral infarction without residual deficits: Secondary | ICD-10-CM

## 2017-01-28 DIAGNOSIS — Z888 Allergy status to other drugs, medicaments and biological substances status: Secondary | ICD-10-CM

## 2017-01-28 DIAGNOSIS — Z8249 Family history of ischemic heart disease and other diseases of the circulatory system: Secondary | ICD-10-CM

## 2017-01-28 DIAGNOSIS — I5033 Acute on chronic diastolic (congestive) heart failure: Secondary | ICD-10-CM | POA: Diagnosis not present

## 2017-01-28 LAB — BASIC METABOLIC PANEL
ANION GAP: 12 (ref 5–15)
BUN: 22 mg/dL — ABNORMAL HIGH (ref 6–20)
CHLORIDE: 89 mmol/L — AB (ref 101–111)
CO2: 40 mmol/L — AB (ref 22–32)
Calcium: 8.7 mg/dL — ABNORMAL LOW (ref 8.9–10.3)
Creatinine, Ser: 1.4 mg/dL — ABNORMAL HIGH (ref 0.61–1.24)
GFR calc non Af Amer: 55 mL/min — ABNORMAL LOW (ref >60)
GLUCOSE: 188 mg/dL — AB (ref 65–99)
Potassium: 3.5 mmol/L (ref 3.5–5.1)
Sodium: 141 mmol/L (ref 135–145)

## 2017-01-28 LAB — URINALYSIS, ROUTINE W REFLEX MICROSCOPIC
BILIRUBIN URINE: NEGATIVE
GLUCOSE, UA: NEGATIVE mg/dL
Ketones, ur: NEGATIVE mg/dL
Nitrite: NEGATIVE
PROTEIN: 100 mg/dL — AB
Specific Gravity, Urine: 1.009 (ref 1.005–1.030)
Squamous Epithelial / LPF: NONE SEEN
pH: 6 (ref 5.0–8.0)

## 2017-01-28 LAB — RAPID URINE DRUG SCREEN, HOSP PERFORMED
Amphetamines: NOT DETECTED
Barbiturates: NOT DETECTED
Benzodiazepines: NOT DETECTED
Cocaine: NOT DETECTED
OPIATES: POSITIVE — AB
TETRAHYDROCANNABINOL: NOT DETECTED

## 2017-01-28 LAB — LACTIC ACID, PLASMA: Lactic Acid, Venous: 1.9 mmol/L (ref 0.5–1.9)

## 2017-01-28 LAB — TROPONIN I
TROPONIN I: 0.07 ng/mL — AB (ref <0.03)
Troponin I: 0.03 ng/mL (ref <0.03)
Troponin I: 0.04 ng/mL (ref <0.03)

## 2017-01-28 LAB — CBC WITH DIFFERENTIAL/PLATELET
BASOS PCT: 1 %
Basophils Absolute: 0.1 10*3/uL (ref 0.0–0.1)
Eosinophils Absolute: 0.3 10*3/uL (ref 0.0–0.7)
Eosinophils Relative: 2 %
HCT: 47.1 % (ref 39.0–52.0)
Hemoglobin: 13.6 g/dL (ref 13.0–17.0)
LYMPHS ABS: 1.3 10*3/uL (ref 0.7–4.0)
Lymphocytes Relative: 9 %
MCH: 32 pg (ref 26.0–34.0)
MCHC: 28.9 g/dL — ABNORMAL LOW (ref 30.0–36.0)
MCV: 110.8 fL — ABNORMAL HIGH (ref 78.0–100.0)
MONO ABS: 2.2 10*3/uL — AB (ref 0.1–1.0)
Monocytes Relative: 15 %
NEUTROS ABS: 10.5 10*3/uL — AB (ref 1.7–7.7)
Neutrophils Relative %: 73 %
PLATELETS: 240 10*3/uL (ref 150–400)
RBC: 4.25 MIL/uL (ref 4.22–5.81)
RDW: 13 % (ref 11.5–15.5)
WBC: 14.4 10*3/uL — AB (ref 4.0–10.5)

## 2017-01-28 LAB — COMPREHENSIVE METABOLIC PANEL
ALBUMIN: 2.9 g/dL — AB (ref 3.5–5.0)
ALT: 53 U/L (ref 17–63)
AST: 27 U/L (ref 15–41)
Alkaline Phosphatase: 115 U/L (ref 38–126)
Anion gap: 10 (ref 5–15)
BILIRUBIN TOTAL: 0.6 mg/dL (ref 0.3–1.2)
BUN: 22 mg/dL — AB (ref 6–20)
CALCIUM: 9.2 mg/dL (ref 8.9–10.3)
CO2: 39 mmol/L — AB (ref 22–32)
CREATININE: 1.48 mg/dL — AB (ref 0.61–1.24)
Chloride: 95 mmol/L — ABNORMAL LOW (ref 101–111)
GFR calc Af Amer: 60 mL/min — ABNORMAL LOW (ref >60)
GFR, EST NON AFRICAN AMERICAN: 52 mL/min — AB (ref >60)
GLUCOSE: 209 mg/dL — AB (ref 65–99)
Potassium: 3.6 mmol/L (ref 3.5–5.1)
Sodium: 144 mmol/L (ref 135–145)
Total Protein: 7.4 g/dL (ref 6.5–8.1)

## 2017-01-28 LAB — CBC
HEMATOCRIT: 45 % (ref 39.0–52.0)
HEMOGLOBIN: 12.7 g/dL — AB (ref 13.0–17.0)
MCH: 31.5 pg (ref 26.0–34.0)
MCHC: 28.2 g/dL — ABNORMAL LOW (ref 30.0–36.0)
MCV: 111.7 fL — ABNORMAL HIGH (ref 78.0–100.0)
Platelets: 240 10*3/uL (ref 150–400)
RBC: 4.03 MIL/uL — AB (ref 4.22–5.81)
RDW: 13 % (ref 11.5–15.5)
WBC: 12.4 10*3/uL — ABNORMAL HIGH (ref 4.0–10.5)

## 2017-01-28 LAB — PHOSPHORUS: PHOSPHORUS: 3.9 mg/dL (ref 2.5–4.6)

## 2017-01-28 LAB — I-STAT CG4 LACTIC ACID, ED
Lactic Acid, Venous: 0.99 mmol/L (ref 0.5–1.9)
Lactic Acid, Venous: 1.38 mmol/L (ref 0.5–1.9)

## 2017-01-28 LAB — CREATININE, SERUM
CREATININE: 1.45 mg/dL — AB (ref 0.61–1.24)
GFR calc Af Amer: >60 mL/min (ref >60)
GFR, EST NON AFRICAN AMERICAN: 53 mL/min — AB (ref >60)

## 2017-01-28 LAB — PROTIME-INR
INR: 1.14
Prothrombin Time: 14.7 seconds (ref 11.4–15.2)

## 2017-01-28 LAB — GLUCOSE, CAPILLARY
Glucose-Capillary: 205 mg/dL — ABNORMAL HIGH (ref 65–99)
Glucose-Capillary: 190 mg/dL — ABNORMAL HIGH (ref 65–99)

## 2017-01-28 LAB — MAGNESIUM: MAGNESIUM: 1.9 mg/dL (ref 1.7–2.4)

## 2017-01-28 LAB — CBG MONITORING, ED: GLUCOSE-CAPILLARY: 190 mg/dL — AB (ref 65–99)

## 2017-01-28 LAB — CREATININE, URINE, RANDOM: CREATININE, URINE: 35.98 mg/dL

## 2017-01-28 LAB — CK: Total CK: 22 U/L — ABNORMAL LOW (ref 49–397)

## 2017-01-28 LAB — BRAIN NATRIURETIC PEPTIDE: B Natriuretic Peptide: 394.6 pg/mL — ABNORMAL HIGH (ref 0.0–100.0)

## 2017-01-28 LAB — AMMONIA: AMMONIA: 49 umol/L — AB (ref 9–35)

## 2017-01-28 LAB — MRSA PCR SCREENING: MRSA by PCR: NEGATIVE

## 2017-01-28 MED ORDER — SODIUM CHLORIDE 0.9% FLUSH
3.0000 mL | Freq: Two times a day (BID) | INTRAVENOUS | Status: DC
  Administered 2017-01-28 – 2017-02-03 (×12): 3 mL via INTRAVENOUS

## 2017-01-28 MED ORDER — CARBAMAZEPINE 200 MG PO TABS
200.0000 mg | ORAL_TABLET | Freq: Every day | ORAL | Status: DC
  Administered 2017-01-29: 200 mg via ORAL
  Filled 2017-01-28 (×2): qty 1

## 2017-01-28 MED ORDER — ALBUTEROL SULFATE (2.5 MG/3ML) 0.083% IN NEBU: 2.5000 mg | INHALATION_SOLUTION | RESPIRATORY_TRACT | Status: DC | PRN

## 2017-01-28 MED ORDER — LACTULOSE 10 GM/15ML PO SOLN
30.0000 g | Freq: Once | ORAL | Status: DC
  Filled 2017-01-28 (×2): qty 45

## 2017-01-28 MED ORDER — SODIUM CHLORIDE 0.9% FLUSH: 3.0000 mL | INTRAVENOUS | Status: DC | PRN

## 2017-01-28 MED ORDER — ISOSORBIDE MONONITRATE ER 30 MG PO TB24
30.0000 mg | ORAL_TABLET | Freq: Two times a day (BID) | ORAL | Status: DC
  Administered 2017-01-29 – 2017-02-03 (×11): 30 mg via ORAL
  Filled 2017-01-28 (×11): qty 1

## 2017-01-28 MED ORDER — CARBAMAZEPINE 200 MG PO TABS: 200.0000 mg | ORAL_TABLET | Freq: Two times a day (BID) | ORAL | Status: DC

## 2017-01-28 MED ORDER — ACETAMINOPHEN 325 MG PO TABS
650.0000 mg | ORAL_TABLET | ORAL | Status: DC | PRN
  Administered 2017-01-29: 650 mg via ORAL
  Filled 2017-01-28: qty 2

## 2017-01-28 MED ORDER — FUROSEMIDE 10 MG/ML IJ SOLN
60.0000 mg | Freq: Two times a day (BID) | INTRAMUSCULAR | Status: AC
  Administered 2017-01-28 – 2017-01-29 (×2): 60 mg via INTRAVENOUS
  Filled 2017-01-28 (×2): qty 6

## 2017-01-28 MED ORDER — DILTIAZEM LOAD VIA INFUSION
10.0000 mg | Freq: Once | INTRAVENOUS | Status: AC
  Administered 2017-01-28: 10 mg via INTRAVENOUS
  Filled 2017-01-28: qty 10

## 2017-01-28 MED ORDER — DEXTROSE 5 % IV SOLN
1.0000 g | INTRAVENOUS | Status: DC
  Administered 2017-01-29 – 2017-02-02 (×5): 1 g via INTRAVENOUS
  Filled 2017-01-28 (×6): qty 10

## 2017-01-28 MED ORDER — ALBUTEROL (5 MG/ML) CONTINUOUS INHALATION SOLN
10.0000 mg/h | INHALATION_SOLUTION | RESPIRATORY_TRACT | Status: AC
  Administered 2017-01-28: 10 mg/h via RESPIRATORY_TRACT
  Filled 2017-01-28: qty 20

## 2017-01-28 MED ORDER — SODIUM CHLORIDE 0.9 % IV SOLN: 250.0000 mL | INTRAVENOUS | Status: DC | PRN

## 2017-01-28 MED ORDER — DEXTROSE 5 % IV SOLN
1.0000 g | Freq: Once | INTRAVENOUS | Status: AC
  Administered 2017-01-28: 1 g via INTRAVENOUS
  Filled 2017-01-28: qty 10

## 2017-01-28 MED ORDER — ARTIFICIAL TEARS OP OINT
1.0000 "application " | TOPICAL_OINTMENT | Freq: Every day | OPHTHALMIC | Status: DC
  Administered 2017-01-28 – 2017-02-02 (×6): 1 via OPHTHALMIC
  Filled 2017-01-28 (×2): qty 3.5

## 2017-01-28 MED ORDER — ONDANSETRON HCL 4 MG/2ML IJ SOLN: 4.0000 mg | Freq: Four times a day (QID) | INTRAMUSCULAR | Status: DC | PRN

## 2017-01-28 MED ORDER — ENOXAPARIN SODIUM 40 MG/0.4ML ~~LOC~~ SOLN
40.0000 mg | SUBCUTANEOUS | Status: DC
  Administered 2017-01-29: 40 mg via SUBCUTANEOUS
  Filled 2017-01-28: qty 0.4

## 2017-01-28 MED ORDER — METOPROLOL TARTRATE 5 MG/5ML IV SOLN
5.0000 mg | Freq: Once | INTRAVENOUS | Status: AC
  Administered 2017-01-28: 5 mg via INTRAVENOUS
  Filled 2017-01-28: qty 5

## 2017-01-28 MED ORDER — TOPIRAMATE 100 MG PO TABS
100.0000 mg | ORAL_TABLET | Freq: Every day | ORAL | Status: DC
  Administered 2017-01-29 – 2017-02-03 (×6): 100 mg via ORAL
  Filled 2017-01-28 (×6): qty 1

## 2017-01-28 MED ORDER — FUROSEMIDE 10 MG/ML IJ SOLN
40.0000 mg | Freq: Once | INTRAMUSCULAR | Status: AC
  Administered 2017-01-28: 40 mg via INTRAVENOUS
  Filled 2017-01-28: qty 4

## 2017-01-28 MED ORDER — ATENOLOL 50 MG PO TABS
50.0000 mg | ORAL_TABLET | Freq: Two times a day (BID) | ORAL | Status: DC
  Administered 2017-01-29 – 2017-02-01 (×7): 50 mg via ORAL
  Filled 2017-01-28 (×7): qty 1

## 2017-01-28 MED ORDER — DILTIAZEM HCL-DEXTROSE 100-5 MG/100ML-% IV SOLN (PREMIX)
5.0000 mg/h | INTRAVENOUS | Status: DC
  Administered 2017-01-28: 5 mg/h via INTRAVENOUS
  Administered 2017-01-29 – 2017-01-30 (×2): 7.5 mg/h via INTRAVENOUS
  Filled 2017-01-28 (×4): qty 100

## 2017-01-28 MED ORDER — HYDRALAZINE HCL 20 MG/ML IJ SOLN: 10.0000 mg | Freq: Three times a day (TID) | INTRAMUSCULAR | Status: DC | PRN

## 2017-01-28 NOTE — ED Triage Notes (Signed)
Pt arrives from home via [facility] reporting AMS, LSN 1100 yesterday.  Pt alert to self and place, disoriented to time and situation. Pt soaked in strong smelling urine.  COugh noted.

## 2017-01-28 NOTE — ED Provider Notes (Signed)
The patient is a 56 year old male, he does have a history of brain tumor, coronary disease, CHF hypertension and diabetes. Today his significant other states that he had altered mental status. He was found by family, EMS found the patient on the recliner with some difficulty speaking, facial droop. The wife states that the facial droop was after a I surgery on the right but it had improved. She states that he was able to smile completely and talk normally with out any difficulty with mental status last night when she last saw him. The patient denies any specific complaints other than feeling like he has mucus in his mouth. He denies coughing but has chronic shortness of breath. He has significant and severe peripheral swelling of his lower extremities. He has no abdominal tenderness to palpation.  The patient has equal pupils, he has right-sided facial droop, he is unable to lift his right eyebrow. He has equal grips in the upper extremities and equal strength in the lower extremities. He has no significant rashes to his skin. He does have some wheezing in his lung fields. He is hypoxic, he will need albuterol treatment, neurology has been consulted to see him. He will need to be admitted to the hospital for further evaluation of his altered mental status.  Medical screening examination/treatment/procedure(s) were conducted as a shared visit with non-physician practitioner(s) and myself.  I personally evaluated the patient during the encounter.  Clinical Impression:   Final diagnoses:  Altered mental status, unspecified altered mental status type  Urinary tract infection without hematuria, site unspecified  AKI (acute kidney injury) ([place])  Acute on chronic congestive heart failure, unspecified heart failure type [facility])       EKG Interpretation  Date/Time:  Sunday January 28 2017 10:38:00 EDT Ventricular Rate:  60 PR Interval:    QRS Duration: 107 QT Interval:  439 QTC Calculation: 439 R  Axis:   121 Text Interpretation:  Sinus rhythm Right axis deviation Since last tracing rate slower Confirmed by Noemi Chapel [phone]) on 01/28/2017 12:08:07 PM         Noemi Chapel, MD 01/30/17 [phone]

## 2017-01-28 NOTE — Consult Note (Signed)
Neurology Consult Note  Reason for Consultation: Right facial droop, confusion  Requesting provider: Shary Decamp, PA  CC: "I want something to drink"   HPI: This is a 56 year old man who was brought to the emergency department by EMS for evaluation of altered mental status. History is obtained from the patient's wife was present at the bedside.  His wife reports that the patient was in his usual state of health last night when he went to bed. This morning, she tried to wake him up so she could give him a bath. She states that she was unable to keep him awake, noting that he would open his eyes for a few seconds and then go right back to sleep. She decided to call 911 because this was unusual for the patient. He was brought to the emergency department where he has been noted to be encephalopathic. He is oriented to self and place only. He will follow commands. He was noted to have right facial weakness that included the forehead which his wife reported was new, prompted this neurology consultation.  On my assessment, the patient is encephalopathic. He is able to participate with the examination. Speed of processing is slow, however. He does have a peripheral pattern of weakness on the right side of his face but when I asked his wife she says that his face actually looks normal for him. On review his records, he had a right acoustic neuroma resected in 2014.  PMH:  Past Medical History:  Diagnosis Date  . Brain tumor ([place])    Takes Topamax so patient will not have headaches  . Bruises easily   . CAD (coronary artery disease)    Cath 09, 99% LAD  . Cardiomyopathy, ischemic    Ischemic  . CHF (congestive heart failure) ([place])   . Cyst near coccyx   . Cyst near tailbone   . Diabetes mellitus without complication (H[facility])    borderline  . Edema of both legs    Takes Lasix  . Fibromyalgia   . Gout   . Headache(784.0)    with lights  . Hypertension    dr Percival Spanish  . Kidney stones   .  Myocardial infarction ([place])   . Obesity   . Pneumonia    hx of  . Shortness of breath   . Stroke [facility])    Affected vision, walking, and facial drooping on right face    PSH:  Past Surgical History:  Procedure Laterality Date  . 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 ANAL FISTULECTOMY    . CORONARY ARTERY BYPASS GRAFT     LIMA to LAD 2009  . CRANIOTOMY Right 11/20/2012   Procedure: CRANIOTOMY REPAIR DURAL/CENTRAL SPINAL FLUID LEAK;  Surgeon: [name], MD;  Location: [place] NEURO ORS;  Service: Neurosurgery;  Laterality: Right;  . EYE SURGERY     to coorect lid and double vision  . MEDIAN RECTUS REPAIR Right 02/16/2016   Procedure: RIGHT LATERAL RECTUS RESECTION; SUPERIOR RECTUS RESECTION RIGHT EYE; RIGHT SUPERIOR RECTUS RECESSION; LATERAL TARSAL STRIP RIGHT LOWER EYELID;  Surgeon: [name], MD;  Location: [place];  Service: Ophthalmology;  Laterality: Right;  . PLACEMENT OF LUMBAR DRAIN N/A 11/20/2012   Procedure: Attempted PLACEMENT OF LUMBAR DRAIN;  Surgeon: [name], MD;  Location: [place] NEURO ORS;  Service: Neurosurgery;  Laterality: N/A;  . RETROSIGMOID  CRANIECTOMY FOR TUMOR RESECTION Right 10/11/2012   Procedure: RETROSIGMOID CRANIECTOMY FOR TUMOR RESECTION;  Surgeon: [name], MD;  Location: [place] NEURO ORS;  Service: Neurosurgery;  Laterality: Right;  Craniotomy for acoustic neuroma  . TRACHEOSTOMY TUBE PLACEMENT N/A 10/11/2012   Procedure: TRACHEOSTOMY;  Surgeon: Ascencion Dike, MD;  Location: [facility]UR[facility];  Service: ENT;  Laterality: N/A;  . UMBILICAL HERNIA REPAIR      Family history: Family History  Problem Relation Age of Onset  . CAD Father   . Hypertension Mother   . Cancer Other        multiple relatives    Social history:  Social History   Social History  . Marital status: Married    Spouse name: N/A  . Number of children:  N/A  . Years of education: N/A   Occupational History  . Not on file.   Social History Main Topics  . Smoking status: Light Tobacco Smoker    Packs/day: 0.25    Years: 20.00    Types: Cigarettes  . Smokeless tobacco: Current User     Comment: Rare tobacco.  "[name]".  . Alcohol use No  . Drug use: No  . Sexual activity: Yes   Other Topics Concern  . Not on file   Social History Narrative  . No narrative on file    Current outpatient meds: Medications reviewed and reconciled.  Current Meds  Medication Sig  . artificial tears (LACRILUBE) OINT ophthalmic ointment Place 1 application into the right eye at bedtime.   Marland Kitchen atenolol (TENORMIN) 50 MG tablet Take 1 tablet (50 mg total) by mouth 2 (two) times daily. Note increased dose.  . furosemide (LASIX) 40 MG tablet Take 1 tablet (40 mg total) by mouth daily.  Marland Kitchen HYDROcodone-acetaminophen (NORCO) 10-325 MG tablet TAKE 1 TABLET BY MOUTH EVERY 6 HOURS AS NEEDED FOR PAIN MUST LAST 30 DAYS  . nystatin (NYSTATIN) powder Apply 1 g topically 2 (two) times daily as needed. 90 day supply  . Skin Protectants, Misc. (EUCERIN) cream Apply 1 application topically 3 (three) times daily.  Marland Kitchen tiZANidine (ZANAFLEX) 4 MG tablet TAKE 1 TABLET THREE TIMES DAILY AS NEEDED FOR MUSCLE SPASM(S)  . tobramycin-dexamethasone (TOBRADEX) ophthalmic ointment Place 1 application into the right eye 2 (two) times daily at 10 am and 4 pm.  . topiramate (TOPAMAX) 100 MG tablet Take 1 tablet (100 mg total) by mouth daily. 3 month rx    Current inpatient meds: Medications reviewed and reconciled.  Current Facility-Administered Medications  Medication Dose Route Frequency Provider Last Rate Last Dose  . cefTRIAXone (ROCEPHIN) 1 g in dextrose 5 % 50 mL IVPB  1 g Intravenous Once Shary Decamp, PA-C      . furosemide (LASIX) injection 40 mg  40 mg Intravenous Once Shary Decamp, PA-C       Current Outpatient Prescriptions  Medication Sig Dispense Refill  .  artificial tears (LACRILUBE) OINT ophthalmic ointment Place 1 application into the right eye at bedtime.     Marland Kitchen atenolol (TENORMIN) 50 MG tablet Take 1 tablet (50 mg total) by mouth 2 (two) times daily. Note increased dose. 60 tablet 1  . furosemide (LASIX) 40 MG tablet Take 1 tablet (40 mg total) by mouth daily. 30 tablet 1  . HYDROcodone-acetaminophen (NORCO) 10-325 MG tablet TAKE 1 TABLET BY MOUTH EVERY 6 HOURS AS NEEDED FOR PAIN MUST LAST 30 DAYS 120 tablet 0  . nystatin (NYSTATIN) powder Apply 1 g  topically 2 (two) times daily as needed. 90 day supply 180 g 3  . Skin Protectants, Misc. (EUCERIN) cream Apply 1 application topically 3 (three) times daily.    Marland Kitchen tiZANidine (ZANAFLEX) 4 MG tablet TAKE 1 TABLET THREE TIMES DAILY AS NEEDED FOR MUSCLE SPASM(S) 270 tablet 3  . tobramycin-dexamethasone (TOBRADEX) ophthalmic ointment Place 1 application into the right eye 2 (two) times daily at 10 am and 4 pm. 3.5 g 0  . topiramate (TOPAMAX) 100 MG tablet Take 1 tablet (100 mg total) by mouth daily. 3 month rx 30 tablet 5  . albuterol (PROVENTIL HFA;VENTOLIN HFA) 108 (90 BASE) MCG/ACT inhaler Inhale 2 puffs into the lungs every 6 (six) hours as needed for wheezing or shortness of breath. 1 Inhaler 1  . antiseptic oral rinse (BIOTENE) LIQD 15 mLs by Mouth Rinse route as needed for dry mouth.     . benzocaine (ORAJEL) 10 % mucosal gel Use as directed 1 application in the mouth or throat 2 (two) times daily as needed for pain.    . carbamazepine (TEGRETOL) 200 MG tablet Take 1 tablet (200 mg total) by mouth 2 (two) times daily. Take once at night for 4 days then twice daily thereafter 60 tablet 5  . colchicine 0.6 MG tablet Take 1 tablet (0.6 mg total) by mouth daily. (Patient taking differently: Take 0.6 mg by mouth daily as needed. ) 30 tablet 1  . isosorbide mononitrate (IMDUR) 30 MG 24 hr tablet Take 30 mg by mouth 2 (two) times daily.    . sucralfate (CARAFATE) 1 g tablet Take 1 tablet (1 g total) by mouth  4 (four) times daily -  with meals and at bedtime. 30 tablet 0    Allergies: Allergies  Allergen Reactions  . Morphine And Related Anaphylaxis    "makes me stop breathing"  . Ivp Dye [Iodinated Diagnostic Agents]     Passing out  . Other     Steroids makes hearts race    ROS: As per HPI. A full 14-point review of systems was performed and is otherwise unremarkable. This is limited by his encephalopathy, however.  PE:  BP (!) 139/55   Pulse (!) 54   Temp 98.1 F (36.7 C) (Oral)   Resp 20   Wt (!) 157.4 kg (347 lb)   SpO2 92%   BMI 49.79 kg/m   General: Morbidly obese Caucasian man lying on a gurney. He is encephalopathic. He is oriented to self and [facility]. Moderate thought blocking is apparent. No obvious aphasia. Speech is mildly slurred. HEENT: Normocephalic. Neck supple without LAD. Mucous membranes appear dry, OP clear. Sclerae anicteric. Mild conjunctival injection. Dried mucous present around both eyes. CV: Regular, 2/6 systolic murmur RUSB>LUSB.  Lungs: CTAB on anterior examination, though somewhat limited by body habitus.  Abdomen: Soft, morbidly obese, non-tender. Bowel sounds present x4.  Extremities: He has substantial edema both lower extremities. Neuro:  CN: Pupils are equal and round. They are symmetrically reactive from 3-->2 mm. he has breakup of smooth pursuits in all directions. No reported diplopia. Corneals are intact bilaterally. He has a right peripheral VII pattern of weakness. Tongue is midline with normal bulk and mobility.  Motor: Normal bulk, tone. Strength appears normal in the upper extremities. Distal lower extremity strength is 4 to 4+/5 with variable effort. He has difficulty raising his legs off the bed but this may be due to body habitus and position. No tremor or other abnormal movements.   Sensation: Limited by  poor attention.  DTRs: Trace throughout.  Coordination: Finger-to-nose is slow but without dysmetria.   Labs:  Lab Results   Component Value Date   WBC 14.4 (H) 01/28/2017   HGB 13.6 01/28/2017   HCT 47.1 01/28/2017   PLT 240 01/28/2017   GLUCOSE 209 (H) 01/28/2017   CHOL 156 02/22/2016   TRIG 129 02/22/2016   HDL 31 (L) 02/22/2016   LDLCALC 99 02/22/2016   ALT 53 01/28/2017   AST 27 01/28/2017   NA 144 01/28/2017   K 3.6 01/28/2017   CL 95 (L) 01/28/2017   CREATININE 1.48 (H) 01/28/2017   BUN 22 (H) 01/28/2017   CO2 39 (H) 01/28/2017   TSH 0.750 02/22/2016   INR 1.14 01/28/2017   HGBA1C 5.6 02/22/2016   BNP 394.6 Serum ammonia 49 CK 22 Urinalysis notable for moderate hemoglobin, protein 100, large leukocyte esterase, negative nitrites, white blood cells too numerous to count Urine drug screen positive for opiates Lactate 0.99  Imaging:  I have personally and independently reviewed CT scan of the head without contrast from today. No obvious acute abnormality is seen. There is focal encephalomalacia involving the right cerebellar pontine angle consistent with history of right acoustic neuroma resection. Evidence of prior right suboccipital craniectomy is present. No obvious acute abnormalities.  Assessment and Plan:  1. Acute encephalopathy: This is most likely multifactorial in etiology with potential contributions from UTI, renal dysfunction, mild hyperammonemia, medication effect. Continue to optimize metabolic status as you are. Continue to treat any underlying infection. Minimize the use of opiates, benzos or any medication with strong anticholinergic properties as much as possible. Optimize sleep-wake cycles as much as you can by keeping the room bright with activity during the day and dark and quiet at night. For agitation, recommend low-dose haloperidol or an atypical antipsychotic.   2. Right facial weakness: He has a peripheral pattern of weakness on the right side of his face. I suspect that this is residual from his prior acoustic neuroma resection as this is a common complication of that  procedure. His wife tells me that she does not appreciate any obvious difference in his face this morning.  This was discussed with the patient's wife. Education was provided on the diagnosis and expected evaluation and treatment. She is in agreement with the plan as noted. She was given the opportunity to ask any questions and these were addressed to her satisfaction.   I have no additional recommendations at this time and will sign off. Please call if any new issues should arise.  My impression and recommendations were discussed with the ED PA at the time of my visit.

## 2017-01-28 NOTE — ED Provider Notes (Signed)
[facility] DEPT Provider Note   CSN: [id number] Arrival date & time: 01/28/17  1022     History   Chief Complaint Chief Complaint  Patient Frank with  . Altered Mental Status    HPI Royce Stegman. is a 56 y.o. male.  HPI  56 y.o. male with a hx of Brain Arellano, [name], [name], [name], [name], Frank to the Emergency Department today due to AMS. Found by family today PTA. Notified EMS. Found on recliner with aphasia, facial droop. Per wife, pt had facial droop post eye surgery on right, but improved. Pt wife states that he was able to smile completely and talk normally with complete normal mental status last night around 11pm. Found at 8am to be unresponsive with facial droop. Notified EMS and brought to ED. HPI limited due to acuity of condition. Pt able to answer questions appropriately at intermittent times.   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 Christella Noa   Level Caveat V: AMS  Past Medical History:  Diagnosis Date  . 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 Bruises easily   . [name] (coronary artery disease)    Cath 09, 99% LAD  . Cardiomyopathy, ischemic    Ischemic  . [name] (congestive heart failure) ([place])   . Cyst near coccyx   . Cyst near tailbone   . Diabetes mellitus without complication (HCC)    borderline  . Edema of both legs    Takes Lasix  . Fibromyalgia   . Gout   . Headache(784.0)    with lights  . Hypertension    dr Percival Spanish  . Kidney stones   . Myocardial infarction ([place])   . Obesity   . Pneumonia    hx of  . Shortness of breath   . Stroke [facility])    Affected vision, walking, and facial drooping on right face    Patient Active Problem List   Diagnosis Date Noted  . Hereditary and idiopathic peripheral neuropathy 01/08/2017  . Acute on chronic respiratory failure ([place]) 02/16/2016  . Irritation of right eye 10/18/2015  . Facial nerve  injury (right) 12/12/2013  . Headache due to intracranial disease 10/13/2013  . Cellulitis and abscess of leg 06/16/2013  . Edema 12/17/2012  . Postoperative CSF leak 11/29/2012  . Gout flare 11/12/2012  . Acute renal insufficiency 11/12/2012  . Purulent bronchitis ([place]) 10/28/2012  . Serratia infection 10/24/2012  . 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 Leucocytosis 10/24/2012  . Impaired fasting glucose 10/24/2012  . Tracheostomy status ([place]) 10/17/2012  . Acoustic neuroma ([place]) 10/12/2012  . Status post craniotomy 10/12/2012  . Respiratory failure, post-operative ([place]) 10/12/2012  . [name] (coronary artery disease) of artery bypass graft 09/09/2012  . 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 Preop cardiovascular exam 09/09/2012  . Morbid obesity ([place]) 09/09/2012  . Tobacco abuse 09/09/2012    Past Surgical History:  Procedure Laterality Date  . 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 ANAL FISTULECTOMY    . CORONARY ARTERY BYPASS GRAFT     LIMA to LAD 2009  . CRANIOTOMY Right 11/20/2012   Procedure: CRANIOTOMY REPAIR DURAL/CENTRAL SPINAL FLUID LEAK;  Surgeon: [name], MD;  Location: [place] NEURO ORS;  Service: Neurosurgery;  Laterality: Right;  . EYE SURGERY     to coorect lid and double vision  . MEDIAN RECTUS REPAIR Right 02/16/2016   Procedure: RIGHT LATERAL RECTUS RESECTION; SUPERIOR RECTUS RESECTION RIGHT EYE; RIGHT SUPERIOR RECTUS RECESSION; LATERAL TARSAL STRIP RIGHT LOWER EYELID;  Surgeon: [name], MD;  Location: [place];  Service: Ophthalmology;  Laterality: Right;  . PLACEMENT OF LUMBAR DRAIN N/A 11/20/2012   Procedure: Attempted PLACEMENT OF LUMBAR DRAIN;  Surgeon: [name], MD;  Location: [place] NEURO ORS;  Service: Neurosurgery;  Laterality: N/A;  . RETROSIGMOID CRANIECTOMY FOR Arellano RESECTION Right 10/11/2012   Procedure:  RETROSIGMOID CRANIECTOMY FOR Arellano RESECTION;  Surgeon: [name], MD;  Location: [place] NEURO ORS;  Service: Neurosurgery;  Laterality: Right;  Craniotomy for acoustic neuroma  . TRACHEOSTOMY TUBE PLACEMENT N/A 10/11/2012   Procedure: TRACHEOSTOMY;  Surgeon: Ascencion Dike, MD;  Location: [facility];  Service: ENT;  Laterality: N/A;  . UMBILICAL HERNIA REPAIR         Home Medications    Prior to Admission medications   Medication Sig Start Date End Date Taking? Authorizing Provider  albuterol (PROVENTIL HFA;VENTOLIN HFA) 108 (90 BASE) MCG/ACT inhaler Inhale 2 puffs into the lungs every 6 (six) hours as needed for wheezing or shortness of breath. 11/12/12   Love, Ivan Anchors, PA-C  antiseptic oral rinse (BIOTENE) LIQD 15 mLs by Mouth Rinse route as needed for dry mouth.     [provider]  artificial tears (LACRILUBE) OINT ophthalmic ointment Place 1 application into the right eye at bedtime.     [provider]  atenolol (TENORMIN) 50 MG tablet Take 1 tablet (50 mg total) by mouth 2 (two) times daily. Note increased dose. 11/12/12   Love, Ivan Anchors, PA-C  benzocaine (ORAJEL) 10 % mucosal gel Use as directed 1 application in the mouth or throat 2 (two) times daily as needed for pain.    [provider]  carbamazepine (TEGRETOL) 200 MG tablet Take 1 tablet (200 mg total) by mouth 2 (two) times daily. Take once at night for 4 days then twice daily thereafter 01/08/17   Meredith Staggers, MD  colchicine 0.6 MG tablet Take 1 tablet (0.6 mg total) by mouth daily. Patient taking differently: Take 0.6 mg by mouth daily as needed.  11/12/12   Love, Ivan Anchors, PA-C  furosemide (LASIX) 40 MG tablet Take 1 tablet (40 mg total) by mouth daily. 02/23/16   Kelvin Cellar, MD  HYDROcodone-acetaminophen ([place]) 10-325 MG tablet TAKE 1 TABLET BY MOUTH EVERY 6 HOURS AS NEEDED FOR PAIN MUST LAST 30 DAYS 01/08/17   Meredith Staggers, MD  isosorbide mononitrate (IMDUR) 30 MG 24 hr tablet Take 30 mg  by mouth 2 (two) times daily.    [provider]  nystatin (NYSTATIN) powder Apply 1 g topically 2 (two) times daily as needed. 90 day supply 03/08/16   Bayard Hugger, NP  Skin Protectants, Misc. (EUCERIN) cream Apply 1 application topically 3 (three) times daily.    [provider]  sucralfate (CARAFATE) 1 g tablet Take 1 tablet (1 g total) by mouth 4 (four) times daily -  with meals and at bedtime. 02/23/16   Kelvin Cellar, MD  tiZANidine (ZANAFLEX) 4 MG tablet TAKE 1 TABLET THREE TIMES DAILY AS NEEDED FOR MUSCLE SPASM(S) 01/08/17   Meredith Staggers, MD  tobramycin-dexamethasone [facility]) ophthalmic ointment Place 1 application into the right eye 2 (two) times daily at 10 am and 4 pm.  02/23/16   Kelvin Cellar, MD  topiramate (TOPAMAX) 100 MG tablet Take 1 tablet (100 mg total) by mouth daily. 3 month rx 01/08/17   Meredith Staggers, MD    Family History Family History  Problem Relation Age of Onset  . [name] Father   . Hypertension Mother   . Cancer Other        multiple relatives    Social History Social History  Substance Use Topics  . Smoking status: Light Tobacco Smoker    Packs/day: 0.25    Years: 20.00    Types: Cigarettes  . Smokeless tobacco: Current User     Comment: Rare tobacco.  "[name]".  . Alcohol use No     Allergies   Morphine and related; Ivp dye [iodinated diagnostic agents]; and Other   Review of Systems Review of Systems  Unable to perform ROS: Mental status change   Physical Exam Updated Vital Signs BP (!) 122/95 (BP Location: Right Arm)   Pulse 63   Temp 98.1 F (36.7 C) (Oral)   Resp 18   Wt (!) 157.4 kg (347 lb)   SpO2 94%   BMI 49.79 kg/m   Physical Exam  Constitutional: He appears well-developed and well-nourished. No distress.  Morbidly Obese. Sickly appearing  HENT:  Head: Normocephalic and atraumatic.  Right Ear: Tympanic membrane, external ear and ear canal normal.  Left Ear: Tympanic membrane,  external ear and ear canal normal.  Nose: Nose normal.  Mouth/Throat: Uvula is midline, oropharynx is clear and moist and mucous membranes are normal. No trismus in the jaw. No oropharyngeal exudate, posterior oropharyngeal erythema or tonsillar abscesses.  Eyes: EOM are normal. Pupils are equal, round, and reactive to light.  Neck: Normal range of motion. Neck supple. No tracheal deviation present.  Cardiovascular: Normal rate, regular rhythm, S1 normal, S2 normal, normal heart sounds, intact distal pulses and normal pulses.   Pulmonary/Chest: Effort normal. No respiratory distress. He has decreased breath sounds in the right upper field, the right lower field, the left upper field and the left lower field. He has wheezes in the right upper field, the right lower field, the left upper field and the left lower field. He has no rhonchi. He has no rales.  Abdominal: Normal appearance and bowel sounds are normal. There is no tenderness.  Musculoskeletal: Normal range of motion.  3+ pitting edema BLE to proximal tibia  Neurological: He is alert. He has normal strength. He is disoriented. A cranial nerve deficit is present. No sensory deficit.  Cranial Nerves:  II: Pupils equal, round, reactive to light III,IV, VI: ptosis not present, extra-ocular motions intact bilaterally  V,VII: smile asymmetric with droop on right, Unable to raise right eyebrow. Left intact. Facial light touch sensation equal VIII: hearing grossly normal bilaterally  IX,X: midline uvula rise  XI: bilateral shoulder shrug equal and strong XII: midline tongue extension Equal grip strengths BUE/BLE. No atrophy. ROM intact.   Skin: Skin is warm and dry.  Psychiatric: He has a normal mood and affect. His speech is normal and behavior is normal. Thought content normal.  Nursing note and vitals reviewed.    ED Treatments / Results  Labs (all labs ordered are listed, but only abnormal results are displayed) Labs Reviewed    COMPREHENSIVE METABOLIC PANEL - Abnormal; Notable for the following:       Result Value   Chloride 95 (*)    CO2 39 (*)    Glucose, Bld 209 (*)  BUN 22 (*)    Creatinine, Ser 1.48 (*)    Albumin 2.9 (*)    GFR calc non Af Amer 52 (*)    GFR calc Af Amer 60 (*)    All other components within normal limits  CBC WITH DIFFERENTIAL/PLATELET - Abnormal; Notable for the following:    WBC 14.4 (*)    MCV 110.8 (*)    MCHC 28.9 (*)    Neutro Abs 10.5 (*)    Monocytes Absolute 2.2 (*)    All other components within normal limits  URINALYSIS, ROUTINE W REFLEX MICROSCOPIC - Abnormal; Notable for the following:    APPearance CLOUDY (*)    Hgb urine dipstick MODERATE (*)    Protein, ur 100 (*)    Leukocytes, UA LARGE (*)    Bacteria, UA RARE (*)    All other components within normal limits  BRAIN NATRIURETIC PEPTIDE - Abnormal; Notable for the following:    B Natriuretic Peptide 394.6 (*)    All other components within normal limits  AMMONIA - Abnormal; Notable for the following:    Ammonia 49 (*)    All other components within normal limits  RAPID URINE DRUG SCREEN, HOSP PERFORMED - Abnormal; Notable for the following:    Opiates POSITIVE (*)    All other components within normal limits  CK - Abnormal; Notable for the following:    Total CK 22 (*)    All other components within normal limits  TROPONIN I - Abnormal; Notable for the following:    Troponin I 0.07 (*)    All other components within normal limits  CBG MONITORING, ED - Abnormal; Notable for the following:    Glucose-Capillary 190 (*)    All other components within normal limits  CULTURE, BLOOD (ROUTINE X 2)  CULTURE, BLOOD (ROUTINE X 2)  URINE CULTURE  PROTIME-INR  I-STAT CG4 LACTIC ACID, ED    EKG  EKG Interpretation  Date/Time:  Sunday January 28 2017 10:38:00 EDT Ventricular Rate:  60 PR Interval:    QRS Duration: 107 QT Interval:  439 QTC Calculation: 439 R Axis:   121 Text Interpretation:  Sinus rhythm  Right axis deviation Since last tracing rate slower Confirmed by Noemi Chapel [phone]) on 01/28/2017 12:08:07 PM      Radiology Ct Head Wo Contrast  Result Date: 01/28/2017 CLINICAL DATA:  56 year old male with altered mental status, last seen normal 1100 hours yesterday. EXAM: CT HEAD WITHOUT CONTRAST TECHNIQUE: Contiguous axial images were obtained from the base of the skull through the vertex without intravenous contrast. COMPARISON:  Head CT without contrast 11/19/2012 and earlier. 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 Small chronic right lateral posterior fossa extra-axial hygroma has decreased since 2014. Questionable mild hypodensity in the left central cerebellum which would be new since 2014. Supratentorial gray-white matter differentiation is stable and within normal limits. No midline shift, ventriculomegaly, intracranial hemorrhage or evidence of cortically based acute infarction. Vascular: Mild Calcified atherosclerosis at the skull base. Skull: Chronic right suboccipital craniectomy changes again noted. An associated postoperative pseudomeningocele has resolved. Stable visualized osseous structures. Sinuses/Orbits: Mild mucosal thickening or small fluid level in the left maxillary sinus. Other paranasal sinuses are stable in clear. Right tympanic cavity and mastoids have cleared since 2014, but there is a new left mastoid effusion. The left tympanic cavity remains clear. Other: No acute orbit or scalp soft tissue findings. IMPRESSION: 1. Questionable hypodensity in the central left cerebellum, such as due to small  vessel ischemia, which would be new since 2014, but may be artifact. 2. Expected evolution of postoperative changes from 2014 right cerebellopontine angle resection. 3. Left mastoid effusion is new since 2014 and likely postinflammatory. Previously-seen right tympanic and mastoid effusion has resolved. Electronically Signed   By: Genevie Ann M.D.    On: 01/28/2017 11:59   Dg Chest Portable 1 View  Result Date: 01/28/2017 CLINICAL DATA:  Altered mental status. EXAM: PORTABLE CHEST 1 VIEW COMPARISON:  Single-view of the chest 02/21/2016 and 11/20/2012. FINDINGS: The patient is status post CABG. There is cardiomegaly and pulmonary edema. No consolidative process, pneumothorax or effusion is identified. No acute bony abnormality. IMPRESSION: Cardiomegaly and pulmonary edema. Electronically Signed   By: Inge Rise M.D.   On: 01/28/2017 11:11    Procedures Procedures (including critical care time)  Medications Ordered in ED Medications  furosemide (LASIX) injection 40 mg (not administered)  cefTRIAXone (ROCEPHIN) 1 g in dextrose 5 % 50 mL IVPB (not administered)  albuterol (PROVENTIL,VENTOLIN) solution continuous neb (10 mg/hr Nebulization New Bag/Given 01/28/17 1227)    Initial Impression / Assessment and Plan / ED Course  I have reviewed the triage vital signs and the nursing notes.  Pertinent labs & imaging results that were available during my care of the patient were reviewed by me and considered in my medical decision making (see chart for details).  Final Clinical Impressions(s) / ED Diagnoses  {I have reviewed and evaluated the relevant laboratory values. {I have reviewed and evaluated the relevant imaging studies. {I have interpreted the relevant EKG. {I have reviewed the relevant previous healthcare records. {I have reviewed EMS Documentation. {I obtained HPI from historian. {Patient discussed with supervising physician.  ED Course:  Assessment: Pt is a 56 y.o. male with hx Brain Arellano, [name], [name], [name], [name] who Frank due to AMS. LSN last night around 11p. Found this morning in recliner unresponsive and latered at 8pm. Noted right facial droop. Aphasia. Hx same in past s/p eye surgery, but resolved. Noted hx of large acoustic neuroma by Dr. Christella Noa that required retrosigmoid craniectomy. On exam, pt in NAD. Nontoxic/nonseptic  appearing. VSS. Afebrile. Lungs with diffuse wheeze, decreased. Heart RRR. Abdomen nontender soft. 3+ pitting edema noted BLE. CN evaluated and showed right facial asymmetry with droop and inability to raise eyebrow. Symptoms consistent with Bells Palsy. Consult to Neurology will see inED. iStat Lactic negative. CBC with leukocytosis at 14.4. CMP  With AKI Cr shows 1.48. BUN 22. CK unremarkable. UDS positive for opiates. UA with possible UTI source. Doubt septicemia or cause of AMS. Afebrile. No tachycardia. Given Rocephin for UTI. Urine Culture obtained. as well as 40 IV Lasix in ED. Ammonia negative. BNP 349. CT Head w/o contrast shows questionable hypodensity in central left cerebellum likely small vessel ischemia, Post op changes noted from craniectomy. Plan is to Admit to medicine due to AMS likely 2/2 urinary source with AKI.   Disposition/Plan:  Admit Pt acknowledges and agrees with plan  Supervising Physician Noemi Chapel, MD  Final diagnoses:  Altered mental status, unspecified altered mental status type  Urinary tract infection without hematuria, site unspecified  AKI (acute kidney injury) [facility])    New Prescriptions New Prescriptions   No medications on file     Shary Decamp, Hershal Coria 01/28/17 1242    Noemi Chapel, MD 01/30/17 [phone]

## 2017-01-28 NOTE — H&P (Addendum)
[facility] History and Physical  Frank Pat. URK:[id number] DOB: Sep 03, 1960 DOA: 01/28/2017  Referring physician:  PCP: Leonard Downing, MD   Chief Complaint: "When I woke up he wasn't himself."-Wife  HPI: [name]. is a 56 y.o. male  with past medical history of brain tumor, easy bruising, myalgia, gout, hypertension, kidney stones, history of heart attack, history of stroke, heart failure, coronary artery disease who presents emergency room with unresponsiveness. Patient's wife gives history. States she woke up this morning and tried to wake her spouse who did not wake up. She shook him vigorously. When he did not wake up she called EMS. She reports no recent medication changes. Patient did get a new medication added for his gout that he has not taken yet.  ED course: CT head negative for acute stroke. Patient given a breathing treatment after chest x-ray showed pulmonary edema. Patient given Lasix. Patient started on Rocephin for UTI. Hospitalist consulted for admission.   Review of Systems:  As per HPI otherwise 10 point review of systems negative.    Past Medical History:  Diagnosis Date  . Brain tumor ([place])    Takes Topamax so patient will not have headaches  . Bruises easily   . CAD (coronary artery disease)    Cath 09, 99% LAD  . Cardiomyopathy, ischemic    Ischemic  . CHF (congestive heart failure) ([place])   . Cyst near coccyx   . Cyst near tailbone   . Diabetes mellitus without complication (HCC)    borderline  . Edema of both legs    Takes Lasix  . Fibromyalgia   . Gout   . Headache(784.0)    with lights  . Hypertension    dr Percival Spanish  . Kidney stones   . Myocardial infarction ([place])   . Obesity   . Pneumonia    hx of  . Shortness of breath   . Stroke [facility])    Affected vision, walking, and facial drooping on right face   Past Surgical History:  Procedure Laterality Date  . 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 ANAL FISTULECTOMY    . CORONARY ARTERY BYPASS GRAFT     LIMA to LAD 2009  . CRANIOTOMY Right 11/20/2012   Procedure: CRANIOTOMY REPAIR DURAL/CENTRAL SPINAL FLUID LEAK;  Surgeon: [name], MD;  Location: [place] NEURO ORS;  Service: Neurosurgery;  Laterality: Right;  . EYE SURGERY     to coorect lid and double vision  . MEDIAN RECTUS REPAIR Right 02/16/2016   Procedure: RIGHT LATERAL RECTUS RESECTION; SUPERIOR RECTUS RESECTION RIGHT EYE; RIGHT SUPERIOR RECTUS RECESSION; LATERAL TARSAL STRIP RIGHT LOWER EYELID;  Surgeon: [name], MD;  Location: [place];  Service: Ophthalmology;  Laterality: Right;  . PLACEMENT OF LUMBAR DRAIN N/A 11/20/2012   Procedure: Attempted PLACEMENT OF LUMBAR DRAIN;  Surgeon: [name], MD;  Location: [place] NEURO ORS;  Service: Neurosurgery;  Laterality: N/A;  . RETROSIGMOID CRANIECTOMY FOR TUMOR RESECTION Right 10/11/2012   Procedure: RETROSIGMOID CRANIECTOMY FOR TUMOR RESECTION;  Surgeon: [name], MD;  Location: [place] NEURO ORS;  Service: Neurosurgery;  Laterality: Right;  Craniotomy for acoustic neuroma  . TRACHEOSTOMY TUBE PLACEMENT N/A 10/11/2012   Procedure: TRACHEOSTOMY;  Surgeon: Ascencion Dike, MD;  [facility];  Service: ENT;  Laterality: N/A;  . UMBILICAL HERNIA REPAIR  Social History:  reports that he has been smoking Cigarettes.  He has a 5.00 pack-year smoking history. He uses smokeless tobacco. He reports that he does not drink alcohol or use drugs.  Allergies  Allergen Reactions  . Morphine And Related Anaphylaxis    "makes me stop breathing"  . Ivp Dye [Iodinated Diagnostic Agents]     Passing out  . Other     Steroids makes hearts race    Family History  Problem Relation Age of Onset  . CAD Father   . Hypertension Mother   . Cancer Other        multiple relatives     Prior to Admission medications     Medication Sig Start Date End Date Taking? Authorizing Provider  artificial tears (LACRILUBE) OINT ophthalmic ointment Place 1 application into the right eye at bedtime.    Yes [provider]  atenolol (TENORMIN) 50 MG tablet Take 1 tablet (50 mg total) by mouth 2 (two) times daily. Note increased dose. 11/12/12  Yes Love, Ivan Anchors, PA-C  furosemide (LASIX) 40 MG tablet Take 1 tablet (40 mg total) by mouth daily. 02/23/16  Yes Kelvin Cellar, MD  HYDROcodone-acetaminophen (NORCO) 10-325 MG tablet TAKE 1 TABLET BY MOUTH EVERY 6 HOURS AS NEEDED FOR PAIN MUST LAST 30 DAYS 01/08/17  Yes Meredith Staggers, MD  nystatin (NYSTATIN) powder Apply 1 g topically 2 (two) times daily as needed. 90 day supply 03/08/16  Yes Bayard Hugger, NP  Skin Protectants, Misc. (EUCERIN) cream Apply 1 application topically 3 (three) times daily.   Yes [provider]  tiZANidine (ZANAFLEX) 4 MG tablet TAKE 1 TABLET THREE TIMES DAILY AS NEEDED FOR MUSCLE SPASM(S) 01/08/17  Yes Meredith Staggers, MD  tobramycin-dexamethasone [facility]) ophthalmic ointment Place 1 application into the right eye 2 (two) times daily at 10 am and 4 pm. 02/23/16  Yes Kelvin Cellar, MD  topiramate (TOPAMAX) 100 MG tablet Take 1 tablet (100 mg total) by mouth daily. 3 month rx 01/08/17  Yes Meredith Staggers, MD  albuterol (PROVENTIL HFA;VENTOLIN HFA) 108 (90 BASE) MCG/ACT inhaler Inhale 2 puffs into the lungs every 6 (six) hours as needed for wheezing or shortness of breath. 11/12/12   Love, Ivan Anchors, PA-C  antiseptic oral rinse (BIOTENE) LIQD 15 mLs by Mouth Rinse route as needed for dry mouth.     [provider]  benzocaine (ORAJEL) 10 % mucosal gel Use as directed 1 application in the mouth or throat 2 (two) times daily as needed for pain.    [provider]  carbamazepine (TEGRETOL) 200 MG tablet Take 1 tablet (200 mg total) by mouth 2 (two) times daily. Take once at night for 4 days then twice daily thereafter  01/08/17   Meredith Staggers, MD  colchicine 0.6 MG tablet Take 1 tablet (0.6 mg total) by mouth daily. Patient taking differently: Take 0.6 mg by mouth daily as needed.  11/12/12   Love, Ivan Anchors, PA-C  isosorbide mononitrate (IMDUR) 30 MG 24 hr tablet Take 30 mg by mouth 2 (two) times daily.    [provider]  sucralfate (CARAFATE) 1 g tablet Take 1 tablet (1 g total) by mouth 4 (four) times daily -  with meals and at bedtime. 02/23/16   Kelvin Cellar, MD   Physical Exam: Vitals:   01/28/17 1045 01/28/17 1115 01/28/17 1227 01/28/17 1230  BP: (!) 146/51 (!) 139/55  139/67  Pulse: (!) 55 (!) 54  (!) 53  Resp:  20  (!) 27  Temp:      TempSrc:      SpO2: 94% 92% 91% 95%  Weight:        Wt Readings from Last 3 Encounters:  01/28/17 (!) 157.4 kg (347 lb)  02/23/16 (!) 157.6 kg (347 lb 8 oz)  02/11/16 (!) 166.5 kg (367 lb 1.6 oz)    General:  Appears calm and comfortable, GCS 14, alert and oriented 2 not to time Eyes:  PERRL, EOMI, normal lids, iris ENT:  grossly normal hearing, lips & tongue Neck:  no LAD, masses or thyromegaly Cardiovascular:  RRR, no m/r/g. No LE edema.  Respiratory:  Moderate respiratory distress, tachypnea, use was sensory muscles, breathing very labored, decreased air movement. Abdomen:  soft, ntnd Skin:  no rash or induration seen on limited exam Musculoskeletal:  grossly normal tone BUE/BLE Psychiatric:  grossly normal mood and affect, speech fluent and appropriate Neurologic:  CN 2-12 grossly intact, moves all extremities in coordinated fashion.          Labs on Admission:  Basic Metabolic Panel:  Recent Labs Lab 01/28/17 1102  NA 144  K 3.6  CL 95*  CO2 39*  GLUCOSE 209*  BUN 22*  CREATININE 1.48*  CALCIUM 9.2   Liver Function Tests:  Recent Labs Lab 01/28/17 1102  AST 27  ALT 53  ALKPHOS 115  BILITOT 0.6  PROT 7.4  ALBUMIN 2.9*   No results for input(s): LIPASE, AMYLASE in the last 168 hours.  Recent Labs Lab  01/28/17 1102  AMMONIA 49*   CBC:  Recent Labs Lab 01/28/17 1102  WBC 14.4*  NEUTROABS 10.5*  HGB 13.6  HCT 47.1  MCV 110.8*  PLT 240   Cardiac Enzymes:  Recent Labs Lab 01/28/17 1102  CKTOTAL 22*  TROPONINI 0.07*    BNP (last 3 results)  Recent Labs  02/16/16 1912 01/28/17 1102  BNP 85.4 394.6*    ProBNP (last 3 results) No results for input(s): PROBNP in the last 8760 hours.   Creatinine clearance cannot be calculated (Unknown ideal weight.)  CBG:  Recent Labs Lab 01/28/17 1054  GLUCAP 190*    Radiological Exams on Admission: Ct Head Wo Contrast  Result Date: 01/28/2017 CLINICAL DATA:  56 year old male with altered mental status, last seen normal 1100 hours yesterday. EXAM: CT HEAD WITHOUT CONTRAST TECHNIQUE: Contiguous axial images were obtained from the base of the skull through the vertex without intravenous contrast. COMPARISON:  Head CT without contrast 11/19/2012 and earlier. 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 Small chronic right lateral posterior fossa extra-axial hygroma has decreased since 2014. Questionable mild hypodensity in the left central cerebellum which would be new since 2014. Supratentorial gray-white matter differentiation is stable and within normal limits. No midline shift, ventriculomegaly, intracranial hemorrhage or evidence of cortically based acute infarction. Vascular: Mild Calcified atherosclerosis at the skull base. Skull: Chronic right suboccipital craniectomy changes again noted. An associated postoperative pseudomeningocele has resolved. Stable visualized osseous structures. Sinuses/Orbits: Mild mucosal thickening or small fluid level in the left maxillary sinus. Other paranasal sinuses are stable in clear. Right tympanic cavity and mastoids have cleared since 2014, but there is a new left mastoid effusion. The left tympanic cavity remains clear. Other: No acute orbit or scalp soft  tissue findings. IMPRESSION: 1. Questionable hypodensity in the central left cerebellum, such as due to small vessel ischemia, which would be new since 2014, but may be artifact. 2. Expected evolution of postoperative  changes from 2014 right cerebellopontine angle resection. 3. Left mastoid effusion is new since 2014 and likely postinflammatory. Previously-seen right tympanic and mastoid effusion has resolved. Electronically Signed   By: Genevie Ann M.D.   On: 01/28/2017 11:59   Dg Chest Portable 1 View  Result Date: 01/28/2017 CLINICAL DATA:  Altered mental status. EXAM: PORTABLE CHEST 1 VIEW COMPARISON:  Single-view of the chest 02/21/2016 and 11/20/2012. FINDINGS: The patient is status post CABG. There is cardiomegaly and pulmonary edema. No consolidative process, pneumothorax or effusion is identified. No acute bony abnormality. IMPRESSION: Cardiomegaly and pulmonary edema. Electronically Signed   By: Inge Rise M.D.   On: 01/28/2017 11:11    EKG: Independently reviewed. NSR. No STEMI.  Assessment/Plan Principal Problem:   Acute encephalopathy Active Problems:   Irritation of right eye   Acute CHF (HCC)   SIRS (systemic inflammatory response syndrome) (HCC)   UTI (urinary tract infection)  Acute Encepghalopathy due to SIRS 2/2 UTI Patient hemodynamically stable Given rocephin emergency room, will continue Urine culture pending Blood cultures 2 pending Lactic acid normal, will trend  CHF Cont diuresis with lasix started in ED, for total of 3 doses CXR in AM RT consult I&O Continue imdur Prn bipap Will consider nitro drip  AKI Baseline Cr 1.12, Cr on admit 1.48 Checking magnesium and phosphorus Urine labs ordered to calculate fractional excretion of Urea Likely due to infection & low O2 Will get US renal  Elevated troponin Likely due to heart failure combined with demand from infection and hypoxia Will trend Echo showed  Hyperammonemia Follow-up ammonia in the  morning Lactulose for treatment RUQ U/S  Hypertension When necessary hydralazine 10 mg IV as needed for severe blood pressure Continue Tenormin  Gout No acute episode Order culture seen a necessary  Chronic headache Continue Tegretol, Topamax  Dry eye Continue lubrication  Code Status: FULL DVT Prophylaxis: lovenox Family Communication: wife at bedside Disposition Plan: Pending Improvement  Status: sdu, inpt  Elwin Mocha, MD Family Medicine [facility] [email or web address] Password [email or web address]

## 2017-01-28 NOTE — ED Notes (Signed)
Pt given water per EDP.

## 2017-01-28 NOTE — ED Notes (Signed)
Neurologist at bedside. 

## 2017-01-28 NOTE — ED Notes (Signed)
Patient transported to CT 

## 2017-01-28 NOTE — Progress Notes (Signed)
Multiples pages sent out to Drs Hobb @ 1801 NPH Ellis @ 1803, Adm Team @ [phone], and other  NPH @ 1808 about patient new onset rapid atrial fib rate greater than 100 for 30 mins.  [place], NPH returned call with orders.

## 2017-01-29 ENCOUNTER — Inpatient Hospital Stay (HOSPITAL_COMMUNITY): Payer: Medicare PPO

## 2017-01-29 ENCOUNTER — Other Ambulatory Visit (HOSPITAL_COMMUNITY): Payer: Medicare PPO

## 2017-01-29 DIAGNOSIS — N390 Urinary tract infection, site not specified: Secondary | ICD-10-CM

## 2017-01-29 DIAGNOSIS — I5031 Acute diastolic (congestive) heart failure: Secondary | ICD-10-CM

## 2017-01-29 DIAGNOSIS — E7220 Disorder of urea cycle metabolism, unspecified: Secondary | ICD-10-CM

## 2017-01-29 DIAGNOSIS — L8990 Pressure ulcer of unspecified site, unspecified stage: Secondary | ICD-10-CM | POA: Insufficient documentation

## 2017-01-29 DIAGNOSIS — G9340 Encephalopathy, unspecified: Secondary | ICD-10-CM

## 2017-01-29 DIAGNOSIS — J810 Acute pulmonary edema: Secondary | ICD-10-CM

## 2017-01-29 DIAGNOSIS — N3000 Acute cystitis without hematuria: Secondary | ICD-10-CM

## 2017-01-29 LAB — CBC WITH DIFFERENTIAL/PLATELET
BASOS ABS: 0.1 10*3/uL (ref 0.0–0.1)
BASOS PCT: 0 %
EOS ABS: 0.2 10*3/uL (ref 0.0–0.7)
Eosinophils Relative: 1 %
HCT: 45 % (ref 39.0–52.0)
HEMOGLOBIN: 13 g/dL (ref 13.0–17.0)
Lymphocytes Relative: 11 %
Lymphs Abs: 1.3 10*3/uL (ref 0.7–4.0)
MCH: 31.8 pg (ref 26.0–34.0)
MCHC: 28.9 g/dL — AB (ref 30.0–36.0)
MCV: 110 fL — ABNORMAL HIGH (ref 78.0–100.0)
MONOS PCT: 11 %
Monocytes Absolute: 1.3 10*3/uL — ABNORMAL HIGH (ref 0.1–1.0)
NEUTROS PCT: 77 %
Neutro Abs: 9.1 10*3/uL — ABNORMAL HIGH (ref 1.7–7.7)
Platelets: 236 10*3/uL (ref 150–400)
RBC: 4.09 MIL/uL — ABNORMAL LOW (ref 4.22–5.81)
RDW: 12.8 % (ref 11.5–15.5)
WBC: 12 10*3/uL — AB (ref 4.0–10.5)

## 2017-01-29 LAB — GLUCOSE, CAPILLARY
GLUCOSE-CAPILLARY: 185 mg/dL — AB (ref 65–99)
Glucose-Capillary: 160 mg/dL — ABNORMAL HIGH (ref 65–99)
Glucose-Capillary: 179 mg/dL — ABNORMAL HIGH (ref 65–99)
Glucose-Capillary: 211 mg/dL — ABNORMAL HIGH (ref 65–99)
Glucose-Capillary: 148 mg/dL — ABNORMAL HIGH (ref 65–99)
Glucose-Capillary: 197 mg/dL — ABNORMAL HIGH (ref 65–99)

## 2017-01-29 LAB — BASIC METABOLIC PANEL
ANION GAP: 9 (ref 5–15)
BUN: 23 mg/dL — ABNORMAL HIGH (ref 6–20)
CALCIUM: 9 mg/dL (ref 8.9–10.3)
CO2: 44 mmol/L — ABNORMAL HIGH (ref 22–32)
Chloride: 92 mmol/L — ABNORMAL LOW (ref 101–111)
Creatinine, Ser: 1.43 mg/dL — ABNORMAL HIGH (ref 0.61–1.24)
GFR, EST NON AFRICAN AMERICAN: 54 mL/min — AB (ref >60)
GLUCOSE: 206 mg/dL — AB (ref 65–99)
Potassium: 3.3 mmol/L — ABNORMAL LOW (ref 3.5–5.1)
Sodium: 145 mmol/L (ref 135–145)

## 2017-01-29 LAB — AMMONIA: AMMONIA: 60 umol/L — AB (ref 9–35)

## 2017-01-29 LAB — PHOSPHORUS: PHOSPHORUS: 1.9 mg/dL — AB (ref 2.5–4.6)

## 2017-01-29 LAB — BLOOD GAS, ARTERIAL
ACID-BASE EXCESS: 19.7 mmol/L — AB (ref 0.0–2.0)
Bicarbonate: 45 mmol/L — ABNORMAL HIGH (ref 20.0–28.0)
DRAWN BY: 365291
O2 Content: 4 L/min
O2 SAT: 95 %
PATIENT TEMPERATURE: 98.6
PCO2 ART: 62.3 mmHg — AB (ref 32.0–48.0)
pH, Arterial: 7.472 — ABNORMAL HIGH (ref 7.350–7.450)
pO2, Arterial: 67.3 mmHg — ABNORMAL LOW (ref 83.0–108.0)

## 2017-01-29 LAB — URINE CULTURE

## 2017-01-29 LAB — UREA NITROGEN, URINE: UREA NITROGEN UR: 281 mg/dL

## 2017-01-29 LAB — TROPONIN I: Troponin I: 0.03 ng/mL (ref <0.03)

## 2017-01-29 LAB — MAGNESIUM: Magnesium: 1.8 mg/dL (ref 1.7–2.4)

## 2017-01-29 LAB — HIV ANTIBODY (ROUTINE TESTING W REFLEX): HIV SCREEN 4TH GENERATION: NONREACTIVE

## 2017-01-29 MED ORDER — K PHOS MONO-SOD PHOS DI & MONO 155-852-130 MG PO TABS
500.0000 mg | ORAL_TABLET | Freq: Every day | ORAL | Status: DC
  Administered 2017-01-29 – 2017-02-01 (×4): 500 mg via ORAL
  Filled 2017-01-29 (×7): qty 2

## 2017-01-29 MED ORDER — HYDROCODONE-ACETAMINOPHEN 10-325 MG PO TABS
1.0000 | ORAL_TABLET | Freq: Four times a day (QID) | ORAL | Status: DC | PRN
  Administered 2017-01-29 – 2017-02-03 (×13): 1 via ORAL
  Filled 2017-01-29 (×15): qty 1

## 2017-01-29 MED ORDER — POTASSIUM CHLORIDE CRYS ER 20 MEQ PO TBCR
40.0000 meq | EXTENDED_RELEASE_TABLET | Freq: Two times a day (BID) | ORAL | Status: AC
  Administered 2017-01-29 (×2): 40 meq via ORAL
  Filled 2017-01-29 (×2): qty 2

## 2017-01-29 MED ORDER — FUROSEMIDE 10 MG/ML IJ SOLN
40.0000 mg | Freq: Two times a day (BID) | INTRAMUSCULAR | Status: DC
  Administered 2017-01-29 – 2017-01-31 (×4): 40 mg via INTRAVENOUS
  Filled 2017-01-29 (×4): qty 4

## 2017-01-29 NOTE — Progress Notes (Signed)
Inpatient Diabetes Program Recommendations  AACE/ADA: New Consensus Statement on Inpatient Glycemic Control (2015)  Target Ranges:  Prepandial:   less than 140 mg/dL      Peak postprandial:   less than 180 mg/dL (1-2 hours)      Critically ill patients:  140 - 180 mg/dL   Lab Results  Component Value Date   GLUCAP 185 (H) 01/29/2017   HGBA1C 5.6 02/22/2016    Review of Glycemic Control:  Results for THORIN, STARNER (MRN [id number]) as of 01/29/2017 12:23  Ref. Range 01/28/2017 10:54 01/28/2017 16:23 01/28/2017 20:43 01/29/2017 00:50 01/29/2017 03:14 01/29/2017 08:35  Glucose-Capillary Latest Ref Range: 65 - 99 mg/dL 190 (H) 205 (H) 190 (H) 160 (H) 179 (H) 185 (H)   Diabetes history: None Outpatient Diabetes medications: None  Inpatient Diabetes Program Recommendations:   Please consider checking A1C to determine pre-hospitalization glycemic control.  Also consider adding Novolog moderate correction tid with meals and HS.   Thanks, Adah Perl, RN, BC-ADM Inpatient Diabetes Coordinator Pager [phone] (8a-5p)

## 2017-01-29 NOTE — Evaluation (Signed)
Clinical/Bedside Swallow Evaluation Patient Details  Name: [name]. MRN: [id number] Date of Birth: 1960-12-09  Today's Date: 01/29/2017 Time: SLP Start Time (ACUTE ONLY): 1120 SLP Stop Time (ACUTE ONLY): 1130 SLP Time Calculation (min) (ACUTE ONLY): 10 min  Past Medical History:  Past Medical History:  Diagnosis Date  . Brain tumor ([place])    Takes Topamax so patient will not have headaches  . Bruises easily   . CAD (coronary artery disease)    Cath 09, 99% LAD  . Cardiomyopathy, ischemic    Ischemic  . CHF (congestive heart failure) ([place])   . Cyst near coccyx   . Cyst near tailbone   . Diabetes mellitus without complication (HCC)    borderline  . Edema of both legs    Takes Lasix  . Fibromyalgia   . Gout   . Headache(784.0)    with lights  . Hypertension    dr Percival Spanish  . Kidney stones   . Myocardial infarction ([place])   . Obesity   . Pneumonia    hx of  . Shortness of breath   . Stroke [facility])    Affected vision, walking, and facial drooping on right face   Past Surgical History:  Past Surgical History:  Procedure Laterality Date  . 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 ANAL FISTULECTOMY    . CORONARY ARTERY BYPASS GRAFT     LIMA to LAD 2009  . CRANIOTOMY Right 11/20/2012   Procedure: CRANIOTOMY REPAIR DURAL/CENTRAL SPINAL FLUID LEAK;  Surgeon: [name], MD;  Location: [place] NEURO ORS;  Service: Neurosurgery;  Laterality: Right;  . EYE SURGERY     to coorect lid and double vision  . MEDIAN RECTUS REPAIR Right 02/16/2016   Procedure: RIGHT LATERAL RECTUS RESECTION; SUPERIOR RECTUS RESECTION RIGHT EYE; RIGHT SUPERIOR RECTUS RECESSION; LATERAL TARSAL STRIP RIGHT LOWER EYELID;  Surgeon: [name], MD;  Location: [place];  Service: Ophthalmology;  Laterality: Right;  . PLACEMENT OF LUMBAR DRAIN N/A 11/20/2012   Procedure:  Attempted PLACEMENT OF LUMBAR DRAIN;  Surgeon: [name], MD;  Location: [place] NEURO ORS;  Service: Neurosurgery;  Laterality: N/A;  . RETROSIGMOID CRANIECTOMY FOR TUMOR RESECTION Right 10/11/2012   Procedure: RETROSIGMOID CRANIECTOMY FOR TUMOR RESECTION;  Surgeon: [name], MD;  Location: [place] NEURO ORS;  Service: Neurosurgery;  Laterality: Right;  Craniotomy for acoustic neuroma  . TRACHEOSTOMY TUBE PLACEMENT N/A 10/11/2012   Procedure: TRACHEOSTOMY;  Surgeon: Ascencion Dike, MD;  [facility];  Service: ENT;  Laterality: N/A;  . 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 Patient's wife gives history. States she woke up in the morning and tried to wake her spouse who did not wake up. Pt dx with acute metabolic encephalopathy due to UTI and sepsis. CT head negative for acute stroke. 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 He does admit to having some  occasional buccal residuals and anterior spillage, which was noted on assessment, but eats and drinks without restriction at baseline. No acute impairment or evidence of aspiration observed. SLP cautioned pt to observe basic aspiration precautions, but no SLP f/u or diet modification needed.  SLP Visit Diagnosis: Dysphagia, oral phase (R13.11)    Aspiration Risk  Mild aspiration risk    Diet Recommendation Regular;Thin liquid   Liquid Administration via: Cup;Straw Medication Administration: Whole meds with  liquid Supervision: Patient able to self feed Compensations: Lingual sweep for clearance of pocketing Postural Changes: Seated upright at 90 degrees    Other  Recommendations Oral Care Recommendations: Oral care BID   Follow up Recommendations None      Frequency and Duration            Prognosis        Swallow Study   General HPI: Frank Arellanois a 56 y.o.malewith past medical history of brain tumor (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 Patient's wife gives history. States she woke up in the morning and tried to wake her spouse who did not wake up. Pt dx with acute metabolic encephalopathy due to UTI and sepsis. CT head negative for acute stroke. chest x-ray showed pulmonary edema Type of Study: Bedside Swallow Evaluation Previous Swallow Assessment: see impression statement Diet Prior to this Study: Regular;Thin liquids Temperature Spikes Noted: No Respiratory Status: Nasal cannula History of Recent Intubation: No Behavior/Cognition: Alert;Cooperative;Pleasant mood Oral Cavity Assessment: Within Functional Limits Oral Care Completed by SLP: No Oral Cavity - Dentition: Edentulous Vision: Functional for self-feeding Self-Feeding Abilities: Able to feed self Patient Positioning: Partially reclined Baseline Vocal Quality: Hoarse Volitional Cough: Strong Volitional Swallow: Able to elicit    Oral/Motor/Sensory Function Overall Oral Motor/Sensory Function: Moderate impairment Facial ROM: Reduced right;Suspected CN VII (facial) dysfunction Facial Symmetry: Abnormal symmetry right;Suspected CN VII (facial) dysfunction Facial Strength: Reduced right;Suspected CN VII (facial) dysfunction Lingual ROM:  Within Functional Limits Lingual Symmetry: Within Functional Limits Lingual Strength: Within Functional Limits Lingual Sensation: Within Functional Limits Velum: Within Functional Limits Mandible: Within Functional Limits   Ice Chips Ice chips: Not tested   Thin Liquid Thin Liquid: Within functional limits Presentation: Straw;Self Fed    Nectar Thick Nectar Thick Liquid: Not tested   Honey Thick Honey Thick Liquid: Not tested   Puree Puree: Within functional limits Presentation: Self Fed;Spoon   Solid   GO   Solid: Within functional limits Presentation: Self Fed Other Comments: needs liquids to aid in mastication       Herbie Baltimore, MA CCC-SLP [phone]  Lynann Beaver 01/29/2017,11:41 AM

## 2017-01-29 NOTE — Progress Notes (Signed)
PROGRESS NOTE  [name].  YCX:[id number] DOB: 02/10/1961 DOA: 01/28/2017 PCP: Leonard Downing, MD Outpatient Specialists:  Subjective: Patient is on BiPAP, asked for food and water, he later asked the nurse for his pain medications. Fever of 99.8 last night.  Brief Narrative:  Frank Marty. is a 56 y.o. male  with past medical history of brain tumor, easy bruising, myalgia, gout, hypertension, kidney stones, history of heart attack, history of stroke, heart failure, coronary artery disease who presents emergency room with unresponsiveness. Patient's wife gives history. States she woke up this morning and tried to wake her spouse who did not wake up. She shook him vigorously. When he did not wake up she called EMS. She reports no recent medication changes. Patient did get a new medication added for his gout that he has not taken yet.  Assessment & Plan:   Principal Problem:   Acute encephalopathy Active Problems:   Irritation of right eye   Acute CHF (HCC)   SIRS (systemic inflammatory response syndrome) (HCC)   UTI (urinary tract infection)   Pressure injury of skin   Acute metabolic encephalopathy due to UTI/sepsis Patient hemodynamically stable Given rocephin emergency room, will continue Urine culture pending Blood cultures 2 pending Previous ABG showed chronic hypercapnia likely contributing to his encephalopathy. This is improving.  Acute on chronic diastolic CHF CXR showed bibasilar opacities consistent with moderate edema. Started on aggressive diuresis with Lasix. Continue BiPAP as needed (history of intubation in June 2017)  New-onset atrial fibrillation Developed atrial fibrillation with rapid ventricular response since yesterday, currently on Cardizem drip. We'll ask cardiology to evaluate  AKI Baseline Cr 1.12 from 7/17, Cr on admit 1.48 Checking magnesium and phosphorus Cannot rule out cardiorenal syndrome, continue diuresis follow  renal function and intake/output closely.  Chronic hypercapnic respiratory failure Previous ABG showed hypercapnia with PCO2 baseline around 65, repeated today.  Elevated troponin Likely due to CHF and A. fib combined with demand from infection and hypoxia Trend showed flat pattern does not consistent with ACS pattern. Echo showed  Hyperammonemia Follow-up ammonia in the morning Got 1 dose of lactulose and RUQ ultrasound pending  Hypertension When necessary hydralazine 10 mg IV as needed for severe blood pressure Continue Tenormin  Gout No acute episode Order culture seen a necessary  Chronic headache Continue Tegretol, Topamax  Dry eye Continue lubrication   DVT prophylaxis:  Code Status: Full Code Family Communication:  Disposition Plan:  Diet: Diet NPO time specified  Consultants:   None  Procedures:   None  Antimicrobials:   Rocephin  Objective: Vitals:   01/29/17 0635 01/29/17 0800 01/29/17 0839 01/29/17 1000  BP: 110/71 (!) 124/54 (!) 129/118   Pulse: 86 (!) 114    Resp:  16    Temp:   98.6 F (37 C)   TempSrc:   Axillary   SpO2: 98% 93%  93%  Weight:      Height:        Intake/Output Summary (Last 24 hours) at 01/29/17 1038 Last data filed at 01/29/17 1000  Gross per 24 hour  Intake           253.62 ml  Output             3360 ml  Net         -3106.38 ml   Filed Weights   01/28/17 1042 01/28/17 1612 01/29/17 0451  Weight: (!) 157.4 kg (347 lb) (!) 173.6 kg (382 lb 11.5 oz) Marland Kitchen)  175 kg (385 lb 12.9 oz)    Examination: General exam: Appears calm and comfortable  Respiratory system: Clear to auscultation. Respiratory effort normal. Cardiovascular system: S1 & S2 heard, RRR. No JVD, murmurs, rubs, gallops or clicks. No pedal edema. Gastrointestinal system: Abdomen is nondistended, soft and nontender. No organomegaly or masses felt. Normal bowel sounds heard. Central nervous system: Alert and oriented. No focal neurological  deficits. Extremities: Symmetric 5 x 5 power. Skin: No rashes, lesions or ulcers Psychiatry: Judgement and insight appear normal. Mood & affect appropriate.   Data Reviewed: I have personally reviewed following labs and imaging studies  CBC:  Recent Labs Lab 01/28/17 1102 01/28/17 1646 01/29/17 0605  WBC 14.4* 12.4* 12.0*  NEUTROABS 10.5*  --  9.1*  HGB 13.6 12.7* 13.0  HCT 47.1 45.0 45.0  MCV 110.8* 111.7* 110.0*  PLT 240 240 287   Basic Metabolic Panel:  Recent Labs Lab 01/28/17 1102 01/28/17 1646 01/28/17 1900 01/29/17 0605  NA 144  --  141 145  K 3.6  --  3.5 3.3*  CL 95*  --  89* 92*  CO2 39*  --  40* 44*  GLUCOSE 209*  --  188* 206*  BUN 22*  --  22* 23*  CREATININE 1.48* 1.45* 1.40* 1.43*  CALCIUM 9.2  --  8.7* 9.0  MG  --  1.9  --  1.8  PHOS  --  3.9  --  1.9*   GFR: Estimated Creatinine Clearance: 95.1 mL/min (A) (by C-G formula based on SCr of 1.43 mg/dL (H)). Liver Function Tests:  Recent Labs Lab 01/28/17 1102  AST 27  ALT 53  ALKPHOS 115  BILITOT 0.6  PROT 7.4  ALBUMIN 2.9*   No results for input(s): LIPASE, AMYLASE in the last 168 hours.  Recent Labs Lab 01/28/17 1102 01/29/17 0605  AMMONIA 49* 60*   Coagulation Profile:  Recent Labs Lab 01/28/17 1102  INR 1.14   Cardiac Enzymes:  Recent Labs Lab 01/28/17 1102 01/28/17 1646 01/28/17 2259 01/29/17 0605  CKTOTAL 22*  --   --   --   TROPONINI 0.07* 0.03* 0.04* 0.03*   BNP (last 3 results) No results for input(s): PROBNP in the last 8760 hours. HbA1C: No results for input(s): HGBA1C in the last 72 hours. CBG:  Recent Labs Lab 01/28/17 1623 01/28/17 2043 01/29/17 0050 01/29/17 0314 01/29/17 0835  GLUCAP 205* 190* 160* 179* 185*   Lipid Profile: No results for input(s): CHOL, HDL, LDLCALC, TRIG, CHOLHDL, LDLDIRECT in the last 72 hours. Thyroid Function Tests: No results for input(s): TSH, T4TOTAL, FREET4, T3FREE, THYROIDAB in the last 72 hours. Anemia  Panel: No results for input(s): VITAMINB12, FOLATE, FERRITIN, TIBC, IRON, RETICCTPCT in the last 72 hours. Urine analysis:    Component Value Date/Time   COLORURINE YELLOW 01/28/2017 1108   APPEARANCEUR CLOUDY (A) 01/28/2017 1108   LABSPEC 1.009 01/28/2017 1108   PHURINE 6.0 01/28/2017 1108   GLUCOSEU NEGATIVE 01/28/2017 1108   HGBUR MODERATE (A) 01/28/2017 1108   BILIRUBINUR NEGATIVE 01/28/2017 1108   KETONESUR NEGATIVE 01/28/2017 1108   PROTEINUR 100 (A) 01/28/2017 1108   UROBILINOGEN 0.2 11/19/2012 2217   NITRITE NEGATIVE 01/28/2017 1108   LEUKOCYTESUR LARGE (A) 01/28/2017 1108   Sepsis Labs: @LABRCNTIP (procalcitonin:4,lacticidven:4)  ) Recent Results (from the past 240 hour(s))  MRSA PCR Screening     Status: None   Collection Time: 01/28/17  5:57 PM  Result Value Ref Range Status   MRSA by PCR NEGATIVE NEGATIVE Final  Comment:        The GeneXpert MRSA Assay (FDA approved for NASAL specimens only), is one component of a comprehensive MRSA colonization surveillance program. It is not intended to diagnose MRSA infection nor to guide or monitor treatment for MRSA infections.      Invalid input(s): PROCALCITONIN, LACTICACIDVEN   Radiology Studies: Ct Head Wo Contrast  Result Date: 01/28/2017 CLINICAL DATA:  56 year old male with altered mental status, last seen normal 1100 hours yesterday. EXAM: CT HEAD WITHOUT CONTRAST TECHNIQUE: Contiguous axial images were obtained from the base of the skull through the vertex without intravenous contrast. COMPARISON:  Head CT without contrast 11/19/2012 and earlier. 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 Small chronic right lateral posterior fossa extra-axial hygroma has decreased since 2014. Questionable mild hypodensity in the left central cerebellum which would be new since 2014. Supratentorial gray-white matter differentiation is stable and within normal limits. No midline shift,  ventriculomegaly, intracranial hemorrhage or evidence of cortically based acute infarction. Vascular: Mild Calcified atherosclerosis at the skull base. Skull: Chronic right suboccipital craniectomy changes again noted. An associated postoperative pseudomeningocele has resolved. Stable visualized osseous structures. Sinuses/Orbits: Mild mucosal thickening or small fluid level in the left maxillary sinus. Other paranasal sinuses are stable in clear. Right tympanic cavity and mastoids have cleared since 2014, but there is a new left mastoid effusion. The left tympanic cavity remains clear. Other: No acute orbit or scalp soft tissue findings. IMPRESSION: 1. Questionable hypodensity in the central left cerebellum, such as due to small vessel ischemia, which would be new since 2014, but may be artifact. 2. Expected evolution of postoperative changes from 2014 right cerebellopontine angle resection. 3. Left mastoid effusion is new since 2014 and likely postinflammatory. Previously-seen right tympanic and mastoid effusion has resolved. Electronically Signed   By: Genevie Ann M.D.   On: 01/28/2017 11:59   US Renal  Result Date: 01/29/2017 CLINICAL DATA:  Acute onset of renal insufficiency. Initial encounter. EXAM: RENAL / URINARY TRACT ULTRASOUND COMPLETE COMPARISON:  None. FINDINGS: Right Kidney: Length: 11.3 cm. Echogenicity within normal limits. No mass or hydronephrosis visualized. Left Kidney: Length: 13.5 cm. Echogenicity within normal limits. No mass or hydronephrosis visualized. Bladder: Decompressed and not well characterized. IMPRESSION: Evaluation is somewhat suboptimal due to overlying structures. No evidence of hydronephrosis. Unremarkable renal ultrasound. Electronically Signed   By: Garald Balding M.D.   On: 01/29/2017 00:29   Dg Chest Port 1 View  Result Date: 01/29/2017 CLINICAL DATA:  56 year old male with shortness of breath. Subsequent encounter. EXAM: PORTABLE CHEST 1 VIEW COMPARISON:  01/28/2017.  FINDINGS: Post CABG.  Cardiomegaly.  Pulmonary edema. Limited for evaluating mediastinal structures or detecting underlying mass. No pneumothorax. IMPRESSION: Similar appearance of cardiomegaly and pulmonary edema. Electronically Signed   By: Genia Del M.D.   On: 01/29/2017 07:02   Dg Chest Portable 1 View  Result Date: 01/28/2017 CLINICAL DATA:  Altered mental status. EXAM: PORTABLE CHEST 1 VIEW COMPARISON:  Single-view of the chest 02/21/2016 and 11/20/2012. FINDINGS: The patient is status post CABG. There is cardiomegaly and pulmonary edema. No consolidative process, pneumothorax or effusion is identified. No acute bony abnormality. IMPRESSION: Cardiomegaly and pulmonary edema. Electronically Signed   By: Inge Rise M.D.   On: 01/28/2017 11:11   US Abdomen Limited Ruq  Result Date: 01/29/2017 CLINICAL DATA:  Acute onset of hyperammonemia.  Initial encounter. EXAM: ULTRASOUND ABDOMEN LIMITED RIGHT UPPER QUADRANT COMPARISON:  None. FINDINGS: Gallbladder: No gallstones or wall thickening visualized. Contracted  and difficult to fully characterize. No sonographic Murphy sign noted by sonographer. Common bile duct: Diameter: 0.4 cm, within normal limits in caliber. Liver: No focal lesion identified. Increased parenchymal echogenicity and coarsened echotexture likely reflects fatty infiltration. IMPRESSION: 1. No acute abnormality seen at the right upper quadrant. 2. Gallbladder contracted and difficult to fully characterize, though grossly unremarkable in appearance. 3. Diffuse fatty infiltration within the liver. Electronically Signed   By: Garald Balding M.D.   On: 01/29/2017 00:30        Scheduled Meds: . artificial tears  1 application Right Eye QHS  . atenolol  50 mg Oral BID  . carbamazepine  200 mg Oral QHS   And  . [START ON 02/01/2017] carbamazepine  200 mg Oral BID  . enoxaparin (LOVENOX) injection  40 mg Subcutaneous Q24H  . isosorbide mononitrate  30 mg Oral BID  . lactulose   30 g Oral Once  . sodium chloride flush  3 mL Intravenous Q12H  . topiramate  100 mg Oral Daily   Continuous Infusions: . sodium chloride    . cefTRIAXone (ROCEPHIN)  IV    . diltiazem (CARDIZEM) infusion 7.5 mg/hr (01/29/17 0920)     LOS: 1 day    Time spent: 35 minutes    Sharnise Blough A, MD [facility] Pager [phone]  If 7PM-7AM, please contact night-coverage [email or web address] Password [facility] 01/29/2017, 10:38 AM

## 2017-01-29 NOTE — Care Management Note (Addendum)
Case Management Note  Patient Details  Name: [name]. MRN: [id number] Date of Birth: 02-18-1961  Subjective/Objective:  From home with wife and mom, he states he has home oxygen with [employer] (3 liters), rolling walker.  He states he has a Therapist, sports with [employer] that he states he can call anytime.  He also states he has [place] services in the past and it did not work out.    6/12 [place] RN BSN - NCM spoke with patient and wife at the bedside, NCM offered choice for HHPT and HHRN, patient states he does not wan HH services because they came out to his house before and did not do anything and they charged him a bill.  He said " No, No, I dont want it ." He is refusing [place] services at this time.   PCP  Claris Gower                    Action/Plan: NCM will follow for dc needs.  Expected Discharge Date:  02/01/17               Expected Discharge Plan:     In-House Referral:     Discharge planning Services  CM Consult  Post Acute Care Choice:    Choice offered to:     DME Arranged:    DME Agency:     HH Arranged:    HH Agency:     Status of Service:  In process, will continue to follow  If discussed at Long Length of Stay Meetings, dates discussed:    Additional Comments:  Zenon Mayo, RN 01/29/2017, 12:11 PM

## 2017-01-29 NOTE — Consult Note (Signed)
Cardiology Consultation:   Patient ID: Frank Berns.; [id number]; 1961-06-28   Admit date: 01/28/2017 Date of Consult: 01/29/2017  Primary Care Provider: Leonard Downing, MD Primary Cardiologist: Dr. Warren Lacy (2014) saw Dr. Gwenlyn Found (2017) because of timing issues. Primary Electrophysiologist:  None   Patient Profile:   Frank Pat. is a 56 y.o.morbidly obese male with a complicated PMH. He has hx of CAD s/p bypass graft of LIMA to his LAD by Dr. Nils Pyle in 2009. Normal nuc stress test 2017 (EF 45-54%).  Transthoracic echo 12/2015  (EF 55-50%) no regional wall motion abnormality or significant valvular dysfunction. He has facial palsy (R) with prior stroke and significant cranial surgery for brain tumor in 2014.  Hx of smoking on 3 liters O2 at home. Walks with a rolling walker, denies having falls. Hypertension, combined systolic and diastiolic CHF, diabetes, ischemic cardiomyopathy  who is being seen today for the evaluation of atrial fibrillation (new onset) at the request of Dr. Hartford Poli.  History of Present Illness:   Frank Pat.  Is a complicated morbidly obese patient. The clinical picture is complicated by his hx of stroke, brain tumor/resection and acute encephalopathy because he is having difficulty remembering details about his history of present illness and his past medical history.   He arrived to the ED by EMS last night after his wife was unable to wake him up. He has chronic motor neurological changes right sided weakness and facial palsy. He met SIRS criteria due to UTI and acute exacerbation of his combined systolic and diastolic heart failure.Yesterday around 8:30 pm the patient developed atrial fibrillation with RVR greater than 100 for 30 minutes with Cardizem drip. No EKG in system/seen on telemetry.  He is currently rate controlled but still in atrial fibrillation.  He endorses that he has a history of abnormal heart beat (not sure if this is  true because I do not see hx of it in his chart). He also reports not feeling well over the past week and thinking "he may have had his abnormal heart beat for the past week or month". He says that he can feel something in his chest but has had no chest pain since his bypass surgery.  He denies knowing how he got to the hospital. He currently says he feels fine.   He has been given 3+ doses of lasix  and is down 5.256 liters since admission. Repeat chest xray this AM unchanged. Chest xray 6/10, cardiomegaly with pulmonary edema. His creatinine baseline is 1.12 and is 1.48 on admission 1.43 today; renal US unremarkable. His troponin elevated 0.03<< 0.07 >> 0.03 << 0.04 >> 0.03.  Echocardiogram pending this admission.  BP meds: Atenolol 50 mg  BID, Imdur 30 mg BID, and 10 mg IV Hydralazine PRN sbp > 180. BP today has been well controlled.     Past Medical History:  Diagnosis Date  . Brain tumor ([place])    Takes Topamax so patient will not have headaches  . Bruises easily   . CAD (coronary artery disease)    Cath 09, 99% LAD  . Cardiomyopathy, ischemic    Ischemic  . CHF (congestive heart failure) ([place])   . Cyst near coccyx   . Cyst near tailbone   . Diabetes mellitus without complication (H[facility])    borderline  . Edema of both legs    Takes Lasix  . Fibromyalgia   . Gout   . Headache(784.0)    with  lights  . Hypertension    dr Percival Spanish  . Kidney stones   . Myocardial infarction ([place])   . Obesity   . Pneumonia    hx of  . Shortness of breath   . Stroke [facility])    Affected vision, walking, and facial drooping on right face    Past Surgical History:  Procedure Laterality Date  . 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 ANAL FISTULECTOMY    . CORONARY ARTERY BYPASS GRAFT     LIMA to LAD 2009  . CRANIOTOMY Right 11/20/2012   Procedure: CRANIOTOMY REPAIR  DURAL/CENTRAL SPINAL FLUID LEAK;  Surgeon: [name], MD;  Location: [place] NEURO ORS;  Service: Neurosurgery;  Laterality: Right;  . EYE SURGERY     to coorect lid and double vision  . MEDIAN RECTUS REPAIR Right 02/16/2016   Procedure: RIGHT LATERAL RECTUS RESECTION; SUPERIOR RECTUS RESECTION RIGHT EYE; RIGHT SUPERIOR RECTUS RECESSION; LATERAL TARSAL STRIP RIGHT LOWER EYELID;  Surgeon: [name], MD;  Location: [place];  Service: Ophthalmology;  Laterality: Right;  . PLACEMENT OF LUMBAR DRAIN N/A 11/20/2012   Procedure: Attempted PLACEMENT OF LUMBAR DRAIN;  Surgeon: [name], MD;  Location: [place] NEURO ORS;  Service: Neurosurgery;  Laterality: N/A;  . RETROSIGMOID CRANIECTOMY FOR TUMOR RESECTION Right 10/11/2012   Procedure: RETROSIGMOID CRANIECTOMY FOR TUMOR RESECTION;  Surgeon: [name], MD;  Location: [place] NEURO ORS;  Service: Neurosurgery;  Laterality: Right;  Craniotomy for acoustic neuroma  . TRACHEOSTOMY TUBE PLACEMENT N/A 10/11/2012   Procedure: TRACHEOSTOMY;  Surgeon: Ascencion Dike, MD;  Location: [facility] NEURO [facility]S;  Service: ENT;  Laterality: N/A;  . UMBILICAL HERNIA REPAIR       Inpatient Medications: Scheduled Meds: . artificial tears  1 application Right Eye QHS  . atenolol  50 mg Oral BID  . carbamazepine  200 mg Oral QHS   And  . [START ON 02/01/2017] carbamazepine  200 mg Oral BID  . enoxaparin (LOVENOX) injection  40 mg Subcutaneous Q24H  . isosorbide mononitrate  30 mg Oral BID  . lactulose  30 g Oral Once  . phosphorus  500 mg Oral Daily  . potassium chloride  40 mEq Oral BID  . sodium chloride flush  3 mL Intravenous Q12H  . topiramate  100 mg Oral Daily   Continuous Infusions: . sodium chloride    . cefTRIAXone (ROCEPHIN)  IV Stopped (01/29/17 1235)  . diltiazem (CARDIZEM) infusion 7.5 mg/hr (01/29/17 0920)   PRN Meds: sodium chloride, acetaminophen, albuterol, hydrALAZINE, HYDROcodone-acetaminophen, ondansetron (ZOFRAN) IV, sodium chloride flush  Allergies:     Allergies  Allergen Reactions  . Morphine And Related Anaphylaxis    "makes me stop breathing"  . Ivp Dye [Iodinated Diagnostic Agents]     Passing out  . Other     Steroids makes hearts race    Social History:   Social History   Social History  . Marital status: Married    Spouse name: N/A  . Number of children: N/A  . Years of education: N/A   Occupational History  . Not on file.   Social History Main Topics  . Smoking status: Light Tobacco Smoker    Packs/day: 0.25    Years: 20.00    Types: Cigarettes  . Smokeless tobacco: Current User     Comment: Rare tobacco.  "Puff or two  a day".  . Alcohol use No  . Drug use: No  . Sexual activity: Yes   Other Topics Concern  . Not on file   Social History Narrative  . No narrative on file    Family History:   The patient's family history includes CAD in his father; Cancer in his other; Hypertension in his mother.  ROS:  Please see the history of present illness.  All other ROS reviewed and negative.     Physical Exam/Data:   Vitals:   01/29/17 0839 01/29/17 1000 01/29/17 1100 01/29/17 1203  BP: (!) 129/118  138/60 114/79  Pulse:   82 77  Resp:    (!) 25  Temp: 98.6 F (37 C)   99.1 F (37.3 C)  TempSrc: Axillary   Oral  SpO2:  93%  91%  Weight:      Height:        Intake/Output Summary (Last 24 hours) at 01/29/17 1433 Last data filed at 01/29/17 1302  Gross per 24 hour  Intake           253.62 ml  Output             4725 ml  Net         -4471.38 ml   Filed Weights   01/28/17 1042 01/28/17 1612 01/29/17 0451  Weight: (!) 347 lb (157.4 kg) (!) 382 lb 11.5 oz (173.6 kg) (!) 385 lb 12.9 oz (175 kg)   Body mass index is 53.81 kg/m.  General: Well developed, well nourished, in no acute distress. + morbidly obese Head: Normocephalic, atraumatic, sclera non-icteric, no xanthomas, nares are without discharge.  Neck: Negative for carotid bruits.  Difficult to assess for JVD due to body habitus. Lungs:  Clear bilaterally to auscultation without wheezes, rales, or rhonchi. Breathing is unlabored. Heart:  Irregular rhythm, regular rate. with S1 S2. No murmurs, rubs, or gallops appreciated. Abdomen: Soft, non-tender, non-distended with normoactive bowel sounds. No hepatomegaly. No rebound/guarding. No obvious abdominal masses. Msk:  Strength and tone appear normal for age. Extremities: No clubbing or cyanosis. No edema.  Distal pedal pulses are 2+ and equal bilaterally. Neuro: <oving all 4 extremities, chronic weakness. Pt somewhat confused. Psych:  Responds to questions appropriately with a normal affect.  EKG:  The EKG was personally reviewed and demonstrates HR 60, sinus rhythm.   Relevant CV Studies: Echo pending this admission.  Laboratory Data:  Chemistry Recent Labs Lab 01/28/17 1102 01/28/17 1646 01/28/17 1900 01/29/17 0605  NA 144  --  141 145  K 3.6  --  3.5 3.3*  CL 95*  --  89* 92*  CO2 39*  --  40* 44*  GLUCOSE 209*  --  188* 206*  BUN 22*  --  22* 23*  CREATININE 1.48* 1.45* 1.40* 1.43*  CALCIUM 9.2  --  8.7* 9.0  GFRNONAA 52* 53* 55* 54*  GFRAA 60* >60 >60 >60  ANIONGAP 10  --  12 9     Recent Labs Lab 01/28/17 1102  PROT 7.4  ALBUMIN 2.9*  AST 27  ALT 53  ALKPHOS 115  BILITOT 0.6   Hematology Recent Labs Lab 01/28/17 1102 01/28/17 1646 01/29/17 0605  WBC 14.4* 12.4* 12.0*  RBC 4.25 4.03* 4.09*  HGB 13.6 12.7* 13.0  HCT 47.1 45.0 45.0  MCV 110.8* 111.7* 110.0*  MCH 32.0 31.5 31.8  MCHC 28.9* 28.2* 28.9*  RDW 13.0 13.0 12.8  PLT 240 240 236   Cardiac Enzymes Recent Labs  Lab 01/28/17 1102 01/28/17 1646 01/28/17 2259 01/29/17 0605  TROPONINI 0.07* 0.03* 0.04* 0.03*   No results for input(s): TROPIPOC in the last 168 hours.  BNP Recent Labs Lab 01/28/17 1102  BNP 394.6*    DDimer No results for input(s): DDIMER in the last 168 hours.  Radiology/Studies:  Ct Head Wo Contrast  Result Date: 01/28/2017 CLINICAL DATA:  56 year old  male with altered mental status, last seen normal 1100 hours yesterday. EXAM: CT HEAD WITHOUT CONTRAST TECHNIQUE: Contiguous axial images were obtained from the base of the skull through the vertex without intravenous contrast. COMPARISON:  Head CT without contrast 11/19/2012 and earlier. 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 Small chronic right lateral posterior fossa extra-axial hygroma has decreased since 2014. Questionable mild hypodensity in the left central cerebellum which would be new since 2014. Supratentorial gray-white matter differentiation is stable and within normal limits. No midline shift, ventriculomegaly, intracranial hemorrhage or evidence of cortically based acute infarction. Vascular: Mild Calcified atherosclerosis at the skull base. Skull: Chronic right suboccipital craniectomy changes again noted. An associated postoperative pseudomeningocele has resolved. Stable visualized osseous structures. Sinuses/Orbits: Mild mucosal thickening or small fluid level in the left maxillary sinus. Other paranasal sinuses are stable in clear. Right tympanic cavity and mastoids have cleared since 2014, but there is a new left mastoid effusion. The left tympanic cavity remains clear. Other: No acute orbit or scalp soft tissue findings. IMPRESSION: 1. Questionable hypodensity in the central left cerebellum, such as due to small vessel ischemia, which would be new since 2014, but may be artifact. 2. Expected evolution of postoperative changes from 2014 right cerebellopontine angle resection. 3. Left mastoid effusion is new since 2014 and likely postinflammatory. Previously-seen right tympanic and mastoid effusion has resolved. Electronically Signed   By: Genevie Ann M.D.   On: 01/28/2017 11:59   US Renal  Result Date: 01/29/2017 CLINICAL DATA:  Acute onset of renal insufficiency. Initial encounter. EXAM: RENAL / URINARY TRACT ULTRASOUND COMPLETE COMPARISON:  None.  FINDINGS: Right Kidney: Length: 11.3 cm. Echogenicity within normal limits. No mass or hydronephrosis visualized. Left Kidney: Length: 13.5 cm. Echogenicity within normal limits. No mass or hydronephrosis visualized. Bladder: Decompressed and not well characterized. IMPRESSION: Evaluation is somewhat suboptimal due to overlying structures. No evidence of hydronephrosis. Unremarkable renal ultrasound. Electronically Signed   By: Garald Balding M.D.   On: 01/29/2017 00:29   Dg Chest Port 1 View  Result Date: 01/29/2017 CLINICAL DATA:  56 year old male with shortness of breath. Subsequent encounter. EXAM: PORTABLE CHEST 1 VIEW COMPARISON:  01/28/2017. FINDINGS: Post CABG.  Cardiomegaly.  Pulmonary edema. Limited for evaluating mediastinal structures or detecting underlying mass. No pneumothorax. IMPRESSION: Similar appearance of cardiomegaly and pulmonary edema. Electronically Signed   By: Genia Del M.D.   On: 01/29/2017 07:02   Dg Chest Portable 1 View  Result Date: 01/28/2017 CLINICAL DATA:  Altered mental status. EXAM: PORTABLE CHEST 1 VIEW COMPARISON:  Single-view of the chest 02/21/2016 and 11/20/2012. FINDINGS: The patient is status post CABG. There is cardiomegaly and pulmonary edema. No consolidative process, pneumothorax or effusion is identified. No acute bony abnormality. IMPRESSION: Cardiomegaly and pulmonary edema. Electronically Signed   By: Inge Rise M.D.   On: 01/28/2017 11:11   US Abdomen Limited Ruq  Result Date: 01/29/2017 CLINICAL DATA:  Acute onset of hyperammonemia.  Initial encounter. EXAM: ULTRASOUND ABDOMEN LIMITED RIGHT UPPER QUADRANT COMPARISON:  None. FINDINGS: Gallbladder: No gallstones or wall thickening visualized. Contracted and difficult to fully  characterize. No sonographic Murphy sign noted by sonographer. Common bile duct: Diameter: 0.4 cm, within normal limits in caliber. Liver: No focal lesion identified. Increased parenchymal echogenicity and coarsened  echotexture likely reflects fatty infiltration. IMPRESSION: 1. No acute abnormality seen at the right upper quadrant. 2. Gallbladder contracted and difficult to fully characterize, though grossly unremarkable in appearance. 3. Diffuse fatty infiltration within the liver. Electronically Signed   By: Garald Balding M.D.   On: 01/29/2017 00:30    Assessment and Plan:   1. New onset paroxysmal atrial fibrillation with RVR: New onset. A fib likely triggered by systimic illness, weight and possibly OSA. On diltiazem  Still in atrial fibrillation but now rate controlled. CHADVASC is 6. He uses a walker but denies being a fall risk. Hx of brain tumors. Not on anticoagulation at home, currently on Lovenox sq, he is not on a therapeutic dose for afib. -- cont Cardizem gtt, pt still in afib, rate controlled.  -- CHADVASC of 6  -- Cardiology is in favor of anticoagulation but will defer to primary team. It is unclear what the patients risk is for fall or if he is a bleed risk (hx of brain tumor/surgery) -- therapeutic dose of Lovenox for atrial fib is 1 mg/kg daily, he is currently being given 40 mg sq. -- consider sleep study as outpatient if he has not been assessed already for sleep apnea.  2. Elevated Troponin: 0.03<< 0.07 >> 0.03 << 0.04 >> 0.03. Likely related to ischemic in the setting of sepsis and new onset a. Fib with CHF exacerbation. Normal nuc stress test 2017 (EF 45-54%). He has not been having any chest pain, do not feel like ischemic work-up needed at this time. -- echo pending.  3.Acute on chronic combined heart failure: chest xray shows edema and cardiomegaly, difficult t assess by physical exam.  -- Lasix 40 mg BID, will watch creatinine/BUN closely.  4. Hypertension:  Cont BP regimen, appears well controlled  5. AKI:  creatinine baseline is 1.12 and is 1.48 on admission 1.43 today  6. Sepsis/UTI: Medicine to manage.on IV abx, blood cultures pending.  Kristopher Glee, PA-C    01/29/2017 2:33 PM   Attending Note:   The patient was seen and examined.  Agree with assessment and plan as noted above.  Changes made to the above note as needed.  Patient seen and independently examined with Frank Haring, PA.   We discussed all aspects of the encounter. I agree with the assessment and plan as stated above.  1.   Atrial fib:   Likely related to his sepsis syndrome.   Has significant risk factors for CVA - CHADS2VASC is 6 ( CHF, CVA, HTN, DM, CAD)  He is somewhat unsteady on his feet but has not been falling. I would recommend full anticoagulation .    2.   Elevated Troponin:  Likely due to sepsis and rapid atrial fib.  Would not pursue any further cardiac  work up if his LV systolic function is normal.    3. Chronic diastolic CHF:   Is volume overloaded from the treatment of SIRS.   Has diuresed well.  No dyspnea     I have spent a total of 40 minutes with patient reviewing hospital  notes , telemetry, EKGs, labs and examining patient as well as establishing an assessment and plan that was discussed with the patient. > 50% of time was spent in direct patient care.    Ramond Dial., MD,  [facility] 01/29/2017, 3:45 PM [address]  [place] Pager [phone]

## 2017-01-30 ENCOUNTER — Inpatient Hospital Stay (HOSPITAL_COMMUNITY): Payer: Medicare PPO

## 2017-01-30 ENCOUNTER — Other Ambulatory Visit (HOSPITAL_COMMUNITY): Payer: Medicare PPO

## 2017-01-30 DIAGNOSIS — I360 Nonrheumatic tricuspid (valve) stenosis: Secondary | ICD-10-CM

## 2017-01-30 DIAGNOSIS — I509 Heart failure, unspecified: Secondary | ICD-10-CM

## 2017-01-30 DIAGNOSIS — I481 Persistent atrial fibrillation: Secondary | ICD-10-CM

## 2017-01-30 DIAGNOSIS — R6510 Systemic inflammatory response syndrome (SIRS) of non-infectious origin without acute organ dysfunction: Secondary | ICD-10-CM

## 2017-01-30 LAB — GLUCOSE, CAPILLARY
GLUCOSE-CAPILLARY: 159 mg/dL — AB (ref 65–99)
GLUCOSE-CAPILLARY: 172 mg/dL — AB (ref 65–99)
GLUCOSE-CAPILLARY: 177 mg/dL — AB (ref 65–99)
Glucose-Capillary: 220 mg/dL — ABNORMAL HIGH (ref 65–99)
Glucose-Capillary: 168 mg/dL — ABNORMAL HIGH (ref 65–99)
Glucose-Capillary: 195 mg/dL — ABNORMAL HIGH (ref 65–99)

## 2017-01-30 LAB — BASIC METABOLIC PANEL
Anion gap: 10 (ref 5–15)
BUN: 24 mg/dL — ABNORMAL HIGH (ref 6–20)
CALCIUM: 8.6 mg/dL — AB (ref 8.9–10.3)
CO2: 42 mmol/L — ABNORMAL HIGH (ref 22–32)
Chloride: 91 mmol/L — ABNORMAL LOW (ref 101–111)
Creatinine, Ser: 1.38 mg/dL — ABNORMAL HIGH (ref 0.61–1.24)
GFR calc Af Amer: >60 mL/min (ref >60)
GFR, EST NON AFRICAN AMERICAN: 56 mL/min — AB (ref >60)
Glucose, Bld: 168 mg/dL — ABNORMAL HIGH (ref 65–99)
Potassium: 3 mmol/L — ABNORMAL LOW (ref 3.5–5.1)
SODIUM: 143 mmol/L (ref 135–145)

## 2017-01-30 LAB — ECHOCARDIOGRAM COMPLETE
HEIGHTINCHES: 71 in
Weight: 6144.66 oz

## 2017-01-30 LAB — CBC
HCT: 44.1 % (ref 39.0–52.0)
Hemoglobin: 12.8 g/dL — ABNORMAL LOW (ref 13.0–17.0)
MCH: 31.4 pg (ref 26.0–34.0)
MCHC: 29 g/dL — ABNORMAL LOW (ref 30.0–36.0)
MCV: 108.1 fL — ABNORMAL HIGH (ref 78.0–100.0)
PLATELETS: 288 10*3/uL (ref 150–400)
RBC: 4.08 MIL/uL — AB (ref 4.22–5.81)
RDW: 12.8 % (ref 11.5–15.5)
WBC: 12.7 10*3/uL — AB (ref 4.0–10.5)

## 2017-01-30 LAB — MAGNESIUM: MAGNESIUM: 1.8 mg/dL (ref 1.7–2.4)

## 2017-01-30 MED ORDER — DILTIAZEM HCL ER COATED BEADS 120 MG PO CP24
120.0000 mg | ORAL_CAPSULE | Freq: Every day | ORAL | Status: DC
  Administered 2017-01-30 – 2017-02-03 (×5): 120 mg via ORAL
  Filled 2017-01-30 (×5): qty 1

## 2017-01-30 MED ORDER — APIXABAN 5 MG PO TABS
5.0000 mg | ORAL_TABLET | Freq: Two times a day (BID) | ORAL | Status: DC
  Administered 2017-01-30 – 2017-02-03 (×9): 5 mg via ORAL
  Filled 2017-01-30 (×9): qty 1

## 2017-01-30 NOTE — Progress Notes (Signed)
PROGRESS NOTE  [name].  JSH:[id number] DOB: Jan 20, 1961 DOA: 01/28/2017 PCP: Leonard Downing, MD Outpatient Specialists:  Subjective: On nasal cannula this morning, still has some shortness of breath and minimal cough. Denies fever, denies chills denies nausea or vomiting.  Brief Narrative:  Frank Karlen. is a 56 y.o. male  with past medical history of brain tumor, easy bruising, myalgia, gout, hypertension, kidney stones, history of heart attack, history of stroke, heart failure, coronary artery disease who presents emergency room with unresponsiveness. Patient's wife gives history. States she woke up this morning and tried to wake her spouse who did not wake up. She shook him vigorously. When he did not wake up she called EMS. She reports no recent medication changes. Patient did get a new medication added for his gout that he has not taken yet.  Assessment & Plan:   Principal Problem:   Acute encephalopathy Active Problems:   Irritation of right eye   Acute CHF (HCC)   SIRS (systemic inflammatory response syndrome) (HCC)   UTI (urinary tract infection)   Pressure injury of skin   Acute metabolic encephalopathy due to UTI/sepsis Patient hemodynamically stable Given rocephin emergency room, will continue Urine culture pending Blood cultures 2 pending ABG done and showed PCO2 of 62, this is around baseline, last ABG showed PCO2 of 65.6. Lethargy and somnolence improving.  Acute on chronic diastolic CHF CXR showed bibasilar opacities consistent with moderate edema. BiPAP on and off, continue aggressive diuresis with Lasix. Cardiology consulted  New-onset atrial fibrillation Developed atrial fibrillation with rapid ventricular response was on Cardizem drip. Switch to oral Cardizem, LVEF is 55-60% from echo on 01/07/2016 CHA2DS2-VAScis 6, was on Lovenox will switch to Eliquis (Discontinue Tegretol per pharmacy recommendation as its interact with  Eliquis).  AKI Baseline Cr 1.12 from 7/17, Cr on admit 1.48 Checking magnesium and phosphorus Cannot rule out cardiorenal syndrome, continue diuresis follow renal function and intake/output closely.  Chronic hypercapnic respiratory failure Previous ABG showed hypercapnia with PCO2 baseline around 65. BiPAP as needed.  Elevated troponin Likely due to CHF and A. fib combined with demand from infection and hypoxia Trend showed flat pattern does not consistent with ACS pattern. Echo showed  Hyperammonemia Follow-up ammonia in the morning Got 1 dose of lactulose and RUQ ultrasound pending  Hypertension When necessary hydralazine 10 mg IV as needed for severe blood pressure Continue Tenormin  Gout No acute episode Order culture seen a necessary  Chronic headache Continue Topamax, per previous record this is likely secondary to previous brain tumor resection. Patient has chronic hypercapnia which also can cause chronic headaches.  Peripheral neuropathy Started recently on 5/21 on Tegretol by Dr. Tessa Lerner, will discontinue Tegretol as this is interact with Eliquis.  Dry eye Continue lubrication  Morbid obesity and probable oh at bedtime Patient is likely pickwickian, on CPAP at night, BiPAP as needed. Counseled about weight loss.   DVT prophylaxis:  Code Status: Full Code Family Communication:  Disposition Plan:  Diet: Diet Heart Room service appropriate? Yes; Fluid consistency: Thin  Consultants:   None  Procedures:   None  Antimicrobials:   Rocephin  Objective: Vitals:   01/30/17 0900 01/30/17 1000 01/30/17 1100 01/30/17 1152  BP: 119/72 128/83 112/67 112/67  Pulse: 74 83 71 75  Resp: (!) 27 (!) 24 (!) 23 (!) 28  Temp:    98.3 F (36.8 C)  TempSrc:    Oral  SpO2: 96% 96% 94% 96%  Weight:  Height:        Intake/Output Summary (Last 24 hours) at 01/30/17 1348 Last data filed at 01/30/17 1243  Gross per 24 hour  Intake          1209.63 ml    Output             3650 ml  Net         -2440.37 ml   Filed Weights   01/28/17 1612 01/29/17 0451 01/30/17 0405  Weight: (!) 173.6 kg (382 lb 11.5 oz) (!) 175 kg (385 lb 12.9 oz) (!) 174.2 kg (384 lb 0.7 oz)    Examination: General exam: Appears calm and comfortable  Respiratory system: Clear to auscultation. Respiratory effort normal. Cardiovascular system: S1 & S2 heard, RRR. No JVD, murmurs, rubs, gallops or clicks. No pedal edema. Gastrointestinal system: Abdomen is nondistended, soft and nontender. No organomegaly or masses felt. Normal bowel sounds heard. Central nervous system: Alert and oriented. No focal neurological deficits. Extremities: Symmetric 5 x 5 power. Skin: No rashes, lesions or ulcers Psychiatry: Judgement and insight appear normal. Mood & affect appropriate.   Data Reviewed: I have personally reviewed following labs and imaging studies  CBC:  Recent Labs Lab 01/28/17 1102 01/28/17 1646 01/29/17 0605 01/30/17 0412  WBC 14.4* 12.4* 12.0* 12.7*  NEUTROABS 10.5*  --  9.1*  --   HGB 13.6 12.7* 13.0 12.8*  HCT 47.1 45.0 45.0 44.1  MCV 110.8* 111.7* 110.0* 108.1*  PLT 240 240 236 528   Basic Metabolic Panel:  Recent Labs Lab 01/28/17 1102 01/28/17 1646 01/28/17 1900 01/29/17 0605 01/30/17 0412  NA 144  --  141 145 143  K 3.6  --  3.5 3.3* 3.0*  CL 95*  --  89* 92* 91*  CO2 39*  --  40* 44* 42*  GLUCOSE 209*  --  188* 206* 168*  BUN 22*  --  22* 23* 24*  CREATININE 1.48* 1.45* 1.40* 1.43* 1.38*  CALCIUM 9.2  --  8.7* 9.0 8.6*  MG  --  1.9  --  1.8 1.8  PHOS  --  3.9  --  1.9*  --    GFR: Estimated Creatinine Clearance: 98.3 mL/min (A) (by C-G formula based on SCr of 1.38 mg/dL (H)). Liver Function Tests:  Recent Labs Lab 01/28/17 1102  AST 27  ALT 53  ALKPHOS 115  BILITOT 0.6  PROT 7.4  ALBUMIN 2.9*   No results for input(s): LIPASE, AMYLASE in the last 168 hours.  Recent Labs Lab 01/28/17 1102 01/29/17 0605  AMMONIA 49* 60*    Coagulation Profile:  Recent Labs Lab 01/28/17 1102  INR 1.14   Cardiac Enzymes:  Recent Labs Lab 01/28/17 1102 01/28/17 1646 01/28/17 2259 01/29/17 0605  CKTOTAL 22*  --   --   --   TROPONINI 0.07* 0.03* 0.04* 0.03*   BNP (last 3 results) No results for input(s): PROBNP in the last 8760 hours. HbA1C: No results for input(s): HGBA1C in the last 72 hours. CBG:  Recent Labs Lab 01/29/17 2054 01/30/17 0025 01/30/17 0401 01/30/17 0751 01/30/17 1150  GLUCAP 197* 177* 159* 172* 220*   Lipid Profile: No results for input(s): CHOL, HDL, LDLCALC, TRIG, CHOLHDL, LDLDIRECT in the last 72 hours. Thyroid Function Tests: No results for input(s): TSH, T4TOTAL, FREET4, T3FREE, THYROIDAB in the last 72 hours. Anemia Panel: No results for input(s): VITAMINB12, FOLATE, FERRITIN, TIBC, IRON, RETICCTPCT in the last 72 hours. Urine analysis:    Component Value Date/Time  COLORURINE YELLOW 01/28/2017 1108   APPEARANCEUR CLOUDY (A) 01/28/2017 1108   LABSPEC 1.009 01/28/2017 1108   PHURINE 6.0 01/28/2017 1108   GLUCOSEU NEGATIVE 01/28/2017 1108   HGBUR MODERATE (A) 01/28/2017 1108   BILIRUBINUR NEGATIVE 01/28/2017 1108   KETONESUR NEGATIVE 01/28/2017 1108   PROTEINUR 100 (A) 01/28/2017 1108   UROBILINOGEN 0.2 11/19/2012 2217   NITRITE NEGATIVE 01/28/2017 1108   LEUKOCYTESUR LARGE (A) 01/28/2017 1108   Sepsis Labs: @LABRCNTIP (procalcitonin:4,lacticidven:4)  ) Recent Results (from the past 240 hour(s))  Culture, blood (Routine x 2)     Status: None (Preliminary result)   Collection Time: 01/28/17 11:00 AM  Result Value Ref Range Status   Specimen Description BLOOD LEFT HAND  Final   Special Requests IN PEDIATRIC BOTTLE Blood Culture adequate volume  Final   Culture NO GROWTH 1 DAY  Final   Report Status PENDING  Incomplete  Culture, blood (Routine x 2)     Status: None (Preliminary result)   Collection Time: 01/28/17 11:07 AM  Result Value Ref Range Status   Specimen  Description BLOOD LEFT WRIST  Final   Special Requests IN PEDIATRIC BOTTLE Blood Culture adequate volume  Final   Culture NO GROWTH 1 DAY  Final   Report Status PENDING  Incomplete  Urine culture     Status: Abnormal   Collection Time: 01/28/17 12:36 PM  Result Value Ref Range Status   Specimen Description URINE, RANDOM  Final   Special Requests NONE  Final   Culture MULTIPLE SPECIES PRESENT, SUGGEST RECOLLECTION (A)  Final   Report Status 01/29/2017 FINAL  Final  MRSA PCR Screening     Status: None   Collection Time: 01/28/17  5:57 PM  Result Value Ref Range Status   MRSA by PCR NEGATIVE NEGATIVE Final    Comment:        The GeneXpert MRSA Assay (FDA approved for NASAL specimens only), is one component of a comprehensive MRSA colonization surveillance program. It is not intended to diagnose MRSA infection nor to guide or monitor treatment for MRSA infections.      Invalid input(s): PROCALCITONIN, LACTICACIDVEN   Radiology Studies: US Renal  Result Date: 01/29/2017 CLINICAL DATA:  Acute onset of renal insufficiency. Initial encounter. EXAM: RENAL / URINARY TRACT ULTRASOUND COMPLETE COMPARISON:  None. FINDINGS: Right Kidney: Length: 11.3 cm. Echogenicity within normal limits. No mass or hydronephrosis visualized. Left Kidney: Length: 13.5 cm. Echogenicity within normal limits. No mass or hydronephrosis visualized. Bladder: Decompressed and not well characterized. IMPRESSION: Evaluation is somewhat suboptimal due to overlying structures. No evidence of hydronephrosis. Unremarkable renal ultrasound. Electronically Signed   By: Garald Balding M.D.   On: 01/29/2017 00:29   Dg Chest Port 1 View  Result Date: 01/29/2017 CLINICAL DATA:  56 year old male with shortness of breath. Subsequent encounter. EXAM: PORTABLE CHEST 1 VIEW COMPARISON:  01/28/2017. FINDINGS: Post CABG.  Cardiomegaly.  Pulmonary edema. Limited for evaluating mediastinal structures or detecting underlying mass. No  pneumothorax. IMPRESSION: Similar appearance of cardiomegaly and pulmonary edema. Electronically Signed   By: Genia Del M.D.   On: 01/29/2017 07:02   US Abdomen Limited Ruq  Result Date: 01/29/2017 CLINICAL DATA:  Acute onset of hyperammonemia.  Initial encounter. EXAM: ULTRASOUND ABDOMEN LIMITED RIGHT UPPER QUADRANT COMPARISON:  None. FINDINGS: Gallbladder: No gallstones or wall thickening visualized. Contracted and difficult to fully characterize. No sonographic Murphy sign noted by sonographer. Common bile duct: Diameter: 0.4 cm, within normal limits in caliber. Liver: No focal lesion identified. Increased parenchymal  echogenicity and coarsened echotexture likely reflects fatty infiltration. IMPRESSION: 1. No acute abnormality seen at the right upper quadrant. 2. Gallbladder contracted and difficult to fully characterize, though grossly unremarkable in appearance. 3. Diffuse fatty infiltration within the liver. Electronically Signed   By: Garald Balding M.D.   On: 01/29/2017 00:30        Scheduled Meds: . apixaban  5 mg Oral BID  . artificial tears  1 application Right Eye QHS  . atenolol  50 mg Oral BID  . carbamazepine  200 mg Oral QHS   And  . [START ON 02/01/2017] carbamazepine  200 mg Oral BID  . diltiazem  120 mg Oral Daily  . furosemide  40 mg Intravenous BID  . isosorbide mononitrate  30 mg Oral BID  . lactulose  30 g Oral Once  . phosphorus  500 mg Oral Daily  . sodium chloride flush  3 mL Intravenous Q12H  . topiramate  100 mg Oral Daily   Continuous Infusions: . sodium chloride    . cefTRIAXone (ROCEPHIN)  IV Stopped (01/30/17 1243)     LOS: 2 days    Time spent: 35 minutes    Khrystyne Arpin A, MD [facility] Pager [phone]  If 7PM-7AM, please contact night-coverage [email or web address] Password [email or web address] 01/30/2017, 1:48 PM

## 2017-01-30 NOTE — Progress Notes (Signed)
Patient resting comfortably on 4L [place] with no respiratory distress noted at this time. BIPAP is not needed. RT will monitor as needed.

## 2017-01-30 NOTE — Progress Notes (Signed)
  Echocardiogram 2D Echocardiogram has been performed.  Darlina Sicilian M 01/30/2017, 2:37 PM

## 2017-01-30 NOTE — Progress Notes (Signed)
Progress Note  Patient Name: [name]. Date of Encounter: 01/30/2017  Primary Cardiologist: Thornell Mule ( 2017)   Subjective   56 yo with hx of CAD, CABG Admitted with SIRS , Found to have atrial fib  Has diuresed 6.2 liters so far this admission Echo has been ordered   Inpatient Medications    Scheduled Meds: . artificial tears  1 application Right Eye QHS  . atenolol  50 mg Oral BID  . carbamazepine  200 mg Oral QHS   And  . [START ON 02/01/2017] carbamazepine  200 mg Oral BID  . enoxaparin (LOVENOX) injection  40 mg Subcutaneous Q24H  . furosemide  40 mg Intravenous BID  . isosorbide mononitrate  30 mg Oral BID  . lactulose  30 g Oral Once  . phosphorus  500 mg Oral Daily  . sodium chloride flush  3 mL Intravenous Q12H  . topiramate  100 mg Oral Daily   Continuous Infusions: . sodium chloride    . cefTRIAXone (ROCEPHIN)  IV Stopped (01/29/17 1235)  . diltiazem (CARDIZEM) infusion 7.5 mg/hr (01/30/17 0710)   PRN Meds: sodium chloride, acetaminophen, albuterol, hydrALAZINE, HYDROcodone-acetaminophen, ondansetron (ZOFRAN) IV, sodium chloride flush   Vital Signs    Vitals:   01/29/17 2300 01/30/17 0405 01/30/17 0800 01/30/17 0900  BP: 127/62 121/73 116/75 119/72  Pulse: 68 60 61 74  Resp: (!) 29 (!) 26 (!) 26 (!) 27  Temp: 100 F (37.8 C) 98.1 F (36.7 C) 98 F (36.7 C)   TempSrc: Oral Oral Oral   SpO2: 95%  96% 96%  Weight:  (!) 384 lb 0.7 oz (174.2 kg)    Height:        Intake/Output Summary (Last 24 hours) at 01/30/17 1103 Last data filed at 01/30/17 6834  Gross per 24 hour  Intake             1418 ml  Output             3300 ml  Net            -1882 ml   Filed Weights   01/28/17 1612 01/29/17 0451 01/30/17 0405  Weight: (!) 382 lb 11.5 oz (173.6 kg) (!) 385 lb 12.9 oz (175 kg) (!) 384 lb 0.7 oz (174.2 kg)    Telemetry    Atrial fib  - Personally Reviewed  ECG    Atrial fib  - Personally Reviewed  Physical Exam   GEN:  morbidly obese man, lying in bed  Neck: No JVD Cardiac: Irreg. Irreg.  Respiratory: Clear to auscultation bilaterally. GI: morbidly obese  MS: large legs, no pitting edem a  Neuro:  Nonfocal  Psych: Normal affect   Labs    Chemistry Recent Labs Lab 01/28/17 1102  01/28/17 1900 01/29/17 0605 01/30/17 0412  NA 144  --  141 145 143  K 3.6  --  3.5 3.3* 3.0*  CL 95*  --  89* 92* 91*  CO2 39*  --  40* 44* 42*  GLUCOSE 209*  --  188* 206* 168*  BUN 22*  --  22* 23* 24*  CREATININE 1.48*  < > 1.40* 1.43* 1.38*  CALCIUM 9.2  --  8.7* 9.0 8.6*  PROT 7.4  --   --   --   --   ALBUMIN 2.9*  --   --   --   --   AST 27  --   --   --   --  ALT 53  --   --   --   --   ALKPHOS 115  --   --   --   --   BILITOT 0.6  --   --   --   --   GFRNONAA 52*  < > 55* 54* 56*  GFRAA 60*  < > >60 >60 >60  ANIONGAP 10  --  12 9 10   < > = values in this interval not displayed.   Hematology Recent Labs Lab 01/28/17 1646 01/29/17 0605 01/30/17 0412  WBC 12.4* 12.0* 12.7*  RBC 4.03* 4.09* 4.08*  HGB 12.7* 13.0 12.8*  HCT 45.0 45.0 44.1  MCV 111.7* 110.0* 108.1*  MCH 31.5 31.8 31.4  MCHC 28.2* 28.9* 29.0*  RDW 13.0 12.8 12.8  PLT 240 236 288    Cardiac Enzymes Recent Labs Lab 01/28/17 1102 01/28/17 1646 01/28/17 2259 01/29/17 0605  TROPONINI 0.07* 0.03* 0.04* 0.03*   No results for input(s): TROPIPOC in the last 168 hours.   BNP Recent Labs Lab 01/28/17 1102  BNP 394.6*     DDimer No results for input(s): DDIMER in the last 168 hours.   Radiology    Ct Head Wo Contrast  Result Date: 01/28/2017 CLINICAL DATA:  56 year old male with altered mental status, last seen normal 1100 hours yesterday. EXAM: CT HEAD WITHOUT CONTRAST TECHNIQUE: Contiguous axial images were obtained from the base of the skull through the vertex without intravenous contrast. COMPARISON:  Head CT without contrast 11/19/2012 and earlier. FINDINGS: Brain: Chronic encephalomalacia along the right  cerebellopontine angle. Small chronic right lateral posterior fossa extra-axial hygroma has decreased since 2014. Questionable mild hypodensity in the left central cerebellum which would be new since 2014. Supratentorial gray-white matter differentiation is stable and within normal limits. No midline shift, ventriculomegaly, intracranial hemorrhage or evidence of cortically based acute infarction. Vascular: Mild Calcified atherosclerosis at the skull base. Skull: Chronic right suboccipital craniectomy changes again noted. An associated postoperative pseudomeningocele has resolved. Stable visualized osseous structures. Sinuses/Orbits: Mild mucosal thickening or small fluid level in the left maxillary sinus. Other paranasal sinuses are stable in clear. Right tympanic cavity and mastoids have cleared since 2014, but there is a new left mastoid effusion. The left tympanic cavity remains clear. Other: No acute orbit or scalp soft tissue findings. IMPRESSION: 1. Questionable hypodensity in the central left cerebellum, such as due to small vessel ischemia, which would be new since 2014, but may be artifact. 2. Expected evolution of postoperative changes from 2014 right cerebellopontine angle resection. 3. Left mastoid effusion is new since 2014 and likely postinflammatory. Previously-seen right tympanic and mastoid effusion has resolved. Electronically Signed   By: Genevie Ann M.D.   On: 01/28/2017 11:59   US Renal  Result Date: 01/29/2017 CLINICAL DATA:  Acute onset of renal insufficiency. Initial encounter. EXAM: RENAL / URINARY TRACT ULTRASOUND COMPLETE COMPARISON:  None. FINDINGS: Right Kidney: Length: 11.3 cm. Echogenicity within normal limits. No mass or hydronephrosis visualized. Left Kidney: Length: 13.5 cm. Echogenicity within normal limits. No mass or hydronephrosis visualized. Bladder: Decompressed and not well characterized. IMPRESSION: Evaluation is somewhat suboptimal due to overlying structures. No evidence  of hydronephrosis. Unremarkable renal ultrasound. Electronically Signed   By: Garald Balding M.D.   On: 01/29/2017 00:29   Dg Chest Port 1 View  Result Date: 01/29/2017 CLINICAL DATA:  56 year old male with shortness of breath. Subsequent encounter. EXAM: PORTABLE CHEST 1 VIEW COMPARISON:  01/28/2017. FINDINGS: Post CABG.  Cardiomegaly.  Pulmonary edema. Limited  for evaluating mediastinal structures or detecting underlying mass. No pneumothorax. IMPRESSION: Similar appearance of cardiomegaly and pulmonary edema. Electronically Signed   By: Genia Del M.D.   On: 01/29/2017 07:02   Dg Chest Portable 1 View  Result Date: 01/28/2017 CLINICAL DATA:  Altered mental status. EXAM: PORTABLE CHEST 1 VIEW COMPARISON:  Single-view of the chest 02/21/2016 and 11/20/2012. FINDINGS: The patient is status post CABG. There is cardiomegaly and pulmonary edema. No consolidative process, pneumothorax or effusion is identified. No acute bony abnormality. IMPRESSION: Cardiomegaly and pulmonary edema. Electronically Signed   By: Inge Rise M.D.   On: 01/28/2017 11:11   US Abdomen Limited Ruq  Result Date: 01/29/2017 CLINICAL DATA:  Acute onset of hyperammonemia.  Initial encounter. EXAM: ULTRASOUND ABDOMEN LIMITED RIGHT UPPER QUADRANT COMPARISON:  None. FINDINGS: Gallbladder: No gallstones or wall thickening visualized. Contracted and difficult to fully characterize. No sonographic Murphy sign noted by sonographer. Common bile duct: Diameter: 0.4 cm, within normal limits in caliber. Liver: No focal lesion identified. Increased parenchymal echogenicity and coarsened echotexture likely reflects fatty infiltration. IMPRESSION: 1. No acute abnormality seen at the right upper quadrant. 2. Gallbladder contracted and difficult to fully characterize, though grossly unremarkable in appearance. 3. Diffuse fatty infiltration within the liver. Electronically Signed   By: Garald Balding M.D.   On: 01/29/2017 00:30    Cardiac  Studies    Patient Profile     56 y.o. male with new dx of atrial fib   Assessment & Plan    1. Atrial fib - would recommend anticoagulation  CHADS2VASC is 6.   Currently on Lovenox. Will DC lovenox Start Eliquis - pharmacy to help with the transition  Will DC Dilt drip.  Start Cardizem CD 120 . Will need to check EF on echo. If his EF is reduced, will DC cardizem and increase beta blocker  2. Acute CHF - echo pending Has diruesed 6.2 liters  Creatinine is up slightly   3. SIRS - plan per IM    Signed, Mertie Moores, MD  01/30/2017, 11:03 AM

## 2017-01-30 NOTE — Plan of Care (Signed)
Problem: Fluid Volume: Goal: Ability to maintain a balanced intake and output will improve Outcome: Progressing Patient asking for large amounts of water, educated about the need to diurese and the complications with increased fluid volume.

## 2017-01-30 NOTE — Evaluation (Signed)
Physical Therapy Evaluation Patient Details Name: [name]. MRN: [id number] DOB: 18-Nov-1960 Today's Date: 01/30/2017   History of Present Illness  Pt adm with UTI/sepsis and acute encephalopathy. PMH - brain tumor, stroke, CAD, heart failure, morbid obesity.  Clinical Impression  Pt admitted with above diagnosis and presents to PT with functional limitations due to deficits listed below (See PT problem list). Pt needs skilled PT to maximize independence and safety to allow discharge to home with wife and mother. Expect pt's mobility will return to baseline relatively quickly.     Follow Up Recommendations Home health PT;Supervision - Intermittent    Equipment Recommendations  None recommended by PT    Recommendations for Other Services       Precautions / Restrictions Precautions Precautions: Fall Restrictions Weight Bearing Restrictions: No      Mobility  Bed Mobility Overal bed mobility: Needs Assistance Bed Mobility: Supine to Sit     Supine to sit: Min assist     General bed mobility comments: Assist for pt to pull up on hand  Transfers Overall transfer level: Needs assistance Equipment used: Rolling walker (2 wheeled) Transfers: Sit to/from [facility] Sit to Stand: Min guard Stand pivot transfers: Min guard       General transfer comment: Assist for safety and balance  Ambulation/Gait                Stairs            Wheelchair Mobility    Modified Rankin (Stroke Patients Only)       Balance Overall balance assessment: Needs assistance Sitting-balance support: No upper extremity supported;Feet supported Sitting balance-Leahy Scale: Good     Standing balance support: Bilateral upper extremity supported Standing balance-Leahy Scale: Poor Standing balance comment: walker and supervision                             Pertinent Vitals/Pain Pain Assessment: Faces Faces Pain Scale: Hurts a little  bit Pain Location: back Pain Intervention(s): Limited activity within patient's tolerance;Monitored during session    Home Living Family/patient expects to be discharged to:: Private residence Living Arrangements: Spouse/significant other Available Help at Discharge: Family;Available 24 hours/day (wife works days. Mother comes over during the day) Type of Home: Mobile home Home Access: Ramped entrance     Home Layout: One level Home Equipment: Walker - 4 wheels      Prior Function Level of Independence: Needs assistance   Gait / Transfers Assistance Needed: uses rollator for mobility  ADL's / Homemaking Assistance Needed: needs assist from wife to sponge bathe        Hand Dominance   Dominant Hand: Right    Extremity/Trunk Assessment   Upper Extremity Assessment Upper Extremity Assessment: Generalized weakness    Lower Extremity Assessment Lower Extremity Assessment: Generalized weakness       Communication   Communication: No difficulties  Cognition Arousal/Alertness: Awake/alert Behavior During Therapy: WFL for tasks assessed/performed Overall Cognitive Status: Within Functional Limits for tasks assessed                                 General Comments: Pt with tendency to order everyone in the room around      General Comments      Exercises     Assessment/Plan    PT Assessment Patient needs continued PT services  PT  Problem List Decreased strength;Decreased activity tolerance;Decreased balance;Decreased mobility;Obesity       PT Treatment Interventions DME instruction;Gait training;Functional mobility training;Therapeutic activities;Therapeutic exercise;Balance training;Patient/family education    PT Goals (Current goals can be found in the Care Plan section)  Acute Rehab PT Goals Patient Stated Goal: return home PT Goal Formulation: With patient Time For Goal Achievement: 02/06/17 Potential to Achieve Goals: Good    Frequency  Min 3X/week   Barriers to discharge        Co-evaluation               AM-PAC PT "6 Clicks" Daily Activity  Outcome Measure Difficulty turning over in bed (including adjusting bedclothes, sheets and blankets)?: Total Difficulty moving from lying on back to sitting on the side of the bed? : Total Difficulty sitting down on and standing up from a chair with arms (e.g., wheelchair, bedside commode, etc,.)?: Total Help needed moving to and from a bed to chair (including a wheelchair)?: A Little Help needed walking in hospital room?: A Little Help needed climbing 3-5 steps with a railing? : A Lot 6 Click Score: 11    End of Session Equipment Utilized During Treatment: Oxygen Activity Tolerance: Patient tolerated treatment well Patient left: in chair;with call bell/phone within reach Nurse Communication: Mobility status PT Visit Diagnosis: Muscle weakness (generalized) (M62.81);Difficulty in walking, not elsewhere classified (R26.2)    Time: 7619-5093 PT Time Calculation (min) (ACUTE ONLY): 34 min   Charges:   PT Evaluation $PT Eval Moderate Complexity: 1 Procedure PT Treatments $Therapeutic Activity: 8-22 mins   PT G Codes:        [facility] PT Onset 01/30/2017, 4:02 PM

## 2017-01-30 NOTE — Progress Notes (Signed)
ANTICOAGULATION CONSULT NOTE - Initial Consult  Pharmacy Consult for Apixaban Indication: atrial fibrillation  Allergies  Allergen Reactions  . Morphine And Related Anaphylaxis    "makes me stop breathing"  . Ivp Dye [Iodinated Diagnostic Agents]     Passing out  . Other     Steroids makes hearts race    Patient Measurements: Height: 5\' 11"  (180.3 cm) Weight: (!) 384 lb 0.7 oz (174.2 kg) IBW/kg (Calculated) : 75.3  Vital Signs: Temp: 98 F (36.7 C) (06/12 0800) Temp Source: Oral (06/12 0800) BP: 119/72 (06/12 0900) Pulse Rate: 74 (06/12 0900)  Labs:  Recent Labs  01/28/17 1102 01/28/17 1646 01/28/17 1900 01/28/17 2259 01/29/17 0605 01/30/17 0412  HGB 13.6 12.7*  --   --  13.0 12.8*  HCT 47.1 45.0  --   --  45.0 44.1  PLT 240 240  --   --  236 288  LABPROT 14.7  --   --   --   --   --   INR 1.14  --   --   --   --   --   CREATININE 1.48* 1.45* 1.40*  --  1.43* 1.38*  CKTOTAL 22*  --   --   --   --   --   TROPONINI 0.07* 0.03*  --  0.04* 0.03*  --     Estimated Creatinine Clearance: 98.3 mL/min (A) (by C-G formula based on SCr of 1.38 mg/dL (H)).   Medical History: Past Medical History:  Diagnosis Date  . Brain tumor ([place])    Takes Topamax so patient will not have headaches  . Bruises easily   . CAD (coronary artery disease)    Cath 09, 99% LAD  . Cardiomyopathy, ischemic    Ischemic  . CHF (congestive heart failure) ([place])   . Cyst near coccyx   . Cyst near tailbone   . Diabetes mellitus without complication (HCC)    borderline  . Edema of both legs    Takes Lasix  . Fibromyalgia   . Gout   . Headache(784.0)    with lights  . Hypertension    dr Percival Spanish  . Kidney stones   . Myocardial infarction ([place])   . Obesity   . Pneumonia    hx of  . Shortness of breath   . Stroke [facility])    Affected vision, walking, and facial drooping on right face    Assessment: 51 YOM who presented on 6/10 with acute encephalopathy and was subsequently found to  have new Afib with RVR. Cardiology has consulted pharmacy to start apixaban.  The patient appears to have started carbamazepine (Tegretol) in May for peripheral neuropathy. This is contraindicated with apixaban as it can reduce the concentrations significantly. This was discussed with Dr. Hartford Poli and will be discontinued. It appears the patient has been on gabapentin in the past - which would be an option to consider restarting that would not interact with apixaban.  Goal of Therapy:  Appropriate anticoagulation for indication and hepatic/renal function    Plan:  1. Start apixaban 5 mg bid 2. Will plan to educate prior to discharge.  3. Will sign off of consult but will monitor peripherally for any necessary dose adjustments  Thank you for allowing pharmacy to be a part of this patient's care.  Alycia Rossetti, PharmD, BCPS Clinical Pharmacist Pager: [phone] Clinical phone for 01/30/2017 from 7a-3:30p: [phone] If after 3:30p, please call main pharmacy at: [phone] 01/30/2017 1:55 PM

## 2017-01-31 LAB — BASIC METABOLIC PANEL
ANION GAP: 14 (ref 5–15)
BUN: 24 mg/dL — ABNORMAL HIGH (ref 6–20)
CHLORIDE: 88 mmol/L — AB (ref 101–111)
CO2: 38 mmol/L — ABNORMAL HIGH (ref 22–32)
Calcium: 8.7 mg/dL — ABNORMAL LOW (ref 8.9–10.3)
Creatinine, Ser: 1.5 mg/dL — ABNORMAL HIGH (ref 0.61–1.24)
GFR calc non Af Amer: 51 mL/min — ABNORMAL LOW (ref >60)
GFR, EST AFRICAN AMERICAN: 59 mL/min — AB (ref >60)
Glucose, Bld: 191 mg/dL — ABNORMAL HIGH (ref 65–99)
Potassium: 3.1 mmol/L — ABNORMAL LOW (ref 3.5–5.1)
SODIUM: 140 mmol/L (ref 135–145)

## 2017-01-31 LAB — GLUCOSE, CAPILLARY
GLUCOSE-CAPILLARY: 204 mg/dL — AB (ref 65–99)
GLUCOSE-CAPILLARY: 163 mg/dL — AB (ref 65–99)
Glucose-Capillary: 206 mg/dL — ABNORMAL HIGH (ref 65–99)
Glucose-Capillary: 193 mg/dL — ABNORMAL HIGH (ref 65–99)
Glucose-Capillary: 158 mg/dL — ABNORMAL HIGH (ref 65–99)

## 2017-01-31 MED ORDER — POTASSIUM CHLORIDE CRYS ER 20 MEQ PO TBCR
40.0000 meq | EXTENDED_RELEASE_TABLET | Freq: Every day | ORAL | Status: DC
  Administered 2017-01-31: 40 meq via ORAL
  Filled 2017-01-31: qty 2

## 2017-01-31 MED ORDER — FUROSEMIDE 40 MG PO TABS
40.0000 mg | ORAL_TABLET | Freq: Two times a day (BID) | ORAL | Status: DC
  Administered 2017-01-31 – 2017-02-03 (×6): 40 mg via ORAL
  Filled 2017-01-31 (×6): qty 1

## 2017-01-31 NOTE — Progress Notes (Signed)
Urine became pink color, TRH on call physician made aware.

## 2017-01-31 NOTE — Progress Notes (Signed)
Patient arrive to unit on bariatric bed with nursing staff. Patient is alert, oriented and stable upon arrival. Patient has stage 2 to his left buttock upon initial 2 RN skin assessment.

## 2017-01-31 NOTE — Progress Notes (Signed)
Inpatient Diabetes Program Recommendations  AACE/ADA: New Consensus Statement on Inpatient Glycemic Control (2015)  Target Ranges:  Prepandial:   less than 140 mg/dL      Peak postprandial:   less than 180 mg/dL (1-2 hours)      Critically ill patients:  140 - 180 mg/dL   Results for [name], [name] (MRN [id number]) as of 01/31/2017 09:56  Ref. Range 01/30/2017 07:51 01/30/2017 11:50 01/30/2017 17:01 01/30/2017 19:43 01/31/2017 01:10 01/31/2017 05:24 01/31/2017 07:59  Glucose-Capillary Latest Ref Range: 65 - 99 mg/dL 172 (H) 220 (H) 168 (H) 195 (H) 204 (H) 206 (H) 163 (H)   Review of Glycemic Control  Diabetes history: None Outpatient Diabetes medications: None  Inpatient Diabetes Program Recommendations:   Please consider checking A1C to determine pre-hospitalization glycemic control.  Also consider adding Novolog Sensitive correction tid with meals and HS.   Thanks,  Tama Headings RN, MSN, [facility] Inpatient Diabetes Coordinator Team Pager [phone] (8a-5p)

## 2017-01-31 NOTE — Progress Notes (Addendum)
Progress Note  Patient Name: [name]. Date of Encounter: 01/31/2017  Primary Cardiologist:   Gwenlyn Found ( 2017)   Subjective   56 yo with hx of CAD, CABG Admitted with SIRS , Found to have atrial fib  Has diuresed 9.2 liters so far this admission Echo was technically difficult due to patient size but showed normal LV systolic function    Inpatient Medications    Scheduled Meds: . apixaban  5 mg Oral BID  . artificial tears  1 application Right Eye QHS  . atenolol  50 mg Oral BID  . diltiazem  120 mg Oral Daily  . furosemide  40 mg Oral BID  . isosorbide mononitrate  30 mg Oral BID  . lactulose  30 g Oral Once  . phosphorus  500 mg Oral Daily  . potassium chloride  40 mEq Oral Daily  . sodium chloride flush  3 mL Intravenous Q12H  . topiramate  100 mg Oral Daily   Continuous Infusions: . sodium chloride    . cefTRIAXone (ROCEPHIN)  IV Stopped (01/30/17 1243)   PRN Meds: sodium chloride, acetaminophen, albuterol, hydrALAZINE, HYDROcodone-acetaminophen, ondansetron (ZOFRAN) IV, sodium chloride flush   Vital Signs    Vitals:   01/31/17 0412 01/31/17 0527 01/31/17 0800 01/31/17 0901  BP:  112/68 (!) 100/53 122/68  Pulse:  73 73 80  Resp:   17   Temp:  97.8 F (36.6 C) 98.4 F (36.9 C)   TempSrc:  Oral Oral   SpO2:   95%   Weight: (!) 368 lb (166.9 kg)     Height:        Intake/Output Summary (Last 24 hours) at 01/31/17 0929 Last data filed at 01/31/17 0528  Gross per 24 hour  Intake           927.63 ml  Output             3750 ml  Net         -2822.37 ml   Filed Weights   01/29/17 0451 01/30/17 0405 01/31/17 0412  Weight: (!) 385 lb 12.9 oz (175 kg) (!) 384 lb 0.7 oz (174.2 kg) (!) 368 lb (166.9 kg)    Telemetry    Atrial fib  - Personally Reviewed  ECG    Atrial fib  - Personally Reviewed  Physical Exam   GEN: morbidly obese man, lying in bed  Neck: No JVD Cardiac: irreg. Irreg. Marland Kitchen  Respiratory: clear to ausc.  GI: morbidly obese    MS: very large legs, no edema   Neuro:  nonfocal Psych: Normal affect   Labs    Chemistry Recent Labs Lab 01/28/17 1102  01/29/17 0605 01/30/17 0412 01/31/17 0411  NA 144  < > 145 143 140  K 3.6  < > 3.3* 3.0* 3.1*  CL 95*  < > 92* 91* 88*  CO2 39*  < > 44* 42* 38*  GLUCOSE 209*  < > 206* 168* 191*  BUN 22*  < > 23* 24* 24*  CREATININE 1.48*  < > 1.43* 1.38* 1.50*  CALCIUM 9.2  < > 9.0 8.6* 8.7*  PROT 7.4  --   --   --   --   ALBUMIN 2.9*  --   --   --   --   AST 27  --   --   --   --   ALT 53  --   --   --   --   ALKPHOS  115  --   --   --   --   BILITOT 0.6  --   --   --   --   GFRNONAA 52*  < > 54* 56* 51*  GFRAA 60*  < > >60 >60 59*  ANIONGAP 10  < > 9 10 14   < > = values in this interval not displayed.   Hematology  Recent Labs Lab 01/28/17 1646 01/29/17 0605 01/30/17 0412  WBC 12.4* 12.0* 12.7*  RBC 4.03* 4.09* 4.08*  HGB 12.7* 13.0 12.8*  HCT 45.0 45.0 44.1  MCV 111.7* 110.0* 108.1*  MCH 31.5 31.8 31.4  MCHC 28.2* 28.9* 29.0*  RDW 13.0 12.8 12.8  PLT 240 236 288    Cardiac Enzymes  Recent Labs Lab 01/28/17 1102 01/28/17 1646 01/28/17 2259 01/29/17 0605  TROPONINI 0.07* 0.03* 0.04* 0.03*   No results for input(s): TROPIPOC in the last 168 hours.   BNP  Recent Labs Lab 01/28/17 1102  BNP 394.6*     DDimer No results for input(s): DDIMER in the last 168 hours.   Radiology    No results found.  Cardiac Studies    Patient Profile     56 y.o. male with new dx of atrial fib   Assessment & Plan    1. Atrial fib -rate is well controlled.   HR is 80  Continue Eliquis   2. Acute CHF -   Echo shows normal LV systolic function ( although the study was technically very difficult due to patient size. Continue meds   3. SIRS - plan per IM   Will sign off. Follow up with Dr. Gwenlyn Found   Signed, Mertie Moores, MD  01/31/2017, 9:29 AM

## 2017-01-31 NOTE — Progress Notes (Signed)
PT Cancellation Note  Patient Details Name: [name]. MRN: [id number] DOB: October 09, 1960   Cancelled Treatment:    Reason Eval/Treat Not Completed: Other (comment), patient in process of transferring unit, will attempt back as able.   Duncan Dull 01/31/2017, 2:27 PM

## 2017-01-31 NOTE — Progress Notes (Signed)
Physical Therapy Treatment Patient Details Name: [name]. MRN: [id number] DOB: 02/16/1961 Today's Date: 01/31/2017    History of Present Illness Pt adm with UTI/sepsis and acute encephalopathy. PMH - brain tumor, stroke, CAD, heart failure, morbid obesity.    PT Comments    Pt limited today by decreased activity tolerance and decreased O2 saturation on 2L supp O2 during gait. Pt had 95% O2 saturation during rest, but decreased to 84% after 88ft of ambulation with rollator. Pt required seated rest break and took 2-3 minutes to improve O2 saturation back to 92% with supp O2. Pt would benefit from cont skilled PT to improve strength and activity tolerance to improve functional mobility. Current plan of care remains appropriate.   Follow Up Recommendations  Home health PT;Supervision - Intermittent     Equipment Recommendations  None recommended by PT    Recommendations for Other Services       Precautions / Restrictions Precautions Precautions: Fall Restrictions Weight Bearing Restrictions: No    Mobility  Bed Mobility Overal bed mobility: Needs Assistance Bed Mobility: Supine to Sit     Supine to sit: Min assist     General bed mobility comments: Assist for pt to pull up on hand  Transfers Overall transfer level: Needs assistance Equipment used: 4-wheeled walker (Bariatric rollator ) Transfers: Sit to/from Stand Sit to Stand: Min Cathryne Mancebo         General transfer comment: min Ronon Ferger for safety. VCs required to make sure brakes are locked on rollator prior to standing  Ambulation/Gait Ambulation/Gait assistance: Supervision Ambulation Distance (Feet): 80 Feet (63ft x2) Assistive device: 4-wheeled walker (bariatric rollator ) Gait Pattern/deviations: Step-through pattern;Decreased stride length;Wide base of support Gait velocity: decreased Gait velocity interpretation: Below normal speed for age/gender General Gait Details: Pt ambulated with a wide base  of support. O2 saturation decreased to 84% on 3L supp O2 via nasal cannula and required seated rest break after 68ft of gait. O2 sat increased to 92% after 2-3 minutes of rest.    Stairs            Wheelchair Mobility    Modified Rankin (Stroke Patients Only)       Balance Overall balance assessment: Needs assistance Sitting-balance support: No upper extremity supported;Feet supported Sitting balance-Leahy Scale: Good     Standing balance support: Bilateral upper extremity supported;During functional activity Standing balance-Leahy Scale: Poor                              Cognition Arousal/Alertness: Awake/alert Behavior During Therapy: WFL for tasks assessed/performed Overall Cognitive Status: Within Functional Limits for tasks assessed                                 General Comments: Pt very concerned that O2 and air mattress was not working, but was reassured that they were working fine.      Exercises      General Comments        Pertinent Vitals/Pain Pain Assessment: Faces Faces Pain Scale: Hurts a little bit Pain Location: back Pain Intervention(s): Monitored during session    Home Living                      Prior Function            PT Goals (current goals can now be found in  the care plan section) Acute Rehab PT Goals Patient Stated Goal: to go home PT Goal Formulation: With patient Time For Goal Achievement: 02/06/17 Potential to Achieve Goals: Good Progress towards PT goals: Progressing toward goals    Frequency    Min 3X/week      PT Plan Current plan remains appropriate    Co-evaluation              AM-PAC PT "6 Clicks" Daily Activity  Outcome Measure  Difficulty turning over in bed (including adjusting bedclothes, sheets and blankets)?: Total Difficulty moving from lying on back to sitting on the side of the bed? : Total Difficulty sitting down on and standing up from a chair with  arms (e.g., wheelchair, bedside commode, etc,.)?: Total Help needed moving to and from a bed to chair (including a wheelchair)?: A Little Help needed walking in hospital room?: A Little Help needed climbing 3-5 steps with a railing? : A Lot 6 Click Score: 11    End of Session Equipment Utilized During Treatment: Oxygen Activity Tolerance: Patient tolerated treatment well Patient left: in bed;with call bell/phone within reach;with bed alarm set Nurse Communication: Mobility status PT Visit Diagnosis: Muscle weakness (generalized) (M62.81);Difficulty in walking, not elsewhere classified (R26.2)     Time: 0034-9611 PT Time Calculation (min) (ACUTE ONLY): 17 min  Charges:  $Gait Training: 8-22 mins                    G Codes:       Loma Sousa, SPT  [phone]   Loma Sousa  01/31/2017, 5:00 PM

## 2017-01-31 NOTE — Progress Notes (Signed)
PROGRESS NOTE                                                                                                                                                                                                             Patient Demographics:    [name], is a 56 y.o. male, DOB - 11/27/1960, HCW:[id number]  Admit date - 01/28/2017   Admitting Physician Elwin Mocha, MD  Outpatient Primary MD for the patient is Leonard Downing, MD  LOS - 3  Outpatient Specialists:  Chief Complaint  Patient presents with  . Altered Mental Status       Brief Narrative  56 year old morbidly obese male with history of brain tumor, myalgia, gout, hypertension, kidney stones, history of stroke with left eye blindness, diastolic CHF, CAD with history of MI presented to the ED with unresponsiveness. No new medication changes (was started on medication for gout which he has not had taken). Patient found to have acute metabolic encephalopathy due to sepsis secondary to UTI.   Subjective:   Patient reports feeling tired. Denies chest pain, shortness of breath or palpitations. Reports having difficulty getting out of bed.   Assessment  & Plan :   Principal problem Sepsis with Acute metabolic encephalopathy Possibly secondary to UTI. On empiric Rocephin. Urine culture with next growth. Blood cultures negative. Also had hypercapnia on admission ABG which probably is baseline due to OHS. Somnolence and confusion resolving.  Acute on chronic diastolic CHF Bibasilar opacity and moderate pulmonary edema on chest x-ray. Was requiring BiPAP off-and-on since admission. 2-D echo with EF of 55-60%, moderate LVH (poor image quality and patient refused Definity). -Currently maintaining sats on 3 L nasal cannula. Diuresed well with IV Lasix. ( -8.8 L ). Will transition him to oral Lasix today. Strict I/O and daily weight. If able to mobilize  better will remove Foley catheter in the morning (unable to use a condom catheter and patient refusing to use bedside urinal). Cardiology consult appreciated. Have signed off. (Patient should follow-up with Dr. Gwenlyn Found as outpatient).  New onset atrial fibrillation with RVR Required Cardizem drip on admission. Now rate controlled. Was on Lovenox prior to admission which is switched to eliquis, ( CHADS2Vasc of 6). patient on Tegretol which is discontinued due to its interaction with anticoagulation.  Hypokalemia Replenished.  Chronic hypercapnic respiratory failure Baseline  PCO2 around 65. Was requiring BiPAP off-and-on. I will order nighttime CPAP. He will need to be evaluated for outpatient's study.  Elevated troponin Possibly demand ischemia with acute CHF, A. fib and infection. Echo reviewed.  Acute kidney injury Isolated to sepsis. Stable on diuretic.  Peripheral neuropathy Was started on Tegretol recently by Dr. Tessa Lerner. Discontinued due to increase in traction with eliquis.  Chronic headache Continue Topamax.  Chronic gout Stable.   Elevated blood glucose Check A1c.  Morbid obesity/OHS Outpatient study.    Code Status : Full code  Family Communication  : At bedside  Disposition Plan  : Home with home health once improved, possibly in the next 24-48 hours.  Barriers For Discharge : Active symptoms. Transfer to telemetry.  Consults  :  Cardiology  Procedures  : 2-D echo  DVT Prophylaxis  :  eliquis  Lab Results  Component Value Date   PLT 288 01/30/2017    Antibiotics  :   Anti-infectives    Start     Dose/Rate Route Frequency Ordered Stop   01/29/17 1300  cefTRIAXone (ROCEPHIN) 1 g in dextrose 5 % 50 mL IVPB     1 g 100 mL/hr over 30 Minutes Intravenous Every 24 hours 01/28/17 1951     01/28/17 1215  cefTRIAXone (ROCEPHIN) 1 g in dextrose 5 % 50 mL IVPB     1 g 100 mL/hr over 30 Minutes Intravenous  Once 01/28/17 1200 01/28/17 1333         Objective:   Vitals:   01/31/17 0412 01/31/17 0527 01/31/17 0800 01/31/17 0901  BP:  112/68 (!) 100/53 122/68  Pulse:  73 73 80  Resp:   17   Temp:  97.8 F (36.6 C) 98.4 F (36.9 C)   TempSrc:  Oral Oral   SpO2:   95%   Weight: (!) 166.9 kg (368 lb)     Height:        Wt Readings from Last 3 Encounters:  01/31/17 (!) 166.9 kg (368 lb)  02/23/16 (!) 157.6 kg (347 lb 8 oz)  02/11/16 (!) 166.5 kg (367 lb 1.6 oz)     Intake/Output Summary (Last 24 hours) at 01/31/17 1140 Last data filed at 01/31/17 1000  Gross per 24 hour  Intake          1044.63 ml  Output             2400 ml  Net         -1355.37 ml     Physical Exam  Gen: Morbidly obese male appears fatigued HEENT: Left eye blind, right facial droop, no pallor, moist mucosa, no JVD Chest: Diminished breath sound body habitus CVS: S1 and S2 irregular, no murmurs rub or gallop GI: soft, NT, ND,  Foley catheter with pinkish urine Musculoskeletal: warm, trace pitting edema bilaterally CNS: Alert and oriented    Data Review:    CBC  Recent Labs Lab 01/28/17 1102 01/28/17 1646 01/29/17 0605 01/30/17 0412  WBC 14.4* 12.4* 12.0* 12.7*  HGB 13.6 12.7* 13.0 12.8*  HCT 47.1 45.0 45.0 44.1  PLT 240 240 236 288  MCV 110.8* 111.7* 110.0* 108.1*  MCH 32.0 31.5 31.8 31.4  MCHC 28.9* 28.2* 28.9* 29.0*  RDW 13.0 13.0 12.8 12.8  LYMPHSABS 1.3  --  1.3  --   MONOABS 2.2*  --  1.3*  --   EOSABS 0.3  --  0.2  --   BASOSABS 0.1  --  0.1  --  Chemistries   Recent Labs Lab 01/28/17 1102 01/28/17 1646 01/28/17 1900 01/29/17 0605 01/30/17 0412 01/31/17 0411  NA 144  --  141 145 143 140  K 3.6  --  3.5 3.3* 3.0* 3.1*  CL 95*  --  89* 92* 91* 88*  CO2 39*  --  40* 44* 42* 38*  GLUCOSE 209*  --  188* 206* 168* 191*  BUN 22*  --  22* 23* 24* 24*  CREATININE 1.48* 1.45* 1.40* 1.43* 1.38* 1.50*  CALCIUM 9.2  --  8.7* 9.0 8.6* 8.7*  MG  --  1.9  --  1.8 1.8  --   AST 27  --   --   --   --   --   ALT 53  --    --   --   --   --   ALKPHOS 115  --   --   --   --   --   BILITOT 0.6  --   --   --   --   --    ------------------------------------------------------------------------------------------------------------------ No results for input(s): CHOL, HDL, LDLCALC, TRIG, CHOLHDL, LDLDIRECT in the last 72 hours.  Lab Results  Component Value Date   HGBA1C 5.6 02/22/2016   ------------------------------------------------------------------------------------------------------------------ No results for input(s): TSH, T4TOTAL, T3FREE, THYROIDAB in the last 72 hours.  Invalid input(s): FREET3 ------------------------------------------------------------------------------------------------------------------ No results for input(s): VITAMINB12, FOLATE, FERRITIN, TIBC, IRON, RETICCTPCT in the last 72 hours.  Coagulation profile  Recent Labs Lab 01/28/17 1102  INR 1.14    No results for input(s): DDIMER in the last 72 hours.  Cardiac Enzymes  Recent Labs Lab 01/28/17 1646 01/28/17 2259 01/29/17 0605  TROPONINI 0.03* 0.04* 0.03*   ------------------------------------------------------------------------------------------------------------------    Component Value Date/Time   BNP 394.6 (H) 01/28/2017 1102    Inpatient Medications  Scheduled Meds: . apixaban  5 mg Oral BID  . artificial tears  1 application Right Eye QHS  . atenolol  50 mg Oral BID  . diltiazem  120 mg Oral Daily  . furosemide  40 mg Oral BID  . isosorbide mononitrate  30 mg Oral BID  . lactulose  30 g Oral Once  . phosphorus  500 mg Oral Daily  . potassium chloride  40 mEq Oral Daily  . sodium chloride flush  3 mL Intravenous Q12H  . topiramate  100 mg Oral Daily   Continuous Infusions: . sodium chloride    . cefTRIAXone (ROCEPHIN)  IV Stopped (01/30/17 1243)   PRN Meds:.sodium chloride, acetaminophen, albuterol, hydrALAZINE, HYDROcodone-acetaminophen, ondansetron (ZOFRAN) IV, sodium chloride flush  Micro  Results Recent Results (from the past 240 hour(s))  Culture, blood (Routine x 2)     Status: None (Preliminary result)   Collection Time: 01/28/17 11:00 AM  Result Value Ref Range Status   Specimen Description BLOOD LEFT HAND  Final   Special Requests IN PEDIATRIC BOTTLE Blood Culture adequate volume  Final   Culture NO GROWTH 2 DAYS  Final   Report Status PENDING  Incomplete  Culture, blood (Routine x 2)     Status: None (Preliminary result)   Collection Time: 01/28/17 11:07 AM  Result Value Ref Range Status   Specimen Description BLOOD LEFT WRIST  Final   Special Requests IN PEDIATRIC BOTTLE Blood Culture adequate volume  Final   Culture NO GROWTH 2 DAYS  Final   Report Status PENDING  Incomplete  Urine culture     Status: Abnormal   Collection Time: 01/28/17 12:36 PM  Result Value Ref Range Status   Specimen Description URINE, RANDOM  Final   Special Requests NONE  Final   Culture MULTIPLE SPECIES PRESENT, SUGGEST RECOLLECTION (A)  Final   Report Status 01/29/2017 FINAL  Final  MRSA PCR Screening     Status: None   Collection Time: 01/28/17  5:57 PM  Result Value Ref Range Status   MRSA by PCR NEGATIVE NEGATIVE Final    Comment:        The GeneXpert MRSA Assay (FDA approved for NASAL specimens only), is one component of a comprehensive MRSA colonization surveillance program. It is not intended to diagnose MRSA infection nor to guide or monitor treatment for MRSA infections.     Radiology Reports Ct Head Wo Contrast  Result Date: 01/28/2017 CLINICAL DATA:  56 year old male with altered mental status, last seen normal 1100 hours yesterday. EXAM: CT HEAD WITHOUT CONTRAST TECHNIQUE: Contiguous axial images were obtained from the base of the skull through the vertex without intravenous contrast. COMPARISON:  Head CT without contrast 11/19/2012 and earlier. 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 Small chronic right lateral posterior  fossa extra-axial hygroma has decreased since 2014. Questionable mild hypodensity in the left central cerebellum which would be new since 2014. Supratentorial gray-white matter differentiation is stable and within normal limits. No midline shift, ventriculomegaly, intracranial hemorrhage or evidence of cortically based acute infarction. Vascular: Mild Calcified atherosclerosis at the skull base. Skull: Chronic right suboccipital craniectomy changes again noted. An associated postoperative pseudomeningocele has resolved. Stable visualized osseous structures. Sinuses/Orbits: Mild mucosal thickening or small fluid level in the left maxillary sinus. Other paranasal sinuses are stable in clear. Right tympanic cavity and mastoids have cleared since 2014, but there is a new left mastoid effusion. The left tympanic cavity remains clear. Other: No acute orbit or scalp soft tissue findings. IMPRESSION: 1. Questionable hypodensity in the central left cerebellum, such as due to small vessel ischemia, which would be new since 2014, but may be artifact. 2. Expected evolution of postoperative changes from 2014 right cerebellopontine angle resection. 3. Left mastoid effusion is new since 2014 and likely postinflammatory. Previously-seen right tympanic and mastoid effusion has resolved. Electronically Signed   By: Genevie Ann M.D.   On: 01/28/2017 11:59   US Renal  Result Date: 01/29/2017 CLINICAL DATA:  Acute onset of renal insufficiency. Initial encounter. EXAM: RENAL / URINARY TRACT ULTRASOUND COMPLETE COMPARISON:  None. FINDINGS: Right Kidney: Length: 11.3 cm. Echogenicity within normal limits. No mass or hydronephrosis visualized. Left Kidney: Length: 13.5 cm. Echogenicity within normal limits. No mass or hydronephrosis visualized. Bladder: Decompressed and not well characterized. IMPRESSION: Evaluation is somewhat suboptimal due to overlying structures. No evidence of hydronephrosis. Unremarkable renal ultrasound.  Electronically Signed   By: Garald Balding M.D.   On: 01/29/2017 00:29   Dg Chest Port 1 View  Result Date: 01/29/2017 CLINICAL DATA:  56 year old male with shortness of breath. Subsequent encounter. EXAM: PORTABLE CHEST 1 VIEW COMPARISON:  01/28/2017. FINDINGS: Post CABG.  Cardiomegaly.  Pulmonary edema. Limited for evaluating mediastinal structures or detecting underlying mass. No pneumothorax. IMPRESSION: Similar appearance of cardiomegaly and pulmonary edema. Electronically Signed   By: Genia Del M.D.   On: 01/29/2017 07:02   Dg Chest Portable 1 View  Result Date: 01/28/2017 CLINICAL DATA:  Altered mental status. EXAM: PORTABLE CHEST 1 VIEW COMPARISON:  Single-view of the chest 02/21/2016 and 11/20/2012. FINDINGS: The patient is status post CABG. There is cardiomegaly and pulmonary edema. No consolidative process,  pneumothorax or effusion is identified. No acute bony abnormality. IMPRESSION: Cardiomegaly and pulmonary edema. Electronically Signed   By: Inge Rise M.D.   On: 01/28/2017 11:11   US Abdomen Limited Ruq  Result Date: 01/29/2017 CLINICAL DATA:  Acute onset of hyperammonemia.  Initial encounter. EXAM: ULTRASOUND ABDOMEN LIMITED RIGHT UPPER QUADRANT COMPARISON:  None. FINDINGS: Gallbladder: No gallstones or wall thickening visualized. Contracted and difficult to fully characterize. No sonographic Murphy sign noted by sonographer. Common bile duct: Diameter: 0.4 cm, within normal limits in caliber. Liver: No focal lesion identified. Increased parenchymal echogenicity and coarsened echotexture likely reflects fatty infiltration. IMPRESSION: 1. No acute abnormality seen at the right upper quadrant. 2. Gallbladder contracted and difficult to fully characterize, though grossly unremarkable in appearance. 3. Diffuse fatty infiltration within the liver. Electronically Signed   By: Garald Balding M.D.   On: 01/29/2017 00:30    Time Spent in minutes  35   Louellen Molder M.D on  01/31/2017 at 11:40 AM  Between 7am to 7pm - Pager - [phone]  After 7pm go to [email or web address] - password [email or web address]  [facility] -  Office  [phone]

## 2017-02-01 DIAGNOSIS — E119 Type 2 diabetes mellitus without complications: Secondary | ICD-10-CM

## 2017-02-01 DIAGNOSIS — I5033 Acute on chronic diastolic (congestive) heart failure: Secondary | ICD-10-CM

## 2017-02-01 LAB — CBC
HEMATOCRIT: 46.4 % (ref 39.0–52.0)
HEMOGLOBIN: 13.9 g/dL (ref 13.0–17.0)
MCH: 31.5 pg (ref 26.0–34.0)
MCHC: 30 g/dL (ref 30.0–36.0)
MCV: 105.2 fL — AB (ref 78.0–100.0)
Platelets: 321 10*3/uL (ref 150–400)
RBC: 4.41 MIL/uL (ref 4.22–5.81)
RDW: 12.6 % (ref 11.5–15.5)
WBC: 14 10*3/uL — ABNORMAL HIGH (ref 4.0–10.5)

## 2017-02-01 LAB — BASIC METABOLIC PANEL
ANION GAP: 12 (ref 5–15)
BUN: 24 mg/dL — AB (ref 6–20)
CO2: 38 mmol/L — AB (ref 22–32)
Calcium: 8.6 mg/dL — ABNORMAL LOW (ref 8.9–10.3)
Chloride: 91 mmol/L — ABNORMAL LOW (ref 101–111)
Creatinine, Ser: 1.49 mg/dL — ABNORMAL HIGH (ref 0.61–1.24)
GFR calc Af Amer: 59 mL/min — ABNORMAL LOW (ref >60)
GFR calc non Af Amer: 51 mL/min — ABNORMAL LOW (ref >60)
GLUCOSE: 182 mg/dL — AB (ref 65–99)
Potassium: 2.9 mmol/L — ABNORMAL LOW (ref 3.5–5.1)
Sodium: 141 mmol/L (ref 135–145)

## 2017-02-01 LAB — HEMOGLOBIN A1C
Hgb A1c MFr Bld: 6.6 % — ABNORMAL HIGH (ref 4.8–5.6)
MEAN PLASMA GLUCOSE: 143 mg/dL

## 2017-02-01 LAB — MAGNESIUM: Magnesium: 1.8 mg/dL (ref 1.7–2.4)

## 2017-02-01 MED ORDER — POTASSIUM CHLORIDE CRYS ER 20 MEQ PO TBCR
40.0000 meq | EXTENDED_RELEASE_TABLET | Freq: Two times a day (BID) | ORAL | Status: DC
  Administered 2017-02-01 (×2): 40 meq via ORAL
  Filled 2017-02-01 (×2): qty 2

## 2017-02-01 MED ORDER — METOPROLOL TARTRATE 50 MG PO TABS
50.0000 mg | ORAL_TABLET | Freq: Two times a day (BID) | ORAL | Status: DC
  Administered 2017-02-01 – 2017-02-03 (×5): 50 mg via ORAL
  Filled 2017-02-01 (×5): qty 1

## 2017-02-01 MED ORDER — MAGNESIUM SULFATE 2 GM/50ML IV SOLN
2.0000 g | Freq: Once | INTRAVENOUS | Status: AC
  Administered 2017-02-01: 2 g via INTRAVENOUS
  Filled 2017-02-01: qty 50

## 2017-02-01 NOTE — Progress Notes (Signed)
Patient re-weighed again this morning due to discrepancy with initial weight upon transfer on 01/31/17. Most recent documented weight is correct.

## 2017-02-01 NOTE — Progress Notes (Addendum)
PROGRESS NOTE                                                                                                                                                                                                             Patient Demographics:    [name], is a 56 y.o. male, DOB - 03-Feb-1961, YWV:[id number]  Admit date - 01/28/2017   Admitting Physician Elwin Mocha, MD  Outpatient Primary MD for the patient is Leonard Downing, MD  LOS - 4  Outpatient Specialists:  Chief Complaint  Patient presents with  . Altered Mental Status       Brief Narrative  56 year old morbidly obese male with history of brain tumor, myalgia, gout, hypertension, kidney stones, history of stroke with left eye blindness, diastolic CHF, CAD with history of MI presented to the ED with unresponsiveness. No new medication changes (was started on medication for gout which he has not had taken). Patient found to have acute metabolic encephalopathy due to sepsis secondary to UTI.   Subjective:   Reports his breathing to be better today. Informs having palpitations early this morning.   Assessment  & Plan :   Principal problem Sepsis with Acute metabolic encephalopathy Possibly secondary to UTI. Both blood and urine cultures negative for growth. Continue empiric Rocephin. Also had hypercapnia on admission ABG which probably is baseline due to OHS. Somnolence and confusion  resolved.  Acute on chronic diastolic CHF Required BiPAP on admission, now transitioned to nasal cannula (liters and tolerating well).  2-D echo with EF of 55-60%, moderate LVH (poor image quality and patient refused Definity). -Currently maintaining sats on 3 L nasal cannula.  Diuresed quite well with IV Lasix, now transitioned to by mouth Cardiology consult appreciated. Have signed off. (Patient should follow-up with Dr. Gwenlyn Found as outpatient).  New onset atrial  fibrillation with RVR Required Cardizem drip on admission.Transitioned to oral Cardizem. Change atenolol to metoprolol.  Was on Lovenox prior to admission which is switched to eliquis, ( CHADS2Vasc of 6). patient on Tegretol which is discontinued due to its interaction with anticoagulation.  Hypokalemia Replenished.  Chronic hypercapnic respiratory failure Baseline PCO2 around 65. Was requiring BiPAP off-and-on. Needs outpatient sleep study.   Elevated troponin Possibly demand ischemia with acute CHF, A. fib and infection. Echo reviewed.  Acute kidney injury Secondary to sepsis and  IV diuretic. Now on oral Lasix. Monitor in a.m.  Peripheral neuropathy Was started on Tegretol recently by Dr. Tessa Lerner. Discontinued due to increased interaction with eliquis.  Chronic headache Continue Topamax.  Chronic gout Stable.   New onset diabetes mellitus. A1c of 6.6. Needs education and teaching on glucose monitoring.  Morbid obesity/OHS Outpatient sleep study.    Code Status : Full code  Family Communication  :none At bedside  Disposition Plan  : Home with home health once improved, possibly in am if diuresing further and renal function better.  Barriers For Discharge : Active symptoms.   Consults  :  Cardiology  Procedures  : 2-D echo  DVT Prophylaxis  :  eliquis  Lab Results  Component Value Date   PLT 321 02/01/2017    Antibiotics  :   Anti-infectives    Start     Dose/Rate Route Frequency Ordered Stop   01/29/17 1300  cefTRIAXone (ROCEPHIN) 1 g in dextrose 5 % 50 mL IVPB     1 g 100 mL/hr over 30 Minutes Intravenous Every 24 hours 01/28/17 1951     01/28/17 1215  cefTRIAXone (ROCEPHIN) 1 g in dextrose 5 % 50 mL IVPB     1 g 100 mL/hr over 30 Minutes Intravenous  Once 01/28/17 1200 01/28/17 1333        Objective:   Vitals:   01/31/17 1410 01/31/17 2005 02/01/17 0659 02/01/17 0824  BP:  121/61 (!) 137/55   Pulse:  94 85   Resp:  18 20   Temp:  98.4 F  (36.9 C) 98.1 F (36.7 C)   TempSrc:  Oral Oral   SpO2: 93% 94% 96%   Weight:   (!) 166.3 kg (366 lb 9.6 oz) (!) 166.3 kg (366 lb 10 oz)  Height:        Wt Readings from Last 3 Encounters:  02/01/17 (!) 166.3 kg (366 lb 10 oz)  02/23/16 (!) 157.6 kg (347 lb 8 oz)  02/11/16 (!) 166.5 kg (367 lb 1.6 oz)     Intake/Output Summary (Last 24 hours) at 02/01/17 1123 Last data filed at 02/01/17 0858  Gross per 24 hour  Intake             1320 ml  Output             2100 ml  Net             -780 ml     Physical Exam  Gen: Morbidly obese male in nAD HEENT: Left eye blind, right facial droop, moist mucosa, no JVD Chest: Diminished breath sound body habitus CVS: S1 and S2 irregular, no murmurs rub or gallop GI: soft, NT, ND,  Foley catheter with pinkish urine Musculoskeletal: warm, trace pitting edema bilaterally ( improved from yesterday) CNS: Alert and oriented    Data Review:    CBC  Recent Labs Lab 01/28/17 1102 01/28/17 1646 01/29/17 0605 01/30/17 0412 02/01/17 0143  WBC 14.4* 12.4* 12.0* 12.7* 14.0*  HGB 13.6 12.7* 13.0 12.8* 13.9  HCT 47.1 45.0 45.0 44.1 46.4  PLT 240 240 236 288 321  MCV 110.8* 111.7* 110.0* 108.1* 105.2*  MCH 32.0 31.5 31.8 31.4 31.5  MCHC 28.9* 28.2* 28.9* 29.0* 30.0  RDW 13.0 13.0 12.8 12.8 12.6  LYMPHSABS 1.3  --  1.3  --   --   MONOABS 2.2*  --  1.3*  --   --   EOSABS 0.3  --  0.2  --   --  BASOSABS 0.1  --  0.1  --   --     Chemistries   Recent Labs Lab 01/28/17 1102 01/28/17 1646 01/28/17 1900 01/29/17 0605 01/30/17 0412 01/31/17 0411 02/01/17 0143 02/01/17 0723  NA 144  --  141 145 143 140 141  --   K 3.6  --  3.5 3.3* 3.0* 3.1* 2.9*  --   CL 95*  --  89* 92* 91* 88* 91*  --   CO2 39*  --  40* 44* 42* 38* 38*  --   GLUCOSE 209*  --  188* 206* 168* 191* 182*  --   BUN 22*  --  22* 23* 24* 24* 24*  --   CREATININE 1.48* 1.45* 1.40* 1.43* 1.38* 1.50* 1.49*  --   CALCIUM 9.2  --  8.7* 9.0 8.6* 8.7* 8.6*  --   MG  --   1.9  --  1.8 1.8  --   --  1.8  AST 27  --   --   --   --   --   --   --   ALT 53  --   --   --   --   --   --   --   ALKPHOS 115  --   --   --   --   --   --   --   BILITOT 0.6  --   --   --   --   --   --   --    ------------------------------------------------------------------------------------------------------------------ No results for input(s): CHOL, HDL, LDLCALC, TRIG, CHOLHDL, LDLDIRECT in the last 72 hours.  Lab Results  Component Value Date   HGBA1C 6.6 (H) 01/31/2017   ------------------------------------------------------------------------------------------------------------------ No results for input(s): TSH, T4TOTAL, T3FREE, THYROIDAB in the last 72 hours.  Invalid input(s): FREET3 ------------------------------------------------------------------------------------------------------------------ No results for input(s): VITAMINB12, FOLATE, FERRITIN, TIBC, IRON, RETICCTPCT in the last 72 hours.  Coagulation profile  Recent Labs Lab 01/28/17 1102  INR 1.14    No results for input(s): DDIMER in the last 72 hours.  Cardiac Enzymes  Recent Labs Lab 01/28/17 1646 01/28/17 2259 01/29/17 0605  TROPONINI 0.03* 0.04* 0.03*   ------------------------------------------------------------------------------------------------------------------    Component Value Date/Time   BNP 394.6 (H) 01/28/2017 1102    Inpatient Medications  Scheduled Meds: . apixaban  5 mg Oral BID  . artificial tears  1 application Right Eye QHS  . atenolol  50 mg Oral BID  . diltiazem  120 mg Oral Daily  . furosemide  40 mg Oral BID  . isosorbide mononitrate  30 mg Oral BID  . lactulose  30 g Oral Once  . phosphorus  500 mg Oral Daily  . potassium chloride  40 mEq Oral BID  . sodium chloride flush  3 mL Intravenous Q12H  . topiramate  100 mg Oral Daily   Continuous Infusions: . sodium chloride    . cefTRIAXone (ROCEPHIN)  IV Stopped (01/31/17 1254)   PRN Meds:.sodium chloride,  acetaminophen, albuterol, hydrALAZINE, HYDROcodone-acetaminophen, ondansetron (ZOFRAN) IV, sodium chloride flush  Micro Results Recent Results (from the past 240 hour(s))  Culture, blood (Routine x 2)     Status: None (Preliminary result)   Collection Time: 01/28/17 11:00 AM  Result Value Ref Range Status   Specimen Description BLOOD LEFT HAND  Final   Special Requests IN PEDIATRIC BOTTLE Blood Culture adequate volume  Final   Culture NO GROWTH 3 DAYS  Final   Report Status PENDING  Incomplete  Culture,  blood (Routine x 2)     Status: None (Preliminary result)   Collection Time: 01/28/17 11:07 AM  Result Value Ref Range Status   Specimen Description BLOOD LEFT WRIST  Final   Special Requests IN PEDIATRIC BOTTLE Blood Culture adequate volume  Final   Culture NO GROWTH 3 DAYS  Final   Report Status PENDING  Incomplete  Urine culture     Status: Abnormal   Collection Time: 01/28/17 12:36 PM  Result Value Ref Range Status   Specimen Description URINE, RANDOM  Final   Special Requests NONE  Final   Culture MULTIPLE SPECIES PRESENT, SUGGEST RECOLLECTION (A)  Final   Report Status 01/29/2017 FINAL  Final  MRSA PCR Screening     Status: None   Collection Time: 01/28/17  5:57 PM  Result Value Ref Range Status   MRSA by PCR NEGATIVE NEGATIVE Final    Comment:        The GeneXpert MRSA Assay (FDA approved for NASAL specimens only), is one component of a comprehensive MRSA colonization surveillance program. It is not intended to diagnose MRSA infection nor to guide or monitor treatment for MRSA infections.     Radiology Reports Ct Head Wo Contrast  Result Date: 01/28/2017 CLINICAL DATA:  56 year old male with altered mental status, last seen normal 1100 hours yesterday. EXAM: CT HEAD WITHOUT CONTRAST TECHNIQUE: Contiguous axial images were obtained from the base of the skull through the vertex without intravenous contrast. COMPARISON:  Head CT without contrast 11/19/2012 and earlier.  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 Small chronic right lateral posterior fossa extra-axial hygroma has decreased since 2014. Questionable mild hypodensity in the left central cerebellum which would be new since 2014. Supratentorial gray-white matter differentiation is stable and within normal limits. No midline shift, ventriculomegaly, intracranial hemorrhage or evidence of cortically based acute infarction. Vascular: Mild Calcified atherosclerosis at the skull base. Skull: Chronic right suboccipital craniectomy changes again noted. An associated postoperative pseudomeningocele has resolved. Stable visualized osseous structures. Sinuses/Orbits: Mild mucosal thickening or small fluid level in the left maxillary sinus. Other paranasal sinuses are stable in clear. Right tympanic cavity and mastoids have cleared since 2014, but there is a new left mastoid effusion. The left tympanic cavity remains clear. Other: No acute orbit or scalp soft tissue findings. IMPRESSION: 1. Questionable hypodensity in the central left cerebellum, such as due to small vessel ischemia, which would be new since 2014, but may be artifact. 2. Expected evolution of postoperative changes from 2014 right cerebellopontine angle resection. 3. Left mastoid effusion is new since 2014 and likely postinflammatory. Previously-seen right tympanic and mastoid effusion has resolved. Electronically Signed   By: Genevie Ann M.D.   On: 01/28/2017 11:59   US Renal  Result Date: 01/29/2017 CLINICAL DATA:  Acute onset of renal insufficiency. Initial encounter. EXAM: RENAL / URINARY TRACT ULTRASOUND COMPLETE COMPARISON:  None. FINDINGS: Right Kidney: Length: 11.3 cm. Echogenicity within normal limits. No mass or hydronephrosis visualized. Left Kidney: Length: 13.5 cm. Echogenicity within normal limits. No mass or hydronephrosis visualized. Bladder: Decompressed and not well characterized. IMPRESSION: Evaluation is  somewhat suboptimal due to overlying structures. No evidence of hydronephrosis. Unremarkable renal ultrasound. Electronically Signed   By: Garald Balding M.D.   On: 01/29/2017 00:29   Dg Chest Port 1 View  Result Date: 01/29/2017 CLINICAL DATA:  56 year old male with shortness of breath. Subsequent encounter. EXAM: PORTABLE CHEST 1 VIEW COMPARISON:  01/28/2017. FINDINGS: Post CABG.  Cardiomegaly.  Pulmonary edema.  Limited for evaluating mediastinal structures or detecting underlying mass. No pneumothorax. IMPRESSION: Similar appearance of cardiomegaly and pulmonary edema. Electronically Signed   By: Genia Del M.D.   On: 01/29/2017 07:02   Dg Chest Portable 1 View  Result Date: 01/28/2017 CLINICAL DATA:  Altered mental status. EXAM: PORTABLE CHEST 1 VIEW COMPARISON:  Single-view of the chest 02/21/2016 and 11/20/2012. FINDINGS: The patient is status post CABG. There is cardiomegaly and pulmonary edema. No consolidative process, pneumothorax or effusion is identified. No acute bony abnormality. IMPRESSION: Cardiomegaly and pulmonary edema. Electronically Signed   By: Inge Rise M.D.   On: 01/28/2017 11:11   US Abdomen Limited Ruq  Result Date: 01/29/2017 CLINICAL DATA:  Acute onset of hyperammonemia.  Initial encounter. EXAM: ULTRASOUND ABDOMEN LIMITED RIGHT UPPER QUADRANT COMPARISON:  None. FINDINGS: Gallbladder: No gallstones or wall thickening visualized. Contracted and difficult to fully characterize. No sonographic Murphy sign noted by sonographer. Common bile duct: Diameter: 0.4 cm, within normal limits in caliber. Liver: No focal lesion identified. Increased parenchymal echogenicity and coarsened echotexture likely reflects fatty infiltration. IMPRESSION: 1. No acute abnormality seen at the right upper quadrant. 2. Gallbladder contracted and difficult to fully characterize, though grossly unremarkable in appearance. 3. Diffuse fatty infiltration within the liver. Electronically Signed    By: Garald Balding M.D.   On: 01/29/2017 00:30    Time Spent in minutes  25   Louellen Molder M.D on 02/01/2017 at 11:23 AM  Between 7am to 7pm - Pager - [phone]  After 7pm go to [email or web address] - password [facility]  [facility] -  Office  [phone]

## 2017-02-01 NOTE — Care Management Important Message (Signed)
Important Message  Patient Details  Name: [name]. MRN: [id number] Date of Birth: 11-21-1960   Medicare Important Message Given:  Yes    Genavie Boettger 02/01/2017, 12:03 PM

## 2017-02-02 DIAGNOSIS — R4182 Altered mental status, unspecified: Secondary | ICD-10-CM

## 2017-02-02 DIAGNOSIS — N179 Acute kidney failure, unspecified: Secondary | ICD-10-CM

## 2017-02-02 LAB — PHOSPHORUS: PHOSPHORUS: 3.9 mg/dL (ref 2.5–4.6)

## 2017-02-02 LAB — BASIC METABOLIC PANEL
Anion gap: 9 (ref 5–15)
BUN: 23 mg/dL — ABNORMAL HIGH (ref 6–20)
CALCIUM: 8.5 mg/dL — AB (ref 8.9–10.3)
CO2: 38 mmol/L — ABNORMAL HIGH (ref 22–32)
Chloride: 94 mmol/L — ABNORMAL LOW (ref 101–111)
Creatinine, Ser: 1.52 mg/dL — ABNORMAL HIGH (ref 0.61–1.24)
GFR calc non Af Amer: 50 mL/min — ABNORMAL LOW (ref >60)
GFR, EST AFRICAN AMERICAN: 58 mL/min — AB (ref >60)
Glucose, Bld: 158 mg/dL — ABNORMAL HIGH (ref 65–99)
Potassium: 3.2 mmol/L — ABNORMAL LOW (ref 3.5–5.1)
SODIUM: 141 mmol/L (ref 135–145)

## 2017-02-02 LAB — CULTURE, BLOOD (ROUTINE X 2)
Culture: NO GROWTH
Culture: NO GROWTH
SPECIAL REQUESTS: ADEQUATE
Special Requests: ADEQUATE

## 2017-02-02 MED ORDER — POTASSIUM CHLORIDE CRYS ER 20 MEQ PO TBCR
40.0000 meq | EXTENDED_RELEASE_TABLET | Freq: Three times a day (TID) | ORAL | Status: AC
  Administered 2017-02-02 (×3): 40 meq via ORAL
  Filled 2017-02-02 (×3): qty 2

## 2017-02-02 MED ORDER — POTASSIUM CHLORIDE 20 MEQ PO PACK: 40.0000 meq | PACK | Freq: Once | ORAL | Status: DC

## 2017-02-02 MED ORDER — GLIPIZIDE 2.5 MG HALF TABLET
2.5000 mg | ORAL_TABLET | Freq: Every day | ORAL | Status: DC
  Administered 2017-02-02 – 2017-02-03 (×2): 2.5 mg via ORAL
  Filled 2017-02-02 (×2): qty 1

## 2017-02-02 NOTE — Progress Notes (Signed)
Physical Therapy Treatment Patient Details Name: [name]. MRN: [id number] DOB: [birth year] Today's Date: 02/02/2017    History of Present Illness Pt adm with UTI/sepsis and acute encephalopathy. PMH - brain tumor, stroke, CAD, heart failure, morbid obesity.    PT Comments    Pt's HR elevated to 155-160 bpm with gait, so gait limited to 14' today. Con't to recommend HHPT.   Follow Up Recommendations  Home health PT;Supervision - Intermittent     Equipment Recommendations  None recommended by PT    Recommendations for Other Services       Precautions / Restrictions Precautions Precautions: Fall Restrictions Weight Bearing Restrictions: No    Mobility  Bed Mobility Overal bed mobility: Needs Assistance Bed Mobility: Supine to Sit     Supine to sit: Min assist     General bed mobility comments: Assist for pt to pull up on hand. o2 87% and worked on pursed lip breathing at EOB until it reached 91%.  Transfers Overall transfer level: Needs assistance Equipment used: 4-wheeled walker Transfers: Sit to/from Stand Sit to Stand: Min guard Stand pivot transfers: Min assist       General transfer comment: min/guard for sit to stand from air mattress and rollator.  MIN A for SPT from rollator to [facility]  Ambulation/Gait Ambulation/Gait assistance: Supervision Ambulation Distance (Feet): 75 Feet Assistive device: 4-wheeled walker (bariatric rollator) Gait Pattern/deviations: Step-through pattern;Decreased stride length;Wide base of support     General Gait Details: Ambulated on 3 L/min o2 with HR elevating to 155-160 bpm so upped o2 to 4L/min per nursing and returned to room.  o2 87% upon return to room with increase to 92% with pursed lip breathing.   Stairs            Wheelchair Mobility    Modified Rankin (Stroke Patients Only)       Balance           Standing balance support: Bilateral upper extremity supported;During functional  activity Standing balance-Leahy Scale: Poor                              Cognition Arousal/Alertness: Awake/alert Behavior During Therapy: WFL for tasks assessed/performed Overall Cognitive Status: Within Functional Limits for tasks assessed                                 General Comments: Pt with various concerns that were all addressed by PT and nursing      Exercises      General Comments        Pertinent Vitals/Pain Pain Assessment: No/denies pain    Home Living                      Prior Function            PT Goals (current goals can now be found in the care plan section) Acute Rehab PT Goals Patient Stated Goal: to go home PT Goal Formulation: With patient Time For Goal Achievement: 02/06/17 Potential to Achieve Goals: Good Progress towards PT goals: Progressing toward goals    Frequency    Min 3X/week      PT Plan Current plan remains appropriate    Co-evaluation              AM-PAC PT "6 Clicks" Daily Activity  Outcome Measure  Difficulty turning over  in bed (including adjusting bedclothes, sheets and blankets)?: Total Difficulty moving from lying on back to sitting on the side of the bed? : Total Difficulty sitting down on and standing up from a chair with arms (e.g., wheelchair, bedside commode, etc,.)?: Total Help needed moving to and from a bed to chair (including a wheelchair)?: A Little Help needed walking in hospital room?: A Little Help needed climbing 3-5 steps with a railing? : A Lot 6 Click Score: 11    End of Session Equipment Utilized During Treatment: Oxygen Activity Tolerance: Treatment limited secondary to medical complications (Comment) (tachycardia) Patient left: with call bell/phone within reach;Other (comment) (on [facility]) Nurse Communication: Mobility status PT Visit Diagnosis: Muscle weakness (generalized) (M62.81);Difficulty in walking, not elsewhere classified (R26.2)     Time:  0174-9449 PT Time Calculation (min) (ACUTE ONLY): 24 min  Charges:  $Gait Training: 8-22 mins $Therapeutic Activity: 8-22 mins                    G Codes:       Irelynd Zumstein L. Tamala Julian, [place] Pager [phone] 02/02/2017    [name] 02/02/2017, 10:25 AM

## 2017-02-02 NOTE — Progress Notes (Signed)
Pt is alert and oriented with frequent Demands at times, wants all his medication at once, wants clarification on New Medications.

## 2017-02-02 NOTE — Consult Note (Signed)
   [facility] CM Inpatient Consult   02/02/2017  Demar Shad 1961-07-18 [id number]  Patient is listed as [name].  Patient admitted with Sepsis and acute encephalopathy with HX of Brain tumor, HF and stroke.  Patient was seen by inpatient RNCM and assigned to HF EMMI calls.  Met with the patient who states he does not go to Dr. Dennard Schaumann.  He states his primary care provider is Dr. Leonard Downing a non-[facility] Care Management provider.  Patient had questions regarding benefits with [facility] PPO, gave him the customer service number for additional benefit questions. For questions, please contact:  Natividad Brood, RN BSN [facility] Liaison  [phone] business mobile phone Toll free office [phone]

## 2017-02-02 NOTE — Progress Notes (Signed)
PROGRESS NOTE                                                                                                                                                                                                             Patient Demographics:    [name], is a 56 y.o. male, DOB - 06-19-1961, SLH:[id number]  Admit date - 01/28/2017   Admitting Physician Elwin Mocha, MD  Outpatient Primary MD for the patient is Leonard Downing, MD  LOS - 5  Outpatient Specialists:  Chief Complaint  Patient presents with  . Altered Mental Status       Brief Narrative  56 year old morbidly obese male with history of brain tumor, myalgia, gout, hypertension, kidney stones, history of stroke with left eye blindness, diastolic CHF, CAD with history of MI presented to the ED with unresponsiveness. No new medication changes (was started on medication for gout which he has not had taken). Patient found to have acute metabolic encephalopathy due to sepsis secondary to UTI.   Subjective:    Continues to have a flutter but rate is controlled,no cp, no sob , thirsty, dry mouth , has concerns about hospital bill,    Assessment  & Plan :     Sepsis with Acute metabolic encephalopathy Possibly secondary to UTI. Both blood and urine cultures negative for growth. Continue empiric Rocephin for another 48 hours due to clinical improvement. Also had hypercapnia on admission ABG which probably is baseline due to OHS. Somnolence and confusion  resolved.  Acute on chronic diastolic CHF Required BiPAP on admission, now transitioned to nasal cannula (liters and tolerating well).  2-D echo with EF of 55-60%, LVH -Currently maintaining sats on 2 L.  Diuresed quite well with IV Lasix, now transitioned to by mouth Cardiology has signed off Follow-up with Dr. Gwenlyn Found as outpatient  Diuresed 11  L since admission  New onset atrial fibrillation with  RVR-controlled Required Cardizem drip on admission. . Currently on metoprolol and Cardizem Was on Lovenox prior to admission which is switched to eliquis, ( CHADS2Vasc of 6). patient on Tegretol which is discontinued due to its interaction with anticoagulation.  Hypokalemia Continues to run low, patient on K-Phos and potassium. Increase potassium to 3 times a day. Check phosphorus level  Chronic hypercapnic respiratory failure Baseline PCO2 around 65. Was requiring BiPAP off-and-on.  Needs outpatient sleep study.   Elevated troponin Possibly demand ischemia with acute CHF, A. fib and infection. Echo reviewed.  CK D stage III, baseline around 1.2, currently creatinine is 1.5 Continue cautious diuresis    Peripheral neuropathy Was started on Tegretol recently by Dr. Tessa Lerner. Discontinued due to increased interaction with eliquis.  Chronic headache Continue Topamax.  Chronic gout Stable.   New onset diabetes mellitus. A1c of 6.6.  Started low-dose glipizide  Morbid obesity/OHS Outpatient sleep study.    Code Status : Full code  Family Communication  :none At bedside  Disposition Plan  : Home with home health  1-2 days  Barriers For Discharge : Continue repletion of electrolytes monitor renal function,   Consults  :  Cardiology  Procedures  : 2-D echo  DVT Prophylaxis  :  eliquis  Lab Results  Component Value Date   PLT 321 02/01/2017    Antibiotics  :   Anti-infectives    Start     Dose/Rate Route Frequency Ordered Stop   01/29/17 1300  cefTRIAXone (ROCEPHIN) 1 g in dextrose 5 % 50 mL IVPB     1 g 100 mL/hr over 30 Minutes Intravenous Every 24 hours 01/28/17 1951     01/28/17 1215  cefTRIAXone (ROCEPHIN) 1 g in dextrose 5 % 50 mL IVPB     1 g 100 mL/hr over 30 Minutes Intravenous  Once 01/28/17 1200 01/28/17 1333        Objective:   Vitals:   02/01/17 1645 02/01/17 2100 02/02/17 0024 02/02/17 0406  BP: (!) 136/58 111/69 135/85 128/70  Pulse: 80 84  85 90  Resp:  18 18 18   Temp: 98.7 F (37.1 C) 98 F (36.7 C) 98.2 F (36.8 C) 98.4 F (36.9 C)  TempSrc: Oral Oral Oral Oral  SpO2: 98% 93% 95% 93%  Weight:    (!) 165.1 kg (364 lb)  Height:           Intake/Output Summary (Last 24 hours) at 02/02/17 0754 Last data filed at 02/02/17 0500  Gross per 24 hour  Intake             1250 ml  Output             1100 ml  Net              150 ml     Physical Exam  Gen: Morbidly obese male in nAD HEENT: Left eye blind, right facial droop, moist mucosa, no JVD Chest: Decreased breath sound body habitus CVS: S1 and S2 irregular, no murmurs rub or gallop GI: soft, NT, ND,   Musculoskeletal: warm, trace pitting edema 1+ bilaterally ( improved from yesterday) CNS: Alert and oriented    Data Review:    CBC  Recent Labs Lab 01/28/17 1102 01/28/17 1646 01/29/17 0605 01/30/17 0412 02/01/17 0143  WBC 14.4* 12.4* 12.0* 12.7* 14.0*  HGB 13.6 12.7* 13.0 12.8* 13.9  HCT 47.1 45.0 45.0 44.1 46.4  PLT 240 240 236 288 321  MCV 110.8* 111.7* 110.0* 108.1* 105.2*  MCH 32.0 31.5 31.8 31.4 31.5  MCHC 28.9* 28.2* 28.9* 29.0* 30.0  RDW 13.0 13.0 12.8 12.8 12.6  LYMPHSABS 1.3  --  1.3  --   --   MONOABS 2.2*  --  1.3*  --   --   EOSABS 0.3  --  0.2  --   --   BASOSABS 0.1  --  0.1  --   --  Chemistries   Recent Labs Lab 01/28/17 1102 01/28/17 1646  01/29/17 0605 01/30/17 0412 01/31/17 0411 02/01/17 0143 02/01/17 0723 02/02/17 0353  NA 144  --   < > 145 143 140 141  --  141  K 3.6  --   < > 3.3* 3.0* 3.1* 2.9*  --  3.2*  CL 95*  --   < > 92* 91* 88* 91*  --  94*  CO2 39*  --   < > 44* 42* 38* 38*  --  38*  GLUCOSE 209*  --   < > 206* 168* 191* 182*  --  158*  BUN 22*  --   < > 23* 24* 24* 24*  --  23*  CREATININE 1.48* 1.45*  < > 1.43* 1.38* 1.50* 1.49*  --  1.52*  CALCIUM 9.2  --   < > 9.0 8.6* 8.7* 8.6*  --  8.5*  MG  --  1.9  --  1.8 1.8  --   --  1.8  --   AST 27  --   --   --   --   --   --   --   --   ALT 53   --   --   --   --   --   --   --   --   ALKPHOS 115  --   --   --   --   --   --   --   --   BILITOT 0.6  --   --   --   --   --   --   --   --   < > = values in this interval not displayed. ------------------------------------------------------------------------------------------------------------------ No results for input(s): CHOL, HDL, LDLCALC, TRIG, CHOLHDL, LDLDIRECT in the last 72 hours.  Lab Results  Component Value Date   HGBA1C 6.6 (H) 01/31/2017   ------------------------------------------------------------------------------------------------------------------ No results for input(s): TSH, T4TOTAL, T3FREE, THYROIDAB in the last 72 hours.  Invalid input(s): FREET3 ------------------------------------------------------------------------------------------------------------------ No results for input(s): VITAMINB12, FOLATE, FERRITIN, TIBC, IRON, RETICCTPCT in the last 72 hours.  Coagulation profile  Recent Labs Lab 01/28/17 1102  INR 1.14    No results for input(s): DDIMER in the last 72 hours.  Cardiac Enzymes  Recent Labs Lab 01/28/17 1646 01/28/17 2259 01/29/17 0605  TROPONINI 0.03* 0.04* 0.03*   ------------------------------------------------------------------------------------------------------------------    Component Value Date/Time   BNP 394.6 (H) 01/28/2017 1102    Inpatient Medications  Scheduled Meds: . apixaban  5 mg Oral BID  . artificial tears  1 application Right Eye QHS  . diltiazem  120 mg Oral Daily  . furosemide  40 mg Oral BID  . isosorbide mononitrate  30 mg Oral BID  . lactulose  30 g Oral Once  . metoprolol tartrate  50 mg Oral BID  . phosphorus  500 mg Oral Daily  . potassium chloride  40 mEq Oral BID  . sodium chloride flush  3 mL Intravenous Q12H  . topiramate  100 mg Oral Daily   Continuous Infusions: . sodium chloride    . cefTRIAXone (ROCEPHIN)  IV Stopped (02/01/17 1448)   PRN Meds:.sodium chloride, acetaminophen,  albuterol, hydrALAZINE, HYDROcodone-acetaminophen, ondansetron (ZOFRAN) IV, sodium chloride flush  Micro Results Recent Results (from the past 240 hour(s))  Culture, blood (Routine x 2)     Status: None (Preliminary result)   Collection Time: 01/28/17 11:00 AM  Result Value Ref Range Status   Specimen Description BLOOD LEFT  HAND  Final   Special Requests IN PEDIATRIC BOTTLE Blood Culture adequate volume  Final   Culture NO GROWTH 4 DAYS  Final   Report Status PENDING  Incomplete  Culture, blood (Routine x 2)     Status: None (Preliminary result)   Collection Time: 01/28/17 11:07 AM  Result Value Ref Range Status   Specimen Description BLOOD LEFT WRIST  Final   Special Requests IN PEDIATRIC BOTTLE Blood Culture adequate volume  Final   Culture NO GROWTH 4 DAYS  Final   Report Status PENDING  Incomplete  Urine culture     Status: Abnormal   Collection Time: 01/28/17 12:36 PM  Result Value Ref Range Status   Specimen Description URINE, RANDOM  Final   Special Requests NONE  Final   Culture MULTIPLE SPECIES PRESENT, SUGGEST RECOLLECTION (A)  Final   Report Status 01/29/2017 FINAL  Final  MRSA PCR Screening     Status: None   Collection Time: 01/28/17  5:57 PM  Result Value Ref Range Status   MRSA by PCR NEGATIVE NEGATIVE Final    Comment:        The GeneXpert MRSA Assay (FDA approved for NASAL specimens only), is one component of a comprehensive MRSA colonization surveillance program. It is not intended to diagnose MRSA infection nor to guide or monitor treatment for MRSA infections.     Radiology Reports Ct Head Wo Contrast  Result Date: 01/28/2017 CLINICAL DATA:  56 year old male with altered mental status, last seen normal 1100 hours yesterday. EXAM: CT HEAD WITHOUT CONTRAST TECHNIQUE: Contiguous axial images were obtained from the base of the skull through the vertex without intravenous contrast. COMPARISON:  Head CT without contrast 11/19/2012 and earlier. 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 Small chronic right lateral posterior fossa extra-axial hygroma has decreased since 2014. Questionable mild hypodensity in the left central cerebellum which would be new since 2014. Supratentorial gray-white matter differentiation is stable and within normal limits. No midline shift, ventriculomegaly, intracranial hemorrhage or evidence of cortically based acute infarction. Vascular: Mild Calcified atherosclerosis at the skull base. Skull: Chronic right suboccipital craniectomy changes again noted. An associated postoperative pseudomeningocele has resolved. Stable visualized osseous structures. Sinuses/Orbits: Mild mucosal thickening or small fluid level in the left maxillary sinus. Other paranasal sinuses are stable in clear. Right tympanic cavity and mastoids have cleared since 2014, but there is a new left mastoid effusion. The left tympanic cavity remains clear. Other: No acute orbit or scalp soft tissue findings. IMPRESSION: 1. Questionable hypodensity in the central left cerebellum, such as due to small vessel ischemia, which would be new since 2014, but may be artifact. 2. Expected evolution of postoperative changes from 2014 right cerebellopontine angle resection. 3. Left mastoid effusion is new since 2014 and likely postinflammatory. Previously-seen right tympanic and mastoid effusion has resolved. Electronically Signed   By: Genevie Ann M.D.   On: 01/28/2017 11:59   US Renal  Result Date: 01/29/2017 CLINICAL DATA:  Acute onset of renal insufficiency. Initial encounter. EXAM: RENAL / URINARY TRACT ULTRASOUND COMPLETE COMPARISON:  None. FINDINGS: Right Kidney: Length: 11.3 cm. Echogenicity within normal limits. No mass or hydronephrosis visualized. Left Kidney: Length: 13.5 cm. Echogenicity within normal limits. No mass or hydronephrosis visualized. Bladder: Decompressed and not well characterized. IMPRESSION: Evaluation is somewhat  suboptimal due to overlying structures. No evidence of hydronephrosis. Unremarkable renal ultrasound. Electronically Signed   By: Garald Balding M.D.   On: 01/29/2017 00:29   Dg Chest  Port 1 View  Result Date: 01/29/2017 CLINICAL DATA:  56 year old male with shortness of breath. Subsequent encounter. EXAM: PORTABLE CHEST 1 VIEW COMPARISON:  01/28/2017. FINDINGS: Post CABG.  Cardiomegaly.  Pulmonary edema. Limited for evaluating mediastinal structures or detecting underlying mass. No pneumothorax. IMPRESSION: Similar appearance of cardiomegaly and pulmonary edema. Electronically Signed   By: Genia Del M.D.   On: 01/29/2017 07:02   Dg Chest Portable 1 View  Result Date: 01/28/2017 CLINICAL DATA:  Altered mental status. EXAM: PORTABLE CHEST 1 VIEW COMPARISON:  Single-view of the chest 02/21/2016 and 11/20/2012. FINDINGS: The patient is status post CABG. There is cardiomegaly and pulmonary edema. No consolidative process, pneumothorax or effusion is identified. No acute bony abnormality. IMPRESSION: Cardiomegaly and pulmonary edema. Electronically Signed   By: Inge Rise M.D.   On: 01/28/2017 11:11   US Abdomen Limited Ruq  Result Date: 01/29/2017 CLINICAL DATA:  Acute onset of hyperammonemia.  Initial encounter. EXAM: ULTRASOUND ABDOMEN LIMITED RIGHT UPPER QUADRANT COMPARISON:  None. FINDINGS: Gallbladder: No gallstones or wall thickening visualized. Contracted and difficult to fully characterize. No sonographic Murphy sign noted by sonographer. Common bile duct: Diameter: 0.4 cm, within normal limits in caliber. Liver: No focal lesion identified. Increased parenchymal echogenicity and coarsened echotexture likely reflects fatty infiltration. IMPRESSION: 1. No acute abnormality seen at the right upper quadrant. 2. Gallbladder contracted and difficult to fully characterize, though grossly unremarkable in appearance. 3. Diffuse fatty infiltration within the liver. Electronically Signed   By: Garald Balding M.D.   On: 01/29/2017 00:30    Time Spent in minutes  25   Reyne Dumas M.D on 02/02/2017 at 7:54 AM  Between 7am to 7pm - Pager - [phone]  After 7pm go to [email or web address] - password [email or web address]  [facility] -  Office  [phone]

## 2017-02-03 DIAGNOSIS — R400 Somnolence: Secondary | ICD-10-CM

## 2017-02-03 LAB — COMPREHENSIVE METABOLIC PANEL
ALBUMIN: 2.8 g/dL — AB (ref 3.5–5.0)
ALT: 25 U/L (ref 17–63)
AST: 22 U/L (ref 15–41)
Alkaline Phosphatase: 86 U/L (ref 38–126)
Anion gap: 9 (ref 5–15)
BILIRUBIN TOTAL: 0.4 mg/dL (ref 0.3–1.2)
BUN: 20 mg/dL (ref 6–20)
CO2: 35 mmol/L — ABNORMAL HIGH (ref 22–32)
Calcium: 8.5 mg/dL — ABNORMAL LOW (ref 8.9–10.3)
Chloride: 96 mmol/L — ABNORMAL LOW (ref 101–111)
Creatinine, Ser: 1.4 mg/dL — ABNORMAL HIGH (ref 0.61–1.24)
GFR calc Af Amer: >60 mL/min (ref >60)
GFR calc non Af Amer: 55 mL/min — ABNORMAL LOW (ref >60)
GLUCOSE: 156 mg/dL — AB (ref 65–99)
POTASSIUM: 4 mmol/L (ref 3.5–5.1)
Sodium: 140 mmol/L (ref 135–145)
TOTAL PROTEIN: 7.2 g/dL (ref 6.5–8.1)

## 2017-02-03 LAB — CBC
HEMATOCRIT: 46.3 % (ref 39.0–52.0)
Hemoglobin: 13.7 g/dL (ref 13.0–17.0)
MCH: 31.3 pg (ref 26.0–34.0)
MCHC: 29.6 g/dL — AB (ref 30.0–36.0)
MCV: 105.7 fL — AB (ref 78.0–100.0)
Platelets: 274 10*3/uL (ref 150–400)
RBC: 4.38 MIL/uL (ref 4.22–5.81)
RDW: 12.7 % (ref 11.5–15.5)
WBC: 12.2 10*3/uL — ABNORMAL HIGH (ref 4.0–10.5)

## 2017-02-03 MED ORDER — METOPROLOL TARTRATE 50 MG PO TABS: 50.0000 mg | ORAL_TABLET | Freq: Two times a day (BID) | ORAL | 0 refills | Status: DC

## 2017-02-03 MED ORDER — APIXABAN 5 MG PO TABS: 5.0000 mg | ORAL_TABLET | Freq: Two times a day (BID) | ORAL | 1 refills | Status: DC

## 2017-02-03 MED ORDER — CEFPODOXIME PROXETIL 200 MG PO TABS: 200.0000 mg | ORAL_TABLET | Freq: Two times a day (BID) | ORAL | 0 refills | Status: AC

## 2017-02-03 MED ORDER — DILTIAZEM HCL ER COATED BEADS 180 MG PO CP24: 180.0000 mg | ORAL_CAPSULE | Freq: Every day | ORAL | 1 refills | Status: DC

## 2017-02-03 MED ORDER — FUROSEMIDE 40 MG PO TABS: 40.0000 mg | ORAL_TABLET | Freq: Two times a day (BID) | ORAL | 1 refills | Status: DC

## 2017-02-03 MED ORDER — GLIPIZIDE 5 MG PO TABS: 2.5000 mg | ORAL_TABLET | Freq: Every day | ORAL | 1 refills | Status: DC

## 2017-02-03 NOTE — Discharge Summary (Addendum)
Physician Discharge Summary  [name]. MRN: [id number] DOB/AGE: [birth year] 56 y.o.  PCP: Leonard Downing, MD   Admit date: 01/28/2017 Discharge date: 02/03/2017  Discharge Diagnoses:    Principal Problem:   Acute encephalopathy Active Problems:   Irritation of right eye   Acute CHF ([place])   SIRS (systemic inflammatory response syndrome) (H[facility])   UTI (urinary tract infection)   Pressure injury of skin   AKI (acute kidney injury) ([place])   Altered mental status    Follow-up recommendations Follow-up with PCP in 3-5 days , including all  additional recommended appointments as below Follow-up CBC, CMP in 3-5 days Follow-up with cardiology Dr. Gwenlyn Found      Current Discharge Medication List    START taking these medications   Details  apixaban (ELIQUIS) 5 MG TABS tablet Take 1 tablet (5 mg total) by mouth 2 (two) times daily. Qty: 60 tablet, Refills: 1    cefpodoxime (VANTIN) 200 MG tablet Take 1 tablet (200 mg total) by mouth 2 (two) times daily. Qty: 6 tablet, Refills: 0    diltiazem (CARDIZEM CD) 180 MG 24 hr capsule Take 1 capsule (180 mg total) by mouth daily. Qty: 30 capsule, Refills: 1    glipiZIDE (GLUCOTROL) 5 MG tablet Take 0.5 tablets (2.5 mg total) by mouth daily before breakfast. Qty: 30 tablet, Refills: 1      CONTINUE these medications which have CHANGED   Details  furosemide (LASIX) 40 MG tablet Take 1 tablet (40 mg total) by mouth 2 (two) times daily. Qty: 60 tablet, Refills: 1      CONTINUE these medications which have NOT CHANGED   Details  artificial tears (LACRILUBE) OINT ophthalmic ointment Place 1 application into the right eye at bedtime.     atenolol (TENORMIN) 50 MG tablet Take 1 tablet (50 mg total) by mouth 2 (two) times daily. Note increased dose. Qty: 60 tablet, Refills: 1    HYDROcodone-acetaminophen (NORCO) 10-325 MG tablet TAKE 1 TABLET BY MOUTH EVERY 6 HOURS AS NEEDED FOR PAIN MUST LAST 30 DAYS Qty: 120 tablet,  Refills: 0   Associated Diagnoses: Status post craniotomy; Acoustic neuroma ([place]); Facial nerve palsy    nystatin (NYSTATIN) powder Apply 1 g topically 2 (two) times daily as needed. 90 day supply Qty: 180 g, Refills: 3   Associated Diagnoses: Morbid obesity due to excess calories ([place]); Fungus infection    Skin Protectants, Misc. (EUCERIN) cream Apply 1 application topically 3 (three) times daily.    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 Qty: 3.5 g, Refills: 0    topiramate (TOPAMAX) 100 MG tablet Take 1 tablet (100 mg total) by mouth daily. 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 Qty: 1 Inhaler, Refills: 1    antiseptic oral rinse (BIOTENE) LIQD 15 mLs by Mouth Rinse route as needed for dry mouth.     benzocaine (ORAJEL) 10 % mucosal  gel Use as directed 1 application in the mouth or throat 2 (two) times daily as needed for pain.    carbamazepine (TEGRETOL) 200 MG tablet Take 1 tablet (200 mg total) by mouth 2 (two) times daily. 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 Qty: 30 tablet, Refills: 1    isosorbide mononitrate (IMDUR) 30 MG 24 hr tablet Take 30 mg by mouth 2 (two) times daily.    sucralfate (CARAFATE) 1 g tablet Take 1 tablet (1 g total) by mouth 4 (four) times daily -  with meals and at bedtime. Qty: 30 tablet, Refills: 0         Discharge Condition:  Prognosis guarded due to multiple comorbidities  Discharge Instructions Get Medicines reviewed and adjusted: Please take all your medications with you for your next visit with your Primary MD  Please request your Primary MD to go over all hospital tests and procedure/radiological results at the follow up, please ask your Primary MD to get all Hospital records sent to his/her office.  If you experience worsening of your admission symptoms, develop shortness of breath, life threatening emergency, suicidal or homicidal thoughts you must seek medical attention immediately by calling 911 or calling your MD immediately if symptoms less severe.  You must read complete instructions/literature along with all the possible adverse reactions/side effects for all the Medicines you take and that have been prescribed to you. Take any new Medicines after you have completely understood and accpet all the possible adverse reactions/side effects.   Do not drive when taking Pain medications.   Do not take more than prescribed Pain, Sleep and Anxiety Medications  Special Instructions: If you have smoked or chewed Tobacco in the last 2 yrs please stop smoking, stop any regular Alcohol and or any Recreational drug use.  Wear Seat belts while driving.  Please note  You were cared for by a hospitalist during your hospital stay. Once you are discharged, your primary care physician will handle any further medical issues. Please note that NO REFILLS for any discharge medications will be  authorized once you are discharged, as it is imperative that you return to your primary care physician (or establish a relationship with a primary care physician if you do not have one) for your aftercare needs so that they can reassess your need for medications and monitor your lab values.     Allergies  Allergen Reactions  . Morphine And Related Anaphylaxis    "makes me stop breathing"  . Ivp Dye [Iodinated Diagnostic Agents]     Passing out  . Other     Steroids makes hearts race      Disposition: Home with home health   Consults:  Cardiology    Significant Diagnostic Studies:  Ct Head Wo Contrast  Result Date: 01/28/2017 CLINICAL DATA:  56 year old male with altered mental status, last seen normal 1100 hours yesterday. EXAM: CT HEAD WITHOUT CONTRAST TECHNIQUE: Contiguous axial images were obtained from the base of the skull through the vertex without intravenous contrast. COMPARISON:  Head CT without contrast 11/19/2012 and earlier. FINDINGS: Brain: Chronic encephalomalacia along the right cerebellopontine  angle. Small chronic right lateral posterior fossa extra-axial hygroma has decreased since 2014. Questionable mild hypodensity in the left central cerebellum which would be new since 2014. Supratentorial gray-white matter differentiation is stable and within normal limits. No midline shift, ventriculomegaly, intracranial hemorrhage or evidence of cortically based acute infarction. Vascular: Mild Calcified atherosclerosis at the skull base. Skull: Chronic right suboccipital craniectomy changes again noted. An associated postoperative pseudomeningocele has resolved. Stable visualized osseous structures. Sinuses/Orbits: Mild mucosal thickening or small fluid level in the left maxillary sinus. Other paranasal sinuses are stable in clear. Right tympanic cavity and mastoids have cleared since 2014, but there is a new left mastoid effusion. The left tympanic cavity remains clear. Other: No  acute orbit or scalp soft tissue findings. IMPRESSION: 1. Questionable hypodensity in the central left cerebellum, such as due to small vessel ischemia, which would be new since 2014, but may be artifact. 2. Expected evolution of postoperative changes from 2014 right cerebellopontine angle resection. 3. Left mastoid effusion is new since 2014 and likely postinflammatory. Previously-seen right tympanic and mastoid effusion has resolved. Electronically Signed   By: Genevie Ann M.D.   On: 01/28/2017 11:59   US Renal  Result Date: 01/29/2017 CLINICAL DATA:  Acute onset of renal insufficiency. Initial encounter. EXAM: RENAL / URINARY TRACT ULTRASOUND COMPLETE COMPARISON:  None. FINDINGS: Right Kidney: Length: 11.3 cm. Echogenicity within normal limits. No mass or hydronephrosis visualized. Left Kidney: Length: 13.5 cm. Echogenicity within normal limits. No mass or hydronephrosis visualized. Bladder: Decompressed and not well characterized. IMPRESSION: Evaluation is somewhat suboptimal due to overlying structures. No evidence of hydronephrosis. Unremarkable renal ultrasound. Electronically Signed   By: Garald Balding M.D.   On: 01/29/2017 00:29   Dg Chest Port 1 View  Result Date: 01/29/2017 CLINICAL DATA:  56 year old male with shortness of breath. Subsequent encounter. EXAM: PORTABLE CHEST 1 VIEW COMPARISON:  01/28/2017. FINDINGS: Post CABG.  Cardiomegaly.  Pulmonary edema. Limited for evaluating mediastinal structures or detecting underlying mass. No pneumothorax. IMPRESSION: Similar appearance of cardiomegaly and pulmonary edema. Electronically Signed   By: Genia Del M.D.   On: 01/29/2017 07:02   Dg Chest Portable 1 View  Result Date: 01/28/2017 CLINICAL DATA:  Altered mental status. EXAM: PORTABLE CHEST 1 VIEW COMPARISON:  Single-view of the chest 02/21/2016 and 11/20/2012. FINDINGS: The patient is status post CABG. There is cardiomegaly and pulmonary edema. No consolidative process, pneumothorax or  effusion is identified. No acute bony abnormality. IMPRESSION: Cardiomegaly and pulmonary edema. Electronically Signed   By: Inge Rise M.D.   On: 01/28/2017 11:11   US Abdomen Limited Ruq  Result Date: 01/29/2017 CLINICAL DATA:  Acute onset of hyperammonemia.  Initial encounter. EXAM: ULTRASOUND ABDOMEN LIMITED RIGHT UPPER QUADRANT COMPARISON:  None. FINDINGS: Gallbladder: No gallstones or wall thickening visualized. Contracted and difficult to fully characterize. No sonographic Murphy sign noted by sonographer. Common bile duct: Diameter: 0.4 cm, within normal limits in caliber. Liver: No focal lesion identified. Increased parenchymal echogenicity and coarsened echotexture likely reflects fatty infiltration. IMPRESSION: 1. No acute abnormality seen at the right upper quadrant. 2. Gallbladder contracted and difficult to fully characterize, though grossly unremarkable in appearance. 3. Diffuse fatty infiltration within the liver. Electronically Signed   By: Garald Balding M.D.   On: 01/29/2017 00:30       Filed Weights   02/01/17 0824 02/02/17 0406 02/03/17 0613  Weight: (!) 166.3 kg (366 lb 10 oz) (!) 165.1 kg (364 lb) (!) 167.9 kg (370 lb 1.6 oz)  Microbiology: Recent Results (from the past 240 hour(s))  Culture, blood (Routine x 2)     Status: None   Collection Time: 01/28/17 11:00 AM  Result Value Ref Range Status   Specimen Description BLOOD LEFT HAND  Final   Special Requests IN PEDIATRIC BOTTLE Blood Culture adequate volume  Final   Culture NO GROWTH 5 DAYS  Final   Report Status 02/02/2017 FINAL  Final  Culture, blood (Routine x 2)     Status: None   Collection Time: 01/28/17 11:07 AM  Result Value Ref Range Status   Specimen Description BLOOD LEFT WRIST  Final   Special Requests IN PEDIATRIC BOTTLE Blood Culture adequate volume  Final   Culture NO GROWTH 5 DAYS  Final   Report Status 02/02/2017 FINAL  Final  Urine culture     Status: Abnormal   Collection Time:  01/28/17 12:36 PM  Result Value Ref Range Status   Specimen Description URINE, RANDOM  Final   Special Requests NONE  Final   Culture MULTIPLE SPECIES PRESENT, SUGGEST RECOLLECTION (A)  Final   Report Status 01/29/2017 FINAL  Final  MRSA PCR Screening     Status: None   Collection Time: 01/28/17  5:57 PM  Result Value Ref Range Status   MRSA by PCR NEGATIVE NEGATIVE Final    Comment:        The GeneXpert MRSA Assay (FDA approved for NASAL specimens only), is one component of a comprehensive MRSA colonization surveillance program. It is not intended to diagnose MRSA infection nor to guide or monitor treatment for MRSA infections.        Blood Culture    Component Value Date/Time   SDES URINE, RANDOM 01/28/2017 1236   SPECREQUEST NONE 01/28/2017 1236   CULT MULTIPLE SPECIES PRESENT, SUGGEST RECOLLECTION (A) 01/28/2017 1236   REPTSTATUS 01/29/2017 FINAL 01/28/2017 1236      Labs: Results for orders placed or performed during the hospital encounter of 01/28/17 (from the past 48 hour(s))  Basic metabolic panel     Status: Abnormal   Collection Time: 02/02/17  3:53 AM  Result Value Ref Range   Sodium 141 135 - 145 mmol/L   Potassium 3.2 (L) 3.5 - 5.1 mmol/L   Chloride 94 (L) 101 - 111 mmol/L   CO2 38 (H) 22 - 32 mmol/L   Glucose, Bld 158 (H) 65 - 99 mg/dL   BUN 23 (H) 6 - 20 mg/dL   Creatinine, Ser 1.52 (H) 0.61 - 1.24 mg/dL   Calcium 8.5 (L) 8.9 - 10.3 mg/dL   GFR calc non Af Amer 50 (L) >60 mL/min   GFR calc Af Amer 58 (L) >60 mL/min    Comment: (NOTE) The eGFR has been calculated using the CKD EPI equation. This calculation has not been validated in all clinical situations. eGFR's persistently <60 mL/min signify possible Chronic Kidney Disease.    Anion gap 9 5 - 15  Phosphorus     Status: None   Collection Time: 02/02/17  8:28 AM  Result Value Ref Range   Phosphorus 3.9 2.5 - 4.6 mg/dL  Comprehensive metabolic panel     Status: Abnormal   Collection Time:  02/03/17  4:09 AM  Result Value Ref Range   Sodium 140 135 - 145 mmol/L   Potassium 4.0 3.5 - 5.1 mmol/L    Comment: DELTA CHECK NOTED   Chloride 96 (L) 101 - 111 mmol/L   CO2 35 (H) 22 - 32 mmol/L   Glucose, Bld  156 (H) 65 - 99 mg/dL   BUN 20 6 - 20 mg/dL   Creatinine, Ser 1.40 (H) 0.61 - 1.24 mg/dL   Calcium 8.5 (L) 8.9 - 10.3 mg/dL   Total Protein 7.2 6.5 - 8.1 g/dL   Albumin 2.8 (L) 3.5 - 5.0 g/dL   AST 22 15 - 41 U/L   ALT 25 17 - 63 U/L   Alkaline Phosphatase 86 38 - 126 U/L   Total Bilirubin 0.4 0.3 - 1.2 mg/dL   GFR calc non Af Amer 55 (L) >60 mL/min   GFR calc Af Amer >60 >60 mL/min    Comment: (NOTE) The eGFR has been calculated using the CKD EPI equation. This calculation has not been validated in all clinical situations. eGFR's persistently <60 mL/min signify possible Chronic Kidney Disease.    Anion gap 9 5 - 15  CBC     Status: Abnormal   Collection Time: 02/03/17  4:09 AM  Result Value Ref Range   WBC 12.2 (H) 4.0 - 10.5 K/uL   RBC 4.38 4.22 - 5.81 MIL/uL   Hemoglobin 13.7 13.0 - 17.0 g/dL   HCT 46.3 39.0 - 52.0 %   MCV 105.7 (H) 78.0 - 100.0 fL   MCH 31.3 26.0 - 34.0 pg   MCHC 29.6 (L) 30.0 - 36.0 g/dL   RDW 12.7 11.5 - 15.5 %   Platelets 274 150 - 400 K/uL     Lipid Panel     Component Value Date/Time   CHOL 156 02/22/2016 0342   TRIG 129 02/22/2016 0342   HDL 31 (L) 02/22/2016 0342   CHOLHDL 5.0 02/22/2016 0342   VLDL 26 02/22/2016 0342   LDLCALC 99 02/22/2016 0342     Lab Results  Component Value Date   HGBA1C 6.6 (H) 01/31/2017   HGBA1C 5.6 02/22/2016   HGBA1C  12/11/2007    5.9 (NOTE)   The ADA recommends the following therapeutic goals for glycemic   control related to Hgb A1C measurement:   Goal of Therapy:   < 7.0% Hgb A1C   Action Suggested:  > 8.0% Hgb A1C   Ref:  Diabetes Care, 22, Suppl. 1, 1999     Lab Results  Component Value Date   LDLCALC 99 02/22/2016   CREATININE 1.40 (H) 02/03/2017     HPI :*  [name]. is a 56 y.o.morbidly obese male with a complicated PMH. He has hx of CAD s/p bypass graft of LIMA to his LAD by Dr. Nils Pyle in 2009. Normal nuc stress test 2017 (EF 45-54%).  Transthoracic echo 12/2015  (EF 55-50%) no regional wall motion abnormality or significant valvular dysfunction. He has facial palsy (R) with prior stroke and significant cranial surgery for brain tumor in 2014.  Hx of smoking on 3 liters O2 at home. Walks with a rolling walker, denies having falls. Hypertension, combined systolic and diastiolic CHF, diabetes, ischemic cardiomyopathy  who was admitted after his wife was unable to wake him up. He has chronic motor neurological changes right sided weakness and facial palsy. He met SIRS criteria due to UTI and acute exacerbation of his combined systolic and diastolic heart failure.subsequently after admission developed atrial fibrillation with RVR greater than 100 for 30 minutes and started on Cardizem drip  HOSPITAL COURSE:   Sepsis with Acute metabolic encephalopathy, resolved  Possibly secondary to UTI. Both blood and urine cultures negative for growth. Due to symptoms consistent with UTI related encephalopathy, patient was Treated with  Rocephin and subsequently changed to cefpodoxime for 3 more days Also had hypercapnia on admission ABG which probably is baseline due to OHS. Somnolence and confusion  resolved.  Acute on chronic diastolic CHF Required BiPAP on admission, now transitioned to nasal cannula (liters and tolerating well).  2-D echo with EF of 55-60%, LVH -Currently maintaining sats on 2 L.  Diuresed quite well with IV Lasix, now transitioned to by mouth Cardiology has signed off Follow-up with Dr. Gwenlyn Found as outpatient  Diuresed 11  L since admission  New onset atrial fibrillation with RVR-controlled Required Cardizem drip on admission. . Currently on atenolol and Cardizem Was on Lovenox prior to admission which is switched to eliquis, ( CHADS2Vasc of 6).  patient on Tegretol which is discontinued due to its interaction with anticoagulation.  Hypokalemia/hypophosphatemia Continues to run low, patient on K-Phos and potassium. Increase potassium to 3 times a day. Check phosphorus level as outpatient  Chronic hypercapnic respiratory failure Baseline PCO2 around 65. Was requiring BiPAP off-and-on. Needs outpatient sleep study to determine if he needs nocturnal BiPAP.   Elevated troponin Possibly demand ischemia with acute CHF, A. fib and infection. Echo difficult due to size of the patient.  CK D stage III, baseline around 1.2, currently creatinine is 1.5 Continue cautious diuresis    Peripheral neuropathy Was started on Tegretol recently by Dr. Tessa Lerner. Discontinued due to increased interaction with eliquis.  Chronic headache Continue Topamax.  Chronic gout Stable.   New onset diabetes mellitus. A1c of 6.6.  Started low-dose glipizide   Morbid obesity/OHS Strongly recommend Outpatient sleep study.   Discharge Exam:   Blood pressure 130/68, pulse 100, temperature 98.6 F (37 C), temperature source Oral, resp. rate 17, height 5' 11"  (1.803 m), weight (!) 167.9 kg (370 lb 1.6 oz), SpO2 97 %.   Gen: Morbidly obese male in nAD HEENT: Left eye blind, right facial droop, moist mucosa, no JVD Chest: Decreased breath sound body habitus CVS: S1 and S2 irregular, no murmurs rub or gallop GI: soft, NT, ND,   Musculoskeletal: warm, trace pitting edema 1+ bilaterally ( improved from yesterday) CNS: Alert and oriented   Follow-up Information    Leonard Downing, MD. Call.   Specialty:  Family Medicine Why:  For appointment hospital follow-up in 3-5 days, check CBC, CMP, phosphorus Contact information: [place] [address] [phone]        Lorretta Harp, MD. Call.   Specialties:  Cardiology, Radiology Why:  For hospital follow-up in one to 2 weeks Contact information: [address] [place] [address] [phone]           Signed: Reyne Dumas 02/03/2017, 7:40 AM        Time spent >1 hour

## 2017-02-03 NOTE — Progress Notes (Signed)
CM called [facility] rep, Jermaine to please deliver a loaner tank to room for pt to use for ride home.  CM gave pt Eliquis Card and family verbalized understanding this free 30 day trial card will cover the cost of today's prescription and insurance will have authorized for refills. Pt still refusing all [place] services.

## 2017-02-03 NOTE — Progress Notes (Signed)
Patient is discharge to home accompanied by patient's spouse and NT via wheelchair. Discharge instructions given . Patient verbalizes understanding. All personal belongings given.Portable  O2 at  3L via nasal cannula in place from [place].Telemetry box and IV removed prior to discharge and site in good condition.

## 2017-02-03 NOTE — Plan of Care (Signed)
Problem: Food- and Nutrition-Related Knowledge Deficit (NB-1.1) Goal: Nutrition education Formal process to instruct or train a patient/client in a skill or to impart knowledge to help patients/clients voluntarily manage or modify food choices and eating behavior to maintain or improve health. Outcome: Completed/Met Date Met: 02/03/17  RD consulted for nutrition education regarding diabetes.   Lab Results  Component Value Date   HGBA1C 6.6 (H) 01/31/2017    RD provided "Carbohydrate Counting for People with Diabetes" handout from the Academy of Nutrition and Dietetics. Discussed different food groups and their effects on blood sugar, emphasizing carbohydrate-containing foods. Provided list of carbohydrates and recommended serving sizes of common foods.  Discussed importance of controlled and consistent carbohydrate intake throughout the day. Provided examples of ways to balance meals/snacks and encouraged intake of high-fiber, whole grain complex carbohydrates. Discussed diabetic friendly drink options. Additionally discussed a low sodium diet. Teach back method used.  Expect fair compliance.  Body mass index is 51.62 kg/m. Pt meets criteria for morbid obesity based on current BMI.  Current diet order is heart, patient is consuming approximately 100% of meals at this time. Labs and medications reviewed. No further nutrition interventions warranted at this time. Plans for discharge today.  Corrin Parker, MS, RD, LDN Pager # [phone] After hours/ weekend pager # [phone]

## 2017-02-05 ENCOUNTER — Other Ambulatory Visit: Payer: Self-pay | Admitting: Registered Nurse

## 2017-02-05 ENCOUNTER — Other Ambulatory Visit: Payer: Self-pay | Admitting: Physical Medicine & Rehabilitation

## 2017-02-05 DIAGNOSIS — G939 Disorder of brain, unspecified: Secondary | ICD-10-CM

## 2017-02-05 DIAGNOSIS — Z9889 Other specified postprocedural states: Secondary | ICD-10-CM

## 2017-02-05 DIAGNOSIS — R51 Headache: Secondary | ICD-10-CM

## 2017-02-05 DIAGNOSIS — B49 Unspecified mycosis: Secondary | ICD-10-CM

## 2017-02-05 DIAGNOSIS — D333 Benign neoplasm of cranial nerves: Secondary | ICD-10-CM

## 2017-02-05 DIAGNOSIS — G510 Bell's palsy: Secondary | ICD-10-CM

## 2017-02-05 DIAGNOSIS — R519 Headache, unspecified: Secondary | ICD-10-CM

## 2017-02-08 ENCOUNTER — Other Ambulatory Visit: Payer: Self-pay | Admitting: *Deleted

## 2017-02-09 ENCOUNTER — Other Ambulatory Visit: Payer: Self-pay | Admitting: *Deleted

## 2017-02-09 NOTE — Patient Outreach (Signed)
[facility]) Care Management  02/09/2017  [name]. October 22, 1960 [id number]   Phone call to patient to assist with transportation resources to get to his medical appointments. Per patient, his wife has to miss work to take him to his medical appointments at this time and he would like other options.  Patient confirms that his primary care doctor is Dr. Claris Gower  in [place]. Patient requesting 1:1 transportation  Patient's transportation benefit through [facility] discussed. Per patient, he has never used the benefit however states that he has heard that there are long wait times.  This Education officer, museum provided patient with the contact information to [place] a ride [phone]  and encouraged him to contact them to discuss the specifics of the transportation and to ask questions about his special accomodation needs. Patient also given the contact information for [employer] [phone]  Privately paying for a private car also discussed.  Contact information given to patient for [employer] [phone].  as well. Patient verbalized having no additional  Questions regarding transportation resources. Patient hesitant but willing to call [employer] to discuss his transportation needs.  Patient verbalized having no additional community resource needs. Patient to be closed to social work att his time.    Sheralyn Boatman [facility] Care Management [phone]

## 2017-02-09 NOTE — Patient Outreach (Signed)
[place] [facility]) Care Management  02/09/2017  [name]. January 25, 1961 [id number]  EMMI- Health Failure RED ON EMMI ALERT DAY#: 1 DATE: 02/07/17 RED ALERT: Have a way to get to follow-up appointment? No  Spoke with patient. Reviewed and addressed red alert. Patient reported, he was started on Cardia. This medication causes his heart to beat "hard" and being unable to sleep at night". He "stopped taking the medication and restarted his Atenolol". The patient didn't relay this information to the doctor. Informed patient, he should notify his PCP that he stopped the Cardia and "blood thinner". Patient verbalized stopping his medication related to dental work. Patient explained, "If he needs to have dental work completed; he would have to wait 2 to 3 days due to the blood thinner".   Patient hasn't arranged a follow-up appointment with his PCP since being discharged from the hospital on 01/28/17. He "doesn't have transportation". He "can't ride a bus due to being on a water pill and Oxygen". He stated, the bus ride takes 3 to 4 hours, which he can't tolerate.   Plan: RN CM will send referral to [facility] RN for further in home eval/assessment of care needs and management of chronic conditions. RN CM will send referral to [facility] for assistance with medical transportation. RN CM will send referral to [facility] for possible assistance with medication management. RN CM advised patient to contact RNCM for any needs or concerns.  Lake Bells, RN, BSN, MHA/MSL, [place] Telephonic Care Manager Coordinator [facility] Direct Phone: [phone] Toll Free: [phone] Fax: [phone]

## 2017-02-12 ENCOUNTER — Other Ambulatory Visit: Payer: Self-pay | Admitting: Registered Nurse

## 2017-02-12 ENCOUNTER — Encounter: Payer: Self-pay | Admitting: *Deleted

## 2017-02-12 ENCOUNTER — Other Ambulatory Visit: Payer: Self-pay | Admitting: Pharmacist

## 2017-02-12 ENCOUNTER — Other Ambulatory Visit: Payer: Self-pay | Admitting: *Deleted

## 2017-02-12 DIAGNOSIS — B49 Unspecified mycosis: Secondary | ICD-10-CM

## 2017-02-12 NOTE — Patient Outreach (Signed)
[place] [facility]) Care Management  02/12/2017  [name]. 08-18-1961 [id number]  Patient was referred to [place] Pharmacist by [facility] RN [name], due to patient reported medication concerns.    Successful phone outreach to patient, HIPAA details verified with patient and purpose of call explained to patient.   He was admitted 6/10-6/16/18 and found to be in atrial fibrillation.   Per review of discharge summary, patient was discharged on new medications including: -apixaban---Eliquis -diltiazem -glipizide  Patient reports he has stopped taking all of these medications.  He was concerned with bleeding risk with Eliquis.  He is concerned about taking furosemide twice daily as dose was increased by hospital.     Patient counseling:  -patient was counseled he should take his medications as directed by his medical prescribers and he was strongly encouraged to follow-up with his medical providers.  -he was counseled Eliquis is used to reduce the risk of stroke in patients with atrial fibrillation and that it does increase risk of bleeding/bruising---he was counseled he should discuss these concerns with his medical provider.   -he asked about taking diltiazem as needed---he was counseled his discharge medication list was to take medication daily and it is not typically taken as needed---he was again counseled he should discuss with his medical provider.    -he was counseled if he takes furosemide at 0730 as he reports, he could try to take second dose around 5pm.    Plan:  Will route note to PCP---he reports PCP is [name] in [place].    Received call from [name] that patient told her he prefers to receive no further calls from [facility].   Will close patient's pharmacy case as he denies pharmacy related needs---he was counseled to follow-up with his medical provider.    [name], PharmD, [place] [phone]

## 2017-02-12 NOTE — Patient Outreach (Signed)
[place] [facility]) Care Management [facility] CM Telephone Outreach, Transition of Care attempt #1 [facility] CM Red EMMI alert notification follow up  02/12/2017  [name]. August 01, 1961 [id number]  Successful telephone outreach to [name], 56 y/o male referred to [place] from [place] for transition of care after recent hospital visit June 10-16, 2018 for acute encephalopathy related to septic UTI and CHF exacerbation.  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    Received Red EMMI alert that patient had not been completing daily weights; explained purpose of call to patient and [facility] CM services.  Patient immediately said, "I am sick and tired of all these phone calls... I just got off the phone with you people, and I can not afford your services and I do not want your services."  Attempted to explain to patient that there was no charge for [facility] CM services, and again attempted to discuss [facility] CM services with patient.  Patient cursed throughout brief phone call, stating that he was mis-managed at the hospital and "wasn't going to take the medicine" that was prescribed for him post-hospital discharge.  Patient ended our conversation by saying, "don't ever call me again," and hanging phone up without allowing any response back.  Plan:  Notified [facility] CMA [place] [facility] and [facility] Pharmacist Lennette Bihari Ruedingerof patient's response to phone call attempt today  Will notify patient's PCP that patient has declined [facility] CM services.  Oneta Rack, RN, BSN, [employer] [facility] Care Management  [phone]

## 2017-02-22 ENCOUNTER — Other Ambulatory Visit: Payer: Self-pay | Admitting: *Deleted

## 2017-02-22 DIAGNOSIS — B49 Unspecified mycosis: Secondary | ICD-10-CM

## 2017-02-22 MED ORDER — NYSTATIN 100000 UNIT/GM EX POWD: 1.0000 g | Freq: Two times a day (BID) | CUTANEOUS | 3 refills | Status: DC | PRN

## 2017-02-22 NOTE — Telephone Encounter (Signed)
Resent to mail order [place]

## 2017-03-12 ENCOUNTER — Encounter: Payer: Self-pay | Admitting: Registered Nurse

## 2017-03-12 ENCOUNTER — Encounter: Payer: Medicare PPO | Attending: Physical Medicine & Rehabilitation | Admitting: Registered Nurse

## 2017-03-12 ENCOUNTER — Telehealth: Payer: Self-pay | Admitting: Registered Nurse

## 2017-03-12 VITALS — BP 105/73 | HR 93 | Temp 98.3°F

## 2017-03-12 DIAGNOSIS — Z5181 Encounter for therapeutic drug level monitoring: Secondary | ICD-10-CM | POA: Diagnosis not present

## 2017-03-12 DIAGNOSIS — I2510 Atherosclerotic heart disease of native coronary artery without angina pectoris: Secondary | ICD-10-CM | POA: Diagnosis not present

## 2017-03-12 DIAGNOSIS — M62838 Other muscle spasm: Secondary | ICD-10-CM | POA: Insufficient documentation

## 2017-03-12 DIAGNOSIS — I252 Old myocardial infarction: Secondary | ICD-10-CM | POA: Diagnosis not present

## 2017-03-12 DIAGNOSIS — G939 Disorder of brain, unspecified: Secondary | ICD-10-CM | POA: Diagnosis not present

## 2017-03-12 DIAGNOSIS — G629 Polyneuropathy, unspecified: Secondary | ICD-10-CM | POA: Diagnosis not present

## 2017-03-12 DIAGNOSIS — Z79899 Other long term (current) drug therapy: Secondary | ICD-10-CM

## 2017-03-12 DIAGNOSIS — F17210 Nicotine dependence, cigarettes, uncomplicated: Secondary | ICD-10-CM | POA: Insufficient documentation

## 2017-03-12 DIAGNOSIS — I509 Heart failure, unspecified: Secondary | ICD-10-CM | POA: Diagnosis not present

## 2017-03-12 DIAGNOSIS — Z930 Tracheostomy status: Secondary | ICD-10-CM | POA: Diagnosis not present

## 2017-03-12 DIAGNOSIS — G8929 Other chronic pain: Secondary | ICD-10-CM | POA: Insufficient documentation

## 2017-03-12 DIAGNOSIS — R51 Headache: Secondary | ICD-10-CM

## 2017-03-12 DIAGNOSIS — Z9889 Other specified postprocedural states: Secondary | ICD-10-CM

## 2017-03-12 DIAGNOSIS — Z760 Encounter for issue of repeat prescription: Secondary | ICD-10-CM | POA: Diagnosis not present

## 2017-03-12 DIAGNOSIS — E119 Type 2 diabetes mellitus without complications: Secondary | ICD-10-CM | POA: Insufficient documentation

## 2017-03-12 DIAGNOSIS — R600 Localized edema: Secondary | ICD-10-CM | POA: Diagnosis not present

## 2017-03-12 DIAGNOSIS — Z951 Presence of aortocoronary bypass graft: Secondary | ICD-10-CM | POA: Insufficient documentation

## 2017-03-12 DIAGNOSIS — Z8249 Family history of ischemic heart disease and other diseases of the circulatory system: Secondary | ICD-10-CM | POA: Diagnosis not present

## 2017-03-12 DIAGNOSIS — G510 Bell's palsy: Secondary | ICD-10-CM | POA: Diagnosis not present

## 2017-03-12 DIAGNOSIS — M109 Gout, unspecified: Secondary | ICD-10-CM | POA: Diagnosis not present

## 2017-03-12 DIAGNOSIS — G894 Chronic pain syndrome: Secondary | ICD-10-CM

## 2017-03-12 DIAGNOSIS — Z8673 Personal history of transient ischemic attack (TIA), and cerebral infarction without residual deficits: Secondary | ICD-10-CM | POA: Diagnosis not present

## 2017-03-12 DIAGNOSIS — Z87442 Personal history of urinary calculi: Secondary | ICD-10-CM | POA: Insufficient documentation

## 2017-03-12 DIAGNOSIS — I255 Ischemic cardiomyopathy: Secondary | ICD-10-CM | POA: Diagnosis not present

## 2017-03-12 DIAGNOSIS — D333 Benign neoplasm of cranial nerves: Secondary | ICD-10-CM

## 2017-03-12 DIAGNOSIS — M797 Fibromyalgia: Secondary | ICD-10-CM | POA: Diagnosis not present

## 2017-03-12 DIAGNOSIS — I110 Hypertensive heart disease with heart failure: Secondary | ICD-10-CM | POA: Insufficient documentation

## 2017-03-12 DIAGNOSIS — Z809 Family history of malignant neoplasm, unspecified: Secondary | ICD-10-CM | POA: Insufficient documentation

## 2017-03-12 MED ORDER — HYDROCODONE-ACETAMINOPHEN 10-325 MG PO TABS: ORAL_TABLET | ORAL | 0 refills | Status: DC

## 2017-03-12 NOTE — Progress Notes (Signed)
Subjective:    Patient ID: Frank Pat., male    DOB: [birth year], 56 y.o.   MRN: [id number]  HPI: [name] is a 56 year old male who returns for follow up appointmentfor chronic pain and medication refill. He states his pain is located right facial fain and right foott.He rates his pain 7. His current exercise regime is walking.He's using his cadillac walker.   [name] was hospitalized at [facility] on 01/28/2017 and Discharged 02/03/2017, admission diagnosis acute encephalopathy.   Wife in room all questions answered.   Pain Inventory Average Pain . Pain Right Now 7 My pain is burning and tingling  In the last 24 hours, has pain interfered with the following? General activity 6 Relation with others . Enjoyment of life 2 What TIME of day is your pain at its worst? night Sleep (in general) Fair  Pain is worse with: walking and inactivity Pain improves with: . Relief from Meds: 6  Mobility walk with assistance use a walker ability to climb steps?  no do you drive?  no  Function disabled: date disabled . retired I need assistance with the following:  dressing, bathing and meal prep Do you have any goals in this area?  no  Neuro/Psych trouble walking spasms  Prior Studies .  Physicians involved in your care .   Family History  Problem Relation Age of Onset  . CAD Father   . Hypertension Mother   . Cancer Other        multiple relatives   Social History   Social History  . Marital status: Married    Spouse name: N/A  . Number of children: N/A  . Years of education: N/A   Social History Main Topics  . Smoking status: Light Tobacco Smoker    Packs/day: 0.25    Years: 20.00    Types: Cigarettes  . Smokeless tobacco: Current User     Comment: Rare tobacco.  "[name]".  . Alcohol use No  . Drug use: No  . Sexual activity: Yes   Other Topics Concern  . Not on file   Social History Narrative  . No narrative  on file   Past Surgical History:  Procedure Laterality Date  . 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 ANAL FISTULECTOMY    . CORONARY ARTERY BYPASS GRAFT     LIMA to LAD 2009  . CRANIOTOMY Right 11/20/2012   Procedure: CRANIOTOMY REPAIR DURAL/CENTRAL SPINAL FLUID LEAK;  Surgeon: [name], MD;  Location: [place] NEURO ORS;  Service: Neurosurgery;  Laterality: Right;  . EYE SURGERY     to coorect lid and double vision  . MEDIAN RECTUS REPAIR Right 02/16/2016   Procedure: RIGHT LATERAL RECTUS RESECTION; SUPERIOR RECTUS RESECTION RIGHT EYE; RIGHT SUPERIOR RECTUS RECESSION; LATERAL TARSAL STRIP RIGHT LOWER EYELID;  Surgeon: [name], MD;  Location: [place];  Service: Ophthalmology;  Laterality: Right;  . PLACEMENT OF LUMBAR DRAIN N/A 11/20/2012   Procedure: Attempted PLACEMENT OF LUMBAR DRAIN;  Surgeon: [name], MD;  Location: [place] NEURO ORS;  Service: Neurosurgery;  Laterality: N/A;  . RETROSIGMOID CRANIECTOMY FOR TUMOR RESECTION Right 10/11/2012   Procedure: RETROSIGMOID CRANIECTOMY FOR TUMOR RESECTION;  Surgeon: [name], MD;  Location: [place] NEURO ORS;  Service: Neurosurgery;  Laterality: Right;  Craniotomy for acoustic  neuroma  . TRACHEOSTOMY TUBE PLACEMENT N/A 10/11/2012   Procedure: TRACHEOSTOMY;  Surgeon: Ascencion Dike, MD;  [facility];  Service: ENT;  Laterality: N/A;  . UMBILICAL HERNIA REPAIR     Past Medical History:  Diagnosis Date  . Brain tumor ([place])    Takes Topamax so patient will not have headaches  . Bruises easily   . CAD (coronary artery disease)    Cath 09, 99% LAD  . Cardiomyopathy, ischemic    Ischemic  . CHF (congestive heart failure) ([place])   . Cyst near coccyx   . Cyst near tailbone   . Diabetes mellitus without complication (HCC)    borderline  . Edema of both legs    Takes Lasix  . Fibromyalgia   . Gout   .  Headache(784.0)    with lights  . Hypertension    dr Percival Spanish  . Kidney stones   . Myocardial infarction ([place])   . Obesity   . Pneumonia    hx of  . Shortness of breath   . Stroke [facility])    Affected vision, walking, and facial drooping on right face   There were no vitals taken for this visit.  Opioid Risk Score:   Fall Risk Score:  `1  Depression screen PHQ 2/9  Depression screen PHQ 2/9 02/15/2015  Decreased Interest 0  Down, Depressed, Hopeless 0  PHQ - 2 Score 0  Altered sleeping 0  Tired, decreased energy 0  Change in appetite 0  Feeling bad or failure about yourself  0  Trouble concentrating 0  Moving slowly or fidgety/restless 0  Suicidal thoughts 0  PHQ-9 Score 0     Review of Systems  Constitutional: Positive for unexpected weight change.  Cardiovascular:       Limb swelling  Gastrointestinal: Positive for constipation.  Skin: Positive for rash.       Objective:   Physical Exam  Constitutional: He is oriented to person, place, and time. He appears well-developed and well-nourished.  HENT:  Head: Normocephalic and atraumatic.  Neck: Normal range of motion. Neck supple.  Cardiovascular: Normal rate and regular rhythm.   Pulmonary/Chest: Effort normal and breath sounds normal.  Continuous Oxygen 3 liters nasal cannula  Musculoskeletal:  Normal Muscle Bulk and Muscle Testing Reveals: Upper Extremities: Full ROM and Muscle Strength 5/5 Lower Extremities: Decreased ROM and Muscle Strength 5/5 Arises from Table Slowly using walker for support Wide Based gait   Neurological: He is alert and oriented to person, place, and time.  Skin: Skin is warm and dry.  Psychiatric: He has a normal mood and affect.  Nursing note and vitals reviewed.         Assessment & Plan:  1. 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 S/P RIGHT LATERAL RECTUS RESECTION; SUPERIOR RECTUS RESECTION  RIGHT EYE; RIGHT SUPERIOR RECTUS RECESSION; LATERAL TARSAL STRIP RIGHT LOWER EYELID with Dr. Frederico Hamman on 02/16/16. Refilled: Hydrocodone 10/325mg  one tablet every 6 hours as needed #120. Second script for the following month. 03/12/2017 We will continue the opioid monitoring program, this consists of regular clinic visits, examinations, urine drug screen, pill counts as well as use of [place] Controlled Substance Reporting System. Continue eye ointment as ordered. 2. Headaches right temporal area related to tumor/surgery. Continue Topamax. 03/12/2017 3. Lower extremity edema, morbid obesity. Continue to Monitor. PCP Following. 03/12/2017 4. Muscle Spasm: Continue Tizanidine. 03/12/2017 5. Peripheral Neuropathy:Continue Topamax and Tegretol  Continue  to Monitor. 03/12/2017    20 minutes of face to face patient care time was spent during this visit. All questions were encouraged and answered.   F/U in 2 months

## 2017-03-12 NOTE — Telephone Encounter (Signed)
On 03/12/2017 the  [facility] was reviewed no conflict was seen on the [place] with multiple prescribers. Mr. Diekman has a signed narcotic contract with our office. If there were any discrepancies this would have been reported to his physician.

## 2017-04-17 ENCOUNTER — Other Ambulatory Visit: Payer: Self-pay | Admitting: Physical Medicine & Rehabilitation

## 2017-04-17 DIAGNOSIS — R519 Headache, unspecified: Secondary | ICD-10-CM

## 2017-04-17 DIAGNOSIS — G510 Bell's palsy: Secondary | ICD-10-CM

## 2017-04-17 DIAGNOSIS — G894 Chronic pain syndrome: Secondary | ICD-10-CM

## 2017-04-17 DIAGNOSIS — G939 Disorder of brain, unspecified: Secondary | ICD-10-CM

## 2017-04-17 DIAGNOSIS — R51 Headache: Secondary | ICD-10-CM

## 2017-05-14 ENCOUNTER — Encounter: Payer: Self-pay | Admitting: Registered Nurse

## 2017-05-14 ENCOUNTER — Encounter: Payer: Medicare PPO | Attending: Physical Medicine & Rehabilitation | Admitting: Registered Nurse

## 2017-05-14 VITALS — BP 136/87 | HR 87

## 2017-05-14 DIAGNOSIS — I2510 Atherosclerotic heart disease of native coronary artery without angina pectoris: Secondary | ICD-10-CM | POA: Diagnosis not present

## 2017-05-14 DIAGNOSIS — R51 Headache: Secondary | ICD-10-CM | POA: Diagnosis not present

## 2017-05-14 DIAGNOSIS — I255 Ischemic cardiomyopathy: Secondary | ICD-10-CM | POA: Diagnosis not present

## 2017-05-14 DIAGNOSIS — G8929 Other chronic pain: Secondary | ICD-10-CM | POA: Diagnosis present

## 2017-05-14 DIAGNOSIS — G629 Polyneuropathy, unspecified: Secondary | ICD-10-CM | POA: Diagnosis not present

## 2017-05-14 DIAGNOSIS — Z930 Tracheostomy status: Secondary | ICD-10-CM | POA: Insufficient documentation

## 2017-05-14 DIAGNOSIS — E119 Type 2 diabetes mellitus without complications: Secondary | ICD-10-CM | POA: Diagnosis not present

## 2017-05-14 DIAGNOSIS — Z760 Encounter for issue of repeat prescription: Secondary | ICD-10-CM | POA: Insufficient documentation

## 2017-05-14 DIAGNOSIS — D333 Benign neoplasm of cranial nerves: Secondary | ICD-10-CM | POA: Insufficient documentation

## 2017-05-14 DIAGNOSIS — G894 Chronic pain syndrome: Secondary | ICD-10-CM

## 2017-05-14 DIAGNOSIS — M797 Fibromyalgia: Secondary | ICD-10-CM | POA: Diagnosis not present

## 2017-05-14 DIAGNOSIS — I252 Old myocardial infarction: Secondary | ICD-10-CM | POA: Insufficient documentation

## 2017-05-14 DIAGNOSIS — Z87442 Personal history of urinary calculi: Secondary | ICD-10-CM | POA: Diagnosis not present

## 2017-05-14 DIAGNOSIS — Z79899 Other long term (current) drug therapy: Secondary | ICD-10-CM

## 2017-05-14 DIAGNOSIS — F17210 Nicotine dependence, cigarettes, uncomplicated: Secondary | ICD-10-CM | POA: Insufficient documentation

## 2017-05-14 DIAGNOSIS — Z8673 Personal history of transient ischemic attack (TIA), and cerebral infarction without residual deficits: Secondary | ICD-10-CM | POA: Diagnosis not present

## 2017-05-14 DIAGNOSIS — Z951 Presence of aortocoronary bypass graft: Secondary | ICD-10-CM | POA: Diagnosis not present

## 2017-05-14 DIAGNOSIS — G510 Bell's palsy: Secondary | ICD-10-CM | POA: Insufficient documentation

## 2017-05-14 DIAGNOSIS — Z9889 Other specified postprocedural states: Secondary | ICD-10-CM | POA: Diagnosis not present

## 2017-05-14 DIAGNOSIS — Z5181 Encounter for therapeutic drug level monitoring: Secondary | ICD-10-CM

## 2017-05-14 DIAGNOSIS — M62838 Other muscle spasm: Secondary | ICD-10-CM | POA: Diagnosis not present

## 2017-05-14 DIAGNOSIS — I110 Hypertensive heart disease with heart failure: Secondary | ICD-10-CM | POA: Diagnosis not present

## 2017-05-14 DIAGNOSIS — Z809 Family history of malignant neoplasm, unspecified: Secondary | ICD-10-CM | POA: Insufficient documentation

## 2017-05-14 DIAGNOSIS — M109 Gout, unspecified: Secondary | ICD-10-CM | POA: Insufficient documentation

## 2017-05-14 DIAGNOSIS — I509 Heart failure, unspecified: Secondary | ICD-10-CM | POA: Insufficient documentation

## 2017-05-14 DIAGNOSIS — R600 Localized edema: Secondary | ICD-10-CM | POA: Diagnosis not present

## 2017-05-14 DIAGNOSIS — Z8249 Family history of ischemic heart disease and other diseases of the circulatory system: Secondary | ICD-10-CM | POA: Diagnosis not present

## 2017-05-14 MED ORDER — HYDROCODONE-ACETAMINOPHEN 10-325 MG PO TABS
ORAL_TABLET | ORAL | 0 refills | Status: DC
Start: 2017-05-14 — End: 2017-05-14

## 2017-05-14 MED ORDER — HYDROCODONE-ACETAMINOPHEN 10-325 MG PO TABS: ORAL_TABLET | ORAL | 0 refills | Status: DC

## 2017-05-14 NOTE — Progress Notes (Signed)
Subjective:    Patient ID: Frank Pat., male    DOB: [birth year], 56 y.o.   MRN: [id number]  HPI: [name] is a 56 year old male who returns for follow up appointmentfor chronic pain and medication refill. He states his pain is located in his right eye with right facial pain.He rates his pain 7. His current exercise regime is walking.He's using his cadillac walker.   Last UDS was performed on 01/08/2017  Wife in room all questions answered.   Pain Inventory Average Pain 7 Pain Right Now 7 My pain is stabbing and tingling  In the last 24 hours, has pain interfered with the following? General activity 5 Relation with others 0 Enjoyment of life 5 What TIME of day is your pain at its worst? daytime Sleep (in general) Fair  Pain is worse with: walking and standing Pain improves with: medication Relief from Meds: 5  Mobility walk with assistance use a walker ability to climb steps?  no do you drive?  no  Function disabled: date disabled . retired I need assistance with the following:  dressing, bathing, meal prep, household duties and shopping Do you have any goals in this area?  no  Neuro/Psych tingling trouble walking  Prior Studies Any changes since last visit?  no  Physicians involved in your care Any changes since last visit?  no   Family History  Problem Relation Age of Onset  . CAD Father   . Hypertension Mother   . Cancer Other        multiple relatives   Social History   Social History  . Marital status: Married    Spouse name: N/A  . Number of children: N/A  . Years of education: N/A   Social History Main Topics  . Smoking status: Light Tobacco Smoker    Packs/day: 0.25    Years: 20.00    Types: Cigarettes  . Smokeless tobacco: Current User     Comment: Rare tobacco.  "[name]".  . Alcohol use No  . Drug use: No  . Sexual activity: Yes   Other Topics Concern  . None   Social History Narrative  .  None   Past Surgical History:  Procedure Laterality Date  . 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 ANAL FISTULECTOMY    . CORONARY ARTERY BYPASS GRAFT     LIMA to LAD 2009  . CRANIOTOMY Right 11/20/2012   Procedure: CRANIOTOMY REPAIR DURAL/CENTRAL SPINAL FLUID LEAK;  Surgeon: [name], MD;  Location: [place] NEURO ORS;  Service: Neurosurgery;  Laterality: Right;  . EYE SURGERY     to coorect lid and double vision  . MEDIAN RECTUS REPAIR Right 02/16/2016   Procedure: RIGHT LATERAL RECTUS RESECTION; SUPERIOR RECTUS RESECTION RIGHT EYE; RIGHT SUPERIOR RECTUS RECESSION; LATERAL TARSAL STRIP RIGHT LOWER EYELID;  Surgeon: [name], MD;  Location: [place];  Service: Ophthalmology;  Laterality: Right;  . PLACEMENT OF LUMBAR DRAIN N/A 11/20/2012   Procedure: Attempted PLACEMENT OF LUMBAR DRAIN;  Surgeon: [name], MD;  Location: [place] NEURO ORS;  Service: Neurosurgery;  Laterality: N/A;  . RETROSIGMOID CRANIECTOMY FOR TUMOR RESECTION Right 10/11/2012   Procedure: RETROSIGMOID CRANIECTOMY FOR TUMOR RESECTION;  Surgeon: [name], MD;  Location: [place] NEURO ORS;  Service: Neurosurgery;  Laterality: Right;  Craniotomy for  acoustic neuroma  . TRACHEOSTOMY TUBE PLACEMENT N/A 10/11/2012   Procedure: TRACHEOSTOMY;  Surgeon: Ascencion Dike, MD;  [facility];  Service: ENT;  Laterality: N/A;  . UMBILICAL HERNIA REPAIR     Past Medical History:  Diagnosis Date  . Brain tumor ([place])    Takes Topamax so patient will not have headaches  . Bruises easily   . CAD (coronary artery disease)    Cath 09, 99% LAD  . Cardiomyopathy, ischemic    Ischemic  . CHF (congestive heart failure) ([place])   . Cyst near coccyx   . Cyst near tailbone   . Diabetes mellitus without complication (HCC)    borderline  . Edema of both legs    Takes Lasix  . Fibromyalgia   . Gout   .  Headache(784.0)    with lights  . Hypertension    dr Percival Spanish  . Kidney stones   . Myocardial infarction ([place])   . Obesity   . Pneumonia    hx of  . Shortness of breath   . Stroke [facility])    Affected vision, walking, and facial drooping on right face   BP 136/87   Pulse 87   SpO2 93% Comment: [name]  Opioid Risk Score:  0 Fall Risk Score:  `1  Depression screen PHQ 2/9  Depression screen [facility] 2/9 05/14/2017 02/15/2015  Decreased Interest 0 0  Down, Depressed, Hopeless 0 0  PHQ - 2 Score 0 0  Altered sleeping - 0  Tired, decreased energy - 0  Change in appetite - 0  Feeling bad or failure about yourself  - 0  Trouble concentrating - 0  Moving slowly or fidgety/restless - 0  Suicidal thoughts - 0  PHQ-9 Score - 0     Review of Systems  Constitutional: Positive for diaphoresis and unexpected weight change.  HENT: Negative.   Eyes: Positive for discharge.  Respiratory: Negative.   Cardiovascular: Positive for leg swelling.       Limb swelling  Gastrointestinal: Positive for constipation.  Endocrine:       High and low blood sugars  Genitourinary: Negative.   Musculoskeletal: Positive for gait problem.  Skin: Negative.   Allergic/Immunologic: Negative.   Neurological:       Tingling  Hematological: Negative.   Psychiatric/Behavioral: Negative.   All other systems reviewed and are negative.      Objective:   Physical Exam  Constitutional: He is oriented to person, place, and time. He appears well-developed and well-nourished.  HENT:  Head: Normocephalic and atraumatic.  Neck: Normal range of motion. Neck supple.  Cardiovascular: Normal rate and regular rhythm.   Pulmonary/Chest: Effort normal and breath sounds normal.  Continuous Oxygen 3 liters nasal cannula  Musculoskeletal:  Normal Muscle Bulk and Muscle Testing Reveals: Upper Extremities: Full ROM and Muscle Strength 5/5 Lower Extremities: Decreased ROM and Muscle Strength 5/5 Arises from Table Slowly  using walker for support Anasarca to lower extremities Wide Based gait   Neurological: He is alert and oriented to person, place, and time.  Skin: Skin is warm and dry.  Psychiatric: He has a normal mood and affect.  Nursing note and vitals reviewed.         Assessment & Plan:  1. 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 S/P RIGHT LATERAL RECTUS RESECTION; SUPERIOR RECTUS RESECTION RIGHT EYE; RIGHT SUPERIOR RECTUS RECESSION; LATERAL TARSAL STRIP RIGHT LOWER EYELID with Dr. Frederico Hamman  on 02/16/16. Refilled: Hydrocodone 10/325mg  one tablet every 6 hours as needed #120. Second script for the following month. 05/14/2017 We will continue the opioid monitoring program, this consists of regular clinic visits, examinations, urine drug screen, pill counts as well as use of [place] Controlled Substance Reporting System. Continue eye ointment as ordered. 2. Headaches right temporal area related to tumor/surgery. Continue Topamax. 05/14/2017 3. Lower extremity edema, morbid obesity. Continue to Monitor. PCP Following. 05/14/2017 4. Muscle Spasm: Continue Tizanidine. 05/14/2017 5. Peripheral Neuropathy:Continue Topamax and Tegretol  Continue to Monitor. 05/14/2017   20 minutes of face to face patient care time was spent during this visit. All questions were encouraged and answered.  F/U in 2 months

## 2017-07-16 ENCOUNTER — Encounter: Payer: Self-pay | Admitting: Physical Medicine & Rehabilitation

## 2017-07-16 ENCOUNTER — Encounter: Payer: Medicare PPO | Attending: Physical Medicine & Rehabilitation | Admitting: Physical Medicine & Rehabilitation

## 2017-07-16 ENCOUNTER — Other Ambulatory Visit: Payer: Self-pay

## 2017-07-16 VITALS — BP 129/81 | HR 86

## 2017-07-16 DIAGNOSIS — Z9889 Other specified postprocedural states: Secondary | ICD-10-CM | POA: Insufficient documentation

## 2017-07-16 DIAGNOSIS — Z8673 Personal history of transient ischemic attack (TIA), and cerebral infarction without residual deficits: Secondary | ICD-10-CM | POA: Diagnosis not present

## 2017-07-16 DIAGNOSIS — D333 Benign neoplasm of cranial nerves: Secondary | ICD-10-CM | POA: Insufficient documentation

## 2017-07-16 DIAGNOSIS — H5789 Other specified disorders of eye and adnexa: Secondary | ICD-10-CM

## 2017-07-16 DIAGNOSIS — I509 Heart failure, unspecified: Secondary | ICD-10-CM | POA: Diagnosis not present

## 2017-07-16 DIAGNOSIS — E119 Type 2 diabetes mellitus without complications: Secondary | ICD-10-CM | POA: Insufficient documentation

## 2017-07-16 DIAGNOSIS — I2510 Atherosclerotic heart disease of native coronary artery without angina pectoris: Secondary | ICD-10-CM | POA: Diagnosis not present

## 2017-07-16 DIAGNOSIS — S0451XA Injury of facial nerve, right side, initial encounter: Secondary | ICD-10-CM | POA: Diagnosis not present

## 2017-07-16 DIAGNOSIS — R519 Headache, unspecified: Secondary | ICD-10-CM

## 2017-07-16 DIAGNOSIS — R600 Localized edema: Secondary | ICD-10-CM | POA: Diagnosis not present

## 2017-07-16 DIAGNOSIS — Z809 Family history of malignant neoplasm, unspecified: Secondary | ICD-10-CM | POA: Insufficient documentation

## 2017-07-16 DIAGNOSIS — I255 Ischemic cardiomyopathy: Secondary | ICD-10-CM | POA: Insufficient documentation

## 2017-07-16 DIAGNOSIS — M62838 Other muscle spasm: Secondary | ICD-10-CM | POA: Diagnosis not present

## 2017-07-16 DIAGNOSIS — M797 Fibromyalgia: Secondary | ICD-10-CM | POA: Insufficient documentation

## 2017-07-16 DIAGNOSIS — G609 Hereditary and idiopathic neuropathy, unspecified: Secondary | ICD-10-CM | POA: Diagnosis not present

## 2017-07-16 DIAGNOSIS — Z930 Tracheostomy status: Secondary | ICD-10-CM | POA: Insufficient documentation

## 2017-07-16 DIAGNOSIS — Z87442 Personal history of urinary calculi: Secondary | ICD-10-CM | POA: Insufficient documentation

## 2017-07-16 DIAGNOSIS — G510 Bell's palsy: Secondary | ICD-10-CM

## 2017-07-16 DIAGNOSIS — G8929 Other chronic pain: Secondary | ICD-10-CM | POA: Insufficient documentation

## 2017-07-16 DIAGNOSIS — I252 Old myocardial infarction: Secondary | ICD-10-CM | POA: Insufficient documentation

## 2017-07-16 DIAGNOSIS — G939 Disorder of brain, unspecified: Secondary | ICD-10-CM | POA: Diagnosis not present

## 2017-07-16 DIAGNOSIS — I110 Hypertensive heart disease with heart failure: Secondary | ICD-10-CM | POA: Insufficient documentation

## 2017-07-16 DIAGNOSIS — Z8249 Family history of ischemic heart disease and other diseases of the circulatory system: Secondary | ICD-10-CM | POA: Diagnosis not present

## 2017-07-16 DIAGNOSIS — R51 Headache: Secondary | ICD-10-CM | POA: Diagnosis not present

## 2017-07-16 DIAGNOSIS — M109 Gout, unspecified: Secondary | ICD-10-CM | POA: Diagnosis not present

## 2017-07-16 DIAGNOSIS — F17210 Nicotine dependence, cigarettes, uncomplicated: Secondary | ICD-10-CM | POA: Insufficient documentation

## 2017-07-16 DIAGNOSIS — Z760 Encounter for issue of repeat prescription: Secondary | ICD-10-CM | POA: Diagnosis not present

## 2017-07-16 DIAGNOSIS — Z951 Presence of aortocoronary bypass graft: Secondary | ICD-10-CM | POA: Insufficient documentation

## 2017-07-16 DIAGNOSIS — G629 Polyneuropathy, unspecified: Secondary | ICD-10-CM | POA: Insufficient documentation

## 2017-07-16 MED ORDER — HYDROCODONE-ACETAMINOPHEN 10-325 MG PO TABS: ORAL_TABLET | ORAL | 0 refills | Status: DC

## 2017-07-16 NOTE — Patient Instructions (Addendum)
PLEASE FEEL FREE TO CALL OUR OFFICE WITH ANY PROBLEMS OR QUESTIONS ([phone])   YOU SHOULD TAKE A LAXATIVE/SOFTENER EVERY DAY.    SENOKOT-S IS A GOOD COMBINATION LAXATIVE WITH SOFTENER.

## 2017-07-16 NOTE — Progress Notes (Signed)
Subjective:    Patient ID: Frank Pat., male    DOB: 1961-01-19, 56 y.o.   MRN: [id number]  HPI   [name] is here in follow-up of his chronic pain.  For the most part his levels have been fairly stable since I last saw him.  He is not using the Tegretol and is not sure that he got the medication in the first place.  He continues on Topamax 100 mg at nighttime for headaches which seems to have worked fairly well for him.  Uses tizanidine for muscle spasms.  He moves his bowels every other day and has mild constipation.  He is hesitant to use a softener daily.  He has had improved vision through his right eye to the interventions of ophthalmology.  His right eye is less irritated.  He still sees a "film" over the eye.  He remains on hydrocodone for his pain control at the 10/325 dose 1 every 6 hours as needed.  His last refill was on November 16. Pain Inventory Average Pain 7 Pain Right Now 0 My pain is stabbing  In the last 24 hours, has pain interfered with the following? General activity 7 Relation with others 7 Enjoyment of life 7 What TIME of day is your pain at its worst? night Sleep (in general) Fair  Pain is worse with: walking, bending and standing Pain improves with: rest Relief from Meds: 10  Mobility walk with assistance use a walker how many minutes can you walk? Marland Kitchen ability to climb steps?  no do you drive?  no needs help with transfers  Function disabled: date disabled . retired I need assistance with the following:  dressing, bathing and meal prep  Neuro/Psych trouble walking  Prior Studies Any changes since last visit?  no  Physicians involved in your care Any changes since last visit?  no   Family History  Problem Relation Age of Onset  . CAD Father   . Hypertension Mother   . Cancer Other        multiple relatives   Social History   Socioeconomic History  . Marital status: Married    Spouse name: Not on file  . Number of  children: Not on file  . Years of education: Not on file  . Highest education level: Not on file  Social Needs  . Financial resource strain: Not on file  . Food insecurity - worry: Not on file  . Food insecurity - inability: Not on file  . Transportation needs - medical: Not on file  . Transportation needs - non-medical: Not on file  Occupational History  . Not on file  Tobacco Use  . Smoking status: Light Tobacco Smoker    Packs/day: 0.25    Years: 20.00    Pack years: 5.00    Types: Cigarettes  . Smokeless tobacco: Current User  . Tobacco comment: Rare tobacco.  "[name]".  Substance and Sexual Activity  . Alcohol use: No    Alcohol/week: 0.0 oz  . Drug use: No  . Sexual activity: Yes  Other Topics Concern  . Not on file  Social History Narrative  . Not on file   Past Surgical History:  Procedure Laterality Date  . 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 ANAL FISTULECTOMY    . CORONARY ARTERY BYPASS GRAFT  LIMA to LAD 2009  . CRANIOTOMY Right 11/20/2012   Procedure: CRANIOTOMY REPAIR DURAL/CENTRAL SPINAL FLUID LEAK;  Surgeon: [name], MD;  Location: [place] [facility];  Service: Neurosurgery;  Laterality: Right;  . EYE SURGERY     to coorect lid and double vision  . MEDIAN RECTUS REPAIR Right 02/16/2016   Procedure: RIGHT LATERAL RECTUS RESECTION; SUPERIOR RECTUS RESECTION RIGHT EYE; RIGHT SUPERIOR RECTUS RECESSION; LATERAL TARSAL STRIP RIGHT LOWER EYELID;  Surgeon: [name], MD;  Location: [place];  Service: Ophthalmology;  Laterality: Right;  . PLACEMENT OF LUMBAR DRAIN N/A 11/20/2012   Procedure: Attempted PLACEMENT OF LUMBAR DRAIN;  Surgeon: [name], MD;  Location: [place] [facility];  Service: Neurosurgery;  Laterality: N/A;  . RETROSIGMOID CRANIECTOMY FOR TUMOR RESECTION Right 10/11/2012   Procedure: RETROSIGMOID  CRANIECTOMY FOR TUMOR RESECTION;  Surgeon: [name], MD;  Location: [place] [facility];  Service: Neurosurgery;  Laterality: Right;  Craniotomy for acoustic neuroma  . TRACHEOSTOMY TUBE PLACEMENT N/A 10/11/2012   Procedure: TRACHEOSTOMY;  Surgeon: Ascencion Dike, MD;  [facility];  Service: ENT;  Laterality: N/A;  . UMBILICAL HERNIA REPAIR     Past Medical History:  Diagnosis Date  . Brain tumor ([place])    Takes Topamax so patient will not have headaches  . Bruises easily   . CAD (coronary artery disease)    Cath 09, 99% LAD  . Cardiomyopathy, ischemic    Ischemic  . CHF (congestive heart failure) ([place])   . Cyst near coccyx   . Cyst near tailbone   . Diabetes mellitus without complication (HCC)    borderline  . Edema of both legs    Takes Lasix  . Fibromyalgia   . Gout   . Headache(784.0)    with lights  . Hypertension    dr Percival Spanish  . Kidney stones   . Myocardial infarction ([place])   . Obesity   . Pneumonia    hx of  . Shortness of breath   . Stroke [facility])    Affected vision, walking, and facial drooping on right face   There were no vitals taken for this visit.  Opioid Risk Score:  0 Fall Risk Score:  `1  Depression screen PHQ 2/9  Depression screen [facility] 2/9 07/16/2017 05/14/2017 02/15/2015  Decreased Interest 0 0 0  Down, Depressed, Hopeless 0 0 0  PHQ - 2 Score 0 0 0  Altered sleeping - - 0  Tired, decreased energy - - 0  Change in appetite - - 0  Feeling bad or failure about yourself  - - 0  Trouble concentrating - - 0  Moving slowly or fidgety/restless - - 0  Suicidal thoughts - - 0  PHQ-9 Score - - 0     Review of Systems  Constitutional:       Night sweats  HENT: Negative.   Eyes: Negative.   Respiratory: Negative.   Cardiovascular: Positive for leg swelling.  Gastrointestinal: Positive for constipation.  Endocrine: Negative.   Genitourinary: Negative.   Musculoskeletal: Negative.   Skin: Negative.   Allergic/Immunologic: Negative.     Neurological: Negative.   Hematological: Negative.   Psychiatric/Behavioral: Negative.        Objective:   Physical Exam  Constitutional:morbidly obese  HENT: Right I can more easily closed but cannot close completely.  His sclera is less irritated.  Visual acuity remains poor Head: Normocephalicand atraumatic.  Neck: Normal range of motion. Neck supple.  Cardiovascular:  Regular  rate.  Pulmonary/Chest:  Clear to auscultation   Musculoskeletal: He exhibits edema.    gait slightly wide-based but stable with walker..  Neurological: strength nearly 4/5 to 5/5. Better able to close right eye lid. Right facial droop still present. Dysarthric. Attention issues and short-term memory issues remain.  Skin: Skin is warmand dry.  Psychiatric: He has a normal mood and affect.  N .       Assessment & Plan:  1. 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  He is s/p eye surgery last summer per  Dr. Frederico Hamman on . Refilled: Hydrocodone 10/325mg  one tablet every 6 hours as needed #120. Second script for the following month. We will continue the opioid monitoring program, this consists of regular clinic visits, examinations, routine drug screening, pill counts as well as use of [place] Controlled Substance Reporting System. [place] today.    2. Headaches right temporal area related to tumor/surgery.   Continue Topamax 100 mg nightly  3. Lower extremity edema, morbid obesity. Per primary.  4. Muscle Spasm: Continue Tizanidine.no refills required today 5. Peripheral Neuropathy: Taking tegretol? Family will check at home.    As far as I am concerned I will assume he stopped taking it.  15 minutes of face to face patient care time was spent during this visit. All questions were encouraged and answered. Follow up in 6 weeks

## 2017-08-29 ENCOUNTER — Ambulatory Visit: Payer: Medicare PPO | Admitting: Physical Medicine & Rehabilitation

## 2017-09-10 ENCOUNTER — Encounter: Payer: Medicare PPO | Attending: Physical Medicine & Rehabilitation | Admitting: Physical Medicine & Rehabilitation

## 2017-09-10 ENCOUNTER — Other Ambulatory Visit: Payer: Self-pay

## 2017-09-10 ENCOUNTER — Encounter: Payer: Self-pay | Admitting: Physical Medicine & Rehabilitation

## 2017-09-10 ENCOUNTER — Ambulatory Visit: Payer: Medicare PPO | Admitting: Physical Medicine & Rehabilitation

## 2017-09-10 VITALS — BP 139/86 | HR 87

## 2017-09-10 DIAGNOSIS — I110 Hypertensive heart disease with heart failure: Secondary | ICD-10-CM | POA: Insufficient documentation

## 2017-09-10 DIAGNOSIS — M797 Fibromyalgia: Secondary | ICD-10-CM | POA: Diagnosis not present

## 2017-09-10 DIAGNOSIS — Z79899 Other long term (current) drug therapy: Secondary | ICD-10-CM | POA: Diagnosis not present

## 2017-09-10 DIAGNOSIS — I2510 Atherosclerotic heart disease of native coronary artery without angina pectoris: Secondary | ICD-10-CM | POA: Insufficient documentation

## 2017-09-10 DIAGNOSIS — Z5181 Encounter for therapeutic drug level monitoring: Secondary | ICD-10-CM

## 2017-09-10 DIAGNOSIS — Z760 Encounter for issue of repeat prescription: Secondary | ICD-10-CM | POA: Insufficient documentation

## 2017-09-10 DIAGNOSIS — I255 Ischemic cardiomyopathy: Secondary | ICD-10-CM | POA: Diagnosis not present

## 2017-09-10 DIAGNOSIS — G939 Disorder of brain, unspecified: Secondary | ICD-10-CM

## 2017-09-10 DIAGNOSIS — Z9889 Other specified postprocedural states: Secondary | ICD-10-CM | POA: Insufficient documentation

## 2017-09-10 DIAGNOSIS — Z930 Tracheostomy status: Secondary | ICD-10-CM | POA: Diagnosis not present

## 2017-09-10 DIAGNOSIS — Z8249 Family history of ischemic heart disease and other diseases of the circulatory system: Secondary | ICD-10-CM | POA: Insufficient documentation

## 2017-09-10 DIAGNOSIS — G510 Bell's palsy: Secondary | ICD-10-CM | POA: Diagnosis not present

## 2017-09-10 DIAGNOSIS — G894 Chronic pain syndrome: Secondary | ICD-10-CM | POA: Diagnosis not present

## 2017-09-10 DIAGNOSIS — Z951 Presence of aortocoronary bypass graft: Secondary | ICD-10-CM | POA: Diagnosis not present

## 2017-09-10 DIAGNOSIS — F17210 Nicotine dependence, cigarettes, uncomplicated: Secondary | ICD-10-CM | POA: Insufficient documentation

## 2017-09-10 DIAGNOSIS — G609 Hereditary and idiopathic neuropathy, unspecified: Secondary | ICD-10-CM

## 2017-09-10 DIAGNOSIS — E119 Type 2 diabetes mellitus without complications: Secondary | ICD-10-CM | POA: Insufficient documentation

## 2017-09-10 DIAGNOSIS — R519 Headache, unspecified: Secondary | ICD-10-CM

## 2017-09-10 DIAGNOSIS — M109 Gout, unspecified: Secondary | ICD-10-CM | POA: Insufficient documentation

## 2017-09-10 DIAGNOSIS — G8929 Other chronic pain: Secondary | ICD-10-CM | POA: Diagnosis present

## 2017-09-10 DIAGNOSIS — D333 Benign neoplasm of cranial nerves: Secondary | ICD-10-CM | POA: Diagnosis not present

## 2017-09-10 DIAGNOSIS — G629 Polyneuropathy, unspecified: Secondary | ICD-10-CM | POA: Diagnosis not present

## 2017-09-10 DIAGNOSIS — R600 Localized edema: Secondary | ICD-10-CM | POA: Diagnosis not present

## 2017-09-10 DIAGNOSIS — M62838 Other muscle spasm: Secondary | ICD-10-CM | POA: Diagnosis not present

## 2017-09-10 DIAGNOSIS — I252 Old myocardial infarction: Secondary | ICD-10-CM | POA: Diagnosis not present

## 2017-09-10 DIAGNOSIS — Z8673 Personal history of transient ischemic attack (TIA), and cerebral infarction without residual deficits: Secondary | ICD-10-CM | POA: Insufficient documentation

## 2017-09-10 DIAGNOSIS — R51 Headache: Secondary | ICD-10-CM | POA: Insufficient documentation

## 2017-09-10 DIAGNOSIS — I509 Heart failure, unspecified: Secondary | ICD-10-CM | POA: Diagnosis not present

## 2017-09-10 DIAGNOSIS — Z87442 Personal history of urinary calculi: Secondary | ICD-10-CM | POA: Diagnosis not present

## 2017-09-10 DIAGNOSIS — Z809 Family history of malignant neoplasm, unspecified: Secondary | ICD-10-CM | POA: Insufficient documentation

## 2017-09-10 MED ORDER — CARBAMAZEPINE 200 MG PO TABS: 200.0000 mg | ORAL_TABLET | Freq: Two times a day (BID) | ORAL | 5 refills | Status: DC

## 2017-09-10 MED ORDER — HYDROCODONE-ACETAMINOPHEN 10-325 MG PO TABS: ORAL_TABLET | ORAL | 0 refills | Status: DC

## 2017-09-10 MED ORDER — TIZANIDINE HCL 4 MG PO TABS: ORAL_TABLET | ORAL | 3 refills | Status: DC

## 2017-09-10 MED ORDER — HYDROCODONE-ACETAMINOPHEN 10-325 MG PO TABS
ORAL_TABLET | ORAL | 0 refills | Status: DC
Start: 2017-09-10 — End: 2017-11-09

## 2017-09-10 NOTE — Progress Notes (Signed)
Subjective:    Patient ID: Frank Pat., male    DOB: [birth year], 57 y.o.   MRN: [id number]  HPI   [name] is here in follow up of his chronic pain.  He hasn't had many changes in his pain. He thinks in hind sight that the tegretol might have been helping. He's been off of it for a couple months now.  He has been stable from a postoperative standpoint.  His right eye has been much less irritated.  He continues to have limitations in his facial movement and control from the right side of his mouth.  Hearing remains substantially impaired.  He has been medically stable otherwise since I last saw him.  He is using hydrocodone for more severe pain.  He also takes Zanaflex for spasms.  This helps him sleep additionally.  Topamax on board for headache prophylaxis  Pain Inventory Average Pain 6 Pain Right Now 6 My pain is sharp, burning, stabbing and tingling  In the last 24 hours, has pain interfered with the following? General activity 6 Relation with others 6 Enjoyment of life 6 What TIME of day is your pain at its worst? evening Sleep (in general) Fair  Pain is worse with: standing Pain improves with: rest and medication Relief from Meds: 5  Mobility walk with assistance use a walker ability to climb steps?  no do you drive?  no needs help with transfers  Function not employed: date last employed . disabled: date disabled . I need assistance with the following:  dressing and bathing  Neuro/Psych trouble walking  Prior Studies Any changes since last visit?  no  Physicians involved in your care Any changes since last visit?  no   Family History  Problem Relation Age of Onset  . CAD Father   . Hypertension Mother   . Cancer Other        multiple relatives   Social History   Socioeconomic History  . Marital status: Married    Spouse name: None  . Number of children: None  . Years of education: None  . Highest education level: None  Social Needs    . Financial resource strain: None  . Food insecurity - worry: None  . Food insecurity - inability: None  . Transportation needs - medical: None  . Transportation needs - non-medical: None  Occupational History  . None  Tobacco Use  . Smoking status: Light Tobacco Smoker    Packs/day: 0.25    Years: 20.00    Pack years: 5.00    Types: Cigarettes  . Smokeless tobacco: Current User  . Tobacco comment: Rare tobacco.  "[name]".  Substance and Sexual Activity  . Alcohol use: No    Alcohol/week: 0.0 oz  . Drug use: No  . Sexual activity: Yes  Other Topics Concern  . None  Social History Narrative  . None   Past Surgical History:  Procedure Laterality Date  . 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 ANAL FISTULECTOMY    . CORONARY ARTERY BYPASS GRAFT     LIMA to LAD 2009  . CRANIOTOMY Right 11/20/2012   Procedure: CRANIOTOMY REPAIR DURAL/CENTRAL SPINAL FLUID LEAK;  Surgeon: [name], MD;  Location: [place] NEUR[facility] ORS;  Service: Neurosurgery;  Laterality: Right;  . EYE SURGERY     to coorect  lid and double vision  . MEDIAN RECTUS REPAIR Right 02/16/2016   Procedure: RIGHT LATERAL RECTUS RESECTION; SUPERIOR RECTUS RESECTION RIGHT EYE; RIGHT SUPERIOR RECTUS RECESSION; LATERAL TARSAL STRIP RIGHT LOWER EYELID;  Surgeon: [name], MD;  Location: [place];  Service: Ophthalmology;  Laterality: Right;  . PLACEMENT OF LUMBAR DRAIN N/A 11/20/2012   Procedure: Attempted PLACEMENT OF LUMBAR DRAIN;  Surgeon: [name], MD;  Location: [place] NEURO ORS;  Service: Neurosurgery;  Laterality: N/A;  . RETROSIGMOID CRANIECTOMY FOR TUMOR RESECTION Right 10/11/2012   Procedure: RETROSIGMOID CRANIECTOMY FOR TUMOR RESECTION;  Surgeon: [name], MD;  Location: [place] NEUR[facility] OR[facility];  Service: Neurosurgery;  Laterality: Right;  Craniotomy for acoustic neuroma  .  TRACHEOSTOMY TUBE PLACEMENT N/A 10/11/2012   Procedure: TRACHEOSTOMY;  Surgeon: Ascencion Dike, MD;  [facility];  Service: ENT;  Laterality: N/A;  . UMBILICAL HERNIA REPAIR     Past Medical History:  Diagnosis Date  . Brain tumor ([place])    Takes Topamax so patient will not have headaches  . Bruises easily   . CAD (coronary artery disease)    Cath 09, 99% LAD  . Cardiomyopathy, ischemic    Ischemic  . CHF (congestive heart failure) ([place])   . Cyst near coccyx   . Cyst near tailbone   . Diabetes mellitus without complication (HCC)    borderline  . Edema of both legs    Takes Lasix  . Fibromyalgia   . Gout   . Headache(784.0)    with lights  . Hypertension    dr Percival Spanish  . Kidney stones   . Myocardial infarction ([place])   . Obesity   . Pneumonia    hx of  . Shortness of breath   . Stroke [facility])    Affected vision, walking, and facial drooping on right face   There were no vitals taken for this visit.  Opioid Risk Score:  0 Fall Risk Score:  `1  Depression screen PHQ 2/9  Depression screen [facility] 2/9 09/10/2017 07/16/2017 05/14/2017 02/15/2015  Decreased Interest 1 0 0 0  Down, Depressed, Hopeless 1 0 0 0  PHQ - 2 Score 2 0 0 0  Altered sleeping - - - 0  Tired, decreased energy - - - 0  Change in appetite - - - 0  Feeling bad or failure about yourself  - - - 0  Trouble concentrating - - - 0  Moving slowly or fidgety/restless - - - 0  Suicidal thoughts - - - 0  PHQ-9 Score - - - 0    Review of Systems  Constitutional: Negative.   HENT: Negative.   Eyes: Negative.   Respiratory: Negative.   Cardiovascular: Positive for leg swelling.  Gastrointestinal: Positive for constipation.  Endocrine: Negative.   Genitourinary: Negative.   Musculoskeletal: Negative.   Skin: Negative.   Allergic/Immunologic: Negative.   Neurological: Negative.   Hematological: Negative.   Psychiatric/Behavioral: Negative.        Objective:   Physical Exam Constitutional:morbidly  obese HENT: Right I can more easily closed but cannot close completely.  His sclera is less irritated.  Visual acuity remains poor Head: Normocephalicand atraumatic.  Neck: Normal range of motion. Neck supple.  Cardiovascular:  Regular ratePulmonary/Chest: Clear to auscultation  Musculoskeletal: He exhibits edema. Neurological:strength nearly 4/5 to 5/5. Right eye limited with closure.  Right facial droop noted.  Does tend to still have some drooling.  Tongue deviates to the right.  Hearing limited on the  right side as well.  Attention issues and short-term memory issues remain. Gait slightly wide-based Skin: Skin is warmand dry.  Psychiatric: He has a normal mood and affect.  N .       Assessment & Plan:  1. 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 Frederico Hamman on . Refilled: Hydrocodone 10/325mg  one tablet every 6 hours as needed #120. second script for the following month. We will continue the opioid monitoring program, this consists of regular clinic visits, examinations, routine drug screening, pill counts as well as use of [place] Controlled Substance Reporting System. [place] today.      2. Headaches right temporal area related to tumor/surgery.  Continue Topamax 100 mg nightly.  I management per ophthalmology 3. Lower extremity edema, morbid obesity.Per primary.  4. Muscle Spasm: Continue Tizanidine.no refills required today 5. Peripheral Neuropathy:Resume Tegretol, 200 mg at night for 4 days then twice daily thereafter.  I told him to receive the most benefit from this medication he needs to take it on a scheduled basis.  15 minutes of face to face patient care time was spent during this visit. All questions were encouraged and answered. Follow up in  2 months with either me or my nurse practitioner

## 2017-09-10 NOTE — Patient Instructions (Signed)
PLEASE FEEL FREE TO CALL OUR OFFICE WITH ANY PROBLEMS OR QUESTIONS ([phone])      

## 2017-11-09 ENCOUNTER — Other Ambulatory Visit: Payer: Self-pay

## 2017-11-09 ENCOUNTER — Encounter: Payer: Medicare PPO | Attending: Physical Medicine & Rehabilitation | Admitting: Registered Nurse

## 2017-11-09 ENCOUNTER — Encounter: Payer: Self-pay | Admitting: Registered Nurse

## 2017-11-09 VITALS — BP 119/70 | HR 87

## 2017-11-09 DIAGNOSIS — Z5181 Encounter for therapeutic drug level monitoring: Secondary | ICD-10-CM | POA: Diagnosis not present

## 2017-11-09 DIAGNOSIS — G894 Chronic pain syndrome: Secondary | ICD-10-CM

## 2017-11-09 DIAGNOSIS — Z760 Encounter for issue of repeat prescription: Secondary | ICD-10-CM | POA: Diagnosis not present

## 2017-11-09 DIAGNOSIS — I252 Old myocardial infarction: Secondary | ICD-10-CM | POA: Diagnosis not present

## 2017-11-09 DIAGNOSIS — G629 Polyneuropathy, unspecified: Secondary | ICD-10-CM | POA: Diagnosis not present

## 2017-11-09 DIAGNOSIS — D333 Benign neoplasm of cranial nerves: Secondary | ICD-10-CM

## 2017-11-09 DIAGNOSIS — Z8673 Personal history of transient ischemic attack (TIA), and cerebral infarction without residual deficits: Secondary | ICD-10-CM | POA: Insufficient documentation

## 2017-11-09 DIAGNOSIS — G8929 Other chronic pain: Secondary | ICD-10-CM | POA: Diagnosis not present

## 2017-11-09 DIAGNOSIS — I2510 Atherosclerotic heart disease of native coronary artery without angina pectoris: Secondary | ICD-10-CM | POA: Insufficient documentation

## 2017-11-09 DIAGNOSIS — Z930 Tracheostomy status: Secondary | ICD-10-CM | POA: Diagnosis not present

## 2017-11-09 DIAGNOSIS — I255 Ischemic cardiomyopathy: Secondary | ICD-10-CM | POA: Diagnosis not present

## 2017-11-09 DIAGNOSIS — F17210 Nicotine dependence, cigarettes, uncomplicated: Secondary | ICD-10-CM | POA: Insufficient documentation

## 2017-11-09 DIAGNOSIS — I110 Hypertensive heart disease with heart failure: Secondary | ICD-10-CM | POA: Diagnosis not present

## 2017-11-09 DIAGNOSIS — R600 Localized edema: Secondary | ICD-10-CM | POA: Diagnosis not present

## 2017-11-09 DIAGNOSIS — E119 Type 2 diabetes mellitus without complications: Secondary | ICD-10-CM | POA: Diagnosis not present

## 2017-11-09 DIAGNOSIS — Z79899 Other long term (current) drug therapy: Secondary | ICD-10-CM | POA: Diagnosis not present

## 2017-11-09 DIAGNOSIS — Z8249 Family history of ischemic heart disease and other diseases of the circulatory system: Secondary | ICD-10-CM | POA: Insufficient documentation

## 2017-11-09 DIAGNOSIS — Z9889 Other specified postprocedural states: Secondary | ICD-10-CM | POA: Diagnosis not present

## 2017-11-09 DIAGNOSIS — R51 Headache: Secondary | ICD-10-CM | POA: Insufficient documentation

## 2017-11-09 DIAGNOSIS — G609 Hereditary and idiopathic neuropathy, unspecified: Secondary | ICD-10-CM

## 2017-11-09 DIAGNOSIS — Z951 Presence of aortocoronary bypass graft: Secondary | ICD-10-CM | POA: Diagnosis not present

## 2017-11-09 DIAGNOSIS — I509 Heart failure, unspecified: Secondary | ICD-10-CM | POA: Insufficient documentation

## 2017-11-09 DIAGNOSIS — M62838 Other muscle spasm: Secondary | ICD-10-CM | POA: Insufficient documentation

## 2017-11-09 DIAGNOSIS — M109 Gout, unspecified: Secondary | ICD-10-CM | POA: Insufficient documentation

## 2017-11-09 DIAGNOSIS — Z87442 Personal history of urinary calculi: Secondary | ICD-10-CM | POA: Diagnosis not present

## 2017-11-09 DIAGNOSIS — G510 Bell's palsy: Secondary | ICD-10-CM

## 2017-11-09 DIAGNOSIS — Z809 Family history of malignant neoplasm, unspecified: Secondary | ICD-10-CM | POA: Insufficient documentation

## 2017-11-09 DIAGNOSIS — M797 Fibromyalgia: Secondary | ICD-10-CM | POA: Insufficient documentation

## 2017-11-09 MED ORDER — HYDROCODONE-ACETAMINOPHEN 10-325 MG PO TABS: ORAL_TABLET | ORAL | 0 refills | Status: DC

## 2017-11-09 NOTE — Progress Notes (Signed)
Subjective:    Patient ID: Frank Pat., male    DOB: Mar 13, 1961, 57 y.o.   MRN: [id number]  HPI: [name] is a 57 year old male who returns for follow up appointmentfor chronic pain and medication refill. He reports right facial pain. He rates his pain 6.. His current exercise regime is walking.He's using his cadillac walker.   Last UDS was performed on 01/08/2017  Wife in room all questions answered.   Pain Inventory Average Pain 6 Pain Right Now 6 My pain is sharp and burning  In the last 24 hours, has pain interfered with the following? General activity 6 Relation with others 6 Enjoyment of life 6 What TIME of day is your pain at its worst? night Sleep (in general) Fair  Pain is worse with: standing Pain improves with: rest and medication Relief from Meds: 5  Mobility walk with assistance use a walker ability to climb steps?  no do you drive?  no  Function disabled: date disabled . I need assistance with the following:  dressing, bathing, meal prep, household duties and shopping  Neuro/Psych bladder control problems bowel control problems trouble walking spasms loss of taste or smell  Prior Studies Any changes since last visit?  no  Physicians involved in your care Any changes since last visit?  no   Family History  Problem Relation Age of Onset  . CAD Father   . Hypertension Mother   . Cancer Other        multiple relatives   Social History   Socioeconomic History  . Marital status: Married    Spouse name: Not on file  . Number of children: Not on file  . Years of education: Not on file  . Highest education level: Not on file  Occupational History  . Not on file  Social Needs  . Financial resource strain: Not on file  . Food insecurity:    Worry: Not on file    Inability: Not on file  . Transportation needs:    Medical: Not on file    Non-medical: Not on file  Tobacco Use  . Smoking status: Light Tobacco  Smoker    Packs/day: 0.25    Years: 20.00    Pack years: 5.00    Types: Cigarettes  . Smokeless tobacco: Current User  . Tobacco comment: Rare tobacco.  "[name]".  Substance and Sexual Activity  . Alcohol use: No    Alcohol/week: 0.0 oz  . Drug use: No  . Sexual activity: Yes  Lifestyle  . Physical activity:    Days per week: Not on file    Minutes per session: Not on file  . Stress: Not on file  Relationships  . Social connections:    Talks on phone: Not on file    Gets together: Not on file    Attends religious service: Not on file    Active member of club or organization: Not on file    Attends meetings of clubs or organizations: Not on file    Relationship status: Not on file  Other Topics Concern  . Not on file  Social History Narrative  . Not on file   Past Surgical History:  Procedure Laterality Date  . 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 ANAL FISTULECTOMY    . CORONARY  ARTERY BYPASS GRAFT     LIMA to LAD 2009  . CRANIOTOMY Right 11/20/2012   Procedure: CRANIOTOMY REPAIR DURAL/CENTRAL SPINAL FLUID LEAK;  Surgeon: [name], MD;  Location: [place] NEURO ORS;  Service: Neurosurgery;  Laterality: Right;  . EYE SURGERY     to coorect lid and double vision  . MEDIAN RECTUS REPAIR Right 02/16/2016   Procedure: RIGHT LATERAL RECTUS RESECTION; SUPERIOR RECTUS RESECTION RIGHT EYE; RIGHT SUPERIOR RECTUS RECESSION; LATERAL TARSAL STRIP RIGHT LOWER EYELID;  Surgeon: [name], MD;  Location: [place];  Service: Ophthalmology;  Laterality: Right;  . PLACEMENT OF LUMBAR DRAIN N/A 11/20/2012   Procedure: Attempted PLACEMENT OF LUMBAR DRAIN;  Surgeon: [name], MD;  Location: [place] NEURO ORS;  Service: Neurosurgery;  Laterality: N/A;  . RETROSIGMOID CRANIECTOMY FOR TUMOR RESECTION Right 10/11/2012   Procedure: RETROSIGMOID CRANIECTOMY FOR  TUMOR RESECTION;  Surgeon: [name], MD;  Location: [place] NEURO ORS;  Service: Neurosurgery;  Laterality: Right;  Craniotomy for acoustic neuroma  . TRACHEOSTOMY TUBE PLACEMENT N/A 10/11/2012   Procedure: TRACHEOSTOMY;  Surgeon: Ascencion Dike, MD;  [facility];  Service: ENT;  Laterality: N/A;  . UMBILICAL HERNIA REPAIR     Past Medical History:  Diagnosis Date  . Brain tumor ([place])    Takes Topamax so patient will not have headaches  . Bruises easily   . CAD (coronary artery disease)    Cath 09, 99% LAD  . Cardiomyopathy, ischemic    Ischemic  . CHF (congestive heart failure) ([place])   . Cyst near coccyx   . Cyst near tailbone   . Diabetes mellitus without complication (HCC)    borderline  . Edema of both legs    Takes Lasix  . Fibromyalgia   . Gout   . Headache(784.0)    with lights  . Hypertension    dr Percival Spanish  . Kidney stones   . Myocardial infarction ([place])   . Obesity   . Pneumonia    hx of  . Shortness of breath   . Stroke [facility])    Affected vision, walking, and facial drooping on right face   BP (!) 167/84   Pulse 87   SpO2 93% Comment: 2 L [place]  Opioid Risk Score:  0 Fall Risk Score:  `1  Depression screen PHQ 2/9  Depression screen [facility] 2/9 09/10/2017 07/16/2017 05/14/2017 02/15/2015  Decreased Interest 1 0 0 0  Down, Depressed, Hopeless 1 0 0 0  PHQ - 2 Score 2 0 0 0  Altered sleeping - - - 0  Tired, decreased energy - - - 0  Change in appetite - - - 0  Feeling bad or failure about yourself  - - - 0  Trouble concentrating - - - 0  Moving slowly or fidgety/restless - - - 0  Suicidal thoughts - - - 0  PHQ-9 Score - - - 0     Review of Systems  Constitutional: Positive for unexpected weight change.  HENT: Negative.   Eyes: Positive for discharge.  Respiratory: Negative.   Cardiovascular: Positive for leg swelling.       Limb swelling  Gastrointestinal: Positive for constipation.       Bowel control  Endocrine:       High and low blood  sugars  Genitourinary:       Bladder control  Musculoskeletal: Positive for gait problem.  Skin: Positive for rash.  Allergic/Immunologic: Negative.   Neurological:  Tingling  Hematological: Negative.   Psychiatric/Behavioral: Negative.   All other systems reviewed and are negative.      Objective:   Physical Exam  Constitutional: He is oriented to person, place, and time. He appears well-developed and well-nourished.  HENT:  Head: Normocephalic and atraumatic.  Neck: Normal range of motion. Neck supple.  Cardiovascular: Normal rate and regular rhythm.  Pulmonary/Chest: Effort normal and breath sounds normal.  Continuous Oxygen 2 liters nasal cannula  Musculoskeletal:  Normal Muscle Bulk and Muscle Testing Reveals: Upper Extremities: Full ROM and Muscle Strength 5/5 Lower Extremities: Decreased ROM and Muscle Strength 5/5 Arises from Table Slowly using walker for support Anasarca to lower extremities Wide Based gait   Neurological: He is alert and oriented to person, place, and time.  Skin: Skin is warm and dry.  Psychiatric: He has a normal mood and affect.  Nursing note and vitals reviewed.         Assessment & Plan:  1. 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 S/P RIGHT LATERAL RECTUS RESECTION; SUPERIOR RECTUS RESECTION RIGHT EYE; RIGHT SUPERIOR RECTUS RECESSION; LATERAL TARSAL STRIP RIGHT LOWER EYELID with Dr. Frederico Hamman on 02/16/16. Refilled: Hydrocodone 10/325mg  one tablet every 6 hours as needed #120. Second script e-scribe for the following month. 03//22/2019 We will continue the opioid monitoring program, this consists of regular clinic visits, examinations, urine drug screen, pill counts as well as use of [place] Controlled Substance Reporting System. Continue eye ointment as ordered. 2. Headaches right temporal area related to tumor/surgery. No complaints today. Continue  Topamax. 11/09/2017 3. Lower extremity edema, morbid obesity. Continue to Monitor. PCP Following. 11/09/2017 4. Muscle Spasm: Continue Tizanidine. 11/09/2017 5. Peripheral Neuropathy:Continue Topamax and Tegretol  Continue to Monitor. 11/09/2017   20 minutes of face to face patient care time was spent during this visit. All questions were encouraged and answered.  F/U in 2 months

## 2017-12-20 ENCOUNTER — Other Ambulatory Visit: Payer: Self-pay | Admitting: Physical Medicine & Rehabilitation

## 2017-12-20 DIAGNOSIS — G894 Chronic pain syndrome: Secondary | ICD-10-CM

## 2017-12-20 DIAGNOSIS — G939 Disorder of brain, unspecified: Secondary | ICD-10-CM

## 2017-12-20 DIAGNOSIS — R51 Headache: Secondary | ICD-10-CM

## 2017-12-20 DIAGNOSIS — G510 Bell's palsy: Secondary | ICD-10-CM

## 2018-01-11 ENCOUNTER — Ambulatory Visit: Payer: Medicare PPO | Admitting: Registered Nurse

## 2018-01-15 ENCOUNTER — Telehealth: Payer: Self-pay

## 2018-01-15 ENCOUNTER — Encounter: Payer: Self-pay | Admitting: Physical Medicine & Rehabilitation

## 2018-01-15 ENCOUNTER — Encounter: Payer: Medicare PPO | Attending: Physical Medicine & Rehabilitation | Admitting: Physical Medicine & Rehabilitation

## 2018-01-15 DIAGNOSIS — I509 Heart failure, unspecified: Secondary | ICD-10-CM | POA: Insufficient documentation

## 2018-01-15 DIAGNOSIS — G8929 Other chronic pain: Secondary | ICD-10-CM | POA: Diagnosis present

## 2018-01-15 DIAGNOSIS — G510 Bell's palsy: Secondary | ICD-10-CM | POA: Insufficient documentation

## 2018-01-15 DIAGNOSIS — M109 Gout, unspecified: Secondary | ICD-10-CM | POA: Diagnosis not present

## 2018-01-15 DIAGNOSIS — Z8673 Personal history of transient ischemic attack (TIA), and cerebral infarction without residual deficits: Secondary | ICD-10-CM | POA: Diagnosis not present

## 2018-01-15 DIAGNOSIS — R600 Localized edema: Secondary | ICD-10-CM | POA: Insufficient documentation

## 2018-01-15 DIAGNOSIS — G629 Polyneuropathy, unspecified: Secondary | ICD-10-CM | POA: Insufficient documentation

## 2018-01-15 DIAGNOSIS — I110 Hypertensive heart disease with heart failure: Secondary | ICD-10-CM | POA: Diagnosis not present

## 2018-01-15 DIAGNOSIS — Z930 Tracheostomy status: Secondary | ICD-10-CM | POA: Insufficient documentation

## 2018-01-15 DIAGNOSIS — Z87442 Personal history of urinary calculi: Secondary | ICD-10-CM | POA: Diagnosis not present

## 2018-01-15 DIAGNOSIS — I255 Ischemic cardiomyopathy: Secondary | ICD-10-CM | POA: Diagnosis not present

## 2018-01-15 DIAGNOSIS — Z760 Encounter for issue of repeat prescription: Secondary | ICD-10-CM | POA: Diagnosis not present

## 2018-01-15 DIAGNOSIS — D333 Benign neoplasm of cranial nerves: Secondary | ICD-10-CM | POA: Insufficient documentation

## 2018-01-15 DIAGNOSIS — E119 Type 2 diabetes mellitus without complications: Secondary | ICD-10-CM | POA: Diagnosis not present

## 2018-01-15 DIAGNOSIS — Z951 Presence of aortocoronary bypass graft: Secondary | ICD-10-CM | POA: Diagnosis not present

## 2018-01-15 DIAGNOSIS — R51 Headache: Secondary | ICD-10-CM | POA: Insufficient documentation

## 2018-01-15 DIAGNOSIS — I252 Old myocardial infarction: Secondary | ICD-10-CM | POA: Insufficient documentation

## 2018-01-15 DIAGNOSIS — M797 Fibromyalgia: Secondary | ICD-10-CM | POA: Insufficient documentation

## 2018-01-15 DIAGNOSIS — Z809 Family history of malignant neoplasm, unspecified: Secondary | ICD-10-CM | POA: Insufficient documentation

## 2018-01-15 DIAGNOSIS — F17210 Nicotine dependence, cigarettes, uncomplicated: Secondary | ICD-10-CM | POA: Insufficient documentation

## 2018-01-15 DIAGNOSIS — M62838 Other muscle spasm: Secondary | ICD-10-CM | POA: Insufficient documentation

## 2018-01-15 DIAGNOSIS — Z8249 Family history of ischemic heart disease and other diseases of the circulatory system: Secondary | ICD-10-CM | POA: Insufficient documentation

## 2018-01-15 DIAGNOSIS — Z9889 Other specified postprocedural states: Secondary | ICD-10-CM

## 2018-01-15 DIAGNOSIS — I2510 Atherosclerotic heart disease of native coronary artery without angina pectoris: Secondary | ICD-10-CM | POA: Insufficient documentation

## 2018-01-15 DIAGNOSIS — G939 Disorder of brain, unspecified: Secondary | ICD-10-CM | POA: Diagnosis not present

## 2018-01-15 DIAGNOSIS — G894 Chronic pain syndrome: Secondary | ICD-10-CM | POA: Diagnosis not present

## 2018-01-15 MED ORDER — TIZANIDINE HCL 4 MG PO TABS: ORAL_TABLET | ORAL | 2 refills | Status: DC

## 2018-01-15 MED ORDER — HYDROCODONE-ACETAMINOPHEN 10-325 MG PO TABS: ORAL_TABLET | ORAL | 0 refills | Status: DC

## 2018-01-15 MED ORDER — TOPIRAMATE 100 MG PO TABS: 100.0000 mg | ORAL_TABLET | Freq: Every day | ORAL | 5 refills | Status: DC

## 2018-01-15 NOTE — Progress Notes (Signed)
Subjective:    Patient ID: Frank Pat., male    DOB: September 03, 1960, 57 y.o.   MRN: [id number]  HPI   [name] is here in follow-up of his chronic pain.  He ran short on his hydrocodone prior to this visit and has been using more tizanidine in its place to get him by.  His last prescription was written on April 22.  He typically takes the hydrocodone 4 times daily.  He continues on Topamax for headaches as well as Tegretol for his neuropathic pain.  He is eye remains fairly stable.  He is on some ongoing drops from ophthalmology.  He is not wearing a high patch at night.     Pain Inventory Average Pain 7 Pain Right Now 7 My pain is burning and tingling  In the last 24 hours, has pain interfered with the following? General activity 7 Relation with others 7 Enjoyment of life 7 What TIME of day is your pain at its worst? night Sleep (in general) Fair  Pain is worse with: standing Pain improves with: rest Relief from Meds: 5  Mobility walk with assistance use a walker ability to climb steps?  no do you drive?  no needs help with transfers  Function retired  Neuro/Psych tingling trouble walking  Prior Studies Any changes since last visit?  no  Physicians involved in your care Any changes since last visit?  no   Family History  Problem Relation Age of Onset  . CAD Father   . Hypertension Mother   . Cancer Other        multiple relatives   Social History   Socioeconomic History  . Marital status: Married    Spouse name: Not on file  . Number of children: Not on file  . Years of education: Not on file  . Highest education level: Not on file  Occupational History  . Not on file  Social Needs  . Financial resource strain: Not on file  . Food insecurity:    Worry: Not on file    Inability: Not on file  . Transportation needs:    Medical: Not on file    Non-medical: Not on file  Tobacco Use  . Smoking status: Light Tobacco Smoker    Packs/day:  0.25    Years: 20.00    Pack years: 5.00    Types: Cigarettes  . Smokeless tobacco: Current User  . Tobacco comment: Rare tobacco.  "[name]".  Substance and Sexual Activity  . Alcohol use: No    Alcohol/week: 0.0 oz  . Drug use: No  . Sexual activity: Yes  Lifestyle  . Physical activity:    Days per week: Not on file    Minutes per session: Not on file  . Stress: Not on file  Relationships  . Social connections:    Talks on phone: Not on file    Gets together: Not on file    Attends religious service: Not on file    Active member of club or organization: Not on file    Attends meetings of clubs or organizations: Not on file    Relationship status: Not on file  Other Topics Concern  . Not on file  Social History Narrative  . Not on file   Past Surgical History:  Procedure Laterality Date  . 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 ANAL FISTULECTOMY    . CORONARY ARTERY BYPASS GRAFT     LIMA to LAD 2009  . CRANIOTOMY Right 11/20/2012   Procedure: CRANIOTOMY REPAIR DURAL/CENTRAL SPINAL FLUID LEAK;  Surgeon: [name], MD;  Location: [place] [facility];  Service: Neurosurgery;  Laterality: Right;  . EYE SURGERY     to coorect lid and double vision  . MEDIAN RECTUS REPAIR Right 02/16/2016   Procedure: RIGHT LATERAL RECTUS RESECTION; SUPERIOR RECTUS RESECTION RIGHT EYE; RIGHT SUPERIOR RECTUS RECESSION; LATERAL TARSAL STRIP RIGHT LOWER EYELID;  Surgeon: [name], MD;  Location: [place];  Service: Ophthalmology;  Laterality: Right;  . PLACEMENT OF LUMBAR DRAIN N/A 11/20/2012   Procedure: Attempted PLACEMENT OF LUMBAR DRAIN;  Surgeon: [name], MD;  Location: [place] [facility];  Service: Neurosurgery;  Laterality: N/A;  . RETROSIGMOID CRANIECTOMY FOR TUMOR RESECTION Right 10/11/2012   Procedure: RETROSIGMOID CRANIECTOMY FOR TUMOR RESECTION;   Surgeon: [name], MD;  Location: [place] [facility];  Service: Neurosurgery;  Laterality: Right;  Craniotomy for acoustic neuroma  . TRACHEOSTOMY TUBE PLACEMENT N/A 10/11/2012   Procedure: TRACHEOSTOMY;  Surgeon: Ascencion Dike, MD;  Location: [facility];  Service: ENT;  Laterality: N/A;  . UMBILICAL HERNIA REPAIR     Past Medical History:  Diagnosis Date  . Brain tumor ([place])    Takes Topamax so patient will not have headaches  . Bruises easily   . CAD (coronary artery disease)    Cath 09, 99% LAD  . Cardiomyopathy, ischemic    Ischemic  . CHF (congestive heart failure) ([place])   . Cyst near coccyx   . Cyst near tailbone   . Diabetes mellitus without complication (HCC)    borderline  . Edema of both legs    Takes Lasix  . Fibromyalgia   . Gout   . Headache(784.0)    with lights  . Hypertension    dr Percival Spanish  . Kidney stones   . Myocardial infarction ([place])   . Obesity   . Pneumonia    hx of  . Shortness of breath   . Stroke [facility])    Affected vision, walking, and facial drooping on right face   There were no vitals taken for this visit.  Opioid Risk Score:   Fall Risk Score:  `1  Depression screen PHQ 2/9  Depression screen [facility] 2/9 11/09/2017 09/10/2017 07/16/2017 05/14/2017 02/15/2015  Decreased Interest 1 1 0 0 0  Down, Depressed, Hopeless 1 1 0 0 0  PHQ - 2 Score 2 2 0 0 0  Altered sleeping - - - - 0  Tired, decreased energy - - - - 0  Change in appetite - - - - 0  Feeling bad or failure about yourself  - - - - 0  Trouble concentrating - - - - 0  Moving slowly or fidgety/restless - - - - 0  Suicidal thoughts - - - - 0  PHQ-9 Score - - - - 0   Review of Systems  Constitutional: Negative.   HENT: Negative.   Eyes: Negative.   Respiratory: Negative.   Cardiovascular: Negative.   Gastrointestinal: Positive for constipation.  Endocrine: Negative.   Genitourinary: Negative.   Musculoskeletal: Negative.   Skin: Negative.   Allergic/Immunologic: Negative.     Neurological: Negative.   Hematological: Negative.   Psychiatric/Behavioral: Negative.        Objective:   Physical Exam General: No acute distress. Wearing oxygen  via [place], obese HEENT: EOMI, oral membranes moist Cards: reg rate  Chest: normal effort Abdomen: Soft, NT, ND Skin: dry, intact Extremities: no edema Musculoskeletal: He exhibits edema. Neurological:strength nearly 4/5 to 5/5. Right eye limited with closure---can almost close.  Right facial droop noted.  Does tend to still have some drooling.  Tongue deviates to the right.  Hearing limited on the right side as well.  Attention issuesand short-term memory issues remain. Gait slightly wide-based Skin: Skin is warmand dry.  Psychiatric: He has a normal mood and affect.  N .       Assessment & Plan:  1. 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 Frederico Hamman on . Refilled: Hydrocodone 10/325mg  one tablet every 6 hours as needed #120. second script for the following month. We will continue the controlled substance monitoring program, this consists of regular clinic visits, examinations, routine drug screening, pill counts as well as use of [place] Controlled Substance Reporting System. [place] today.     2. Headaches right temporal area related to tumor/surgery.Continue Topamax 100 mg nightly.   -Ocular management per ophthalmology 3. Lower extremity edema, morbid obesity.Per primary.  4. Muscle Spasm: Continue Tizanidine.he may use it up to 4 times daily. 5. Peripheral Neuropathy:continue tegretol 200mg  bid  18minutes of face to face patient care time was spent during this visit. All questions were encouraged and answered. Follow up in2 months with either me or my nurse practitioner.

## 2018-01-15 NOTE — Telephone Encounter (Signed)
Pt called stating that his Hydrocodone was not at [facility]. I called pt back to let him know that the prescription went to [facility]. He states he doesn't deal with [facility] but he will pick the prescription up but [facility]. I deleted [facility] from preferred list.

## 2018-01-15 NOTE — Patient Instructions (Signed)
PLEASE FEEL FREE TO CALL OUR OFFICE WITH ANY PROBLEMS OR QUESTIONS ([phone])      

## 2018-02-18 ENCOUNTER — Telehealth: Payer: Self-pay

## 2018-02-18 DIAGNOSIS — Z9889 Other specified postprocedural states: Secondary | ICD-10-CM

## 2018-02-18 DIAGNOSIS — D333 Benign neoplasm of cranial nerves: Secondary | ICD-10-CM

## 2018-02-18 DIAGNOSIS — G894 Chronic pain syndrome: Secondary | ICD-10-CM

## 2018-02-18 DIAGNOSIS — G510 Bell's palsy: Secondary | ICD-10-CM

## 2018-02-18 MED ORDER — HYDROCODONE-ACETAMINOPHEN 10-325 MG PO TABS: ORAL_TABLET | ORAL | 0 refills | Status: DC

## 2018-02-18 NOTE — Telephone Encounter (Signed)
Hydrocodone e-scribe. According to PMP Aware Web-site last prescription of Hydrocodone picked up on 01/15/2018.

## 2018-02-18 NOTE — Telephone Encounter (Signed)
Patients wife reported has been unable to pick up patients 2nd prescription of hydrocodone from [place] due to the patients insurance refusing to recognize Dr. Naaman Plummer DEA number.  Patient stated also that [place] was the wrong location to begin with and asked if [facility] [address] due   [employer] and cancelled last order of hydrocodone-apap 10-325 mg.

## 2018-02-19 ENCOUNTER — Telehealth: Payer: Self-pay | Admitting: Physical Medicine & Rehabilitation

## 2018-02-19 NOTE — Telephone Encounter (Signed)
Call [place] to me from clinical staff regarding change to hydrocod. refille [name] is denying [name] and needs ET [facility] [address] of [facility]-- message left 07.01.19 on my voicemail

## 2018-02-19 NOTE — Telephone Encounter (Signed)
Patients wife came to window yesterday for same issue, was brought to the attention of Zella Ball who was able to help the patient by having his medication moved [facility] and wrote for a new prescription which the patients wife was able to pick up for the patient.

## 2018-03-18 ENCOUNTER — Encounter: Payer: Medicare PPO | Attending: Physical Medicine & Rehabilitation | Admitting: Registered Nurse

## 2018-03-18 ENCOUNTER — Encounter: Payer: Self-pay | Admitting: Registered Nurse

## 2018-03-18 VITALS — BP 124/85 | HR 97 | Ht 70.0 in | Wt 380.0 lb

## 2018-03-18 DIAGNOSIS — G609 Hereditary and idiopathic neuropathy, unspecified: Secondary | ICD-10-CM

## 2018-03-18 DIAGNOSIS — E119 Type 2 diabetes mellitus without complications: Secondary | ICD-10-CM | POA: Insufficient documentation

## 2018-03-18 DIAGNOSIS — M62838 Other muscle spasm: Secondary | ICD-10-CM | POA: Insufficient documentation

## 2018-03-18 DIAGNOSIS — D333 Benign neoplasm of cranial nerves: Secondary | ICD-10-CM | POA: Insufficient documentation

## 2018-03-18 DIAGNOSIS — I252 Old myocardial infarction: Secondary | ICD-10-CM | POA: Diagnosis not present

## 2018-03-18 DIAGNOSIS — Z951 Presence of aortocoronary bypass graft: Secondary | ICD-10-CM | POA: Insufficient documentation

## 2018-03-18 DIAGNOSIS — G510 Bell's palsy: Secondary | ICD-10-CM | POA: Diagnosis not present

## 2018-03-18 DIAGNOSIS — Z8673 Personal history of transient ischemic attack (TIA), and cerebral infarction without residual deficits: Secondary | ICD-10-CM | POA: Insufficient documentation

## 2018-03-18 DIAGNOSIS — Z5181 Encounter for therapeutic drug level monitoring: Secondary | ICD-10-CM | POA: Diagnosis not present

## 2018-03-18 DIAGNOSIS — G8929 Other chronic pain: Secondary | ICD-10-CM | POA: Diagnosis present

## 2018-03-18 DIAGNOSIS — Z809 Family history of malignant neoplasm, unspecified: Secondary | ICD-10-CM | POA: Insufficient documentation

## 2018-03-18 DIAGNOSIS — Z8249 Family history of ischemic heart disease and other diseases of the circulatory system: Secondary | ICD-10-CM | POA: Insufficient documentation

## 2018-03-18 DIAGNOSIS — Z760 Encounter for issue of repeat prescription: Secondary | ICD-10-CM | POA: Diagnosis not present

## 2018-03-18 DIAGNOSIS — Z9889 Other specified postprocedural states: Secondary | ICD-10-CM | POA: Diagnosis not present

## 2018-03-18 DIAGNOSIS — M797 Fibromyalgia: Secondary | ICD-10-CM | POA: Diagnosis not present

## 2018-03-18 DIAGNOSIS — R600 Localized edema: Secondary | ICD-10-CM | POA: Diagnosis not present

## 2018-03-18 DIAGNOSIS — F17210 Nicotine dependence, cigarettes, uncomplicated: Secondary | ICD-10-CM | POA: Insufficient documentation

## 2018-03-18 DIAGNOSIS — G629 Polyneuropathy, unspecified: Secondary | ICD-10-CM | POA: Diagnosis not present

## 2018-03-18 DIAGNOSIS — I2510 Atherosclerotic heart disease of native coronary artery without angina pectoris: Secondary | ICD-10-CM | POA: Insufficient documentation

## 2018-03-18 DIAGNOSIS — Z87442 Personal history of urinary calculi: Secondary | ICD-10-CM | POA: Diagnosis not present

## 2018-03-18 DIAGNOSIS — I509 Heart failure, unspecified: Secondary | ICD-10-CM | POA: Diagnosis not present

## 2018-03-18 DIAGNOSIS — Z930 Tracheostomy status: Secondary | ICD-10-CM | POA: Diagnosis not present

## 2018-03-18 DIAGNOSIS — I255 Ischemic cardiomyopathy: Secondary | ICD-10-CM | POA: Insufficient documentation

## 2018-03-18 DIAGNOSIS — R51 Headache: Secondary | ICD-10-CM | POA: Diagnosis not present

## 2018-03-18 DIAGNOSIS — M109 Gout, unspecified: Secondary | ICD-10-CM | POA: Insufficient documentation

## 2018-03-18 DIAGNOSIS — Z79899 Other long term (current) drug therapy: Secondary | ICD-10-CM

## 2018-03-18 DIAGNOSIS — I110 Hypertensive heart disease with heart failure: Secondary | ICD-10-CM | POA: Insufficient documentation

## 2018-03-18 DIAGNOSIS — G894 Chronic pain syndrome: Secondary | ICD-10-CM

## 2018-03-18 MED ORDER — HYDROCODONE-ACETAMINOPHEN 10-325 MG PO TABS: ORAL_TABLET | ORAL | 0 refills | Status: DC

## 2018-03-18 NOTE — Progress Notes (Signed)
Subjective:    Patient ID: Frank Pat., male    DOB: 08-10-1961, 57 y.o.   MRN: [id number]  HPI: [name] is a 57 year old  Male who returns for follow up appointment for chronic pain and medication refill. He states his pain is located in his right eye and right facial numbness. He rates his pain 7. His current exercise regime is walking.   [name] Morphine Equivalent is 40.00 MME. Last UDS was Performed on 01/08/2017, it was consistent. UDS ordered today.   [name] in room.   Pain Inventory Average Pain 7 Pain Right Now 7 My pain is sharp  In the last 24 hours, has pain interfered with the following? General activity 7 Relation with others 7 Enjoyment of life 6 What TIME of day is your pain at its worst? na Sleep (in general) Fair  Pain is worse with: walking and standing Pain improves with: rest Relief from Meds: 5  Mobility walk with assistance use a walker ability to climb steps?  no do you drive?  no  Function retired  Neuro/Psych No problems in this area  Prior Studies Any changes since last visit?  no  Physicians involved in your care Any changes since last visit?  no   Family History  Problem Relation Age of Onset  . CAD Father   . Hypertension Mother   . Cancer Other        multiple relatives   Social History   Socioeconomic History  . Marital status: Married    Spouse name: Not on file  . Number of children: Not on file  . Years of education: Not on file  . Highest education level: Not on file  Occupational History  . Not on file  Social Needs  . Financial resource strain: Not on file  . Food insecurity:    Worry: Not on file    Inability: Not on file  . Transportation needs:    Medical: Not on file    Non-medical: Not on file  Tobacco Use  . Smoking status: Light Tobacco Smoker    Packs/day: 0.25    Years: 20.00    Pack years: 5.00    Types: Cigarettes  . Smokeless tobacco: Current User  . Tobacco  comment: Rare tobacco.  "[name]".  Substance and Sexual Activity  . Alcohol use: No    Alcohol/week: 0.0 oz  . Drug use: No  . Sexual activity: Yes  Lifestyle  . Physical activity:    Days per week: Not on file    Minutes per session: Not on file  . Stress: Not on file  Relationships  . Social connections:    Talks on phone: Not on file    Gets together: Not on file    Attends religious service: Not on file    Active member of club or organization: Not on file    Attends meetings of clubs or organizations: Not on file    Relationship status: Not on file  Other Topics Concern  . Not on file  Social History Narrative  . Not on file   Past Surgical History:  Procedure Laterality Date  . 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 ANAL FISTULECTOMY    . CORONARY ARTERY BYPASS GRAFT     LIMA to LAD 2009  .  CRANIOTOMY Right 11/20/2012   Procedure: CRANIOTOMY REPAIR DURAL/CENTRAL SPINAL FLUID LEAK;  Surgeon: [name], MD;  Location: [place] [facility];  Service: Neurosurgery;  Laterality: Right;  . EYE SURGERY     to coorect lid and double vision  . MEDIAN RECTUS REPAIR Right 02/16/2016   Procedure: RIGHT LATERAL RECTUS RESECTION; SUPERIOR RECTUS RESECTION RIGHT EYE; RIGHT SUPERIOR RECTUS RECESSION; LATERAL TARSAL STRIP RIGHT LOWER EYELID;  Surgeon: [name], MD;  Location: [place];  Service: Ophthalmology;  Laterality: Right;  . PLACEMENT OF LUMBAR DRAIN N/A 11/20/2012   Procedure: Attempted PLACEMENT OF LUMBAR DRAIN;  Surgeon: [name], MD;  Location: [place] [facility];  Service: Neurosurgery;  Laterality: N/A;  . RETROSIGMOID CRANIECTOMY FOR TUMOR RESECTION Right 10/11/2012   Procedure: RETROSIGMOID CRANIECTOMY FOR TUMOR RESECTION;  Surgeon: [name], MD;  Location: [place] [facility];  Service: Neurosurgery;  Laterality: Right;  Craniotomy for  acoustic neuroma  . TRACHEOSTOMY TUBE PLACEMENT N/A 10/11/2012   Procedure: TRACHEOSTOMY;  Surgeon: Ascencion Dike, MD;  [facility];  Service: ENT;  Laterality: N/A;  . UMBILICAL HERNIA REPAIR     Past Medical History:  Diagnosis Date  . Brain tumor ([place])    Takes Topamax so patient will not have headaches  . Bruises easily   . CAD (coronary artery disease)    Cath 09, 99% LAD  . Cardiomyopathy, ischemic    Ischemic  . CHF (congestive heart failure) ([place])   . Cyst near coccyx   . Cyst near tailbone   . Diabetes mellitus without complication (HCC)    borderline  . Edema of both legs    Takes Lasix  . Fibromyalgia   . Gout   . Headache(784.0)    with lights  . Hypertension    dr Percival Spanish  . Kidney stones   . Myocardial infarction ([place])   . Obesity   . Pneumonia    hx of  . Shortness of breath   . Stroke [facility])    Affected vision, walking, and facial drooping on right face   BP 124/85   Pulse 97   Ht 5\' 10"  (1.778 m)   Wt (!) 380 lb (172.4 kg)   SpO2 95%   BMI 54.52 kg/m   Opioid Risk Score:   Fall Risk Score:  `1  Depression screen PHQ 2/9  Depression screen [facility] 2/9 11/09/2017 09/10/2017 07/16/2017 05/14/2017 02/15/2015  Decreased Interest 1 1 0 0 0  Down, Depressed, Hopeless 1 1 0 0 0  PHQ - 2 Score 2 2 0 0 0  Altered sleeping - - - - 0  Tired, decreased energy - - - - 0  Change in appetite - - - - 0  Feeling bad or failure about yourself  - - - - 0  Trouble concentrating - - - - 0  Moving slowly or fidgety/restless - - - - 0  Suicidal thoughts - - - - 0  PHQ-9 Score - - - - 0     Review of Systems  Constitutional: Negative.   HENT: Negative.   Eyes: Positive for pain and redness.  Respiratory: Negative.   Cardiovascular: Negative.   Gastrointestinal: Positive for constipation.  Endocrine: Negative.   Genitourinary: Negative.   Musculoskeletal: Positive for gait problem and myalgias.  Skin: Negative.   Allergic/Immunologic: Negative.     Neurological: Positive for facial asymmetry.  Hematological: Negative.   Psychiatric/Behavioral: Negative.   All other systems reviewed and are negative.  Objective:   Physical Exam  Constitutional: He is oriented to person, place, and time. He appears well-developed and well-nourished.  HENT:  Head: Normocephalic and atraumatic.  Neck: Normal range of motion. Neck supple.  Cardiovascular: Normal rate and regular rhythm.  Pulmonary/Chest: Effort normal and breath sounds normal.  Musculoskeletal:  Normal Muscle Bulk and Muscle Testing Reveals: Upper Extremities: : Full ROM and Muscle Strength 5/5 Lower Extremities: Full ROM and Muscle Strength 5/5 Arises from Table Slowly using walker for support Antalgic gait    Neurological: He is alert and oriented to person, place, and time.  Nursing note and vitals reviewed.         Assessment & Plan:  1. 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 S/P RIGHT LATERAL RECTUS RESECTION; SUPERIOR RECTUS RESECTION RIGHT EYE; RIGHT SUPERIOR RECTUS RECESSION; LATERAL TARSAL STRIP RIGHT LOWER EYELID with Dr. Frederico Hamman on 02/16/16. Refilled: Hydrocodone 10/325mg  one tablet every 6 hours as needed #120. Second script e-scribe for the following month. 07//29/2019 We will continue the opioid monitoring program, this consists of regular clinic visits, examinations, urine drug screen, pill counts as well as use of [place] Controlled Substance Reporting System. Continue eye ointment as ordered. 2. Headaches right temporal area related to tumor/surgery. No complaints today. Continue Topamax. 03/18/2018 3. Lower extremity edema, morbid obesity. Continue to Monitor. PCP Following. 03/18/2018 4. Muscle Spasm: Continue Tizanidine. 03/18/2018 5. Peripheral Neuropathy:Continue Topamax and Tegretol  Continue to Monitor. 03/18/2018   20 minutes of face to face patient care  time was spent during this visit. All questions were encouraged and answered.  F/U in 2 months

## 2018-03-25 LAB — TOXASSURE SELECT,+ANTIDEPR,UR

## 2018-03-28 ENCOUNTER — Telehealth: Payer: Self-pay | Admitting: *Deleted

## 2018-03-28 NOTE — Telephone Encounter (Signed)
Urine drug screen for this encounter is consistent for prescribed medication 

## 2018-04-05 ENCOUNTER — Other Ambulatory Visit: Payer: Self-pay | Admitting: Physical Medicine & Rehabilitation

## 2018-04-05 DIAGNOSIS — B49 Unspecified mycosis: Secondary | ICD-10-CM

## 2018-04-08 ENCOUNTER — Other Ambulatory Visit: Payer: Self-pay | Admitting: *Deleted

## 2018-04-08 DIAGNOSIS — B49 Unspecified mycosis: Secondary | ICD-10-CM

## 2018-04-08 MED ORDER — NYSTATIN 100000 UNIT/GM EX POWD: CUTANEOUS | 3 refills | Status: DC

## 2018-04-30 ENCOUNTER — Inpatient Hospital Stay (HOSPITAL_COMMUNITY)
Admission: EM | Admit: 2018-04-30 | Discharge: 2018-05-10 | DRG: 291 | Disposition: A | Discharge status: Skilled Nursing Facility | Payer: Medicare PPO | Attending: Internal Medicine | Admitting: Internal Medicine

## 2018-04-30 ENCOUNTER — Encounter (HOSPITAL_COMMUNITY): Payer: Self-pay

## 2018-04-30 ENCOUNTER — Emergency Department (HOSPITAL_COMMUNITY): Payer: Medicare PPO

## 2018-04-30 ENCOUNTER — Other Ambulatory Visit: Payer: Self-pay

## 2018-04-30 DIAGNOSIS — Z86011 Personal history of benign neoplasm of the brain: Secondary | ICD-10-CM

## 2018-04-30 DIAGNOSIS — R1011 Right upper quadrant pain: Secondary | ICD-10-CM

## 2018-04-30 DIAGNOSIS — Z888 Allergy status to other drugs, medicaments and biological substances status: Secondary | ICD-10-CM

## 2018-04-30 DIAGNOSIS — G9341 Metabolic encephalopathy: Secondary | ICD-10-CM | POA: Diagnosis present

## 2018-04-30 DIAGNOSIS — R269 Unspecified abnormalities of gait and mobility: Secondary | ICD-10-CM | POA: Diagnosis present

## 2018-04-30 DIAGNOSIS — I69392 Facial weakness following cerebral infarction: Secondary | ICD-10-CM

## 2018-04-30 DIAGNOSIS — I878 Other specified disorders of veins: Secondary | ICD-10-CM | POA: Diagnosis present

## 2018-04-30 DIAGNOSIS — Z7901 Long term (current) use of anticoagulants: Secondary | ICD-10-CM

## 2018-04-30 DIAGNOSIS — G8929 Other chronic pain: Secondary | ICD-10-CM | POA: Diagnosis present

## 2018-04-30 DIAGNOSIS — I272 Pulmonary hypertension, unspecified: Secondary | ICD-10-CM | POA: Diagnosis present

## 2018-04-30 DIAGNOSIS — M546 Pain in thoracic spine: Secondary | ICD-10-CM | POA: Diagnosis present

## 2018-04-30 DIAGNOSIS — I4891 Unspecified atrial fibrillation: Secondary | ICD-10-CM | POA: Diagnosis present

## 2018-04-30 DIAGNOSIS — Z885 Allergy status to narcotic agent status: Secondary | ICD-10-CM

## 2018-04-30 DIAGNOSIS — Z5320 Procedure and treatment not carried out because of patient's decision for unspecified reasons: Secondary | ICD-10-CM | POA: Diagnosis present

## 2018-04-30 DIAGNOSIS — E1142 Type 2 diabetes mellitus with diabetic polyneuropathy: Secondary | ICD-10-CM | POA: Diagnosis present

## 2018-04-30 DIAGNOSIS — F17210 Nicotine dependence, cigarettes, uncomplicated: Secondary | ICD-10-CM | POA: Diagnosis present

## 2018-04-30 DIAGNOSIS — I2510 Atherosclerotic heart disease of native coronary artery without angina pectoris: Secondary | ICD-10-CM | POA: Diagnosis present

## 2018-04-30 DIAGNOSIS — I252 Old myocardial infarction: Secondary | ICD-10-CM

## 2018-04-30 DIAGNOSIS — Z91041 Radiographic dye allergy status: Secondary | ICD-10-CM

## 2018-04-30 DIAGNOSIS — I959 Hypotension, unspecified: Secondary | ICD-10-CM | POA: Diagnosis not present

## 2018-04-30 DIAGNOSIS — K5900 Constipation, unspecified: Secondary | ICD-10-CM | POA: Diagnosis not present

## 2018-04-30 DIAGNOSIS — L89899 Pressure ulcer of other site, unspecified stage: Secondary | ICD-10-CM | POA: Diagnosis present

## 2018-04-30 DIAGNOSIS — Z23 Encounter for immunization: Secondary | ICD-10-CM

## 2018-04-30 DIAGNOSIS — H5461 Unqualified visual loss, right eye, normal vision left eye: Secondary | ICD-10-CM | POA: Diagnosis present

## 2018-04-30 DIAGNOSIS — I482 Chronic atrial fibrillation: Secondary | ICD-10-CM | POA: Diagnosis present

## 2018-04-30 DIAGNOSIS — I130 Hypertensive heart and chronic kidney disease with heart failure and stage 1 through stage 4 chronic kidney disease, or unspecified chronic kidney disease: Principal | ICD-10-CM | POA: Diagnosis present

## 2018-04-30 DIAGNOSIS — Z79899 Other long term (current) drug therapy: Secondary | ICD-10-CM

## 2018-04-30 DIAGNOSIS — Z951 Presence of aortocoronary bypass graft: Secondary | ICD-10-CM

## 2018-04-30 DIAGNOSIS — Z7984 Long term (current) use of oral hypoglycemic drugs: Secondary | ICD-10-CM

## 2018-04-30 DIAGNOSIS — R748 Abnormal levels of other serum enzymes: Secondary | ICD-10-CM | POA: Diagnosis present

## 2018-04-30 DIAGNOSIS — R400 Somnolence: Secondary | ICD-10-CM

## 2018-04-30 DIAGNOSIS — E876 Hypokalemia: Secondary | ICD-10-CM | POA: Diagnosis not present

## 2018-04-30 DIAGNOSIS — N179 Acute kidney failure, unspecified: Secondary | ICD-10-CM | POA: Diagnosis present

## 2018-04-30 DIAGNOSIS — E662 Morbid (severe) obesity with alveolar hypoventilation: Secondary | ICD-10-CM | POA: Diagnosis present

## 2018-04-30 DIAGNOSIS — M79672 Pain in left foot: Secondary | ICD-10-CM | POA: Diagnosis not present

## 2018-04-30 DIAGNOSIS — Z6841 Body Mass Index (BMI) 40.0 and over, adult: Secondary | ICD-10-CM

## 2018-04-30 DIAGNOSIS — N183 Chronic kidney disease, stage 3 (moderate): Secondary | ICD-10-CM | POA: Diagnosis present

## 2018-04-30 DIAGNOSIS — Z86718 Personal history of other venous thrombosis and embolism: Secondary | ICD-10-CM

## 2018-04-30 DIAGNOSIS — E1122 Type 2 diabetes mellitus with diabetic chronic kidney disease: Secondary | ICD-10-CM | POA: Diagnosis present

## 2018-04-30 DIAGNOSIS — K760 Fatty (change of) liver, not elsewhere classified: Secondary | ICD-10-CM | POA: Diagnosis present

## 2018-04-30 DIAGNOSIS — R8271 Bacteriuria: Secondary | ICD-10-CM | POA: Diagnosis present

## 2018-04-30 DIAGNOSIS — E1165 Type 2 diabetes mellitus with hyperglycemia: Secondary | ICD-10-CM | POA: Diagnosis present

## 2018-04-30 DIAGNOSIS — E874 Mixed disorder of acid-base balance: Secondary | ICD-10-CM | POA: Diagnosis present

## 2018-04-30 DIAGNOSIS — R000 Tachycardia, unspecified: Secondary | ICD-10-CM | POA: Diagnosis not present

## 2018-04-30 DIAGNOSIS — R0689 Other abnormalities of breathing: Secondary | ICD-10-CM

## 2018-04-30 DIAGNOSIS — K219 Gastro-esophageal reflux disease without esophagitis: Secondary | ICD-10-CM | POA: Diagnosis present

## 2018-04-30 DIAGNOSIS — I5043 Acute on chronic combined systolic (congestive) and diastolic (congestive) heart failure: Secondary | ICD-10-CM | POA: Diagnosis present

## 2018-04-30 DIAGNOSIS — Z9981 Dependence on supplemental oxygen: Secondary | ICD-10-CM

## 2018-04-30 DIAGNOSIS — M797 Fibromyalgia: Secondary | ICD-10-CM | POA: Diagnosis present

## 2018-04-30 DIAGNOSIS — I5031 Acute diastolic (congestive) heart failure: Secondary | ICD-10-CM | POA: Diagnosis present

## 2018-04-30 DIAGNOSIS — L8990 Pressure ulcer of unspecified site, unspecified stage: Secondary | ICD-10-CM | POA: Diagnosis present

## 2018-04-30 DIAGNOSIS — E0580 Other thyrotoxicosis without thyrotoxic crisis or storm: Secondary | ICD-10-CM | POA: Diagnosis present

## 2018-04-30 DIAGNOSIS — M109 Gout, unspecified: Secondary | ICD-10-CM | POA: Diagnosis present

## 2018-04-30 DIAGNOSIS — J9622 Acute and chronic respiratory failure with hypercapnia: Secondary | ICD-10-CM | POA: Diagnosis present

## 2018-04-30 DIAGNOSIS — Z8249 Family history of ischemic heart disease and other diseases of the circulatory system: Secondary | ICD-10-CM

## 2018-04-30 DIAGNOSIS — K8020 Calculus of gallbladder without cholecystitis without obstruction: Secondary | ICD-10-CM | POA: Diagnosis present

## 2018-04-30 DIAGNOSIS — Z79891 Long term (current) use of opiate analgesic: Secondary | ICD-10-CM

## 2018-04-30 DIAGNOSIS — I255 Ischemic cardiomyopathy: Secondary | ICD-10-CM | POA: Diagnosis present

## 2018-04-30 LAB — COMPREHENSIVE METABOLIC PANEL
ALT: 74 U/L — AB (ref 0–44)
AST: 57 U/L — AB (ref 15–41)
Albumin: 2.9 g/dL — ABNORMAL LOW (ref 3.5–5.0)
Alkaline Phosphatase: 258 U/L — ABNORMAL HIGH (ref 38–126)
Anion gap: 13 (ref 5–15)
BUN: 43 mg/dL — AB (ref 6–20)
CHLORIDE: 91 mmol/L — AB (ref 98–111)
CO2: 29 mmol/L (ref 22–32)
Calcium: 8.8 mg/dL — ABNORMAL LOW (ref 8.9–10.3)
Creatinine, Ser: 4.19 mg/dL — ABNORMAL HIGH (ref 0.61–1.24)
GFR calc Af Amer: 17 mL/min — ABNORMAL LOW (ref >60)
GFR, EST NON AFRICAN AMERICAN: 14 mL/min — AB (ref >60)
GLUCOSE: 483 mg/dL — AB (ref 70–99)
Potassium: 4.3 mmol/L (ref 3.5–5.1)
Sodium: 133 mmol/L — ABNORMAL LOW (ref 135–145)
Total Bilirubin: 0.8 mg/dL (ref 0.3–1.2)
Total Protein: 7.6 g/dL (ref 6.5–8.1)

## 2018-04-30 LAB — CBC
HCT: 47.5 % (ref 39.0–52.0)
Hemoglobin: 14 g/dL (ref 13.0–17.0)
MCH: 31.5 pg (ref 26.0–34.0)
MCHC: 29.5 g/dL — AB (ref 30.0–36.0)
MCV: 107 fL — AB (ref 78.0–100.0)
PLATELETS: 179 10*3/uL (ref 150–400)
RBC: 4.44 MIL/uL (ref 4.22–5.81)
RDW: 12.5 % (ref 11.5–15.5)
WBC: 12.3 10*3/uL — AB (ref 4.0–10.5)

## 2018-04-30 LAB — URINALYSIS, ROUTINE W REFLEX MICROSCOPIC
BILIRUBIN URINE: NEGATIVE
Glucose, UA: >=500 mg/dL — AB
KETONES UR: NEGATIVE mg/dL
NITRITE: NEGATIVE
PROTEIN: 30 mg/dL — AB
SPECIFIC GRAVITY, URINE: 1.015 (ref 1.005–1.030)
pH: 5 (ref 5.0–8.0)

## 2018-04-30 LAB — RAPID URINE DRUG SCREEN, HOSP PERFORMED
Amphetamines: NOT DETECTED
BARBITURATES: NOT DETECTED
Benzodiazepines: NOT DETECTED
Cocaine: NOT DETECTED
Opiates: POSITIVE — AB
TETRAHYDROCANNABINOL: NOT DETECTED

## 2018-04-30 LAB — I-STAT CG4 LACTIC ACID, ED: LACTIC ACID, VENOUS: 2.07 mmol/L — AB (ref 0.5–1.9)

## 2018-04-30 LAB — TSH: TSH: 0.281 u[IU]/mL — AB (ref 0.350–4.500)

## 2018-04-30 LAB — CK: CK TOTAL: 32 U/L — AB (ref 49–397)

## 2018-04-30 LAB — AMMONIA: Ammonia: 50 umol/L — ABNORMAL HIGH (ref 9–35)

## 2018-04-30 LAB — I-STAT TROPONIN, ED: TROPONIN I, POC: 0.13 ng/mL — AB (ref 0.00–0.08)

## 2018-04-30 LAB — MAGNESIUM: MAGNESIUM: 2.4 mg/dL (ref 1.7–2.4)

## 2018-04-30 LAB — CBG MONITORING, ED: Glucose-Capillary: 470 mg/dL — ABNORMAL HIGH (ref 70–99)

## 2018-04-30 LAB — T4, FREE: FREE T4: 0.92 ng/dL (ref 0.82–1.77)

## 2018-04-30 LAB — BRAIN NATRIURETIC PEPTIDE: B Natriuretic Peptide: 557.2 pg/mL — ABNORMAL HIGH (ref 0.0–100.0)

## 2018-04-30 MED ORDER — INSULIN ASPART 100 UNIT/ML ~~LOC~~ SOLN
10.0000 [IU] | Freq: Once | SUBCUTANEOUS | Status: AC
  Administered 2018-04-30: 10 [IU] via SUBCUTANEOUS
  Filled 2018-04-30: qty 1

## 2018-04-30 MED ORDER — IPRATROPIUM-ALBUTEROL 0.5-2.5 (3) MG/3ML IN SOLN
3.0000 mL | Freq: Once | RESPIRATORY_TRACT | Status: AC
  Administered 2018-04-30: 3 mL via RESPIRATORY_TRACT
  Filled 2018-04-30: qty 3

## 2018-04-30 NOTE — ED Notes (Signed)
Patient transported to CT 

## 2018-04-30 NOTE — ED Notes (Signed)
CBg [id number]

## 2018-04-30 NOTE — ED Provider Notes (Signed)
[facility] EMERGENCY DEPARTMENT Provider Note   CSN: [id number] Arrival date & time: 04/30/18  2013     History   Chief Complaint Chief Complaint  Patient presents with  . Altered Mental Status    HPI [name]. is a 57 y.o. male.  The history is provided by the patient, medical records and a relative. No language interpreter was used.  Altered Mental Status   This is a new problem. The current episode started yesterday. The problem has been gradually worsening. Associated symptoms include confusion and somnolence. Pertinent negatives include no seizures, no weakness and no agitation. Risk factors include the patient not taking medications correctly. His past medical history is significant for diabetes, CVA, hypertension and heart disease. His past medical history does not include head trauma.    Past Medical History:  Diagnosis Date  . Brain tumor ([place])    Takes Topamax so patient will not have headaches  . Bruises easily   . CAD (coronary artery disease)    Cath 09, 99% LAD  . Cardiomyopathy, ischemic    Ischemic  . CHF (congestive heart failure) ([place])   . Cyst near coccyx   . Cyst near tailbone   . Diabetes mellitus without complication (HCC)    borderline  . Edema of both legs    Takes Lasix  . Fibromyalgia   . Gout   . Headache(784.0)    with lights  . Hypertension    dr Percival Spanish  . Kidney stones   . Myocardial infarction ([place])   . Obesity   . Pneumonia    hx of  . Shortness of breath   . Stroke [facility])    Affected vision, walking, and facial drooping on right face    Patient Active Problem List   Diagnosis Date Noted  . AKI (acute kidney injury) ([place])   . Altered mental status   . Pressure injury of skin 01/29/2017  . Acute CHF ([place]) 01/28/2017  . SIRS (systemic inflammatory response syndrome) ([place]) 01/28/2017  . UTI (urinary tract infection) 01/28/2017  . Acute encephalopathy 01/28/2017  . Hereditary and idiopathic  peripheral neuropathy 01/08/2017  . Acute on chronic respiratory failure ([place]) 02/16/2016  . Irritation of right eye 10/18/2015  . 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 Headache due to intracranial disease 10/13/2013  . Cellulitis and abscess of leg 06/16/2013  . Edema 12/17/2012  . Postoperative CSF leak 11/29/2012  . Gout flare 11/12/2012  . Acute renal insufficiency 11/12/2012  . Purulent bronchitis ([place]) 10/28/2012  . Serratia infection 10/24/2012  . 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 Leucocytosis 10/24/2012  . Impaired fasting glucose 10/24/2012  . Tracheostomy status ([place]) 10/17/2012  . 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 Status post craniotomy 10/12/2012  . Respiratory failure, post-operative ([place]) 10/12/2012  . CAD (coronary artery disease) of artery bypass graft 09/09/2012  . 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 Preop cardiovascular exam 09/09/2012  . Morbid obesity ([place]) 09/09/2012  . Tobacco abuse 09/09/2012    Past Surgical History:  Procedure Laterality Date  . 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 ANAL FISTULECTOMY    . CORONARY ARTERY BYPASS GRAFT     LIMA to LAD 2009  . CRANIOTOMY Right 11/20/2012   Procedure: CRANIOTOMY REPAIR DURAL/CENTRAL SPINAL FLUID LEAK;  Surgeon: [name], MD;  Location: [facility] NEURO ORS;  Service: Neurosurgery;  Laterality: Right;  . EYE SURGERY     to coorect lid and double vision  . MEDIAN RECTUS REPAIR Right 02/16/2016   Procedure: RIGHT LATERAL RECTUS RESECTION; SUPERIOR RECTUS RESECTION RIGHT EYE; RIGHT SUPERIOR RECTUS RECESSION; LATERAL TARSAL STRIP RIGHT LOWER EYELID;  Surgeon: [name], MD;  Location: [facility];  Service: Ophthalmology;  Laterality: Right;  . PLACEMENT OF LUMBAR DRAIN N/A 11/20/2012   Procedure: Attempted PLACEMENT OF LUMBAR DRAIN;  Surgeon: [name], MD;   Location: [facility] NEURO ORS;  Service: Neurosurgery;  Laterality: N/A;  . RETROSIGMOID CRANIECTOMY FOR TUMOR RESECTION Right 10/11/2012   Procedure: RETROSIGMOID CRANIECTOMY FOR TUMOR RESECTION;  Surgeon: [name], MD;  Location: [facility] NEURO ORS;  Service: Neurosurgery;  Laterality: Right;  Craniotomy for acoustic neuroma  . TRACHEOSTOMY TUBE PLACEMENT N/A 10/11/2012   Procedure: TRACHEOSTOMY;  Surgeon: Ascencion Dike, MD;  Location: [facility]UR[facility];  Service: ENT;  Laterality: N/A;  . UMBILICAL HERNIA REPAIR          Home Medications    Prior to Admission medications   Medication Sig Start Date End Date Taking? Authorizing Provider  albuterol (PROVENTIL HFA;VENTOLIN HFA) 108 (90 BASE) MCG/ACT inhaler Inhale 2 puffs into the lungs every 6 (six) hours as needed for wheezing or shortness of breath. 11/12/12   Love, Ivan Anchors, PA-C  antiseptic oral rinse (BIOTENE) LIQD 15 mLs by Mouth Rinse route as needed for dry mouth.     [provider]  apixaban (ELIQUIS) 5 MG TABS tablet Take 1 tablet (5 mg total) by mouth 2 (two) times daily. 02/03/17   Reyne Dumas, MD  artificial tears (LACRILUBE) OINT ophthalmic ointment Place 1 application into the right eye at bedtime.     [provider]  atenolol (TENORMIN) 50 MG tablet Take 1 tablet (50 mg total) by mouth 2 (two) times daily. Note increased dose. 11/12/12   Love, Ivan Anchors, PA-C  benzocaine (ORAJEL) 10 % mucosal gel Use as directed 1 application in the mouth or throat 2 (two) times daily as needed for pain.    [provider]  carbamazepine (TEGRETOL) 200 MG tablet Take 1 tablet (200 mg total) by mouth 2 (two) times daily. Take once at night for 4 days then twice daily thereafter 09/10/17   Meredith Staggers, MD  colchicine 0.6 MG tablet Take 1 tablet (0.6 mg total) by mouth daily. Patient taking differently: Take 0.6 mg by mouth daily as needed.  11/12/12   Love, Ivan Anchors, PA-C  diltiazem (CARDIZEM CD) 180 MG 24 hr capsule Take 1  capsule (180 mg total) by mouth daily. 02/03/17   Reyne Dumas, MD  erythromycin ophthalmic ointment APPLY INTO RIGHT EYE AT BEDTIME 10/22/17   [provider]  furosemide (LASIX) 40 MG tablet Take 1 tablet (40 mg total) by mouth 2 (two) times daily. 02/03/17   Reyne Dumas, MD  glipiZIDE (GLUCOTROL) 5 MG tablet Take 0.5 tablets (2.5 mg total) by mouth daily before breakfast. 02/03/17   Reyne Dumas, MD  HYDROcodone-acetaminophen (NORCO) 10-325 MG tablet TAKE 1 TABLET BY MOUTH EVERY 6 HOURS AS NEEDED FOR PAIN MUST LAST 30 DAYS 03/18/18   Bayard Hugger, NP  isosorbide mononitrate (IMDUR) 30 MG 24 hr tablet Take 30 mg by mouth 2 (two) times daily.    [provider]  moxifloxacin (VIGAMOX) 0.5 % ophthalmic solution PLACE 1 DROP IN RIGHT EYE EVERY 4 HOURS 10/16/17   [provider]  nystatin (MYCOSTATIN/NYSTOP) powder APPLY  1  GRAM TOPICALLY TWICE DAILY AS NEEDED 04/08/18   Meredith Staggers, MD  Skin Protectants, Misc. (EUCERIN) cream Apply 1 application topically 3 (three) times daily.    [provider]  sucralfate (CARAFATE) 1 g tablet Take 1 tablet (1 g total) by mouth 4 (four) times daily -  with meals and at bedtime. 02/23/16   Kelvin Cellar, MD  tiZANidine (ZANAFLEX) 4 MG tablet 1 tablet four times daily as needed for muscle spasms 01/15/18   Meredith Staggers, MD  tobramycin-dexamethasone [facility]) ophthalmic ointment Place 1 application into the right eye 2 (two) times daily at 10 am and 4 pm. 02/23/16   Kelvin Cellar, MD  topiramate (TOPAMAX) 100 MG tablet Take 1 tablet (100 mg total) by mouth at bedtime. 01/15/18   Meredith Staggers, MD    Family History Family History  Problem Relation Age of Onset  . CAD Father   . Hypertension Mother   . Cancer Other        multiple relatives    Social History Social History   Tobacco Use  . Smoking status: Light Tobacco Smoker    Packs/day: 0.25    Years: 20.00    Pack years: 5.00    Types: Cigarettes    . Smokeless tobacco: Current User  . Tobacco comment: Rare tobacco.  "[name]".  Substance Use Topics  . Alcohol use: No    Alcohol/week: 0.0 standard drinks  . Drug use: No     Allergies   Morphine and related; Gabapentin; Ivp dye [iodinated diagnostic agents]; and Other   Review of Systems Review of Systems  Constitutional: Positive for fatigue. Negative for chills, diaphoresis and fever.  HENT: Negative for congestion and rhinorrhea.   Eyes: Negative for photophobia and visual disturbance.  Respiratory: Positive for shortness of breath. Negative for cough and chest tightness.   Cardiovascular: Positive for leg swelling. Negative for chest pain and palpitations.  Gastrointestinal: Negative for abdominal pain, constipation, diarrhea, nausea and vomiting.  Genitourinary: Negative for dysuria, flank pain and frequency.  Musculoskeletal: Negative for back pain, neck pain and neck stiffness.  Neurological: Positive for light-headedness. Negative for dizziness, seizures, speech difficulty, weakness, numbness and headaches.  Psychiatric/Behavioral: Positive for confusion. Negative for agitation.  All other systems reviewed and are negative.    Physical Exam Updated Vital Signs BP 131/86 (BP Location: Right Arm)   Pulse 69   Temp 98.8 F (37.1 C) (Oral)   Resp 20   SpO2 91%   Physical Exam  Constitutional: He is oriented to person, place, and time. He appears well-developed and well-nourished. No distress.  HENT:  Head: Normocephalic and atraumatic.  Mouth/Throat: Oropharynx is clear and moist. No oropharyngeal exudate.  Eyes: EOM are normal.  Decreased vision in right eye unchanged from baseline by report.  Neck: Normal range of motion. Neck supple.  Cardiovascular: Normal rate, regular rhythm and intact distal pulses.  Murmur heard. Pulmonary/Chest: Effort normal. Tachypnea noted. No respiratory distress. He has wheezes. He has no rhonchi. He has rales. He  exhibits no tenderness.  Abdominal: Soft. He exhibits no distension. There is no tenderness.  Musculoskeletal: He exhibits edema. He exhibits no tenderness.  Neurological: He is oriented to person, place, and time. He is not disoriented. A cranial nerve deficit (r facial droop) is present. No sensory deficit. He exhibits abnormal muscle tone (Right-sided facial droop unchanged from baseline.). GCS eye subscore is 3. GCS verbal  subscore is 5. GCS motor subscore is 6.  Patient is somnolent but arousable to loud voice and painful stimulation.  Skin: Skin is warm and dry. Capillary refill takes less than 2 seconds. He is not diaphoretic. No erythema. No pallor.  Psychiatric: He has a normal mood and affect.  Nursing note and vitals reviewed.    ED Treatments / Results  Labs (all labs ordered are listed, but only abnormal results are displayed) Labs Reviewed  COMPREHENSIVE METABOLIC PANEL - Abnormal; Notable for the following components:      Result Value   Sodium 133 (*)    Chloride 91 (*)    Glucose, Bld 483 (*)    BUN 43 (*)    Creatinine, Ser 4.19 (*)    Calcium 8.8 (*)    Albumin 2.9 (*)    AST 57 (*)    ALT 74 (*)    Alkaline Phosphatase 258 (*)    GFR calc non Af Amer 14 (*)    GFR calc Af Amer 17 (*)    All other components within normal limits  CBC - Abnormal; Notable for the following components:   WBC 12.3 (*)    MCV 107.0 (*)    MCHC 29.5 (*)    All other components within normal limits  BRAIN NATRIURETIC PEPTIDE - Abnormal; Notable for the following components:   B Natriuretic Peptide 557.2 (*)    All other components within normal limits  URINALYSIS, ROUTINE W REFLEX MICROSCOPIC - Abnormal; Notable for the following components:   APPearance HAZY (*)    Glucose, UA >=500 (*)    Hgb urine dipstick MODERATE (*)    Protein, ur 30 (*)    Leukocytes, UA MODERATE (*)    Bacteria, UA FEW (*)    All other components within normal limits  AMMONIA - Abnormal; Notable for  the following components:   Ammonia 50 (*)    All other components within normal limits  RAPID URINE DRUG SCREEN, HOSP PERFORMED - Abnormal; Notable for the following components:   Opiates POSITIVE (*)    All other components within normal limits  TSH - Abnormal; Notable for the following components:   TSH 0.281 (*)    All other components within normal limits  CBG MONITORING, ED - Abnormal; Notable for the following components:   Glucose-Capillary 470 (*)    All other components within normal limits  I-STAT TROPONIN, ED - Abnormal; Notable for the following components:   Troponin i, poc 0.13 (*)    All other components within normal limits  I-STAT CG4 LACTIC ACID, ED - Abnormal; Notable for the following components:   Lactic Acid, Venous 2.07 (*)    All other components within normal limits  URINE CULTURE  MAGNESIUM  BLOOD GAS, VENOUS  T4, FREE  CK  I-STAT CG4 LACTIC ACID, ED    EKG EKG Interpretation  Date/Time:  Tuesday April 30 2018 20:18:04 EDT Ventricular Rate:  93 PR Interval:    QRS Duration: 102 QT Interval:  361 QTC Calculation: 449 R Axis:   139 Text Interpretation:  Atrial fibrillation Right axis deviation Low voltage, precordial leads Minimal ST depression, lateral leads When compared to prior, pt now in afib.  No STEMI Confirmed by Antony Blackbird [phone]) on 04/30/2018 8:43:18 PM   Radiology Ct Head Wo Contrast  Result Date: 04/30/2018 CLINICAL DATA:  Altered LOC EXAM: CT HEAD WITHOUT CONTRAST TECHNIQUE: Contiguous axial images were obtained from the base of the skull through the vertex  without intravenous contrast. COMPARISON:  01/28/2017 FINDINGS: Brain: No acute territorial infarction, hemorrhage, or intracranial mass is visualized. Encephalomalacia at the right CP angle consistent with prior mass resection. Extra-axial CSF density within the right posterior fossa, unchanged. Stable enlargement of fourth ventricle. Lateral ventricles are stable in size.  Vascular: No hyperdense vessels.  Carotid vascular calcification Skull: Right suboccipital craniectomy and postsurgical changes of the posterior mastoid. No fracture Sinuses/Orbits: Mucous retention cyst left maxillary sinus. No acute orbital abnormality. Other: None IMPRESSION: 1. No CT evidence for acute intracranial abnormality. 2. Stable encephalomalacia of the right CP angle and operative changes of the posterior fossa. Electronically Signed   By: Donavan Foil M.D.   On: 04/30/2018 22:10   Dg Chest Portable 1 View  Result Date: 04/30/2018 CLINICAL DATA:  Altered mental status, history coronary artery disease post MI, CHF, diabetes mellitus, hypertension, smoker, stroke EXAM: PORTABLE CHEST 1 VIEW COMPARISON:  Portable exam 2103 hours compared to 01/29/2017 FINDINGS: Enlargement of cardiac silhouette post CABG. Pulmonary vascular congestion. Improved pulmonary edema since previous exam. No segmental consolidation, pleural effusion or pneumothorax. Bones unremarkable. IMPRESSION: Improved CHF. Electronically Signed   By: Lavonia Dana M.D.   On: 04/30/2018 21:28    Procedures Procedures (including critical care time)  CRITICAL CARE Performed by: Gwenyth Allegra Siennah Barrasso Total critical care time: 45 minutes Critical care time was exclusive of separately billable procedures and treating other patients. Hypercarbic respiratory failure requiring BiPAP, AKI, positive troponin. Critical care was necessary to treat or prevent imminent or life-threatening deterioration. Critical care was time spent personally by me on the following activities: development of treatment plan with patient and/or surrogate as well as nursing, discussions with consultants, evaluation of patient's response to treatment, examination of patient, obtaining history from patient or surrogate, ordering and performing treatments and interventions, ordering and review of laboratory studies, ordering and review of radiographic studies,  pulse oximetry and re-evaluation of patient's condition.   Medications Ordered in ED Medications  ipratropium-albuterol (DUONEB) 0.5-2.5 (3) MG/3ML nebulizer solution 3 mL (3 mLs Nebulization Given 04/30/18 2210)     Initial Impression / Assessment and Plan / ED Course  I have reviewed the triage vital signs and the nursing notes.  Pertinent labs & imaging results that were available during my care of the patient were reviewed by me and considered in my medical decision making (see chart for details).     [name]. is a 57 y.o. male with a past medical history significant for prior brain cancer status post craniotomy and removal, CAD status post CABG, CHF, diabetes with history of DKA, atrial fibrillation on Eliquis therapy, hypertension, fibromyalgia, prior stroke with residual right facial droop, chronic respiratory failure on 3 L home oxygen at baseline, kidney stones, and obesity who presents for somnolence, altered mental status, fatigue, and shortness of breath.  Patient is accompanied by family who reports that patient has had increase in stress recently.  Patient's mother died last week and the funeral was yesterday.  They report that patient has been having symptoms since the funeral.  Therefore patient does not take his medications well and may have had a significant amount of candy today.  Patient says that he is just feeling very tired and drained.  He denies any chest pain, palpitations, headache, vision changes, numbness, tingling, or unilateral weakness.  He denies any abdominal pain, nausea, vomiting, urinary symptoms or GI symptoms.  Family says that he has had shortness of breath and they think he is  not taking his medications for heart failure.   On exam, patient is somnolent but arousable to loud voice and painful stimulation.  Patient had a murmur appreciated and he has both wheezing and significant crackles in his lungs.  Patient has pitting edema in both of his  legs.  Patient's chest and abdomen are nontender.  Patient is oriented when he is woken up.  Patient's EKG shows continued atrial fibrillation.  No STEMI seen.  Clinically I am concerned about fluid overload causing his shortness of breath and fatigue however in the setting of a recent funeral and loss of mother, a worsened cardia myopathy such as Takotsubos 's is considered.  Patient will have work-up to look for infectious etiology of symptoms.  Patient was found to be hyperglycemic with a glucose of over 400 with EMS.  Family says he has had DKA in the past.  This is also considered.  Patient will have head CT given his history of stroke and the altered mental status.  He will have chest x-ray and further lab testing.  Anticipate admission for the somnolence and altered mental status.   9:30 PM Patient's work-up began to return.  Patient has evidence of hyperglycemia with a glucose of 483.  Patient also has a creatinine of 4.19 which appears to be an AKI from prior.  Patient does not have evidence of anemia but does have mild leukocytosis.  Lactic acid slightly elevated at 2.07.  Troponin elevated at 0.13.  Magnesium is normal.  Anticipate admission after work-up was completed for the AKI with elevated troponin shortness of breath and somnolence.  Patient will likely need echo to rule out Takotsubo's.   9:46 PM Lab called to report that patient's blood gas shows a CO2 greater than 90.  As patient is able to answer questions, patient will be placed on BiPAP.  Patient will be called for admission for his altered mental status, hypercarbic respiratory failure, and acute kidney injury.  After patient is off the BiPAP, he will likely need a head CT that was initially ordered however I do not think this should delay his admission  Given his lack of new focal neurologic deficits headache or head trauma.   Final Clinical Impressions(s) / ED Diagnoses   Final diagnoses:  Somnolence  Hypercarbia    AKI (acute kidney injury) ([place])    Clinical Impression: 1. Somnolence   2. Hypercarbia   3. AKI (acute kidney injury) ([place])     Disposition: Admit  This note was prepared with assistance of Dragon voice recognition software. Occasional wrong-word or sound-a-like substitutions may have occurred due to the inherent limitations of voice recognition software.      Linn Clavin, Gwenyth Allegra, MD 04/30/18 2300

## 2018-04-30 NOTE — ED Notes (Signed)
Respiratory at bedside setting up BiPAP.

## 2018-04-30 NOTE — H&P (Addendum)
Date: 05/01/2018               Patient Name:  [name]. MRN: [id number]  DOB: Aug 10, 1961 Age / Sex: 57 y.o., male   PCP: Frank Downing, MD         Medical Service: Internal Medicine Teaching Service         Attending Physician: Dr. Bartholomew Crews, MD    First Contact: Dr. Eileen Arellano Pager: [phone]  Second Contact: Dr. Trilby Arellano Pager: [phone]       After Hours (After 5p/  First Contact Pager: [phone]  weekends / holidays): Second Contact Pager: [phone]   Chief Complaint: altered mental status  History of Present Illness: 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 His wife was not present in the room, but she provided some history over the phone. She reports that he was in his normal state of health on Monday, however he has been grieving for his mother who passed away last week. On 11/15/2022, she noticed that he was having shortness of breath which worsened throughout the day. He then became sleepy and less responsive. She also noticed that he was "shakey." She has noticed that his urine is darker than normal and that it takes longer for him to empty his bladder recently. The wife does not know why her husband wears oxygen, but reports that at baseline he is unable to lay flat due to dyspnea, has swollen legs, and does not weigh himself at home. She does not believe that he has taken any more of his home narcotic (for chronic right eye pain) than usual. She believes he has been taking his medications as scheduled. She doesn't believe he has ever had a sleep study or PFTs.  Upon initial evaluation of the patient, he was  somnolent, but was able to answer questions appropriately and follow commands. An hour later upon reassessment, the patient was more alert. He does not remember any events leading up to his arrival to the hospital. He has pain in his backside and asks for his pain pill. He denies dysuria.   Of note, the patient was hospitalized in June 2018 for acute encephalopathy in the setting of urosepsis, acute CHF exacerbation, and hypercapnia. Urine cultures were negative. He diuresed 11L and his discharge weight was 370lb, which is also his reported weight today.  Upon arrival to the ED, pt was afebrile with RR 20, HR 69, BP 131/86, O2 91% on 3L (home oxygen requirement). Labs were significant for WBC 12.3, Na 133, glucose 483, BUN 43, Cr 4.19 (BL 1.5), ammonia 50 (chronically elevated), TSH 0.281, lactate 2.07, trop 0.13, and BNP 557.2. UA showed glucose >500, moderate Hb, 30 protein, neg nitrite, moderate leukocytes, few bacteria. UDS positive for opiates. EKG showed afib and right axis deviation. CXR showed pulmonary edema. He received a duoneb and was placed on BiPAP for a VBG with CO2 of 90.  Meds:  Current Meds  Medication Sig  . artificial tears (LACRILUBE) OINT ophthalmic ointment Place 1 application into both eyes at bedtime as needed for dry eyes.   Marland Kitchen atenolol (TENORMIN) 50 MG tablet Take  1 tablet (50 mg total) by mouth 2 (two) times daily. Note increased dose.  . carbamazepine (TEGRETOL) 200 MG tablet Take 1 tablet (200 mg total) by mouth 2 (two) times daily. Take once at night for 4 days then twice daily thereafter  . furosemide (LASIX) 80 MG tablet Take 160 mg by mouth daily.  Marland Kitchen HYDROcodone-acetaminophen (NORCO) 10-325 MG tablet TAKE 1 TABLET BY MOUTH EVERY 6 HOURS AS NEEDED FOR PAIN MUST LAST 30 DAYS  . isosorbide mononitrate (IMDUR) 30 MG 24 hr tablet Take 30 mg by mouth 2 (two) times daily.    Allergies: Allergies as of 04/30/2018 - Review Complete 04/30/2018  Allergen Reaction Noted  .  Morphine and related Anaphylaxis 09/26/2012  . Gabapentin Other (See Comments) 09/10/2017  . Ivp dye [iodinated diagnostic agents]  12/15/2015  . Other  10/11/2012   Past Medical History:  Diagnosis Date  . Brain tumor ([place])    Takes Topamax so patient will not have headaches  . Bruises easily   . CAD (coronary artery disease)    Cath 09, 99% LAD  . Cardiomyopathy, ischemic    Ischemic  . CHF (congestive heart failure) ([place])   . Cyst near coccyx   . Cyst near tailbone   . Diabetes mellitus without complication ([facility])    borderline  . Edema of both legs    Takes Lasix  . Fibromyalgia   . Gout   . Headache(784.0)    with lights  . Hypertension    dr [name]  . Kidney stones   . Myocardial infarction ([place])   . Obesity   . Pneumonia    hx of  . Shortness of breath   . Stroke [facility])    Affected vision, walking, and facial drooping on right face    Family History: Mother - HTN. Father - CAD.  Social History: Lives with his wife. His mother used to live with them as well, but she passed away last week. He quit smoking when he started oxygen therapy in 2014. He does not use alcohol or illicit drugs.  Review of Systems: A complete ROS was negative except as per HPI.   Physical Exam: Blood pressure (!) 117/50, pulse 82, temperature (!) 97.4 F (36.3 C), resp. rate (!) 26, height 5' 10"  (1.778 m), weight (!) 172.4 kg, SpO2 95 %. Constitutional: Obese. On BiPAP. Alert and oriented x3. Somnolent, but easily awakes. Quickly falls back asleep. Responds to questions appropriately and follows commands. Eyes: Pupils are equal, round, and reactive to light. Right eye laterally deviated. Cardiovascular: Normal rate and regular rhythm. No murmurs, rubs, or gallops. JVD. Pulmonary/Chest: Labored breathing on BiPAP. Unable to auscultate posterior lungs due to patient positioning and inability to sit up. Abdominal: Bowel sounds present. Soft, non-distended. TTP in the RUQ.  Ext: 2+ pitting  edema BLE with stasis dermatitis.  Skin: Warm and dry. No evidence of intertriginous infection.  EKG: personally reviewed my interpretation is afib and right axis deviation.  CXR: personally reviewed my interpretation is pulmonary edema.   Assessment & Plan by Problem: Active Problems:   Acute metabolic encephalopathy  Mr. 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 The patient is  currently afebrile, hemodynamically stable, and requiring BiPAP for hypercapnia (initial VBG showed CO2 of 90).   Acute metabolic encephalopathy  Acute on chronic non-anion gap respiratory acidosis secondary to chronic hypercapnic respiratory failure - Most likely due to hypercapnia from OHS or OSA. No evidence or report of workup for this problem in the past with PFTs or sleep study. Wears 3L O2 at home. ABG: ph 7.24, pCO2 77, bicarb 33, pO2 88. Mental status improved somewhat while on BiPAP. Repeat ABG two hours later and after 6 hours total of BiPAP showed pH 7.23, CO2 76, bicarb 32, pO2 93. Patient continues to be somnolent. Concern that the patient will tire out on BiPAP and require intubation. Case discussed with PCCM who do not think intubation is required and recommend continued use of BiPAP. - DDx for encephalopathy also includes uremia given AKI, hyperammonemia, and UTI. However, patient denies dysuria and other urinary symptoms.  Plan - Continue BiPAP until CO2 has decreased or mental status returns to baseline - Repeat ABG only if mental status deteriorates - Continue BiPAP overnight and for naps throughout hospitalization - Consider outpatient sleep study  Acute CHF  Exacerbation - Last ECHO 01/2017 showed EF 55%. Patient diuresed 11L during his last admission. No accurate weight comparison available at time time, however patient clinically appears volume overloaded with edema on CXR, JVD, and 2+ pitting edema BLE. BNP 557. Home diuresis regimen includes lasix 160 mg QD. Plan - Tele - ECHO - Lasix 22m IV BID - Daily CMP - Daily weights - Strict I/Os  AKI - Cr 4.19; baseline 1.5. Most likely prerenal due to volume overload 2/2 CHF exacerbation. DDx also includes decreased PO intake and dehydration in the setting of AMS, but patient clinically appears volume up. Plan - Diurese as above - Trend Cr - Renal [place] Troponinemia - 0.13 --> 0.10. Most likely demand ischemia 2/2 acute CHF exacerbation. EKG without new ischemic changes. Plan - Trend troponin  Abdominal pain Transaminitis Hyperammonemia Elevated alk phos - RUQ TTP on exam. Patient is unable to give a good history of this pain or ROS due to AMS. At this point the DDx is broad and includes MSK and hepatobiliary pathology. Ammonia is chronically elevated. Transaminitis and elevated alk phos is new. 01/2017 abdominal [place] diffuse fatty infiltration of the liver. Plan - Abdominal [place]- Reassess abdominal pain when mental status stabilizes  Asymptomatic Bacteriuria - UA showed glucose >500, moderate Hb, 30 protein, neg nitrite, moderate leukocytes, few bacteria. Mild leukocytosis to 12.3. Patient denies urinary symptoms. Treated for UTI in 01/2017. Urine culture at that time was inconclusive.  Plan - Follow up urine culture  Afib - Home medication includes Eliquis for afib, however it is unclear if the patient has been taking this at home. CHADSVASC 5. Plan - Heparin until home medications are confirmed  DMII Hyperglycemia - Last A1c 6.6 in 01/2017. Home medication list includes glipizide, but the patient's wife states he's not taking this. A1c today is 10.5. Low concern for DKA given  lack of ketones on UA. Acidosis is non-gap and more likely related to hypercapnia than ketosis. DDx includes HHS, but blood sugar responded to 10u Novolog x2.  Plan - Lantus 20u QD - SSI - Consider insulin drip if continues to be hyperglycemic.  HTN - Pt has been normotensive Plan - Continue home atenolol  Chronic headache disorder - s/p craniectomy for acoustic neuroma Plan - Continue home tegretol.   Low TSH - Normal T4. Likely  subclinical hypothyroidism.  FEN: no IV fluids, NPO while on bipap, replace electrolytes as needed  DVT ppx: heparin Code status: FULL code  Dispo: Admit patient to Inpatient with expected length of stay greater than 2 midnights.  Signed: Corinne Ports, MD 05/01/2018, 3:24 AM

## 2018-04-30 NOTE — ED Notes (Signed)
Respiratory called for BiPAP.     

## 2018-04-30 NOTE — ED Triage Notes (Signed)
Pt arrives from home via [facility] c/o AMS, no specific complaints. Pt denies CP, SOB, dysuria. EMS notes CBG 454, pt reports hx DM but not on any meds.  Pt oriented to self and place, disoriented to time and situation.

## 2018-04-30 NOTE — ED Notes (Signed)
ED Provider at bedside. 

## 2018-04-30 NOTE — ED Notes (Signed)
[name] (wife); [phone] Sheffield Slider (cousin) [phone] Call for any updates

## 2018-05-01 ENCOUNTER — Inpatient Hospital Stay (HOSPITAL_COMMUNITY): Payer: Medicare PPO

## 2018-05-01 DIAGNOSIS — Z91041 Radiographic dye allergy status: Secondary | ICD-10-CM

## 2018-05-01 DIAGNOSIS — I5043 Acute on chronic combined systolic (congestive) and diastolic (congestive) heart failure: Secondary | ICD-10-CM | POA: Diagnosis present

## 2018-05-01 DIAGNOSIS — N189 Chronic kidney disease, unspecified: Secondary | ICD-10-CM | POA: Diagnosis not present

## 2018-05-01 DIAGNOSIS — Z79899 Other long term (current) drug therapy: Secondary | ICD-10-CM

## 2018-05-01 DIAGNOSIS — I2510 Atherosclerotic heart disease of native coronary artery without angina pectoris: Secondary | ICD-10-CM

## 2018-05-01 DIAGNOSIS — Z6841 Body Mass Index (BMI) 40.0 and over, adult: Secondary | ICD-10-CM | POA: Diagnosis not present

## 2018-05-01 DIAGNOSIS — I4891 Unspecified atrial fibrillation: Secondary | ICD-10-CM | POA: Diagnosis not present

## 2018-05-01 DIAGNOSIS — E662 Morbid (severe) obesity with alveolar hypoventilation: Secondary | ICD-10-CM | POA: Diagnosis present

## 2018-05-01 DIAGNOSIS — E876 Hypokalemia: Secondary | ICD-10-CM | POA: Diagnosis not present

## 2018-05-01 DIAGNOSIS — Z23 Encounter for immunization: Secondary | ICD-10-CM | POA: Diagnosis present

## 2018-05-01 DIAGNOSIS — Z9981 Dependence on supplemental oxygen: Secondary | ICD-10-CM

## 2018-05-01 DIAGNOSIS — N179 Acute kidney failure, unspecified: Secondary | ICD-10-CM | POA: Diagnosis present

## 2018-05-01 DIAGNOSIS — Z885 Allergy status to narcotic agent status: Secondary | ICD-10-CM

## 2018-05-01 DIAGNOSIS — R400 Somnolence: Secondary | ICD-10-CM | POA: Diagnosis present

## 2018-05-01 DIAGNOSIS — E1142 Type 2 diabetes mellitus with diabetic polyneuropathy: Secondary | ICD-10-CM | POA: Diagnosis present

## 2018-05-01 DIAGNOSIS — R269 Unspecified abnormalities of gait and mobility: Secondary | ICD-10-CM | POA: Diagnosis present

## 2018-05-01 DIAGNOSIS — Z86718 Personal history of other venous thrombosis and embolism: Secondary | ICD-10-CM | POA: Diagnosis not present

## 2018-05-01 DIAGNOSIS — H538 Other visual disturbances: Secondary | ICD-10-CM

## 2018-05-01 DIAGNOSIS — G9341 Metabolic encephalopathy: Secondary | ICD-10-CM | POA: Diagnosis present

## 2018-05-01 DIAGNOSIS — I69392 Facial weakness following cerebral infarction: Secondary | ICD-10-CM

## 2018-05-01 DIAGNOSIS — Z79891 Long term (current) use of opiate analgesic: Secondary | ICD-10-CM

## 2018-05-01 DIAGNOSIS — I130 Hypertensive heart and chronic kidney disease with heart failure and stage 1 through stage 4 chronic kidney disease, or unspecified chronic kidney disease: Principal | ICD-10-CM

## 2018-05-01 DIAGNOSIS — E02 Subclinical iodine-deficiency hypothyroidism: Secondary | ICD-10-CM

## 2018-05-01 DIAGNOSIS — E1122 Type 2 diabetes mellitus with diabetic chronic kidney disease: Secondary | ICD-10-CM | POA: Diagnosis present

## 2018-05-01 DIAGNOSIS — I5031 Acute diastolic (congestive) heart failure: Secondary | ICD-10-CM | POA: Diagnosis not present

## 2018-05-01 DIAGNOSIS — Z794 Long term (current) use of insulin: Secondary | ICD-10-CM

## 2018-05-01 DIAGNOSIS — R0689 Other abnormalities of breathing: Secondary | ICD-10-CM

## 2018-05-01 DIAGNOSIS — N182 Chronic kidney disease, stage 2 (mild): Secondary | ICD-10-CM

## 2018-05-01 DIAGNOSIS — R739 Hyperglycemia, unspecified: Secondary | ICD-10-CM

## 2018-05-01 DIAGNOSIS — R32 Unspecified urinary incontinence: Secondary | ICD-10-CM

## 2018-05-01 DIAGNOSIS — E8729 Other acidosis: Secondary | ICD-10-CM | POA: Insufficient documentation

## 2018-05-01 DIAGNOSIS — I517 Cardiomegaly: Secondary | ICD-10-CM | POA: Diagnosis not present

## 2018-05-01 DIAGNOSIS — Z8249 Family history of ischemic heart disease and other diseases of the circulatory system: Secondary | ICD-10-CM

## 2018-05-01 DIAGNOSIS — R7989 Other specified abnormal findings of blood chemistry: Secondary | ICD-10-CM

## 2018-05-01 DIAGNOSIS — E7220 Disorder of urea cycle metabolism, unspecified: Secondary | ICD-10-CM

## 2018-05-01 DIAGNOSIS — H5461 Unqualified visual loss, right eye, normal vision left eye: Secondary | ICD-10-CM

## 2018-05-01 DIAGNOSIS — R740 Nonspecific elevation of levels of transaminase and lactic acid dehydrogenase [LDH]: Secondary | ICD-10-CM

## 2018-05-01 DIAGNOSIS — E1165 Type 2 diabetes mellitus with hyperglycemia: Secondary | ICD-10-CM

## 2018-05-01 DIAGNOSIS — R8271 Bacteriuria: Secondary | ICD-10-CM

## 2018-05-01 DIAGNOSIS — Z9889 Other specified postprocedural states: Secondary | ICD-10-CM

## 2018-05-01 DIAGNOSIS — N183 Chronic kidney disease, stage 3 (moderate): Secondary | ICD-10-CM | POA: Diagnosis present

## 2018-05-01 DIAGNOSIS — R51 Headache: Secondary | ICD-10-CM

## 2018-05-01 DIAGNOSIS — Z951 Presence of aortocoronary bypass graft: Secondary | ICD-10-CM

## 2018-05-01 DIAGNOSIS — E874 Mixed disorder of acid-base balance: Secondary | ICD-10-CM | POA: Diagnosis present

## 2018-05-01 DIAGNOSIS — Z888 Allergy status to other drugs, medicaments and biological substances status: Secondary | ICD-10-CM

## 2018-05-01 DIAGNOSIS — J9622 Acute and chronic respiratory failure with hypercapnia: Secondary | ICD-10-CM | POA: Diagnosis present

## 2018-05-01 DIAGNOSIS — I482 Chronic atrial fibrillation: Secondary | ICD-10-CM | POA: Diagnosis present

## 2018-05-01 DIAGNOSIS — J9621 Acute and chronic respiratory failure with hypoxia: Secondary | ICD-10-CM | POA: Diagnosis not present

## 2018-05-01 DIAGNOSIS — E872 Acidosis: Secondary | ICD-10-CM | POA: Diagnosis not present

## 2018-05-01 DIAGNOSIS — I69398 Other sequelae of cerebral infarction: Secondary | ICD-10-CM

## 2018-05-01 DIAGNOSIS — R1011 Right upper quadrant pain: Secondary | ICD-10-CM | POA: Diagnosis not present

## 2018-05-01 DIAGNOSIS — Z7901 Long term (current) use of anticoagulants: Secondary | ICD-10-CM

## 2018-05-01 DIAGNOSIS — Z960 Presence of urogenital implants: Secondary | ICD-10-CM

## 2018-05-01 LAB — COMPREHENSIVE METABOLIC PANEL
ALBUMIN: 2.9 g/dL — AB (ref 3.5–5.0)
ALBUMIN: 2.8 g/dL — AB (ref 3.5–5.0)
ALK PHOS: 250 U/L — AB (ref 38–126)
ALT: 67 U/L — AB (ref 0–44)
ALT: 67 U/L — ABNORMAL HIGH (ref 0–44)
AST: 43 U/L — AB (ref 15–41)
AST: 48 U/L — ABNORMAL HIGH (ref 15–41)
Alkaline Phosphatase: 241 U/L — ABNORMAL HIGH (ref 38–126)
Anion gap: 14 (ref 5–15)
Anion gap: 12 (ref 5–15)
BILIRUBIN TOTAL: 1.1 mg/dL (ref 0.3–1.2)
BUN: 45 mg/dL — AB (ref 6–20)
BUN: 46 mg/dL — ABNORMAL HIGH (ref 6–20)
CALCIUM: 8.9 mg/dL (ref 8.9–10.3)
CHLORIDE: 93 mmol/L — AB (ref 98–111)
CO2: 30 mmol/L (ref 22–32)
CO2: 31 mmol/L (ref 22–32)
CREATININE: 3.99 mg/dL — AB (ref 0.61–1.24)
Calcium: 8.8 mg/dL — ABNORMAL LOW (ref 8.9–10.3)
Chloride: 96 mmol/L — ABNORMAL LOW (ref 98–111)
Creatinine, Ser: 3.58 mg/dL — ABNORMAL HIGH (ref 0.61–1.24)
GFR calc Af Amer: 18 mL/min — ABNORMAL LOW (ref >60)
GFR calc non Af Amer: 15 mL/min — ABNORMAL LOW (ref >60)
GFR calc non Af Amer: 17 mL/min — ABNORMAL LOW (ref >60)
GFR, EST AFRICAN AMERICAN: 20 mL/min — AB (ref >60)
GLUCOSE: 343 mg/dL — AB (ref 70–99)
Glucose, Bld: 324 mg/dL — ABNORMAL HIGH (ref 70–99)
POTASSIUM: 3.8 mmol/L (ref 3.5–5.1)
POTASSIUM: 3.7 mmol/L (ref 3.5–5.1)
Sodium: 137 mmol/L (ref 135–145)
Sodium: 139 mmol/L (ref 135–145)
TOTAL PROTEIN: 7.1 g/dL (ref 6.5–8.1)
Total Bilirubin: 1 mg/dL (ref 0.3–1.2)
Total Protein: 7.2 g/dL (ref 6.5–8.1)

## 2018-05-01 LAB — BLOOD GAS, ARTERIAL
ACID-BASE EXCESS: 8.9 mmol/L — AB (ref 0.0–2.0)
Bicarbonate: 35.8 mmol/L — ABNORMAL HIGH (ref 20.0–28.0)
DELIVERY SYSTEMS: POSITIVE
DRAWN BY: 519031
EXPIRATORY PAP: 7
FIO2: 40
Inspiratory PAP: 14
LHR: 10 {breaths}/min
O2 SAT: 96.6 %
PCO2 ART: 79.8 mmHg — AB (ref 32.0–48.0)
PH ART: 7.272 — AB (ref 7.350–7.450)
Patient temperature: 98
pO2, Arterial: 86.8 mmHg (ref 83.0–108.0)

## 2018-05-01 LAB — ECHOCARDIOGRAM COMPLETE
Height: 70 in
Weight: 6155.24 oz

## 2018-05-01 LAB — I-STAT ARTERIAL BLOOD GAS, ED
Acid-Base Excess: 3 mmol/L — ABNORMAL HIGH (ref 0.0–2.0)
Acid-Base Excess: 2 mmol/L (ref 0.0–2.0)
BICARBONATE: 32.7 mmol/L — AB (ref 20.0–28.0)
Bicarbonate: 33.7 mmol/L — ABNORMAL HIGH (ref 20.0–28.0)
O2 SAT: 95 %
O2 Saturation: 95 %
PO2 ART: 93 mmHg (ref 83.0–108.0)
TCO2: 36 mmol/L — AB (ref 22–32)
TCO2: 35 mmol/L — ABNORMAL HIGH (ref 22–32)
pCO2 arterial: 77.5 mmHg (ref 32.0–48.0)
pCO2 arterial: 76.7 mmHg (ref 32.0–48.0)
pH, Arterial: 7.243 — ABNORMAL LOW (ref 7.350–7.450)
pH, Arterial: 7.234 — ABNORMAL LOW (ref 7.350–7.450)
pO2, Arterial: 88 mmHg (ref 83.0–108.0)

## 2018-05-01 LAB — CBG MONITORING, ED
GLUCOSE-CAPILLARY: 446 mg/dL — AB (ref 70–99)
GLUCOSE-CAPILLARY: 327 mg/dL — AB (ref 70–99)
GLUCOSE-CAPILLARY: 337 mg/dL — AB (ref 70–99)
GLUCOSE-CAPILLARY: 324 mg/dL — AB (ref 70–99)
GLUCOSE-CAPILLARY: 337 mg/dL — AB (ref 70–99)

## 2018-05-01 LAB — HEPARIN LEVEL (UNFRACTIONATED)
Heparin Unfractionated: <0.1 IU/mL — ABNORMAL LOW (ref 0.30–0.70)
Heparin Unfractionated: 1.54 IU/mL — ABNORMAL HIGH (ref 0.30–0.70)

## 2018-05-01 LAB — TROPONIN I
TROPONIN I: 0.18 ng/mL — AB (ref <0.03)
TROPONIN I: 0.16 ng/mL — AB (ref <0.03)
Troponin I: 0.1 ng/mL (ref <0.03)

## 2018-05-01 LAB — CBC
HEMATOCRIT: 47.2 % (ref 39.0–52.0)
Hemoglobin: 14.1 g/dL (ref 13.0–17.0)
MCH: 31.5 pg (ref 26.0–34.0)
MCHC: 29.9 g/dL — AB (ref 30.0–36.0)
MCV: 105.4 fL — ABNORMAL HIGH (ref 78.0–100.0)
Platelets: 178 10*3/uL (ref 150–400)
RBC: 4.48 MIL/uL (ref 4.22–5.81)
RDW: 12.5 % (ref 11.5–15.5)
WBC: 11.4 10*3/uL — ABNORMAL HIGH (ref 4.0–10.5)

## 2018-05-01 LAB — HEMOGLOBIN A1C
Hgb A1c MFr Bld: 10.5 % — ABNORMAL HIGH (ref 4.8–5.6)
Mean Plasma Glucose: 254.65 mg/dL

## 2018-05-01 LAB — APTT: aPTT: 34 seconds (ref 24–36)

## 2018-05-01 LAB — ETHANOL: Alcohol, Ethyl (B): <10 mg/dL (ref <10)

## 2018-05-01 LAB — MAGNESIUM: Magnesium: 2.4 mg/dL (ref 1.7–2.4)

## 2018-05-01 LAB — GLUCOSE, CAPILLARY
Glucose-Capillary: 275 mg/dL — ABNORMAL HIGH (ref 70–99)
Glucose-Capillary: 228 mg/dL — ABNORMAL HIGH (ref 70–99)

## 2018-05-01 LAB — VITAMIN B12: Vitamin B-12: 486 pg/mL (ref 180–914)

## 2018-05-01 LAB — MRSA PCR SCREENING: MRSA by PCR: NEGATIVE

## 2018-05-01 MED ORDER — SODIUM CHLORIDE 0.9 % IV SOLN
INTRAVENOUS | Status: DC
  Filled 2018-05-01: qty 1

## 2018-05-01 MED ORDER — PNEUMOCOCCAL VAC POLYVALENT 25 MCG/0.5ML IJ INJ
0.5000 mL | INJECTION | INTRAMUSCULAR | Status: AC
  Administered 2018-05-02: 0.5 mL via INTRAMUSCULAR
  Filled 2018-05-01: qty 0.5

## 2018-05-01 MED ORDER — INSULIN GLARGINE 100 UNIT/ML ~~LOC~~ SOLN
20.0000 [IU] | Freq: Every day | SUBCUTANEOUS | Status: DC
  Administered 2018-05-01: 20 [IU] via SUBCUTANEOUS
  Filled 2018-05-01: qty 0.2

## 2018-05-01 MED ORDER — CARBAMAZEPINE 200 MG PO TABS
200.0000 mg | ORAL_TABLET | Freq: Two times a day (BID) | ORAL | Status: DC
  Administered 2018-05-02 (×2): 200 mg via ORAL
  Filled 2018-05-01 (×5): qty 1

## 2018-05-01 MED ORDER — FUROSEMIDE 10 MG/ML IJ SOLN
80.0000 mg | Freq: Once | INTRAMUSCULAR | Status: AC
  Administered 2018-05-01: 80 mg via INTRAVENOUS
  Filled 2018-05-01: qty 8

## 2018-05-01 MED ORDER — HEPARIN BOLUS VIA INFUSION
5000.0000 [IU] | Freq: Once | INTRAVENOUS | Status: AC
  Administered 2018-05-01: 5000 [IU] via INTRAVENOUS
  Filled 2018-05-01: qty 5000

## 2018-05-01 MED ORDER — HEPARIN (PORCINE) IN NACL 100-0.45 UNIT/ML-% IJ SOLN
1800.0000 [IU]/h | INTRAMUSCULAR | Status: DC
  Administered 2018-05-01: 1800 [IU]/h via INTRAVENOUS
  Filled 2018-05-01 (×2): qty 250

## 2018-05-01 MED ORDER — POLYETHYLENE GLYCOL 3350 17 G PO PACK: 17.0000 g | PACK | Freq: Every day | ORAL | Status: DC | PRN

## 2018-05-01 MED ORDER — FUROSEMIDE 10 MG/ML IJ SOLN
80.0000 mg | Freq: Two times a day (BID) | INTRAMUSCULAR | Status: DC
  Administered 2018-05-01 – 2018-05-07 (×12): 80 mg via INTRAVENOUS
  Filled 2018-05-01 (×13): qty 8

## 2018-05-01 MED ORDER — SODIUM CHLORIDE 0.9 % IV SOLN
INTRAVENOUS | Status: DC
  Administered 2018-05-01: 05:00:00 via INTRAVENOUS

## 2018-05-01 MED ORDER — APIXABAN 5 MG PO TABS
5.0000 mg | ORAL_TABLET | Freq: Two times a day (BID) | ORAL | Status: DC
  Administered 2018-05-01 – 2018-05-10 (×18): 5 mg via ORAL
  Filled 2018-05-01 (×20): qty 1

## 2018-05-01 MED ORDER — INSULIN ASPART 100 UNIT/ML ~~LOC~~ SOLN
0.0000 [IU] | Freq: Every day | SUBCUTANEOUS | Status: DC
  Administered 2018-05-01 – 2018-05-09 (×7): 2 [IU] via SUBCUTANEOUS

## 2018-05-01 MED ORDER — ATENOLOL 50 MG PO TABS
50.0000 mg | ORAL_TABLET | Freq: Two times a day (BID) | ORAL | Status: DC
  Administered 2018-05-01: 50 mg via ORAL
  Filled 2018-05-01 (×3): qty 1

## 2018-05-01 MED ORDER — FUROSEMIDE 10 MG/ML IJ SOLN: 80.0000 mg | Freq: Two times a day (BID) | INTRAMUSCULAR | Status: DC

## 2018-05-01 MED ORDER — ACETAMINOPHEN 325 MG PO TABS
650.0000 mg | ORAL_TABLET | Freq: Four times a day (QID) | ORAL | Status: DC | PRN
  Administered 2018-05-05: 650 mg via ORAL
  Filled 2018-05-01: qty 2

## 2018-05-01 MED ORDER — ARTIFICIAL TEARS OPHTHALMIC OINT
1.0000 "application " | TOPICAL_OINTMENT | Freq: Every evening | OPHTHALMIC | Status: DC | PRN
  Administered 2018-05-09 – 2018-05-10 (×2): 1 via OPHTHALMIC
  Filled 2018-05-01: qty 3.5

## 2018-05-01 MED ORDER — INSULIN ASPART 100 UNIT/ML ~~LOC~~ SOLN: 0.0000 [IU] | Freq: Every day | SUBCUTANEOUS | Status: DC

## 2018-05-01 MED ORDER — DEXTROSE-NACL 5-0.45 % IV SOLN: INTRAVENOUS | Status: DC

## 2018-05-01 MED ORDER — INSULIN ASPART 100 UNIT/ML ~~LOC~~ SOLN
0.0000 [IU] | Freq: Three times a day (TID) | SUBCUTANEOUS | Status: DC
  Filled 2018-05-01: qty 1

## 2018-05-01 MED ORDER — ACETAMINOPHEN 650 MG RE SUPP: 650.0000 mg | Freq: Four times a day (QID) | RECTAL | Status: DC | PRN

## 2018-05-01 MED ORDER — INSULIN ASPART 100 UNIT/ML ~~LOC~~ SOLN
0.0000 [IU] | Freq: Three times a day (TID) | SUBCUTANEOUS | Status: DC
  Administered 2018-05-01: 15 [IU] via SUBCUTANEOUS
  Administered 2018-05-01: 12 [IU] via SUBCUTANEOUS
  Administered 2018-05-01: 11 [IU] via SUBCUTANEOUS
  Administered 2018-05-02 (×3): 7 [IU] via SUBCUTANEOUS
  Administered 2018-05-03: 11 [IU] via SUBCUTANEOUS
  Administered 2018-05-03: 15 [IU] via SUBCUTANEOUS
  Administered 2018-05-03: 11 [IU] via SUBCUTANEOUS
  Administered 2018-05-04 – 2018-05-06 (×7): 7 [IU] via SUBCUTANEOUS
  Administered 2018-05-06 – 2018-05-07 (×3): 11 [IU] via SUBCUTANEOUS
  Administered 2018-05-07: 7 [IU] via SUBCUTANEOUS
  Administered 2018-05-07: 11 [IU] via SUBCUTANEOUS
  Administered 2018-05-08: 7 [IU] via SUBCUTANEOUS
  Administered 2018-05-08: 11 [IU] via SUBCUTANEOUS
  Administered 2018-05-08: 15 [IU] via SUBCUTANEOUS
  Administered 2018-05-09 (×3): 7 [IU] via SUBCUTANEOUS
  Administered 2018-05-10: 11 [IU] via SUBCUTANEOUS
  Administered 2018-05-10: 7 [IU] via SUBCUTANEOUS
  Filled 2018-05-01 (×2): qty 1

## 2018-05-01 MED ORDER — INSULIN GLARGINE 100 UNIT/ML ~~LOC~~ SOLN: 20.0000 [IU] | Freq: Every day | SUBCUTANEOUS | Status: DC

## 2018-05-01 MED ORDER — HEPARIN (PORCINE) IN NACL 100-0.45 UNIT/ML-% IJ SOLN
1500.0000 [IU]/h | INTRAMUSCULAR | Status: DC
  Filled 2018-05-01: qty 250

## 2018-05-01 MED ORDER — INSULIN ASPART 100 UNIT/ML ~~LOC~~ SOLN
10.0000 [IU] | Freq: Once | SUBCUTANEOUS | Status: AC
  Administered 2018-05-01: 10 [IU] via SUBCUTANEOUS

## 2018-05-01 MED ORDER — INSULIN GLARGINE 100 UNIT/ML ~~LOC~~ SOLN
25.0000 [IU] | Freq: Every day | SUBCUTANEOUS | Status: DC
  Administered 2018-05-02 – 2018-05-04 (×3): 25 [IU] via SUBCUTANEOUS
  Filled 2018-05-01 (×3): qty 0.25

## 2018-05-01 NOTE — Progress Notes (Signed)
  Patient transported to [facility] via BIPAP without incidence.

## 2018-05-01 NOTE — Progress Notes (Signed)
[name] was admitted earlier tonight for acute metabolic encephalopathy that is thought to be 2/2 CO2 retention. He has a history of chronic hypercapnic respiratory failure and is oxygen-dependent, 3L. I am not quite sure why he is oxygen-dependent. When we called his wife earlier she was not able to explain either. VBG in the ED showed a CO2 of 90 and he was placed on BiPAP. I ordered IV Lasix 80 x1 as he has a history of HFpEF, appears hypervolemic on exam, and has AKI on CKD (BL Cr 1.4 --> 4.19 on admission, no hyperkalemia or metabolic acidosis). Unfortunately, he does not weight himself at home and dry weight is unknown. He weighed 380 lbs 2 months ago. I suspect there is a component of OSA/OHS given patient is morbidly obese, but he has never had a sleep study. Uremia also possible in the setting of severe AKI on CKD. He has a history of CVA with R-sided deficits but head CT negative on admission.   Went to re-evaluate patient at bedside at 12:30PM. He has now been on BiPAP 12/6 x 2 hours and remains somnolent but now easily arousable to voice. He is oriented to self and time, answers questions appropriately, and follows commands. GCS 14.  He continues to request pain medication for chronic pain. ABG 7.24/77.5/88/33.7. RT aware and changed BiPAP settings to 18/6.   Repeat ABG 2 hours later showed no improvement in acidosis or CO2, 7.234/76.7/93/32.7. Re-evaluated patient at bedside again at ~ 3:30AM. He appears more somnolent and does not arouse as easily to voice. He also answers questions inappropriately at times. GCS 11. RT adjusted BiPAP to 20/5. I have consulted PCCM as I am concerned he will require intubation for impending respiratory failure.   Frank Roche, MD  Internal Medicine PGY-2  P [phone]

## 2018-05-01 NOTE — Progress Notes (Signed)
[facility] for Heparin  Indication: atrial fibrillation  Allergies  Allergen Reactions  . Morphine And Related Anaphylaxis    "makes me stop breathing"  . Gabapentin Other (See Comments)    Blood in urine  . Ivp Dye [Iodinated Diagnostic Agents]     Passing out  . Other     Steroids makes hearts race    Patient Measurements: Height: 5\' 10"  (177.8 cm)(from 03/18/18) Weight: (!) 380 lb (172.4 kg)(reported weight in July 2019) IBW/kg (Calculated) : 73 Heparin Dosing Weight: 115 kg   Vital Signs: Temp: 97.4 F (36.3 C) (09/11 0323) BP: 124/83 (09/11 1245) Pulse Rate: 64 (09/11 1245)  Labs: Recent Labs    04/30/18 2044 04/30/18 2132 05/01/18 0230 05/01/18 0527 05/01/18 1000  HGB 14.0  --   --  14.1  --   HCT 47.5  --   --  47.2  --   PLT 179  --   --  178  --   APTT  --   --  34  --   --   HEPARINUNFRC  --   --  <0.10*  --  1.54*  CREATININE 4.19*  --   --  3.99*  --   CKTOTAL  --  32*  --   --   --   TROPONINI  --   --  0.10* 0.18*  --     Estimated Creatinine Clearance: 32.6 mL/min (A) (by C-G formula based on SCr of 3.99 mg/dL (H)).  Assessment: 57 y.o male presented with altered mental status.  Pharmacy consulted to start IV heparin for Afib.  Apixaban on PTA med list but reported that patient not taking it. Initial heparin level is elevated. Confirmed that RN drew it correctly. No bleeding noted.   Goal of Therapy:  Heparin level 0.3-0.7 units/ml Monitor platelets by anticoagulation protocol: Yes   Plan:  Hold heparin x 1 hour Restart heparin gtt at 1500 units/hr Check an 8 hr heparin level Daily heparin level and CBC F/u need for anticoagulation with apixaban stopped PTA  Salome Arnt, PharmD, BCPS Please see AMION for all pharmacy numbers 05/01/2018 12:57 PM

## 2018-05-01 NOTE — Progress Notes (Signed)
RT NOTES: Critical ABG results called to RN. BIPAP changes made d/t critical abg. Pt now on BIPAP 20/10 35%. Pt still lethargic and difficult to arouse. RN has paged MD. Will continue to monitor.

## 2018-05-01 NOTE — Consult Note (Signed)
[name].  TDV:[id number] DOB: Oct 07, 1960 DOA: 04/30/2018 PCP: Leonard Downing, MD    LOS: 0 days   Reason for Consult / Chief Complaint:  Hypercapnia  Consulting MD and date:  [facility] 05/01/18  HPI/Summary of hospital stay:   [name]. is a 57 y.o. male who  has a past medical history of Brain tumor ([place]), Bruises easily, CAD (coronary artery disease), Cardiomyopathy, ischemic, CHF (congestive heart failure) ([place]), Cyst near coccyx, Cyst near tailbone, Diabetes mellitus without complication ([place]), Edema of both legs, Fibromyalgia, Gout, Headache(784.0), Hypertension, Kidney stones, Myocardial infarction ([place]), Obesity, Pneumonia, Shortness of breath, and Stroke ([place]).  He presented to [facility] ED on 9/11 with AMS.  He was apparently in his normal state of health 1 day prior but on day of presentation, wife noted him to be dyspneic with some confusion.  He later became more somnolent; hence, he came to ED.  He apparently has been grieving for the past week due to the unfortunate loss of his mother who passed away last week.  In ED, he was found to have AoC hypercapnic respiratory failure.  He was placed on BiPAP, initially at 12/6.  Repeat ABG's were not improved; therefore, settings were increased to 22/6 and PCCM was called for concern that he might require intubation.  Upon our arrival to room, pt is fully alert and cooperative with exam.  He does have a small - moderate air leak on BiPAP as mask might be a touch too small; however, he is on the largest mask that we have available.  He has been asking for BiPAP to come off so that he can eat and drink.  He does not have any increased WOB.  He does not currently have any indications for intubation. He tells [place] that he does not use any kind of NIMV at home.  He is chronically on 3L O2 per report.  In addition, he was found to have glucose of 483 on chemistry with a small anion gap.  Given this plus his acidemia  (7.23 / 76 / 93), he will be started on an insulin infusion for presumed DKA vs HHS.  Subjective:  On BiPAP, comfortable.  Asking for food and drink.  Objective   Blood pressure (!) 117/50, pulse 82, temperature (!) 97.4 F (36.3 C), resp. rate (!) 26, height 5\' 10"  (1.778 m), weight (!) 172.4 kg, SpO2 95 %.    FiO2 (%):  [35 %-96 %] 35 %   Intake/Output Summary (Last 24 hours) at 05/01/2018 0435 Last data filed at 04/30/2018 2013 Gross per 24 hour  Intake 0 ml  Output 0 ml  Net 0 ml   Filed Weights   05/01/18 0042  Weight: (!) 172.4 kg    Examination: General: Adult male, resting in bed, in NAD. Neuro: Sleepy but easily arouses to voice and answers all questions. HEENT: [place]/AT. Sclerae anicteric, EOMI.  BiPAP in place. Cardiovascular: IRIR, no M/R/G.  Lungs: Respirations shallow.  CTAB. Abdomen: Obese, BS x 4, soft, NT/ND.  Musculoskeletal: No gross deformities, no edema.  Skin: Intact, warm, no rashes.  Consults: date of consult/date signed off & final recs:  PCCM 9/11   Procedures: 9/11 admitted and started on BiPAP   Significant Diagnostic Tests: CT head 9/10 > negative. US renal 9/10 > no hydro. US abdomen 9/10 > cholelithiasis, probable fatty infiltration of the liver.  Micro Data: Urine 9/10 >   Antimicrobials:    Resolved Hospital Problem list  Assessment & Plan:   Acute hypercapnic respiratory failure / respiratory acidosis. Probable OSA / OHS. Hx chronic hypoxemia - on 3L O2 per report. Plan: Continue BiPAP for now and use with all naps and QHS. No indication for intubation at this point. Would not follow serial ABG's unless mental status changes. Avoid sedating meds. Likely needs sleep study and nocturnal CPAP.  Hyperglycemia with ? DKA / HHS. Plan: Start insulin gtt per DKA protocol empirically. Frequent BMP's.  Hx sCHF (Echo from June 2018 with EF 55-60%), A.fib, MI, HTN. Plan: F/u on echo. Continue lasix, heparin. Hold preadmission  eliquis.  Rest per primary team.  Disposition / Summary of Today's Plan 05/01/18   SDU under IMTS. Continue BiPAP and use with all naps and QHS. Likely needs sleep study / nocturnal CPAP.  Best Practice / Goals of Care / Disposition.   DVT prophylaxis: SCD's / heparin. GI prophylaxis: N/A. Diet: NPO. Mobility: Bedrest. Code Status: Full. Family Communication: None available.  Labs   CBC: Recent Labs  Lab 04/30/18 2044  WBC 12.3*  HGB 14.0  HCT 47.5  MCV 107.0*  PLT 102   Basic Metabolic Panel: Recent Labs  Lab 04/30/18 2044  NA 133*  K 4.3  CL 91*  CO2 29  GLUCOSE 483*  BUN 43*  CREATININE 4.19*  CALCIUM 8.8*  MG 2.4   GFR: Estimated Creatinine Clearance: 31 mL/min (A) (by C-G formula based on SCr of 4.19 mg/dL (H)). Recent Labs  Lab 04/30/18 2044 04/30/18 2113  WBC 12.3*  --   LATICACIDVEN  --  2.07*   Liver Function Tests: Recent Labs  Lab 04/30/18 2044  AST 57*  ALT 74*  ALKPHOS 258*  BILITOT 0.8  PROT 7.6  ALBUMIN 2.9*   No results for input(s): LIPASE, AMYLASE in the last 168 hours. Recent Labs  Lab 04/30/18 2132  AMMONIA 50*   ABG    Component Value Date/Time   PHART 7.234 (L) 05/01/2018 0329   PCO2ART 76.7 (HH) 05/01/2018 0329   PO2ART 93.0 05/01/2018 0329   HCO3 32.7 (H) 05/01/2018 0329   TCO2 35 (H) 05/01/2018 0329   ACIDBASEDEF 1.0 02/16/2016 2031   O2SAT 95.0 05/01/2018 0329    Coagulation Profile: No results for input(s): INR, PROTIME in the last 168 hours. Cardiac Enzymes: Recent Labs  Lab 04/30/18 2132 05/01/18 0230  CKTOTAL 32*  --   TROPONINI  --  0.10*   HbA1C: Hgb A1c MFr Bld  Date/Time Value Ref Range Status  05/01/2018 02:30 AM 10.5 (H) 4.8 - 5.6 % Final    Comment:    (NOTE) Pre diabetes:          5.7%-6.4% Diabetes:              >6.4% Glycemic control for   <7.0% adults with diabetes   01/31/2017 12:02 PM 6.6 (H) 4.8 - 5.6 % Final    Comment:    (NOTE)         Pre-diabetes: 5.7 - 6.4          Diabetes: >6.4         Glycemic control for adults with diabetes: <7.0    CBG: Recent Labs  Lab 04/30/18 2049 05/01/18 0132  GLUCAP 470* 446*     Review of Systems:   All negative; except for those that are bolded, which indicate positives.  Constitutional: weight loss, weight gain, night sweats, fevers, chills, fatigue, weakness.  HEENT: headaches, sore throat, sneezing, nasal congestion, post nasal drip,  difficulty swallowing, tooth/dental problems, visual complaints, visual changes, ear aches. Neuro: difficulty with speech, weakness, numbness, ataxia. CV:  chest pain, orthopnea, PND, swelling in lower extremities, dizziness, palpitations, syncope.  Resp: cough, hemoptysis, dyspnea, wheezing. GI: heartburn, indigestion, abdominal pain, nausea, vomiting, diarrhea, constipation, change in bowel habits, loss of appetite, hematemesis, melena, hematochezia.  GU: dysuria, change in color of urine, urgency or frequency, flank pain, hematuria. MSK: joint pain or swelling, decreased range of motion. Psych: change in mood or affect, depression, anxiety, suicidal ideations, homicidal ideations. Skin: rash, itching, bruising.   Past medical history  He,  has a past medical history of Brain tumor ([place]), Bruises easily, CAD (coronary artery disease), Cardiomyopathy, ischemic, CHF (congestive heart failure) ([place]), Cyst near coccyx, Cyst near tailbone, Diabetes mellitus without complication ([place]), Edema of both legs, Fibromyalgia, Gout, Headache(784.0), Hypertension, Kidney stones, Myocardial infarction ([place]), Obesity, Pneumonia, Shortness of breath, and Stroke ([place]).   Surgical History    Past Surgical History:  Procedure Laterality Date  . 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 ANAL FISTULECTOMY    . CORONARY ARTERY BYPASS GRAFT     LIMA to LAD 2009  .  CRANIOTOMY Right 11/20/2012   Procedure: CRANIOTOMY REPAIR DURAL/CENTRAL SPINAL FLUID LEAK;  Surgeon: [name], MD;  Location: [place] NEUR[facility] ORS;  Service: Neurosurgery;  Laterality: Right;  . EYE SURGERY     to coorect lid and double vision  . MEDIAN RECTUS REPAIR Right 02/16/2016   Procedure: RIGHT LATERAL RECTUS RESECTION; SUPERIOR RECTUS RESECTION RIGHT EYE; RIGHT SUPERIOR RECTUS RECESSION; LATERAL TARSAL STRIP RIGHT LOWER EYELID;  Surgeon: [name], MD;  Location: [place];  Service: Ophthalmology;  Laterality: Right;  . PLACEMENT OF LUMBAR DRAIN N/A 11/20/2012   Procedure: Attempted PLACEMENT OF LUMBAR DRAIN;  Surgeon: [name], MD;  Location: [place] NEURO ORS;  Service: Neurosurgery;  Laterality: N/A;  . RETROSIGMOID CRANIECTOMY FOR TUMOR RESECTION Right 10/11/2012   Procedure: RETROSIGMOID CRANIECTOMY FOR TUMOR RESECTION;  Surgeon: [name], MD;  Location: [place] NEURO ORS;  Service: Neurosurgery;  Laterality: Right;  Craniotomy for acoustic neuroma  . TRACHEOSTOMY TUBE PLACEMENT N/A 10/11/2012   Procedure: TRACHEOSTOMY;  Surgeon: Ascencion Dike, MD;  Location: [facility];  Service: ENT;  Laterality: N/A;  . UMBILICAL HERNIA REPAIR       Social History   Social History   Socioeconomic History  . Marital status: Married    Spouse name: Not on file  . Number of children: Not on file  . Years of education: Not on file  . Highest education level: Not on file  Occupational History  . Not on file  Social Needs  . Financial resource strain: Not on file  . Food insecurity:    Worry: Not on file    Inability: Not on file  . Transportation needs:    Medical: Not on file    Non-medical: Not on file  Tobacco Use  . Smoking status: Light Tobacco Smoker    Packs/day: 0.25    Years: 20.00    Pack years: 5.00    Types: Cigarettes  . Smokeless tobacco: Current User  . Tobacco comment: Rare tobacco.  "[name]".  Substance and Sexual Activity  . Alcohol use: No     Alcohol/week: 0.0 standard drinks  . Drug use: No  . Sexual activity: Yes  Lifestyle  . Physical activity:    Days per week: Not on file    Minutes per session: Not on file  . Stress: Not on file  Relationships  . Social connections:    Talks on phone: Not on file    Gets together: Not on file    Attends religious service: Not on file    Active member of club or organization: Not on file    Attends meetings of clubs or organizations: Not on file    Relationship status: Not on file  . Intimate partner violence:    Fear of current or ex partner: Not on file    Emotionally abused: Not on file    Physically abused: Not on file    Forced sexual activity: Not on file  Other Topics Concern  . Not on file  Social History Narrative  . Not on file  ,  reports that he has been smoking cigarettes. He has a 5.00 pack-year smoking history. He uses smokeless tobacco. He reports that he does not drink alcohol or use drugs.   Family history   His family history includes CAD in his father; Cancer in his other; Hypertension in his mother.   Allergies Allergies  Allergen Reactions  . Morphine And Related Anaphylaxis    "makes me stop breathing"  . Gabapentin Other (See Comments)    Blood in urine  . Ivp Dye [Iodinated Diagnostic Agents]     Passing out  . Other     Steroids makes hearts race    Home meds  Prior to Admission medications   Medication Sig Start Date End Date Taking? Authorizing Provider  artificial tears (LACRILUBE) OINT ophthalmic ointment Place 1 application into both eyes at bedtime as needed for dry eyes.    Yes [provider]  atenolol (TENORMIN) 50 MG tablet Take 1 tablet (50 mg total) by mouth 2 (two) times daily. Note increased dose. 11/12/12  Yes Love, Ivan Anchors, PA-C  carbamazepine (TEGRETOL) 200 MG tablet Take 1 tablet (200 mg total) by mouth 2 (two) times daily. Take once at night for 4 days then twice daily thereafter 09/10/17  Yes Meredith Staggers, MD    furosemide (LASIX) 80 MG tablet Take 160 mg by mouth daily. 04/26/18  Yes [provider]  HYDROcodone-acetaminophen (NORCO) 10-325 MG tablet TAKE 1 TABLET BY MOUTH EVERY 6 HOURS AS NEEDED FOR PAIN MUST LAST 30 DAYS 03/18/18  Yes Bayard Hugger, NP  isosorbide mononitrate (IMDUR) 30 MG 24 hr tablet Take 30 mg by mouth 2 (two) times daily.   Yes [provider]  albuterol (PROVENTIL HFA;VENTOLIN HFA) 108 (90 BASE) MCG/ACT inhaler Inhale 2 puffs into the lungs every 6 (six) hours as needed for wheezing or shortness of breath. Patient not taking: Reported on 04/30/2018 11/12/12   Love, Ivan Anchors, PA-C  apixaban (ELIQUIS) 5 MG TABS tablet Take 1 tablet (5 mg total) by mouth 2 (two) times daily. Patient not taking: Reported on 04/30/2018 02/03/17   Reyne Dumas, MD  colchicine 0.6 MG tablet Take 1 tablet (0.6 mg total) by mouth daily. Patient not taking: Reported on 04/30/2018 11/12/12   Love, Ivan Anchors, PA-C  diltiazem (CARDIZEM CD) 180 MG 24 hr capsule Take 1 capsule (180 mg total) by mouth daily. Patient not taking: Reported on 04/30/2018 02/03/17   Reyne Dumas, MD  furosemide (LASIX) 40 MG tablet Take 1 tablet (40 mg total) by mouth 2 (two) times daily. Patient not taking: Reported on  04/30/2018 02/03/17   Reyne Dumas, MD  glipiZIDE (GLUCOTROL) 5 MG tablet Take 0.5 tablets (2.5 mg total) by mouth daily before breakfast. Patient not taking: Reported on 04/30/2018 02/03/17   Reyne Dumas, MD  nystatin (MYCOSTATIN/NYSTOP) powder APPLY  1  GRAM TOPICALLY TWICE DAILY AS NEEDED Patient not taking: Reported on 04/30/2018 04/08/18   Meredith Staggers, MD  sucralfate (CARAFATE) 1 g tablet Take 1 tablet (1 g total) by mouth 4 (four) times daily -  with meals and at bedtime. Patient not taking: Reported on 04/30/2018 02/23/16   Kelvin Cellar, MD  tiZANidine (ZANAFLEX) 4 MG tablet 1 tablet four times daily as needed for muscle spasms Patient not taking: Reported on 04/30/2018 01/15/18   Meredith Staggers, MD  tobramycin-dexamethasone [facility]) ophthalmic ointment Place 1 application into the right eye 2 (two) times daily at 10 am and 4 pm. Patient not taking: Reported on 04/30/2018 02/23/16   Kelvin Cellar, MD  topiramate (TOPAMAX) 100 MG tablet Take 1 tablet (100 mg total) by mouth at bedtime. Patient not taking: Reported on 04/30/2018 01/15/18   Meredith Staggers, MD      Montey Hora, [facility] Pager: [phone]  or [phone] 05/01/2018, 4:35 AM

## 2018-05-01 NOTE — ED Notes (Signed)
Pt has condom cath on and in place.

## 2018-05-01 NOTE — Progress Notes (Signed)
Admitting MD paged for critical ABG results.

## 2018-05-01 NOTE — ED Notes (Signed)
Portable ultrasould bedside

## 2018-05-01 NOTE — ED Notes (Signed)
Troponin result reported to admitting MD.

## 2018-05-01 NOTE — ED Notes (Signed)
Pt CBG 337

## 2018-05-01 NOTE — Progress Notes (Signed)
RT NOTES: Patient placed on bipap 14/7 40% per Dr Halford Chessman. Patient lethargic with labored breathing. Will continue to monitor patient.

## 2018-05-01 NOTE — Progress Notes (Signed)
ABG results given to MD.  Ph 7.24 CO2 77.5 PO2 88 HCO3 33.7  Settings changed to 18/6.

## 2018-05-01 NOTE — Progress Notes (Addendum)
Notified Hospitalist of pt's critical ABG and increased settings on Bipap per RT.  Dr Halford Chessman contacted as well about results patient's responding to his name, and increase settings on Bipap.  Dr. Halford Chessman to continue to monitor on Bipap and mentation

## 2018-05-01 NOTE — Progress Notes (Signed)
ABG results reported to MD.  Ph 7.23 CO2 76 Po2 88 Hco3 33

## 2018-05-01 NOTE — ED Notes (Signed)
Spoke to admitting providers, informed about CBG, instructed to give 10 more units of novalog.  Also told them of pt's repeat requests for pain medication. Providers change mind due to pt being groggy.  RN informed pt of concern.

## 2018-05-01 NOTE — Progress Notes (Signed)
PT Cancellation Note  Patient Details Name: [name]. MRN: [id number] DOB: 1961-03-22   Cancelled Treatment:    Reason Eval/Treat Not Completed: Medical issues which prohibited therapy Pt with elevated troponins that are trending up. Will hold until medically cleared to participate with therapy.   Leighton Ruff, PT, DPT  Acute Rehabilitation Services  Pager: [phone] Office: [phone]  Rudean Hitt 05/01/2018, 9:56 AM

## 2018-05-01 NOTE — ED Notes (Addendum)
Patient [name] was 337.

## 2018-05-01 NOTE — Progress Notes (Signed)
MD came to see patient, no new orders received.  RN instructed to monitor patient and call respiratory with any changes in respiratory status and also make MD aware.  MD agrees that patient can stay in regular bed as opposed to moving to bariatric bed at this time as patient appears comfortable.  RN will continue to monitor patient and make MD aware of any changes. P.J. Anaya Bovee,RN

## 2018-05-01 NOTE — ED Notes (Signed)
Pt removed from [place] and placed on 3L [place]. Pt tolerating well at this time.

## 2018-05-01 NOTE — Progress Notes (Signed)
  Echocardiogram 2D Echocardiogram has been performed.  [name] 05/01/2018, 4:21 PM

## 2018-05-01 NOTE — Progress Notes (Addendum)
ANTICOAGULATION CONSULT NOTE - Initial Consult  Pharmacy Consult for Heparin  Indication: atrial fibrillation  Allergies  Allergen Reactions  . Morphine And Related Anaphylaxis    "makes me stop breathing"  . Gabapentin Other (See Comments)    Blood in urine  . Ivp Dye [Iodinated Diagnostic Agents]     Passing out  . Other     Steroids makes hearts race    Patient Measurements: Height: 5\' 10"  (177.8 cm)(from 03/18/18) Weight: (!) 380 lb (172.4 kg)(reported weight in July 2019) IBW/kg (Calculated) : 73 Heparin Dosing Weight: 115 kg   Vital Signs: Temp: 98.8 F (37.1 C) (09/10 2022) Temp Source: Oral (09/10 2022) BP: 136/78 (09/11 0041) Pulse Rate: 93 (09/11 0041)  Labs: Recent Labs    04/30/18 2044 04/30/18 2132  HGB 14.0  --   HCT 47.5  --   PLT 179  --   CREATININE 4.19*  --   CKTOTAL  --  32*    Estimated Creatinine Clearance: 31 mL/min (A) (by C-G formula based on SCr of 4.19 mg/dL (H)).   Medical History: Past Medical History:  Diagnosis Date  . Brain tumor ([place])    Takes Topamax so patient will not have headaches  . Bruises easily   . CAD (coronary artery disease)    Cath 09, 99% LAD  . Cardiomyopathy, ischemic    Ischemic  . CHF (congestive heart failure) ([place])   . Cyst near coccyx   . Cyst near tailbone   . Diabetes mellitus without complication ([facility])    borderline  . Edema of both legs    Takes Lasix  . Fibromyalgia   . Gout   . Headache(784.0)    with lights  . Hypertension    dr Percival Spanish  . Kidney stones   . Myocardial infarction ([place])   . Obesity   . Pneumonia    hx of  . Shortness of breath   . Stroke [facility])    Affected vision, walking, and facial drooping on right face    Medications:   (Not in a hospital admission) Scheduled:  . atenolol  50 mg Oral BID  . carbamazepine  200 mg Oral BID  . furosemide  80 mg Intravenous Once  . [START ON 05/02/2018] furosemide  80 mg Intravenous BID  . insulin aspart  0-20 Units  Subcutaneous TID WC  . insulin aspart  0-5 Units Subcutaneous QHS    Assessment: 57 y.o male presented with altered mental status.  Pharmacy consulted to start IV heparin for Afib.  Apixaban on PTA med list but reported that patient not taking it. I am uncertain how long he has been off this medication since RN reports patient is not able to give info.  Pharmacy consulted to start IV heparin for Afib.  Goal of Therapy:  Heparin level 0.3-0.7 units/ml Monitor platelets by anticoagulation protocol: Yes   Plan:  Check baseline aPTT and heparin level, since I am uncertain how long he has been off the apixaban.  Start IV Heparin drip 1800 units/hr Heparin level and CBC daily.   Nicole Cella, RPh Clinical Pharmacist Please check AMION for all [place] phone numbers After 10:00 PM, call [place] [phone] 05/01/2018,12:47 AM    ADDENDUM:  Baseline aPTT is 34 seconds and heparin level is <0.10  undetectable.  Okay to give bolus heparin.  Plan: Give heparin bolus 5000 units IV x1 from infusion bag. Notified RN of order.  I will adjust time of heparin level.  Nicole Cella, RPh Clinical Pharmacist Pager: [phone] 05/01/2018 4:36 AM

## 2018-05-01 NOTE — Progress Notes (Signed)
PCCM INTERVAL PROGRESS NOTE  Patient remains on BiPAP. Now more awake. Eyes open spontaneously. Tells me that I'm talking too loud. Wants to sleep.   Continue BiPAP overnight.  Georgann Housekeeper, AGACNP-BC [facility] Pulmonology/Critical Care Pager [phone] or [phone]  05/01/2018 8:47 PM

## 2018-05-01 NOTE — Progress Notes (Addendum)
Paged by RN that patient was more somnolent. Evaluated the patient at bedside. He continues to be on BiPAP and his settings are currently 20/10. He is hemodynamically stable. On exam, the patient wakes to sternal rub and follows commands, but he is somnolent and not conversing appropriately. He has had fluctuating mental status since admission to the hospital. When PCCM last saw him, he was more awake and intubation was not required. Will continue to monitor his mental status. If he becomes un-arousable, will ask PCCM to re-evaluate need for intubation. No changes made at this time. Will hold evening PO atenolol, tegretol, and eliquis given poor mentation.

## 2018-05-01 NOTE — Progress Notes (Signed)
Subjective:   [name] was admitted overnight for worsening respiratory function requiring BiPAP and altered mental mental.  Today, He was examined at bedside and remains confused. He kept saying "5 days" with when he was been evaluated and also exhibited intermittent non-sensible speech. Initially, he was wearing a BiPAP which was transitioned to 3L [place] with O2 saturation of 93%. Per nursing staff, he soaked his bed with urine last night which is almost expected after he received Lasix.    Objective:  Vital signs in last 24 hours: Vitals:   05/01/18 1000 05/01/18 1030 05/01/18 1045 05/01/18 1100  BP: 135/81 133/66  (!) 112/56  Pulse: 96 (!) 102 (!) 114 (!) 48  Resp: (!) 25     Temp:      TempSrc:      SpO2: 92% 93% 94% 93%  Weight:      Height:       Constitutional: Alert and oriented to name and place. Obese gentleman, remains altered with intermittent non-sensible speech, unable to speak in full sentences, moderately distressed due to decreased respiratory effort  HEENT: Nasal cannula placed, right sided ptosis Cardiovascular: Normal S1 and S2, no murmurs, gallops, rubs  Respiratory: Bibasilar crackles, O2 saturation of 93% on 3L [place] Abdomen: BS+, distended secondary to body habitus, NTTP Extremity: 2+ bilateral pitting edema up just below the knees, stasis dermatitis   Assessment/Plan:  Active Problems:   Acute metabolic encephalopathy  [name] is a 57 year old obese gentleman with medical history significant for hypercapnic respiratory failure, 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  Acute metabolic encephalopathy 2/2 acute on chronic hypercapnic respiratory failure: He presents with a 1-2 day history of  worsening SOB, somnolence and confusion. On arrival to the ED, he was found to be in respiratory distress with an initial ABG which revealed ph 7.24, pCO2 77, bicarb 33, pO2 88. He was subsequently placed on BiPAP for 6 hours however repeat ABG showed no significant change with pH 7.23, CO2 76, bicarb 32, pO2 93. This seems to be consistent with chronic hypercapnia as his calculated expected change in pH for chronic respiratory acidosis is ~7.29 whereas acute respiratory acidosis will have an expected pH of ~7.1. This is most likely secondary to his combined obstructive sleep apnea vs obesity hypoventilation syndrome. Of note, he is chronically on 3L nasal [place] at home. On assessment this morning, he remains confused but is oriented to place [facility]) and Person. His was not found to be somnolent. The team made a decision to wean off BiPAP and place him on 3L [place] and he did not show signs of hypoxia with O2 saturation of 93%.  -Pending swallow evaluation -Continue supplemental O2 on 3L  -Will obtain ABG if mentation changes -HOLD BiPAP for now unless he decompensates -Will need outpatient sleep study   Acute on chronic combined systolic and diastolic heart failure exacerbation: He appeared volume overloaded on presentation with bilateral lower extremity edema, pulmonary congestion noticeable on CXR and BNP of 557 however unreliable given obesity. Per note, he takes Lasix 160mg  daily but compliance is unclear at this point. He received IV Lasix 80mg  in the ED and nurse reported of increased UOP though no objective measurement. He still was unable to speak in full sentences, physical exams noticeable  for 2+ bilateral pitting edema, bibasilar crackles on lung exam. Unable to assess for subjective findings due to decreased mentation. His discharge weight in 01/2017 for acute CHF exacerbation was 366lbs (166.3kg). His initial weight during this admission was recorded at 172.4kg. Also noted to have elevated  troponin  0.13>>0.10>>0.18>>0.16 likely due to demand ischemia from ongoing CHF exacerbation as EKG revealed no ST abnormalities. Will correlate with patient's clinical findings and follow.  -IV Lasix 80mg  BID -Telemetry  -Echo pending  -F/u AM BMP -Daily weights -Strict I/Os  Acute Kidney Injury: Most likely pre-renal from acute exacerbation of CHF. Recorded baseline Cr. Of 1.4 in 01/2017 when he was admitted for CHF exacerbation but previously had normal creatinine so this might represent and acute on chronic kidney disease. BMP this AM with Cr 3.99<<4.19  -Continue diuresis with IV Lasix 80mg  BID -Follow up BMP   Type 2 Diabetes Mellitus: Hyperglycemic to 470s on admission, UA with >500 glucose but no ketones and no evidence of metabolic acidosis on labs. Also, he responded well to Novolog 10U x2. Subsequently started on Lantus 20U daily and SSI -Monitor CBG -Continue Lantus 20u daily and SSI   Atrial Fibrillation: Will resume home Eliquis 5mg  BID   Hypertension: BP stable with SBP 100s-140s -Continue home atenolol   Asymptomatic Bacteriuria: Urinalysis revealed >500 glucose, moderate leukocytes and few bacteria however asymptomatic.  -Pending UCx  Chronic Headache Disorder: Hx of acoustic neuroma s/p craniectomy.  -Home medications include carbamazepine, Topamax   Subclinical hyperthyroidism: Noted to have low TSH with normal T4.  -Continue to monitor   Abdominal pain: On assessment this AM, team was unable to obtain thorough history pertaining to reported RUQ pain. Of note, he has a history of fatty liver diseases seen on ultrasound on 01/2017 which could well explain his lab abnormalities of transminitis, hyperammonemia and elevated alkaline phosphatase.  -Will continue to monitor and assess once mental status improves.   FEN:NPO pending swallow evaluation, replace electrolytes as needed DVT ppx: discontinue heparin and start Eliquis pending swallow evaluation.  Code  status:FULL code  Jean Rosenthal, MD 05/01/2018, 11:47 AM Pager: [phone] IMTS PGY-1

## 2018-05-01 NOTE — ED Notes (Addendum)
Pt was aggitated, Pt wanted pain medication. RN explained that the providers had just left the room and would put the orders in as they told you they would.  Once pt understood, pt fell asleep.

## 2018-05-01 NOTE — ED Notes (Signed)
Pt CBG 324

## 2018-05-01 NOTE — Progress Notes (Signed)
RN paged MD on call regarding patient's condition.  BP low, 96/54, patient lethargic.  O2 sat currently 97 % on bipap, respirations 24.  Dr. Annie Paras returned page.  RN informed her about patient's condition and that patient is responding slightly to sternal rub, but had been more alert earlier in shift.  Dr. Annie Paras states she will come see patient.  P.J. Linus Mako, RN

## 2018-05-01 NOTE — Progress Notes (Signed)
Frank Pat.  VEL:[id number] DOB: May 30, 1961 DOA: 04/30/2018 PCP: Leonard Downing, MD    LOS: 0 days   Reason for Consult / Chief Complaint:  Altered mental status  Consulting MD and date:  Santos-Sanchez 05/01/18  HPI/Summary of hospital stay:  57 yo male smoker was found by family confused.  His mother passed away 1 week prior.  In ER he was found to have hypercapnia and started on Bipap.  UDS positive for opiates.  PMHx of CAD, HTN, Gout, Fibromyalgia, DM, ischemic CM, Acoustic neuroma, A fib on eliquis  Subjective:  Very sleepy.  Can't stay awake while talking to him.  Objective   Blood pressure (!) 130/92, pulse 96, temperature 98 F (36.7 C), temperature source Oral, resp. rate 16, height 5\' 10"  (1.778 m), weight (!) 174.5 kg, SpO2 94 %.    FiO2 (%):  [35 %-96 %] 35 %   Intake/Output Summary (Last 24 hours) at 05/01/2018 1548 Last data filed at 05/01/2018 1411 Gross per 24 hour  Intake 238.35 ml  Output 850 ml  Net -611.65 ml   Filed Weights   05/01/18 0042 05/01/18 1400  Weight: (!) 172.4 kg (!) 174.5 kg    Examination:  General - somnolent Eyes - pupils reactive ENT - no sinus tenderness, no stridor Cardiac - irregular, no murmur Chest - decreased BS, no wheeze Abdomen - soft, non tender, + bowel sounds GU - condom cath on Extremities - 2+ edema Skin - venous stasis changes Neuro - wakes up with stimulation, follows simple commands then falls back to sleep, moves all extremities   Consults: date of consult/date signed off & final recs:   Procedures: 9/11 admitted and started on BiPAP   Significant Diagnostic Tests: CT head 9/10 > negative. US renal 9/10 > no hydro. US abdomen 9/10 > cholelithiasis, probable fatty infiltration of the liver.  Micro Data: Urine 9/10 >   Antimicrobials:    Resolved Hospital Problem list    Assessment & Plan:   Acute on chronic hypercapnic respiratory failure. - continue Bipap qhs and prn during  the day - oxygen to keep SpO2 > 90% - will need outpt sleep assessment - avoid narcotics/sedative medications as able  Hyperglycemia with hx of DM.  -per primary team  Acute renal failure. - baseline creatinine 1.40 from 02/03/17 - monitor urine outpt  CAD, A fib, HTN. - per primary team  Rest per primary team.  Disposition / Summary of Today's Plan 05/01/18   Still has decreased mental status.  Will have him placed back on Bipap and f/u ABG.  If he is not improving, then will likely need to move to ICU.  Best Practice / Goals of Care / Disposition.   DVT prophylaxis: heparin gtt GI prophylaxis: not indicated Diet: NPO Mobility: Bedrest Code Status: Full Family Communication: no family at bedside  Labs   CBC: Recent Labs  Lab 04/30/18 2044 05/01/18 0527  WBC 12.3* 11.4*  HGB 14.0 14.1  HCT 47.5 47.2  MCV 107.0* 105.4*  PLT 179 258   Basic Metabolic Panel: Recent Labs  Lab 04/30/18 2044 05/01/18 0527  NA 133* 137  K 4.3 3.8  CL 91* 93*  CO2 29 30  GLUCOSE 483* 343*  BUN 43* 45*  CREATININE 4.19* 3.99*  CALCIUM 8.8* 8.8*  MG 2.4 2.4   GFR: Estimated Creatinine Clearance: 32.8 mL/min (A) (by C-G formula based on SCr of 3.99 mg/dL (H)). Recent Labs  Lab 04/30/18 2044 04/30/18 2113  05/01/18 0527  WBC 12.3*  --  11.4*  LATICACIDVEN  --  2.07*  --    Liver Function Tests: Recent Labs  Lab 04/30/18 2044 05/01/18 0527  AST 57* 43*  ALT 74* 67*  ALKPHOS 258* 241*  BILITOT 0.8 1.0  PROT 7.6 7.2  ALBUMIN 2.9* 2.9*   No results for input(s): LIPASE, AMYLASE in the last 168 hours. Recent Labs  Lab 04/30/18 2132  AMMONIA 50*   ABG    Component Value Date/Time   PHART 7.234 (L) 05/01/2018 0329   PCO2ART 76.7 (HH) 05/01/2018 0329   PO2ART 93.0 05/01/2018 0329   HCO3 32.7 (H) 05/01/2018 0329   TCO2 35 (H) 05/01/2018 0329   ACIDBASEDEF 1.0 02/16/2016 2031   O2SAT 95.0 05/01/2018 0329    Coagulation Profile: No results for input(s): INR,  PROTIME in the last 168 hours. Cardiac Enzymes: Recent Labs  Lab 04/30/18 2132 05/01/18 0230 05/01/18 0527 05/01/18 1202  CKTOTAL 32*  --   --   --   TROPONINI  --  0.10* 0.18* 0.16*   HbA1C: Hgb A1c MFr Bld  Date/Time Value Ref Range Status  05/01/2018 02:30 AM 10.5 (H) 4.8 - 5.6 % Final    Comment:    (NOTE) Pre diabetes:          5.7%-6.4% Diabetes:              >6.4% Glycemic control for   <7.0% adults with diabetes   01/31/2017 12:02 PM 6.6 (H) 4.8 - 5.6 % Final    Comment:    (NOTE)         Pre-diabetes: 5.7 - 6.4         Diabetes: >6.4         Glycemic control for adults with diabetes: <7.0    CBG: Recent Labs  Lab 05/01/18 0132 05/01/18 0458 05/01/18 0543 05/01/18 0742 05/01/18 1212  GLUCAP 446* 327* 337* 324* 337*    CC time 33 minutes  Chesley Mires, MD [facility] 05/01/2018, 4:07 PM

## 2018-05-02 DIAGNOSIS — J9621 Acute and chronic respiratory failure with hypoxia: Secondary | ICD-10-CM

## 2018-05-02 DIAGNOSIS — E669 Obesity, unspecified: Secondary | ICD-10-CM

## 2018-05-02 DIAGNOSIS — I872 Venous insufficiency (chronic) (peripheral): Secondary | ICD-10-CM

## 2018-05-02 DIAGNOSIS — J9622 Acute and chronic respiratory failure with hypercapnia: Secondary | ICD-10-CM

## 2018-05-02 LAB — BLOOD GAS, ARTERIAL
Acid-Base Excess: 10 mmol/L — ABNORMAL HIGH (ref 0.0–2.0)
Bicarbonate: 35.9 mmol/L — ABNORMAL HIGH (ref 20.0–28.0)
Delivery systems: POSITIVE
Drawn by: 350431
Expiratory PAP: 10
FIO2: 0.35
Inspiratory PAP: 20
O2 SAT: 97 %
PATIENT TEMPERATURE: 98.8
PCO2 ART: 68.4 mmHg — AB (ref 32.0–48.0)
PO2 ART: 89 mmHg (ref 83.0–108.0)
RATE: 12 resp/min
pH, Arterial: 7.341 — ABNORMAL LOW (ref 7.350–7.450)

## 2018-05-02 LAB — COMPREHENSIVE METABOLIC PANEL
ALK PHOS: 212 U/L — AB (ref 38–126)
ALT: 53 U/L — AB (ref 0–44)
ANION GAP: 11 (ref 5–15)
AST: 36 U/L (ref 15–41)
Albumin: 2.5 g/dL — ABNORMAL LOW (ref 3.5–5.0)
BILIRUBIN TOTAL: 1.3 mg/dL — AB (ref 0.3–1.2)
BUN: 48 mg/dL — ABNORMAL HIGH (ref 6–20)
CALCIUM: 9 mg/dL (ref 8.9–10.3)
CO2: 34 mmol/L — ABNORMAL HIGH (ref 22–32)
CREATININE: 3.08 mg/dL — AB (ref 0.61–1.24)
Chloride: 98 mmol/L (ref 98–111)
GFR calc non Af Amer: 21 mL/min — ABNORMAL LOW (ref >60)
GFR, EST AFRICAN AMERICAN: 24 mL/min — AB (ref >60)
Glucose, Bld: 236 mg/dL — ABNORMAL HIGH (ref 70–99)
Potassium: 3.6 mmol/L (ref 3.5–5.1)
Sodium: 143 mmol/L (ref 135–145)
Total Protein: 6.8 g/dL (ref 6.5–8.1)

## 2018-05-02 LAB — HIV ANTIBODY (ROUTINE TESTING W REFLEX): HIV SCREEN 4TH GENERATION: NONREACTIVE

## 2018-05-02 LAB — GLUCOSE, CAPILLARY
GLUCOSE-CAPILLARY: 261 mg/dL — AB (ref 70–99)
GLUCOSE-CAPILLARY: 213 mg/dL — AB (ref 70–99)
Glucose-Capillary: 232 mg/dL — ABNORMAL HIGH (ref 70–99)
Glucose-Capillary: 235 mg/dL — ABNORMAL HIGH (ref 70–99)
Glucose-Capillary: 234 mg/dL — ABNORMAL HIGH (ref 70–99)
Glucose-Capillary: 237 mg/dL — ABNORMAL HIGH (ref 70–99)

## 2018-05-02 LAB — URINE CULTURE

## 2018-05-02 MED ORDER — ALBUTEROL SULFATE (2.5 MG/3ML) 0.083% IN NEBU: 2.5000 mg | INHALATION_SOLUTION | Freq: Four times a day (QID) | RESPIRATORY_TRACT | Status: DC | PRN

## 2018-05-02 NOTE — Progress Notes (Signed)
Results for LEODIS, ALCOCER (MRN [id number]) as of 05/02/2018 13:28  Ref. Range 05/01/2018 16:52 05/01/2018 22:01 05/02/2018 05:29 05/02/2018 08:21 05/02/2018 12:05  Glucose-Capillary Latest Ref Range: 70 - 99 mg/dL 275 (H) 228 (H) 232 (H) 235 (H) 261 (H)  Noted that blood sugars continue to be greater than 180 mg/dl. Recommend increasing Lantus to 30 units daily (174.5 kg X 0.2 units/kg=34.9units) and continue Novolog 0-20 units correction scale TID & HS. Can titrate dosage as needed.   Harvel Ricks RN BSN CDE Diabetes Coordinator Pager: [phone]  8am-5pm

## 2018-05-02 NOTE — Evaluation (Signed)
Occupational Therapy Evaluation Patient Details Name: [name]. MRN: [id number] DOB: 1961/01/20 Today's Date: 05/02/2018    History of Present Illness 57 yo male smoker was found by family confused.  His mother passed away 1 week prior.  In ER he was found to have hypercapnia and started on Bipap.  UDS positive for opiates. PMHx of CAD, HTN, Gout, Fibromyalgia, DM, ischemic CM, Acoustic neuroma, A fib on eliquis.   Clinical Impression   Pt admitted with the above diagnoses and presents with below problem list. Pt will benefit from continued acute OT to address the below listed deficits and maximize independence with basic ADLs prior to d/c to venue below. PTA pt reports he was able to walk around his house with a rw. He reports needing assistance to sponge bath and that he was able to dress himself. He reports that he lives with his spouse who works during the day and he has a Psychologist, clinical for 2hrs/day. Eval limited to rolling in bed and needed +2 max physical assist for this. Pt on continuous bipap throughout session. Alert but noted to doze off easily.      Follow Up Recommendations  SNF    Equipment Recommendations  Other (comment)(TBD next venue)    Recommendations for Other Services       Precautions / Restrictions Restrictions Weight Bearing Restrictions: No Other Position/Activity Restrictions: continuous bipap, watch HR and sats      Mobility Bed Mobility Overal bed mobility: Needs Assistance Bed Mobility: Rolling Rolling: Max assist;+2 for physical assistance         General bed mobility comments: rolled to each side with max +2 A. Bed pad utilized. Pt able to assist minimally by grabbing bed rail.  Transfers                 General transfer comment: NT, elevated HR with bed mobility    Balance                                           ADL either performed or assessed with clinical judgement   ADL Overall ADL's : Needs  assistance/impaired Eating/Feeding: NPO   Grooming: Bed level;Maximal assistance   Upper Body Bathing: Bed level;Maximal assistance   Lower Body Bathing: Total assistance;+2 for physical assistance;Bed level   Upper Body Dressing : Maximal assistance;Bed level   Lower Body Dressing: Total assistance;+2 for physical assistance;Bed level                 General ADL Comments: Pt completed rolling to each side in bed with max +2 physical assist.      Vision         Perception     Praxis      Pertinent Vitals/Pain Pain Assessment: Faces Faces Pain Scale: Hurts even more Pain Location: RLE>LLE mostly in area of feet and ankles, sensitive to touch Pain Descriptors / Indicators: Grimacing Pain Intervention(s): Monitored during session;Repositioned;Limited activity within patient's tolerance     Hand Dominance     Extremity/Trunk Assessment Upper Extremity Assessment Upper Extremity Assessment: Generalized weakness   Lower Extremity Assessment Lower Extremity Assessment: Defer to PT evaluation       Communication Communication Communication: Other (comment)(bipap)   Cognition Arousal/Alertness: Awake/alert(eyes closed at times but easily arousable) Behavior During Therapy: WFL for tasks assessed/performed Overall Cognitive Status: Within Functional Limits for tasks assessed  General Comments  Pt on continuous bipap. Nurse and nursing student also present throughout session.     Exercises     Shoulder Instructions      Home Living Family/patient expects to be discharged to:: Private residence Living Arrangements: Spouse/significant other Available Help at Discharge: Family;Personal care attendant;Available PRN/intermittently;Other (Comment)(wife works days, [place] aide 2 hrs/day) Type of Home: Mobile home Home Access: Level entry     Home Layout: One level               Home Equipment: Walker - 2  wheels   Additional Comments: sponge bathes at baseline, does not do shower transfs      Prior Functioning/Environment Level of Independence: Needs assistance  Gait / Transfers Assistance Needed: rw, household ambulator ADL's / Homemaking Assistance Needed: pt reports spouse assists with bathing and he is able to dress himself.    Comments: has an aide 2 hrs per day        OT Problem List: Decreased activity tolerance;Decreased strength;Impaired balance (sitting and/or standing);Decreased knowledge of use of DME or AE;Decreased knowledge of precautions;Cardiopulmonary status limiting activity;Obesity;Pain;Increased edema      OT Treatment/Interventions: Self-care/ADL training;Therapeutic exercise;Energy conservation;DME and/or AE instruction;Therapeutic activities;Patient/family education;Balance training    OT Goals(Current goals can be found in the care plan section) Acute Rehab OT Goals Patient Stated Goal: did not state OT Goal Formulation: With patient Time For Goal Achievement: 05/16/18 Potential to Achieve Goals: Good ADL Goals Pt Will Perform Grooming: with min assist;sitting Pt Will Perform Upper Body Bathing: with mod assist;sitting Pt Will Perform Lower Body Bathing: with mod assist;sit to/from stand Pt Will Perform Upper Body Dressing: with mod assist;sitting Pt Will Perform Lower Body Dressing: with mod assist;sit to/from stand Pt Will Transfer to Toilet: with mod assist;stand pivot transfer;bedside commode Pt Will Perform Toileting - Clothing Manipulation and hygiene: with mod assist;sit to/from stand Additional ADL Goal #1: Pt will complete bed mobility at mod A +1 to prepare for OOB ADLs.  OT Frequency: Min 2X/week   Barriers to D/C:            Co-evaluation PT/OT/SLP Co-Evaluation/Treatment: Yes Reason for Co-Treatment: Complexity of the patient's impairments (multi-system involvement);For patient/therapist safety;To address functional/ADL transfers   OT  goals addressed during session: ADL's and self-care      AM-PAC PT "6 Clicks" Daily Activity     Outcome Measure Help from another person eating meals?: A Lot Help from another person taking care of personal grooming?: A Lot Help from another person toileting, which includes using toliet, bedpan, or urinal?: Total Help from another person bathing (including washing, rinsing, drying)?: Total Help from another person to put on and taking off regular upper body clothing?: A Lot Help from another person to put on and taking off regular lower body clothing?: Total 6 Click Score: 9   End of Session Equipment Utilized During Treatment: Other (comment)(bipap)  Activity Tolerance: Other (comment)(elevated HR with rolling in bed) Patient left: in bed;with call bell/phone within reach;with nursing/sitter in room  OT Visit Diagnosis: Other abnormalities of gait and mobility (R26.89);Muscle weakness (generalized) (M62.81);Pain                Time: 9518-8416 OT Time Calculation (min): 21 min Charges:  OT General Charges $OT Visit: 1 Visit OT Evaluation $OT Eval Moderate Complexity: 1 Mod    Tyrone Schimke, OT Acute Rehabilitation Services Pager: [phone] Office: [phone]  05/02/2018, 11:09 AM

## 2018-05-02 NOTE — Progress Notes (Signed)
Pt. Passed Bedside swallow evaluation with Nursing.  See Flowsheet.

## 2018-05-02 NOTE — Progress Notes (Signed)
Subjective:   Mr. Doverspike was examined at bedside and much more alert and awake this morning. Currently on bipap which was Removed during examination. He States he Had a bowel movement  earlier this morning. AAO x2 to location and name/date. Difficulty remembering exact year but remembers his birthday. States no difficulty breathing. Denies headache, chest pain. Complain of being NPO and having dry mouth. Belives he was admitted for constipation of 5 days. States he sleeps on recliner for past 3 yrs. Noticed leg swelling increasing over last 3 yrs evern since his cardiac surgery. Does not have a cardiologist. States he was adherent to his home diuretic regimen as well as other home meds before coming to the hospital. States he was using his O2 at home everyday all night long. Requesting ointment for his lower extremity pain and dry skin and eye drops for dry eye.  Objective:  Vital signs in last 24 hours: Vitals:   05/02/18 0030 05/02/18 0305 05/02/18 0406 05/02/18 0845  BP: (!) 101/52 (!) 89/56 96/61 (!) 106/53  Pulse: 94 (!) 105 (!) 102 94  Resp: (!) 22 (!) 24 (!) 21 18  Temp: 99 F (37.2 C)  98.8 F (37.1 C)   TempSrc: Axillary  Axillary   SpO2: 97% 95% 97% 96%  Weight:      Height:       Const: In NAD, obese, speaking in full sentences, Alert and oriented to name, place and birthdate CV: RRR, no MGR Resp: Scattered wheezes, O2 saturation of 89-90s on 3L[place] Abd: BS+, distended 2/2 body habitus Ext: 2+ pitting edema bilaterally with stasis dermatitis    Assessment/Plan:  Active Problems:   Acute metabolic encephalopathy  Mr. 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   Acute metabolic encephalopathy 2/2 acute on chronic hypercapnic respiratory failure: This is though to be secondary to obesity hypoventilation syndrome and/or obstructive sleep apnea. Significant improvement in mental function over the past 24 hours on BiPAP. During assessment this morning, his O2 saturation was 89-90s on 3L [place]. ABG also shows improvement. PCO2 will be consistently elevated given underlying OSA or OHS  -Passed swallow evaluation: start heart healthy diet -Albuterol q6 prn -Continue supplemental O2 on 3L [place] -Obtain ABG if mentation changes -BiPAP prn -Sleep study outpatient   Acute on chronic combined systolic and diastolic heart failure exacerbation: Still appears volume overloaded. Net output of -2.3 L (UOP 2.6L). The repeat echo obtained was indeterminate due to obesity and allergy to dye. Weight is currently at 174.5kg <<172.4kg. His low BP has made achieving diuresis difficult. Upon speaking to his outpatient pharmacy, patient last picked up Lasix in February 2019.  -IV Lasix 80mg  BID -Telemetry  -F/u AM BMP -Daily weights -Strict I/Os -Cardiac MRI once stable (will require open MRI)  AoCKD: Improving Cr. 3.99 << 3.58 -Continue diuresis with IV Lasix 80mg  BID -Follow up BMP   Type 2 Diabetes Mellitus: CBG in the 200s -Monitor CBG -Continue Lantus 25u daily and SSI   Atrial Fibrillation:  -Continue Eliquis 5mg  BID  -Hold atenolol for now   Hypertension: Labile with SBP 80s-140s -Hold atenolol for now   Chronic Headache Disorder: Hx of acoustic neuroma s/p craniectomy.  -  Home medications include carbamazepine, Topamax   Abnormal lifer function test: Most likely due to Fatty Liver Disease. However Pending Hepatitis B and hepatitis C screening.   RAJ:HHIDU Healthy, replace electrolytes as needed DVT PBD:HDIXBOE  Code status:FULL code  Dispo: Anticipated discharge in  approximately 2-3 day(s).   Dr. Debroah Loop

## 2018-05-02 NOTE — Progress Notes (Addendum)
  Date: 05/02/2018  Patient name: Koltin Wehmeyer.  Medical record number: [id number]  Date of birth: [birth year]   I have seen and evaluated this patient and I have discussed the plan of care with the house staff. Please see their note for complete details. I concur with their findings with the following additions/corrections: Mr. Lesinski was seen this morning on morning rounds with the team.  He was on BiPAP when we entered the room but was alert and uncomfortable with the BiPAP.  We removed it in place and on 3 L oxygen via nasal cannula.  He started in the low 90s on this.  His mental status improved a bit today.  He is alert and oriented to self, place, but had difficulty getting a year as he stated 2019-99.  He complains of being thirsty.  He denies any abdominal pain including right upper quadrant pain.  On exam, he had wheezing throughout his lung fields although I did not appreciate much crackles today.  His creatinine continues to decrease.  All of his LFTs have decreased except his bilirubin is up a little bit.  He is net -2.4 L but we do not have a weight today.  His respiratory acidosis has improved a little bit on ABG which was obtained overnight when it was felt he was a little bit less responsive and placed back on BiPAP.  His urine culture showed multiple species present.  Acute metabolic encephalopathy - this is likely multifactorial.  He is showing steady improvement.  We are treating the underlying causes.  Chronic respiratory acidosis -this is also improving.  He is getting BiPAP as needed.  He is oxygenating well.  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  His most recent echo in 2018 showed a normal EF.  Once he is more stable, a cardiac MRI would be a possibility if he could fit into the MRI machine.  His blood pressure occasionally is low.  He is only on atenolol which we will stop to  allow diuresis and since beta-blockers have a negative inotrope effect. He is diuresing well although this occurred when he was n.p.o. mostly.  Now that he is taking liquids, we will have to watch his intake closely as I have a feeling he will be requesting a lot of fluids.  His creatinine is tolerating the diuresis but we will have to watch his CO2 as it is slowly increasing.  If we are able to show that he has systolic dysfunction, I would be an advocate for adding digoxin for both systolic dysfunction and A. fib.  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  Continue to follow  Atrial fibrillation -he is on Eliquis and Atenolol.  His rate is in the low 100s.  Atenolol is not the best beta-blocker as he also has CAD.  We will hold his beta-blocker currently as he became hypotensive overnight and we want adequate blood pressure to diuresis.  We will use rate control PRN if he becomes tachypneic.  If he can show that he has systolic dysfunction, I would advocate for adding digoxin for both A. fib and systolic dysfunction.  Elevated LFTs -all except his bilirubin has been trending down.  His hepatitis studies are pending.  He likely has a component of [place].  Bartholomew Crews, MD 05/02/2018, 5:07 PM

## 2018-05-02 NOTE — Progress Notes (Signed)
[name].  ONG:[id number] DOB: 02/18/1961 DOA: 04/30/2018 PCP: Leonard Downing, MD    LOS: 1 day   Reason for Consult / Chief Complaint:  Altered mental status  Consulting MD and date:  Santos-Sanchez 05/01/18  HPI/Summary of hospital stay:  57 yo male smoker was found by family confused.  His mother passed away 1 week prior.  In ER he was found to have hypercapnia and started on Bipap.  UDS positive for opiates.  PMHx of CAD, HTN, Gout, Fibromyalgia, DM, ischemic CM, Acoustic neuroma, A fib on eliquis  Subjective:  Had trouble with Bipap.  Didn't like mask.  Feels he sleeps better sitting up.  Objective   Blood pressure 122/78, pulse (!) 103, temperature 98 F (36.7 C), temperature source Tympanic, resp. rate (!) 26, height 5\' 10"  (1.778 m), weight (!) 174.5 kg, SpO2 95 %.    FiO2 (%):  [35 %] 35 %   Intake/Output Summary (Last 24 hours) at 05/02/2018 1502 Last data filed at 05/02/2018 1200 Gross per 24 hour  Intake -  Output 2450 ml  Net -2450 ml   Filed Weights   05/01/18 0042 05/01/18 1400  Weight: (!) 172.4 kg (!) 174.5 kg    Examination:  General - more alert Eyes - pupils reactive ENT - no sinus tenderness, no stridor Cardiac - irregular rate/rhythm, no murmur Chest - equal breath sounds b/l, no wheezing or rales Abdomen - soft, non tender, + bowel sounds Extremities - 1+ edema Skin - peeling of skin on feet Neuro - right facial droop  Consults: date of consult/date signed off & final recs:   Procedures: 9/11 admitted and started on BiPAP   Significant Diagnostic Tests: CT head 9/10 > negative. US renal 9/10 > no hydro. US abdomen 9/10 > cholelithiasis, probable fatty infiltration of the liver.  Micro Data: Urine 9/10 > multiple species  Antimicrobials:    Assessment & Plan:   Acute on chronic hypercapnic respiratory failure. - likely has undiagnosed OSA/OHS - continue Bipap qhs and prn during the day - oxygen to keep SpO2 >  90% - weight loss - will need to get outpt sleep study - could try arranging for home vent (Trilogy) as transition from hospital to home while arranging for further sleep testing - avoid narcotics/sedatives  Hyperglycemia with hx of DM.  -per primary team  Acute renal failure. - baseline creatinine 1.40 from 02/03/17 - monitor urine outpt  CAD, A fib, HTN. - per primary team  Disposition / Summary of Today's Plan 05/02/18   He needs to stick with PAP therapy.  PCCM will sign off.  Please call if additional help needed while he is in hospital.  Best Practice / Goals of Care / Disposition.   DVT prophylaxis: heparin gtt GI prophylaxis: not indicated Diet: NPO Mobility: Bedrest Code Status: Full Family Communication: no family at bedside  Labs   CBC: Recent Labs  Lab 04/30/18 2044 05/01/18 0527  WBC 12.3* 11.4*  HGB 14.0 14.1  HCT 47.5 47.2  MCV 107.0* 105.4*  PLT 179 440   Basic Metabolic Panel: Recent Labs  Lab 04/30/18 2044 05/01/18 0527 05/01/18 1448 05/02/18 0651  NA 133* 137 139 143  K 4.3 3.8 3.7 3.6  CL 91* 93* 96* 98  CO2 29 30 31  34*  GLUCOSE 483* 343* 324* 236*  BUN 43* 45* 46* 48*  CREATININE 4.19* 3.99* 3.58* 3.08*  CALCIUM 8.8* 8.8* 8.9 9.0  MG 2.4 2.4  --   --  GFR: Estimated Creatinine Clearance: 42.5 mL/min (A) (by C-G formula based on SCr of 3.08 mg/dL (H)). Recent Labs  Lab 04/30/18 2044 04/30/18 2113 05/01/18 0527  WBC 12.3*  --  11.4*  LATICACIDVEN  --  2.07*  --    Liver Function Tests: Recent Labs  Lab 04/30/18 2044 05/01/18 0527 05/01/18 1448 05/02/18 0651  AST 57* 43* 48* 36  ALT 74* 67* 67* 53*  ALKPHOS 258* 241* 250* 212*  BILITOT 0.8 1.0 1.1 1.3*  PROT 7.6 7.2 7.1 6.8  ALBUMIN 2.9* 2.9* 2.8* 2.5*   No results for input(s): LIPASE, AMYLASE in the last 168 hours. Recent Labs  Lab 04/30/18 2132  AMMONIA 50*   ABG    Component Value Date/Time   PHART 7.341 (L) 05/02/2018 0510   PCO2ART 68.4 (HH) 05/02/2018  0510   PO2ART 89.0 05/02/2018 0510   HCO3 35.9 (H) 05/02/2018 0510   TCO2 35 (H) 05/01/2018 0329   ACIDBASEDEF 1.0 02/16/2016 2031   O2SAT 97.0 05/02/2018 0510    Coagulation Profile: No results for input(s): INR, PROTIME in the last 168 hours. Cardiac Enzymes: Recent Labs  Lab 04/30/18 2132 05/01/18 0230 05/01/18 0527 05/01/18 1202  CKTOTAL 32*  --   --   --   TROPONINI  --  0.10* 0.18* 0.16*   HbA1C: Hgb A1c MFr Bld  Date/Time Value Ref Range Status  05/01/2018 02:30 AM 10.5 (H) 4.8 - 5.6 % Final    Comment:    (NOTE) Pre diabetes:          5.7%-6.4% Diabetes:              >6.4% Glycemic control for   <7.0% adults with diabetes   01/31/2017 12:02 PM 6.6 (H) 4.8 - 5.6 % Final    Comment:    (NOTE)         Pre-diabetes: 5.7 - 6.4         Diabetes: >6.4         Glycemic control for adults with diabetes: <7.0    CBG: Recent Labs  Lab 05/01/18 2201 05/02/18 0529 05/02/18 0821 05/02/18 1205 05/02/18 McPherson, MD [facility] 05/02/2018, 3:02 PM

## 2018-05-02 NOTE — Evaluation (Addendum)
Physical Therapy Evaluation Patient Details Name: [name]. MRN: [id number] DOB: 1961/03/05 Today's Date: 05/02/2018   History of Present Illness  57 yo male smoker was found by family confused.  His mother passed away 1 week prior.  In ER he was found to have hypercapnia and started on Bipap.  UDS positive for opiates. PMHx of CAD, HTN, Gout, Fibromyalgia, DM, ischemic CM, Acoustic neuroma, A fib on eliquis.  Clinical Impression  Pt admitted with above diagnosis. Pt currently with functional limitations due to the deficits listed below (see PT Problem List). Prior to admission, patient performing household ambulation with walker and required assistance for ADL's. Pt alert and conversant but tends to doze off easily. Currently needs two person maximal assistance to rolling to left and right. Evaluation limited to bed mobility secondary to elevated heart rate and on continuous BIPAP throughout session. BIPAP 35%, HR 100-120 with afib, 20-28 RR, BP 106/53. Pt will benefit from skilled PT to increase their independence and safety with mobility to allow discharge to the venue listed below.       Follow Up Recommendations SNF    Equipment Recommendations  Other (comment)(defer to next venue)    Recommendations for Other Services       Precautions / Restrictions Precautions Precautions: Fall Precaution Comments: continuous bipap, watch HR and saats Restrictions Weight Bearing Restrictions: No Other Position/Activity Restrictions: continuous bipap, watch HR and sats      Mobility  Bed Mobility Overal bed mobility: Needs Assistance Bed Mobility: Rolling Rolling: Max assist;+2 for physical assistance         General bed mobility comments: rolled to each side with max +2 A. Bed pad utilized. Pt able to assist minimally by grabbing bed rail.  Transfers                 General transfer comment: NT, elevated HR with bed mobility  Ambulation/Gait                 Stairs            Wheelchair Mobility    Modified Rankin (Stroke Patients Only)       Balance                                             Pertinent Vitals/Pain Pain Assessment: Faces Faces Pain Scale: Hurts even more Pain Location: RLE>LLE mostly in area of feet and ankles, sensitive to touch Pain Descriptors / Indicators: Grimacing Pain Intervention(s): Monitored during session    Home Living Family/patient expects to be discharged to:: Private residence Living Arrangements: Spouse/significant other Available Help at Discharge: Family;Personal care attendant;Available PRN/intermittently;Other (Comment)(wife works days, [employer] [employer] aide 2 hrs/day) Type of Home: Mobile home Home Access: Level entry     Home Layout: One level Home Equipment: Walker - 2 wheels Additional Comments: sponge bathes at baseline, does not do shower transfs    Prior Function Level of Independence: Needs assistance   Gait / Transfers Assistance Needed: rw, household ambulator  ADL's / Homemaking Assistance Needed: pt reports spouse assists with bathing and he is able to dress himself.   Comments: has an aide 2 hrs per day     Hand Dominance        Extremity/Trunk Assessment   Upper Extremity Assessment Upper Extremity Assessment: Generalized weakness    Lower Extremity Assessment Lower Extremity Assessment:  Generalized weakness;RLE deficits/detail;LLE deficits/detail RLE Deficits / Details: at least anti gravity, able to perform heel slides and SLR. redness noted distally with scaling and hypertrophic nails, warm to touch LLE Deficits / Details: tends to rest in external rotation. at least anti gravity and able to perform heel slide and SLR. redness noted distally with scaling, hypertrophic nails, and cool to touch. faint dorsal pedal pulse.       Communication   Communication: Other (comment)(bipap)  Cognition Arousal/Alertness: Awake/alert(eyes closed at times but  easily arousable) Behavior During Therapy: WFL for tasks assessed/performed Overall Cognitive Status: Within Functional Limits for tasks assessed                                        General Comments General comments (skin integrity, edema, etc.): Pt on continuous bipap. Nurse and nursing student also present throughout session.     Exercises General Exercises - Lower Extremity Heel Slides: 5 reps;Both;Supine Straight Leg Raises: 5 reps;Both;Supine   Assessment/Plan    PT Assessment Patient needs continued PT services  PT Problem List Decreased strength;Decreased range of motion;Decreased activity tolerance;Decreased balance;Decreased mobility;Cardiopulmonary status limiting activity;Obesity;Pain       PT Treatment Interventions DME instruction;Gait training;Functional mobility training;Therapeutic activities;Therapeutic exercise;Balance training;Patient/family education    PT Goals (Current goals can be found in the Care Plan section)  Acute Rehab PT Goals Patient Stated Goal: did not state PT Goal Formulation: Patient unable to participate in goal setting(difficult to understand with Bipap) Time For Goal Achievement: 05/16/18 Potential to Achieve Goals: Fair    Frequency Min 2X/week   Barriers to discharge        Co-evaluation PT/OT/SLP Co-Evaluation/Treatment: Yes Reason for Co-Treatment: Complexity of the patient's impairments (multi-system involvement);For patient/therapist safety;To address functional/ADL transfers PT goals addressed during session: Mobility/safety with mobility OT goals addressed during session: ADL's and self-care       AM-PAC PT "6 Clicks" Daily Activity  Outcome Measure Difficulty turning over in bed (including adjusting bedclothes, sheets and blankets)?: Unable Difficulty moving from lying on back to sitting on the side of the bed? : Unable Difficulty sitting down on and standing up from a chair with arms (e.g., wheelchair,  bedside commode, etc,.)?: Unable Help needed moving to and from a bed to chair (including a wheelchair)?: Total Help needed walking in hospital room?: Total Help needed climbing 3-5 steps with a railing? : Total 6 Click Score: 6    End of Session Equipment Utilized During Treatment: Other (comment)(bipap) Activity Tolerance: Treatment limited secondary to medical complications (Comment) Patient left: in bed;with call bell/phone within reach;with nursing/sitter in room Nurse Communication: Mobility status PT Visit Diagnosis: Other abnormalities of gait and mobility (R26.89);Muscle weakness (generalized) (M62.81)    Time: 7681-1572 PT Time Calculation (min) (ACUTE ONLY): 17 min   Charges:   PT Evaluation $PT Eval Moderate Complexity: 1 Mod        Ellamae Sia, [place], DPT Acute Rehabilitation Services Pager [phone] Office [phone]   Willy Eddy 05/02/2018, 1:03 PM

## 2018-05-03 DIAGNOSIS — R1011 Right upper quadrant pain: Secondary | ICD-10-CM

## 2018-05-03 DIAGNOSIS — I4891 Unspecified atrial fibrillation: Secondary | ICD-10-CM | POA: Diagnosis present

## 2018-05-03 DIAGNOSIS — E876 Hypokalemia: Secondary | ICD-10-CM

## 2018-05-03 DIAGNOSIS — N179 Acute kidney failure, unspecified: Secondary | ICD-10-CM

## 2018-05-03 LAB — COMPREHENSIVE METABOLIC PANEL
ALT: 45 U/L — ABNORMAL HIGH (ref 0–44)
AST: 36 U/L (ref 15–41)
Albumin: 2.6 g/dL — ABNORMAL LOW (ref 3.5–5.0)
Alkaline Phosphatase: 237 U/L — ABNORMAL HIGH (ref 38–126)
Anion gap: 12 (ref 5–15)
BUN: 50 mg/dL — ABNORMAL HIGH (ref 6–20)
CHLORIDE: 95 mmol/L — AB (ref 98–111)
CO2: 36 mmol/L — AB (ref 22–32)
Calcium: 8.6 mg/dL — ABNORMAL LOW (ref 8.9–10.3)
Creatinine, Ser: 2.87 mg/dL — ABNORMAL HIGH (ref 0.61–1.24)
GFR calc non Af Amer: 23 mL/min — ABNORMAL LOW (ref >60)
GFR, EST AFRICAN AMERICAN: 26 mL/min — AB (ref >60)
Glucose, Bld: 232 mg/dL — ABNORMAL HIGH (ref 70–99)
Potassium: 2.9 mmol/L — ABNORMAL LOW (ref 3.5–5.1)
SODIUM: 143 mmol/L (ref 135–145)
Total Bilirubin: 1.1 mg/dL (ref 0.3–1.2)
Total Protein: 6.7 g/dL (ref 6.5–8.1)

## 2018-05-03 LAB — BLOOD GAS, ARTERIAL
ACID-BASE EXCESS: 14.8 mmol/L — AB (ref 0.0–2.0)
BICARBONATE: 40.9 mmol/L — AB (ref 20.0–28.0)
DRAWN BY: 350431
O2 Content: 4 L/min
O2 Saturation: 87.6 %
PATIENT TEMPERATURE: 98.6
PH ART: 7.362 (ref 7.350–7.450)
pCO2 arterial: 74 mmHg (ref 32.0–48.0)
pO2, Arterial: 55.8 mmHg — ABNORMAL LOW (ref 83.0–108.0)

## 2018-05-03 LAB — GLUCOSE, CAPILLARY
GLUCOSE-CAPILLARY: 251 mg/dL — AB (ref 70–99)
Glucose-Capillary: 255 mg/dL — ABNORMAL HIGH (ref 70–99)
Glucose-Capillary: 325 mg/dL — ABNORMAL HIGH (ref 70–99)
Glucose-Capillary: 157 mg/dL — ABNORMAL HIGH (ref 70–99)

## 2018-05-03 MED ORDER — POTASSIUM CHLORIDE 10 MEQ/100ML IV SOLN
10.0000 meq | INTRAVENOUS | Status: AC
  Administered 2018-05-03 (×6): 10 meq via INTRAVENOUS
  Filled 2018-05-03 (×4): qty 100

## 2018-05-03 MED ORDER — ONDANSETRON HCL 4 MG/2ML IJ SOLN
4.0000 mg | Freq: Once | INTRAMUSCULAR | Status: AC
  Administered 2018-05-03: 4 mg via INTRAVENOUS
  Filled 2018-05-03: qty 2

## 2018-05-03 MED ORDER — POTASSIUM CHLORIDE 10 MEQ/100ML IV SOLN
INTRAVENOUS | Status: AC
  Administered 2018-05-03: 10 meq via INTRAVENOUS
  Filled 2018-05-03: qty 100

## 2018-05-03 MED ORDER — POTASSIUM CHLORIDE CRYS ER 20 MEQ PO TBCR: 40.0000 meq | EXTENDED_RELEASE_TABLET | Freq: Two times a day (BID) | ORAL | Status: DC

## 2018-05-03 MED ORDER — CARVEDILOL 6.25 MG PO TABS
6.2500 mg | ORAL_TABLET | Freq: Two times a day (BID) | ORAL | Status: DC
  Administered 2018-05-03 – 2018-05-05 (×4): 6.25 mg via ORAL
  Filled 2018-05-03 (×4): qty 1

## 2018-05-03 MED ORDER — METOPROLOL SUCCINATE ER 25 MG PO TB24: 25.0000 mg | ORAL_TABLET | Freq: Every day | ORAL | Status: DC

## 2018-05-03 NOTE — Progress Notes (Signed)
CSW received consult for SNF placement, CSW attempted to reach pt's wife at all listed numbers. No voicemail available.   Weekend CSW will continue trying to reach them.

## 2018-05-03 NOTE — Progress Notes (Signed)
RT notified Alain Honey, RN of panic CO2 74 for patient.

## 2018-05-03 NOTE — Progress Notes (Signed)
  Date: 05/03/2018  Patient name: [name].  Medical record number: [id number]  Date of birth: 06-Sep-1960   I have seen and evaluated this patient and I have discussed the plan of care with the house staff. Please see their note for complete details. I concur with their findings with the following additions/corrections: [name] was seen this morning with the team on rounds.  He does not have those reported nor does he have a weight recorded.  His blood pressure has improved and he remains tachycardic heart rate between 100-300. His lungs show wheezing but could not appreciate crackles anymore.  His creatinine is trending down but his bicarb is trending up and is 36.  Acute metabolic encephalopathy, multifactorial.  We do not know his baseline but this seems to have resolved with treatment of his underlying issues.  Chronic respiratory acidosis -BiPAP as needed.  This likely is secondary to undiagnosed OSA and OHS.  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  Volume status assessment clinically is difficult due to body habitus.  We are not getting I/O recorded nor weights.  We also do not know his cardiac function at present.  We had intended to order a cardiac MRI but due to issues regarding the inability to assess his volume status, we will consult heart failure team.  In the meantime, we will continue Lasix 80 IV twice daily as his creatinine is liking it.  A. fib with RVR -he intended to start IV amiodarone for rate control but he is apparently allergic to a component of amiodarone.  I do not want to use a beta-blocker nor a calcium channel blocker as we do not know if he has systolic dysfunction and they can be negative inotropes.  Consult heart failure team.  Acute on chronic stage II chronic kidney disease -his creatinine is improving with diuresis.  Mobility issues -PT has recommended which  our team agrees with and will proceed to arrange so it is ready when he is medically stable  Frank Crews, MD 05/03/2018, 4:57 PM

## 2018-05-03 NOTE — Progress Notes (Signed)
Patient had some emesis earlier in evening.  Did not put patient on Bipap.  Patient's sats are in 90s.  RN will notify RT if needed.

## 2018-05-03 NOTE — Care Management Important Message (Signed)
Important Message  Patient Details  Name: [name]. MRN: [id number] Date of Birth: July 01, 1961   Medicare Important Message Given:  No  Patient too ill to sign/Unsigned copy left  Latrese Carolan 05/03/2018, 3:26 PM

## 2018-05-03 NOTE — Discharge Summary (Addendum)
Name: [name]. MRN: [id number] DOB: 1960/08/23 57 y.o. PCP: Leonard Downing, MD  Date of Admission: 04/30/2018  8:13 PM Date of Discharge:  05/10/2018 Attending Physician: Bartholomew Crews, MD  Discharge Diagnosis: 1. Acute metabolic encephalopathy 2/2 acute on chronic hypercapnic respiratory failure 2. 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 Atrial Fibrillation s/p DCCV 05/08/18 4. Chronic Kidney Disease  5. Type 2 Diabetes Mellitus 6. Hypertension  Discharge Medications: Allergies as of 05/10/2018      Reactions   Morphine And Related Anaphylaxis   "makes me stop breathing"   Gabapentin Other (See Comments)   Blood in urine   Ivp Dye [iodinated Diagnostic Agents]    Passing out   Other    Steroids makes hearts race      Medication List    STOP taking these medications   atenolol 50 MG tablet Commonly known as:  TENORMIN   diltiazem 180 MG 24 hr capsule Commonly known as:  CARDIZEM CD   furosemide 40 MG tablet Commonly known as:  LASIX   furosemide 80 MG tablet Commonly known as:  LASIX   glipiZIDE 5 MG tablet Commonly known as:  GLUCOTROL   isosorbide mononitrate 30 MG 24 hr tablet Commonly known as:  IMDUR     TAKE these medications   albuterol 108 (90 Base) MCG/ACT inhaler Commonly known as:  PROVENTIL HFA;VENTOLIN HFA Inhale 2 puffs into the lungs every 6 (six) hours as needed for wheezing or shortness of breath.   amiodarone 400 MG tablet Commonly known as:  PACERONE Take 1 tablet (400 mg total) by mouth 2 (two) times daily.   apixaban 5 MG Tabs tablet Commonly known as:  ELIQUIS Take 1 tablet (5 mg total) by mouth 2 (two) times daily.   artificial tears Oint ophthalmic ointment Place 1 application into both eyes at bedtime as needed for dry eyes.   aspirin 81 MG chewable tablet Chew 1 tablet (81 mg total) by mouth daily.   atorvastatin 40 MG tablet Commonly known as:   LIPITOR Take 1 tablet (40 mg total) by mouth daily at 6 PM.   carbamazepine 200 MG tablet Commonly known as:  TEGRETOL Take 1 tablet (200 mg total) by mouth 2 (two) times daily. Take once at night for 4 days then twice daily thereafter   carvedilol 12.5 MG tablet Commonly known as:  COREG Take 1 tablet (12.5 mg total) by mouth 2 (two) times daily with a meal.   colchicine 0.6 MG tablet Take 1 tablet (0.6 mg total) by mouth daily.   HYDROcodone-acetaminophen 10-325 MG tablet Commonly known as:  NORCO TAKE 1 TABLET BY MOUTH EVERY 6 HOURS AS NEEDED FOR PAIN MUST LAST 30 DAYS   insulin glargine 100 UNIT/ML injection Commonly known as:  LANTUS Inject 0.45 mLs (45 Units total) into the skin at bedtime for 120 doses.   lidocaine 5 % Commonly known as:  LIDODERM Place 1 patch onto the skin daily. Remove & Discard patch within 12 hours or as directed by MD Start taking on:  05/11/2018   nystatin powder Commonly known as:  MYCOSTATIN/NYSTOP APPLY  1  GRAM TOPICALLY TWICE DAILY AS NEEDED   potassium chloride SA 20 MEQ tablet Commonly known as:  K-DUR,KLOR-CON Take 2 tablets (40 mEq total) by mouth 2 (two) times daily.   sucralfate 1 g tablet Commonly known as:  CARAFATE Take 1 tablet (1 g total) by mouth 4 (four) times daily -  with meals and at bedtime.   tiZANidine 4 MG tablet Commonly known as:  ZANAFLEX 1 tablet four times daily as needed for muscle spasms   tobramycin-dexamethasone ophthalmic ointment Commonly known as:  TOBRADEX Place 1 application into the right eye 2 (two) times daily at 10 am and 4 pm.   topiramate 100 MG tablet Commonly known as:  TOPAMAX Take 1 tablet (100 mg total) by mouth at bedtime.   torsemide 20 MG tablet Commonly known as:  DEMADEX Take 2 tablets (40 mg total) by mouth 2 (two) times daily.       Disposition and follow-up:   [name]. was discharged from [facility] in [place] condition.  At the hospital  follow up visit please address:  1.  (A) Chronic Hypercapnia respiratory distress: Please Continue home oxygen supplementation at 3L        (B)Combined systolic and diastolic  heart failure       (C)Atrial Fibrillation         Continue these medications -->         Apixiban 5 mg BID        Atrovastatin 40 mg daily       Amiodarone 400 mg BID       Coreg 12.5 mg BID       Potassium 40 meq BID      Torsemide 40 mg BID       Follow up with Heart failure clinic on 05/20/2018 at 10AM      (D) To PCP: Please re-assess use of Colchicine and Hydrocodone. He did not receive any of these medications during this admission and I recommend discontinuing these medications. If he does require medication for gout, please consider a urate lowering medication (such as allopurinol).  2.  Labs / imaging needed at time of follow-up:  BMET, EKG at follow up  3.  Pending labs/ test needing follow-up: None   Follow-up Appointments:  Contact information for follow-up providers    [place] Follow up on 05/20/2018.   Specialty:  Cardiology Why:  Heart Failure Followup @ [facility]10[facility]/Dropoff at ER lot (Enter under new "Specialty Clinics" awning) or under [place] on [place] ([employer], [place] Code: 5427, elevator 1st floor). Take all am meds, bring all med bottles. Contact information: [address] [id number] [place] [phone]           Contact information for after-discharge care    Destination    [facility] .   Service:  Skilled Nursing Contact information: [address] [place] Hospital Course by problem list: 1. Acute metabolic encephalopathy 2/2 acute on chronic hypercapnic respiratory failure: [name] is a 57 year old obese gentleman with medical history significant for hypercapnic  respiratory failure, combined systolic and diastolic heart failure EF 55-60%% 2019, CVA with residual right facial palsy, right vision changes and right eye pain, CAD status post CABG in 2009, atrial fibrillation, hypertension and diabetes who presented with a 1-2 day history of worsening SOB, somnolence and confusion. On arrival to the ED, he was found to be in respiratory distress with an initial ABG which revealed ph7.24, pCO2 77, bicarb 33, pO2 88. He was subsequently placed on BiPAP for 6 hours however repeat ABG showed no significant change with pH 7.23, CO2 76,  bicarb 32, pO2 93. This was consistent with chronic hypercapnia. Of note, he is chronically on 3L nasal [place]ula at home. During this admission, he required intermittent BiPAP and as needed night time BiPAP which improved his respiratory function. He was weaned off BiPAP and transitioned to 2.5L [place]  2. 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 He received IV lasix and responded and improved clinically. He had inacurate weight measurements and it was unclear that his dry weight was but his lower extremity edema had resolved and he did not appear to have crackles on lung exams. He underwent TEE with cardioversion on 05/08/2018 and it showed an EF 55%-60%, RV pressure/volume overload, Mildly dilated RV with mildly decreased systolic function, Mild right atrial enlargement.   Per cardiology recommendation, he is to be discharged with these medications:  Apixiban 5 mg BID Atrovastatin 40 mg daily Amiodarone 400 mg BID Coreg 12.5 mg BID Potassium 40 meq BID Torsemide 40 mg BID  Heart Failure Clinic Follow up as an outpatient: 05/20/18 10 am in HF clinic  3. Atrial Fibrillation s/p Cardioversion on 05/08/18:  -He was started on Coreg 6.25 mg which was increased to 12.5mg  BID.  Cardiology started IV amiodarone for rhythm control and was transitioned to oral at discharge  4. Acute on Chronic Kidney Injury: His recorded baseline Cr. Of 1.4 in 01/2017 when he was admitted for CHF exacerbation. Initial BMP showed Cr. Of 4.19 which improved with diuresis. At discharge, renal function had improved and creatinine was stable at 2.06  5. Type 2 Diabetes Mellitus: Chart review reveals significant change in his Hgb A1c from 6.6 last year to 10.5 on admission. He is on glipizide at home. During this admission, he received insulin (Lantus 48U) with sliding scale. Plan is to discharge patient with Lantus 45U qhs and have close follow up with PCP.   Discharge Vitals:   BP (!) 133/58   Pulse 70   Temp 97.8 F (36.6 C) (Oral)   Resp (!) 24   Ht 5\' 10"  (1.778 m)   Wt (!) 181 kg   SpO2 93%   BMI 57.25 kg/m   Pertinent Labs, Studies, and Procedures:  TEE with cardioversion on 05/08/18 Normal left ventricular size with mild LV hypertrophy.  EF 55-60% with normal wall motion.  D-shaped interventricular septum suggestive of RV pressure/volume overload.  Mildly dilated RV with mildly decreased systolic function.  Trivial TR, could not obtain complete doppler jet so no PA pressure estimation available.  Mild right atrial enlargement.  Mild left atrial enlargement with no LA appendage thrombus.  No PFO or ASD by color doppler.  Trivial MR.  Trileaflet, mildly calcified aortic valve with no stenosis or regurgitation. Normal caliber aorta with mild plaque.   Impression: No LA thrombus  BMP Latest Ref Rng & Units 05/10/2018 05/09/2018 05/08/2018  Glucose 70 - 99 mg/dL 202(H) 198(H) 201(H)  BUN 6 - 20 mg/dL 32(H) 37(H) 39(H)  Creatinine 0.61 - 1.24 mg/dL 2.06(H) 2.20(H) 2.21(H)  Sodium 135 - 145 mmol/L 138 139 137  Potassium 3.5 - 5.1 mmol/L 4.0 3.5 3.4(L)  Chloride 98 - 111 mmol/L 93(L) 91(L) 87(L)  CO2 22 - 32 mmol/L 34(H) 37(H) 35(H)  Calcium 8.9 - 10.3 mg/dL 8.8(L) 8.8(L) 8.9      Discharge Instructions: Discharge Instructions    Call MD for:  difficulty breathing, headache  or visual disturbances   Complete by:  As directed    Call MD for:  extreme fatigue   Complete by:  As directed    Call MD for:  persistant dizziness or light-headedness   Complete by:  As directed    Call MD for:  persistant nausea and vomiting   Complete by:  As directed    Call MD for:  severe uncontrolled pain   Complete by:  As directed    Diet - low sodium heart healthy   Complete by:  As directed    Discharge instructions   Complete by:  As directed    [name],  It was a pleasure taking care of you at the hospital. You were admitted for difficulty breathing, decrease in your mental status, flare up on your heart failure and atrial fibrillation. We treated you with fluid pills and got your heart back in rhythm with medication and shocks. You are being discharged to [facility] place for rehab and I have the medications you should take on your discharge instructions:   For your heart, here are the medications:  Apixiban 5 mg twice a day Atrovastatin 40 mg daily Amiodarone 400 mg twice a day Coreg 12.5 mg twice a day  Potassium 40 meq twice a day  Torsemide 40 mg twice a day Aspirin 81mg  daily   You have heart failure clinic follow up on 05/20/18 10 am in HF clinic  Please follow up with Dr. Arelia Sneddon  ~Take Care  Dr. Eileen Stanford   Increase activity slowly   Complete by:  As directed       Signed: Jean Rosenthal, MD 05/10/2018, 2:13 PM   Pager: [phone] IMTS PGY-1

## 2018-05-03 NOTE — Progress Notes (Signed)
Subjective:   Mr. Meno was examined and evaluated at bedside this AM. Observed with signs of recent vomitus on his shirt. States his bed is uncomfortable after switching to pressure-bed. States the oxygen mask bothers him.  Left leg pain improving. He is AAOx3 to year location, name. Denies any chest pain, headache, dyspnea. Endorsed some vomiting yesterday and this morning of clear color, thin considstency. Denies Hematemesis. Urinating without issues.   Objective:  Vital signs in last 24 hours: Vitals:   05/02/18 2021 05/02/18 2038 05/03/18 0038 05/03/18 0600  BP: (!) 142/80 (!) 142/80 (!) 148/83 136/67  Pulse:  78 (!) 103 65  Resp:   (!) 27 (!) 27  Temp: 98.3 F (36.8 C)  98.5 F (36.9 C) 98.5 F (36.9 C)  TempSrc:   Oral Oral  SpO2: 92% (!) 89% (!) 76% 90%  Weight:      Height:       Const: AAOx3, In NAD, obese, speaking in full sentences CV: TAchycardic, no MGR Resp: Moderate wheezes, O2 saturation of decrease to 78% when weaned to 3L. Improved to 90s on 4L Abd: Distended due to body habitus, BS+ Ext: Edema improved (Trace), stasis dermatitis  Assessment/Plan:  Active Problems:   Acute metabolic encephalopathy  Mr. Maestas is a 57 year old obese gentleman with medical history significant for hypercapnic respiratory failure, combined systolic and diastolic heart failureEF 52% 2018,CVA with residual right facial palsy,right vision changesand right eye pain, CAD status post CABG in 2009, atrial fibrillation, hypertension and diabetes here for management of acute metabolic encephalopathy secondary to hypercapnic respiratory acidosis and suspected CHF exacerbation.    Acute metabolic encephalopathy 2/2 acute on chronic hypercapnic respiratory failure: Much improvement noted today on assessment. AAO x3 (Name, person and year). Did not require BiPAP overnight. His O2 saturation decreased to 78% when he was weaned to 3L but improved to mid-90s on 4L. ABG from yesterday  with improved pCO2 and pH in the range compatible with chronic hypercapnic respiratory acidosis.  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 However, his lower extremity edema has significantly improved and there is no crackles on pulmonary exam. Also, his BMP revealed gradually increasing bicarb which could be indicative of contraction alkalosis vs. compensation from respiratory acidosis, though unlikely because of improvement in ABG.   -Continue diuresis with IV Lasix 80mg  BID for now and get HF on board. -Telemetry -Heart failure team on board -Daily weights -Strict I/Os  Atrial Fibrillation: HR in this morning on tele in the 100-130s. Previously on atenolol but discontinued yesterday. I am hesistent to start a another beta-clocker or a non-dihydropyridine as I'm unsure of his current EF and also unsure if he has ischemic disease. The team had discussion for starting IV amiodarone drip or digoxin but cant make decision without available current heart structure and function. He requires wither Cardiac MRI or TEE -Heart Failure Team on board   Hypokalemia: K+ of 2.9 this AM -Replete   AoCKD: Improving. Cr. 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  -Pending Hepatitis B and hepatitis C screening.   DPO:EUMPN Healthy, replace electrolytes as needed DVT TIR:[id number]  Code status:FULL code  Dispo: Anticipated discharge to SNF per physical and occupational therapy.  Nathanial Rancher, MD IMTS PGY-1

## 2018-05-03 NOTE — Consult Note (Signed)
Cardiology Consultation:   Patient ID: [name].; [id number]; 1961/07/15   Admit date: 04/30/2018 Date of Consult: 05/03/2018  Primary Care Provider: Leonard Downing, MD Primary Cardiologist: Dr. Gwenlyn Found - last seen in 2017 Primary Electrophysiologist:  None   Patient Profile:   [name]. is a 57 y.o. male with a PMH of CAD s/p bypass in 2009, atrial fibrillation, chronic combined CHF, HTN, CVA with residual right facial palsy, acoustic neuroma s/p resection in 2014, DM type 2, chronic hypercapnic respiratory failure on home O2 3L, and morbid obesity, who is being seen today for the evaluation of CHF and atrial fibrillation at the request of Dr. Lynnae January.  History of Present Illness:   Mr. Rodenberg, who has a complicated past medical history, presented to the hospital on 04/30/18 with altered mental status. Per chart review, his wife stated that he was in his usual state of health until 04/30/18 when he began having SOB which worsened throughout the day with increased fatigue/lethargy. He was admitted to the medicine team for acute metabolic encephalopathy with acute on chronic respiratory acidosis, and acute on chronic combined CHF. He was placed on BiPAP and started on IV lasix 80mg  BID for pulmonary edema on CXR and volume overload on exam. He was found to have acute on chronic renal insufficiency on admission with Cr 4.19 (baseline 1.5). Troponin was mildly elevated to 0.18 and trended down, EKG non-ischemic; felt to be 2/2 demand ischemia in the setting of acute on chronic CHF and AKI.   He was last seen by cardiology while admitted to the hospital in 01/2017, at which time he was diagnosed with new onset atrial fibrillation, at which time he was started on diltiazem 120mg  and apixaban 5mg  BID. He was recommended to follow-up with Dr. Gwenlyn Found outpatient (who he had not seen since a pre-op assessment in 2017), but was lost to follow-up.   He reports feeling overall  better since arrival to the ED. Breathing has improved. He denies any complaints of chest pain. He is fairly sedentary at baseline but is able to ambulate around his home with a walker. Suspect there is a significant amount of dietary indiscretion as the patient and his wife seemed unaware of the importance of a low salt diet. Wife also states that he drinks a significant amount of water during the day. Patient states he has been complaint with his home lasix, 80mg  BID, but does not weight himself.   HR has been elevated to 140s today and BP consistently elevated. Still with increased O2 demands but has been weaned off BiPAP. He is continuing to diurese with IV lasix 80mg  BID with -733mL UOP in the past 24 hours and -3.1L this admission although appears to have incomplete intake documentation. Cr has improved to 2.87 today with diuresis. Daily weights have not been obtained.   Past Medical History:  Diagnosis Date  . Brain tumor ([place])    Takes Topamax so patient will not have headaches  . Bruises easily   . CAD (coronary artery disease)    Cath 09, 99% LAD  . Cardiomyopathy, ischemic    Ischemic  . CHF (congestive heart failure) ([place])   . Cyst near coccyx   . Cyst near tailbone   . Diabetes mellitus without complication (HCC)    borderline  . Edema of both legs    Takes Lasix  . Fibromyalgia   . Gout   . Headache(784.0)    with lights  .  Hypertension    dr Percival Spanish  . Kidney stones   . Myocardial infarction ([place])   . Obesity   . Pneumonia    hx of  . Shortness of breath   . Stroke [facility])    Affected vision, walking, and facial drooping on right face    Past Surgical History:  Procedure Laterality Date  . 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 ANAL FISTULECTOMY    . CORONARY ARTERY BYPASS GRAFT     LIMA to LAD 2009  . CRANIOTOMY Right 11/20/2012    Procedure: CRANIOTOMY REPAIR DURAL/CENTRAL SPINAL FLUID LEAK;  Surgeon: [name], MD;  Location: [place] NEURO ORS;  Service: Neurosurgery;  Laterality: Right;  . EYE SURGERY     to coorect lid and double vision  . MEDIAN RECTUS REPAIR Right 02/16/2016   Procedure: RIGHT LATERAL RECTUS RESECTION; SUPERIOR RECTUS RESECTION RIGHT EYE; RIGHT SUPERIOR RECTUS RECESSION; LATERAL TARSAL STRIP RIGHT LOWER EYELID;  Surgeon: [name], MD;  Location: [place];  Service: Ophthalmology;  Laterality: Right;  . PLACEMENT OF LUMBAR DRAIN N/A 11/20/2012   Procedure: Attempted PLACEMENT OF LUMBAR DRAIN;  Surgeon: [name], MD;  Location: [place] NEURO ORS;  Service: Neurosurgery;  Laterality: N/A;  . RETROSIGMOID CRANIECTOMY FOR TUMOR RESECTION Right 10/11/2012   Procedure: RETROSIGMOID CRANIECTOMY FOR TUMOR RESECTION;  Surgeon: [name], MD;  Location: [place] NEURO ORS;  Service: Neurosurgery;  Laterality: Right;  Craniotomy for acoustic neuroma  . TRACHEOSTOMY TUBE PLACEMENT N/A 10/11/2012   Procedure: TRACHEOSTOMY;  Surgeon: Ascencion Dike, MD;  Location: [facility];  Service: ENT;  Laterality: N/A;  . UMBILICAL HERNIA REPAIR       Home Medications:  Prior to Admission medications   Medication Sig Start Date End Date Taking? Authorizing Provider  artificial tears (LACRILUBE) OINT ophthalmic ointment Place 1 application into both eyes at bedtime as needed for dry eyes.    Yes [provider]  atenolol (TENORMIN) 50 MG tablet Take 1 tablet (50 mg total) by mouth 2 (two) times daily. Note increased dose. 11/12/12  Yes Love, Ivan Anchors, PA-C  carbamazepine (TEGRETOL) 200 MG tablet Take 1 tablet (200 mg total) by mouth 2 (two) times daily. Take once at night for 4 days then twice daily thereafter 09/10/17  Yes Meredith Staggers, MD  furosemide (LASIX) 80 MG tablet Take 160 mg by mouth daily. 04/26/18  Yes [provider]  HYDROcodone-acetaminophen (NORCO) 10-325 MG tablet TAKE 1 TABLET BY MOUTH EVERY 6  HOURS AS NEEDED FOR PAIN MUST LAST 30 DAYS 03/18/18  Yes Bayard Hugger, NP  isosorbide mononitrate (IMDUR) 30 MG 24 hr tablet Take 30 mg by mouth 2 (two) times daily.   Yes [provider]  albuterol (PROVENTIL HFA;VENTOLIN HFA) 108 (90 BASE) MCG/ACT inhaler Inhale 2 puffs into the lungs every 6 (six) hours as needed for wheezing or shortness of breath. Patient not taking: Reported on 04/30/2018 11/12/12   Love, Ivan Anchors, PA-C  apixaban (ELIQUIS) 5 MG TABS tablet Take 1 tablet (5 mg total) by mouth 2 (two) times daily. Patient not taking: Reported on 04/30/2018 02/03/17   Reyne Dumas, MD  colchicine 0.6 MG tablet Take 1 tablet (0.6 mg total) by mouth daily. Patient not taking: Reported on 04/30/2018 11/12/12   Love, Ivan Anchors, PA-C  diltiazem (CARDIZEM CD) 180 MG 24 hr  capsule Take 1 capsule (180 mg total) by mouth daily. Patient not taking: Reported on 04/30/2018 02/03/17   Reyne Dumas, MD  furosemide (LASIX) 40 MG tablet Take 1 tablet (40 mg total) by mouth 2 (two) times daily. Patient not taking: Reported on 04/30/2018 02/03/17   Reyne Dumas, MD  glipiZIDE (GLUCOTROL) 5 MG tablet Take 0.5 tablets (2.5 mg total) by mouth daily before breakfast. Patient not taking: Reported on 04/30/2018 02/03/17   Reyne Dumas, MD  nystatin (MYCOSTATIN/NYSTOP) powder APPLY  1  GRAM TOPICALLY TWICE DAILY AS NEEDED Patient not taking: Reported on 04/30/2018 04/08/18   Meredith Staggers, MD  sucralfate (CARAFATE) 1 g tablet Take 1 tablet (1 g total) by mouth 4 (four) times daily -  with meals and at bedtime. Patient not taking: Reported on 04/30/2018 02/23/16   Kelvin Cellar, MD  tiZANidine (ZANAFLEX) 4 MG tablet 1 tablet four times daily as needed for muscle spasms Patient not taking: Reported on 04/30/2018 01/15/18   Meredith Staggers, MD  tobramycin-dexamethasone [facility]) ophthalmic ointment Place 1 application into the right eye 2 (two) times daily at 10 am and 4 pm. Patient not taking: Reported on  04/30/2018 02/23/16   Kelvin Cellar, MD  topiramate (TOPAMAX) 100 MG tablet Take 1 tablet (100 mg total) by mouth at bedtime. Patient not taking: Reported on 04/30/2018 01/15/18   Meredith Staggers, MD    Inpatient Medications: Scheduled Meds: . apixaban  5 mg Oral BID  . furosemide  80 mg Intravenous BID  . insulin aspart  0-20 Units Subcutaneous TID WC  . insulin aspart  0-5 Units Subcutaneous QHS  . insulin glargine  25 Units Subcutaneous Daily  . ondansetron (ZOFRAN) IV  4 mg Intravenous Once   Continuous Infusions: . potassium chloride 10 mEq (05/03/18 1313)   PRN Meds: acetaminophen **OR** acetaminophen, albuterol, artificial tears, polyethylene glycol  Allergies:    Allergies  Allergen Reactions  . Morphine And Related Anaphylaxis    "makes me stop breathing"  . Gabapentin Other (See Comments)    Blood in urine  . Ivp Dye [Iodinated Diagnostic Agents]     Passing out  . Other     Steroids makes hearts race    Social History:   Social History   Socioeconomic History  . Marital status: Married    Spouse name: Not on file  . Number of children: Not on file  . Years of education: Not on file  . Highest education level: Not on file  Occupational History  . Not on file  Social Needs  . Financial resource strain: Not on file  . Food insecurity:    Worry: Not on file    Inability: Not on file  . Transportation needs:    Medical: Not on file    Non-medical: Not on file  Tobacco Use  . Smoking status: Light Tobacco Smoker    Packs/day: 0.25    Years: 20.00    Pack years: 5.00    Types: Cigarettes  . Smokeless tobacco: Current User  . Tobacco comment: Rare tobacco.  "[name]".  Substance and Sexual Activity  . Alcohol use: No    Alcohol/week: 0.0 standard drinks  . Drug use: No  . Sexual activity: Yes  Lifestyle  . Physical activity:    Days per week: Not on file    Minutes per session: Not on file  . Stress: Not on file  Relationships  .  Social connections:  Talks on phone: Not on file    Gets together: Not on file    Attends religious service: Not on file    Active member of club or organization: Not on file    Attends meetings of clubs or organizations: Not on file    Relationship status: Not on file  . Intimate partner violence:    Fear of current or ex partner: Not on file    Emotionally abused: Not on file    Physically abused: Not on file    Forced sexual activity: Not on file  Other Topics Concern  . Not on file  Social History Narrative  . Not on file    Family History:    Family History  Problem Relation Age of Onset  . CAD Father   . Hypertension Mother   . Cancer Other        multiple relatives     ROS:  Please see the history of present illness.   All other ROS reviewed and negative.     Physical Exam/Data:   Vitals:   05/03/18 0038 05/03/18 0600 05/03/18 1002 05/03/18 1509  BP: (!) 148/83 136/67 (!) 155/109   Pulse: (!) 103 65 (!) 116   Resp: (!) 27 (!) 27 (!) 26   Temp: 98.5 F (36.9 C) 98.5 F (36.9 C) 98.1 F (36.7 C) 98 F (36.7 C)  TempSrc: Oral Oral Oral   SpO2: (!) 76% 90% (!) 76%   Weight:      Height:        Intake/Output Summary (Last 24 hours) at 05/03/2018 1539 Last data filed at 05/03/2018 1500 Gross per 24 hour  Intake -  Output 2350 ml  Net -2350 ml   Filed Weights   05/01/18 0042 05/01/18 1400  Weight: (!) 172.4 kg (!) 174.5 kg   Body mass index is 55.2 kg/m.  General:  Morbidly obese male who appears chronically ill, laying in bed in NAD HEENT: sclera anicteric  Neck:  JVD difficult to assess due to body habitus Vascular: No carotid bruits; distal pulses 2+ bilaterally Cardiac:  normal S1, S2; RRR; no murmurs, rubs, or gallops Lungs:  clear to auscultation bilaterally, with scattered wheezing  Abd: NABS, soft, obese, nontender, no hepatomegaly Ext: no edema Musculoskeletal:  No deformities Skin: warm and dry; chronic venous stasis skin changes with  multiple ulcers Neuro:  Right sided facial drooping   EKG:  The EKG was personally reviewed and demonstrates:  Atrial fibrillation with controlled ventricular rate; non-specific ST-T wave abnormalities.  Telemetry:  Telemetry was personally reviewed and demonstrates:  Atrial fibrillation with rates persistently >100 in the past 24 hours.   Relevant CV Studies:  Echocardiogram 05/01/18: Impressions:  - Multiple views given, none of which allow reasonable   visualization of the cardiac chambers. This study is utterly   nondiagnostic. Would suggest TEE or cardiac MRI.  Echocardiogram 01/2017: Study Conclusions  - Left ventricle: EF appears normal but very poor image quality and   patient refused definity. The cavity size was normal. Wall   thickness was increased in a pattern of moderate LVH. Systolic   function was normal. The estimated ejection fraction was in the   range of 55% to 60%.  NST 2017:  The left ventricular ejection fraction is mildly decreased (45-54%).  Nuclear stress EF: 49%.  There was no ST segment deviation noted during stress.  The study is normal.   Normal stress nuclear study with no ischemia or infarction; EF 49  but visually appears better; suggest echocardiogram to further assess; mild LVE.  Laboratory Data:  Chemistry Recent Labs  Lab 05/01/18 1448 05/02/18 0651 05/03/18 0357  NA 139 143 143  K 3.7 3.6 2.9*  CL 96* 98 95*  CO2 31 34* 36*  GLUCOSE 324* 236* 232*  BUN 46* 48* 50*  CREATININE 3.58* 3.08* 2.87*  CALCIUM 8.9 9.0 8.6*  GFRNONAA 17* 21* 23*  GFRAA 20* 24* 26*  ANIONGAP 12 11 12     Recent Labs  Lab 05/01/18 1448 05/02/18 0651 05/03/18 0357  PROT 7.1 6.8 6.7  ALBUMIN 2.8* 2.5* 2.6*  AST 48* 36 36  ALT 67* 53* 45*  ALKPHOS 250* 212* 237*  BILITOT 1.1 1.3* 1.1   Hematology Recent Labs  Lab 04/30/18 2044 05/01/18 0527  WBC 12.3* 11.4*  RBC 4.44 4.48  HGB 14.0 14.1  HCT 47.5 47.2  MCV 107.0* 105.4*  MCH 31.5  31.5  MCHC 29.5* 29.9*  RDW 12.5 12.5  PLT 179 178   Cardiac Enzymes Recent Labs  Lab 05/01/18 0230 05/01/18 0527 05/01/18 1202  TROPONINI 0.10* 0.18* 0.16*    Recent Labs  Lab 04/30/18 2110  TROPIPOC 0.13*    BNP Recent Labs  Lab 04/30/18 2044  BNP 557.2*    DDimer No results for input(s): DDIMER in the last 168 hours.  Radiology/Studies:  Ct Head Wo Contrast  Result Date: 04/30/2018 CLINICAL DATA:  Altered LOC EXAM: CT HEAD WITHOUT CONTRAST TECHNIQUE: Contiguous axial images were obtained from the base of the skull through the vertex without intravenous contrast. COMPARISON:  01/28/2017 FINDINGS: Brain: No acute territorial infarction, hemorrhage, or intracranial mass is visualized. Encephalomalacia at the right CP angle consistent with prior mass resection. Extra-axial CSF density within the right posterior fossa, unchanged. Stable enlargement of fourth ventricle. Lateral ventricles are stable in size. Vascular: No hyperdense vessels.  Carotid vascular calcification Skull: Right suboccipital craniectomy and postsurgical changes of the posterior mastoid. No fracture Sinuses/Orbits: Mucous retention cyst left maxillary sinus. No acute orbital abnormality. Other: None IMPRESSION: 1. No CT evidence for acute intracranial abnormality. 2. Stable encephalomalacia of the right CP angle and operative changes of the posterior fossa. Electronically Signed   By: Donavan Foil M.D.   On: 04/30/2018 22:10   US Renal  Result Date: 05/01/2018 CLINICAL DATA:  Acute kidney injury, RIGHT upper quadrant pain, history CHF, coronary artery disease post MI, diabetes mellitus, hypertension, smoker EXAM: RENAL / URINARY TRACT ULTRASOUND COMPLETE COMPARISON:  01/28/2017 FINDINGS: Right Kidney: Length: 14.8 cm. Assessment limited secondary to body habitus. Normal cortical thickness and echogenicity. Scattered hyperechoic foci without shadowing, nonspecific, could represent nonshadowing calcifications or  nephrocalcinosis. No focal mass or gross hydronephrosis. Left Kidney: Length: 13.2 cm. Cortical thinning. Normal cortical echogenicity. No mass or hydronephrosis. Bladder: Partially distended by urine, unremarkable. Incidentally noted echogenic hepatic parenchyma, question fatty infiltration, though this can be seen with cirrhosis and certain infiltrative disorders. IMPRESSION: No evidence of hydronephrosis or gross renal mass. Scattered nonspecific mildly hyperechoic foci within the RIGHT kidney, could represent calcifications or nephrocalcinosis, suboptimally assessed due to body habitus. Probable fatty infiltration of liver as above. Electronically Signed   By: Lavonia Dana M.D.   On: 05/01/2018 01:29   Dg Chest Portable 1 View  Result Date: 04/30/2018 CLINICAL DATA:  Altered mental status, history coronary artery disease post MI, CHF, diabetes mellitus, hypertension, smoker, stroke EXAM: PORTABLE CHEST 1 VIEW COMPARISON:  Portable exam 2103 hours compared to 01/29/2017 FINDINGS: Enlargement of cardiac silhouette post  CABG. Pulmonary vascular congestion. Improved pulmonary edema since previous exam. No segmental consolidation, pleural effusion or pneumothorax. Bones unremarkable. IMPRESSION: Improved CHF. Electronically Signed   By: Lavonia Dana M.D.   On: 04/30/2018 21:28   US Abdomen Limited Ruq  Result Date: 05/01/2018 CLINICAL DATA:  RIGHT upper quadrant pain for 1 day EXAM: ULTRASOUND ABDOMEN LIMITED RIGHT UPPER QUADRANT COMPARISON:  01/28/2017 FINDINGS: Gallbladder: Incompletely distended. Shadowing calculi up to 6 mm diameter within gallbladder. No gross gallbladder wall thickening or pericholecystic fluid. No sonographic Murphy sign. Common bile duct: Diameter: Inadequately visualized due to body habitus Liver: Echogenic parenchyma, likely fatty infiltration though this can be seen with cirrhosis and certain infiltrative disorders. No gross evidence of a patent mass or nodularity though assessment  of intrahepatic detail is limited due to sound attenuation. Portal vein is patent on color Doppler imaging with normal direction of blood flow towards the liver. No RIGHT upper quadrant free fluid. IMPRESSION: Cholelithiasis. Probable fatty infiltration of liver as above. Inadequate assessment of CBD and intrahepatic detail due to sound attenuation and body habitus; if better RIGHT upper quadrant visualization is required, recommend CT. Electronically Signed   By: Lavonia Dana M.D.   On: 05/01/2018 01:31    Assessment and Plan:   1. Acute on chronic combined CHF: patient presented with AMS. CXR with pulmonary vascular congestion, thought to be improved from prior 01/2017. BNP 557, previously 394 on admission for CHF 01/2017.He was started on aggressive diuresis with IV lasix 80mg  BID with improvement in symptoms and Cr. Echo unfortunately was non-diagnostic.  - Monitor strict I&Os and daily weights - Limit fluid intake to <1.5L - Continue IV lasix 80mg  BID as symptoms and Cr continue to improve - Will start Toprol XL 25 mg daily - Will not pursue further ischemic work-up at this time.   2. Atrial fibrillation with RVR: Rate has been poorly controlled over the past 24 hours. Atenolol was held due to intermittent hypotension and CAD concerns. He is currently hypertensive and tachycardic. On eliquis for CHA2DS2-VASc Score of 6 (CHF, HTN, DM, Vascular, and prior stroke). Primary team is considering digoxin in the event that he has systolic dysfunction, however echo this admission was non-diagnostic. - Continue eliquis for stroke ppx   - Will start Toprol XL 25 mg daily now for rate control - titrate as tolerated.  - Would like to avoid digoxin and likely would have no benefit from amiodarone.  - Educated on dietary and fluid restrictions and daily weights.   3. Elevated troponin: troponin peaked at 0.18 and trended down. Felt to be 2/2 demand ischemia in the setting of acute on chronic CHF and significant  AKI. Unfortunately echo was non-diagnostic. No complaints of chest pain - No further ischemic evaluation at this time.   4. Acute on chronic renal insufficiency stage 3: Cr peaked at 4.19, baseline 1.4. Felt to be 2/2 volume overload. Cr has improved with aggressive diuresis and is 2.87 today.  - Continue to monitor closely with diuresis  5. Acute encephalopathy 2/2 hypercarbic respiratory failure: mental status has improved. Weaned off BiPAP. Still with some mild wheezing on exam. Seen by pulmonary this admission and recommended for sleep study outpatient - Continue management per primary team         For questions or updates, please contact [facility] Please consult [email or web address] for contact info under Cardiology/STEMI.   Signed, Abigail Butts, PA-C  05/03/2018 3:39 PM [phone]

## 2018-05-03 NOTE — Progress Notes (Signed)
Results for [name], [name] (MRN [id number]) as of 05/03/2018 13:41  Ref. Range 05/02/2018 14:24 05/02/2018 16:38 05/02/2018 22:02 05/03/2018 08:19 05/03/2018 11:54  Glucose-Capillary Latest Ref Range: 70 - 99 mg/dL 213 (H) 234 (H) 237 (H) 255 (H) 325 (H)  Noted that blood sugars have been elevated. Recommend increasing Lantus dosage to 35 units daily. Will continue to monitor blood sugars while in the hospital.  Harvel Ricks RN BSN CDE Diabetes Coordinator Pager: [phone]  8am-5pm

## 2018-05-04 DIAGNOSIS — I5031 Acute diastolic (congestive) heart failure: Secondary | ICD-10-CM | POA: Diagnosis present

## 2018-05-04 LAB — COMPREHENSIVE METABOLIC PANEL
ALT: 36 U/L (ref 0–44)
AST: 26 U/L (ref 15–41)
Albumin: 2.6 g/dL — ABNORMAL LOW (ref 3.5–5.0)
Alkaline Phosphatase: 220 U/L — ABNORMAL HIGH (ref 38–126)
Anion gap: 17 — ABNORMAL HIGH (ref 5–15)
BILIRUBIN TOTAL: 1.3 mg/dL — AB (ref 0.3–1.2)
BUN: 42 mg/dL — AB (ref 6–20)
CO2: 34 mmol/L — ABNORMAL HIGH (ref 22–32)
Calcium: 9 mg/dL (ref 8.9–10.3)
Chloride: 91 mmol/L — ABNORMAL LOW (ref 98–111)
Creatinine, Ser: 2.22 mg/dL — ABNORMAL HIGH (ref 0.61–1.24)
GFR, EST AFRICAN AMERICAN: 36 mL/min — AB (ref >60)
GFR, EST NON AFRICAN AMERICAN: 31 mL/min — AB (ref >60)
Glucose, Bld: 214 mg/dL — ABNORMAL HIGH (ref 70–99)
POTASSIUM: 3.3 mmol/L — AB (ref 3.5–5.1)
Sodium: 142 mmol/L (ref 135–145)
TOTAL PROTEIN: 6.7 g/dL (ref 6.5–8.1)

## 2018-05-04 LAB — GLUCOSE, CAPILLARY
GLUCOSE-CAPILLARY: 233 mg/dL — AB (ref 70–99)
Glucose-Capillary: 233 mg/dL — ABNORMAL HIGH (ref 70–99)
Glucose-Capillary: 211 mg/dL — ABNORMAL HIGH (ref 70–99)
Glucose-Capillary: 159 mg/dL — ABNORMAL HIGH (ref 70–99)

## 2018-05-04 MED ORDER — INSULIN GLARGINE 100 UNIT/ML ~~LOC~~ SOLN
30.0000 [IU] | Freq: Every day | SUBCUTANEOUS | Status: DC
  Administered 2018-05-05 – 2018-05-06 (×2): 30 [IU] via SUBCUTANEOUS
  Filled 2018-05-04 (×2): qty 0.3

## 2018-05-04 MED ORDER — POTASSIUM CHLORIDE CRYS ER 20 MEQ PO TBCR
40.0000 meq | EXTENDED_RELEASE_TABLET | Freq: Three times a day (TID) | ORAL | Status: AC
  Administered 2018-05-04 – 2018-05-05 (×3): 40 meq via ORAL
  Filled 2018-05-04 (×3): qty 2

## 2018-05-04 NOTE — Progress Notes (Signed)
  Date: 05/04/2018  Patient name: [name].  Medical record number: [id number]  Date of birth: [birth year]   I have seen and evaluated this patient and I have discussed the plan of care with the house staff. Please see their note for complete details. I concur with their findings with the following additions/corrections:   Continuing diuresis for acute decompensated HFpEF (presumed, difficult to assess given body habitus).  Creatinine continues to improve, otherwise difficult to assess response to diuresis as weight, JVP not assessable.  We may be able to get some sense of his volume status with POC [place] assessment of his IVC, estimated CVP from his TTE on 9/11 was 8.  Rate control has overall improved, primarily in the low 100s.  Continuing carvedilol (switched from atenolol this admission).  Lenice Pressman, M.D., Ph.D. 05/04/2018, 5:05 PM

## 2018-05-04 NOTE — Progress Notes (Signed)
Respiratory called with some panic values from blood gas this evening.  Paged internal medicine and reported values.

## 2018-05-04 NOTE — Progress Notes (Signed)
   Subjective:   [name] was seen on morning rounds. He states he feels okay this morning and that his breathing seems to be improved. He asks how much fluid he can drink and was informed he is being restricted as we are trying to pull fluid off. He complains about his BiPAP, but he was told we would need to continue it at night as it has been helping him. He has no other complaint or questions this morning.  Objective:  Vital signs in last 24 hours: Vitals:   05/04/18 0822 05/04/18 0900 05/04/18 1000 05/04/18 1100  BP:      Pulse:  72 (!) 105 (!) 110  Resp:  (!) 31 (!) 21 (!) 29  Temp: 98 F (36.7 C)     TempSrc: Oral     SpO2:  93% 93% 96%  Weight:      Height:       Const: AAOx3, In NAD, obese, speaking in full sentences CV: Tachycardic, no M/G/R Resp: Moderate wheezes, O2 saturation of decrease to 78% when weaned to 3L. Improved to 90s on 4L Abd: Distended due to body habitus, BS+ Ext: Edema improved (Trace), stasis dermatitis  Assessment/Plan:  Active Problems:   Acute metabolic encephalopathy   RUQ pain   Atrial fibrillation with RVR [facility])  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   Acute metabolic encephalopathy 2/2 acute on chronic hypercapnic respiratory failure: Remains improved today. Some BiPAP required overnight.  - Continue Speed as tolerated  - BiPAP prn  - If worsening mental status, obtain ABG  Acute on chronic combined systolic and diastolic heart failure exacerbation: Unable to access accuracy of diuresis at this point due to no measured  weight and inaccurate I/Os. However, his lower extremity edema has significantly improved and there is no crackles on pulmonary exam. BMP has revealed gradually increasing bicarb which could be indicative of contraction alkalosis.   - Appreciate Cardiology recomendations - Continue diuresis with IV Lasix 80mg  BID - Continue Coreg - Telemetry - Daily weights - Strict I/Os  Atrial Fibrillation: HR this morning, 100-130s. Atenolol switched to Coreg per Cards. > EF remains unclear due to body habitus limiting exams > Have discussed starting IV amiodarone drip or digoxin but cant make decision without available current heart structure and function. He requires wither Cardiac MRI or TEE - Heart Failure Team on board  Hypokalemia: K+ of 3.3 this AM - Replete 40 mEq x 3 - Re-Check Mg  AoCKD: Improving. Cr. 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  - Pending Hepatitis B and hepatitis C screening.   ZGY:FVCBS Healthy, replace electrolytes as needed DVT WHQ:PRFFMBW  Code status:FULL code  Dispo: Anticipated discharge to SNF per physical and occupational therapy.  Pearson Grippe, DO IM PGY-2 Pager: [phone]

## 2018-05-04 NOTE — Progress Notes (Signed)
HF service unavailable today. Will ask that they consult on this patient tomorrow (as per internal medicine's request).  For now, continue IV Lasix 80 mg bid as he appears to have adequate urine output with this dose.

## 2018-05-05 LAB — COMPREHENSIVE METABOLIC PANEL
ALK PHOS: 199 U/L — AB (ref 38–126)
ALT: 33 U/L (ref 0–44)
AST: 28 U/L (ref 15–41)
Albumin: 2.7 g/dL — ABNORMAL LOW (ref 3.5–5.0)
Anion gap: 13 (ref 5–15)
BUN: 43 mg/dL — ABNORMAL HIGH (ref 6–20)
CO2: 33 mmol/L — ABNORMAL HIGH (ref 22–32)
Calcium: 8.8 mg/dL — ABNORMAL LOW (ref 8.9–10.3)
Chloride: 95 mmol/L — ABNORMAL LOW (ref 98–111)
Creatinine, Ser: 2.15 mg/dL — ABNORMAL HIGH (ref 0.61–1.24)
GFR calc Af Amer: 37 mL/min — ABNORMAL LOW (ref >60)
GFR calc non Af Amer: 32 mL/min — ABNORMAL LOW (ref >60)
Glucose, Bld: 216 mg/dL — ABNORMAL HIGH (ref 70–99)
Potassium: 4.1 mmol/L (ref 3.5–5.1)
Sodium: 141 mmol/L (ref 135–145)
Total Bilirubin: 1.4 mg/dL — ABNORMAL HIGH (ref 0.3–1.2)
Total Protein: 6.6 g/dL (ref 6.5–8.1)

## 2018-05-05 LAB — MAGNESIUM: MAGNESIUM: 1.9 mg/dL (ref 1.7–2.4)

## 2018-05-05 LAB — GLUCOSE, CAPILLARY
GLUCOSE-CAPILLARY: 225 mg/dL — AB (ref 70–99)
Glucose-Capillary: 207 mg/dL — ABNORMAL HIGH (ref 70–99)
Glucose-Capillary: 224 mg/dL — ABNORMAL HIGH (ref 70–99)
Glucose-Capillary: 219 mg/dL — ABNORMAL HIGH (ref 70–99)

## 2018-05-05 MED ORDER — AMIODARONE LOAD VIA INFUSION
150.0000 mg | Freq: Once | INTRAVENOUS | Status: AC
  Administered 2018-05-05: 150 mg via INTRAVENOUS
  Filled 2018-05-05: qty 83.34

## 2018-05-05 MED ORDER — AMIODARONE HCL IN DEXTROSE 360-4.14 MG/200ML-% IV SOLN
60.0000 mg/h | INTRAVENOUS | Status: AC
  Administered 2018-05-05: 60 mg/h via INTRAVENOUS
  Filled 2018-05-05: qty 200

## 2018-05-05 MED ORDER — CARVEDILOL 12.5 MG PO TABS
12.5000 mg | ORAL_TABLET | Freq: Two times a day (BID) | ORAL | Status: DC
  Administered 2018-05-05 – 2018-05-10 (×10): 12.5 mg via ORAL
  Filled 2018-05-05 (×10): qty 1

## 2018-05-05 MED ORDER — METOPROLOL TARTRATE 5 MG/5ML IV SOLN
2.5000 mg | Freq: Once | INTRAVENOUS | Status: AC
  Administered 2018-05-05: 2.5 mg via INTRAVENOUS
  Filled 2018-05-05: qty 5

## 2018-05-05 MED ORDER — AMIODARONE HCL IN DEXTROSE 360-4.14 MG/200ML-% IV SOLN
30.0000 mg/h | INTRAVENOUS | Status: DC
  Administered 2018-05-06 – 2018-05-08 (×5): 30 mg/h via INTRAVENOUS
  Filled 2018-05-05 (×9): qty 200

## 2018-05-05 NOTE — Progress Notes (Signed)
Patient heart rate increasing to the 150-160's.  Heart rate sustaining in the 130's.  Patient is resting on his BiPAP. Asymptomatic.  Notified the MD on call. Will continue to monitor the patient

## 2018-05-05 NOTE — Progress Notes (Addendum)
   Subjective:   Overnight: Atrial fibrillation with RVR and received IV metoprolol 2.5mg   Today, [name] was examined at bedside and complained of left foot pain around his wound site and asked for an ice pad. He also complained of feeling thirsty and inquired about disposition. I explained to him that we had to get his heart failure under control and that we were waiting for the heart failure team to evaluate him as well. Currently, he denies shortness of breath, palpitation, dysrhythmia and lower extremity swelling. He was able to sleep somewhat flat on the bed with no difficulties.   Objective:  Vital signs in last 24 hours: Vitals:   05/05/18 0031 05/05/18 0339 05/05/18 0431 05/05/18 0829  BP:   121/67 120/84  Pulse: 95 (!) 116  (!) 115  Resp: (!) 22 20    Temp:   99.4 F (37.4 C)   TempSrc:   Axillary   SpO2: 95% 96%    Weight:   (!) 194.1 kg   Height:       Const: Alert and oriented, in NAD, lying in bed  CV: Irregular, normal S1 and S2, no MGR Resp: CTABL, no wheezes, crackles, rhonchi  Abd: BS+, non tender to palpation  Ext: Trace edema bilaterally, pressure ulcer the the left lower extremity laterally covered with Band-Aid, stasis dermatitis   Assessment/Plan:  Principal Problem:   Acute diastolic heart failure (HCC) Active Problems:   Pressure injury of skin   Acute metabolic encephalopathy   RUQ pain   Atrial fibrillation with RVR ([place])  [name] is a 57 year old obese gentleman with medical history significant for hypercapnic respiratory failure, combined systolic and diastolic heart failureEF 78% 2018,CVA with residual right facial palsy,right vision changesand right eye pain, CAD status post CABG in 2009, atrial fibrillation, hypertension and diabetes herefor management ofacute metabolic encephalopathy secondary to chronic hypercapnic respiratory acidosis and suspected CHF exacerbation.   Acute metabolic encephalopathy 2/2 acute on chronic  hypercapnic respiratory failure-improved: He remains alert and oriented with no evidence of confusion. States the only time be felt SOB was when the BiPAP was placed.   - Continue [place] as tolerated  - BiPAP prn  - If worsening mental status, obtain ABG  Acute on chronic combined systolic and diastolic heart failure exacerbation:He is responding to diuresis with net UOP of 2.8L. Today, his recorded weight is 194kg <<174.5kg (05/01/18). I believe this is inaccurate because he has been diuresing and also has clinical improvement with noticeable decrease in lower extremity edema. His lung exam remains unremarkable with no evidence of crackles.  - Appreciate Cardiology recomendations - Continue diuresis with IV Lasix 80mg  BID - Continue Coreg 6.25mg  BID -> increased to 12.5mg  BID - Telemetry - Daily weights - Strict I/Os  Atrial Fibrillation:Overnight, had episodes of A-fib with RVR and received IV Metoprolol 2.5mg . This morning, HR was in the 110s. HR remains irregular. -Will continue rate control with Coreg 6.25mg  BID -> increased to 12.5mg  BID  Hypokalemia: Resolved. K+  4.1<<3.3   AoCKD:Improving. Cr. 2.15<<2.22 [Baseline 1.4]  Type 2 Diabetes Mellitus:Lantus 30u daily and SSI  Abnormal lifer function test: Likely secondary to NASH.  - Pending Hepatitis B and hepatitis C screening.  LFY:BOFBP Healthy,replace electrolytes as needed DVT ZWC:HENIDPO  Code status:FULL code  Dispo: Anticipated discharge to SNF per physical and occupational therapy.  Jean Rosenthal, MD 05/05/2018, 9:11 AM Pager: [phone] IMTS PGY-1

## 2018-05-05 NOTE — Progress Notes (Signed)
Pt has refused bipap for the second time tonight.  RT entered at 9pm to speak with pt about wearing it and he states he will need his Hydrocodone and some other medication and explained [facility] has [facility] meds on file and we need to call them to get his meds. He also stated his O2 was fine on his [place] and didn't need to wear the bipap.  RT returned at 2310 to speak with pt about his bipap again and pt again went into the speech about him not wearing it unless he has his meds. He then made an outside call to find out what meds he was supposed to take.  The person on the line explained what meds he needs and when he is supposed to take them/  Pt continued to explain meds to RT even after I explained the RN would have to speak to him about those types of meds.  Pt has been talking throughout the duration of writing and filing this note.  At this time, Bipap is still not on pt due to his refusal.  RT will speak to RN and continue to monttor pt.

## 2018-05-05 NOTE — Progress Notes (Signed)
Patient ID: Frank Pat., male   DOB: 28-Jan-1961, 57 y.o.   MRN: [id number]    Advanced Heart Failure Team Consult Note   Primary Physician: Leonard Downing, MD PCP-Cardiologist:  No primary care provider on file.  Reason for Consultation: CHF  HPI:    [name]. is seen today for evaluation of CHF at the request of Dr. Lynnae January.   57 yo with history of CAD s/p CABG, chronic diastolic CHF, atrial fibrillation, obesity, prior CVA s/p acoustic neuroma surgery, HTN, and diabetes is seen for evaluation of CHF and atrial fibrillation. He had a single vessel CABG with LIMA-LAD in 2009.  In 6/18, he was in the hospital with diastolic CHF and atrial fibrillation with RVR.  He was started on Eliquis that time but not cardioverted.  After leaving the hospital, he did not continue apixaban and did not have cardiology followup.  According to his wife, his heart rate is generally in the 60s.    He was admitted 9/10 with increased dyspnea and mental status changes. He was thought to have acute metobolic encephalopathy with hypercapneic respiratory failure.  Creatinine initially 4.19, has decreased to 2.15 today.  Last prior creatinine was 1.4 in 6/18.  He was on Bipap initially. He has been in atrial fibrillation with RVR, was started on Coreg but HR still running high.  He has been diuresed with IV Lasix, good diuresis so far.    Review of Systems: All systems reviewed and negative except as per HPI.   Home Medications Prior to Admission medications   Medication Sig Start Date End Date Taking? Authorizing Provider  artificial tears (LACRILUBE) OINT ophthalmic ointment Place 1 application into both eyes at bedtime as needed for dry eyes.    Yes [provider]  atenolol (TENORMIN) 50 MG tablet Take 1 tablet (50 mg total) by mouth 2 (two) times daily. Note increased dose. 11/12/12  Yes Love, Ivan Anchors, PA-C  carbamazepine (TEGRETOL) 200 MG tablet Take 1 tablet (200 mg total)  by mouth 2 (two) times daily. Take once at night for 4 days then twice daily thereafter 09/10/17  Yes Meredith Staggers, MD  furosemide (LASIX) 80 MG tablet Take 160 mg by mouth daily. 04/26/18  Yes [provider]  HYDROcodone-acetaminophen (NORCO) 10-325 MG tablet TAKE 1 TABLET BY MOUTH EVERY 6 HOURS AS NEEDED FOR PAIN MUST LAST 30 DAYS 03/18/18  Yes Bayard Hugger, NP  isosorbide mononitrate (IMDUR) 30 MG 24 hr tablet Take 30 mg by mouth 2 (two) times daily.   Yes [provider]  albuterol (PROVENTIL HFA;VENTOLIN HFA) 108 (90 BASE) MCG/ACT inhaler Inhale 2 puffs into the lungs every 6 (six) hours as needed for wheezing or shortness of breath. Patient not taking: Reported on 04/30/2018 11/12/12   Love, Ivan Anchors, PA-C  apixaban (ELIQUIS) 5 MG TABS tablet Take 1 tablet (5 mg total) by mouth 2 (two) times daily. Patient not taking: Reported on 04/30/2018 02/03/17   Reyne Dumas, MD  colchicine 0.6 MG tablet Take 1 tablet (0.6 mg total) by mouth daily. Patient not taking: Reported on 04/30/2018 11/12/12   Love, Ivan Anchors, PA-C  diltiazem (CARDIZEM CD) 180 MG 24 hr capsule Take 1 capsule (180 mg total) by mouth daily. Patient not taking: Reported on 04/30/2018 02/03/17   Reyne Dumas, MD  furosemide (LASIX) 40 MG tablet Take 1 tablet (40 mg total) by mouth 2 (two) times daily. Patient not taking: Reported on 04/30/2018 02/03/17  Reyne Dumas, MD  glipiZIDE (GLUCOTROL) 5 MG tablet Take 0.5 tablets (2.5 mg total) by mouth daily before breakfast. Patient not taking: Reported on 04/30/2018 02/03/17   Reyne Dumas, MD  nystatin (MYCOSTATIN/NYSTOP) powder APPLY  1  GRAM TOPICALLY TWICE DAILY AS NEEDED Patient not taking: Reported on 04/30/2018 04/08/18   Meredith Staggers, MD  sucralfate (CARAFATE) 1 g tablet Take 1 tablet (1 g total) by mouth 4 (four) times daily -  with meals and at bedtime. Patient not taking: Reported on 04/30/2018 02/23/16   Kelvin Cellar, MD  tiZANidine (ZANAFLEX) 4 MG tablet  1 tablet four times daily as needed for muscle spasms Patient not taking: Reported on 04/30/2018 01/15/18   Meredith Staggers, MD  tobramycin-dexamethasone [facility]) ophthalmic ointment Place 1 application into the right eye 2 (two) times daily at 10 am and 4 pm. Patient not taking: Reported on 04/30/2018 02/23/16   Kelvin Cellar, MD  topiramate (TOPAMAX) 100 MG tablet Take 1 tablet (100 mg total) by mouth at bedtime. Patient not taking: Reported on 04/30/2018 01/15/18   Meredith Staggers, MD    Past Medical History: 1. CAD: s/p CABG x 1 with LIMA-LAD in 2009.  2. Atrial fibrillation: ?Chronic 3. 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  4. Morbid obesity.  5. Suspect OHS/OSA 6. Chronic diastolic CHF: Echo (4/31) with EF 55-60%, moderate LVH.  7. HTN 8. DM type 2.  9. CKD: Stage 3.   Past Surgical History: Past Surgical History:  Procedure Laterality Date  . 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 ANAL FISTULECTOMY    . CORONARY ARTERY BYPASS GRAFT     LIMA to LAD 2009  . CRANIOTOMY Right 11/20/2012   Procedure: CRANIOTOMY REPAIR DURAL/CENTRAL SPINAL FLUID LEAK;  Surgeon: [name], MD;  Location: [place] [facility];  Service: Neurosurgery;  Laterality: Right;  . EYE SURGERY     to coorect lid and double vision  . MEDIAN RECTUS REPAIR Right 02/16/2016   Procedure: RIGHT LATERAL RECTUS RESECTION; SUPERIOR RECTUS RESECTION RIGHT EYE; RIGHT SUPERIOR RECTUS RECESSION; LATERAL TARSAL STRIP RIGHT LOWER EYELID;  Surgeon: [name], MD;  Location: [place];  Service: Ophthalmology;  Laterality: Right;  . PLACEMENT OF LUMBAR DRAIN N/A 11/20/2012   Procedure: Attempted PLACEMENT OF LUMBAR DRAIN;  Surgeon: [name], MD;  Location: [place] [facility];  Service: Neurosurgery;  Laterality: N/A;  . RETROSIGMOID CRANIECTOMY FOR TUMOR  RESECTION Right 10/11/2012   Procedure: RETROSIGMOID CRANIECTOMY FOR TUMOR RESECTION;  Surgeon: [name], MD;  Location: [place] [facility];  Service: Neurosurgery;  Laterality: Right;  Craniotomy for acoustic neuroma  . TRACHEOSTOMY TUBE PLACEMENT N/A 10/11/2012   Procedure: TRACHEOSTOMY;  Surgeon: Ascencion Dike, MD;  Location: [facility];  Service: ENT;  Laterality: N/A;  . UMBILICAL HERNIA REPAIR      Family History: Family History  Problem Relation Age of Onset  . CAD Father   . Hypertension Mother   . Cancer Other        multiple relatives    Social History: Social History   Socioeconomic History  . Marital status: Married    Spouse name: Not on file  . Number of children: Not on file  . Years of education: Not on file  . Highest education level: Not on file  Occupational History  .  Not on file  Social Needs  . Financial resource strain: Not on file  . Food insecurity:    Worry: Not on file    Inability: Not on file  . Transportation needs:    Medical: Not on file    Non-medical: Not on file  Tobacco Use  . Smoking status: Light Tobacco Smoker    Packs/day: 0.25    Years: 20.00    Pack years: 5.00    Types: Cigarettes  . Smokeless tobacco: Current User  . Tobacco comment: Rare tobacco.  "[name]".  Substance and Sexual Activity  . Alcohol use: No    Alcohol/week: 0.0 standard drinks  . Drug use: No  . Sexual activity: Yes  Lifestyle  . Physical activity:    Days per week: Not on file    Minutes per session: Not on file  . Stress: Not on file  Relationships  . Social connections:    Talks on phone: Not on file    Gets together: Not on file    Attends religious service: Not on file    Active member of club or organization: Not on file    Attends meetings of clubs or organizations: Not on file    Relationship status: Not on file  Other Topics Concern  . Not on file  Social History Narrative  . Not on file    Allergies:  Allergies  Allergen  Reactions  . Morphine And Related Anaphylaxis    "makes me stop breathing"  . Gabapentin Other (See Comments)    Blood in urine  . Ivp Dye [Iodinated Diagnostic Agents]     Passing out  . Other     Steroids makes hearts race    Objective:    Vital Signs:   Temp:  [98.3 F (36.8 C)-99.9 F (37.7 C)] 99.4 F (37.4 C) (09/15 0431) Pulse Rate:  [95-116] 115 (09/15 0829) Resp:  [20-22] 20 (09/15 0339) BP: (90-139)/(50-88) 120/84 (09/15 0829) SpO2:  [92 %-96 %] 96 % (09/15 0339) Weight:  [194.1 kg] 194.1 kg (09/15 0431) Last BM Date: 05/05/18  Weight change: Filed Weights   05/01/18 0042 05/01/18 1400 05/05/18 0431  Weight: (!) 172.4 kg (!) 174.5 kg (!) 194.1 kg    Intake/Output:   Intake/Output Summary (Last 24 hours) at 05/05/2018 1618 Last data filed at 05/05/2018 1401 Gross per 24 hour  Intake 500 ml  Output 3875 ml  Net -3375 ml      Physical Exam    General:  Obese, NAD HEENT: normal Neck: Thick. JVP 10 cm. Carotids 2+ bilat; no bruits. No lymphadenopathy or thyromegaly appreciated. Cor: PMI nonpalpable. Mildly tachy, irregular rate & rhythm. No rubs, gallops or murmurs. Lungs: Mild crackles at bases.  Abdomen: soft, nontender, nondistended. No hepatosplenomegaly. No bruits or masses. Good bowel sounds. Extremities: no cyanosis, clubbing, rash. Trace ankle edema.  Neuro: alert & orientedx3, right facial droop.    Telemetry   Atrial fibrillation 110s (personally reviewed)  EKG    Atrial fibrillation rate 125, nonspecific T wave flattening.   Labs   Basic Metabolic Panel: Recent Labs  Lab 04/30/18 2044 05/01/18 0527 05/01/18 1448 05/02/18 0651 05/03/18 0357 05/04/18 0505 05/05/18 0349  NA 133* 137 139 143 143 142 141  K 4.3 3.8 3.7 3.6 2.9* 3.3* 4.1  CL 91* 93* 96* 98 95* 91* 95*  CO2 29 30 31  34* 36* 34* 33*  GLUCOSE 483* 343* 324* 236* 232* 214* 216*  BUN 43*  45* 46* 48* 50* 42* 43*  CREATININE 4.19* 3.99* 3.58* 3.08* 2.87* 2.22* 2.15*    CALCIUM 8.8* 8.8* 8.9 9.0 8.6* 9.0 8.8*  MG 2.4 2.4  --   --   --   --  1.9    Liver Function Tests: Recent Labs  Lab 05/01/18 1448 05/02/18 0651 05/03/18 0357 05/04/18 0505 05/05/18 0349  AST 48* 36 36 26 28  ALT 67* 53* 45* 36 33  ALKPHOS 250* 212* 237* 220* 199*  BILITOT 1.1 1.3* 1.1 1.3* 1.4*  PROT 7.1 6.8 6.7 6.7 6.6  ALBUMIN 2.8* 2.5* 2.6* 2.6* 2.7*   No results for input(s): LIPASE, AMYLASE in the last 168 hours. Recent Labs  Lab 04/30/18 2132  AMMONIA 50*    CBC: Recent Labs  Lab 04/30/18 2044 05/01/18 0527  WBC 12.3* 11.4*  HGB 14.0 14.1  HCT 47.5 47.2  MCV 107.0* 105.4*  PLT 179 178    Cardiac Enzymes: Recent Labs  Lab 04/30/18 2132 05/01/18 0230 05/01/18 0527 05/01/18 1202  CKTOTAL 32*  --   --   --   TROPONINI  --  0.10* 0.18* 0.16*    BNP: BNP (last 3 results) Recent Labs    04/30/18 2044  BNP 557.2*    ProBNP (last 3 results) No results for input(s): PROBNP in the last 8760 hours.   CBG: Recent Labs  Lab 05/04/18 1207 05/04/18 1709 05/04/18 2041 05/05/18 0819 05/05/18 1203  GLUCAP 233* 211* 159* 207* 224*    Coagulation Studies: No results for input(s): LABPROT, INR in the last 72 hours.   Imaging    No results found.   Medications:     Current Medications: . apixaban  5 mg Oral BID  . carvedilol  12.5 mg Oral BID WC  . furosemide  80 mg Intravenous BID  . insulin aspart  0-20 Units Subcutaneous TID WC  . insulin aspart  0-5 Units Subcutaneous QHS  . insulin glargine  30 Units Subcutaneous Daily     Infusions: . amiodarone     Followed by  . amiodarone         Patient Profile   57 yo with history of CAD s/p CABG, chronic diastolic CHF, atrial fibrillation, obesity, prior CVA s/p acoustic neuroma surgery, HTN, and diabetes is seen for evaluation of CHF and atrial fibrillation.  Assessment/Plan   1. Acute metabolic encephalopathy: With hypercapneic respiratory failure.  Now resolved, strong  suspicion for OHS/OSA.  - Needs outpatient sleep study.  2. Acute on chronic presumed diastolic CHF: He has been diuresed this admission with improvement.  Last echo from 6/18 with normal EF, echo this admission was uninterpretable.  Unlikely to be a candidate for cardiac MRI based on his size. Still some volume overload on exam.  - Would continue Lasix 80 mg IV bid today, reassess tomorrow.  - Needs evaluation of LV function => see below, will arrange TEE.  3. Atrial fibrillation: ?Chronic.  Had atrial fibrillation in 6/18, started on Eliquis but did not continue as outpatient.  No followup.  Per wife, HR in 32s at home.  It is possible he was in NSR then went back in atrial fibrillation with RVR, triggering CHF and admission.  However, now way of knowing whether he has been in afib chronically.  HR is not controlled on Coreg (110s-120s).  - Continue Eliquis (restarted at admission).  - I think he would benefit from NSR, will attempt TEE-guided DCCV, probably on Tuesday.  - Start amiodarone gtt  for rate control and also to try to hold him in NSR after DCCV.  - Continue Coreg for now.  4. ?AKI on CKD stage 3: Last creatinine in 6/18 was 1.4, admitted with creatinine 4.19.  Not sure of his recent baseline.  Creatinine trending down with diuresis, 2.15 currently.   - Continue Lasix as above.  5. CAD: S/p LIMA-LAD in 2009.  No chest pain.  Mild troponin elevation with no trend this admission.  Suspect demand ischemia with volume overload/CHF.  - No ASA given Eliquis use.   - Should be on statin, check lipids in am and will decide what agent and dose.   Length of Stay: 4  [name], MD  05/05/2018, 4:18 PM  Advanced Heart Failure Team Pager [phone] (M-F; 7a - 4p)  Please contact [facility] for night-coverage after hours (4p -7a ) and weekends on [email or web address]

## 2018-05-05 NOTE — Progress Notes (Signed)
  Date: 05/05/2018  Patient name: [name].  Medical record number: [id number]  Date of birth: 1960/09/25   I have seen and evaluated this patient and I have discussed the plan of care with the house staff. Please see their note for complete details. I concur with their findings with the following additions/corrections:   57 year old man with morbid obesity, CAD with CABG 2009, A. fib, CHF (reported HFrEF with EF 20% 2017), HTN, CVA, DM who was admitted for altered mental status and shortness of breath.  His AMS was likely due to hypercapnia and has improved with using BiPAP at night.  He also seemed to have acute exacerbation of his heart failure and has improved significantly with diuresis.  AKI on probable CKD has also improved with diuresis.  Appreciate Dr. Aundra Dubin from heart failure team evaluating him today.  Per his recommendations, we will continue diuresis and anticipate possible TEE with cardioversion on Tuesday.  Agree that his cardiac function would likely benefit from being in sinus rhythm.  Starting amiodarone now for rate control and to improve likelihood of success with cardioversion.  We will continue carvedilol and apixaban.  He also noted Mr. Geise is not on a statin, but clearly should be with his history of CAD and CABG.  We will start that as well.  Lenice Pressman, M.D., Ph.D. 05/05/2018, 5:25 PM

## 2018-05-06 DIAGNOSIS — N183 Chronic kidney disease, stage 3 (moderate): Secondary | ICD-10-CM

## 2018-05-06 DIAGNOSIS — M546 Pain in thoracic spine: Secondary | ICD-10-CM

## 2018-05-06 DIAGNOSIS — M79672 Pain in left foot: Secondary | ICD-10-CM

## 2018-05-06 DIAGNOSIS — G8929 Other chronic pain: Secondary | ICD-10-CM

## 2018-05-06 LAB — COMPREHENSIVE METABOLIC PANEL
ALT: 29 U/L (ref 0–44)
AST: 22 U/L (ref 15–41)
Albumin: 2.7 g/dL — ABNORMAL LOW (ref 3.5–5.0)
Alkaline Phosphatase: 201 U/L — ABNORMAL HIGH (ref 38–126)
Anion gap: 15 (ref 5–15)
BILIRUBIN TOTAL: 0.9 mg/dL (ref 0.3–1.2)
BUN: 44 mg/dL — ABNORMAL HIGH (ref 6–20)
CHLORIDE: 91 mmol/L — AB (ref 98–111)
CO2: 35 mmol/L — ABNORMAL HIGH (ref 22–32)
CREATININE: 2.3 mg/dL — AB (ref 0.61–1.24)
Calcium: 9.2 mg/dL (ref 8.9–10.3)
GFR calc Af Amer: 35 mL/min — ABNORMAL LOW (ref >60)
GFR, EST NON AFRICAN AMERICAN: 30 mL/min — AB (ref >60)
Glucose, Bld: 244 mg/dL — ABNORMAL HIGH (ref 70–99)
Potassium: 4.1 mmol/L (ref 3.5–5.1)
Sodium: 141 mmol/L (ref 135–145)
TOTAL PROTEIN: 7.3 g/dL (ref 6.5–8.1)

## 2018-05-06 LAB — BLOOD GAS, ARTERIAL
Acid-Base Excess: 14.2 mmol/L — ABNORMAL HIGH (ref 0.0–2.0)
BICARBONATE: 40.1 mmol/L — AB (ref 20.0–28.0)
Drawn by: 105521
O2 Content: 4 L/min
O2 Saturation: 96.8 %
Patient temperature: 97.7
pCO2 arterial: 69 mmHg (ref 32.0–48.0)
pH, Arterial: 7.38 (ref 7.350–7.450)
pO2, Arterial: 85.7 mmHg (ref 83.0–108.0)

## 2018-05-06 LAB — LIPID PANEL
CHOLESTEROL: 171 mg/dL (ref 0–200)
HDL: 27 mg/dL — ABNORMAL LOW (ref >40)
LDL CALC: 113 mg/dL — AB (ref 0–99)
Total CHOL/HDL Ratio: 6.3 RATIO
Triglycerides: 156 mg/dL — ABNORMAL HIGH (ref <150)
VLDL: 31 mg/dL (ref 0–40)

## 2018-05-06 LAB — GLUCOSE, CAPILLARY
GLUCOSE-CAPILLARY: 229 mg/dL — AB (ref 70–99)
Glucose-Capillary: 262 mg/dL — ABNORMAL HIGH (ref 70–99)
Glucose-Capillary: 261 mg/dL — ABNORMAL HIGH (ref 70–99)
Glucose-Capillary: 223 mg/dL — ABNORMAL HIGH (ref 70–99)

## 2018-05-06 MED ORDER — INSULIN GLARGINE 100 UNIT/ML ~~LOC~~ SOLN: 35.0000 [IU] | Freq: Every day | SUBCUTANEOUS | Status: DC

## 2018-05-06 MED ORDER — LIDOCAINE 5 % EX PTCH
1.0000 | MEDICATED_PATCH | CUTANEOUS | Status: DC
  Administered 2018-05-08 – 2018-05-10 (×3): 1 via TRANSDERMAL
  Filled 2018-05-06 (×5): qty 1

## 2018-05-06 MED ORDER — SODIUM CHLORIDE 0.9% FLUSH: 3.0000 mL | INTRAVENOUS | Status: DC | PRN

## 2018-05-06 MED ORDER — DICLOFENAC SODIUM 1 % TD GEL
2.0000 g | Freq: Four times a day (QID) | TRANSDERMAL | Status: DC
  Administered 2018-05-06 – 2018-05-10 (×7): 2 g via TOPICAL
  Filled 2018-05-06: qty 100

## 2018-05-06 MED ORDER — LIDOCAINE 5 % EX PTCH
1.0000 | MEDICATED_PATCH | Freq: Once | CUTANEOUS | Status: DC
  Filled 2018-05-06: qty 1

## 2018-05-06 MED ORDER — SODIUM CHLORIDE 0.9% FLUSH
3.0000 mL | Freq: Two times a day (BID) | INTRAVENOUS | Status: DC
  Administered 2018-05-06 – 2018-05-10 (×7): 3 mL via INTRAVENOUS

## 2018-05-06 MED ORDER — SODIUM CHLORIDE 0.9 % IV SOLN
250.0000 mL | INTRAVENOUS | Status: DC
  Administered 2018-05-08: 09:00:00 via INTRAVENOUS

## 2018-05-06 MED ORDER — ROSUVASTATIN CALCIUM 20 MG PO TABS
20.0000 mg | ORAL_TABLET | Freq: Every day | ORAL | Status: DC
  Administered 2018-05-06 – 2018-05-07 (×2): 20 mg via ORAL
  Filled 2018-05-06 (×2): qty 1

## 2018-05-06 MED ORDER — ACETAMINOPHEN 325 MG PO TABS
650.0000 mg | ORAL_TABLET | Freq: Four times a day (QID) | ORAL | Status: DC
  Administered 2018-05-06 – 2018-05-10 (×15): 650 mg via ORAL
  Filled 2018-05-06 (×16): qty 2

## 2018-05-06 MED ORDER — INSULIN GLARGINE 100 UNIT/ML ~~LOC~~ SOLN
40.0000 [IU] | Freq: Every day | SUBCUTANEOUS | Status: DC
  Administered 2018-05-07 – 2018-05-08 (×2): 40 [IU] via SUBCUTANEOUS
  Filled 2018-05-06 (×2): qty 0.4

## 2018-05-06 NOTE — Plan of Care (Signed)

## 2018-05-06 NOTE — Progress Notes (Signed)
Inpatient Diabetes Program Recommendations  AACE/ADA: New Consensus Statement on Inpatient Glycemic Control (2015)  Target Ranges:  Prepandial:   less than 140 mg/dL      Peak postprandial:   less than 180 mg/dL (1-2 hours)      Critically ill patients:  140 - 180 mg/dL   Results for KARIEM, WOLFSON (MRN [id number]) as of 05/06/2018 10:34  Ref. Range 05/05/2018 08:19 05/05/2018 12:03 05/05/2018 17:20 05/05/2018 21:16 05/06/2018 08:00  Glucose-Capillary Latest Ref Range: 70 - 99 mg/dL 207 (H) 224 (H) 225 (H) 219 (H) 262 (H)   Review of Glycemic Control  Diabetes history: DM 2 Outpatient Diabetes medications: reported not taking glipizide Current orders for Inpatient glycemic control: Lantus 30 units, Novolog 0-20 units tid, Novolog 0-5 units qhs  Inpatient Diabetes Program Recommendations:    Glucose trends still in the 200 range consistently. Consider increasing Lantus to 40 units.  A1c 10.5% on 9/11  Thanks,  Tama Headings RN, MSN, BC-ADM Inpatient Diabetes Coordinator Team Pager [phone] (8a-5p)

## 2018-05-06 NOTE — Clinical Social Work Note (Signed)
Clinical Social Work Assessment  Patient Details  Name: [name]. MRN: [id number] Date of Birth: 16-Jun-1961  Date of referral:  05/03/18               Reason for consult:  Discharge Planning, Care Management Concerns                Permission sought to share information with:  Family Supports, Customer service manager Permission granted to share information::  Yes, Verbal Permission Granted  Name::     [place]::  SNFs  Relationship::  Spouse  Contact Information:  [phone]  Housing/Transportation Living arrangements for the past 2 months:  [place] of Information:  Patient Patient Interpreter Needed:  None Criminal Activity/Legal Involvement Pertinent to Current Situation/Hospitalization:  No - Comment as needed Significant Relationships:    Lives with:  Spouse Do you feel safe going back to the place where you live?  No Need for family participation in patient care:  Yes (Comment)  Care giving concerns:  CSW received referral for possible SNF placement at time of discharge. Spoke with patient and spouse regarding possibility of SNF placement . Patient's spouse is currently unable to care for him at their home given patient's current needs. Patient and spouse expressed understanding of PT recommendation and are agreeable to SNF placement at time of discharge. CSW to continue to follow and assist with discharge planning needs.     Social Worker assessment / plan:  Spoke with patient and spouse concerning possibility of rehab at [facility] before returning home.    Employment status:  Retired Nurse, adult PT Recommendations:  [place] / Referral to community resources:  [place]  Patient/Family's Response to care:  Patient and spouse  recognize need for rehab before returning home and are agreeable to a SNF in [place]. They report preference for [facility]. CSW  explained insurance authorization process. Patient's family reported that they want patient to get stronger to be able to come back home.   Patient/Family's Understanding of and Emotional Response to Diagnosis, Current Treatment, and Prognosis:  Patient/family is realistic regarding therapy needs and expressed being hopeful for SNF placement at [facility]. Patient expressed understanding of CSW role and discharge process as well as medical condition. No questions/concerns about plan or treatment.    Emotional Assessment Appearance:  Appears stated age Attitude/Demeanor/Rapport:  Unable to Assess Affect (typically observed):  Unable to Assess Orientation:  Oriented to Self, Oriented to Place, Oriented to Situation Alcohol / Substance use:  Not Applicable Psych involvement (Current and /or in the community):  No (Comment)  Discharge Needs  Concerns to be addressed:  Discharge Planning Concerns, Care Coordination Readmission within the last 30 days:  No Current discharge risk:  Dependent with Mobility Barriers to Discharge:  Continued Medical Work up   [employer], [place] 05/06/2018, 12:07 PM

## 2018-05-06 NOTE — Progress Notes (Addendum)
Subjective:   Mr. Hassell was examined at bedside and continues to endorse shoulder pain and states he never got his hydrocodone or his lidocaine patch (MAR shows patient refused). Complaining of 'deep muscle pain' on right side of his face. States he needs his hydrocodone to control his facial pain. Currently endorsing soreness of his face. Explained to the patient about importance of avoiding opioids due to its respiratory complications. Was offered Tylenol but states hydrocodone is the only med that works for him. States breathing is good. Able to sleep more supine. States he needs some ice for his left leg. States his leg pain has been ongoing for 3 years but his lower extremity swelling is fairly new.  Objective:  Vital signs in last 24 hours: Vitals:   05/06/18 0011 05/06/18 0015 05/06/18 0406 05/06/18 0439  BP: 115/60 115/60 116/80 (!) 154/86  Pulse:  99 92   Resp:  (!) 30 (!) 27   Temp: 97.9 F (36.6 C)   99.4 F (37.4 C)  TempSrc: Oral   Axillary  SpO2:  93% 96%   Weight:    (!) 181 kg  Height:       Const: In NAD, lying comfortably in bed watching TV HEENT: Nasal canula in place, right sided facial droop-chronic  CV: Irregular, normal S1 and S2. No MGR Resp: CTABL, no wheezes, crackles, rhonchi  Ext: No pitting edema, stasis dermatitis, erythematous at the foot, areas of abrasions at the LLE covered with bandaid   Assessment/Plan:  Principal Problem:   Acute diastolic heart failure (HCC) Active Problems:   Pressure injury of skin   Acute metabolic encephalopathy   RUQ pain   Atrial fibrillation with RVR [facility])  Mr. 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   Acute metabolic encephalopathy 2/2 acute on chronic hypercapnic respiratory failure-improved:His mental status remains stable without confusion. He refuses nighttime BiPAP due to feeling uncomfortable but is saturating well at 90-91% on 2L nasal canula.  -Nasal canula as tolerated  -BiPAP prn  -Can obtain VBG if worsening mental status   Acute on chronic combined systolic and diastolic heart failure exacerbation:He seems to be diuresing pretty well. I do not have an accurate representation of his weight but is currently at 181kg <<194kg yesterday. UOP of net negative 1.8L.  -Cardiology reccs:  *Continue IV Lasix 80mg  BID  *Arrange TEE 05/07/18  Atrial Fibrillation:His HR remains irregular which is thought to have been a trigger to his acute CHF exacerbation. Usually HR with range 100-130s despite been on coreg 12.5mg  BID. He remains asymptomatic at this time -Cardiology reccs:  *Planned TEE-guided DC cardioversion 05/07/18  *Continue Coreg 12.5mg  BID and Eliquis   *Amiodarone gtt for rhythm control   CAD s/p CABG: LDL elevated at 113.  -Start Crestor 20mg    AoCKD:Slightly increased Cr today. Cr. 2.30<<2.15. Maryjane Hurter known Baseline was 1.40 in 2018]  Type 2 Diabetes Mellitus:CBG in the 260s this AM.  -Increase Lantus to 35U qhs + SSI  Chronic back pain: He reports of left sided chronic mid thoracic pain which is usually relieved with his home hydrocodone. On Physical exam, he had TTP at the site. I will avoid opioids at this time  due to ongoing respiratory symptoms. -Tylenol 650mg  q6 + Lidocaine patches + Voltaren gel    OZD:GUYQI Healthy,replace electrolytes as needed DVT HKV:QQVZDGL  Code status:FULL code  Dispo: Anticipated discharge to SNF per physical and occupational therapy.  Jean Rosenthal, MD 05/06/2018 Pager: [phone] IMTS PGY-1

## 2018-05-06 NOTE — Progress Notes (Signed)
Update: Consent attempted for TEE/DCCV; however, pt states that a provider has not discussed the procedure with him. The consent has been placed in the front of the chart, but is not signed by the patient or witnessed until a provider discusses procedure.

## 2018-05-06 NOTE — Progress Notes (Signed)
  Date: 05/06/2018  Patient name: [name].  Medical record number: [id number]  Date of birth: 1961-03-20   I have seen and evaluated this patient and I have discussed the plan of care with the house staff. Please see their note for complete details. I concur with their findings with the following additions/corrections: [name] was seen on morning team rounds.  He complains of left thoracic spine pain and was requesting his hydrocodone.  He states he has had left foot pain for 3 years and the pain he is having currently is unchanged.  He remains tachycardic usually in the low 100s but transiently up to 130.  His O2 sats is appropriate at 90 to 91% while on 2 L of oxygen nasal cannula.  His weights and his I/O are inaccurate and cannot be used for decision-making.  His lungs remain clear today.  He remains in a regular rhythm.  His lower extremities are unchanged but overall improved.  His creatinine has had a slow trend up and his bicarb is overall stable.  For his acute on chronic diastolic heart failure exacerbation he remains on Lasix 80 IV twice daily.  It is hard to assess volume status in this gentleman and I appreciate heart failure's assistance.  They plan to cardiovert to help with his volume status and assess his cardiac function with a TEE at the same time.  For his A. fib with RVR, heart failure has started amiodarone for rate control and assist with rhythm control with his cardioversion.  He is also on Coreg and that dose is been titrated up.  He remains on his apixaban.  He has chronic kidney disease stage III and we are watching this closely as we diurese him.  He does not have a metabolic acidosis but rather alkalosis likely secondary to his respiratory status.  Chronic pain -I agree with Dr. Eileen Stanford not to resume his home hydrocodone but instead we will schedule acetaminophen, has Lidoderm patches available, have a topical nonsteroidal available, and offer heating pads  or ice packs.  Disposition will likely be to a skilled nursing facility as recommended by PT.  Bartholomew Crews, MD 05/06/2018, 11:45 AM

## 2018-05-06 NOTE — NC FL2 (Signed)
[place] LEVEL OF CARE SCREENING TOOL     IDENTIFICATION  Patient Name: [name]. Birthdate: February 21, 1961 Sex: male Admission Date (Current Location): 04/30/2018  [facility] and [place] Number:  Herbalist and Address:  [facility], [place] [address], [place], [place] [address]      Provider Number: [id number]  Attending Physician Name and Address:  Bartholomew Crews, MD  Relative Name and Phone Number:  YOSHUA GEISINGER [phone]    Current Level of Care: Hospital Recommended Level of Care: [place] Prior Approval Number:    Date Approved/Denied:   PASRR Number: [id number]  Discharge Plan: SNF    Current Diagnoses: Patient Active Problem List   Diagnosis Date Noted  . Acute diastolic heart failure ([place])   . RUQ pain   . Atrial fibrillation with RVR ([place])   . Acute metabolic encephalopathy 69/48/5462  . Hypercarbia   . Hyperglycemia   . AKI (acute kidney injury) ([place])   . Altered mental status   . Pressure injury of skin 01/29/2017  . Acute CHF ([place]) 01/28/2017  . SIRS (systemic inflammatory response syndrome) ([place]) 01/28/2017  . UTI (urinary tract infection) 01/28/2017  . Acute encephalopathy 01/28/2017  . Hereditary and idiopathic peripheral neuropathy 01/08/2017  . Acute on chronic respiratory failure ([place]) 02/16/2016  . Irritation of right eye 10/18/2015  . 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 Headache due to intracranial disease 10/13/2013  . Cellulitis and abscess of leg 06/16/2013  . Edema 12/17/2012  . Postoperative CSF leak 11/29/2012  . Gout flare 11/12/2012  . Acute renal insufficiency 11/12/2012  . Purulent bronchitis ([place]) 10/28/2012  . Serratia infection 10/24/2012  . 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 Leucocytosis 10/24/2012  . Impaired fasting glucose 10/24/2012  . Tracheostomy status ([place]) 10/17/2012  . 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 Status post craniotomy  10/12/2012  . Respiratory failure, post-operative ([place]) 10/12/2012  . CAD (coronary artery disease) of artery bypass graft 09/09/2012  . 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 Preop cardiovascular exam 09/09/2012  . Morbid obesity ([place]) 09/09/2012  . Tobacco abuse 09/09/2012    Orientation RESPIRATION BLADDER Height & Weight     Situation, Place, Self, Time  O2(Nasal Cannula 4L/m, BiPAP) Incontinent, External catheter Weight: (!) 399 lb (181 kg) Height:  5\' 10"  (177.8 cm)  BEHAVIORAL SYMPTOMS/MOOD NEUROLOGICAL BOWEL NUTRITION STATUS      Incontinent Diet(see discharge summary)  AMBULATORY STATUS COMMUNICATION OF NEEDS Skin   Extensive Assist Verbally (MASD right and left groin, right and left heel cracks)                       Personal Care Assistance Level of Assistance  Bathing, Feeding, Dressing, Total care Bathing Assistance: Maximum assistance Feeding assistance: Independent Dressing Assistance: Maximum assistance Total Care Assistance: Maximum assistance   Functional Limitations Info  Sight, Speech, Hearing Sight Info: Impaired Hearing Info: Adequate Speech Info: Adequate    SPECIAL CARE FACTORS FREQUENCY  PT (By licensed PT), OT (By licensed OT)     PT Frequency: 3x weekly OT Frequency: 3x weekly            Contractures Contractures Info: Not present    Additional Factors Info  Code Status, Allergies(Obese: BMI 57.25) Code Status Info: Full Code Allergies Info: Allergies:  Morphine And Related, Gabapentin, Ivp Dye Iodinated Diagnostic Agents, Other  Current Medications (05/06/2018):  This is the current hospital active medication list Current Facility-Administered Medications  Medication Dose Route Frequency Provider Last Rate Last Dose  . acetaminophen (TYLENOL) tablet 650 mg  650 mg Oral Q6H PRN Welford Roche, MD   650 mg at 05/05/18 2106   Or  . acetaminophen (TYLENOL) suppository 650 mg  650 mg Rectal Q6H  PRN Santos-Sanchez, Idalys, MD      . albuterol (PROVENTIL) (2.5 MG/3ML) 0.083% nebulizer solution 2.5 mg  2.5 mg Inhalation Q6H PRN Agyei, Obed K, MD      . amiodarone (NEXTERONE PREMIX) 360-4.14 MG/200ML-% (1.8 mg/mL) IV infusion  30 mg/hr Intravenous Continuous [name], MD 16.67 mL/hr at 05/06/18 0904 30 mg/hr at 05/06/18 0904  . apixaban (ELIQUIS) tablet 5 mg  5 mg Oral BID Jean Rosenthal, MD   5 mg at 05/06/18 0906  . artificial tears (LACRILUBE) ophthalmic ointment 1 application  1 application Both Eyes QHS PRN Santos-Sanchez, Idalys, MD      . carvedilol (COREG) tablet 12.5 mg  12.5 mg Oral BID WC Agyei, Obed K, MD   12.5 mg at 05/06/18 0903  . furosemide (LASIX) injection 80 mg  80 mg Intravenous BID Jean Rosenthal, MD   80 mg at 05/06/18 0859  . insulin aspart (novoLOG) injection 0-20 Units  0-20 Units Subcutaneous TID WC Welford Roche, MD   11 Units at 05/06/18 0910  . insulin aspart (novoLOG) injection 0-5 Units  0-5 Units Subcutaneous QHS Welford Roche, MD   2 Units at 05/05/18 2151  . insulin glargine (LANTUS) injection 30 Units  30 Units Subcutaneous Daily Neva Seat, MD   30 Units at 05/06/18 [phone]  . lidocaine (LIDODERM) 5 % 1 patch  1 patch Transdermal Once Seawell, Jaimie A, DO      . polyethylene glycol (MIRALAX / GLYCOLAX) packet 17 g  17 g Oral Daily PRN Santos-Sanchez, Idalys, MD      . rosuvastatin (CRESTOR) tablet 20 mg  20 mg Oral q1800 Katherine Roan, MD         Discharge Medications: Please see discharge summary for a list of discharge medications.  Relevant Imaging Results:  Relevant Lab Results:   Additional Information SSN: [id number]  Alberteen Sam, LCSW

## 2018-05-06 NOTE — Progress Notes (Addendum)
Physical Therapy Treatment Patient Details Name: [name]. MRN: [id number] DOB: November 16, 1960 Today's Date: 05/06/2018    History of Present Illness 57 yo male smoker was found by family confused.  His mother passed away 1 week prior.  In ER he was found to have hypercapnia and started on Bipap.  UDS positive for opiates. PMHx of CAD, HTN, Gout, Fibromyalgia, DM, ischemic CM, Acoustic neuroma, A fib on eliquis.    PT Comments    Pt progressing well with mobility. +2 mod assist required for bed mobility. Pt sat EOB x 10 minutes supervision. +2 min assist sit to stand with RW. Sidestepping toward left with RW +2 min assist. Pt returned to supine at end of session. Pt very appreciative of therapy assist. He said sitting up felt wonderful. Wide recliner placed in pt room in anticipation of him being able to tolerate OOB. Pt scheduled for cardioversion tomorrow, 05-07-18.    Follow Up Recommendations  SNF     Equipment Recommendations  None recommended by PT    Recommendations for Other Services       Precautions / Restrictions Precautions Precautions: Fall;Other (comment) Precaution Comments: watch HR and sats, rectal tube    Mobility  Bed Mobility Overal bed mobility: Needs Assistance Bed Mobility: Supine to Sit;Sit to Supine     Supine to sit: +2 for physical assistance;Mod assist;HOB elevated Sit to supine: +2 for physical assistance;Mod assist   General bed mobility comments: +rail, assist to elevate trunk and scoot to EOB using bed pad  Transfers Overall transfer level: Needs assistance Equipment used: Rolling walker (2 wheeled) Transfers: Sit to/from Stand Sit to Stand: +2 physical assistance;Min assist         General transfer comment: cues for hand placement, assist to power up  Ambulation/Gait Ambulation/Gait assistance: Min assist;+2 safety/equipment Gait Distance (Feet): 3 Feet Assistive device: Rolling walker (2 wheeled) Gait Pattern/deviations:  Step-to pattern Gait velocity: decreased   General Gait Details: sidestepping toward left for better positioning in bed, tachy throughout with max HR 160   Stairs             Wheelchair Mobility    Modified Rankin (Stroke Patients Only)       Balance Overall balance assessment: Needs assistance Sitting-balance support: No upper extremity supported;Feet supported Sitting balance-Leahy Scale: Good Sitting balance - Comments: Pt sat EOB x 10 minutes supervision   Standing balance support: Bilateral upper extremity supported;During functional activity Standing balance-Leahy Scale: Poor Standing balance comment: reliant on BUE                            Cognition Arousal/Alertness: Awake/alert Behavior During Therapy: WFL for tasks assessed/performed Overall Cognitive Status: Within Functional Limits for tasks assessed                                        Exercises      General Comments        Pertinent Vitals/Pain Pain Assessment: Faces Faces Pain Scale: Hurts little more Pain Location: bilat distal LEs Pain Descriptors / Indicators: Grimacing;Sore Pain Intervention(s): Monitored during session;Repositioned;Ice applied    Home Living                      Prior Function            PT Goals (current  goals can now be found in the care plan section) Acute Rehab PT Goals Patient Stated Goal: to walk PT Goal Formulation: With patient Time For Goal Achievement: 05/16/18 Potential to Achieve Goals: Fair Progress towards PT goals: Progressing toward goals    Frequency    Min 2X/week      PT Plan Current plan remains appropriate    Co-evaluation              AM-PAC PT "6 Clicks" Daily Activity  Outcome Measure  Difficulty turning over in bed (including adjusting bedclothes, sheets and blankets)?: A Lot Difficulty moving from lying on back to sitting on the side of the bed? : Unable Difficulty sitting  down on and standing up from a chair with arms (e.g., wheelchair, bedside commode, etc,.)?: A Lot Help needed moving to and from a bed to chair (including a wheelchair)?: A Little Help needed walking in hospital room?: A Lot Help needed climbing 3-5 steps with a railing? : Total 6 Click Score: 11    End of Session Equipment Utilized During Treatment: Oxygen Activity Tolerance: Patient tolerated treatment well Patient left: in bed;with call bell/phone within reach;with nursing/sitter in room Nurse Communication: Mobility status PT Visit Diagnosis: Other abnormalities of gait and mobility (R26.89);Muscle weakness (generalized) (M62.81)     Time: 9038-3338 PT Time Calculation (min) (ACUTE ONLY): 28 min  Charges:  $Therapeutic Activity: 23-37 mins                     Lorrin Goodell, PT  Office # [phone] Pager [phone]    [name] 05/06/2018, 10:26 AM

## 2018-05-06 NOTE — Progress Notes (Addendum)
Advanced Heart Failure Rounding Note  PCP:  Primary Cardiologist:   Subjective:    Stood up yesterday says wasn't short of breath, working with PT this morning to get up into chair.  Still having orthopnea but better, requiring 6L O2 is on 3L at home.     Objective:   Weight Range: (!) 181 kg Body mass index is 57.25 kg/m.   Vital Signs:   Temp:  [97.9 F (36.6 C)-99.4 F (37.4 C)] 99.4 F (37.4 C) (09/16 0439) Pulse Rate:  [77-140] 99 (09/16 0817) Resp:  [17-30] 27 (09/16 0406) BP: (115-154)/(60-86) 129/72 (09/16 0817) SpO2:  [86 %-99 %] 99 % (09/16 0817) FiO2 (%):  [35 %] 35 % (09/16 0406) Weight:  [181 kg] 181 kg (09/16 0439) Last BM Date: 05/05/18  Weight change: Filed Weights   05/01/18 1400 05/05/18 0431 05/06/18 0439  Weight: (!) 174.5 kg (!) 194.1 kg (!) 181 kg    Intake/Output:   Intake/Output Summary (Last 24 hours) at 05/06/2018 1042 Last data filed at 05/06/2018 0904 Gross per 24 hour  Intake 505.3 ml  Output 2775 ml  Net -2269.7 ml      Physical Exam    General:  Obese, sleepy,  No resp difficulty HEENT: normal Neck: Supple. JVP difficult to assess . Carotids 2+ bilat; no bruits. No lymphadenopathy or thyromegaly appreciated. Cor: PMI nondisplaced. Regular rate & rhythm. No rubs, gallops or murmurs. Lungs: Clear anterior breath sounds Abdomen: Soft, nontender, nondistended. No hepatosplenomegaly. No bruits or masses. Good bowel sounds. Extremities: No cyanosis, clubbing, rash, trace edema LE Neuro: Alert & orientedx3, right sided facial droop from stroke 3 years prior   Telemetry   Afib, 80-120's highly variable  EKG    Afib rate 125  Labs    CBC No results for input(s): WBC, NEUTROABS, HGB, HCT, MCV, PLT in the last 72 hours. Basic Metabolic Panel Recent Labs    05/05/18 0349 05/06/18 0356  NA 141 141  K 4.1 4.1  CL 95* 91*  CO2 33* 35*  GLUCOSE 216* 244*  BUN 43* 44*  CREATININE 2.15* 2.30*  CALCIUM 8.8* 9.2  MG 1.9   --    Liver Function Tests Recent Labs    05/05/18 0349 05/06/18 0356  AST 28 22  ALT 33 29  ALKPHOS 199* 201*  BILITOT 1.4* 0.9  PROT 6.6 7.3  ALBUMIN 2.7* 2.7*   No results for input(s): LIPASE, AMYLASE in the last 72 hours. Cardiac Enzymes No results for input(s): CKTOTAL, CKMB, CKMBINDEX, TROPONINI in the last 72 hours.  BNP: BNP (last 3 results) Recent Labs    04/30/18 2044  BNP 557.2*    ProBNP (last 3 results) No results for input(s): PROBNP in the last 8760 hours.   D-Dimer No results for input(s): DDIMER in the last 72 hours. Hemoglobin A1C No results for input(s): HGBA1C in the last 72 hours. Fasting Lipid Panel Recent Labs    05/06/18 0636  CHOL 171  HDL 27*  LDLCALC 113*  TRIG 156*  CHOLHDL 6.3   Thyroid Function Tests No results for input(s): TSH, T4TOTAL, T3FREE, THYROIDAB in the last 72 hours.  Invalid input(s): FREET3  Other results:   Imaging     No results found.   Medications:     Scheduled Medications: . apixaban  5 mg Oral BID  . carvedilol  12.5 mg Oral BID WC  . furosemide  80 mg Intravenous BID  . insulin aspart  0-20 Units Subcutaneous TID  WC  . insulin aspart  0-5 Units Subcutaneous QHS  . insulin glargine  30 Units Subcutaneous Daily  . lidocaine  1 patch Transdermal Once     Infusions: . amiodarone 30 mg/hr (05/06/18 0904)     PRN Medications:  acetaminophen **OR** acetaminophen, albuterol, artificial tears, polyethylene glycol    Patient Profile   57 yo with history of CAD s/p CABG, chronic diastolic CHF, atrial fibrillation, obesity, prior CVA s/p acoustic neuroma surgery, HTN, and diabetes is seen for evaluation of CHF and atrial fibrillation.   Assessment/Plan   1. Acute metabolic encephalopathy: With hypercapneic respiratory failure.  Now resolved, strong suspicion for OHS/OSA.  - Needs outpatient sleep study.  - getting bpap at night 2. Acute on chronic presumed diastolic CHF: He has been  diuresed this admission with improvement.  Last echo from 6/18 with normal EF, echo this admission was uninterpretable.  Unlikely to be a candidate for cardiac MRI based on his size. Still some volume overload on exam.  - Difficult to assess volume status likely still some fluid on board, continue lasix 80BID for today .  - Needs evaluation of LV function => see below, will arrange TEE.  3. Atrial fibrillation: ?Chronic.  Had atrial fibrillation in 6/18, started on Eliquis but did not continue as outpatient.  No followup.  Per wife, HR in 64s at home.  It is possible he was in NSR then went back in atrial fibrillation with RVR, triggering CHF and admission.  However, no way of knowing whether he has been in afib chronically.  HR is not controlled on Coreg (110s-120s).  - Continue Eliquis (restarted at admission).  - Would likely benefit from NSR, will attempt TEE-guided DCCV, probably on Tuesday.  - HR improved with addition of amio but still higher than I would like. Continue amiodarone gtt for rate control and also to try to hold him in NSR after DCCV.  - Continue Coreg  4. ?AKI on CKD stage 3: Last creatinine in 6/18 was 1.4, admitted with creatinine 4.19.  Not sure of his recent baseline.  Creatinine stable with diuresis, 2.30 currently.   - Continue Lasix as above.  5. CAD: S/p LIMA-LAD in 2009.  No chest pain.  Mild troponin elevation with no trend this admission.  Suspect demand ischemia with volume overload/CHF.  - No ASA given Eliquis use.   - LDL 113, start rosuvastatin 20mg    Length of Stay: [place], MD  05/06/2018, 10:42 AM  Advanced Heart Failure Team Pager [phone] (M-F; 7a - 4p)  Please contact [facility] for night-coverage after hours (4p -7a ) and weekends on [email or web address]  Patient seen with resident, agree with the above note.  He diuresed well yesterday.  He remains in atrial fibrillation now on amiodarone gtt.  Still suspect some volume overload.  Creatinine  fairly stable.  - Continue amiodarone gtt - Continue Eliquis.  - Continue Lasix 80 mg IV bid.  - If we can arrange a time, will plan TEE-guided DCCV tomorrow but may be Wednesday if no time.  Discussed risks/benefits of procedure with patient and he agrees to procedure.   [name] 05/06/2018

## 2018-05-07 DIAGNOSIS — M549 Dorsalgia, unspecified: Secondary | ICD-10-CM

## 2018-05-07 DIAGNOSIS — N189 Chronic kidney disease, unspecified: Secondary | ICD-10-CM

## 2018-05-07 LAB — BASIC METABOLIC PANEL
ANION GAP: 13 (ref 5–15)
BUN: 38 mg/dL — AB (ref 6–20)
CALCIUM: 8.7 mg/dL — AB (ref 8.9–10.3)
CO2: 35 mmol/L — ABNORMAL HIGH (ref 22–32)
Chloride: 90 mmol/L — ABNORMAL LOW (ref 98–111)
Creatinine, Ser: 2.07 mg/dL — ABNORMAL HIGH (ref 0.61–1.24)
GFR calc Af Amer: 39 mL/min — ABNORMAL LOW (ref >60)
GFR, EST NON AFRICAN AMERICAN: 34 mL/min — AB (ref >60)
GLUCOSE: 241 mg/dL — AB (ref 70–99)
Potassium: 3.3 mmol/L — ABNORMAL LOW (ref 3.5–5.1)
SODIUM: 138 mmol/L (ref 135–145)

## 2018-05-07 LAB — GLUCOSE, CAPILLARY
GLUCOSE-CAPILLARY: 297 mg/dL — AB (ref 70–99)
GLUCOSE-CAPILLARY: 230 mg/dL — AB (ref 70–99)
GLUCOSE-CAPILLARY: 268 mg/dL — AB (ref 70–99)
GLUCOSE-CAPILLARY: 244 mg/dL — AB (ref 70–99)

## 2018-05-07 MED ORDER — POTASSIUM CHLORIDE CRYS ER 20 MEQ PO TBCR
40.0000 meq | EXTENDED_RELEASE_TABLET | Freq: Two times a day (BID) | ORAL | Status: DC
  Administered 2018-05-07 – 2018-05-10 (×7): 40 meq via ORAL
  Filled 2018-05-07 (×7): qty 2

## 2018-05-07 NOTE — Progress Notes (Addendum)
Advanced Heart Failure Rounding Note  PCP:  Primary Cardiologist: Dr Aundra Dubin  Subjective:    Remains in afib 90-110s on amiodarone drip.   Creatinine improving with diuresis. 2.07 today. I/O -2.7 L with 80 mg IV lasix BID. Weights are not accurate.  Denies CP or SOB.  We discussed TEE/DCCV at length.  Objective:   Weight Range: (!) 181 kg Body mass index is 57.25 kg/m.   Vital Signs:   Temp:  [98 F (36.7 C)-98.6 F (37 C)] 98.3 F (36.8 C) (09/17 0817) Pulse Rate:  [85-104] 85 (09/17 0106) Resp:  [16-30] 25 (09/17 0106) BP: (123-142)/(79-82) 142/79 (09/17 0259) SpO2:  [93 %-95 %] 95 % (09/17 0106) FiO2 (%):  [35 %] 35 % (09/17 0259) Last BM Date: 05/06/18  Weight change: Filed Weights   05/01/18 1400 05/05/18 0431 05/06/18 0439  Weight: (!) 174.5 kg (!) 194.1 kg (!) 181 kg    Intake/Output:   Intake/Output Summary (Last 24 hours) at 05/07/2018 0839 Last data filed at 05/07/2018 0400 Gross per 24 hour  Intake 1591.89 ml  Output 4350 ml  Net -2758.11 ml      Physical Exam    General: Obese. No resp difficulty. HEENT: Normal Neck: Supple. JVP difficult to assess. Carotids 2+ bilat; no bruits. No thyromegaly or nodule noted. Cor: PMI nondisplaced. IRR, No M/G/R noted Lungs: CTAB, normal effort. Abdomen: Soft, non-tender, non-distended, no HSM. No bruits or masses. +BS  Extremities: No cyanosis, clubbing, or rash. R and LLE no edema. Flaky skin BLE Neuro: Alert & orientedx3, cranial nerves grossly intact. moves all 4 extremities w/o difficulty. Affect pleasant. Chronic right facial droop   Telemetry   Afib 90-110s. Personally reviewed.   EKG    No new tracings.   Labs    CBC No results for input(s): WBC, NEUTROABS, HGB, HCT, MCV, PLT in the last 72 hours. Basic Metabolic Panel Recent Labs    05/05/18 0349 05/06/18 0356 05/07/18 0459  NA 141 141 138  K 4.1 4.1 3.3*  CL 95* 91* 90*  CO2 33* 35* 35*  GLUCOSE 216* 244* 241*  BUN 43* 44*  38*  CREATININE 2.15* 2.30* 2.07*  CALCIUM 8.8* 9.2 8.7*  MG 1.9  --   --    Liver Function Tests Recent Labs    05/05/18 0349 05/06/18 0356  AST 28 22  ALT 33 29  ALKPHOS 199* 201*  BILITOT 1.4* 0.9  PROT 6.6 7.3  ALBUMIN 2.7* 2.7*   No results for input(s): LIPASE, AMYLASE in the last 72 hours. Cardiac Enzymes No results for input(s): CKTOTAL, CKMB, CKMBINDEX, TROPONINI in the last 72 hours.  BNP: BNP (last 3 results) Recent Labs    04/30/18 2044  BNP 557.2*    ProBNP (last 3 results) No results for input(s): PROBNP in the last 8760 hours.   D-Dimer No results for input(s): DDIMER in the last 72 hours. Hemoglobin A1C No results for input(s): HGBA1C in the last 72 hours. Fasting Lipid Panel Recent Labs    05/06/18 0636  CHOL 171  HDL 27*  LDLCALC 113*  TRIG 156*  CHOLHDL 6.3   Thyroid Function Tests No results for input(s): TSH, T4TOTAL, T3FREE, THYROIDAB in the last 72 hours.  Invalid input(s): FREET3  Other results:   Imaging    No results found.   Medications:     Scheduled Medications: . acetaminophen  650 mg Oral Q6H  . apixaban  5 mg Oral BID  . carvedilol  12.5  mg Oral BID WC  . diclofenac sodium  2 g Topical QID  . furosemide  80 mg Intravenous BID  . insulin aspart  0-20 Units Subcutaneous TID WC  . insulin aspart  0-5 Units Subcutaneous QHS  . insulin glargine  40 Units Subcutaneous Daily  . lidocaine  1 patch Transdermal Q24H  . potassium chloride  40 mEq Oral BID  . rosuvastatin  20 mg Oral q1800  . sodium chloride flush  3 mL Intravenous Q12H    Infusions: . sodium chloride    . amiodarone 30 mg/hr (05/07/18 0400)    PRN Medications: albuterol, artificial tears, polyethylene glycol, sodium chloride flush    Patient Profile   57 yo with history of CAD s/p CABG, chronic diastolic CHF, atrial fibrillation, obesity, prior CVA s/p acoustic neuroma surgery, HTN, and diabetes is seen for evaluation of CHF and atrial  fibrillation.   Assessment/Plan   1. Acute metabolic encephalopathy: With hypercapneic respiratory failure.  Now resolved, strong suspicion for OHS/OSA.  - Needs outpatient sleep study.  - Continue BiPAP qHS 2. Acute on chronic presumed diastolic CHF: He has been diuresed this admission with improvement.  Last echo from 6/18 with normal EF, echo this admission was uninterpretable.  Unlikely to be a candidate for cardiac MRI based on his size.  - Difficult to assess volume status. Weights inaccurate. Creatinine improving with diuresis. Continue 80 mg IV lasix BID for now.  - Needs evaluation of LV function => Going for TEE today vs tomorrow (see below) 3. Atrial fibrillation: ?Chronic.  Had atrial fibrillation in 6/18, started on Eliquis but did not continue as outpatient.  No followup.  Per wife, HR in 48s at home.  It is possible he was in NSR then went back in atrial fibrillation with RVR, triggering CHF and admission.  However, no way of knowing whether he has been in afib chronically.  HR is not controlled on Coreg (110s-120s).  - Continue Eliquis (restarted at admission).  - Would likely benefit from NSR, will attempt TEE-guided DCCV. Today or tomorrow. There may be a spot this morning (spoke with Kathlee Nations in Endo and she will call and let me know). Will keep him NPO for now.  - HR improved with addition of amio but still higher than I would like. Continue amiodarone gtt for rate control and also to try to hold him in NSR after DCCV.  - Continue Coreg  4. ?AKI on CKD stage 3: Last creatinine in 6/18 was 1.4, admitted with creatinine 4.19.  Not sure of his recent baseline.  Creatinine improving with diuresis. 2.07 today.  - Continue Lasix as above.  5. CAD: S/p LIMA-LAD in 2009.  No CP.  Mild troponin elevation with no trend this admission.  Suspect demand ischemia with volume overload/CHF.  - No ASA given Eliquis use.   - LDL 113. Continue rosuvastatin 20mg  6. Hypokalemia - K 3.3. Supp ordered  already. 7. HTN - SBP 120-140s. Will not made adjustments today with pending DCCV.  Remains in afib. Will need TEE/DCCV. There may be a spot for him this morning - Kathlee Nations with endo to call me back and let me know. Otherwise will need to schedule him for tomorrow. Discussed the procedure at length with him and he is agreeable.   Length of Stay: [place], NP  05/07/2018, 8:39 AM  Advanced Heart Failure Team Pager [phone] (M-F; 7a - 4p)  Please contact [facility] for night-coverage after hours (4p -  7a ) and weekends on [email or web address]  Patient seen with NP, agree with the above note.  He continues to diurese well, creatinine trending down.  Remains in atrial fibrillation.   - Continue IV Lasix today.  - Needs TEE-guided DCCV.  Cannot get space in endo today, plan for tomorrow am.   [name] 05/07/2018

## 2018-05-07 NOTE — Progress Notes (Signed)
[facility] has started insurance authorization and will order a bariatric bed.  Percell Locus Veola Cafaro LCSW [phone]

## 2018-05-07 NOTE — Progress Notes (Signed)
   Subjective:   Overnight: Required BiPAP  [name] was examined and evaluated at bedside this AM. Observed laying comfortably in bed speaking with heart failure NP. He asked when his TEE will be. Informed the patient that the schedule is still pending and we will update him when finalized.   Objective:  Vital signs in last 24 hours: Vitals:   05/07/18 0106 05/07/18 0259 05/07/18 0817 05/07/18 0908  BP: (!) 142/79 (!) 142/79  (!) 163/95  Pulse: 85   99  Resp: (!) 25   19  Temp: 98.6 F (37 C)  98.3 F (36.8 C)   TempSrc: Oral  Oral   SpO2: 95%   93%  Weight:      Height:       Const: In NAD, lying in bed comfortably  CV: Irregular, tachycardic to 100s-110s. Normal S1 and S2. No murmurs, gallops, rubs  Resp: CTABL, no wheezes, crackles, rhonchi. 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   Acute on chronic combined systolic and diastolic heart failure exacerbation:Continued to have adequate diuresis with net output of negative 2.7L. No measured weight today but objectively, he is improving with no signs of pulmonary congestion or lower extremity edema. -TEE with DC Cardioversion (tentative today) -Continue IV Lasix 80mg  BID     Atrial Fibrillation: Remains irregular. HR with range  100s-110s.  -Continue Coreg 12.5mg  BID -TEE with DC cardioversion   CAD s/p CABG: Continue Crestor 20mg   Acute metabolic encephalopathy 2/2 acute on chronic hypercapnic respiratory failure-improved:More alert and back to baseline. Received BiPAP overnight though not because of worsening respiratory function but given his OSA/OHS, it'll benefit his overal respiratory function. This morning O2 saturation of 96% on room air. -Nasal canula as tolerated  -BiPAP prn   AoCKD: Improving. Cr. 2.07<<2.30 [Last known Baseline was 1.40 in 2018]  Type 2 Diabetes Mellitus:Monitor CBGs -On Lantus 40U qhs + SSI  Chronic back pain: Continue Tylenol 650mg  q6 + Lidocaine patches + Voltaren gel    QMG:QQPYP Healthy,replace electrolytes as needed DVT PJK:DTOIZTI  Code status:FULL code  Dispo: Anticipated discharge to SNF [facility]) pending cardiology recommendation.   Jean Rosenthal, MD 05/07/2018, 12:57 PM Pager: [phone] IMTS PGY-1

## 2018-05-07 NOTE — H&P (View-Only) (Signed)
Advanced Heart Failure Rounding Note  PCP:  Primary Cardiologist: Dr Aundra Dubin  Subjective:    Remains in afib 90-110s on amiodarone drip.   Creatinine improving with diuresis. 2.07 today. I/O -2.7 L with 80 mg IV lasix BID. Weights are not accurate.  Denies CP or SOB.  We discussed TEE/DCCV at length.  Objective:   Weight Range: (!) 181 kg Body mass index is 57.25 kg/m.   Vital Signs:   Temp:  [98 F (36.7 C)-98.6 F (37 C)] 98.3 F (36.8 C) (09/17 0817) Pulse Rate:  [85-104] 85 (09/17 0106) Resp:  [16-30] 25 (09/17 0106) BP: (123-142)/(79-82) 142/79 (09/17 0259) SpO2:  [93 %-95 %] 95 % (09/17 0106) FiO2 (%):  [35 %] 35 % (09/17 0259) Last BM Date: 05/06/18  Weight change: Filed Weights   05/01/18 1400 05/05/18 0431 05/06/18 0439  Weight: (!) 174.5 kg (!) 194.1 kg (!) 181 kg    Intake/Output:   Intake/Output Summary (Last 24 hours) at 05/07/2018 0839 Last data filed at 05/07/2018 0400 Gross per 24 hour  Intake 1591.89 ml  Output 4350 ml  Net -2758.11 ml      Physical Exam    General: Obese. No resp difficulty. HEENT: Normal Neck: Supple. JVP difficult to assess. Carotids 2+ bilat; no bruits. No thyromegaly or nodule noted. Cor: PMI nondisplaced. IRR, No M/G/R noted Lungs: CTAB, normal effort. Abdomen: Soft, non-tender, non-distended, no HSM. No bruits or masses. +BS  Extremities: No cyanosis, clubbing, or rash. R and LLE no edema. Flaky skin BLE Neuro: Alert & orientedx3, cranial nerves grossly intact. moves all 4 extremities w/o difficulty. Affect pleasant. Chronic right facial droop   Telemetry   Afib 90-110s. Personally reviewed.   EKG    No new tracings.   Labs    CBC No results for input(s): WBC, NEUTROABS, HGB, HCT, MCV, PLT in the last 72 hours. Basic Metabolic Panel Recent Labs    05/05/18 0349 05/06/18 0356 05/07/18 0459  NA 141 141 138  K 4.1 4.1 3.3*  CL 95* 91* 90*  CO2 33* 35* 35*  GLUCOSE 216* 244* 241*  BUN 43* 44*  38*  CREATININE 2.15* 2.30* 2.07*  CALCIUM 8.8* 9.2 8.7*  MG 1.9  --   --    Liver Function Tests Recent Labs    05/05/18 0349 05/06/18 0356  AST 28 22  ALT 33 29  ALKPHOS 199* 201*  BILITOT 1.4* 0.9  PROT 6.6 7.3  ALBUMIN 2.7* 2.7*   No results for input(s): LIPASE, AMYLASE in the last 72 hours. Cardiac Enzymes No results for input(s): CKTOTAL, CKMB, CKMBINDEX, TROPONINI in the last 72 hours.  BNP: BNP (last 3 results) Recent Labs    04/30/18 2044  BNP 557.2*    ProBNP (last 3 results) No results for input(s): PROBNP in the last 8760 hours.   D-Dimer No results for input(s): DDIMER in the last 72 hours. Hemoglobin A1C No results for input(s): HGBA1C in the last 72 hours. Fasting Lipid Panel Recent Labs    05/06/18 0636  CHOL 171  HDL 27*  LDLCALC 113*  TRIG 156*  CHOLHDL 6.3   Thyroid Function Tests No results for input(s): TSH, T4TOTAL, T3FREE, THYROIDAB in the last 72 hours.  Invalid input(s): FREET3  Other results:   Imaging    No results found.   Medications:     Scheduled Medications: . acetaminophen  650 mg Oral Q6H  . apixaban  5 mg Oral BID  . carvedilol  12.5  mg Oral BID WC  . diclofenac sodium  2 g Topical QID  . furosemide  80 mg Intravenous BID  . insulin aspart  0-20 Units Subcutaneous TID WC  . insulin aspart  0-5 Units Subcutaneous QHS  . insulin glargine  40 Units Subcutaneous Daily  . lidocaine  1 patch Transdermal Q24H  . potassium chloride  40 mEq Oral BID  . rosuvastatin  20 mg Oral q1800  . sodium chloride flush  3 mL Intravenous Q12H    Infusions: . sodium chloride    . amiodarone 30 mg/hr (05/07/18 0400)    PRN Medications: albuterol, artificial tears, polyethylene glycol, sodium chloride flush    Patient Profile   57 yo with history of CAD s/p CABG, chronic diastolic CHF, atrial fibrillation, obesity, prior CVA s/p acoustic neuroma surgery, HTN, and diabetes is seen for evaluation of CHF and atrial  fibrillation.   Assessment/Plan   1. Acute metabolic encephalopathy: With hypercapneic respiratory failure.  Now resolved, strong suspicion for OHS/OSA.  - Needs outpatient sleep study.  - Continue BiPAP qHS 2. Acute on chronic presumed diastolic CHF: He has been diuresed this admission with improvement.  Last echo from 6/18 with normal EF, echo this admission was uninterpretable.  Unlikely to be a candidate for cardiac MRI based on his size.  - Difficult to assess volume status. Weights inaccurate. Creatinine improving with diuresis. Continue 80 mg IV lasix BID for now.  - Needs evaluation of LV function => Going for TEE today vs tomorrow (see below) 3. Atrial fibrillation: ?Chronic.  Had atrial fibrillation in 6/18, started on Eliquis but did not continue as outpatient.  No followup.  Per wife, HR in 75s at home.  It is possible he was in NSR then went back in atrial fibrillation with RVR, triggering CHF and admission.  However, no way of knowing whether he has been in afib chronically.  HR is not controlled on Coreg (110s-120s).  - Continue Eliquis (restarted at admission).  - Would likely benefit from NSR, will attempt TEE-guided DCCV. Today or tomorrow. There may be a spot this morning (spoke with Kathlee Nations in Endo and she will call and let me know). Will keep him NPO for now.  - HR improved with addition of amio but still higher than I would like. Continue amiodarone gtt for rate control and also to try to hold him in NSR after DCCV.  - Continue Coreg  4. ?AKI on CKD stage 3: Last creatinine in 6/18 was 1.4, admitted with creatinine 4.19.  Not sure of his recent baseline.  Creatinine improving with diuresis. 2.07 today.  - Continue Lasix as above.  5. CAD: S/p LIMA-LAD in 2009.  No CP.  Mild troponin elevation with no trend this admission.  Suspect demand ischemia with volume overload/CHF.  - No ASA given Eliquis use.   - LDL 113. Continue rosuvastatin 20mg  6. Hypokalemia - K 3.3. Supp ordered  already. 7. HTN - SBP 120-140s. Will not made adjustments today with pending DCCV.  Remains in afib. Will need TEE/DCCV. There may be a spot for him this morning - Kathlee Nations with endo to call me back and let me know. Otherwise will need to schedule him for tomorrow. Discussed the procedure at length with him and he is agreeable.   Length of Stay: [place], NP  05/07/2018, 8:39 AM  Advanced Heart Failure Team Pager [phone] (M-F; 7a - 4p)  Please contact [facility] for night-coverage after hours (4p -  7a ) and weekends on [email or web address]  Patient seen with NP, agree with the above note.  He continues to diurese well, creatinine trending down.  Remains in atrial fibrillation.   - Continue IV Lasix today.  - Needs TEE-guided DCCV.  Cannot get space in endo today, plan for tomorrow am.   [name] 05/07/2018

## 2018-05-07 NOTE — Progress Notes (Signed)
Inpatient Diabetes Program Recommendations  AACE/ADA: New Consensus Statement on Inpatient Glycemic Control (2015)  Target Ranges:  Prepandial:   less than 140 mg/dL      Peak postprandial:   less than 180 mg/dL (1-2 hours)      Critically ill patients:  140 - 180 mg/dL    Review of Glycemic Control  Diabetes history: DM 2 Outpatient Diabetes medications: reported not taking glipizide Current orders for Inpatient glycemic control: Lantus 30 units, Novolog 0-20 units tid, Novolog 0-5 units qhs  Inpatient Diabetes Program Recommendations:    Lantus 40 units given this am. Will follow trends today.  Thanks,  Tama Headings RN, MSN, BC-ADM Inpatient Diabetes Coordinator Team Pager [phone] (8a-5p)

## 2018-05-07 NOTE — Anesthesia Preprocedure Evaluation (Addendum)
Anesthesia Evaluation  Patient identified by MRN, date of birth, ID band Patient awake    Reviewed: Allergy & Precautions, NPO status , Patient's Chart, lab work & pertinent test results  Airway Mallampati: II  TM Distance: >3 FB Neck ROM: Full    Dental no notable dental hx. (+) Missing, Poor Dentition   Pulmonary Current Smoker,    Pulmonary exam normal breath sounds clear to auscultation       Cardiovascular hypertension, + CAD, + Past MI, + CABG and +CHF  Normal cardiovascular exam+ dysrhythmias Atrial Fibrillation  Rhythm:Regular Rate:Normal  Study Conclusions  - Left ventricle: EF appears normal but very poor image quality and   patient refused definity. The cavity size was normal. Wall   thickness was increased in a pattern of moderate LVH. Systolic   function was normal. The estimated ejection fraction was in the   range of 55% to 60%.    Neuro/Psych CVA, Residual Symptoms negative neurological ROS     GI/Hepatic negative GI ROS, Neg liver ROS,   Endo/Other  diabetesMorbid obesity  Renal/GU Renal disease     Musculoskeletal negative musculoskeletal ROS (+)   Abdominal   Peds  Hematology negative hematology ROS (+)   Anesthesia Other Findings Day of surgery medications reviewed with the patient.  Reproductive/Obstetrics                           Anesthesia Physical Anesthesia Plan  ASA: III  Anesthesia Plan: General   Post-op Pain Management:    Induction: Intravenous  PONV Risk Score and Plan: 1 and Propofol infusion  Airway Management Planned: Mask  Additional Equipment:   Intra-op Plan:   Post-operative Plan:   Informed Consent:   Plan Discussed with:   Anesthesia Plan Comments:         Anesthesia Quick Evaluation

## 2018-05-07 NOTE — Progress Notes (Signed)
  Date: 05/07/2018  Patient name: [name].  Medical record number: [id number]  Date of birth: [birth year]   I have seen and evaluated this patient and I have discussed the plan of care with the house staff. Please see their note for complete details. I concur with their findings with the following additions/corrections: [name] was seen on team AM rounds and discussed with cards PA. PO intake recorded as 1.2 L which seems accurate and net negative 2.6 L on lasix 80 IV BID. Cr down trending. For TEE / cardioversion in AM.   Bartholomew Crews, MD 05/07/2018, 1:17 PM

## 2018-05-08 ENCOUNTER — Inpatient Hospital Stay (HOSPITAL_COMMUNITY): Payer: Medicare PPO | Admitting: Anesthesiology

## 2018-05-08 ENCOUNTER — Encounter (HOSPITAL_COMMUNITY): Payer: Self-pay | Admitting: *Deleted

## 2018-05-08 ENCOUNTER — Inpatient Hospital Stay (HOSPITAL_COMMUNITY): Payer: Medicare PPO

## 2018-05-08 ENCOUNTER — Encounter (HOSPITAL_COMMUNITY)
Admission: EM | Disposition: A | Discharge status: Skilled Nursing Facility | Payer: Self-pay | Source: Home / Self Care | Attending: Internal Medicine

## 2018-05-08 DIAGNOSIS — I517 Cardiomegaly: Secondary | ICD-10-CM

## 2018-05-08 DIAGNOSIS — I4891 Unspecified atrial fibrillation: Secondary | ICD-10-CM

## 2018-05-08 DIAGNOSIS — Z598 Other problems related to housing and economic circumstances: Secondary | ICD-10-CM

## 2018-05-08 HISTORY — PX: CARDIOVERSION: SHX1299

## 2018-05-08 HISTORY — PX: TEE WITHOUT CARDIOVERSION: SHX5443

## 2018-05-08 LAB — BASIC METABOLIC PANEL
Anion gap: 15 (ref 5–15)
BUN: 39 mg/dL — ABNORMAL HIGH (ref 6–20)
CO2: 35 mmol/L — ABNORMAL HIGH (ref 22–32)
Calcium: 8.9 mg/dL (ref 8.9–10.3)
Chloride: 87 mmol/L — ABNORMAL LOW (ref 98–111)
Creatinine, Ser: 2.21 mg/dL — ABNORMAL HIGH (ref 0.61–1.24)
GFR calc non Af Amer: 31 mL/min — ABNORMAL LOW (ref >60)
GFR, EST AFRICAN AMERICAN: 36 mL/min — AB (ref >60)
Glucose, Bld: 201 mg/dL — ABNORMAL HIGH (ref 70–99)
POTASSIUM: 3.4 mmol/L — AB (ref 3.5–5.1)
SODIUM: 137 mmol/L (ref 135–145)

## 2018-05-08 LAB — GLUCOSE, CAPILLARY
GLUCOSE-CAPILLARY: 268 mg/dL — AB (ref 70–99)
GLUCOSE-CAPILLARY: 309 mg/dL — AB (ref 70–99)
Glucose-Capillary: 242 mg/dL — ABNORMAL HIGH (ref 70–99)
Glucose-Capillary: 230 mg/dL — ABNORMAL HIGH (ref 70–99)

## 2018-05-08 LAB — HEPATITIS C ANTIBODY: HCV AB: 0.1 {s_co_ratio} (ref 0.0–0.9)

## 2018-05-08 LAB — HEPATITIS B SURFACE ANTIBODY,QUALITATIVE: Hep B S Ab: NONREACTIVE

## 2018-05-08 SURGERY — ECHOCARDIOGRAM, TRANSESOPHAGEAL
Anesthesia: General

## 2018-05-08 MED ORDER — PROPOFOL 500 MG/50ML IV EMUL
INTRAVENOUS | Status: DC | PRN
  Administered 2018-05-08: 150 ug/kg/min via INTRAVENOUS

## 2018-05-08 MED ORDER — BUTAMBEN-TETRACAINE-BENZOCAINE 2-2-14 % EX AERO
INHALATION_SPRAY | CUTANEOUS | Status: DC | PRN
  Administered 2018-05-08: 2 via TOPICAL

## 2018-05-08 MED ORDER — SIMVASTATIN 40 MG PO TABS: 40.0000 mg | ORAL_TABLET | Freq: Every day | ORAL | Status: DC

## 2018-05-08 MED ORDER — TORSEMIDE 20 MG PO TABS
40.0000 mg | ORAL_TABLET | Freq: Every day | ORAL | Status: DC
  Administered 2018-05-08: 40 mg via ORAL
  Filled 2018-05-08 (×2): qty 2

## 2018-05-08 MED ORDER — ATORVASTATIN CALCIUM 40 MG PO TABS
40.0000 mg | ORAL_TABLET | Freq: Every day | ORAL | Status: DC
  Administered 2018-05-08 – 2018-05-09 (×2): 40 mg via ORAL
  Filled 2018-05-08 (×2): qty 1

## 2018-05-08 MED ORDER — INSULIN GLARGINE 100 UNIT/ML ~~LOC~~ SOLN
48.0000 [IU] | Freq: Every day | SUBCUTANEOUS | Status: DC
  Administered 2018-05-09 – 2018-05-10 (×2): 48 [IU] via SUBCUTANEOUS
  Filled 2018-05-08 (×2): qty 0.48

## 2018-05-08 MED ORDER — SODIUM CHLORIDE 0.9 % IV SOLN
INTRAVENOUS | Status: AC | PRN
  Administered 2018-05-08: 500 mL via INTRAVENOUS

## 2018-05-08 NOTE — Progress Notes (Signed)
Pt not wanting to go on BIPAP tonight. States it keeps him awake all night and he only gets about an hour of sleep. Pt currently wearing 3L [place] and vitals are WNL. RT will check back later.

## 2018-05-08 NOTE — Progress Notes (Signed)
  Date: 05/08/2018  Patient name: [name].  Medical record number: [id number]  Date of birth: [birth year]   I have seen and evaluated this patient and I have discussed the plan of care with the house staff. Please see their note for complete details. I concur with their findings with the following additions/corrections: [name] was seen this AM with Dr [name].  He is status post cardioversion and was in sinus rhythm at our examination.  He complained of dyspnea / not being able to get his breath but could not state if it was better or worse than admission.  He is net -726 cc yesterday but I think that that is an over estimation as he had only a half a liter of oral intake recorded & the prior day he had an oral intake of 1.2 L.  His creatinine has been stable over the past 5 days despite diuresis.  Cardiology completed his TEE today with a normal EF and mildly decreased systolic function.  They plan to continue amiodarone IV until tomorrow and then switch to oral.  He is being transitioned from IV Lasix to torosemide.  The patient did not want to undergo PT assessment because it was a waste of his money and he could exercise at home.  He was also unhappy with our discussions about SNF because he was going home.  His most recent PT evaluation showed that he was only able to walk 3 feet and that was with 2 person assist.  The social workers note also indicated that the wife cannot care for him at their home due to his medical needs and agreed with SNF placement.  We tried to explain to [name] that at this point, with his debility, he would not be safe to go home.  We will continue with PT and SNF placement.   Frank Crews, MD 05/08/2018, 3:12 PM

## 2018-05-08 NOTE — Progress Notes (Signed)
Patient ID: Frank Pat., male   DOB: [birth year], 57 y.o.   MRN: [id number] Electrical Cardioversion Procedure Note [name] [id number] June 07, 1961  Procedure: Electrical Cardioversion Indications:  Atrial Fibrillation  Procedure Details Consent: Risks of procedure as well as the alternatives and risks of each were explained to the (patient/caregiver).  Consent for procedure obtained. Time Out: Verified patient identification, verified procedure, site/side was marked, verified correct patient position, special equipment/implants available, medications/allergies/relevent history reviewed, required imaging and test results available.  Performed  Patient placed on cardiac monitor, pulse oximetry, supplemental oxygen as necessary.  Sedation given: Propofol per anesthesiology Pacer pads placed anterior and posterior chest.  Cardioverted 1 time(s).  Cardioverted at [place].  Evaluation Findings: Post procedure EKG shows: NSR Complications: None Patient did tolerate procedure well.   [name] 05/08/2018, 9:51 AM

## 2018-05-08 NOTE — Interval H&P Note (Signed)
History and Physical Interval Note:  05/08/2018 9:34 AM  [name].  has presented today for surgery, with the diagnosis of a fib  The various methods of treatment have been discussed with the patient and family. After consideration of risks, benefits and other options for treatment, the patient has consented to  Procedure(s): TRANSESOPHAGEAL ECHOCARDIOGRAM (TEE) (N/A) CARDIOVERSION (N/A) as a surgical intervention .  The patient's history has been reviewed, patient examined, no change in status, stable for surgery.  I have reviewed the patient's chart and labs.  Questions were answered to the patient's satisfaction.     Lilyth Lawyer [employer]

## 2018-05-08 NOTE — Transfer of Care (Signed)
Immediate Anesthesia Transfer of Care Note  Patient: [name].  Procedure(s) Performed: TRANSESOPHAGEAL ECHOCARDIOGRAM (TEE) (N/A ) CARDIOVERSION (N/A )  Patient Location: Endoscopy Unit  Anesthesia Type:General  Level of Consciousness: awake, alert  and oriented  Airway & Oxygen Therapy: Patient Spontanous Breathing and Patient connected to nasal cannula oxygen  Post-op Assessment: Report given to RN, Post -op Vital signs reviewed and stable and Patient moving all extremities X 4  Post vital signs: Reviewed and stable  Last Vitals:  Vitals Value Taken Time  BP 126/75 05/08/2018  9:55 AM  Temp    Pulse 74 05/08/2018 10:04 AM  Resp 20 05/08/2018 10:04 AM  SpO2 93 % 05/08/2018 10:04 AM  Vitals shown include unvalidated device data.  Last Pain:  Vitals:   05/08/18 0955  TempSrc:   PainSc: 0-No pain      Patients Stated Pain Goal: 4 (01/75/10 2585)  Complications: No apparent anesthesia complications

## 2018-05-08 NOTE — Procedures (Signed)
Electrical Cardioversion Procedure Note [name] [id number] 01/31/1961  Procedure: Electrical Cardioversion Indications:  Atrial Fibrillation  Procedure Details Consent: Risks of procedure as well as the alternatives and risks of each were explained to the (patient/caregiver).  Consent for procedure obtained. Time Out: Verified patient identification, verified procedure, site/side was marked, verified correct patient position, special equipment/implants available, medications/allergies/relevent history reviewed, required imaging and test results available.  Performed  Patient placed on cardiac monitor, pulse oximetry, supplemental oxygen as necessary.  Sedation given: Propofol per anesthesiology Pacer pads placed anterior and posterior chest.  Cardioverted 1 time(s).  Cardioverted at [place].  Evaluation Findings: Post procedure EKG shows: NSR Complications: None Patient did tolerate procedure well.   [name] 05/08/2018, 9:48 AM

## 2018-05-08 NOTE — Anesthesia Procedure Notes (Signed)
Procedure Name: MAC Date/Time: 05/08/2018 9:28 AM Performed by: Kyung Rudd, CRNA Pre-anesthesia Checklist: Patient identified, Emergency Drugs available, Suction available, Patient being monitored and Timeout performed Patient Re-evaluated:Patient Re-evaluated prior to induction Oxygen Delivery Method: Nasal cannula Induction Type: IV induction Placement Confirmation: positive ETCO2

## 2018-05-08 NOTE — Anesthesia Postprocedure Evaluation (Signed)
Anesthesia Post Note  Patient: Zeshan Sena.  Procedure(s) Performed: TRANSESOPHAGEAL ECHOCARDIOGRAM (TEE) (N/A ) CARDIOVERSION (N/A )     Patient location during evaluation: Endoscopy Anesthesia Type: MAC Level of consciousness: awake and alert Pain management: pain level controlled Vital Signs Assessment: post-procedure vital signs reviewed and stable Respiratory status: spontaneous breathing, nonlabored ventilation and respiratory function stable Cardiovascular status: stable and blood pressure returned to baseline Postop Assessment: no apparent nausea or vomiting Anesthetic complications: no    Last Vitals:  Vitals:   05/08/18 0841 05/08/18 0955  BP: (!) 160/63 126/75  Pulse:  84  Resp: (!) 36   Temp: 36.6 C   SpO2: 93% 92%    Last Pain:  Vitals:   05/08/18 0955  TempSrc:   PainSc: 0-No pain                 Lynda Rainwater

## 2018-05-08 NOTE — Progress Notes (Signed)
Inpatient Diabetes Program Recommendations  AACE/ADA: New Consensus Statement on Inpatient Glycemic Control (2015)  Target Ranges:  Prepandial:   less than 140 mg/dL      Peak postprandial:   less than 180 mg/dL (1-2 hours)      Critically ill patients:  140 - 180 mg/dL   Results for [name], [name] (MRN [id number]) as of 05/08/2018 10:29  Ref. Range 05/07/2018 09:04 05/07/2018 12:19 05/07/2018 17:05 05/07/2018 22:12 05/08/2018 08:04  Glucose-Capillary Latest Ref Range: 70 - 99 mg/dL 297 (H) 230 (H) 268 (H) 244 (H) 242 (H)   Review of Glycemic Control  Diabetes history: DM 2 Outpatient Diabetes medications: reported not taking glipizide Current orders for Inpatient glycemic control: Lantus 40 units, Novolog 0-20 units tid, Novolog 0-5 units qhs  Inpatient Diabetes Program Recommendations:    Glucose trends elevated in the 200's still on Lantus 40 units, Consider increasing Lantus to 48 units.  Thanks,  Tama Headings RN, MSN, BC-ADM Inpatient Diabetes Coordinator Team Pager [phone] (8a-5p)

## 2018-05-08 NOTE — CV Procedure (Signed)
Procedure: TEE  Sedation: Per anesthesiology  Indication: Atrial fibrillation  Findings:  Please see echo section for full report.  Normal left ventricular size with mild LV hypertrophy.  EF 55-60% with normal wall motion.  D-shaped interventricular septum suggestive of RV pressure/volume overload.  Mildly dilated RV with mildly decreased systolic function.  Trivial TR, could not obtain complete doppler jet so no PA pressure estimation available.  Mild right atrial enlargement.  Mild left atrial enlargement with no LA appendage thrombus.  No PFO or ASD by color doppler.  Trivial MR.  Trileaflet, mildly calcified aortic valve with no stenosis or regurgitation. Normal caliber aorta with mild plaque.   Impression: No LA thrombus, may proceed with DCCV.   [name] 05/08/2018 9:48 AM

## 2018-05-08 NOTE — Progress Notes (Signed)
Advanced Heart Failure Rounding Note  PCP:  Primary Cardiologist: Dr Aundra Dubin  Subjective:    TEE-DCCV done today, he is now back in NSR in 80s.   Creatinine up mildly to 2.2 today.  He diuresed well again yesterday.   TEE: EF 55-60%, mildly dilated RV with mildly decreased systolic function, D-shaped interventricular septum.  No LAA thrombus.   Objective:   Weight Range: (!) 181 kg Body mass index is 57.25 kg/m.   Vital Signs:   Temp:  [97.8 F (36.6 C)-98.2 F (36.8 C)] 97.8 F (36.6 C) (09/18 0841) Pulse Rate:  [62-101] 77 (09/18 0809) Resp:  [18-36] 36 (09/18 0841) BP: (115-163)/(63-87) 160/63 (09/18 0841) SpO2:  [93 %-95 %] 93 % (09/18 0841) FiO2 (%):  [35 %] 35 % (09/18 0314) Weight:  [181 kg] 181 kg (09/18 0841) Last BM Date: 05/07/18  Weight change: Filed Weights   05/05/18 0431 05/06/18 0439 05/08/18 0841  Weight: (!) 194.1 kg (!) 181 kg (!) 181 kg    Intake/Output:   Intake/Output Summary (Last 24 hours) at 05/08/2018 0954 Last data filed at 05/08/2018 0400 Gross per 24 hour  Intake 563.95 ml  Output 1550 ml  Net -986.05 ml      Physical Exam    General: NAD Neck: Difficult, does not appear elevated, no thyromegaly or thyroid nodule.  Lungs: Mild crackles at bases.  CV: Nondisplaced PMI.  Heart regular S1/S2, no S3/S4, no murmur.  Trace ankle edema.   Abdomen: Soft, nontender, no hepatosplenomegaly, no distention.  Skin: Intact without lesions or rashes.  Neurologic: Alert and oriented x 3.  Psych: Normal affect. Extremities: No clubbing or cyanosis.  HEENT: Normal.    Telemetry   Afib 90-110s => NSR 70s. Personally reviewed.   EKG    No new tracings.   Labs    CBC No results for input(s): WBC, NEUTROABS, HGB, HCT, MCV, PLT in the last 72 hours. Basic Metabolic Panel Recent Labs    05/07/18 0459 05/08/18 0335  NA 138 137  K 3.3* 3.4*  CL 90* 87*  CO2 35* 35*  GLUCOSE 241* 201*  BUN 38* 39*  CREATININE 2.07* 2.21*    CALCIUM 8.7* 8.9   Liver Function Tests Recent Labs    05/06/18 0356  AST 22  ALT 29  ALKPHOS 201*  BILITOT 0.9  PROT 7.3  ALBUMIN 2.7*   No results for input(s): LIPASE, AMYLASE in the last 72 hours. Cardiac Enzymes No results for input(s): CKTOTAL, CKMB, CKMBINDEX, TROPONINI in the last 72 hours.  BNP: BNP (last 3 results) Recent Labs    04/30/18 2044  BNP 557.2*    ProBNP (last 3 results) No results for input(s): PROBNP in the last 8760 hours.   D-Dimer No results for input(s): DDIMER in the last 72 hours. Hemoglobin A1C No results for input(s): HGBA1C in the last 72 hours. Fasting Lipid Panel Recent Labs    05/06/18 0636  CHOL 171  HDL 27*  LDLCALC 113*  TRIG 156*  CHOLHDL 6.3   Thyroid Function Tests No results for input(s): TSH, T4TOTAL, T3FREE, THYROIDAB in the last 72 hours.  Invalid input(s): FREET3  Other results:   Imaging    No results found.   Medications:     Scheduled Medications: . [MAR Hold] acetaminophen  650 mg Oral Q6H  . [MAR Hold] apixaban  5 mg Oral BID  . [MAR Hold] carvedilol  12.5 mg Oral BID WC  . [MAR Hold] diclofenac sodium  2 g Topical QID  . [MAR Hold] insulin aspart  0-20 Units Subcutaneous TID WC  . [MAR Hold] insulin aspart  0-5 Units Subcutaneous QHS  . [MAR Hold] insulin glargine  40 Units Subcutaneous Daily  . [MAR Hold] lidocaine  1 patch Transdermal Q24H  . [MAR Hold] potassium chloride  40 mEq Oral BID  . [MAR Hold] rosuvastatin  20 mg Oral q1800  . [MAR Hold] sodium chloride flush  3 mL Intravenous Q12H  . torsemide  40 mg Oral Daily    Infusions: . sodium chloride    . amiodarone 30 mg/hr (05/08/18 0417)    PRN Medications: [MAR Hold] albuterol, [MAR Hold] artificial tears, [MAR Hold] polyethylene glycol, [MAR Hold] sodium chloride flush    Patient Profile   57 yo with history of CAD s/p CABG, chronic diastolic CHF, atrial fibrillation, obesity, prior CVA s/p acoustic neuroma surgery,  HTN, and diabetes is seen for evaluation of CHF and atrial fibrillation.   Assessment/Plan   1. Acute metabolic encephalopathy: With hypercapneic respiratory failure.  Now resolved, strong suspicion for OHS/OSA.  - Needs outpatient sleep study.  - Continue BiPAP qHS 2. Acute on chronic presumed diastolic CHF: He has been diuresed this admission with improvement.  TEE 9/18 showed EF 55-60% with mildly dilated/mildly dysfunctional RV. I think his volume looks better.  - Stop IV Lasix, can convert to torsemide 40 mg bid (has been taking Lasix 80 mg po bid at home).   3. Atrial fibrillation: ?Chronic.  Had atrial fibrillation in 6/18, started on Eliquis but did not continue as outpatient.  No followup.  Per wife, HR in 61s at home.  It is possible he was in NSR then went back in atrial fibrillation with RVR, triggering CHF and admission.  However, no way of knowing whether he has been in afib chronically.  He is now in NSR s/p TEE-guided DCCV.  - Continue Eliquis (restarted at admission).  - Continue IV amiodarone today, switch to po tomorrow.  Will likely need antiarrhythmic to keep him in NSR. 4. ?AKI on CKD stage 3: Last creatinine in 6/18 was 1.4, admitted with creatinine 4.19.  Not sure of his recent baseline. Creatinine 2.2 today.  - Transition to po torsemide, will give 1 dose today, then start 40 mg po bid tomorrow.   5. CAD: S/p LIMA-LAD in 2009.  No CP.  Mild troponin elevation with no trend this admission.  Suspect demand ischemia with volume overload/CHF.  - No ASA given Eliquis use.   - LDL 113. Continue rosuvastatin 20 mg 6. Hypokalemia: Replacing K.  7. HTN: Continue meds.   [name] 05/08/2018

## 2018-05-08 NOTE — Progress Notes (Signed)
Subjective:   [name] was examined at bedside this morning after he had completed his TEE-DCCV at the echo lab.  He states that he tolerated the procedure pretty well.  When asked about shortness of breath, he said that he was breathing about 50% better.  He denied palpitation, headaches, lightheadedness, vision changes.  His only complaint was abdominal discomfort due to constipation with his last bowel movement yesterday afternoon.  He had a lot of questions this morning.  He stated that the cholesterol medication we had started(Crestor) gave him acid reflux and was hesitant taking it.  It was explained to him that though this is unlikely, we will try switching him to statin.  Also, he was hesitant to start p.o. torsemide that cardiology had recommended because Lasix had been working for him at home and does not want him change to another diuretic.  With further education he agreed to start medication.  He stated of not wanting to go to a rehab facility due to financial burden as he is on a fixed income and is also currently bearing the financial burden from his mom's recent funeral last week.  He does want to regain his strength and was finally agreeable to be evaluated by physical therapy.   Objective:  Vital signs in last 24 hours: Vitals:   05/08/18 0809 05/08/18 0841 05/08/18 0955 05/08/18 1200  BP: (!) 163/87 (!) 160/63 126/75 (!) 141/72  Pulse: 77  84 66  Resp:  (!) 36  (!) 22  Temp: 97.8 F (36.6 C) 97.8 F (36.6 C)  97.8 F (36.6 C)  TempSrc: Oral Oral  Oral  SpO2: 93% 93% 92% 95%  Weight:  (!) 181 kg    Height:  5\' 10"  (1.778 m)     Const: In NAD, lying in bed, mood is a little agitated and worried  CV: RRR, no MGR Resp: CTABL, no wheezes, crackles, rhonchi  Abd: BS+, NTTP  Ext: No pitting edema, chronic stasis dermatitis, tender to touch   Assessment/Plan:  Principal Problem:   Acute diastolic heart failure (HCC) Active Problems:   Pressure injury of skin  Acute metabolic encephalopathy   RUQ pain   Atrial fibrillation with RVR ([place])  [name] is a 57 year old obese gentleman with medical history significant for hypercapnic respiratory failure, 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   Acute on chronic combined systolic and diastolic heart failure exacerbation:He underwent TEE with DCCV which showed EF  55%-60%, RV pressure/volume overload, Mildly dilated RV with mildly decreased systolic function, Mild right atrial enlargement. His weight is stable at 181kg with total net output negative 726cc.  -Discontinue IV Lasix and start torsemide 40mg  BID -Strict I/Os. -Daily weights  -Telemetry   Atrial Fibrillation s/p DCCV 05/08/18: Currently in SR. -Continue Coreg 12.5mg  BID -Continue IV amiodarone with plan to switch to po on 05/09/18  CAD s/p CABG:Switch Crestor 20mg  to Lipitor 40mg  qhs  Acute metabolic encephalopathy 2/2 acute on chronic hypercapnic respiratory failure-improved:His recent TEE showed increased right ventricular pressure and decreased RV systolic function indicating sequale from chronic pulmonary hypertension. He continues to be alert. sPO2 was 91-93% on 2.5L [place] which is lower than his home home requirement of 3L. -[place] as tolerated  -BiPAP prn  -Will require outpatient  sleep study.   AoCKD: Cr. 2.21<<2.07<<2.30  [Last known Baseline was 1.40 in 2018]  Type 2 Diabetes Mellitus:Monitor CBGs -On Lantus 48U qhs + SSI   HLK:TGYBW Healthy,replace electrolytes as needed DVT LSL:[id number]  Code status:FULL code  Dispo: Anticipated discharge to SNF [facility]) pending cardiology  recommendation.    Jean Rosenthal, MD 05/08/2018, 2:07 PM Pager: [phone] IMTS PGY-1

## 2018-05-08 NOTE — Progress Notes (Signed)
  Echocardiogram Echocardiogram Transesophageal has been performed.  [name] 05/08/2018, 10:07 AM

## 2018-05-09 ENCOUNTER — Encounter (HOSPITAL_COMMUNITY): Payer: Self-pay | Admitting: Cardiology

## 2018-05-09 LAB — BASIC METABOLIC PANEL
Anion gap: 11 (ref 5–15)
BUN: 37 mg/dL — ABNORMAL HIGH (ref 6–20)
CHLORIDE: 91 mmol/L — AB (ref 98–111)
CO2: 37 mmol/L — AB (ref 22–32)
Calcium: 8.8 mg/dL — ABNORMAL LOW (ref 8.9–10.3)
Creatinine, Ser: 2.2 mg/dL — ABNORMAL HIGH (ref 0.61–1.24)
GFR calc Af Amer: 36 mL/min — ABNORMAL LOW (ref >60)
GFR calc non Af Amer: 31 mL/min — ABNORMAL LOW (ref >60)
GLUCOSE: 198 mg/dL — AB (ref 70–99)
Potassium: 3.5 mmol/L (ref 3.5–5.1)
Sodium: 139 mmol/L (ref 135–145)

## 2018-05-09 LAB — GLUCOSE, CAPILLARY
GLUCOSE-CAPILLARY: 236 mg/dL — AB (ref 70–99)
GLUCOSE-CAPILLARY: 238 mg/dL — AB (ref 70–99)
GLUCOSE-CAPILLARY: 238 mg/dL — AB (ref 70–99)
Glucose-Capillary: 229 mg/dL — ABNORMAL HIGH (ref 70–99)

## 2018-05-09 MED ORDER — TORSEMIDE 20 MG PO TABS
40.0000 mg | ORAL_TABLET | Freq: Two times a day (BID) | ORAL | Status: DC
  Administered 2018-05-09 – 2018-05-10 (×3): 40 mg via ORAL
  Filled 2018-05-09 (×2): qty 2

## 2018-05-09 MED ORDER — AMIODARONE HCL 200 MG PO TABS
400.0000 mg | ORAL_TABLET | Freq: Two times a day (BID) | ORAL | Status: DC
  Administered 2018-05-09 – 2018-05-10 (×3): 400 mg via ORAL
  Filled 2018-05-09 (×3): qty 2

## 2018-05-09 NOTE — Progress Notes (Signed)
Pt declined use of BIPAP- Machine sitting at bedside for patient use if he would like to wear it.  Will cont to monitor progress.

## 2018-05-09 NOTE — Progress Notes (Addendum)
Physical Therapy Treatment Patient Details Name: [name]. MRN: [id number] DOB: 1961-07-01 Today's Date: 05/09/2018    History of Present Illness 57 yo male smoker was found by family confused.  His mother passed away 1 week prior.  In ER he was found to have hypercapnia and started on Bipap.  UDS positive for opiates. PMHx of CAD, HTN, Gout, Fibromyalgia, DM, ischemic CM, Acoustic neuroma, A fib on eliquis.    PT Comments    Patient seen for mobility progression and tolerate increased gait distance. Pt requires min/mod A for all mobility this session and +2 for safety. Continue to progress as tolerated with anticipated d/c to SNF for further skilled PT services.     Follow Up Recommendations  SNF     Equipment Recommendations  None recommended by PT    Recommendations for Other Services       Precautions / Restrictions Precautions Precautions: Fall;Other (comment) Precaution Comments: watch HR and sats, rectal tube Restrictions Weight Bearing Restrictions: No    Mobility  Bed Mobility Overal bed mobility: Needs Assistance Bed Mobility: Supine to Sit     Supine to sit: Min assist     General bed mobility comments: assist to elevate trunk into sitting and use of rails   Transfers Overall transfer level: Needs assistance Equipment used: Rolling walker (2 wheeled) Transfers: Sit to/from Stand Sit to Stand: Min assist;+2 safety/equipment         General transfer comment: assist to steady adn power up into standing; cues for safe hand placement   Ambulation/Gait Ambulation/Gait assistance: Min assist;+2 safety/equipment Gait Distance (Feet): 50 Feet Assistive device: Rolling walker (2 wheeled) Gait Pattern/deviations: Step-to pattern;Decreased step length - right;Decreased step length - left;Decreased dorsiflexion - right;Decreased dorsiflexion - left;Wide base of support;Trunk flexed Gait velocity: decreased   General Gait Details: multimodal cues for  posture and vc for breathing technique and safe use of AD and increased bilat step lengths   Stairs             Wheelchair Mobility    Modified Rankin (Stroke Patients Only)       Balance Overall balance assessment: Needs assistance Sitting-balance support: No upper extremity supported;Feet supported Sitting balance-Leahy Scale: Good     Standing balance support: Bilateral upper extremity supported;During functional activity Standing balance-Leahy Scale: Poor                              Cognition Arousal/Alertness: Awake/alert Behavior During Therapy: WFL for tasks assessed/performed Overall Cognitive Status: Within Functional Limits for tasks assessed                                        Exercises      General Comments        Pertinent Vitals/Pain Pain Assessment: Faces Faces Pain Scale: Hurts little more Pain Location: bilat distal LEs Pain Descriptors / Indicators: Sore Pain Intervention(s): Limited activity within patient's tolerance;Repositioned    Home Living                      Prior Function            PT Goals (current goals can now be found in the care plan section) Progress towards PT goals: Progressing toward goals    Frequency    Min 2X/week  PT Plan Current plan remains appropriate    Co-evaluation              AM-PAC PT "6 Clicks" Daily Activity  Outcome Measure  Difficulty turning over in bed (including adjusting bedclothes, sheets and blankets)?: Unable Difficulty moving from lying on back to sitting on the side of the bed? : Unable Difficulty sitting down on and standing up from a chair with arms (e.g., wheelchair, bedside commode, etc,.)?: Unable Help needed moving to and from a bed to chair (including a wheelchair)?: A Lot Help needed walking in hospital room?: A Lot Help needed climbing 3-5 steps with a railing? : Total 6 Click Score: 8    End of Session Equipment  Utilized During Treatment: Oxygen;Gait belt Activity Tolerance: Patient tolerated treatment well Patient left: in chair;with call bell/phone within reach Nurse Communication: Mobility status PT Visit Diagnosis: Other abnormalities of gait and mobility (R26.89);Muscle weakness (generalized) (M62.81)     Time: 1003-4961 PT Time Calculation (min) (ACUTE ONLY): 35 min  Charges:  $Gait Training: 8-22 mins $Therapeutic Activity: 8-22 mins                     Earney Navy, PTA Acute Rehabilitation Services Pager: [phone] Office: [phone]     Darliss Cheney 05/09/2018, 3:33 PM

## 2018-05-09 NOTE — Progress Notes (Addendum)
  Date: 05/09/2018  Patient name: [name].  Medical record number: [id number]  Date of birth: July 08, 1961   I have seen and evaluated this patient and I have discussed the plan of care with the house staff. Please see their note for complete details. I concur with their findings with the following additions/corrections: [name] was seen on morning team rounds.  [name] was sitting in bed eating breakfast.  [name] did not use BiPAP last night.  [name] thinks it makes his sleeping worse and prefers using nasal cannula.  This is concerning because we feel [name] has a high pretest probability for significant OSA.  For his diastolic heart failure, [name] is on toremide 40 twice daily.  For his A. fib status post cardioversion, [name] remains on oral amiodarone, apixaban and carvedilol and is heart rate is generally less than 80 and [name] remains in sinus today.  For his known CAD status post CABG, [name] is on the beta-blocker, high intensity statin, but no aspirin.  [name] would benefit from a antiplatelet agent although this increases his risk for GI bleeding as [name] also requires apixaban.  [name] is close to being discharged.  [name] is unhappy with SNF placement but PT and OT are recommending SNF placement and his wife states she cannot care for him at home.  [name] had a repeat OT evaluation this morning which showed [name] is more independent moving in bed.  PT will reevaluate him today.  However, I still anticipate [name] will need SNF at the time of discharge.  Bartholomew Crews, MD 05/09/2018, 1:20 PM

## 2018-05-09 NOTE — Progress Notes (Signed)
   Subjective:   Overnight: Refused BiPAP  Today, Mr Dimond was examined at bedside and was found eating breakfast. He slept well by propping his bed up. Currently he denies shortness of breath or lower extremity edema. We had ongoing discussion about disposition and he was agreeable to go to rehab.   Objective:  Vital signs in last 24 hours: Vitals:   05/09/18 0027 05/09/18 0553 05/09/18 0554 05/09/18 0813  BP: (!) 146/70  134/61 124/60  Pulse: 66 60  68  Resp:  20    Temp:   98.8 F (37.1 C) 98.1 F (36.7 C)  TempSrc:    Oral  SpO2: 94% 94%  97%  Weight:      Height:       Const: In NAD, lying in bed, eating breakfast CV: RRR, in MGR Resp: CTABL, no wheezes, crackles, rhonchi  Abd: BS+, NTTP Ext: No pitting edema   Assessment/Plan:  Principal Problem:   Acute diastolic heart failure (HCC) Active Problems:   Pressure injury of skin   Acute metabolic encephalopathy   RUQ pain   Atrial fibrillation with RVR ([place])  Mr. 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   Acute on chronic combined systolic and diastolic heart failure exacerbation: No signs of volume overload clinically. There is inacurate measurement of input,output and weight however he has shown signs of clinical improvement since admission.  -Appreciate HF reccs:  *Continue Torsemide 40mg  BID -Strict I/Os. -Daily weights  -Telemetry   Atrial Fibrillation s/p DCCV 05/08/18: In SR. -Continue Coreg and po amiodarone   CAD s/p CABG:Continue Lipitor 40mg  qhs  Acute  metabolic encephalopathy 2/2 acute on chronic hypercapnic respiratory failure-improved: Tolerating Vance at 2.5L and has been refusing BiPAP. Mental status has improved -BiPAP prn -Continue Millard  AoCKD:Stable.   Type 2 Diabetes Mellitus:On Lantus 48U qhs + SSI  Dispo: Anticipated discharge to SNF ([facility]) pending cardiology recommendation and insurance authorization.   Jean Rosenthal, MD 05/09/2018, 10:35 AM Pager: [phone] IMTS PGY-1

## 2018-05-09 NOTE — Progress Notes (Addendum)
Advanced Heart Failure Rounding Note  PCP:  Primary Cardiologist: Dr Aundra Dubin  Subjective:    Remains in NSR s/p TEE/DCCV 05/08/18  Creatinine 2.2 -> 2.2.  Weight unchanged. Would like to go home. Does not want SNF.   TEE: EF 55-60%, mildly dilated RV with mildly decreased systolic function, D-shaped interventricular septum.  No LAA thrombus.   Objective:   Weight Range: (!) 181 kg Body mass index is 57.25 kg/m.   Vital Signs:   Temp:  [97.8 F (36.6 C)-98.8 F (37.1 C)] 98.1 F (36.7 C) (09/19 0813) Pulse Rate:  [60-84] 68 (09/19 0813) Resp:  [13-22] 20 (09/19 0553) BP: (124-158)/(60-89) 124/60 (09/19 0813) SpO2:  [91 %-97 %] 97 % (09/19 0813) Last BM Date: 05/07/18  Weight change: Filed Weights   05/05/18 0431 05/06/18 0439 05/08/18 0841  Weight: (!) 194.1 kg (!) 181 kg (!) 181 kg    Intake/Output:   Intake/Output Summary (Last 24 hours) at 05/09/2018 0852 Last data filed at 05/08/2018 1745 Gross per 24 hour  Intake 270.55 ml  Output 1400 ml  Net -1129.45 ml      Physical Exam    General: NAD  HEENT: Normal Neck: Supple. JVP 5-6. Carotids 2+ bilat; no bruits. No thyromegaly or nodule noted. Cor: PMI nondisplaced. RRR, No M/G/R noted Lungs: CTAB, normal effort. Abdomen: Obese, soft, non-tender, non-distended, no HSM. No bruits or masses. +BS  Extremities: No cyanosis, clubbing, or rash. R and LLE no edema.  Neuro: Alert & orientedx3, cranial nerves grossly intact. moves all 4 extremities w/o difficulty. Affect pleasant   Telemetry   NSR 70-80s, personally reviewed.   EKG    No new tracings.    Labs    CBC No results for input(s): WBC, NEUTROABS, HGB, HCT, MCV, PLT in the last 72 hours. Basic Metabolic Panel Recent Labs    05/08/18 0335 05/09/18 0422  NA 137 139  K 3.4* 3.5  CL 87* 91*  CO2 35* 37*  GLUCOSE 201* 198*  BUN 39* 37*  CREATININE 2.21* 2.20*  CALCIUM 8.9 8.8*   Liver Function Tests No results for input(s): AST, ALT,  ALKPHOS, BILITOT, PROT, ALBUMIN in the last 72 hours. No results for input(s): LIPASE, AMYLASE in the last 72 hours. Cardiac Enzymes No results for input(s): CKTOTAL, CKMB, CKMBINDEX, TROPONINI in the last 72 hours.  BNP: BNP (last 3 results) Recent Labs    04/30/18 2044  BNP 557.2*    ProBNP (last 3 results) No results for input(s): PROBNP in the last 8760 hours.   D-Dimer No results for input(s): DDIMER in the last 72 hours. Hemoglobin A1C No results for input(s): HGBA1C in the last 72 hours. Fasting Lipid Panel No results for input(s): CHOL, HDL, LDLCALC, TRIG, CHOLHDL, LDLDIRECT in the last 72 hours. Thyroid Function Tests No results for input(s): TSH, T4TOTAL, T3FREE, THYROIDAB in the last 72 hours.  Invalid input(s): FREET3  Other results:   Imaging    No results found.   Medications:     Scheduled Medications: . acetaminophen  650 mg Oral Q6H  . apixaban  5 mg Oral BID  . atorvastatin  40 mg Oral q1800  . carvedilol  12.5 mg Oral BID WC  . diclofenac sodium  2 g Topical QID  . insulin aspart  0-20 Units Subcutaneous TID WC  . insulin aspart  0-5 Units Subcutaneous QHS  . insulin glargine  48 Units Subcutaneous Daily  . lidocaine  1 patch Transdermal Q24H  . potassium  chloride  40 mEq Oral BID  . sodium chloride flush  3 mL Intravenous Q12H  . torsemide  40 mg Oral Daily    Infusions: . sodium chloride    . amiodarone 30 mg/hr (05/08/18 1243)    PRN Medications: albuterol, artificial tears, polyethylene glycol, sodium chloride flush    Patient Profile   57 yo with history of CAD s/p CABG, chronic diastolic CHF, atrial fibrillation, obesity, prior CVA s/p acoustic neuroma surgery, HTN, and diabetes is seen for evaluation of CHF and atrial fibrillation.   Assessment/Plan   1. Acute metabolic encephalopathy: - With hypercapneic respiratory failure.  Now resolved, strong suspicion for OHS/OSA.  - Needs outpatient sleep study.  - Continue  BiPAP qHS - No change to current plan.   2. Acute on chronic presumed diastolic CHF: He has been diuresed this admission with improvement.  TEE 9/18 showed EF 55-60% with mildly dilated/mildly dysfunctional RV.  - Volume status improved.  - Continue torsemide 40 mg BID. 3. Atrial fibrillation: ?Chronic.  Had atrial fibrillation in 6/18, started on Eliquis but did not continue as outpatient.  No followup.  Per wife, HR in 79s at home.  It is possible he was in NSR then went back in atrial fibrillation with RVR, triggering CHF and admission.  However, no way of knowing whether he has been in afib chronically.  - Remains in NSR s/p TEE-guided DCCV.  - Continue Eliquis (restarted at admission).  - Switch to po amiodarone 400 mg BID x 1 week.  Will likely need antiarrhythmic to keep him in NSR. 4. ?AKI on CKD stage 3: Last creatinine in 6/18 was 1.4, admitted with creatinine 4.19.  Not sure of his recent baseline. - Creatinine remains somewhat elevated at 2.2. Unclear baseline.  5. CAD: S/p LIMA-LAD in 2009.  - No s/s of ischemia. Mild troponin elevation with no trend this admission.  Suspect demand ischemia with volume overload/CHF.  - No ASA given Eliquis use.   - LDL 113. Continue rosuvastatin 20 mg 6. Hypokalemia:  - K 3.5 today. Supp.  7. HTN: - Continue current regimen.   Remains in NSR. Switch amio to po.   Shirley Friar, PA-C  05/09/2018   Advanced Heart Failure Team Pager [phone] (M-F; 7a - 4p)  Please contact [facility] for night-coverage after hours (4p -7a ) and weekends on [email or web address]  Patient seen with PA, agree with the above note.  He remains in NSR after DCCV yesterday. Renal function stable.  Volume status looks ok. Agree with transition amiodarone to po. I think he will need this long-term.   He wants to go home, not to rehab facility.  Needs to get out of bed.   [name] 05/09/2018 1:28 PM

## 2018-05-09 NOTE — Progress Notes (Signed)
CCMD notified RN pt was having Wide QRS runs Pt asleep resting comfortabley at this time Dr. Eileen Stanford notified will continue to monitor

## 2018-05-09 NOTE — Progress Notes (Signed)
Occupational Therapy Treatment Patient Details Name: [name]. MRN: [id number] DOB: [birth year] Today's Date: 05/09/2018    History of present illness 57 yo male smoker was found by family confused.  His mother passed away 1 week prior.  In ER he was found to have hypercapnia and started on Bipap.  UDS positive for opiates. PMHx of CAD, HTN, Gout, Fibromyalgia, DM, ischemic CM, Acoustic neuroma, A fib on eliquis.   OT comments  Pt making slow progress toward set goals. Pt much more independent with bed mobility during adls while using rails. Pt could roll side to side with bed rails and pull self up in bed with headboard.  Pt is very resistant to doing tasks for himself. Spoke to him about the need to do more for himself so he does not continue to get weaker. Pt did do more when encouraged. SNF remains appropriate as pt will not be able to care for self at home and still requires assist of 2.   Follow Up Recommendations  SNF    Equipment Recommendations  Other (comment)(tbd)    Recommendations for Other Services      Precautions / Restrictions Precautions Precautions: Fall;Other (comment) Precaution Comments: watch HR and sats, rectal tube Restrictions Weight Bearing Restrictions: No       Mobility Bed Mobility Overal bed mobility: Needs Assistance Bed Mobility: Rolling Rolling: Min guard(with bedrails.)         General bed mobility comments: +rail. Pt moved self to top of bed pulling on headboard.  Transfers                 General transfer comment: Pt remained in bed waiting for rectal tube to be replaced.      Balance Overall balance assessment: Needs assistance                                         ADL either performed or assessed with clinical judgement   ADL Overall ADL's : Needs assistance/impaired Eating/Feeding: Set up;Sitting;Bed level Eating/Feeding Details (indicate cue type and reason): pt can feed himself and drink  from cup with lid without assist but needs to be encouraged to do so.  Grooming: Bed level;Sitting;Wash/dry face;Wash/dry hands;Oral care;Set up Grooming Details (indicate cue type and reason): much encouragement needed. Upper Body Bathing: Moderate assistance;Bed level   Lower Body Bathing: Total assistance;+2 for physical assistance;Bed level   Upper Body Dressing : Moderate assistance;Bed level   Lower Body Dressing: Total assistance;+2 for physical assistance;Bed level       Toileting- Clothing Manipulation and Hygiene: +2 for physical assistance;Bed level Toileting - Clothing Manipulation Details (indicate cue type and reason): Pt with rectal tube. Pt leaking around tube.  Pt rolled L to R with rail to assist with toileting.       General ADL Comments: Pt with increased ability to roll in bed and pulled self up in bed with use of headboard/ rails.  Pt assumed +2 assist was being given with therapist on both sides, but no physical assist was provided and pt moved self up in bed.     Vision   Vision Assessment?: Vision impaired- to be further tested in functional context Additional Comments: R eye very red with drainage.   Perception     Praxis      Cognition Arousal/Alertness: Awake/alert Behavior During Therapy: WFL for tasks assessed/performed Overall Cognitive Status:  Within Functional Limits for tasks assessed                                 General Comments: Pt very resistant to doing anything on his own. Talked to him at length about bathing what he can reach, grooming and feeding himself and not relying on others for everything.  Pt did well when encourged to do more on his own.        Exercises     Shoulder Instructions       General Comments Pt with several areas of poor skin integrity.  Most covered with dressing/skin protector.    Pertinent Vitals/ Pain       Pain Assessment: Faces Faces Pain Scale: Hurts little more Pain Location: bilat  distal LEs Pain Descriptors / Indicators: Grimacing;Sore Pain Intervention(s): Limited activity within patient's tolerance;Monitored during session;Repositioned;Relaxation  Home Living                                          Prior Functioning/Environment              Frequency  Min 2X/week        Progress Toward Goals  OT Goals(current goals can now be found in the care plan section)  Progress towards OT goals: Progressing toward goals  Acute Rehab OT Goals Patient Stated Goal: to walk OT Goal Formulation: With patient Time For Goal Achievement: 05/16/18 Potential to Achieve Goals: Good ADL Goals Pt Will Perform Grooming: with min assist;sitting Pt Will Perform Upper Body Bathing: with mod assist;sitting Pt Will Perform Lower Body Bathing: with mod assist;sit to/from stand Pt Will Perform Upper Body Dressing: with mod assist;sitting Pt Will Perform Lower Body Dressing: with mod assist;sit to/from stand Pt Will Transfer to Toilet: with mod assist;stand pivot transfer;bedside commode Pt Will Perform Toileting - Clothing Manipulation and hygiene: with mod assist;sit to/from stand Additional ADL Goal #1: Pt will complete bed mobility at mod A +1 to prepare for OOB ADLs.  Plan Discharge plan remains appropriate    Co-evaluation                 AM-PAC PT "6 Clicks" Daily Activity     Outcome Measure   Help from another person eating meals?: A Little Help from another person taking care of personal grooming?: A Little Help from another person toileting, which includes using toliet, bedpan, or urinal?: Total Help from another person bathing (including washing, rinsing, drying)?: A Lot Help from another person to put on and taking off regular upper body clothing?: A Little Help from another person to put on and taking off regular lower body clothing?: Total 6 Click Score: 13    End of Session Equipment Utilized During Treatment: Oxygen  OT  Visit Diagnosis: Other abnormalities of gait and mobility (R26.89);Muscle weakness (generalized) (M62.81);Pain   Activity Tolerance Patient limited by fatigue   Patient Left in bed;with call bell/phone within reach;with nursing/sitter in room   Nurse Communication Mobility status;Other (comment)(need to check rectal catheter)        Time: 0488-8916 OT Time Calculation (min): 39 min  Charges: OT General Charges $OT Visit: 1 Visit OT Treatments $Self Care/Home Management : 38-52 mins  Jinger Neighbors, OTR/L [phone]   Glenford Peers 05/09/2018, 10:55 AM

## 2018-05-09 NOTE — Progress Notes (Addendum)
  Heart failure team will sign off as of 05/09/18  HF Medication Recommendations for Home: Apixiban 5 mg BID Atrovastatin 40 mg daily Amiodarone 400 mg BID x 1 week total then 200 mg bid x 1 week then 200 mg daily.  Coreg 12.5 mg BID Potassium 40 meq BID Torsemide 40 mg BID  Other recommendations (Labs,testing, etc): BMET, EKG at follow up  Follow up as an outpatient: 05/20/18 10 am in HF clinic (added to AVS)  Georgiana Shore, NP   Agree with the above.   [name] 05/10/2018

## 2018-05-10 DIAGNOSIS — Z7982 Long term (current) use of aspirin: Secondary | ICD-10-CM

## 2018-05-10 LAB — BASIC METABOLIC PANEL
Anion gap: 11 (ref 5–15)
BUN: 32 mg/dL — ABNORMAL HIGH (ref 6–20)
CO2: 34 mmol/L — ABNORMAL HIGH (ref 22–32)
Calcium: 8.8 mg/dL — ABNORMAL LOW (ref 8.9–10.3)
Chloride: 93 mmol/L — ABNORMAL LOW (ref 98–111)
Creatinine, Ser: 2.06 mg/dL — ABNORMAL HIGH (ref 0.61–1.24)
GFR calc Af Amer: 39 mL/min — ABNORMAL LOW (ref >60)
GFR calc non Af Amer: 34 mL/min — ABNORMAL LOW (ref >60)
Glucose, Bld: 202 mg/dL — ABNORMAL HIGH (ref 70–99)
Potassium: 4 mmol/L (ref 3.5–5.1)
Sodium: 138 mmol/L (ref 135–145)

## 2018-05-10 LAB — GLUCOSE, CAPILLARY
GLUCOSE-CAPILLARY: 279 mg/dL — AB (ref 70–99)
Glucose-Capillary: 239 mg/dL — ABNORMAL HIGH (ref 70–99)

## 2018-05-10 MED ORDER — ATORVASTATIN CALCIUM 40 MG PO TABS: 40.0000 mg | ORAL_TABLET | Freq: Every day | ORAL | 3 refills | Status: DC

## 2018-05-10 MED ORDER — LIDOCAINE 5 % EX PTCH: 1.0000 | MEDICATED_PATCH | CUTANEOUS | 0 refills | Status: DC

## 2018-05-10 MED ORDER — INSULIN GLARGINE 100 UNIT/ML ~~LOC~~ SOLN: 45.0000 [IU] | Freq: Every day | SUBCUTANEOUS | 3 refills | Status: DC

## 2018-05-10 MED ORDER — ASPIRIN 81 MG PO CHEW: 81.0000 mg | CHEWABLE_TABLET | Freq: Every day | ORAL | 3 refills | Status: DC

## 2018-05-10 MED ORDER — POTASSIUM CHLORIDE CRYS ER 20 MEQ PO TBCR: 40.0000 meq | EXTENDED_RELEASE_TABLET | Freq: Two times a day (BID) | ORAL | 3 refills | Status: DC

## 2018-05-10 MED ORDER — AMIODARONE HCL 400 MG PO TABS: 400.0000 mg | ORAL_TABLET | Freq: Two times a day (BID) | ORAL | 3 refills | Status: DC

## 2018-05-10 MED ORDER — TORSEMIDE 20 MG PO TABS: 40.0000 mg | ORAL_TABLET | Freq: Two times a day (BID) | ORAL | 3 refills | Status: DC

## 2018-05-10 MED ORDER — CARVEDILOL 12.5 MG PO TABS: 12.5000 mg | ORAL_TABLET | Freq: Two times a day (BID) | ORAL | 3 refills | Status: DC

## 2018-05-10 NOTE — Progress Notes (Signed)
Attempted to call report three times to [facility]. Phone call disconnected each time. Unable to give verbal report. Contact info written on discharge envelope accompanying Pt.

## 2018-05-10 NOTE — Progress Notes (Signed)
   Subjective:   [name] was examined at bedside and states he feels good today. Mentions he has caretaker at home who helps him out 3x a week and would like to go home. He states he cannot afford the rehab facility stay. Explained to the pt that his insurance has approved for discharge to SNF. He states that he still has to pay 20% for the stay and feels he cannot afford that 20%. Explained to the pt that we will touch base with social work. He also states he has never had hyperglycemia until he came to the hospital. His CBG was around 140-160 in the past.  Explained to the pt that his 140-160 is still consider hyperglycemic and he needs to be on medication to control his blood sugar. Chart review reveals significant change in his Hgb A1c from 6.6 last year to 10.5 on admission. He is on glipizide at home. He expressed concerns about the cost of his diabetic medications.  Explained to the patient that he will need to make sure he follows up with his PCP, Dr.Elkins. He also mentions concerns about blurry vision which is chronic. Stressed importance of getting regular eye exams to screen for diabetic retinopathy. He also follows with pain clinic and would like some of his pain medication on discharge. Explained to the patient that he needs to have a follow up appointment with his pain clinic. Patient expressed understanding.   Objective:  Vital signs in last 24 hours: Vitals:   05/09/18 1722 05/09/18 1731 05/09/18 2051 05/10/18 0509  BP: (!) 149/69 (!) 146/69 (!) 133/58   Pulse: 70     Resp:  (!) 24    Temp: 98.1 F (36.7 C)   97.8 F (36.6 C)  TempSrc: Oral   Oral  SpO2: 93%     Weight:      Height:       Const: In NAD, lying comfortably in bed  CV: RRR, no MGR Resp: CTABL, no wheezes, crackles, rhonchi  Abd: BS+, NTTP Ext: No pitting edema   Assessment/Plan:  Principal Problem:   Acute diastolic heart failure (HCC) Active Problems:   Pressure injury of skin   Acute metabolic  encephalopathy   Atrial fibrillation with RVR ([place])  [name] is a 57 year old obese gentleman with medical history significant for hypercapnic respiratory failure, 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  Acute on chronic combined systolic and diastolic heart failure exacerbation: Remains euvolemic and is responding very well to diuresis. Cardiology has agreed for patient to be discharged to SNF on Torsemide 40mg  BID and potassium 61mEq BID with outpatient follow up on 05/20/18 at 10AM  Atrial Fibrillations/p DCCV 05/08/18: In SR. Discharge with Apixaban 5mg  BID, amiodarone 400mg  BID, Coreg 12.5mg  BID  CAD s/p CABG in 2006: Dischrge with Lipitor 40mg  qhs and aspirin 81mg   Acute metabolic encephalopathy 2/2 acute on chronic hypercapnic respiratory failure-improved: Continue home supplemental oxygen   Type 2 Diabetes Mellitus:On Lantus 48U qhs   Dispo: Anticipated discharge to SNF ([place]) today   Jean Rosenthal, MD 05/10/2018, 10:53 AM Pager: [phone] IMTS PGY-1

## 2018-05-10 NOTE — Progress Notes (Signed)
[facility] SNF ready to accept patient today.  Percell Locus Jenniger Figiel LCSW [phone]

## 2018-05-10 NOTE — Progress Notes (Signed)
Patient asleep, but CSW confirmed that SNF stay is paid for 100% by insurance for up to 20 days.   Percell Locus Megumi Treaster LCSW [phone]

## 2018-05-10 NOTE — Progress Notes (Signed)
Nsg Discharge Note  Admit Date:  04/30/2018 Discharge date: 05/10/2018   [name]. to be D/C'd Skilled nursing facility per MD order.  AVS completed.   Discharge instructions provided to SNF.  Discharge Medication: Allergies as of 05/10/2018      Reactions   Morphine And Related Anaphylaxis   "makes me stop breathing"   Gabapentin Other (See Comments)   Blood in urine   Ivp Dye [iodinated Diagnostic Agents]    Passing out   Other    Steroids makes hearts race      Medication List    STOP taking these medications   atenolol 50 MG tablet Commonly known as:  TENORMIN   diltiazem 180 MG 24 hr capsule Commonly known as:  CARDIZEM CD   furosemide 40 MG tablet Commonly known as:  LASIX   furosemide 80 MG tablet Commonly known as:  LASIX   glipiZIDE 5 MG tablet Commonly known as:  GLUCOTROL   isosorbide mononitrate 30 MG 24 hr tablet Commonly known as:  IMDUR     TAKE these medications   albuterol 108 (90 Base) MCG/ACT inhaler Commonly known as:  PROVENTIL HFA;VENTOLIN HFA Inhale 2 puffs into the lungs every 6 (six) hours as needed for wheezing or shortness of breath.   amiodarone 400 MG tablet Commonly known as:  PACERONE Take 1 tablet (400 mg total) by mouth 2 (two) times daily.   apixaban 5 MG Tabs tablet Commonly known as:  ELIQUIS Take 1 tablet (5 mg total) by mouth 2 (two) times daily.   artificial tears Oint ophthalmic ointment Place 1 application into both eyes at bedtime as needed for dry eyes.   aspirin 81 MG chewable tablet Chew 1 tablet (81 mg total) by mouth daily.   atorvastatin 40 MG tablet Commonly known as:  LIPITOR Take 1 tablet (40 mg total) by mouth daily at 6 PM.   carbamazepine 200 MG tablet Commonly known as:  TEGRETOL Take 1 tablet (200 mg total) by mouth 2 (two) times daily. Take once at night for 4 days then twice daily thereafter   carvedilol 12.5 MG tablet Commonly known as:  COREG Take 1 tablet (12.5 mg total) by  mouth 2 (two) times daily with a meal.   colchicine 0.6 MG tablet Take 1 tablet (0.6 mg total) by mouth daily.   HYDROcodone-acetaminophen 10-325 MG tablet Commonly known as:  NORCO TAKE 1 TABLET BY MOUTH EVERY 6 HOURS AS NEEDED FOR PAIN MUST LAST 30 DAYS   insulin glargine 100 UNIT/ML injection Commonly known as:  LANTUS Inject 0.45 mLs (45 Units total) into the skin at bedtime for 120 doses.   lidocaine 5 % Commonly known as:  LIDODERM Place 1 patch onto the skin daily. Remove & Discard patch within 12 hours or as directed by MD Start taking on:  05/11/2018   nystatin powder Commonly known as:  MYCOSTATIN/NYSTOP APPLY  1  GRAM TOPICALLY TWICE DAILY AS NEEDED   potassium chloride SA 20 MEQ tablet Commonly known as:  K-DUR,KLOR-CON Take 2 tablets (40 mEq total) by mouth 2 (two) times daily.   sucralfate 1 g tablet Commonly known as:  CARAFATE Take 1 tablet (1 g total) by mouth 4 (four) times daily -  with meals and at bedtime.   tiZANidine 4 MG tablet Commonly known as:  ZANAFLEX 1 tablet four times daily as needed for muscle spasms   tobramycin-dexamethasone ophthalmic ointment Commonly known as:  TOBRADEX Place 1 application into the right  eye 2 (two) times daily at 10 am and 4 pm.   topiramate 100 MG tablet Commonly known as:  TOPAMAX Take 1 tablet (100 mg total) by mouth at bedtime.   torsemide 20 MG tablet Commonly known as:  DEMADEX Take 2 tablets (40 mg total) by mouth 2 (two) times daily.       Discharge Assessment: Vitals:   05/10/18 0909 05/10/18 1226  BP: (!) 111/38   Pulse: 64   Resp: 20   Temp: 97.8 F (36.6 C) 97.8 F (36.6 C)  SpO2: 94%    Skin clean, dry and intact without evidence of skin break down, no evidence of skin tears noted. IV catheter discontinued intact. Site without signs and symptoms of complications - no redness or edema noted at insertion site, patient denies c/o pain - only slight tenderness at site.  Dressing with slight  pressure applied.  D/c Instructions-Education: Discharge instructions given to patient/family with verbalized understanding. D/c education completed with patient/family including follow up instructions, medication list, d/c activities limitations if indicated, with other d/c instructions as indicated by MD - patient able to verbalize understanding, all questions fully answered. Patient instructed to return to ED, call 911, or call MD for any changes in condition.  Patient escorted via [place], and D/C home via private auto.  Davene Costain, RN 05/10/2018 4:33 PM

## 2018-05-10 NOTE — Care Management (Signed)
05-10-18  BENEFIT CHECK :   # 5.   S/W DANIEL   @ [facility]  # [phone]   ELIQUIS  5 MG BID COVER- YES CO-PAY- $ 23.34 TIER- 3 DRUG PRIOR APPROVAL- NO  PREFERRED PHARMACY : YES [facility] AND [facility]

## 2018-05-10 NOTE — Clinical Social Work Placement (Signed)
   CLINICAL SOCIAL WORK PLACEMENT  NOTE  Date:  05/10/2018  Patient Details  Name: Weston Kallman. MRN: [id number] Date of Birth: 12-28-1960  Clinical Social Work is seeking post-discharge placement for this patient at the [place] level of care (*CSW will initial, date and re-position this form in  chart as items are completed):  Yes   Patient/family provided with [place] Work Department's list of facilities offering this level of care within the geographic area requested by the patient (or if unable, by the patient's family).  Yes   Patient/family informed of their freedom to choose among providers that offer the needed level of care, that participate in Medicare, Medicaid or managed care program needed by the patient, have an available bed and are willing to accept the patient.  Yes   Patient/family informed of [facility]'s ownership interest in [facility] and [facility], as well as of the fact that they are under no obligation to receive care at these facilities.  PASRR submitted to EDS on 05/06/18     PASRR number received on 05/06/18     Existing PASRR number confirmed on       FL2 transmitted to all facilities in geographic area requested by pt/family on 05/06/18     FL2 transmitted to all facilities within larger geographic area on       Patient informed that his/her managed care company has contracts with or will negotiate with certain facilities, including the following:        Yes   Patient/family informed of bed offers received.  Patient chooses bed at [facility]     Physician recommends and patient chooses bed at      Patient to be transferred to [facility] on 05/10/18.  Patient to be transferred to facility by [facility]     Patient family notified on 05/10/18 of transfer.  Name of family member notified:  Spouse     PHYSICIAN       Additional Comment:     _______________________________________________ Benard Halsted, [place] 05/10/2018, 1:33 PM

## 2018-05-10 NOTE — Progress Notes (Signed)
Benefits check for Eliquis 5mg  twice a day in process. Whitman Hero RN,BSN,CM

## 2018-05-10 NOTE — Progress Notes (Signed)
[facility] will DC to: [facility] Anticipated DC date:05/10/18 Family notified: lvm for wife Transport LI:DCVU  Per MD patient ready for DC to [place]. RN, patient, patient's family, and facility notified of DC. Discharge Summary sent to facility. RN given number for report [phone] Room 1108P. DC packet on chart. Ambulance transport requested for patient.  CSW signing off.  [place], [place]

## 2018-05-10 NOTE — Progress Notes (Signed)
Would not use aspirin as patient is on Eliquis and has stable CAD.   Taper for amiodarone: 400 mg bid x 1 week, 200 mg bid x 1 week, 200 mg daily after that.   [name] 05/10/2018 2:39 PM

## 2018-05-10 NOTE — Progress Notes (Signed)
  Date: 05/10/2018  Patient name: [name].  Medical record number: [id number]  Date of birth: 1960-12-05   I have seen and evaluated this patient and I have discussed the plan of care with the house staff. Please see their note for complete details. I concur with their findings with the following additions/corrections: [name] was seen on team rounds.  He had several concerns which Dr. Eileen Stanford documented in his note.  None of those concerns will delay his discharge today.  Social worker confirmed that insurance will pay 100% of his SNF stay.  He will need an outpatient sleep study.  We will also started aspirin since he has history of CAD requiring CABG.  He is stable to be discharged to SNF.  Bartholomew Crews, MD 05/10/2018, 12:55 PM

## 2018-05-17 ENCOUNTER — Encounter: Payer: Medicare PPO | Admitting: Registered Nurse

## 2018-05-20 ENCOUNTER — Ambulatory Visit (HOSPITAL_COMMUNITY)
Admit: 2018-05-20 | Discharge: 2018-05-20 | Disposition: A | Discharge status: Home / Self Care | Payer: Medicare PPO | Attending: Internal Medicine | Admitting: Internal Medicine

## 2018-05-20 VITALS — BP 138/82 | HR 70 | Wt 373.0 lb

## 2018-05-20 DIAGNOSIS — Z87442 Personal history of urinary calculi: Secondary | ICD-10-CM | POA: Diagnosis not present

## 2018-05-20 DIAGNOSIS — Z809 Family history of malignant neoplasm, unspecified: Secondary | ICD-10-CM | POA: Diagnosis not present

## 2018-05-20 DIAGNOSIS — Z794 Long term (current) use of insulin: Secondary | ICD-10-CM | POA: Insufficient documentation

## 2018-05-20 DIAGNOSIS — E1122 Type 2 diabetes mellitus with diabetic chronic kidney disease: Secondary | ICD-10-CM | POA: Insufficient documentation

## 2018-05-20 DIAGNOSIS — I130 Hypertensive heart and chronic kidney disease with heart failure and stage 1 through stage 4 chronic kidney disease, or unspecified chronic kidney disease: Secondary | ICD-10-CM | POA: Insufficient documentation

## 2018-05-20 DIAGNOSIS — Z8673 Personal history of transient ischemic attack (TIA), and cerebral infarction without residual deficits: Secondary | ICD-10-CM | POA: Insufficient documentation

## 2018-05-20 DIAGNOSIS — I2510 Atherosclerotic heart disease of native coronary artery without angina pectoris: Secondary | ICD-10-CM | POA: Insufficient documentation

## 2018-05-20 DIAGNOSIS — N183 Chronic kidney disease, stage 3 unspecified: Secondary | ICD-10-CM

## 2018-05-20 DIAGNOSIS — Z7901 Long term (current) use of anticoagulants: Secondary | ICD-10-CM | POA: Diagnosis not present

## 2018-05-20 DIAGNOSIS — M797 Fibromyalgia: Secondary | ICD-10-CM | POA: Insufficient documentation

## 2018-05-20 DIAGNOSIS — Z951 Presence of aortocoronary bypass graft: Secondary | ICD-10-CM | POA: Diagnosis not present

## 2018-05-20 DIAGNOSIS — I255 Ischemic cardiomyopathy: Secondary | ICD-10-CM | POA: Diagnosis not present

## 2018-05-20 DIAGNOSIS — Z8249 Family history of ischemic heart disease and other diseases of the circulatory system: Secondary | ICD-10-CM | POA: Insufficient documentation

## 2018-05-20 DIAGNOSIS — Z8669 Personal history of other diseases of the nervous system and sense organs: Secondary | ICD-10-CM | POA: Insufficient documentation

## 2018-05-20 DIAGNOSIS — M109 Gout, unspecified: Secondary | ICD-10-CM | POA: Diagnosis not present

## 2018-05-20 DIAGNOSIS — I10 Essential (primary) hypertension: Secondary | ICD-10-CM | POA: Diagnosis not present

## 2018-05-20 DIAGNOSIS — I480 Paroxysmal atrial fibrillation: Secondary | ICD-10-CM | POA: Diagnosis not present

## 2018-05-20 DIAGNOSIS — F17210 Nicotine dependence, cigarettes, uncomplicated: Secondary | ICD-10-CM | POA: Insufficient documentation

## 2018-05-20 DIAGNOSIS — I252 Old myocardial infarction: Secondary | ICD-10-CM | POA: Insufficient documentation

## 2018-05-20 DIAGNOSIS — Z79899 Other long term (current) drug therapy: Secondary | ICD-10-CM | POA: Diagnosis not present

## 2018-05-20 DIAGNOSIS — I5032 Chronic diastolic (congestive) heart failure: Secondary | ICD-10-CM | POA: Insufficient documentation

## 2018-05-20 DIAGNOSIS — E669 Obesity, unspecified: Secondary | ICD-10-CM | POA: Diagnosis not present

## 2018-05-20 DIAGNOSIS — Z7982 Long term (current) use of aspirin: Secondary | ICD-10-CM | POA: Diagnosis not present

## 2018-05-20 LAB — BASIC METABOLIC PANEL
Anion gap: 11 (ref 5–15)
BUN: 11 mg/dL (ref 6–20)
CO2: 34 mmol/L — ABNORMAL HIGH (ref 22–32)
CREATININE: 1.87 mg/dL — AB (ref 0.61–1.24)
Calcium: 8.4 mg/dL — ABNORMAL LOW (ref 8.9–10.3)
Chloride: 97 mmol/L — ABNORMAL LOW (ref 98–111)
GFR calc Af Amer: 44 mL/min — ABNORMAL LOW (ref >60)
GFR, EST NON AFRICAN AMERICAN: 38 mL/min — AB (ref >60)
GLUCOSE: 210 mg/dL — AB (ref 70–99)
Potassium: 4.1 mmol/L (ref 3.5–5.1)
SODIUM: 142 mmol/L (ref 135–145)

## 2018-05-20 MED ORDER — AMIODARONE HCL 400 MG PO TABS: ORAL_TABLET | ORAL | 3 refills | Status: DC

## 2018-05-20 MED ORDER — TORSEMIDE 20 MG PO TABS: 60.0000 mg | ORAL_TABLET | Freq: Two times a day (BID) | ORAL | 3 refills | Status: DC

## 2018-05-20 NOTE — Progress Notes (Signed)
PCP: Primary Cardiologist:  HPI: 57 yo with history of CAD s/p CABG, chronic diastolic CHF, atrial fibrillation, obesity, prior CVA s/p acoustic neuroma surgery, HTN,  diabetes, and PAF.   He had a single vessel CABG with LIMA-LAD in 2009.  In 6/18, he was in the hospital with diastolic CHF and atrial fibrillation with RVR.  He was started on Eliquis that time but not cardioverted.  After leaving the hospital, he did not continue apixaban and did not have cardiology followup.   Admitted 04/30/2018  with increased dyspnea and mental status changes. He was thought to have acute metobolic encephalopathy with hypercapneic respiratory failure.  Creatinine initially 4.19, has decreased to 2.15 today.  Last prior creatinine was 1.4 in 6/18.  He was on Bipap initially. He has been in atrial fibrillation with RVR, was started on Coreg but HR still running high. ECHO was nondiagnostic so TEE completed with cardioversion. He was placed amiodarone. Diuresed with IV lasix and later transitioned to torsemide 40 mg twice a day. He was discharged to [facility].   Today he returns for post hospital follow up. Overall feeling fine. He remains on 3 liters oxygen. SOB with exertion. Denies PND/Orthopnea. Denies chest pain. Appetite ok. He eats whatever they give him.  No fever or chills. Able to take a few steps to the bathroom but not walking out of his room. He is not sure what his weight is at [facility]. Taking all medications provided at SNF. He would like to discharge to home from SNF.    ROS: All systems negative except as listed in HPI, PMH and Problem List.  SH:  Social History   Socioeconomic History  . Marital status: Married    Spouse name: Not on file  . Number of children: Not on file  . Years of education: Not on file  . Highest education level: Not on file  Occupational History  . Not on file  Social Needs  . Financial resource strain: Not on file  . Food insecurity:    Worry: Not on file   Inability: Not on file  . Transportation needs:    Medical: Not on file    Non-medical: Not on file  Tobacco Use  . Smoking status: Light Tobacco Smoker    Packs/day: 0.25    Years: 20.00    Pack years: 5.00    Types: Cigarettes  . Smokeless tobacco: Current User  . Tobacco comment: Rare tobacco.  "[name]".  Substance and Sexual Activity  . Alcohol use: No    Alcohol/week: 0.0 standard drinks  . Drug use: No  . Sexual activity: Yes  Lifestyle  . Physical activity:    Days per week: Not on file    Minutes per session: Not on file  . Stress: Not on file  Relationships  . Social connections:    Talks on phone: Not on file    Gets together: Not on file    Attends religious service: Not on file    Active member of club or organization: Not on file    Attends meetings of clubs or organizations: Not on file    Relationship status: Not on file  . Intimate partner violence:    Fear of current or ex partner: Not on file    Emotionally abused: Not on file    Physically abused: Not on file    Forced sexual activity: Not on file  Other Topics Concern  . Not on file  Social History Narrative  . Not on file    FH:  Family History  Problem Relation Age of Onset  . CAD Father   . Hypertension Mother   . Cancer Other        multiple relatives    Past Medical History:  Diagnosis Date  . Brain tumor ([place])    Takes Topamax so patient will not have headaches  . Bruises easily   . CAD (coronary artery disease)    Cath 09, 99% LAD  . Cardiomyopathy, ischemic    Ischemic  . CHF (congestive heart failure) ([place])   . Cyst near coccyx   . Cyst near tailbone   . Diabetes mellitus without complication (HCC)    borderline  . Edema of both legs    Takes Lasix  . Fibromyalgia   . Gout   . Headache(784.0)    with lights  . Hypertension    dr Percival Spanish  . Kidney stones   . Myocardial infarction ([place])   . Obesity   . Pneumonia    hx of  . Shortness of breath   .  Stroke [facility])    Affected vision, walking, and facial drooping on right face    Current Outpatient Medications  Medication Sig Dispense Refill  . albuterol (PROVENTIL HFA;VENTOLIN HFA) 108 (90 BASE) MCG/ACT inhaler Inhale 2 puffs into the lungs every 6 (six) hours as needed for wheezing or shortness of breath. (Patient not taking: Reported on 04/30/2018) 1 Inhaler 1  . amiodarone (PACERONE) 400 MG tablet Take 1 tablet (400 mg total) by mouth 2 (two) times daily. 60 tablet 3  . apixaban (ELIQUIS) 5 MG TABS tablet Take 1 tablet (5 mg total) by mouth 2 (two) times daily. (Patient not taking: Reported on 04/30/2018) 60 tablet 1  . artificial tears (LACRILUBE) OINT ophthalmic ointment Place 1 application into both eyes at bedtime as needed for dry eyes.     Marland Kitchen aspirin (ASPIRIN CHILDRENS) 81 MG chewable tablet Chew 1 tablet (81 mg total) by mouth daily. 30 tablet 3  . atorvastatin (LIPITOR) 40 MG tablet Take 1 tablet (40 mg total) by mouth daily at 6 PM. 30 tablet 3  . carbamazepine (TEGRETOL) 200 MG tablet Take 1 tablet (200 mg total) by mouth 2 (two) times daily. Take once at night for 4 days then twice daily thereafter 60 tablet 5  . carvedilol (COREG) 12.5 MG tablet Take 1 tablet (12.5 mg total) by mouth 2 (two) times daily with a meal. 60 tablet 3  . colchicine 0.6 MG tablet Take 1 tablet (0.6 mg total) by mouth daily. (Patient not taking: Reported on 04/30/2018) 30 tablet 1  . HYDROcodone-acetaminophen (NORCO) 10-325 MG tablet TAKE 1 TABLET BY MOUTH EVERY 6 HOURS AS NEEDED FOR PAIN MUST LAST 30 DAYS 120 tablet 0  . insulin glargine (LANTUS) 100 UNIT/ML injection Inject 0.45 mLs (45 Units total) into the skin at bedtime for 120 doses. 13.5 mL 3  . lidocaine (LIDODERM) 5 % Place 1 patch onto the skin daily. Remove & Discard patch within 12 hours or as directed by MD 30 patch 0  . nystatin (MYCOSTATIN/NYSTOP) powder APPLY  1  GRAM TOPICALLY TWICE DAILY AS NEEDED (Patient not taking: Reported on 04/30/2018)  180 g 3  . potassium chloride SA (K-DUR,KLOR-CON) 20 MEQ tablet Take 2 tablets (40 mEq total) by mouth 2 (two) times daily. 120 tablet 3  . sucralfate (CARAFATE) 1 g tablet Take 1 tablet (1 g total) by mouth 4 (  four) times daily -  with meals and at bedtime. (Patient not taking: Reported on 04/30/2018) 30 tablet 0  . tiZANidine (ZANAFLEX) 4 MG tablet 1 tablet four times daily as needed for muscle spasms (Patient not taking: Reported on 04/30/2018) 270 tablet 2  . tobramycin-dexamethasone (TOBRADEX) ophthalmic ointment Place 1 application into the right eye 2 (two) times daily at 10 am and 4 pm. (Patient not taking: Reported on 04/30/2018) 3.5 g 0  . topiramate (TOPAMAX) 100 MG tablet Take 1 tablet (100 mg total) by mouth at bedtime. (Patient not taking: Reported on 04/30/2018) 90 tablet 5  . torsemide (DEMADEX) 20 MG tablet Take 2 tablets (40 mg total) by mouth 2 (two) times daily. 120 tablet 3   No current facility-administered medications for this encounter.     Vitals:   05/20/18 1007  BP: 138/82  Pulse: 70  SpO2: 91%  Weight: (!) 169.2 kg (373 lb)   Filed Weights   05/20/18 1007  Weight: (!) 169.2 kg (373 lb)   PHYSICAL EXAM:  General:  Appears chronically ill. No resp difficulty. Arrived in a wheel chair.  HEENT: normal Neck: supple. JVP difficult to assess.  Carotids 2+ bilaterally; no bruits. No lymphadenopathy or thryomegaly appreciated. Cor: PMI normal. Regular rate & rhythm. No rubs, gallops or murmurs. Lungs: clear on 3 liters oxygen.  Abdomen: obese, soft, nontender, nondistended. No hepatosplenomegaly. No bruits or masses. Good bowel sounds. Extremities: no cyanosis, clubbing, rash, R and LLE 1-2+ edema Neuro: alert & orientedx3, cranial nerves grossly intact. Moves all 4 extremities w/o difficulty. Affect pleasant.   ECG:SR 66 bpm  1st degree heart block PR 220 ms.   ASSESSMENT & PLAN: 1. H/O metabolic encephalopathy: - Needs outpatient sleep study.  - Continue BiPAP  qHS 2.  Chronic Diastolic CHF:  TEE 0/32 showed EF 55-60% with mildly dilated/mildly dysfunctional RV.  NYHA III. Volume status elevated. Increase torsemide to 60 mg twice a day.  .Check BMET today.  3. PAF:  -S/P DC-CV 04/2018.  -Maintaining NSR.  - Cut back amio to 200 mg twice a day x7 days then amio 200 mg daily.  - Continue eliquis 5 mg twice a day.  Next visit check TSH, LFTs.  4. CKD stage 3:  Check BMET today--->creatinine 1.8. Stable. Check BMEt in 7 days.  5. CAD: S/p LIMA-LAD in 2009.  - No S/S ischemia  - No ASA given Eliquis use.   Continue rosuvastatin 20 mg 6.  HTN: - Stable.    Follow up in 4-6  weeks. Needs HH if he discharges from SNF [place] NP-C  2:23 PM

## 2018-05-20 NOTE — Patient Instructions (Signed)
DECREASE Amiodarone to 200 mg, one tab twice a day for 7 days, then take 200 mg one tab daily thereafter  INCREASE Torsemide to 60 mg twice a day  Labs today We will only contact you if something comes back abnormal or we need to make some changes. Otherwise no news is good news!  Labs needed in 7 days, will be done at [place] physician recommends that you schedule a follow-up appointment in: 6 weeks  in the [facility]NP[facility]   Do the following things EVERYDAY: 1) Weigh yourself in the morning before breakfast. Write it down and keep it in a log. 2) Take your medicines as prescribed 3) Eat low salt foods-Limit salt (sodium) to 2000 mg per day.  4) Stay as active as you can everyday 5) Limit all fluids for the day to less than 2 liters

## 2018-05-23 ENCOUNTER — Other Ambulatory Visit (HOSPITAL_COMMUNITY): Payer: Self-pay | Admitting: *Deleted

## 2018-05-23 MED ORDER — AMIODARONE HCL 200 MG PO TABS: ORAL_TABLET | ORAL | 3 refills | Status: DC

## 2018-05-28 ENCOUNTER — Other Ambulatory Visit (HOSPITAL_COMMUNITY): Payer: Self-pay

## 2018-05-28 MED ORDER — TORSEMIDE 20 MG PO TABS: 60.0000 mg | ORAL_TABLET | Freq: Two times a day (BID) | ORAL | 3 refills | Status: DC

## 2018-05-28 MED ORDER — APIXABAN 5 MG PO TABS: 5.0000 mg | ORAL_TABLET | Freq: Two times a day (BID) | ORAL | 5 refills | Status: DC

## 2018-05-31 ENCOUNTER — Encounter: Payer: Medicare PPO | Attending: Physical Medicine & Rehabilitation | Admitting: Registered Nurse

## 2018-05-31 ENCOUNTER — Other Ambulatory Visit: Payer: Self-pay

## 2018-05-31 VITALS — BP 130/80 | HR 63 | Ht 70.0 in | Wt 368.0 lb

## 2018-05-31 DIAGNOSIS — I255 Ischemic cardiomyopathy: Secondary | ICD-10-CM | POA: Insufficient documentation

## 2018-05-31 DIAGNOSIS — M797 Fibromyalgia: Secondary | ICD-10-CM | POA: Diagnosis not present

## 2018-05-31 DIAGNOSIS — G629 Polyneuropathy, unspecified: Secondary | ICD-10-CM | POA: Insufficient documentation

## 2018-05-31 DIAGNOSIS — R51 Headache: Secondary | ICD-10-CM | POA: Insufficient documentation

## 2018-05-31 DIAGNOSIS — Z760 Encounter for issue of repeat prescription: Secondary | ICD-10-CM | POA: Insufficient documentation

## 2018-05-31 DIAGNOSIS — G8929 Other chronic pain: Secondary | ICD-10-CM | POA: Diagnosis present

## 2018-05-31 DIAGNOSIS — M62838 Other muscle spasm: Secondary | ICD-10-CM | POA: Insufficient documentation

## 2018-05-31 DIAGNOSIS — I252 Old myocardial infarction: Secondary | ICD-10-CM | POA: Diagnosis not present

## 2018-05-31 DIAGNOSIS — M109 Gout, unspecified: Secondary | ICD-10-CM | POA: Insufficient documentation

## 2018-05-31 DIAGNOSIS — Z8673 Personal history of transient ischemic attack (TIA), and cerebral infarction without residual deficits: Secondary | ICD-10-CM | POA: Insufficient documentation

## 2018-05-31 DIAGNOSIS — Z9889 Other specified postprocedural states: Secondary | ICD-10-CM | POA: Insufficient documentation

## 2018-05-31 DIAGNOSIS — I2510 Atherosclerotic heart disease of native coronary artery without angina pectoris: Secondary | ICD-10-CM | POA: Diagnosis not present

## 2018-05-31 DIAGNOSIS — R600 Localized edema: Secondary | ICD-10-CM | POA: Insufficient documentation

## 2018-05-31 DIAGNOSIS — Z5181 Encounter for therapeutic drug level monitoring: Secondary | ICD-10-CM

## 2018-05-31 DIAGNOSIS — Z951 Presence of aortocoronary bypass graft: Secondary | ICD-10-CM | POA: Diagnosis not present

## 2018-05-31 DIAGNOSIS — Z8249 Family history of ischemic heart disease and other diseases of the circulatory system: Secondary | ICD-10-CM | POA: Diagnosis not present

## 2018-05-31 DIAGNOSIS — G510 Bell's palsy: Secondary | ICD-10-CM | POA: Insufficient documentation

## 2018-05-31 DIAGNOSIS — I509 Heart failure, unspecified: Secondary | ICD-10-CM | POA: Insufficient documentation

## 2018-05-31 DIAGNOSIS — G894 Chronic pain syndrome: Secondary | ICD-10-CM

## 2018-05-31 DIAGNOSIS — Z930 Tracheostomy status: Secondary | ICD-10-CM | POA: Insufficient documentation

## 2018-05-31 DIAGNOSIS — D333 Benign neoplasm of cranial nerves: Secondary | ICD-10-CM | POA: Insufficient documentation

## 2018-05-31 DIAGNOSIS — Z79891 Long term (current) use of opiate analgesic: Secondary | ICD-10-CM | POA: Diagnosis not present

## 2018-05-31 DIAGNOSIS — I110 Hypertensive heart disease with heart failure: Secondary | ICD-10-CM | POA: Diagnosis not present

## 2018-05-31 DIAGNOSIS — E119 Type 2 diabetes mellitus without complications: Secondary | ICD-10-CM | POA: Insufficient documentation

## 2018-05-31 DIAGNOSIS — Z809 Family history of malignant neoplasm, unspecified: Secondary | ICD-10-CM | POA: Insufficient documentation

## 2018-05-31 DIAGNOSIS — Z87442 Personal history of urinary calculi: Secondary | ICD-10-CM | POA: Diagnosis not present

## 2018-05-31 DIAGNOSIS — F17210 Nicotine dependence, cigarettes, uncomplicated: Secondary | ICD-10-CM | POA: Insufficient documentation

## 2018-05-31 DIAGNOSIS — G609 Hereditary and idiopathic neuropathy, unspecified: Secondary | ICD-10-CM

## 2018-05-31 MED ORDER — HYDROCODONE-ACETAMINOPHEN 10-325 MG PO TABS: ORAL_TABLET | ORAL | 0 refills | Status: DC

## 2018-05-31 NOTE — Patient Instructions (Signed)
Call Office the week of 08/05/2018. Regarding your medication

## 2018-05-31 NOTE — Progress Notes (Signed)
Subjective:    Patient ID: Frank Pat., male    DOB: 02-11-1961, 57 y.o.   MRN: [id number]  HPI: [name] is a 57 year old male who returns for follow up appointment for chronic pain and medication refill. He states his pain is located in his right eye, right temporal and right  facial tenderness. He rates his pain 8. His current exercise regime is walking with walker.   [name] was admitted to [facility] on 04/30/18 and discharged on 05/10/18 to [facility] 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  respiratory failure.  [name] Morphine Equivalent is 40.00 MME. Last UDS was Performed on 03/18/2018.   Pain Inventory Average Pain 8 Pain Right Now 8 My pain is burning  In the last 24 hours, has pain interfered with the following? General activity 6 Relation with others 10 Enjoyment of life 10 What TIME of day is your pain at its worst? night Sleep (in general) Fair  Pain is worse with: walking Pain improves with: medication Relief from Meds: 5  Mobility walk with assistance use a walker how many minutes can you walk? 10 ability to climb steps?  yes do you drive?  no  Function disabled: date disabled n/a I need assistance with the following:  bathing  Neuro/Psych loss of taste or smell  Prior Studies Any changes since last visit?  no  Physicians involved in your care Any changes since last visit?  no   Family History  Problem Relation Age of Onset  . CAD Father   . Hypertension Mother   . Cancer Other        multiple relatives   Social History   Socioeconomic History  . Marital status: Married    Spouse name: Not on file  . Number of children: Not on file  . Years of education: Not on file  . Highest education level: Not on file  Occupational History  . Not on file  Social Needs    . Financial resource strain: Not on file  . Food insecurity:    Worry: Not on file    Inability: Not on file  . Transportation needs:    Medical: Not on file    Non-medical: Not on file  Tobacco Use  . Smoking status: Light Tobacco Smoker    Packs/day: 0.25    Years: 20.00    Pack years: 5.00    Types: Cigarettes  . Smokeless tobacco: Current User  . Tobacco comment: Rare tobacco.  "[name]".  Substance and Sexual Activity  . Alcohol use: No    Alcohol/week: 0.0 standard drinks  . Drug use: No  . Sexual activity: Yes  Lifestyle  . Physical activity:    Days per week: Not on file    Minutes per session: Not on file  . Stress: Not on file  Relationships  . Social connections:    Talks on phone: Not on file    Gets together: Not on file    Attends religious service: Not on file    Active member of club or organization: Not on file    Attends meetings of clubs or organizations: Not on file    Relationship status: Not on file  Other Topics Concern  . Not on file  Social History Narrative  . Not on file   Past  Surgical History:  Procedure Laterality Date  . 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 ANAL FISTULECTOMY    . CARDIOVERSION N/A 05/08/2018   Procedure: CARDIOVERSION;  Surgeon: [name], MD;  Location: [facility] ENDOSCOPY;  Service: Cardiovascular;  Laterality: N/A;  . CORONARY ARTERY BYPASS GRAFT     LIMA to LAD 2009  . CRANIOTOMY Right 11/20/2012   Procedure: CRANIOTOMY REPAIR DURAL/CENTRAL SPINAL FLUID LEAK;  Surgeon: [name], MD;  Location: [facility] NEURO ORS;  Service: Neurosurgery;  Laterality: Right;  . EYE SURGERY     to coorect lid and double vision  . MEDIAN RECTUS REPAIR Right 02/16/2016   Procedure: RIGHT LATERAL RECTUS RESECTION; SUPERIOR RECTUS RESECTION RIGHT EYE; RIGHT SUPERIOR RECTUS RECESSION; LATERAL TARSAL  STRIP RIGHT LOWER EYELID;  Surgeon: [name], MD;  Location: [facility];  Service: Ophthalmology;  Laterality: Right;  . PLACEMENT OF LUMBAR DRAIN N/A 11/20/2012   Procedure: Attempted PLACEMENT OF LUMBAR DRAIN;  Surgeon: [name], MD;  Location: [facility] NEURO ORS;  Service: Neurosurgery;  Laterality: N/A;  . RETROSIGMOID CRANIECTOMY FOR TUMOR RESECTION Right 10/11/2012   Procedure: RETROSIGMOID CRANIECTOMY FOR TUMOR RESECTION;  Surgeon: [name], MD;  Location: [facility] NEURO ORS;  Service: Neurosurgery;  Laterality: Right;  Craniotomy for acoustic neuroma  . TEE WITHOUT CARDIOVERSION N/A 05/08/2018   Procedure: TRANSESOPHAGEAL ECHOCARDIOGRAM (TEE);  Surgeon: [name], MD;  Location: [facility] END[facility]COPY;  Service: Cardiovascular;  Laterality: N/A;  . TRACHEOSTOMY TUBE PLACEMENT N/A 10/11/2012   Procedure: TRACHEOSTOMY;  Surgeon: Ascencion Dike, MD;  Location: [facility] NEURO ORS;  Service: ENT;  Laterality: N/A;  . UMBILICAL HERNIA REPAIR     Past Medical History:  Diagnosis Date  . Brain tumor ([place])    Takes Topamax so patient will not have headaches  . Bruises easily   . CAD (coronary artery disease)    Cath 09, 99% LAD  . Cardiomyopathy, ischemic    Ischemic  . CHF (congestive heart failure) ([place])   . Cyst near coccyx   . Cyst near tailbone   . Diabetes mellitus without complication (HCC)    borderline  . Edema of both legs    Takes Lasix  . Fibromyalgia   . Gout   . Headache(784.0)    with lights  . Hypertension    dr Percival Spanish  . Kidney stones   . Myocardial infarction ([place])   . Obesity   . Pneumonia    hx of  . Shortness of breath   . Stroke [name])    Affected vision, walking, and facial drooping on right face   BP 130/80   Pulse 63   Ht 5\' 10"  (1.778 m)   Wt (!) 368 lb (166.9 kg)   SpO2 94%   BMI 52.80 kg/m   Opioid Risk Score:   Fall Risk Score:  `1  Depression screen PHQ 2/9  Depression screen [facility] Arellano 2/9 05/31/2018 11/09/2017 09/10/2017 07/16/2017 05/14/2017 02/15/2015    Decreased Interest 1 1 1  0 0 0  Down, Depressed, Hopeless 1 1 1  0 0 0  PHQ - 2 Score 2 2 2  0 0 0  Altered sleeping - - - - - 0  Tired, decreased energy - - - - - 0  Change in appetite - - - - - 0  Feeling bad or failure about yourself  - - - - -  0  Trouble concentrating - - - - - 0  Moving slowly or fidgety/restless - - - - - 0  Suicidal thoughts - - - - - 0  PHQ-9 Score - - - - - 0    Review of Systems  Constitutional: Positive for unexpected weight change.  Eyes: Negative.   Respiratory: Negative.   Cardiovascular: Negative.   Gastrointestinal: Negative.   Endocrine: Negative.   Genitourinary: Positive for dysuria.  Musculoskeletal: Negative.   Skin: Negative.   Allergic/Immunologic: Negative.   Hematological: Negative.   Psychiatric/Behavioral: Negative.   All other systems reviewed and are negative.      Objective:   Physical Exam  Constitutional: He is oriented to person, place, and time. He appears well-developed and well-nourished.  HENT:  Head: Normocephalic and atraumatic.  Neck: Normal range of motion. Neck supple.  Cardiovascular: Normal rate and regular rhythm.  Pulmonary/Chest: Effort normal and breath sounds normal.  Musculoskeletal: He exhibits edema.  Normal Muscle Bulk and Muscle Testing Reveals:  Upper Extremities: Full ROM and Muscle Strength 5/5 Lumbar Paraspinal Tenderness: L-3-L-5 Lower Extremities: Full ROM and Muscle Strength 5/5 Arises from Table Slowly using walker for support Narrow Based Gait  Neurological: He is alert and oriented to person, place, and time.  Skin: Skin is warm and dry.  Psychiatric: He has a normal mood and affect. His behavior is normal.  Nursing note and vitals reviewed.         Assessment & Plan:  1. 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 S/P RIGHT LATERAL RECTUS RESECTION; SUPERIOR RECTUS RESECTION RIGHT EYE; RIGHT SUPERIOR  RECTUS RECESSION; LATERAL TARSAL STRIP RIGHT LOWER EYELID with Dr. Frederico Hamman on 02/16/16. Refilled: Hydrocodone 10/325mg  one tablet every 6 hours as needed #120. Second scripte-scribefor the following month. 10//06/2018 We will continue the opioid monitoring program, this consists of regular clinic visits, examinations, urine drug screen, pill counts as well as use of [place] Controlled Substance Reporting System. Continue eye ointment as ordered. 2. Headaches right temporal area related to tumor/surgery.No complaints today.Continue Topamax. 05/31/2018 3. Lower extremity edema, morbid obesity. Continue to Monitor. PCP Following. 05/31/2018 4. Muscle Spasm: Continue Tizanidine. 05/31/2018 5. Peripheral Neuropathy:Continue Topamax and Tegretol Continue to Monitor. 05/31/2018  20 minutes of face to face patient care time was spent during this visit. All questions were encouraged and answered.  F/U in 2 months

## 2018-06-03 ENCOUNTER — Encounter: Payer: Self-pay | Admitting: Registered Nurse

## 2018-06-07 LAB — TOXASSURE SELECT,+ANTIDEPR,UR

## 2018-06-10 ENCOUNTER — Telehealth: Payer: Self-pay | Admitting: *Deleted

## 2018-06-10 NOTE — Telephone Encounter (Signed)
Urine drug screen for this encounter is consistent for prescribed medication 

## 2018-07-01 ENCOUNTER — Encounter (HOSPITAL_COMMUNITY): Payer: Self-pay

## 2018-07-01 ENCOUNTER — Other Ambulatory Visit: Payer: Self-pay

## 2018-07-01 ENCOUNTER — Ambulatory Visit (HOSPITAL_COMMUNITY)
Admission: RE | Admit: 2018-07-01 | Discharge: 2018-07-01 | Disposition: A | Discharge status: Home / Self Care | Payer: Medicare PPO | Source: Ambulatory Visit | Attending: Cardiology | Admitting: Cardiology

## 2018-07-01 VITALS — BP 140/90 | HR 64 | Wt 368.4 lb

## 2018-07-01 DIAGNOSIS — Z7901 Long term (current) use of anticoagulants: Secondary | ICD-10-CM | POA: Insufficient documentation

## 2018-07-01 DIAGNOSIS — E1122 Type 2 diabetes mellitus with diabetic chronic kidney disease: Secondary | ICD-10-CM | POA: Diagnosis not present

## 2018-07-01 DIAGNOSIS — Z951 Presence of aortocoronary bypass graft: Secondary | ICD-10-CM | POA: Insufficient documentation

## 2018-07-01 DIAGNOSIS — I252 Old myocardial infarction: Secondary | ICD-10-CM | POA: Insufficient documentation

## 2018-07-01 DIAGNOSIS — M109 Gout, unspecified: Secondary | ICD-10-CM | POA: Diagnosis not present

## 2018-07-01 DIAGNOSIS — Z87891 Personal history of nicotine dependence: Secondary | ICD-10-CM | POA: Insufficient documentation

## 2018-07-01 DIAGNOSIS — I2510 Atherosclerotic heart disease of native coronary artery without angina pectoris: Secondary | ICD-10-CM | POA: Insufficient documentation

## 2018-07-01 DIAGNOSIS — I255 Ischemic cardiomyopathy: Secondary | ICD-10-CM | POA: Insufficient documentation

## 2018-07-01 DIAGNOSIS — Z79899 Other long term (current) drug therapy: Secondary | ICD-10-CM | POA: Diagnosis not present

## 2018-07-01 DIAGNOSIS — I5032 Chronic diastolic (congestive) heart failure: Secondary | ICD-10-CM | POA: Insufficient documentation

## 2018-07-01 DIAGNOSIS — I10 Essential (primary) hypertension: Secondary | ICD-10-CM

## 2018-07-01 DIAGNOSIS — Z6841 Body Mass Index (BMI) 40.0 and over, adult: Secondary | ICD-10-CM | POA: Diagnosis not present

## 2018-07-01 DIAGNOSIS — I130 Hypertensive heart and chronic kidney disease with heart failure and stage 1 through stage 4 chronic kidney disease, or unspecified chronic kidney disease: Secondary | ICD-10-CM | POA: Insufficient documentation

## 2018-07-01 DIAGNOSIS — N183 Chronic kidney disease, stage 3 unspecified: Secondary | ICD-10-CM

## 2018-07-01 DIAGNOSIS — Z8249 Family history of ischemic heart disease and other diseases of the circulatory system: Secondary | ICD-10-CM | POA: Diagnosis not present

## 2018-07-01 DIAGNOSIS — Z8673 Personal history of transient ischemic attack (TIA), and cerebral infarction without residual deficits: Secondary | ICD-10-CM | POA: Insufficient documentation

## 2018-07-01 DIAGNOSIS — Z87442 Personal history of urinary calculi: Secondary | ICD-10-CM | POA: Insufficient documentation

## 2018-07-01 DIAGNOSIS — I480 Paroxysmal atrial fibrillation: Secondary | ICD-10-CM | POA: Insufficient documentation

## 2018-07-01 DIAGNOSIS — Z794 Long term (current) use of insulin: Secondary | ICD-10-CM | POA: Insufficient documentation

## 2018-07-01 DIAGNOSIS — E669 Obesity, unspecified: Secondary | ICD-10-CM | POA: Insufficient documentation

## 2018-07-01 DIAGNOSIS — M797 Fibromyalgia: Secondary | ICD-10-CM | POA: Insufficient documentation

## 2018-07-01 LAB — BASIC METABOLIC PANEL
Anion gap: 11 (ref 5–15)
BUN: 21 mg/dL — AB (ref 6–20)
CALCIUM: 9.3 mg/dL (ref 8.9–10.3)
CO2: 26 mmol/L (ref 22–32)
CREATININE: 1.93 mg/dL — AB (ref 0.61–1.24)
Chloride: 102 mmol/L (ref 98–111)
GFR calc Af Amer: 43 mL/min — ABNORMAL LOW (ref >60)
GFR, EST NON AFRICAN AMERICAN: 37 mL/min — AB (ref >60)
GLUCOSE: 168 mg/dL — AB (ref 70–99)
Potassium: 4.4 mmol/L (ref 3.5–5.1)
Sodium: 139 mmol/L (ref 135–145)

## 2018-07-01 MED ORDER — AMIODARONE HCL 100 MG PO TABS: 100.0000 mg | ORAL_TABLET | Freq: Every day | ORAL | 3 refills | Status: DC

## 2018-07-01 NOTE — Patient Instructions (Addendum)
DECREASE Amiodarone to 100 mg , one tab daily   Labs today We will only contact you if something comes back abnormal or we need to make some changes. Otherwise no news is good news!  Your physician recommends that you schedule a follow-up appointment in: 3 months  in the [facility] Pract[facility] ([facility]/NP) [facility]    Do the following things EVERYDAY: 1) Weigh yourself in the morning before breakfast. Write it down and keep it in a log. 2) Take your medicines as prescribed 3) Eat low salt foods-Limit salt (sodium) to 2000 mg per day.  4) Stay as active as you can everyday 5) Limit all fluids for the day to less than 2 liters

## 2018-07-01 NOTE — Progress Notes (Signed)
PCP: Dr Lynnae January  Primary Cardiologist: Dr Aundra Dubin   HPI: 57 yo with history of CAD s/p CABG, chronic diastolic CHF, atrial fibrillation, obesity, prior CVA s/p acoustic neuroma surgery, HTN,  diabetes, and PAF.   He had a single vessel CABG with LIMA-LAD in 2009.  In 6/18, he was in the hospital with diastolic CHF and atrial fibrillation with RVR.  He was started on Eliquis that time but not cardioverted.  After leaving the hospital, he did not continue apixaban and did not have cardiology followup.   Admitted 04/30/2018  with increased dyspnea and mental status changes. He was thought to have acute metobolic encephalopathy with hypercapneic respiratory failure.  Creatinine initially 4.19, has decreased to 2.15 today.  Last prior creatinine was 1.4 in 6/18.  He was on Bipap initially. He has been in atrial fibrillation with RVR, was started on Coreg but HR still running high. ECHO was nondiagnostic so TEE completed with cardioversion. He was placed amiodarone. Diuresed with IV lasix and later transitioned to torsemide 40 mg twice a day. He was discharged to [facility].   Today he returns for HF follow up with his wife. He was discharges from SNF on 9/302019.  Last visit torsemide was increased to 60 mg twice a day. Overall feeling fine. SOB if he moves around the house much. He usually doesn't leave his house.  Denies PND/Orthopnea. Appetite ok. No fever or chills. He is not weighing at home because he doesn't have a scale. . Taking all medications. Drinking < 2 liters per day.Tries to limit salt intake.    ROS: All systems negative except as listed in HPI, PMH and Problem List.  SH:  Social History   Socioeconomic History  . Marital status: Married    Spouse name: Not on file  . Number of children: Not on file  . Years of education: Not on file  . Highest education level: Not on file  Occupational History  . Not on file  Social Needs  . Financial resource strain: Not on file  . Food  insecurity:    Worry: Not on file    Inability: Not on file  . Transportation needs:    Medical: Not on file    Non-medical: Not on file  Tobacco Use  . Smoking status: Former Smoker    Packs/day: 0.25    Years: 20.00    Pack years: 5.00    Types: Cigarettes  . Smokeless tobacco: Current User  . Tobacco comment: Rare tobacco.  "[name]".  Substance and Sexual Activity  . Alcohol use: No    Alcohol/week: 0.0 standard drinks  . Drug use: No  . Sexual activity: Yes  Lifestyle  . Physical activity:    Days per week: Not on file    Minutes per session: Not on file  . Stress: Not on file  Relationships  . Social connections:    Talks on phone: Not on file    Gets together: Not on file    Attends religious service: Not on file    Active member of club or organization: Not on file    Attends meetings of clubs or organizations: Not on file    Relationship status: Not on file  . Intimate partner violence:    Fear of current or ex partner: Not on file    Emotionally abused: Not on file    Physically abused: Not on file    Forced sexual activity: Not on file  Other Topics Concern  . Not on file  Social History Narrative  . Not on file    FH:  Family History  Problem Relation Age of Onset  . CAD Father   . Hypertension Mother   . Cancer Other        multiple relatives    Past Medical History:  Diagnosis Date  . Brain tumor ([place])    Takes Topamax so patient will not have headaches  . Bruises easily   . CAD (coronary artery disease)    Cath 09, 99% LAD  . Cardiomyopathy, ischemic    Ischemic  . CHF (congestive heart failure) ([place])   . Cyst near coccyx   . Cyst near tailbone   . Diabetes mellitus without complication (HCC)    borderline  . Edema of both legs    Takes Lasix  . Fibromyalgia   . Gout   . Headache(784.0)    with lights  . Hypertension    dr Percival Spanish  . Kidney stones   . Myocardial infarction ([place])   . Obesity   . Pneumonia    hx of   . Shortness of breath   . Stroke [facility])    Affected vision, walking, and facial drooping on right face    Current Outpatient Medications  Medication Sig Dispense Refill  . amiodarone (PACERONE) 200 MG tablet Take 1 tablet (200 mg total) by mouth 2 (two) times daily for 7 days, THEN 1 tablet (200 mg total) daily. 45 tablet 3  . apixaban (ELIQUIS) 5 MG TABS tablet Take 1 tablet (5 mg total) by mouth 2 (two) times daily. 60 tablet 5  . artificial tears (LACRILUBE) OINT ophthalmic ointment Place 1 application into both eyes at bedtime as needed for dry eyes.     . carvedilol (COREG) 12.5 MG tablet Take 1 tablet (12.5 mg total) by mouth 2 (two) times daily with a meal. 60 tablet 3  . colchicine 0.6 MG tablet Take 1 tablet (0.6 mg total) by mouth daily. 30 tablet 1  . HYDROcodone-acetaminophen (NORCO) 10-325 MG tablet TAKE 1 TABLET BY MOUTH EVERY 6 HOURS AS NEEDED FOR PAIN MUST LAST 30 DAYS 120 tablet 0  . insulin glargine (LANTUS) 100 UNIT/ML injection Inject 0.45 mLs (45 Units total) into the skin at bedtime for 120 doses. 13.5 mL 3  . lidocaine (LIDODERM) 5 % Place 1 patch onto the skin daily. Remove & Discard patch within 12 hours or as directed by MD 30 patch 0  . nystatin (MYCOSTATIN/NYSTOP) powder APPLY  1  GRAM TOPICALLY TWICE DAILY AS NEEDED 180 g 3  . potassium chloride SA (K-DUR,KLOR-CON) 20 MEQ tablet Take 20 mEq by mouth daily.    . sucralfate (CARAFATE) 1 g tablet Take 1 tablet (1 g total) by mouth 4 (four) times daily -  with meals and at bedtime. 30 tablet 0  . tiZANidine (ZANAFLEX) 4 MG tablet 1 tablet four times daily as needed for muscle spasms 270 tablet 2  . topiramate (TOPAMAX) 100 MG tablet Take 1 tablet (100 mg total) by mouth at bedtime. 90 tablet 5  . torsemide (DEMADEX) 20 MG tablet Take 3 tablets (60 mg total) by mouth 2 (two) times daily. 120 tablet 3   No current facility-administered medications for this encounter.     Vitals:   07/01/18 1139  BP: 140/90  Pulse:  64  SpO2: 97%  Weight: (!) 167.1 kg (368 lb 6.4 oz)   Filed Weights   07/01/18 1139  Weight: (!) 167.1 kg (368 lb 6.4 oz)   Wt Readings from Last 3 Encounters:  07/01/18 (!) 167.1 kg (368 lb 6.4 oz)  05/31/18 (!) 166.9 kg (368 lb)  05/20/18 (!) 169.2 kg (373 lb)    PHYSICAL EXAM: General: Appears chronically ill in a wheel chair. No resp difficulty. On 2 liters oxygen.  HEENT: normal R facial droop.  Neck: supple. no JVD. Carotids 2+ bilat; no bruits. No lymphadenopathy or thryomegaly appreciated. Cor: PMI nondisplaced. Regular rate & rhythm. No rubs, gallops or murmurs. Lungs: clear Abdomen: obese, soft, nontender, nondistended. No hepatosplenomegaly. No bruits or masses. Good bowel sounds. Extremities: no cyanosis, clubbing, rash, edema Neuro: alert & orientedx3, cranial nerves grossly intact. moves all 4 extremities w/o difficulty. Affect pleasant  EKG: NSR 62 bpn PR 206 ms.   ASSESSMENT & PLAN: 1. H/O metabolic encephalopathy: - Needs outpatient sleep study.  - Continue BiPAP qHS 2.  Chronic Diastolic CHF:  TEE 3/90 showed EF 55-60% with mildly dilated/mildly dysfunctional RV.  NYHA III.Volume status stable. Continue torsemide 60 mg twice a day.  Reinforced limiting fluid intake to < 2 liters per day and low salt food choices.  Check BMET and BNP.  3. PAF:  -S/P DC-CV 04/2018.  -Maintaining NSR.  - Cut bacm amiodarone to 100 mg daily. - Continue eliquis 5 mg twice a day.  -Next visit check TSH, LFTs.  4. CKD stage 3:  -Check BMET  5. CAD: S/p LIMA-LAD in 2009.  - No s/s ischemia.  - No ASA given Eliquis use.   Continue rosuvastatin 20 mg 6.  HTN  'Continue current regimen.  7. Obesity Body mass index is 52.86 kg/m.  Discussed portion control.     Follow up in 3 months. Greater than 50% of the (total minutes 25) visit spent in counseling/coordination of care regarding the above.     Terris Bodin NP-C  11:51 AM

## 2018-07-26 ENCOUNTER — Other Ambulatory Visit (HOSPITAL_COMMUNITY): Payer: Self-pay | Admitting: Adult Health

## 2018-08-07 ENCOUNTER — Telehealth (HOSPITAL_COMMUNITY): Payer: Self-pay | Admitting: Licensed Clinical Social Worker

## 2018-08-07 NOTE — Telephone Encounter (Signed)
CSW received call from patients family member requesting information on other PCP options.  CSW provided multiple numbers to local offices and encouraged them to call to confirm they are in network with his insurance and to schedule an initial appointment.  [place], LCSW Clinical Social Worker [facility] [phone]

## 2018-08-19 ENCOUNTER — Encounter: Payer: Self-pay | Admitting: Registered Nurse

## 2018-08-19 ENCOUNTER — Other Ambulatory Visit: Payer: Self-pay

## 2018-08-19 ENCOUNTER — Encounter: Payer: Medicare PPO | Attending: Physical Medicine & Rehabilitation | Admitting: Registered Nurse

## 2018-08-19 VITALS — BP 151/78 | HR 71 | Ht 70.0 in | Wt 374.0 lb

## 2018-08-19 DIAGNOSIS — I2510 Atherosclerotic heart disease of native coronary artery without angina pectoris: Secondary | ICD-10-CM | POA: Insufficient documentation

## 2018-08-19 DIAGNOSIS — I252 Old myocardial infarction: Secondary | ICD-10-CM | POA: Insufficient documentation

## 2018-08-19 DIAGNOSIS — G629 Polyneuropathy, unspecified: Secondary | ICD-10-CM | POA: Insufficient documentation

## 2018-08-19 DIAGNOSIS — D333 Benign neoplasm of cranial nerves: Secondary | ICD-10-CM

## 2018-08-19 DIAGNOSIS — G510 Bell's palsy: Secondary | ICD-10-CM | POA: Diagnosis not present

## 2018-08-19 DIAGNOSIS — Z87442 Personal history of urinary calculi: Secondary | ICD-10-CM | POA: Diagnosis not present

## 2018-08-19 DIAGNOSIS — F17210 Nicotine dependence, cigarettes, uncomplicated: Secondary | ICD-10-CM | POA: Insufficient documentation

## 2018-08-19 DIAGNOSIS — Z8249 Family history of ischemic heart disease and other diseases of the circulatory system: Secondary | ICD-10-CM | POA: Diagnosis not present

## 2018-08-19 DIAGNOSIS — Z951 Presence of aortocoronary bypass graft: Secondary | ICD-10-CM | POA: Insufficient documentation

## 2018-08-19 DIAGNOSIS — G609 Hereditary and idiopathic neuropathy, unspecified: Secondary | ICD-10-CM | POA: Diagnosis not present

## 2018-08-19 DIAGNOSIS — Z930 Tracheostomy status: Secondary | ICD-10-CM | POA: Insufficient documentation

## 2018-08-19 DIAGNOSIS — G8929 Other chronic pain: Secondary | ICD-10-CM | POA: Diagnosis not present

## 2018-08-19 DIAGNOSIS — M62838 Other muscle spasm: Secondary | ICD-10-CM | POA: Diagnosis not present

## 2018-08-19 DIAGNOSIS — Z9889 Other specified postprocedural states: Secondary | ICD-10-CM | POA: Diagnosis not present

## 2018-08-19 DIAGNOSIS — Z5181 Encounter for therapeutic drug level monitoring: Secondary | ICD-10-CM

## 2018-08-19 DIAGNOSIS — M797 Fibromyalgia: Secondary | ICD-10-CM | POA: Insufficient documentation

## 2018-08-19 DIAGNOSIS — R51 Headache: Secondary | ICD-10-CM | POA: Insufficient documentation

## 2018-08-19 DIAGNOSIS — Z760 Encounter for issue of repeat prescription: Secondary | ICD-10-CM | POA: Diagnosis not present

## 2018-08-19 DIAGNOSIS — E119 Type 2 diabetes mellitus without complications: Secondary | ICD-10-CM | POA: Insufficient documentation

## 2018-08-19 DIAGNOSIS — Z79891 Long term (current) use of opiate analgesic: Secondary | ICD-10-CM

## 2018-08-19 DIAGNOSIS — I509 Heart failure, unspecified: Secondary | ICD-10-CM | POA: Insufficient documentation

## 2018-08-19 DIAGNOSIS — Z8673 Personal history of transient ischemic attack (TIA), and cerebral infarction without residual deficits: Secondary | ICD-10-CM | POA: Diagnosis not present

## 2018-08-19 DIAGNOSIS — I110 Hypertensive heart disease with heart failure: Secondary | ICD-10-CM | POA: Diagnosis not present

## 2018-08-19 DIAGNOSIS — R600 Localized edema: Secondary | ICD-10-CM | POA: Insufficient documentation

## 2018-08-19 DIAGNOSIS — I255 Ischemic cardiomyopathy: Secondary | ICD-10-CM | POA: Diagnosis not present

## 2018-08-19 DIAGNOSIS — M109 Gout, unspecified: Secondary | ICD-10-CM | POA: Insufficient documentation

## 2018-08-19 DIAGNOSIS — Z809 Family history of malignant neoplasm, unspecified: Secondary | ICD-10-CM | POA: Insufficient documentation

## 2018-08-19 DIAGNOSIS — G894 Chronic pain syndrome: Secondary | ICD-10-CM

## 2018-08-19 MED ORDER — HYDROCODONE-ACETAMINOPHEN 10-325 MG PO TABS: ORAL_TABLET | ORAL | 0 refills | Status: DC

## 2018-08-19 NOTE — Progress Notes (Signed)
Subjective:    Patient ID: Frank Pat., male    DOB: 07-25-1961, 57 y.o.   MRN: [id number]  HPI: [name]. is a 57 y.o. male who returns for follow up appointment for chronic pain and medication refill.He states his pain is located in his right eye, right temporal and right facial tenderness. He rates his pain 7. His  current exercise regime is walking.   Mr. [place] Morphine equivalent is  40.00MME.  Last UDS was Performed on 05/31/2018, it was consistent.   Pain Inventory Average Pain 7 Pain Right Now 7 My pain is burning and tingling  In the last 24 hours, has pain interfered with the following? General activity 6 Relation with others 0 Enjoyment of life 0 What TIME of day is your pain at its worst? daytime Sleep (in general) Fair  Pain is worse with: standing Pain improves with: rest Relief from Meds: 5  Mobility walk with assistance use a walker how many minutes can you walk? 10 ability to climb steps?  no do you drive?  no  Function disabled: date disabled n/a I need assistance with the following:  dressing and bathing  Neuro/Psych numbness  Prior Studies Any changes since last visit?  no  Physicians involved in your care Any changes since last visit?  no   Family History  Problem Relation Age of Onset  . CAD Father   . Hypertension Mother   . Cancer Other        multiple relatives   Social History   Socioeconomic History  . Marital status: Married    Spouse name: Not on file  . Number of children: Not on file  . Years of education: Not on file  . Highest education level: Not on file  Occupational History  . Not on file  Social Needs  . Financial resource strain: Not on file  . Food insecurity:    Worry: Not on file    Inability: Not on file  . Transportation needs:    Medical: Not on file    Non-medical: Not on file  Tobacco Use  . Smoking status: Former Smoker    Packs/day: 0.25    Years: 20.00    Pack years:  5.00    Types: Cigarettes  . Smokeless tobacco: Current User  . Tobacco comment: Rare tobacco.  "[name]".  Substance and Sexual Activity  . Alcohol use: No    Alcohol/week: 0.0 standard drinks  . Drug use: No  . Sexual activity: Yes  Lifestyle  . Physical activity:    Days per week: Not on file    Minutes per session: Not on file  . Stress: Not on file  Relationships  . Social connections:    Talks on phone: Not on file    Gets together: Not on file    Attends religious service: Not on file    Active member of club or organization: Not on file    Attends meetings of clubs or organizations: Not on file    Relationship status: Not on file  Other Topics Concern  . Not on file  Social History Narrative  . Not on file   Past Surgical History:  Procedure Laterality Date  . 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 ANAL FISTULECTOMY    . CARDIOVERSION N/A 05/08/2018  Procedure: CARDIOVERSION;  Surgeon: [name], MD;  [facility] ENDOSCOPY;  Service: Cardiovascular;  Laterality: N/A;  . CORONARY ARTERY BYPASS GRAFT     LIMA to LAD 2009  . CRANIOTOMY Right 11/20/2012   Procedure: CRANIOTOMY REPAIR DURAL/CENTRAL SPINAL FLUID LEAK;  Surgeon: [name], MD;  Location: [place] NEURO ORS;  Service: Neurosurgery;  Laterality: Right;  . EYE SURGERY     to coorect lid and double vision  . MEDIAN RECTUS REPAIR Right 02/16/2016   Procedure: RIGHT LATERAL RECTUS RESECTION; SUPERIOR RECTUS RESECTION RIGHT EYE; RIGHT SUPERIOR RECTUS RECESSION; LATERAL TARSAL STRIP RIGHT LOWER EYELID;  Surgeon: [name], MD;  Location: [place];  Service: Ophthalmology;  Laterality: Right;  . PLACEMENT OF LUMBAR DRAIN N/A 11/20/2012   Procedure: Attempted PLACEMENT OF LUMBAR DRAIN;  Surgeon: [name], MD;  Location: [place] NEURO ORS;  Service: Neurosurgery;   Laterality: N/A;  . RETROSIGMOID CRANIECTOMY FOR TUMOR RESECTION Right 10/11/2012   Procedure: RETROSIGMOID CRANIECTOMY FOR TUMOR RESECTION;  Surgeon: [name], MD;  Location: [place] NEURO ORS;  Service: Neurosurgery;  Laterality: Right;  Craniotomy for acoustic neuroma  . TEE WITHOUT CARDIOVERSION N/A 05/08/2018   Procedure: TRANSESOPHAGEAL ECHOCARDIOGRAM (TEE);  Surgeon: [name], MD;  Location: [facility] ENDOSCOPY;  Service: Cardiovascular;  Laterality: N/A;  . TRACHEOSTOMY TUBE PLACEMENT N/A 10/11/2012   Procedure: TRACHEOSTOMY;  Surgeon: Ascencion Dike, MD;  Location: [facility];  Service: ENT;  Laterality: N/A;  . UMBILICAL HERNIA REPAIR     Past Medical History:  Diagnosis Date  . Brain tumor ([place])    Takes Topamax so patient will not have headaches  . Bruises easily   . CAD (coronary artery disease)    Cath 09, 99% LAD  . Cardiomyopathy, ischemic    Ischemic  . CHF (congestive heart failure) ([place])   . Cyst near coccyx   . Cyst near tailbone   . Diabetes mellitus without complication (HCC)    borderline  . Edema of both legs    Takes Lasix  . Fibromyalgia   . Gout   . Headache(784.0)    with lights  . Hypertension    dr Percival Spanish  . Kidney stones   . Myocardial infarction ([place])   . Obesity   . Pneumonia    hx of  . Shortness of breath   . Stroke [facility])    Affected vision, walking, and facial drooping on right face   BP (!) 151/78   Pulse 71   Ht 5\' 10"  (1.778 m)   Wt (!) 374 lb (169.6 kg)   SpO2 93%   BMI 53.66 kg/m   Opioid Risk Score:   Fall Risk Score:  `1  Depression screen PHQ 2/9  Depression screen [facility] 2/9 08/19/2018 05/31/2018 11/09/2017 09/10/2017 07/16/2017 05/14/2017 02/15/2015  Decreased Interest 1 1 1 1  0 0 0  Down, Depressed, Hopeless 1 1 1 1  0 0 0  PHQ - 2 Score 2 2 2 2  0 0 0  Altered sleeping - - - - - - 0  Tired, decreased energy - - - - - - 0  Change in appetite - - - - - - 0  Feeling bad or failure about yourself  - - - - - - 0  Trouble  concentrating - - - - - - 0  Moving slowly or fidgety/restless - - - - - - 0  Suicidal thoughts - - - - - - 0  PHQ-9 Score - - - - - -  0    Review of Systems  Constitutional: Negative.   HENT: Negative.   Eyes: Negative.   Respiratory: Negative.   Cardiovascular: Negative.   Gastrointestinal: Positive for abdominal pain and constipation.  Endocrine: Negative.   Genitourinary: Negative.   Musculoskeletal: Negative.   Skin: Negative.   Allergic/Immunologic: Negative.   Neurological: Negative.   Hematological: Negative.   Psychiatric/Behavioral: Negative.   All other systems reviewed and are negative.      Objective:   Physical Exam Vitals signs and nursing note reviewed.  Constitutional:      Appearance: Normal appearance. He is obese.  Neck:     Musculoskeletal: Normal range of motion and neck supple.  Cardiovascular:     Rate and Rhythm: Normal rate and regular rhythm.     Pulses: Normal pulses.     Heart sounds: Normal heart sounds.  Pulmonary:     Effort: Pulmonary effort is normal.     Breath sounds: Normal breath sounds.  Musculoskeletal:     Left lower leg: Edema present.     Comments: Normal Muscle Bulk and Muscle Testing Reveals:  Upper Extremities: Full ROM and Muscle Strength 5/5 Lower Extremities: Full ROM and Muscle Strength 5/5 Arises from Table Slowly using walker for support Narrow Based Gait   Skin:    General: Skin is warm and dry.  Neurological:     Mental Status: He is alert and oriented to person, place, and time.  Psychiatric:        Mood and Affect: Mood normal.        Behavior: Behavior normal.           Assessment & Plan:  1. 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 S/P RIGHT LATERAL RECTUS RESECTION; SUPERIOR RECTUS RESECTION RIGHT EYE; RIGHT SUPERIOR RECTUS RECESSION; LATERAL TARSAL STRIP RIGHT LOWER EYELID with Dr. Frederico Hamman on 02/16/16. Refilled:  Hydrocodone 10/325mg  one tablet every 6 hours as needed #120. Second scripte-scribefor the following month. 12//30/2019 We will continue the opioid monitoring program, this consists of regular clinic visits, examinations, urine drug screen, pill counts as well as use of [place] Controlled Substance Reporting System. Continue eye ointment as ordered. 2. Headaches right temporal area related to tumor/surgery.No complaints today.Continue Topamax. 08/19/2018 3. Lower extremity edema, morbid obesity. Continue to Monitor. PCP Following. 08/19/2018 4. Muscle Spasm: Continue Tizanidine. 08/19/2018 5. Peripheral Neuropathy:Continue Topamax. Continue to Monitor. 08/19/2018  20 minutes of face to face patient care time was spent during this visit. All questions were encouraged and answered.  F/U in 2 months

## 2018-09-13 ENCOUNTER — Ambulatory Visit: Payer: Medicare PPO | Admitting: Family Medicine

## 2018-09-13 ENCOUNTER — Encounter: Payer: Self-pay | Admitting: Family Medicine

## 2018-09-13 ENCOUNTER — Ambulatory Visit (INDEPENDENT_AMBULATORY_CARE_PROVIDER_SITE_OTHER): Payer: HMO | Admitting: Family Medicine

## 2018-09-13 VITALS — BP 160/70 | HR 95 | Temp 98.2°F | Ht 70.0 in | Wt 377.8 lb

## 2018-09-13 DIAGNOSIS — E785 Hyperlipidemia, unspecified: Secondary | ICD-10-CM

## 2018-09-13 DIAGNOSIS — E1165 Type 2 diabetes mellitus with hyperglycemia: Secondary | ICD-10-CM | POA: Diagnosis not present

## 2018-09-13 DIAGNOSIS — E1151 Type 2 diabetes mellitus with diabetic peripheral angiopathy without gangrene: Secondary | ICD-10-CM | POA: Diagnosis not present

## 2018-09-13 DIAGNOSIS — N183 Chronic kidney disease, stage 3 unspecified: Secondary | ICD-10-CM

## 2018-09-13 DIAGNOSIS — L8990 Pressure ulcer of unspecified site, unspecified stage: Secondary | ICD-10-CM | POA: Diagnosis not present

## 2018-09-13 DIAGNOSIS — E1169 Type 2 diabetes mellitus with other specified complication: Secondary | ICD-10-CM

## 2018-09-13 DIAGNOSIS — I5032 Chronic diastolic (congestive) heart failure: Secondary | ICD-10-CM | POA: Diagnosis not present

## 2018-09-13 DIAGNOSIS — IMO0002 Reserved for concepts with insufficient information to code with codable children: Secondary | ICD-10-CM

## 2018-09-13 MED ORDER — DUODERM HYDROACTIVE EX GEL: CUTANEOUS | 2 refills | Status: AC

## 2018-09-13 NOTE — Progress Notes (Signed)
[name]. DOB: Aug 24, 1960 Encounter date: 09/13/2018  This isa 58 y.o. male who presents to establish care. Chief Complaint  Patient presents with  . New Patient (Initial Visit)    bed sore from hospital stay last year has not gotten well    History of present illness: Has been following with Dr. Arelia Sneddon. Not clear about reason for transfer here.  Sees pain mgmt Dr. Tessa Lerner.   Developed diabetes last year: Has been watching what he is eating but getting some fluctuations in sugar. Frustrates him. Drinks tea (minimal sugar - 1tbs in gallon of tea). Last A1C in system was in September; not been checked since then.   Has heart specialist - follows with Dr. Aundra Dubin and with Darrick Grinder for this. Hx of CAD, CABG, diastolic HF, a fib, HTN. He has been cardioverted from a fib.   Head tumor surgery (acoustic neuroma resection)- had stroke during 20+ hour surgery. [place] because of this. 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   Has not been able to get bed sore healed. Has been there for 3 years and has tried antibiotics, triple antibiotic ointment. Just can't get rid of this; keeps coming back. Aggravates him, hurts him. Wants supply of dressings from our office that will be free for him.   Not good about staying hydrated. But does urinate well.   Last bloodwork was in rehab facility in October. States that he never did real exercises when in rehab and it took hours to get assistance with toileting. He was extremely frustrated and states he will never go back to similar facility.  Hypercarbia: thinks getting oxygen through heart doctor. Started on oxygen in hospital during eye surgery; wasn't able to wean off. Usually keeps between 2-3 L.   Poor historian in general. Difficult to establish timelines for medications and medical history.   Elevated serum creat noted from previous hospitalization. Patient denies having seen nephrology in past. Gets somewhat agitated stating that nothing is  wrong with kidneys and he doesn't need to see another doctor. Did have slight improvement in BUN/creat from 32/2.06 on 05/10/18 to 21/1.93 on 07/01/18.  Past Medical History:  Diagnosis Date  . Brain tumor ([place])    Takes Topamax so patient will not have headaches  . Bruises easily   . CAD (coronary artery disease)    Cath 09, 99% LAD  . Cardiomyopathy, ischemic    Ischemic  . CHF (congestive heart failure) ([place])   . Cyst near coccyx   . Cyst near tailbone   . Diabetes mellitus without complication (HCC)    borderline  . Edema of both legs    Takes Lasix  . Fibromyalgia   . Gout   . Headache(784.0)    with lights  . Hypertension    dr Percival Spanish  . Kidney stones   . Myocardial infarction ([place])   . Obesity   . Pneumonia    hx of  . Shortness of breath   . Stroke [facility])    Affected vision, walking, and facial drooping on right face   Past Surgical History:  Procedure Laterality Date  . 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 ANAL FISTULECTOMY    . CARDIOVERSION N/A 05/08/2018   Procedure: CARDIOVERSION;  Surgeon: [name], MD;  Location: [facility] ENDOSCOPY;  Service: Cardiovascular;  Laterality: N/A;  . CORONARY ARTERY BYPASS GRAFT  LIMA to LAD 2009  . CRANIOTOMY Right 11/20/2012   Procedure: CRANIOTOMY REPAIR DURAL/CENTRAL SPINAL FLUID LEAK;  Surgeon: [name], MD;  Location: [place] NEURO ORS;  Service: Neurosurgery;  Laterality: Right;  . EYE SURGERY     to coorect lid and double vision  . MEDIAN RECTUS REPAIR Right 02/16/2016   Procedure: RIGHT LATERAL RECTUS RESECTION; SUPERIOR RECTUS RESECTION RIGHT EYE; RIGHT SUPERIOR RECTUS RECESSION; LATERAL TARSAL STRIP RIGHT LOWER EYELID;  Surgeon: [name], MD;  Location: [place];  Service: Ophthalmology;  Laterality: Right;  . PLACEMENT OF LUMBAR DRAIN N/A 11/20/2012   Procedure: Attempted  PLACEMENT OF LUMBAR DRAIN;  Surgeon: [name], MD;  Location: [place] NEURO ORS;  Service: Neurosurgery;  Laterality: N/A;  . RETROSIGMOID CRANIECTOMY FOR TUMOR RESECTION Right 10/11/2012   Procedure: RETROSIGMOID CRANIECTOMY FOR TUMOR RESECTION;  Surgeon: [name], MD;  Location: [place] NEURO ORS;  Service: Neurosurgery;  Laterality: Right;  Craniotomy for acoustic neuroma  . TEE WITHOUT CARDIOVERSION N/A 05/08/2018   Procedure: TRANSESOPHAGEAL ECHOCARDIOGRAM (TEE);  Surgeon: [name], MD;  Location: [facility] ENDOSCOPY;  Service: Cardiovascular;  Laterality: N/A;  . TRACHEOSTOMY TUBE PLACEMENT N/A 10/11/2012   Procedure: TRACHEOSTOMY;  Surgeon: Ascencion Dike, MD;  Location: [facility];  Service: ENT;  Laterality: N/A;  . UMBILICAL HERNIA REPAIR     Allergies  Allergen Reactions  . Morphine And Related Anaphylaxis    "makes me stop breathing"  . Novocain [Procaine] Anaphylaxis  . Carbamazepine     shaking  . Gabapentin Other (See Comments)    Blood in urine  . Ivp Dye [Iodinated Diagnostic Agents]     Passing out  . Other     Steroids makes hearts race   Current Meds  Medication Sig  . amiodarone (PACERONE) 100 MG tablet Take 1 tablet (100 mg total) by mouth daily.  Marland Kitchen apixaban (ELIQUIS) 5 MG TABS tablet Take 1 tablet (5 mg total) by mouth 2 (two) times daily.  Marland Kitchen artificial tears (LACRILUBE) OINT ophthalmic ointment Place 1 application into both eyes at bedtime as needed for dry eyes.   . carvedilol (COREG) 12.5 MG tablet Take 1 tablet (12.5 mg total) by mouth 2 (two) times daily with a meal.  . HYDROcodone-acetaminophen (NORCO) 10-325 MG tablet TAKE 1 TABLET BY MOUTH EVERY 6 HOURS AS NEEDED FOR PAIN MUST LAST 30 DAYS  . lidocaine (LIDODERM) 5 % Place 1 patch onto the skin daily. Remove & Discard patch within 12 hours or as directed by MD  . nystatin (MYCOSTATIN/NYSTOP) powder APPLY  1  GRAM TOPICALLY TWICE DAILY AS NEEDED  . potassium chloride SA (K-DUR,KLOR-CON) 20 MEQ tablet Take  20 mEq by mouth daily.  . sucralfate (CARAFATE) 1 g tablet Take 1 tablet (1 g total) by mouth 4 (four) times daily -  with meals and at bedtime.  Marland Kitchen tiZANidine (ZANAFLEX) 4 MG tablet 1 tablet four times daily as needed for muscle spasms  . topiramate (TOPAMAX) 100 MG tablet Take 1 tablet (100 mg total) by mouth at bedtime.  . torsemide (DEMADEX) 20 MG tablet TAKE 3 TABLETS (60 MG TOTAL) BY MOUTH 2 (TWO) TIMES DAILY.  . [DISCONTINUED] colchicine 0.6 MG tablet Take 1 tablet (0.6 mg total) by mouth daily.   Social History   Tobacco Use  . Smoking status: Former Smoker    Packs/day: 0.25    Years: 20.00    Pack years: 5.00    Types: Cigarettes  . Smokeless tobacco: Current User  .  Tobacco comment: Rare tobacco.  "[name]".  Substance Use Topics  . Alcohol use: No    Alcohol/week: 0.0 standard drinks   Family History  Problem Relation Age of Onset  . CAD Father   . Hypertension Mother   . Cancer Other        multiple relatives  . Diabetes Maternal Grandmother   . Diabetes Maternal Aunt   . Leukemia Cousin   . Leukemia Maternal Grandfather      Review of Systems  Constitutional: Negative for chills, fatigue and fever. Activity change: states that he is up and down throughout his day and getting around house.   Respiratory: Negative for cough, chest tightness, shortness of breath and wheezing.   Cardiovascular: Negative for chest pain, palpitations and leg swelling.    Objective:  BP (!) 160/70 (BP Location: Left Arm, Patient Position: Sitting, Cuff Size: Large)   Pulse 95   Temp 98.2 F (36.8 C) (Oral)   Ht 5\' 10"  (1.778 m)   Wt (!) 377 lb 12.8 oz (171.4 kg)   SpO2 (!) 85%   BMI 54.21 kg/m   Weight: (!) 377 lb 12.8 oz (171.4 kg)   BP Readings from Last 3 Encounters:  09/13/18 (!) 160/70  08/19/18 (!) 151/78  07/01/18 140/90   Wt Readings from Last 3 Encounters:  09/13/18 (!) 377 lb 12.8 oz (171.4 kg)  08/19/18 (!) 374 lb (169.6 kg)  07/01/18 (!) 368 lb  6.4 oz (167.1 kg)    Physical Exam Constitutional:      General: He is not in acute distress.    Appearance: He is well-developed. He is morbidly obese.     Comments: Poorly groomed, disheveled   Cardiovascular:     Rate and Rhythm: Normal rate and regular rhythm.     Heart sounds: Heart sounds are distant. Murmur present. Systolic murmur present with a grade of 2/6. No friction rub.     Comments: There is hyperpigmentation bilat LE more prominent on left LE.  Pulmonary:     Effort: Pulmonary effort is normal. No respiratory distress.     Breath sounds: Normal breath sounds. Decreased air movement (slightly) present. No wheezing, rhonchi or rales.  Musculoskeletal:     Right lower leg: 2+ Edema present.     Left lower leg: 2+ Edema present.  Skin:         Comments: There is impregnated dressing (uncertain type) present on bottom. This was a single dressing that they had from previous hostpialization. Wound below is healing well. There is no erythema to skin. There is 36mm ciruclar open lesion with minimal drainage. There is some slight eperbole appearance to wound edges. No tenderness to palpation of wound. No palpable underlying skin abnormality.   Neurological:     Mental Status: He is alert and oriented to person, place, and time.     Cranial Nerves: Facial asymmetry present.     Comments: In the office he has difficulty with getting up from chair, walking around room, even standing up for a minute to have pressure sore evaluated.  Psychiatric:        Attention and Perception: Attention normal.        Mood and Affect: Affect is angry.        Speech: Speech normal.        Behavior: Behavior is agitated.     Assessment/Plan: 1. Pressure injury of skin, unspecified injury stage, unspecified location I did look at wound with  his wife who put current dressing on. Wound appears to be healing and per her this is best it has looked. Prior to current dressing (placed 2-3 days ago) they  were putting gauze dressings with antibiotic ointment. I have printed rx for duoderm which I think will hep facilitate continued healing of this wound. If not healing well or worsening could certainly consider wound care to come to his home to dressing changes. Cost of dressings is concern for him and I am not sure what cost of duoderm will be or what insurance will cover.  2. DM (diabetes mellitus) type II uncontrolled, periph vascular disorder (HCC) No recent bloodwork. Poor control based on last A1C in October (over 10). Will repeat bloodwork and will need to return for chronic condition visit. - Hemoglobin A1c; Future - TSH; Future  3. Hyperlipidemia associated with type 2 diabetes mellitus ([place]) See above.  - CMP; Future - Lipid panel; Future - TSH; Future  4. CKD (chronic kidney disease) stage 3, GFR 30-59 ml/min (HCC) Will recheck bloodwork for stability. I do think reasonable for nephrology input given CKD and diabetes. Will discuss pending results.  - Microalbumin/Creatinine Ratio, Urine  5. Chronic diastolic HF: follows with cardiology for CAD, CABG hx, paroxysmal a fib. On eliquis. We did attempt to review medications at visit today; but I am not certain he is taking everything as prescribed. He had stopped multiple medications from previous hospital discharge. We will ask him to bring in medications to next visit so we can make sure we are on the same page.   Return pending bloodwork; 1 month chronic condition.  Micheline Rough, MD

## 2018-09-14 DIAGNOSIS — E1122 Type 2 diabetes mellitus with diabetic chronic kidney disease: Secondary | ICD-10-CM | POA: Insufficient documentation

## 2018-09-14 DIAGNOSIS — E1165 Type 2 diabetes mellitus with hyperglycemia: Secondary | ICD-10-CM

## 2018-09-14 DIAGNOSIS — N183 Chronic kidney disease, stage 3 unspecified: Secondary | ICD-10-CM | POA: Insufficient documentation

## 2018-09-14 DIAGNOSIS — E1169 Type 2 diabetes mellitus with other specified complication: Secondary | ICD-10-CM | POA: Insufficient documentation

## 2018-09-14 DIAGNOSIS — E1151 Type 2 diabetes mellitus with diabetic peripheral angiopathy without gangrene: Secondary | ICD-10-CM

## 2018-09-16 LAB — COMPREHENSIVE METABOLIC PANEL

## 2018-09-16 LAB — HEMOGLOBIN A1C

## 2018-09-16 LAB — TSH

## 2018-09-16 LAB — MICROALBUMIN / CREATININE URINE RATIO
CREATININE, URINE: 95 mg/dL (ref 20–320)
Microalb Creat Ratio: 103 mcg/mg creat — ABNORMAL HIGH (ref <30)
Microalb, Ur: 9.8 mg/dL

## 2018-09-16 LAB — LIPID PANEL

## 2018-09-21 ENCOUNTER — Other Ambulatory Visit (HOSPITAL_COMMUNITY): Payer: Self-pay | Admitting: Adult Health

## 2018-09-23 ENCOUNTER — Other Ambulatory Visit: Payer: Self-pay

## 2018-09-23 ENCOUNTER — Telehealth: Payer: Self-pay | Admitting: *Deleted

## 2018-09-23 DIAGNOSIS — D333 Benign neoplasm of cranial nerves: Secondary | ICD-10-CM

## 2018-09-23 DIAGNOSIS — G894 Chronic pain syndrome: Secondary | ICD-10-CM

## 2018-09-23 DIAGNOSIS — Z9889 Other specified postprocedural states: Secondary | ICD-10-CM

## 2018-09-23 DIAGNOSIS — G510 Bell's palsy: Secondary | ICD-10-CM

## 2018-09-23 MED ORDER — HYDROCODONE-ACETAMINOPHEN 10-325 MG PO TABS: ORAL_TABLET | ORAL | 0 refills | Status: DC

## 2018-09-23 NOTE — Telephone Encounter (Signed)
Hydrocodone e-scribed to [facility]. Mr. Manuele is aware of the above.

## 2018-09-23 NOTE — Telephone Encounter (Signed)
Patient's pharmacy, [facility], called and stated that they are unable to fill patients hydrocodone-acetaminophen 10-325mg  due to back order. I located medication in stock at [facility].  [facility] and [facility] and  sent this message to Danella Sensing for review and fill.

## 2018-09-23 NOTE — Patient Outreach (Addendum)
[place] [facility]) Care Management Chronic Special Needs Program  09/23/2018  Name: [name]. DOB: 1961-06-03  MRN: [id number]  [name] is enrolled in a chronic special needs plan for Diabetes. Chronic Care Management Coordinator telephoned client to review health risk assessment and to develop individualized care plan.  Introduced the chronic care management program, importance of client participation, and taking their care plan to all provider appointments and inpatient facilities.  Reviewed the transition of care process and possible referral to community care management.  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 Client reports his pain is managed by the pain clinic and denies pain at this time. Client states he has a wound on his buttocks that has been there for approximately 2 years. He states he has been seen by his primary care recently and this is being address. Client also reports he is active with the advanced heart failure clinic. [name] states he does not weigh self due to his scale is too small. He states he monitors his intake and output and judges fluid volume based on this.   [name] states his wife manages his medications and states no difficulties with affording medications at this time, but states in about three months he will go in the the "donut hole" and have to pay full amount for his medications. He also states he does not have enough strips and limits how often he checks his blood sugar because he does not want to run out of strips.  [name] acknowledges feeling overwhelmed, but states it is due to his diabetes diagnosis and would like more information regarding management of his diabetes. He denies the need for additional education regarding heart failure.     Goals Addressed            This Visit's Progress   . Client understands the importance of follow-up with providers by attending scheduled visits      . Client will not report change from baseline and no repeated symptoms of stroke with in the next 9 months       . Client will report abillity to obtain Medications within 6 months.      . Client will report no worsening of symptoms of Atrial Fibrillation within the next 6 months      . Client will report no worsening of symptoms related to heart disease within the next 9 months       . Client will use Assistive Devices as needed and verbalize understanding of device use      . Client will verbalize knowledge of chronic lung disease as evidenced by no ED visits or Inpatient stays related to chronic lung disease       . Client will verbalize knowledge of self management of Hypertension as evidences by BP reading of 140/90 or less; or as defined by provider      . Client will verbalize understanding of treatment plan for impaired skin integrity and follow up with provider as scheduled within the next 3 months.      Marland Kitchen HEMOGLOBIN A1C < 7.0      . Maintain timely refills of diabetic medication as prescribed within the year .      Marland Kitchen Obtain annual  Lipid Profile, LDL-C      . Obtain Annual Eye (retinal)  Exam       .  Obtain Annual Foot Exam      . Obtain annual screen for micro albuminuria (urine) , nephropathy (kidney problems)      . Obtain Hemoglobin A1C at least 2 times per year      . Visit Primary Care Provider or Endocrinologist at least 2 times per year          Plan:  Send successful outreach letter with a copy of their individualized care plan, Send individual care plan to provider and Send educational material  Chronic care management coordination will outreach in:  3 Months  Will refer client to:  Pharmacy   Addendum: Pharmacy referral for:  Client reports he is taking his blood thinner only once a day instead of twice a day as  prescribed; polypharmacy; reports he will have to pay full amount in about three months. He also states he does not have enough strips and limits how often he checks his blood sugar because he does not want to run out of strips.   Thea Silversmith, RN, MSN, [place] [phone]

## 2018-09-24 ENCOUNTER — Other Ambulatory Visit: Payer: Self-pay

## 2018-09-24 NOTE — Patient Outreach (Signed)
[place] [facility]) Care Management  [place]  09/24/2018  Perle Gibbon. [birth year] [id number]  Reason for call: medication assistance  Unsuccessful telephone call attempt # 1 to patient.   Unable to leave message.  Voice mail did not pick up.  Plan:  I will make another outreach attempt to patient within 3-4 business days.  Will send unsuccessful outreach letter.   Joetta Manners, PharmD Clinical Pharmacist [place] [phone]

## 2018-09-25 ENCOUNTER — Other Ambulatory Visit: Payer: Self-pay

## 2018-09-25 DIAGNOSIS — I5042 Chronic combined systolic (congestive) and diastolic (congestive) heart failure: Secondary | ICD-10-CM | POA: Diagnosis not present

## 2018-09-25 NOTE — Patient Outreach (Signed)
  [facility]) Care Management Chronic Special Needs Program  09/25/2018  Name: [name]. DOB: [birth year]  MRN: [id number]  [name] is enrolled in a chronic special needs plan for Diabetes. Reviewed and updated care plan.  Goals Addressed            This Visit's Progress   . HEMOGLOBIN A1C < 8.0       Your Hemoglobin A1C goal set by your primary care doctor is less than 8.0. You are due to have your A1C level drawn. Please call to reschedule your A1C to be drawn as soon as possible so that your primary care can best help you manage your diabetes.       Plan:  Send individual care plan to provider and Send educational material to client; send updated care plan to client; Chronic care management coordinator will outreach as previously scheduled    Thea Silversmith, RN, MSN, [place] [phone]   .

## 2018-09-25 NOTE — Patient Outreach (Signed)
  [place] [facility]) Care Management Chronic Special Needs Program   09/25/2018  Name: [name]., DOB: 12/30/1960  MRN: [id number]  The client was discussed in today's interdisciplinary care team meeting.  The following issues were discussed:  Client's needs, Key risk triggers/risk stratification, Care Plan, Coordination of care and Issues/barriers to care  Participants present: Mahlon Gammon, MSN, RN, CCM, CNS; Thea Silversmith, MSN, RN, CCM; Peter Garter RN,BSN,CCM, CDE; Dr. Marco Collie   Recommendations:  Nutrition/Dietician referral(client agreable/per primary care recommendation; collaboration with Advanced Heart failure clinic-Follow up regarding scales.  Plan:  Follow up with client within the next month.  Thea Silversmith, RN, MSN, [place] [phone]

## 2018-09-27 ENCOUNTER — Ambulatory Visit: Payer: Self-pay

## 2018-09-30 DIAGNOSIS — I5042 Chronic combined systolic (congestive) and diastolic (congestive) heart failure: Secondary | ICD-10-CM | POA: Diagnosis not present

## 2018-10-04 ENCOUNTER — Other Ambulatory Visit: Payer: Self-pay

## 2018-10-04 NOTE — Patient Outreach (Signed)
[facility]) Care Management  10/04/2018  [name]. 10/16/1960 [id number]  Successful outreach call to [name].  HIPAA identifiers verified.   Medication Assistance: [name] was not able to review his medications with me.  He states his wife has his list and that she is at work.  He requested that I call her at 4:30 pm on Monday.  He was not able to tell me how much he is paying for prescriptions or if he is getting them trough [facility].  He did state that he believes he will be in the donut hole by March.  Plan: Will outreach to his wife Monday at 4:30pm.  Joetta Manners, [place] [phone]

## 2018-10-07 ENCOUNTER — Other Ambulatory Visit: Payer: Self-pay

## 2018-10-07 ENCOUNTER — Ambulatory Visit: Payer: Self-pay

## 2018-10-07 NOTE — Patient Outreach (Signed)
[place] [facility]) Care Management  [place]   10/07/2018  [name]. 28-Jan-1961 [id number]  Reason for referral: Medication Assistance with Lantus and Eliquis  Referral source: [facility] RN Current insurance:[employer]  PMHx includes but not limited to:  CAD of bypass graft, atrial fibrillation, diastolic heart failure, type 2 diabetes mellitus, facial nerve palsy and CKD Stage 3  Outreach:  Successful telephone call with [name].  HIPAA identifiers verified.   Subjective:  [name] report that they cannot afford his Lantus and Eliquis once he goes into the coverage gap, which they believe will be soon.  He states that he has asked his PCP for Lantus samples, but they don't have any.  He reports that he has a cardiology appointment tomorrow and will ask the office for samples.   Objective: Lab Results  Component Value Date   CREATININE 1.93 (H) 07/01/2018   CREATININE 1.87 (H) 05/20/2018   CREATININE 2.06 (H) 05/10/2018    Lab Results  Component Value Date   HGBA1C CANCELED 09/13/2018    Lipid Panel     Component Value Date/Time   CHOL 171 05/06/2018 0636   TRIG 156 (H) 05/06/2018 0636   HDL 27 (L) 05/06/2018 0636   CHOLHDL 6.3 05/06/2018 0636   VLDL 31 05/06/2018 0636   LDLCALC CANCELED 09/13/2018 1702    BP Readings from Last 3 Encounters:  09/13/18 (!) 160/70  08/19/18 (!) 151/78  07/01/18 140/90    Allergies  Allergen Reactions  . Morphine And Related Anaphylaxis    "makes me stop breathing"  . Novocain [Procaine] Anaphylaxis  . Carbamazepine     shaking  . Gabapentin Other (See Comments)    Blood in urine  . Ivp Dye [Iodinated Diagnostic Agents]     Passing out  . Other     Steroids makes hearts race    Medications Reviewed Today    Reviewed by Dionne Milo, [facility] (Pharmacist) on 10/07/18 at [place] List Status: <None>  Medication Order Taking? Sig Documenting Provider  Last Dose Status Informant  amiodarone (PACERONE) 100 MG tablet [id number] Yes Take 1 tablet (100 mg total) by mouth daily. [name] D, [name] Taking Active   amiodarone (PACERONE) 200 MG tablet [id number] No TAKE 1 TABLET (200 MG TOTAL) BY MOUTH 2 TIMES DAILY FOR 7 DAYS, THEN 1 TABLET (200 MG TOTAL) DAILY. [name] D, [name] Unknown Active   apixaban (ELIQUIS) 5 MG TABS tablet [id number] Yes Take 1 tablet (5 mg total) by mouth 2 (two) times daily. [name] D, [name] Taking Active   artificial tears (LACRILUBE) OINT ophthalmic ointment [id number] Yes Place 1 application into both eyes at bedtime as needed for dry eyes.  [provider] Taking Active Spouse/Significant Other  carvedilol (COREG) 12.5 MG tablet [id number] Yes Take 1 tablet (12.5 mg total) by mouth 2 (two) times daily with a meal. Agyei, Caprice Kluver, MD Taking Active   Hydroactive Dressings (DUODERM HYDROACTIVE) GEL [id number] Yes duoderm or generic equivalent gel dressing. 4cmx4cm. Apply to wound q 3 days until healed. Caren Macadam, MD Taking Active   HYDROcodone-acetaminophen [facility]) 10-325 MG tablet [id number] Yes TAKE 1 TABLET BY MOUTH EVERY 6 HOURS AS NEEDED FOR PAIN MUST LAST 30 DAYS Bayard Hugger, [name] Taking Active   insulin glargine (LANTUS) 100 UNIT/ML injection [id number] Yes Inject 45 Units into the skin daily. 2 hours after eating supper [provider] Taking Active  isosorbide mononitrate (IMDUR) 30 MG 24 hr tablet [id number] Yes Take 30 mg by mouth daily. [provider] Taking Active Spouse/Significant Other  nystatin (MYCOSTATIN/NYSTOP) powder [id number] Yes APPLY  1  GRAM TOPICALLY TWICE DAILY AS NEEDED Meredith Staggers, MD Taking Active Spouse/Significant Other  potassium chloride SA (K-DUR,KLOR-CON) 20 MEQ tablet [id number] Yes Take 40 mEq by mouth 2 (two) times daily.  [provider] Taking Active   sucralfate (CARAFATE) 1 g tablet [id number] Yes Take 1 tablet (1 g total) by mouth 4 (four) times  daily -  with meals and at bedtime. Kelvin Cellar, MD Taking Active Spouse/Significant Other  tiZANidine (ZANAFLEX) 4 MG tablet [id number] Yes 1 tablet four times daily as needed for muscle spasms  Patient taking differently:  1 tablet four times daily as needed for muscle spasms He takes 1 in am, 1 at noon and 2 at bedtime.   Meredith Staggers, MD Taking Active Spouse/Significant Other  topiramate (TOPAMAX) 100 MG tablet [id number] Yes Take 1 tablet (100 mg total) by mouth at bedtime. Meredith Staggers, MD Taking Active Spouse/Significant Other  torsemide (DEMADEX) 20 MG tablet [id number] Yes TAKE 3 TABLETS (60 MG TOTAL) BY MOUTH 2 (TWO) TIMES DAILY. [name], MD Taking Active          Assessment:  Drugs sorted by system:  Neurologic/Psychologic: topiramate  Cardiovascular: amiodarone, apixaban,  Carvedilol, isosorbide mononitrate, potassium chloride, torsemide  Gastrointestinal: sucralfate  Endocrine: insulin glargine  Topical: artifical tears, duoderm dressing, nystatin powder  Pain: hydrocodone/APAP  Miscellaneous: tizanidine  Medication Review Findings:  . Amiodarone- has two prescription in EMR for different doses.    Medication Assistance Findings:  Medication assistance needs identified.   Extra Help:   [] Already receiving Full Extra Help  [] Already receiving Partial Extra Help  [] Eligible based on reported income and assets  [x] Not Eligible based on reported income and assets-  Wife still works  Patient Assistance Programs: 1) Lantus made by [employer] o Income requirement met: [x] Yes [] No [] Unknown o Out-of-pocket prescription expenditure met:    [] Yes [x] No  [] Unknown  [] Not applicable - Patient has not met application requirements to apply for this patient assistance program at this time.  Will need to spend ~$640 out-of-pocket on prescriptons to  be eligible.        2)  Eliquis made by [place] requirement met: [x] Yes  [] No  [] Unknown o Out-of-pocket prescription expenditure met:   [] Yes [x] No   [] Unknown [] Not applicable Patient has not met application requirements to apply for this patient assistance program at this time. Will need to spend ~$960 out-of-pocket on prescriptoins to be eligible.   Plan: Will outreach to Frank Grinder, [name], cardiology to clarify amiodarone dose tomorrow and see if office has Eliquis samples.   Joetta Manners, PharmD Clinical Pharmacist [place] [phone]  Addendum: Randa Ngo message received from Frank Grinder, [name].  Cardiology will address proper amiodarone dose with family and see if any office samples of Eliquis available at appointment 10/09/18.   Joetta Manners, PharmD Clinical Pharmacist [place] [phone]

## 2018-10-09 ENCOUNTER — Other Ambulatory Visit: Payer: Self-pay

## 2018-10-09 ENCOUNTER — Encounter: Payer: Self-pay | Admitting: Physical Medicine & Rehabilitation

## 2018-10-09 ENCOUNTER — Encounter: Payer: HMO | Attending: Physical Medicine & Rehabilitation | Admitting: Physical Medicine & Rehabilitation

## 2018-10-09 ENCOUNTER — Encounter (HOSPITAL_COMMUNITY): Payer: Self-pay

## 2018-10-09 ENCOUNTER — Ambulatory Visit (HOSPITAL_COMMUNITY)
Admission: RE | Admit: 2018-10-09 | Discharge: 2018-10-09 | Disposition: A | Discharge status: Home / Self Care | Payer: HMO | Source: Ambulatory Visit | Attending: Internal Medicine | Admitting: Internal Medicine

## 2018-10-09 ENCOUNTER — Encounter (HOSPITAL_COMMUNITY): Payer: Self-pay | Admitting: Cardiology

## 2018-10-09 ENCOUNTER — Telehealth (HOSPITAL_COMMUNITY): Payer: Self-pay | Admitting: Cardiology

## 2018-10-09 VITALS — BP 146/98 | HR 66 | Wt 383.4 lb

## 2018-10-09 DIAGNOSIS — R600 Localized edema: Secondary | ICD-10-CM | POA: Insufficient documentation

## 2018-10-09 DIAGNOSIS — I130 Hypertensive heart and chronic kidney disease with heart failure and stage 1 through stage 4 chronic kidney disease, or unspecified chronic kidney disease: Secondary | ICD-10-CM | POA: Insufficient documentation

## 2018-10-09 DIAGNOSIS — Z930 Tracheostomy status: Secondary | ICD-10-CM | POA: Diagnosis not present

## 2018-10-09 DIAGNOSIS — Z8249 Family history of ischemic heart disease and other diseases of the circulatory system: Secondary | ICD-10-CM | POA: Insufficient documentation

## 2018-10-09 DIAGNOSIS — I480 Paroxysmal atrial fibrillation: Secondary | ICD-10-CM | POA: Diagnosis not present

## 2018-10-09 DIAGNOSIS — N183 Chronic kidney disease, stage 3 unspecified: Secondary | ICD-10-CM

## 2018-10-09 DIAGNOSIS — Z8639 Personal history of other endocrine, nutritional and metabolic disease: Secondary | ICD-10-CM | POA: Insufficient documentation

## 2018-10-09 DIAGNOSIS — Z87442 Personal history of urinary calculi: Secondary | ICD-10-CM | POA: Diagnosis not present

## 2018-10-09 DIAGNOSIS — I509 Heart failure, unspecified: Secondary | ICD-10-CM | POA: Diagnosis not present

## 2018-10-09 DIAGNOSIS — Z87891 Personal history of nicotine dependence: Secondary | ICD-10-CM | POA: Insufficient documentation

## 2018-10-09 DIAGNOSIS — G939 Disorder of brain, unspecified: Secondary | ICD-10-CM | POA: Diagnosis not present

## 2018-10-09 DIAGNOSIS — M797 Fibromyalgia: Secondary | ICD-10-CM | POA: Insufficient documentation

## 2018-10-09 DIAGNOSIS — E119 Type 2 diabetes mellitus without complications: Secondary | ICD-10-CM | POA: Diagnosis not present

## 2018-10-09 DIAGNOSIS — I10 Essential (primary) hypertension: Secondary | ICD-10-CM | POA: Diagnosis not present

## 2018-10-09 DIAGNOSIS — Z8673 Personal history of transient ischemic attack (TIA), and cerebral infarction without residual deficits: Secondary | ICD-10-CM | POA: Diagnosis not present

## 2018-10-09 DIAGNOSIS — Z951 Presence of aortocoronary bypass graft: Secondary | ICD-10-CM | POA: Diagnosis not present

## 2018-10-09 DIAGNOSIS — I25700 Atherosclerosis of coronary artery bypass graft(s), unspecified, with unstable angina pectoris: Secondary | ICD-10-CM | POA: Diagnosis not present

## 2018-10-09 DIAGNOSIS — Z79899 Other long term (current) drug therapy: Secondary | ICD-10-CM | POA: Diagnosis not present

## 2018-10-09 DIAGNOSIS — G510 Bell's palsy: Secondary | ICD-10-CM | POA: Diagnosis not present

## 2018-10-09 DIAGNOSIS — Z760 Encounter for issue of repeat prescription: Secondary | ICD-10-CM | POA: Insufficient documentation

## 2018-10-09 DIAGNOSIS — Z794 Long term (current) use of insulin: Secondary | ICD-10-CM | POA: Insufficient documentation

## 2018-10-09 DIAGNOSIS — E669 Obesity, unspecified: Secondary | ICD-10-CM | POA: Insufficient documentation

## 2018-10-09 DIAGNOSIS — I2510 Atherosclerotic heart disease of native coronary artery without angina pectoris: Secondary | ICD-10-CM | POA: Insufficient documentation

## 2018-10-09 DIAGNOSIS — Z6841 Body Mass Index (BMI) 40.0 and over, adult: Secondary | ICD-10-CM | POA: Insufficient documentation

## 2018-10-09 DIAGNOSIS — M109 Gout, unspecified: Secondary | ICD-10-CM | POA: Insufficient documentation

## 2018-10-09 DIAGNOSIS — G8929 Other chronic pain: Secondary | ICD-10-CM | POA: Insufficient documentation

## 2018-10-09 DIAGNOSIS — Z9981 Dependence on supplemental oxygen: Secondary | ICD-10-CM | POA: Diagnosis not present

## 2018-10-09 DIAGNOSIS — Z7901 Long term (current) use of anticoagulants: Secondary | ICD-10-CM | POA: Diagnosis not present

## 2018-10-09 DIAGNOSIS — I110 Hypertensive heart disease with heart failure: Secondary | ICD-10-CM | POA: Diagnosis not present

## 2018-10-09 DIAGNOSIS — Z833 Family history of diabetes mellitus: Secondary | ICD-10-CM | POA: Diagnosis not present

## 2018-10-09 DIAGNOSIS — I255 Ischemic cardiomyopathy: Secondary | ICD-10-CM | POA: Insufficient documentation

## 2018-10-09 DIAGNOSIS — R51 Headache: Secondary | ICD-10-CM | POA: Diagnosis not present

## 2018-10-09 DIAGNOSIS — E1122 Type 2 diabetes mellitus with diabetic chronic kidney disease: Secondary | ICD-10-CM | POA: Insufficient documentation

## 2018-10-09 DIAGNOSIS — G894 Chronic pain syndrome: Secondary | ICD-10-CM

## 2018-10-09 DIAGNOSIS — D333 Benign neoplasm of cranial nerves: Secondary | ICD-10-CM | POA: Diagnosis not present

## 2018-10-09 DIAGNOSIS — F17210 Nicotine dependence, cigarettes, uncomplicated: Secondary | ICD-10-CM | POA: Diagnosis not present

## 2018-10-09 DIAGNOSIS — G629 Polyneuropathy, unspecified: Secondary | ICD-10-CM | POA: Insufficient documentation

## 2018-10-09 DIAGNOSIS — I5032 Chronic diastolic (congestive) heart failure: Secondary | ICD-10-CM | POA: Insufficient documentation

## 2018-10-09 DIAGNOSIS — I252 Old myocardial infarction: Secondary | ICD-10-CM | POA: Insufficient documentation

## 2018-10-09 DIAGNOSIS — Z9889 Other specified postprocedural states: Secondary | ICD-10-CM | POA: Diagnosis not present

## 2018-10-09 DIAGNOSIS — Z809 Family history of malignant neoplasm, unspecified: Secondary | ICD-10-CM | POA: Insufficient documentation

## 2018-10-09 DIAGNOSIS — M62838 Other muscle spasm: Secondary | ICD-10-CM | POA: Insufficient documentation

## 2018-10-09 DIAGNOSIS — R519 Headache, unspecified: Secondary | ICD-10-CM

## 2018-10-09 LAB — BASIC METABOLIC PANEL
Anion gap: 11 (ref 5–15)
BUN: 23 mg/dL — ABNORMAL HIGH (ref 6–20)
CO2: 29 mmol/L (ref 22–32)
Calcium: 9.3 mg/dL (ref 8.9–10.3)
Chloride: 99 mmol/L (ref 98–111)
Creatinine, Ser: 1.76 mg/dL — ABNORMAL HIGH (ref 0.61–1.24)
GFR calc Af Amer: 49 mL/min — ABNORMAL LOW (ref >60)
GFR calc non Af Amer: 42 mL/min — ABNORMAL LOW (ref >60)
Glucose, Bld: 251 mg/dL — ABNORMAL HIGH (ref 70–99)
Potassium: 4 mmol/L (ref 3.5–5.1)
Sodium: 139 mmol/L (ref 135–145)

## 2018-10-09 LAB — CBC
HCT: 46.9 % (ref 39.0–52.0)
HEMOGLOBIN: 13.6 g/dL (ref 13.0–17.0)
MCH: 30 pg (ref 26.0–34.0)
MCHC: 29 g/dL — ABNORMAL LOW (ref 30.0–36.0)
MCV: 103.5 fL — ABNORMAL HIGH (ref 80.0–100.0)
Platelets: 163 10*3/uL (ref 150–400)
RBC: 4.53 MIL/uL (ref 4.22–5.81)
RDW: 12.4 % (ref 11.5–15.5)
WBC: 9 10*3/uL (ref 4.0–10.5)
nRBC: 0 % (ref 0.0–0.2)

## 2018-10-09 LAB — BRAIN NATRIURETIC PEPTIDE: B Natriuretic Peptide: 45.9 pg/mL (ref 0.0–100.0)

## 2018-10-09 MED ORDER — AMIODARONE HCL 100 MG PO TABS: 100.0000 mg | ORAL_TABLET | Freq: Every day | ORAL | 3 refills | Status: DC

## 2018-10-09 MED ORDER — TIZANIDINE HCL 4 MG PO TABS: ORAL_TABLET | ORAL | 2 refills | Status: DC

## 2018-10-09 MED ORDER — APIXABAN 5 MG PO TABS: 5.0000 mg | ORAL_TABLET | Freq: Two times a day (BID) | ORAL | 11 refills | Status: DC

## 2018-10-09 MED ORDER — HYDROCODONE-ACETAMINOPHEN 10-325 MG PO TABS: ORAL_TABLET | ORAL | 0 refills | Status: DC

## 2018-10-09 MED ORDER — ISOSORBIDE MONONITRATE ER 30 MG PO TB24: 30.0000 mg | ORAL_TABLET | Freq: Every day | ORAL | 6 refills | Status: DC

## 2018-10-09 MED ORDER — TOPIRAMATE 100 MG PO TABS: 100.0000 mg | ORAL_TABLET | Freq: Every day | ORAL | 5 refills | Status: AC

## 2018-10-09 NOTE — Progress Notes (Signed)
PCP: Dr Lynnae January  Primary Cardiologist: Dr Aundra Dubin   HPI: 58 yo with history of CAD s/p CABG, chronic diastolic CHF, atrial fibrillation, obesity, prior CVA s/p acoustic neuroma surgery, HTN,  diabetes, and PAF.   He had a single vessel CABG with LIMA-LAD in 2009.  In 6/18, he was in the hospital with diastolic CHF and atrial fibrillation with RVR.  He was started on Eliquis that time but not cardioverted.  After leaving the hospital, he did not continue apixaban and did not have cardiology followup.   Admitted 04/30/2018  with increased dyspnea and mental status changes. He was thought to have acute metobolic encephalopathy with hypercapneic respiratory failure.  Creatinine initially 4.19, has decreased to 2.15.  Last prior creatinine was 1.4 in 6/18.  He was on Bipap initially. He has been in atrial fibrillation with RVR, was started on Coreg but HR still running high. ECHO was nondiagnostic so TEE completed with cardioversion. He was placed amiodarone. Diuresed with IV lasix and later transitioned to torsemide 40 mg twice a day. He was discharged to [facility].   He returns today for HF follow up. Last visit amio was decreased to 100 mg daily. Overall doing fair. He is SOB moving around the house. This is his baseline. No edema. Sleeps in a chair at baseline. Wearing O2 qHS. Refuses BiPAP and does not want a sleep study. No CP. Has occasional palpitations that last 1 second. Has only happened twice and both times he was angry. Weight up 15 lbs on our scale. Eating a lot. Eating waffles before bed. Drinks 84 oz daily. Tries to limit salt. Urinates 3.5 L/day. Not weighing daily because scale is too small. Taking all medications. Has not missed Eliquis, but refills are >$100.   Review of systems complete and found to be negative unless listed in HPI.   SH:  Social History   Socioeconomic History  . Marital status: Married    Spouse name: Not on file  . Number of children: Not on file  . Years  of education: Not on file  . Highest education level: Not on file  Occupational History  . Not on file  Social Needs  . Financial resource strain: Not on file  . Food insecurity:    Worry: Not on file    Inability: Not on file  . Transportation needs:    Medical: Not on file    Non-medical: Not on file  Tobacco Use  . Smoking status: Former Smoker    Packs/day: 0.25    Years: 20.00    Pack years: 5.00    Types: Cigarettes  . Smokeless tobacco: Current User  . Tobacco comment: Rare tobacco.  "[name]".  Substance and Sexual Activity  . Alcohol use: No    Alcohol/week: 0.0 standard drinks  . Drug use: No  . Sexual activity: Yes  Lifestyle  . Physical activity:    Days per week: Not on file    Minutes per session: Not on file  . Stress: Not on file  Relationships  . Social connections:    Talks on phone: Not on file    Gets together: Not on file    Attends religious service: Not on file    Active member of club or organization: Not on file    Attends meetings of clubs or organizations: Not on file    Relationship status: Not on file  . Intimate partner violence:    Fear of current  or ex partner: Not on file    Emotionally abused: Not on file    Physically abused: Not on file    Forced sexual activity: Not on file  Other Topics Concern  . Not on file  Social History Narrative  . Not on file    FH:  Family History  Problem Relation Age of Onset  . CAD Father   . Hypertension Mother   . Cancer Other        multiple relatives  . Diabetes Maternal Grandmother   . Diabetes Maternal Aunt   . Leukemia Cousin   . Leukemia Maternal Grandfather     Past Medical History:  Diagnosis Date  . Brain tumor ([place])    Takes Topamax so patient will not have headaches  . Bruises easily   . CAD (coronary artery disease)    Cath 09, 99% LAD  . Cardiomyopathy, ischemic    Ischemic  . CHF (congestive heart failure) ([place])   . Cyst near coccyx   . Cyst near  tailbone   . Diabetes mellitus without complication (HCC)    borderline  . Edema of both legs    Takes Lasix  . Fibromyalgia   . Gout   . Headache(784.0)    with lights  . Hypertension    dr Percival Spanish  . Kidney stones   . Myocardial infarction ([place])   . Obesity   . Pneumonia    hx of  . Shortness of breath   . Stroke [facility])    Affected vision, walking, and facial drooping on right face    Current Outpatient Medications  Medication Sig Dispense Refill  . amiodarone (PACERONE) 100 MG tablet Take 1 tablet (100 mg total) by mouth daily. 30 tablet 3  . apixaban (ELIQUIS) 5 MG TABS tablet Take 1 tablet (5 mg total) by mouth 2 (two) times daily. 60 tablet 5  . artificial tears (LACRILUBE) OINT ophthalmic ointment Place 1 application into both eyes at bedtime as needed for dry eyes.     . carvedilol (COREG) 12.5 MG tablet Take 1 tablet (12.5 mg total) by mouth 2 (two) times daily with a meal. 60 tablet 3  . Hydroactive Dressings (DUODERM HYDROACTIVE) GEL duoderm or generic equivalent gel dressing. 4cmx4cm. Apply to wound q 3 days until healed. 5 g 2  . HYDROcodone-acetaminophen (NORCO) 10-325 MG tablet TAKE 1 TABLET BY MOUTH EVERY 6 HOURS AS NEEDED FOR PAIN MUST LAST 30 DAYS 120 tablet 0  . insulin glargine (LANTUS) 100 UNIT/ML injection Inject 45 Units into the skin daily. 2 hours after eating supper    . isosorbide mononitrate (IMDUR) 30 MG 24 hr tablet Take 30 mg by mouth daily.    Marland Kitchen nystatin (MYCOSTATIN/NYSTOP) powder APPLY  1  GRAM TOPICALLY TWICE DAILY AS NEEDED 180 g 3  . potassium chloride SA (K-DUR,KLOR-CON) 20 MEQ tablet Take 40 mEq by mouth 2 (two) times daily.     . sucralfate (CARAFATE) 1 g tablet Take 1 tablet (1 g total) by mouth 4 (four) times daily -  with meals and at bedtime. 30 tablet 0  . tiZANidine (ZANAFLEX) 4 MG tablet 1 tablet four times daily as needed for muscle spasms 270 tablet 2  . topiramate (TOPAMAX) 100 MG tablet Take 1 tablet (100 mg total) by mouth at  bedtime. 90 tablet 5  . torsemide (DEMADEX) 20 MG tablet TAKE 3 TABLETS (60 MG TOTAL) BY MOUTH 2 (TWO) TIMES DAILY. 180 tablet 2   No current facility-administered  medications for this encounter.     Vitals:   10/09/18 1044  BP: (!) 146/98  Pulse: 66  SpO2: 94%  Weight: (!) 173.9 kg (383 lb 6.4 oz)   Filed Weights   10/09/18 1044  Weight: (!) 173.9 kg (383 lb 6.4 oz)   Wt Readings from Last 3 Encounters:  10/09/18 (!) 173.9 kg (383 lb 6.4 oz)  10/09/18 (!) 171 kg (377 lb)  09/13/18 (!) 171.4 kg (377 lb 12.8 oz)    PHYSICAL EXAM: General: Obese. Arrived in wheelchair. No resp difficulty. HEENT: Right facial droop, slightly slurred speech. Neck: Supple. Neck thick, but I do not see any JVD. Carotids 2+ bilat; no bruits. No thyromegaly or nodule noted. Cor: PMI nondisplaced. RRR, No M/G/R noted Lungs: distant, clear. On 2L O2.  Abdomen: Soft, non-tender, non-distended, no HSM. No bruits or masses. +BS  Extremities: No cyanosis, clubbing, or rash. R and LLE no edema.  Neuro: Alert & orientedx3, cranial nerves grossly intact. moves all 4 extremities w/o difficulty. Affect pleasant  EKG: NSR 66 bpm, right axis deviation. Personally reviewed.   ASSESSMENT & PLAN:  1.  Chronic Diastolic CHF:  TEE 6/64 showed EF 55-60% with mildly dilated/mildly dysfunctional RV.  - NYHA III.Volume status stable on exam.  - Continue torsemide 60 mg twice a day. Check BMET and BNP today.  - Discussed limiting fluid intake to < 2 liters per day and low salt food choices.  2. PAF:  - S/P DC-CV 04/2018.  - Regular on exam. EKG today shows NSR  - Continue amiodarone to 100 mg daily. Check TSH and LFTs today. Confirmed that he is taking the correct dose and will let his pharmacy know.   - Continue eliquis 5 mg twice a day. Denies bleeding. CBC today. Provided Eliquis samples today and will start patient assistance enrollment.  3. CKD stage 3: - BMET today.  4. CAD: S/p LIMA-LAD in 2009.  - No s/s  ischemia.   - No ASA given Eliquis use.  - Continue rosuvastatin 20 mg 5. H/O metabolic encephalopathy: -  Wearing O2 continuously. Refuses BiPAP. Not interested in sleep study. 6.  HTN  - Elevated today. SBP 140s at home. He does not want to add a new BP med today. We discussed that his goal BP is <130/80. He would like to monitor at home and let us know if he continues to run high.  7. Obesity Body mass index is 55.01 kg/m.  - Discussed portion control. Discussed choosing low calorie foods.   Labs today. Provided Eliquis samples and will get him enrolled in patient assistance program for Eliquis. Follow up in 4 months.   Georgiana Shore NP-C  11:28 AM  Greater than 50% of the 30 minute visit was spent in counseling/coordination of care regarding disease state education, salt/fluid restriction, sliding scale diuretics, and medication compliance.

## 2018-10-09 NOTE — Patient Instructions (Signed)
It was great to see you today! No medication changes are needed at this time.  Labs today We will only contact you if something comes back abnormal or we need to make some changes. Otherwise no news is good news!  Your physician has requested that you regularly monitor and record your blood pressure readings at home. Please use the same machine at the same time of day to check your readings and record them to bring to your follow-up visit. Your goal blood pressure is LESS THAN 130/80. If your blood pressure continues to run HIGHER than 130/80, please give our office a call for further instructions/medication management.   Your physician recommends that you schedule a follow-up appointment in: 4 months  in the Advanced Practitioners (PA/NP) Clinic    Do the following things EVERYDAY: 1) Weigh yourself in the morning before breakfast. Write it down and keep it in a log. 2) Take your medicines as prescribed 3) Eat low salt foods-Limit salt (sodium) to 2000 mg per day.  4) Stay as active as you can everyday 5) Limit all fluids for the day to less than 2 liters

## 2018-10-09 NOTE — Progress Notes (Signed)
Medication Samples have been provided to the patient.  Drug name: eliquis     Strength: 5        Qty: 56  LOT: [id number]  Exp.Date: 01/2021  Dosing instructions: one tab by mouth twice daily  The patient has been instructed regarding the correct time, dose, and frequency of taking this medication, including desired effects and most common side effects.   Kerry Dory 11:49 AM 10/09/2018

## 2018-10-09 NOTE — Progress Notes (Signed)
Subjective:    Patient ID: [name]., male    DOB: 1961/07/12, 58 y.o.   MRN: [id number]  HPI   Mr. 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  For the most part he is maintaining fairly well.  He finds that the tizanidine is very helpful for spasms in his face.  He uses it 4 times a day as needed.  He is taking Topamax for neuropathic pain.  Uses hydrocodone every 6 hours as needed for breakthrough pain.  He maintains his right eye with regular lubricant and reports no problems in this area.  He stays mobile with his walker at a household level.  Denies any new cardiorespiratory issues at present.  He sees his cardiologist later today he tells me.  He is emptying his bowels and bladder.  Denies any problems with sleep.  Pain Inventory Average Pain 7 Pain Right Now 6 My pain is stabbing  In the last 24 hours, has pain interfered with the following? General activity 4 Relation with others 10 Enjoyment of life 10 What TIME of day is your pain at its worst? night Sleep (in general) Fair  Pain is worse with: walking Pain improves with: rest Relief from Meds: 5  Mobility walk with assistance use a walker how many minutes can you walk? 10 ability to climb steps?  no do you drive?  no  Function disabled: date disabled na I need assistance with the following:  bathing  Neuro/Psych tremor  Prior Studies Any changes since last visit?  no  Physicians involved in your care Any changes since last visit?  no   Family History  Problem Relation Age of Onset  . CAD Father   . Hypertension Mother   . Cancer Other        multiple relatives  . Diabetes Maternal Grandmother   . Diabetes Maternal Aunt   . Leukemia Cousin   . Leukemia Maternal Grandfather    Social History   Socioeconomic History  . Marital status: Married    Spouse name: Not on file  . Number of  children: Not on file  . Years of education: Not on file  . Highest education level: Not on file  Occupational History  . Not on file  Social Needs  . Financial resource strain: Not on file  . Food insecurity:    Worry: Not on file    Inability: Not on file  . Transportation needs:    Medical: Not on file    Non-medical: Not on file  Tobacco Use  . Smoking status: Former Smoker    Packs/day: 0.25    Years: 20.00    Pack years: 5.00    Types: Cigarettes  . Smokeless tobacco: Current User  . Tobacco comment: Rare tobacco.  "[name]".  Substance and Sexual Activity  . Alcohol use: No    Alcohol/week: 0.0 standard drinks  . Drug use: No  . Sexual activity: Yes  Lifestyle  . Physical activity:    Days per week: Not on file    Minutes per session: Not on file  . Stress: Not on file  Relationships  . Social connections:    Talks on phone: Not on file    Gets together: Not on file    Attends religious service: Not on file    Active member of club or organization: Not on  file    Attends meetings of clubs or organizations: Not on file    Relationship status: Not on file  Other Topics Concern  . Not on file  Social History Narrative  . Not on file   Past Surgical History:  Procedure Laterality Date  . 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 ANAL FISTULECTOMY    . CARDIOVERSION N/A 05/08/2018   Procedure: CARDIOVERSION;  Surgeon: [name], MD;  Location: [facility] ENDOSCOPY;  Service: Cardiovascular;  Laterality: N/A;  . CORONARY ARTERY BYPASS GRAFT     LIMA to LAD 2009  . CRANIOTOMY Right 11/20/2012   Procedure: CRANIOTOMY REPAIR DURAL/CENTRAL SPINAL FLUID LEAK;  Surgeon: [name], MD;  Location: [place] NEURO ORS;  Service: Neurosurgery;  Laterality: Right;  . EYE SURGERY     to coorect lid and double vision  . MEDIAN RECTUS REPAIR  Right 02/16/2016   Procedure: RIGHT LATERAL RECTUS RESECTION; SUPERIOR RECTUS RESECTION RIGHT EYE; RIGHT SUPERIOR RECTUS RECESSION; LATERAL TARSAL STRIP RIGHT LOWER EYELID;  Surgeon: [name], MD;  Location: [facility];  Service: Ophthalmology;  Laterality: Right;  . PLACEMENT OF LUMBAR DRAIN N/A 11/20/2012   Procedure: Attempted PLACEMENT OF LUMBAR DRAIN;  Surgeon: [name], MD;  Location: [place] NEURO ORS;  Service: Neurosurgery;  Laterality: N/A;  . RETROSIGMOID CRANIECTOMY FOR TUMOR RESECTION Right 10/11/2012   Procedure: RETROSIGMOID CRANIECTOMY FOR TUMOR RESECTION;  Surgeon: [name], MD;  Location: [place] NEURO ORS;  Service: Neurosurgery;  Laterality: Right;  Craniotomy for acoustic neuroma  . TEE WITHOUT CARDIOVERSION N/A 05/08/2018   Procedure: TRANSESOPHAGEAL ECHOCARDIOGRAM (TEE);  Surgeon: [name], MD;  Location: [facility] ENDOSCOPY;  Service: Cardiovascular;  Laterality: N/A;  . TRACHEOSTOMY TUBE PLACEMENT N/A 10/11/2012   Procedure: TRACHEOSTOMY;  Surgeon: Ascencion Dike, MD;  Location: [facility] NEURO [facility]S;  Service: ENT;  Laterality: N/A;  . UMBILICAL HERNIA REPAIR     Past Medical History:  Diagnosis Date  . Brain tumor ([place])    Takes Topamax so patient will not have headaches  . Bruises easily   . CAD (coronary artery disease)    Cath 09, 99% LAD  . Cardiomyopathy, ischemic    Ischemic  . CHF (congestive heart failure) ([place])   . Cyst near coccyx   . Cyst near tailbone   . Diabetes mellitus without complication (HCC)    borderline  . Edema of both legs    Takes Lasix  . Fibromyalgia   . Gout   . Headache(784.0)    with lights  . Hypertension    dr Percival Spanish  . Kidney stones   . Myocardial infarction ([place])   . Obesity   . Pneumonia    hx of  . Shortness of breath   . Stroke [facility])    Affected vision, walking, and facial drooping on right face   BP (!) 172/77   Pulse 75   Ht 5\' 10"  (1.778 m)   Wt (!) 377 lb (171 kg)   SpO2 91%   BMI 54.09 kg/m   Opioid Risk  Score:   Fall Risk Score:  `1  Depression screen PHQ 2/9  Depression screen [facility] 2/9 10/09/2018 09/23/2018 08/19/2018 05/31/2018 11/09/2017 09/10/2017 07/16/2017  Decreased Interest 0 0 1 1 1 1  0  Down, Depressed, Hopeless 0 0 1 1 1 1  0  PHQ - 2 Score 0  0 2 2 2 2  0  Altered sleeping - - - - - - -  Tired, decreased energy - - - - - - -  Change in appetite - - - - - - -  Feeling bad or failure about yourself  - - - - - - -  Trouble concentrating - - - - - - -  Moving slowly or fidgety/restless - - - - - - -  Suicidal thoughts - - - - - - -  PHQ-9 Score - - - - - - -     Review of Systems  Constitutional: Negative.   HENT: Negative.   Eyes: Negative.   Respiratory: Positive for wheezing.   Cardiovascular: Negative.   Gastrointestinal: Positive for constipation.  Endocrine: Negative.   Genitourinary: Negative.   Musculoskeletal: Negative.   Skin: Negative.   Allergic/Immunologic: Negative.   Neurological: Negative.   Hematological: Negative.   Psychiatric/Behavioral: Negative.   All other systems reviewed and are negative.      Objective:   Physical Exam  General: No acute distress HEENT: EOMI, oral membranes moist. Sclera white Cards: reg rate  Chest: normal effort Abdomen: Soft, NT, ND Skin: dry, intact Extremities: no edema Musculoskeletal: He exhibits edema. Neurological:strength nearly 4/5 to 5/5.Ongoing right cranial nerve VII palsy.  Unable to close eye.  Right facial droop.  Speech slightly dysarthric but very intelligible. Tongue deviates to the right. Hearing remains limited on the right side as well. Gait slightly wide-based with walker.  Attention and cognition seem to be showing some improvement.   Skin: Skin is warmand dry.  Psychiatric: Pleasant     Assessment & Plan:  1. 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 Frederico Hamman      Hydrocodone 10/325mg  one tablet every 6 hours as needed #120.  -We will continue the controlled substance monitoring program, this consists of regular clinic visits, examinations, routine drug screening, pill counts as well as use of [place] Controlled Substance Reporting System. NCCSRS was reviewed today.    -Medication was refilled and a second prescription was sent to the patient's pharmacy for next month.     2. Headaches right temporal area related to tumor/surgery.Continue Topamax 100 mg nightly.         -Ocular management per ophthalmology 3. Lower extremity edema, morbid obesity.Per primary.  4. Muscle Spasm: Continue Tizanidine.he may use it up to 4 times daily for facial spasms 5. Peripheral Neuropathy/cranial nerve V injury:topamax  64minutes of face to face patient care time was spent during this visit. All questions were encouraged and answered. Follow up in2 months with either me or my nurse practitioner.

## 2018-10-09 NOTE — Telephone Encounter (Signed)
RX printed to accompany PA application   Application faxed to [employer]

## 2018-10-09 NOTE — Patient Instructions (Signed)
PLEASE FEEL FREE TO CALL OUR OFFICE WITH ANY PROBLEMS OR QUESTIONS ([phone])      

## 2018-10-10 ENCOUNTER — Ambulatory Visit: Payer: Self-pay

## 2018-10-10 ENCOUNTER — Other Ambulatory Visit: Payer: Self-pay

## 2018-10-10 NOTE — Patient Outreach (Addendum)
[facility]) Care Management  10/10/2018  Ciel Yanes 1960-10-01 [id number]  Care Coordination: In-basket message received from Lillia Mountain, NP cardiology, that Mr. Smolinsky should be taking amiodarone 100 mg daily.  His EMR medication list has been updated and a new prescription was sent to his pharmacy.  Patient was informed of amiodarone dose at his visit yesterday.  Cardiology office is also completing an Eliquis patient assistance application for [name] and provided him with samples.    Tammy Sours, CSW is contact for Eliquis patient assistance at cardiology office.   Plan: In-basket message sent to PCP, Dr. Ethlyn Gallery to see if she will allow switch of Lantus to [facility], so we can help [name] apply to Lilly's patient assistance program.   Joetta Manners, [place] [phone]

## 2018-10-14 ENCOUNTER — Other Ambulatory Visit: Payer: Self-pay | Admitting: Pharmacy Technician

## 2018-10-14 ENCOUNTER — Other Ambulatory Visit: Payer: Self-pay

## 2018-10-14 NOTE — Patient Outreach (Signed)
[facility]) Care Management  10/14/2018  Dameon Soltis Jun 06, 1961 [id number]                                                  Medication Assistance Referral  Referral From: [facility] RPh Clearnce Sorrel  Medication/Company: Nancee Liter / [employer] Cares Patient application portion:  Mailed Provider application portion: Faxed  to Dr. Ethlyn Gallery  Follow up:  Will follow up with patient in 5-7 business days to confirm application(s) have been received.  Maud Deed Chana Bode [place] Certified Pharmacy Technician [place] Management Direct [phone]

## 2018-10-14 NOTE — Patient Outreach (Signed)
[facility]) Care Management  10/14/2018  Makoa Satz. 06-03-1961 [id number]  In-basket message received from Dr. Ethlyn Gallery allowing [place] to apply for [place] patient assistance from [employer].  Mr. Selley will remain on Lantus until assistance approved and he uses up all of his Lantus.  If approved, will need to follow up with Dr. Ethlyn Gallery to determine dose of Basaglar.  Successful outreach attempt to [name].  HIPAA identifiers verified.  Informed him that I am sending him a patient assistance application for Basaglar, which would take the place of Lantus if approved.  He verbalized understanding.   Plan: Route letter to [facility] CPhT, Etter Sjogren to begin Basaglar patient assistance.  Joetta Manners, PharmD Clinical Pharmacist [place] [phone]

## 2018-10-16 ENCOUNTER — Other Ambulatory Visit: Payer: Self-pay | Admitting: Family Medicine

## 2018-10-16 MED ORDER — BASAGLAR KWIKPEN 100 UNIT/ML ~~LOC~~ SOPN: 45.0000 [IU] | PEN_INJECTOR | Freq: Every day | SUBCUTANEOUS | 3 refills | Status: DC

## 2018-10-21 ENCOUNTER — Other Ambulatory Visit: Payer: Self-pay

## 2018-10-21 NOTE — Patient Outreach (Signed)
  [facility]) Care Management Chronic Special Needs Program  10/21/2018  Name: [name]. DOB: May 20, 1961  MRN: [id number]  Mr. Christoffer Currier is enrolled in a chronic special needs plan for Diabetes. RNCM called to follow up. No answer. Unable to leave message.   Plan: RNCM will follow up within the next 3-4 weeks.  Thea Silversmith, RN, MSN, [place] [phone]

## 2018-10-28 ENCOUNTER — Other Ambulatory Visit (HOSPITAL_COMMUNITY): Payer: Self-pay | Admitting: Cardiology

## 2018-10-29 ENCOUNTER — Other Ambulatory Visit: Payer: Self-pay | Admitting: Pharmacy Technician

## 2018-10-29 ENCOUNTER — Other Ambulatory Visit: Payer: Self-pay

## 2018-10-29 DIAGNOSIS — I5042 Chronic combined systolic (congestive) and diastolic (congestive) heart failure: Secondary | ICD-10-CM | POA: Diagnosis not present

## 2018-10-29 NOTE — Patient Outreach (Signed)
[name]) Care Management  10/29/2018  [name]. [birth year] [id number]    Successful call placed to patient regarding patient assistance application(s) for Basaglar , HIPAA identifiers verified. [name] states that he has not received patient assistance application in the mail for [place]. Patient also has questions about the difference between Lantus and Basaglar.  Will route note to [place] for consultation   Follow up:  Will prepare new Lilly Cares application to be mailed out to patient.  Maud Deed Chana Bode [place] Certified Pharmacy Technician [place] Management Direct Dial:[phone]

## 2018-10-29 NOTE — Patient Outreach (Signed)
[place] [facility]) Care Management  10/29/2018  Kayhan Boardley. 02-11-1961 [id number]  Successful outreach to Mr. Rolanda Jay.   HIPAA identifiers verified.   Message from Etter Sjogren, CPhT, that Mr. Rhett was requesting some information about [employer], agricultural. Discussed the product similarities with Lantus that he currently takes and answered his questions.  Of note, he is currently using vials, so he will need education on how to use the [place] when it arrives.  He is aware that he should store all his insulin in the refrigerator until ready to use.   Gave him CPhT, Judene Companion number to call if he has further questions about the patient assistance process.  She is sending him another application because he states he did not receive the initial application.  Joetta Manners, PharmD Clinical Pharmacist [place] [phone]

## 2018-11-06 ENCOUNTER — Other Ambulatory Visit (HOSPITAL_COMMUNITY): Payer: Self-pay | Admitting: Cardiology

## 2018-11-06 MED ORDER — APIXABAN 5 MG PO TABS: 5.0000 mg | ORAL_TABLET | Freq: Two times a day (BID) | ORAL | 3 refills | Status: DC

## 2018-11-06 NOTE — Telephone Encounter (Signed)
rx printed to accompany patient assistance application  

## 2018-11-13 ENCOUNTER — Other Ambulatory Visit: Payer: Self-pay | Admitting: Internal Medicine

## 2018-11-14 ENCOUNTER — Other Ambulatory Visit: Payer: Self-pay | Admitting: Pharmacy Technician

## 2018-11-14 NOTE — Patient Outreach (Signed)
[facility]) Care Management  11/14/2018  [name]. Dec 18, 1960 [id number]   Received patient portion(s) of patient assistance application for Basaglar. Faxed completed application and required documents into [employer].  Will follow up with company in 3-5 business days to check status of application.  Maud Deed Chana Bode [place] Certified Pharmacy Technician [place] Management Direct Dial:[phone]

## 2018-11-18 ENCOUNTER — Other Ambulatory Visit: Payer: Self-pay

## 2018-11-18 NOTE — Patient Outreach (Addendum)
[facility]) Care Management Chronic Special Needs Program  11/18/2018  Name: [name]. DOB: Nov 14, 1960  MRN: [id number]  [name] is enrolled in a chronic special needs plan for Diabetes. Reviewed and updated care plan.  Subjective: client reports he is supposed to have the [facility] pen, but states he does not want to use at this time. He reports it requires that he have to go back and forth to his provider office and he is staying in at this time due to the Virus. Client reports he does not check his blood sugar in the morning. He states he only checks at night due to the cost of the strips, he reports he does not have enough. He states his blood sugar last night was around 250. Client does not know how often he is supposed to check his blood sugar levels. He reports he has a scale, but it is too little. Client declined a scale offered to him by [facility].   Goals Addressed            This Visit's Progress   . Client understands the importance of follow-up with providers by attending scheduled visits   On track    Please follow up with your primary care doctor and ask: 1) What is your Target Hemoglobin A1C? 2) What is your Target Blood sugar range? 3) How often are your supposed to be checking your blood sugar?    . Client will not report change from baseline and no repeated symptoms of stroke with in the next 9 months    On track   . Client will report abillity to obtain Medications within 6 months.   On track   . Client will report no worsening of symptoms of Atrial Fibrillation within the next 6 months   On track   . Client will report no worsening of symptoms related to heart disease within the next 9 months    On track   . Client will use Assistive Devices as needed and verbalize understanding of device use   On track   . Client will verbalize knowledge of chronic lung disease as evidenced by no ED visits or Inpatient stays related to chronic lung  disease    On track   . Client will verbalize knowledge of self management of Hypertension as evidences by BP reading of 140/90 or less; or as defined by provider   On track   . COMPLETED: Client will verbalize understanding of treatment plan for impaired skin integrity and follow up with provider as scheduled within the next 3 months.       Client reports wound healed.    . Maintain timely refills of diabetic medication as prescribed within the year .   On track   . Obtain annual  Lipid Profile, LDL-C   On track   . Obtain Annual Eye (retinal)  Exam    On track   . Obtain Annual Foot Exam   On track   . Obtain annual screen for micro albuminuria (urine) , nephropathy (kidney problems)   On track   . Obtain Hemoglobin A1C at least 2 times per year   On track   . Visit Primary Care Provider or Endocrinologist at least 2 times per year    On track     Encouraged client to contact upcoming provider offices prior to next appointment regarding his hesitancy in going out to provider visits at this time due to concerns about  catching coronovirus. Coronavirus information discussed.  Plan: care coordination with pharmacy; care coordination with primary care provider. Continue to follow.      Thea Silversmith, RN, MSN, [place] [phone]   .

## 2018-11-21 ENCOUNTER — Other Ambulatory Visit: Payer: Self-pay | Admitting: Pharmacist

## 2018-11-21 ENCOUNTER — Other Ambulatory Visit (HOSPITAL_COMMUNITY): Payer: Self-pay | Admitting: Adult Health

## 2018-11-21 NOTE — Patient Outreach (Signed)
[facility]) Care Management  11/21/2018  [name]. 1961/03/24 [id number]  Incoming call from patient, HIPAA details verified.  Explained purpose of call to patient.  Patient confirms he is using Accucheck guide meter and testing once a day due to cost.   Review with patient per his insurance plan benefit information online, appears Freestyle, One Touch and Precision are preferred at potentially $0 co-insurance. Patient was counseled his insurance or pharmacy would be able to verify the final cost.  Described the different meters and he reports he is interested in basic meter like what he has now.   He does state if the provider feels appropriate he would like to test more than once daily.   Plan:  Recommend consideration to change to a preferred meter such as One Touch Ultra which may a little larger in size.   If appropriate, consider issuing prescription to allow for testing more than one time daily, as he is on insulin, he may benefit from additional blood sugar monitoring.   Note routed to primary care Dr Ethlyn Gallery, and patient states he uses [facility] [address].    Karrie Meres, PharmD, [place] [phone]

## 2018-11-21 NOTE — Progress Notes (Signed)
Ok to send preferred meter and testing supplies to his preferred pharmacy. Thanks.

## 2018-11-21 NOTE — Patient Outreach (Signed)
[facility]) Care Management  11/21/2018  Korbyn Chopin. February 20, 1961 [id number]  Received a message from Nurse Case Manager, Denton Brick, patient is having difficulty affording his accu check blood glucose monitoring supplies.    Appears patient has the Chronic Special Needs Program through [employer].  Based on review of preferred glucose testing supplies from health plan website, appears Freestyle, One Touch, and Precision are the preferred meter brands which may be contributing to increased cost for Accu check as this is not preferred.   Attempted to reach patient via phone, no answer, and unable to leave a message.   Plan:  Will make another outreach attempt to patient in the next week.    Will provide update to nurse case manager via in basket message.    Karrie Meres, PharmD, [place] [phone]

## 2018-11-24 ENCOUNTER — Other Ambulatory Visit: Payer: Self-pay | Admitting: Internal Medicine

## 2018-11-26 ENCOUNTER — Telehealth (HOSPITAL_COMMUNITY): Payer: Self-pay | Admitting: *Deleted

## 2018-11-26 NOTE — Telephone Encounter (Signed)
Pt left VM requesting return call. I called pt back no answer/left voice message.

## 2018-11-29 ENCOUNTER — Other Ambulatory Visit: Payer: Self-pay | Admitting: Internal Medicine

## 2018-11-29 DIAGNOSIS — I5042 Chronic combined systolic (congestive) and diastolic (congestive) heart failure: Secondary | ICD-10-CM | POA: Diagnosis not present

## 2018-12-06 ENCOUNTER — Other Ambulatory Visit: Payer: Self-pay

## 2018-12-06 ENCOUNTER — Encounter: Payer: HMO | Attending: Physical Medicine & Rehabilitation | Admitting: Registered Nurse

## 2018-12-06 ENCOUNTER — Encounter: Payer: Self-pay | Admitting: Registered Nurse

## 2018-12-06 VITALS — BP 145/72 | HR 65 | Ht 70.0 in | Wt 377.0 lb

## 2018-12-06 DIAGNOSIS — I255 Ischemic cardiomyopathy: Secondary | ICD-10-CM | POA: Insufficient documentation

## 2018-12-06 DIAGNOSIS — E119 Type 2 diabetes mellitus without complications: Secondary | ICD-10-CM | POA: Insufficient documentation

## 2018-12-06 DIAGNOSIS — F17210 Nicotine dependence, cigarettes, uncomplicated: Secondary | ICD-10-CM | POA: Insufficient documentation

## 2018-12-06 DIAGNOSIS — R600 Localized edema: Secondary | ICD-10-CM | POA: Insufficient documentation

## 2018-12-06 DIAGNOSIS — G629 Polyneuropathy, unspecified: Secondary | ICD-10-CM | POA: Insufficient documentation

## 2018-12-06 DIAGNOSIS — R51 Headache: Secondary | ICD-10-CM | POA: Insufficient documentation

## 2018-12-06 DIAGNOSIS — G8929 Other chronic pain: Secondary | ICD-10-CM | POA: Insufficient documentation

## 2018-12-06 DIAGNOSIS — M62838 Other muscle spasm: Secondary | ICD-10-CM | POA: Insufficient documentation

## 2018-12-06 DIAGNOSIS — G510 Bell's palsy: Secondary | ICD-10-CM | POA: Insufficient documentation

## 2018-12-06 DIAGNOSIS — Z951 Presence of aortocoronary bypass graft: Secondary | ICD-10-CM | POA: Insufficient documentation

## 2018-12-06 DIAGNOSIS — I110 Hypertensive heart disease with heart failure: Secondary | ICD-10-CM | POA: Insufficient documentation

## 2018-12-06 DIAGNOSIS — M109 Gout, unspecified: Secondary | ICD-10-CM | POA: Insufficient documentation

## 2018-12-06 DIAGNOSIS — G894 Chronic pain syndrome: Secondary | ICD-10-CM

## 2018-12-06 DIAGNOSIS — M797 Fibromyalgia: Secondary | ICD-10-CM | POA: Insufficient documentation

## 2018-12-06 DIAGNOSIS — Z8673 Personal history of transient ischemic attack (TIA), and cerebral infarction without residual deficits: Secondary | ICD-10-CM | POA: Insufficient documentation

## 2018-12-06 DIAGNOSIS — Z9889 Other specified postprocedural states: Secondary | ICD-10-CM | POA: Diagnosis not present

## 2018-12-06 DIAGNOSIS — Z809 Family history of malignant neoplasm, unspecified: Secondary | ICD-10-CM | POA: Insufficient documentation

## 2018-12-06 DIAGNOSIS — G609 Hereditary and idiopathic neuropathy, unspecified: Secondary | ICD-10-CM | POA: Diagnosis not present

## 2018-12-06 DIAGNOSIS — I2510 Atherosclerotic heart disease of native coronary artery without angina pectoris: Secondary | ICD-10-CM | POA: Insufficient documentation

## 2018-12-06 DIAGNOSIS — Z87442 Personal history of urinary calculi: Secondary | ICD-10-CM | POA: Insufficient documentation

## 2018-12-06 DIAGNOSIS — Z8249 Family history of ischemic heart disease and other diseases of the circulatory system: Secondary | ICD-10-CM | POA: Insufficient documentation

## 2018-12-06 DIAGNOSIS — D333 Benign neoplasm of cranial nerves: Secondary | ICD-10-CM | POA: Diagnosis not present

## 2018-12-06 DIAGNOSIS — Z930 Tracheostomy status: Secondary | ICD-10-CM | POA: Insufficient documentation

## 2018-12-06 DIAGNOSIS — I252 Old myocardial infarction: Secondary | ICD-10-CM | POA: Insufficient documentation

## 2018-12-06 DIAGNOSIS — Z760 Encounter for issue of repeat prescription: Secondary | ICD-10-CM | POA: Insufficient documentation

## 2018-12-06 DIAGNOSIS — I509 Heart failure, unspecified: Secondary | ICD-10-CM | POA: Insufficient documentation

## 2018-12-06 MED ORDER — HYDROCODONE-ACETAMINOPHEN 10-325 MG PO TABS: ORAL_TABLET | ORAL | 0 refills | Status: DC

## 2018-12-06 NOTE — Progress Notes (Signed)
Subjective:    Patient ID: [name]., male    DOB: Oct 23, 1960, 58 y.o.   MRN: [id number]  HPI: [name]. is a 58 y.o. male  His appointment was changed, due to national recommendations of social distancing due to [name], an audio/video telehealth visit is felt to be most appropriate for this patient at this time.  See Chart message from today for the patient's consent to telehealth from [place].     He states his pain is located in his face ( facial) right side and bilateral feet pain. He rates his pain 7. His current exercise regime is walking and performing stretching exercises.  Mr. Robinette Morphine equivalent is 40.00  MME.  His last UDS was Performed on 05/31/2018, it was consistent.   [name] CMA asked the Health and History Questions. This provider and [name] verified we were speaking with the correct person using two identifiers.  Pain Inventory Average Pain 7 Pain Right Now 7 My pain is intermittent and sharp  In the last 24 hours, has pain interfered with the following? General activity 7 Relation with others 0 Enjoyment of life 0 What TIME of day is your pain at its worst? night Sleep (in general) Fair  Pain is worse with: walking Pain improves with: rest and medication Relief from Meds: 8  Mobility walk with assistance use a walker ability to climb steps?  yes do you drive?  no  Function disabled: date disabled .  Neuro/Psych bowel control problems weakness numbness tremor tingling trouble walking spasms  Prior Studies Any changes since last visit?  no  Physicians involved in your care Any changes since last visit?  no   Family History  Problem Relation Age of Onset  . CAD Father   . Hypertension Mother   . Cancer Other        multiple relatives  . Diabetes Maternal Grandmother   . Diabetes Maternal Aunt   . Leukemia Cousin   . Leukemia Maternal  Grandfather    Social History   Socioeconomic History  . Marital status: Married    Spouse name: Not on file  . Number of children: Not on file  . Years of education: Not on file  . Highest education level: Not on file  Occupational History  . Not on file  Social Needs  . Financial resource strain: Not on file  . Food insecurity:    Worry: Not on file    Inability: Not on file  . Transportation needs:    Medical: Not on file    Non-medical: Not on file  Tobacco Use  . Smoking status: Former Smoker    Packs/day: 0.25    Years: 20.00    Pack years: 5.00    Types: Cigarettes  . Smokeless tobacco: Current User  . Tobacco comment: Rare tobacco.  "[name]".  Substance and Sexual Activity  . Alcohol use: No    Alcohol/week: 0.0 standard drinks  . Drug use: No  . Sexual activity: Yes  Lifestyle  . Physical activity:    Days per week: Not on file    Minutes per session: Not on file  . Stress: Not on file  Relationships  . Social connections:    Talks on phone: Not on file    Gets together: Not on file    Attends religious service: Not on file    Active member of club or  organization: Not on file    Attends meetings of clubs or organizations: Not on file    Relationship status: Not on file  Other Topics Concern  . Not on file  Social History Narrative  . Not on file   Past Surgical History:  Procedure Laterality Date  . 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 ANAL FISTULECTOMY    . CARDIOVERSION N/A 05/08/2018   Procedure: CARDIOVERSION;  Surgeon: [name], MD;  Location: [facility] ENDOSCOPY;  Service: Cardiovascular;  Laterality: N/A;  . CORONARY ARTERY BYPASS GRAFT     LIMA to LAD 2009  . CRANIOTOMY Right 11/20/2012   Procedure: CRANIOTOMY REPAIR DURAL/CENTRAL SPINAL FLUID LEAK;  Surgeon: [name], MD;  Location: [facility] NEURO  ORS;  Service: Neurosurgery;  Laterality: Right;  . EYE SURGERY     to coorect lid and double vision  . MEDIAN RECTUS REPAIR Right 02/16/2016   Procedure: RIGHT LATERAL RECTUS RESECTION; SUPERIOR RECTUS RESECTION RIGHT EYE; RIGHT SUPERIOR RECTUS RECESSION; LATERAL TARSAL STRIP RIGHT LOWER EYELID;  Surgeon: [name], MD;  Location: [facility];  Service: Ophthalmology;  Laterality: Right;  . PLACEMENT OF LUMBAR DRAIN N/A 11/20/2012   Procedure: Attempted PLACEMENT OF LUMBAR DRAIN;  Surgeon: [name], MD;  Location: [facility] NEURO ORS;  Service: Neurosurgery;  Laterality: N/A;  . RETROSIGMOID CRANIECTOMY FOR TUMOR RESECTION Right 10/11/2012   Procedure: RETROSIGMOID CRANIECTOMY FOR TUMOR RESECTION;  Surgeon: [name], MD;  Location: [facility] NEURO ORS;  Service: Neurosurgery;  Laterality: Right;  Craniotomy for acoustic neuroma  . TEE WITHOUT CARDIOVERSION N/A 05/08/2018   Procedure: TRANSESOPHAGEAL ECHOCARDIOGRAM (TEE);  Surgeon: [name], MD;  Location: [facility] ENDOSCOPY;  Service: Cardiovascular;  Laterality: N/A;  . TRACHEOSTOMY TUBE PLACEMENT N/A 10/11/2012   Procedure: TRACHEOSTOMY;  Surgeon: Ascencion Dike, MD;  Location: [facility];  Service: ENT;  Laterality: N/A;  . UMBILICAL HERNIA REPAIR     Past Medical History:  Diagnosis Date  . Brain tumor ([place])    Takes Topamax so patient will not have headaches  . Bruises easily   . CAD (coronary artery disease)    Cath 09, 99% LAD  . Cardiomyopathy, ischemic    Ischemic  . CHF (congestive heart failure) ([place])   . Cyst near coccyx   . Cyst near tailbone   . Diabetes mellitus without complication (HCC)    borderline  . Edema of both legs    Takes Lasix  . Fibromyalgia   . Gout   . Headache(784.0)    with lights  . Hypertension    dr Percival Spanish  . Kidney stones   . Myocardial infarction ([place])   . Obesity   . Pneumonia    hx of  . Shortness of breath   . Stroke [facility])    Affected vision, walking, and facial drooping on right face    Ht 5\' 10"  (1.778 m)   Wt (!) 377 lb (171 kg)   BMI 54.09 kg/m   Opioid Risk Score:   Fall Risk Score:  `1  Depression screen PHQ 2/9  Depression screen [facility] 2/9 2/Arellano/2020 09/23/2018 08/19/2018 05/31/2018 11/09/2017 09/10/2017 07/16/2017  Decreased Interest 0 0 1 1 1 1  0  Down, Depressed, Hopeless 0 0 1 1 1 1  0  PHQ - 2 Score 0 0 2 2 2 2  0  Altered sleeping - - - - - - -  Tired, decreased energy - - - - - - -  Change in appetite - - - - - - -  Feeling bad or failure about yourself  - - - - - - -  Trouble concentrating - - - - - - -  Moving slowly or fidgety/restless - - - - - - -  Suicidal thoughts - - - - - - -  PHQ-9 Score - - - - - - -    Review of Systems  Constitutional: Negative.   HENT: Negative.   Eyes: Negative.   Respiratory: Positive for wheezing.   Gastrointestinal: Positive for constipation.  Endocrine: Negative.   Genitourinary: Negative.   Musculoskeletal: Positive for gait problem.  Skin: Negative.   Allergic/Immunologic: Negative.   Neurological: Positive for tremors, weakness and numbness.       Tingling   Psychiatric/Behavioral: Negative.   All other systems reviewed and are negative.      Objective:   Physical Exam Vitals signs and nursing note reviewed.  Constitutional:      Appearance: Normal appearance.  Musculoskeletal:     Comments: No Physical Exam Performed: Virtual Visit  Neurological:     Mental Status: He is oriented to person, place, and time.           Assessment & Plan:  1. 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 S/P RIGHT LATERAL RECTUS RESECTION; SUPERIOR RECTUS RESECTION RIGHT EYE; RIGHT SUPERIOR RECTUS RECESSION; LATERAL TARSAL STRIP RIGHT LOWER EYELID with Dr. Frederico Hamman on 02/16/16. Refilled: Hydrocodone 10/325mg  one tablet every 6 hours as needed #120. Second scripte-scribefor the following month.04//17/2020 We will continue the opioid  monitoring program, this consists of regular clinic visits, examinations, urine drug screen, pill counts as well as use of [place] Controlled Substance Reporting System. Continue eye ointment as ordered. 2. Headaches right temporal area related to tumor/surgery.No complaints today.Continue Topamax.12/06/2018 3. Lower extremity edema, morbid obesity. Continue to Monitor. PCP Following. 12/06/2018 4. Muscle Spasm: Continue Tizanidine.12/06/2018 5. Peripheral Neuropathy:Continue Topamax. Continue to Monitor. 12/06/2018  F/U in 2 months  Telephone Call  Location of patient: In his Home Location of provider: Office Established patient Time spent on call: 15 Minutes

## 2018-12-09 ENCOUNTER — Other Ambulatory Visit: Payer: Self-pay | Admitting: Internal Medicine

## 2018-12-09 ENCOUNTER — Other Ambulatory Visit (HOSPITAL_COMMUNITY): Payer: Self-pay | Admitting: Adult Health

## 2018-12-12 ENCOUNTER — Telehealth: Payer: Self-pay | Admitting: Family Medicine

## 2018-12-12 ENCOUNTER — Other Ambulatory Visit: Payer: Self-pay | Admitting: Family Medicine

## 2018-12-12 NOTE — Telephone Encounter (Signed)
This medication is from a historical provider. Ok to fill?

## 2018-12-12 NOTE — Telephone Encounter (Signed)
Copied from [place] [id number]. Topic: Quick Communication - Rx Refill/Question >> Dec 12, 2018  8:39 AM Sheran Luz wrote: Medication: insulin glargine (LANTUS) 100 UNIT/ML injection   Patient is requesting a refill of this medication.   Preferred Pharmacy (with phone number or street name):[id number] [place], [place] - [address] [phone] (Phone) [phone] (Fax)

## 2018-12-13 NOTE — Telephone Encounter (Signed)
ok 

## 2018-12-13 NOTE — Telephone Encounter (Signed)
Requested medication (s) are due for refill today: yes  Requested medication (s) are on the active medication list: yes  Last refill: 10/07/2018  Future visit scheduled: no  Notes to clinic: historical provider    Requested Prescriptions  Pending Prescriptions Disp Refills   LANTUS 100 UNIT/ML injection [Pharmacy Med Name: LANTUS 100 UNIT/ML VIAL] 20 mL 3    Sig: INJECT 45 UNITS INTO THE SKIN AT BEDTIME FOR 120 DOSES     Endocrinology:  Diabetes - Insulins Failed - 12/13/2018  9:23 AM      Failed - HBA1C is between 0 and 7.9 and within 180 days    Hgb A1c MFr Bld  Date Value Ref Range Status  09/13/2018 CANCELED      Comment:    TEST NOT PERFORMED . No lavender-top tube received.  Result canceled by the ancillary.          Passed - Valid encounter within last 6 months    Recent Outpatient Visits          3 months ago Pressure injury of skin, unspecified injury stage, unspecified location   [employer] [facility] [employer], Steele Berg, MD

## 2018-12-13 NOTE — Telephone Encounter (Signed)
Called for update on RX

## 2018-12-13 NOTE — Telephone Encounter (Signed)
Rx has been sent in. 

## 2018-12-20 ENCOUNTER — Other Ambulatory Visit: Payer: Self-pay | Admitting: Pharmacist

## 2018-12-20 ENCOUNTER — Other Ambulatory Visit: Payer: Self-pay | Admitting: Pharmacy Technician

## 2018-12-20 NOTE — Patient Outreach (Signed)
[facility]) Care Management  12/20/2018  [name]. 04/08/1961 [id number]    Successful call placed to patient regarding patient assistance update for Basaglar, HIPAA identifiers verified. Informed patient that [employer] requires a different document for proof of income or he can call into verify it with company.   3 way call placed to [facility] with patient.Barnett Applebaum was able to to take verbal verification of patient's income. She states to check back in 1-2 business days for application status.  While on the phone patient states that he doesn't want to switch to [place] because he has never taken it before and he might be allergic.... Informed him that I would have a pharmacist outreach him to discuss any questions he might have about medication. He states that his Lantus has been [facility] I informed him that it has been free thru [place] C-SNP program however wen he reaches the [facility] coverage, he will have to pay the full price.   He also questioned if there is assistance for Eliquis and Hydrocodone. I informed him that there is for Eliquis but we [facility]) do not do patient assistance for Hydrocodone. I(nformed him I will have pharmacist discuss the Eliquis patient assistance process with him as well.  Will route note to [place] for consultation/Assessment  Maud Deed. Chana Bode [place] Certified Pharmacy Technician [place] Management Direct Dial:[phone]

## 2018-12-21 ENCOUNTER — Other Ambulatory Visit: Payer: Self-pay | Admitting: Internal Medicine

## 2018-12-22 ENCOUNTER — Other Ambulatory Visit (HOSPITAL_COMMUNITY): Payer: Self-pay | Admitting: Adult Health

## 2018-12-23 ENCOUNTER — Ambulatory Visit: Payer: HMO

## 2018-12-24 ENCOUNTER — Ambulatory Visit: Payer: Self-pay

## 2018-12-24 NOTE — Patient Outreach (Addendum)
[facility]) Care Management [place]  12/24/2018  Gavon Majano. Jul 15, 1961 [id number]  Reason for referral: medication assistance/[employer] C-SNP  Successful call with [name] with HIPAA identifiers verified.  Patient states his medications are now too expensive (entering coverage gap-Medicare).  He is currently using Lantus (vial) for his insulin.  Lantus patient assistance requires 3% out of pocket spend ($600 for patient) which he has not met.  He does qualify for [employer] (insulin glargine) at this time, however patient is unsure how to use a pen.  He is willing to try this method as this is the only feasible option at this time.  We can revisit applying for Lantus when OOP is met.  A1c appears to be uncontrolled.  Last lab A1c 10.5 on 04/2018.  Encouraged patient to continuing taking medications as prescribed and adhere to DM healthy lifestyle/diet.  New glucometer (One Touch Ultra) request sent to PCP.  This will be covered by insurance and meet patient needs  Re: Eliquis- patient is $660 away from obtaining Eliquis patient assistance.  Will continue to follow  PLAN: -I will follow up with patient next week  Regina Eck, PharmD, [place]  [phone]

## 2018-12-25 ENCOUNTER — Other Ambulatory Visit: Payer: Self-pay | Admitting: Pharmacy Technician

## 2018-12-25 ENCOUNTER — Ambulatory Visit: Payer: Self-pay | Admitting: Pharmacist

## 2018-12-25 ENCOUNTER — Other Ambulatory Visit: Payer: Self-pay | Admitting: Family Medicine

## 2018-12-25 ENCOUNTER — Telehealth: Payer: Self-pay | Admitting: *Deleted

## 2018-12-25 MED ORDER — ONETOUCH ULTRA MINI W/DEVICE KIT: PACK | 0 refills | Status: AC

## 2018-12-25 MED ORDER — ONETOUCH ULTRASOFT LANCETS MISC: 12 refills | Status: AC

## 2018-12-25 MED ORDER — ONETOUCH ULTRA BLUE VI STRP: ORAL_STRIP | 12 refills | Status: AC

## 2018-12-25 NOTE — Patient Outreach (Signed)
[facility]) Care Management  12/25/2018  [name]. 04-22-1961 [id number]    Follow up call placed to [facility] regarding patient assistance application(s) for [name] confirms patient has been approved as of 5/4 until 08/21/19. Order currently being processed thru [facility].   Follow up:  Will route note to [place] to update.  Maud Deed Chana Bode [place] Certified Pharmacy Technician [place] Management Direct Dial:[phone]

## 2018-12-25 NOTE — Telephone Encounter (Signed)
Rx done and message sent to [place].

## 2018-12-25 NOTE — Telephone Encounter (Signed)
-----   Message from Rebecca Eaton, [place] sent at 12/25/2018  7:53 AM EDT ----- Regarding: FW: patient needs new glucometer  ----- Message ----- From: Lavera Guise, [facility] Sent: 12/24/2018  10:02 PM EDT To: Caren Macadam, MD, Rebecca Eaton, CMA Subject: patient needs new glucometer                   Hello!  We are assisting patient with medication assistance for insulin (now in "donut hole").  His new insurance also insists that he use a One touch glucometer.  Can you please call in Rxs for: One Touch Ultra glucometer, test strips, lancets [facility] [address] (preferred pharmacy in chart)  Thank you! Regina Eck, PharmD, [place]  [phone]

## 2018-12-25 NOTE — Progress Notes (Unsigned)
on

## 2018-12-26 ENCOUNTER — Ambulatory Visit: Payer: Self-pay | Admitting: Pharmacist

## 2018-12-29 DIAGNOSIS — I5042 Chronic combined systolic (congestive) and diastolic (congestive) heart failure: Secondary | ICD-10-CM | POA: Diagnosis not present

## 2018-12-30 ENCOUNTER — Other Ambulatory Visit: Payer: Self-pay | Admitting: Pharmacist

## 2018-12-30 ENCOUNTER — Ambulatory Visit: Payer: Self-pay | Admitting: Pharmacist

## 2019-01-02 NOTE — Patient Outreach (Addendum)
[place] [facility]) Care Management [place]  12/30/2018  [name]. [birth year] [id number]  Reason for referral: medication assistance/management  One Touch Ultra glucometer & supplies filled per patient's PCP.  This will provide patient with a newer, larger glucometer in order for patient to check his blood sugars with ease.  Will follow up with patient next week regarding new meter and new Basaglar insulin (insulin glargine).  Of note, patient is not familiar with insulin pens, therefore will walk him through this process and provide video instruction as able.  Basaglar was the only free insulin program available to patient at this time.  We can explore other options as they become available if this proves to be an inefficient method of insulin delivery.  PLAN: -I will follow up with patient next week   Regina Eck, PharmD, [place] [phone]

## 2019-01-03 ENCOUNTER — Ambulatory Visit: Payer: Self-pay | Admitting: Pharmacist

## 2019-01-09 ENCOUNTER — Ambulatory Visit: Payer: Self-pay | Admitting: Pharmacist

## 2019-01-09 ENCOUNTER — Other Ambulatory Visit: Payer: Self-pay | Admitting: Pharmacist

## 2019-01-09 NOTE — Patient Outreach (Addendum)
[place] [facility]) Care Management  [place]   01/09/2019  [name]. [birth year] [id number]  Reason for referral: Medication Assistance, Medication Review, Medication Adherence  Current insurance: [employer]  PMHx includes but not limited to:  T2DM, HLD, HTN, Afib, CKD, HFpEF  Outreach:  Successful telephone call with [name] regarding medications.  HIPAA identifiers verified.  Patient states he received his new One Touch Ultra meter.  He states he is now using, however it is not larger than his last meter.  Encouraged patient to test blood sugars in the AM, as needed if feeling S/SX hypo/hyperglycemia and meal time.  He is now open to the idea of switching to an insulin pen.  Will need to have pen needles called into patient's pharmacy.  Message sent to CMA/PCP regarding this.  Patient has not receive free shipment of Basaglar insulin (pen) via [employer], however he has been approved for the remainder of the calendar year.   Objective: Lab Results  Component Value Date   CREATININE 1.76 (H) 10/09/2018   CREATININE 1.93 (H) 07/01/2018   CREATININE 1.87 (H) 05/20/2018    Lipid Panel     Component Value Date/Time   CHOL 171 05/06/2018 0636   TRIG 156 (H) 05/06/2018 0636   HDL 27 (L) 05/06/2018 0636   CHOLHDL 6.3 05/06/2018 0636   VLDL 31 05/06/2018 0636   LDLCALC CANCELED 09/13/2018 1702    BP Readings from Last 3 Encounters:  12/06/18 (!) 145/72  10/09/18 (!) 146/98  10/09/18 (!) 172/77    Allergies  Allergen Reactions  . Morphine And Related Anaphylaxis    "makes me stop breathing"  . Novocain [Procaine] Anaphylaxis  . Carbamazepine     shaking  . Gabapentin Other (See Comments)    Blood in urine  . Ivp Dye [Iodinated Diagnostic Agents]     Passing out  . Other     Steroids makes hearts race    Medications Reviewed Today    Reviewed by Bayard Hugger, NP (Nurse Practitioner) on 12/06/18 at 1551  Med List Status:  <None>  Medication Order Taking? Sig Documenting Provider Last Dose Status Informant  amiodarone (PACERONE) 100 MG tablet [id number] Yes TAKE 1 TABLET BY MOUTH EVERY DAY Georgiana Shore, NP Taking Active   apixaban (ELIQUIS) 5 MG TABS tablet [id number] Yes Take 1 tablet (5 mg total) by mouth 2 (two) times daily. [name], MD Taking Active   artificial tears (LACRILUBE) OINT ophthalmic ointment [id number] Yes Place 1 application into both eyes at bedtime as needed for dry eyes.  [provider] Taking Active Spouse/Significant Other  carvedilol (COREG) 12.5 MG tablet [id number] Yes Take 1 tablet (12.5 mg total) by mouth 2 (two) times daily with a meal. Agyei, Caprice Kluver, MD Taking Active   Hydroactive Dressings (DUODERM HYDROACTIVE) GEL [id number] Yes duoderm or generic equivalent gel dressing. 4cmx4cm. Apply to wound q 3 days until healed. Caren Macadam, MD Taking Active   HYDROcodone-acetaminophen [facility]) 10-325 MG tablet [id number] Yes TAKE 1 TABLET BY MOUTH EVERY 6 HOURS AS NEEDED FOR PAIN MUST LAST 30 DAYS Bayard Hugger, NP  Active   Insulin Glargine (BASAGLAR KWIKPEN) 100 UNIT/ML SOPN [id number] Yes Inject 0.45 mLs (45 Units total) into the skin at bedtime. Caren Macadam, MD Taking Active   insulin glargine (LANTUS) 100 UNIT/ML injection [id number] Yes Inject 45 Units into the skin daily. 2 hours after eating supper [provider] Taking Active  isosorbide mononitrate (IMDUR) 30 MG 24 hr tablet [id number] Yes Take 1 tablet (30 mg total) by mouth daily. Georgiana Shore, NP Taking Active   nystatin (MYCOSTATIN/NYSTOP) powder [id number] Yes APPLY  1  GRAM TOPICALLY TWICE DAILY AS NEEDED Meredith Staggers, MD Taking Active Spouse/Significant Other  potassium chloride SA (K-DUR,KLOR-CON) 20 MEQ tablet [id number] Yes Take 40 mEq by mouth 2 (two) times daily.  [provider] Taking Active   sucralfate (CARAFATE) 1 g tablet [id number] Yes Take 1 tablet (1 g total) by  mouth 4 (four) times daily -  with meals and at bedtime. Kelvin Cellar, MD Taking Active Spouse/Significant Other  tiZANidine (ZANAFLEX) 4 MG tablet [id number] Yes 1 tablet four times daily as needed for muscle spasms Meredith Staggers, MD Taking Active   topiramate (TOPAMAX) 100 MG tablet [id number] Yes Take 1 tablet (100 mg total) by mouth at bedtime. Meredith Staggers, MD Taking Active   torsemide (DEMADEX) 20 MG tablet [id number] Yes TAKE 3 TABLETS (60 MG TOTAL) BY MOUTH 2 TIMES DAILY. [name], MD Taking Active           Plan: . Updates routed to [place], Etter Sjogren  . Message sent to PCP/CMA regarding new RX for pen needles . Will f/u with patient next week.  Should have A1c checked. Last A1c 10.9 in 05/2018  Regina Eck, PharmD, [place]  [phone]

## 2019-01-10 ENCOUNTER — Ambulatory Visit: Payer: Self-pay | Admitting: Pharmacist

## 2019-01-15 ENCOUNTER — Other Ambulatory Visit: Payer: Self-pay | Admitting: Family Medicine

## 2019-01-15 MED ORDER — INSULIN PEN NEEDLE 31G X 8 MM MISC: 1.0000 [IU] | Freq: Two times a day (BID) | 5 refills | Status: DC

## 2019-01-16 ENCOUNTER — Other Ambulatory Visit: Payer: Self-pay | Admitting: Pharmacist

## 2019-01-16 ENCOUNTER — Telehealth: Payer: Self-pay | Admitting: *Deleted

## 2019-01-16 NOTE — Patient Outreach (Signed)
[facility]) Care Management [place]  01/16/2019  [place] Hijazi. [birth year] [id number]  Reason for referral: medication assistance/management  Successful call to patient regarding new insulin.  Pen needles have been called in for patient to pick up to use with new [employer].  [facility]N CPhT, Etter Sjogren will call [employer] tomorrow to confirm patient's insulin delivery. Will continue to follow for smooth transition to [employer].  PLAN: -I will follow up with patient next week   Regina Eck, PharmD, [place]  [phone]

## 2019-01-16 NOTE — Telephone Encounter (Signed)
-----   Message from Lavera Guise, [facility] sent at 01/15/2019 10:39 AM EDT ----- Regarding: new RX for pen needles request Hello!  We are assisting patient with medication assistance for insulin (now in "donut hole").  He will receive Basaglar insulin pens (insulin glargine) this week.    Can you please call in an RX for:  BD 31 gauge ultra fine short pen needles [facility] [address] [facility]patient's preferred [facility] in chart)  Thank you! Regina Eck, PharmD, [place]  [phone]

## 2019-01-17 ENCOUNTER — Other Ambulatory Visit: Payer: Self-pay | Admitting: Pharmacy Technician

## 2019-01-17 NOTE — Patient Outreach (Signed)
[place] [facility]) Care Management  01/17/2019  [name]. 1960/11/22 [id number]   Follow up call to [facility]) in regards to shipping status of patients [place]. Aldona Bar states that script needs to be resent in.  Faxed script back into [facility], will follow up in 2-3 business days to check for update.  Maud Deed Chana Bode [place] Certified Pharmacy Technician [place] Management Direct [phone]

## 2019-01-24 ENCOUNTER — Other Ambulatory Visit (HOSPITAL_COMMUNITY): Payer: Self-pay | Admitting: Cardiology

## 2019-01-29 DIAGNOSIS — I5042 Chronic combined systolic (congestive) and diastolic (congestive) heart failure: Secondary | ICD-10-CM | POA: Diagnosis not present

## 2019-01-30 ENCOUNTER — Ambulatory Visit: Payer: Self-pay

## 2019-01-31 ENCOUNTER — Other Ambulatory Visit: Payer: Self-pay | Admitting: Pharmacist

## 2019-02-05 ENCOUNTER — Other Ambulatory Visit: Payer: Self-pay

## 2019-02-05 ENCOUNTER — Other Ambulatory Visit (HOSPITAL_COMMUNITY): Payer: Self-pay | Admitting: Cardiology

## 2019-02-05 NOTE — Patient Outreach (Addendum)
[facility]) Care Management Chronic Special Needs Program  02/05/2019  Name: Hiroshi Krummel. DOB: Sep 14, 1960  MRN: [id number]  Mr. Keyontay Stolz is enrolled in a chronic special needs plan for Diabetes. Reviewed and updated care plan.  Subjective:Client reports his is doing well. He states he has an appointment with cardiologist on tomorrow. He also reports an appointment scheduled with the pain clinic on 02/06/2019. Client states he continues to use oxygen 24/7. Client reports concern regarding the "corono virus" and states he doesn't want to doctor visits unless he has to. Client reports he continues to check his blood sugar once a day at night before he goes to bed and states his blood sugar is usually around 200.  Client reports he has received insulin Kwikpen but has not received needles to go with the pen.  Goals Addressed            This Visit's Progress   . Client understands the importance of follow-up with providers by attending scheduled visits   On track    Call to schedule follow up visit with primary care doctor.  Please follow up with your primary care doctor and ask: 1) What is your Target Hemoglobin A1C? 2) What is your Target Blood sugar range? 3) How often are your supposed to be checking your blood sugar?    . Client verbalize knowledge of Heart Failure disease self management skills within the next 68months.       Attend follow up visits with cardiologist as scheduled. Take Medications as prescribed. Monitor Salt intake and Fluid Volume as recommended.    . Client will not report change from baseline and no repeated symptoms of stroke with in the next 9 months    On track   . Client will report abillity to obtain Medications within 6 months.   On track   . Client will report no worsening of symptoms of Atrial Fibrillation within the next 6 months   On track   . Client will report no worsening of symptoms related to heart disease within  the next 9 months    On track   . Client will use Assistive Devices as needed and verbalize understanding of device use   On track   . Client will verbalize knowledge of chronic lung disease as evidenced by no ED visits or Inpatient stays related to chronic lung disease    On track   . Client will verbalize knowledge of self management of Hypertension as evidences by BP reading of 140/90 or less; or as defined by provider   On track   . Maintain timely refills of diabetic medication as prescribed within the year .   On track   . Obtain annual  Lipid Profile, LDL-C   On track   . Obtain Annual Eye (retinal)  Exam    On track   . Obtain Annual Foot Exam   On track   . COMPLETED: Obtain annual screen for micro albuminuria (urine) , nephropathy (kidney problems)   On track    Done 09/13/2018    . Obtain Hemoglobin A1C at least 2 times per year   On track   . Visit Primary Care Provider or Endocrinologist at least 2 times per year    On track     Discussed COVID-19 precautions. RNCM reinforced the 24 hour nurse advice line. Client encouraged to call RNCM as needed/Confirmed he has contact number. Encouraged client to follow up with primary care.  Plan: RNCM will follow up with [facility] pharmacist, outreach to client in 3 months.     Thea Silversmith, RN, MSN, [facility] [phone]   .

## 2019-02-06 ENCOUNTER — Encounter (HOSPITAL_COMMUNITY): Payer: Self-pay

## 2019-02-06 ENCOUNTER — Other Ambulatory Visit: Payer: Self-pay

## 2019-02-06 ENCOUNTER — Ambulatory Visit (HOSPITAL_COMMUNITY)
Admission: RE | Admit: 2019-02-06 | Discharge: 2019-02-06 | Disposition: A | Discharge status: Home / Self Care | Payer: HMO | Source: Ambulatory Visit | Attending: Cardiology | Admitting: Cardiology

## 2019-02-06 VITALS — BP 158/76 | HR 81 | Wt 388.0 lb

## 2019-02-06 DIAGNOSIS — I255 Ischemic cardiomyopathy: Secondary | ICD-10-CM | POA: Insufficient documentation

## 2019-02-06 DIAGNOSIS — E669 Obesity, unspecified: Secondary | ICD-10-CM | POA: Insufficient documentation

## 2019-02-06 DIAGNOSIS — M797 Fibromyalgia: Secondary | ICD-10-CM | POA: Diagnosis not present

## 2019-02-06 DIAGNOSIS — Z794 Long term (current) use of insulin: Secondary | ICD-10-CM | POA: Insufficient documentation

## 2019-02-06 DIAGNOSIS — N183 Chronic kidney disease, stage 3 unspecified: Secondary | ICD-10-CM

## 2019-02-06 DIAGNOSIS — Z6841 Body Mass Index (BMI) 40.0 and over, adult: Secondary | ICD-10-CM | POA: Diagnosis not present

## 2019-02-06 DIAGNOSIS — Z87442 Personal history of urinary calculi: Secondary | ICD-10-CM | POA: Diagnosis not present

## 2019-02-06 DIAGNOSIS — I10 Essential (primary) hypertension: Secondary | ICD-10-CM | POA: Diagnosis not present

## 2019-02-06 DIAGNOSIS — I5032 Chronic diastolic (congestive) heart failure: Secondary | ICD-10-CM | POA: Insufficient documentation

## 2019-02-06 DIAGNOSIS — Z8249 Family history of ischemic heart disease and other diseases of the circulatory system: Secondary | ICD-10-CM | POA: Diagnosis not present

## 2019-02-06 DIAGNOSIS — E1122 Type 2 diabetes mellitus with diabetic chronic kidney disease: Secondary | ICD-10-CM | POA: Diagnosis not present

## 2019-02-06 DIAGNOSIS — Z79899 Other long term (current) drug therapy: Secondary | ICD-10-CM | POA: Diagnosis not present

## 2019-02-06 DIAGNOSIS — Z951 Presence of aortocoronary bypass graft: Secondary | ICD-10-CM | POA: Diagnosis not present

## 2019-02-06 DIAGNOSIS — Z7901 Long term (current) use of anticoagulants: Secondary | ICD-10-CM | POA: Insufficient documentation

## 2019-02-06 DIAGNOSIS — Z8673 Personal history of transient ischemic attack (TIA), and cerebral infarction without residual deficits: Secondary | ICD-10-CM | POA: Insufficient documentation

## 2019-02-06 DIAGNOSIS — I252 Old myocardial infarction: Secondary | ICD-10-CM | POA: Diagnosis not present

## 2019-02-06 DIAGNOSIS — I130 Hypertensive heart and chronic kidney disease with heart failure and stage 1 through stage 4 chronic kidney disease, or unspecified chronic kidney disease: Secondary | ICD-10-CM | POA: Diagnosis not present

## 2019-02-06 DIAGNOSIS — Z87891 Personal history of nicotine dependence: Secondary | ICD-10-CM | POA: Insufficient documentation

## 2019-02-06 DIAGNOSIS — I2510 Atherosclerotic heart disease of native coronary artery without angina pectoris: Secondary | ICD-10-CM | POA: Insufficient documentation

## 2019-02-06 DIAGNOSIS — M109 Gout, unspecified: Secondary | ICD-10-CM | POA: Diagnosis not present

## 2019-02-06 DIAGNOSIS — Z9981 Dependence on supplemental oxygen: Secondary | ICD-10-CM | POA: Insufficient documentation

## 2019-02-06 DIAGNOSIS — I480 Paroxysmal atrial fibrillation: Secondary | ICD-10-CM | POA: Insufficient documentation

## 2019-02-06 LAB — CBC
HCT: 49 % (ref 39.0–52.0)
Hemoglobin: 14.6 g/dL (ref 13.0–17.0)
MCH: 30.2 pg (ref 26.0–34.0)
MCHC: 29.8 g/dL — ABNORMAL LOW (ref 30.0–36.0)
MCV: 101.4 fL — ABNORMAL HIGH (ref 80.0–100.0)
Platelets: 179 10*3/uL (ref 150–400)
RBC: 4.83 MIL/uL (ref 4.22–5.81)
RDW: 12.4 % (ref 11.5–15.5)
WBC: 7.6 10*3/uL (ref 4.0–10.5)
nRBC: 0 % (ref 0.0–0.2)

## 2019-02-06 LAB — COMPREHENSIVE METABOLIC PANEL
ALT: 39 U/L (ref 0–44)
AST: 31 U/L (ref 15–41)
Albumin: 3.8 g/dL (ref 3.5–5.0)
Alkaline Phosphatase: 244 U/L — ABNORMAL HIGH (ref 38–126)
Anion gap: 15 (ref 5–15)
BUN: 23 mg/dL — ABNORMAL HIGH (ref 6–20)
CO2: 29 mmol/L (ref 22–32)
Calcium: 9.2 mg/dL (ref 8.9–10.3)
Chloride: 96 mmol/L — ABNORMAL LOW (ref 98–111)
Creatinine, Ser: 1.62 mg/dL — ABNORMAL HIGH (ref 0.61–1.24)
GFR calc Af Amer: 54 mL/min — ABNORMAL LOW (ref >60)
GFR calc non Af Amer: 46 mL/min — ABNORMAL LOW (ref >60)
Glucose, Bld: 308 mg/dL — ABNORMAL HIGH (ref 70–99)
Potassium: 4 mmol/L (ref 3.5–5.1)
Sodium: 140 mmol/L (ref 135–145)
Total Bilirubin: 0.6 mg/dL (ref 0.3–1.2)
Total Protein: 7.6 g/dL (ref 6.5–8.1)

## 2019-02-06 LAB — HEMOGLOBIN A1C
Hgb A1c MFr Bld: 8.7 % — ABNORMAL HIGH (ref 4.8–5.6)
Mean Plasma Glucose: 202.99 mg/dL

## 2019-02-06 LAB — TSH: TSH: 0.952 u[IU]/mL (ref 0.350–4.500)

## 2019-02-06 MED ORDER — CARVEDILOL 12.5 MG PO TABS: 12.5000 mg | ORAL_TABLET | Freq: Two times a day (BID) | ORAL | 11 refills | Status: AC

## 2019-02-06 MED ORDER — AMIODARONE HCL 100 MG PO TABS: 100.0000 mg | ORAL_TABLET | Freq: Every day | ORAL | 11 refills | Status: AC

## 2019-02-06 MED ORDER — POTASSIUM CHLORIDE CRYS ER 20 MEQ PO TBCR: 40.0000 meq | EXTENDED_RELEASE_TABLET | Freq: Two times a day (BID) | ORAL | 11 refills | Status: AC

## 2019-02-06 MED ORDER — ISOSORBIDE MONONITRATE ER 30 MG PO TB24: 30.0000 mg | ORAL_TABLET | Freq: Every day | ORAL | 11 refills | Status: AC

## 2019-02-06 MED ORDER — APIXABAN 5 MG PO TABS: 5.0000 mg | ORAL_TABLET | Freq: Two times a day (BID) | ORAL | 11 refills | Status: AC

## 2019-02-06 NOTE — Patient Instructions (Signed)
No medication changes today!!  All heart medications were refilled today!!  Labs today We will only contact you if something comes back abnormal or we need to make some changes. Otherwise no news is good news!  Your physician recommends that you schedule a follow-up appointment in: 6-9 months, you will be called in the fall to schedule this appointment with the Nurse Practitioner.

## 2019-02-06 NOTE — Progress Notes (Signed)
PCP: Dr Butcher/Dr Koberlein  Primary Cardiologist: Dr McLean   HPI: 57 yo with history of CAD s/p CABG, chronic diastolic CHF, atrial fibrillation, obesity, prior CVA s/p acoustic neuroma surgery, HTN,  diabetes, and PAF.   He had a single vessel CABG with LIMA-LAD in 2009.  In 6/18, he was in the hospital with diastolic CHF and atrial fibrillation with RVR.  He was started on Eliquis that time but not cardioverted.  After leaving the hospital, he did not continue apixaban and did not have cardiology followup.   Admitted 04/30/2018  with increased dyspnea and mental status changes. He was thought to have acute metobolic encephalopathy with hypercapneic respiratory failure.  Creatinine initially 4.19, has decreased to 2.15.  Last prior creatinine was 1.4 in 6/18.  He was on Bipap initially. He has been in atrial fibrillation with RVR, was started on Coreg but HR still running high. ECHO was nondiagnostic so TEE completed with cardioversion. He was placed amiodarone. Diuresed with IV lasix and later transitioned to torsemide 40 mg twice a day. He was discharged to [facility].   Today he returns for HF follow up. Overall feeling fine. Says he has ongoing pain issues. Requires assistance with ADLs. Able to walk a little in his house. SOB with exertion. He remains on 2 liters. Oxygen. Denies PND/Orthopnea. No chest pain. Appetite ok. No fever or chills. No bleeding issues. Unable to stand to weigh. Taking all medications.  Review of systems complete and found to be negative unless listed in HPI.   SH:  Social History   Socioeconomic History  . Marital status: Married    Spouse name: Not on file  . Number of children: Not on file  . Years of education: Not on file  . Highest education level: Not on file  Occupational History  . Not on file  Social Needs  . Financial resource strain: Not on file  . Food insecurity    Worry: Not on file    Inability: Not on file  . Transportation needs     Medical: Not on file    Non-medical: Not on file  Tobacco Use  . Smoking status: Former Smoker    Packs/day: 0.25    Years: 20.00    Pack years: 5.00    Types: Cigarettes  . Smokeless tobacco: Current User  . Tobacco comment: Rare tobacco.  "[name]".  Substance and Sexual Activity  . Alcohol use: No    Alcohol/week: 0.0 standard drinks  . Drug use: No  . Sexual activity: Yes  Lifestyle  . Physical activity    Days per week: Not on file    Minutes per session: Not on file  . Stress: Not on file  Relationships  . Social connections    Talks on phone: Not on file    Gets together: Not on file    Attends religious service: Not on file    Active member of club or organization: Not on file    Attends meetings of clubs or organizations: Not on file    Relationship status: Not on file  . Intimate partner violence    Fear of current or ex partner: Not on file    Emotionally abused: Not on file    Physically abused: Not on file    Forced sexual activity: Not on file  Other Topics Concern  . Not on file  Social History Narrative  . Not on file    FH:    Family History  Problem Relation Age of Onset  . CAD Father   . Hypertension Mother   . Cancer Other        multiple relatives  . Diabetes Maternal Grandmother   . Diabetes Maternal Aunt   . Leukemia Cousin   . Leukemia Maternal Grandfather     Past Medical History:  Diagnosis Date  . Brain tumor (HCC)    Takes Topamax so patient will not have headaches  . Bruises easily   . CAD (coronary artery disease)    Cath 09, 99% LAD  . Cardiomyopathy, ischemic    Ischemic  . CHF (congestive heart failure) (HCC)   . Cyst near coccyx   . Cyst near tailbone   . Diabetes mellitus without complication (HCC)    borderline  . Edema of both legs    Takes Lasix  . Fibromyalgia   . Gout   . Headache(784.0)    with lights  . Hypertension    dr hochrein  . Kidney stones   . Myocardial infarction (HCC)   . Obesity    . Pneumonia    hx of  . Shortness of breath   . Stroke (HCC)    Affected vision, walking, and facial drooping on right face    Current Outpatient Medications  Medication Sig Dispense Refill  . amiodarone (PACERONE) 100 MG tablet TAKE 1 TABLET BY MOUTH EVERY DAY 30 tablet 3  . artificial tears (LACRILUBE) OINT ophthalmic ointment Place 1 application into both eyes at bedtime as needed for dry eyes.     . Blood Glucose Monitoring Suppl (ONE TOUCH ULTRA MINI) w/Device KIT Use as directed twice a day 1 each 0  . carvedilol (COREG) 12.5 MG tablet Take 1 tablet (12.5 mg total) by mouth 2 (two) times daily with a meal. 60 tablet 3  . ELIQUIS 5 MG TABS tablet TAKE 1 TABLET BY MOUTH TWICE A DAY 60 tablet 5  . Hydroactive Dressings (DUODERM HYDROACTIVE) GEL duoderm or generic equivalent gel dressing. 4cmx4cm. Apply to wound q 3 days until healed. 5 g 2  . HYDROcodone-acetaminophen (NORCO) 10-325 MG tablet TAKE 1 TABLET BY MOUTH EVERY 6 HOURS AS NEEDED FOR PAIN MUST LAST 30 DAYS 120 tablet 0  . Insulin Glargine (BASAGLAR KWIKPEN) 100 UNIT/ML SOPN Inject 0.45 mLs (45 Units total) into the skin at bedtime. 15 pen 3  . Insulin Pen Needle 31G X 8 MM MISC 1 Units by Does not apply route 2 (two) times a day. 100 each 5  . isosorbide mononitrate (IMDUR) 30 MG 24 hr tablet Take 1 tablet (30 mg total) by mouth daily. 30 tablet 6  . Lancets (ONETOUCH ULTRASOFT) lancets Use as instructed 100 each 12  . LANTUS 100 UNIT/ML injection INJECT 45 UNITS INTO THE SKIN AT BEDTIME FOR 120 DOSES 20 mL 3  . nystatin (MYCOSTATIN/NYSTOP) powder APPLY  1  GRAM TOPICALLY TWICE DAILY AS NEEDED 180 g 3  . ONE TOUCH ULTRA TEST test strip Use as instructed twice a day 100 each 12  . potassium chloride SA (K-DUR,KLOR-CON) 20 MEQ tablet Take 40 mEq by mouth 2 (two) times daily.     . sucralfate (CARAFATE) 1 g tablet Take 1 tablet (1 g total) by mouth 4 (four) times daily -  with meals and at bedtime. 30 tablet 0  . tiZANidine  (ZANAFLEX) 4 MG tablet 1 tablet four times daily as needed for muscle spasms 270 tablet 2  . topiramate (TOPAMAX) 100 MG tablet Take   1 tablet (100 mg total) by mouth at bedtime. 90 tablet 5  . torsemide (DEMADEX) 20 MG tablet TAKE 3 TABLETS (60 MG TOTAL) BY MOUTH 2 TIMES DAILY. 180 tablet 2   No current facility-administered medications for this encounter.     Vitals:   02/06/19 0956  BP: (!) 158/76  Pulse: 81  SpO2: 96%  Weight: (!) 176 kg (388 lb)   Filed Weights   02/06/19 0956  Weight: (!) 176 kg (388 lb)   Wt Readings from Last 3 Encounters:  02/06/19 (!) 176 kg (388 lb)  12/06/18 (!) 171 kg (377 lb)  10/09/18 (!) 173.9 kg (383 lb 6.4 oz)    PHYSICAL EXAM: General:  No resp difficulty. Appears chronically ill.  HEENT: Right facial droop and slurred speech Neck: supple. no JVD. Carotids 2+ bilat; no bruits. No lymphadenopathy or thryomegaly appreciated. Cor: PMI nondisplaced. Regular rate & rhythm. No rubs, gallops or murmurs. Lungs: clear on 2 liters oxygen.  Abdomen: obese, soft, nontender, nondistended. No hepatosplenomegaly. No bruits or masses. Good bowel sounds. Extremities: no cyanosis, clubbing, rash, edema Neuro: alert & orientedx3, cranial nerves grossly intact. moves all 4 extremities w/o difficulty. Affect pleasant  ASSESSMENT & PLAN: 1.  Chronic Diastolic CHF:  TEE 9/18 showed EF 55-60% with mildly dilated/mildly dysfunctional RV.  NYHA III chronically.  -Volume status stable despite weight gain. Continue torsemide 60 mg twice a day.  - Discussed limiting fluid intake to < 2 liters per day and low salt food choices.  -Check BMEt  2. PAF:  - S/P DC-CV 04/2018. Sound regular on exam.  - Continue amiodarone to 100 mg daily. Check TSH, LFTs, CBC today.  - Continue eliquis 5 mg twice a day.  3. CKD stage 3: Check BMET  4. CAD: S/p LIMA-LAD in 2009.  -No s/s ischemia.  - No ASA given Eliquis use.  - Continue rosuvastatin 20 mg 5. H/O metabolic  encephalopathy: -  Wearing O2 continuously. Refuses BiPAP. Not interested in sleep study. 6.  HTN  -BP stable at home. Suspect may be related to pain.  7. Obesity Body mass index is 55.67 kg/m.   Follow up in 6-9  Months. Refill cardiac meds today. Check labs as above and will check Hgb A1C for PCP to limit exposure to different labs.       NP-C  10:06 AM  

## 2019-02-07 ENCOUNTER — Encounter: Payer: HMO | Attending: Physical Medicine & Rehabilitation | Admitting: Registered Nurse

## 2019-02-07 ENCOUNTER — Encounter: Payer: Self-pay | Admitting: Registered Nurse

## 2019-02-07 VITALS — BP 157/89 | HR 79 | Temp 97.7°F | Ht 70.5 in | Wt 388.0 lb

## 2019-02-07 DIAGNOSIS — M109 Gout, unspecified: Secondary | ICD-10-CM | POA: Diagnosis not present

## 2019-02-07 DIAGNOSIS — Z809 Family history of malignant neoplasm, unspecified: Secondary | ICD-10-CM | POA: Insufficient documentation

## 2019-02-07 DIAGNOSIS — F17210 Nicotine dependence, cigarettes, uncomplicated: Secondary | ICD-10-CM | POA: Insufficient documentation

## 2019-02-07 DIAGNOSIS — M62838 Other muscle spasm: Secondary | ICD-10-CM | POA: Diagnosis not present

## 2019-02-07 DIAGNOSIS — I509 Heart failure, unspecified: Secondary | ICD-10-CM | POA: Diagnosis not present

## 2019-02-07 DIAGNOSIS — Z79891 Long term (current) use of opiate analgesic: Secondary | ICD-10-CM

## 2019-02-07 DIAGNOSIS — G894 Chronic pain syndrome: Secondary | ICD-10-CM | POA: Diagnosis not present

## 2019-02-07 DIAGNOSIS — Z87442 Personal history of urinary calculi: Secondary | ICD-10-CM | POA: Diagnosis not present

## 2019-02-07 DIAGNOSIS — I110 Hypertensive heart disease with heart failure: Secondary | ICD-10-CM | POA: Diagnosis not present

## 2019-02-07 DIAGNOSIS — Z8249 Family history of ischemic heart disease and other diseases of the circulatory system: Secondary | ICD-10-CM | POA: Diagnosis not present

## 2019-02-07 DIAGNOSIS — G8929 Other chronic pain: Secondary | ICD-10-CM | POA: Insufficient documentation

## 2019-02-07 DIAGNOSIS — Z8673 Personal history of transient ischemic attack (TIA), and cerebral infarction without residual deficits: Secondary | ICD-10-CM | POA: Insufficient documentation

## 2019-02-07 DIAGNOSIS — Z5181 Encounter for therapeutic drug level monitoring: Secondary | ICD-10-CM | POA: Diagnosis not present

## 2019-02-07 DIAGNOSIS — Z9889 Other specified postprocedural states: Secondary | ICD-10-CM | POA: Diagnosis not present

## 2019-02-07 DIAGNOSIS — G510 Bell's palsy: Secondary | ICD-10-CM | POA: Diagnosis not present

## 2019-02-07 DIAGNOSIS — E119 Type 2 diabetes mellitus without complications: Secondary | ICD-10-CM | POA: Insufficient documentation

## 2019-02-07 DIAGNOSIS — G629 Polyneuropathy, unspecified: Secondary | ICD-10-CM | POA: Insufficient documentation

## 2019-02-07 DIAGNOSIS — I2510 Atherosclerotic heart disease of native coronary artery without angina pectoris: Secondary | ICD-10-CM | POA: Insufficient documentation

## 2019-02-07 DIAGNOSIS — I255 Ischemic cardiomyopathy: Secondary | ICD-10-CM | POA: Insufficient documentation

## 2019-02-07 DIAGNOSIS — Z951 Presence of aortocoronary bypass graft: Secondary | ICD-10-CM | POA: Insufficient documentation

## 2019-02-07 DIAGNOSIS — Z760 Encounter for issue of repeat prescription: Secondary | ICD-10-CM | POA: Diagnosis not present

## 2019-02-07 DIAGNOSIS — I252 Old myocardial infarction: Secondary | ICD-10-CM | POA: Diagnosis not present

## 2019-02-07 DIAGNOSIS — G609 Hereditary and idiopathic neuropathy, unspecified: Secondary | ICD-10-CM | POA: Diagnosis not present

## 2019-02-07 DIAGNOSIS — R600 Localized edema: Secondary | ICD-10-CM | POA: Insufficient documentation

## 2019-02-07 DIAGNOSIS — R51 Headache: Secondary | ICD-10-CM | POA: Insufficient documentation

## 2019-02-07 DIAGNOSIS — Z930 Tracheostomy status: Secondary | ICD-10-CM | POA: Insufficient documentation

## 2019-02-07 DIAGNOSIS — M797 Fibromyalgia: Secondary | ICD-10-CM | POA: Insufficient documentation

## 2019-02-07 DIAGNOSIS — D333 Benign neoplasm of cranial nerves: Secondary | ICD-10-CM | POA: Insufficient documentation

## 2019-02-07 MED ORDER — HYDROCODONE-ACETAMINOPHEN 10-325 MG PO TABS: ORAL_TABLET | ORAL | 0 refills | Status: DC

## 2019-02-07 NOTE — Progress Notes (Signed)
Subjective:    Patient ID: [name]., male    DOB: 12/17/1960, 58 y.o.   MRN: [id number]  HPI: [name]. is a 58 y.o. male who returns for follow up appointment for chronic pain and medication refill. He states his pain is located in  His face ( right facial pain). He rates his pain 7. His current exercise regime is walking.  Mr. [name] Morphine equivalent is 40.00 MME.  Mr. [name] states " he believes the  the pharmacy did not give him his full prescription of hydrocodone, also reported he had hydrocodone when he was discharged from SNF and he had to use the  remaining of Hydrocodone he had. We reviewed the narcotic contract and was instructed to only take the medication he is prescribed. Also adamant he doesn't have any hydrocodone left from previous providers. He realizes if this occurs again he will be discharged from our office, he verbalizes understanding.   UDS ordered today.    Pain Inventory Average Pain 7 Pain Right Now 7 My pain is sharp  In the last 24 hours, has pain interfered with the following? General activity 6 Relation with others 0 Enjoyment of life 0 What TIME of day is your pain at its worst? night Sleep (in general) Fair  Pain is worse with: walking Pain improves with: medication Relief from Meds: n/a  Mobility use a walker ability to climb steps?  no do you drive?  no Do you have any goals in this area?  no  Function disabled: date disabled 2016 I need assistance with the following:  dressing and bathing  Neuro/Psych bladder control problems numbness tingling loss of taste or smell  Prior Studies n/a  Physicians involved in your care n/a   Family History  Problem Relation Age of Onset  . CAD Father   . Hypertension Mother   . Cancer Other        multiple relatives  . Diabetes Maternal Grandmother   . Diabetes Maternal Aunt   . Leukemia Cousin   . Leukemia Maternal Grandfather    Social History    Socioeconomic History  . Marital status: Married    Spouse name: Not on file  . Number of children: Not on file  . Years of education: Not on file  . Highest education level: Not on file  Occupational History  . Not on file  Social Needs  . Financial resource strain: Not on file  . Food insecurity    Worry: Not on file    Inability: Not on file  . Transportation needs    Medical: Not on file    Non-medical: Not on file  Tobacco Use  . Smoking status: Former Smoker    Packs/day: 0.25    Years: 20.00    Pack years: 5.00    Types: Cigarettes  . Smokeless tobacco: Current User  . Tobacco comment: Rare tobacco.  "[name]".  Substance and Sexual Activity  . Alcohol use: No    Alcohol/week: 0.0 standard drinks  . Drug use: No  . Sexual activity: Yes  Lifestyle  . Physical activity    Days per week: Not on file    Minutes per session: Not on file  . Stress: Not on file  Relationships  . Social Herbalist on phone: Not on file    Gets together: Not on file    Attends religious service: Not on file  Active member of club or organization: Not on file    Attends meetings of clubs or organizations: Not on file    Relationship status: Not on file  Other Topics Concern  . Not on file  Social History Narrative  . Not on file   Past Surgical History:  Procedure Laterality Date  . 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 ANAL FISTULECTOMY    . CARDIOVERSION N/A 05/08/2018   Procedure: CARDIOVERSION;  Surgeon: [name], MD;  Location: [facility] ENDOSCOPY;  Service: Cardiovascular;  Laterality: N/A;  . CORONARY ARTERY BYPASS GRAFT     LIMA to LAD 2009  . CRANIOTOMY Right 11/20/2012   Procedure: CRANIOTOMY REPAIR DURAL/CENTRAL SPINAL FLUID LEAK;  Surgeon: [name], MD;  Location: [place] NEURO ORS;  Service: Neurosurgery;   Laterality: Right;  . EYE SURGERY     to coorect lid and double vision  . MEDIAN RECTUS REPAIR Right 02/16/2016   Procedure: RIGHT LATERAL RECTUS RESECTION; SUPERIOR RECTUS RESECTION RIGHT EYE; RIGHT SUPERIOR RECTUS RECESSION; LATERAL TARSAL STRIP RIGHT LOWER EYELID;  Surgeon: [name], MD;  Location: [place];  Service: Ophthalmology;  Laterality: Right;  . PLACEMENT OF LUMBAR DRAIN N/A 11/20/2012   Procedure: Attempted PLACEMENT OF LUMBAR DRAIN;  Surgeon: [name], MD;  Location: [place] NEURO ORS;  Service: Neurosurgery;  Laterality: N/A;  . RETROSIGMOID CRANIECTOMY FOR TUMOR RESECTION Right 10/11/2012   Procedure: RETROSIGMOID CRANIECTOMY FOR TUMOR RESECTION;  Surgeon: [name], MD;  Location: [place] NEURO ORS;  Service: Neurosurgery;  Laterality: Right;  Craniotomy for acoustic neuroma  . TEE WITHOUT CARDIOVERSION N/A 05/08/2018   Procedure: TRANSESOPHAGEAL ECHOCARDIOGRAM (TEE);  Surgeon: [name], MD;  Location: [facility] ENDOSCOPY;  Service: Cardiovascular;  Laterality: N/A;  . TRACHEOSTOMY TUBE PLACEMENT N/A 10/11/2012   Procedure: TRACHEOSTOMY;  Surgeon: Ascencion Dike, MD;  Location: [facility] NEURO ORS;  Service: ENT;  Laterality: N/A;  . UMBILICAL HERNIA REPAIR     Past Medical History:  Diagnosis Date  . Brain tumor ([place])    Takes Topamax so patient will not have headaches  . Bruises easily   . CAD (coronary artery disease)    Cath 09, 99% LAD  . Cardiomyopathy, ischemic    Ischemic  . CHF (congestive heart failure) ([place])   . Cyst near coccyx   . Cyst near tailbone   . Diabetes mellitus without complication (HCC)    borderline  . Edema of both legs    Takes Lasix  . Fibromyalgia   . Gout   . Headache(784.0)    with lights  . Hypertension    dr Percival Spanish  . Kidney stones   . Myocardial infarction ([place])   . Obesity   . Pneumonia    hx of  . Shortness of breath   . Stroke [facility])    Affected vision, walking, and facial drooping on right face   Pulse 80   Temp 97.7 F  (36.5 C)   Ht 5' 10.5" (1.791 m)   Wt (!) 388 lb (176 kg)   SpO2 95%   BMI 54.89 kg/m   Opioid Risk Score:   Fall Risk Score:  `1  Depression screen PHQ 2/9  Depression screen [facility] 2/9 10/09/2018 09/23/2018 08/19/2018 05/31/2018 11/09/2017 09/10/2017 07/16/2017  Decreased Interest 0 0 1 1 1 1  0  Down, Depressed, Hopeless 0 0 1  1 1 1  0  PHQ - 2 Score 0 0 2 2 2 2  0  Altered sleeping - - - - - - -  Tired, decreased energy - - - - - - -  Change in appetite - - - - - - -  Feeling bad or failure about yourself  - - - - - - -  Trouble concentrating - - - - - - -  Moving slowly or fidgety/restless - - - - - - -  Suicidal thoughts - - - - - - -  PHQ-9 Score - - - - - - -      Review of Systems     Objective:   Physical Exam Vitals signs and nursing note reviewed.  Constitutional:      Appearance: Normal appearance. He is obese.  Neck:     Musculoskeletal: Normal range of motion and neck supple.  Cardiovascular:     Rate and Rhythm: Normal rate and regular rhythm.     Pulses: Normal pulses.     Heart sounds: Normal heart sounds.  Pulmonary:     Effort: Pulmonary effort is normal.     Breath sounds: Normal breath sounds.     Comments: Continuous Oxygen 2 liters nasal cannula Musculoskeletal:     Comments: Normal Muscle Bulk and Muscle Testing Reveals:  Upper Extremities: Full ROM and Muscle Strength 5/5  Lower Extremities: Full ROM and Muscle Strength 5/5 Arises from table slowly Antalgic Gait   Skin:    General: Skin is warm and dry.  Neurological:     Mental Status: He is alert and oriented to person, place, and time.  Psychiatric:        Mood and Affect: Mood normal.        Behavior: Behavior normal.           Assessment & Plan:  1. 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 S/P RIGHT LATERAL RECTUS RESECTION; SUPERIOR RECTUS RESECTION RIGHT EYE; RIGHT SUPERIOR RECTUS RECESSION;  LATERAL TARSAL STRIP RIGHT LOWER EYELID with Dr. Frederico Hamman on 02/16/16. Refilled: Hydrocodone 10/325mg  one tablet every 6 hours as needed #120. Second scripte-scribefor the following month.06//19/2020 We will continue the opioid monitoring program, this consists of regular clinic visits, examinations, urine drug screen, pill counts as well as use of [place] Controlled Substance Reporting System. Continue eye ointment as ordered. 2. Headaches right temporal area related to tumor/surgery.No complaints today.Continue Topamax.02/07/2019 3. Lower extremity edema, morbid obesity. Continue to Monitor. PCP Following. 02/07/2019 4. Muscle Spasm: Continue Tizanidine.02/07/2019 5. Peripheral Neuropathy:Continue Topamax.Continue to Monitor. 02/07/2019  F/U in 2 months   15 minutes of face to face patient care time was spent during this visit. All questions were encouraged and answered.

## 2019-02-10 ENCOUNTER — Other Ambulatory Visit: Payer: Self-pay | Admitting: Family Medicine

## 2019-02-10 MED ORDER — INSULIN PEN NEEDLE 31G X 8 MM MISC: 1.0000 [IU] | Freq: Two times a day (BID) | 5 refills | Status: DC

## 2019-02-10 NOTE — Patient Outreach (Signed)
[facility]) Care Management [place]  02/10/2019  [place] [birth year] [id number]  Reason for referral: medication assistance/management  Successful call to patient regarding new insulin.   Patient states he has not received pen needles for new Basaglar.  RX was electronically sent by MD on 01/16/19, however [facility] states that they do not have a RX.  Message routed to [facility] and PCP to re-send again.  Patient is using up his Lantus supply in the meantime.    Counseled patient on diet.  He has been consuming fruits with high carb/sugar content.  Discussed alternatives and health snack options. Will continue to follow for smooth transition to [employer].  PLAN: -I will follow up with patient next week   Regina Eck, PharmD, [place]  [phone]

## 2019-02-11 ENCOUNTER — Telehealth: Payer: Self-pay | Admitting: *Deleted

## 2019-02-11 MED ORDER — INSULIN PEN NEEDLE 31G X 8 MM MISC: 1.0000 [IU] | Freq: Two times a day (BID) | 5 refills | Status: AC

## 2019-02-11 NOTE — Telephone Encounter (Signed)
Rx sent to [facility]

## 2019-02-11 NOTE — Telephone Encounter (Signed)
-----   Message from Lavera Guise, [facility] sent at 02/10/2019 11:09 AM EDT ----- Regarding: pen needles/new basaglar Hello!  Patient has received free [employer] in the mail.  He is still using the remainder of his Lantus until gone.  I called [facility] and they state they did not receive pen needle RX.  I'm not sure what happened because I see that you sent RX on 01/16/19.  Could you please resend RX for pen needles (RX listed in chart)?  Thank you for your time! Regina Eck, PharmD, [place]  [phone]

## 2019-02-12 LAB — TOXASSURE SELECT,+ANTIDEPR,UR

## 2019-02-13 ENCOUNTER — Telehealth: Payer: Self-pay | Admitting: *Deleted

## 2019-02-13 NOTE — Telephone Encounter (Signed)
Urine drug screen for this encounter is consistent for prescribed medication 

## 2019-02-17 ENCOUNTER — Ambulatory Visit: Payer: Self-pay | Admitting: Pharmacist

## 2019-02-22 ENCOUNTER — Inpatient Hospital Stay (HOSPITAL_COMMUNITY)
Admission: EM | Admit: 2019-02-22 | Discharge: 2019-02-27 | DRG: 189 | Disposition: A | Discharge status: Home Health | Payer: HMO | Attending: Internal Medicine | Admitting: Internal Medicine

## 2019-02-22 ENCOUNTER — Other Ambulatory Visit: Payer: Self-pay

## 2019-02-22 ENCOUNTER — Emergency Department (HOSPITAL_COMMUNITY): Payer: HMO

## 2019-02-22 ENCOUNTER — Encounter (HOSPITAL_COMMUNITY): Payer: Self-pay | Admitting: Emergency Medicine

## 2019-02-22 ENCOUNTER — Inpatient Hospital Stay (HOSPITAL_COMMUNITY): Payer: HMO

## 2019-02-22 DIAGNOSIS — G894 Chronic pain syndrome: Secondary | ICD-10-CM | POA: Diagnosis not present

## 2019-02-22 DIAGNOSIS — J9602 Acute respiratory failure with hypercapnia: Secondary | ICD-10-CM

## 2019-02-22 DIAGNOSIS — Z794 Long term (current) use of insulin: Secondary | ICD-10-CM

## 2019-02-22 DIAGNOSIS — I25810 Atherosclerosis of coronary artery bypass graft(s) without angina pectoris: Secondary | ICD-10-CM | POA: Diagnosis not present

## 2019-02-22 DIAGNOSIS — E872 Acidosis: Secondary | ICD-10-CM | POA: Diagnosis present

## 2019-02-22 DIAGNOSIS — R51 Headache: Secondary | ICD-10-CM

## 2019-02-22 DIAGNOSIS — E662 Morbid (severe) obesity with alveolar hypoventilation: Secondary | ICD-10-CM | POA: Diagnosis not present

## 2019-02-22 DIAGNOSIS — Z8673 Personal history of transient ischemic attack (TIA), and cerebral infarction without residual deficits: Secondary | ICD-10-CM

## 2019-02-22 DIAGNOSIS — E1169 Type 2 diabetes mellitus with other specified complication: Secondary | ICD-10-CM

## 2019-02-22 DIAGNOSIS — Z9889 Other specified postprocedural states: Secondary | ICD-10-CM

## 2019-02-22 DIAGNOSIS — Z720 Tobacco use: Secondary | ICD-10-CM

## 2019-02-22 DIAGNOSIS — R062 Wheezing: Secondary | ICD-10-CM | POA: Diagnosis not present

## 2019-02-22 DIAGNOSIS — R29810 Facial weakness: Secondary | ICD-10-CM | POA: Diagnosis not present

## 2019-02-22 DIAGNOSIS — I5031 Acute diastolic (congestive) heart failure: Secondary | ICD-10-CM

## 2019-02-22 DIAGNOSIS — H918X1 Other specified hearing loss, right ear: Secondary | ICD-10-CM | POA: Diagnosis present

## 2019-02-22 DIAGNOSIS — E1122 Type 2 diabetes mellitus with diabetic chronic kidney disease: Secondary | ICD-10-CM | POA: Diagnosis present

## 2019-02-22 DIAGNOSIS — J9622 Acute and chronic respiratory failure with hypercapnia: Secondary | ICD-10-CM | POA: Diagnosis not present

## 2019-02-22 DIAGNOSIS — R400 Somnolence: Secondary | ICD-10-CM

## 2019-02-22 DIAGNOSIS — J441 Chronic obstructive pulmonary disease with (acute) exacerbation: Secondary | ICD-10-CM | POA: Diagnosis not present

## 2019-02-22 DIAGNOSIS — I5032 Chronic diastolic (congestive) heart failure: Secondary | ICD-10-CM

## 2019-02-22 DIAGNOSIS — D539 Nutritional anemia, unspecified: Secondary | ICD-10-CM | POA: Diagnosis present

## 2019-02-22 DIAGNOSIS — N171 Acute kidney failure with acute cortical necrosis: Secondary | ICD-10-CM | POA: Diagnosis not present

## 2019-02-22 DIAGNOSIS — G939 Disorder of brain, unspecified: Secondary | ICD-10-CM

## 2019-02-22 DIAGNOSIS — Z209 Contact with and (suspected) exposure to unspecified communicable disease: Secondary | ICD-10-CM | POA: Diagnosis not present

## 2019-02-22 DIAGNOSIS — Z7901 Long term (current) use of anticoagulants: Secondary | ICD-10-CM

## 2019-02-22 DIAGNOSIS — T82218D Other mechanical complication of coronary artery bypass graft, subsequent encounter: Secondary | ICD-10-CM

## 2019-02-22 DIAGNOSIS — I480 Paroxysmal atrial fibrillation: Secondary | ICD-10-CM | POA: Diagnosis not present

## 2019-02-22 DIAGNOSIS — Z9119 Patient's noncompliance with other medical treatment and regimen: Secondary | ICD-10-CM

## 2019-02-22 DIAGNOSIS — I25700 Atherosclerosis of coronary artery bypass graft(s), unspecified, with unstable angina pectoris: Secondary | ICD-10-CM

## 2019-02-22 DIAGNOSIS — E119 Type 2 diabetes mellitus without complications: Secondary | ICD-10-CM | POA: Diagnosis not present

## 2019-02-22 DIAGNOSIS — G4733 Obstructive sleep apnea (adult) (pediatric): Secondary | ICD-10-CM

## 2019-02-22 DIAGNOSIS — E1151 Type 2 diabetes mellitus with diabetic peripheral angiopathy without gangrene: Secondary | ICD-10-CM | POA: Diagnosis not present

## 2019-02-22 DIAGNOSIS — R0902 Hypoxemia: Secondary | ICD-10-CM | POA: Diagnosis not present

## 2019-02-22 DIAGNOSIS — J9601 Acute respiratory failure with hypoxia: Secondary | ICD-10-CM

## 2019-02-22 DIAGNOSIS — E8729 Other acidosis: Secondary | ICD-10-CM

## 2019-02-22 DIAGNOSIS — Z6841 Body Mass Index (BMI) 40.0 and over, adult: Secondary | ICD-10-CM | POA: Diagnosis not present

## 2019-02-22 DIAGNOSIS — N183 Chronic kidney disease, stage 3 unspecified: Secondary | ICD-10-CM | POA: Diagnosis present

## 2019-02-22 DIAGNOSIS — Z20828 Contact with and (suspected) exposure to other viral communicable diseases: Secondary | ICD-10-CM | POA: Diagnosis present

## 2019-02-22 DIAGNOSIS — R0602 Shortness of breath: Secondary | ICD-10-CM

## 2019-02-22 DIAGNOSIS — I5033 Acute on chronic diastolic (congestive) heart failure: Secondary | ICD-10-CM | POA: Diagnosis present

## 2019-02-22 DIAGNOSIS — Z86011 Personal history of benign neoplasm of the brain: Secondary | ICD-10-CM | POA: Diagnosis not present

## 2019-02-22 DIAGNOSIS — Z8249 Family history of ischemic heart disease and other diseases of the circulatory system: Secondary | ICD-10-CM

## 2019-02-22 DIAGNOSIS — M109 Gout, unspecified: Secondary | ICD-10-CM | POA: Diagnosis present

## 2019-02-22 DIAGNOSIS — I130 Hypertensive heart and chronic kidney disease with heart failure and stage 1 through stage 4 chronic kidney disease, or unspecified chronic kidney disease: Secondary | ICD-10-CM | POA: Diagnosis present

## 2019-02-22 DIAGNOSIS — J9621 Acute and chronic respiratory failure with hypoxia: Secondary | ICD-10-CM | POA: Diagnosis not present

## 2019-02-22 DIAGNOSIS — R627 Adult failure to thrive: Secondary | ICD-10-CM | POA: Diagnosis not present

## 2019-02-22 DIAGNOSIS — G518 Other disorders of facial nerve: Secondary | ICD-10-CM | POA: Diagnosis present

## 2019-02-22 DIAGNOSIS — I252 Old myocardial infarction: Secondary | ICD-10-CM

## 2019-02-22 DIAGNOSIS — E66813 Obesity, class 3: Secondary | ICD-10-CM

## 2019-02-22 DIAGNOSIS — Z9981 Dependence on supplemental oxygen: Secondary | ICD-10-CM

## 2019-02-22 DIAGNOSIS — M797 Fibromyalgia: Secondary | ICD-10-CM | POA: Diagnosis present

## 2019-02-22 DIAGNOSIS — E1165 Type 2 diabetes mellitus with hyperglycemia: Secondary | ICD-10-CM | POA: Diagnosis not present

## 2019-02-22 DIAGNOSIS — R0689 Other abnormalities of breathing: Secondary | ICD-10-CM | POA: Diagnosis not present

## 2019-02-22 DIAGNOSIS — N184 Chronic kidney disease, stage 4 (severe): Secondary | ICD-10-CM | POA: Diagnosis present

## 2019-02-22 DIAGNOSIS — I509 Heart failure, unspecified: Secondary | ICD-10-CM | POA: Diagnosis not present

## 2019-02-22 DIAGNOSIS — J449 Chronic obstructive pulmonary disease, unspecified: Secondary | ICD-10-CM

## 2019-02-22 DIAGNOSIS — H5461 Unqualified visual loss, right eye, normal vision left eye: Secondary | ICD-10-CM | POA: Diagnosis present

## 2019-02-22 DIAGNOSIS — T380X5A Adverse effect of glucocorticoids and synthetic analogues, initial encounter: Secondary | ICD-10-CM | POA: Diagnosis present

## 2019-02-22 LAB — CBC WITH DIFFERENTIAL/PLATELET
Abs Immature Granulocytes: 0.22 10*3/uL — ABNORMAL HIGH (ref 0.00–0.07)
Abs Immature Granulocytes: 0.13 10*3/uL — ABNORMAL HIGH (ref 0.00–0.07)
Basophils Absolute: 0.1 10*3/uL (ref 0.0–0.1)
Basophils Absolute: 0.1 10*3/uL (ref 0.0–0.1)
Basophils Relative: 1 %
Basophils Relative: 0 %
Eosinophils Absolute: 0.5 10*3/uL (ref 0.0–0.5)
Eosinophils Absolute: 0.1 10*3/uL (ref 0.0–0.5)
Eosinophils Relative: 4 %
Eosinophils Relative: 0 %
HCT: 45.9 % (ref 39.0–52.0)
HCT: 45 % (ref 39.0–52.0)
Hemoglobin: 12.5 g/dL — ABNORMAL LOW (ref 13.0–17.0)
Hemoglobin: 12.9 g/dL — ABNORMAL LOW (ref 13.0–17.0)
Immature Granulocytes: 2 %
Immature Granulocytes: 1 %
Lymphocytes Relative: 10 %
Lymphocytes Relative: 5 %
Lymphs Abs: 1.2 10*3/uL (ref 0.7–4.0)
Lymphs Abs: 0.6 10*3/uL — ABNORMAL LOW (ref 0.7–4.0)
MCH: 29.4 pg (ref 26.0–34.0)
MCH: 31 pg (ref 26.0–34.0)
MCHC: 27.2 g/dL — ABNORMAL LOW (ref 30.0–36.0)
MCHC: 28.7 g/dL — ABNORMAL LOW (ref 30.0–36.0)
MCV: 108 fL — ABNORMAL HIGH (ref 80.0–100.0)
MCV: 108.2 fL — ABNORMAL HIGH (ref 80.0–100.0)
Monocytes Absolute: 1 10*3/uL (ref 0.1–1.0)
Monocytes Absolute: 0.3 10*3/uL (ref 0.1–1.0)
Monocytes Relative: 9 %
Monocytes Relative: 3 %
Neutro Abs: 8.7 10*3/uL — ABNORMAL HIGH (ref 1.7–7.7)
Neutro Abs: 10.7 10*3/uL — ABNORMAL HIGH (ref 1.7–7.7)
Neutrophils Relative %: 74 %
Neutrophils Relative %: 91 %
Platelets: 223 10*3/uL (ref 150–400)
Platelets: 194 10*3/uL (ref 150–400)
RBC: 4.25 MIL/uL (ref 4.22–5.81)
RBC: 4.16 MIL/uL — ABNORMAL LOW (ref 4.22–5.81)
RDW: 12.7 % (ref 11.5–15.5)
RDW: 12.8 % (ref 11.5–15.5)
WBC: 11.6 10*3/uL — ABNORMAL HIGH (ref 4.0–10.5)
WBC: 11.9 10*3/uL — ABNORMAL HIGH (ref 4.0–10.5)
nRBC: 0 % (ref 0.0–0.2)
nRBC: 0 % (ref 0.0–0.2)

## 2019-02-22 LAB — RAPID URINE DRUG SCREEN, HOSP PERFORMED
Amphetamines: NOT DETECTED
Barbiturates: NOT DETECTED
Benzodiazepines: NOT DETECTED
Cocaine: NOT DETECTED
Opiates: POSITIVE — AB
Tetrahydrocannabinol: NOT DETECTED

## 2019-02-22 LAB — BLOOD GAS, ARTERIAL
Acid-Base Excess: 6.1 mmol/L — ABNORMAL HIGH (ref 0.0–2.0)
Acid-Base Excess: 7.1 mmol/L — ABNORMAL HIGH (ref 0.0–2.0)
Acid-Base Excess: 5.8 mmol/L — ABNORMAL HIGH (ref 0.0–2.0)
Bicarbonate: 38 mmol/L — ABNORMAL HIGH (ref 20.0–28.0)
Bicarbonate: 37.6 mmol/L — ABNORMAL HIGH (ref 20.0–28.0)
Bicarbonate: 35.7 mmol/L — ABNORMAL HIGH (ref 20.0–28.0)
Delivery systems: POSITIVE
Drawn by: 308601
Drawn by: 308601
Drawn by: 441261
Expiratory PAP: 8
FIO2: 40
Inspiratory PAP: 18
O2 Content: 6 L/min
O2 Content: 6 L/min
O2 Saturation: 96.1 %
O2 Saturation: 90.9 %
O2 Saturation: 92.8 %
Patient temperature: 98.6
Patient temperature: 98.6
Patient temperature: 98.6
pCO2 arterial: 105 mmHg (ref 32.0–48.0)
pCO2 arterial: 91.2 mmHg (ref 32.0–48.0)
pCO2 arterial: 85.5 mmHg (ref 32.0–48.0)
pH, Arterial: 7.184 — CL (ref 7.350–7.450)
pH, Arterial: 7.239 — ABNORMAL LOW (ref 7.350–7.450)
pH, Arterial: 7.244 — ABNORMAL LOW (ref 7.350–7.450)
pO2, Arterial: 90.2 mmHg (ref 83.0–108.0)
pO2, Arterial: 61.6 mmHg — ABNORMAL LOW (ref 83.0–108.0)
pO2, Arterial: 68.7 mmHg — ABNORMAL LOW (ref 83.0–108.0)

## 2019-02-22 LAB — COMPREHENSIVE METABOLIC PANEL
ALT: 25 U/L (ref 0–44)
AST: 21 U/L (ref 15–41)
Albumin: 3.4 g/dL — ABNORMAL LOW (ref 3.5–5.0)
Alkaline Phosphatase: 231 U/L — ABNORMAL HIGH (ref 38–126)
Anion gap: 9 (ref 5–15)
BUN: 30 mg/dL — ABNORMAL HIGH (ref 6–20)
CO2: 34 mmol/L — ABNORMAL HIGH (ref 22–32)
Calcium: 8.5 mg/dL — ABNORMAL LOW (ref 8.9–10.3)
Chloride: 99 mmol/L (ref 98–111)
Creatinine, Ser: 2.41 mg/dL — ABNORMAL HIGH (ref 0.61–1.24)
GFR calc Af Amer: 33 mL/min — ABNORMAL LOW (ref >60)
GFR calc non Af Amer: 29 mL/min — ABNORMAL LOW (ref >60)
Glucose, Bld: 243 mg/dL — ABNORMAL HIGH (ref 70–99)
Potassium: 4.4 mmol/L (ref 3.5–5.1)
Sodium: 142 mmol/L (ref 135–145)
Total Bilirubin: 0.2 mg/dL — ABNORMAL LOW (ref 0.3–1.2)
Total Protein: 8.1 g/dL (ref 6.5–8.1)

## 2019-02-22 LAB — URINALYSIS, ROUTINE W REFLEX MICROSCOPIC
Bilirubin Urine: NEGATIVE
Glucose, UA: 150 mg/dL — AB
Hgb urine dipstick: NEGATIVE
Ketones, ur: NEGATIVE mg/dL
Nitrite: NEGATIVE
Protein, ur: NEGATIVE mg/dL
Specific Gravity, Urine: 1.011 (ref 1.005–1.030)
pH: 7 (ref 5.0–8.0)

## 2019-02-22 LAB — GLUCOSE, CAPILLARY
Glucose-Capillary: 291 mg/dL — ABNORMAL HIGH (ref 70–99)
Glucose-Capillary: 322 mg/dL — ABNORMAL HIGH (ref 70–99)
Glucose-Capillary: 278 mg/dL — ABNORMAL HIGH (ref 70–99)
Glucose-Capillary: 270 mg/dL — ABNORMAL HIGH (ref 70–99)

## 2019-02-22 LAB — TSH: TSH: 0.372 u[IU]/mL (ref 0.350–4.500)

## 2019-02-22 LAB — TROPONIN I (HIGH SENSITIVITY)
Troponin I (High Sensitivity): 14 ng/L (ref <18)
Troponin I (High Sensitivity): 15 ng/L (ref <18)

## 2019-02-22 LAB — ECHOCARDIOGRAM LIMITED
Height: 70 in
Weight: 5971.82 oz

## 2019-02-22 LAB — MRSA PCR SCREENING: MRSA by PCR: NEGATIVE

## 2019-02-22 LAB — SARS CORONAVIRUS 2 BY RT PCR (HOSPITAL ORDER, PERFORMED IN ~~LOC~~ HOSPITAL LAB): SARS Coronavirus 2: NEGATIVE

## 2019-02-22 LAB — BRAIN NATRIURETIC PEPTIDE: B Natriuretic Peptide: 230.6 pg/mL — ABNORMAL HIGH (ref 0.0–100.0)

## 2019-02-22 MED ORDER — SODIUM CHLORIDE 0.9% FLUSH
3.0000 mL | Freq: Two times a day (BID) | INTRAVENOUS | Status: DC
  Administered 2019-02-22 – 2019-02-27 (×11): 3 mL via INTRAVENOUS

## 2019-02-22 MED ORDER — FUROSEMIDE 10 MG/ML IJ SOLN
60.0000 mg | Freq: Two times a day (BID) | INTRAMUSCULAR | Status: DC
  Administered 2019-02-22: 60 mg via INTRAVENOUS
  Filled 2019-02-22: qty 6

## 2019-02-22 MED ORDER — PERFLUTREN LIPID MICROSPHERE
1.0000 mL | INTRAVENOUS | Status: AC | PRN
  Administered 2019-02-22: 1 mL via INTRAVENOUS
  Filled 2019-02-22: qty 10

## 2019-02-22 MED ORDER — SODIUM CHLORIDE 0.9 % IV SOLN: 250.0000 mL | INTRAVENOUS | Status: DC | PRN

## 2019-02-22 MED ORDER — CHLORHEXIDINE GLUCONATE CLOTH 2 % EX PADS
6.0000 | MEDICATED_PAD | Freq: Every day | CUTANEOUS | Status: DC
  Administered 2019-02-22 – 2019-02-24 (×3): 6 via TOPICAL

## 2019-02-22 MED ORDER — INSULIN ASPART 100 UNIT/ML ~~LOC~~ SOLN
0.0000 [IU] | Freq: Every day | SUBCUTANEOUS | Status: DC
  Administered 2019-02-23 (×2): 3 [IU] via SUBCUTANEOUS
  Administered 2019-02-25: 2 [IU] via SUBCUTANEOUS

## 2019-02-22 MED ORDER — ARFORMOTEROL TARTRATE 15 MCG/2ML IN NEBU
15.0000 ug | INHALATION_SOLUTION | Freq: Two times a day (BID) | RESPIRATORY_TRACT | Status: DC
  Administered 2019-02-22 – 2019-02-27 (×11): 15 ug via RESPIRATORY_TRACT
  Filled 2019-02-22 (×10): qty 2

## 2019-02-22 MED ORDER — POLYETHYLENE GLYCOL 3350 17 G PO PACK
17.0000 g | PACK | Freq: Every day | ORAL | Status: DC | PRN
  Filled 2019-02-22: qty 1

## 2019-02-22 MED ORDER — ACETAMINOPHEN 325 MG PO TABS: 650.0000 mg | ORAL_TABLET | Freq: Four times a day (QID) | ORAL | Status: DC | PRN

## 2019-02-22 MED ORDER — METHYLPREDNISOLONE SODIUM SUCC 125 MG IJ SOLR
60.0000 mg | Freq: Four times a day (QID) | INTRAMUSCULAR | Status: DC
  Administered 2019-02-22 – 2019-02-23 (×4): 60 mg via INTRAVENOUS
  Filled 2019-02-22 (×4): qty 2

## 2019-02-22 MED ORDER — ARTIFICIAL TEARS OPHTHALMIC OINT
1.0000 "application " | TOPICAL_OINTMENT | Freq: Every evening | OPHTHALMIC | Status: DC | PRN
  Administered 2019-02-25: 1 via OPHTHALMIC
  Filled 2019-02-22 (×2): qty 3.5

## 2019-02-22 MED ORDER — FUROSEMIDE 10 MG/ML IJ SOLN
60.0000 mg | Freq: Four times a day (QID) | INTRAMUSCULAR | Status: DC
  Administered 2019-02-22 – 2019-02-23 (×4): 60 mg via INTRAVENOUS
  Filled 2019-02-22 (×4): qty 6

## 2019-02-22 MED ORDER — LEVALBUTEROL HCL 0.63 MG/3ML IN NEBU
0.6300 mg | INHALATION_SOLUTION | Freq: Two times a day (BID) | RESPIRATORY_TRACT | Status: DC
  Administered 2019-02-23 – 2019-02-27 (×9): 0.63 mg via RESPIRATORY_TRACT
  Filled 2019-02-22 (×9): qty 3

## 2019-02-22 MED ORDER — HYPROMELLOSE (GONIOSCOPIC) 2.5 % OP SOLN: 1.0000 [drp] | Freq: Three times a day (TID) | OPHTHALMIC | Status: DC | PRN

## 2019-02-22 MED ORDER — CARVEDILOL 12.5 MG PO TABS
12.5000 mg | ORAL_TABLET | Freq: Two times a day (BID) | ORAL | Status: DC
  Administered 2019-02-22 – 2019-02-27 (×11): 12.5 mg via ORAL
  Filled 2019-02-22 (×11): qty 1

## 2019-02-22 MED ORDER — POLYVINYL ALCOHOL 1.4 % OP SOLN
1.0000 [drp] | Freq: Three times a day (TID) | OPHTHALMIC | Status: DC | PRN
  Administered 2019-02-22: 1 [drp] via OPHTHALMIC
  Filled 2019-02-22: qty 15

## 2019-02-22 MED ORDER — INSULIN ASPART 100 UNIT/ML ~~LOC~~ SOLN
0.0000 [IU] | Freq: Three times a day (TID) | SUBCUTANEOUS | Status: DC
  Administered 2019-02-22: 11 [IU] via SUBCUTANEOUS
  Administered 2019-02-22: 8 [IU] via SUBCUTANEOUS
  Administered 2019-02-23 (×2): 11 [IU] via SUBCUTANEOUS
  Administered 2019-02-23: 8 [IU] via SUBCUTANEOUS
  Administered 2019-02-24: 5 [IU] via SUBCUTANEOUS
  Administered 2019-02-24 (×2): 8 [IU] via SUBCUTANEOUS
  Administered 2019-02-25 (×2): 5 [IU] via SUBCUTANEOUS
  Administered 2019-02-25: 3 [IU] via SUBCUTANEOUS
  Administered 2019-02-26 – 2019-02-27 (×4): 5 [IU] via SUBCUTANEOUS
  Administered 2019-02-27: 3 [IU] via SUBCUTANEOUS

## 2019-02-22 MED ORDER — CHLORHEXIDINE GLUCONATE CLOTH 2 % EX PADS
6.0000 | MEDICATED_PAD | Freq: Every day | CUTANEOUS | Status: DC
  Administered 2019-02-22: 6 via TOPICAL

## 2019-02-22 MED ORDER — IPRATROPIUM BROMIDE 0.02 % IN SOLN: 0.5000 mg | Freq: Four times a day (QID) | RESPIRATORY_TRACT | Status: DC | PRN

## 2019-02-22 MED ORDER — IPRATROPIUM BROMIDE 0.02 % IN SOLN
0.5000 mg | Freq: Four times a day (QID) | RESPIRATORY_TRACT | Status: DC
  Administered 2019-02-22 (×3): 0.5 mg via RESPIRATORY_TRACT
  Filled 2019-02-22 (×2): qty 2.5

## 2019-02-22 MED ORDER — TOPIRAMATE 100 MG PO TABS
100.0000 mg | ORAL_TABLET | Freq: Every day | ORAL | Status: DC
  Administered 2019-02-22 – 2019-02-26 (×5): 100 mg via ORAL
  Filled 2019-02-22 (×5): qty 1

## 2019-02-22 MED ORDER — INSULIN GLARGINE 100 UNIT/ML ~~LOC~~ SOLN
36.0000 [IU] | Freq: Every day | SUBCUTANEOUS | Status: DC
  Administered 2019-02-23: 36 [IU] via SUBCUTANEOUS
  Filled 2019-02-22: qty 0.36

## 2019-02-22 MED ORDER — CHLORHEXIDINE GLUCONATE 0.12 % MT SOLN
15.0000 mL | Freq: Two times a day (BID) | OROMUCOSAL | Status: DC
  Administered 2019-02-22 – 2019-02-25 (×7): 15 mL via OROMUCOSAL
  Filled 2019-02-22 (×8): qty 15

## 2019-02-22 MED ORDER — METHYLPREDNISOLONE SODIUM SUCC 125 MG IJ SOLR
125.0000 mg | Freq: Once | INTRAMUSCULAR | Status: AC
  Administered 2019-02-22: 125 mg via INTRAVENOUS
  Filled 2019-02-22: qty 2

## 2019-02-22 MED ORDER — FUROSEMIDE 10 MG/ML IJ SOLN
40.0000 mg | Freq: Once | INTRAMUSCULAR | Status: AC
  Administered 2019-02-22: 40 mg via INTRAVENOUS
  Filled 2019-02-22: qty 4

## 2019-02-22 MED ORDER — ONDANSETRON HCL 4 MG PO TABS: 4.0000 mg | ORAL_TABLET | Freq: Four times a day (QID) | ORAL | Status: DC | PRN

## 2019-02-22 MED ORDER — GUAIFENESIN ER 600 MG PO TB12
1200.0000 mg | ORAL_TABLET | Freq: Two times a day (BID) | ORAL | Status: DC
  Administered 2019-02-22 – 2019-02-27 (×11): 1200 mg via ORAL
  Filled 2019-02-22 (×11): qty 2

## 2019-02-22 MED ORDER — ACETAMINOPHEN 650 MG RE SUPP: 650.0000 mg | Freq: Four times a day (QID) | RECTAL | Status: DC | PRN

## 2019-02-22 MED ORDER — SODIUM CHLORIDE 0.9% FLUSH: 3.0000 mL | INTRAVENOUS | Status: DC | PRN

## 2019-02-22 MED ORDER — APIXABAN 5 MG PO TABS
5.0000 mg | ORAL_TABLET | Freq: Two times a day (BID) | ORAL | Status: DC
  Administered 2019-02-22 – 2019-02-27 (×11): 5 mg via ORAL
  Filled 2019-02-22 (×2): qty 1
  Filled 2019-02-22: qty 2
  Filled 2019-02-22 (×8): qty 1

## 2019-02-22 MED ORDER — ONDANSETRON HCL 4 MG/2ML IJ SOLN: 4.0000 mg | Freq: Four times a day (QID) | INTRAMUSCULAR | Status: DC | PRN

## 2019-02-22 MED ORDER — ORAL CARE MOUTH RINSE
15.0000 mL | Freq: Two times a day (BID) | OROMUCOSAL | Status: DC
  Administered 2019-02-22 – 2019-02-25 (×5): 15 mL via OROMUCOSAL

## 2019-02-22 MED ORDER — BUDESONIDE 0.25 MG/2ML IN SUSP
0.2500 mg | Freq: Two times a day (BID) | RESPIRATORY_TRACT | Status: DC
  Administered 2019-02-22 – 2019-02-24 (×5): 0.25 mg via RESPIRATORY_TRACT
  Filled 2019-02-22 (×6): qty 2

## 2019-02-22 MED ORDER — ALBUTEROL SULFATE HFA 108 (90 BASE) MCG/ACT IN AERS
6.0000 | INHALATION_SPRAY | Freq: Once | RESPIRATORY_TRACT | Status: AC
  Administered 2019-02-22: 6 via RESPIRATORY_TRACT
  Filled 2019-02-22: qty 6.7

## 2019-02-22 MED ORDER — DOXYCYCLINE HYCLATE 100 MG PO TABS
100.0000 mg | ORAL_TABLET | Freq: Two times a day (BID) | ORAL | Status: DC
  Administered 2019-02-22 – 2019-02-24 (×5): 100 mg via ORAL
  Filled 2019-02-22 (×5): qty 1

## 2019-02-22 MED ORDER — LEVALBUTEROL HCL 0.63 MG/3ML IN NEBU
0.6300 mg | INHALATION_SOLUTION | Freq: Four times a day (QID) | RESPIRATORY_TRACT | Status: DC
  Administered 2019-02-22 (×3): 0.63 mg via RESPIRATORY_TRACT
  Filled 2019-02-22 (×2): qty 3

## 2019-02-22 MED ORDER — ISOSORBIDE MONONITRATE ER 30 MG PO TB24
30.0000 mg | ORAL_TABLET | Freq: Every day | ORAL | Status: DC
  Administered 2019-02-22 – 2019-02-27 (×6): 30 mg via ORAL
  Filled 2019-02-22 (×6): qty 1

## 2019-02-22 MED ORDER — AMIODARONE HCL 200 MG PO TABS
100.0000 mg | ORAL_TABLET | Freq: Every day | ORAL | Status: DC
  Administered 2019-02-22 – 2019-02-27 (×6): 100 mg via ORAL
  Filled 2019-02-22 (×6): qty 1

## 2019-02-22 NOTE — ED Provider Notes (Signed)
[facility] DEPT Provider Note   CSN: [id number] Arrival date & time: 02/22/19  0253     History   Chief Complaint Chief Complaint  Patient presents with  . COPD  . Shortness of Breath    HPI Frank Cessna. is a 58 y.o. male.     Patient is a 58 year old male with past medical history of morbid obesity, chronic renal insufficiency, coronary artery disease, CHF, COPD, and diabetes.  He is brought by EMS for evaluation of shortness of breath and increased somnolence.  According to EMS, the wife called 911 for the above symptoms.  Patient normally uses 3 L of oxygen by nasal cannula at home, but required a nonrebreather by EMS.  Patient has little additional history secondary to somnolence and acuity of condition.  The history is provided by the EMS personnel.  COPD This is a new problem. The current episode started 6 to 12 hours ago. The problem occurs constantly. The problem has been rapidly worsening. Nothing aggravates the symptoms. Nothing relieves the symptoms. He has tried nothing for the symptoms.    Past Medical History:  Diagnosis Date  . Brain tumor ([place])    Takes Topamax so patient will not have headaches  . Bruises easily   . CAD (coronary artery disease)    Cath 09, 99% LAD  . Cardiomyopathy, ischemic    Ischemic  . CHF (congestive heart failure) ([place])   . Cyst near coccyx   . Cyst near tailbone   . Diabetes mellitus without complication (HCC)    borderline  . Edema of both legs    Takes Lasix  . Fibromyalgia   . Gout   . Headache(784.0)    with lights  . Hypertension    dr Percival Spanish  . Kidney stones   . Myocardial infarction ([place])   . Obesity   . Pneumonia    hx of  . Shortness of breath   . Stroke [facility])    Affected vision, walking, and facial drooping on right face    Patient Active Problem List   Diagnosis Date Noted  . CKD (chronic kidney disease) stage 3, GFR 30-59 ml/min (HCC) 09/14/2018  . DM  (diabetes mellitus) type II uncontrolled, periph vascular disorder ([place]) 09/14/2018  . Chronic diastolic heart failure ([place]) 05/20/2018  . Acute diastolic heart failure ([place])   . Atrial fibrillation with RVR ([place])   . Hypercarbia   . Pressure injury of skin 01/29/2017  . Hereditary and idiopathic peripheral neuropathy 01/08/2017  . Acute on chronic respiratory failure ([place]) 02/16/2016  . Irritation of right eye 10/18/2015  . 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 Headache due to intracranial disease 10/13/2013  . Edema 12/17/2012  . 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 Acoustic neuroma ([place]) 10/12/2012  . Status post craniotomy 10/12/2012  . CAD (coronary artery disease) of artery bypass graft 09/09/2012  . 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 Morbid obesity ([place]) 09/09/2012    Past Surgical History:  Procedure Laterality Date  . 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 ANAL FISTULECTOMY    . CARDIOVERSION N/A 05/08/2018   Procedure: CARDIOVERSION;  Surgeon: [name], MD;  [facility] END[facility]COPY;  Service: Cardiovascular;  Laterality: N/A;  . CORONARY ARTERY BYPASS GRAFT     LIMA to LAD 2009  . CRANIOTOMY  Right 11/20/2012   Procedure: CRANIOTOMY REPAIR DURAL/CENTRAL SPINAL FLUID LEAK;  Surgeon: [name], MD;  Location: [facility] NEURO ORS;  Service: Neurosurgery;  Laterality: Right;  . EYE SURGERY     to coorect lid and double vision  . MEDIAN RECTUS REPAIR Right 02/16/2016   Procedure: RIGHT LATERAL RECTUS RESECTION; SUPERIOR RECTUS RESECTION RIGHT EYE; RIGHT SUPERIOR RECTUS RECESSION; LATERAL TARSAL STRIP RIGHT LOWER EYELID;  Surgeon: [name], MD;  Location: [facility];  Service: Ophthalmology;  Laterality: Right;  . PLACEMENT OF LUMBAR DRAIN N/A 11/20/2012   Procedure: Attempted PLACEMENT OF LUMBAR DRAIN;  Surgeon: [name], MD;  Location: [facility] NEURO ORS;  Service: Neurosurgery;  Laterality: N/A;  . RETROSIGMOID CRANIECTOMY FOR TUMOR RESECTION Right 10/11/2012   Procedure: RETROSIGMOID CRANIECTOMY FOR TUMOR RESECTION;  Surgeon: [name], MD;  Location: [place] NEURO ORS;  Service: Neurosurgery;  Laterality: Right;  Craniotomy for acoustic neuroma  . TEE WITHOUT CARDIOVERSION N/A 05/08/2018   Procedure: TRANSESOPHAGEAL ECHOCARDIOGRAM (TEE);  Surgeon: [name], MD;  Location: [facility] ENDOSCOPY;  Service: Cardiovascular;  Laterality: N/A;  . TRACHEOSTOMY TUBE PLACEMENT N/A 10/11/2012   Procedure: TRACHEOSTOMY;  Surgeon: Ascencion Dike, MD;  Location: [facility] NEURO ORS;  Service: ENT;  Laterality: N/A;  . UMBILICAL HERNIA REPAIR          Home Medications    Prior to Admission medications   Medication Sig Start Date End Date Taking? Authorizing Provider  amiodarone (PACERONE) 100 MG tablet Take 1 tablet (100 mg total) by mouth daily. 02/06/19   Clegg, Amy D, NP  apixaban (ELIQUIS) 5 MG TABS tablet Take 1 tablet (5 mg total) by mouth 2 (two) times daily. 02/06/19   Clegg, Amy D, NP  artificial tears (LACRILUBE) OINT ophthalmic ointment Place 1 application into both eyes at bedtime as needed for dry eyes.     [provider]  Blood Glucose Monitoring Suppl (ONE TOUCH ULTRA MINI) w/Device KIT Use as directed twice a day 12/25/18   Caren Macadam, MD  carvedilol (COREG) 12.5 MG tablet Take 1 tablet (12.5 mg total) by mouth 2 (two) times daily with a meal. 02/06/19   Clegg, Amy D, NP  Hydroactive Dressings (DUODERM HYDROACTIVE) GEL duoderm or generic equivalent gel dressing. 4cmx4cm. Apply to wound q 3 days until healed. 09/13/18   Caren Macadam, MD  HYDROcodone-acetaminophen (NORCO) 10-325 MG tablet TAKE 1 TABLET BY MOUTH EVERY 6 HOURS AS NEEDED FOR PAIN MUST LAST 30 DAYS 02/07/19   Bayard Hugger, NP  Insulin Glargine (BASAGLAR KWIKPEN) 100 UNIT/ML SOPN Inject 0.45 mLs (45 Units total) into the skin at  bedtime. 10/16/18   Koberlein, Steele Berg, MD  Insulin Pen Needle 31G X 8 MM MISC 1 Units by Does not apply route 2 (two) times a day. 02/11/19   Caren Macadam, MD  isosorbide mononitrate (IMDUR) 30 MG 24 hr tablet Take 1 tablet (30 mg total) by mouth daily. 02/06/19   Clegg, Amy D, NP  Lancets (ONETOUCH ULTRASOFT) lancets Use as instructed 12/25/18   Koberlein, Andris Flurry C, MD  LANTUS 100 UNIT/ML injection INJECT 45 UNITS INTO THE SKIN AT BEDTIME FOR 120 DOSES 12/13/18   Koberlein, Andris Flurry C, MD  nystatin (MYCOSTATIN/NYSTOP) powder APPLY  1  GRAM TOPICALLY TWICE DAILY AS NEEDED 04/08/18   Meredith Staggers, MD  ONE TOUCH ULTRA TEST test strip Use as instructed twice a day 12/25/18   Caren Macadam, MD  potassium chloride SA (K-DUR) 20 MEQ  tablet Take 2 tablets (40 mEq total) by mouth 2 (two) times daily. 02/06/19   Clegg, Amy D, NP  sucralfate (CARAFATE) 1 g tablet Take 1 tablet (1 g total) by mouth 4 (four) times daily -  with meals and at bedtime. 02/23/16   Kelvin Cellar, MD  tiZANidine (ZANAFLEX) 4 MG tablet 1 tablet four times daily as needed for muscle spasms 10/09/18   Meredith Staggers, MD  topiramate (TOPAMAX) 100 MG tablet Take 1 tablet (100 mg total) by mouth at bedtime. 10/09/18   Meredith Staggers, MD  torsemide (DEMADEX) 20 MG tablet TAKE 3 TABLETS (60 MG TOTAL) BY MOUTH 2 TIMES DAILY. 02/06/19   [name], MD    Family History Family History  Problem Relation Age of Onset  . CAD Father   . Hypertension Mother   . Cancer Other        multiple relatives  . Diabetes Maternal Grandmother   . Diabetes Maternal Aunt   . Leukemia Cousin   . Leukemia Maternal Grandfather     Social History Social History   Tobacco Use  . Smoking status: Former Smoker    Packs/day: 0.25    Years: 20.00    Pack years: 5.00    Types: Cigarettes  . Smokeless tobacco: Current User  . Tobacco comment: Rare tobacco.  "[name]".  Substance Use Topics  . Alcohol use: No     Alcohol/week: 0.0 standard drinks  . Drug use: No     Allergies   Morphine and related, Novocain [procaine], Carbamazepine, Gabapentin, Ivp dye [iodinated diagnostic agents], and Other   Review of Systems Review of Systems  Unable to perform ROS: Acuity of condition     Physical Exam Updated Vital Signs BP (!) 160/64 (BP Location: Left Arm)   Pulse 78   Temp 97.9 F (36.6 C) (Oral)   Resp (!) 26   SpO2 100%   Physical Exam Vitals signs and nursing note reviewed.  Constitutional:      General: He is not in acute distress.    Appearance: He is well-developed. He is not diaphoretic.  HENT:     Head: Normocephalic and atraumatic.  Neck:     Musculoskeletal: Normal range of motion and neck supple.  Cardiovascular:     Rate and Rhythm: Normal rate and regular rhythm.     Heart sounds: No murmur. No friction rub.  Pulmonary:     Effort: Pulmonary effort is normal. No respiratory distress.     Breath sounds: Normal breath sounds. No wheezing or rales.  Abdominal:     General: Bowel sounds are normal. There is no distension.     Palpations: Abdomen is soft.     Tenderness: There is no abdominal tenderness.  Musculoskeletal: Normal range of motion.  Skin:    General: Skin is warm and dry.  Neurological:     Mental Status: He is alert.     Coordination: Coordination normal.     Comments: Patient is somnolent but arousable.  He attempts to respond to questions, then drifts back to sleep.      ED Treatments / Results  Labs (all labs ordered are listed, but only abnormal results are displayed) Labs Reviewed  SARS CORONAVIRUS 2 ([facility])  COMPREHENSIVE METABOLIC PANEL  CBC WITH DIFFERENTIAL/PLATELET  BRAIN NATRIURETIC PEPTIDE  TROPONIN I (HIGH SENSITIVITY)  TROPONIN I (HIGH SENSITIVITY)  BLOOD GAS, ARTERIAL    EKG None  Radiology No results found.  Procedures Procedures (including critical care time)   Medications Ordered in ED Medications - No data to display   Initial Impression / Assessment and Plan / ED Course  I have reviewed the triage vital signs and the nursing notes.  Pertinent labs & imaging results that were available during my care of the patient were reviewed by me and considered in my medical decision making (see chart for details).  Patient brought by EMS for evaluation of somnolence and difficulty breathing.  Patient with history of multiple medical problems including CHF and COPD.  Patient's initial blood gas shows a respiratory acidosis with CO2 retention that I suspect is the cause of his somnolence.  Patient was given Lasix, steroids, and albuterol by [facility].  He was observed and blood gas repeated.  His pH is improving, PCO2 is coming down, and he appears clinically much improved.  The patient is now more awake, alert, and conversive.  At this point, I feel as though patient is appropriate for admission.  I have discussed the case with Dr. Hal Hope who agrees to admit.  CRITICAL CARE Performed by: Veryl Speak Total critical care time: 35 minutes Critical care time was exclusive of separately billable procedures and treating other patients. Critical care was necessary to treat or prevent imminent or life-threatening deterioration. Critical care was time spent personally by me on the following activities: development of treatment plan with patient and/or surrogate as well as nursing, discussions with consultants, evaluation of patient's response to treatment, examination of patient, obtaining history from patient or surrogate, ordering and performing treatments and interventions, ordering and review of laboratory studies, ordering and review of radiographic studies, pulse oximetry and re-evaluation of patient's condition.   Final Clinical Impressions(s) / ED Diagnoses   Final diagnoses:  None    ED Discharge Orders    None       Veryl Speak, MD 02/22/19 [phone]

## 2019-02-22 NOTE — H&P (Signed)
History and Physical    [name]. WCB:[id number] DOB: [birth year] DOA: 02/22/2019  PCP: Caren Macadam, MD   Patient coming from: Home  Chief Complaint: SOB and Somnolence   HPI: [name]. is a 58 y.o. male with medical history significant for but not limited to morbid obesity, history of brain tumor status post craniotomy, CAD post CABG with LIMA to LAD, Fibromyalgia, gout, history of atrial fibrillation with RVR status post prior cardioversion, history of chronic diastolic CHF, COPD on 3 L of oxygen at home via nasal cannula, CKD stage III, diabetes mellitus type 2, history of nephrolithiasis, history of CVA and other comorbidities who presents with a chief complaint of shortness of breath and somnolence.  Of note the patient is somnolent and is very confused at this time but is able to answer some orientation questions and knows his name and knows who the president is but does not know the year or the month.  History is limited from the patient due to his confusion and most of it was taken from the chart as well as EDP and nursing.  Patient was brought to the hospital via EMS after his wife called 911 for shortness of breath and somnolence.  In the morning around 1 AM he called out to his Wife that he started feeling Short of breath.  Because shortness of breath did not fo away wife called EMS. Prior to this he had taken a pain pill. Currently does not tell me that he is short of breath or Chest Pain now and he was acting confused and not following directions appropriately.  He was worked up in the ED and ABG done showed a respiratory acidosis which was likely the cause of the patient's somnolence.  TRH was called admit this patient for shortness of breath and somnolence and he was admitted to the stepdown unit for BiPAP, diuresis as well as steroids.  Cardiology was consulted for suspected CHF exacerbation.  ED Course: In the ED patient had basic blood work done as  well as a chest x-ray and a coronavirus testing which was negative.  Patient was placed on 15 L nonrebreather and then transferred to stepdown unit on 6.  He was given a dose of Lasix, steroids, and an MDI inhaler in the ED.  ABG done showed a respiratory acidosis.  Review of Systems: As per HPI otherwise all other systems reviewed and negative.   Past Medical History:  Diagnosis Date   Brain tumor [facility])    Takes Topamax so patient will not have headaches   Bruises easily    CAD (coronary artery disease)    Cath 09, 99% LAD   Cardiomyopathy, ischemic    Ischemic   CHF (congestive heart failure) (HCC)    Cyst near coccyx    Cyst near tailbone    Diabetes mellitus without complication (HCC)    borderline   Edema of both legs    Takes Lasix   Fibromyalgia    Gout    Headache(784.0)    with lights   Hypertension    dr hochrein   Kidney stones    Myocardial infarction ([place])    Obesity    Pneumonia    hx of   Shortness of breath    Stroke ([place])    Affected vision, walking, and facial drooping on right face   Past Surgical History:  Procedure Laterality Date   ACOUSTIC NEUROMA RESECTION N/A 10/11/2012   Procedure: ACOUSTIC NEUROMA  RESECTION;  Surgeon: Ascencion Dike, MD;  Location: [place] NEURO ORS;  Service: ENT;  Laterality: N/A;   ANAL FISTULECTOMY     CARDIOVERSION N/A 05/08/2018   Procedure: CARDIOVERSION;  Surgeon: [name], MD;  Location: [facility] ENDOSCOPY;  Service: Cardiovascular;  Laterality: N/A;   CORONARY ARTERY BYPASS GRAFT     LIMA to LAD 2009   CRANIOTOMY Right 11/20/2012   Procedure: CRANIOTOMY REPAIR DURAL/CENTRAL SPINAL FLUID LEAK;  Surgeon: [name], MD;  Location: [place] NEURO ORS;  Service: Neurosurgery;  Laterality: Right;   EYE SURGERY     to coorect lid and double vision   MEDIAN RECTUS REPAIR Right 02/16/2016   Procedure: RIGHT LATERAL RECTUS RESECTION; SUPERIOR RECTUS RESECTION RIGHT EYE; RIGHT SUPERIOR RECTUS RECESSION; LATERAL  TARSAL STRIP RIGHT LOWER EYELID;  Surgeon: [name], MD;  Location: [place];  Service: Ophthalmology;  Laterality: Right;   PLACEMENT OF LUMBAR DRAIN N/A 11/20/2012   Procedure: Attempted PLACEMENT OF LUMBAR DRAIN;  Surgeon: [name], MD;  Location: [place] NEURO ORS;  Service: Neurosurgery;  Laterality: N/A;   RETROSIGMOID CRANIECTOMY FOR TUMOR RESECTION Right 10/11/2012   Procedure: RETROSIGMOID CRANIECTOMY FOR TUMOR RESECTION;  Surgeon: [name], MD;  Location: [place] NEURO ORS;  Service: Neurosurgery;  Laterality: Right;  Craniotomy for acoustic neuroma   TEE WITHOUT CARDIOVERSION N/A 05/08/2018   Procedure: TRANSESOPHAGEAL ECHOCARDIOGRAM (TEE);  Surgeon: [name], MD;  Location[facility] ENDOSCOPY;  Service: Cardiovascular;  Laterality: N/A;   TRACHEOSTOMY TUBE PLACEMENT N/A 10/11/2012   Procedure: TRACHEOSTOMY;  Surgeon: Ascencion Dike, MD;  Location: [facility] NEURO ORS;  Service: ENT;  Laterality: N/A;   UMBILICAL HERNIA REPAIR     SOCIAL HISTORY  reports that he has quit smoking. His smoking use included cigarettes. He has a 5.00 pack-year smoking history. He uses smokeless tobacco. He reports that he does not drink alcohol or use drugs.  Allergies  Allergen Reactions   Morphine And Related Anaphylaxis    "makes me stop breathing"   Novocain [Procaine] Anaphylaxis   Carbamazepine     shaking   Gabapentin Other (See Comments)    Blood in urine   Ivp Dye [Iodinated Diagnostic Agents]     Passing out   Other     Steroids makes hearts race   Family History  Problem Relation Age of Onset   CAD Father    Hypertension Mother    Cancer Other        multiple relatives   Diabetes Maternal Grandmother    Diabetes Maternal Aunt    Leukemia Cousin    Leukemia Maternal Grandfather    Prior to Admission medications   Medication Sig Start Date End Date Taking? Authorizing Provider  amiodarone (PACERONE) 100 MG tablet Take 1 tablet (100 mg total) by mouth daily. 02/06/19   Yes Clegg, Amy D, NP  apixaban (ELIQUIS) 5 MG TABS tablet Take 1 tablet (5 mg total) by mouth 2 (two) times daily. Patient taking differently: Take 5 mg by mouth daily.  02/06/19  Yes Clegg, Amy D, NP  artificial tears (LACRILUBE) OINT ophthalmic ointment Place 1 application into both eyes at bedtime as needed for dry eyes.    Yes [provider]  carvedilol (COREG) 12.5 MG tablet Take 1 tablet (12.5 mg total) by mouth 2 (two) times daily with a meal. 02/06/19  Yes Clegg, Amy D, NP  HYDROcodone-acetaminophen (NORCO) 10-325 MG tablet TAKE 1 TABLET BY MOUTH EVERY 6 HOURS AS NEEDED FOR PAIN MUST  LAST 30 DAYS Patient taking differently: Take 1 tablet by mouth every 6 (six) hours as needed for moderate pain.  02/07/19  Yes Bayard Hugger, NP  hydroxypropyl methylcellulose / hypromellose (ISOPTO TEARS / GONIOVISC) 2.5 % ophthalmic solution Place 1 drop into both eyes 3 (three) times daily as needed for dry eyes.   Yes [provider]  LANTUS 100 UNIT/ML injection INJECT 45 UNITS INTO THE SKIN AT BEDTIME FOR 120 DOSES Patient taking differently: Inject 45 Units into the skin at bedtime.  12/13/18  Yes Koberlein, Junell C, MD  nystatin (MYCOSTATIN/NYSTOP) powder APPLY  1  GRAM TOPICALLY TWICE DAILY AS NEEDED Patient taking differently: Apply 1 g topically 2 (two) times daily as needed (rash).  04/08/18  Yes Meredith Staggers, MD  potassium chloride SA (K-DUR) 20 MEQ tablet Take 2 tablets (40 mEq total) by mouth 2 (two) times daily. 02/06/19  Yes Clegg, Amy D, NP  tiZANidine (ZANAFLEX) 4 MG tablet 1 tablet four times daily as needed for muscle spasms Patient taking differently: Take 4 mg by mouth 4 (four) times daily as needed for muscle spasms.  10/09/18  Yes Meredith Staggers, MD  topiramate (TOPAMAX) 100 MG tablet Take 1 tablet (100 mg total) by mouth at bedtime. 10/09/18  Yes Meredith Staggers, MD  torsemide (DEMADEX) 20 MG tablet TAKE 3 TABLETS (60 MG TOTAL) BY MOUTH 2 TIMES DAILY. Patient  taking differently: Take 60 mg by mouth 2 (two) times daily.  02/06/19  Yes [name], MD  Blood Glucose Monitoring Suppl (ONE TOUCH ULTRA MINI) w/Device KIT Use as directed twice a day 12/25/18   Koberlein, Steele Berg, MD  Hydroactive Dressings (DUODERM HYDROACTIVE) GEL duoderm or generic equivalent gel dressing. 4cmx4cm. Apply to wound q 3 days until healed. 09/13/18   Caren Macadam, MD  Insulin Glargine (BASAGLAR KWIKPEN) 100 UNIT/ML SOPN Inject 0.45 mLs (45 Units total) into the skin at bedtime. Patient not taking: Reported on 02/22/2019 10/16/18   Caren Macadam, MD  Insulin Pen Needle 31G X 8 MM MISC 1 Units by Does not apply route 2 (two) times a day. 02/11/19   Caren Macadam, MD  isosorbide mononitrate (IMDUR) 30 MG 24 hr tablet Take 1 tablet (30 mg total) by mouth daily. Patient not taking: Reported on 02/22/2019 02/06/19   Darrick Grinder D, NP  Lancets (ONETOUCH ULTRASOFT) lancets Use as instructed 12/25/18   Caren Macadam, MD  ONE TOUCH ULTRA TEST test strip Use as instructed twice a day 12/25/18   Koberlein, Steele Berg, MD  sucralfate (CARAFATE) 1 g tablet Take 1 tablet (1 g total) by mouth 4 (four) times daily -  with meals and at bedtime. Patient not taking: Reported on 02/22/2019 02/23/16   Kelvin Cellar, MD   Physical Exam: Vitals:   02/22/19 0543 02/22/19 0545 02/22/19 0630 02/22/19 0700  BP: 137/63 137/63 (!) 131/54 (!) 155/75  Pulse: 70 76 69 75  Resp: 14 20 (!) 24 (!) 22  Temp: 97.9 F (36.6 C)     TempSrc: Oral     SpO2: 95% 94% 94% 93%   Constitutional: WN/WD morbidly obese Caucasian male in mild respiratory distress who appears very confused and somnolent Eyes: Lids and conjunctivae normal, sclerae anicteric  ENMT: External Ears, Nose appear normal. Grossly normal hearing.  Has nose bleeding from nasal cannula Neck: Appears normal, supple, no cervical masses, normal ROM, no appreciable thyromegaly; difficult to assess JVD due to body habitus Respiratory:  Diminished  to auscultation bilaterally with coarse breath sounds and upper airway wheezing and some mild crackle.; No appreciable rales, rhonchi .  On 6 L of high flow nasal cannula and is tachypneic Cardiovascular: RRR, no murmurs / rubs / gallops. S1 and S2 auscultated. 1+ LE extremity edema.  Abdomen: Soft, non-tender,Distended due to body habitus. No masses palpated. No appreciable hepatosplenomegaly. Bowel sounds positive x4.  GU: Deferred. Musculoskeletal: No clubbing / cyanosis of digits/nails. No joint deformity upper and lower extremities. Skin: No rashes, lesions, ulcers on a limited skin evaluation. No induration; Warm and dry.  Neurologic: CN 2-12 grossly intact with no focal deficits. Romberg sign and cerebellar reflexes not assessed.  Psychiatric: Impaired judgment and insight. Confused but is oriented x2. Anxious mood and appropriate affect.   Labs on Admission: I have personally reviewed following labs and imaging studies  CBC: Recent Labs  Lab 02/22/19 0324  WBC 11.6*  NEUTROABS 8.7*  HGB 12.5*  HCT 45.9  MCV 108.0*  PLT 147   Basic Metabolic Panel: Recent Labs  Lab 02/22/19 0324  NA 142  K 4.4  CL 99  CO2 34*  GLUCOSE 243*  BUN 30*  CREATININE 2.41*  CALCIUM 8.5*   GFR: CrCl cannot be calculated (Unknown ideal weight.). Liver Function Tests: Recent Labs  Lab 02/22/19 0324  AST 21  ALT 25  ALKPHOS 231*  BILITOT 0.2*  PROT 8.1  ALBUMIN 3.4*   No results for input(s): LIPASE, AMYLASE in the last 168 hours. No results for input(s): AMMONIA in the last 168 hours. Coagulation Profile: No results for input(s): INR, PROTIME in the last 168 hours. Cardiac Enzymes: No results for input(s): CKTOTAL, CKMB, CKMBINDEX, TROPONINI in the last 168 hours. BNP (last 3 results) No results for input(s): PROBNP in the last 8760 hours. HbA1C: No results for input(s): HGBA1C in the last 72 hours. CBG: No results for input(s): GLUCAP in the last 168 hours. Lipid  Profile: No results for input(s): CHOL, HDL, LDLCALC, TRIG, CHOLHDL, LDLDIRECT in the last 72 hours. Thyroid Function Tests: No results for input(s): TSH, T4TOTAL, FREET4, T3FREE, THYROIDAB in the last 72 hours. Anemia Panel: No results for input(s): VITAMINB12, FOLATE, FERRITIN, TIBC, IRON, RETICCTPCT in the last 72 hours. Urine analysis:    Component Value Date/Time   COLORURINE YELLOW 04/30/2018 2119   APPEARANCEUR HAZY (A) 04/30/2018 2119   LABSPEC 1.015 04/30/2018 2119   PHURINE 5.0 04/30/2018 2119   GLUCOSEU >=500 (A) 04/30/2018 2119   HGBUR MODERATE (A) 04/30/2018 2119   BILIRUBINUR NEGATIVE 04/30/2018 2119   Sneads NEGATIVE 04/30/2018 2119   PROTEINUR 30 (A) 04/30/2018 2119   UROBILINOGEN 0.2 11/19/2012 2217   NITRITE NEGATIVE 04/30/2018 2119   LEUKOCYTESUR MODERATE (A) 04/30/2018 2119   Sepsis Labs: !!!!!!!!!!!!!!!!!!!!!!!!!!!!!!!!!!!!!!!!!!!! _0 (procalcitonin:4,lacticidven:4) ) Recent Results (from the past 240 hour(s))  SARS Coronavirus 2 (CEPHEID- Performed in [place] hospital lab), Hosp Order     Status: None   Collection Time: 02/22/19  3:24 AM   Specimen: Nasopharyngeal Swab  Result Value Ref Range Status   SARS Coronavirus 2 NEGATIVE NEGATIVE Final    Comment: (NOTE) If result is NEGATIVE SARS-CoV-2 target nucleic acids are NOT DETECTED. The SARS-CoV-2 RNA is generally detectable in upper and lower  respiratory specimens during the acute phase of infection. The lowest  concentration of SARS-CoV-2 viral copies this assay can detect is 250  copies / mL. A negative result does not preclude SARS-CoV-2 infection  and should not be used as the sole basis for treatment  or other  patient management decisions.  A negative result may occur with  improper specimen collection / handling, submission of specimen other  than nasopharyngeal swab, presence of viral mutation(s) within the  areas targeted by this assay, and inadequate number of viral copies    (<250 copies / mL). A negative result must be combined with clinical  observations, patient history, and epidemiological information. If result is POSITIVE SARS-CoV-2 target nucleic acids are DETECTED. The SARS-CoV-2 RNA is generally detectable in upper and lower  respiratory specimens dur ing the acute phase of infection.  Positive  results are indicative of active infection with SARS-CoV-2.  Clinical  correlation with patient history and other diagnostic information is  necessary to determine patient infection status.  Positive results do  not rule out bacterial infection or co-infection with other viruses. If result is PRESUMPTIVE POSTIVE SARS-CoV-2 nucleic acids MAY BE PRESENT.   A presumptive positive result was obtained on the submitted specimen  and confirmed on repeat testing.  While 2019 novel coronavirus  (SARS-CoV-2) nucleic acids may be present in the submitted sample  additional confirmatory testing may be necessary for epidemiological  and / or clinical management purposes  to differentiate between  SARS-CoV-2 and other Sarbecovirus currently known to infect humans.  If clinically indicated additional testing with an alternate test  methodology [phone]) is advised. The SARS-CoV-2 RNA is generally  detectable in upper and lower respiratory sp ecimens during the acute  phase of infection. The expected result is Negative. Fact Sheet for Patients:  StrictlyIdeas.no Fact Sheet for Healthcare Providers: BankingDealers.co.za This test is not yet approved or cleared by the [place] FDA and has been authorized for detection and/or diagnosis of SARS-CoV-2 by FDA under an Emergency Use Authorization (EUA).  This EUA will remain in effect (meaning this test can be used) for the duration of the COVID-19 declaration under Section 564(b)(1) of the Act, 21 U.S.C. section 360bbb-3(b)(1), unless the authorization is terminated  or revoked sooner. Performed at [facility], [place] [address], [place], [place] [address]     Radiological Exams on Admission: Dg Chest Port 1 View  Result Date: 02/22/2019 CLINICAL DATA:  58 y/o M; history of COPD and CHF. Shortness of breath. EXAM: PORTABLE CHEST 1 VIEW COMPARISON:  04/30/2018 chest radiograph. FINDINGS: Cardiomegaly, CABG, and median sternotomy postsurgical changes. Diffuse patchy opacifications of the lungs. Possible small right effusion. No pneumothorax. No acute osseous abnormality is evident. IMPRESSION: Diffuse patchy opacifications of the lungs probably represents pulmonary edema. Possible small right effusion. Cardiomegaly. Electronically Signed   By: Kristine Garbe M.D.   On: 02/22/2019 04:09    EKG: Independently reviewed.  Showed a normal sinus rhythm with right axis deviation and a QTC of 465.  There is no evidence of ST elevation or depression on my interpretation  Assessment/Plan Active Problems:   Acute respiratory failure with hypoxia and hypercapnia (HCC)  Acute on Chronic Respiratory Failure with Hypoxia and Hypercapnea in the setting of acute COPD exacerbation as well as acute on chronic diastolic CHF exacerbation requiring NIPPV with BiPAP -Placed in stepdown inpatient given how sick the patient is -Was not on BiPAP slight initiated BiPAP -Started diuresis with IV Lasix 80 mg twice daily, steroids with IV Solu-Medrol every 6h, nebs with Xopenex and Atrovent every 6 hours along with budesonide and Brovana -Admission ABG showed a pH of 7.184, PCO2 105, PO2 of 90.2, bicarbonate of 38.0, and ABG O2 saturation of 96.1% on 6 L and then repeat  showed 7.239, PCO2 of 91.2, PO2 61.6, bicarbonate level of 27.6, as well as an O2 saturation of 90.9%  -ABG on BiPAP 18/8 with FiO2 40% showed a pH of 7.244, PCO2 of 85.5, PO2 of 68.7, bicarbonate level of 35.7, and O2 saturation of 92.8% -We will repeat ABG in 4 hours -Chest x-ray done on  admission showed "Diffuse patchy opacifications of the lungs probably represents pulmonary edema. Possible small right effusion. Cardiomegaly" -Continue to monitor respiratory status carefully and repeat chest x-ray in a.m. -Continue with nebs as above -Continue BiPAP support for now and wean the supplemental oxygen via nasal cannula -Continuous pulse oximetry and maintain oxygen saturation greater than 88% -We will hold sedating medications including his hydrocodone acetaminophen as well as tizanidine  Acute Exacerbation of Suspeceted COPD; Obesity Hypoventilation Syndrome -Started the patient on Xopenex and Atrovent scheduled every 6 along with Brovana and budesonide -Does not a carry a diagnosis but likely may have Underlying COPD and will need outpatient PFTs  -Had some upper airway wheezing but minimal wheezing in his lungs -We will start steroids with IV Solu-Medrol at 60 every 6h His hypercarbia -We will add doxycycline milligrams p.o. twice daily x5 days -Continue supplemental oxygen and wean to 3 Liters at baseline -WBC on admission was 1.6 and repeat was 11.9 -Repeat CXR in AM   Acute on Chronic Diastoloic CHF -Chest x-ray done on admission showed "Diffuse patchy opacifications of the lungs probably represents pulmonary edema. Possible small right effusion. Cardiomegaly" -BNP on admission was 230.6 and high-sensitivity troponin was 14 initially and repeat was 15.0 -Strict I's/O's Daily Weights -Will start IV Diuresis with Lasix 80 mg BID however cardiology started and increase this to IV 60 mg every 6 -Consulted cardiology for further evaluation recommendations -TEE on 918 which showed an EF of 55 to 60% with mildly dilated mildly dysfunctional RV -Continue with carvedilol 12.5 mg p.o. twice daily -Continue to monitor for signs and symptoms of volume overload -Further care per cardiology  AKI on CKD Stage 3 -Patient's BUN/creatinine on admission was 30/2.41 -Baseline  creatinine is around 1.6-1.9 -Likely in the setting of volume overload -Avoid nephrotoxic medications, contrast dyes, as well as hypotension -Continue diuresis as above -Continue to monitor and trend renal function repeat CMP in a.m.  Paroxysmal atrial fibrillation -Underwent DCCV in 2019 and remains in sinus today -Continue amiodarone 100 mg p.o. daily as well as anticoagulation with Eliquis -Continue with carvedilol 12.5 mg p.o. twice daily -Check TSH  Insulin-Dependent Diabetes Mellitus Type 2 -We will continue Lantus reduced dose of 36 units nightly -Placed on moderate NovoLog sliding scale insulin before meals and at bedtime -Expect blood sugars to elevate in the setting of IV steroid demargination -Recent hemoglobin A1c done was 8.7 -CBGs are currently ranging from 2 91-3 22 -May need diabetes education coordinator consultation  Morbid Obesity -Estimated body mass index is 54.89 kg/m as calculated from the following:   Height as of 02/07/19: 5' 10.5" (1.791 m).   Weight as of 02/07/19: 176 kg. -Weight Loss and Dietary Counseling given  Leukocytosis -The setting of volume overload and suspected COPD exacerbation -WBC was 11.6 admission is now 11.9 -Started doxycycline for COPD exacerbation -Continue to monitor for signs and symptoms of infection -Currently patient is afebrile -Repeat CBC in a.m.  Macrocytic Anemia  -Hemoglobin/hematocrit admission was 12.5/45.9 and repeat was 12.9/45.0 -Check anemia panel in a.m. -Continue monitor for signs and symptoms of bleeding; currently no overt bleeding noted -Repeat CBC in a.m.  History  of MI and CAD status post CABG with LIMA to LAD -Currently not on aspirin given Eliquis -Continue with isosorbide mononitrate 30 mg p.o. daily -Continue rosuvastatin cardiovascular daily  History of Brain Tumor status post craniotomy -Continue with Topiramate 100 mg po qHS to prevent headache  Hx of CVA status post acoustic neuroma  surgery -Continue with statin and Eliquis  DVT prophylaxis: Anticoagulated with apixaban Code Status: FULL CODE Family Communication: No family present at bedside  Disposition Plan: [place] for continued Workup and Treatment Consults called: Cardiology  Admission status: Inpatient SDU  Severity of Illness: The appropriate patient status for this patient is INPATIENT. Inpatient status is judged to be reasonable and necessary in order to provide the required intensity of service to ensure the patient's safety. The patient's presenting symptoms, physical exam findings, and initial radiographic and laboratory data in the context of their chronic comorbidities is felt to place them at high risk for further clinical deterioration. Furthermore, it is not anticipated that the patient will be medically stable for discharge from the hospital within 2 midnights of admission. The following factors support the patient status of inpatient.   " The patient's presenting symptoms include shortness of breath and somnolent. " The worrisome physical exam findings include confusion. " The initial radiographic and laboratory data are worrisome because of hypercarbia and volume overload. " The chronic co-morbidities are listed as above in the HPI  * I certify that at the point of admission it is my clinical judgment that the patient will require inpatient hospital care spanning beyond 2 midnights from the point of admission due to high intensity of service, high risk for further deterioration and high frequency of surveillance required.Kerney Elbe, D.O. [facility] PAGER is on [place]  If 7PM-7AM, please contact night-coverage [email or web address] Password [email or web address]  02/22/2019, 7:30 AM

## 2019-02-22 NOTE — Evaluation (Signed)
Clinical/Bedside Swallow Evaluation Patient Details  Name: [name]. MRN: [id number] Date of Birth: Feb 23, 1961  Today's Date: 02/22/2019 Time: SLP Start Time (ACUTE ONLY): 1400 SLP Stop Time (ACUTE ONLY): 1430 SLP Time Calculation (min) (ACUTE ONLY): 30 min  Past Medical History:  Past Medical History:  Diagnosis Date  . Brain tumor ([place])    Takes Topamax so patient will not have headaches  . Bruises easily   . CAD (coronary artery disease)    Cath 09, 99% LAD  . Cardiomyopathy, ischemic    Ischemic  . CHF (congestive heart failure) ([place])   . Cyst near coccyx   . Cyst near tailbone   . Diabetes mellitus without complication (HCC)    borderline  . Edema of both legs    Takes Lasix  . Fibromyalgia   . Gout   . Headache(784.0)    with lights  . Hypertension    dr Percival Spanish  . Kidney stones   . Myocardial infarction ([place])   . Obesity   . Pneumonia    hx of  . Shortness of breath   . Stroke [facility])    Affected vision, walking, and facial drooping on right face   Past Surgical History:  Past Surgical History:  Procedure Laterality Date  . 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 ANAL FISTULECTOMY    . CARDIOVERSION N/A 05/08/2018   Procedure: CARDIOVERSION;  Surgeon: [name], MD;  Location: [facility] ENDOSCOPY;  Service: Cardiovascular;  Laterality: N/A;  . CORONARY ARTERY BYPASS GRAFT     LIMA to LAD 2009  . CRANIOTOMY Right 11/20/2012   Procedure: CRANIOTOMY REPAIR DURAL/CENTRAL SPINAL FLUID LEAK;  Surgeon: [name], MD;  Location: [place] NEURO ORS;  Service: Neurosurgery;  Laterality: Right;  . EYE SURGERY     to coorect lid and double vision  . MEDIAN RECTUS REPAIR Right 02/16/2016   Procedure: RIGHT LATERAL RECTUS RESECTION; SUPERIOR RECTUS RESECTION RIGHT EYE; RIGHT SUPERIOR RECTUS RECESSION; LATERAL TARSAL STRIP RIGHT LOWER  EYELID;  Surgeon: [name], MD;  Location: [place];  Service: Ophthalmology;  Laterality: Right;  . PLACEMENT OF LUMBAR DRAIN N/A 11/20/2012   Procedure: Attempted PLACEMENT OF LUMBAR DRAIN;  Surgeon: [name], MD;  Location: [place] NEURO ORS;  Service: Neurosurgery;  Laterality: N/A;  . RETROSIGMOID CRANIECTOMY FOR TUMOR RESECTION Right 10/11/2012   Procedure: RETROSIGMOID CRANIECTOMY FOR TUMOR RESECTION;  Surgeon: [name], MD;  Location: [place] NEURO ORS;  Service: Neurosurgery;  Laterality: Right;  Craniotomy for acoustic neuroma  . TEE WITHOUT CARDIOVERSION N/A 05/08/2018   Procedure: TRANSESOPHAGEAL ECHOCARDIOGRAM (TEE);  Surgeon: [name], MD;  Location: [facility] ENDOSCOPY;  Service: Cardiovascular;  Laterality: N/A;  . TRACHEOSTOMY TUBE PLACEMENT N/A 10/11/2012   Procedure: TRACHEOSTOMY;  Surgeon: Ascencion Dike, MD;  Location: [facility] NEURO [facility]S;  Service: ENT;  Laterality: N/A;  . UMBILICAL HERNIA REPAIR     HPI:  Patient is a 58 y.o. male with PMH: brain tumor s/p craniotom, CAD s/p CABG with LIMA to LAD, fibromyalgia, gout, afib, CHF, COPD on 3 L O2, CKD stage III, DM-2, CVA with residual right sided weakness. He presented to hospital with c/o SOB and somnolence and was confused. ABG done in ED which showed respiratory acidosis, CXR showed diffuse patchy opacifications of lungs consistent with probable pulmonary edema. Paitient was placed on 15 L  NRB .   Assessment / Plan / Recommendation Clinical Impression  Patient presents with a mild-moderate oral and a mild pharyngeal dysphagia, which is likely slightly exacerbated from baseline as he has had prior CVA with residual weakness and flaccid right facial and labial musculature. Prior to PO's, during oral care, SLP suctioned significant amount of thick secretions mixed with what appeared to be a medication. Patient was hocking up secretions pharyngeally and suctioning them out of his mouth. He tolerated sips of thin liquids from straw without  overt s/s of aspiration or penetration and with timely swallow initiation. As patient had respiratory distress requiring NRB, h/o CVA and dysphagia, and difficulty managing secretions, will start with thin liquids/clear liquids and work on advancing solids as patient tolerates. SLP Visit Diagnosis: Dysphagia, unspecified (R13.10)    Aspiration Risk  Mild aspiration risk    Diet Recommendation Thin liquid;Other (Comment)(clear liquids)   Liquid Administration via: Cup;Straw Medication Administration: Crushed with puree Supervision: Patient able to self feed;Intermittent supervision to cue for compensatory strategies Compensations: Minimize environmental distractions;Lingual sweep for clearance of pocketing Postural Changes: Seated upright at 90 degrees    Other  Recommendations Oral Care Recommendations: Oral care BID   Follow up Recommendations Other (comment)(TBD pending progress)      Frequency and Duration min 2x/week  1 week       Prognosis Prognosis for Safe Diet Advancement: Good Barriers to Reach Goals: Cognitive deficits      Swallow Study   General Date of Onset: 02/22/19 HPI: Patient is a 58 y.o. male with PMH: brain tumor s/p craniotom, CAD s/p CABG with LIMA to LAD, fibromyalgia, gout, afib, CHF, COPD on 3 L O2, CKD stage III, DM-2, CVA with residual right sided weakness. He presented to hospital with c/o SOB and somnolence and was confused. ABG done in ED which showed respiratory acidosis, CXR showed diffuse patchy opacifications of lungs consistent with probable pulmonary edema. Paitient was placed on 15 L NRB . Type of Study: Bedside Swallow Evaluation Previous Swallow Assessment: remote BSE 2018 Diet Prior to this Study: NPO Temperature Spikes Noted: No Respiratory Status: Nasal cannula History of Recent Intubation: No Behavior/Cognition: Alert;Cooperative;Impulsive;Requires cueing;Distractible Oral Cavity Assessment: Excessive secretions Oral Care Completed by  SLP: Yes Oral Cavity - Dentition: Edentulous Self-Feeding Abilities: Needs assist;Needs set up Patient Positioning: Upright in bed Baseline Vocal Quality: Normal Volitional Cough: Strong Volitional Swallow: Able to elicit    Oral/Motor/Sensory Function Overall Oral Motor/Sensory Function: Moderate impairment Facial ROM: Suspected CN VII (facial) dysfunction;Reduced right Facial Symmetry: Suspected CN VII (facial) dysfunction;Abnormal symmetry right Facial Strength: Suspected CN VII (facial) dysfunction;Reduced right Lingual ROM: Within Functional Limits Lingual Strength: Within Functional Limits   Ice Chips     Thin Liquid Thin Liquid: Within functional limits Presentation: Straw;Self Fed Other Comments: No overt s/s of aspiration or penetration with straw sips of thin liquids.    Nectar Thick     Honey Thick     Puree Puree: Not tested Other Comments: Patient was becoming distracted and requesting personal care and not wanting to participate in purees or solids.   Solid     Solid: Not tested      Nadara Mode Tarrell 02/22/2019,4:21 PM   Sonia Baller, MA, CCC-SLP Speech Therapy [facility] Pager: [phone]

## 2019-02-22 NOTE — Progress Notes (Signed)
*  PRELIMINARY RESULTS* Echocardiogram 2D Echocardiogram limited with definity has been performed.  Leavy Cella 02/22/2019, 1:56 PM

## 2019-02-22 NOTE — Consult Note (Signed)
Cardiology Consultation:   Patient ID: [name]. MRN: [id number]; DOB: 19-Aug-1961  Admit date: 02/22/2019 Date of Consult: 02/22/2019  Primary Care Provider: Caren Macadam, MD Primary Cardiologist: Dr Aundra Dubin  Patient Profile:   [name]. is a 58 y.o. male with a hx of   CAD s/p CABG, chronic diastolic CHFwho is being seen today for the evaluation of acute on chronic CHF at the request of Dr. Veryl Speak.  History of Present Illness:   [name] is a 58 yo with history of CAD s/p CABG, chronic diastolic CHF, atrial fibrillation, obesity, prior CVA s/p acoustic neuroma surgery, HTN,  diabetes, and PAF.  [name] had a single vessel CABG with LIMA-LAD in 2009. In 6/18, [name] was in the hospital with diastolic CHF and atrial fibrillation with RVR. [name] was started on Eliquis that time but not cardioverted. After leaving the hospital, [name] did not continue apixaban and did not have cardiology followup.   Admitted 04/30/2018  with increased dyspnea and mental status changes. [name] was thought to have acute metobolic encephalopathy with hypercapneic respiratory failure. Creatinine initially 4.19, has decreased to 2.15. Last prior creatinine was 1.4 in 6/18. [name] was on Bipap initially. [name] has been in atrial fibrillation with RVR, was started on Coreg but HR still running high. ECHO was nondiagnostic so TEE completed with cardioversion. [name] was placed amiodarone. Diuresed with IV lasix and later transitioned to torsemide 40 mg twice a day. [name] was discharged to [facility].   [name] is followed by CHF clinic, last seen on 02/06/2019 when [name] was doing well. Per note:  Requires assistance with ADLs. Able to walk a little in his house. SOB with exertion. [name] remains on 2 liters. Oxygen. Denies PND/Orthopnea. No chest pain. Appetite ok. No fever or chills. No bleeding issues. Unable to stand to weigh. Taking all medications.  [name] presented to the ER via EMS this morning with  worsening SOB, increased O2 requirements, fatigue to somnolence that started yesterday.   Labs: Crea 2.4 (baseline 1.6-1.9), K 4.4, Na 142, BNP 230, respiratory acidosis with hypercapnia, increase CO2 91, O2 61%. Hb 12.5, Ptl 223, Troponin 14, 15, recent TSH 0.9. Covid negative.  Heart Pathway Score:     Past Medical History:  Diagnosis Date  . Brain tumor ([place])    Takes Topamax so patient will not have headaches  . Bruises easily   . CAD (coronary artery disease)    Cath 09, 99% LAD  . Cardiomyopathy, ischemic    Ischemic  . CHF (congestive heart failure) ([place])   . Cyst near coccyx   . Cyst near tailbone   . Diabetes mellitus without complication (HCC)    borderline  . Edema of both legs    Takes Lasix  . Fibromyalgia   . Gout   . Headache(784.0)    with lights  . Hypertension    dr Percival Spanish  . Kidney stones   . Myocardial infarction ([place])   . Obesity   . Pneumonia    hx of  . Shortness of breath   . Stroke [facility])    Affected vision, walking, and facial drooping on right face   Past Surgical History:  Procedure Laterality Date  . 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 ANAL FISTULECTOMY    . CARDIOVERSION N/A 05/08/2018   Procedure: CARDIOVERSION;  Surgeon: [name], MD;  Location: [facility] ENDOSCOPY;  Service: Cardiovascular;  Laterality: N/A;  . CORONARY ARTERY BYPASS GRAFT     LIMA to LAD 2009  . CRANIOTOMY Right 11/20/2012   Procedure: CRANIOTOMY REPAIR DURAL/CENTRAL SPINAL FLUID LEAK;  Surgeon: [name], MD;  Location: [place] NEURO ORS;  Service: Neurosurgery;  Laterality: Right;  . EYE SURGERY     to coorect lid and double vision  . MEDIAN RECTUS REPAIR Right 02/16/2016   Procedure: RIGHT LATERAL RECTUS RESECTION; SUPERIOR RECTUS RESECTION RIGHT EYE; RIGHT SUPERIOR RECTUS RECESSION; LATERAL TARSAL STRIP RIGHT LOWER EYELID;  Surgeon:  [name], MD;  Location: [place];  Service: Ophthalmology;  Laterality: Right;  . PLACEMENT OF LUMBAR DRAIN N/A 11/20/2012   Procedure: Attempted PLACEMENT OF LUMBAR DRAIN;  Surgeon: [name], MD;  Location: [place] NEURO ORS;  Service: Neurosurgery;  Laterality: N/A;  . RETROSIGMOID CRANIECTOMY FOR TUMOR RESECTION Right 10/11/2012   Procedure: RETROSIGMOID CRANIECTOMY FOR TUMOR RESECTION;  Surgeon: [name], MD;  Location: [place] NEURO ORS;  Service: Neurosurgery;  Laterality: Right;  Craniotomy for acoustic neuroma  . TEE WITHOUT CARDIOVERSION N/A 05/08/2018   Procedure: TRANSESOPHAGEAL ECHOCARDIOGRAM (TEE);  Surgeon: [name], MD;  Location: [facility] ENDOSCOPY;  Service: Cardiovascular;  Laterality: N/A;  . TRACHEOSTOMY TUBE PLACEMENT N/A 10/11/2012   Procedure: TRACHEOSTOMY;  Surgeon: Ascencion Dike, MD;  Location: [facility] NEURO ORS;  Service: ENT;  Laterality: N/A;  . UMBILICAL HERNIA REPAIR      Inpatient Medications: Scheduled Meds: . amiodarone  100 mg Oral Daily  . apixaban  5 mg Oral BID  . arformoterol  15 mcg Nebulization BID  . budesonide (PULMICORT) nebulizer solution  0.25 mg Nebulization BID  . carvedilol  12.5 mg Oral BID WC  . furosemide  60 mg Intravenous Q12H  . guaiFENesin  1,200 mg Oral BID  . insulin aspart  0-15 Units Subcutaneous TID WC  . insulin aspart  0-5 Units Subcutaneous QHS  . insulin glargine  36 Units Subcutaneous Daily  . ipratropium  0.5 mg Nebulization Q6H  . isosorbide mononitrate  30 mg Oral Daily  . levalbuterol  0.63 mg Nebulization Q6H  . methylPREDNISolone (SOLU-MEDROL) injection  60 mg Intravenous Q6H  . sodium chloride flush  3 mL Intravenous Q12H  . topiramate  100 mg Oral QHS   Continuous Infusions: . sodium chloride     PRN Meds: sodium chloride, acetaminophen **OR** acetaminophen, artificial tears, ondansetron **OR** ondansetron (ZOFRAN) IV, polyethylene glycol, polyvinyl alcohol, sodium chloride flush  Allergies:    Allergies   Allergen Reactions  . Morphine And Related Anaphylaxis    "makes me stop breathing"  . Novocain [Procaine] Anaphylaxis  . Carbamazepine     shaking  . Gabapentin Other (See Comments)    Blood in urine  . Ivp Dye [Iodinated Diagnostic Agents]     Passing out  . Other     Steroids makes hearts race   Social History:   Social History   Socioeconomic History  . Marital status: Married    Spouse name: Not on file  . Number of children: Not on file  . Years of education: Not on file  . Highest education level: Not on file  Occupational History  . Not on file  Social Needs  . Financial resource strain: Not on file  . Food insecurity    Worry: Not on file    Inability: Not on file  . Transportation needs    Medical:  Not on file    Non-medical: Not on file  Tobacco Use  . Smoking status: Former Smoker    Packs/day: 0.25    Years: 20.00    Pack years: 5.00    Types: Cigarettes  . Smokeless tobacco: Current User  . Tobacco comment: Rare tobacco.  "[name]".  Substance and Sexual Activity  . Alcohol use: No    Alcohol/week: 0.0 standard drinks  . Drug use: No  . Sexual activity: Yes  Lifestyle  . Physical activity    Days per week: Not on file    Minutes per session: Not on file  . Stress: Not on file  Relationships  . Social Herbalist on phone: Not on file    Gets together: Not on file    Attends religious service: Not on file    Active member of club or organization: Not on file    Attends meetings of clubs or organizations: Not on file    Relationship status: Not on file  . Intimate partner violence    Fear of current or ex partner: Not on file    Emotionally abused: Not on file    Physically abused: Not on file    Forced sexual activity: Not on file  Other Topics Concern  . Not on file  Social History Narrative  . Not on file    Family History:    Family History  Problem Relation Age of Onset  . CAD Father   . Hypertension Mother    . Cancer Other        multiple relatives  . Diabetes Maternal Grandmother   . Diabetes Maternal Aunt   . Leukemia Cousin   . Leukemia Maternal Grandfather      ROS:  Please see the history of present illness.  All other ROS reviewed and negative.     Physical Exam/Data:   Vitals:   02/22/19 0630 02/22/19 0700 02/22/19 0830 02/22/19 0833  BP: (!) 131/54 (!) 155/75    Pulse: 69 75  62  Resp: (!) 24 (!) 22  (!) 24  Temp:      TempSrc:      SpO2: 94% 93% 97% 97%   No intake or output data in the 24 hours ending 02/22/19 0904 Last 3 Weights 02/07/2019 02/06/2019 12/06/2018  Weight (lbs) 388 lb 388 lb 377 lb  Weight (kg) 175.996 kg 175.996 kg 171.006 kg     There is no height or weight on file to calculate BMI.  General:  Somnolent, on CPAP HEENT: normal Lymph: no adenopathy Neck: JVD unable to assess sec to obesity Endocrine:  No thryomegaly Vascular: No carotid bruits; FA pulses 2+ bilaterally without bruits  Cardiac:  normal S1, S2; RRR; no murmur  Lungs: crackles bilaterally, no wheezing, rhonchi or rales  Abd: distended Ext: 2+ non-pitting edema Musculoskeletal:  No deformities, BUE and BLE strength normal and equal Skin: warm and dry  Neuro:  CNs 2-12 intact, no focal abnormalities noted Psych:  Normal affect   EKG:  The EKG was personally reviewed and demonstrates:  SR, iRBBB, LPFP, unchanged from prior Telemetry:  Telemetry was personally reviewed and demonstrates:  SR  Laboratory Data:  High Sensitivity Troponin:   Recent Labs  Lab 02/22/19 0324 02/22/19 0524  TROPONINIHS 14 15.0     Cardiac EnzymesNo results for input(s): TROPONINI in the last 168 hours. No results for input(s): TROPIPOC in the last 168 hours.  Chemistry Recent Labs  Lab 02/22/19 0324  NA 142  K 4.4  CL 99  CO2 34*  GLUCOSE 243*  BUN 30*  CREATININE 2.41*  CALCIUM 8.5*  GFRNONAA 29*  GFRAA 33*  ANIONGAP 9    Recent Labs  Lab 02/22/19 0324  PROT 8.1  ALBUMIN 3.4*  AST 21   ALT 25  ALKPHOS 231*  BILITOT 0.2*   Hematology Recent Labs  Lab 02/22/19 0324  WBC 11.6*  RBC 4.25  HGB 12.5*  HCT 45.9  MCV 108.0*  MCH 29.4  MCHC 27.2*  RDW 12.7  PLT 223   BNP Recent Labs  Lab 02/22/19 0324  BNP 230.6*    DDimer No results for input(s): DDIMER in the last 168 hours.   Radiology/Studies:  Dg Chest Port 1 View  Result Date: 02/22/2019 CLINICAL DATA:  58 y/o M; history of COPD and CHF. Shortness of breath. EXAM: PORTABLE CHEST 1 VIEW COMPARISON:  04/30/2018 chest radiograph. FINDINGS: Cardiomegaly, CABG, and median sternotomy postsurgical changes. Diffuse patchy opacifications of the lungs. Possible small right effusion. No pneumothorax. No acute osseous abnormality is evident. IMPRESSION: Diffuse patchy opacifications of the lungs probably represents pulmonary edema. Possible small right effusion. Cardiomegaly. Electronically Signed   By: Kristine Garbe M.D.   On: 02/22/2019 04:09   TTE: 05/08/2018  - Left ventricle: The cavity size was normal. Wall thickness was   increased in a pattern of mild LVH. Systolic function was normal.   The estimated ejection fraction was in the range of 55% to 60%.   Wall motion was normal; there were no regional wall motion   abnormalities. No evidence of thrombus. - Aortic valve: There was no stenosis. - Aorta: Normal caliber aorta with mild plaque. - Mitral valve: There was trivial regurgitation. - Left atrium: The atrium was mildly dilated. No evidence of   thrombus in the atrial cavity or appendage. No evidence of   thrombus in the atrial cavity or appendage. - Right ventricle: The cavity size was mildly dilated. Systolic   function was mildly reduced. D-shaped interventricular septum   suggestive of RV pressure/volume overload. - Right atrium: The atrium was mildly dilated. - Atrial septum: No PFO or ASD by color doppler. - Tricuspid valve: Trivial TR, could not obtain complete doppler   jet so no PA  pressure estimation available. - Pericardium, extracardiac: A trivial pericardial effusion was   identified.    Assessment and Plan:   1.  Acute on Chronic Diastolic CHF: TEE 3/78 showed EF 55-60% with mildly dilated/mildly dysfunctional RV. NYHA III chronically.  -weight bellow his baseline, however clear fluid overload and severe pulmnary edema on CXR -difficult to assess compliance, [name] is non-arousable -increase lasix to 60 mg iv Q6H - crea up at 2.4 from 1.6 (baseline 1.6-1.9)  2. Acute hypercapnic respiratory failure - this is sec to morbid obesity with hypoventilation syndrome, refusal of sleep apnea evaluation. -overall prognosis very poor unless [name] takes dramatic approach to his obesity and lifestyle, including sleep apnea evaluation.  2. PAF:  - S/P DC-CV 04/2018. in SR today.  - Continue amiodarone to 100 mg daily. TSH normal on 02/06/19.  - Continue eliquis 5 mg twice a day.   4. Acute on chronic kidney failure - we will follow  5. CAD: S/p LIMA-LAD in 2009. -No s/s ischemia.  - No ASA given Eliquis use.  - Continue rosuvastatin 20 mg  6. H/O metabolic encephalopathy: - Wearing O2 continuously. Not interested in sleep study.  7.  HTN  -  elevated, increase lasix for now.  8. Obesity Body mass index is 55.67 kg/m.   For questions or updates, please contact [place] Please consult [email or web address] for contact info under   Signed, Ena Dawley, MD  02/22/2019 9:04 AM

## 2019-02-22 NOTE — ED Notes (Signed)
ED TO INPATIENT HANDOFF REPORT  Name/Age/Gender [name]. 58 y.o. male  Code Status Code Status History    Date Active Date Inactive Code Status Order ID Comments User Context   05/01/2018 0010 05/10/2018 1942 Full Code [id number]  Welford Roche, MD ED   01/28/2017 1328 02/03/2017 1658 Full Code [id number]  Elwin Mocha, MD ED   02/16/2016 1655 02/23/2016 2104 Full Code [id number]  Raylene Miyamoto, MD Inpatient   10/22/2012 2005 11/12/2012 1754 Full Code [id number]  Erling Cruz Pecolia Ades Inpatient   10/12/2012 0058 10/22/2012 2005 Full Code [id number]  [name], MD Inpatient   Advance Care Planning Activity      Home/SNF/Other Home  Chief Complaint copd and SOB   Level of Care/Admitting Diagnosis ED Disposition    ED Disposition Condition [facility] Area: [facility] [[id number]]  Level of Care: Stepdown [14]  Admit to SDU based on following criteria: Respiratory Distress:  Frequent assessment and/or intervention to maintain adequate ventilation/respiration, pulmonary toilet, and respiratory treatment.  Covid Evaluation: N/A  Diagnosis: Acute respiratory failure with hypoxia and hypercapnia [facility]) [[id number]]  Admitting Physician: Rise Patience [phone]  Attending Physician: Rise Patience [phone]  Estimated length of stay: past midnight tomorrow  Certification:: I certify this patient will need inpatient services for at least 2 midnights  PT Class (Do Not Modify): Inpatient [101]  PT Acc Code (Do Not Modify): Private [1]       Medical History Past Medical History:  Diagnosis Date  . Brain tumor ([employer])    Takes Topamax so patient will not have headaches  . Bruises easily   . CAD (coronary artery disease)    Cath 09, 99% LAD  . Cardiomyopathy, ischemic    Ischemic  . CHF (congestive heart failure) ([place])   . Cyst near coccyx   . Cyst near tailbone   . Diabetes mellitus without complication (HCC)     borderline  . Edema of both legs    Takes Lasix  . Fibromyalgia   . Gout   . Headache(784.0)    with lights  . Hypertension    dr Percival Spanish  . Kidney stones   . Myocardial infarction ([place])   . Obesity   . Pneumonia    hx of  . Shortness of breath   . Stroke [facility])    Affected vision, walking, and facial drooping on right face    Allergies Allergies  Allergen Reactions  . Morphine And Related Anaphylaxis    "makes me stop breathing"  . Novocain [Procaine] Anaphylaxis  . Carbamazepine     shaking  . Gabapentin Other (See Comments)    Blood in urine  . Ivp Dye [Iodinated Diagnostic Agents]     Passing out  . Other     Steroids makes hearts race    IV Location/Drains/Wounds Patient Lines/Drains/Airways Status   Active Line/Drains/Airways    Name:   Placement date:   Placement time:   Site:   Days:   Peripheral IV 05/08/18 Left;Lateral Hand   05/08/18    0406    Hand   290   Peripheral IV 02/22/19 Left Hand   02/22/19    0316    Hand   less than 1   External Urinary Catheter   05/01/18    1805    -   297   External Urinary Catheter   02/22/19    0647    -  less than 1   Incision 10/11/12 Head Right   10/11/12    1118     2325   Incision 11/20/12 Neck Anterior   11/20/12    0700     2285   Incision 11/20/12 Head Right   11/20/12    2127     2285   Incision 11/20/12 Back Other (Comment)   11/20/12    2127     2285   Incision (Closed) 02/16/16 Eye Right   02/16/16    1057     1102   Incision (Closed) 02/16/16 Face Other (Comment)   02/16/16    1057     1102   Pressure Ulcer 10/23/12 Deep Tissue Injury - Purple or maroon localized area of discolored intact skin or blood-filled blister due to damage of underlying soft tissue from pressure and/or shear. Area located to buttocks and sacral region. Site is nonblan   10/23/12    0820     2313   Pressure Ulcer 10/23/12 Stage II -  Partial thickness loss of dermis presenting as a shallow open ulcer with a red, pink wound bed without  slough. OPen area 3.5x2.0 x0.125cm ; peri area red, blanachable extends 14.0x6.5cms   10/23/12    0820     2313   Pressure Ulcer 11/22/12 Stage I -  Intact skin with non-blanchable redness of a localized area usually over a bony prominence.   11/22/12    -     2283   Pressure Ulcer Stage II -  Partial thickness loss of dermis presenting as a shallow open ulcer with a red, pink wound bed without slough.   -    -        Pressure Injury 01/28/17 Stage II -  Partial thickness loss of dermis presenting as a shallow open ulcer with a red, pink wound bed without slough.   01/28/17    2032     755   Pressure Injury 05/01/18 Stage II -  Partial thickness loss of dermis presenting as a shallow open ulcer with a red, pink wound bed without slough. open sore with some slough and scant drainage   05/01/18    1400     297   Wound 11/09/12 Skin tear Hand Left   11/09/12    1600    Hand   2296   Wound 11/20/12 Skin tear Arm Left scabbed old skin tear   11/20/12    0700    Arm   2285          Labs/Imaging Results for orders placed or performed during the hospital encounter of 02/22/19 (from the past 48 hour(s))  Comprehensive metabolic panel     Status: Abnormal   Collection Time: 02/22/19  3:24 AM  Result Value Ref Range   Sodium 142 135 - 145 mmol/L   Potassium 4.4 3.5 - 5.1 mmol/L   Chloride 99 98 - 111 mmol/L   CO2 34 (H) 22 - 32 mmol/L   Glucose, Bld 243 (H) 70 - 99 mg/dL   BUN 30 (H) 6 - 20 mg/dL   Creatinine, Ser 2.41 (H) 0.61 - 1.24 mg/dL   Calcium 8.5 (L) 8.9 - 10.3 mg/dL   Total Protein 8.1 6.5 - 8.1 g/dL   Albumin 3.4 (L) 3.5 - 5.0 g/dL   AST 21 15 - 41 U/L   ALT 25 0 - 44 U/L   Alkaline Phosphatase 231 (H) 38 - 126 U/L   Total Bilirubin  0.2 (L) 0.3 - 1.2 mg/dL   GFR calc non Af Amer 29 (L) >60 mL/min   GFR calc Af Amer 33 (L) >60 mL/min   Anion gap 9 5 - 15    Comment: Performed at [facility], [place] [address], [place], [place] [address]  CBC with Differential      Status: Abnormal   Collection Time: 02/22/19  3:24 AM  Result Value Ref Range   WBC 11.6 (H) 4.0 - 10.5 K/uL   RBC 4.25 4.22 - 5.81 MIL/uL   Hemoglobin 12.5 (L) 13.0 - 17.0 g/dL   HCT 45.9 39.0 - 52.0 %   MCV 108.0 (H) 80.0 - 100.0 fL   MCH 29.4 26.0 - 34.0 pg   MCHC 27.2 (L) 30.0 - 36.0 g/dL   RDW 12.7 11.5 - 15.5 %   Platelets 223 150 - 400 K/uL   nRBC 0.0 0.0 - 0.2 %   Neutrophils Relative % 74 %   Neutro Abs 8.7 (H) 1.7 - 7.7 K/uL   Lymphocytes Relative 10 %   Lymphs Abs 1.2 0.7 - 4.0 K/uL   Monocytes Relative 9 %   Monocytes Absolute 1.0 0.1 - 1.0 K/uL   Eosinophils Relative 4 %   Eosinophils Absolute 0.5 0.0 - 0.5 K/uL   Basophils Relative 1 %   Basophils Absolute 0.1 0.0 - 0.1 K/uL   Immature Granulocytes 2 %   Abs Immature Granulocytes 0.22 (H) 0.00 - 0.07 K/uL    Comment: Performed at [facility], [place] [address], [place], [place] [address]  Brain natriuretic peptide     Status: Abnormal   Collection Time: 02/22/19  3:24 AM  Result Value Ref Range   B Natriuretic Peptide 230.6 (H) 0.0 - 100.0 pg/mL    Comment: Performed at [facility], [place] [address], [place], [place] [address]  Troponin I (High Sensitivity)     Status: None   Collection Time: 02/22/19  3:24 AM  Result Value Ref Range   Troponin I (High Sensitivity) 14 <18 ng/L    Comment: (NOTE) Elevated high sensitivity troponin I (hsTnI) values and significant  changes across serial measurements may suggest ACS but many other  chronic and acute conditions are known to elevate hsTnI results.  Refer to the "Links" section for chest pain algorithms and additional  guidance. Performed at [facility], [place] [address], [place], [place] [address]   SARS Coronavirus 2 (CEPHEID- Performed in [place] hospital lab), Hosp Order     Status: None   Collection Time: 02/22/19  3:24 AM   Specimen: Nasopharyngeal Swab  Result Value Ref Range   SARS Coronavirus 2  NEGATIVE NEGATIVE    Comment: (NOTE) If result is NEGATIVE SARS-CoV-2 target nucleic acids are NOT DETECTED. The SARS-CoV-2 RNA is generally detectable in upper and lower  respiratory specimens during the acute phase of infection. The lowest  concentration of SARS-CoV-2 viral copies this assay can detect is 250  copies / mL. A negative result does not preclude SARS-CoV-2 infection  and should not be used as the sole basis for treatment or other  patient management decisions.  A negative result may occur with  improper specimen collection / handling, submission of specimen other  than nasopharyngeal swab, presence of viral mutation(s) within the  areas targeted by this assay, and inadequate number of viral copies  (<250 copies / mL). A negative result must be combined with clinical  observations, patient history, and  epidemiological information. If result is POSITIVE SARS-CoV-2 target nucleic acids are DETECTED. The SARS-CoV-2 RNA is generally detectable in upper and lower  respiratory specimens dur ing the acute phase of infection.  Positive  results are indicative of active infection with SARS-CoV-2.  Clinical  correlation with patient history and other diagnostic information is  necessary to determine patient infection status.  Positive results do  not rule out bacterial infection or co-infection with other viruses. If result is PRESUMPTIVE POSTIVE SARS-CoV-2 nucleic acids MAY BE PRESENT.   A presumptive positive result was obtained on the submitted specimen  and confirmed on repeat testing.  While 2019 novel coronavirus  (SARS-CoV-2) nucleic acids may be present in the submitted sample  additional confirmatory testing may be necessary for epidemiological  and / or clinical management purposes  to differentiate between  SARS-CoV-2 and other Sarbecovirus currently known to infect humans.  If clinically indicated additional testing with an alternate test  methodology [id number]) is  advised. The SARS-CoV-2 RNA is generally  detectable in upper and lower respiratory sp ecimens during the acute  phase of infection. The expected result is Negative. Fact Sheet for Patients:  StrictlyIdeas.no Fact Sheet for Healthcare Providers: BankingDealers.co.za This test is not yet approved or cleared by the [place] FDA and has been authorized for detection and/or diagnosis of SARS-CoV-2 by FDA under an Emergency Use Authorization (EUA).  This EUA will remain in effect (meaning this test can be used) for the duration of the COVID-19 declaration under Section 564(b)(1) of the Act, 21 U.S.C. section 360bbb-3(b)(1), unless the authorization is terminated or revoked sooner. Performed at [facility], [place] [address], [place], [place] [address]   Blood gas, arterial     Status: Abnormal   Collection Time: 02/22/19  3:32 AM  Result Value Ref Range   O2 Content 6.0 L/min   Delivery systems NASAL CANNULA    pH, Arterial 7.184 (LL) 7.350 - 7.450    Comment: CRITICAL RESULT CALLED TO, READ BACK BY AND VERIFIED WITH:  DR.DELO, MD AT 0345 BY JESSICA NEUGENT,RRT,RCP ON 02/22/2019    pCO2 arterial 105 (HH) 32.0 - 48.0 mmHg    Comment: CRITICAL RESULT CALLED TO, READ BACK BY AND VERIFIED WITH:  DR. Stark Jock, MD AT 0345 BY JESSICA NEUGENT,RRT,RCP ON 02/22/2019    pO2, Arterial 90.2 83.0 - 108.0 mmHg   Bicarbonate 38.0 (H) 20.0 - 28.0 mmol/L   Acid-Base Excess 6.1 (H) 0.0 - 2.0 mmol/L   O2 Saturation 96.1 %   Patient temperature 98.6    Collection site RIGHT RADIAL    Drawn by [id number]    Sample type ARTERIAL DRAW    Allens test (pass/fail) PASS PASS    Comment: Performed at [facility], [place] [address], [place], [place] [address]  Troponin I (High Sensitivity)     Status: None   Collection Time: 02/22/19  5:24 AM  Result Value Ref Range   Troponin I (High Sensitivity) 15.0 <18 ng/L    Comment:  (NOTE) Elevated high sensitivity troponin I (hsTnI) values and significant  changes across serial measurements may suggest ACS but many other  chronic and acute conditions are known to elevate hsTnI results.  Refer to the "Links" section for chest pain algorithms and additional  guidance. Performed at [facility], [place] [address], [place], [place] [address]   Blood gas, arterial     Status: Abnormal   Collection Time: 02/22/19  6:08 AM  Result Value Ref Range  O2 Content 6.0 L/min   Delivery systems NASAL CANNULA    pH, Arterial 7.239 (L) 7.350 - 7.450   pCO2 arterial 91.2 (HH) 32.0 - 48.0 mmHg    Comment: CRITICAL RESULT CALLED TO, READ BACK BY AND VERIFIED WITH:  DR. Stark Jock, MD AT 0620 BY JESSICA NEUGENT,RRT,RCP ON 02/22/2019    pO2, Arterial 61.6 (L) 83.0 - 108.0 mmHg   Bicarbonate 37.6 (H) 20.0 - 28.0 mmol/L   Acid-Base Excess 7.1 (H) 0.0 - 2.0 mmol/L   O2 Saturation 90.9 %   Patient temperature 98.6    Collection site LEFT RADIAL    Drawn by [id number]    Sample type ARTERIAL DRAW    Allens test (pass/fail) PASS PASS    Comment: Performed at [facility], [place] [address], [place], [place] [address]   Dg Chest Port 1 View  Result Date: 02/22/2019 CLINICAL DATA:  58 y/o M; history of COPD and CHF. Shortness of breath. EXAM: PORTABLE CHEST 1 VIEW COMPARISON:  04/30/2018 chest radiograph. FINDINGS: Cardiomegaly, CABG, and median sternotomy postsurgical changes. Diffuse patchy opacifications of the lungs. Possible small right effusion. No pneumothorax. No acute osseous abnormality is evident. IMPRESSION: Diffuse patchy opacifications of the lungs probably represents pulmonary edema. Possible small right effusion. Cardiomegaly. Electronically Signed   By: Kristine Garbe M.D.   On: 02/22/2019 04:09    Pending Labs Unresulted Labs (From admission, onward)   None      Vitals/Pain Today's Vitals   02/22/19 0313 02/22/19 0543 02/22/19 0545  02/22/19 0630  BP: (!) 160/64 137/63 137/63 (!) 131/54  Pulse: 78 70 76 69  Resp: (!) 26 14 20  (!) 24  Temp: 97.9 F (36.6 C) 97.9 F (36.6 C)    TempSrc: Oral Oral    SpO2: 100% 95% 94% 94%  PainSc: 0-No pain 0-No pain      Isolation Precautions Airborne and Contact precautions  Medications Medications  furosemide (LASIX) injection 40 mg (40 mg Intravenous Given 02/22/19 0603)  methylPREDNISolone sodium succinate (SOLU-MEDROL) 125 mg/2 mL injection 125 mg (125 mg Intravenous Given 02/22/19 0600)  albuterol (VENTOLIN HFA) 108 (90 Base) MCG/ACT inhaler 6 puff (6 puffs Inhalation Given 02/22/19 0601)    Mobility walks with device

## 2019-02-22 NOTE — ED Triage Notes (Signed)
Pt comes to ed via ems, Pt called out today 1: 17 am this morning. Pt has hx of COPD and CHF, diabetes type 1.  Pts baseline is 3 liters 02 at home but pt started feeling sob, wife called ems.  Pt uses a walker, alert x4. Pt took a pain  pill tonight and ems says wife said he is more sleepy than usual.   V/s bp 140/72. pluses 74, cbg 251, spo2 98 on 15 liters non re breather, temp 97.3.

## 2019-02-22 NOTE — ED Notes (Signed)
Bed: SS04 Expected date:  Expected time:  Means of arrival:  Comments: EMS 58 yo male from home/SOB-hx CHF-wheezing upper fields-NRB at 15l-O2 sat 98%

## 2019-02-22 NOTE — ED Notes (Signed)
Rpt called to [facility]

## 2019-02-22 NOTE — Progress Notes (Signed)
Pt refusing BIPAP QHS at this time, RT to monitor and assess as needed.

## 2019-02-23 ENCOUNTER — Inpatient Hospital Stay (HOSPITAL_COMMUNITY): Payer: HMO

## 2019-02-23 DIAGNOSIS — I5033 Acute on chronic diastolic (congestive) heart failure: Secondary | ICD-10-CM | POA: Diagnosis present

## 2019-02-23 DIAGNOSIS — N171 Acute kidney failure with acute cortical necrosis: Secondary | ICD-10-CM

## 2019-02-23 DIAGNOSIS — J9621 Acute and chronic respiratory failure with hypoxia: Principal | ICD-10-CM

## 2019-02-23 DIAGNOSIS — J9622 Acute and chronic respiratory failure with hypercapnia: Secondary | ICD-10-CM

## 2019-02-23 DIAGNOSIS — E872 Acidosis: Secondary | ICD-10-CM

## 2019-02-23 DIAGNOSIS — N184 Chronic kidney disease, stage 4 (severe): Secondary | ICD-10-CM

## 2019-02-23 DIAGNOSIS — J441 Chronic obstructive pulmonary disease with (acute) exacerbation: Secondary | ICD-10-CM

## 2019-02-23 LAB — COMPREHENSIVE METABOLIC PANEL
ALT: 22 U/L (ref 0–44)
AST: 15 U/L (ref 15–41)
Albumin: 3.1 g/dL — ABNORMAL LOW (ref 3.5–5.0)
Alkaline Phosphatase: 201 U/L — ABNORMAL HIGH (ref 38–126)
Anion gap: 11 (ref 5–15)
BUN: 39 mg/dL — ABNORMAL HIGH (ref 6–20)
CO2: 32 mmol/L (ref 22–32)
Calcium: 8.8 mg/dL — ABNORMAL LOW (ref 8.9–10.3)
Chloride: 97 mmol/L — ABNORMAL LOW (ref 98–111)
Creatinine, Ser: 2.6 mg/dL — ABNORMAL HIGH (ref 0.61–1.24)
GFR calc Af Amer: 30 mL/min — ABNORMAL LOW (ref >60)
GFR calc non Af Amer: 26 mL/min — ABNORMAL LOW (ref >60)
Glucose, Bld: 281 mg/dL — ABNORMAL HIGH (ref 70–99)
Potassium: 3.6 mmol/L (ref 3.5–5.1)
Sodium: 140 mmol/L (ref 135–145)
Total Bilirubin: 0.8 mg/dL (ref 0.3–1.2)
Total Protein: 8.2 g/dL — ABNORMAL HIGH (ref 6.5–8.1)

## 2019-02-23 LAB — GLUCOSE, CAPILLARY
Glucose-Capillary: 336 mg/dL — ABNORMAL HIGH (ref 70–99)
Glucose-Capillary: 311 mg/dL — ABNORMAL HIGH (ref 70–99)
Glucose-Capillary: 300 mg/dL — ABNORMAL HIGH (ref 70–99)
Glucose-Capillary: 290 mg/dL — ABNORMAL HIGH (ref 70–99)

## 2019-02-23 LAB — CBC
HCT: 45.1 % (ref 39.0–52.0)
Hemoglobin: 12.7 g/dL — ABNORMAL LOW (ref 13.0–17.0)
MCH: 29.5 pg (ref 26.0–34.0)
MCHC: 28.2 g/dL — ABNORMAL LOW (ref 30.0–36.0)
MCV: 104.9 fL — ABNORMAL HIGH (ref 80.0–100.0)
Platelets: 272 10*3/uL (ref 150–400)
RBC: 4.3 MIL/uL (ref 4.22–5.81)
RDW: 12.6 % (ref 11.5–15.5)
WBC: 14.7 10*3/uL — ABNORMAL HIGH (ref 4.0–10.5)
nRBC: 0 % (ref 0.0–0.2)

## 2019-02-23 MED ORDER — FUROSEMIDE 10 MG/ML IJ SOLN
60.0000 mg | Freq: Two times a day (BID) | INTRAMUSCULAR | Status: DC
  Administered 2019-02-23 – 2019-02-25 (×4): 60 mg via INTRAVENOUS
  Filled 2019-02-23 (×4): qty 6

## 2019-02-23 MED ORDER — METHYLPREDNISOLONE SODIUM SUCC 40 MG IJ SOLR
40.0000 mg | Freq: Three times a day (TID) | INTRAMUSCULAR | Status: DC
  Administered 2019-02-23 – 2019-02-24 (×3): 40 mg via INTRAVENOUS
  Filled 2019-02-23 (×3): qty 1

## 2019-02-23 MED ORDER — ADULT MULTIVITAMIN W/MINERALS CH
1.0000 | ORAL_TABLET | Freq: Every day | ORAL | Status: DC
  Administered 2019-02-23 – 2019-02-27 (×5): 1 via ORAL
  Filled 2019-02-23 (×5): qty 1

## 2019-02-23 MED ORDER — HYDRALAZINE HCL 20 MG/ML IJ SOLN
10.0000 mg | Freq: Four times a day (QID) | INTRAMUSCULAR | Status: DC | PRN
  Administered 2019-02-23: 10 mg via INTRAVENOUS
  Filled 2019-02-23: qty 1

## 2019-02-23 MED ORDER — HYDROCODONE-ACETAMINOPHEN 5-325 MG PO TABS
1.0000 | ORAL_TABLET | Freq: Four times a day (QID) | ORAL | Status: DC | PRN
  Administered 2019-02-23 – 2019-02-27 (×10): 1 via ORAL
  Filled 2019-02-23 (×10): qty 1

## 2019-02-23 MED ORDER — PRO-STAT SUGAR FREE PO LIQD
30.0000 mL | Freq: Three times a day (TID) | ORAL | Status: DC
  Administered 2019-02-23 – 2019-02-27 (×13): 30 mL via ORAL
  Filled 2019-02-23 (×13): qty 30

## 2019-02-23 MED ORDER — ENSURE MAX PROTEIN PO LIQD
11.0000 [oz_av] | Freq: Every day | ORAL | Status: DC
  Administered 2019-02-23 – 2019-02-25 (×2): 11 [oz_av] via ORAL
  Filled 2019-02-23 (×6): qty 330

## 2019-02-23 MED ORDER — INSULIN GLARGINE 100 UNIT/ML ~~LOC~~ SOLN
40.0000 [IU] | Freq: Every day | SUBCUTANEOUS | Status: DC
  Administered 2019-02-23 – 2019-02-26 (×4): 40 [IU] via SUBCUTANEOUS
  Filled 2019-02-23 (×5): qty 0.4

## 2019-02-23 MED ORDER — INSULIN ASPART 100 UNIT/ML ~~LOC~~ SOLN
4.0000 [IU] | Freq: Three times a day (TID) | SUBCUTANEOUS | Status: DC
  Administered 2019-02-23 – 2019-02-25 (×6): 4 [IU] via SUBCUTANEOUS

## 2019-02-23 NOTE — Progress Notes (Signed)
Progress Note  Patient Name: [name]. Date of Encounter: 02/23/2019  Primary Cardiologist: Dr Aundra Dubin  Subjective   Mild improvement in SOB so far. No chest pain. Refuses to use BiPAP.  Inpatient Medications    Scheduled Meds:  amiodarone  100 mg Oral Daily   apixaban  5 mg Oral BID   arformoterol  15 mcg Nebulization BID   budesonide (PULMICORT) nebulizer solution  0.25 mg Nebulization BID   carvedilol  12.5 mg Oral BID WC   chlorhexidine  15 mL Mouth Rinse BID   Chlorhexidine Gluconate Cloth  6 each Topical Daily   doxycycline  100 mg Oral Q12H   furosemide  60 mg Intravenous Q6H   guaiFENesin  1,200 mg Oral BID   insulin aspart  0-15 Units Subcutaneous TID WC   insulin aspart  0-5 Units Subcutaneous QHS   insulin aspart  4 Units Subcutaneous TID WC   insulin glargine  40 Units Subcutaneous QHS   isosorbide mononitrate  30 mg Oral Daily   levalbuterol  0.63 mg Nebulization BID   mouth rinse  15 mL Mouth Rinse q12n4p   methylPREDNISolone (SOLU-MEDROL) injection  40 mg Intravenous Q8H   sodium chloride flush  3 mL Intravenous Q12H   topiramate  100 mg Oral QHS   Continuous Infusions:  sodium chloride     PRN Meds: sodium chloride, acetaminophen **OR** acetaminophen, artificial tears, hydrALAZINE, HYDROcodone-acetaminophen, ipratropium, ondansetron **OR** ondansetron (ZOFRAN) IV, polyethylene glycol, polyvinyl alcohol, sodium chloride flush   Vital Signs    Vitals:   02/23/19 0715 02/23/19 0810 02/23/19 0846 02/23/19 0900  BP:  (!) 170/58  (!) 173/61  Pulse:  66  65  Resp:  20  (!) 26  Temp:   (!) 97.5 F (36.4 C)   TempSrc:   Oral   SpO2: 91% 91%  91%  Weight:      Height:        Intake/Output Summary (Last 24 hours) at 02/23/2019 1029 Last data filed at 02/23/2019 0810 Gross per 24 hour  Intake 240 ml  Output 5000 ml  Net -4760 ml   Last 3 Weights 02/23/2019 02/22/2019 02/07/2019  Weight (lbs) 377 lb 6.8 oz 373 lb 3.8 oz  388 lb  Weight (kg) 171.2 kg 169.3 kg 175.996 kg      Telemetry    SR - Personally Reviewed  ECG    No new tracing today - Personally Reviewed  Physical Exam  In bed GEN: No acute distress.  Morbidly obese Neck: unable to assess JVDs Cardiac: RRR, no murmurs, rubs, or gallops.  Respiratory:crackles to auscultation bilaterally. GI: distended  MS: B/L 2+ non-pitting edema. Neuro:  Nonfocal  Psych: Normal affect   Labs    High Sensitivity Troponin:   Recent Labs  Lab 02/22/19 0324 02/22/19 0524  TROPONINIHS 14 15.0      Cardiac EnzymesNo results for input(s): TROPONINI in the last 168 hours. No results for input(s): TROPIPOC in the last 168 hours.   Chemistry Recent Labs  Lab 02/22/19 0324 02/23/19 0222  NA 142 140  K 4.4 3.6  CL 99 97*  CO2 34* 32  GLUCOSE 243* 281*  BUN 30* 39*  CREATININE 2.41* 2.60*  CALCIUM 8.5* 8.8*  PROT 8.1 8.2*  ALBUMIN 3.4* 3.1*  AST 21 15  ALT 25 22  ALKPHOS 231* 201*  BILITOT 0.2* 0.8  GFRNONAA 29* 26*  GFRAA 33* 30*  ANIONGAP 9 11     Hematology Recent Labs  Lab 02/22/19 0324 02/22/19 0934 02/23/19 0222  WBC 11.6* 11.9* 14.7*  RBC 4.25 4.16* 4.30  HGB 12.5* 12.9* 12.7*  HCT 45.9 45.0 45.1  MCV 108.0* 108.2* 104.9*  MCH 29.4 31.0 29.5  MCHC 27.2* 28.7* 28.2*  RDW 12.7 12.8 12.6  PLT 223 194 272    BNP Recent Labs  Lab 02/22/19 0324  BNP 230.6*     DDimer No results for input(s): DDIMER in the last 168 hours.   Radiology    Dg Chest Port 1 View  Result Date: 02/22/2019 CLINICAL DATA:  58 y/o M; history of COPD and CHF. Shortness of breath. EXAM: PORTABLE CHEST 1 VIEW COMPARISON:  04/30/2018 chest radiograph. FINDINGS: Cardiomegaly, CABG, and median sternotomy postsurgical changes. Diffuse patchy opacifications of the lungs. Possible small right effusion. No pneumothorax. No acute osseous abnormality is evident. IMPRESSION: Diffuse patchy opacifications of the lungs probably represents pulmonary edema.  Possible small right effusion. Cardiomegaly. Electronically Signed   By: Kristine Garbe M.D.   On: 02/22/2019 04:09    Cardiac Studies     Patient Profile     58 y.o. male with a hx of  CAD s/p CABG, chronic diastolic CHFwho is being seen today for the evaluation of acute on chronic CHF.  Assessment & Plan    1. Acute on Chronic Diastolic CHF:TEE 2/69 showed EF 55-60% with mildly dilated/mildly dysfunctional RV. NYHAIII chronically.  -weight most probably inaccurate as I%O negative 4.7 L - Crea up (cardiorenal syndrome), I would decrease iv lasix to 60 mg BID  2. Acute hypercapnic respiratory failure - this is sec to morbid obesity with hypoventilation syndrome, refusal of sleep apnea evaluation. - he refuses to use BiPAP  -overall prognosis very poor unless he takes dramatic approach to his obesity and lifestyle, including sleep apnea evaluation.  3. PAF:  - S/P DC-CV 04/2018.in SR today. - Continue amiodarone to 100 mg daily. TSH normal on 02/06/19. - Continue eliquis 5 mg twice a day.   4. Acute on chronic kidney failure - crea up 2.4 -> 2.6 from baseline 1.6-1.9  5. CAD: S/p LIMA-LAD in 2009. -No s/s ischemia. - No ASA given Eliquis use.  - Continue rosuvastatin 20 mg  6. H/O metabolic encephalopathy: -Wearing O2 continuously. Not interested in sleep study.  7. HTN  -elevated, increase lasix for now.  8. Obesity Body mass index is 55.67 kg/m.  For questions or updates, please contact [place] Please consult [email or web address] for contact info under     Signed, Ena Dawley, MD  02/23/2019, 10:29 AM

## 2019-02-23 NOTE — Progress Notes (Addendum)
[facility]                                                                              Patient Demographics  [name], is a 58 y.o. male, DOB - 09-29-1960, YHC:[id number]  Admit date - 02/22/2019   Admitting Physician Rise Patience, MD  Outpatient Primary MD for the patient is Caren Macadam, MD  Outpatient specialists:   LOS - 1  days   Medical records reviewed and are as summarized below:    Chief Complaint  Patient presents with  . COPD  . Shortness of Breath       Brief summary   .  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  Patient was brought to ED via EMS after his wife called 911 for dyspnea.  Prior to this he had taken a pain pill.  In ED, ABG showed respiratory acidosis and patient was placed on 15 L nonrebreather.  He was admitted to stepdown unit for BiPAP, steroids and diuresis.  Cardiology was consulted for suspected CHF exacerbation.  He received a dose of Lasix, steroids, MDI inhaler.  Patient was admitted to stepdown unit. COVID-19 test negative   Assessment & Plan    Principal Problem:   Acute on chronic respiratory failure with hypoxia and hypercapnia (HCC) -Likely precipitated due to acute on chronic COPD exacerbation, acute on chronic diastolic CHF exacerbation, OHS, likely somnolence from ?  Pain medication -Currently alert and oriented x3, refused BiPAP earlier this morning -Currently O2 sats 91% on 3.5 L (at baseline on 3 L at home) -Will likely benefit from outpatient sleep study and possible CPAP, if he is compliant  Active Problems:   Acute exacerbation of chronic obstructive pulmonary disease (COPD) (HCC)   -Wheezing and shortness of breath improving, still not at baseline, had received breathing treatment prior to my encounter -O2 sats 91% on 3.5 L, Taper IV Solu-Medrol to 40 mg every 8 hours -Continue Pulmicort, Brovana, Xopenex, added flutter valve    Acute on chronic diastolic CHF (congestive heart failure) (HCC) -Shortness of breath is improving but still not at baseline, has not ambulated yet -Continue Lasix IV 60 mg every 6 hours.  Outpatient on torsemide 60 mg twice daily -Negative balance of 4.7 L, continue strict I's and O's and daily weights -Troponins x3 negative, cardiology following -2D echo showed EF of 55 to 60%, normal LV systolic function, mild LVH, mild LVE  Paroxysmal atrial fibrillation -Undergoing DCCV in 2019, remains in sinus rhythm -Continue Coreg, amiodarone, rate currently controlled -Continue Eliquis    CAD (coronary artery disease) of artery bypass graft, history of CABG with LIMA to LAD -Cardiology following, no acute ACS, troponins x3- -Continue Coreg, Imdur, amiodarone, Eliquis    Morbid obesity (HCC) -BMI 54.1, counseled patient on diet and weight control   Acute on CKD (chronic kidney  disease) stage 3, GFR 30-59 ml/min (HCC) -Baseline creatinine 1.6-1.9, worsened likely due to decreased renal perfusion from acute heart failure, respiratory failure -Now on IV diuresis, creatinine slightly trended up, cardiology following -Avoid nephrotoxic medications    DM (diabetes mellitus) type II uncontrolled, periph vascular disorder (HCC) -Uncontrolled with hyperglycemia likely due to IV steroids -Increase Lantus to 40 units nightly, added meal coverage 4 units 3 times daily AC, continue sliding scale insulin moderate -Taper IV Solu-Medrol to 40 mg IV every 8 hours, if improving, will transition to oral prednisone in a.m.  History of CVA status post acoustic neuroma surgery Continue Eliquis  History of brain tumor status post craniotomy -Continue with  Topamax  Chronic pain syndrome Patient had presented with acute hypercarbic respiratory failure, somnolence after taking pain pill at home, will decrease dose of Norco.  Recommended judicious use of narcotics and follow closely with PCP.  Code Status: Full CODE STATUS DVT Prophylaxis: Eliquis Family Communication: Discussed in detail with the patient, all imaging results, lab results explained to the patient's wife on the phone    Disposition Plan: High risk of decompensation, refusing BiPAP, remains in stepdown unit today  Time Spent in minutes 35 minutes  Procedures:  2D echo  Consultants:   Cardiology  Antimicrobials:   Anti-infectives (From admission, onward)   Start     Dose/Rate Route Frequency Ordered Stop   02/22/19 1330  doxycycline (VIBRA-TABS) tablet 100 mg     100 mg Oral Every 12 hours 02/22/19 1219 02/27/19 0959         Medications  Scheduled Meds: . amiodarone  100 mg Oral Daily  . apixaban  5 mg Oral BID  . arformoterol  15 mcg Nebulization BID  . budesonide (PULMICORT) nebulizer solution  0.25 mg Nebulization BID  . carvedilol  12.5 mg Oral BID WC  . chlorhexidine  15 mL Mouth Rinse BID  . Chlorhexidine Gluconate Cloth  6 each Topical Daily  . doxycycline  100 mg Oral Q12H  . furosemide  60 mg Intravenous Q6H  . guaiFENesin  1,200 mg Oral BID  . insulin aspart  0-15 Units Subcutaneous TID WC  . insulin aspart  0-5 Units Subcutaneous QHS  . insulin aspart  4 Units Subcutaneous TID WC  . insulin glargine  40 Units Subcutaneous QHS  . isosorbide mononitrate  30 mg Oral Daily  . levalbuterol  0.63 mg Nebulization BID  . mouth rinse  15 mL Mouth Rinse q12n4p  . methylPREDNISolone (SOLU-MEDROL) injection  40 mg Intravenous Q8H  . sodium chloride flush  3 mL Intravenous Q12H  . topiramate  100 mg Oral QHS   Continuous Infusions: . sodium chloride     PRN Meds:.sodium chloride, acetaminophen **OR** acetaminophen, artificial tears, ipratropium,  ondansetron **OR** ondansetron (ZOFRAN) IV, polyethylene glycol, polyvinyl alcohol, sodium chloride flush      Subjective:   Braxton Vantrease was seen and examined today.  Refused BiPAP early this morning, does not want BiPAP.  Shortness of breath is improving, currently not at baseline.  Has not ambulated yet.  No fevers or chills.  No chest pain. Patient denies dizziness,  abdominal pain, N/V/D/C, new weakness, numbess, tingling.  Objective:   Vitals:   02/23/19 0500 02/23/19 0715 02/23/19 0810 02/23/19 0846  BP:   (!) 170/58   Pulse:   66   Resp:   20   Temp:    (!) 97.5 F (36.4 C)  TempSrc:    Oral  SpO2:  91% 91%  Weight: (!) 171.2 kg     Height:        Intake/Output Summary (Last 24 hours) at 02/23/2019 0915 Last data filed at 02/23/2019 0810 Gross per 24 hour  Intake 240 ml  Output 5000 ml  Net -4760 ml     Wt Readings from Last 3 Encounters:  02/23/19 (!) 171.2 kg  02/07/19 (!) 176 kg  02/06/19 (!) 176 kg     Exam  General: Alert and oriented x 3, NAD  Eyes:  HEENT:  Atraumatic, normocephalic, difficult to assess JVD due to body habitus  Cardiovascular: S1 S2 auscultated, Regular rate and rhythm.  Respiratory: Diminished breath sounds bilaterally with scattered wheezing  Gastrointestinal: Morbidly obese soft, nontender, nondistended, + bowel sounds  Ext: 1+  pedal edema bilaterally  Neuro: No new deficits  Musculoskeletal: No digital cyanosis, clubbing  Skin: No rashes  Psych: Normal affect and demeanor, alert and oriented x3    Data Reviewed:  I have personally reviewed following labs and imaging studies  Micro Results Recent Results (from the past 240 hour(s))  SARS Coronavirus 2 (CEPHEID- Performed in [facility] hospital lab), Hosp Order     Status: None   Collection Time: 02/22/19  3:24 AM   Specimen: Nasopharyngeal Swab  Result Value Ref Range Status   SARS Coronavirus 2 NEGATIVE NEGATIVE Final    Comment: (NOTE) If result is  NEGATIVE SARS-CoV-2 target nucleic acids are NOT DETECTED. The SARS-CoV-2 RNA is generally detectable in upper and lower  respiratory specimens during the acute phase of infection. The lowest  concentration of SARS-CoV-2 viral copies this assay can detect is 250  copies / mL. A negative result does not preclude SARS-CoV-2 infection  and should not be used as the sole basis for treatment or other  patient management decisions.  A negative result may occur with  improper specimen collection / handling, submission of specimen other  than nasopharyngeal swab, presence of viral mutation(s) within the  areas targeted by this assay, and inadequate number of viral copies  (<250 copies / mL). A negative result must be combined with clinical  observations, patient history, and epidemiological information. If result is POSITIVE SARS-CoV-2 target nucleic acids are DETECTED. The SARS-CoV-2 RNA is generally detectable in upper and lower  respiratory specimens dur ing the acute phase of infection.  Positive  results are indicative of active infection with SARS-CoV-2.  Clinical  correlation with patient history and other diagnostic information is  necessary to determine patient infection status.  Positive results do  not rule out bacterial infection or co-infection with other viruses. If result is PRESUMPTIVE POSTIVE SARS-CoV-2 nucleic acids MAY BE PRESENT.   A presumptive positive result was obtained on the submitted specimen  and confirmed on repeat testing.  While 2019 novel coronavirus  (SARS-CoV-2) nucleic acids may be present in the submitted sample  additional confirmatory testing may be necessary for epidemiological  and / or clinical management purposes  to differentiate between  SARS-CoV-2 and other Sarbecovirus currently known to infect humans.  If clinically indicated additional testing with an alternate test  methodology [phone]) is advised. The SARS-CoV-2 RNA is generally  detectable  in upper and lower respiratory sp ecimens during the acute  phase of infection. The expected result is Negative. Fact Sheet for Patients:  StrictlyIdeas.no Fact Sheet for Healthcare Providers: BankingDealers.co.za This test is not yet approved or cleared by the [place] FDA and has been authorized for detection and/or diagnosis of SARS-CoV-2 by FDA under an  Emergency Use Authorization (EUA).  This EUA will remain in effect (meaning this test can be used) for the duration of the COVID-19 declaration under Section 564(b)(1) of the Act, 21 U.S.C. section 360bbb-3(b)(1), unless the authorization is terminated or revoked sooner. Performed at [facility], [place] [address] [place], [place] [address]   MRSA PCR Screening     Status: None   Collection Time: 02/22/19 10:03 AM   Specimen: Nasopharyngeal  Result Value Ref Range Status   MRSA by PCR NEGATIVE NEGATIVE Final    Comment:        The GeneXpert MRSA Assay (FDA approved for NASAL specimens only), is one component of a comprehensive MRSA colonization surveillance program. It is not intended to diagnose MRSA infection nor to guide or monitor treatment for MRSA infections. Performed at [facility], [place] [address], [name], [place] [address]     Radiology Reports Dg Chest Port 1 View  Result Date: 02/22/2019 CLINICAL DATA:  58 y/o M; history of COPD and CHF. Shortness of breath. EXAM: PORTABLE CHEST 1 VIEW COMPARISON:  04/30/2018 chest radiograph. FINDINGS: Cardiomegaly, CABG, and median sternotomy postsurgical changes. Diffuse patchy opacifications of the lungs. Possible small right effusion. No pneumothorax. No acute osseous abnormality is evident. IMPRESSION: Diffuse patchy opacifications of the lungs probably represents pulmonary edema. Possible small right effusion. Cardiomegaly. Electronically Signed   By: Kristine Garbe M.D.   On:  02/22/2019 04:09    Lab Data:  CBC: Recent Labs  Lab 02/22/19 0324 02/22/19 0934 02/23/19 0222  WBC 11.6* 11.9* 14.7*  NEUTROABS 8.7* 10.7*  --   HGB 12.5* 12.9* 12.7*  HCT 45.9 45.0 45.1  MCV 108.0* 108.2* 104.9*  PLT 223 194 829   Basic Metabolic Panel: Recent Labs  Lab 02/22/19 0324 02/23/19 0222  NA 142 140  K 4.4 3.6  CL 99 97*  CO2 34* 32  GLUCOSE 243* 281*  BUN 30* 39*  CREATININE 2.41* 2.60*  CALCIUM 8.5* 8.8*   GFR: Estimated Creatinine Clearance: 49.2 mL/min (A) (by C-G formula based on SCr of 2.6 mg/dL (H)). Liver Function Tests: Recent Labs  Lab 02/22/19 0324 02/23/19 0222  AST 21 15  ALT 25 22  ALKPHOS 231* 201*  BILITOT 0.2* 0.8  PROT 8.1 8.2*  ALBUMIN 3.4* 3.1*   No results for input(s): LIPASE, AMYLASE in the last 168 hours. No results for input(s): AMMONIA in the last 168 hours. Coagulation Profile: No results for input(s): INR, PROTIME in the last 168 hours. Cardiac Enzymes: No results for input(s): CKTOTAL, CKMB, CKMBINDEX, TROPONINI in the last 168 hours. BNP (last 3 results) No results for input(s): PROBNP in the last 8760 hours. HbA1C: No results for input(s): HGBA1C in the last 72 hours. CBG: Recent Labs  Lab 02/22/19 1219 02/22/19 1221 02/22/19 1559 02/22/19 2135 02/23/19 0815  GLUCAP 291* 322* 278* 270* 336*   Lipid Profile: No results for input(s): CHOL, HDL, LDLCALC, TRIG, CHOLHDL, LDLDIRECT in the last 72 hours. Thyroid Function Tests: Recent Labs    02/22/19 0934  TSH 0.372   Anemia Panel: No results for input(s): VITAMINB12, FOLATE, FERRITIN, TIBC, IRON, RETICCTPCT in the last 72 hours. Urine analysis:    Component Value Date/Time   COLORURINE YELLOW 02/22/2019 0822   APPEARANCEUR CLEAR 02/22/2019 0822   LABSPEC 1.011 02/22/2019 0822   PHURINE 7.0 02/22/2019 0822   GLUCOSEU 150 (A) 02/22/2019 0822   HGBUR NEGATIVE 02/22/2019 0822   BILIRUBINUR NEGATIVE 02/22/2019 0822   KETONESUR NEGATIVE  02/22/2019  [place] 02/22/2019 0822   UROBILINOGEN 0.2 11/19/2012 2217   NITRITE NEGATIVE 02/22/2019 0822   LEUKOCYTESUR TRACE (A) 02/22/2019 3474     Estill Cotta M.D. [facility] 02/23/2019, 9:15 AM  Pager: [phone] Between 7am to 7pm - call Pager - [phone]  After 7pm go to [email or web address] - password [email or web address]  Call night coverage person covering after 7pm

## 2019-02-23 NOTE — Progress Notes (Signed)
Initial Nutrition Assessment  RD working remotely.   DOCUMENTATION CODES:   Morbid obesity  INTERVENTION:  - will order Ensure Max once/day, each 11 oz supplement provides 150 kcal and 30 grams protein. - will order 30 ml prostat TID, each supplement provides 100 kcal and 15 grams protein. - will order daily multivitamin with minerals. - continue to encourage PO intakes. - will monitor for diet education needs prior to d/c.     NUTRITION DIAGNOSIS:   Increased nutrient needs related to acute illness as evidenced by estimated needs.  GOAL:   Patient will meet greater than or equal to 90% of their needs  MONITOR:   PO intake, Supplement acceptance, Labs, Weight trends, Skin, I & O's  REASON FOR ASSESSMENT:   Consult Assessment of nutrition requirement/status  ASSESSMENT:   58 year-old male with medical history of CAD s/p CABG with LIMA to LAD, fibromyalgia, gout, history of atrial fibrillation with RVR s/p prior cardioversion, CHF, COPD on 3L O2 at home via nasal cannula, CKD stage 3, type 2 DM, history of nephrolithiasis, and history of CVA. He presented to ED with shortness of breath and somnolence after his wife called 911 for dyspnea. Prior to this, he had taken a pain pill. In the ED, ABG showed respiratory acidosis and patient was placed on 15L nonrebreather.  He was admitted to SDU for BiPAP, steroids, and diuresis. Cardiology was consulted for suspected CHF exacerbation.  Current weight is consistent with weight from 1/24-4/17. It appears weight then trended +5 kg and is now back to previous weight. Notes indicate admitting dx included CHF exacerbation. Diet advanced from NPO to CLD yesterday at 2:30 PM and then to Heart Healthy/Carb Modified with 1.5L fluid restriction today at 8:50 AM. No intakes documented since admission. Patient would likely benefit from education prior to d/c.   Per notes:  - acute on chronic respiratory failure with hypoxia and hypercapnia likely  2/2 COPD exacerbation and acute on CHF exacerbation - acute on CKD stage 3 - if patient is improving, plan to transition from solu-medrol to prednisone 7/6 AM - hx of brain tumor s/p craniotomy in the past   Medications reviewed; 60 mg IV lasix BID, sliding scale novolog, 4 units novolog TID, 40 units lantus/day, 40 mg solu-medrol TID. Labs reviewed; CBGs: 336 and 311 mg/dl today, Cl: 97 mmol/, BUN: 39 mg/dl, creatinine: 2.6 mg/dl, GFR: 26 ml/min.     NUTRITION - FOCUSED PHYSICAL EXAM:  unable to complete at this time.   Diet Order:   Diet Order            Diet heart healthy/carb modified Room service appropriate? Yes; Fluid consistency: Thin; Fluid restriction: 1500 mL Fluid  Diet effective now              EDUCATION NEEDS:   Not appropriate for education at this time  Skin:  Skin Assessment: Skin Integrity Issues: Skin Integrity Issues:: DTI DTI: L buttocks  Last BM:  7/4  Height:   Ht Readings from Last 1 Encounters:  02/22/19 5\' 10"  (1.778 m)    Weight:   Wt Readings from Last 1 Encounters:  02/23/19 (!) 171.2 kg    Ideal Body Weight:  75.4 kg  BMI:  Body mass index is 54.16 kg/m.  Estimated Nutritional Needs:   Kcal:  2330-0762 kcal  Protein:  110-120 grams  Fluid:  >/= 1.5 L/day      Jarome Matin, MS, RD, LDN, [facility] Inpatient Clinical Dietitian Pager # [phone]  After hours/weekend pager # [phone]

## 2019-02-23 NOTE — Progress Notes (Signed)
Spoke to patient regarding use of Bipap with sleep and patient adamantly refuses.  RN aware.

## 2019-02-23 NOTE — Progress Notes (Signed)
This Probation officer had extensive conversation with patient regarding his refusal to use Bipap.  Attempts at education were unsuccessful and patient does not believe that he needs anything other than oxygen when he sleeps.  RN aware

## 2019-02-23 NOTE — Evaluation (Signed)
Physical Therapy Evaluation Patient Details Name: [name]. MRN: [id number] DOB: December 14, 1960 Today's Date: 02/23/2019   History of Present Illness  Pt is a 58 y.o. male who presented to the ED with shortness of breath and somnolence. He was admitted with acute on chronic respiratory failure with hypoxia and hypercapnia and CHF exacerbation. He has a PMH significant for brain tumor s/p craniotomy, CAD s/p CABG with LIMA to LAD, fibromyalgia, gout, afib, CHF, COPD on 3 L O2, CKD stage III, DM-2, CVA with residual right sided weakness.   Clinical Impression  Pt admitted as above and presenting with functional mobility limitations 2* generalized weakness, balance deficits, questionable safety awareness and ltd endurance.  Pt should progress to dc home with family assist.    Follow Up Recommendations Home health PT    Equipment Recommendations  None recommended by PT    Recommendations for Other Services       Precautions / Restrictions Precautions Precautions: Fall Restrictions Weight Bearing Restrictions: No      Mobility  Bed Mobility Overal bed mobility: Needs Assistance Bed Mobility: Supine to Sit     Supine to sit: Min guard     General bed mobility comments: Guarding assist for safety; heavy use of bed rail  Transfers Overall transfer level: Needs assistance Equipment used: Rolling walker (2 wheeled) Transfers: Sit to/from Stand Sit to Stand: Min guard;Min assist;+2 safety/equipment         General transfer comment: Initially min assist to power to standing but able to complete with hands on guarding on subsequent attempt.   Ambulation/Gait Ambulation/Gait assistance: Min assist;Min guard;+2 safety/equipment Gait Distance (Feet): 40 Feet(twice with seated rest in between) Assistive device: Rolling walker (2 wheeled) Gait Pattern/deviations: Step-through pattern;Shuffle;Trunk flexed;Wide base of support Gait velocity: decr   General Gait Details:  cues for posture, position from RW and basic safety awareness  Stairs            Wheelchair Mobility    Modified Rankin (Stroke Patients Only)       Balance Overall balance assessment: Needs assistance Sitting-balance support: No upper extremity supported;Feet supported Sitting balance-Leahy Scale: Fair     Standing balance support: Bilateral upper extremity supported;During functional activity Standing balance-Leahy Scale: Poor                               Pertinent Vitals/Pain Pain Assessment: No/denies pain    Home Living Family/patient expects to be discharged to:: Private residence Living Arrangements: Spouse/significant other Available Help at Discharge: Family;Available PRN/intermittently Type of Home: Mobile home Home Access: Ramped entrance     Home Layout: One level Home Equipment: [place] - 4 wheels;Bedside commode Additional Comments: sponge bathes at baseline, does not do shower transfs    Prior Function Level of Independence: Needs assistance   Gait / Transfers Assistance Needed: Rollator  ADL's / Homemaking Assistance Needed: Assist for bathing but independent for other ADL.   Comments: Per chart, does have an aide for 2 hours/day     Hand Dominance   Dominant Hand: Right    Extremity/Trunk Assessment   Upper Extremity Assessment Upper Extremity Assessment: Defer to OT evaluation RUE Deficits / Details: Mild deficits at baseline due to previous CVA    Lower Extremity Assessment Lower Extremity Assessment: Generalized weakness       Communication   Communication: Expressive difficulties  Cognition Arousal/Alertness: Awake/alert Behavior During Therapy: Agitated;Impulsive(focussed on having been given steroids) Overall  Cognitive Status: Within Functional Limits for tasks assessed                                 General Comments: Pt demonstrating approrpiate cognition for tasks assessed but very agitated  and poorly receptive to instruction.       General Comments General comments (skin integrity, edema, etc.): VSS throughout session. Pt very concerned that steroids have diminished his breathing    Exercises     Assessment/Plan    PT Assessment Patient needs continued PT services  PT Problem List Decreased strength;Decreased activity tolerance;Decreased balance;Decreased mobility;Decreased knowledge of use of DME;Obesity;Decreased cognition       PT Treatment Interventions DME instruction;Gait training;Functional mobility training;Therapeutic activities;Therapeutic exercise;Patient/family education    PT Goals (Current goals can be found in the Care Plan section)  Acute Rehab PT Goals Patient Stated Goal: HOME PT Goal Formulation: With patient Time For Goal Achievement: 03/09/19 Potential to Achieve Goals: Fair    Frequency Min 3X/week   Barriers to discharge        Co-evaluation PT/OT/SLP Co-Evaluation/Treatment: Yes Reason for Co-Treatment: For patient/therapist safety PT goals addressed during session: Mobility/safety with mobility OT goals addressed during session: ADL's and self-care       AM-PAC PT "6 Clicks" Mobility  Outcome Measure Help needed turning from your back to your side while in a flat bed without using bedrails?: None Help needed moving from lying on your back to sitting on the side of a flat bed without using bedrails?: A Little Help needed moving to and from a bed to a chair (including a wheelchair)?: A Little Help needed standing up from a chair using your arms (e.g., wheelchair or bedside chair)?: A Little Help needed to walk in hospital room?: A Little Help needed climbing 3-5 steps with a railing? : A Lot 6 Click Score: 18    End of Session Equipment Utilized During Treatment: Oxygen Activity Tolerance: Patient tolerated treatment well;Patient limited by fatigue Patient left: in chair;with call bell/phone within reach;with chair alarm  set;with nursing/sitter in room Nurse Communication: Mobility status PT Visit Diagnosis: Difficulty in walking, not elsewhere classified (R26.2);Muscle weakness (generalized) (M62.81)    Time: 1357-1430 PT Time Calculation (min) (ACUTE ONLY): 33 min   Charges:   PT Evaluation $PT Eval Low Complexity: 1 Low          [place] Pager [phone] Office [phone]   Yulitza Shorts 02/23/2019, 3:23 PM

## 2019-02-23 NOTE — Progress Notes (Signed)
Pt tolerated being on Bi-Pap for 2 hours, pulling off and requesting nasal cannula. Currently O2 Sats read 91% on 3L [place], no resp distress noted. Will continue to monitor.

## 2019-02-23 NOTE — Evaluation (Signed)
Occupational Therapy Evaluation Patient Details Name: [name]. MRN: [id number] DOB: April 02, 1961 Today's Date: 02/23/2019    History of Present Illness Pt is a 58 y.o. male who presented to the ED with shortness of breath and somnolence. He was admitted with acute on chronic respiratory failure with hypoxia and hypercapnia and CHF exacerbation. He has a PMH significant for brain tumor s/p craniotomy, CAD s/p CABG with LIMA to LAD, fibromyalgia, gout, afib, CHF, COPD on 3 L O2, CKD stage III, DM-2, CVA with residual right sided weakness.    Clinical Impression   PTA, pt reports independence with rollator for all mobility and ADL other than bathing tasks. He is currently limited in activity tolerance and demonstrates generalized weakness and shortness of breath with minimal activity impacting his occupational participation. Pt would benefit from continued OT services while admitted to improve independence and safety with ADL and functional mobility. Recommend HHOT follow-up post-acute D/C. OT will continue to follow while admitted.     Follow Up Recommendations  Home health OT;Supervision/Assistance - 24 hour    Equipment Recommendations  None recommended by OT    Recommendations for Other Services       Precautions / Restrictions Precautions Precautions: Fall Restrictions Weight Bearing Restrictions: No      Mobility Bed Mobility Overal bed mobility: Needs Assistance Bed Mobility: Supine to Sit     Supine to sit: Min guard     General bed mobility comments: Guarding assist for safety; heavy use of bed rail  Transfers Overall transfer level: Needs assistance Equipment used: Rolling walker (2 wheeled) Transfers: Sit to/from Stand Sit to Stand: Min guard;Min assist;+2 safety/equipment         General transfer comment: Initially min assist to power to standing but able to complete with hands on guarding on subsequent attempt.     Balance Overall balance  assessment: Needs assistance Sitting-balance support: No upper extremity supported;Feet supported Sitting balance-Leahy Scale: Fair     Standing balance support: Bilateral upper extremity supported;During functional activity Standing balance-Leahy Scale: Poor                             ADL either performed or assessed with clinical judgement   ADL Overall ADL's : Needs assistance/impaired Eating/Feeding: Set up;Sitting   Grooming: Supervision/safety;Sitting   Upper Body Bathing: Minimal assistance;Sitting   Lower Body Bathing: Maximal assistance;Sit to/from stand   Upper Body Dressing : Minimal assistance;Sitting   Lower Body Dressing: Maximal assistance;Sit to/from stand   Toilet Transfer: Minimal assistance;+2 for physical assistance;+2 for safety/equipment;Ambulation;RW Toilet Transfer Details (indicate cue type and reason): +2 assist for safety Toileting- Clothing Manipulation and Hygiene: Total assistance;Sit to/from stand       Functional mobility during ADLs: Minimal assistance;Min guard;+2 for physical assistance;+2 for safety/equipment;Rolling walker General ADL Comments: Pt able to ambulate in hallway     Vision Baseline Vision/History: Wears glasses Wears Glasses: Reading only Patient Visual Report: No change from baseline Vision Assessment?: No apparent visual deficits     Perception     Praxis      Pertinent Vitals/Pain Pain Assessment: No/denies pain     Hand Dominance Right   Extremity/Trunk Assessment Upper Extremity Assessment Upper Extremity Assessment: RUE deficits/detail;Generalized weakness RUE Deficits / Details: Mild deficits at baseline due to previous CVA   Lower Extremity Assessment Lower Extremity Assessment: Generalized weakness       Communication Communication Communication: Expressive difficulties   Cognition Arousal/Alertness:  Awake/alert Behavior During Therapy: Agitated;Impulsive(focussed on having been  given steroids) Overall Cognitive Status: Within Functional Limits for tasks assessed                                 General Comments: Pt demonstrating approrpiate cognition for tasks assessed but very agitated and poorly receptive to instruction.    General Comments  VSS throughout session. Pt very concerned that steroids have diminished his breathing.     Exercises     Shoulder Instructions      Home Living Family/patient expects to be discharged to:: Private residence Living Arrangements: Spouse/significant other Available Help at Discharge: Family;Available PRN/intermittently Type of Home: Mobile home Home Access: Ramped entrance     Home Layout: One level     Bathroom Shower/Tub: Teacher, early years/pre: Standard Bathroom Accessibility: Yes   Home Equipment: Walker - 4 wheels;Bedside commode   Additional Comments: sponge bathes at baseline, does not do shower transfs      Prior Functioning/Environment Level of Independence: Needs assistance  Gait / Transfers Assistance Needed: Rollator ADL's / Homemaking Assistance Needed: Assist for bathing but independent for other ADL.    Comments: Per chart, does have an aide for 2 hours/day        OT Problem List: Decreased strength;Decreased activity tolerance;Impaired balance (sitting and/or standing);Decreased knowledge of use of DME or AE;Decreased safety awareness;Decreased knowledge of precautions;Pain      OT Treatment/Interventions: Self-care/ADL training;Therapeutic exercise;Energy conservation;DME and/or AE instruction;Therapeutic activities;Patient/family education;Balance training    OT Goals(Current goals can be found in the care plan section) Acute Rehab OT Goals Patient Stated Goal: HOME OT Goal Formulation: With patient Time For Goal Achievement: 03/09/19 Potential to Achieve Goals: Good ADL Goals Pt Will Perform Grooming: with supervision;standing Pt Will Perform Upper Body  Dressing: with modified independence;sitting Pt Will Perform Lower Body Dressing: with min assist;sit to/from stand Pt Will Transfer to Toilet: with supervision;ambulating;regular height toilet Pt Will Perform Toileting - Clothing Manipulation and hygiene: with supervision;sit to/from stand  OT Frequency: Min 2X/week   Barriers to D/C:            Co-evaluation   Reason for Co-Treatment: For patient/therapist safety PT goals addressed during session: Mobility/safety with mobility OT goals addressed during session: ADL's and self-care      AM-PAC OT "6 Clicks" Daily Activity     Outcome Measure Help from another person eating meals?: None Help from another person taking care of personal grooming?: A Little Help from another person toileting, which includes using toliet, bedpan, or urinal?: A Lot Help from another person bathing (including washing, rinsing, drying)?: A Lot Help from another person to put on and taking off regular upper body clothing?: A Little Help from another person to put on and taking off regular lower body clothing?: A Lot 6 Click Score: 16   End of Session Equipment Utilized During Treatment: Rolling walker;Oxygen Nurse Communication: Mobility status  Activity Tolerance: Patient tolerated treatment well Patient left: in chair;with call bell/phone within reach;with nursing/sitter in room  OT Visit Diagnosis: Other abnormalities of gait and mobility (R26.89);Muscle weakness (generalized) (M62.81);Unsteadiness on feet (R26.81)                Time: 7026-3785 OT Time Calculation (min): 28 min Charges:  OT General Charges $OT Visit: 1 Visit OT Evaluation $OT Eval Moderate Complexity: [place]  Angline Schweigert A Moishy Laday 02/23/2019, 4:12 PM

## 2019-02-24 ENCOUNTER — Other Ambulatory Visit: Payer: Self-pay

## 2019-02-24 LAB — BASIC METABOLIC PANEL
Anion gap: 13 (ref 5–15)
BUN: 67 mg/dL — ABNORMAL HIGH (ref 6–20)
CO2: 33 mmol/L — ABNORMAL HIGH (ref 22–32)
Calcium: 8.4 mg/dL — ABNORMAL LOW (ref 8.9–10.3)
Chloride: 93 mmol/L — ABNORMAL LOW (ref 98–111)
Creatinine, Ser: 2.86 mg/dL — ABNORMAL HIGH (ref 0.61–1.24)
GFR calc Af Amer: 27 mL/min — ABNORMAL LOW (ref >60)
GFR calc non Af Amer: 23 mL/min — ABNORMAL LOW (ref >60)
Glucose, Bld: 324 mg/dL — ABNORMAL HIGH (ref 70–99)
Potassium: 4 mmol/L (ref 3.5–5.1)
Sodium: 139 mmol/L (ref 135–145)

## 2019-02-24 LAB — GLUCOSE, CAPILLARY
Glucose-Capillary: 291 mg/dL — ABNORMAL HIGH (ref 70–99)
Glucose-Capillary: 298 mg/dL — ABNORMAL HIGH (ref 70–99)
Glucose-Capillary: 232 mg/dL — ABNORMAL HIGH (ref 70–99)
Glucose-Capillary: 171 mg/dL — ABNORMAL HIGH (ref 70–99)

## 2019-02-24 LAB — URINE CULTURE: Culture: 50000 — AB

## 2019-02-24 MED ORDER — AMOXICILLIN-POT CLAVULANATE 875-125 MG PO TABS
1.0000 | ORAL_TABLET | Freq: Two times a day (BID) | ORAL | Status: DC
  Administered 2019-02-24 – 2019-02-27 (×6): 1 via ORAL
  Filled 2019-02-24 (×6): qty 1

## 2019-02-24 MED ORDER — PREDNISONE 20 MG PO TABS
40.0000 mg | ORAL_TABLET | Freq: Every day | ORAL | Status: DC
  Filled 2019-02-24: qty 2

## 2019-02-24 NOTE — TOC Initial Note (Addendum)
Transition of Care (TOC) - Initial/Assessment Note    Patient Details  Name: [name]. MRN: [id number] Date of Birth: 12/08/1960  Transition of Care [facility]) CM/SW Contact:    Nila Nephew, LCSW Phone Number: [phone] 02/24/2019, 1:06 PM  Clinical Narrative:  Pt admitted with acute on chronic respiratory failure due to COPD exacerbation. Pt lives at home with his wife and uses 3L o2 at baseline. Discussed recommendation for HHPT/OT with pt's wife, she reports that she and pt are agreeable ("will be glad not to have to go to a nursing home") and reports [facility] used [facility] in the past and would like this agency again if available. CSW contacted liaison Ben with referral.  Will need [place] orders.      Update: [facility] unable to provide [facility], discussed with Wife again and decided on [facility]. Spoke with liaison Dorian Pod and will update on DC date.               Expected Discharge Plan: [place] Barriers to Discharge: Continued Medical Work up   Patient Goals and CMS Choice Patient states their goals for this hospitalization and ongoing recovery are:: be able to go home CMS Medicare.gov Compare Post Acute Care list provided to:: Patient Represenative (must comment)(wife) Choice offered to / list presented to : Spouse  Expected Discharge Plan and Services Expected Discharge Plan: [place] In-house Referral: Clinical Social Work Discharge Planning Services: CM Consult Post Acute Care Choice: [place] arrangements for the past 2 months: Single Family Home                                      Prior Living Arrangements/Services Living arrangements for the past 2 months: Single Family Home Lives with:: Spouse Patient language and need for interpreter reviewed:: No Do you feel safe going back to the place where you live?: Yes      Need for Family Participation in Patient Care: Yes (Comment)(wife) Care giver  support system in place?: Yes (comment)(wife) Current home services: DME Criminal Activity/Legal Involvement Pertinent to Current Situation/Hospitalization: No - Comment as needed  Activities of Daily Living Home Assistive Devices/Equipment: Walker (specify type) ADL Screening (condition at time of admission) Patient's cognitive ability adequate to safely complete daily activities?: Yes Is the patient deaf or have difficulty hearing?: No Does the patient have difficulty seeing, even when wearing glasses/contacts?: No Does the patient have difficulty concentrating, remembering, or making decisions?: No Patient able to express need for assistance with ADLs?: Yes Does the patient have difficulty dressing or bathing?: No Independently performs ADLs?: Yes (appropriate for developmental age) Does the patient have difficulty walking or climbing stairs?: Yes Weakness of Legs: Both Weakness of Arms/Hands: None  Permission Sought/Granted Permission sought to share information with : Family Supports Permission granted to share information with : Yes, Verbal Permission Granted  Share Information with NAME: wife revonda           Emotional Assessment         Alcohol / Substance Use: Not Applicable Psych Involvement: No (comment)  Admission diagnosis:  Somnolence [R40.0] Carbon dioxide retention [E87.2] COPD exacerbation ([place]) [J44.1] Patient Active Problem List   Diagnosis Date Noted  . Acute on chronic diastolic CHF (congestive heart failure) ([place]) 02/23/2019  . Acute renal failure with acute renal cortical necrosis superimposed on stage 4 chronic kidney  disease ([place])   . Acute respiratory failure with hypoxia and hypercapnia ([place]) 02/22/2019  . SOB (shortness of breath) 02/22/2019  . Acute exacerbation of chronic obstructive pulmonary disease (COPD) ([place]) 02/22/2019  . CKD (chronic kidney disease) stage 3, GFR 30-59 ml/min (HCC) 09/14/2018  . DM (diabetes mellitus) type II  uncontrolled, periph vascular disorder ([place]) 09/14/2018  . Chronic diastolic heart failure ([place]) 05/20/2018  . Acute diastolic heart failure ([place])   . Atrial fibrillation with RVR ([place])   . Carbon dioxide retention   . Pressure injury of skin 01/29/2017  . Hereditary and idiopathic peripheral neuropathy 01/08/2017  . Acute on chronic respiratory failure with hypoxia and hypercapnia ([place]) 02/16/2016  . Irritation of right eye 10/18/2015  . 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 Headache due to intracranial disease 10/13/2013  . Edema 12/17/2012  . 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 Status post craniotomy 10/12/2012  . CAD (coronary artery disease) of artery bypass graft 09/09/2012  . 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 Morbid obesity ([place]) 09/09/2012   PCP:  Caren Macadam, MD Pharmacy:   [id number] [place], [place] [address] [place] [address] Phone: [phone] Fax: [phone]     Social Determinants of Health (SDOH) Interventions    Readmission Risk Interventions Readmission Risk Prevention Plan 02/24/2019  Transportation Screening Complete  PCP or Specialist Appt within 3-5 Days Not Complete  Not Complete comments see above  [place] or [place] Complete  Social Work Consult for [place] Planning/Counseling Complete  Palliative Care Screening Not Applicable  Medication Review Press photographer) Complete  Some recent data might be hidden

## 2019-02-24 NOTE — Plan of Care (Signed)
progressing 

## 2019-02-24 NOTE — Patient Outreach (Signed)
  [facility]) Care Management Chronic Special Needs Program    02/24/2019  Name: Frank Almeda., DOB: 1960/12/29  MRN: [id number]   [name] is enrolled in a chronic special needs plan for Diabetes. Client admitted to [facility] with dx of COPD exacerbation, Carbon dioxide retention, somnolence  and SOB on 02/22/19  Individualized care plan sent to [facility] for admission  [facility] Thea Silversmith will follow post discharge [place], Jackquline [place], [place] Management [phone]

## 2019-02-24 NOTE — Progress Notes (Signed)
SLP Cancellation Note  Patient Details Name: [name]. MRN: [id number] DOB: [birth year]   Cancelled treatment:       Reason Eval/Treat Not Completed: Patient's level of consciousness   Dannial Monarch 02/24/2019, 4:35 PM   Sonia Baller, MA, CCC-SLP Speech Therapy [facility] Acute Rehab Pager: [phone]

## 2019-02-24 NOTE — Progress Notes (Signed)
Progress Note  Patient Name: [name]. Date of Encounter: 02/24/2019  Primary Cardiologist:   No primary care provider on file.   Subjective   He thinks that his breathing is improved and back near baseline.  He denies chest pain.   Inpatient Medications    Scheduled Meds: . amiodarone  100 mg Oral Daily  . apixaban  5 mg Oral BID  . arformoterol  15 mcg Nebulization BID  . budesonide (PULMICORT) nebulizer solution  0.25 mg Nebulization BID  . carvedilol  12.5 mg Oral BID WC  . chlorhexidine  15 mL Mouth Rinse BID  . Chlorhexidine Gluconate Cloth  6 each Topical Daily  . doxycycline  100 mg Oral Q12H  . feeding supplement (PRO-STAT SUGAR FREE 64)  30 mL Oral TID BM  . furosemide  60 mg Intravenous Q12H  . guaiFENesin  1,200 mg Oral BID  . insulin aspart  0-15 Units Subcutaneous TID WC  . insulin aspart  0-5 Units Subcutaneous QHS  . insulin aspart  4 Units Subcutaneous TID WC  . insulin glargine  40 Units Subcutaneous QHS  . isosorbide mononitrate  30 mg Oral Daily  . levalbuterol  0.63 mg Nebulization BID  . mouth rinse  15 mL Mouth Rinse q12n4p  . methylPREDNISolone (SOLU-MEDROL) injection  40 mg Intravenous Q8H  . multivitamin with minerals  1 tablet Oral Daily  . Ensure Max Protein  11 oz Oral Daily  . sodium chloride flush  3 mL Intravenous Q12H  . topiramate  100 mg Oral QHS   Continuous Infusions: . sodium chloride     PRN Meds: sodium chloride, acetaminophen **OR** acetaminophen, artificial tears, hydrALAZINE, HYDROcodone-acetaminophen, ipratropium, ondansetron **OR** ondansetron (ZOFRAN) IV, polyethylene glycol, polyvinyl alcohol, sodium chloride flush   Vital Signs    Vitals:   02/24/19 0807 02/24/19 0813 02/24/19 0900 02/24/19 1000  BP:   (!) 165/63 135/69  Pulse:    63  Resp:   20 (!) 26  Temp:      TempSrc:      SpO2: 97% 97%  95%  Weight:      Height:        Intake/Output Summary (Last 24 hours) at 02/24/2019 1125 Last data  filed at 02/24/2019 0600 Gross per 24 hour  Intake 1010 ml  Output 2400 ml  Net -1390 ml   Filed Weights   02/22/19 0900 02/23/19 0500 02/24/19 0500  Weight: (!) 169.3 kg (!) 171.2 kg (!) 172 kg    Telemetry    NSR - Personally Reviewed  ECG    NA - Personally Reviewed  Physical Exam   GEN: No acute distress.   Neck: No  JVD Cardiac: RRR, no murmurs, rubs, or gallops.  Respiratory: Clear  to auscultation bilaterally. GI: Soft, nontender, non-distended  MS:   Moderate diffuse leg edema; No deformity. Neuro:  Nonfocal  Psych: Normal affect   Labs    Chemistry Recent Labs  Lab 02/22/19 0324 02/23/19 0222  NA 142 140  K 4.4 3.6  CL 99 97*  CO2 34* 32  GLUCOSE 243* 281*  BUN 30* 39*  CREATININE 2.41* 2.60*  CALCIUM 8.5* 8.8*  PROT 8.1 8.2*  ALBUMIN 3.4* 3.1*  AST 21 15  ALT 25 22  ALKPHOS 231* 201*  BILITOT 0.2* 0.8  GFRNONAA 29* 26*  GFRAA 33* 30*  ANIONGAP 9 11     Hematology Recent Labs  Lab 02/22/19 0324 02/22/19 0934 02/23/19 0222  WBC 11.6* 11.9*  14.7*  RBC 4.25 4.16* 4.30  HGB 12.5* 12.9* 12.7*  HCT 45.9 45.0 45.1  MCV 108.0* 108.2* 104.9*  MCH 29.4 31.0 29.5  MCHC 27.2* 28.7* 28.2*  RDW 12.7 12.8 12.6  PLT 223 194 272    Cardiac EnzymesNo results for input(s): TROPONINI in the last 168 hours. No results for input(s): TROPIPOC in the last 168 hours.   BNP Recent Labs  Lab 02/22/19 0324  BNP 230.6*     DDimer No results for input(s): DDIMER in the last 168 hours.   Radiology    X-ray Chest Pa And Lateral  Result Date: 02/23/2019 CLINICAL DATA:  Decreased O2 saturations. EXAM: CHEST - 2 VIEW COMPARISON:  Multiple previous chest x-rays. The most recent is 02/22/2019 FINDINGS: The heart is enlarged but stable. Persistent central vascular congestion and interstitial and airspace process, likely pulmonary edema. Slight improved aeration since yesterday's film. No definite pleural effusions or focal infiltrates. IMPRESSION: Persistent  but improving CHF. Electronically Signed   By: Marijo Sanes M.D.   On: 02/23/2019 10:33    Cardiac Studies   ECHO:   1. The left ventricle has normal systolic function, with an ejection fraction of 55-60%. The cavity size was mildly dilated. There is mildly increased left ventricular wall thickness.  2. Left atrial size was mildly dilated.  3. Extremely limited; definity used; normal LV systolic function; focal wall motion abnormality cannot be excluded; mild LVH; mild LVE.  Patient Profile     58 y.o. male with a hx of CAD s/p CABG, chronic diastolic CHF who is being seen for the evaluation of acute on chronic CHF.  Assessment & Plan    ACUTE ON CHRONIC DIASTOLIC HF:    Negative 6.1 liters since admit.  Continue IV diuresis.  Needs to keep legs elevated and consider compression wraps.    ACUTE RESPIRATORY FAILURE:  Hypoventilation obesity syndrome.  This will compound diuresis without progressive renal insufficiency.  Follow creat closely.    PAF:  Maintaining NSR on amiodarone.  Continue DOAC.    AKI:    Creat up slightly.  Follow.  If creat continues to rise we will need to stop the IV diuresis.   HTN:  This is being managed in the context of treating his CHF   For questions or updates, please contact [facility] Please consult [email or web address] for contact info under Cardiology/STEMI.   Signed, Minus Breeding, MD  02/24/2019, 11:25 AM

## 2019-02-24 NOTE — Progress Notes (Signed)
Pt refusing BIPAP QHS at this time, pt states that all it does is keep him awake.  RT to monitor and assess as needed.

## 2019-02-24 NOTE — Progress Notes (Signed)
[facility]                                                                              Patient Demographics  [name], is a 58 y.o. male, DOB - 1961/02/23, ELF:[id number]  Admit date - 02/22/2019   Admitting Physician Rise Patience, MD  Outpatient Primary MD for the patient is Caren Macadam, MD  Outpatient specialists:   LOS - 2  days   Medical records reviewed and are as summarized below:    Chief Complaint  Patient presents with  . COPD  . Shortness of Breath       Brief summary   .  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  Patient was brought to ED via EMS after his wife called 911 for dyspnea.  Prior to this he had taken a pain pill.  In ED, ABG showed respiratory acidosis and patient was placed on 15 L nonrebreather.  He was admitted to stepdown unit for BiPAP, steroids and diuresis.  Cardiology was consulted for suspected CHF exacerbation.  He received a dose of Lasix, steroids, MDI inhaler.  Patient was admitted to stepdown unit. COVID-19 test negative   Assessment & Plan    Principal Problem:   Acute on chronic respiratory failure with hypoxia and hypercapnia (HCC) -Likely precipitated due to acute on chronic COPD exacerbation, acute on chronic diastolic CHF exacerbation, OHS, likely somnolence from ?  Pain medication -Alert and oriented x3, refuses BiPAP overnight is 97% on 4 L (at baseline on 3 L at home) -Will likely benefit from outpatient sleep study and possible CPAP, if he is compliant  Active Problems:   Acute exacerbation of chronic obstructive pulmonary disease (COPD) (HCC)  -Wheezing improving, DC IV steroids,  transition to oral prednisone 40 mg daily  -Continue Pulmicort, Brovana, Xopenex, added flutter valve -Continue O2, wean as tolerated to baseline 3 L at home    Acute on chronic diastolic CHF (congestive heart failure) (HCC) -Shortness of breath is improving but still not at baseline, has not ambulated yet - Outpatient on torsemide 60 mg twice daily, troponins x3 neg -Negative balance of 6.1 L, Lasix decreased to 60 mg IV every 12 hours, follow cardiology recommendations -2D echo showed EF of 55 to 60%, normal LV systolic function, mild LVH, mild LVE -follow Bmet  Paroxysmal atrial fibrillation - DCCV in 2019, remains in sinus rhythm -Continue Coreg, amiodarone, rate currently controlled -Continue Eliquis    CAD (coronary artery disease) of artery bypass graft, history of CABG with LIMA to LAD -Cardiology following, no acute ACS, troponins x3- -Continue Coreg, Imdur, amiodarone, Eliquis    Morbid obesity ([place]) -BMI 54.1, counseled patient on diet and weight control   Acute on CKD (chronic kidney disease) stage 3, GFR 30-59 ml/min (HCC) -Baseline creatinine 1.6-1.9, worsened likely due to decreased renal perfusion from  acute heart failure, respiratory failure -Continue IV Lasix, was decreased yesterday, follow renal function    DM (diabetes mellitus) type II uncontrolled, periph vascular disorder (HCC) -Uncontrolled with hyperglycemia likely due to IV steroids -Continue Lantus 40 units nightly, NovoLog TID AC 4 units, continue SSI moderate -CBGs uncontrolled due to IV steroids, discontinued IV Solu-Medrol   History of CVA status post acoustic neuroma surgery Continue Eliquis  History of brain tumor status post craniotomy -Continue with Topamax  Chronic pain syndrome Patient had presented with acute hypercarbic respiratory failure, somnolence after taking pain pill at home, will decrease dose of Norco.  Recommended judicious use of narcotics and follow closely with PCP.  Code  Status: Full CODE STATUS DVT Prophylaxis: Eliquis Family Communication: Discussed in detail with the patient, all imaging results, lab results explained to the patient's wife on the phone yesterday   Disposition Plan: Transfer to telemetry floor, start PT OT,  Time Spent in minutes 25 minutes  Procedures:  2D echo  Consultants:   Cardiology  Antimicrobials:   Anti-infectives (From admission, onward)   Start     Dose/Rate Route Frequency Ordered Stop   02/22/19 1330  doxycycline (VIBRA-TABS) tablet 100 mg     100 mg Oral Every 12 hours 02/22/19 1219 02/27/19 0959         Medications  Scheduled Meds: . amiodarone  100 mg Oral Daily  . apixaban  5 mg Oral BID  . arformoterol  15 mcg Nebulization BID  . budesonide (PULMICORT) nebulizer solution  0.25 mg Nebulization BID  . carvedilol  12.5 mg Oral BID WC  . chlorhexidine  15 mL Mouth Rinse BID  . Chlorhexidine Gluconate Cloth  6 each Topical Daily  . doxycycline  100 mg Oral Q12H  . feeding supplement (PRO-STAT SUGAR FREE 64)  30 mL Oral TID BM  . furosemide  60 mg Intravenous Q12H  . guaiFENesin  1,200 mg Oral BID  . insulin aspart  0-15 Units Subcutaneous TID WC  . insulin aspart  0-5 Units Subcutaneous QHS  . insulin aspart  4 Units Subcutaneous TID WC  . insulin glargine  40 Units Subcutaneous QHS  . isosorbide mononitrate  30 mg Oral Daily  . levalbuterol  0.63 mg Nebulization BID  . mouth rinse  15 mL Mouth Rinse q12n4p  . multivitamin with minerals  1 tablet Oral Daily  . [START ON 02/25/2019] predniSONE  40 mg Oral QAC breakfast  . Ensure Max Protein  11 oz Oral Daily  . sodium chloride flush  3 mL Intravenous Q12H  . topiramate  100 mg Oral QHS   Continuous Infusions: . sodium chloride     PRN Meds:.sodium chloride, acetaminophen **OR** acetaminophen, artificial tears, hydrALAZINE, HYDROcodone-acetaminophen, ipratropium, ondansetron **OR** ondansetron (ZOFRAN) IV, polyethylene glycol, polyvinyl alcohol,  sodium chloride flush      Subjective:   [name] was seen and examined today.  Refuses BiPAP overnight.  O2 sats 95% on 4 L.  Shortness of breath is improving, still not at baseline, no chest pain.  Patient denies dizziness,  abdominal pain, N/V/D/C, new weakness, numbess, tingling.  Objective:   Vitals:   02/24/19 0807 02/24/19 0813 02/24/19 0900 02/24/19 1000  BP:   (!) 165/63 135/69  Pulse:    63  Resp:   20 (!) 26  Temp:      TempSrc:      SpO2: 97% 97%  95%  Weight:      Height:  Intake/Output Summary (Last 24 hours) at 02/24/2019 1155 Last data filed at 02/24/2019 0600 Gross per 24 hour  Intake 1010 ml  Output 2400 ml  Net -1390 ml     Wt Readings from Last 3 Encounters:  02/24/19 (!) 172 kg  02/07/19 (!) 176 kg  02/06/19 (!) 176 kg   Physical Exam  General: Alert and oriented x 3, NAD  Eyes:   HEENT:  Atraumatic, normocephalic, difficult to assess JVD due to body habitus  Cardiovascular: S1 S2 clear, no murmurs, RRR.  1+ pitting edema  Respiratory: Diminished breath sounds bilaterally, no wheezing  Gastrointestinal: Soft, nontender, nondistended, NBS  Ext: 1+pedal edema bilaterally  Neuro: no new deficits  Musculoskeletal: No cyanosis, clubbing  Skin: No rashes  Psych: Normal affect and demeanor, alert and oriented x3    Data Reviewed:  I have personally reviewed following labs and imaging studies  Micro Results Recent Results (from the past 240 hour(s))  SARS Coronavirus 2 (CEPHEID- Performed in [place] hospital lab), Hosp Order     Status: None   Collection Time: 02/22/19  3:24 AM   Specimen: Nasopharyngeal Swab  Result Value Ref Range Status   SARS Coronavirus 2 NEGATIVE NEGATIVE Final    Comment: (NOTE) If result is NEGATIVE SARS-CoV-2 target nucleic acids are NOT DETECTED. The SARS-CoV-2 RNA is generally detectable in upper and lower  respiratory specimens during the acute phase of infection. The lowest   concentration of SARS-CoV-2 viral copies this assay can detect is 250  copies / mL. A negative result does not preclude SARS-CoV-2 infection  and should not be used as the sole basis for treatment or other  patient management decisions.  A negative result may occur with  improper specimen collection / handling, submission of specimen other  than nasopharyngeal swab, presence of viral mutation(s) within the  areas targeted by this assay, and inadequate number of viral copies  (<250 copies / mL). A negative result must be combined with clinical  observations, patient history, and epidemiological information. If result is POSITIVE SARS-CoV-2 target nucleic acids are DETECTED. The SARS-CoV-2 RNA is generally detectable in upper and lower  respiratory specimens dur ing the acute phase of infection.  Positive  results are indicative of active infection with SARS-CoV-2.  Clinical  correlation with patient history and other diagnostic information is  necessary to determine patient infection status.  Positive results do  not rule out bacterial infection or co-infection with other viruses. If result is PRESUMPTIVE POSTIVE SARS-CoV-2 nucleic acids MAY BE PRESENT.   A presumptive positive result was obtained on the submitted specimen  and confirmed on repeat testing.  While 2019 novel coronavirus  (SARS-CoV-2) nucleic acids may be present in the submitted sample  additional confirmatory testing may be necessary for epidemiological  and / or clinical management purposes  to differentiate between  SARS-CoV-2 and other Sarbecovirus currently known to infect humans.  If clinically indicated additional testing with an alternate test  methodology [phone]) is advised. The SARS-CoV-2 RNA is generally  detectable in upper and lower respiratory sp ecimens during the acute  phase of infection. The expected result is Negative. Fact Sheet for Patients:  StrictlyIdeas.no Fact Sheet  for Healthcare Providers: BankingDealers.co.za This test is not yet approved or cleared by the [place] FDA and has been authorized for detection and/or diagnosis of SARS-CoV-2 by FDA under an Emergency Use Authorization (EUA).  This EUA will remain in effect (meaning this test can be used) for the duration of  the COVID-19 declaration under Section 564(b)(1) of the Act, 21 U.S.C. section 360bbb-3(b)(1), unless the authorization is terminated or revoked sooner. Performed at [facility], [place] [address], [place], [place] [address]   Urine culture     Status: Abnormal (Preliminary result)   Collection Time: 02/22/19  8:15 AM   Specimen: Urine, Clean Catch  Result Value Ref Range Status   Specimen Description   Final    URINE, CLEAN CATCH Performed at [facility], [place] [address], [place], [place] [address]    Special Requests   Final    NONE Performed at [facility], [place] [address] [place], [place] [address]    Culture (A)  Final    50,000 COLONIES/mL PROTEUS MIRABILIS SUSCEPTIBILITIES TO FOLLOW Performed at [facility], [place] [address], [place], [place] [address]    Report Status PENDING  Incomplete  MRSA PCR Screening     Status: None   Collection Time: 02/22/19 10:03 AM   Specimen: Nasopharyngeal  Result Value Ref Range Status   MRSA by PCR NEGATIVE NEGATIVE Final    Comment:        The GeneXpert MRSA Assay (FDA approved for NASAL specimens only), is one component of a comprehensive MRSA colonization surveillance program. It is not intended to diagnose MRSA infection nor to guide or monitor treatment for MRSA infections. Performed at [facility], [place] [address], [place],  [address]     Radiology Reports X-ray Chest Pa And Lateral  Result Date: 02/23/2019 CLINICAL DATA:  Decreased O2 saturations. EXAM: CHEST - 2 VIEW COMPARISON:  Multiple previous  chest x-rays. The most recent is 02/22/2019 FINDINGS: The heart is enlarged but stable. Persistent central vascular congestion and interstitial and airspace process, likely pulmonary edema. Slight improved aeration since yesterday's film. No definite pleural effusions or focal infiltrates. IMPRESSION: Persistent but improving CHF. Electronically Signed   By: Marijo Sanes M.D.   On: 02/23/2019 10:33   Dg Chest Port 1 View  Result Date: 02/22/2019 CLINICAL DATA:  58 y/o M; history of COPD and CHF. Shortness of breath. EXAM: PORTABLE CHEST 1 VIEW COMPARISON:  04/30/2018 chest radiograph. FINDINGS: Cardiomegaly, CABG, and median sternotomy postsurgical changes. Diffuse patchy opacifications of the lungs. Possible small right effusion. No pneumothorax. No acute osseous abnormality is evident. IMPRESSION: Diffuse patchy opacifications of the lungs probably represents pulmonary edema. Possible small right effusion. Cardiomegaly. Electronically Signed   By: Kristine Garbe M.D.   On: 02/22/2019 04:09    Lab Data:  CBC: Recent Labs  Lab 02/22/19 0324 02/22/19 0934 02/23/19 0222  WBC 11.6* 11.9* 14.7*  NEUTROABS 8.7* 10.7*  --   HGB 12.5* 12.9* 12.7*  HCT 45.9 45.0 45.1  MCV 108.0* 108.2* 104.9*  PLT 223 194 884   Basic Metabolic Panel: Recent Labs  Lab 02/22/19 0324 02/23/19 0222  NA 142 140  K 4.4 3.6  CL 99 97*  CO2 34* 32  GLUCOSE 243* 281*  BUN 30* 39*  CREATININE 2.41* 2.60*  CALCIUM 8.5* 8.8*   GFR: Estimated Creatinine Clearance: 49.3 mL/min (A) (by C-G formula based on SCr of 2.6 mg/dL (H)). Liver Function Tests: Recent Labs  Lab 02/22/19 0324 02/23/19 0222  AST 21 15  ALT 25 22  ALKPHOS 231* 201*  BILITOT 0.2* 0.8  PROT 8.1 8.2*  ALBUMIN 3.4* 3.1*   No results for input(s): LIPASE, AMYLASE in the last 168 hours. No results for input(s): AMMONIA in the last  168 hours. Coagulation Profile: No results for input(s): INR, PROTIME in the last 168 hours.  Cardiac Enzymes: No results for input(s): CKTOTAL, CKMB, CKMBINDEX, TROPONINI in the last 168 hours. BNP (last 3 results) No results for input(s): PROBNP in the last 8760 hours. HbA1C: No results for input(s): HGBA1C in the last 72 hours. CBG: Recent Labs  Lab 02/23/19 0815 02/23/19 1209 02/23/19 1618 02/23/19 2214 02/24/19 0842  GLUCAP 336* 311* 300* 290* 291*   Lipid Profile: No results for input(s): CHOL, HDL, LDLCALC, TRIG, CHOLHDL, LDLDIRECT in the last 72 hours. Thyroid Function Tests: Recent Labs    02/22/19 0934  TSH 0.372   Anemia Panel: No results for input(s): VITAMINB12, FOLATE, FERRITIN, TIBC, IRON, RETICCTPCT in the last 72 hours. Urine analysis:    Component Value Date/Time   COLORURINE YELLOW 02/22/2019 0822   APPEARANCEUR CLEAR 02/22/2019 0822   LABSPEC 1.011 02/22/2019 0822   PHURINE 7.0 02/22/2019 0822   GLUCOSEU 150 (A) 02/22/2019 0822   HGBUR NEGATIVE 02/22/2019 0822   BILIRUBINUR NEGATIVE 02/22/2019 0822   KETONESUR NEGATIVE 02/22/2019 0822   PROTEINUR NEGATIVE 02/22/2019 0822   UROBILINOGEN 0.2 11/19/2012 2217   NITRITE NEGATIVE 02/22/2019 0822   LEUKOCYTESUR TRACE (A) 02/22/2019 0347     Ripudeep Rai M.D. [facility] 02/24/2019, 11:55 AM  Pager: [phone] Between 7am to 7pm - call Pager - [phone]  After 7pm go to [email or web address] - password [email or web address]  Call night coverage person covering after 7pm

## 2019-02-25 ENCOUNTER — Ambulatory Visit: Payer: Self-pay | Admitting: Pharmacist

## 2019-02-25 ENCOUNTER — Other Ambulatory Visit: Payer: Self-pay | Admitting: Pharmacist

## 2019-02-25 LAB — BASIC METABOLIC PANEL
Anion gap: 12 (ref 5–15)
BUN: 71 mg/dL — ABNORMAL HIGH (ref 6–20)
CO2: 32 mmol/L (ref 22–32)
Calcium: 8.5 mg/dL — ABNORMAL LOW (ref 8.9–10.3)
Chloride: 98 mmol/L (ref 98–111)
Creatinine, Ser: 2.77 mg/dL — ABNORMAL HIGH (ref 0.61–1.24)
GFR calc Af Amer: 28 mL/min — ABNORMAL LOW (ref >60)
GFR calc non Af Amer: 24 mL/min — ABNORMAL LOW (ref >60)
Glucose, Bld: 156 mg/dL — ABNORMAL HIGH (ref 70–99)
Potassium: 3.4 mmol/L — ABNORMAL LOW (ref 3.5–5.1)
Sodium: 142 mmol/L (ref 135–145)

## 2019-02-25 LAB — GLUCOSE, CAPILLARY
Glucose-Capillary: 183 mg/dL — ABNORMAL HIGH (ref 70–99)
Glucose-Capillary: 208 mg/dL — ABNORMAL HIGH (ref 70–99)
Glucose-Capillary: 245 mg/dL — ABNORMAL HIGH (ref 70–99)
Glucose-Capillary: 230 mg/dL — ABNORMAL HIGH (ref 70–99)

## 2019-02-25 LAB — HIV ANTIBODY (ROUTINE TESTING W REFLEX): HIV Screen 4th Generation wRfx: NONREACTIVE

## 2019-02-25 MED ORDER — FUROSEMIDE 40 MG PO TABS
60.0000 mg | ORAL_TABLET | Freq: Two times a day (BID) | ORAL | Status: DC
  Administered 2019-02-25 – 2019-02-27 (×4): 60 mg via ORAL
  Filled 2019-02-25 (×4): qty 1

## 2019-02-25 NOTE — Patient Outreach (Addendum)
[facility]) Care Management [place]  02/25/2019  Lucile Didonato. 11-21-1960 [phone]  Planned call to patient today to check in on new diabetes regimen.  Patient currently admitted for COPD exacerbation.  Will receive alert when patient discharged and reach out at that time.   Regina Eck, PharmD, [place]  [phone]

## 2019-02-25 NOTE — Progress Notes (Signed)
Progress Note  Patient Name: [name]. Date of Encounter: 02/25/2019  Primary Cardiologist:   No primary care provider on file.   Subjective   Breathing OK.  No pain.  He complains of a dry eye  Inpatient Medications    Scheduled Meds: . amiodarone  100 mg Oral Daily  . amoxicillin-clavulanate  1 tablet Oral Q12H  . apixaban  5 mg Oral BID  . arformoterol  15 mcg Nebulization BID  . carvedilol  12.5 mg Oral BID WC  . chlorhexidine  15 mL Mouth Rinse BID  . Chlorhexidine Gluconate Cloth  6 each Topical Daily  . feeding supplement (PRO-STAT SUGAR FREE 64)  30 mL Oral TID BM  . furosemide  60 mg Intravenous Q12H  . guaiFENesin  1,200 mg Oral BID  . insulin aspart  0-15 Units Subcutaneous TID WC  . insulin aspart  0-5 Units Subcutaneous QHS  . insulin aspart  4 Units Subcutaneous TID WC  . insulin glargine  40 Units Subcutaneous QHS  . isosorbide mononitrate  30 mg Oral Daily  . levalbuterol  0.63 mg Nebulization BID  . mouth rinse  15 mL Mouth Rinse q12n4p  . multivitamin with minerals  1 tablet Oral Daily  . Ensure Max Protein  11 oz Oral Daily  . sodium chloride flush  3 mL Intravenous Q12H  . topiramate  100 mg Oral QHS   Continuous Infusions: . sodium chloride     PRN Meds: sodium chloride, acetaminophen **OR** acetaminophen, artificial tears, hydrALAZINE, HYDROcodone-acetaminophen, ipratropium, ondansetron **OR** ondansetron (ZOFRAN) IV, polyethylene glycol, polyvinyl alcohol, sodium chloride flush   Vital Signs    Vitals:   02/24/19 2142 02/24/19 2148 02/25/19 0343 02/25/19 0740  BP:  (!) 161/82 132/63   Pulse: (!) 57 (!) 57 62 65  Resp:  20 20 17   Temp:  98 F (36.7 C) 97.8 F (36.6 C)   TempSrc:  Oral Oral   SpO2: 98% 97% 95% 93%  Weight:      Height:        Intake/Output Summary (Last 24 hours) at 02/25/2019 1120 Last data filed at 02/25/2019 1000 Gross per 24 hour  Intake 705 ml  Output 4175 ml  Net -3470 ml   Filed Weights   02/22/19 0900 02/23/19 0500 02/24/19 0500  Weight: (!) 169.3 kg (!) 171.2 kg (!) 172 kg    Telemetry    NSR - Personally Reviewed  ECG    NA - Personally Reviewed  Physical Exam   GEN: No acute distress.   Neck: No  JVD Cardiac: RRR, no murmurs, rubs, or gallops.  Respiratory: Clear to auscultation bilaterally. GI: Soft, nontender, non-distended  MS:   Mild edema; No deformity. Neuro:  Nonfocal except facial drop Psych: Normal affect   Labs    Chemistry Recent Labs  Lab 02/22/19 0324 02/23/19 0222 02/24/19 1213 02/25/19 0457  NA 142 140 139 142  K 4.4 3.6 4.0 3.4*  CL 99 97* 93* 98  CO2 34* 32 33* 32  GLUCOSE 243* 281* 324* 156*  BUN 30* 39* 67* 71*  CREATININE 2.41* 2.60* 2.86* 2.77*  CALCIUM 8.5* 8.8* 8.4* 8.5*  PROT 8.1 8.2*  --   --   ALBUMIN 3.4* 3.1*  --   --   AST 21 15  --   --   ALT 25 22  --   --   ALKPHOS 231* 201*  --   --   BILITOT 0.2* 0.8  --   --  GFRNONAA 29* 26* 23* 24*  GFRAA 33* 30* 27* 28*  ANIONGAP 9 11 13 12      Hematology Recent Labs  Lab 02/22/19 0324 02/22/19 0934 02/23/19 0222  WBC 11.6* 11.9* 14.7*  RBC 4.25 4.16* 4.30  HGB 12.5* 12.9* 12.7*  HCT 45.9 45.0 45.1  MCV 108.0* 108.2* 104.9*  MCH 29.4 31.0 29.5  MCHC 27.2* 28.7* 28.2*  RDW 12.7 12.8 12.6  PLT 223 194 272    Cardiac EnzymesNo results for input(s): TROPONINI in the last 168 hours. No results for input(s): TROPIPOC in the last 168 hours.   BNP Recent Labs  Lab 02/22/19 0324  BNP 230.6*     DDimer No results for input(s): DDIMER in the last 168 hours.   Radiology    No results found.  Cardiac Studies   ECHO:  1. The left ventricle has normal systolic function, with an ejection fraction of 55-60%. The cavity size was mildly dilated. There is mildly increased left ventricular wall thickness. 2. Left atrial size was mildly dilated. 3. Extremely limited; definity used; normal LV systolic function; focal wall motion abnormality cannot be  excluded; mild LVH; mild LVE.  Patient Profile     58 y.o. male with a hx of CAD s/p CABG, chronic diastolic CHF who is being seen for the evaluation ofacute on chronic CHF.  Assessment & Plan    ACUTE ON CHRONIC DIASTOLIC HF:    Negative 9,4 liters since admit.  Can change to PO diuretics  ACUTE RESPIRATORY FAILURE:  Hypoventilation obesity syndrome.  Improved.     PAF:  Maintaining NSR on amiodarone.  Continue DOAC.   No change in therapy.     AKI:    Creat up but stable.     HTN:  This is being managed in the context of treating his CHF    For questions or updates, please contact [facility] Please consult [email or web address] for contact info under Cardiology/STEMI.   Signed, Minus Breeding, MD  02/25/2019, 11:20 AM

## 2019-02-25 NOTE — Progress Notes (Signed)
Physical Therapy Treatment Patient Details Name: [name]. MRN: [id number] DOB: 26-Feb-1961 Today's Date: 02/25/2019    History of Present Illness Pt is a 58 y.o. male who presented to the ED with shortness of breath and somnolence. He was admitted with acute on chronic respiratory failure with hypoxia and hypercapnia and CHF exacerbation. He has a PMH significant for brain tumor s/p craniotomy, CAD s/p CABG with LIMA to LAD, fibromyalgia, gout, afib, CHF, COPD on 3 L O2, CKD stage III, DM-2, CVA with residual right sided weakness.     PT Comments    Patient progressing with ambulation distance, but needed extra time for toileting needs.  Currently remains appropriate for home with assist and HHPT.  Feel he needs daily mobility to maintain strength and stability.  Not really able to ask nursing to assist him.  Requesting nursing to offer daily walks.    Follow Up Recommendations  Home health PT     Equipment Recommendations  None recommended by PT    Recommendations for Other Services       Precautions / Restrictions Precautions Precautions: Fall Precaution Comments: O2 dependent    Mobility  Bed Mobility Overal bed mobility: Needs Assistance       Supine to sit: Min assist     General bed mobility comments: assist to lift trunk  Transfers Overall transfer level: Needs assistance Equipment used: 4-wheeled walker Transfers: Sit to/from Stand Sit to Stand: Min guard;Min assist         General transfer comment: up from toilet min A, from EOB minguard with heavy anterior lean  Ambulation/Gait Ambulation/Gait assistance: Min guard Gait Distance (Feet): 90 Feet Assistive device: 4-wheeled walker Gait Pattern/deviations: Step-through pattern;Decreased stride length;Shuffle;Trunk flexed;Wide base of support     General Gait Details: assist for safety, on 4L O2 throughout with dyspnea 2/4   Stairs             Wheelchair Mobility    Modified  Rankin (Stroke Patients Only)       Balance Overall balance assessment: Needs assistance   Sitting balance-Leahy Scale: Good     Standing balance support: Bilateral upper extremity supported Standing balance-Leahy Scale: Poor Standing balance comment: UE support for balance                            Cognition Arousal/Alertness: Awake/alert Behavior During Therapy: WFL for tasks assessed/performed Overall Cognitive Status: History of cognitive impairments - at baseline                                 General Comments: likely at baseline      Exercises      General Comments General comments (skin integrity, edema, etc.): focused on catheter not working and RN in to remove but did not replace, encouragement and options given for toileting including BSC, male and male urinals.  Toileted in bathroom on commode and requested assist to "get the fat out of the way so he could pee"      Pertinent Vitals/Pain Pain Assessment: Faces Faces Pain Scale: Hurts little more Pain Location: back Pain Descriptors / Indicators: Discomfort Pain Intervention(s): Monitored during session;Repositioned    Home Living                      Prior Function  PT Goals (current goals can now be found in the care plan section) Progress towards PT goals: Progressing toward goals    Frequency    Min 3X/week      PT Plan Current plan remains appropriate    Co-evaluation              AM-PAC PT "6 Clicks" Mobility   Outcome Measure  Help needed turning from your back to your side while in a flat bed without using bedrails?: A Little Help needed moving from lying on your back to sitting on the side of a flat bed without using bedrails?: A Little Help needed moving to and from a bed to a chair (including a wheelchair)?: A Little Help needed standing up from a chair using your arms (e.g., wheelchair or bedside chair)?: A Little Help needed  to walk in hospital room?: A Little Help needed climbing 3-5 steps with a railing? : A Lot 6 Click Score: 17    End of Session Equipment Utilized During Treatment: Oxygen Activity Tolerance: Patient tolerated treatment well Patient left: in chair;with call bell/phone within reach   PT Visit Diagnosis: Difficulty in walking, not elsewhere classified (R26.2);Muscle weakness (generalized) (M62.81)     Time: 1315-1410 PT Time Calculation (min) (ACUTE ONLY): 55 min  Charges:  $Gait Training: 8-22 mins $Therapeutic Activity: 23-37 mins                     Magda Kiel, [place] [facility] [phone] 02/25/2019    Reginia Naas 02/25/2019, 3:17 PM

## 2019-02-25 NOTE — Progress Notes (Signed)
Pt refused BiPAP qhs.  Pt states that it just keeps him awake.  Pt encouraged to contact RT should he change his mind.

## 2019-02-25 NOTE — Progress Notes (Signed)
  Speech Language Pathology Treatment: Dysphagia  Patient Details Name: [name]. MRN: [id number] DOB: [birth year] Today's Date: 02/25/2019 Time: 1450-     Assessment / Plan / Recommendation Clinical Impression  Patient seen to address dysphagia goals and provided education regarding swallow safety. Patient had just finished PT session and was sitting in recliner. He did not exhibit any of the hocking or oral secretions as he did upon evaluation. He tolerated straw sips of thin liquids and puree solids without overt s/s of aspiration or penetration and he self-managed oral residuals using yaunkers suction. Patient did not have any c/o difficulty with meals and stated that "everything has tasted really good". No reports of patient having difficulty with diet consistencies. He appears to be improving overall.    HPI HPI: Patient is a 58 y.o. male with PMH: brain tumor s/p craniotom, CAD s/p CABG with LIMA to LAD, fibromyalgia, gout, afib, CHF, COPD on 3 L O2, CKD stage III, DM-2, CVA with residual right sided weakness. He presented to hospital with c/o SOB and somnolence and was confused. ABG done in ED which showed respiratory acidosis, CXR showed diffuse patchy opacifications of lungs consistent with probable pulmonary edema. Paitient was placed on 15 L NRB .      SLP Plan  Continue with current plan of care       Recommendations  Diet recommendations: Dysphagia 3 (mechanical soft);Thin liquid Liquids provided via: Cup;Straw Medication Administration: Crushed with puree Supervision: Patient able to self feed;Intermittent supervision to cue for compensatory strategies Compensations: Minimize environmental distractions;Lingual sweep for clearance of pocketing Postural Changes and/or Swallow Maneuvers: Seated upright 90 degrees                Oral Care Recommendations: Oral care BID Follow up Recommendations: Other (comment) SLP Visit Diagnosis: Dysphagia, unspecified  (R13.10) Plan: Continue with current plan of care       GO                Dannial Monarch 02/25/2019, 5:25 PM    Sonia Baller, MA, [place] [facility] Pager: [phone]

## 2019-02-25 NOTE — Progress Notes (Signed)
[facility]                                                                              Patient Demographics  [name], is a 58 y.o. male, DOB - 28-May-1961, VEH:[id number]  Admit date - 02/22/2019   Admitting Physician Rise Patience, MD  Outpatient Primary MD for the patient is Caren Macadam, MD  Outpatient specialists:   LOS - 3  days   Medical records reviewed and are as summarized below:    Chief Complaint  Patient presents with  . COPD  . Shortness of Breath       Brief summary   .  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  Patient was brought to ED via EMS after his wife called 911 for dyspnea.  Prior to this he had taken a pain pill.  In ED, ABG showed respiratory acidosis and patient was placed on 15 L nonrebreather.  He was admitted to stepdown unit for BiPAP, steroids and diuresis.  Cardiology was consulted for suspected CHF exacerbation.  He received a dose of Lasix, steroids, MDI inhaler.  Patient was admitted to stepdown unit. COVID-19 test negative   Assessment & Plan    Principal Problem:   Acute on chronic respiratory failure with hypoxia and hypercapnia (HCC) -Likely precipitated due to acute on chronic COPD exacerbation, acute on chronic diastolic CHF exacerbation, OHS, likely somnolence from ?  Pain medication -Will likely benefit from outpatient sleep study and possible CPAP, if he is compliant -Wean O2 as tolerated, currently 93% on 3 L (at baseline, weaned from 4 L this morning)  Active Problems:   Acute exacerbation of chronic obstructive pulmonary disease (COPD) ([place])  -Wheezing improving, patient was transitioned  to oral prednisone, he refused prednisone as well as Pulmicort, states he gets tachycardia after steroids.  Patient had tolerated IV steroids -Continue Xopenex, Brovana, flutter valve.   -Continue O2, wean as tolerated to baseline 3 L at home    Acute on chronic diastolic CHF (congestive heart failure) (HCC) -Shortness of breath is improving,  Outpatient on torsemide 60 mg twice daily, troponins x3 neg -Negative balance of 9.6 L, currently on IV Lasix, cardiology following -2D echo showed EF of 55 to 60%, normal LV systolic function, mild LVH, mild LVE -follow Bmet  Paroxysmal atrial fibrillation - DCCV in 2019, remains in sinus rhythm -Continue Coreg, amiodarone, rate currently controlled -Continue Eliquis    CAD (coronary artery disease) of artery bypass graft, history of CABG with LIMA to LAD -Cardiology following, no acute ACS, troponins x3- -Continue Coreg, Imdur, amiodarone, Eliquis    Morbid obesity (HCC) -BMI 54.1, counseled patient on diet and weight control   Acute on CKD (chronic kidney disease) stage 3, GFR 30-59 ml/min (HCC) -Baseline creatinine 1.6-1.9, worsened likely due to decreased renal perfusion from acute heart failure,  respiratory failure -Continue IV Lasix, was decreased yesterday, follow renal function    DM (diabetes mellitus) type II uncontrolled, periph vascular disorder (HCC) -Uncontrolled with hyperglycemia likely due to IV steroids -Prednisone discontinued, CBGs more stable today.   -Continue Lantus, sliding scale insulin moderate.    History of CVA status post acoustic neuroma surgery Continue Eliquis  History of brain tumor status post craniotomy -Continue with Topamax  Chronic pain syndrome Patient had presented with acute hypercarbic respiratory failure, somnolence after taking pain pill at home, will decrease dose of Norco.  Recommended judicious use of narcotics and follow closely with PCP.  Code Status: Full CODE STATUS DVT Prophylaxis:  Eliquis Family Communication: Discussed in detail with the patient, all imaging results, lab results explained to the patient's wife on the phone yesterday   Disposition Plan: Hopefully DC home in a.m. if continues to improve, cleared by cardiology  Time Spent in minutes 25 minutes  Procedures:  2D echo  Consultants:   Cardiology  Antimicrobials:   Anti-infectives (From admission, onward)   Start     Dose/Rate Route Frequency Ordered Stop   02/24/19 2200  amoxicillin-clavulanate (AUGMENTIN) 875-125 MG per tablet 1 tablet     1 tablet Oral Every 12 hours 02/24/19 1632     02/22/19 1330  doxycycline (VIBRA-TABS) tablet 100 mg  Status:  Discontinued     100 mg Oral Every 12 hours 02/22/19 1219 02/24/19 1632         Medications  Scheduled Meds: . amiodarone  100 mg Oral Daily  . amoxicillin-clavulanate  1 tablet Oral Q12H  . apixaban  5 mg Oral BID  . arformoterol  15 mcg Nebulization BID  . carvedilol  12.5 mg Oral BID WC  . chlorhexidine  15 mL Mouth Rinse BID  . Chlorhexidine Gluconate Cloth  6 each Topical Daily  . feeding supplement (PRO-STAT SUGAR FREE 64)  30 mL Oral TID BM  . furosemide  60 mg Oral BID  . guaiFENesin  1,200 mg Oral BID  . insulin aspart  0-15 Units Subcutaneous TID WC  . insulin aspart  0-5 Units Subcutaneous QHS  . insulin aspart  4 Units Subcutaneous TID WC  . insulin glargine  40 Units Subcutaneous QHS  . isosorbide mononitrate  30 mg Oral Daily  . levalbuterol  0.63 mg Nebulization BID  . mouth rinse  15 mL Mouth Rinse q12n4p  . multivitamin with minerals  1 tablet Oral Daily  . Ensure Max Protein  11 oz Oral Daily  . sodium chloride flush  3 mL Intravenous Q12H  . topiramate  100 mg Oral QHS   Continuous Infusions: . sodium chloride     PRN Meds:.sodium chloride, acetaminophen **OR** acetaminophen, artificial tears, hydrALAZINE, HYDROcodone-acetaminophen, ipratropium, ondansetron **OR** ondansetron (ZOFRAN) IV, polyethylene glycol,  polyvinyl alcohol, sodium chloride flush      Subjective:   Bennie Chirico was seen and examined today.  Refused inhaled steroids, oral prednisone today.  Shortness of breath is improving, no chest pain, no fevers or chills.   Patient denies dizziness,  abdominal pain, N/V/D/C, new weakness, numbess, tingling.  Objective:   Vitals:   02/24/19 2142 02/24/19 2148 02/25/19 0343 02/25/19 0740  BP:  (!) 161/82 132/63   Pulse: (!) 57 (!) 57 62 65  Resp:  20 20 17   Temp:  98 F (36.7 C) 97.8 F (36.6 C)   TempSrc:  Oral Oral   SpO2: 98% 97% 95% 93%  Weight:      Height:  Intake/Output Summary (Last 24 hours) at 02/25/2019 1226 Last data filed at 02/25/2019 1000 Gross per 24 hour  Intake 705 ml  Output 4175 ml  Net -3470 ml     Wt Readings from Last 3 Encounters:  02/24/19 (!) 172 kg  02/07/19 (!) 176 kg  02/06/19 (!) 176 kg   Physical Exam  General: Alert and oriented x 3, NAD  Eyes:   HEENT:  Atraumatic, normocephalic  Cardiovascular: S1 S2 clear, no murmurs, RRR. 1+ pedal edema b/l  Respiratory: Diminished breath sounds bilaterally, wheezing improved  Gastrointestinal: Soft, nontender, nondistended, NBS  Ext: 1+  pedal edema bilaterally  Neuro: no new deficits  Musculoskeletal: No cyanosis, clubbing  Skin: No rashes  Psych: Normal affect and demeanor, alert and oriented x3     Data Reviewed:  I have personally reviewed following labs and imaging studies  Micro Results Recent Results (from the past 240 hour(s))  SARS Coronavirus 2 (CEPHEID- Performed in [facility] hospital lab), Hosp Order     Status: None   Collection Time: 02/22/19  3:24 AM   Specimen: Nasopharyngeal Swab  Result Value Ref Range Status   SARS Coronavirus 2 NEGATIVE NEGATIVE Final    Comment: (NOTE) If result is NEGATIVE SARS-CoV-2 target nucleic acids are NOT DETECTED. The SARS-CoV-2 RNA is generally detectable in upper and lower  respiratory specimens during the acute  phase of infection. The lowest  concentration of SARS-CoV-2 viral copies this assay can detect is 250  copies / mL. A negative result does not preclude SARS-CoV-2 infection  and should not be used as the sole basis for treatment or other  patient management decisions.  A negative result may occur with  improper specimen collection / handling, submission of specimen other  than nasopharyngeal swab, presence of viral mutation(s) within the  areas targeted by this assay, and inadequate number of viral copies  (<250 copies / mL). A negative result must be combined with clinical  observations, patient history, and epidemiological information. If result is POSITIVE SARS-CoV-2 target nucleic acids are DETECTED. The SARS-CoV-2 RNA is generally detectable in upper and lower  respiratory specimens dur ing the acute phase of infection.  Positive  results are indicative of active infection with SARS-CoV-2.  Clinical  correlation with patient history and other diagnostic information is  necessary to determine patient infection status.  Positive results do  not rule out bacterial infection or co-infection with other viruses. If result is PRESUMPTIVE POSTIVE SARS-CoV-2 nucleic acids MAY BE PRESENT.   A presumptive positive result was obtained on the submitted specimen  and confirmed on repeat testing.  While 2019 novel coronavirus  (SARS-CoV-2) nucleic acids may be present in the submitted sample  additional confirmatory testing may be necessary for epidemiological  and / or clinical management purposes  to differentiate between  SARS-CoV-2 and other Sarbecovirus currently known to infect humans.  If clinically indicated additional testing with an alternate test  methodology [phone]) is advised. The SARS-CoV-2 RNA is generally  detectable in upper and lower respiratory sp ecimens during the acute  phase of infection. The expected result is Negative. Fact Sheet for Patients:   StrictlyIdeas.no Fact Sheet for Healthcare Providers: BankingDealers.co.za This test is not yet approved or cleared by the [place] FDA and has been authorized for detection and/or diagnosis of SARS-CoV-2 by FDA under an Emergency Use Authorization (EUA).  This EUA will remain in effect (meaning this test can be used) for the duration of the COVID-19 declaration under Section  564(b)(1) of the Act, 21 U.S.C. section 360bbb-3(b)(1), unless the authorization is terminated or revoked sooner. Performed at [facility], [place] [address] [place], [place] [address]   Urine culture     Status: Abnormal   Collection Time: 02/22/19  8:15 AM   Specimen: Urine, Clean Catch  Result Value Ref Range Status   Specimen Description   Final    URINE, CLEAN CATCH Performed at [facility], [place] [address], [place], [place] [address]    Special Requests   Final    NONE Performed at [facility], [place] [address] [place], [place] [address]    Culture 50,000 COLONIES/mL PROTEUS MIRABILIS (A)  Final   Report Status 02/24/2019 FINAL  Final   Organism ID, Bacteria PROTEUS MIRABILIS (A)  Final      Susceptibility   Proteus mirabilis - MIC*    AMPICILLIN <=2 SENSITIVE Sensitive     CEFAZOLIN <=4 SENSITIVE Sensitive     CEFTRIAXONE <=1 SENSITIVE Sensitive     CIPROFLOXACIN <=0.25 SENSITIVE Sensitive     GENTAMICIN <=1 SENSITIVE Sensitive     IMIPENEM 2 SENSITIVE Sensitive     NITROFURANTOIN 128 RESISTANT Resistant     TRIMETH/SULFA <=20 SENSITIVE Sensitive     AMPICILLIN/SULBACTAM <=2 SENSITIVE Sensitive     PIP/TAZO <=4 SENSITIVE Sensitive     * 50,000 COLONIES/mL PROTEUS MIRABILIS  MRSA PCR Screening     Status: None   Collection Time: 02/22/19 10:03 AM   Specimen: Nasopharyngeal  Result Value Ref Range Status   MRSA by PCR NEGATIVE NEGATIVE Final    Comment:        The GeneXpert MRSA Assay  (FDA approved for NASAL specimens only), is one component of a comprehensive MRSA colonization surveillance program. It is not intended to diagnose MRSA infection nor to guide or monitor treatment for MRSA infections. Performed at [facility], [place] [address] [place], [place] [address]     Radiology Reports X-ray Chest Pa And Lateral  Result Date: 02/23/2019 CLINICAL DATA:  Decreased O2 saturations. EXAM: CHEST - 2 VIEW COMPARISON:  Multiple previous chest x-rays. The most recent is 02/22/2019 FINDINGS: The heart is enlarged but stable. Persistent central vascular congestion and interstitial and airspace process, likely pulmonary edema. Slight improved aeration since yesterday's film. No definite pleural effusions or focal infiltrates. IMPRESSION: Persistent but improving CHF. Electronically Signed   By: Marijo Sanes M.D.   On: 02/23/2019 10:33   Dg Chest Port 1 View  Result Date: 02/22/2019 CLINICAL DATA:  57 y/o M; history of COPD and CHF. Shortness of breath. EXAM: PORTABLE CHEST 1 VIEW COMPARISON:  04/30/2018 chest radiograph. FINDINGS: Cardiomegaly, CABG, and median sternotomy postsurgical changes. Diffuse patchy opacifications of the lungs. Possible small right effusion. No pneumothorax. No acute osseous abnormality is evident. IMPRESSION: Diffuse patchy opacifications of the lungs probably represents pulmonary edema. Possible small right effusion. Cardiomegaly. Electronically Signed   By: Kristine Garbe M.D.   On: 02/22/2019 04:09    Lab Data:  CBC: Recent Labs  Lab 02/22/19 0324 02/22/19 0934 02/23/19 0222  WBC 11.6* 11.9* 14.7*  NEUTROABS 8.7* 10.7*  --   HGB 12.5* 12.9* 12.7*  HCT 45.9 45.0 45.1  MCV 108.0* 108.2* 104.9*  PLT 223 194 637   Basic Metabolic Panel: Recent Labs  Lab 02/22/19 0324 02/23/19 0222 02/24/19 1213 02/25/19 0457  NA 142 140 139 142  K 4.4 3.6 4.0 3.4*  CL 99 97* 93* 98  CO2 34* 32 33* 32  GLUCOSE 243* 281*  324* 156*  BUN 30* 39* 67* 71*  CREATININE 2.41* 2.60* 2.86* 2.77*  CALCIUM 8.5* 8.8* 8.4* 8.5*   GFR: Estimated Creatinine Clearance: 46.3 mL/min (A) (by C-G formula based on SCr of 2.77 mg/dL (H)). Liver Function Tests: Recent Labs  Lab 02/22/19 0324 02/23/19 0222  AST 21 15  ALT 25 22  ALKPHOS 231* 201*  BILITOT 0.2* 0.8  PROT 8.1 8.2*  ALBUMIN 3.4* 3.1*   No results for input(s): LIPASE, AMYLASE in the last 168 hours. No results for input(s): AMMONIA in the last 168 hours. Coagulation Profile: No results for input(s): INR, PROTIME in the last 168 hours. Cardiac Enzymes: No results for input(s): CKTOTAL, CKMB, CKMBINDEX, TROPONINI in the last 168 hours. BNP (last 3 results) No results for input(s): PROBNP in the last 8760 hours. HbA1C: No results for input(s): HGBA1C in the last 72 hours. CBG: Recent Labs  Lab 02/24/19 0842 02/24/19 1150 02/24/19 1632 02/24/19 2154 02/25/19 0735  GLUCAP 291* 298* 232* 171* 183*   Lipid Profile: No results for input(s): CHOL, HDL, LDLCALC, TRIG, CHOLHDL, LDLDIRECT in the last 72 hours. Thyroid Function Tests: No results for input(s): TSH, T4TOTAL, FREET4, T3FREE, THYROIDAB in the last 72 hours. Anemia Panel: No results for input(s): VITAMINB12, FOLATE, FERRITIN, TIBC, IRON, RETICCTPCT in the last 72 hours. Urine analysis:    Component Value Date/Time   COLORURINE YELLOW 02/22/2019 0822   APPEARANCEUR CLEAR 02/22/2019 0822   LABSPEC 1.011 02/22/2019 0822   PHURINE 7.0 02/22/2019 0822   GLUCOSEU 150 (A) 02/22/2019 0822   HGBUR NEGATIVE 02/22/2019 0822   BILIRUBINUR NEGATIVE 02/22/2019 0822   KETONESUR NEGATIVE 02/22/2019 0822   PROTEINUR NEGATIVE 02/22/2019 0822   UROBILINOGEN 0.2 11/19/2012 2217   NITRITE NEGATIVE 02/22/2019 0822   LEUKOCYTESUR TRACE (A) 02/22/2019 5009     Hydeia Mcatee M.D. [facility] 02/25/2019, 12:26 PM  Pager: [phone] Between 7am to 7pm - call Pager - [phone]  After 7pm go to  [email or web address] - password [email or web address]  Call night coverage person covering after 7pm

## 2019-02-25 NOTE — Progress Notes (Signed)
Assumed care of patient at this time. Patient is stable with no complaints at this time. Agree with previously documented assessment. Will continue to monitor patient.   

## 2019-02-25 NOTE — Care Management Important Message (Signed)
Important Message  Patient Details IM Letter given to Dessa Phi RN to present to the Patient Name: [name]. MRN: [id number] Date of Birth: 28-Dec-1960   Medicare Important Message Given:  Yes     Kerin Salen 02/25/2019, 10:54 AM

## 2019-02-26 DIAGNOSIS — E662 Morbid (severe) obesity with alveolar hypoventilation: Secondary | ICD-10-CM

## 2019-02-26 LAB — BASIC METABOLIC PANEL
Anion gap: 9 (ref 5–15)
BUN: 71 mg/dL — ABNORMAL HIGH (ref 6–20)
CO2: 34 mmol/L — ABNORMAL HIGH (ref 22–32)
Calcium: 8.4 mg/dL — ABNORMAL LOW (ref 8.9–10.3)
Chloride: 96 mmol/L — ABNORMAL LOW (ref 98–111)
Creatinine, Ser: 2.65 mg/dL — ABNORMAL HIGH (ref 0.61–1.24)
GFR calc Af Amer: 29 mL/min — ABNORMAL LOW (ref >60)
GFR calc non Af Amer: 25 mL/min — ABNORMAL LOW (ref >60)
Glucose, Bld: 218 mg/dL — ABNORMAL HIGH (ref 70–99)
Potassium: 3.5 mmol/L (ref 3.5–5.1)
Sodium: 139 mmol/L (ref 135–145)

## 2019-02-26 LAB — GLUCOSE, CAPILLARY
Glucose-Capillary: 211 mg/dL — ABNORMAL HIGH (ref 70–99)
Glucose-Capillary: 238 mg/dL — ABNORMAL HIGH (ref 70–99)
Glucose-Capillary: 217 mg/dL — ABNORMAL HIGH (ref 70–99)
Glucose-Capillary: 184 mg/dL — ABNORMAL HIGH (ref 70–99)

## 2019-02-26 NOTE — TOC Initial Note (Signed)
Transition of Care (TOC) - Initial/Assessment Note    Patient Details  Name: [name]. MRN: [id number] Date of Birth: 07/03/1961  Transition of Care [facility]) CM/SW Contact:    Dessa Phi, RN Phone Number: 02/26/2019, 2:48 PM  Clinical Narrative: From home w/spouse Bryna Colander, has home [employer],has travel tank. Spoke to patient/[name] on speaker phone to confirm d/c plans per patient request-They agree to HHC-HHRN-meds/PT/OT-[employer] rep [facility] following. Confirmed with patient he does not have CPAP, & does not want a CPAP machine-states "if I stop breathing then its time for the good Lord to take me" Patient has [facility]-chronic special needs program following-patient agrees to the service rep [place] aware & following. Confirmed patient's pcp-Dr. Micheline Rough.Patient has pcp,pharmacy,own transportation.                  Expected Discharge Plan: [place] Barriers to Discharge: Continued Medical Work up   Patient Goals and CMS Choice Patient states their goals for this hospitalization and ongoing recovery are:: be able to go home CMS Medicare.gov Compare Post Acute Care list provided to:: Patient Represenative (must comment)(wife) Choice offered to / list presented to : Spouse  Expected Discharge Plan and Services Expected Discharge Plan: [place] In-house Referral: Clinical Social Work Discharge Planning Services: CM Consult Post Acute Care Choice: [place] arrangements for the past 2 months: Single Family Home                                      Prior Living Arrangements/Services Living arrangements for the past 2 months: Single Family Home Lives with:: Spouse Patient language and need for interpreter reviewed:: No Do you feel safe going back to the place where you live?: Yes      Need for Family Participation in Patient Care: Yes (Comment)(wife) Care giver support system in place?: Yes  (comment)(wife) Current home services: DME Criminal Activity/Legal Involvement Pertinent to Current Situation/Hospitalization: No - Comment as needed  Activities of Daily Living Home Assistive Devices/Equipment: Walker (specify type) ADL Screening (condition at time of admission) Patient's cognitive ability adequate to safely complete daily activities?: Yes Is the patient deaf or have difficulty hearing?: No Does the patient have difficulty seeing, even when wearing glasses/contacts?: No Does the patient have difficulty concentrating, remembering, or making decisions?: No Patient able to express need for assistance with ADLs?: Yes Does the patient have difficulty dressing or bathing?: No Independently performs ADLs?: Yes (appropriate for developmental age) Does the patient have difficulty walking or climbing stairs?: Yes Weakness of Legs: Both Weakness of Arms/Hands: None  Permission Sought/Granted Permission sought to share information with : Family Supports Permission granted to share information with : Yes, Verbal Permission Granted  Share Information with NAME: wife [name]           Emotional Assessment         Alcohol / Substance Use: Not Applicable Psych Involvement: No (comment)  Admission diagnosis:  Somnolence [R40.0] Carbon dioxide retention [E87.2] COPD exacerbation ([place]) [J44.1] Patient Active Problem List   Diagnosis Date Noted  . Acute on chronic diastolic CHF (congestive heart failure) ([place]) 02/23/2019  . Acute renal failure with acute renal cortical necrosis superimposed on stage 4 chronic kidney disease ([place])   . Acute respiratory failure with hypoxia and hypercapnia ([place]) 02/22/2019  . SOB (shortness of breath) 02/22/2019  . Acute exacerbation of chronic obstructive pulmonary  disease (COPD) ([place]) 02/22/2019  . CKD (chronic kidney disease) stage 3, GFR 30-59 ml/min (HCC) 09/14/2018  . DM (diabetes mellitus) type II uncontrolled, periph vascular disorder  ([place]) 09/14/2018  . Chronic diastolic heart failure ([place]) 05/20/2018  . Acute diastolic heart failure ([place])   . Atrial fibrillation with RVR ([place])   . Carbon dioxide retention   . Pressure injury of skin 01/29/2017  . Hereditary and idiopathic peripheral neuropathy 01/08/2017  . Acute on chronic respiratory failure with hypoxia and hypercapnia ([place]) 02/16/2016  . Irritation of right eye 10/18/2015  . 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 Headache due to intracranial disease 10/13/2013  . Edema 12/17/2012  . 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 Status post craniotomy 10/12/2012  . CAD (coronary artery disease) of artery bypass graft 09/09/2012  . 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 Morbid obesity ([place]) 09/09/2012   PCP:  No primary care provider on file. Pharmacy:   [id number] - , [place] - [address] [place] [address] Phone: [phone] Fax: [phone]     Social Determinants of Health (SDOH) Interventions    Readmission Risk Interventions Readmission Risk Prevention Plan 02/24/2019  Transportation Screening Complete  PCP or Specialist Appt within 3-5 Days Not Complete  Not Complete comments see above  [place] or [place] Complete  Social Work Consult for [place] Planning/Counseling Complete  Palliative Care Screening Not Applicable  Medication Review Press photographer) Complete  Some recent data might be hidden

## 2019-02-26 NOTE — Progress Notes (Signed)
Occupational Therapy Treatment Patient Details Name: [name]. MRN: [id number] DOB: 1961-08-13 Today's Date: 02/26/2019    History of present illness Pt is a 58 y.o. male who presented to the ED with shortness of breath and somnolence. He was admitted with acute on chronic respiratory failure with hypoxia and hypercapnia and CHF exacerbation. He has a PMH significant for brain tumor s/p craniotomy, CAD s/p CABG with LIMA to LAD, fibromyalgia, gout, afib, CHF, COPD on 3 L O2, CKD stage III, DM-2, CVA with residual right sided weakness.    OT comments  Pt performing multiple ADL this AM while sitting up in bed, initially agreeable to OOB activity but then later declined. Pt requiring minA for UB ADL, setup/minguard for grooming ADL and maxA for LB ADL. Pt is distractible this session and requires frequent cues for redirection to task at hand. Concerned about needing to have BM prior to return home. Will continue per POC.   Follow Up Recommendations  Home health OT;Supervision/Assistance - 24 hour    Equipment Recommendations  None recommended by OT          Precautions / Restrictions Precautions Precautions: Fall Precaution Comments: O2 dependent Restrictions Weight Bearing Restrictions: No       Mobility Bed Mobility Overal bed mobility: Needs Assistance             General bed mobility comments: pt initially agreeable to OOB activity but then later declined                        ADL either performed or assessed with clinical judgement   ADL Overall ADL's : Needs assistance/impaired     Grooming: Wash/dry face;Set up;Bed level;Sitting           Upper Body Dressing : Minimal assistance;Sitting;Bed level Upper Body Dressing Details (indicate cue type and reason): doffing/donning new gown Lower Body Dressing: Maximal assistance;Total assistance;Bed level Lower Body Dressing Details (indicate cue type and reason): required assist to don socks  this session, reports he doesn't wear socks at home when asked how he performs this task at home               General ADL Comments: pt initially agreeable to OOB activity but later declined, performed multiple ADL at bed level               Cognition Arousal/Alertness: Awake/alert Behavior During Therapy: WFL for tasks assessed/performed Overall Cognitive Status: History of cognitive impairments - at baseline                                 General Comments: likely at baseline        Exercises     Shoulder Instructions       General Comments      Pertinent Vitals/ Pain       Pain Assessment: Faces Faces Pain Scale: Hurts little more Pain Location: back Pain Descriptors / Indicators: Discomfort Pain Intervention(s): Monitored during session;Repositioned  Home Living                                          Prior Functioning/Environment              Frequency  Min 2X/week        Progress Toward Goals  OT Goals(current goals can now be found in the care plan section)  Progress towards OT goals: Progressing toward goals  Acute Rehab OT Goals Patient Stated Goal: HOME OT Goal Formulation: With patient Time For Goal Achievement: 03/09/19 Potential to Achieve Goals: Good ADL Goals Pt Will Perform Grooming: with supervision;standing Pt Will Perform Upper Body Dressing: with modified independence;sitting Pt Will Perform Lower Body Dressing: with min assist;sit to/from stand Pt Will Transfer to Toilet: with supervision;ambulating;regular height toilet Pt Will Perform Toileting - Clothing Manipulation and hygiene: with supervision;sit to/from stand  Plan Discharge plan remains appropriate    Co-evaluation                 AM-PAC OT "6 Clicks" Daily Activity     Outcome Measure   Help from another person eating meals?: None Help from another person taking care of personal grooming?: A Little Help from another  person toileting, which includes using toliet, bedpan, or urinal?: A Lot Help from another person bathing (including washing, rinsing, drying)?: A Lot Help from another person to put on and taking off regular upper body clothing?: A Little Help from another person to put on and taking off regular lower body clothing?: A Lot 6 Click Score: 16    End of Session Equipment Utilized During Treatment: Oxygen  OT Visit Diagnosis: Other abnormalities of gait and mobility (R26.89);Muscle weakness (generalized) (M62.81);Unsteadiness on feet (R26.81)   Activity Tolerance Patient tolerated treatment well   Patient Left in bed;with call bell/phone within reach;with bed alarm set   Nurse Communication Mobility status        Time: 7096-2836 OT Time Calculation (min): 28 min  Charges: OT General Charges $OT Visit: 1 Visit OT Treatments $Self Care/Home Management : 23-37 mins  Lou Cal, [place] Pager [phone] Office [phone]    Raymondo Band 02/26/2019, 9:58 AM

## 2019-02-26 NOTE — Progress Notes (Signed)
Pt continues to refused BiPAP qhs. Pt encouraged to contact RT should he change his mind.

## 2019-02-26 NOTE — Consult Note (Signed)
   [facility] CM Inpatient Consult   02/26/2019  French Kendra. [birth year] [phone]  This writer was made aware of the active member in the CSNP of [employer] plan. Patient is currently active with [facility] for chronic disease management services.  Patient has been engaged by a [place] Management Coordinator,, Denton Brick. Received a call from [place] regarding patient's need for post hospital follow up with home health with [facility] as noted by inpatient [facility] team note for HHPT/OT.  She states the patient could benefit from [facility] as well for medication management follow up and assessment. Chart review reveals patient has been active with Dr. [name] in the Advanced HF program.  Patient admitted 02/22/2019 per MD notes as follows: 58 y.o. male with a hx of CAD s/p CABG, chronic diastolic CHFwho is being seen today for the evaluation ofacute on chronic CHF.  Patient has noted appointments with Micheline Rough, MD of the [facility] noted however is not listed in EPIC.  Plan:   Spoke with Juliann Pulse, Inpatient [facility] team member to make aware that [place] [facility] following in the [facility] CSNP and request for [facility] nursing to be added to follow, if possible. Also, requesting confirmation of the primary care providers are.   Of note, [facility] Care Management services does not replace or interfere with any services that are needed or arranged by inpatient [facility] care management team.  For additional questions or referrals please contact:  Natividad Brood, RN BSN [facility] Liaison  [phone] business mobile phone Toll free office [phone]  Fax number: [phone] [email or web address]

## 2019-02-26 NOTE — Progress Notes (Signed)
PROGRESS NOTE  [name]. FOY:[id number] DOB: Aug 26, 1960 DOA: 02/22/2019 PCP: No primary care provider on file.  Brief Summary:   CAD post CABG with LIMA to LAD,Fibromyalgia, gout, history of atrial fibrillation with RVR status post prior cardioversion,history of chronic diastolic CHF, COPD on 3 L of oxygen at home via nasal cannula, CKD stage III, diabetes mellitus type 2, history of nephrolithiasis,history of CVA presented to ED with shortness of breath and somnolence.  Patient was brought to ED via EMS after his wife called 911 for dyspnea.  Prior to this he had taken a pain pill.  In ED, ABG showed respiratory acidosis and patient was placed on 15 L nonrebreather.  He was admitted to stepdown unit for BiPAP, steroids and diuresis.  Cardiology was consulted for suspected CHF exacerbation.  He received a dose of Lasix, steroids, MDI inhaler.  Patient was admitted to stepdown unit. COVID-19 test negative    HPI/Recap of past 24 hours:  Poor historian He denies pain  Assessment/Plan: Principal Problem:   Acute on chronic respiratory failure with hypoxia and hypercapnia (HCC) Active Problems:   CAD (coronary artery disease) of artery bypass graft   Morbid obesity (HCC)   Carbon dioxide retention   CKD (chronic kidney disease) stage 3, GFR 30-59 ml/min (HCC)   DM (diabetes mellitus) type II uncontrolled, periph vascular disorder (HCC)   Acute exacerbation of chronic obstructive pulmonary disease (COPD) (HCC)   Acute on chronic diastolic CHF (congestive heart failure) (HCC)   Acute renal failure with acute renal cortical necrosis superimposed on stage 4 chronic kidney disease (HCC)    Acute on chronic respiratory failure with hypoxia and hypercapnia (HCC) -Likely precipitated due to acute on chronic COPD exacerbation, acute on chronic diastolic CHF exacerbation, OHS, likely somnolence from ?  Pain medication -Will likely benefit from outpatient sleep study and  possible CPAP/bipap, if he is compliant, I have discussed with wife over the phone, -Wean O2 as tolerated, currently 93% on 3 L (at baseline, weaned from 4 L this morning)  Active Problems:   Acute exacerbation of chronic obstructive pulmonary disease (COPD) ([place])  -Wheezing improving, patient was transitioned to oral prednisone, he refused prednisone as well as Pulmicort, states he gets tachycardia after steroids.  Patient had tolerated IV steroids -Continue Xopenex, Brovana, flutter valve.   -Continue O2, wean as tolerated to baseline 3 L at home    Acute on chronic diastolic CHF (congestive heart failure) (HCC) -Shortness of breath is improving,  Outpatient on torsemide 60 mg twice daily, troponins x3 neg -Negative balance of 9.6 L, currently on IV Lasix, cardiology following -2D echo showed EF of 55 to 60%, normal LV systolic function, mild LVH, mild LVE -follow Bmet  Paroxysmal atrial fibrillation - DCCV in 2019, remains in sinus rhythm -Continue Coreg, amiodarone, rate currently controlled -Continue Eliquis    CAD (coronary artery disease) of artery bypass graft, history of CABG with LIMA to LAD -Cardiology following, no acute ACS, troponins x3- -Continue Coreg, Imdur, amiodarone, Eliquis    Morbid obesity (HCC) -BMI 54.1, counseled patient on diet and weight control   Acute on CKD (chronic kidney disease) stage 3, GFR 30-59 ml/min (HCC) -Baseline creatinine 1.6-1.9, worsened likely due to decreased renal perfusion from acute heart failure, respiratory failure -Continue IV Lasix, was decreased yesterday, follow renal function, cr improving    DM (diabetes mellitus) type II uncontrolled, periph vascular disorder ([place]) -Uncontrolled with hyperglycemia likely due to IV steroids -Prednisone discontinued, CBGs more stable  today.   -Continue Lantus, sliding scale insulin moderate.    History of CVA status post acoustic neuroma surgery With right facial droop, right side  vision impairment, right side hearing impairment, no ipsilateral weakness in extremities per patient On topamax and narcotics chronically for neuropathic right facial pain Continue Eliquis  History of brain tumor status post craniotomy -Continue with Topamax  Chronic pain syndrome Patient had presented with acute hypercarbic respiratory failure, somnolence after taking pain pill at home, will decrease dose of Norco.  Recommended judicious use of narcotics and follow closely with PCP.  FTT: He reports sleeps in recliner , uses walker at home  Code Status: full  Family Communication: patient and  Wife over the phone  Disposition Plan: home with home health Needs outpatient sleep study, likely will benefit from nightly bipap  Per chart review, he "Refuses BiPAP. Not interested in sleep study" I have discussed with wife over the phone to encourage patient and wife to consider revisit outpatient sleep study and bipap, wife expressed understanding  Consultants:  cardiology  Procedures:  none  Antibiotics:  augmentin    Objective: BP 140/69 (BP Location: Left Arm)   Pulse 66   Temp 99 F (37.2 C) (Oral)   Resp 16   Ht 5\' 10"  (1.778 m)   Wt (!) 167.4 kg   SpO2 97%   BMI 52.95 kg/m   Intake/Output Summary (Last 24 hours) at 02/26/2019 1731 Last data filed at 02/26/2019 1645 Gross per 24 hour  Intake 1620 ml  Output 2225 ml  Net -605 ml   Filed Weights   02/24/19 0500 02/26/19 0533 02/26/19 0800  Weight: (!) 172 kg (!) 167.4 kg (!) 167.4 kg    Exam: Patient is examined daily including today on 02/26/2019, exams remain the same as of yesterday except that has changed    General:  NAD, chronically ill appearing, chronically impaired vision right eye, chronic right facial droop, obese   Cardiovascular: RRR  Respiratory: CTABL  Abdomen: Soft/ND/NT, positive BS  Musculoskeletal: No Edema  Neuro: alert, oriented   Data Reviewed: Basic Metabolic Panel: Recent  Labs  Lab 02/22/19 0324 02/23/19 0222 02/24/19 1213 02/25/19 0457 02/26/19 0558  NA 142 140 139 142 139  K 4.4 3.6 4.0 3.4* 3.5  CL 99 97* 93* 98 96*  CO2 34* 32 33* 32 34*  GLUCOSE 243* 281* 324* 156* 218*  BUN 30* 39* 67* 71* 71*  CREATININE 2.41* 2.60* 2.86* 2.77* 2.65*  CALCIUM 8.5* 8.8* 8.4* 8.5* 8.4*   Liver Function Tests: Recent Labs  Lab 02/22/19 0324 02/23/19 0222  AST 21 15  ALT 25 22  ALKPHOS 231* 201*  BILITOT 0.2* 0.8  PROT 8.1 8.2*  ALBUMIN 3.4* 3.1*   No results for input(s): LIPASE, AMYLASE in the last 168 hours. No results for input(s): AMMONIA in the last 168 hours. CBC: Recent Labs  Lab 02/22/19 0324 02/22/19 0934 02/23/19 0222  WBC 11.6* 11.9* 14.7*  NEUTROABS 8.7* 10.7*  --   HGB 12.5* 12.9* 12.7*  HCT 45.9 45.0 45.1  MCV 108.0* 108.2* 104.9*  PLT 223 194 272   Cardiac Enzymes:   No results for input(s): CKTOTAL, CKMB, CKMBINDEX, TROPONINI in the last 168 hours. BNP (last 3 results) Recent Labs    04/30/18 2044 10/09/18 1135 02/22/19 0324  BNP 557.2* 45.9 230.6*    ProBNP (last 3 results) No results for input(s): PROBNP in the last 8760 hours.  CBG: Recent Labs  Lab 02/25/19 1639 02/25/19  2056 02/26/19 0749 02/26/19 1156 02/26/19 1640  GLUCAP 245* 230* 211* 238* 217*    Recent Results (from the past 240 hour(s))  SARS Coronavirus 2 (CEPHEID- Performed in [facility] hospital lab), Hosp Order     Status: None   Collection Time: 02/22/19  3:24 AM   Specimen: Nasopharyngeal Swab  Result Value Ref Range Status   SARS Coronavirus 2 NEGATIVE NEGATIVE Final    Comment: (NOTE) If result is NEGATIVE SARS-CoV-2 target nucleic acids are NOT DETECTED. The SARS-CoV-2 RNA is generally detectable in upper and lower  respiratory specimens during the acute phase of infection. The lowest  concentration of SARS-CoV-2 viral copies this assay can detect is 250  copies / mL. A negative result does not preclude SARS-CoV-2 infection  and  should not be used as the sole basis for treatment or other  patient management decisions.  A negative result may occur with  improper specimen collection / handling, submission of specimen other  than nasopharyngeal swab, presence of viral mutation(s) within the  areas targeted by this assay, and inadequate number of viral copies  (<250 copies / mL). A negative result must be combined with clinical  observations, patient history, and epidemiological information. If result is POSITIVE SARS-CoV-2 target nucleic acids are DETECTED. The SARS-CoV-2 RNA is generally detectable in upper and lower  respiratory specimens dur ing the acute phase of infection.  Positive  results are indicative of active infection with SARS-CoV-2.  Clinical  correlation with patient history and other diagnostic information is  necessary to determine patient infection status.  Positive results do  not rule out bacterial infection or co-infection with other viruses. If result is PRESUMPTIVE POSTIVE SARS-CoV-2 nucleic acids MAY BE PRESENT.   A presumptive positive result was obtained on the submitted specimen  and confirmed on repeat testing.  While 2019 novel coronavirus  (SARS-CoV-2) nucleic acids may be present in the submitted sample  additional confirmatory testing may be necessary for epidemiological  and / or clinical management purposes  to differentiate between  SARS-CoV-2 and other Sarbecovirus currently known to infect humans.  If clinically indicated additional testing with an alternate test  methodology [id number]) is advised. The SARS-CoV-2 RNA is generally  detectable in upper and lower respiratory sp ecimens during the acute  phase of infection. The expected result is Negative. Fact Sheet for Patients:  StrictlyIdeas.no Fact Sheet for Healthcare Providers: BankingDealers.co.za This test is not yet approved or cleared by the [place] FDA and has been  authorized for detection and/or diagnosis of SARS-CoV-2 by FDA under an Emergency Use Authorization (EUA).  This EUA will remain in effect (meaning this test can be used) for the duration of the COVID-19 declaration under Section 564(b)(1) of the Act, 21 U.S.C. section 360bbb-3(b)(1), unless the authorization is terminated or revoked sooner. Performed at [facility], [place] [address], [place], [place] [address]   Urine culture     Status: Abnormal   Collection Time: 02/22/19  8:15 AM   Specimen: Urine, Clean Catch  Result Value Ref Range Status   Specimen Description   Final    URINE, CLEAN CATCH Performed at [facility], [place] [address], [place], [place] [address]    Special Requests   Final    NONE Performed at [facility], [place] [address], [place], [place] [address]    Culture 50,000 COLONIES/mL PROTEUS MIRABILIS (A)  Final   Report Status 02/24/2019 FINAL  Final   Organism ID, Bacteria  PROTEUS MIRABILIS (A)  Final      Susceptibility   Proteus mirabilis - MIC*    AMPICILLIN <=2 SENSITIVE Sensitive     CEFAZOLIN <=4 SENSITIVE Sensitive     CEFTRIAXONE <=1 SENSITIVE Sensitive     CIPROFLOXACIN <=0.25 SENSITIVE Sensitive     GENTAMICIN <=1 SENSITIVE Sensitive     IMIPENEM 2 SENSITIVE Sensitive     NITROFURANTOIN 128 RESISTANT Resistant     TRIMETH/SULFA <=20 SENSITIVE Sensitive     AMPICILLIN/SULBACTAM <=2 SENSITIVE Sensitive     PIP/TAZO <=4 SENSITIVE Sensitive     * 50,000 COLONIES/mL PROTEUS MIRABILIS  MRSA PCR Screening     Status: None   Collection Time: 02/22/19 10:03 AM   Specimen: Nasopharyngeal  Result Value Ref Range Status   MRSA by PCR NEGATIVE NEGATIVE Final    Comment:        The GeneXpert MRSA Assay (FDA approved for NASAL specimens only), is one component of a comprehensive MRSA colonization surveillance program. It is not intended to diagnose MRSA infection nor to guide or monitor treatment for  MRSA infections. Performed at [facility], [place] [address], [place], [place] [address]      Studies: No results found.  Scheduled Meds: . amiodarone  100 mg Oral Daily  . amoxicillin-clavulanate  1 tablet Oral Q12H  . apixaban  5 mg Oral BID  . arformoterol  15 mcg Nebulization BID  . carvedilol  12.5 mg Oral BID WC  . chlorhexidine  15 mL Mouth Rinse BID  . Chlorhexidine Gluconate Cloth  6 each Topical Daily  . feeding supplement (PRO-STAT SUGAR FREE 64)  30 mL Oral TID BM  . furosemide  60 mg Oral BID  . guaiFENesin  1,200 mg Oral BID  . insulin aspart  0-15 Units Subcutaneous TID WC  . insulin aspart  0-5 Units Subcutaneous QHS  . insulin glargine  40 Units Subcutaneous QHS  . isosorbide mononitrate  30 mg Oral Daily  . levalbuterol  0.63 mg Nebulization BID  . mouth rinse  15 mL Mouth Rinse q12n4p  . multivitamin with minerals  1 tablet Oral Daily  . Ensure Max Protein  11 oz Oral Daily  . sodium chloride flush  3 mL Intravenous Q12H  . topiramate  100 mg Oral QHS    Continuous Infusions: . sodium chloride       Time spent: 39mins I have personally reviewed and interpreted on  02/26/2019 daily labs, tele strips, imagings as discussed above under date review session and assessment and plans.  I reviewed all nursing notes, pharmacy notes, consultant notes,  vitals, pertinent old records  I have discussed plan of care as described above with RN , patient and family on 02/26/2019   Florencia Reasons MD, PhD  [facility] Pager [phone]. If 7PM-7AM, please contact night-coverage at [email or web address], password [facility] 02/26/2019, 5:31 PM  LOS: 4 days

## 2019-02-26 NOTE — Progress Notes (Addendum)
Progress Note  Patient Name: [name]. Date of Encounter: 02/26/2019  Primary Cardiologist: [name], MD AHF  Subjective   Feels better, seems mildly confused.  No chest pain and no SOB   Inpatient Medications    Scheduled Meds: . amiodarone  100 mg Oral Daily  . amoxicillin-clavulanate  1 tablet Oral Q12H  . apixaban  5 mg Oral BID  . arformoterol  15 mcg Nebulization BID  . carvedilol  12.5 mg Oral BID WC  . chlorhexidine  15 mL Mouth Rinse BID  . Chlorhexidine Gluconate Cloth  6 each Topical Daily  . feeding supplement (PRO-STAT SUGAR FREE 64)  30 mL Oral TID BM  . furosemide  60 mg Oral BID  . guaiFENesin  1,200 mg Oral BID  . insulin aspart  0-15 Units Subcutaneous TID WC  . insulin aspart  0-5 Units Subcutaneous QHS  . insulin glargine  40 Units Subcutaneous QHS  . isosorbide mononitrate  30 mg Oral Daily  . levalbuterol  0.63 mg Nebulization BID  . mouth rinse  15 mL Mouth Rinse q12n4p  . multivitamin with minerals  1 tablet Oral Daily  . Ensure Max Protein  11 oz Oral Daily  . sodium chloride flush  3 mL Intravenous Q12H  . topiramate  100 mg Oral QHS   Continuous Infusions: . sodium chloride     PRN Meds: sodium chloride, acetaminophen **OR** acetaminophen, artificial tears, hydrALAZINE, HYDROcodone-acetaminophen, ipratropium, ondansetron **OR** ondansetron (ZOFRAN) IV, polyethylene glycol, polyvinyl alcohol, sodium chloride flush   Vital Signs    Vitals:   02/26/19 0524 02/26/19 0533 02/26/19 0727 02/26/19 0800  BP: 128/64     Pulse: 71     Resp: 18     Temp: 98.2 F (36.8 C)     TempSrc: Oral     SpO2: 96%  95%   Weight:  (!) 167.4 kg  (!) 167.4 kg  Height:        Intake/Output Summary (Last 24 hours) at 02/26/2019 1003 Last data filed at 02/26/2019 0700 Gross per 24 hour  Intake 1200 ml  Output 1450 ml  Net -250 ml   Last 3 Weights 02/26/2019 02/26/2019 02/24/2019  Weight (lbs) 369 lb 0.8 oz 369 lb 0.8 oz 379 lb 3.1 oz  Weight  (kg) 167.4 kg 167.4 kg 172 kg      Telemetry    SR to a fib  - Personally Reviewed  ECG    None in epic since 10/09/18 - Personally Reviewed  Physical Exam   GEN: No acute distress.   Neck: No JVD to flat Cardiac: RRR, no murmurs, rubs, or gallops.  Respiratory: Clear to auscultation bilaterally. Though diminished  GI: obese, Soft, nontender, non-distended  MS: tr edema improved; No deformity. Neuro:  Nonfocal  Psych: Normal affect   Labs    High Sensitivity Troponin:   Recent Labs  Lab 02/22/19 0324 02/22/19 0524  TROPONINIHS 14 15.0      Cardiac EnzymesNo results for input(s): TROPONINI in the last 168 hours. No results for input(s): TROPIPOC in the last 168 hours.   Chemistry Recent Labs  Lab 02/22/19 0324 02/23/19 0222 02/24/19 1213 02/25/19 0457 02/26/19 0558  NA 142 140 139 142 139  K 4.4 3.6 4.0 3.4* 3.5  CL 99 97* 93* 98 96*  CO2 34* 32 33* 32 34*  GLUCOSE 243* 281* 324* 156* 218*  BUN 30* 39* 67* 71* 71*  CREATININE 2.41* 2.60* 2.86* 2.77* 2.65*  CALCIUM  8.5* 8.8* 8.4* 8.5* 8.4*  PROT 8.1 8.2*  --   --   --   ALBUMIN 3.4* 3.1*  --   --   --   AST 21 15  --   --   --   ALT 25 22  --   --   --   ALKPHOS 231* 201*  --   --   --   BILITOT 0.2* 0.8  --   --   --   GFRNONAA 29* 26* 23* 24* 25*  GFRAA 33* 30* 27* 28* 29*  ANIONGAP 9 11 13 12 9      Hematology Recent Labs  Lab 02/22/19 0324 02/22/19 0934 02/23/19 0222  WBC 11.6* 11.9* 14.7*  RBC 4.25 4.16* 4.30  HGB 12.5* 12.9* 12.7*  HCT 45.9 45.0 45.1  MCV 108.0* 108.2* 104.9*  MCH 29.4 31.0 29.5  MCHC 27.2* 28.7* 28.2*  RDW 12.7 12.8 12.6  PLT 223 194 272    BNP Recent Labs  Lab 02/22/19 0324  BNP 230.6*     DDimer No results for input(s): DDIMER in the last 168 hours.   Radiology    No results found.  Cardiac Studies   ECHO: 02/22/19   1. The left ventricle has normal systolic function, with an ejection fraction of 55-60%. The cavity size was mildly dilated. There is  mildly increased left ventricular wall thickness. 2. Left atrial size was mildly dilated. 3. Extremely limited; definity used; normal LV systolic function; focal wall motion abnormality cannot be excluded; mild LVH; mild LVE.  Patient Profile     58 y.o. male with ahx of CAD s/p CABG, chronic diastolic CHF who is being seen for the evaluation ofacute on chronic CHF.  Assessment & Plan    ACUTE ON CHRONIC DIASTOLIC HF: Negative 9,8 liters since admit. Wt down from 172 Kg at pk to 167.4 Kg  now on lasix 60 BID po at home was on demadex 60 mg BID.   Change back to demadex at discharge   ACUTE RESPIRATORY FAILURE: Hypoventilation obesity syndrome. Improved.     PAF: Maintaining NSR on amiodarone. Continue DOAC.  No change in therapy.   did have some a fib yesterday   AKI: Creat slightly lower today.   WNU:UVOZ is being managed in the context of treating his CHF 128/64      For questions or updates, please contact [facility] Please consult [email or web address] for contact info under        Signed, Cecilie Kicks, NP  02/26/2019, 10:03 AM    History and all data above reviewed.  Patient examined.  I agree with the findings as above.  He is much more alert and less SOB than he was a few days ago.    The patient exam reveals COR:RRR  ,  Lungs: Clear  ,  Abd: Positive bowel sounds, no rebound no guarding, Ext Mild leg edema  .  All available labs, radiology testing, previous records reviewed. Agree with documented assessment and plan.   Acute on chronic diastolic HF:  He is improved and could go home on Demadex instead of Lasix at discharge.  However, he will need arrangements to have his blood check no later than 5 - 7 days after discharge for his creat.  Discussed low salt and fluid management.    [name]  11:34 AM  02/26/2019

## 2019-02-27 ENCOUNTER — Other Ambulatory Visit: Payer: Self-pay

## 2019-02-27 DIAGNOSIS — E119 Type 2 diabetes mellitus without complications: Secondary | ICD-10-CM

## 2019-02-27 DIAGNOSIS — Z6841 Body Mass Index (BMI) 40.0 and over, adult: Secondary | ICD-10-CM

## 2019-02-27 DIAGNOSIS — Z794 Long term (current) use of insulin: Secondary | ICD-10-CM

## 2019-02-27 LAB — CBC WITH DIFFERENTIAL/PLATELET
Abs Immature Granulocytes: 0.1 10*3/uL — ABNORMAL HIGH (ref 0.00–0.07)
Basophils Absolute: 0 10*3/uL (ref 0.0–0.1)
Basophils Relative: 0 %
Eosinophils Absolute: 0.5 10*3/uL (ref 0.0–0.5)
Eosinophils Relative: 4 %
HCT: 48.3 % (ref 39.0–52.0)
Hemoglobin: 13.9 g/dL (ref 13.0–17.0)
Immature Granulocytes: 1 %
Lymphocytes Relative: 12 %
Lymphs Abs: 1.4 10*3/uL (ref 0.7–4.0)
MCH: 30.3 pg (ref 26.0–34.0)
MCHC: 28.8 g/dL — ABNORMAL LOW (ref 30.0–36.0)
MCV: 105.5 fL — ABNORMAL HIGH (ref 80.0–100.0)
Monocytes Absolute: 1.5 10*3/uL — ABNORMAL HIGH (ref 0.1–1.0)
Monocytes Relative: 12 %
Neutro Abs: 8.8 10*3/uL — ABNORMAL HIGH (ref 1.7–7.7)
Neutrophils Relative %: 71 %
Platelets: 202 10*3/uL (ref 150–400)
RBC: 4.58 MIL/uL (ref 4.22–5.81)
RDW: 12.5 % (ref 11.5–15.5)
WBC: 12.4 10*3/uL — ABNORMAL HIGH (ref 4.0–10.5)
nRBC: 0 % (ref 0.0–0.2)

## 2019-02-27 LAB — COMPREHENSIVE METABOLIC PANEL
ALT: 25 U/L (ref 0–44)
AST: 15 U/L (ref 15–41)
Albumin: 3.1 g/dL — ABNORMAL LOW (ref 3.5–5.0)
Alkaline Phosphatase: 163 U/L — ABNORMAL HIGH (ref 38–126)
Anion gap: 12 (ref 5–15)
BUN: 62 mg/dL — ABNORMAL HIGH (ref 6–20)
CO2: 34 mmol/L — ABNORMAL HIGH (ref 22–32)
Calcium: 8.5 mg/dL — ABNORMAL LOW (ref 8.9–10.3)
Chloride: 97 mmol/L — ABNORMAL LOW (ref 98–111)
Creatinine, Ser: 2.26 mg/dL — ABNORMAL HIGH (ref 0.61–1.24)
GFR calc Af Amer: 36 mL/min — ABNORMAL LOW (ref >60)
GFR calc non Af Amer: 31 mL/min — ABNORMAL LOW (ref >60)
Glucose, Bld: 186 mg/dL — ABNORMAL HIGH (ref 70–99)
Potassium: 3.7 mmol/L (ref 3.5–5.1)
Sodium: 143 mmol/L (ref 135–145)
Total Bilirubin: 0.4 mg/dL (ref 0.3–1.2)
Total Protein: 7.5 g/dL (ref 6.5–8.1)

## 2019-02-27 LAB — GLUCOSE, CAPILLARY
Glucose-Capillary: 175 mg/dL — ABNORMAL HIGH (ref 70–99)
Glucose-Capillary: 229 mg/dL — ABNORMAL HIGH (ref 70–99)

## 2019-02-27 MED ORDER — GUAIFENESIN ER 600 MG PO TB12: 1200.0000 mg | ORAL_TABLET | Freq: Two times a day (BID) | ORAL | 0 refills | Status: DC

## 2019-02-27 NOTE — Patient Outreach (Signed)
  [facility]) Care Management Chronic Special Needs Program   02/27/2019  Name: [name]., DOB: Mar 11, 1961  MRN: [id number]  The client was discussed with interdisciplinary care team. The following issues were discussed:  Client's needs, Changes in health status, Care Plan, Coordination of care, Care transitions and Issues/barriers to care  Participants present:  [place] Management.   Recommendations:  Add Home health nurse to other home health services, if client and doctor in agreement.   Plan: RNCM will continue to follow as client's C-SNP Chronic care management coordinator.  Thea Silversmith, RN, MSN, [place] [phone]

## 2019-02-27 NOTE — Progress Notes (Signed)
Pt discharged home today per Dr. Erlinda Hong. Pt's IV site D/C'd and WDL Pt's VSS. Discussed discharge paperwork, prescriptions and follow up appointments with patient's wife over the telephone. Paperwork given to patient to give to wife. Pt left floor via WC in stable condition accompanied by NT.

## 2019-02-27 NOTE — Progress Notes (Signed)
Progress Note  Patient Name: [name]. Date of Encounter: 02/27/2019  Primary Cardiologist:   [name], MD   Subjective   No pain.  No SOB.  Ready to go home.    Inpatient Medications    Scheduled Meds: . amiodarone  100 mg Oral Daily  . amoxicillin-clavulanate  1 tablet Oral Q12H  . apixaban  5 mg Oral BID  . arformoterol  15 mcg Nebulization BID  . carvedilol  12.5 mg Oral BID WC  . chlorhexidine  15 mL Mouth Rinse BID  . feeding supplement (PRO-STAT SUGAR FREE 64)  30 mL Oral TID BM  . furosemide  60 mg Oral BID  . guaiFENesin  1,200 mg Oral BID  . insulin aspart  0-15 Units Subcutaneous TID WC  . insulin aspart  0-5 Units Subcutaneous QHS  . insulin glargine  40 Units Subcutaneous QHS  . isosorbide mononitrate  30 mg Oral Daily  . levalbuterol  0.63 mg Nebulization BID  . mouth rinse  15 mL Mouth Rinse q12n4p  . multivitamin with minerals  1 tablet Oral Daily  . Ensure Max Protein  11 oz Oral Daily  . sodium chloride flush  3 mL Intravenous Q12H  . topiramate  100 mg Oral QHS   Continuous Infusions: . sodium chloride     PRN Meds: sodium chloride, acetaminophen **OR** acetaminophen, artificial tears, hydrALAZINE, HYDROcodone-acetaminophen, ipratropium, ondansetron **OR** ondansetron (ZOFRAN) IV, polyethylene glycol, polyvinyl alcohol, sodium chloride flush   Vital Signs    Vitals:   02/26/19 2004 02/26/19 2129 02/27/19 0550 02/27/19 0736  BP:  131/72 (!) 169/71   Pulse:  60 60   Resp:  18 16   Temp:  98.8 F (37.1 C) 98.3 F (36.8 C)   TempSrc:  Oral Oral   SpO2: 97% 95% 98% 95%  Weight:   (!) 167.1 kg   Height:        Intake/Output Summary (Last 24 hours) at 02/27/2019 1249 Last data filed at 02/27/2019 1124 Gross per 24 hour  Intake 1083 ml  Output 3225 ml  Net -2142 ml   Filed Weights   02/26/19 0533 02/26/19 0800 02/27/19 0550  Weight: (!) 167.4 kg (!) 167.4 kg (!) 167.1 kg    Telemetry    NSR - Personally Reviewed   ECG    NA - Personally Reviewed  Physical Exam   GEN: No acute distress.   Neck: No  JVD Cardiac: RRR, no murmurs, rubs, or gallops.  Respiratory: Clear  to auscultation bilaterally. GI: Soft, nontender, non-distended  MS: No  edema; No deformity. Neuro:  Nonfocal  Psych: Normal affect   Labs    Chemistry Recent Labs  Lab 02/22/19 0324 02/23/19 0222  02/25/19 0457 02/26/19 0558 02/27/19 0518  NA 142 140   < > 142 139 143  K 4.4 3.6   < > 3.4* 3.5 3.7  CL 99 97*   < > 98 96* 97*  CO2 34* 32   < > 32 34* 34*  GLUCOSE 243* 281*   < > 156* 218* 186*  BUN 30* 39*   < > 71* 71* 62*  CREATININE 2.41* 2.60*   < > 2.77* 2.65* 2.26*  CALCIUM 8.5* 8.8*   < > 8.5* 8.4* 8.5*  PROT 8.1 8.2*  --   --   --  7.5  ALBUMIN 3.4* 3.1*  --   --   --  3.1*  AST 21 15  --   --   --  15  ALT 25 22  --   --   --  25  ALKPHOS 231* 201*  --   --   --  163*  BILITOT 0.2* 0.8  --   --   --  0.4  GFRNONAA 29* 26*   < > 24* 25* 31*  GFRAA 33* 30*   < > 28* 29* 36*  ANIONGAP 9 11   < > 12 9 12    < > = values in this interval not displayed.     Hematology Recent Labs  Lab 02/22/19 0934 02/23/19 0222 02/27/19 0518  WBC 11.9* 14.7* 12.4*  RBC 4.16* 4.30 4.58  HGB 12.9* 12.7* 13.9  HCT 45.0 45.1 48.3  MCV 108.2* 104.9* 105.5*  MCH 31.0 29.5 30.3  MCHC 28.7* 28.2* 28.8*  RDW 12.8 12.6 12.5  PLT 194 272 202    Cardiac EnzymesNo results for input(s): TROPONINI in the last 168 hours. No results for input(s): TROPIPOC in the last 168 hours.   BNP Recent Labs  Lab 02/22/19 0324  BNP 230.6*     DDimer No results for input(s): DDIMER in the last 168 hours.   Radiology    No results found.  Cardiac Studies    ECHO: 02/22/19   1. The left ventricle has normal systolic function, with an ejection fraction of 55-60%. The cavity size was mildly dilated. There is mildly increased left ventricular wall thickness. 2. Left atrial size was mildly dilated. 3. Extremely limited;  definity used; normal LV systolic function; focal wall motion abnormality cannot be excluded; mild LVH; mild LVE.  Patient Profile     58 y.o. male with ahx of CAD s/p CABG, chronic diastolic CHF who is being seen for the evaluation ofacute on chronic CHF.  Assessment & Plan    ACUTE ON CHRONIC DIASTOLIC HF: [id number] liters since admit. discharge back on Demadex 60 mg BID.    PAF: Had episodes of fib this admit but for the most part NSR.  Continue current therapy.  AKI: Creat continues to improve.   HTN:BP has been labile.  Continue meds as listed.    For questions or updates, please contact [place] Please consult [email or web address] for contact info under Cardiology/STEMI.   Signed, Minus Breeding, MD  02/27/2019, 12:49 PM

## 2019-02-27 NOTE — Progress Notes (Signed)
PT Cancellation Note  Patient Details Name: [name]. MRN: [id number] DOB: [birth year]   Cancelled Treatment:    Reason Eval/Treat Not Completed: Patient declined, no reason specified; reports going home this afternoon and doesn't want his catheter to come off till getting ready to go home.  Also reports does not want HHPT due to had big bill last time and they only came out one time. Will check back if not d/c.    Reginia Naas 02/27/2019, 4:27 PM  Magda Kiel, [place] [phone] 02/27/2019

## 2019-02-27 NOTE — TOC Transition Note (Signed)
Transition of Care [facility]) - CM/SW Discharge Note   Patient Details  Name: [name]. MRN: [id number] Date of Birth: 10-24-1960  [facility] CM/SW Contact:  Dessa Phi, RN Phone Number: 02/27/2019, 11:51 AM   Clinical Narrative: Spoke to patient/spouse yesterday about d/c plans-they are in agreement to d/c home w/HHC,already has home 02-has travel tank.Declines CPAP. [facility] following. Has own transportation home. No further CM needs.        Barriers to Discharge: No Barriers Identified   Patient Goals and CMS Choice Patient states their goals for this hospitalization and ongoing recovery are:: go home CMS Medicare.gov Compare Post Acute Care list provided to:: Patient Choice offered to / list presented to : Patient  Discharge Placement                       Discharge Plan and Services In-house Referral: Clinical Social Work Discharge Planning Services: CM Consult Post Acute Care Choice: Home Health                    HH Arranged: RN, PT, OT   Date HH Agency Contacted: 02/27/19 Time [place]: 1150 Representative spoke with at [place]: [place] ([place]) Interventions     Readmission Risk Interventions Readmission Risk Prevention Plan 02/24/2019  Transportation Screening Complete  PCP or Specialist Appt within 3-5 Days Not Complete  Not Complete comments see above  [place] or [place] Complete  Social Work Consult for [place] Planning/Counseling Complete  Palliative Care Screening Not Applicable  Medication Review Press photographer) Complete  Some recent data might be hidden

## 2019-02-27 NOTE — Progress Notes (Signed)
  Speech Language Pathology Treatment: Dysphagia  Patient Details Name: [name]. MRN: [id number] DOB: 1960/12/12 Today's Date: 02/27/2019 Time: 7185-5015 SLP Time Calculation (min) (ACUTE ONLY): 18 min  Assessment / Plan / Recommendation Clinical Impression  Patient observed with lunch tray today. He is tolerating Dys 3, thin liquid diet with minimal drooling on right side (facial droop). Patient had no changes in RR, no throat clearing or coughing and a clear vocal quality throughout meal. Recommend continuation of current diet. ST to follow up for diet tolerance and aspiration precaution training.    HPI HPI: Patient is a 58 y.o. male with PMH: brain tumor s/p craniotom, CAD s/p CABG with LIMA to LAD, fibromyalgia, gout, afib, CHF, COPD on 3 L O2, CKD stage III, DM-2, CVA with residual right sided weakness. He presented to hospital with c/o SOB and somnolence and was confused. ABG done in ED which showed respiratory acidosis, CXR showed diffuse patchy opacifications of lungs consistent with probable pulmonary edema. Paitient was placed on 15 L NRB .      SLP Plan  Continue with current plan of care       Recommendations  Diet recommendations: Dysphagia 3 (mechanical soft);Thin liquid Liquids provided via: Cup;Straw Medication Administration: Crushed with puree Supervision: Patient able to self feed;Intermittent supervision to cue for compensatory strategies Compensations: Minimize environmental distractions;Lingual sweep for clearance of pocketing Postural Changes and/or Swallow Maneuvers: Seated upright 90 degrees                Oral Care Recommendations: Oral care BID Plan: Continue with current plan of care       [place], MA, CCC-SLP 02/27/2019 12:59 PM

## 2019-02-27 NOTE — Discharge Summary (Signed)
Discharge Summary  [name]. QIW:[id number] DOB: 1961-01-22  PCP: No primary care provider on file.  Admit date: 02/22/2019 Discharge date: 02/27/2019  Time spent: 76mns, more than 50% time spent on coordination of care.  Recommendations for Outpatient Follow-up:  1. F/u with PCP within a week  for hospital discharge follow up, repeat cbc/bmp at follow up. pcp to arrange sleep study 2. F/u with cardiology for afib and heart failure management 3. Home health arranged RN/PT/OT  Discharge Diagnoses:  Active Hospital Problems   Diagnosis Date Noted  . Acute on chronic respiratory failure with hypoxia and hypercapnia ([facility] 02/16/2016  . Acute on chronic diastolic CHF (congestive heart failure) ([facility] 02/23/2019  . Acute renal failure with acute renal cortical necrosis superimposed on stage 4 chronic kidney disease ([place]   . Acute exacerbation of chronic obstructive pulmonary disease (COPD) ([facility] 02/22/2019  . DM (diabetes mellitus) type II uncontrolled, periph vascular disorder ([facility] 09/14/2018  . CKD (chronic kidney disease) stage 3, GFR 30-59 ml/min (HCC) 09/14/2018  . Carbon dioxide retention   . Obesity hypoventilation syndrome ([facility] 09/09/2012  . CAD (coronary artery disease) of artery bypass graft 09/09/2012    Resolved Hospital Problems  No resolved problems to display.    Discharge Condition: stable  Diet recommendation: heart healthy/carb modified/fluids restriction to 1500cc per day  Diet recommendations: Dysphagia 3 (mechanical soft);Thin liquid Liquids provided via: Cup;Straw Medication Administration: Crushed with puree Supervision: Patient able to self feed;Intermittent supervision to cue for compensatory strategies Compensations: Minimize environmental distractions;Lingual sweep for clearance of pocketing Postural Changes and/or Swallow Maneuvers: Seated upright 90 degrees  Filed Weights   02/26/19 0533 02/26/19 0800 02/27/19 0550  Weight: (!) 167.4  kg (!) 167.4 kg (!) 167.1 kg    History of present illness: (per admitting MD Dr SAlfredia Ferguson  PCP: KCaren Macadam MD   Patient coming from: Home  Chief Complaint: SOB and Somnolence   HPI: [name] is a 58y.o. male with medical history significant for but not limited to morbid obesity, history of brain tumor status post craniotomy, CAD post CABG with LIMA to LAD, Fibromyalgia, gout, history of atrial fibrillation with RVR status post prior cardioversion, history of chronic diastolic CHF, COPD on 3 L of oxygen at home via nasal cannula, CKD stage III, diabetes mellitus type 2, history of nephrolithiasis, history of CVA and other comorbidities who presents with a chief complaint of shortness of breath and somnolence.  Of note the patient is somnolent and is very confused at this time but is able to answer some orientation questions and knows his name and knows who the president is but does not know the year or the month.  History is limited from the patient due to his confusion and most of it was taken from the chart as well as EDP and nursing.  Patient was brought to the hospital via EMS after his wife called 911 for shortness of breath and somnolence.  In the morning around 1 AM he called out to his Wife that he started feeling Short of breath.  Because shortness of breath did not fo away wife called EMS. Prior to this he had taken a pain pill. Currently does not tell me that he is short of breath or Chest Pain now and he was acting confused and not following directions appropriately.  He was worked up in the ED and ABG done showed a respiratory acidosis which was likely the cause of the patient's somnolence.  TRH was called admit this patient for shortness of breath and somnolence and he was admitted to the stepdown unit for BiPAP, diuresis as well as steroids.  Cardiology was consulted for suspected CHF exacerbation.  ED Course: In the ED patient had basic blood work done as  well as a chest x-ray and a coronavirus testing which was negative.  Patient was placed on 15 L nonrebreather and then transferred to stepdown unit on 6.  He was given a dose of Lasix, steroids, and an MDI inhaler in the ED.  ABG done showed a respiratory acidosis.  Hospital Course:  Principal Problem:   Acute on chronic respiratory failure with hypoxia and hypercapnia (HCC) Active Problems:   CAD (coronary artery disease) of artery bypass graft   Obesity hypoventilation syndrome (HCC)   Carbon dioxide retention   CKD (chronic kidney disease) stage 3, GFR 30-59 ml/min (HCC)   DM (diabetes mellitus) type II uncontrolled, periph vascular disorder (HCC)   Acute exacerbation of chronic obstructive pulmonary disease (COPD) (HCC)   Acute on chronic diastolic CHF (congestive heart failure) (HCC)   Acute renal failure with acute renal cortical necrosis superimposed on stage 4 chronic kidney disease (HCC)   Acute on chronic respiratory failure with hypoxia and hypercapnia (HCC), on home 02 3-4 liters chronically -Likely precipitated due to acute on chronic COPD exacerbation, acute on chronic diastolic CHF exacerbation, OHS, likely somnolence from ? Pain medication -Will likely benefit from outpatient sleep study and possible CPAP/bipap, if he is compliant, I have discussed with wife over the phone, I have also discussed this with patient, he expressed understanding -Wean O2 as tolerated, currently 93% on 3 L(at baseline, weaned from 4 L this morning)  Active Problems:   Acute on chronic diastolic CHF (congestive heart failure) (HCC) -Shortness of breath is improving,Outpatient on torsemide 60 mg twice daily, troponins x3 neg -he responded to iv lasix with Negative balance of 11 L,  -2D echo showed EF of 55 to 60%, normal LV systolic function, mild LVH, mild LVE -cr improved, he is feeling better, he wants to go home , cardiology cleared him to go home and continue demadex 70m bid -he  is to follow up with cardiology   Paroxysmal atrial fibrillation - DCCV in 2019, remains in sinus rhythm -Continue Coreg, amiodarone, rate currently controlled -Continue Eliquis  CAD (coronary artery disease) of artery bypass graft, history of CABG with LIMA to LAD -Cardiology following, no acute ACS, troponins x3- -Continue Coreg, Imdur, amiodarone, Eliquis  Acute exacerbation of chronic obstructive pulmonary disease (COPD) ([facility]  -Wheezing resolved,patient was transitioned to oral prednisone, he refused prednisone as well as Pulmicort, states he gets tachycardia after steroids. Patient had tolerated IV steroids -Continue Xopenex, Brovana, flutter valve. -Continue O2, wean as tolerated to baseline 3 L at home - he is given augmentin in the hospital   Morbid obesity ([facility] -BMI 54.1, counseled patient on diet and weight control   Acute on CKD (chronic kidney disease) stage 3, GFR 30-59 ml/min (HCC) -Baseline creatinine 1.6-1.9, worsened likely due to decreased renal perfusion from acute heart failure, respiratory failure -cr peaked at 2.86, cr improved at discharge , cr down to 2.26 at discharge -repeat bmp in one week at hospital discharge follow up  DM (diabetes mellitus) type II uncontrolled, periph vascular disorder ([facility],  -Uncontrolled with hyperglycemia likely due to IV steroids -Prednisone discontinued, CBGs more stable today. -Continue Lantus, sliding scale insulin moderate. -f/u with pcp  History of CVA status post acoustic  neuroma surgery With right facial droop, right side vision impairment, right side hearing impairment, no ipsilateral weakness in extremities per patient On topamax and narcotics chronically for neuropathic right facial pain Continue Eliquis  History of brain tumor status post craniotomy -Continue with Topamax  Chronic pain syndrome Patient had presented with acute hypercarbic respiratory failure, somnolence after taking  pain pill at home, will decrease dose of Norco. Recommended judicious use of narcotics and follow closely with PCP.  FTT: He reports sleeps in recliner , uses walker at home  Code Status: full  Family Communication: patient and  Wife over the phone  Disposition Plan: home with home health Needs outpatient sleep study, likely will benefit from nightly bipap  Per chart review, he "Refuses BiPAP. Not interested in sleep study" I have discussed with wife over the phone to encourage patient and wife to consider revisit outpatient sleep study and bipap, wife expressed understanding I have also discussed with patient regarding outpatient sleep study , he expressed understanding and agreement.  Consultants:  cardiology  Procedures:  none  Antibiotics:  augmentin    Discharge Exam: BP 136/62 (BP Location: Left Arm)   Pulse 63   Temp 98.3 F (36.8 C) (Oral)   Resp (!) 22   Ht _0  (1.778 m)   Wt (!) 167.1 kg   SpO2 98%   BMI 52.86 kg/m    General:  NAD, chronically ill appearing, chronically impaired vision right eye, chronic right facial droop, obese   Cardiovascular: RRR  Respiratory: CTABL  Abdomen: Soft/ND/NT, positive BS  Musculoskeletal: No Edema  Neuro: alert, oriented    Discharge Instructions You were cared for by a hospitalist during your hospital stay. If you have any questions about your discharge medications or the care you received while you were in the hospital after you are discharged, you can call the unit and asked to speak with the hospitalist on call if the hospitalist that took care of you is not available. Once you are discharged, your primary care physician will handle any further medical issues. Please note that NO REFILLS for any discharge medications will be authorized once you are discharged, as it is imperative that you return to your primary care physician (or establish a relationship with a primary care physician if you do not  have one) for your aftercare needs so that they can reassess your need for medications and monitor your lab values.  Discharge Instructions    Diet - low sodium heart healthy   Complete by: As directed    Carb modified Fluids restriction 1500cc per day   Increase activity slowly   Complete by: As directed      Allergies as of 02/27/2019      Reactions   Morphine And Related Anaphylaxis   "makes me stop breathing"   Novocain [procaine] Anaphylaxis   Carbamazepine    shaking   Gabapentin Other (See Comments)   Blood in urine   Ivp Dye [iodinated Diagnostic Agents]    Passing out   Other    Steroids makes hearts race      Medication List    STOP taking these medications   sucralfate 1 g tablet Commonly known as: Carafate     TAKE these medications   amiodarone 100 MG tablet Commonly known as: PACERONE Take 1 tablet (100 mg total) by mouth daily.   apixaban 5 MG Tabs tablet Commonly known as: Eliquis Take 1 tablet (5 mg total) by mouth 2 (two) times daily.  What changed: when to take this   artificial tears Oint ophthalmic ointment Place 1 application into both eyes at bedtime as needed for dry eyes.   carvedilol 12.5 MG tablet Commonly known as: COREG Take 1 tablet (12.5 mg total) by mouth 2 (two) times daily with a meal.   DuoDERM Hydroactive Gel duoderm or generic equivalent gel dressing. 4cmx4cm. Apply to wound q 3 days until healed.   guaiFENesin 600 MG 12 hr tablet Commonly known as: MUCINEX Take 2 tablets (1,200 mg total) by mouth 2 (two) times daily.   HYDROcodone-acetaminophen 10-325 MG tablet Commonly known as: NORCO TAKE 1 TABLET BY MOUTH EVERY 6 HOURS AS NEEDED FOR PAIN MUST LAST 30 DAYS What changed:   how much to take  how to take this  when to take this  reasons to take this  additional instructions   hydroxypropyl methylcellulose / hypromellose 2.5 % ophthalmic solution Commonly known as: ISOPTO TEARS / GONIOVISC Place 1 drop into  both eyes 3 (three) times daily as needed for dry eyes.   Insulin Pen Needle 31G X 8 MM Misc 1 Units by Does not apply route 2 (two) times a day.   isosorbide mononitrate 30 MG 24 hr tablet Commonly known as: IMDUR Take 1 tablet (30 mg total) by mouth daily.   Lantus 100 UNIT/ML injection Generic drug: insulin glargine INJECT 45 UNITS INTO THE SKIN AT BEDTIME FOR 120 DOSES What changed:   See the new instructions.  Another medication with the same name was removed. Continue taking this medication, and follow the directions you see here.   nystatin powder Commonly known as: MYCOSTATIN/NYSTOP APPLY  1  GRAM TOPICALLY TWICE DAILY AS NEEDED What changed:   how much to take  how to take this  when to take this  reasons to take this  additional instructions   ONE TOUCH ULTRA MINI w/Device Kit Use as directed twice a day   ONE TOUCH ULTRA TEST test strip Generic drug: glucose blood Use as instructed twice a day   onetouch ultrasoft lancets Use as instructed   potassium chloride SA 20 MEQ tablet Commonly known as: K-DUR Take 2 tablets (40 mEq total) by mouth 2 (two) times daily.   tiZANidine 4 MG tablet Commonly known as: ZANAFLEX 1 tablet four times daily as needed for muscle spasms What changed:   how much to take  how to take this  when to take this  reasons to take this  additional instructions   topiramate 100 MG tablet Commonly known as: TOPAMAX Take 1 tablet (100 mg total) by mouth at bedtime.   torsemide 20 MG tablet Commonly known as: DEMADEX TAKE 3 TABLETS (60 MG TOTAL) BY MOUTH 2 TIMES DAILY. What changed: See the new instructions.      Allergies  Allergen Reactions  . Morphine And Related Anaphylaxis    "makes me stop breathing"  . Novocain [Procaine] Anaphylaxis  . Carbamazepine     shaking  . Gabapentin Other (See Comments)    Blood in urine  . Ivp Dye [Iodinated Diagnostic Agents]     Passing out  . Other     Steroids makes  hearts race   [place] Follow up.   Specialty: Home Health Services Why: [facility] nursing/physical/occupational therapy Contact information: [place] [address] [phone]        Darrick Grinder D, NP Follow up in 1 week(s).  Specialty: Cardiology Why: heart failure management Contact information: [address] [place] [address] [phone]        Caren Macadam, MD Follow up in 1 week(s).   Specialty: Family Medicine Why: hospital discharge follow up, pcp to arrange outpatient sleep study, patient may benefit from nightly bipap  Contact information: [place] [address] [phone]            The results of significant diagnostics from this hospitalization (including imaging, microbiology, ancillary and laboratory) are listed below for reference.    Significant Diagnostic Studies: X-ray Chest Pa And Lateral  Result Date: 02/23/2019 CLINICAL DATA:  Decreased O2 saturations. EXAM: CHEST - 2 VIEW COMPARISON:  Multiple previous chest x-rays. The most recent is 02/22/2019 FINDINGS: The heart is enlarged but stable. Persistent central vascular congestion and interstitial and airspace process, likely pulmonary edema. Slight improved aeration since yesterday's film. No definite pleural effusions or focal infiltrates. IMPRESSION: Persistent but improving CHF. Electronically Signed   By: Marijo Sanes M.D.   On: 02/23/2019 10:33   Dg Chest Port 1 View  Result Date: 02/22/2019 CLINICAL DATA:  58 y/o M; history of COPD and CHF. Shortness of breath. EXAM: PORTABLE CHEST 1 VIEW COMPARISON:  04/30/2018 chest radiograph. FINDINGS: Cardiomegaly, CABG, and median sternotomy postsurgical changes. Diffuse patchy opacifications of the lungs. Possible small right effusion. No pneumothorax. No acute osseous abnormality is evident. IMPRESSION: Diffuse patchy opacifications of the lungs  probably represents pulmonary edema. Possible small right effusion. Cardiomegaly. Electronically Signed   By: Kristine Garbe M.D.   On: 02/22/2019 04:09    Microbiology: Recent Results (from the past 240 hour(s))  SARS Coronavirus 2 (CEPHEID- Performed in [place] hospital lab), Hosp Order     Status: None   Collection Time: 02/22/19  3:24 AM   Specimen: Nasopharyngeal Swab  Result Value Ref Range Status   SARS Coronavirus 2 NEGATIVE NEGATIVE Final    Comment: (NOTE) If result is NEGATIVE SARS-CoV-2 target nucleic acids are NOT DETECTED. The SARS-CoV-2 RNA is generally detectable in upper and lower  respiratory specimens during the acute phase of infection. The lowest  concentration of SARS-CoV-2 viral copies this assay can detect is 250  copies / mL. A negative result does not preclude SARS-CoV-2 infection  and should not be used as the sole basis for treatment or other  patient management decisions.  A negative result may occur with  improper specimen collection / handling, submission of specimen other  than nasopharyngeal swab, presence of viral mutation(s) within the  areas targeted by this assay, and inadequate number of viral copies  (<250 copies / mL). A negative result must be combined with clinical  observations, patient history, and epidemiological information. If result is POSITIVE SARS-CoV-2 target nucleic acids are DETECTED. The SARS-CoV-2 RNA is generally detectable in upper and lower  respiratory specimens dur ing the acute phase of infection.  Positive  results are indicative of active infection with SARS-CoV-2.  Clinical  correlation with patient history and other diagnostic information is  necessary to determine patient infection status.  Positive results do  not rule out bacterial infection or co-infection with other viruses. If result is PRESUMPTIVE POSTIVE SARS-CoV-2 nucleic acids MAY BE PRESENT.   A presumptive positive result was obtained on the  submitted specimen  and confirmed on repeat testing.  While 2019 novel coronavirus  (SARS-CoV-2) nucleic acids may be present in the submitted sample  additional confirmatory testing may be necessary for epidemiological  and / or clinical management purposes  to differentiate between  SARS-CoV-2 and other Sarbecovirus currently known to infect humans.  If clinically indicated additional testing with an alternate test  methodology [phone]) is advised. The SARS-CoV-2 RNA is generally  detectable in upper and lower respiratory sp ecimens during the acute  phase of infection. The expected result is Negative. Fact Sheet for Patients:  StrictlyIdeas.no Fact Sheet for Healthcare Providers: BankingDealers.co.za This test is not yet approved or cleared by the [place] FDA and has been authorized for detection and/or diagnosis of SARS-CoV-2 by FDA under an Emergency Use Authorization (EUA).  This EUA will remain in effect (meaning this test can be used) for the duration of the COVID-19 declaration under Section 564(b)(1) of the Act, 21 U.S.C. section 360bbb-3(b)(1), unless the authorization is terminated or revoked sooner. Performed at [facility], [place] [address] [place], [place] [address]   Urine culture     Status: Abnormal   Collection Time: 02/22/19  8:15 AM   Specimen: Urine, Clean Catch  Result Value Ref Range Status   Specimen Description   Final    URINE, CLEAN CATCH Performed at [facility], [place] [address] [place], [place] [address]    Special Requests   Final    NONE Performed at [facility], [place] [address] [place], [place] [address]    Culture 50,000 COLONIES/mL PROTEUS MIRABILIS (A)  Final   Report Status 02/24/2019 FINAL  Final   Organism ID, Bacteria PROTEUS MIRABILIS (A)  Final      Susceptibility   Proteus mirabilis - MIC*    AMPICILLIN <=2 SENSITIVE  Sensitive     CEFAZOLIN <=4 SENSITIVE Sensitive     CEFTRIAXONE <=1 SENSITIVE Sensitive     CIPROFLOXACIN <=0.25 SENSITIVE Sensitive     GENTAMICIN <=1 SENSITIVE Sensitive     IMIPENEM 2 SENSITIVE Sensitive     NITROFURANTOIN 128 RESISTANT Resistant     TRIMETH/SULFA <=20 SENSITIVE Sensitive     AMPICILLIN/SULBACTAM <=2 SENSITIVE Sensitive     PIP/TAZO <=4 SENSITIVE Sensitive     * 50,000 COLONIES/mL PROTEUS MIRABILIS  MRSA PCR Screening     Status: None   Collection Time: 02/22/19 10:03 AM   Specimen: Nasopharyngeal  Result Value Ref Range Status   MRSA by PCR NEGATIVE NEGATIVE Final    Comment:        The GeneXpert MRSA Assay (FDA approved for NASAL specimens only), is one component of a comprehensive MRSA colonization surveillance program. It is not intended to diagnose MRSA infection nor to guide or monitor treatment for MRSA infections. Performed at [facility], [place] [address] [place],  [address]      Labs: Basic Metabolic Panel: Recent Labs  Lab 02/23/19 0222 02/24/19 1213 02/25/19 0457 02/26/19 0558 02/27/19 0518  NA 140 139 142 139 143  K 3.6 4.0 3.4* 3.5 3.7  CL 97* 93* 98 96* 97*  CO2 32 33* 32 34* 34*  GLUCOSE 281* 324* 156* 218* 186*  BUN 39* 67* 71* 71* 62*  CREATININE 2.60* 2.86* 2.77* 2.65* 2.26*  CALCIUM 8.8* 8.4* 8.5* 8.4* 8.5*   Liver Function Tests: Recent Labs  Lab 02/22/19 0324 02/23/19 0222 02/27/19 0518  AST _0 ALT _1 ALKPHOS 231* 201* 163*  BILITOT 0.2* 0.8 0.4  PROT 8.1 8.2* 7.5  ALBUMIN 3.4* 3.1* 3.1*   No results for input(s): LIPASE, AMYLASE in the last 168 hours. No  results for input(s): AMMONIA in the last 168 hours. CBC: Recent Labs  Lab 02/22/19 0324 02/22/19 0934 02/23/19 0222 02/27/19 0518  WBC 11.6* 11.9* 14.7* 12.4*  NEUTROABS 8.7* 10.7*  --  8.8*  HGB 12.5* 12.9* 12.7* 13.9  HCT 45.9 45.0 45.1 48.3  MCV 108.0* 108.2* 104.9* 105.5*  PLT 223 194 272 202   Cardiac  Enzymes: No results for input(s): CKTOTAL, CKMB, CKMBINDEX, TROPONINI in the last 168 hours. BNP: BNP (last 3 results) Recent Labs    04/30/18 2044 10/09/18 1135 02/22/19 0324  BNP 557.2* 45.9 230.6*    ProBNP (last 3 results) No results for input(s): PROBNP in the last 8760 hours.  CBG: Recent Labs  Lab 02/26/19 1156 02/26/19 1640 02/26/19 2117 02/27/19 0738 02/27/19 1130  GLUCAP 238* 217* 184* 175* 229*       Signed:  Florencia Reasons MD, PhD  [facility] 02/27/2019, 2:23 PM

## 2019-02-27 NOTE — Progress Notes (Signed)
Nutrition Follow-up  RD working remotely.   DOCUMENTATION CODES:   Morbid obesity  INTERVENTION:  - continue prostat TID. - continue to encourage PO intakes.    NUTRITION DIAGNOSIS:   Increased nutrient needs related to acute illness as evidenced by estimated needs. -ongoing  GOAL:   Patient will meet greater than or equal to 90% of their needs -met on average  MONITOR:   PO intake, Supplement acceptance, Labs, Weight trends, Skin, I & O's  ASSESSMENT:   58 year-old male with medical history of CAD s/p CABG with LIMA to LAD, fibromyalgia, gout, history of atrial fibrillation with RVR s/p prior cardioversion, CHF, COPD on 3L O2 at home via nasal cannula, CKD stage 3, type 2 DM, history of nephrolithiasis, and history of CVA. He presented to ED with shortness of breath and somnolence after his wife called 911 for dyspnea. Prior to this, he had taken a pain pill. In the ED, ABG showed respiratory acidosis and patient was placed on 15L nonrebreather.  He was admitted to SDU for BiPAP, steroids, and diuresis. Cardiology was consulted for suspected CHF exacerbation.  Weight has been fairly stable since admission on 7/4. Per RN flow sheet, patient has been consuming 50-100% of all meals over the past 3 days. Per review of order, patient has accepted all packets of prostat offered to him.   Discharge order and summary in place from earlier this AM. Patient to discharge home.     Medications reviewed; 60 mg oral lasix BID, sliding scale novolog, 40 units lantus/day, daily multivitamin with minerals Labs reviewed; CBGs: 175 and 229 mg/dl today, Cl: 97 mmol/l, BUN: 62 mg/dl, creatinine: 2.26 mg/dl, Ca: 8.5 mg/dl, Alk Phos elevated, GFR: 31 ml/min.     Diet Order:   Diet Order            Diet - low sodium heart healthy        Diet heart healthy/carb modified Room service appropriate? Yes; Fluid consistency: Thin; Fluid restriction: 1500 mL Fluid  Diet effective now               EDUCATION NEEDS:   Not appropriate for education at this time  Skin:  Skin Assessment: Reviewed RN Assessment Skin Integrity Issues:: DTI DTI: L buttocks  Last BM:  7/5  Height:   Ht Readings from Last 1 Encounters:  02/22/19 5' 10"  (1.778 m)    Weight:   Wt Readings from Last 1 Encounters:  02/27/19 (!) 167.1 kg    Ideal Body Weight:  75.4 kg  BMI:  Body mass index is 52.86 kg/m.  Estimated Nutritional Needs:   Kcal:  9747-1855 kcal  Protein:  110-120 grams  Fluid:  >/= 1.5 L/day     Jarome Matin, MS, RD, LDN, [facility] Inpatient Clinical Dietitian Pager # [phone] After hours/weekend pager # [phone]

## 2019-02-28 ENCOUNTER — Other Ambulatory Visit: Payer: Self-pay

## 2019-02-28 DIAGNOSIS — I5042 Chronic combined systolic (congestive) and diastolic (congestive) heart failure: Secondary | ICD-10-CM | POA: Diagnosis not present

## 2019-02-28 NOTE — Patient Outreach (Signed)
  [facility]) Care Management Chronic Special Needs Program    02/28/2019  Name: Vance Hochmuth., DOB: 07/17/1961  MRN: [id number]   Mr. Talib Headley is enrolled in a chronic special needs plan for Heart Failure. Reviewed and updated care plan.  Client admitted to [facility] on 02/22/2019 with acute on chronic respiratory failure with hypoxia and hypercapnia; acute on chronic congestive heart failure. Client discharged on 02/27/2019 to home with home health nursing/PT/OT.  Plan: transition of care per utilization management team. RNCM will continue to follow.  Thea Silversmith, RN, MSN, [place] [phone]

## 2019-03-03 ENCOUNTER — Ambulatory Visit: Payer: Self-pay | Admitting: Pharmacist

## 2019-03-03 ENCOUNTER — Other Ambulatory Visit: Payer: Self-pay | Admitting: Pharmacist

## 2019-03-03 NOTE — Patient Outreach (Addendum)
.  [place] [facility]) Care Management [place]  03/03/2019  Wendy Mikles. December 09, 1960 [id number]  Reason for call: post discharge call; transition from Lantus vial to [place] pen  Successful care coordination call to patient and wife with HIPAA identifiers verified x2.  Patient states he is doing well post discharge.  Discussed diabetes management and transition to [employer].  Counseled patient & wife on how to prime insulin pens upon first use.  Patient's wife was able to demonstrate this on the phone.  She primed pen for use tonight.  They were also able to see how to "dial" pen to dose of 45 units for daily dose.  Patient will take tonight at 8pm as directed by PCP.  Discussed with patient that Nancee Liter was the brand name for insulin glargine.  Discussed diet with patient.  He is still consuming large portions of fruit.  Encouraged appropriate portion sizes and alternative snack options.  Patient was also inquiring about Eliquis patient assistance application.  I will route message to [facility] CPhT, Etter Sjogren who is assisting with this process.  PLAN: -I will follow up with patient next month  Regina Eck, PharmD, [place]  [phone]

## 2019-03-05 ENCOUNTER — Other Ambulatory Visit: Payer: Self-pay

## 2019-03-05 ENCOUNTER — Other Ambulatory Visit: Payer: Self-pay | Admitting: Pharmacy Technician

## 2019-03-05 NOTE — Patient Outreach (Signed)
[facility]) Care Management  03/05/2019  [name]. April 03, 1961 [id number]                          Medication Assistance Referral  Referral From: [facility] RPh Jenne Pane  Medication/Company: Eliquis / [employer] Patient application portion:  Mailed Provider application portion: Faxed  to Dr. Aundra Dubin   Follow up:  Will follow up with patient in 7-10 business days to confirm application(s) have been received.  Maud Deed Chana Bode [place] Certified Pharmacy Technician [place] [facility] Direct Dial:[phone]

## 2019-03-05 NOTE — Patient Outreach (Signed)
  [facility] [place] [facility]) Care Management Chronic Special Needs Program   03/05/2019  Name: [name]., DOB: 08-Jan-1961  MRN: [id number]  The client was discussed in today's interdisciplinary care team meeting.  The following issues were discussed:  Client's needs, Changes in health status, Care Plan, Coordination of care and Care transitions  Participants present: Bary Castilla, [place]; Peter Garter, RNCM; Kelli Churn, RNCM; Thea Silversmith, RNCM; Dr. Marco Collie; Dr. Coralie Carpen  Recommendations/plan:  RNCM to follow up with client regarding discharge recommendations(sleep study); to be evaluated for [facility] services; review advanced care options.   Follow-up:  RNCM will continue to follow as C-SNP chronic care management coordinator.  Thea Silversmith, RN, MSN, [place] [phone]

## 2019-03-10 ENCOUNTER — Telehealth (HOSPITAL_COMMUNITY): Payer: Self-pay | Admitting: Licensed Clinical Social Worker

## 2019-03-10 NOTE — Telephone Encounter (Signed)
CSW following up on [employer] application that was submitted back in February by clinic.  [name] reports that they denied pt application until he reached $113.94 out of pocket cost.  CSW called pharmacy and confirmed that pt had reached that limit.  Pharmacy faxed CSW cost report and CSW sent it to [place] for review.  Pt [name] ID#: [id number]  CSW will continue to follow and assist as needed  Jorge Ny, [place] [facility] Desk#: [phone] Cell#: [phone]

## 2019-03-11 ENCOUNTER — Telehealth (HOSPITAL_COMMUNITY): Payer: Self-pay | Admitting: Pharmacy Technician

## 2019-03-11 NOTE — Telephone Encounter (Signed)
Patient was approved to receive Eliquis from [employer] through the remainder of the calendar year. Representative stated that they he can re-enroll 07/02/2019.  Called pt to make him aware of this and to set up shipment through [facility]. [employer] stated that they will overnight the medication via UPS with no signature requirement and the patient should receive it tomorrow 03/12/2019.  Charlann Boxer, CPhT

## 2019-03-14 ENCOUNTER — Ambulatory Visit (INDEPENDENT_AMBULATORY_CARE_PROVIDER_SITE_OTHER): Payer: HMO | Admitting: Family Medicine

## 2019-03-14 ENCOUNTER — Other Ambulatory Visit: Payer: Self-pay

## 2019-03-14 ENCOUNTER — Encounter: Payer: Self-pay | Admitting: Family Medicine

## 2019-03-14 ENCOUNTER — Telehealth: Payer: Self-pay | Admitting: *Deleted

## 2019-03-14 DIAGNOSIS — N183 Chronic kidney disease, stage 3 unspecified: Secondary | ICD-10-CM

## 2019-03-14 DIAGNOSIS — R0981 Nasal congestion: Secondary | ICD-10-CM

## 2019-03-14 DIAGNOSIS — IMO0002 Reserved for concepts with insufficient information to code with codable children: Secondary | ICD-10-CM

## 2019-03-14 DIAGNOSIS — R7989 Other specified abnormal findings of blood chemistry: Secondary | ICD-10-CM

## 2019-03-14 DIAGNOSIS — D539 Nutritional anemia, unspecified: Secondary | ICD-10-CM

## 2019-03-14 DIAGNOSIS — E119 Type 2 diabetes mellitus without complications: Secondary | ICD-10-CM | POA: Diagnosis not present

## 2019-03-14 DIAGNOSIS — D72829 Elevated white blood cell count, unspecified: Secondary | ICD-10-CM

## 2019-03-14 DIAGNOSIS — L8990 Pressure ulcer of unspecified site, unspecified stage: Secondary | ICD-10-CM | POA: Diagnosis not present

## 2019-03-14 DIAGNOSIS — Z794 Long term (current) use of insulin: Secondary | ICD-10-CM

## 2019-03-14 DIAGNOSIS — IMO0001 Reserved for inherently not codable concepts without codable children: Secondary | ICD-10-CM

## 2019-03-14 DIAGNOSIS — E1151 Type 2 diabetes mellitus with diabetic peripheral angiopathy without gangrene: Secondary | ICD-10-CM

## 2019-03-14 MED ORDER — ALBUTEROL SULFATE HFA 108 (90 BASE) MCG/ACT IN AERS: 2.0000 | INHALATION_SPRAY | Freq: Four times a day (QID) | RESPIRATORY_TRACT | 5 refills | Status: AC | PRN

## 2019-03-14 NOTE — Progress Notes (Signed)
Virtual Visit via Telephone Note  I connected with [name]. on 03/14/19 at 10:30 by telephone and verified that I am speaking with the correct person using two identifiers.   I discussed the limitations, risks, security and privacy concerns of performing an evaluation and management service by telephone and the availability of in person appointments. I also discussed with the patient that there may be a patient responsible charge related to this service. The patient expressed understanding and agreed to proceed.  Location patient: home Location provider: work office Participants present for the call: patient, provider Patient did not have a visit in the prior 7 days to address this/these issue(s).   [name]. DOB: [birth year] Encounter date: 03/14/2019  This is a 58 y.o. male who presents with No chief complaint on file.   History of present illness: In hospital recently 02/22/19-02/27/19. Sleep study recommended  (per chart he refuses)  "doing pretty well except for 2 things" -  1. Just got duoderm dressing this week. Spot on bottom is same size; has been using neospoin on it thus far with bandaid.  2. Right side of nose - has been picking this to get dried blood/hardened congestion out of nose; if he doesn't pick this then he can't breathe. Has bothered him for a couple of months. Not a lot of nasal congestion. Not getting nose bleeds.    CAD/A fib w RVR/diastolic HF: he is following with cardiology. States that he feels much better since steroids were stopped.   Insulin depd DM: last A1C from 02/06/19 was 8.7. Using dial up pen now (basaglar) for sugars. He is doing well with this. Makes it easier for him and feels like he is doing well with this. Sugar running about 145/225: He checks this in the evening. Takes the basaglar at about 9pm. Was getting steroids while he was in hospital - heart was beating fast and he didn't feel well. Once they stopped getting  him steroids then his sugar started to improve.   CKD stage 3: Creatinine was worse on last check in the hospital.  This needs to be repeated.  He states he feels like he is back to normal with urination at home.  He is hesitant to get any blood work repeated as he feels all of his problems came from getting medications at the hospital and them taking out so much fluid from him.   Chronic pain syndrome: decreased norco dose in hospital due to somnolence after taking pill at home. Patient does not feel that this is related to breathing.    Allergies  Allergen Reactions  . Morphine And Related Anaphylaxis    "makes me stop breathing"  . Novocain [Procaine] Anaphylaxis  . Carbamazepine     shaking  . Gabapentin Other (See Comments)    Blood in urine  . Ivp Dye [Iodinated Diagnostic Agents]     Passing out  . Other     Steroids makes hearts race   No outpatient medications have been marked as taking for the 03/14/19 encounter (Appointment) with Caren Macadam, MD.    Review of Systems  Constitutional: Negative for chills, fatigue and fever.  HENT: Positive for congestion.   Respiratory: Negative for cough, chest tightness, shortness of breath and wheezing.   Cardiovascular: Negative for chest pain, palpitations and leg swelling.  Genitourinary: Negative for difficulty urinating.    Objective:  There were no vitals taken for this visit.      BP  Readings from Last 3 Encounters:  02/27/19 136/62  02/07/19 (!) 157/89  02/06/19 (!) 158/76   Wt Readings from Last 3 Encounters:  02/27/19 (!) 368 lb 6.2 oz (167.1 kg)  02/07/19 (!) 388 lb (176 kg)  02/06/19 (!) 388 lb (176 kg)    Physical Exam Patient sounds well with without SOB on phone.  Speech and thought processing are grossly intact.  Assessment/Plan 1. Elevated serum creatinine Discussed with the patient his elevated serum creatinine on last blood work done at the hospital.  He does not feel any urgency to recheck  this, otherwise stressed that kidney function is more strained than his baseline and it was very important to make sure we recheck to ensure the kidneys are functioning properly, especially since he is on large doses of diuretics.  We will call to set up blood work for him.  This will need to be monitored.  He does not follow with a renal specialist at this time. - Comprehensive metabolic panel; Future  2. Nasal congestion Discussed ways to help decrease nasal congestion.  I suggested nasal saline gel to help with moisturizing as well as decongesting.  Also suggested using Vaseline inside the nose to help moisturize.  If this is not effective, let me know.  3. CKD (chronic kidney disease) stage 3, GFR 30-59 ml/min (HCC) See above.  We will recheck blood work for stability.  4. Insulin dependent diabetes mellitus ([place]) He states that he is having difficulty getting his testing strips at affordable cost, but he is not sure what the cost of testing strips is because his wife is not present and she is usually limited by disease.  We will see if we can touch base with the pharmacist has been helping with getting medications affordable for him.  I have asked him to check a morning fasting glucose so that we can better dose his evening Basaglar.  His A1c is elevated higher than desired.  We will need to monitor and may need to consider mealtime insulin in order to achieve better control.  Below regimen and follow today follow-up on morning sugars and plan to see him more often so that we can continue to address multiple medical conditions.  5. Pressure injury of skin, unspecified injury stage, unspecified location He states that his sores are stable from when he was seen in the office last time.  I instructed he does not need to put Neosporin on the DuoDERM before applying.  We discussed that this will be a good barrier dressing and he can leave on for 3 days as long is not soiled.  If this does not heal up  wound let me know.  6. Macrocytic anemia/leukocystosis: will repeat bloodwork.   *we will need to review need for sleep study. He has previously refused but we will continue to address.  *repeat A1C September *resistance to changes in medical management makes discussions and plans take longer and limit our ability to discuss all issues. We will schedule more frequent visits so that we can make sure we are addressing all of medical issues in detail needed.   I did not refer this patient for an OV in the next 24 hours for this/these issue(s).  I discussed the assessment and treatment plan with the patient. The patient was provided an opportunity to ask questions and all were answered. The patient agreed with the plan and demonstrated an understanding of the instructions.   The patient was advised to call back or  seek an in-person evaluation if the symptoms worsen or if the condition fails to improve as anticipated.  I provided 35 minutes of non-face-to-face time during this encounter.       Micheline Rough, MD

## 2019-03-14 NOTE — Telephone Encounter (Signed)
-----   Message from Caren Macadam, MD sent at 03/14/2019 11:20 AM EDT ----- Please schedule follow up bloodwork. This needs to be done in next week (I will place order shortly). Please send message to [facility] pharmacist- patient states that he cannot afford his testing strips for meter. I would like him to have his next appointment scheduled in 1 month so we can check back in on chronic conditions.

## 2019-03-14 NOTE — Telephone Encounter (Signed)
I called the pt and he stated he will have his wife call back to schedule the appointments.  Referral placed to [facility] as below.

## 2019-03-17 ENCOUNTER — Other Ambulatory Visit: Payer: Self-pay | Admitting: Physical Medicine & Rehabilitation

## 2019-03-17 DIAGNOSIS — G894 Chronic pain syndrome: Secondary | ICD-10-CM

## 2019-03-17 DIAGNOSIS — G510 Bell's palsy: Secondary | ICD-10-CM

## 2019-03-17 DIAGNOSIS — R519 Headache, unspecified: Secondary | ICD-10-CM

## 2019-03-17 DIAGNOSIS — G939 Disorder of brain, unspecified: Secondary | ICD-10-CM

## 2019-03-19 ENCOUNTER — Other Ambulatory Visit (INDEPENDENT_AMBULATORY_CARE_PROVIDER_SITE_OTHER): Payer: HMO

## 2019-03-19 ENCOUNTER — Other Ambulatory Visit: Payer: Self-pay

## 2019-03-19 DIAGNOSIS — D72829 Elevated white blood cell count, unspecified: Secondary | ICD-10-CM | POA: Diagnosis not present

## 2019-03-19 DIAGNOSIS — E1165 Type 2 diabetes mellitus with hyperglycemia: Secondary | ICD-10-CM | POA: Diagnosis not present

## 2019-03-19 DIAGNOSIS — IMO0002 Reserved for concepts with insufficient information to code with codable children: Secondary | ICD-10-CM

## 2019-03-19 DIAGNOSIS — E1169 Type 2 diabetes mellitus with other specified complication: Secondary | ICD-10-CM | POA: Diagnosis not present

## 2019-03-19 DIAGNOSIS — D539 Nutritional anemia, unspecified: Secondary | ICD-10-CM

## 2019-03-19 DIAGNOSIS — E785 Hyperlipidemia, unspecified: Secondary | ICD-10-CM

## 2019-03-19 DIAGNOSIS — R7989 Other specified abnormal findings of blood chemistry: Secondary | ICD-10-CM

## 2019-03-19 DIAGNOSIS — E1151 Type 2 diabetes mellitus with diabetic peripheral angiopathy without gangrene: Secondary | ICD-10-CM | POA: Diagnosis not present

## 2019-03-19 LAB — LIPID PANEL
Cholesterol: 197 mg/dL (ref 0–200)
HDL: 52.6 mg/dL (ref >39.00)
LDL Cholesterol: 113 mg/dL — ABNORMAL HIGH (ref 0–99)
NonHDL: 144.17
Total CHOL/HDL Ratio: 4
Triglycerides: 157 mg/dL — ABNORMAL HIGH (ref 0.0–149.0)
VLDL: 31.4 mg/dL (ref 0.0–40.0)

## 2019-03-19 LAB — HEMOGLOBIN A1C: Hgb A1c MFr Bld: 8.8 % — ABNORMAL HIGH (ref 4.6–6.5)

## 2019-03-19 LAB — CBC WITH DIFFERENTIAL/PLATELET
Basophils Absolute: 0.1 10*3/uL (ref 0.0–0.1)
Basophils Relative: 0.8 % (ref 0.0–3.0)
Eosinophils Absolute: 0.5 10*3/uL (ref 0.0–0.7)
Eosinophils Relative: 8 % — ABNORMAL HIGH (ref 0.0–5.0)
HCT: 41.4 % (ref 39.0–52.0)
Hemoglobin: 12.7 g/dL — ABNORMAL LOW (ref 13.0–17.0)
Lymphocytes Relative: 17.5 % (ref 12.0–46.0)
Lymphs Abs: 1.1 10*3/uL (ref 0.7–4.0)
MCHC: 30.7 g/dL (ref 30.0–36.0)
MCV: 98.9 fl (ref 78.0–100.0)
Monocytes Absolute: 0.6 10*3/uL (ref 0.1–1.0)
Monocytes Relative: 9 % (ref 3.0–12.0)
Neutro Abs: 4 10*3/uL (ref 1.4–7.7)
Neutrophils Relative %: 64.7 % (ref 43.0–77.0)
Platelets: 214 10*3/uL (ref 150.0–400.0)
RBC: 4.19 Mil/uL — ABNORMAL LOW (ref 4.22–5.81)
RDW: 14.7 % (ref 11.5–15.5)
WBC: 6.2 10*3/uL (ref 4.0–10.5)

## 2019-03-19 LAB — COMPREHENSIVE METABOLIC PANEL
ALT: 18 U/L (ref 0–53)
AST: 17 U/L (ref 0–37)
Albumin: 3.6 g/dL (ref 3.5–5.2)
Alkaline Phosphatase: 330 U/L — ABNORMAL HIGH (ref 39–117)
BUN: 25 mg/dL — ABNORMAL HIGH (ref 6–23)
CO2: 31 mEq/L (ref 19–32)
Calcium: 8.8 mg/dL (ref 8.4–10.5)
Chloride: 100 mEq/L (ref 96–112)
Creatinine, Ser: 2.33 mg/dL — ABNORMAL HIGH (ref 0.40–1.50)
GFR: 28.91 mL/min — ABNORMAL LOW (ref >60.00)
Glucose, Bld: 244 mg/dL — ABNORMAL HIGH (ref 70–99)
Potassium: 4.5 mEq/L (ref 3.5–5.1)
Sodium: 141 mEq/L (ref 135–145)
Total Bilirubin: 0.3 mg/dL (ref 0.2–1.2)
Total Protein: 7.4 g/dL (ref 6.0–8.3)

## 2019-03-19 LAB — FOLATE: Folate: 8 ng/mL (ref >5.9)

## 2019-03-19 LAB — TSH: TSH: 1.34 u[IU]/mL (ref 0.35–4.50)

## 2019-03-19 LAB — VITAMIN B12: Vitamin B-12: 583 pg/mL (ref 211–911)

## 2019-03-21 ENCOUNTER — Inpatient Hospital Stay (HOSPITAL_COMMUNITY)
Admission: EM | Admit: 2019-03-21 | Discharge: 2019-03-27 | DRG: 291 | Disposition: A | Discharge status: Home Health | Payer: HMO | Attending: Internal Medicine | Admitting: Internal Medicine

## 2019-03-21 ENCOUNTER — Emergency Department (HOSPITAL_COMMUNITY): Payer: HMO

## 2019-03-21 ENCOUNTER — Other Ambulatory Visit: Payer: Self-pay

## 2019-03-21 DIAGNOSIS — I480 Paroxysmal atrial fibrillation: Secondary | ICD-10-CM | POA: Diagnosis present

## 2019-03-21 DIAGNOSIS — R0602 Shortness of breath: Secondary | ICD-10-CM | POA: Diagnosis not present

## 2019-03-21 DIAGNOSIS — D333 Benign neoplasm of cranial nerves: Secondary | ICD-10-CM | POA: Diagnosis present

## 2019-03-21 DIAGNOSIS — I5031 Acute diastolic (congestive) heart failure: Secondary | ICD-10-CM | POA: Diagnosis present

## 2019-03-21 DIAGNOSIS — Z884 Allergy status to anesthetic agent status: Secondary | ICD-10-CM

## 2019-03-21 DIAGNOSIS — Z9981 Dependence on supplemental oxygen: Secondary | ICD-10-CM

## 2019-03-21 DIAGNOSIS — N184 Chronic kidney disease, stage 4 (severe): Secondary | ICD-10-CM | POA: Diagnosis present

## 2019-03-21 DIAGNOSIS — G9341 Metabolic encephalopathy: Secondary | ICD-10-CM | POA: Diagnosis present

## 2019-03-21 DIAGNOSIS — M109 Gout, unspecified: Secondary | ICD-10-CM | POA: Diagnosis present

## 2019-03-21 DIAGNOSIS — I252 Old myocardial infarction: Secondary | ICD-10-CM

## 2019-03-21 DIAGNOSIS — R05 Cough: Secondary | ICD-10-CM | POA: Diagnosis not present

## 2019-03-21 DIAGNOSIS — N390 Urinary tract infection, site not specified: Secondary | ICD-10-CM | POA: Diagnosis not present

## 2019-03-21 DIAGNOSIS — Z9889 Other specified postprocedural states: Secondary | ICD-10-CM

## 2019-03-21 DIAGNOSIS — J441 Chronic obstructive pulmonary disease with (acute) exacerbation: Secondary | ICD-10-CM | POA: Diagnosis present

## 2019-03-21 DIAGNOSIS — I110 Hypertensive heart disease with heart failure: Secondary | ICD-10-CM | POA: Diagnosis not present

## 2019-03-21 DIAGNOSIS — Z8249 Family history of ischemic heart disease and other diseases of the circulatory system: Secondary | ICD-10-CM

## 2019-03-21 DIAGNOSIS — Z806 Family history of leukemia: Secondary | ICD-10-CM

## 2019-03-21 DIAGNOSIS — Z7901 Long term (current) use of anticoagulants: Secondary | ICD-10-CM

## 2019-03-21 DIAGNOSIS — Z6841 Body Mass Index (BMI) 40.0 and over, adult: Secondary | ICD-10-CM

## 2019-03-21 DIAGNOSIS — J9621 Acute and chronic respiratory failure with hypoxia: Secondary | ICD-10-CM | POA: Diagnosis present

## 2019-03-21 DIAGNOSIS — I25810 Atherosclerosis of coronary artery bypass graft(s) without angina pectoris: Secondary | ICD-10-CM | POA: Diagnosis present

## 2019-03-21 DIAGNOSIS — T502X5A Adverse effect of carbonic-anhydrase inhibitors, benzothiadiazides and other diuretics, initial encounter: Secondary | ICD-10-CM | POA: Diagnosis not present

## 2019-03-21 DIAGNOSIS — R0902 Hypoxemia: Secondary | ICD-10-CM | POA: Diagnosis not present

## 2019-03-21 DIAGNOSIS — Z885 Allergy status to narcotic agent status: Secondary | ICD-10-CM

## 2019-03-21 DIAGNOSIS — I248 Other forms of acute ischemic heart disease: Secondary | ICD-10-CM | POA: Diagnosis not present

## 2019-03-21 DIAGNOSIS — G8929 Other chronic pain: Secondary | ICD-10-CM | POA: Diagnosis present

## 2019-03-21 DIAGNOSIS — Z1624 Resistance to multiple antibiotics: Secondary | ICD-10-CM | POA: Diagnosis not present

## 2019-03-21 DIAGNOSIS — E1169 Type 2 diabetes mellitus with other specified complication: Secondary | ICD-10-CM

## 2019-03-21 DIAGNOSIS — I5042 Chronic combined systolic (congestive) and diastolic (congestive) heart failure: Secondary | ICD-10-CM | POA: Diagnosis not present

## 2019-03-21 DIAGNOSIS — Z20828 Contact with and (suspected) exposure to other viral communicable diseases: Secondary | ICD-10-CM | POA: Diagnosis present

## 2019-03-21 DIAGNOSIS — G510 Bell's palsy: Secondary | ICD-10-CM

## 2019-03-21 DIAGNOSIS — L8990 Pressure ulcer of unspecified site, unspecified stage: Secondary | ICD-10-CM | POA: Diagnosis present

## 2019-03-21 DIAGNOSIS — I130 Hypertensive heart and chronic kidney disease with heart failure and stage 1 through stage 4 chronic kidney disease, or unspecified chronic kidney disease: Principal | ICD-10-CM | POA: Diagnosis present

## 2019-03-21 DIAGNOSIS — N179 Acute kidney failure, unspecified: Secondary | ICD-10-CM | POA: Diagnosis not present

## 2019-03-21 DIAGNOSIS — I2510 Atherosclerotic heart disease of native coronary artery without angina pectoris: Secondary | ICD-10-CM | POA: Diagnosis present

## 2019-03-21 DIAGNOSIS — J9602 Acute respiratory failure with hypercapnia: Secondary | ICD-10-CM

## 2019-03-21 DIAGNOSIS — E1122 Type 2 diabetes mellitus with diabetic chronic kidney disease: Secondary | ICD-10-CM | POA: Diagnosis present

## 2019-03-21 DIAGNOSIS — E662 Morbid (severe) obesity with alveolar hypoventilation: Secondary | ICD-10-CM | POA: Diagnosis present

## 2019-03-21 DIAGNOSIS — J9622 Acute and chronic respiratory failure with hypercapnia: Secondary | ICD-10-CM | POA: Diagnosis present

## 2019-03-21 DIAGNOSIS — Z794 Long term (current) use of insulin: Secondary | ICD-10-CM

## 2019-03-21 DIAGNOSIS — R4182 Altered mental status, unspecified: Secondary | ICD-10-CM | POA: Diagnosis not present

## 2019-03-21 DIAGNOSIS — R609 Edema, unspecified: Secondary | ICD-10-CM | POA: Diagnosis not present

## 2019-03-21 DIAGNOSIS — Z91041 Radiographic dye allergy status: Secondary | ICD-10-CM

## 2019-03-21 DIAGNOSIS — I5033 Acute on chronic diastolic (congestive) heart failure: Secondary | ICD-10-CM | POA: Diagnosis present

## 2019-03-21 DIAGNOSIS — Z87891 Personal history of nicotine dependence: Secondary | ICD-10-CM

## 2019-03-21 DIAGNOSIS — L89159 Pressure ulcer of sacral region, unspecified stage: Secondary | ICD-10-CM | POA: Diagnosis present

## 2019-03-21 DIAGNOSIS — I639 Cerebral infarction, unspecified: Secondary | ICD-10-CM | POA: Diagnosis present

## 2019-03-21 DIAGNOSIS — I509 Heart failure, unspecified: Secondary | ICD-10-CM | POA: Diagnosis not present

## 2019-03-21 DIAGNOSIS — M797 Fibromyalgia: Secondary | ICD-10-CM | POA: Diagnosis present

## 2019-03-21 DIAGNOSIS — I10 Essential (primary) hypertension: Secondary | ICD-10-CM | POA: Diagnosis not present

## 2019-03-21 DIAGNOSIS — J9690 Respiratory failure, unspecified, unspecified whether with hypoxia or hypercapnia: Secondary | ICD-10-CM | POA: Diagnosis present

## 2019-03-21 DIAGNOSIS — Z888 Allergy status to other drugs, medicaments and biological substances status: Secondary | ICD-10-CM

## 2019-03-21 DIAGNOSIS — R062 Wheezing: Secondary | ICD-10-CM | POA: Diagnosis not present

## 2019-03-21 DIAGNOSIS — Z833 Family history of diabetes mellitus: Secondary | ICD-10-CM

## 2019-03-21 DIAGNOSIS — G609 Hereditary and idiopathic neuropathy, unspecified: Secondary | ICD-10-CM | POA: Diagnosis present

## 2019-03-21 LAB — COMPREHENSIVE METABOLIC PANEL
ALT: 23 U/L (ref 0–44)
AST: 19 U/L (ref 15–41)
Albumin: 3.2 g/dL — ABNORMAL LOW (ref 3.5–5.0)
Alkaline Phosphatase: 259 U/L — ABNORMAL HIGH (ref 38–126)
Anion gap: 9 (ref 5–15)
BUN: 25 mg/dL — ABNORMAL HIGH (ref 6–20)
CO2: 36 mmol/L — ABNORMAL HIGH (ref 22–32)
Calcium: 8.9 mg/dL (ref 8.9–10.3)
Chloride: 98 mmol/L (ref 98–111)
Creatinine, Ser: 2.34 mg/dL — ABNORMAL HIGH (ref 0.61–1.24)
GFR calc Af Amer: 34 mL/min — ABNORMAL LOW (ref >60)
GFR calc non Af Amer: 30 mL/min — ABNORMAL LOW (ref >60)
Glucose, Bld: 190 mg/dL — ABNORMAL HIGH (ref 70–99)
Potassium: 4.2 mmol/L (ref 3.5–5.1)
Sodium: 143 mmol/L (ref 135–145)
Total Bilirubin: 0.5 mg/dL (ref 0.3–1.2)
Total Protein: 7.9 g/dL (ref 6.5–8.1)

## 2019-03-21 LAB — ETHANOL: Alcohol, Ethyl (B): <10 mg/dL (ref <10)

## 2019-03-21 LAB — CBC WITH DIFFERENTIAL/PLATELET
Abs Immature Granulocytes: 0.12 10*3/uL — ABNORMAL HIGH (ref 0.00–0.07)
Basophils Absolute: 0.1 10*3/uL (ref 0.0–0.1)
Basophils Relative: 1 %
Eosinophils Absolute: 0.5 10*3/uL (ref 0.0–0.5)
Eosinophils Relative: 7 %
HCT: 42.2 % (ref 39.0–52.0)
Hemoglobin: 11.5 g/dL — ABNORMAL LOW (ref 13.0–17.0)
Immature Granulocytes: 2 %
Lymphocytes Relative: 13 %
Lymphs Abs: 1 10*3/uL (ref 0.7–4.0)
MCH: 29.3 pg (ref 26.0–34.0)
MCHC: 27.3 g/dL — ABNORMAL LOW (ref 30.0–36.0)
MCV: 107.4 fL — ABNORMAL HIGH (ref 80.0–100.0)
Monocytes Absolute: 0.9 10*3/uL (ref 0.1–1.0)
Monocytes Relative: 12 %
Neutro Abs: 5 10*3/uL (ref 1.7–7.7)
Neutrophils Relative %: 65 %
Platelets: 205 10*3/uL (ref 150–400)
RBC: 3.93 MIL/uL — ABNORMAL LOW (ref 4.22–5.81)
RDW: 13.2 % (ref 11.5–15.5)
WBC: 7.5 10*3/uL (ref 4.0–10.5)
nRBC: 0 % (ref 0.0–0.2)

## 2019-03-21 LAB — BLOOD GAS, VENOUS
Acid-Base Excess: 7.1 mmol/L — ABNORMAL HIGH (ref 0.0–2.0)
Bicarbonate: 38.2 mmol/L — ABNORMAL HIGH (ref 20.0–28.0)
FIO2: 21
O2 Saturation: 86.4 %
Patient temperature: 98.6
pCO2, Ven: 101 mmHg (ref 44.0–60.0)
pH, Ven: 7.201 — ABNORMAL LOW (ref 7.250–7.430)
pO2, Ven: 53.9 mmHg — ABNORMAL HIGH (ref 32.0–45.0)

## 2019-03-21 LAB — URINALYSIS, ROUTINE W REFLEX MICROSCOPIC
Bilirubin Urine: NEGATIVE
Glucose, UA: NEGATIVE mg/dL
Ketones, ur: NEGATIVE mg/dL
Nitrite: POSITIVE — AB
Protein, ur: NEGATIVE mg/dL
Specific Gravity, Urine: 1.009 (ref 1.005–1.030)
pH: 5 (ref 5.0–8.0)

## 2019-03-21 LAB — PROTIME-INR
INR: 1.1 (ref 0.8–1.2)
Prothrombin Time: 14.2 seconds (ref 11.4–15.2)

## 2019-03-21 LAB — BRAIN NATRIURETIC PEPTIDE: B Natriuretic Peptide: 282 pg/mL — ABNORMAL HIGH (ref 0.0–100.0)

## 2019-03-21 LAB — SARS CORONAVIRUS 2 BY RT PCR (HOSPITAL ORDER, PERFORMED IN ~~LOC~~ HOSPITAL LAB): SARS Coronavirus 2: NEGATIVE

## 2019-03-21 MED ORDER — SODIUM CHLORIDE 0.9 % IV BOLUS
500.0000 mL | Freq: Once | INTRAVENOUS | Status: AC
  Administered 2019-03-21: 500 mL via INTRAVENOUS

## 2019-03-21 MED ORDER — ALBUTEROL SULFATE HFA 108 (90 BASE) MCG/ACT IN AERS
6.0000 | INHALATION_SPRAY | Freq: Once | RESPIRATORY_TRACT | Status: AC
  Administered 2019-03-21: 6 via RESPIRATORY_TRACT
  Filled 2019-03-21: qty 6.7

## 2019-03-21 MED ORDER — FUROSEMIDE 10 MG/ML IJ SOLN
60.0000 mg | Freq: Once | INTRAMUSCULAR | Status: AC
  Administered 2019-03-22: 60 mg via INTRAVENOUS
  Filled 2019-03-21: qty 8

## 2019-03-21 NOTE — ED Provider Notes (Signed)
[facility] DEPT Provider Note   CSN: [id number] Arrival date & time: 03/21/19  2034    History   Chief Complaint Chief Complaint  Patient presents with   Shortness of Breath   Weakness    generalized weakness    HPI Merlin Golden. is a 58 y.o. male.     HPI  Level 5 caveat for altered mental status.  58 y.o male presents with cc of lethargy/ams. He has medical history significant for stroke with right sided facial droop, acoustic neuroma, CAD s/p cabg 2009, htn, gout, insulin-dependent dm, CHF, paf s/p ablation on anticoagulation, ckd3/4, chronic hypoxic respiratory failure on 3 L [place] O2 at home, morbid obesity.  According to the EMS, patient's wife called EMS because he has been having increased weakness and confusion over the past few days.  Patient was recently admitted to the hospital for hypercapnia and hypoxic respiratory failure.  Patient is oriented to self only.  He is not able to provide meaningful history.  He does indicate that he is not having any pain, new cough chest pain, wheezing, abdominal pain.  He is aware that he was brought to the hospital but unsure why or who brought him in.  Past Medical History:  Diagnosis Date   Brain tumor [facility])    Takes Topamax so patient will not have headaches   Bruises easily    CAD (coronary artery disease)    Cath 09, 99% LAD   Cardiomyopathy, ischemic    Ischemic   CHF (congestive heart failure) (HCC)    Cyst near coccyx    Cyst near tailbone    Diabetes mellitus without complication (HCC)    borderline   Edema of both legs    Takes Lasix   Fibromyalgia    Gout    Headache(784.0)    with lights   Hypertension    dr hochrein   Kidney stones    Myocardial infarction ([place])    Obesity    Pneumonia    hx of   Shortness of breath    Stroke ([place])    Affected vision, walking, and facial drooping on right face    Patient Active Problem List   Diagnosis Date Noted   Respiratory failure ([place]) 03/22/2019   Morbid obesity with BMI of 50.0-59.9, adult ([place])    Acute on chronic diastolic CHF (congestive heart failure) ([place]) 02/23/2019   Acute renal failure with acute renal cortical necrosis superimposed on stage 4 chronic kidney disease ([place])    Acute respiratory failure with hypoxia and hypercapnia ([place]) 02/22/2019   SOB (shortness of breath) 02/22/2019   Acute exacerbation of chronic obstructive pulmonary disease (COPD) ([place]) 02/22/2019   CKD (chronic kidney disease) stage 3, GFR 30-59 ml/min ([place]) 09/14/2018   Insulin dependent diabetes mellitus ([place]) 09/14/2018   Chronic diastolic heart failure ([place]) 33/82/5053   Acute diastolic heart failure (HCC)    Atrial fibrillation with RVR ([place])    Carbon dioxide retention    Pressure injury of skin 01/29/2017   Hereditary and idiopathic peripheral neuropathy 01/08/2017   Acute on chronic respiratory failure with hypoxia and hypercapnia ([place]) 02/16/2016   Irritation of right eye 10/18/2015   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   Headache due to intracranial disease 10/13/2013   Edema 12/17/2012   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   Acoustic neuroma ([place]) 10/12/2012   Status post craniotomy 10/12/2012   CAD (coronary artery disease) of artery bypass graft 09/09/2012   Failed CABG (  coronary artery bypass graft) 09/09/2012   Obesity hypoventilation syndrome ([place]) 09/09/2012    Past Surgical History:  Procedure Laterality Date   ACOUSTIC NEUROMA RESECTION N/A 10/11/2012   Procedure: ACOUSTIC NEUROMA RESECTION;  Surgeon: Ascencion Dike, MD;  Location: [facility] NEURO ORS;  Service: ENT;  Laterality: N/A;   ANAL FISTULECTOMY     CARDIOVERSION N/A 05/08/2018   Procedure: CARDIOVERSION;  Surgeon: [name], MD;  Location: [facility] END[facility]COPY;  Service: Cardiovascular;  Laterality: N/A;   CORONARY ARTERY BYPASS GRAFT     LIMA to LAD 2009   CRANIOTOMY Right 11/20/2012    Procedure: CRANIOTOMY REPAIR DURAL/CENTRAL SPINAL FLUID LEAK;  Surgeon: [name], MD;  Location: [facility] NEURO ORS;  Service: Neurosurgery;  Laterality: Right;   EYE SURGERY     to coorect lid and double vision   MEDIAN RECTUS REPAIR Right 02/16/2016   Procedure: RIGHT LATERAL RECTUS RESECTION; SUPERIOR RECTUS RESECTION RIGHT EYE; RIGHT SUPERIOR RECTUS RECESSION; LATERAL TARSAL STRIP RIGHT LOWER EYELID;  Surgeon: [name], MD;  Location: [facility];  Service: Ophthalmology;  Laterality: Right;   PLACEMENT OF LUMBAR DRAIN N/A 11/20/2012   Procedure: Attempted PLACEMENT OF LUMBAR DRAIN;  Surgeon: [name], MD;  Location: [place] NEURO ORS;  Service: Neurosurgery;  Laterality: N/A;   RETROSIGMOID CRANIECTOMY FOR TUMOR RESECTION Right 10/11/2012   Procedure: RETROSIGMOID CRANIECTOMY FOR TUMOR RESECTION;  Surgeon: [name], MD;  Location: [facility] NEURO ORS;  Service: Neurosurgery;  Laterality: Right;  Craniotomy for acoustic neuroma   TEE WITHOUT CARDIOVERSION N/A 05/08/2018   Procedure: TRANSESOPHAGEAL ECHOCARDIOGRAM (TEE);  Surgeon: [name], MD;  Location: [facility] ENDOSCOPY;  Service: Cardiovascular;  Laterality: N/A;   TRACHEOSTOMY TUBE PLACEMENT N/A 10/11/2012   Procedure: TRACHEOSTOMY;  Surgeon: Ascencion Dike, MD;  Location: [facility] NEURO ORS;  Service: ENT;  Laterality: N/A;   UMBILICAL HERNIA REPAIR          Home Medications    Prior to Admission medications   Medication Sig Start Date End Date Taking? Authorizing Provider  albuterol (VENTOLIN HFA) 108 (90 Base) MCG/ACT inhaler Inhale 2 puffs into the lungs every 6 (six) hours as needed for wheezing or shortness of breath. 03/14/19  Yes Koberlein, Junell C, MD  amiodarone (PACERONE) 100 MG tablet Take 1 tablet (100 mg total) by mouth daily. 02/06/19  Yes Clegg, Amy D, NP  apixaban (ELIQUIS) 5 MG TABS tablet Take 1 tablet (5 mg total) by mouth 2 (two) times daily. Patient taking differently: Take 5 mg by mouth daily.  02/06/19  Yes Clegg,  Amy D, NP  artificial tears (LACRILUBE) OINT ophthalmic ointment Place 1 application into both eyes at bedtime as needed for dry eyes.    Yes [provider]  Blood Glucose Monitoring Suppl (ONE TOUCH ULTRA MINI) w/Device KIT Use as directed twice a day 12/25/18  Yes Koberlein, Junell C, MD  carvedilol (COREG) 12.5 MG tablet Take 1 tablet (12.5 mg total) by mouth 2 (two) times daily with a meal. 02/06/19  Yes Clegg, Amy D, NP  guaiFENesin (MUCINEX) 600 MG 12 hr tablet Take 2 tablets (1,200 mg total) by mouth 2 (two) times daily. 02/27/19  Yes Florencia Reasons, MD  Hydroactive Dressings (DUODERM HYDROACTIVE) GEL duoderm or generic equivalent gel dressing. 4cmx4cm. Apply to wound q 3 days until healed. 09/13/18  Yes Koberlein, Junell C, MD  HYDROcodone-acetaminophen (NORCO) 10-325 MG tablet TAKE 1 TABLET BY MOUTH EVERY 6 HOURS AS NEEDED FOR PAIN MUST LAST 30 DAYS Patient taking  differently: Take 1 tablet by mouth every 6 (six) hours as needed for moderate pain.  02/07/19  Yes Bayard Hugger, NP  hydroxypropyl methylcellulose / hypromellose (ISOPTO TEARS / GONIOVISC) 2.5 % ophthalmic solution Place 1 drop into both eyes 3 (three) times daily as needed for dry eyes.   Yes [provider]  Insulin Pen Needle 31G X 8 MM MISC 1 Units by Does not apply route 2 (two) times a day. 02/11/19  Yes Koberlein, Steele Berg, MD  isosorbide mononitrate (IMDUR) 30 MG 24 hr tablet Take 1 tablet (30 mg total) by mouth daily. 02/06/19  Yes Clegg, Amy D, NP  Lancets (ONETOUCH ULTRASOFT) lancets Use as instructed 12/25/18  Yes Koberlein, Junell C, MD  LANTUS 100 UNIT/ML injection INJECT 45 UNITS INTO THE SKIN AT BEDTIME FOR 120 DOSES Patient taking differently: Inject 45 Units into the skin at bedtime.  12/13/18  Yes Koberlein, Junell C, MD  nystatin (MYCOSTATIN/NYSTOP) powder APPLY  1  GRAM TOPICALLY TWICE DAILY AS NEEDED Patient taking differently: Apply 1 g topically 2 (two) times daily as needed (rash).  04/08/18  Yes  Meredith Staggers, MD  ONE TOUCH ULTRA TEST test strip Use as instructed twice a day 12/25/18  Yes Koberlein, Junell C, MD  potassium chloride SA (K-DUR) 20 MEQ tablet Take 2 tablets (40 mEq total) by mouth 2 (two) times daily. 02/06/19  Yes Clegg, Amy D, NP  tiZANidine (ZANAFLEX) 4 MG tablet TAKE 1 TABLET BY MOUTH FOUR TIMES DAILY AS NEEDED FOR MUSCLE SPASMS Patient taking differently: Take 4 mg by mouth 4 (four) times daily as needed for muscle spasms.  03/17/19  Yes Meredith Staggers, MD  topiramate (TOPAMAX) 100 MG tablet Take 1 tablet (100 mg total) by mouth at bedtime. 10/09/18  Yes Meredith Staggers, MD  torsemide (DEMADEX) 20 MG tablet TAKE 3 TABLETS (60 MG TOTAL) BY MOUTH 2 TIMES DAILY. Patient taking differently: Take 60 mg by mouth 2 (two) times daily.  02/06/19  Yes [name], MD    Family History Family History  Problem Relation Age of Onset   CAD Father    Hypertension Mother    Cancer Other        multiple relatives   Diabetes Maternal Grandmother    Diabetes Maternal Aunt    Leukemia Cousin    Leukemia Maternal Grandfather     Social History Social History   Tobacco Use   Smoking status: Former Smoker    Packs/day: 0.25    Years: 20.00    Pack years: 5.00    Types: Cigarettes   Smokeless tobacco: Current User   Tobacco comment: Rare tobacco.  "[name]".  Substance Use Topics   Alcohol use: No    Alcohol/week: 0.0 standard drinks   Drug use: No     Allergies   Morphine and related, Novocain [procaine], Carbamazepine, Codeine, Gabapentin, Ivp dye [iodinated diagnostic agents], Other, and Prednisone   Review of Systems Review of Systems  Unable to perform ROS: Mental status change     Physical Exam Updated Vital Signs BP 130/60    Pulse 71    Temp 98.2 F (36.8 C) (Oral)    Resp (!) 21    Wt (!) 169.4 kg    SpO2 95%    BMI 53.59 kg/m   Physical Exam Vitals signs and nursing note reviewed.  HENT:     Head: Atraumatic.    Neck:     Musculoskeletal: Neck  supple.  Cardiovascular:     Rate and Rhythm: Normal rate.  Pulmonary:     Effort: Pulmonary effort is normal.     Breath sounds: Wheezing present.  Musculoskeletal:     Right lower leg: Edema present.     Left lower leg: Edema present.  Skin:    General: Skin is warm.  Neurological:     Mental Status: He is alert. He is disoriented.      ED Treatments / Results  Labs (all labs ordered are listed, but only abnormal results are displayed) Labs Reviewed  COMPREHENSIVE METABOLIC PANEL - Abnormal; Notable for the following components:      Result Value   CO2 36 (*)    Glucose, Bld 190 (*)    BUN 25 (*)    Creatinine, Ser 2.34 (*)    Albumin 3.2 (*)    Alkaline Phosphatase 259 (*)    GFR calc non Af Amer 30 (*)    GFR calc Af Amer 34 (*)    All other components within normal limits  CBC WITH DIFFERENTIAL/PLATELET - Abnormal; Notable for the following components:   RBC 3.93 (*)    Hemoglobin 11.5 (*)    MCV 107.4 (*)    MCHC 27.3 (*)    Abs Immature Granulocytes 0.12 (*)    All other components within normal limits  URINALYSIS, ROUTINE W REFLEX MICROSCOPIC - Abnormal; Notable for the following components:   Hgb urine dipstick SMALL (*)    Nitrite POSITIVE (*)    Leukocytes,Ua MODERATE (*)    Bacteria, UA RARE (*)    All other components within normal limits  BRAIN NATRIURETIC PEPTIDE - Abnormal; Notable for the following components:   B Natriuretic Peptide 282.0 (*)    All other components within normal limits  BLOOD GAS, VENOUS - Abnormal; Notable for the following components:   pH, Ven 7.201 (*)    pCO2, Ven 101 (*)    pO2, Ven 53.9 (*)    Bicarbonate 38.2 (*)    Acid-Base Excess 7.1 (*)    All other components within normal limits  GAMMA GT - Abnormal; Notable for the following components:   GGT 65 (*)    All other components within normal limits  BLOOD GAS, VENOUS - Abnormal; Notable for the following components:   pCO2, Ven  65.3 (*)    pO2, Ven 55.4 (*)    Bicarbonate 37.7 (*)    Acid-Base Excess 10.5 (*)    All other components within normal limits  CBC - Abnormal; Notable for the following components:   RBC 3.84 (*)    Hemoglobin 11.3 (*)    MCV 103.9 (*)    MCHC 28.3 (*)    All other components within normal limits  COMPREHENSIVE METABOLIC PANEL - Abnormal; Notable for the following components:   Glucose, Bld 160 (*)    BUN 25 (*)    Creatinine, Ser 2.37 (*)    Calcium 8.8 (*)    Albumin 3.0 (*)    Alkaline Phosphatase 250 (*)    GFR calc non Af Amer 29 (*)    GFR calc Af Amer 34 (*)    All other components within normal limits  GLUCOSE, CAPILLARY - Abnormal; Notable for the following components:   Glucose-Capillary 149 (*)    All other components within normal limits  GLUCOSE, CAPILLARY - Abnormal; Notable for the following components:   Glucose-Capillary 155 (*)    All other components within normal limits  TROPONIN  I (HIGH SENSITIVITY) - Abnormal; Notable for the following components:   Troponin I (High Sensitivity) 144 (*)    All other components within normal limits  TROPONIN I (HIGH SENSITIVITY) - Abnormal; Notable for the following components:   Troponin I (High Sensitivity) 225 (*)    All other components within normal limits  SARS CORONAVIRUS 2 (HOSPITAL ORDER, [place] [facility])  MRSA PCR SCREENING  URINE CULTURE  PROTIME-INR  ETHANOL  SAVE SMEAR (SSMR)  MAGNESIUM  LACTATE DEHYDROGENASE  PATHOLOGIST SMEAR REVIEW    EKG None  Radiology Ct Head Wo Contrast  Result Date: 03/21/2019 CLINICAL DATA:  Altered level of consciousness, shortness of Breath EXAM: CT HEAD WITHOUT CONTRAST TECHNIQUE: Contiguous axial images were obtained from the base of the skull through the vertex without intravenous contrast. COMPARISON:  04/30/2018 FINDINGS: Brain: No acute intracranial abnormality. Specifically, no hemorrhage, hydrocephalus, mass lesion, acute infarction, or  significant intracranial injury. Stable encephalomalacia at the right CP angle and overlying craniectomy defect. Vascular: No hyperdense vessel or unexpected calcification. Skull: No acute calvarial abnormality. Sinuses/Orbits: Visualized paranasal sinuses and mastoids clear. Orbital soft tissues unremarkable. Other: Prior right temporal occipital craniectomy. IMPRESSION: No acute intracranial abnormality. Electronically Signed   By: Rolm Baptise M.D.   On: 03/21/2019 23:17   Dg Chest Port 1 View  Result Date: 03/21/2019 CLINICAL DATA:  Shortness of breath and lethargy EXAM: PORTABLE CHEST 1 VIEW COMPARISON:  February 23, 2019. FINDINGS: There is cardiomegaly with pulmonary venous hypertension. There is slight interstitial edema. There is no consolidation. Patient is status post coronary artery bypass grafting. No bone lesions. IMPRESSION: Pulmonary vascular congestion with mild interstitial edema. There is felt to be a degree of congestive heart failure. No consolidation. Cardiomegaly is stable compared to most recent study. Electronically Signed   By: Lowella Grip III M.D.   On: 03/21/2019 21:44    Procedures .Critical Care Performed by: Varney Biles, MD Authorized by: Varney Biles, MD   Critical care provider statement:    Critical care time (minutes):  45   Critical care was necessary to treat or prevent imminent or life-threatening deterioration of the following conditions:  CNS failure or compromise and respiratory failure   Critical care was time spent personally by me on the following activities:  Discussions with consultants, evaluation of patient's response to treatment, examination of patient, ordering and performing treatments and interventions, ordering and review of laboratory studies, ordering and review of radiographic studies, pulse oximetry, re-evaluation of patient's condition, obtaining history from patient or surrogate and review of old charts   (including critical care  time)  Medications Ordered in ED Medications  amiodarone (PACERONE) tablet 100 mg (100 mg Oral Given 03/22/19 0954)  carvedilol (COREG) tablet 12.5 mg (12.5 mg Oral Given 03/22/19 0952)  isosorbide mononitrate (IMDUR) 24 hr tablet 30 mg (30 mg Oral Given 03/22/19 0954)  torsemide (DEMADEX) tablet 100 mg (100 mg Oral Given 03/22/19 1142)  insulin glargine (LANTUS) injection 40 Units (has no administration in time range)  apixaban (ELIQUIS) tablet 5 mg (5 mg Oral Given 03/22/19 0953)  tiZANidine (ZANAFLEX) tablet 4 mg (has no administration in time range)  topiramate (TOPAMAX) tablet 100 mg (has no administration in time range)  potassium chloride SA (K-DUR) CR tablet 40 mEq (40 mEq Oral Given 03/22/19 0955)  artificial tears (LACRILUBE) ophthalmic ointment 1 application (has no administration in time range)  polyvinyl alcohol (LIQUIFILM TEARS) 1.4 % ophthalmic solution 1 drop (has no administration in time range)  nystatin (MYCOSTATIN/NYSTOP) topical powder 1 g (has no administration in time range)  sodium chloride flush (NS) 0.9 % injection 3 mL (3 mLs Intravenous Not Given 03/22/19 1142)  sodium chloride flush (NS) 0.9 % injection 3 mL (has no administration in time range)  0.9 %  sodium chloride infusion (has no administration in time range)  insulin aspart (novoLOG) injection 0-5 Units (has no administration in time range)  insulin aspart (novoLOG) injection 0-20 Units (4 Units Subcutaneous Given 03/22/19 1309)  Chlorhexidine Gluconate Cloth 2 % PADS 6 each (6 each Topical Given 03/22/19 0800)  chlorhexidine (PERIDEX) 0.12 % solution 15 mL (15 mLs Mouth Rinse Not Given 03/22/19 0956)  MEDLINE mouth rinse (15 mLs Mouth Rinse Not Given 03/22/19 1310)  ipratropium-albuterol (DUONEB) 0.5-2.5 (3) MG/3ML nebulizer solution 3 mL (has no administration in time range)  albuterol (VENTOLIN HFA) 108 (90 Base) MCG/ACT inhaler 6 puff (6 puffs Inhalation Given 03/21/19 2221)  sodium chloride 0.9 % bolus 500 mL (0 mLs  Intravenous Stopped 03/22/19 0000)  furosemide (LASIX) injection 60 mg (60 mg Intravenous Given 03/22/19 0016)     Initial Impression / Assessment and Plan / ED Course  I have reviewed the triage vital signs and the nursing notes.  Pertinent labs & imaging results that were available during my care of the patient were reviewed by me and considered in my medical decision making (see chart for details).        58 year old male comes in a chief complaint of lethargy.  According to the signout note he has been altered for the last few days and getting weaker and sleepier. Patient has multiple medical problems including CHF, CAD, COPD, CKD.  Patient has required admission in the past for hypercapnic respiratory failure, and on my evaluation it appears that he is again having acute hypercapnic respiratory failure.  Additionally there is also complains of worsening leg swelling which is likely because of his worsening CHF.  Venous blood gas does show respiratory acidosis.  COVID test is negative.  BNP is elevated.  Chest x-ray showing vascular congestion and patient has been given IV Lasix.  He has been put on BiPAP for his hypercapnic respiratory failure. He is alert enough where we feel comfortable admitting him to stepdown.  Final Clinical Impressions(s) / ED Diagnoses   Final diagnoses:  Acute hypercapnic respiratory failure ([place])  Acute congestive heart failure, unspecified heart failure type [facility])    ED Discharge Orders    None       Varney Biles, MD 03/22/19 1605

## 2019-03-21 NOTE — Progress Notes (Signed)
RT placed patient on BIPAP. Patient is tolerating well.

## 2019-03-21 NOTE — ED Triage Notes (Addendum)
Patient arrived by [facility] with initial c/o lethargy and SHOB since 0530 this morning. Patient arrives drowsy, falls asleep during answering questions, slow to respond to commands answers questions appropriately. EMS also reports bilateral weeping edema in lower extremities.

## 2019-03-22 ENCOUNTER — Other Ambulatory Visit: Payer: Self-pay

## 2019-03-22 ENCOUNTER — Encounter (HOSPITAL_COMMUNITY): Payer: Self-pay | Admitting: *Deleted

## 2019-03-22 DIAGNOSIS — N184 Chronic kidney disease, stage 4 (severe): Secondary | ICD-10-CM | POA: Diagnosis present

## 2019-03-22 DIAGNOSIS — Z1624 Resistance to multiple antibiotics: Secondary | ICD-10-CM | POA: Diagnosis not present

## 2019-03-22 DIAGNOSIS — I130 Hypertensive heart and chronic kidney disease with heart failure and stage 1 through stage 4 chronic kidney disease, or unspecified chronic kidney disease: Secondary | ICD-10-CM | POA: Diagnosis present

## 2019-03-22 DIAGNOSIS — I639 Cerebral infarction, unspecified: Secondary | ICD-10-CM | POA: Diagnosis present

## 2019-03-22 DIAGNOSIS — J9602 Acute respiratory failure with hypercapnia: Secondary | ICD-10-CM | POA: Diagnosis present

## 2019-03-22 DIAGNOSIS — J9690 Respiratory failure, unspecified, unspecified whether with hypoxia or hypercapnia: Secondary | ICD-10-CM | POA: Diagnosis present

## 2019-03-22 DIAGNOSIS — J9621 Acute and chronic respiratory failure with hypoxia: Secondary | ICD-10-CM | POA: Diagnosis present

## 2019-03-22 DIAGNOSIS — J9622 Acute and chronic respiratory failure with hypercapnia: Secondary | ICD-10-CM | POA: Diagnosis present

## 2019-03-22 DIAGNOSIS — M109 Gout, unspecified: Secondary | ICD-10-CM | POA: Diagnosis present

## 2019-03-22 DIAGNOSIS — I480 Paroxysmal atrial fibrillation: Secondary | ICD-10-CM | POA: Diagnosis present

## 2019-03-22 DIAGNOSIS — M797 Fibromyalgia: Secondary | ICD-10-CM | POA: Diagnosis present

## 2019-03-22 DIAGNOSIS — N390 Urinary tract infection, site not specified: Secondary | ICD-10-CM | POA: Diagnosis not present

## 2019-03-22 DIAGNOSIS — G9341 Metabolic encephalopathy: Secondary | ICD-10-CM | POA: Diagnosis present

## 2019-03-22 DIAGNOSIS — I5033 Acute on chronic diastolic (congestive) heart failure: Secondary | ICD-10-CM | POA: Diagnosis present

## 2019-03-22 DIAGNOSIS — R0602 Shortness of breath: Secondary | ICD-10-CM | POA: Diagnosis present

## 2019-03-22 DIAGNOSIS — Z20828 Contact with and (suspected) exposure to other viral communicable diseases: Secondary | ICD-10-CM | POA: Diagnosis present

## 2019-03-22 DIAGNOSIS — E662 Morbid (severe) obesity with alveolar hypoventilation: Secondary | ICD-10-CM | POA: Diagnosis present

## 2019-03-22 DIAGNOSIS — I25810 Atherosclerosis of coronary artery bypass graft(s) without angina pectoris: Secondary | ICD-10-CM | POA: Diagnosis present

## 2019-03-22 DIAGNOSIS — J441 Chronic obstructive pulmonary disease with (acute) exacerbation: Secondary | ICD-10-CM | POA: Diagnosis present

## 2019-03-22 DIAGNOSIS — E1122 Type 2 diabetes mellitus with diabetic chronic kidney disease: Secondary | ICD-10-CM | POA: Diagnosis present

## 2019-03-22 DIAGNOSIS — I2510 Atherosclerotic heart disease of native coronary artery without angina pectoris: Secondary | ICD-10-CM | POA: Diagnosis present

## 2019-03-22 DIAGNOSIS — N179 Acute kidney failure, unspecified: Secondary | ICD-10-CM | POA: Diagnosis not present

## 2019-03-22 DIAGNOSIS — I252 Old myocardial infarction: Secondary | ICD-10-CM | POA: Diagnosis not present

## 2019-03-22 DIAGNOSIS — I248 Other forms of acute ischemic heart disease: Secondary | ICD-10-CM | POA: Diagnosis not present

## 2019-03-22 DIAGNOSIS — Z6841 Body Mass Index (BMI) 40.0 and over, adult: Secondary | ICD-10-CM | POA: Diagnosis not present

## 2019-03-22 DIAGNOSIS — Z9981 Dependence on supplemental oxygen: Secondary | ICD-10-CM | POA: Diagnosis not present

## 2019-03-22 LAB — TROPONIN I (HIGH SENSITIVITY)
Troponin I (High Sensitivity): 144 ng/L (ref <18)
Troponin I (High Sensitivity): 225 ng/L

## 2019-03-22 LAB — GLUCOSE, CAPILLARY
Glucose-Capillary: 149 mg/dL — ABNORMAL HIGH (ref 70–99)
Glucose-Capillary: 155 mg/dL — ABNORMAL HIGH (ref 70–99)
Glucose-Capillary: 189 mg/dL — ABNORMAL HIGH (ref 70–99)
Glucose-Capillary: 165 mg/dL — ABNORMAL HIGH (ref 70–99)

## 2019-03-22 LAB — COMPREHENSIVE METABOLIC PANEL
ALT: 23 U/L (ref 0–44)
AST: 20 U/L (ref 15–41)
Albumin: 3 g/dL — ABNORMAL LOW (ref 3.5–5.0)
Alkaline Phosphatase: 250 U/L — ABNORMAL HIGH (ref 38–126)
Anion gap: 14 (ref 5–15)
BUN: 25 mg/dL — ABNORMAL HIGH (ref 6–20)
CO2: 32 mmol/L (ref 22–32)
Calcium: 8.8 mg/dL — ABNORMAL LOW (ref 8.9–10.3)
Chloride: 98 mmol/L (ref 98–111)
Creatinine, Ser: 2.37 mg/dL — ABNORMAL HIGH (ref 0.61–1.24)
GFR calc Af Amer: 34 mL/min — ABNORMAL LOW (ref >60)
GFR calc non Af Amer: 29 mL/min — ABNORMAL LOW (ref >60)
Glucose, Bld: 160 mg/dL — ABNORMAL HIGH (ref 70–99)
Potassium: 3.8 mmol/L (ref 3.5–5.1)
Sodium: 144 mmol/L (ref 135–145)
Total Bilirubin: 0.5 mg/dL (ref 0.3–1.2)
Total Protein: 7.1 g/dL (ref 6.5–8.1)

## 2019-03-22 LAB — BLOOD GAS, VENOUS
Acid-Base Excess: 10.5 mmol/L — ABNORMAL HIGH (ref 0.0–2.0)
Bicarbonate: 37.7 mmol/L — ABNORMAL HIGH (ref 20.0–28.0)
O2 Saturation: 91 %
Patient temperature: 98.6
pCO2, Ven: 65.3 mmHg — ABNORMAL HIGH (ref 44.0–60.0)
pH, Ven: 7.38 (ref 7.250–7.430)
pO2, Ven: 55.4 mmHg — ABNORMAL HIGH (ref 32.0–45.0)

## 2019-03-22 LAB — VITAMIN B12: Vitamin B-12: 562 pg/mL (ref 180–914)

## 2019-03-22 LAB — CBC
HCT: 39.9 % (ref 39.0–52.0)
Hemoglobin: 11.3 g/dL — ABNORMAL LOW (ref 13.0–17.0)
MCH: 29.4 pg (ref 26.0–34.0)
MCHC: 28.3 g/dL — ABNORMAL LOW (ref 30.0–36.0)
MCV: 103.9 fL — ABNORMAL HIGH (ref 80.0–100.0)
Platelets: 178 10*3/uL (ref 150–400)
RBC: 3.84 MIL/uL — ABNORMAL LOW (ref 4.22–5.81)
RDW: 13.2 % (ref 11.5–15.5)
WBC: 8.3 10*3/uL (ref 4.0–10.5)
nRBC: 0 % (ref 0.0–0.2)

## 2019-03-22 LAB — MAGNESIUM: Magnesium: 2.1 mg/dL (ref 1.7–2.4)

## 2019-03-22 LAB — LACTATE DEHYDROGENASE: LDH: 144 U/L (ref 98–192)

## 2019-03-22 LAB — AMMONIA: Ammonia: 30 umol/L (ref 9–35)

## 2019-03-22 LAB — GAMMA GT: GGT: 65 U/L — ABNORMAL HIGH (ref 7–50)

## 2019-03-22 LAB — MRSA PCR SCREENING: MRSA by PCR: NEGATIVE

## 2019-03-22 LAB — TSH: TSH: 0.57 u[IU]/mL (ref 0.350–4.500)

## 2019-03-22 LAB — SAVE SMEAR(SSMR), FOR PROVIDER SLIDE REVIEW

## 2019-03-22 MED ORDER — AMIODARONE HCL 100 MG PO TABS
100.0000 mg | ORAL_TABLET | Freq: Every day | ORAL | Status: DC
  Administered 2019-03-22 – 2019-03-27 (×6): 100 mg via ORAL
  Filled 2019-03-22 (×6): qty 1

## 2019-03-22 MED ORDER — NYSTATIN 100000 UNIT/GM EX POWD
1.0000 g | Freq: Two times a day (BID) | CUTANEOUS | Status: DC | PRN
  Filled 2019-03-22: qty 15

## 2019-03-22 MED ORDER — SODIUM CHLORIDE 0.9 % IV SOLN
1.0000 g | INTRAVENOUS | Status: DC
  Administered 2019-03-22 – 2019-03-25 (×4): 1 g via INTRAVENOUS
  Filled 2019-03-22 (×4): qty 1
  Filled 2019-03-22: qty 10

## 2019-03-22 MED ORDER — ALBUTEROL SULFATE HFA 108 (90 BASE) MCG/ACT IN AERS
2.0000 | INHALATION_SPRAY | Freq: Four times a day (QID) | RESPIRATORY_TRACT | Status: DC | PRN
  Filled 2019-03-22: qty 6.7

## 2019-03-22 MED ORDER — IPRATROPIUM-ALBUTEROL 0.5-2.5 (3) MG/3ML IN SOLN: 3.0000 mL | Freq: Four times a day (QID) | RESPIRATORY_TRACT | Status: DC

## 2019-03-22 MED ORDER — IPRATROPIUM-ALBUTEROL 0.5-2.5 (3) MG/3ML IN SOLN
3.0000 mL | Freq: Two times a day (BID) | RESPIRATORY_TRACT | Status: DC
  Administered 2019-03-22: 3 mL via RESPIRATORY_TRACT
  Filled 2019-03-22: qty 3

## 2019-03-22 MED ORDER — ORAL CARE MOUTH RINSE
15.0000 mL | Freq: Two times a day (BID) | OROMUCOSAL | Status: DC
  Administered 2019-03-22 – 2019-03-25 (×2): 15 mL via OROMUCOSAL

## 2019-03-22 MED ORDER — INSULIN ASPART 100 UNIT/ML ~~LOC~~ SOLN
0.0000 [IU] | Freq: Every day | SUBCUTANEOUS | Status: DC
  Filled 2019-03-22: qty 0.05

## 2019-03-22 MED ORDER — APIXABAN 5 MG PO TABS
5.0000 mg | ORAL_TABLET | Freq: Two times a day (BID) | ORAL | Status: DC
  Administered 2019-03-22 – 2019-03-27 (×11): 5 mg via ORAL
  Filled 2019-03-22 (×11): qty 1

## 2019-03-22 MED ORDER — IPRATROPIUM-ALBUTEROL 0.5-2.5 (3) MG/3ML IN SOLN: 3.0000 mL | Freq: Four times a day (QID) | RESPIRATORY_TRACT | Status: DC | PRN

## 2019-03-22 MED ORDER — SODIUM CHLORIDE 0.9% FLUSH: 3.0000 mL | INTRAVENOUS | Status: DC | PRN

## 2019-03-22 MED ORDER — ISOSORBIDE MONONITRATE ER 30 MG PO TB24
30.0000 mg | ORAL_TABLET | Freq: Every day | ORAL | Status: DC
  Administered 2019-03-22 – 2019-03-27 (×6): 30 mg via ORAL
  Filled 2019-03-22 (×7): qty 1

## 2019-03-22 MED ORDER — SODIUM CHLORIDE 0.9% FLUSH
3.0000 mL | Freq: Two times a day (BID) | INTRAVENOUS | Status: DC
  Administered 2019-03-22 – 2019-03-27 (×9): 3 mL via INTRAVENOUS

## 2019-03-22 MED ORDER — POTASSIUM CHLORIDE CRYS ER 20 MEQ PO TBCR
40.0000 meq | EXTENDED_RELEASE_TABLET | Freq: Two times a day (BID) | ORAL | Status: DC
  Administered 2019-03-22 – 2019-03-25 (×7): 40 meq via ORAL
  Administered 2019-03-25: 20 meq via ORAL
  Administered 2019-03-26 – 2019-03-27 (×3): 40 meq via ORAL
  Filled 2019-03-22 (×11): qty 2

## 2019-03-22 MED ORDER — CHLORHEXIDINE GLUCONATE 0.12 % MT SOLN
15.0000 mL | Freq: Two times a day (BID) | OROMUCOSAL | Status: DC
  Administered 2019-03-25 – 2019-03-26 (×4): 15 mL via OROMUCOSAL
  Filled 2019-03-22 (×10): qty 15

## 2019-03-22 MED ORDER — POLYVINYL ALCOHOL 1.4 % OP SOLN
1.0000 [drp] | Freq: Three times a day (TID) | OPHTHALMIC | Status: DC | PRN
  Filled 2019-03-22: qty 15

## 2019-03-22 MED ORDER — TOPIRAMATE 100 MG PO TABS
100.0000 mg | ORAL_TABLET | Freq: Every day | ORAL | Status: DC
  Administered 2019-03-22: 100 mg via ORAL
  Filled 2019-03-22: qty 1

## 2019-03-22 MED ORDER — TORSEMIDE 100 MG PO TABS
100.0000 mg | ORAL_TABLET | Freq: Two times a day (BID) | ORAL | Status: DC
  Administered 2019-03-22 (×2): 100 mg via ORAL
  Filled 2019-03-22 (×3): qty 1

## 2019-03-22 MED ORDER — INSULIN ASPART 100 UNIT/ML ~~LOC~~ SOLN
0.0000 [IU] | Freq: Three times a day (TID) | SUBCUTANEOUS | Status: DC
  Administered 2019-03-22: 3 [IU] via SUBCUTANEOUS
  Administered 2019-03-22 (×2): 4 [IU] via SUBCUTANEOUS
  Administered 2019-03-23: 11 [IU] via SUBCUTANEOUS
  Administered 2019-03-23: 4 [IU] via SUBCUTANEOUS
  Administered 2019-03-23: 3 [IU] via SUBCUTANEOUS
  Administered 2019-03-24 (×2): 7 [IU] via SUBCUTANEOUS
  Administered 2019-03-25: 4 [IU] via SUBCUTANEOUS
  Administered 2019-03-25: 7 [IU] via SUBCUTANEOUS
  Administered 2019-03-25 – 2019-03-26 (×4): 4 [IU] via SUBCUTANEOUS
  Administered 2019-03-27: 3 [IU] via SUBCUTANEOUS
  Administered 2019-03-27: 7 [IU] via SUBCUTANEOUS
  Filled 2019-03-22: qty 0.2

## 2019-03-22 MED ORDER — TIZANIDINE HCL 4 MG PO TABS: 4.0000 mg | ORAL_TABLET | Freq: Four times a day (QID) | ORAL | Status: DC | PRN

## 2019-03-22 MED ORDER — ARTIFICIAL TEARS OPHTHALMIC OINT
1.0000 "application " | TOPICAL_OINTMENT | Freq: Every evening | OPHTHALMIC | Status: DC | PRN
  Administered 2019-03-23: 1 via OPHTHALMIC
  Filled 2019-03-22 (×2): qty 3.5

## 2019-03-22 MED ORDER — INSULIN GLARGINE 100 UNIT/ML ~~LOC~~ SOLN
40.0000 [IU] | Freq: Every day | SUBCUTANEOUS | Status: DC
  Administered 2019-03-22 – 2019-03-26 (×5): 40 [IU] via SUBCUTANEOUS
  Filled 2019-03-22 (×6): qty 0.4

## 2019-03-22 MED ORDER — CARVEDILOL 12.5 MG PO TABS
12.5000 mg | ORAL_TABLET | Freq: Two times a day (BID) | ORAL | Status: DC
  Administered 2019-03-22 – 2019-03-27 (×11): 12.5 mg via ORAL
  Filled 2019-03-22 (×12): qty 1

## 2019-03-22 MED ORDER — CHLORHEXIDINE GLUCONATE CLOTH 2 % EX PADS
6.0000 | MEDICATED_PAD | Freq: Every day | CUTANEOUS | Status: DC
  Administered 2019-03-22 – 2019-03-27 (×6): 6 via TOPICAL

## 2019-03-22 MED ORDER — SODIUM CHLORIDE 0.9 % IV SOLN: 250.0000 mL | INTRAVENOUS | Status: DC | PRN

## 2019-03-22 NOTE — Consult Note (Signed)
[facility] Nurse wound consult note  Reason for Consult: sacral wound Wound type: description in the flowsheet section is consistent with an unstageable PI. Measurement:To be provided by the bedside RN in the flowsheet section Wound bed: see flowsheet.  Awaiting photo of wound. Secure Chat has been "seen" by Dr. Loni Muse. [place] Drainage (amount, consistency, odor) To be provided by the bedside RN in the flowsheet section Periwound: see flowsheet Dressing procedure/placement/frequency:  Cleanse sacral wound with saline. Pat dry. Apply a hydrocolloid dressing Kellie Simmering [phone]) over the wound.  Change daily. Monitor the wound area(s) for worsening of condition such as: Signs/symptoms of infection,  Increase in size,  Development of or worsening of odor, Development of pain, or increased pain at the affected locations.  Notify the medical team if any of these develop.  Thank you for the consult. [place] nurse will not follow at this time.  Please re-consult the [facility] team if needed.  Val Riles, RN, MSN, CWOCN, CNS-BC, pager [phone]

## 2019-03-22 NOTE — Progress Notes (Addendum)
CRITICAL VALUE ALERT  Critical Value:  Troponin 144  Date & Time Notied:  03/22/2019 @ 0845  Provider Notified:  Dr. Florene Glen paged   Orders Received/Actions taken:new orders received

## 2019-03-22 NOTE — ED Notes (Signed)
Pt. Documented in error see note in chart.

## 2019-03-22 NOTE — Progress Notes (Signed)
RN called this Probation officer and stated that patient was refusing to continue to wear the Bipap despite education on its importance.  Pt. Continues to refuse.

## 2019-03-22 NOTE — ED Notes (Signed)
ED TO INPATIENT HANDOFF REPORT  Name/Age/Gender [name]. 58 y.o. male  Code Status Code Status History    Date Active Date Inactive Code Status Order ID Comments User Context   02/22/2019 0820 02/27/2019 1927 Full Code [id number]  Kerney Elbe, DO Inpatient   05/01/2018 0010 05/10/2018 1942 Full Code [id number]  Welford Roche, MD ED   01/28/2017 1328 02/03/2017 1658 Full Code [id number]  Elwin Mocha, MD ED   02/16/2016 1655 02/23/2016 2104 Full Code [id number]  Raylene Miyamoto, MD Inpatient   10/22/2012 2005 11/12/2012 1754 Full Code [id number]  Flora Lipps Inpatient   10/12/2012 0058 10/22/2012 2005 Full Code [id number]  [name], MD Inpatient   Advance Care Planning Activity      Home/SNF/Other Home  Chief Complaint Lethargy and Wheezing  Level of Care/Admitting Diagnosis ED Disposition    ED Disposition Condition [facility]: [place] [100102]  Level of Care: Stepdown [14]  Admit to SDU based on following criteria: Respiratory Distress:  Frequent assessment and/or intervention to maintain adequate ventilation/respiration, pulmonary toilet, and respiratory treatment.  Covid Evaluation: Confirmed COVID Negative  Diagnosis: Respiratory failure [facility]) [[id number]]  Admitting Physician: Eston Esters  Attending Physician: Gwynne Edinger [[id number]]  Estimated length of stay: past midnight tomorrow  Certification:: I certify this patient will need inpatient services for at least 2 midnights  PT Class (Do Not Modify): Inpatient [101]  PT Acc Code (Do Not Modify): Private [1]       Medical History Past Medical History:  Diagnosis Date  . Brain tumor ([place])    Takes Topamax so patient will not have headaches  . Bruises easily   . CAD (coronary artery disease)    Cath 09, 99% LAD  . Cardiomyopathy, ischemic    Ischemic  . CHF (congestive heart failure) ([place])   . Cyst near coccyx   .  Cyst near tailbone   . Diabetes mellitus without complication (HCC)    borderline  . Edema of both legs    Takes Lasix  . Fibromyalgia   . Gout   . Headache(784.0)    with lights  . Hypertension    dr Percival Spanish  . Kidney stones   . Myocardial infarction ([place])   . Obesity   . Pneumonia    hx of  . Shortness of breath   . Stroke [facility])    Affected vision, walking, and facial drooping on right face    Allergies Allergies  Allergen Reactions  . Morphine And Related Anaphylaxis    "makes me stop breathing"  . Novocain [Procaine] Anaphylaxis  . Carbamazepine     shaking  . Codeine Other (See Comments)    "makes heart race" per patient  . Gabapentin Other (See Comments)    Blood in urine  . Ivp Dye [Iodinated Diagnostic Agents]     Passing out  . Other     Steroids makes hearts race  . Prednisone     Heart racing    IV Location/Drains/Wounds Patient Lines/Drains/Airways Status   Active Line/Drains/Airways    Name:   Placement date:   Placement time:   Site:   Days:   Peripheral IV 03/22/19 Right Hand   03/22/19    0302    Hand   less than 1   External Urinary Catheter   03/21/19    2109    -   1  Labs/Imaging Results for orders placed or performed during the hospital encounter of 03/21/19 (from the past 48 hour(s))  Comprehensive metabolic panel     Status: Abnormal   Collection Time: 03/21/19  9:28 PM  Result Value Ref Range   Sodium 143 135 - 145 mmol/L   Potassium 4.2 3.5 - 5.1 mmol/L   Chloride 98 98 - 111 mmol/L   CO2 36 (H) 22 - 32 mmol/L   Glucose, Bld 190 (H) 70 - 99 mg/dL   BUN 25 (H) 6 - 20 mg/dL   Creatinine, Ser 2.34 (H) 0.61 - 1.24 mg/dL   Calcium 8.9 8.9 - 10.3 mg/dL   Total Protein 7.9 6.5 - 8.1 g/dL   Albumin 3.2 (L) 3.5 - 5.0 g/dL   AST 19 15 - 41 U/L   ALT 23 0 - 44 U/L   Alkaline Phosphatase 259 (H) 38 - 126 U/L   Total Bilirubin 0.5 0.3 - 1.2 mg/dL   GFR calc non Af Amer 30 (L) >60 mL/min   GFR calc Af Amer 34 (L) >60 mL/min    Anion gap 9 5 - 15    Comment: Performed at [facility], [place] [address], [place], [place] [address]  CBC with Differential     Status: Abnormal   Collection Time: 03/21/19  9:28 PM  Result Value Ref Range   WBC 7.5 4.0 - 10.5 K/uL   RBC 3.93 (L) 4.22 - 5.81 MIL/uL   Hemoglobin 11.5 (L) 13.0 - 17.0 g/dL   HCT 42.2 39.0 - 52.0 %   MCV 107.4 (H) 80.0 - 100.0 fL   MCH 29.3 26.0 - 34.0 pg   MCHC 27.3 (L) 30.0 - 36.0 g/dL   RDW 13.2 11.5 - 15.5 %   Platelets 205 150 - 400 K/uL   nRBC 0.0 0.0 - 0.2 %   Neutrophils Relative % 65 %   Neutro Abs 5.0 1.7 - 7.7 K/uL   Lymphocytes Relative 13 %   Lymphs Abs 1.0 0.7 - 4.0 K/uL   Monocytes Relative 12 %   Monocytes Absolute 0.9 0.1 - 1.0 K/uL   Eosinophils Relative 7 %   Eosinophils Absolute 0.5 0.0 - 0.5 K/uL   Basophils Relative 1 %   Basophils Absolute 0.1 0.0 - 0.1 K/uL   Immature Granulocytes 2 %   Abs Immature Granulocytes 0.12 (H) 0.00 - 0.07 K/uL    Comment: Performed at [facility], [place] [address], [place], [place] [address]  Protime-INR     Status: None   Collection Time: 03/21/19  9:28 PM  Result Value Ref Range   Prothrombin Time 14.2 11.4 - 15.2 seconds   INR 1.1 0.8 - 1.2    Comment: (NOTE) INR goal varies based on device and disease states. Performed at [facility], [place] [address], [place], [place] [address]   Ethanol     Status: None   Collection Time: 03/21/19  9:28 PM  Result Value Ref Range   Alcohol, Ethyl (B) <10 <10 mg/dL    Comment: (NOTE) Lowest detectable limit for serum alcohol is 10 mg/dL. For medical purposes only. Performed at [facility], [place] [address], [place], [place] [address]   Urinalysis, Routine w reflex microscopic     Status: Abnormal   Collection Time: 03/21/19  9:29 PM  Result Value Ref Range   Color, Urine YELLOW YELLOW   APPearance CLEAR CLEAR   Specific Gravity, Urine 1.009 1.005 - 1.030  pH 5.0 5.0 -  8.0   Glucose, UA NEGATIVE NEGATIVE mg/dL   Hgb urine dipstick SMALL (A) NEGATIVE   Bilirubin Urine NEGATIVE NEGATIVE   Ketones, ur NEGATIVE NEGATIVE mg/dL   Protein, ur NEGATIVE NEGATIVE mg/dL   Nitrite POSITIVE (A) NEGATIVE   Leukocytes,Ua MODERATE (A) NEGATIVE   RBC / HPF 0-5 0 - 5 RBC/hpf   WBC, UA 21-50 0 - 5 WBC/hpf   Bacteria, UA RARE (A) NONE SEEN   Mucus PRESENT     Comment: Performed at [facility], [place] [address] [place], [place] [address]  Brain natriuretic peptide     Status: Abnormal   Collection Time: 03/21/19  9:30 PM  Result Value Ref Range   B Natriuretic Peptide 282.0 (H) 0.0 - 100.0 pg/mL    Comment: Performed at [facility], [place] [address], [place], [place] [address]  SARS Coronavirus 2 [facility] order, Performed in [facility] hospital lab)     Status: None   Collection Time: 03/21/19  9:30 PM  Result Value Ref Range   SARS Coronavirus 2 NEGATIVE NEGATIVE    Comment: (NOTE) If result is NEGATIVE SARS-CoV-2 target nucleic acids are NOT DETECTED. The SARS-CoV-2 RNA is generally detectable in upper and lower  respiratory specimens during the acute phase of infection. The lowest  concentration of SARS-CoV-2 viral copies this assay can detect is 250  copies / mL. A negative result does not preclude SARS-CoV-2 infection  and should not be used as the sole basis for treatment or other  patient management decisions.  A negative result may occur with  improper specimen collection / handling, submission of specimen other  than nasopharyngeal swab, presence of viral mutation(s) within the  areas targeted by this assay, and inadequate number of viral copies  (<250 copies / mL). A negative result must be combined with clinical  observations, patient history, and epidemiological information. If result is POSITIVE SARS-CoV-2 target nucleic acids are DETECTED. The SARS-CoV-2 RNA is generally detectable in upper and lower   respiratory specimens dur ing the acute phase of infection.  Positive  results are indicative of active infection with SARS-CoV-2.  Clinical  correlation with patient history and other diagnostic information is  necessary to determine patient infection status.  Positive results do  not rule out bacterial infection or co-infection with other viruses. If result is PRESUMPTIVE POSTIVE SARS-CoV-2 nucleic acids MAY BE PRESENT.   A presumptive positive result was obtained on the submitted specimen  and confirmed on repeat testing.  While 2019 novel coronavirus  (SARS-CoV-2) nucleic acids may be present in the submitted sample  additional confirmatory testing may be necessary for epidemiological  and / or clinical management purposes  to differentiate between  SARS-CoV-2 and other Sarbecovirus currently known to infect humans.  If clinically indicated additional testing with an alternate test  methodology [phone]) is advised. The SARS-CoV-2 RNA is generally  detectable in upper and lower respiratory sp ecimens during the acute  phase of infection. The expected result is Negative. Fact Sheet for Patients:  StrictlyIdeas.no Fact Sheet for Healthcare Providers: BankingDealers.co.za This test is not yet approved or cleared by the [place] FDA and has been authorized for detection and/or diagnosis of SARS-CoV-2 by FDA under an Emergency Use Authorization (EUA).  This EUA will remain in effect (meaning this test can be used) for the duration of the COVID-19 declaration under Section 564(b)(1) of the Act, 21 U.S.C. section 360bbb-3(b)(1), unless the authorization is terminated or  revoked sooner. Performed at [facility], [place] [address] [place], [place] [address]   Blood gas, venous     Status: Abnormal   Collection Time: 03/21/19  9:46 PM  Result Value Ref Range   FIO2 21.00    pH, Ven 7.201 (L) 7.250 - 7.430   pCO2,  Ven 101 (HH) 44.0 - 60.0 mmHg    Comment: CRITICAL RESULT CALLED TO, READ BACK BY AND VERIFIED WITH: BOBBY BROOKS RN AT 2153 BY AMY RAY RRT RCP ON 03/21/2019    pO2, Ven 53.9 (H) 32.0 - 45.0 mmHg   Bicarbonate 38.2 (H) 20.0 - 28.0 mmol/L   Acid-Base Excess 7.1 (H) 0.0 - 2.0 mmol/L   O2 Saturation 86.4 %   Patient temperature 98.6    Collection site VEIN    Drawn by COLLECTED BY NURSE    Sample type VENOUS     Comment: Performed at [place] [facility] [address] [place], [place] [address]   Ct Head Wo Contrast  Result Date: 03/21/2019 CLINICAL DATA:  Altered level of consciousness, shortness of Breath EXAM: CT HEAD WITHOUT CONTRAST TECHNIQUE: Contiguous axial images were obtained from the base of the skull through the vertex without intravenous contrast. COMPARISON:  04/30/2018 FINDINGS: Brain: No acute intracranial abnormality. Specifically, no hemorrhage, hydrocephalus, mass lesion, acute infarction, or significant intracranial injury. Stable encephalomalacia at the right CP angle and overlying craniectomy defect. Vascular: No hyperdense vessel or unexpected calcification. Skull: No acute calvarial abnormality. Sinuses/Orbits: Visualized paranasal sinuses and mastoids clear. Orbital soft tissues unremarkable. Other: Prior right temporal occipital craniectomy. IMPRESSION: No acute intracranial abnormality. Electronically Signed   By: Rolm Baptise M.D.   On: 03/21/2019 23:17   Dg Chest Port 1 View  Result Date: 03/21/2019 CLINICAL DATA:  Shortness of breath and lethargy EXAM: PORTABLE CHEST 1 VIEW COMPARISON:  February 23, 2019. FINDINGS: There is cardiomegaly with pulmonary venous hypertension. There is slight interstitial edema. There is no consolidation. Patient is status post coronary artery bypass grafting. No bone lesions. IMPRESSION: Pulmonary vascular congestion with mild interstitial edema. There is felt to be a degree of congestive heart failure. No consolidation.  Cardiomegaly is stable compared to most recent study. Electronically Signed   By: Lowella Grip III M.D.   On: 03/21/2019 21:44    Pending Labs Unresulted Labs (From admission, onward)    Start     Ordered   03/21/19 2129  Urine culture  ONCE - STAT,   STAT     03/21/19 2129   Signed and Held  Comprehensive metabolic panel  Tomorrow morning,   R     Signed and Held   Signed and Held  Blood gas, venous  Tomorrow morning,   R     Signed and Held   Signed and Held  Gamma GT  Add-on,   R     Signed and Held   Signed and Held  Save Smear  Once,   R     Signed and Held   Signed and Herbalist smear review  Once,   R     Signed and Held   Signed and Held  Lactate dehydrogenase  Add-on,   R     Signed and Held          Vitals/Pain Today's Vitals   03/22/19 0100 03/22/19 0130 03/22/19 0311 03/22/19 0319  BP: (!) 153/84 (!) 158/69 (!) 170/66   Pulse: 79 82 82 85  Resp: 18 19 (!)  25 (!) 23  Temp:  97.9 F (36.6 C)    TempSrc:  Axillary    SpO2: 100% 100% 99% 96%    Isolation Precautions No active isolations  Medications Medications  albuterol (VENTOLIN HFA) 108 (90 Base) MCG/ACT inhaler 6 puff (6 puffs Inhalation Given 03/21/19 2221)  sodium chloride 0.9 % bolus 500 mL (0 mLs Intravenous Stopped 03/22/19 0000)  furosemide (LASIX) injection 60 mg (60 mg Intravenous Given 03/22/19 0016)    Mobility walks with device

## 2019-03-22 NOTE — Progress Notes (Signed)
Pt requested to be pulled off of bipap d/t inability to sleep. Pt taught about reason for bipap, still refuses to wear overnight. 4L [place] placed, as requested, no resp distress noted. Will continue to monitor.

## 2019-03-22 NOTE — H&P (Signed)
History and Physical    [name]. JAS:[id number] DOB: [birth year] DOA: 03/21/2019  PCP: Caren Macadam, MD  Patient coming from: home   Chief Complaint: confusion, shortness of breath  HPI: [name]. is a 58 y.o. male with medical history significant for cva with right sided facial droop, acoustic neuroma, CAD s/p cabg 2009, htn, gout, insulin-dependent dm, dCHF, paf s/p ablation on anticoagulation, ckd3/4, chronic hypoxic respiratory failure on 3 L [place] O2 at home, morbid obesity, who presents with above.   History, unfortunately, obtained mainly from ED staff as patient remains confused and unable to reach patient's wife.   Per report patient's wife alerted EMS for several days worsening shortness of breath, somnolence, and confusion. No reported chest pain or significant cough. Reportedly compliant with medications. On 3 L [place] O2 at home. Per report also has had worsening lower extremity swelling.  ED Course: lasix, bipap, labs, ct head, cxr  Review of Systems: As per HPI otherwise 10 point review of systems negative.    Past Medical History:  Diagnosis Date   Brain tumor [facility])    Takes Topamax so patient will not have headaches   Bruises easily    CAD (coronary artery disease)    Cath 09, 99% LAD   Cardiomyopathy, ischemic    Ischemic   CHF (congestive heart failure) (HCC)    Cyst near coccyx    Cyst near tailbone    Diabetes mellitus without complication (HCC)    borderline   Edema of both legs    Takes Lasix   Fibromyalgia    Gout    Headache(784.0)    with lights   Hypertension    dr hochrein   Kidney stones    Myocardial infarction ([place])    Obesity    Pneumonia    hx of   Shortness of breath    Stroke ([place])    Affected vision, walking, and facial drooping on right face    Past Surgical History:  Procedure Laterality Date   ACOUSTIC NEUROMA RESECTION N/A 10/11/2012   Procedure: ACOUSTIC NEUROMA  RESECTION;  Surgeon: Ascencion Dike, MD;  Location: [facility];  Service: ENT;  Laterality: N/A;   ANAL FISTULECTOMY     CARDIOVERSION N/A 05/08/2018   Procedure: CARDIOVERSION;  Surgeon: [name], MD;  Location: [place];  Service: Cardiovascular;  Laterality: N/A;   CORONARY ARTERY BYPASS GRAFT     LIMA to LAD 2009   CRANIOTOMY Right 11/20/2012   Procedure: CRANIOTOMY REPAIR DURAL/CENTRAL SPINAL FLUID LEAK;  Surgeon: [name], MD;  Location: [facility];  Service: Neurosurgery;  Laterality: Right;   EYE SURGERY     to coorect lid and double vision   MEDIAN RECTUS REPAIR Right 02/16/2016   Procedure: RIGHT LATERAL RECTUS RESECTION; SUPERIOR RECTUS RESECTION RIGHT EYE; RIGHT SUPERIOR RECTUS RECESSION; LATERAL TARSAL STRIP RIGHT LOWER EYELID;  Surgeon: [name], MD;  Location: [place];  Service: Ophthalmology;  Laterality: Right;   PLACEMENT OF LUMBAR DRAIN N/A 11/20/2012   Procedure: Attempted PLACEMENT OF LUMBAR DRAIN;  Surgeon: [name], MD;  Location: [place] [facility]UR[facility];  Service: Neurosurgery;  Laterality: N/A;   RETROSIGMOID CRANIECTOMY FOR TUMOR RESECTION Right 10/11/2012   Procedure: RETROSIGMOID CRANIECTOMY FOR TUMOR RESECTION;  Surgeon: [name], MD;  Location: [place] [facility];  Service: Neurosurgery;  Laterality: Right;  Craniotomy for acoustic neuroma   TEE WITHOUT CARDIOVERSION N/A 05/08/2018   Procedure: TRANSESOPHAGEAL ECHOCARDIOGRAM (TEE);  Surgeon: [name], MD;  Location: [facility] ENDOSCOPY;  Service: Cardiovascular;  Laterality: N/A;   TRACHEOSTOMY TUBE PLACEMENT N/A 10/11/2012   Procedure: TRACHEOSTOMY;  Surgeon: Ascencion Dike, MD;  Location: [facility];  Service: ENT;  Laterality: N/A;   [place]       reports that he has quit smoking. His smoking use included cigarettes. He has a 5.00 pack-year smoking history. He uses smokeless tobacco. He reports that he does not drink alcohol or use drugs.  Allergies  Allergen Reactions    Morphine And Related Anaphylaxis    "makes me stop breathing"   Novocain [Procaine] Anaphylaxis   Carbamazepine     shaking   Codeine Other (See Comments)    "makes heart race" per patient   Gabapentin Other (See Comments)    Blood in urine   Ivp Dye [Iodinated Diagnostic Agents]     Passing out   Other     Steroids makes hearts race   Prednisone     Heart racing    Family History  Problem Relation Age of Onset   CAD Father    Hypertension Mother    Cancer Other        multiple relatives   Diabetes Maternal Grandmother    Diabetes Maternal Aunt    Leukemia Cousin    Leukemia Maternal Grandfather     Prior to Admission medications   Medication Sig Start Date End Date Taking? Authorizing Provider  albuterol (VENTOLIN HFA) 108 (90 Base) MCG/ACT inhaler Inhale 2 puffs into the lungs every 6 (six) hours as needed for wheezing or shortness of breath. 03/14/19  Yes Koberlein, Junell C, MD  amiodarone (PACERONE) 100 MG tablet Take 1 tablet (100 mg total) by mouth daily. 02/06/19  Yes Clegg, Amy D, NP  apixaban (ELIQUIS) 5 MG TABS tablet Take 1 tablet (5 mg total) by mouth 2 (two) times daily. Patient taking differently: Take 5 mg by mouth daily.  02/06/19  Yes Clegg, Amy D, NP  artificial tears (LACRILUBE) OINT ophthalmic ointment Place 1 application into both eyes at bedtime as needed for dry eyes.    Yes [provider]  Blood Glucose Monitoring Suppl (ONE TOUCH ULTRA MINI) w/Device KIT Use as directed twice a day 12/25/18  Yes Koberlein, Junell C, MD  carvedilol (COREG) 12.5 MG tablet Take 1 tablet (12.5 mg total) by mouth 2 (two) times daily with a meal. 02/06/19  Yes Clegg, Amy D, NP  guaiFENesin (MUCINEX) 600 MG 12 hr tablet Take 2 tablets (1,200 mg total) by mouth 2 (two) times daily. 02/27/19  Yes Florencia Reasons, MD  Hydroactive Dressings (DUODERM HYDROACTIVE) GEL duoderm or generic equivalent gel dressing. 4cmx4cm. Apply to wound q 3 days until healed. 09/13/18  Yes  Koberlein, Steele Berg, MD  HYDROcodone-acetaminophen (NORCO) 10-325 MG tablet TAKE 1 TABLET BY MOUTH EVERY 6 HOURS AS NEEDED FOR PAIN MUST LAST 30 DAYS Patient taking differently: Take 1 tablet by mouth every 6 (six) hours as needed for moderate pain.  02/07/19  Yes Bayard Hugger, NP  hydroxypropyl methylcellulose / hypromellose (ISOPTO TEARS / GONIOVISC) 2.5 % ophthalmic solution Place 1 drop into both eyes 3 (three) times daily as needed for dry eyes.   Yes [provider]  Insulin Pen Needle 31G X 8 MM MISC 1 Units by Does not apply route 2 (two) times a day. 02/11/19  Yes Koberlein, Junell C, MD  isosorbide mononitrate (IMDUR) 30 MG 24 hr tablet Take 1 tablet (  30 mg total) by mouth daily. 02/06/19  Yes Clegg, Amy D, NP  Lancets (ONETOUCH ULTRASOFT) lancets Use as instructed 12/25/18  Yes Koberlein, Junell C, MD  LANTUS 100 UNIT/ML injection INJECT 45 UNITS INTO THE SKIN AT BEDTIME FOR 120 DOSES Patient taking differently: Inject 45 Units into the skin at bedtime.  12/13/18  Yes Koberlein, Junell C, MD  nystatin (MYCOSTATIN/NYSTOP) powder APPLY  1  GRAM TOPICALLY TWICE DAILY AS NEEDED Patient taking differently: Apply 1 g topically 2 (two) times daily as needed (rash).  04/08/18  Yes Meredith Staggers, MD  ONE TOUCH ULTRA TEST test strip Use as instructed twice a day 12/25/18  Yes Koberlein, Junell C, MD  potassium chloride SA (K-DUR) 20 MEQ tablet Take 2 tablets (40 mEq total) by mouth 2 (two) times daily. 02/06/19  Yes Clegg, Amy D, NP  tiZANidine (ZANAFLEX) 4 MG tablet TAKE 1 TABLET BY MOUTH FOUR TIMES DAILY AS NEEDED FOR MUSCLE SPASMS Patient taking differently: Take 4 mg by mouth 4 (four) times daily as needed for muscle spasms.  03/17/19  Yes Meredith Staggers, MD  topiramate (TOPAMAX) 100 MG tablet Take 1 tablet (100 mg total) by mouth at bedtime. 10/09/18  Yes Meredith Staggers, MD  torsemide (DEMADEX) 20 MG tablet TAKE 3 TABLETS (60 MG TOTAL) BY MOUTH 2 TIMES DAILY. Patient taking  differently: Take 60 mg by mouth 2 (two) times daily.  02/06/19  Yes [name], MD    Physical Exam: Vitals:   03/21/19 2202 03/21/19 2230 03/21/19 2344 03/22/19 0007  BP: (!) 154/59 (!) 162/66  (!) 150/73  Pulse: 75 72 75 65  Resp: (!) 21 (!) _0 Temp:      TempSrc:      SpO2: 92% 93% 99% 98%    Constitutional: morbidly obese, bipap on Head: right facial droop Eyes: Conjunctiva clear ENM: dry mucous membranes. Normal dentition.  Neck: Supple Respiratory: poor effort, no tachypnea, distant sounds, no rales or wheeze appreciated Cardiovascular: Regular rate and rhythm. Distant heart sounds. Soft systolic murmur Abdomen: obese, mildly distended, non-tender Musculoskeletal: No appreciable joint deformity upper and lower extremities. no contractures. Normal muscle tone.  Skin: scale on feet. Bandage overlying sacral wound Extremities: pitting edema to knees. warm Neurologic: somnolent but arousable, answers some simple questions   Labs on Admission: I have personally reviewed following labs and imaging studies  CBC: Recent Labs  Lab 03/19/19 1032 03/21/19 2128  WBC 6.2 7.5  NEUTROABS 4.0 5.0  HGB 12.7* 11.5*  HCT 41.4 42.2  MCV 98.9 107.4*  PLT 214.0 212   Basic Metabolic Panel: Recent Labs  Lab 03/19/19 1032 03/21/19 2128  NA 141 143  K 4.5 4.2  CL 100 98  CO2 31 36*  GLUCOSE 244* 190*  BUN 25* 25*  CREATININE 2.33* 2.34*  CALCIUM 8.8 8.9   GFR: CrCl cannot be calculated (Unknown ideal weight.). Liver Function Tests: Recent Labs  Lab 03/19/19 1032 03/21/19 2128  AST 17 19  ALT 18 23  ALKPHOS 330* 259*  BILITOT 0.3 0.5  PROT 7.4 7.9  ALBUMIN 3.6 3.2*   No results for input(s): LIPASE, AMYLASE in the last 168 hours. No results for input(s): AMMONIA in the last 168 hours. Coagulation Profile: Recent Labs  Lab 03/21/19 2128  INR 1.1   Cardiac Enzymes: No results for input(s): CKTOTAL, CKMB, CKMBINDEX, TROPONINI in the last 168  hours. BNP (last 3 results) No results for input(s): PROBNP in the last 8760  hours. HbA1C: Recent Labs    03/19/19 1032  HGBA1C 8.8*   CBG: No results for input(s): GLUCAP in the last 168 hours. Lipid Profile: Recent Labs    03/19/19 1032  CHOL 197  HDL 52.60  LDLCALC 113*  TRIG 157.0*  CHOLHDL 4   Thyroid Function Tests: Recent Labs    03/19/19 1032  TSH 1.34   Anemia Panel: Recent Labs    03/19/19 1032  VITAMINB12 583  FOLATE 8.0   Urine analysis:    Component Value Date/Time   COLORURINE YELLOW 03/21/2019 2129   APPEARANCEUR CLEAR 03/21/2019 2129   LABSPEC 1.009 03/21/2019 2129   PHURINE 5.0 03/21/2019 2129   GLUCOSEU NEGATIVE 03/21/2019 2129   HGBUR SMALL (A) 03/21/2019 2129   BILIRUBINUR NEGATIVE 03/21/2019 2129   KETONESUR NEGATIVE 03/21/2019 2129   PROTEINUR NEGATIVE 03/21/2019 2129   UROBILINOGEN 0.2 11/19/2012 2217   NITRITE POSITIVE (A) 03/21/2019 2129   LEUKOCYTESUR MODERATE (A) 03/21/2019 2129    Radiological Exams on Admission: Ct Head Wo Contrast  Result Date: 03/21/2019 CLINICAL DATA:  Altered level of consciousness, shortness of Breath EXAM: CT HEAD WITHOUT CONTRAST TECHNIQUE: Contiguous axial images were obtained from the base of the skull through the vertex without intravenous contrast. COMPARISON:  04/30/2018 FINDINGS: Brain: No acute intracranial abnormality. Specifically, no hemorrhage, hydrocephalus, mass lesion, acute infarction, or significant intracranial injury. Stable encephalomalacia at the right CP angle and overlying craniectomy defect. Vascular: No hyperdense vessel or unexpected calcification. Skull: No acute calvarial abnormality. Sinuses/Orbits: Visualized paranasal sinuses and mastoids clear. Orbital soft tissues unremarkable. Other: Prior right temporal occipital craniectomy. IMPRESSION: No acute intracranial abnormality. Electronically Signed   By: Rolm Baptise M.D.   On: 03/21/2019 23:17   Dg Chest Port 1 View  Result  Date: 03/21/2019 CLINICAL DATA:  Shortness of breath and lethargy EXAM: PORTABLE CHEST 1 VIEW COMPARISON:  February 23, 2019. FINDINGS: There is cardiomegaly with pulmonary venous hypertension. There is slight interstitial edema. There is no consolidation. Patient is status post coronary artery bypass grafting. No bone lesions. IMPRESSION: Pulmonary vascular congestion with mild interstitial edema. There is felt to be a degree of congestive heart failure. No consolidation. Cardiomegaly is stable compared to most recent study. Electronically Signed   By: Lowella Grip III M.D.   On: 03/21/2019 21:44    EKG: Independently reviewed. nsr  Assessment/Plan Principal Problem:   Acute on chronic respiratory failure with hypoxia and hypercapnia (HCC) Active Problems:   CAD (coronary artery disease) of artery bypass graft   Obesity hypoventilation syndrome (HCC)   Acoustic neuroma (HCC)   Status post craniotomy   Facial nerve palsy   Pressure injury of skin   Acute diastolic heart failure (HCC)   CKD (chronic kidney disease) stage 3, GFR 30-59 ml/min (HCC)   Insulin dependent diabetes mellitus ([place])   Morbid obesity with BMI of 50.0-59.9, adult ([place])   Respiratory failure ([place])   # Acute on chronic respiratory failure with hypoxia and hypercarbia # Acute encephalopathy - likely multifactorial (morbid obesity, dCHF, possible underlying COPD). Here edematous, bnp somewhat elevated, vascular congestion seen on CXR. CT head without acute intracranial pathology. vgb showing hyperchloremic acidosis. Recent hospitalization for similar presentation, treated w/ bipap and diuresis and for copd exacerbation. Less favor copd exacerbation given no formal dx, cxr findings not suggestive, and no reported increase in cough or sputum production. ACS eval last hospitalization negative. On opioids at home, may contribute.  - continue bipap, wean as able - repeat vbg in  am - increase torsemide from 60 to 100 mg bid -  daily weight and I/o - holding on repeat round of steroids and abx - am troponin  # ckd 3/4 - cr 2.34, stable - avoid nephrotoxins. Has been outpt nephrology referred  # elevated alk phos - f/u ggt  # macrocytosis - recent b12, folate, tsh wnl - f/u peripheral smear, ldh  # urine nitrites - no reported hx of symptoms - f/u culture, holding on abx  # chronic pain - hold home hydrocodone given acute encephalopahy  # Sacral decubitus ulcer - wound consult  # Paroxysmal a-fib # diastolic chf - hx cardioversion. In sinus currently - continue apixaban, amiodarone, coreg, imdur - torsemide as above  # T2dm - here glucose elvated to 190. Recent a1c 8.8% - decrease daily lantus from 46 to 40, start ssi   DVT prophylaxis: therapeutic on apix Code Status: full  Family Communication: wife  Disposition Plan: tbd  Consults called: none  Admission status: step-down    Desma Maxim MD [facility] Pager [phone]  If 7PM-7AM, please contact night-coverage [email or web address] Password [facility]  03/22/2019, 12:39 AM

## 2019-03-22 NOTE — Progress Notes (Signed)
CRITICAL VALUE ALERT  Critical Value:  Troponin 225  Date & Time Notied: 03/22/19 1100  Provider Notified:  Dr. Florene Glen   Orders Received/Actions taken: yes

## 2019-03-22 NOTE — Progress Notes (Signed)
PROGRESS NOTE    [name].  HFW:[id number] DOB: Jun 11, 1961 DOA: 03/21/2019 PCP: Caren Macadam, MD   Brief Narrative:  Frank Ernest. is [name] 58 y.o. male with medical history significant for cva with right sided facial droop, acoustic neuroma, CAD s/p cabg 2009, htn, gout, insulin-dependent dm, dCHF, paf s/p ablation on anticoagulation, ckd3/4, chronic hypoxic respiratory failure on 3 L [place] O2 at home, morbid obesity, who presents with above.   History, unfortunately, obtained mainly from ED staff as patient remains confused and unable to reach patient's wife.   Per report patient's wife alerted EMS for several days worsening shortness of breath, somnolence, and confusion. No reported chest pain or significant cough. Reportedly compliant with medications. On 3 L [place] O2 at home. Per report also has had worsening lower extremity swelling.  Assessment & Plan:   Principal Problem:   Acute on chronic respiratory failure with hypoxia and hypercapnia (HCC) Active Problems:   CAD (coronary artery disease) of artery bypass graft   Obesity hypoventilation syndrome (HCC)   Acoustic neuroma (HCC)   Status post craniotomy   Facial nerve palsy   Pressure injury of skin   Acute diastolic heart failure (HCC)   CKD (chronic kidney disease) stage 3, GFR 30-59 ml/min (HCC)   Insulin dependent diabetes mellitus ([place])   Morbid obesity with BMI of 50.0-59.9, adult ([place])   Respiratory failure ([place])   # Acute on chronic respiratory failure with hypoxia and hypercarbia  - Pt with heart failure exacerbation and likely with OHS/OSA - I also suspect COPD given hypercarbia -> he does have significant smoking hx, but hasn't smoked since 2016 - no wheezing on examination - CXR with mild interstitial edema - He's on 60 mg torsemide BID at home -> was increased to 100 mg BID here for diuresis - Scheduled and prn nebulizers for likely underlying COPD, will hold off on steroids at this  time (will plan to send home with LAMA) - Continue nightly bipap - he needs an outpatient sleep study   # Acute metabolic encephalopathy - likely 2/2 hypercarbia, has improved, but still confused - follow B12, TSH, ammonia  - continue above treatment for hypercarbia - head CT without acute abnormality  # Elevated Troponin: initial troponin of 144 -> 225.  Pt is asymptomatic without chest pain.  EKG without acute findings.  Discussed with cardiology today who will see [name].  I suspect this is demand ischemia due to his HF exacerbation.  # ckd 3 - cr 2.34, stable - avoid nephrotoxins. Has been outpt nephrology referred - continue to monitor with aggressive diuresis above  # elevated alk phos - f/u ggt -> slightly elevated   # macrocytosis - recent b12, folate, tsh wnl - f/u peripheral smear, ldh  # urine nitrites - no reported hx of symptoms - f/u culture, will start ceftriaxone given his altered mental status  # chronic pain - hold home hydrocodone given acute encephalopahy  # Sacral decubitus ulcer - wound consult, appreciate recs (unable to examined today, will need to examine and photograph for chart when able)  # Paroxysmal Breiona Couvillon-fib # diastolic chf - hx cardioversion.  - continue apixaban, amiodarone, coreg, imdur - torsemide as above  # T2dm - here glucose elvated to 190. Recent a1c 8.8% - decrease daily lantus from 46 to 40, start ssi   DVT prophylaxis: eliquis Code Status: full  Family Communication: called wife Disposition Plan: pending further improvement  Consultants:   cardiology  Procedures:  none  Antimicrobials:  Anti-infectives (From admission, onward)   Start     Dose/Rate Route Frequency Ordered Stop   03/22/19 1915  cefTRIAXone (ROCEPHIN) 1 g in sodium chloride 0.9 % 100 mL IVPB     1 g 200 mL/hr over 30 Minutes Intravenous Every 24 hours 03/22/19 1902          Subjective: Denies CP After Romar Woodrick lot of delay, pt able to say he's  here for "fluid around heart"  Objective: Vitals:   03/22/19 1232 03/22/19 1500 03/22/19 1600 03/22/19 1800  BP:  130/60 (!) 128/91 (!) 153/84  Pulse:  71 71 84  Resp:  (!) 21 (!) 23 (!) 22  Temp: 98.3 F (36.8 C)  98.2 F (36.8 C)   TempSrc: Oral  Oral   SpO2:  95% 93% 94%  Weight:       No intake or output data in the 24 hours ending 03/22/19 1859 Filed Weights   03/22/19 0411  Weight: (!) 169.4 kg    Examination:  General exam: Appears calm and comfortable  Respiratory system: Clear to auscultation. Respiratory effort normal. Cardiovascular system: S1 & S2 heard, RRR. Gastrointestinal system: Abdomen is nondistended, soft and nontender.  Central nervous system: Confused.  R sided facial droop (old per H&P).  Slow to respond.  Moving all extremities. Extremities: Symmetric 5 x 5 power. Skin: decubitus ulcer not examined today    Data Reviewed: I have personally reviewed following labs and imaging studies  CBC: Recent Labs  Lab 03/19/19 1032 03/21/19 2128 03/22/19 0730  WBC 6.2 7.5 8.3  NEUTROABS 4.0 5.0  --   HGB 12.7* 11.5* 11.3*  HCT 41.4 42.2 39.9  MCV 98.9 107.4* 103.9*  PLT 214.0 205 655   Basic Metabolic Panel: Recent Labs  Lab 03/19/19 1032 03/21/19 2128 03/22/19 0730  NA 141 143 144  K 4.5 4.2 3.8  CL 100 98 98  CO2 31 36* 32  GLUCOSE 244* 190* 160*  BUN 25* 25* 25*  CREATININE 2.33* 2.34* 2.37*  CALCIUM 8.8 8.9 8.8*  MG  --   --  2.1   GFR: Estimated Creatinine Clearance: 53.6 mL/min ([place]) (by C-G formula based on SCr of 2.37 mg/dL (H)). Liver Function Tests: Recent Labs  Lab 03/19/19 1032 03/21/19 2128 03/22/19 0730  AST 17 19 20   ALT 18 23 23   ALKPHOS 330* 259* 250*  BILITOT 0.3 0.5 0.5  PROT 7.4 7.9 7.1  ALBUMIN 3.6 3.2* 3.0*   No results for input(s): LIPASE, AMYLASE in the last 168 hours. No results for input(s): AMMONIA in the last 168 hours. Coagulation Profile: Recent Labs  Lab 03/21/19 2128  INR 1.1   Cardiac  Enzymes: No results for input(s): CKTOTAL, CKMB, CKMBINDEX, TROPONINI in the last 168 hours. BNP (last 3 results) No results for input(s): PROBNP in the last 8760 hours. HbA1C: No results for input(s): HGBA1C in the last 72 hours. CBG: Recent Labs  Lab 03/22/19 0747 03/22/19 1205 03/22/19 1635  GLUCAP 149* 155* 189*   Lipid Profile: No results for input(s): CHOL, HDL, LDLCALC, TRIG, CHOLHDL, LDLDIRECT in the last 72 hours. Thyroid Function Tests: No results for input(s): TSH, T4TOTAL, FREET4, T3FREE, THYROIDAB in the last 72 hours. Anemia Panel: No results for input(s): VITAMINB12, FOLATE, FERRITIN, TIBC, IRON, RETICCTPCT in the last 72 hours. Sepsis Labs: No results for input(s): PROCALCITON, LATICACIDVEN in the last 168 hours.  Recent Results (from the past 240 hour(s))  SARS Coronavirus 2 [facility] order,  Performed in [place] hospital lab)     Status: None   Collection Time: 03/21/19  9:30 PM  Result Value Ref Range Status   SARS Coronavirus 2 NEGATIVE NEGATIVE Final    Comment: (NOTE) If result is NEGATIVE SARS-CoV-2 target nucleic acids are NOT DETECTED. The SARS-CoV-2 RNA is generally detectable in upper and lower  respiratory specimens during the acute phase of infection. The lowest  concentration of SARS-CoV-2 viral copies this assay can detect is 250  copies / mL. [name] negative result does not preclude SARS-CoV-2 infection  and should not be used as the sole basis for treatment or other  patient management decisions.  [name] negative result may occur with  improper specimen collection / handling, submission of specimen other  than nasopharyngeal swab, presence of viral mutation(s) within the  areas targeted by this assay, and inadequate number of viral copies  (<250 copies / mL). [name] negative result must be combined with clinical  observations, patient history, and epidemiological information. If result is POSITIVE SARS-CoV-2 target nucleic acids are DETECTED. The  SARS-CoV-2 RNA is generally detectable in upper and lower  respiratory specimens dur ing the acute phase of infection.  Positive  results are indicative of active infection with SARS-CoV-2.  Clinical  correlation with patient history and other diagnostic information is  necessary to determine patient infection status.  Positive results do  not rule out bacterial infection or co-infection with other viruses. If result is PRESUMPTIVE POSTIVE SARS-CoV-2 nucleic acids MAY BE PRESENT.   [name] presumptive positive result was obtained on the submitted specimen  and confirmed on repeat testing.  While 2019 novel coronavirus  (SARS-CoV-2) nucleic acids may be present in the submitted sample  additional confirmatory testing may be necessary for epidemiological  and / or clinical management purposes  to differentiate between  SARS-CoV-2 and other Sarbecovirus currently known to infect humans.  If clinically indicated additional testing with an alternate test  methodology [phone]) is advised. The SARS-CoV-2 RNA is generally  detectable in upper and lower respiratory sp ecimens during the acute  phase of infection. The expected result is Negative. Fact Sheet for Patients:  StrictlyIdeas.no Fact Sheet for Healthcare Providers: BankingDealers.co.za This test is not yet approved or cleared by the [place] FDA and has been authorized for detection and/or diagnosis of SARS-CoV-2 by FDA under an Emergency Use Authorization (EUA).  This EUA will remain in effect (meaning this test can be used) for the duration of the COVID-19 declaration under Section 564(b)(1) of the Act, 21 U.S.C. section 360bbb-3(b)(1), unless the authorization is terminated or revoked sooner. Performed at [facility], [place] [address] [place], [place] [address]   MRSA PCR Screening     Status: None   Collection Time: 03/22/19  4:11 AM   Specimen: Nasopharyngeal   Result Value Ref Range Status   MRSA by PCR NEGATIVE NEGATIVE Final    Comment:        The GeneXpert MRSA Assay (FDA approved for NASAL specimens only), is one component of [name] comprehensive MRSA colonization surveillance program. It is not intended to diagnose MRSA infection nor to guide or monitor treatment for MRSA infections. Performed at [facility], [place] [address], [place], [place] [address]          Radiology Studies: Ct Head Wo Contrast  Result Date: 03/21/2019 CLINICAL DATA:  Altered level of consciousness, shortness of Breath EXAM: CT HEAD WITHOUT CONTRAST TECHNIQUE: Contiguous axial images were obtained from the base  of the skull through the vertex without intravenous contrast. COMPARISON:  04/30/2018 FINDINGS: Brain: No acute intracranial abnormality. Specifically, no hemorrhage, hydrocephalus, mass lesion, acute infarction, or significant intracranial injury. Stable encephalomalacia at the right CP angle and overlying craniectomy defect. Vascular: No hyperdense vessel or unexpected calcification. Skull: No acute calvarial abnormality. Sinuses/Orbits: Visualized paranasal sinuses and mastoids clear. Orbital soft tissues unremarkable. Other: Prior right temporal occipital craniectomy. IMPRESSION: No acute intracranial abnormality. Electronically Signed   By: [name] M.D.   On: 03/21/2019 23:17   Dg Chest Port 1 View  Result Date: 03/21/2019 CLINICAL DATA:  Shortness of breath and lethargy EXAM: PORTABLE CHEST 1 VIEW COMPARISON:  February 23, 2019. FINDINGS: There is cardiomegaly with pulmonary venous hypertension. There is slight interstitial edema. There is no consolidation. Patient is status post coronary artery bypass grafting. No bone lesions. IMPRESSION: Pulmonary vascular congestion with mild interstitial edema. There is felt to be Ruweyda Macknight degree of congestive heart failure. No consolidation. Cardiomegaly is stable compared to most recent study. Electronically  Signed   By: [name] III M.D.   On: 03/21/2019 21:44        Scheduled Meds: . amiodarone  100 mg Oral Daily  . apixaban  5 mg Oral BID  . carvedilol  12.5 mg Oral BID WC  . chlorhexidine  15 mL Mouth Rinse BID  . Chlorhexidine Gluconate Cloth  6 each Topical Daily  . insulin aspart  0-20 Units Subcutaneous TID WC  . insulin aspart  0-5 Units Subcutaneous QHS  . insulin glargine  40 Units Subcutaneous QHS  . isosorbide mononitrate  30 mg Oral Daily  . mouth rinse  15 mL Mouth Rinse q12n4p  . potassium chloride SA  40 mEq Oral BID  . sodium chloride flush  3 mL Intravenous Q12H  . topiramate  100 mg Oral QHS  . torsemide  100 mg Oral BID   Continuous Infusions: . sodium chloride       LOS: 0 days    Time spent: over 30 min    Fayrene Helper, MD [facility] Pager AMION  If 7PM-7AM, please contact night-coverage [email or web address] Password [facility] 03/22/2019, 6:59 PM

## 2019-03-22 NOTE — Consult Note (Signed)
[place] Nurse wound consult note Patient receiving care in [place] [address].  I have sent a Secure Chat message to Dr. Loni Muse. [place], asking for a photo of the sacral wound to be placed in the record. Val Riles, RN, MSN, CWOCN, CNS-BC, pager [phone]

## 2019-03-23 ENCOUNTER — Inpatient Hospital Stay (HOSPITAL_COMMUNITY): Payer: HMO

## 2019-03-23 DIAGNOSIS — I5033 Acute on chronic diastolic (congestive) heart failure: Secondary | ICD-10-CM

## 2019-03-23 LAB — COMPREHENSIVE METABOLIC PANEL WITH GFR
ALT: 23 U/L (ref 0–44)
AST: 21 U/L (ref 15–41)
Albumin: 3.2 g/dL — ABNORMAL LOW (ref 3.5–5.0)
Alkaline Phosphatase: 257 U/L — ABNORMAL HIGH (ref 38–126)
Anion gap: 11 (ref 5–15)
BUN: 34 mg/dL — ABNORMAL HIGH (ref 6–20)
CO2: 35 mmol/L — ABNORMAL HIGH (ref 22–32)
Calcium: 9 mg/dL (ref 8.9–10.3)
Chloride: 98 mmol/L (ref 98–111)
Creatinine, Ser: 2.53 mg/dL — ABNORMAL HIGH (ref 0.61–1.24)
GFR calc Af Amer: 31 mL/min — ABNORMAL LOW
GFR calc non Af Amer: 27 mL/min — ABNORMAL LOW
Glucose, Bld: 157 mg/dL — ABNORMAL HIGH (ref 70–99)
Potassium: 4 mmol/L (ref 3.5–5.1)
Sodium: 144 mmol/L (ref 135–145)
Total Bilirubin: 0.6 mg/dL (ref 0.3–1.2)
Total Protein: 7.6 g/dL (ref 6.5–8.1)

## 2019-03-23 LAB — GLUCOSE, CAPILLARY
Glucose-Capillary: 157 mg/dL — ABNORMAL HIGH (ref 70–99)
Glucose-Capillary: 267 mg/dL — ABNORMAL HIGH (ref 70–99)
Glucose-Capillary: 156 mg/dL — ABNORMAL HIGH (ref 70–99)

## 2019-03-23 LAB — CBC
HCT: 39.9 % (ref 39.0–52.0)
Hemoglobin: 11.5 g/dL — ABNORMAL LOW (ref 13.0–17.0)
MCH: 30.5 pg (ref 26.0–34.0)
MCHC: 28.8 g/dL — ABNORMAL LOW (ref 30.0–36.0)
MCV: 105.8 fL — ABNORMAL HIGH (ref 80.0–100.0)
Platelets: 215 10*3/uL (ref 150–400)
RBC: 3.77 MIL/uL — ABNORMAL LOW (ref 4.22–5.81)
RDW: 13.4 % (ref 11.5–15.5)
WBC: 8.6 10*3/uL (ref 4.0–10.5)
nRBC: 0 % (ref 0.0–0.2)

## 2019-03-23 LAB — BLOOD GAS, VENOUS
Acid-Base Excess: 10.3 mmol/L — ABNORMAL HIGH (ref 0.0–2.0)
Bicarbonate: 37.8 mmol/L — ABNORMAL HIGH (ref 20.0–28.0)
O2 Saturation: 74.5 %
Patient temperature: 98.6
pCO2, Ven: 68.2 mmHg — ABNORMAL HIGH (ref 44.0–60.0)
pH, Ven: 7.362 (ref 7.250–7.430)
pO2, Ven: 40.1 mmHg (ref 32.0–45.0)

## 2019-03-23 LAB — MAGNESIUM: Magnesium: 2.4 mg/dL (ref 1.7–2.4)

## 2019-03-23 MED ORDER — FUROSEMIDE 10 MG/ML IJ SOLN
60.0000 mg | Freq: Two times a day (BID) | INTRAMUSCULAR | Status: AC
  Administered 2019-03-23 (×2): 60 mg via INTRAVENOUS
  Filled 2019-03-23 (×2): qty 6

## 2019-03-23 MED ORDER — HYPROMELLOSE (GONIOSCOPIC) 2.5 % OP SOLN: 1.0000 [drp] | Freq: Four times a day (QID) | OPHTHALMIC | Status: DC

## 2019-03-23 MED ORDER — POLYVINYL ALCOHOL 1.4 % OP SOLN
1.0000 [drp] | Freq: Four times a day (QID) | OPHTHALMIC | Status: DC
  Administered 2019-03-23 – 2019-03-27 (×18): 1 [drp] via OPHTHALMIC
  Filled 2019-03-23 (×2): qty 15

## 2019-03-23 MED ORDER — TOPIRAMATE 100 MG PO TABS
100.0000 mg | ORAL_TABLET | Freq: Every day | ORAL | Status: DC
  Administered 2019-03-23 – 2019-03-26 (×4): 100 mg via ORAL
  Filled 2019-03-23 (×4): qty 1

## 2019-03-23 MED ORDER — ROSUVASTATIN CALCIUM 20 MG PO TABS
20.0000 mg | ORAL_TABLET | Freq: Every day | ORAL | Status: DC
  Administered 2019-03-23 – 2019-03-26 (×4): 20 mg via ORAL
  Filled 2019-03-23 (×4): qty 1

## 2019-03-23 MED ORDER — HYDROCODONE-ACETAMINOPHEN 10-325 MG PO TABS
1.0000 | ORAL_TABLET | Freq: Four times a day (QID) | ORAL | Status: DC | PRN
  Administered 2019-03-23 – 2019-03-26 (×12): 1 via ORAL
  Filled 2019-03-23 (×12): qty 1

## 2019-03-23 NOTE — Progress Notes (Signed)
Pt. Refuses CPAP device and is non-compliant with this device despite education.

## 2019-03-23 NOTE — Consult Note (Signed)
Cardiology Consultation:   Patient ID: [name]. MRN: [id number]; DOB: 06-28-1961  Admit date: 03/21/2019 Date of Consult: 03/23/2019  Primary Care Provider: Caren Macadam, MD Primary Cardiologist: [name], MD  Primary Electrophysiologist:  None    Patient Profile:   [name]. is a 58 y.o. male with a hx of CAD, chronic diastolic HF, afib who is being seen today for the evaluation of SOB at the request of Dr Florene Glen  History of Present Illness:   [name] 58 yo male history of CAD with prior CABG single vessel LIMA-LAD in 3662, chronic diastolic HF, afib, prior CVA, HTN, DM2, chronic resp failure on 3L O2 at home admitted with AMS and SOB.   Limited history on presentation due to patient's confusion. From chart admitting team noted that family reported several days or worsening SOB, LE edema, somnolence, and confusion.   In ER initially on bipap.    Recent admission early July with volume overload, diuresed 10.9L. DIscharged on demadex 60mg  bid. Reported discharge weight 167 kg  WBC 8.3 Hgb 11.3 Plt 178 K 3.8 Cr 2.37 (baseline 2.3), Mg 2.1 TSH 0.57 CXR pulm congestion CT head no acute process Venous ABG 7.38/65 hstrop 144-->225--> EKG SR, RAD, no ischemic changes 02/2019 echo LVE 55-60%, very limited study even with contrast  Heart Pathway Score:     Past Medical History:  Diagnosis Date  . Brain tumor ([place])    Takes Topamax so patient will not have headaches  . Bruises easily   . CAD (coronary artery disease)    Cath 09, 99% LAD  . Cardiomyopathy, ischemic    Ischemic  . CHF (congestive heart failure) ([place])   . Cyst near coccyx   . Cyst near tailbone   . Diabetes mellitus without complication (HCC)    borderline  . Edema of both legs    Takes Lasix  . Fibromyalgia   . Gout   . Headache(784.0)    with lights  . Hypertension    dr Percival Spanish  . Kidney stones   . Myocardial infarction ([place])   . Obesity   . Pneumonia     hx of  . Shortness of breath   . Stroke [facility])    Affected vision, walking, and facial drooping on right face    Past Surgical History:  Procedure Laterality Date  . 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 ANAL FISTULECTOMY    . CARDIOVERSION N/A 05/08/2018   Procedure: CARDIOVERSION;  Surgeon: [name], MD;  Location: [facility] ENDOSCOPY;  Service: Cardiovascular;  Laterality: N/A;  . CORONARY ARTERY BYPASS GRAFT     LIMA to LAD 2009  . CRANIOTOMY Right 11/20/2012   Procedure: CRANIOTOMY REPAIR DURAL/CENTRAL SPINAL FLUID LEAK;  Surgeon: [name], MD;  Location: [place] NEURO ORS;  Service: Neurosurgery;  Laterality: Right;  . EYE SURGERY     to coorect lid and double vision  . MEDIAN RECTUS REPAIR Right 02/16/2016   Procedure: RIGHT LATERAL RECTUS RESECTION; SUPERIOR RECTUS RESECTION RIGHT EYE; RIGHT SUPERIOR RECTUS RECESSION; LATERAL TARSAL STRIP RIGHT LOWER EYELID;  Surgeon: [name], MD;  Location: [place];  Service: Ophthalmology;  Laterality: Right;  . PLACEMENT OF LUMBAR DRAIN N/A 11/20/2012   Procedure: Attempted PLACEMENT OF LUMBAR DRAIN;  Surgeon: [name], MD;  Location: [place] NEURO ORS;  Service: Neurosurgery;  Laterality: N/A;  . RETROSIGMOID CRANIECTOMY FOR TUMOR RESECTION Right 10/11/2012   Procedure: RETROSIGMOID CRANIECTOMY FOR TUMOR RESECTION;  Surgeon: [name], MD;  Location: [place] NEURO ORS;  Service: Neurosurgery;  Laterality: Right;  Craniotomy for acoustic neuroma  . TEE WITHOUT CARDIOVERSION N/A 05/08/2018   Procedure: TRANSESOPHAGEAL ECHOCARDIOGRAM (TEE);  Surgeon: [name], MD;  Location[facility] ENDOSCOPY;  Service: Cardiovascular;  Laterality: N/A;  . TRACHEOSTOMY TUBE PLACEMENT N/A 10/11/2012   Procedure: TRACHEOSTOMY;  Surgeon: Ascencion Dike, MD;  Location: [facility] NEURO ORS;  Service: ENT;  Laterality: N/A;  . UMBILICAL HERNIA  REPAIR       Inpatient Medications: Scheduled Meds: . amiodarone  100 mg Oral Daily  . apixaban  5 mg Oral BID  . carvedilol  12.5 mg Oral BID WC  . chlorhexidine  15 mL Mouth Rinse BID  . Chlorhexidine Gluconate Cloth  6 each Topical Daily  . insulin aspart  0-20 Units Subcutaneous TID WC  . insulin aspart  0-5 Units Subcutaneous QHS  . insulin glargine  40 Units Subcutaneous QHS  . isosorbide mononitrate  30 mg Oral Daily  . mouth rinse  15 mL Mouth Rinse q12n4p  . potassium chloride SA  40 mEq Oral BID  . sodium chloride flush  3 mL Intravenous Q12H  . topiramate  100 mg Oral QHS  . torsemide  100 mg Oral BID   Continuous Infusions: . sodium chloride    . cefTRIAXone (ROCEPHIN)  IV Stopped (03/22/19 2117)   PRN Meds: sodium chloride, artificial tears, ipratropium-albuterol, nystatin, polyvinyl alcohol, sodium chloride flush, tiZANidine  Allergies:    Allergies  Allergen Reactions  . Morphine And Related Anaphylaxis    "makes me stop breathing"  . Novocain [Procaine] Anaphylaxis  . Carbamazepine     shaking  . Codeine Other (See Comments)    "makes heart race" per patient  . Gabapentin Other (See Comments)    Blood in urine  . Ivp Dye [Iodinated Diagnostic Agents]     Passing out  . Other     Steroids makes hearts race  . Prednisone     Heart racing    Social History:   Social History   Socioeconomic History  . Marital status: Married    Spouse name: Not on file  . Number of children: Not on file  . Years of education: Not on file  . Highest education level: Not on file  Occupational History  . Not on file  Social Needs  . Financial resource strain: Not on file  . Food insecurity    Worry: Not on file    Inability: Not on file  . Transportation needs    Medical: Not on file    Non-medical: Not on file  Tobacco Use  . Smoking status: Former Smoker    Packs/day: 0.25    Years: 20.00    Pack years: 5.00    Types: Cigarettes  . Smokeless tobacco:  Current User  . Tobacco comment: Rare tobacco.  "[name]".  Substance and Sexual Activity  . Alcohol use: No    Alcohol/week: 0.0 standard drinks  . Drug use: No  . Sexual activity: Yes  Lifestyle  . Physical activity    Days per week: Not on file    Minutes per session: Not on file  . Stress: Not on file  Relationships  . Social Herbalist on phone: Not on file    Gets together: Not  on file    Attends religious service: Not on file    Active member of club or organization: Not on file    Attends meetings of clubs or organizations: Not on file    Relationship status: Not on file  . Intimate partner violence    Fear of current or ex partner: Not on file    Emotionally abused: Not on file    Physically abused: Not on file    Forced sexual activity: Not on file  Other Topics Concern  . Not on file  Social History Narrative  . Not on file    Family History:    Family History  Problem Relation Age of Onset  . CAD Father   . Hypertension Mother   . Cancer Other        multiple relatives  . Diabetes Maternal Grandmother   . Diabetes Maternal Aunt   . Leukemia Cousin   . Leukemia Maternal Grandfather      ROS:  Please see the history of present illness.  All other ROS reviewed and negative.     Physical Exam/Data:   Vitals:   03/23/19 0300 03/23/19 0330 03/23/19 0334 03/23/19 0440  BP: 133/66     Pulse: 66 68    Resp: (!) 22 (!) 23    Temp:   98.1 F (36.7 C)   TempSrc:   Oral   SpO2: 96% 91%    Weight:    (!) 171.7 kg  Height:    5\' 11"  (1.803 m)    Intake/Output Summary (Last 24 hours) at 03/23/2019 0721 Last data filed at 03/23/2019 0500 Gross per 24 hour  Intake 340 ml  Output 1250 ml  Net -910 ml   Last 3 Weights 03/23/2019 03/22/2019 02/27/2019  Weight (lbs) 378 lb 8.5 oz 373 lb 7.4 oz 368 lb 6.2 oz  Weight (kg) 171.7 kg 169.4 kg 167.1 kg     Body mass index is 52.79 kg/m.  General:  Well nourished, well developed, in no acute  distress HEENT: normal Lymph: no adenopathy Neck: elevated JVD Endocrine:  No thryomegaly Vascular: No carotid bruits; FA pulses 2+ bilaterally without bruits  Cardiac:  normal S1, S2; RRR; no murmur Lungs:  clear to auscultation bilaterally, no wheezing, rhonchi or rales  Abd: soft, nontender, no hepatomegaly  Ext:1+bilateral LE edema Musculoskeletal:  No deformities, BUE and BLE strength normal and equal Skin: warm and dry  Neuro:  CNs 2-12 intact, no focal abnormalities noted Psych:  Normal affect    Laboratory Data:  High Sensitivity Troponin:   Recent Labs  Lab 02/22/19 0324 02/22/19 0524 03/22/19 0723 03/22/19 1017  TROPONINIHS 14 15.0 144* 225*     Cardiac EnzymesNo results for input(s): TROPONINI in the last 168 hours. No results for input(s): TROPIPOC in the last 168 hours.  Chemistry Recent Labs  Lab 03/19/19 1032 03/21/19 2128 03/22/19 0730  NA 141 143 144  K 4.5 4.2 3.8  CL 100 98 98  CO2 31 36* 32  GLUCOSE 244* 190* 160*  BUN 25* 25* 25*  CREATININE 2.33* 2.34* 2.37*  CALCIUM 8.8 8.9 8.8*  GFRNONAA  --  30* 29*  GFRAA  --  34* 34*  ANIONGAP  --  9 14    Recent Labs  Lab 03/19/19 1032 03/21/19 2128 03/22/19 0730  PROT 7.4 7.9 7.1  ALBUMIN 3.6 3.2* 3.0*  AST 17 19 20   ALT 18 23 23   ALKPHOS 330* 259* 250*  BILITOT 0.3 0.5 0.5  Hematology Recent Labs  Lab 03/19/19 1032 03/21/19 2128 03/22/19 0730  WBC 6.2 7.5 8.3  RBC 4.19* 3.93* 3.84*  HGB 12.7* 11.5* 11.3*  HCT 41.4 42.2 39.9  MCV 98.9 107.4* 103.9*  MCH  --  29.3 29.4  MCHC 30.7 27.3* 28.3*  RDW 14.7 13.2 13.2  PLT 214.0 205 178   BNP Recent Labs  Lab 03/21/19 2130  BNP 282.0*    DDimer No results for input(s): DDIMER in the last 168 hours.   Radiology/Studies:  Ct Head Wo Contrast  Result Date: 03/21/2019 CLINICAL DATA:  Altered level of consciousness, shortness of Breath EXAM: CT HEAD WITHOUT CONTRAST TECHNIQUE: Contiguous axial images were obtained from the base of  the skull through the vertex without intravenous contrast. COMPARISON:  04/30/2018 FINDINGS: Brain: No acute intracranial abnormality. Specifically, no hemorrhage, hydrocephalus, mass lesion, acute infarction, or significant intracranial injury. Stable encephalomalacia at the right CP angle and overlying craniectomy defect. Vascular: No hyperdense vessel or unexpected calcification. Skull: No acute calvarial abnormality. Sinuses/Orbits: Visualized paranasal sinuses and mastoids clear. Orbital soft tissues unremarkable. Other: Prior right temporal occipital craniectomy. IMPRESSION: No acute intracranial abnormality. Electronically Signed   By: Rolm Baptise M.D.   On: 03/21/2019 23:17   Dg Chest Port 1 View  Result Date: 03/21/2019 CLINICAL DATA:  Shortness of breath and lethargy EXAM: PORTABLE CHEST 1 VIEW COMPARISON:  February 23, 2019. FINDINGS: There is cardiomegaly with pulmonary venous hypertension. There is slight interstitial edema. There is no consolidation. Patient is status post coronary artery bypass grafting. No bone lesions. IMPRESSION: Pulmonary vascular congestion with mild interstitial edema. There is felt to be a degree of congestive heart failure. No consolidation. Cardiomegaly is stable compared to most recent study. Electronically Signed   By: Lowella Grip III M.D.   On: 03/21/2019 21:44    Assessment and Plan:   1. Acute on chronic diastolic HF - presents volume overloaded. Discharge weight from early July admission 167 kg, 169 kg on admission. CXR with pulm edema - limited I/Os thus far, negative 92mL overnight. He received IV lasix 60mg  yesterday and oral torsemide 100mg  bid.. Looks like in July responded well to IV lasix 60mg  bid. Currently on torsemide 100mg  bid. Would manage with IV lasix initially, then can convert to oral therapy.   - remains fluid overloaded, dose lasix 60mg  IV bid today, reasses and reorder diuretic tomorrow.   2. Elevated troponin/CAD - mild and  nonspecific in setting of CHF and CKD. EKG without acute ischemic changes, no specific chest pain.  - primary cardiology note from June says to continue crestor 20. Not on med list, unclear history. I don't see a clear contraindication, restart   3. Afib - history of DCCV 04/2018 - has been on amiodarone 100mg  daily - also on eliquis, coreg 12.5mg  bid    For questions or updates, please contact [place] Please consult [email or web address] for contact info under     Signed, Carlyle Dolly, MD  03/23/2019 7:21 AM

## 2019-03-23 NOTE — Progress Notes (Addendum)
PROGRESS NOTE    [name].  AST:[id number] DOB: 04-04-1961 DOA: 03/21/2019 PCP: Caren Macadam, MD   Brief Narrative:  Frank Mckillop. is a 58 y.o. male with medical history significant for cva with right sided facial droop, acoustic neuroma, CAD s/p cabg 2009, htn, gout, insulin-dependent dm, dCHF, paf s/p ablation on anticoagulation, ckd3/4, chronic hypoxic respiratory failure on 3 L [place] O2 at home, morbid obesity, who presents with above.   History, unfortunately, obtained mainly from ED staff as patient remains confused and unable to reach patient's wife.   Per report patient's wife alerted EMS for several days worsening shortness of breath, somnolence, and confusion. No reported chest pain or significant cough. Reportedly compliant with medications. On 3 L [place] O2 at home. Per report also has had worsening lower extremity swelling.  Assessment & Plan:   Principal Problem:   Acute on chronic respiratory failure with hypoxia and hypercapnia (HCC) Active Problems:   CAD (coronary artery disease) of artery bypass graft   Obesity hypoventilation syndrome (HCC)   Acoustic neuroma (HCC)   Status post craniotomy   Facial nerve palsy   Pressure injury of skin   Acute diastolic heart failure (HCC)   CKD (chronic kidney disease) stage 3, GFR 30-59 ml/min (HCC)   Insulin dependent diabetes mellitus ([place])   Morbid obesity with BMI of 50.0-59.9, adult (HCC)   Respiratory failure ([place])   # Acute on chronic respiratory failure with hypoxia and hypercarbia  - Pt with heart failure exacerbation and likely with OHS/OSA - I also suspect COPD given hypercarbia -> he does have significant smoking hx, but hasn't smoked since 2016 - no wheezing on examination - CXR with mild interstitial edema - He's on 60 mg torsemide BID at home -> was increased to 100 mg BID here for diuresis - per cards, will transition to lasix 60 mg BID - Scheduled and prn nebulizers for likely  underlying COPD, will hold off on steroids at this time (will plan to send home with LAMA) - repeat CXR with mild interstitial edema - Continue nightly bipap - he needs an outpatient sleep study   # Acute metabolic encephalopathy - likely 2/2 hypercarbia, has improved, he's much better today - follow B12, TSH, ammonia  - continue above treatment for hypercarbia - head CT without acute abnormality  # Elevated Troponin: initial troponin of 144 -> 225.  Pt is asymptomatic without chest pain.  EKG without acute findings.  I suspect this is demand ischemia due to his HF exacerbation. - appreciate cardiology recommendations - restart crestor  # aki on ckd 3 - cr 2.53 today, up from baseline of ~2.2-2.3 - likely 2/2 diuresis - avoid nephrotoxins. Has been outpt nephrology referred - continue to monitor with aggressive diuresis above  # elevated alk phos - f/u ggt -> slightly elevated   # macrocytosis - recent b12, folate, tsh wnl - f/u peripheral smear, ldh (wnl)  # urine nitrites - no reported hx of symptoms - f/u culture (growing providencia), will continue ceftriaxone, follow susceptibilities  # chronic pain - hold home hydrocodone given acute encephalopahy -> needs to exercise caution with this due to risk of hypercarbia above  # Sacral decubitus ulcer - wound consult, appreciate recs (unable to examined today, will need to examine and photograph for chart when able)  # Paroxysmal a-fib # diastolic chf - hx cardioversion.  - continue apixaban, amiodarone, coreg, imdur - torsemide as above  # T2dm - here glucose  elvated to 190. Recent a1c 8.8% - decrease daily lantus from 46 to 40, start ssi   DVT prophylaxis: eliquis Code Status: full  Family Communication: called wife 8/2 Disposition Plan: pending further improvement  Consultants:   cardiology  Procedures:  none  Antimicrobials:  Anti-infectives (From admission, onward)   Start     Dose/Rate Route  Frequency Ordered Stop   03/22/19 2000  cefTRIAXone (ROCEPHIN) 1 g in sodium chloride 0.9 % 100 mL IVPB     1 g 200 mL/hr over 30 Minutes Intravenous Every 24 hours 03/22/19 1902          Subjective: A&Ox3 Feels better, asking for eye drops and lubricant  Objective: Vitals:   03/23/19 0800 03/23/19 0900 03/23/19 1000 03/23/19 1252  BP: 122/64 (!) 118/53 124/66   Pulse: 65 63 72   Resp: (!) 24 (!) 22 (!) 23   Temp: 97.8 F (36.6 C)   98.7 F (37.1 C)  TempSrc: Oral   Oral  SpO2: 95% 97% 96%   Weight:      Height:        Intake/Output Summary (Last 24 hours) at 03/23/2019 1253 Last data filed at 03/23/2019 1048 Gross per 24 hour  Intake 340 ml  Output 2600 ml  Net -2260 ml   Filed Weights   03/22/19 0411 03/23/19 0440  Weight: (!) 169.4 kg (!) 171.7 kg    Examination:  General: No acute distress. Cardiovascular: Heart sounds show a regular rate, and rhythm.  Lungs: Clear to auscultation bilaterally  Abdomen: Soft, nontender, nondistended  Neurological: Alert and oriented 2-3 (knew year, not month), but much better than yesterday. Moves all extremities 4. Cranial nerves II through XII grossly intact. Skin: Warm and dry. No rashes or lesions. Extremities: No clubbing or cyanosis. Bilateral LE edema.  Psychiatric: Mood and affect are normal. Insight and judgment are appropriate.     Data Reviewed: I have personally reviewed following labs and imaging studies  CBC: Recent Labs  Lab 03/19/19 1032 03/21/19 2128 03/22/19 0730 03/23/19 0803  WBC 6.2 7.5 8.3 8.6  NEUTROABS 4.0 5.0  --   --   HGB 12.7* 11.5* 11.3* 11.5*  HCT 41.4 42.2 39.9 39.9  MCV 98.9 107.4* 103.9* 105.8*  PLT 214.0 205 178 545   Basic Metabolic Panel: Recent Labs  Lab 03/19/19 1032 03/21/19 2128 03/22/19 0730 03/23/19 0803  NA 141 143 144 144  K 4.5 4.2 3.8 4.0  CL 100 98 98 98  CO2 31 36* 32 35*  GLUCOSE 244* 190* 160* 157*  BUN 25* 25* 25* 34*  CREATININE 2.33* 2.34* 2.37*  2.53*  CALCIUM 8.8 8.9 8.8* 9.0  MG  --   --  2.1 2.4   GFR: Estimated Creatinine Clearance: 51.3 mL/min (A) (by C-G formula based on SCr of 2.53 mg/dL (H)). Liver Function Tests: Recent Labs  Lab 03/19/19 1032 03/21/19 2128 03/22/19 0730 03/23/19 0803  AST 17 19 20 21   ALT 18 23 23 23   ALKPHOS 330* 259* 250* 257*  BILITOT 0.3 0.5 0.5 0.6  PROT 7.4 7.9 7.1 7.6  ALBUMIN 3.6 3.2* 3.0* 3.2*   No results for input(s): LIPASE, AMYLASE in the last 168 hours. Recent Labs  Lab 03/22/19 1927  AMMONIA 30   Coagulation Profile: Recent Labs  Lab 03/21/19 2128  INR 1.1   Cardiac Enzymes: No results for input(s): CKTOTAL, CKMB, CKMBINDEX, TROPONINI in the last 168 hours. BNP (last 3 results) No results for input(s): PROBNP in the  last 8760 hours. HbA1C: No results for input(s): HGBA1C in the last 72 hours. CBG: Recent Labs  Lab 03/22/19 1205 03/22/19 1635 03/22/19 2141 03/23/19 0741 03/23/19 1217  GLUCAP 155* 189* 165* 157* 267*   Lipid Profile: No results for input(s): CHOL, HDL, LDLCALC, TRIG, CHOLHDL, LDLDIRECT in the last 72 hours. Thyroid Function Tests: Recent Labs    03/22/19 1927  TSH 0.570   Anemia Panel: Recent Labs    03/22/19 1927  VITAMINB12 562   Sepsis Labs: No results for input(s): PROCALCITON, LATICACIDVEN in the last 168 hours.  Recent Results (from the past 240 hour(s))  Urine culture     Status: Abnormal (Preliminary result)   Collection Time: 03/21/19  9:29 PM   Specimen: Urine, Random  Result Value Ref Range Status   Specimen Description   Final    URINE, RANDOM Performed at [facility] [address], [place], [place] [address]    Special Requests   Final    NONE Performed at [facility], [place] [address], [place], [place] [address]    Culture (A)  Final    >=100,000 COLONIES/mL PROVIDENCIA RETTGERI SUSCEPTIBILITIES TO FOLLOW Performed at [facility], [place] [address],  [place], [place] [address]    Report Status PENDING  Incomplete  SARS Coronavirus 2 [facility] order, Performed in [place] hospital lab)     Status: None   Collection Time: 03/21/19  9:30 PM  Result Value Ref Range Status   SARS Coronavirus 2 NEGATIVE NEGATIVE Final    Comment: (NOTE) If result is NEGATIVE SARS-CoV-2 target nucleic acids are NOT DETECTED. The SARS-CoV-2 RNA is generally detectable in upper and lower  respiratory specimens during the acute phase of infection. The lowest  concentration of SARS-CoV-2 viral copies this assay can detect is 250  copies / mL. A negative result does not preclude SARS-CoV-2 infection  and should not be used as the sole basis for treatment or other  patient management decisions.  A negative result may occur with  improper specimen collection / handling, submission of specimen other  than nasopharyngeal swab, presence of viral mutation(s) within the  areas targeted by this assay, and inadequate number of viral copies  (<250 copies / mL). A negative result must be combined with clinical  observations, patient history, and epidemiological information. If result is POSITIVE SARS-CoV-2 target nucleic acids are DETECTED. The SARS-CoV-2 RNA is generally detectable in upper and lower  respiratory specimens dur ing the acute phase of infection.  Positive  results are indicative of active infection with SARS-CoV-2.  Clinical  correlation with patient history and other diagnostic information is  necessary to determine patient infection status.  Positive results do  not rule out bacterial infection or co-infection with other viruses. If result is PRESUMPTIVE POSTIVE SARS-CoV-2 nucleic acids MAY BE PRESENT.   A presumptive positive result was obtained on the submitted specimen  and confirmed on repeat testing.  While 2019 novel coronavirus  (SARS-CoV-2) nucleic acids may be present in the submitted sample  additional confirmatory testing may be necessary  for epidemiological  and / or clinical management purposes  to differentiate between  SARS-CoV-2 and other Sarbecovirus currently known to infect humans.  If clinically indicated additional testing with an alternate test  methodology [phone]) is advised. The SARS-CoV-2 RNA is generally  detectable in upper and lower respiratory sp ecimens during the acute  phase of infection. The expected result is Negative. Fact Sheet for Patients:  StrictlyIdeas.no  Fact Sheet for Healthcare Providers: BankingDealers.co.za This test is not yet approved or cleared by the [place] FDA and has been authorized for detection and/or diagnosis of SARS-CoV-2 by FDA under an Emergency Use Authorization (EUA).  This EUA will remain in effect (meaning this test can be used) for the duration of the COVID-19 declaration under Section 564(b)(1) of the Act, 21 U.S.C. section 360bbb-3(b)(1), unless the authorization is terminated or revoked sooner. Performed at [facility], [place] [address], [place], [place] [address]   MRSA PCR Screening     Status: None   Collection Time: 03/22/19  4:11 AM   Specimen: Nasopharyngeal  Result Value Ref Range Status   MRSA by PCR NEGATIVE NEGATIVE Final    Comment:        The GeneXpert MRSA Assay (FDA approved for NASAL specimens only), is one component of a comprehensive MRSA colonization surveillance program. It is not intended to diagnose MRSA infection nor to guide or monitor treatment for MRSA infections. Performed at [facility], [place] [address], [place], [place] [address]          Radiology Studies: Dg Chest 2 View  Result Date: 03/23/2019 CLINICAL DATA:  Shortness of breath and cough EXAM: CHEST - 2 VIEW COMPARISON:  Chest radiograph 03/21/2019 FINDINGS: Monitoring leads overlie the patient. Stable cardiomegaly status post median sternotomy. Pulmonary vascular  redistribution and mild interstitial opacities bilaterally. No pleural effusion or pneumothorax. Thoracic spine degenerative changes. IMPRESSION: Cardiomegaly and interstitial opacities suggestive of mild interstitial edema. Electronically Signed   By: Lovey Newcomer M.D.   On: 03/23/2019 11:51   Ct Head Wo Contrast  Result Date: 03/21/2019 CLINICAL DATA:  Altered level of consciousness, shortness of Breath EXAM: CT HEAD WITHOUT CONTRAST TECHNIQUE: Contiguous axial images were obtained from the base of the skull through the vertex without intravenous contrast. COMPARISON:  04/30/2018 FINDINGS: Brain: No acute intracranial abnormality. Specifically, no hemorrhage, hydrocephalus, mass lesion, acute infarction, or significant intracranial injury. Stable encephalomalacia at the right CP angle and overlying craniectomy defect. Vascular: No hyperdense vessel or unexpected calcification. Skull: No acute calvarial abnormality. Sinuses/Orbits: Visualized paranasal sinuses and mastoids clear. Orbital soft tissues unremarkable. Other: Prior right temporal occipital craniectomy. IMPRESSION: No acute intracranial abnormality. Electronically Signed   By: Rolm Baptise M.D.   On: 03/21/2019 23:17   Dg Chest Port 1 View  Result Date: 03/21/2019 CLINICAL DATA:  Shortness of breath and lethargy EXAM: PORTABLE CHEST 1 VIEW COMPARISON:  February 23, 2019. FINDINGS: There is cardiomegaly with pulmonary venous hypertension. There is slight interstitial edema. There is no consolidation. Patient is status post coronary artery bypass grafting. No bone lesions. IMPRESSION: Pulmonary vascular congestion with mild interstitial edema. There is felt to be a degree of congestive heart failure. No consolidation. Cardiomegaly is stable compared to most recent study. Electronically Signed   By: Lowella Grip III M.D.   On: 03/21/2019 21:44        Scheduled Meds: . amiodarone  100 mg Oral Daily  . apixaban  5 mg Oral BID  . carvedilol   12.5 mg Oral BID WC  . chlorhexidine  15 mL Mouth Rinse BID  . Chlorhexidine Gluconate Cloth  6 each Topical Daily  . furosemide  60 mg Intravenous BID  . insulin aspart  0-20 Units Subcutaneous TID WC  . insulin aspart  0-5 Units Subcutaneous QHS  . insulin glargine  40 Units Subcutaneous QHS  . isosorbide mononitrate  30 mg Oral Daily  .  mouth rinse  15 mL Mouth Rinse q12n4p  . polyvinyl alcohol  1 drop Right Eye QID  . potassium chloride SA  40 mEq Oral BID  . rosuvastatin  20 mg Oral q1800  . sodium chloride flush  3 mL Intravenous Q12H  . topiramate  100 mg Oral QHS   Continuous Infusions: . sodium chloride    . cefTRIAXone (ROCEPHIN)  IV Stopped (03/22/19 2117)     LOS: 1 day    Time spent: over 30 min    Fayrene Helper, MD [facility] Pager AMION  If 7PM-7AM, please contact night-coverage [email or web address] Password [facility] 03/23/2019, 12:53 PM

## 2019-03-23 NOTE — Evaluation (Signed)
Physical Therapy Evaluation Patient Details Name: [name]. MRN: [id number] DOB: [birth year] Today's Date: 03/23/2019   History of Present Illness  Pt is a 58 y.o. male who presented to the ED with shortness of breath and somnolence. He was admitted with acute on chronic respiratory failure with hypoxia and CHF exacerbation. He has a PMH significant for brain tumor s/p craniotomy, CAD s/p CABG with LIMA to LAD, fibromyalgia, gout, afib, CHF, COPD on 3 L O2, CKD stage III, DM-2, CVA with residual right sided weakness.   Clinical Impression  Pt admitted as above and presenting with functional mobility limitations 2* generalized weakness, balance deficits, questionable safety awareness and ltd endurance.  Pt should progress to dc home with family assist.     Follow Up Recommendations Home health PT    Equipment Recommendations  None recommended by PT    Recommendations for Other Services       Precautions / Restrictions Precautions Precautions: Fall Restrictions Weight Bearing Restrictions: No      Mobility  Bed Mobility Overal bed mobility: Needs Assistance Bed Mobility: Supine to Sit     Supine to sit: Min assist;Mod assist;HOB elevated     General bed mobility comments: assist to bring trunk to upright sitting  Transfers Overall transfer level: Needs assistance Equipment used: Rolling walker (2 wheeled) Transfers: Sit to/from Stand Sit to Stand: Min assist;+2 physical assistance;+2 safety/equipment            Ambulation/Gait Ambulation/Gait assistance: Min assist;+2 safety/equipment Gait Distance (Feet): 60 Feet(twice with extended seated rest break between attempts) Assistive device: Rolling walker (2 wheeled) Gait Pattern/deviations: Step-through pattern;Decreased step length - right;Decreased step length - left;Shuffle;Trunk flexed;Wide base of support Gait velocity: decr   General Gait Details: cues for posture and position from RW,  increased  time with several standing rest breaks and one extended seated rest break  Stairs            Wheelchair Mobility    Modified Rankin (Stroke Patients Only)       Balance Overall balance assessment: Needs assistance Sitting-balance support: Feet supported;No upper extremity supported Sitting balance-Leahy Scale: Good     Standing balance support: Single extremity supported Standing balance-Leahy Scale: Fair                               Pertinent Vitals/Pain      Home Living Family/patient expects to be discharged to:: Private residence Living Arrangements: Spouse/significant other Available Help at Discharge: Family;Available PRN/intermittently Type of Home: Mobile home Home Access: Ramped entrance     Home Layout: One level Home Equipment: [place] - 4 wheels;Bedside commode Additional Comments: sponge bathes at baseline, does not do shower transfs    Prior Function Level of Independence: Needs assistance   Gait / Transfers Assistance Needed: Rollator  ADL's / Homemaking Assistance Needed: Assist for bathing but independent for other ADL.   Comments: Per chart, does have an aide for 2 hours/day     Hand Dominance   Dominant Hand: Right    Extremity/Trunk Assessment   Upper Extremity Assessment Upper Extremity Assessment: Generalized weakness    Lower Extremity Assessment Lower Extremity Assessment: Generalized weakness       Communication   Communication: Expressive difficulties  Cognition Arousal/Alertness: Awake/alert Behavior During Therapy: Flat affect Overall Cognitive Status: Within Functional Limits for tasks assessed  General Comments      Exercises     Assessment/Plan    PT Assessment Patient needs continued PT services  PT Problem List Decreased strength;Decreased activity tolerance;Decreased balance;Decreased mobility;Decreased knowledge of use of  DME;Obesity       PT Treatment Interventions DME instruction;Gait training;Functional mobility training;Therapeutic activities;Therapeutic exercise;Balance training;Patient/family education    PT Goals (Current goals can be found in the Care Plan section)  Acute Rehab PT Goals Patient Stated Goal: Regain IND and return home with wife PT Goal Formulation: With patient Time For Goal Achievement: 04/06/19 Potential to Achieve Goals: Fair    Frequency Min 3X/week   Barriers to discharge        Co-evaluation               AM-PAC PT "6 Clicks" Mobility  Outcome Measure Help needed turning from your back to your side while in a flat bed without using bedrails?: A Little Help needed moving from lying on your back to sitting on the side of a flat bed without using bedrails?: A Little Help needed moving to and from a bed to a chair (including a wheelchair)?: A Little Help needed standing up from a chair using your arms (e.g., wheelchair or bedside chair)?: A Lot Help needed to walk in hospital room?: A Lot Help needed climbing 3-5 steps with a railing? : A Lot 6 Click Score: 15    End of Session Equipment Utilized During Treatment: Oxygen Activity Tolerance: Patient tolerated treatment well;Patient limited by fatigue Patient left: in chair;with call bell/phone within reach;with nursing/sitter in room Nurse Communication: Mobility status PT Visit Diagnosis: Difficulty in walking, not elsewhere classified (R26.2);Muscle weakness (generalized) (M62.81)    Time: 3810-1751 PT Time Calculation (min) (ACUTE ONLY): 38 min   Charges:   PT Evaluation $PT Eval Low Complexity: 1 Low PT Treatments $Gait Training: 23-37 mins        Debe Coder PT Acute Rehabilitation Services Pager [phone] Office [phone]   Jenean Escandon 03/23/2019, 12:56 PM

## 2019-03-23 NOTE — [place] Notes (Signed)
Pt seen, HR64, rr15, spo2 96% on 4lnc.  This writer attempted to place pt back on bipap, but he adamantly refused.  RN aware.  Bipap remains in room on standby.

## 2019-03-23 NOTE — [place] Notes (Signed)
Patient remains off BiPAP at this time.

## 2019-03-23 NOTE — Evaluation (Signed)
Clinical/Bedside Swallow Evaluation Patient Details  Name: [name]. MRN: [id number] Date of Birth: 20-Sep-1960  Today's Date: 03/23/2019 Time: SLP Start Time (ACUTE ONLY): 3212 SLP Stop Time (ACUTE ONLY): 1314 SLP Time Calculation (min) (ACUTE ONLY): 10 min  Past Medical History:  Past Medical History:  Diagnosis Date  . Brain tumor ([place])    Takes Topamax so patient will not have headaches  . Bruises easily   . CAD (coronary artery disease)    Cath 09, 99% LAD  . Cardiomyopathy, ischemic    Ischemic  . CHF (congestive heart failure) ([place])   . Cyst near coccyx   . Cyst near tailbone   . Diabetes mellitus without complication (HCC)    borderline  . Edema of both legs    Takes Lasix  . Fibromyalgia   . Gout   . Headache(784.0)    with lights  . Hypertension    dr Percival Spanish  . Kidney stones   . Myocardial infarction ([place])   . Obesity   . Pneumonia    hx of  . Shortness of breath   . Stroke [facility])    Affected vision, walking, and facial drooping on right face   Past Surgical History:  Past Surgical History:  Procedure Laterality Date  . 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 ANAL FISTULECTOMY    . CARDIOVERSION N/A 05/08/2018   Procedure: CARDIOVERSION;  Surgeon: [name], MD;  Location: [facility] ENDOSCOPY;  Service: Cardiovascular;  Laterality: N/A;  . CORONARY ARTERY BYPASS GRAFT     LIMA to LAD 2009  . CRANIOTOMY Right 11/20/2012   Procedure: CRANIOTOMY REPAIR DURAL/CENTRAL SPINAL FLUID LEAK;  Surgeon: [name], MD;  Location: [place] NEURO ORS;  Service: Neurosurgery;  Laterality: Right;  . EYE SURGERY     to coorect lid and double vision  . MEDIAN RECTUS REPAIR Right 02/16/2016   Procedure: RIGHT LATERAL RECTUS RESECTION; SUPERIOR RECTUS RESECTION RIGHT EYE; RIGHT SUPERIOR RECTUS RECESSION; LATERAL TARSAL STRIP RIGHT LOWER  EYELID;  Surgeon: [name], MD;  Location: [place];  Service: Ophthalmology;  Laterality: Right;  . PLACEMENT OF LUMBAR DRAIN N/A 11/20/2012   Procedure: Attempted PLACEMENT OF LUMBAR DRAIN;  Surgeon: [name], MD;  Location: [place] NEURO ORS;  Service: Neurosurgery;  Laterality: N/A;  . RETROSIGMOID CRANIECTOMY FOR TUMOR RESECTION Right 10/11/2012   Procedure: RETROSIGMOID CRANIECTOMY FOR TUMOR RESECTION;  Surgeon: [name], MD;  Location: [place] NEURO ORS;  Service: Neurosurgery;  Laterality: Right;  Craniotomy for acoustic neuroma  . TEE WITHOUT CARDIOVERSION N/A 05/08/2018   Procedure: TRANSESOPHAGEAL ECHOCARDIOGRAM (TEE);  Surgeon: [name], MD;  Location: [facility] ENDOSCOPY;  Service: Cardiovascular;  Laterality: N/A;  . TRACHEOSTOMY TUBE PLACEMENT N/A 10/11/2012   Procedure: TRACHEOSTOMY;  Surgeon: Ascencion Dike, MD;  Location: [facility] NEURO [facility]S;  Service: ENT;  Laterality: N/A;  . UMBILICAL HERNIA REPAIR     HPI:  Pt is a 58 y.o. male who presented to the ED with shortness of breath and somnolence. He was admitted with acute on chronic respiratory failure with hypoxia and CHF exacerbation. He has a PMH significant for brain tumor s/p craniotomy, CAD s/p CABG with LIMA to LAD, fibromyalgia, gout, afib, CHF, COPD on 3 L O2, CKD stage III, DM-2, CVA with residual right sided weakness. Pt has had multiple prior swallow evals, most recent  recommended to have soft to regular solids and thin liquids.    Assessment / Plan / Recommendation Clinical Impression  Pt has baseline R sided facial weakness and no dentition but compensates with Mod I to consume a variety of solids and thin liquids. No overt dysphagia or signs of aspiration are noted and he has no subjective complaints. Would continue with regular solids and thin liquids. SLP to sign off.   SLP Visit Diagnosis: Dysphagia, unspecified (R13.10)    Aspiration Risk  No limitations    Diet Recommendation Regular;Thin liquid   Liquid  Administration via: Cup;Straw Medication Administration: Whole meds with liquid Supervision: Patient able to self feed;Intermittent supervision to cue for compensatory strategies Compensations: Slow rate;Small sips/bites Postural Changes: Seated upright at 90 degrees    Other  Recommendations Oral Care Recommendations: Oral care BID   Follow up Recommendations None      Frequency and Duration            Prognosis        Swallow Study   General HPI: Pt is a 58 y.o. male who presented to the ED with shortness of breath and somnolence. He was admitted with acute on chronic respiratory failure with hypoxia and CHF exacerbation. He has a PMH significant for brain tumor s/p craniotomy, CAD s/p CABG with LIMA to LAD, fibromyalgia, gout, afib, CHF, COPD on 3 L O2, CKD stage III, DM-2, CVA with residual right sided weakness. Pt has had multiple prior swallow evals, most recent recommended to have soft to regular solids and thin liquids.  Type of Study: Bedside Swallow Evaluation Previous Swallow Assessment: see HPI Diet Prior to this Study: Regular;Thin liquids Temperature Spikes Noted: No Respiratory Status: Nasal cannula History of Recent Intubation: No Behavior/Cognition: Alert;Cooperative;Pleasant mood Oral Cavity Assessment: Within Functional Limits Oral Care Completed by SLP: No Oral Cavity - Dentition: Edentulous Vision: Functional for self-feeding Self-Feeding Abilities: Able to feed self Patient Positioning: Upright in chair Baseline Vocal Quality: Normal Volitional Swallow: Able to elicit    Oral/Motor/Sensory Function Overall Oral Motor/Sensory Function: Moderate impairment(R facial weakness at baseline)   Ice Chips Ice chips: Not tested   Thin Liquid Thin Liquid: Within functional limits Presentation: Cup;Self Fed;Straw    Nectar Thick Nectar Thick Liquid: Not tested   Honey Thick Honey Thick Liquid: Not tested   Puree Puree: Within functional limits Presentation:  Self Fed;Spoon   Solid     Solid: Within functional limits Presentation: Self Fed      Mickel Baas P Lydon Vansickle 03/23/2019,1:21 PM  Pollyann Glen, M.A. [place] Acute Environmental education officer [phone] Office [phone]

## 2019-03-24 ENCOUNTER — Other Ambulatory Visit: Payer: Self-pay

## 2019-03-24 LAB — COMPREHENSIVE METABOLIC PANEL
ALT: 23 U/L (ref 0–44)
AST: 19 U/L (ref 15–41)
Albumin: 3.1 g/dL — ABNORMAL LOW (ref 3.5–5.0)
Alkaline Phosphatase: 255 U/L — ABNORMAL HIGH (ref 38–126)
Anion gap: 12 (ref 5–15)
BUN: 39 mg/dL — ABNORMAL HIGH (ref 6–20)
CO2: 34 mmol/L — ABNORMAL HIGH (ref 22–32)
Calcium: 8.6 mg/dL — ABNORMAL LOW (ref 8.9–10.3)
Chloride: 95 mmol/L — ABNORMAL LOW (ref 98–111)
Creatinine, Ser: 2.77 mg/dL — ABNORMAL HIGH (ref 0.61–1.24)
GFR calc Af Amer: 28 mL/min — ABNORMAL LOW (ref >60)
GFR calc non Af Amer: 24 mL/min — ABNORMAL LOW (ref >60)
Glucose, Bld: 169 mg/dL — ABNORMAL HIGH (ref 70–99)
Potassium: 3.6 mmol/L (ref 3.5–5.1)
Sodium: 141 mmol/L (ref 135–145)
Total Bilirubin: 0.4 mg/dL (ref 0.3–1.2)
Total Protein: 7.8 g/dL (ref 6.5–8.1)

## 2019-03-24 LAB — GLUCOSE, CAPILLARY
Glucose-Capillary: 170 mg/dL — ABNORMAL HIGH (ref 70–99)
Glucose-Capillary: 197 mg/dL — ABNORMAL HIGH (ref 70–99)
Glucose-Capillary: 222 mg/dL — ABNORMAL HIGH (ref 70–99)
Glucose-Capillary: 220 mg/dL — ABNORMAL HIGH (ref 70–99)
Glucose-Capillary: 157 mg/dL — ABNORMAL HIGH (ref 70–99)

## 2019-03-24 LAB — URINE CULTURE: Culture: >=100000 — AB

## 2019-03-24 LAB — CBC
HCT: 41.3 % (ref 39.0–52.0)
Hemoglobin: 11.4 g/dL — ABNORMAL LOW (ref 13.0–17.0)
MCH: 29 pg (ref 26.0–34.0)
MCHC: 27.6 g/dL — ABNORMAL LOW (ref 30.0–36.0)
MCV: 105.1 fL — ABNORMAL HIGH (ref 80.0–100.0)
Platelets: 253 10*3/uL (ref 150–400)
RBC: 3.93 MIL/uL — ABNORMAL LOW (ref 4.22–5.81)
RDW: 13.2 % (ref 11.5–15.5)
WBC: 11.8 10*3/uL — ABNORMAL HIGH (ref 4.0–10.5)
nRBC: 0 % (ref 0.0–0.2)

## 2019-03-24 LAB — MAGNESIUM: Magnesium: 2.4 mg/dL (ref 1.7–2.4)

## 2019-03-24 NOTE — Progress Notes (Signed)
PROGRESS NOTE    [name].  PJK:[id number] DOB: 10-11-1960 DOA: 03/21/2019 PCP: Frank Macadam, MD   Brief Narrative:  [name]. is [name] 58 y.o. male with medical history significant for cva with right sided facial droop, acoustic neuroma, CAD s/p cabg 2009, htn, gout, insulin-dependent dm, dCHF, paf s/p ablation on anticoagulation, ckd3/4, chronic hypoxic respiratory failure on 3 L [place] O2 at home, morbid obesity, who presents with above.   History, unfortunately, obtained mainly from ED staff as patient remains confused and unable to reach patient's wife.   Per report patient's wife alerted EMS for several days worsening shortness of breath, somnolence, and confusion. No reported chest pain or significant cough. Reportedly compliant with medications. On 3 L [place] O2 at home. Per report also has had worsening lower extremity swelling.  Assessment & Plan:   Principal Problem:   Acute on chronic respiratory failure with hypoxia and hypercapnia (HCC) Active Problems:   CAD (coronary artery disease) of artery bypass graft   Obesity hypoventilation syndrome (HCC)   Acoustic neuroma (HCC)   Status post craniotomy   Facial nerve palsy   Pressure injury of skin   Acute diastolic heart failure (HCC)   CKD (chronic kidney disease) stage 3, GFR 30-59 ml/min (HCC)   Insulin dependent diabetes mellitus ([name])   Morbid obesity with BMI of 50.0-59.9, adult (HCC)   Respiratory failure ([name])   # Acute on chronic respiratory failure with hypoxia and hypercarbia  - Pt with heart failure exacerbation and likely with OHS/OSA - I also suspect COPD given hypercarbia -> he does have significant smoking hx, but hasn't smoked since 2016 - no wheezing on examination - CXR with mild interstitial edema - He's on 60 mg torsemide BID at home -> was increased to 100 mg BID here for diuresis - per cards, will transition to lasix 60 mg BID (on hold today due to renal function) -  Scheduled and prn nebulizers for likely underlying COPD, will hold off on steroids at this time (will plan to send home with LAMA) - repeat CXR with mild interstitial edema - Continue nightly bipap - he needs an outpatient sleep study   # Acute metabolic encephalopathy - likely 2/2 hypercarbia, has improved, he's much better today - follow B12, TSH, ammonia  - continue above treatment for hypercarbia - head CT without acute abnormality  # Elevated Troponin: initial troponin of 144 -> 225.  Pt is asymptomatic without chest pain.  EKG without acute findings.  I suspect this is demand ischemia due to his HF exacerbation. - appreciate cardiology recommendations - restart crestor  # aki on ckd 3 - cr 2.53 today, up from baseline of ~2.2-2.3 - likely 2/2 diuresis - avoid nephrotoxins. Has been outpt nephrology referred - hold diuretics today  # elevated alk phos - f/u ggt -> slightly elevated   # macrocytosis - recent b12, folate, tsh wnl - f/u peripheral smear -> pending, ldh (wnl)  # urine nitrites - no reported hx of symptoms - f/u culture (growing providencia), will continue ceftriaxone for now, follow susceptibilities  # chronic pain - resume home pain meds, will need caution due to hypercarbia above  # Sacral decubitus ulcer - appeared to be stage 1 on my exam today (no significant wound to photograph), will continue local care, frequent turning, foam - appreciate wound care recs  # Paroxysmal [name]-fib # diastolic chf - hx cardioversion.  - continue apixaban, amiodarone, coreg, imdur - torsemide as  above  # T2dm - here glucose elvated to 190. Recent a1c 8.8% - decrease daily lantus from 46 to 40, start ssi   DVT prophylaxis: eliquis Code Status: full  Family Communication: called wife 8/2 Disposition Plan: pending further improvement  Consultants:   cardiology  Procedures:  none  Antimicrobials:  Anti-infectives (From admission, onward)   Start      Dose/Rate Route Frequency Ordered Stop   03/22/19 2000  cefTRIAXone (ROCEPHIN) 1 g in sodium chloride 0.9 % 100 mL IVPB     1 g 200 mL/hr over 30 Minutes Intravenous Every 24 hours 03/22/19 1902          Subjective: [name]&Ox3 Doing ok, no complaints  Objective: Vitals:   03/24/19 0600 03/24/19 0618 03/24/19 0800 03/24/19 1300  BP: (!) 191/102 (!) 152/71    Pulse: 69 78    Resp: (!) 25 18    Temp:   97.6 F (36.4 C) 97.9 F (36.6 C)  TempSrc:   Oral Oral  SpO2: 96% 95%    Weight:      Height:        Intake/Output Summary (Last 24 hours) at 03/24/2019 1532 Last data filed at 03/24/2019 0600 Gross per 24 hour  Intake 938.23 ml  Output 2850 ml  Net -1911.77 ml   Filed Weights   03/22/19 0411 03/23/19 0440 03/24/19 0500  Weight: (!) 169.4 kg (!) 171.7 kg (!) 168.9 kg    Examination:  General: No acute distress. Cardiovascular: Heart sounds show [name] regular rate, and rhythm Lungs: Clear to auscultation bilaterally Abdomen: Soft, nontender, nondistended Neurological: Alert and oriented 3. Moves all extremities 4. Cranial nerves II through XII grossly intact. Skin: stage 1 decubitus to buttocks Extremities: No clubbing or cyanosis. Bilateral LE edema  Data Reviewed: I have personally reviewed following labs and imaging studies  CBC: Recent Labs  Lab 03/19/19 1032 03/21/19 2128 03/22/19 0730 03/23/19 0803 03/24/19 0219  WBC 6.2 7.5 8.3 8.6 11.8*  NEUTROABS 4.0 5.0  --   --   --   HGB 12.7* 11.5* 11.3* 11.5* 11.4*  HCT 41.4 42.2 39.9 39.9 41.3  MCV 98.9 107.4* 103.9* 105.8* 105.1*  PLT 214.0 205 178 215 031   Basic Metabolic Panel: Recent Labs  Lab 03/19/19 1032 03/21/19 2128 03/22/19 0730 03/23/19 0803 03/24/19 0219  NA 141 143 144 144 141  K 4.5 4.2 3.8 4.0 3.6  CL 100 98 98 98 95*  CO2 31 36* 32 35* 34*  GLUCOSE 244* 190* 160* 157* 169*  BUN 25* 25* 25* 34* 39*  CREATININE 2.33* 2.34* 2.37* 2.53* 2.77*  CALCIUM 8.8 8.9 8.8* 9.0 8.6*  MG  --   --  2.1  2.4 2.4   GFR: Estimated Creatinine Clearance: 46.3 mL/min ([name]) (by C-G formula based on SCr of 2.77 mg/dL (H)). Liver Function Tests: Recent Labs  Lab 03/19/19 1032 03/21/19 2128 03/22/19 0730 03/23/19 0803 03/24/19 0219  AST 17 19 20 21 19   ALT 18 23 23 23 23   ALKPHOS 330* 259* 250* 257* 255*  BILITOT 0.3 0.5 0.5 0.6 0.4  PROT 7.4 7.9 7.1 7.6 7.8  ALBUMIN 3.6 3.2* 3.0* 3.2* 3.1*   No results for input(s): LIPASE, AMYLASE in the last 168 hours. Recent Labs  Lab 03/22/19 1927  AMMONIA 30   Coagulation Profile: Recent Labs  Lab 03/21/19 2128  INR 1.1   Cardiac Enzymes: No results for input(s): CKTOTAL, CKMB, CKMBINDEX, TROPONINI in the last 168 hours. BNP (last 3 results)  No results for input(s): PROBNP in the last 8760 hours. HbA1C: No results for input(s): HGBA1C in the last 72 hours. CBG: Recent Labs  Lab 03/23/19 1217 03/23/19 1708 03/23/19 2201 03/24/19 0745 03/24/19 1135  GLUCAP 267* 156* 170* 197* 222*   Lipid Profile: No results for input(s): CHOL, HDL, LDLCALC, TRIG, CHOLHDL, LDLDIRECT in the last 72 hours. Thyroid Function Tests: Recent Labs    03/22/19 1927  TSH 0.570   Anemia Panel: Recent Labs    03/22/19 1927  VITAMINB12 562   Sepsis Labs: No results for input(s): PROCALCITON, LATICACIDVEN in the last 168 hours.  Recent Results (from the past 240 hour(s))  Urine culture     Status: Abnormal   Collection Time: 03/21/19  9:29 PM   Specimen: Urine, Random  Result Value Ref Range Status   Specimen Description   Final    URINE, RANDOM Performed at [facility] [address], [place], [place] [address]    Special Requests   Final    NONE Performed at [facility], [place] [address], [place], [place] [address]    Culture >=100,000 COLONIES/mL PROVIDENCIA RETTGERI ([name])  Final   Report Status 03/24/2019 FINAL  Final   Organism ID, Bacteria PROVIDENCIA RETTGERI ([place])  Final      Susceptibility    Providencia rettgeri - MIC*    AMPICILLIN >=32 RESISTANT Resistant     CEFAZOLIN >=64 RESISTANT Resistant     CEFTRIAXONE <=1 SENSITIVE Sensitive     CIPROFLOXACIN <=0.25 SENSITIVE Sensitive     GENTAMICIN <=1 SENSITIVE Sensitive     IMIPENEM 1 SENSITIVE Sensitive     NITROFURANTOIN 256 RESISTANT Resistant     TRIMETH/SULFA >=320 RESISTANT Resistant     AMPICILLIN/SULBACTAM 16 INTERMEDIATE Intermediate     PIP/TAZO <=4 SENSITIVE Sensitive     * >=100,000 COLONIES/mL PROVIDENCIA RETTGERI  SARS Coronavirus 2 [facility] order, Performed in [place] hospital lab)     Status: None   Collection Time: 03/21/19  9:30 PM  Result Value Ref Range Status   SARS Coronavirus 2 NEGATIVE NEGATIVE Final    Comment: (NOTE) If result is NEGATIVE SARS-CoV-2 target nucleic acids are NOT DETECTED. The SARS-CoV-2 RNA is generally detectable in upper and lower  respiratory specimens during the acute phase of infection. The lowest  concentration of SARS-CoV-2 viral copies this assay can detect is 250  copies / mL. [name] negative result does not preclude SARS-CoV-2 infection  and should not be used as the sole basis for treatment or other  patient management decisions.  [name] negative result may occur with  improper specimen collection / handling, submission of specimen other  than nasopharyngeal swab, presence of viral mutation(s) within the  areas targeted by this assay, and inadequate number of viral copies  (<250 copies / mL). Kathrine Rieves negative result must be combined with clinical  observations, patient history, and epidemiological information. If result is POSITIVE SARS-CoV-2 target nucleic acids are DETECTED. The SARS-CoV-2 RNA is generally detectable in upper and lower  respiratory specimens dur ing the acute phase of infection.  Positive  results are indicative of active infection with SARS-CoV-2.  Clinical  correlation with patient history and other diagnostic information is  necessary to determine patient  infection status.  Positive results do  not rule out bacterial infection or co-infection with other viruses. If result is PRESUMPTIVE POSTIVE SARS-CoV-2 nucleic acids MAY BE PRESENT.   Kjirsten Bloodgood presumptive positive result was obtained on the submitted specimen  and confirmed  on repeat testing.  While 2019 novel coronavirus  (SARS-CoV-2) nucleic acids may be present in the submitted sample  additional confirmatory testing may be necessary for epidemiological  and / or clinical management purposes  to differentiate between  SARS-CoV-2 and other Sarbecovirus currently known to infect humans.  If clinically indicated additional testing with an alternate test  methodology [phone]) is advised. The SARS-CoV-2 RNA is generally  detectable in upper and lower respiratory sp ecimens during the acute  phase of infection. The expected result is Negative. Fact Sheet for Patients:  StrictlyIdeas.no Fact Sheet for Healthcare Providers: BankingDealers.co.za This test is not yet approved or cleared by the [place] FDA and has been authorized for detection and/or diagnosis of SARS-CoV-2 by FDA under an Emergency Use Authorization (EUA).  This EUA will remain in effect (meaning this test can be used) for the duration of the COVID-19 declaration under Section 564(b)(1) of the Act, 21 U.S.C. section 360bbb-3(b)(1), unless the authorization is terminated or revoked sooner. Performed at [facility], [place] [address] [place], [place] [address]   MRSA PCR Screening     Status: None   Collection Time: 03/22/19  4:11 AM   Specimen: Nasopharyngeal  Result Value Ref Range Status   MRSA by PCR NEGATIVE NEGATIVE Final    Comment:        The GeneXpert MRSA Assay (FDA approved for NASAL specimens only), is one component of Karon Cotterill comprehensive MRSA colonization surveillance program. It is not intended to diagnose MRSA infection nor to guide or  monitor treatment for MRSA infections. Performed at [facility], [place] [address] [place], [place] [address]          Radiology Studies: Dg Chest 2 View  Result Date: 03/23/2019 CLINICAL DATA:  Shortness of breath and cough EXAM: CHEST - 2 VIEW COMPARISON:  Chest radiograph 03/21/2019 FINDINGS: Monitoring leads overlie the patient. Stable cardiomegaly status post median sternotomy. Pulmonary vascular redistribution and mild interstitial opacities bilaterally. No pleural effusion or pneumothorax. Thoracic spine degenerative changes. IMPRESSION: Cardiomegaly and interstitial opacities suggestive of mild interstitial edema. Electronically Signed   By: Lovey Newcomer M.D.   On: 03/23/2019 11:51        Scheduled Meds: . amiodarone  100 mg Oral Daily  . apixaban  5 mg Oral BID  . carvedilol  12.5 mg Oral BID WC  . chlorhexidine  15 mL Mouth Rinse BID  . Chlorhexidine Gluconate Cloth  6 each Topical Daily  . insulin aspart  0-20 Units Subcutaneous TID WC  . insulin aspart  0-5 Units Subcutaneous QHS  . insulin glargine  40 Units Subcutaneous QHS  . isosorbide mononitrate  30 mg Oral Daily  . mouth rinse  15 mL Mouth Rinse q12n4p  . polyvinyl alcohol  1 drop Right Eye QID  . potassium chloride SA  40 mEq Oral BID  . rosuvastatin  20 mg Oral q1800  . sodium chloride flush  3 mL Intravenous Q12H  . topiramate  100 mg Oral QHS   Continuous Infusions: . sodium chloride    . cefTRIAXone (ROCEPHIN)  IV Stopped (03/23/19 2305)     LOS: 2 days    Time spent: over 30 min    Fayrene Helper, MD [facility] Pager AMION  If 7PM-7AM, please contact night-coverage [email or web address] Password [facility] 03/24/2019, 3:32 PM

## 2019-03-24 NOTE — TOC Benefit Eligibility Note (Signed)
Transition of Care [facility]) Benefit Eligibility Note    Patient Details  Name: [name]. MRN: [id number] Date of Birth: 02-24-1961   Medication/Dose: [name] and [name]  Covered?: Yes  Tier: 3 Drug  Prescription Coverage Preferred Pharmacy: local  Spoke with Person/Company/Phone Number:: Kelli/ [employer] [phone] opt2  Co-Pay: $45.00  Prior Approval: No  Deductible: Met       Kerin Salen Phone Number: 03/24/2019, 10:09 AM

## 2019-03-24 NOTE — Progress Notes (Addendum)
Progress Note  Patient Name: [name].  Date of Encounter: 03/24/2019  Primary Cardiologist: [name], MD   Subjective   Pt says his breathing is better.  He says his confusion cleared up once he was given his hydrocodone for his eye.  He is laying on his left side, says he is waiting for someone to clean him up.  He denies chest pain/pressure.  He does feel like his volume status is a little improved.  Inpatient Medications    Scheduled Meds: . amiodarone  100 mg Oral Daily  . apixaban  5 mg Oral BID  . carvedilol  12.5 mg Oral BID WC  . chlorhexidine  15 mL Mouth Rinse BID  . Chlorhexidine Gluconate Cloth  6 each Topical Daily  . insulin aspart  0-20 Units Subcutaneous TID WC  . insulin aspart  0-5 Units Subcutaneous QHS  . insulin glargine  40 Units Subcutaneous QHS  . isosorbide mononitrate  30 mg Oral Daily  . mouth rinse  15 mL Mouth Rinse q12n4p  . polyvinyl alcohol  1 drop Right Eye QID  . potassium chloride SA  40 mEq Oral BID  . rosuvastatin  20 mg Oral q1800  . sodium chloride flush  3 mL Intravenous Q12H  . topiramate  100 mg Oral QHS   Continuous Infusions: . sodium chloride    . cefTRIAXone (ROCEPHIN)  IV Stopped (03/23/19 2305)   PRN Meds: sodium chloride, artificial tears, HYDROcodone-acetaminophen, ipratropium-albuterol, nystatin, polyvinyl alcohol, sodium chloride flush, tiZANidine   Vital Signs    Vitals:   03/24/19 0500 03/24/19 0600 03/24/19 0618 03/24/19 0800  BP: (!) 158/74 (!) 191/102 (!) 152/71   Pulse: 64 69 78   Resp: 19 (!) 25 18   Temp:    97.6 F (36.4 C)  TempSrc:    Oral  SpO2: 95% 96% 95%   Weight: (!) 168.9 kg     Height:        Intake/Output Summary (Last 24 hours) at 03/24/2019 1107 Last data filed at 03/24/2019 0600 Gross per 24 hour  Intake 938.23 ml  Output 2850 ml  Net -1911.77 ml   Last 3 Weights 03/24/2019 03/23/2019 03/22/2019  Weight (lbs) 372 lb 5.7 oz 378 lb 8.5 oz 373 lb 7.4 oz  Weight (kg) 168.9  kg 171.7 kg 169.4 kg      Telemetry    Sinus rhythm 60s-70s- Personally Reviewed  ECG    No new tracings- Personally Reviewed  Physical Exam   GEN:  Morbidly obese male no acute distress.   Neck: No JVD, difficult to assess due to body habitus Cardiac: RRR, no murmurs, rubs, or gallops.  Heart sounds distant Respiratory: Clear to auscultation bilaterally. GI: Soft, nontender, non-distended  MS:  Trace-1+ pedal edema; darkened skin over the lower legs and feet Neuro:  Nonfocal  Psych: Normal affect, mildly agitated due to "need to be cleaned up"  Labs    High Sensitivity Troponin:   Recent Labs  Lab 03/22/19 0723 03/22/19 1017  TROPONINIHS 144* 225*      Cardiac EnzymesNo results for input(s): TROPONINI in the last 168 hours. No results for input(s): TROPIPOC in the last 168 hours.   Chemistry Recent Labs  Lab 03/22/19 0730 03/23/19 0803 03/24/19 0219  NA 144 144 141  K 3.8 4.0 3.6  CL 98 98 95*  CO2 32 35* 34*  GLUCOSE 160* 157* 169*  BUN 25* 34* 39*  CREATININE 2.37* 2.53* 2.77*  CALCIUM  8.8* 9.0 8.6*  PROT 7.1 7.6 7.8  ALBUMIN 3.0* 3.2* 3.1*  AST 20 21 19   ALT 23 23 23   ALKPHOS 250* 257* 255*  BILITOT 0.5 0.6 0.4  GFRNONAA 29* 27* 24*  GFRAA 34* 31* 28*  ANIONGAP 14 11 12      Hematology Recent Labs  Lab 03/22/19 0730 03/23/19 0803 03/24/19 0219  WBC 8.3 8.6 11.8*  RBC 3.84* 3.77* 3.93*  HGB 11.3* 11.5* 11.4*  HCT 39.9 39.9 41.3  MCV 103.9* 105.8* 105.1*  MCH 29.4 30.5 29.0  MCHC 28.3* 28.8* 27.6*  RDW 13.2 13.4 13.2  PLT 178 215 253    BNP Recent Labs  Lab 03/21/19 2130  BNP 282.0*     DDimer No results for input(s): DDIMER in the last 168 hours.   Radiology    Dg Chest 2 View  Result Date: 03/23/2019 CLINICAL DATA:  Shortness of breath and cough EXAM: CHEST - 2 VIEW COMPARISON:  Chest radiograph 03/21/2019 FINDINGS: Monitoring leads overlie the patient. Stable cardiomegaly status post median sternotomy. Pulmonary vascular  redistribution and mild interstitial opacities bilaterally. No pleural effusion or pneumothorax. Thoracic spine degenerative changes. IMPRESSION: Cardiomegaly and interstitial opacities suggestive of mild interstitial edema. Electronically Signed   By: Lovey Newcomer M.D.   On: 03/23/2019 11:51    Cardiac Studies   Echocardiogram 02/22/2019 IMPRESSIONS   1. The left ventricle has normal systolic function, with an ejection fraction of 55-60%. The cavity size was mildly dilated. There is mildly increased left ventricular wall thickness.  2. Left atrial size was mildly dilated.  3. Extremely limited; definity used; normal LV systolic function; focal wall motion abnormality cannot be excluded; mild LVH; mild LVE.  Patient Profile     58 y.o. male with a hx of CAD s/p CABG 0175, chronic diastolic HF, afib on amiodarone and Eliquis, CKD who was admitted with altered mental status and shortness of breath.  Several days of worsening shortness of breath, lower extremity edema, somnolence and confusion.  Chest x-ray with pulmonary edema.  Assessment & Plan    Acute on chronic diastolic heart failure -Patient was on torsemide 60 mg twice daily at home -Patient was initially given torsemide 100 mg twice daily on 03/22/2019 and then diuresed with Lasix IV 60 mg for 3 doses.  Last dose yesterday evening.  Serum creatinine up to 2.77.  Diuretic currently on hold. -Patient had 4.2 L urine output yesterday and is net -4.17 L fluid balance since admission. -Symptoms seem to have improved.  Patient appears to be for the most part alert and oriented.  CAD s/p CABG 2009 -Troponin mildly elevated likely related to CHF and CKD.  EKG without acute ischemic changes and no specific chest pain. -Had been apparently off Crestor which is now resumed. -On Imdur 30 mg, statin, beta-blocker.  Not on aspirin due to need for anticoagulation. -Currently denies any chest pain/pressure  CKD -Serum creatinine increased with  diuresis, 2.34 >>2.77.  Diuretics now on hold. -Continue to monitor renal function  Atrial fibrillation -History of DCCV 04/2018 -Has been on amiodarone 100 mg daily and Coreg 12.5 mg twice daily.  Eliquis for stroke risk reduction. -Maintaining sinus rhythm.  Hypertension -Blood pressure has been elevated, 152/71 this morning. -No ARB/ACEi due to renal function. Could consider adding amlodipine if needed.      For questions or updates, please contact [place] Please consult [email or web address] for contact info under        Signed, Daune Perch, NP  03/24/2019, 11:07 AM    History and all data above reviewed.  Patient examined.  I agree with the findings as above.  I saw this patient previously and he looks much better than when I saw him.  Volume is actually good for him.  Denies SOB The patient exam reveals COR:RRR  ,  Lungs: Clear  ,  Abd: Obese, Ext Trace edema  .  All available labs, radiology testing, previous records reviewed. Agree with documented assessment and plan. Agree with holding Diuretic.  Probably home on previous dose.    Jeneen Rinks Byard Carranza  12:43 PM  03/24/2019

## 2019-03-24 NOTE — Patient Outreach (Signed)
  [facility]) Care Management Chronic Special Needs Program    03/24/2019  Name: Frank Wyszynski., DOB: [birth year]  MRN: [id number]   [name] is enrolled in a chronic special needs plan.  Client presented to the ED with complaint of confusion and SOB. Client was admitted on 03/21/2019 with acute on chronic respiratory failure and hypercapnia. Prior admission 02/22/2019-02/27/2019 with COPD exacerbation.  Plan: care coordination as needed. per policy/procedure care plan sent to hospital utilization management.  Thea Silversmith, RN, MSN, [place] [phone]

## 2019-03-24 NOTE — Progress Notes (Signed)
Pt continuously refusing BIPAP QHS even after RT provided education.  Pt remains on 3 LPM [place] and tolerating well at this time.  RT to monitor and assess as needed.

## 2019-03-25 DIAGNOSIS — J9622 Acute and chronic respiratory failure with hypercapnia: Secondary | ICD-10-CM

## 2019-03-25 DIAGNOSIS — J9621 Acute and chronic respiratory failure with hypoxia: Secondary | ICD-10-CM

## 2019-03-25 LAB — GLUCOSE, CAPILLARY
Glucose-Capillary: 171 mg/dL — ABNORMAL HIGH (ref 70–99)
Glucose-Capillary: 237 mg/dL — ABNORMAL HIGH (ref 70–99)
Glucose-Capillary: 164 mg/dL — ABNORMAL HIGH (ref 70–99)
Glucose-Capillary: 170 mg/dL — ABNORMAL HIGH (ref 70–99)

## 2019-03-25 LAB — COMPREHENSIVE METABOLIC PANEL
ALT: 25 U/L (ref 0–44)
AST: 21 U/L (ref 15–41)
Albumin: 3.1 g/dL — ABNORMAL LOW (ref 3.5–5.0)
Alkaline Phosphatase: 262 U/L — ABNORMAL HIGH (ref 38–126)
Anion gap: 8 (ref 5–15)
BUN: 38 mg/dL — ABNORMAL HIGH (ref 6–20)
CO2: 32 mmol/L (ref 22–32)
Calcium: 8.8 mg/dL — ABNORMAL LOW (ref 8.9–10.3)
Chloride: 101 mmol/L (ref 98–111)
Creatinine, Ser: 2.48 mg/dL — ABNORMAL HIGH (ref 0.61–1.24)
GFR calc Af Amer: 32 mL/min — ABNORMAL LOW (ref >60)
GFR calc non Af Amer: 28 mL/min — ABNORMAL LOW (ref >60)
Glucose, Bld: 150 mg/dL — ABNORMAL HIGH (ref 70–99)
Potassium: 4.4 mmol/L (ref 3.5–5.1)
Sodium: 141 mmol/L (ref 135–145)
Total Bilirubin: 0.3 mg/dL (ref 0.3–1.2)
Total Protein: 7.5 g/dL (ref 6.5–8.1)

## 2019-03-25 LAB — CBC
HCT: 42.7 % (ref 39.0–52.0)
Hemoglobin: 11.7 g/dL — ABNORMAL LOW (ref 13.0–17.0)
MCH: 29.4 pg (ref 26.0–34.0)
MCHC: 27.4 g/dL — ABNORMAL LOW (ref 30.0–36.0)
MCV: 107.3 fL — ABNORMAL HIGH (ref 80.0–100.0)
Platelets: 211 10*3/uL (ref 150–400)
RBC: 3.98 MIL/uL — ABNORMAL LOW (ref 4.22–5.81)
RDW: 13.2 % (ref 11.5–15.5)
WBC: 10.9 10*3/uL — ABNORMAL HIGH (ref 4.0–10.5)
nRBC: 0 % (ref 0.0–0.2)

## 2019-03-25 LAB — MAGNESIUM: Magnesium: 2.5 mg/dL — ABNORMAL HIGH (ref 1.7–2.4)

## 2019-03-25 MED ORDER — TORSEMIDE 20 MG PO TABS
60.0000 mg | ORAL_TABLET | Freq: Two times a day (BID) | ORAL | Status: DC
  Administered 2019-03-25 – 2019-03-27 (×4): 60 mg via ORAL
  Filled 2019-03-25 (×5): qty 3

## 2019-03-25 MED ORDER — ROSUVASTATIN CALCIUM 20 MG PO TABS: 20.0000 mg | ORAL_TABLET | Freq: Every day | ORAL | 0 refills | Status: AC

## 2019-03-25 MED ORDER — CEFDINIR 300 MG PO CAPS: 300.0000 mg | ORAL_CAPSULE | Freq: Two times a day (BID) | ORAL | 0 refills | Status: DC

## 2019-03-25 NOTE — Consult Note (Signed)
   [facility] CM Inpatient Consult   03/25/2019  [name] 1960/11/14 [id number]   Coverage for Netta Cedars, RN, [facility] Liaison for [facility]  Patient is currently active with [facility] for chronic disease management services. Patient has a high score 27% for unplanned readmission and less than 30 days rehospitalization.  Patient has been engaged by a [place] Management Coordinator, who is already aware of patient's admission.     Patient is in the [place].   Chart reviewed from Dr. Abel Presto progress notes from 03/24/2019 as follows:  Frank Arellanois a 58 y.o.malewith medical history significant forcva with right sided facial droop, acoustic neuroma, CAD s/p cabg 2009, htn, gout, insulin-dependent dm, dCHF, paf s/p ablation on anticoagulation, ckd3/4, chronic hypoxic respiratory failure on 3 L [place] O2 at home, morbid obesity, who presents with above.  History, unfortunately, obtained mainly from  ED staff as patient remains confused and unable to reach patient's wife.    Per report patient's wife alerted EMS for several days worsening shortness of breath, somnolence, and confusion. No reported chest pain or significant cough. Reportedly compliant with medications. On 3 L [place] O2 at home. Per report also has had worsening lower extremity swelling.  Plan: Patient will receive a post hospital call and will be evaluated for assessments and disease process education.    Will notify Inpatient [facility] team member to make aware that [facility] following.   Of note, [facility] Care Management services does not replace or interfere with any services that are needed or arranged by inpatient [facility] care management team.    For additional questions or referrals please contact:  Natividad Brood, RN BSN [facility] Liaison  [phone] business mobile phone Toll free office  [phone]  Fax number: [phone] [email or web address]

## 2019-03-25 NOTE — Progress Notes (Signed)
Physical Therapy Treatment Patient Details Name: [name]. MRN: [id number] DOB: Dec 04, 1960 Today's Date: 03/25/2019    History of Present Illness Pt is a 58 y.o. male who presented to the ED with shortness of breath and somnolence. He was admitted with acute on chronic respiratory failure with hypoxia and CHF exacerbation. He has a PMH significant for brain tumor s/p craniotomy, CAD s/p CABG with LIMA to LAD, fibromyalgia, gout, afib, CHF, COPD on 3 L O2, CKD stage III, DM-2, CVA with residual right sided weakness.     PT Comments    Pt assisted OOB and ambulated in hallway short distances (seated rest break required).  Pt reports having rollator at home.  Pt required 6L O2 [place] and SpO2 90% during ambulation.  Pt also picked out blot clot from his nose despite cues to leave alone and nose started bleeding (RN aware).   Follow Up Recommendations  Home health PT;Supervision for mobility/OOB     Equipment Recommendations  None recommended by PT    Recommendations for Other Services       Precautions / Restrictions Precautions Precautions: Fall Precaution Comments: monitor sats    Mobility  Bed Mobility Overal bed mobility: Needs Assistance Bed Mobility: Supine to Sit     Supine to sit: Mod assist     General bed mobility comments: assist for trunk upright, pt assisting as able  Transfers Overall transfer level: Needs assistance Equipment used: Rolling walker (2 wheeled) Transfers: Sit to/from Stand Sit to Stand: Min assist;+2 safety/equipment         General transfer comment: verbal cues for technique  Ambulation/Gait Ambulation/Gait assistance: Min assist;+2 safety/equipment Gait Distance (Feet): 40 Feet(x2) Assistive device: Rolling walker (2 wheeled) Gait Pattern/deviations: Step-through pattern;Decreased step length - right;Trunk flexed;Wide base of support     General Gait Details: verbal cues for posture and RW positioning, pt with wide BOS  and tends to keep RW too far forward, required one seated rest break, SpO2 90% on 5L O2 [place]   Stairs             Wheelchair Mobility    Modified Rankin (Stroke Patients Only)       Balance                                            Cognition Arousal/Alertness: Awake/alert Behavior During Therapy: Flat affect Overall Cognitive Status: Within Functional Limits for tasks assessed                                        Exercises      General Comments        Pertinent Vitals/Pain Pain Assessment: Faces Faces Pain Scale: Hurts little more Pain Location: back Pain Descriptors / Indicators: Sore Pain Intervention(s): Monitored during session;Repositioned    Home Living                      Prior Function            PT Goals (current goals can now be found in the care plan section) Progress towards PT goals: Progressing toward goals    Frequency    Min 3X/week      PT Plan Current plan remains appropriate    Co-evaluation  AM-PAC PT "6 Clicks" Mobility   Outcome Measure  Help needed turning from your back to your side while in a flat bed without using bedrails?: A Little Help needed moving from lying on your back to sitting on the side of a flat bed without using bedrails?: A Lot Help needed moving to and from a bed to a chair (including a wheelchair)?: A Little Help needed standing up from a chair using your arms (e.g., wheelchair or bedside chair)?: A Little Help needed to walk in hospital room?: A Little Help needed climbing 3-5 steps with a railing? : A Lot 6 Click Score: 16    End of Session Equipment Utilized During Treatment: Oxygen(flat sheet used as gait belt) Activity Tolerance: Patient tolerated treatment well;Patient limited by fatigue Patient left: in chair;with chair alarm set;with call bell/phone within reach   PT Visit Diagnosis: Difficulty in walking, not elsewhere  classified (R26.2);Muscle weakness (generalized) (M62.81)     Time: 5300-5110 PT Time Calculation (min) (ACUTE ONLY): 34 min  Charges:  $Gait Training: 23-37 mins                     Carmelia Bake, PT, DPT Acute Rehabilitation Services Office: [phone] Pager: [phone]  Trena Platt 03/25/2019, 1:22 PM

## 2019-03-25 NOTE — Progress Notes (Addendum)
PROGRESS NOTE    [name].  JJO:[id number] DOB: 1961/07/17 DOA: 03/21/2019 PCP: Caren Macadam, MD   Brief Narrative:  [name]. is [name] 58 y.o. male with medical history significant for cva with right sided facial droop, acoustic neuroma, CAD s/p cabg 2009, htn, gout, insulin-dependent dm, dCHF, paf s/p ablation on anticoagulation, ckd3/4, chronic hypoxic respiratory failure on 3 L [place] O2 at home, morbid obesity, who presents with above.   History, unfortunately, obtained mainly from ED staff as patient remains confused and unable to reach patient's wife.   Per report patient's wife alerted EMS for several days worsening shortness of breath, somnolence, and confusion. No reported chest pain or significant cough. Reportedly compliant with medications. On 3 L [place] O2 at home. Per report also has had worsening lower extremity swelling.  Assessment & Plan:   Principal Problem:   Acute on chronic respiratory failure with hypoxia and hypercapnia (HCC) Active Problems:   CAD (coronary artery disease) of artery bypass graft   Obesity hypoventilation syndrome (HCC)   Acoustic neuroma (HCC)   Status post craniotomy   Facial nerve palsy   Pressure injury of skin   Acute diastolic heart failure (HCC)   CKD (chronic kidney disease) stage 3, GFR 30-59 ml/min (HCC)   Insulin dependent diabetes mellitus ([place])   Morbid obesity with BMI of 50.0-59.9, adult ([place])   Respiratory failure ([place])   # Acute on chronic respiratory failure with hypoxia and hypercarbia  - Pt with heart failure exacerbation and likely with OHS/OSA - I also suspect COPD given hypercarbia -> he does have significant smoking hx, but hasn't smoked since 2016 - no wheezing on examination - CXR with mild interstitial edema - He's on 60 mg torsemide BID at home - lasix held yesterday due to bump in renal function - resume torsemide at 60 mg BID today - Scheduled and prn nebulizers for likely  underlying COPD, will hold off on steroids at this time (consider adding LAMA at discharge) - repeat CXR 8/2 with mild interstitial edema - Continue nightly bipap - he needs an outpatient sleep study as well as pulmonary function tests - He's currently on 5 L [place], discussed with nursing to wean to his home 3 L [place] - this will need to be done prior to discharge (pt required 6 L to work with PT today) - if he remains on 5 L tomorrow AM, follow up repeat CXR  # Acute metabolic encephalopathy - likely 2/2 hypercarbia, has improved, he's much better today - follow B12 (wnl), TSH (wnl), ammonia (wnl) - continue above treatment for hypercarbia - head CT without acute abnormality  # Elevated Troponin: initial troponin of 144 -> 225.  Pt is asymptomatic without chest pain.  EKG without acute findings.  I suspect this is demand ischemia due to his HF exacerbation. - appreciate cardiology recommendations - restart crestor  # aki on ckd 3 - creatinine peaked at 2.77, up from baseline of ~2.2-2.3 - likely 2/2 diuresis - avoid nephrotoxins. Has been outpt nephrology referred - improved to 2.48 today, will resume home torsemide  # elevated alk phos - f/u ggt -> slightly elevated  - follow outpatient   # macrocytosis - recent b12, folate, tsh wnl - f/u peripheral smear -> pending, ldh (wnl)  # urine nitrites - no reported hx of symptoms - f/u culture (growing providencia), will continue ceftriaxone for now.  Plan to d/c on cefdinir.  # chronic pain - resume home  pain meds, will need caution due to hypercarbia above  # Sacral decubitus ulcer - appeared to be stage 1 on my exam today (no significant wound to photograph), will continue local care, frequent turning, foam - appreciate wound care recs  # Paroxysmal [place]-fib # diastolic chf - hx cardioversion.  - continue apixaban, amiodarone, coreg, imdur - torsemide as above  # T2dm - here glucose elvated to 190. Recent a1c 8.8% - decrease  daily lantus from 46 to 40, start ssi   DVT prophylaxis: eliquis Code Status: full  Family Communication: called wife 8/2 Disposition Plan: pending further improvement  Consultants:   cardiology  Procedures:  none  Antimicrobials:  Anti-infectives (From admission, onward)   Start     Dose/Rate Route Frequency Ordered Stop   03/25/19 0000  cefdinir (OMNICEF) 300 MG capsule     300 mg Oral 2 times daily 03/25/19 1357 03/29/19 2359   03/22/19 2000  cefTRIAXone (ROCEPHIN) 1 g in sodium chloride 0.9 % 100 mL IVPB     1 g 200 mL/hr over 30 Minutes Intravenous Every 24 hours 03/22/19 1902          Subjective: Feels like he just played 18 holes of golf When asked what that means, he mentioned he feels good, but can't walk He's hoping for d/c soon Not on home O2 yet, not to baseline  Objective: Vitals:   03/24/19 2240 03/24/19 2358 03/25/19 0704 03/25/19 1447  BP: (!) 168/76 (!) 161/85 140/69 135/87  Pulse: 66 64 77 83  Resp: _0 Temp:  98.1 F (36.7 C) 98 F (36.7 C) 98.4 F (36.9 C)  TempSrc:  Oral Oral Oral  SpO2: 95% 98% 97% 93%  Weight:      Height:        Intake/Output Summary (Last 24 hours) at 03/25/2019 1719 Last data filed at 03/25/2019 1500 Gross per 24 hour  Intake 819.06 ml  Output 1750 ml  Net -930.94 ml   Filed Weights   03/22/19 0411 03/23/19 0440 03/24/19 0500  Weight: (!) 169.4 kg (!) 171.7 kg (!) 168.9 kg    Examination:  General: No acute distress. Cardiovascular: Heart sounds show Srinidhi Landers regular rate, and rhythm.  Lungs: Clear to auscultation bilaterally  Abdomen: Soft, nontender, nondistended  Neurological: Alert and oriented 3. Moves all extremities 4 . Cranial nerves II through XII grossly intact. Skin: Warm and dry. No rashes or lesions. Extremities: No clubbing or cyanosis. Bilateral LE edema.   Data Reviewed: I have personally reviewed following labs and imaging studies  CBC: Recent Labs  Lab 03/19/19 1032 03/21/19 2128  03/22/19 0730 03/23/19 0803 03/24/19 0219 03/25/19 0344  WBC 6.2 7.5 8.3 8.6 11.8* 10.9*  NEUTROABS 4.0 5.0  --   --   --   --   HGB 12.7* 11.5* 11.3* 11.5* 11.4* 11.7*  HCT 41.4 42.2 39.9 39.9 41.3 42.7  MCV 98.9 107.4* 103.9* 105.8* 105.1* 107.3*  PLT 214.0 205 178 215 253 972   Basic Metabolic Panel: Recent Labs  Lab 03/21/19 2128 03/22/19 0730 03/23/19 0803 03/24/19 0219 03/25/19 0344  NA 143 144 144 141 141  K 4.2 3.8 4.0 3.6 4.4  CL 98 98 98 95* 101  CO2 36* 32 35* 34* 32  GLUCOSE 190* 160* 157* 169* 150*  BUN 25* 25* 34* 39* 38*  CREATININE 2.34* 2.37* 2.53* 2.77* 2.48*  CALCIUM 8.9 8.8* 9.0 8.6* 8.8*  MG  --  2.1 2.4 2.4 2.5*  GFR: Estimated Creatinine Clearance: 51.8 mL/min (Maren Wiesen) (by C-G formula based on SCr of 2.48 mg/dL (H)). Liver Function Tests: Recent Labs  Lab 03/21/19 2128 03/22/19 0730 03/23/19 0803 03/24/19 0219 03/25/19 0344  AST _0 ALT _1 ALKPHOS 259* 250* 257* 255* 262*  BILITOT 0.5 0.5 0.6 0.4 0.3  PROT 7.9 7.1 7.6 7.8 7.5  ALBUMIN 3.2* 3.0* 3.2* 3.1* 3.1*   No results for input(s): LIPASE, AMYLASE in the last 168 hours. Recent Labs  Lab 03/22/19 1927  AMMONIA 30   Coagulation Profile: Recent Labs  Lab 03/21/19 2128  INR 1.1   Cardiac Enzymes: No results for input(s): CKTOTAL, CKMB, CKMBINDEX, TROPONINI in the last 168 hours. BNP (last 3 results) No results for input(s): PROBNP in the last 8760 hours. HbA1C: No results for input(s): HGBA1C in the last 72 hours. CBG: Recent Labs  Lab 03/24/19 1135 03/24/19 1624 03/24/19 2116 03/25/19 0741 03/25/19 1126  GLUCAP 222* 220* 157* 171* 237*   Lipid Profile: No results for input(s): CHOL, HDL, LDLCALC, TRIG, CHOLHDL, LDLDIRECT in the last 72 hours. Thyroid Function Tests: Recent Labs    03/22/19 1927  TSH 0.570   Anemia Panel: Recent Labs    03/22/19 1927  VITAMINB12 562   Sepsis Labs: No results for input(s): PROCALCITON, LATICACIDVEN in  the last 168 hours.  Recent Results (from the past 240 hour(s))  Urine culture     Status: Abnormal   Collection Time: 03/21/19  9:29 PM   Specimen: Urine, Random  Result Value Ref Range Status   Specimen Description   Final    URINE, RANDOM Performed at [facility] [address] [place], [place] [address]    Special Requests   Final    NONE Performed at [facility], [place] [address], [place],  [address]    Culture >=100,000 COLONIES/mL PROVIDENCIA RETTGERI (Johnatan Baskette)  Final   Report Status 03/24/2019 FINAL  Final   Organism ID, Bacteria PROVIDENCIA RETTGERI (Kalista Laguardia)  Final      Susceptibility   Providencia rettgeri - MIC*    AMPICILLIN >=32 RESISTANT Resistant     CEFAZOLIN >=64 RESISTANT Resistant     CEFTRIAXONE <=1 SENSITIVE Sensitive     CIPROFLOXACIN <=0.25 SENSITIVE Sensitive     GENTAMICIN <=1 SENSITIVE Sensitive     IMIPENEM 1 SENSITIVE Sensitive     NITROFURANTOIN 256 RESISTANT Resistant     TRIMETH/SULFA >=320 RESISTANT Resistant     AMPICILLIN/SULBACTAM 16 INTERMEDIATE Intermediate     PIP/TAZO <=4 SENSITIVE Sensitive     * >=100,000 COLONIES/mL PROVIDENCIA RETTGERI  SARS Coronavirus 2 [facility] order, Performed in [facility] hospital lab)     Status: None   Collection Time: 03/21/19  9:30 PM  Result Value Ref Range Status   SARS Coronavirus 2 NEGATIVE NEGATIVE Final    Comment: (NOTE) If result is NEGATIVE SARS-CoV-2 target nucleic acids are NOT DETECTED. The SARS-CoV-2 RNA is generally detectable in upper and lower  respiratory specimens during the acute phase of infection. The lowest  concentration of SARS-CoV-2 viral copies this assay can detect is 250  copies / mL. Sanyla Summey negative result does not preclude SARS-CoV-2 infection  and should not be used as the sole basis for treatment or other  patient management decisions.  Billee Balcerzak negative result may occur with  improper specimen collection / handling, submission of specimen other    than nasopharyngeal swab, presence of viral mutation(s) within the  areas targeted by this assay, and inadequate number of viral copies  (<250 copies / mL). Lawarence Meek negative result must be combined with clinical  observations, patient history, and epidemiological information. If result is POSITIVE SARS-CoV-2 target nucleic acids are DETECTED. The SARS-CoV-2 RNA is generally detectable in upper and lower  respiratory specimens dur ing the acute phase of infection.  Positive  results are indicative of active infection with SARS-CoV-2.  Clinical  correlation with patient history and other diagnostic information is  necessary to determine patient infection status.  Positive results do  not rule out bacterial infection or co-infection with other viruses. If result is PRESUMPTIVE POSTIVE SARS-CoV-2 nucleic acids MAY BE PRESENT.   Latriece Anstine presumptive positive result was obtained on the submitted specimen  and confirmed on repeat testing.  While 2019 novel coronavirus  (SARS-CoV-2) nucleic acids may be present in the submitted sample  additional confirmatory testing may be necessary for epidemiological  and / or clinical management purposes  to differentiate between  SARS-CoV-2 and other Sarbecovirus currently known to infect humans.  If clinically indicated additional testing with an alternate test  methodology [phone]) is advised. The SARS-CoV-2 RNA is generally  detectable in upper and lower respiratory sp ecimens during the acute  phase of infection. The expected result is Negative. Fact Sheet for Patients:  StrictlyIdeas.no Fact Sheet for Healthcare Providers: BankingDealers.co.za This test is not yet approved or cleared by the [place] FDA and has been authorized for detection and/or diagnosis of SARS-CoV-2 by FDA under an Emergency Use Authorization (EUA).  This EUA will remain in effect (meaning this test can be used) for the duration of  the COVID-19 declaration under Section 564(b)(1) of the Act, 21 U.S.C. section 360bbb-3(b)(1), unless the authorization is terminated or revoked sooner. Performed at [facility], [place] [address], [place], [place] [address]   MRSA PCR Screening     Status: None   Collection Time: 03/22/19  4:11 AM   Specimen: Nasopharyngeal  Result Value Ref Range Status   MRSA by PCR NEGATIVE NEGATIVE Final    Comment:        The GeneXpert MRSA Assay (FDA approved for NASAL specimens only), is one component of Mariela Rex comprehensive MRSA colonization surveillance program. It is not intended to diagnose MRSA infection nor to guide or monitor treatment for MRSA infections. Performed at [facility], [place] [address], [place], [place] [address]          Radiology Studies: No results found.      Scheduled Meds:  amiodarone  100 mg Oral Daily   apixaban  5 mg Oral BID   carvedilol  12.5 mg Oral BID WC   chlorhexidine  15 mL Mouth Rinse BID   Chlorhexidine Gluconate Cloth  6 each Topical Daily   insulin aspart  0-20 Units Subcutaneous TID WC   insulin aspart  0-5 Units Subcutaneous QHS   insulin glargine  40 Units Subcutaneous QHS   isosorbide mononitrate  30 mg Oral Daily   mouth rinse  15 mL Mouth Rinse q12n4p   polyvinyl alcohol  1 drop Right Eye QID   potassium chloride SA  40 mEq Oral BID   rosuvastatin  20 mg Oral q1800   sodium chloride flush  3 mL Intravenous Q12H   topiramate  100 mg Oral QHS   torsemide  60 mg Oral BID   Continuous Infusions:  sodium chloride     cefTRIAXone (ROCEPHIN)  IV Stopped (03/24/19 2040)  LOS: 3 days    Time spent: over 30 min    Fayrene Helper, MD [facility] Pager AMION  If 7PM-7AM, please contact night-coverage [email or web address] Password [email or web address] 03/25/2019, 5:19 PM

## 2019-03-25 NOTE — Care Management Important Message (Signed)
Important Message  Patient Details IM Letter given to Nancy Marus RN to present to the Patient Name: [name]. MRN: [id number] Date of Birth: 06-06-1961   Medicare Important Message Given:  Yes     Kerin Salen 03/25/2019, 10:06 AM

## 2019-03-25 NOTE — Progress Notes (Signed)
Pt. continues to refuse CPAP/BiPAP.

## 2019-03-25 NOTE — Progress Notes (Addendum)
Progress Note  Patient Name: [name]. Date of Encounter: 03/25/2019  Primary Cardiologist: [name], MD   Subjective   Patient states that he feels back to baseline.  He feels like he is ready to go home.  No chest pain or shortness of breath.  Inpatient Medications    Scheduled Meds: . amiodarone  100 mg Oral Daily  . apixaban  5 mg Oral BID  . carvedilol  12.5 mg Oral BID WC  . chlorhexidine  15 mL Mouth Rinse BID  . Chlorhexidine Gluconate Cloth  6 each Topical Daily  . insulin aspart  0-20 Units Subcutaneous TID WC  . insulin aspart  0-5 Units Subcutaneous QHS  . insulin glargine  40 Units Subcutaneous QHS  . isosorbide mononitrate  30 mg Oral Daily  . mouth rinse  15 mL Mouth Rinse q12n4p  . polyvinyl alcohol  1 drop Right Eye QID  . potassium chloride SA  40 mEq Oral BID  . rosuvastatin  20 mg Oral q1800  . sodium chloride flush  3 mL Intravenous Q12H  . topiramate  100 mg Oral QHS   Continuous Infusions: . sodium chloride    . cefTRIAXone (ROCEPHIN)  IV Stopped (03/24/19 2040)   PRN Meds: sodium chloride, artificial tears, HYDROcodone-acetaminophen, ipratropium-albuterol, nystatin, polyvinyl alcohol, sodium chloride flush, tiZANidine   Vital Signs    Vitals:   03/24/19 2100 03/24/19 2240 03/24/19 2358 03/25/19 0704  BP: 138/71 (!) 168/76 (!) 161/85 140/69  Pulse: 61 66 64 77  Resp: 20 18 20 20   Temp:   98.1 F (36.7 C) 98 F (36.7 C)  TempSrc:   Oral Oral  SpO2: 95% 95% 98% 97%  Weight:      Height:        Intake/Output Summary (Last 24 hours) at 03/25/2019 0938 Last data filed at 03/25/2019 4742 Gross per 24 hour  Intake 339.06 ml  Output 1350 ml  Net -1010.94 ml   Last 3 Weights 03/24/2019 03/23/2019 03/22/2019  Weight (lbs) 372 lb 5.7 oz 378 lb 8.5 oz 373 lb 7.4 oz  Weight (kg) 168.9 kg 171.7 kg 169.4 kg      Telemetry    Sinus rhythm in the 70s- Personally Reviewed  ECG    No new tracings- Personally Reviewed  Physical  Exam   GEN:  Obese male.  No acute distress.   Neck: No JVD Cardiac: RRR, no murmurs, rubs, or gallops.  Respiratory: Clear to auscultation bilaterally. GI: Soft, nontender, non-distended  MS:  Trace pedal edema; Neuro:  Nonfocal  Psych: Normal affect   Labs    High Sensitivity Troponin:   Recent Labs  Lab 03/22/19 0723 03/22/19 1017  TROPONINIHS 144* 225*      Cardiac EnzymesNo results for input(s): TROPONINI in the last 168 hours. No results for input(s): TROPIPOC in the last 168 hours.   Chemistry Recent Labs  Lab 03/23/19 0803 03/24/19 0219 03/25/19 0344  NA 144 141 141  K 4.0 3.6 4.4  CL 98 95* 101  CO2 35* 34* 32  GLUCOSE 157* 169* 150*  BUN 34* 39* 38*  CREATININE 2.53* 2.77* 2.48*  CALCIUM 9.0 8.6* 8.8*  PROT 7.6 7.8 7.5  ALBUMIN 3.2* 3.1* 3.1*  AST 21 19 21   ALT 23 23 25   ALKPHOS 257* 255* 262*  BILITOT 0.6 0.4 0.3  GFRNONAA 27* 24* 28*  GFRAA 31* 28* 32*  ANIONGAP 11 12 8      Hematology Recent Labs  Lab  03/23/19 0803 03/24/19 0219 03/25/19 0344  WBC 8.6 11.8* 10.9*  RBC 3.77* 3.93* 3.98*  HGB 11.5* 11.4* 11.7*  HCT 39.9 41.3 42.7  MCV 105.8* 105.1* 107.3*  MCH 30.5 29.0 29.4  MCHC 28.8* 27.6* 27.4*  RDW 13.4 13.2 13.2  PLT 215 253 211    BNP Recent Labs  Lab 03/21/19 2130  BNP 282.0*     DDimer No results for input(s): DDIMER in the last 168 hours.   Radiology    Dg Chest 2 View  Result Date: 03/23/2019 CLINICAL DATA:  Shortness of breath and cough EXAM: CHEST - 2 VIEW COMPARISON:  Chest radiograph 03/21/2019 FINDINGS: Monitoring leads overlie the patient. Stable cardiomegaly status post median sternotomy. Pulmonary vascular redistribution and mild interstitial opacities bilaterally. No pleural effusion or pneumothorax. Thoracic spine degenerative changes. IMPRESSION: Cardiomegaly and interstitial opacities suggestive of mild interstitial edema. Electronically Signed   By: Lovey Newcomer M.D.   On: 03/23/2019 11:51    Cardiac  Studies   Echocardiogram 02/22/2019 IMPRESSIONS  1. The left ventricle has normal systolic function, with an ejection fraction of 55-60%. The cavity size was mildly dilated. There is mildly increased left ventricular wall thickness. 2. Left atrial size was mildly dilated. 3. Extremely limited; definity used; normal LV systolic function; focal wall motion abnormality cannot be excluded; mild LVH; mild LVE.  Patient Profile     58 y.o. male with a hx of CAD s/p CABG 0350, chronic diastolic HF, afib on amiodarone and Eliquis, CKDwho was admitted with altered mental status and shortness of breath.  Several days of worsening shortness of breath, lower extremity edema, somnolence and confusion.  Chest x-ray with pulmonary edema.  Assessment & Plan    Acute on chronic diastolic heart failure -Patient was on torsemide 60 mg twice daily at home -He has been diuresed here with IV Lasix, which was stopped due to worsened renal function. -This patient is known to Dr. Percival Spanish who felt that yesterday patient appeared actually improved from the last time he saw him. -Patient appears to be nearly euvolemic, at his baseline. -Likely discharge soon on home torsemide dosing.  CAD s/p CABG 2009 -Troponin mildly elevated likely related to CHF and CKD.  EKG without acute ischemic changes and no specific chest pain. -Had been apparently off Crestor which is now resumed. -On Imdur 30 mg, statin, beta-blocker.  Not on aspirin due to need for anticoagulation. -Currently denies any chest pain/pressure  CKD -Serum creatinine increased with diuresis, 2.34 >>2.77.  Diuretics now on hold. -Serum creatinine improved this morning at 2.48.  Atrial fibrillation -History of DCCV 04/2018 -Has been on amiodarone 100 mg daily and Coreg 12.5 mg twice daily.  Eliquis for stroke risk reduction. -Maintaining sinus rhythm.  Hypertension -Blood pressure has been elevated, better this morning, 140/69. -No ARB/ACEi  due to renal function. Could consider adding amlodipine if needed.       For questions or updates, please contact [place] Please consult [email or web address] for contact info under        Signed, Daune Perch, NP  03/25/2019, 9:38 AM    History and all data above reviewed.  Patient examined.  I agree with the findings as above.  He is breathing at baseline.  No pain.    The patient exam reveals COR:RRR,  Lungs: Clear  ,  Abd: Positive bowel sounds, no rebound no guarding, Ext No edema  .  All available labs, radiology testing, previous records reviewed. Agree with documented assessment and plan.  Agree with resuming Torsemide before discharge and he will need to have his PCP follow his creat closely.  Of note his is on 3 liters of O2 at home and 5 currently here so we will need to see how he dose on 3 liters.    Jeneen Rinks Rebekka Lobello  3:03 PM  03/25/2019

## 2019-03-26 ENCOUNTER — Ambulatory Visit: Payer: Self-pay

## 2019-03-26 LAB — GLUCOSE, CAPILLARY
Glucose-Capillary: 153 mg/dL — ABNORMAL HIGH (ref 70–99)
Glucose-Capillary: 275 mg/dL — ABNORMAL HIGH (ref 70–99)
Glucose-Capillary: 186 mg/dL — ABNORMAL HIGH (ref 70–99)
Glucose-Capillary: 165 mg/dL — ABNORMAL HIGH (ref 70–99)

## 2019-03-26 LAB — CBC
HCT: 39.4 % (ref 39.0–52.0)
Hemoglobin: 11.1 g/dL — ABNORMAL LOW (ref 13.0–17.0)
MCH: 29.7 pg (ref 26.0–34.0)
MCHC: 28.2 g/dL — ABNORMAL LOW (ref 30.0–36.0)
MCV: 105.3 fL — ABNORMAL HIGH (ref 80.0–100.0)
Platelets: 194 10*3/uL (ref 150–400)
RBC: 3.74 MIL/uL — ABNORMAL LOW (ref 4.22–5.81)
RDW: 13.2 % (ref 11.5–15.5)
WBC: 9.3 10*3/uL (ref 4.0–10.5)
nRBC: 0 % (ref 0.0–0.2)

## 2019-03-26 LAB — COMPREHENSIVE METABOLIC PANEL
ALT: 24 U/L (ref 0–44)
AST: 19 U/L (ref 15–41)
Albumin: 3.1 g/dL — ABNORMAL LOW (ref 3.5–5.0)
Alkaline Phosphatase: 256 U/L — ABNORMAL HIGH (ref 38–126)
Anion gap: 10 (ref 5–15)
BUN: 42 mg/dL — ABNORMAL HIGH (ref 6–20)
CO2: 31 mmol/L (ref 22–32)
Calcium: 8.5 mg/dL — ABNORMAL LOW (ref 8.9–10.3)
Chloride: 99 mmol/L (ref 98–111)
Creatinine, Ser: 2.65 mg/dL — ABNORMAL HIGH (ref 0.61–1.24)
GFR calc Af Amer: 29 mL/min — ABNORMAL LOW (ref >60)
GFR calc non Af Amer: 25 mL/min — ABNORMAL LOW (ref >60)
Glucose, Bld: 155 mg/dL — ABNORMAL HIGH (ref 70–99)
Potassium: 4 mmol/L (ref 3.5–5.1)
Sodium: 140 mmol/L (ref 135–145)
Total Bilirubin: 0.4 mg/dL (ref 0.3–1.2)
Total Protein: 7.3 g/dL (ref 6.5–8.1)

## 2019-03-26 LAB — MAGNESIUM: Magnesium: 2.7 mg/dL — ABNORMAL HIGH (ref 1.7–2.4)

## 2019-03-26 MED ORDER — LEVOFLOXACIN 500 MG PO TABS: 500.0000 mg | ORAL_TABLET | Freq: Every day | ORAL | 0 refills | Status: DC

## 2019-03-26 MED ORDER — LEVOFLOXACIN 250 MG PO TABS: 250.0000 mg | ORAL_TABLET | Freq: Every day | ORAL | 0 refills | Status: DC

## 2019-03-26 MED ORDER — LEVOFLOXACIN 500 MG PO TABS: 250.0000 mg | ORAL_TABLET | Freq: Every day | ORAL | Status: DC

## 2019-03-26 MED ORDER — LEVOFLOXACIN 750 MG PO TABS
750.0000 mg | ORAL_TABLET | ORAL | Status: DC
  Administered 2019-03-26: 750 mg via ORAL
  Filled 2019-03-26: qty 1

## 2019-03-26 MED ORDER — CIPROFLOXACIN HCL 250 MG PO TABS: 250.0000 mg | ORAL_TABLET | Freq: Two times a day (BID) | ORAL | Status: DC

## 2019-03-26 MED ORDER — LEVOFLOXACIN 500 MG PO TABS: 500.0000 mg | ORAL_TABLET | Freq: Once | ORAL | Status: DC

## 2019-03-26 NOTE — Progress Notes (Signed)
Pharmacy Antibiotic Note  [name]. is a 58 y.o. male admitted on 03/21/2019 with UTI  Pharmacy has been consulted for levaquin dosing.  Plan: levaquin 750mg  po q48h Follow renal function  Height: 5\' 11"  (180.3 cm) Weight: (!) 372 lb 5.7 oz (168.9 kg) IBW/kg (Calculated) : 75.3  Temp (24hrs), Avg:98.1 F (36.7 C), Min:98 F (36.7 C), Max:98.4 F (36.9 C)  Recent Labs  Lab 03/22/19 0730 03/23/19 0803 03/24/19 0219 03/25/19 0344 03/26/19 0355  WBC 8.3 8.6 11.8* 10.9* 9.3  CREATININE 2.37* 2.53* 2.77* 2.48* 2.65*    Estimated Creatinine Clearance: 48.4 mL/min (A) (by C-G formula based on SCr of 2.65 mg/dL (H)).    Allergies  Allergen Reactions  . Morphine And Related Anaphylaxis    "makes me stop breathing"  . Novocain [Procaine] Anaphylaxis  . Carbamazepine     shaking  . Codeine Other (See Comments)    "makes heart race" per patient  . Gabapentin Other (See Comments)    Blood in urine  . Ivp Dye [Iodinated Diagnostic Agents]     Passing out  . Other     Steroids makes hearts race  . Prednisone     Heart racing     Thank you for allowing pharmacy to be a part of this patient's care.  Dolly Rias RPh 03/26/2019, 5:03 PM Pager [phone]

## 2019-03-26 NOTE — Progress Notes (Addendum)
Progress Note  Patient Name: [name]. Date of Encounter: 03/26/2019  Primary Cardiologist: [name], MD   Subjective   Doing ok. No complaints. Breathing has returned to baseline No CP. He is eager to go home.   Inpatient Medications    Scheduled Meds: . amiodarone  100 mg Oral Daily  . apixaban  5 mg Oral BID  . carvedilol  12.5 mg Oral BID WC  . chlorhexidine  15 mL Mouth Rinse BID  . Chlorhexidine Gluconate Cloth  6 each Topical Daily  . insulin aspart  0-20 Units Subcutaneous TID WC  . insulin aspart  0-5 Units Subcutaneous QHS  . insulin glargine  40 Units Subcutaneous QHS  . isosorbide mononitrate  30 mg Oral Daily  . mouth rinse  15 mL Mouth Rinse q12n4p  . polyvinyl alcohol  1 drop Right Eye QID  . potassium chloride SA  40 mEq Oral BID  . rosuvastatin  20 mg Oral q1800  . sodium chloride flush  3 mL Intravenous Q12H  . topiramate  100 mg Oral QHS  . torsemide  60 mg Oral BID   Continuous Infusions: . sodium chloride    . cefTRIAXone (ROCEPHIN)  IV 1 g (03/25/19 2041)   PRN Meds: sodium chloride, artificial tears, HYDROcodone-acetaminophen, ipratropium-albuterol, nystatin, polyvinyl alcohol, sodium chloride flush, tiZANidine   Vital Signs    Vitals:   03/25/19 1700 03/25/19 1800 03/25/19 2024 03/26/19 0429  BP:   (!) 108/58 (!) 144/67  Pulse:   75 69  Resp:   20 18  Temp:   98 F (36.7 C) 98 F (36.7 C)  TempSrc:   Oral   SpO2: 91% 93% 97% 95%  Weight:      Height:        Intake/Output Summary (Last 24 hours) at 03/26/2019 1116 Last data filed at 03/26/2019 0618 Gross per 24 hour  Intake 580 ml  Output 900 ml  Net -320 ml   Last 3 Weights 03/24/2019 03/23/2019 03/22/2019  Weight (lbs) 372 lb 5.7 oz 378 lb 8.5 oz 373 lb 7.4 oz  Weight (kg) 168.9 kg 171.7 kg 169.4 kg      Telemetry    N/a - Personally Reviewed  ECG    12 lead ordered ? Atrial flutter. EKG yesterday w/ appearance of flutter waves in several leads but ? waves  in lateral leads. - Personally Reviewed  Physical Exam   GEN: morbidly obese, disheveled appearing WM in no acute distress. Facial asymmetry (twisted mouth- chronic).   Neck: No JVD Cardiac: RRR, no murmurs, rubs, or gallops.  Respiratory: Clear to auscultation bilaterally. GI: obese abdomen, soft, nontender, non-distended  MS: No pitting edema, chronic venous statis dermatitis bilateral LEs  Neuro:  Nonfocal  Psych: Normal affect   Labs    High Sensitivity Troponin:   Recent Labs  Lab 03/22/19 0723 03/22/19 1017  TROPONINIHS 144* 225*      Cardiac EnzymesNo results for input(s): TROPONINI in the last 168 hours. No results for input(s): TROPIPOC in the last 168 hours.   Chemistry Recent Labs  Lab 03/24/19 0219 03/25/19 0344 03/26/19 0355  NA 141 141 140  K 3.6 4.4 4.0  CL 95* 101 99  CO2 34* 32 31  GLUCOSE 169* 150* 155*  BUN 39* 38* 42*  CREATININE 2.77* 2.48* 2.65*  CALCIUM 8.6* 8.8* 8.5*  PROT 7.8 7.5 7.3  ALBUMIN 3.1* 3.1* 3.1*  AST 19 21 19   ALT 23 25 24  ALKPHOS 255* 262* 256*  BILITOT 0.4 0.3 0.4  GFRNONAA 24* 28* 25*  GFRAA 28* 32* 29*  ANIONGAP 12 8 10      Hematology Recent Labs  Lab 03/24/19 0219 03/25/19 0344 03/26/19 0355  WBC 11.8* 10.9* 9.3  RBC 3.93* 3.98* 3.74*  HGB 11.4* 11.7* 11.1*  HCT 41.3 42.7 39.4  MCV 105.1* 107.3* 105.3*  MCH 29.0 29.4 29.7  MCHC 27.6* 27.4* 28.2*  RDW 13.2 13.2 13.2  PLT 253 211 194    BNP Recent Labs  Lab 03/21/19 2130  BNP 282.0*     DDimer No results for input(s): DDIMER in the last 168 hours.   Radiology    No results found.  Cardiac Studies   Echocardiogram 02/22/2019 IMPRESSIONS  1. The left ventricle has normal systolic function, with an ejection fraction of 55-60%. The cavity size was mildly dilated. There is mildly increased left ventricular wall thickness. 2. Left atrial size was mildly dilated. 3. Extremely limited; definity used; normal LV systolic function; focal wall motion  abnormality cannot be excluded; mild LVH; mild LVE.  Patient Profile     58 y.o. male with a hx of CADs/p CABG 2774, chronic diastolic HF, afibon amiodarone and Eliquis,CKDwhowas admitted with altered mental status and shortness of breath. Several days of worsening shortness of breath, lower extremity edema, somnolence and confusion. Chest x-ray with pulmonary edema.  Assessment & Plan    1. Acute on Chronic Diastolic HF: Echo prior to this admit (7/4) showed normal LVEF, 55-60%. Admit BNP 7/31 was 282. CXR showed cardiomegaly, pulmonary vascular congestion and pulmonary venous hypertension. Treated w/ IV diuretics. Good response. Volume status improved. Net I/Os -5.6 L since admit. Obese but no peripheral edema on exam. Breathing back to baseline. He has been transitioned from IV Lasix to PO Torsemide. SCr is 2.65 today, which seems to be his new baseline. His SCr since the first of July has fluctuated, ranging from 2.4-2.8. D/c home on 60 mg of torsemide 60 mg BID. Recommend f/u BMP at post hospital f/u in 7-10 days.   2. CAD: s/p CABG in 2009. Denies CP. Continue medical therapy. No ASA due to Eliquis. Continue  blocker, statin and LA nitrate.   3. Atrial Fibrillation: s/p DCCV 04/2018. Admit EKG 7/31 showed NSR but repeat EKG 8/4 shows flutter waves in several leads but ? P waves in lateral leads. He is currently off of tele. Will order another 12 lead EKG. For now, continue amiodarone, Coreg and Eliquis. Despite SCr > 1.5, ok to continue full dose Eliquis 5 mg BID given age < 25 and Wt > 60 kg.  If atrial flutter is confirmed on EKG, consider reloading w/ amiodarone and repeat outpatient DCCV and [facility] Fibrillation Clinic f/u.    4. HTN: ok but mildly elevated this morning 144/67. Can follow as outpatient and can further titrate meds as needed.   5. CKD:  SCr is 2.65 today which seems to be his new baseline. His SCr since the first of July has fluctuated, ranging from 2.4-2.8. He will be  discharged on a diuretic for chronic diastolic HF. Recommend f/u BMP at post hospital f/u in 7-10 days.   6. Elevated Hs Troponin: 144>>>225. No CP. Not c/w ACS. Suspect elevated troponin likely demand ischemia in the setting of acute dCHF and CKD.   Dispo: stable from a cardiac standpoint. Suspect he can be d/c home today. MD to follow with final recommendations.   For questions or updates, please contact [facility] Please consult [email or web address] for contact info under      Signed, Lyda Jester, PA-C  03/26/2019, 11:16 AM    History and all data above reviewed.  Patient examined.  I agree with the findings as above.  He says that he is breathing at baseline.  No pain.    The patient exam reveals COR:RRR  ,  Lungs: Clear  ,  Abd: Positive bowel sounds, no rebound no guarding, Ext Mild edema  .  All available labs, radiology testing, previous records reviewed. Agree with documented assessment and plan. OK to discharge.  We will arrange follow up with Dr. Aundra Dubin.  He should have follow up labs with his PCP before this.   Jeneen Rinks Nelva Hauk  2:31 PM  03/26/2019

## 2019-03-26 NOTE — Discharge Instructions (Signed)
Heart Failure, Self Care °Heart failure is a serious condition. This sheet explains things you need to do to take care of yourself at home. To help you stay as healthy as possible, you may be asked to change your diet, take certain medicines, and make other changes in your life. Your doctor may also give you more specific instructions. If you have problems or questions, call your doctor. °What are the risks? °Having heart failure makes it more likely for you to have some problems. These problems can get worse if you do not take good care of yourself. Problems may include: °· Blood clotting problems. This may cause a stroke. °· Damage to the kidneys, liver, or lungs. °· Abnormal heart rhythms. °Supplies needed: °· Scale for weighing yourself. °· Blood pressure monitor. °· Notebook. °· Medicines. °How to care for yourself when you have heart failure °Medicines °Take over-the-counter and prescription medicines only as told by your doctor. Take your medicines every day. °· Do not stop taking your medicine unless your doctor tells you to do so. °· Do not skip any medicines. °· Get your prescriptions refilled before you run out of medicine. This is important. °Eating and drinking ° °· Eat heart-healthy foods. Talk with a diet specialist (dietitian) to create an eating plan. °· Choose foods that: °? Have no trans fat. °? Are low in saturated fat and cholesterol. °· Choose healthy foods, such as: °? Fresh or frozen fruits and vegetables. °? Fish. °? Low-fat (lean) meats. °? Legumes, such as beans, peas, and lentils. °? Fat-free or low-fat dairy products. °? Whole-grain foods. °? High-fiber foods. °· Limit salt (sodium) if told by your doctor. Ask your diet specialist to tell you which seasonings are healthy for your heart. °· Cook in healthy ways instead of frying. Healthy ways of cooking include roasting, grilling, broiling, baking, poaching, steaming, and stir-frying. °· Limit how much fluid you drink, if told by your  doctor. °Alcohol use °· Do not drink alcohol if: °? Your doctor tells you not to drink. °? Your heart was damaged by alcohol, or you have very bad heart failure. °? You are pregnant, may be pregnant, or are planning to become pregnant. °· If you drink alcohol: °? Limit how much you use to: °§ 0-1 drink a day for women. °§ 0-2 drinks a day for men. °? Be aware of how much alcohol is in your drink. In the [place] one drink equals one 12 oz bottle of beer (355 mL), one 5 oz glass of wine (148 mL), or one 1½ oz glass of hard liquor (44 mL). °Lifestyle ° °· Do not use any products that contain nicotine or tobacco, such as cigarettes, e-cigarettes, and chewing tobacco. If you need help quitting, ask your doctor. °? Do not use nicotine gum or patches before talking to your doctor. °· Do not use illegal drugs. °· Lose weight if told by your doctor. °· Do physical activity if told by your doctor. Talk to your doctor before you begin an exercise if: °? You are an older adult. °? You have very bad heart failure. °· Learn to manage stress. If you need help, ask your doctor. °· Get rehab (rehabilitation) to help you stay independent and to help with your quality of life. °· Plan time to rest when you get tired. °Check weight and blood pressure ° °· Weigh yourself every day. This will help you to know if fluid is building up in your body. °? Weigh yourself every morning   after you pee (urinate) and before you eat breakfast. ? Wear the same amount of clothing each time. ? Write down your daily weight. Give your record to your doctor.  Check and write down your blood pressure as told by your doctor.  Check your pulse as told by your doctor. Dealing with very hot and very cold weather  If it is very hot: ? Avoid activities that take a lot of energy. ? Use air conditioning or fans, or find a cooler place. ? Avoid caffeine and alcohol. ? Wear clothing that is loose-fitting, lightweight, and light-colored.  If it is very  cold: ? Avoid activities that take a lot of energy. ? Layer your clothes. ? Wear mittens or gloves, a hat, and a scarf when you go outside. ? Avoid alcohol. Follow these instructions at home:  Stay up to date with shots (vaccines). Get pneumococcal and flu (influenza) shots.  Keep all follow-up visits as told by your doctor. This is important. Contact a doctor if:  You gain weight quickly.  You have increasing shortness of breath.  You cannot do your normal activities.  You get tired easily.  You cough a lot.  You don't feel like eating or feel like you may vomit (nauseous).  You become puffy (swell) in your hands, feet, ankles, or belly (abdomen).  You cannot sleep well because it is hard to breathe.  You feel like your heart is beating fast (palpitations).  You get dizzy when you stand up. Get help right away if:  You have trouble breathing.  You or someone else notices a change in your behavior, such as having trouble staying awake.  You have chest pain or discomfort.  You pass out (faint). These symptoms may be an emergency. Do not wait to see if the symptoms will go away. Get medical help right away. Call your local emergency services (911 in the [place] Do not drive yourself to the hospital. Summary  Heart failure is a serious condition. To care for yourself, you may have to change your diet, take medicines, and make other lifestyle changes.  Take your medicines every day. Do not stop taking them unless your doctor tells you to do so.  Eat heart-healthy foods, such as fresh or frozen fruits and vegetables, fish, lean meats, legumes, fat-free or low-fat dairy products, and whole-grain or high-fiber foods.  Ask your doctor if you can drink alcohol. You may have to stop alcohol use if you have very bad heart failure.  Contact your doctor if you gain weight quickly or feel that your heart is beating too fast. Get help right away if you pass out, or have chest pain  or trouble breathing. This information is not intended to replace advice given to you by your health care provider. Make sure you discuss any questions you have with your health care provider. Document Released: 11/20/2018 Document Revised: 11/19/2018 Document Reviewed: 11/20/2018 Elsevier Patient Education  2020 [place].   Heart Failure Exacerbation  Heart failure is a condition in which the heart does not fill up with enough blood, and therefore does not pump enough blood and oxygen to the body. When this happens, parts of the body do not get the blood and oxygen they need to function properly. This can cause symptoms such as breathing problems, fatigue, swelling, and confusion. Heart failure exacerbation refers to heart failure symptoms that get worse. The symptoms may get worse suddenly or develop slowly over time. Heart failure exacerbation is a serious  medical problem that should be treated right away. What are the causes? A heart failure exacerbation can be triggered by:  Not taking your heart failure medicines correctly.  Infections.  Eating an unhealthy diet or a diet that is high in salt (sodium).  Drinking too much fluid.  Drinking alcohol.  Taking illegal drugs, such as cocaine or methamphetamine.  Not exercising. Other causes include:  Other heart conditions such as an irregular heartbeat (arrhythmia).  Anemia.  Other medical problems, such as kidney failure. Sometimes the cause of the exacerbation is not known. What are the signs or symptoms? When heart failure symptoms suddenly or slowly get worse, this may be a sign of heart failure exacerbation. Symptoms of heart failure include:  Breathing problems or shortness of breath.  Chronic coughing or wheezing.  Fatigue.  Nausea or lack of appetite.  Feeling light-headed.  Confusion or memory loss.  Increased heart rate or irregular heartbeat.  Buildup of fluid in the legs, ankles, feet, or  abdomen.  Difficulty breathing when lying down. How is this diagnosed? This condition is diagnosed based on:  Your symptoms and medical history.  A physical exam. You may also have tests, including:  Electrocardiogram (ECG). This test measures the electrical activity of your heart.  Echocardiogram. This test uses sound waves to take a picture of your heart to see how well it works.  Blood tests.  Imaging tests, such as: ? Chest X-ray. ? MRI. ? Ultrasound.  Stress test. This test examines how well your heart functions when you exercise. Your heart is monitored while you exercise on a treadmill or exercise bike. If you cannot exercise, medicines may be used to increase your heartbeat in place of exercise.  Cardiac catheterization. During this test, a thin, flexible tube (catheter) is inserted into a blood vessel and threaded up to your heart. This test allows your health care provider to check the arteries that lead to your heart (coronary arteries).  Right heart catheterization. During this test, the pressure in your heart is measured. How is this treated? This condition may be treated by:  Adjusting your heart medicines.  Maintaining a healthy lifestyle. This includes: ? Eating a heart-healthy diet that is low in sodium. ? Not using any products that contain nicotine or tobacco, such as cigarettes and e-cigarettes. ? Regular exercise. ? Monitoring your fluid intake. ? Monitoring your weight and reporting changes to your health care provider.  Treating sleep apnea, if you have this condition.  Surgery. This may include: ? Implanting a device that helps both sides of your heart contract at the same time (cardiac resynchronization therapy device). This can help with heart function and relieve heart failure symptoms. ? Implanting a device that can correct heart rhythm problems (implantable cardioverter defibrillator). ? Connecting a device to your heart to help it pump blood  (ventricular assist device). ? Heart transplant. Follow these instructions at home: Medicines  Take over-the-counter and prescription medicines only as told by your health care provider.  Do not stop taking your medicines or change the amount you take. If you are having problems or side effects from your medicines, talk to your health care provider.  If you are having difficulty paying for your medicines, contact a social worker or your clinic. There are many programs to assist with medicine costs.  Talk to your health care provider before starting any new medicines or supplements.  Make sure your health care provider and pharmacist have a list of all the medicines you  are taking. Eating and drinking   Avoid drinking alcohol.  Eat a heart-healthy diet as told by your health care provider. This includes: ? Plenty of fruits and vegetables. ? Lean proteins. ? Low-fat dairy. ? Whole grains. ? Foods that are low in sodium. Activity   Exercise regularly as told by your health care provider. Balance exercise with rest.  Ask your health care provider what activities are safe for you. This includes sexual activity, exercise, and daily tasks at home or work. Lifestyle  Do not use any products that contain nicotine or tobacco, such as cigarettes and e-cigarettes. If you need help quitting, ask your health care provider.  Maintain a healthy weight. Ask your health care provider what weight is healthy for you.  Consider joining a patient support group. This can help with emotional problems you may have, such as stress and anxiety. General instructions  Talk to your health care provider about flu and pneumonia vaccines.  Keep a list of medicines that you are taking. This may help in emergency situations.  Keep all follow-up visits as told by your health care provider. This is important. Contact a health care provider if:  You have questions about your medicines or you miss a  dose.  You feel anxious, depressed, or stressed.  You have swelling in your feet, ankles, legs, or abdomen.  You have shortness of breath during activity or exercise.  You have a cough.  You have a fever.  You have trouble sleeping.  You gain 2-3 lb (1-1.4 kg) in 24 hours or 5 lb (2.3 kg) in a week. Get help right away if:  You have chest pain.  You have shortness of breath while resting.  You have severe fatigue.  You are confused.  You have severe dizziness.  You have a rapid or irregular heartbeat.  You have nausea or you vomit.  You have a cough that is worse at night or you cannot lie flat.  You have a cough that will not go away.  You have severe depression or sadness. Summary  When heart failure symptoms get worse, it is called heart failure exacerbation.  Common causes of this condition include taking medicines incorrectly, infections, and drinking alcohol.  This condition may be treated by adjusting medicines, maintaining a healthy lifestyle, or surgery.  Do not stop taking your medicines or change the amount you take. If you are having problems or side effects from your medicines, talk to your health care provider. This information is not intended to replace advice given to you by your health care provider. Make sure you discuss any questions you have with your health care provider. Document Released: 12/19/2016 Document Revised: 2020-06-817 Document Reviewed: 12/19/2016 Elsevier Patient Education  2020 [place].   Heart Failure Eating Plan Heart failure, also called congestive heart failure, occurs when your heart does not pump blood well enough to meet your body's needs for oxygen-rich blood. Heart failure is a long-term (chronic) condition. Living with heart failure can be challenging. However, following your health care provider's instructions about a healthy lifestyle and working with a diet and nutrition specialist (dietitian) to choose the right  foods may help to improve your symptoms. What are tips for following this plan? Reading food labels  Check food labels for the amount of sodium per serving. Choose foods that have less than 140 mg (milligrams) of sodium in each serving.  Check food labels for the number of calories per serving. This is important if you  need to limit your daily calorie intake to lose weight.  Check food labels for the serving size. If you eat more than one serving, you will be eating more sodium and calories than what is listed on the label.  Look for foods that are labeled as "sodium-free," "very low sodium," or "low sodium." ? Foods labeled as "reduced sodium" or "lightly salted" may still have more sodium than what is recommended for you. Cooking  Avoid adding salt when cooking. Ask your health care provider or dietitian before using salt substitutes.  Season food with salt-free seasonings, spices, or herbs. Check the label of seasoning mixes to make sure they do not contain salt.  Cook with heart-healthy oils, such as olive, canola, soybean, or sunflower oil.  Do not fry foods. Cook foods using low-fat methods, such as baking, boiling, grilling, and broiling.  Limit unhealthy fats when cooking by: ? Removing the skin from poultry, such as chicken. ? Removing all visible fats from meats. ? Skimming the fat off from stews, soups, and gravies before serving them. Meal planning   Limit your intake of: ? Processed, canned, or pre-packaged foods. ? Foods that are high in trans fat, such as fried foods. ? Sweets, desserts, sugary drinks, and other foods with added sugar. ? Full-fat dairy products, such as whole milk.  Eat a balanced diet that includes: ? 4-5 servings of fruit each day and 4-5 servings of vegetables each day. At each meal, try to fill half of your plate with fruits and vegetables. ? Up to 6-8 servings of whole grains each day. ? Up to 2 servings of lean meat, poultry, or fish each  day. One serving of meat is equal to 3 oz. This is about the same size as a deck of cards. ? 2 servings of low-fat dairy each day. ? Heart-healthy fats. Healthy fats called omega-3 fatty acids are found in foods such as flaxseed and cold-water fish like sardines, salmon, and mackerel.  Aim to eat 25-35 g (grams) of fiber a day. Foods that are high in fiber include apples, broccoli, carrots, beans, peas, and whole grains.  Do not add salt or condiments that contain salt (such as soy sauce) to foods before eating.  When eating at a restaurant, ask that your food be prepared with less salt or no salt, if possible.  Try to eat 2 or more vegetarian meals each week.  Eat more home-cooked food and eat less restaurant, buffet, and fast food. General information  Do not eat more than 2,300 mg of salt (sodium) a day. The amount of sodium that is recommended for you may be lower, depending on your condition.  Maintain a healthy body weight as directed. Ask your health care provider what a healthy weight is for you. ? Check your weight every day. ? Work with your health care provider and dietitian to make a plan that is right for you to lose weight or maintain your current weight.  Limit how much fluid you drink. Ask your health care provider or dietitian how much fluid you can have each day.  Limit or avoid alcohol as told by your health care provider or dietitian. Recommended foods The items listed may not be a complete list. Talk with your dietitian about what dietary choices are best for you. Fruits All fresh, frozen, and canned fruits. Dried fruits, such as raisins, prunes, and cranberries. Vegetables All fresh vegetables. Vegetables that are frozen without sauce or added salt. Low-sodium or sodium-free canned  vegetables. Grains Bread with less than 80 mg of sodium per slice. Whole-wheat pasta, quinoa, and brown rice. Oats and oatmeal. Barley. [place]. Grits and cream of wheat. Whole-grain and  whole-wheat cold cereal. Meats and other protein foods Lean cuts of meat. Skinless chicken and [place]. Fish with high omega-3 fatty acids, such as salmon, sardines, and other cold-water fishes. Eggs. Dried beans, peas, and edamame. Unsalted nuts and nut butters. Dairy Low-fat or nonfat (skim) milk and dried milk. Rice milk, soy milk, and almond milk. Low-fat or nonfat yogurt. Small amounts of reduced-sodium block cheese. Low-sodium cottage cheese. Fats and oils Olive, canola, soybean, flaxseed, or sunflower oil. Avocado. Sweets and desserts Apple sauce. Granola bars. Sugar-free pudding and gelatin. Frozen fruit bars. Seasoning and other foods Fresh and dried herbs. Lemon or lime juice. Vinegar. Low-sodium ketchup. Salt-free marinades, salad dressings, sauces, and seasonings. The items listed above may not be a complete list of foods and beverages you can eat. Contact a dietitian for more information. Foods to avoid The items listed may not be a complete list. Talk with your dietitian about what dietary choices are best for you. Fruits Fruits that are dried with sodium-containing preservatives. Vegetables Canned vegetables. Frozen vegetables with sauce or seasonings. Creamed vegetables. [place] fries. Onion rings. Pickled vegetables and sauerkraut. Grains Bread with more than 80 mg of sodium per slice. Hot or cold cereal with more than 140 mg sodium per serving. Salted pretzels and crackers. Pre-packaged breadcrumbs. Bagels, croissants, and biscuits. Meats and other protein foods Ribs and chicken wings. Bacon, ham, pepperoni, bologna, salami, and packaged luncheon meats. Hot dogs, bratwurst, and sausage. Canned meat. Smoked meat and fish. Salted nuts and seeds. Dairy Whole milk, half-and-half, and cream. Buttermilk. Processed cheese, cheese spreads, and cheese curds. Regular cottage cheese. Feta cheese. Shredded cheese. String cheese. Fats and oils Butter, lard, shortening, ghee, and bacon  fat. Canned and packaged gravies. Seasoning and other foods Onion salt, garlic salt, table salt, and sea salt. Marinades. Regular salad dressings. Relishes, pickles, and olives. Meat flavorings and tenderizers, and bouillon cubes. Horseradish, ketchup, and mustard. Worcestershire sauce. Teriyaki sauce, soy sauce (including reduced sodium). Hot sauce and Tabasco sauce. Steak sauce, fish sauce, oyster sauce, and cocktail sauce. Taco seasonings. Barbecue sauce. Tartar sauce. The items listed above may not be a complete list of foods and beverages you should avoid. Contact a dietitian for more information. Summary  A heart failure eating plan includes changes that limit your intake of sodium and unhealthy fat, and it may help you lose weight or maintain a healthy weight. Your health care provider may also recommend limiting how much fluid you drink.  Most people with heart failure should eat no more than 2,300 mg of salt (sodium) a day. The amount of sodium that is recommended for you may be lower, depending on your condition.  Contact your health care provider or dietitian before making any major changes to your diet. This information is not intended to replace advice given to you by your health care provider. Make sure you discuss any questions you have with your health care provider. Document Released: 12/22/2016 Document Revised: 10/03/2018 Document Reviewed: 12/22/2016 Elsevier Patient Education  [place].  Urinary Tract Infection, Adult A urinary tract infection (UTI) is an infection of any part of the urinary tract. The urinary tract includes:  The kidneys.  The ureters.  The bladder.  The urethra. These organs make, store, and get rid of pee (urine) in the body. What are the  causes? This is caused by germs (bacteria) in your genital area. These germs grow and cause swelling (inflammation) of your urinary tract. What increases the risk? You are more likely to develop this  condition if:  You have a small, thin tube (catheter) to drain pee.  You cannot control when you pee or poop (incontinence).  You are male, and: ? You use these methods to prevent pregnancy: ? A medicine that kills sperm (spermicide). ? A device that blocks sperm (diaphragm). ? You have low levels of a male hormone (estrogen). ? You are pregnant.  You have genes that add to your risk.  You are sexually active.  You take antibiotic medicines.  You have trouble peeing because of: ? A prostate that is bigger than normal, if you are male. ? A blockage in the part of your body that drains pee from the bladder (urethra). ? A kidney stone. ? A nerve condition that affects your bladder (neurogenic bladder). ? Not getting enough to drink. ? Not peeing often enough.  You have other conditions, such as: ? Diabetes. ? A weak disease-fighting system (immune system). ? Sickle cell disease. ? Gout. ? Injury of the spine. What are the signs or symptoms? Symptoms of this condition include:  Needing to pee right away (urgently).  Peeing often.  Peeing small amounts often.  Pain or burning when peeing.  Blood in the pee.  Pee that smells bad or not like normal.  Trouble peeing.  Pee that is cloudy.  Fluid coming from the vagina, if you are male.  Pain in the belly or lower back. Other symptoms include:  Throwing up (vomiting).  No urge to eat.  Feeling mixed up (confused).  Being tired and grouchy (irritable).  A fever.  Watery poop (diarrhea). How is this treated? This condition may be treated with:  Antibiotic medicine.  Other medicines.  Drinking enough water. Follow these instructions at home:  Medicines  Take over-the-counter and prescription medicines only as told by your doctor.  If you were prescribed an antibiotic medicine, take it as told by your doctor. Do not stop taking it even if you start to feel better. General instructions  Make  sure you: ? Pee until your bladder is empty. ? Do not hold pee for a long time. ? Empty your bladder after sex. ? Wipe from front to back after pooping if you are a male. Use each tissue one time when you wipe.  Drink enough fluid to keep your pee pale yellow.  Keep all follow-up visits as told by your doctor. This is important. Contact a doctor if:  You do not get better after 1-2 days.  Your symptoms go away and then come back. Get help right away if:  You have very bad back pain.  You have very bad pain in your lower belly.  You have a fever.  You are sick to your stomach (nauseous).  You are throwing up. Summary  A urinary tract infection (UTI) is an infection of any part of the urinary tract.  This condition is caused by germs in your genital area.  There are many risk factors for a UTI. These include having a small, thin tube to drain pee and not being able to control when you pee or poop.  Treatment includes antibiotic medicines for germs.  Drink enough fluid to keep your pee pale yellow. This information is not intended to replace advice given to you by your health care provider. Make sure  you discuss any questions you have with your health care provider. Document Released: 01/24/2008 Document Revised: 07/25/2018 Document Reviewed: 02/14/2018 Elsevier Patient Education  2020 [employer].

## 2019-03-26 NOTE — Progress Notes (Addendum)
PROGRESS NOTE  [name]. RDE:[id number] DOB: [birth year] DOA: 03/21/2019 PCP: Caren Macadam, MD  HPI/Recap of past 24 hours: Frank Arellanois a 58 y.o.malewith medical history significant forcva with right sided facial droop, acoustic neuroma, CAD s/p cabg 2009, htn, gout, insulin-dependent dm, dCHF, paf s/p ablation on anticoagulation, ckd3/4, chronic hypoxic respiratory failure on 3 L [place] O2 at home, morbid obesity, who presents with above.   History, unfortunately, obtained mainly from ED staff as patient remains confused and unable to reach patient's wife.   Per report patient's wife alerted EMS for several days worsening shortness of breath, somnolence, and confusion. No reported chest pain or significant cough. Reportedly compliant with medications. On 3 L [place] O2 at home. Per report also has had worsening lower extremity swelling.  03/26/19: Patient was seen and examined at his bedside this morning.  He is adamant about going home.  Troponins trending up with worsening renal function.    Assessment/Plan: Principal Problem:   Acute on chronic respiratory failure with hypoxia and hypercapnia (HCC) Active Problems:   CAD (coronary artery disease) of artery bypass graft   Obesity hypoventilation syndrome (HCC)   Acoustic neuroma (HCC)   Status post craniotomy   Facial nerve palsy   Pressure injury of skin   Acute diastolic heart failure (HCC)   CKD (chronic kidney disease) stage 3, GFR 30-59 ml/min (HCC)   Insulin dependent diabetes mellitus ([place])   Morbid obesity with BMI of 50.0-59.9, adult (HCC)   Respiratory failure (HCC)   Acute on chronic hypoxic hypercarbic respiratory failure likely multifactorial secondary to acute on chronic diastolic CHF versus fluid overload from worsening CKD Ongoing diuresing with torsemide 60 mg twice daily Defer diuretics to cardiology Net I&O -5.6 L since admission Last 2D echo done on 02/22/2019 showed LVEF 55  to 60% Continue strict I's and O's and daily weight Home O2 evaluation prior to DC  [name] UTI Ucx >100,000 colonies Frank R. Multi drug resistant Sensitive to rocephin, completed 5 days. Sensitive to ciprofloxacin, switch to Levaquin 250 mg daily x 5 days.  Elevated troponin Denies chest pain Troponin trending up Continue to closely monitor on telemetry Continue beta-blocker, statin and Imdur  AKI on CKD 4 Baseline creatinine appears to be 2.3 with GFR of 26 Creatinine today worsening 2.65 from 2.48 yesterday Avoid nephrotoxins Continue to monitor urine output Repeat BMP in the morning  Acute on chronic diastolic CHF Last 2D echo done on 02/22/2019 showed preserved LVEF 55 to 60% Cardiology consulted and following Currently on Coreg 12.5 mg twice daily, torsemide 60 mg twice daily, Imdur 30 mg daily  Paroxysmal A. fib status post DCCV on 04/2018 Currently in sinus rhythm Continue amiodarone Continue Eliquis for secondary CVA prevention  Resolved acute metabolic encephalopathy Suspect secondary to hypercarbia  Physical debility/ambulatory dysfunction PT assessed and recommended home health PT Fall precaution  Type 2 diabetes with hyperglycemia Last hemoglobin A1c 8.8 Continue insulin sliding scale  History of CVA Continue statin On Eliquis for paroxysmal A. fib, no aspirin.   DVT prophylaxis: eliquis Code Status: full  Family Communication: called wife 8/2 Disposition Plan: pending further improvement  Consultants:   cardiology  Procedures:  none    Objective: Vitals:   03/25/19 1800 03/25/19 2024 03/26/19 0429 03/26/19 1440  BP:  (!) 108/58 (!) 144/67 135/87  Pulse:  75 69 83  Resp:  20 18 20   Temp:  98 F (36.7 C) 98 F (36.7 C) 98.4 F (  36.9 C)  TempSrc:  Oral  Oral  SpO2: 93% 97% 95% 93%  Weight:      Height:        Intake/Output Summary (Last 24 hours) at 03/26/2019 1701 Last data filed at 03/26/2019 0618 Gross per  24 hour  Intake 340 ml  Output 900 ml  Net -560 ml   Filed Weights   03/22/19 0411 03/23/19 0440 03/24/19 0500  Weight: (!) 169.4 kg (!) 171.7 kg (!) 168.9 kg    Exam:  . General: 58 y.o. year-old male well developed well nourished in no acute distress.  Alert and oriented x3. . Cardiovascular: Regular rate and rhythm with no rubs or gallops.  No thyromegaly or JVD noted.   Marland Kitchen Respiratory: Mild rales at bases no wheezes noted.  Poor inspiratory effort.  . Abdomen: Soft nontender nondistended with normal bowel sounds x4 quadrants. . Musculoskeletal: Trace lower extremity edema. 2/4 pulses in all 4 extremities. Marland Kitchen Psychiatry: Mood is appropriate for condition and setting   Data Reviewed: CBC: Recent Labs  Lab 03/21/19 2128 03/22/19 0730 03/23/19 0803 03/24/19 0219 03/25/19 0344 03/26/19 0355  WBC 7.5 8.3 8.6 11.8* 10.9* 9.3  NEUTROABS 5.0  --   --   --   --   --   HGB 11.5* 11.3* 11.5* 11.4* 11.7* 11.1*  HCT 42.2 39.9 39.9 41.3 42.7 39.4  MCV 107.4* 103.9* 105.8* 105.1* 107.3* 105.3*  PLT 205 178 215 253 211 732   Basic Metabolic Panel: Recent Labs  Lab 03/22/19 0730 03/23/19 0803 03/24/19 0219 03/25/19 0344 03/26/19 0355  NA 144 144 141 141 140  K 3.8 4.0 3.6 4.4 4.0  CL 98 98 95* 101 99  CO2 32 35* 34* 32 31  GLUCOSE 160* 157* 169* 150* 155*  BUN 25* 34* 39* 38* 42*  CREATININE 2.37* 2.53* 2.77* 2.48* 2.65*  CALCIUM 8.8* 9.0 8.6* 8.8* 8.5*  MG 2.1 2.4 2.4 2.5* 2.7*   GFR: Estimated Creatinine Clearance: 48.4 mL/min (A) (by C-G formula based on SCr of 2.65 mg/dL (H)). Liver Function Tests: Recent Labs  Lab 03/22/19 0730 03/23/19 0803 03/24/19 0219 03/25/19 0344 03/26/19 0355  AST 20 21 19 21 19   ALT 23 23 23 25 24   ALKPHOS 250* 257* 255* 262* 256*  BILITOT 0.5 0.6 0.4 0.3 0.4  PROT 7.1 7.6 7.8 7.5 7.3  ALBUMIN 3.0* 3.2* 3.1* 3.1* 3.1*   No results for input(s): LIPASE, AMYLASE in the last 168 hours. Recent Labs  Lab 03/22/19 1927  AMMONIA 30    Coagulation Profile: Recent Labs  Lab 03/21/19 2128  INR 1.1   Cardiac Enzymes: No results for input(s): CKTOTAL, CKMB, CKMBINDEX, TROPONINI in the last 168 hours. BNP (last 3 results) No results for input(s): PROBNP in the last 8760 hours. HbA1C: No results for input(s): HGBA1C in the last 72 hours. CBG: Recent Labs  Lab 03/25/19 1629 03/25/19 2026 03/26/19 0739 03/26/19 1125 03/26/19 1636  GLUCAP 164* 170* 153* 275* 186*   Lipid Profile: No results for input(s): CHOL, HDL, LDLCALC, TRIG, CHOLHDL, LDLDIRECT in the last 72 hours. Thyroid Function Tests: No results for input(s): TSH, T4TOTAL, FREET4, T3FREE, THYROIDAB in the last 72 hours. Anemia Panel: No results for input(s): VITAMINB12, FOLATE, FERRITIN, TIBC, IRON, RETICCTPCT in the last 72 hours. Urine analysis:    Component Value Date/Time   COLORURINE YELLOW 03/21/2019 2129   APPEARANCEUR CLEAR 03/21/2019 2129   LABSPEC 1.009 03/21/2019 2129   PHURINE 5.0 03/21/2019 2129   GLUCOSEU NEGATIVE  03/21/2019 2129   HGBUR SMALL (A) 03/21/2019 2129   BILIRUBINUR NEGATIVE 03/21/2019 2129   KETONESUR NEGATIVE 03/21/2019 2129   PROTEINUR NEGATIVE 03/21/2019 2129   UROBILINOGEN 0.2 11/19/2012 2217   NITRITE POSITIVE (A) 03/21/2019 2129   LEUKOCYTESUR MODERATE (A) 03/21/2019 2129   Sepsis Labs: @LABRCNTIP (procalcitonin:4,lacticidven:4)  ) Recent Results (from the past 240 hour(s))  Urine culture     Status: Abnormal   Collection Time: 03/21/19  9:29 PM   Specimen: Urine, Random  Result Value Ref Range Status   Specimen Description   Final    URINE, RANDOM Performed at [facility], [place] [address], [place], [place] [address]    Special Requests   Final    NONE Performed at [facility], [place] [address] [place], [place] [address]    Culture >=100,000 COLONIES/mL [name] (A)  Final   Report Status 03/24/2019 FINAL  Final   Organism ID, Bacteria Frank  Arellano (A)  Final      Susceptibility   [name] - MIC*    AMPICILLIN >=32 RESISTANT Resistant     CEFAZOLIN >=64 RESISTANT Resistant     CEFTRIAXONE <=1 SENSITIVE Sensitive     CIPROFLOXACIN <=0.25 SENSITIVE Sensitive     GENTAMICIN <=1 SENSITIVE Sensitive     IMIPENEM 1 SENSITIVE Sensitive     NITROFURANTOIN 256 RESISTANT Resistant     TRIMETH/SULFA >=320 RESISTANT Resistant     AMPICILLIN/SULBACTAM 16 INTERMEDIATE Intermediate     PIP/TAZO <=4 SENSITIVE Sensitive     * >=100,000 COLONIES/mL [name]  SARS Coronavirus 2 [facility] order, Performed in [place] hospital lab)     Status: None   Collection Time: 03/21/19  9:30 PM  Result Value Ref Range Status   SARS Coronavirus 2 NEGATIVE NEGATIVE Final    Comment: (NOTE) If result is NEGATIVE SARS-CoV-2 target nucleic acids are NOT DETECTED. The SARS-CoV-2 RNA is generally detectable in upper and lower  respiratory specimens during the acute phase of infection. The lowest  concentration of SARS-CoV-2 viral copies this assay can detect is 250  copies / mL. A negative result does not preclude SARS-CoV-2 infection  and should not be used as the sole basis for treatment or other  patient management decisions.  A negative result may occur with  improper specimen collection / handling, submission of specimen other  than nasopharyngeal swab, presence of viral mutation(s) within the  areas targeted by this assay, and inadequate number of viral copies  (<250 copies / mL). A negative result must be combined with clinical  observations, patient history, and epidemiological information. If result is POSITIVE SARS-CoV-2 target nucleic acids are DETECTED. The SARS-CoV-2 RNA is generally detectable in upper and lower  respiratory specimens dur ing the acute phase of infection.  Positive  results are indicative of active infection with SARS-CoV-2.  Clinical  correlation with patient history and other diagnostic  information is  necessary to determine patient infection status.  Positive results do  not rule out bacterial infection or co-infection with other viruses. If result is PRESUMPTIVE POSTIVE SARS-CoV-2 nucleic acids MAY BE PRESENT.   A presumptive positive result was obtained on the submitted specimen  and confirmed on repeat testing.  While 2019 novel coronavirus  (SARS-CoV-2) nucleic acids may be present in the submitted sample  additional confirmatory testing may be necessary for epidemiological  and / or clinical management purposes  to differentiate between  SARS-CoV-2 and other Sarbecovirus currently known to infect humans.  If  clinically indicated additional testing with an alternate test  methodology [phone]) is advised. The SARS-CoV-2 RNA is generally  detectable in upper and lower respiratory sp ecimens during the acute  phase of infection. The expected result is Negative. Fact Sheet for Patients:  StrictlyIdeas.no Fact Sheet for Healthcare Providers: BankingDealers.co.za This test is not yet approved or cleared by the [place] FDA and has been authorized for detection and/or diagnosis of SARS-CoV-2 by FDA under an Emergency Use Authorization (EUA).  This EUA will remain in effect (meaning this test can be used) for the duration of the COVID-19 declaration under Section 564(b)(1) of the Act, 21 U.S.C. section 360bbb-3(b)(1), unless the authorization is terminated or revoked sooner. Performed at [facility], [place] [address], [place], [place] [address]   MRSA PCR Screening     Status: None   Collection Time: 03/22/19  4:11 AM   Specimen: Nasopharyngeal  Result Value Ref Range Status   MRSA by PCR NEGATIVE NEGATIVE Final    Comment:        The GeneXpert MRSA Assay (FDA approved for NASAL specimens only), is one component of a comprehensive MRSA colonization surveillance program. It is not intended to  diagnose MRSA infection nor to guide or monitor treatment for MRSA infections. Performed at [facility], [place] [address] [place], [place] [address]       Studies: No results found.  Scheduled Meds: . amiodarone  100 mg Oral Daily  . apixaban  5 mg Oral BID  . carvedilol  12.5 mg Oral BID WC  . chlorhexidine  15 mL Mouth Rinse BID  . Chlorhexidine Gluconate Cloth  6 each Topical Daily  . insulin aspart  0-20 Units Subcutaneous TID WC  . insulin aspart  0-5 Units Subcutaneous QHS  . insulin glargine  40 Units Subcutaneous QHS  . isosorbide mononitrate  30 mg Oral Daily  . [START ON 03/27/2019] levofloxacin  250 mg Oral Daily  . levofloxacin  500 mg Oral Once  . mouth rinse  15 mL Mouth Rinse q12n4p  . polyvinyl alcohol  1 drop Right Eye QID  . potassium chloride SA  40 mEq Oral BID  . rosuvastatin  20 mg Oral q1800  . sodium chloride flush  3 mL Intravenous Q12H  . topiramate  100 mg Oral QHS  . torsemide  60 mg Oral BID    Continuous Infusions: . sodium chloride       LOS: 4 days     Kayleen Memos, MD [facility] Pager [phone]  If 7PM-7AM, please contact night-coverage [email or web address] Password [email or web address] 03/26/2019, 5:01 PM

## 2019-03-27 LAB — GLUCOSE, CAPILLARY
Glucose-Capillary: 135 mg/dL — ABNORMAL HIGH (ref 70–99)
Glucose-Capillary: 214 mg/dL — ABNORMAL HIGH (ref 70–99)
Glucose-Capillary: 189 mg/dL — ABNORMAL HIGH (ref 70–99)

## 2019-03-27 LAB — BASIC METABOLIC PANEL
Anion gap: 8 (ref 5–15)
BUN: 38 mg/dL — ABNORMAL HIGH (ref 6–20)
CO2: 34 mmol/L — ABNORMAL HIGH (ref 22–32)
Calcium: 8.3 mg/dL — ABNORMAL LOW (ref 8.9–10.3)
Chloride: 98 mmol/L (ref 98–111)
Creatinine, Ser: 2.48 mg/dL — ABNORMAL HIGH (ref 0.61–1.24)
GFR calc Af Amer: 32 mL/min — ABNORMAL LOW (ref >60)
GFR calc non Af Amer: 28 mL/min — ABNORMAL LOW (ref >60)
Glucose, Bld: 149 mg/dL — ABNORMAL HIGH (ref 70–99)
Potassium: 4 mmol/L (ref 3.5–5.1)
Sodium: 140 mmol/L (ref 135–145)

## 2019-03-27 MED ORDER — FUROSEMIDE 10 MG/ML IJ SOLN
20.0000 mg | Freq: Once | INTRAMUSCULAR | Status: AC
  Administered 2019-03-27: 20 mg via INTRAVENOUS
  Filled 2019-03-27: qty 2

## 2019-03-27 MED ORDER — LEVOFLOXACIN 750 MG PO TABS: 750.0000 mg | ORAL_TABLET | ORAL | 0 refills | Status: AC

## 2019-03-27 NOTE — Progress Notes (Signed)
Attempted to call patient's wife multiple times to inform her that patient is discharged but patient's wife didn't answer phone calls and I was unable to leave a voice mail

## 2019-03-27 NOTE — Discharge Summary (Addendum)
Discharge Summary  [name]. GYI:[id number] DOB: October 17, 1960  PCP: Caren Macadam, MD  Admit date: 03/21/2019 Discharge date: 03/27/2019  Time spent: 35 minutes  Recommendations for Outpatient Follow-up:  1. Follow-up with your cardiologist 2. Follow-up with your primary care provider 3. Take your medications as prescribed 4. Continue physical therapy 5. 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    Discharge Condition: Stable  Diet recommendation: Heart healthy low-sodium carb modified diet.  Vitals:   03/26/19 2117 03/27/19 1230  BP: 132/85 129/79  Pulse: 81 77  Resp: 20 18  Temp: 98.7 F (37.1 C) 98.5 F (36.9 C)  SpO2: 93% 94%    History of present illness:  [name].is a 58 y.o.malewith medical history significant forcva with right sided facial droop, acoustic neuroma, CAD s/p cabg 2009, htn, gout, insulin-dependent dm, dCHF, paf s/p ablation on anticoagulation, ckd3/4, chronic hypoxic respiratory failure on 3 L [place] O2 at home, morbid obesity, who presents with several days worsening shortness of breath, somnolence, and confusion. No reported chest pain or significant cough. On 3 L [place] O2 at home. Also had  worsening lower extremity swelling.  Admitted for acute on chronic hypoxic hypercarbic respiratory failure likely secondary to acute on chronic diastolic CHF.  Diuresed well with net I&O -8.4 L since admission.  Cardiology followed and signed off on 03/26/2019.  03/27/19: Patient was seen and examined at his bedside.  He wants to go home.  He denies any chest pain, palpitations or dyspnea at rest.  Vital signs and labs reviewed and are stable.  On the day of discharge, the patient was hemodynamically stable.  He will need to follow-up with his cardiologist and primary care provider posthospitalization.  He will also need to continue physical therapy.  Fall precautions.    Hospital Course:  Principal Problem:   Acute on chronic respiratory failure with hypoxia and hypercapnia (HCC) Active Problems:   CAD (coronary artery disease) of artery bypass graft   Obesity hypoventilation syndrome (HCC)   Acoustic neuroma (HCC)   Status post craniotomy   Facial nerve palsy   Pressure injury of skin   Acute diastolic heart failure (HCC)   CKD (chronic kidney disease) stage 3, GFR 30-59 ml/min (HCC)   Insulin dependent diabetes mellitus ([place])   Morbid obesity with BMI of 50.0-59.9, adult (HCC)   Respiratory failure (HCC)  Acute on chronic hypoxic hypercarbic respiratory failure likely multifactorial secondary to acute on chronic diastolic CHF versus fluid overload from worsening CKD Ongoing diuresing with torsemide 60 mg twice daily Defer diuretics to cardiology Net I&O -8.4 L since admission Last 2D echo done on 02/22/2019 showed LVEF 55 to 60% Continue strict I's and O's and daily weight Home O2 evaluation prior to DC Follow-up with cardiology outpatient  Providencia rettgeri UTI Ucx >100,000 colonies providencia R. Multi drug resistant Sensitive to rocephin, completed 5 days. Sensitive to ciprofloxacin, switch to Levaquin 750 mg Q48H x 5 days, start date 03/26/2019.  End date 04/01/2019. Follow-up  with your primary care provider.  Elevated troponin Denies anginal symptoms. Troponin trending up Continue to closely monitor on telemetry Continue beta-blocker, statin and Imdur  Resolving AKI on CKD 4 Baseline creatinine appears to be 2.3 with GFR of 26 Creatinine today improving 2.48 on 03/27/2019 from 2.65 from 2.48  Continue to avoid nephrotoxins Follow-up with your PCP  Acute on chronic diastolic CHF Last 2D echo done on 02/22/2019 showed preserved LVEF 55 to 60% Cardiology consulted and followed Currently on Coreg 12.5 mg twice daily, torsemide 60 mg twice daily, Imdur 30 mg daily, amiodarone 100 mg daily, Eliquis 5 mg twice daily, Crestor 20 mg daily  Paroxysmal A. fib status post DCCV on 04/2018 Currently in sinus rhythm Continue Coreg for rate control Continue amiodarone for rhythm control Continue Eliquis for secondary CVA prevention Follow-up with your cardiologist  Resolved acute metabolic encephalopathy Suspect multifactorial secondary to hypercarbia, UTI  Physical debility/ambulatory dysfunction PT assessed and recommended home health PT Fall precautions  Type 2 diabetes with hyperglycemia Last hemoglobin A1c 8.8 Resume home antidiabetic regimen  History of CVA Continue statin On Eliquis for paroxysmal A. fib, no aspirin.   Code Status:full    Consultants:  cardiology  Procedures:  none  Discharge Exam: BP 129/79 (BP Location: Right Wrist)    Pulse 77    Temp 98.5 F (36.9 C) (Oral)    Resp 18    Ht _0  (1.803 m)    Wt (!) 168.9 kg    SpO2 94%    BMI 51.93 kg/m   General: 58 y.o. year-old male severe morbid obese in no acute distress.  Alert and interactive.    Cardiovascular: Regular rate and rhythm with no rubs or gallops.  No thyromegaly or JVD noted.    Respiratory: Clear to auscultation with no wheezes or rales.  Poor inspiratory effort.  Abdomen: Severe morbidly obese nontender nondistended with normal bowel sounds  present.  Musculoskeletal: Trace lower extremity edema.   Psychiatry: Mood is appropriate for condition and setting  Discharge Instructions You were cared for by a hospitalist during your hospital stay. If you have any questions about your discharge medications or the care you received while you were in the hospital after you are discharged, you can call the unit and asked to speak with the hospitalist on call if the hospitalist that took care of you is not available. Once you are discharged, your primary care physician will handle any further medical issues. Please note that NO REFILLS for any discharge medications will be authorized once you are discharged, as it is imperative that you return to your primary care physician (or establish a relationship with a primary care physician if you do not have one) for your aftercare needs so that they can reassess your need for medications and monitor your lab values.  Discharge Instructions    Call MD for:  difficulty breathing, headache or visual disturbances   Complete by: As directed    Call MD for:  extreme fatigue   Complete by: As directed    Call MD for:  persistant dizziness or light-headedness   Complete by: As directed    Call MD for:  persistant nausea and vomiting   Complete by: As directed    Call MD for:  redness, tenderness, or signs of infection (pain, swelling, redness, odor or green/yellow discharge around incision site)   Complete by: As directed    Call MD for:  severe uncontrolled pain   Complete by: As directed    Call MD for:  temperature >100.4   Complete by: As directed    Diet - low sodium heart healthy   Complete by: As directed    Discharge instructions   Complete by: As directed    You were seen for a heart failure exacerbation.  You were diuresed and have improved.  You're being treated for a urinary tract infection.  We'll discharge you home with an additional 4 days of antibiotics.    Please resume your home  dose of torsemide when you go home.  Follow up with your PCP and your cardiologist in a few days to follow up your volume status and electrolytes.  You'll need to schedule an outpatient sleep study through your PCP.  This is extremely important as your probably need a CPAP or BIPAP at night.  You should follow up your elevated alkaline phosphatase and GGT as an outpatient.  Please discuss this with your PCP.  Also, please follow your macrocytosis and the workup here (pending peripheral smear) with your PCP.    Be cautious with your pain medications.  These cause respiratory depression and with your medical problems you're at high risk of complications with this.  Return for new, recurrent, or worsening symptoms.  Please ask your PCP to request records from this hospitalization so they know what was done and what the next steps will be.   Discharge wound care:   Complete by: As directed    Cleanse sacral wound with saline. Pat dry. Apply a hydrocolloid dressing Kellie Simmering [phone]) over the wound.  Change daily.   Increase activity slowly   Complete by: As directed      Allergies as of 03/27/2019      Reactions   Morphine And Related Anaphylaxis   "makes me stop breathing"   Novocain [procaine] Anaphylaxis   Carbamazepine    shaking   Codeine Other (See Comments)   "makes heart race" per patient   Gabapentin Other (See Comments)   Blood in urine   Ivp Dye [iodinated Diagnostic Agents]    Passing out   Other    Steroids makes hearts race   Prednisone    Heart racing      Medication List    TAKE these medications   albuterol 108 (90 Base) MCG/ACT inhaler Commonly known as: VENTOLIN HFA Inhale 2 puffs into the lungs every 6 (six) hours as needed for wheezing or shortness of breath.   amiodarone 100 MG tablet Commonly known as: PACERONE Take 1 tablet (100 mg total) by mouth daily.   apixaban 5 MG Tabs tablet Commonly known as: Eliquis Take 1 tablet (5 mg total) by mouth 2 (two)  times daily. What changed: when to take this   artificial tears Oint ophthalmic ointment Place 1 application into both eyes at bedtime as needed for dry eyes.   carvedilol 12.5 MG tablet Commonly known as: COREG Take 1 tablet (12.5 mg total) by mouth 2 (two) times daily with a meal.   DuoDERM Hydroactive Gel duoderm or generic equivalent gel dressing. 4cmx4cm. Apply to wound q 3 days until healed.   guaiFENesin 600 MG 12 hr tablet Commonly known as: MUCINEX Take 2 tablets (1,200 mg total) by mouth 2 (two) times daily.   HYDROcodone-acetaminophen 10-325  MG tablet Commonly known as: NORCO TAKE 1 TABLET BY MOUTH EVERY 6 HOURS AS NEEDED FOR PAIN MUST LAST 30 DAYS What changed:   how much to take  how to take this  when to take this  reasons to take this  additional instructions   hydroxypropyl methylcellulose / hypromellose 2.5 % ophthalmic solution Commonly known as: ISOPTO TEARS / GONIOVISC Place 1 drop into both eyes 3 (three) times daily as needed for dry eyes.   Insulin Pen Needle 31G X 8 MM Misc 1 Units by Does not apply route 2 (two) times a day.   isosorbide mononitrate 30 MG 24 hr tablet Commonly known as: IMDUR Take 1 tablet (30 mg total) by mouth daily.   Lantus 100 UNIT/ML injection Generic drug: insulin glargine INJECT 45 UNITS INTO THE SKIN AT BEDTIME FOR 120 DOSES What changed: See the new instructions.   levofloxacin 750 MG tablet Commonly known as: LEVAQUIN Take 1 tablet (750 mg total) by mouth every other day for 4 days. Start taking on: March 28, 2019   nystatin powder Commonly known as: MYCOSTATIN/NYSTOP APPLY  1  GRAM TOPICALLY TWICE DAILY AS NEEDED What changed:   how much to take  how to take this  when to take this  reasons to take this  additional instructions   ONE TOUCH ULTRA MINI w/Device Kit Use as directed twice a day   ONE TOUCH ULTRA TEST test strip Generic drug: glucose blood Use as instructed twice a day     onetouch ultrasoft lancets Use as instructed   potassium chloride SA 20 MEQ tablet Commonly known as: K-DUR Take 2 tablets (40 mEq total) by mouth 2 (two) times daily.   rosuvastatin 20 MG tablet Commonly known as: CRESTOR Take 1 tablet (20 mg total) by mouth daily at 6 PM.   tiZANidine 4 MG tablet Commonly known as: ZANAFLEX TAKE 1 TABLET BY MOUTH FOUR TIMES DAILY AS NEEDED FOR MUSCLE SPASMS What changed:   how much to take  how to take this  when to take this  reasons to take this  additional instructions   topiramate 100 MG tablet Commonly known as: TOPAMAX Take 1 tablet (100 mg total) by mouth at bedtime.   torsemide 20 MG tablet Commonly known as: DEMADEX TAKE 3 TABLETS (60 MG TOTAL) BY MOUTH 2 TIMES DAILY. What changed: See the new instructions.            Durable Medical Equipment  (From admission, onward)         Start     Ordered   03/26/19 1640  For home use only DME oxygen  Once    Question Answer Comment  Length of Need 6 Months   Mode or (Route) Nasal cannula   Liters per Minute 3   Frequency Continuous (stationary and portable oxygen unit needed)   Oxygen conserving device Yes   Oxygen delivery system Gas      03/26/19 1639           Discharge Care Instructions  (From admission, onward)         Start     Ordered   03/25/19 0000  Discharge wound care:    Comments: Cleanse sacral wound with saline. Pat dry. Apply a hydrocolloid dressing Kellie Simmering [phone]) over the wound.  Change daily.   03/25/19 1400         Allergies  Allergen Reactions   Morphine And Related Anaphylaxis    "makes me stop breathing"  Novocain [Procaine] Anaphylaxis   Carbamazepine     shaking   Codeine Other (See Comments)    "makes heart race" per patient   Gabapentin Other (See Comments)    Blood in urine   Ivp Dye [Iodinated Diagnostic Agents]     Passing out   Other     Steroids makes hearts race   Prednisone     Heart racing    Follow-up Information    Koberlein, Steele Berg, MD. Call in 1 day(s).   Specialty: Family Medicine Why: Please call for a post hospital follow-up appointment. Contact information: [place] [address] [phone]        [name], MD Follow up.   Specialty: Cardiology Why: Dr. Claris Gladden office will call you with a hospital follow up appt in 1-2 weeks  Contact information: [place] [address] [phone]            The results of significant diagnostics from this hospitalization (including imaging, microbiology, ancillary and laboratory) are listed below for reference.    Significant Diagnostic Studies: Dg Chest 2 View  Result Date: 03/23/2019 CLINICAL DATA:  Shortness of breath and cough EXAM: CHEST - 2 VIEW COMPARISON:  Chest radiograph 03/21/2019 FINDINGS: Monitoring leads overlie the patient. Stable cardiomegaly status post median sternotomy. Pulmonary vascular redistribution and mild interstitial opacities bilaterally. No pleural effusion or pneumothorax. Thoracic spine degenerative changes. IMPRESSION: Cardiomegaly and interstitial opacities suggestive of mild interstitial edema. Electronically Signed   By: Lovey Newcomer M.D.   On: 03/23/2019 11:51   Ct Head Wo Contrast  Result Date: 03/21/2019 CLINICAL DATA:  Altered level of consciousness, shortness of Breath EXAM: CT HEAD WITHOUT CONTRAST TECHNIQUE: Contiguous axial images were obtained from the base of the skull through the vertex without intravenous contrast. COMPARISON:  04/30/2018 FINDINGS: Brain: No acute intracranial abnormality. Specifically, no hemorrhage, hydrocephalus, mass lesion, acute infarction, or significant intracranial injury. Stable encephalomalacia at the right CP angle and overlying craniectomy defect. Vascular: No hyperdense vessel or unexpected calcification. Skull: No acute calvarial abnormality. Sinuses/Orbits: Visualized paranasal sinuses and  mastoids clear. Orbital soft tissues unremarkable. Other: Prior right temporal occipital craniectomy. IMPRESSION: No acute intracranial abnormality. Electronically Signed   By: Rolm Baptise M.D.   On: 03/21/2019 23:17   Dg Chest Port 1 View  Result Date: 03/21/2019 CLINICAL DATA:  Shortness of breath and lethargy EXAM: PORTABLE CHEST 1 VIEW COMPARISON:  February 23, 2019. FINDINGS: There is cardiomegaly with pulmonary venous hypertension. There is slight interstitial edema. There is no consolidation. Patient is status post coronary artery bypass grafting. No bone lesions. IMPRESSION: Pulmonary vascular congestion with mild interstitial edema. There is felt to be a degree of congestive heart failure. No consolidation. Cardiomegaly is stable compared to most recent study. Electronically Signed   By: Lowella Grip III M.D.   On: 03/21/2019 21:44    Microbiology: Recent Results (from the past 240 hour(s))  Urine culture     Status: Abnormal   Collection Time: 03/21/19  9:29 PM   Specimen: Urine, Random  Result Value Ref Range Status   Specimen Description   Final    URINE, RANDOM Performed at [facility] [address], [place], [place] [address]    Special Requests   Final    NONE Performed at [facility] [place] [address], [place], [place] [address]    Culture >=100,000 COLONIES/mL PROVIDENCIA RETTGERI (A)  Final   Report Status 03/24/2019 FINAL  Final   Organism ID, Bacteria PROVIDENCIA RETTGERI (A)  Final      Susceptibility   Providencia rettgeri - MIC*    AMPICILLIN >=32 RESISTANT Resistant     CEFAZOLIN >=64 RESISTANT Resistant     CEFTRIAXONE <=1 SENSITIVE Sensitive     CIPROFLOXACIN <=0.25 SENSITIVE Sensitive     GENTAMICIN <=1 SENSITIVE Sensitive     IMIPENEM 1 SENSITIVE Sensitive     NITROFURANTOIN 256 RESISTANT Resistant     TRIMETH/SULFA >=320 RESISTANT Resistant     AMPICILLIN/SULBACTAM 16 INTERMEDIATE Intermediate     PIP/TAZO <=4  SENSITIVE Sensitive     * >=100,000 COLONIES/mL PROVIDENCIA RETTGERI  SARS Coronavirus 2 [facility] order, Performed in [place] hospital lab)     Status: None   Collection Time: 03/21/19  9:30 PM  Result Value Ref Range Status   SARS Coronavirus 2 NEGATIVE NEGATIVE Final    Comment: (NOTE) If result is NEGATIVE SARS-CoV-2 target nucleic acids are NOT DETECTED. The SARS-CoV-2 RNA is generally detectable in upper and lower  respiratory specimens during the acute phase of infection. The lowest  concentration of SARS-CoV-2 viral copies this assay can detect is 250  copies / mL. A negative result does not preclude SARS-CoV-2 infection  and should not be used as the sole basis for treatment or other  patient management decisions.  A negative result may occur with  improper specimen collection / handling, submission of specimen other  than nasopharyngeal swab, presence of viral mutation(s) within the  areas targeted by this assay, and inadequate number of viral copies  (<250 copies / mL). A negative result must be combined with clinical  observations, patient history, and epidemiological information. If result is POSITIVE SARS-CoV-2 target nucleic acids are DETECTED. The SARS-CoV-2 RNA is generally detectable in upper and lower  respiratory specimens dur ing the acute phase of infection.  Positive  results are indicative of active infection with SARS-CoV-2.  Clinical  correlation with patient history and other diagnostic information is  necessary to determine patient infection status.  Positive results do  not rule out bacterial infection or co-infection with other viruses. If result is PRESUMPTIVE POSTIVE SARS-CoV-2 nucleic acids MAY BE PRESENT.   A presumptive positive result was obtained on the submitted specimen  and confirmed on repeat testing.  While 2019 novel coronavirus  (SARS-CoV-2) nucleic acids may be present in the submitted sample  additional confirmatory testing may be  necessary for epidemiological  and / or clinical management purposes  to differentiate between  SARS-CoV-2 and other Sarbecovirus currently known to infect humans.  If clinically indicated additional testing with an alternate test  methodology [phone]) is advised. The SARS-CoV-2 RNA is generally  detectable in upper and lower respiratory sp ecimens during the acute  phase of infection. The expected result is Negative. Fact Sheet for Patients:  StrictlyIdeas.no Fact Sheet for Healthcare Providers: BankingDealers.co.za This test is not yet approved or cleared by the [place] FDA and has been authorized for detection and/or diagnosis of SARS-CoV-2 by FDA under an Emergency Use Authorization (EUA).  This EUA will remain in effect (meaning this test can be used) for the duration of the COVID-19 declaration under Section 564(b)(1) of the Act, 21 U.S.C. section 360bbb-3(b)(1), unless the authorization is terminated or revoked sooner. Performed at [facility], [place] [address] [place], [place] [address]   MRSA PCR Screening     Status: None   Collection Time: 03/22/19  4:11 AM   Specimen: Nasopharyngeal  Result Value Ref Range Status   MRSA by PCR NEGATIVE NEGATIVE Final    Comment:        The GeneXpert MRSA Assay (FDA approved for NASAL specimens only), is one component of a comprehensive MRSA colonization surveillance program. It is not intended to diagnose MRSA infection nor to guide or monitor treatment for MRSA infections. Performed at [facility], [place] [address], [place], [place] [address]      Labs: Basic Metabolic Panel: Recent Labs  Lab 03/22/19 0730 03/23/19 0803 03/24/19 0219 03/25/19 0344 03/26/19 0355 03/27/19 0336  NA 144 144 141 141 140 140  K 3.8 4.0 3.6 4.4 4.0 4.0  CL 98 98 95* 101 99 98  CO2 32 35* 34* 32 31 34*  GLUCOSE 160* 157* 169* 150* 155* 149*  BUN 25*  34* 39* 38* 42* 38*  CREATININE 2.37* 2.53* 2.77* 2.48* 2.65* 2.48*  CALCIUM 8.8* 9.0 8.6* 8.8* 8.5* 8.3*  MG 2.1 2.4 2.4 2.5* 2.7*  --    Liver Function Tests: Recent Labs  Lab 03/22/19 0730 03/23/19 0803 03/24/19 0219 03/25/19 0344 03/26/19 0355  AST _0 ALT _1 ALKPHOS 250* 257* 255* 262* 256*  BILITOT 0.5 0.6 0.4 0.3 0.4  PROT 7.1 7.6 7.8 7.5 7.3  ALBUMIN 3.0* 3.2* 3.1* 3.1* 3.1*   No results for input(s): LIPASE, AMYLASE in the last 168 hours. Recent Labs  Lab 03/22/19 1927  AMMONIA 30   CBC: Recent Labs  Lab 03/21/19 2128 03/22/19 0730 03/23/19 0803 03/24/19 0219 03/25/19 0344 03/26/19 0355  WBC 7.5 8.3 8.6 11.8* 10.9* 9.3  NEUTROABS 5.0  --   --   --   --   --   HGB 11.5* 11.3* 11.5* 11.4* 11.7* 11.1*  HCT 42.2 39.9 39.9 41.3 42.7 39.4  MCV 107.4* 103.9* 105.8* 105.1* 107.3* 105.3*  PLT 205 178 215 253 211 194   Cardiac Enzymes: No results for input(s): CKTOTAL, CKMB, CKMBINDEX, TROPONINI in the last 168 hours. BNP: BNP (last 3 results) Recent Labs    10/09/18 1135 02/22/19 0324 03/21/19 2130  BNP 45.9 230.6* 282.0*    ProBNP (last 3 results) No results for input(s): PROBNP in the last 8760 hours.  CBG: Recent Labs  Lab 03/26/19 1125 03/26/19 1636 03/26/19 2113 03/27/19 0808 03/27/19 1201  GLUCAP 275* 186* 165* 135* 214*       Signed:  Kayleen Memos, MD [facility] 03/27/2019, 1:13 PM

## 2019-03-27 NOTE — TOC Progression Note (Addendum)
Transition of Care (TOC) - Progression Note    Patient Details  Name: [name]. MRN: [id number] Date of Birth: 1961-04-19  Transition [facility]) CM/SW Contact  Purcell Mouton, RN Phone Number: 03/27/2019, 3:21 PM  Clinical Narrative:    Pt was active with [facility], who will resume [facility] care.  Spoke with Dorian Pod, RN in house rep.          Expected Discharge Plan and Services           Expected Discharge Date: 03/27/19                                     Social Determinants of Health (SDOH) Interventions    Readmission Risk Interventions Readmission Risk Prevention Plan 02/24/2019  Transportation Screening Complete  PCP or Specialist Appt within 3-5 Days Not Complete  Not Complete comments see above  [place] or [place] Complete  Social Work Consult for [place] Planning/Counseling Complete  Palliative Care Screening Not Applicable  Medication Review Press photographer) Complete  Some recent data might be hidden

## 2019-03-27 NOTE — Progress Notes (Signed)
SATURATION QUALIFICATIONS: (This note is used to comply with regulatory documentation for home oxygen)  Patient Saturations on Room Air at Rest = 89%  Patient Saturations on Room Air while Ambulating = 73%  Patient Saturations on 4 Liters of oxygen while Ambulating = 93%  Please briefly explain why patient needs home oxygen: patient destuated to 73% on RA, recover to 93% on 4L via nasal cannula

## 2019-03-28 ENCOUNTER — Other Ambulatory Visit: Payer: Self-pay

## 2019-03-28 NOTE — Patient Outreach (Addendum)
  [facility]) Care Management Chronic Special Needs Program  03/28/2019  Name: [name]. DOB: 11-22-1960  MRN: [id number]  [name] is enrolled in a chronic special needs plan for Heart Failure. Client admitted 02/22/2019-02/27/2019 with acute on chronic respiratory failure. Client re-admitted 03/21/2019-03/27/2019 with acute on chronic respiratory with hypoxia and hypercapnia; acute heart failure; UTI. Per chart, also has sacral wound.. Per chart, Client discharged to home with home health to resume [facility]). RNCM reviewed and updated care plan.   Goals Addressed            This Visit's Progress   . Client will not be readmitted within 30 days (C-SNP)-discharge date 03/27/2019 updated   Not on track    Please follow discharge instructions and call provider if you have any questions. Please attend all follow up appointments as scheduled. Please take your medications as prescribed. Please call 24 hour nurse advice line as needed([phone]).        Plan: RNCM will send updated care plan to client and to primary care. Transition of care per utilization management.     Thea Silversmith, RN, MSN, [place] [phone]   .

## 2019-03-28 NOTE — Progress Notes (Signed)
[facility] will service Pt. Related to pt declining [place]. [facility] for this coming Wednesday 12th.

## 2019-03-31 DIAGNOSIS — I5042 Chronic combined systolic (congestive) and diastolic (congestive) heart failure: Secondary | ICD-10-CM | POA: Diagnosis not present

## 2019-04-02 ENCOUNTER — Other Ambulatory Visit: Payer: Self-pay

## 2019-04-02 ENCOUNTER — Ambulatory Visit (INDEPENDENT_AMBULATORY_CARE_PROVIDER_SITE_OTHER): Payer: HMO | Admitting: Family Medicine

## 2019-04-02 DIAGNOSIS — N184 Chronic kidney disease, stage 4 (severe): Secondary | ICD-10-CM | POA: Diagnosis not present

## 2019-04-02 DIAGNOSIS — Z8679 Personal history of other diseases of the circulatory system: Secondary | ICD-10-CM | POA: Diagnosis not present

## 2019-04-02 DIAGNOSIS — E662 Morbid (severe) obesity with alveolar hypoventilation: Secondary | ICD-10-CM | POA: Diagnosis not present

## 2019-04-02 DIAGNOSIS — R740 Nonspecific elevation of levels of transaminase and lactic acid dehydrogenase [LDH]: Secondary | ICD-10-CM

## 2019-04-02 DIAGNOSIS — N171 Acute kidney failure with acute cortical necrosis: Secondary | ICD-10-CM

## 2019-04-02 DIAGNOSIS — I5032 Chronic diastolic (congestive) heart failure: Secondary | ICD-10-CM | POA: Diagnosis not present

## 2019-04-02 DIAGNOSIS — D7589 Other specified diseases of blood and blood-forming organs: Secondary | ICD-10-CM

## 2019-04-02 DIAGNOSIS — R7401 Elevation of levels of liver transaminase levels: Secondary | ICD-10-CM

## 2019-04-02 NOTE — Progress Notes (Signed)
Patient ID: [name]., male   DOB: Nov 27, 1960, 58 y.o.   MRN: [id number]  This visit type was conducted due to national recommendations for restrictions regarding the COVID-19 pandemic in an effort to limit this patient's exposure and mitigate transmission in our community.   Virtual Visit via Telephone Note  I connected with [name] on 04/02/19 at  9:30 AM EDT by telephone and verified that I am speaking with the correct person using two identifiers.   I discussed the limitations, risks, security and privacy concerns of performing an evaluation and management service by telephone and the availability of in person appointments. I also discussed with the patient that there may be a patient responsible charge related to this service. The patient expressed understanding and agreed to proceed.  Location patient: home Location provider: work or home office Participants present for the call: patient, provider Patient did not have a visit in the prior 7 days to address this/these issue(s).   History of Present Illness:  Patient is a poor historian and history is somewhat fragmented.  This is basically a hospital follow-up.  His primary is out on vacation.  He has fairly complicated and extensive past medical history.  History of CAD, chronic diastolic heart failure, obesity hypoventilation syndrome, chronic respiratory failure, type 2 diabetes poorly controlled and treated with insulin, history of acoustic neuroma, history of chronic facial nerve palsy, chronic kidney disease, history of CVA, and morbid obesity.  Recent admission was from 03/21/2019 through 03/27/2019. At baseline, he is on 3 L nasal cannula at home.  He presented with several days of worsening shortness of breath and some confusion.  Denied any chest pain or cough.  He had some increased peripheral edema.  Does not weigh himself at home.  Main issue seemed to be acute on chronic hypoxic hypercarbic respiratory  failure with acute on chronic diastolic heart failure.  Question of some worsening of volume overload from worsening chronic kidney disease.  He had net in and out of -8.4 L from admission.  Echocardiogram 02/22/2019 ejection fraction 55 to 60%.  Feels that he is improved and he states he feels "great ".  He states his respiratory status is stable.  He states his current scales at home are too small to weigh him.  He has not noted any recent increased peripheral edema.  Is on torsemide 60 mg twice daily.  He is on several medications for heart failure including Coreg, torsemide, Imdur  He had UTI with Providencia species and was treated with Rocephin and discharged on Levaquin.  Denies any urinary symptoms at this time.  No fever.  He had elevated troponin but denied any angina symptoms.  Acute kidney injury on chronic kidney disease stage IV.  Baseline creatinine apparently around 2.3.  History of atrial fibrillation status post DCCV on 04/2018.  Remains on Eliquis but apparently not taking with good compliance at times.  He has type 2 diabetes with poor control  Past Medical History:  Diagnosis Date  . Brain tumor ([place])    Takes Topamax so patient will not have headaches  . Bruises easily   . CAD (coronary artery disease)    Cath 09, 99% LAD  . Cardiomyopathy, ischemic    Ischemic  . CHF (congestive heart failure) ([place])   . Cyst near coccyx   . Cyst near tailbone   . Diabetes mellitus without complication (HCC)    borderline  . Edema of both legs    Takes Lasix  .  Fibromyalgia   . Gout   . Headache(784.0)    with lights  . Hypertension    dr Percival Spanish  . Kidney stones   . Myocardial infarction ([place])   . Obesity   . Pneumonia    hx of  . Shortness of breath   . Stroke [facility])    Affected vision, walking, and facial drooping on right face   Past Surgical History:  Procedure Laterality Date  . ACOUSTIC NEUROMA RESECTION N/A 10/11/2012   Procedure: ACOUSTIC NEUROMA RESECTION;   Surgeon: Ascencion Dike, MD;  [facility];  Service: ENT;  Laterality: N/A;  . ANAL FISTULECTOMY    . CARDIOVERSION N/A 05/08/2018   Procedure: CARDIOVERSION;  Surgeon: [name], MD;  Location: [facility] ENDOSCOPY;  Service: Cardiovascular;  Laterality: N/A;  . CORONARY ARTERY BYPASS GRAFT     LIMA to LAD 2009  . CRANIOTOMY Right 11/20/2012   Procedure: CRANIOTOMY REPAIR DURAL/CENTRAL SPINAL FLUID LEAK;  Surgeon: [name], MD;  Location: [place] NEURO ORS;  Service: Neurosurgery;  Laterality: Right;  . EYE SURGERY     to coorect lid and double vision  . MEDIAN RECTUS REPAIR Right 02/16/2016   Procedure: RIGHT LATERAL RECTUS RESECTION; SUPERIOR RECTUS RESECTION RIGHT EYE; RIGHT SUPERIOR RECTUS RECESSION; LATERAL TARSAL STRIP RIGHT LOWER EYELID;  Surgeon: [name], MD;  Location: [place];  Service: Ophthalmology;  Laterality: Right;  . PLACEMENT OF LUMBAR DRAIN N/A 11/20/2012   Procedure: Attempted PLACEMENT OF LUMBAR DRAIN;  Surgeon: [name], MD;  Location: [place] NEURO ORS;  Service: Neurosurgery;  Laterality: N/A;  . RETROSIGMOID CRANIECTOMY FOR TUMOR RESECTION Right 10/11/2012   Procedure: RETROSIGMOID CRANIECTOMY FOR TUMOR RESECTION;  Surgeon: [name], MD;  Location: [place] NEURO ORS;  Service: Neurosurgery;  Laterality: Right;  Craniotomy for acoustic neuroma  . TEE WITHOUT CARDIOVERSION N/A 05/08/2018   Procedure: TRANSESOPHAGEAL ECHOCARDIOGRAM (TEE);  Surgeon: [name], MD;  Location[facility]COPY;  Service: Cardiovascular;  Laterality: N/A;  . TRACHEOSTOMY TUBE PLACEMENT N/A 10/11/2012   Procedure: TRACHEOSTOMY;  Surgeon: Ascencion Dike, MD;  [facility];  Service: ENT;  Laterality: N/A;  . [employer]      reports that he has quit smoking. His smoking use included cigarettes. He has a 5.00 pack-year smoking history. He uses smokeless tobacco. He reports that he does not drink alcohol or use drugs. family history includes CAD in his father; Cancer  in an other family member; Diabetes in his maternal aunt and maternal grandmother; Hypertension in his mother; Leukemia in his cousin and maternal grandfather. Allergies  Allergen Reactions  . Morphine And Related Anaphylaxis    "makes me stop breathing"  . Novocain [Procaine] Anaphylaxis  . Carbamazepine     shaking  . Codeine Other (See Comments)    "makes heart race" per patient  . Gabapentin Other (See Comments)    Blood in urine  . Ivp Dye [Iodinated Diagnostic Agents]     Passing out  . Other     Steroids makes hearts race  . Prednisone     Heart racing        Observations/Objective: Patient sounds cheerful and well on the phone. I do not appreciate any SOB. Speech and thought processing are grossly intact. Patient reported vitals:  Assessment and Plan:  #1 acute on chronic diastolic heart failure.  Unfortunately patient does not monitor weights at home and has no capacity to do so currently.  He feels symptomatically  stable is on torsemide 60 mg twice daily  -Discussed importance of sodium restriction and close monitoring. -Discussed follow-up electrolytes and patient declines at this time.  #2 recent UTI symptomatically improved following treatment with Levaquin.  #3 chronic kidney disease with recent mild worsening.  Creatinine stable at discharge. -Continue to avoid all nephrotoxins  #4 history of chronic atrial fibrillation.  Patient on Eliquis and carvedilol.  Apparently in sinus rhythm on recent admission.  Poor compliance with Eliquis  #5 recent acute metabolic encephalopathy felt probably related to hypercarbia and UTI currently stable and improved  #6 history of type 2 diabetes on insulin regimen at home with history of poor control and poor compliance  #7 recent macrocytosis.  He denies alcohol use.  Recent TSH and B12 normal.  #8 recent elevated GGT and alkaline phosphatase.  He does have known gallstones but no recent abdominal pain.  He has known  fatty liver changes.  Declines repeat labs at this time.    Follow Up Instructions:  -We discussed follow-up and further work-up of multiple issues as above and patient basically is declining any labs at this time. -There is mention of further sleep studies but patient refuses.  He states that there is no way he can wear a mask and he is declining further evaluation even with home studies. -We recommended consideration for follow-up competence of metabolic panel to reassess liver enzymes and renal function electrolytes and he declines -He has small buttock ulcer reportedly left buttock which is been addressed previously.  We offered home health nursing follow-up but he declines   [id number] 5-10 [id number] 11-20 [id number] 21-30 I did not refer this patient for an OV in the next 24 hours for this/these issue(s).  I discussed the assessment and treatment plan with the patient. The patient was provided an opportunity to ask questions and all were answered. The patient agreed with the plan and demonstrated an understanding of the instructions.   The patient was advised to call back or seek an in-person evaluation if the symptoms worsen or if the condition fails to improve as anticipated.  I provided 35 minutes of non-face-to-face time during this encounter.   Carolann Littler, MD

## 2019-04-02 NOTE — Patient Outreach (Addendum)
  [facility]) Care Management Chronic Special Needs Program   04/02/2019  Name: [name]., DOB: [birth year]  MRN: [id number]  The client was discussed in today's interdisciplinary care team meeting.  The following issues were discussed:  Changes in health status, Care Plan, Coordination of care and Care transitions  Participants present:  Gorham Pullium, RNCM; Mahlon Gammon, Kelli Churn, RNCM; RNCM; Peter Garter, RNCM; Thea Silversmith, RNCM; Dr. Marco Collie; Gilda Crease, PharmD, RPh.  [facility] utilization management completing transition of care.  Plan: Care Coordination with utilization management as needed. Client noted to be [facility] eligible, however is not engaged yet. RNCM will follow up with client within the month.   Thea Silversmith, RN, MSN, [place] [phone]

## 2019-04-04 ENCOUNTER — Encounter: Payer: Self-pay | Admitting: Registered Nurse

## 2019-04-04 ENCOUNTER — Other Ambulatory Visit: Payer: Self-pay

## 2019-04-04 ENCOUNTER — Encounter: Payer: HMO | Attending: Physical Medicine & Rehabilitation | Admitting: Registered Nurse

## 2019-04-04 VITALS — BP 135/77 | HR 75 | Temp 98.5°F | Resp 24 | Ht 70.0 in | Wt 315.0 lb

## 2019-04-04 DIAGNOSIS — G894 Chronic pain syndrome: Secondary | ICD-10-CM | POA: Diagnosis not present

## 2019-04-04 DIAGNOSIS — I110 Hypertensive heart disease with heart failure: Secondary | ICD-10-CM | POA: Insufficient documentation

## 2019-04-04 DIAGNOSIS — Z9889 Other specified postprocedural states: Secondary | ICD-10-CM | POA: Diagnosis not present

## 2019-04-04 DIAGNOSIS — G609 Hereditary and idiopathic neuropathy, unspecified: Secondary | ICD-10-CM | POA: Diagnosis not present

## 2019-04-04 DIAGNOSIS — Z8673 Personal history of transient ischemic attack (TIA), and cerebral infarction without residual deficits: Secondary | ICD-10-CM | POA: Diagnosis not present

## 2019-04-04 DIAGNOSIS — R600 Localized edema: Secondary | ICD-10-CM | POA: Diagnosis not present

## 2019-04-04 DIAGNOSIS — Z930 Tracheostomy status: Secondary | ICD-10-CM | POA: Diagnosis not present

## 2019-04-04 DIAGNOSIS — R51 Headache: Secondary | ICD-10-CM | POA: Diagnosis not present

## 2019-04-04 DIAGNOSIS — Z8249 Family history of ischemic heart disease and other diseases of the circulatory system: Secondary | ICD-10-CM | POA: Diagnosis not present

## 2019-04-04 DIAGNOSIS — E119 Type 2 diabetes mellitus without complications: Secondary | ICD-10-CM | POA: Diagnosis not present

## 2019-04-04 DIAGNOSIS — F17210 Nicotine dependence, cigarettes, uncomplicated: Secondary | ICD-10-CM | POA: Insufficient documentation

## 2019-04-04 DIAGNOSIS — M797 Fibromyalgia: Secondary | ICD-10-CM | POA: Diagnosis not present

## 2019-04-04 DIAGNOSIS — Z951 Presence of aortocoronary bypass graft: Secondary | ICD-10-CM | POA: Insufficient documentation

## 2019-04-04 DIAGNOSIS — G510 Bell's palsy: Secondary | ICD-10-CM | POA: Insufficient documentation

## 2019-04-04 DIAGNOSIS — G629 Polyneuropathy, unspecified: Secondary | ICD-10-CM | POA: Diagnosis not present

## 2019-04-04 DIAGNOSIS — I2510 Atherosclerotic heart disease of native coronary artery without angina pectoris: Secondary | ICD-10-CM | POA: Insufficient documentation

## 2019-04-04 DIAGNOSIS — Z809 Family history of malignant neoplasm, unspecified: Secondary | ICD-10-CM | POA: Insufficient documentation

## 2019-04-04 DIAGNOSIS — I252 Old myocardial infarction: Secondary | ICD-10-CM | POA: Insufficient documentation

## 2019-04-04 DIAGNOSIS — Z5181 Encounter for therapeutic drug level monitoring: Secondary | ICD-10-CM

## 2019-04-04 DIAGNOSIS — Z760 Encounter for issue of repeat prescription: Secondary | ICD-10-CM | POA: Diagnosis not present

## 2019-04-04 DIAGNOSIS — I509 Heart failure, unspecified: Secondary | ICD-10-CM | POA: Diagnosis not present

## 2019-04-04 DIAGNOSIS — M109 Gout, unspecified: Secondary | ICD-10-CM | POA: Diagnosis not present

## 2019-04-04 DIAGNOSIS — G8929 Other chronic pain: Secondary | ICD-10-CM | POA: Diagnosis not present

## 2019-04-04 DIAGNOSIS — I255 Ischemic cardiomyopathy: Secondary | ICD-10-CM | POA: Diagnosis not present

## 2019-04-04 DIAGNOSIS — Z87442 Personal history of urinary calculi: Secondary | ICD-10-CM | POA: Diagnosis not present

## 2019-04-04 DIAGNOSIS — D333 Benign neoplasm of cranial nerves: Secondary | ICD-10-CM | POA: Diagnosis not present

## 2019-04-04 DIAGNOSIS — Z79891 Long term (current) use of opiate analgesic: Secondary | ICD-10-CM | POA: Diagnosis not present

## 2019-04-04 DIAGNOSIS — M62838 Other muscle spasm: Secondary | ICD-10-CM | POA: Diagnosis not present

## 2019-04-04 MED ORDER — HYDROCODONE-ACETAMINOPHEN 10-325 MG PO TABS: ORAL_TABLET | ORAL | 0 refills | Status: DC

## 2019-04-04 NOTE — Progress Notes (Signed)
Subjective:    Patient ID: Frank Larsen., male    DOB: 12/04/1960, 58 y.o.   MRN: [id number]  HPI: Frank Saladin. is a 58 y.o. male who returns for follow up appointment for chronic pain and medication refill. He reports right facial pain and lower back pain. He rates his  Pain 8. His current exercise regime is walking.   [name] was admitted to [facility] on 03/21/2019 for acute on chronic respiratory failure with hypoxia and hypercapnia. He was discharged on 03/27/2019.   [name] Morphine equivalent is 40.00 MME. Last UDS was Performed on 02/07/2019, it was consistent.     [name] in room, all questions answered.  Pain Inventory Average Pain 7 Pain Right Now 8 My pain is tingling  In the last 24 hours, has pain interfered with the following? General activity 6 Relation with others 10 Enjoyment of life 10 What TIME of day is your pain at its worst? night Sleep (in general) Good  Pain is worse with: walking Pain improves with: rest Relief from Meds: 10  Mobility walk with assistance use a walker ability to climb steps?  no do you drive?  no  Function not employed: date last employed .  Neuro/Psych trouble walking loss of taste or smell  Prior Studies Any changes since last visit?  no  Physicians involved in your care Any changes since last visit?  no   Family History  Problem Relation Age of Onset  . CAD Father   . Hypertension Mother   . Cancer Other        multiple relatives  . Diabetes Maternal Grandmother   . Diabetes Maternal Aunt   . Leukemia Cousin   . Leukemia Maternal Grandfather    Social History   Socioeconomic History  . Marital status: Married    Spouse name: Not on file  . Number of children: Not on file  . Years of education: Not on file  . Highest education level: Not on file  Occupational History  . Not on file  Social Needs  . Financial resource strain: Not on file  . Food  insecurity    Worry: Not on file    Inability: Not on file  . Transportation needs    Medical: Not on file    Non-medical: Not on file  Tobacco Use  . Smoking status: Former Smoker    Packs/day: 0.25    Years: 20.00    Pack years: 5.00    Types: Cigarettes  . Smokeless tobacco: Current User  . Tobacco comment: Rare tobacco.  "[name]".  Substance and Sexual Activity  . Alcohol use: No    Alcohol/week: 0.0 standard drinks  . Drug use: No  . Sexual activity: Yes  Lifestyle  . Physical activity    Days per week: Not on file    Minutes per session: Not on file  . Stress: Not on file  Relationships  . Social Herbalist on phone: Not on file    Gets together: Not on file    Attends religious service: Not on file    Active member of club or organization: Not on file    Attends meetings of clubs or organizations: Not on file    Relationship status: Not on file  Other Topics Concern  . Not on file  Social History Narrative  . Not on file   Past Surgical History:  Procedure Laterality  Date  . 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 ANAL FISTULECTOMY    . CARDIOVERSION N/A 05/08/2018   Procedure: CARDIOVERSION;  Surgeon: [name], MD;  Location: [facility] ENDOSCOPY;  Service: Cardiovascular;  Laterality: N/A;  . CORONARY ARTERY BYPASS GRAFT     LIMA to LAD 2009  . CRANIOTOMY Right 11/20/2012   Procedure: CRANIOTOMY REPAIR DURAL/CENTRAL SPINAL FLUID LEAK;  Surgeon: [name], MD;  Location: [place] NEURO ORS;  Service: Neurosurgery;  Laterality: Right;  . EYE SURGERY     to coorect lid and double vision  . MEDIAN RECTUS REPAIR Right 02/16/2016   Procedure: RIGHT LATERAL RECTUS RESECTION; SUPERIOR RECTUS RESECTION RIGHT EYE; RIGHT SUPERIOR RECTUS RECESSION; LATERAL TARSAL STRIP RIGHT LOWER EYELID;  Surgeon: [name], MD;   Location: [place];  Service: Ophthalmology;  Laterality: Right;  . PLACEMENT OF LUMBAR DRAIN N/A 11/20/2012   Procedure: Attempted PLACEMENT OF LUMBAR DRAIN;  Surgeon: [name], MD;  Location: [place] NEURO ORS;  Service: Neurosurgery;  Laterality: N/A;  . RETROSIGMOID CRANIECTOMY FOR TUMOR RESECTION Right 10/11/2012   Procedure: RETROSIGMOID CRANIECTOMY FOR TUMOR RESECTION;  Surgeon: [name], MD;  Location: [place] NEURO ORS;  Service: Neurosurgery;  Laterality: Right;  Craniotomy for acoustic neuroma  . TEE WITHOUT CARDIOVERSION N/A 05/08/2018   Procedure: TRANSESOPHAGEAL ECHOCARDIOGRAM (TEE);  Surgeon: [name], MD;  Location: [facility] ENDOSCOPY;  Service: Cardiovascular;  Laterality: N/A;  . TRACHEOSTOMY TUBE PLACEMENT N/A 10/11/2012   Procedure: TRACHEOSTOMY;  Surgeon: Ascencion Dike, MD;  [facility];  Service: ENT;  Laterality: N/A;  . UMBILICAL HERNIA REPAIR     Past Medical History:  Diagnosis Date  . Brain tumor ([place])    Takes Topamax so patient will not have headaches  . Bruises easily   . CAD (coronary artery disease)    Cath 09, 99% LAD  . Cardiomyopathy, ischemic    Ischemic  . CHF (congestive heart failure) ([place])   . Cyst near coccyx   . Cyst near tailbone   . Diabetes mellitus without complication (HCC)    borderline  . Edema of both legs    Takes Lasix  . Fibromyalgia   . Gout   . Headache(784.0)    with lights  . Hypertension    dr Percival Spanish  . Kidney stones   . Myocardial infarction ([place])   . Obesity   . Pneumonia    hx of  . Shortness of breath   . Stroke [facility])    Affected vision, walking, and facial drooping on right face   There were no vitals taken for this visit.  Opioid Risk Score:   Fall Risk Score:  `1  Depression screen PHQ 2/9  Depression screen [facility] 2/9 10/09/2018 09/23/2018 08/19/2018 05/31/2018 11/09/2017 09/10/2017 07/16/2017  Decreased Interest 0 0 1 1 1 1  0  Down, Depressed, Hopeless 0 0 1 1 1 1  0  PHQ - 2 Score 0 0 2 2 2 2  0   Altered sleeping - - - - - - -  Tired, decreased energy - - - - - - -  Change in appetite - - - - - - -  Feeling bad or failure about yourself  - - - - - - -  Trouble concentrating - - - - - - -  Moving slowly or fidgety/restless - - - - - - -  Suicidal thoughts - - - - - - -  PHQ-9 Score - - - - - - -     Review of Systems  Constitutional: Positive for unexpected weight change.  HENT: Negative.   Eyes: Negative.   Respiratory: Negative.   Cardiovascular: Negative.   Gastrointestinal: Negative.   Endocrine: Negative.   Genitourinary: Negative.   Musculoskeletal: Positive for back pain, gait problem and myalgias.  Skin: Negative.   Allergic/Immunologic: Negative.   Hematological: Negative.   Psychiatric/Behavioral: Negative.   All other systems reviewed and are negative.      Objective:   Physical Exam Vitals signs and nursing note reviewed.  Constitutional:      Appearance: Normal appearance.  Neck:     Musculoskeletal: Normal range of motion and neck supple.  Cardiovascular:     Rate and Rhythm: Normal rate and regular rhythm.     Pulses: Normal pulses.     Heart sounds: Normal heart sounds.  Pulmonary:     Effort: Pulmonary effort is normal.     Breath sounds: Normal breath sounds.  Musculoskeletal:     Comments: Normal Muscle Bulk and Muscle Testing Reveals:  Upper Extremities: Full ROM and Muscle Strength 5/5 Lumbar Paraspinal Tenderness: L-4-L-5  Lower Extremities: Full ROM and Muscle Strength 5/5 Arises from table slowly using walker for support Narrow Based  Gait   Skin:    General: Skin is warm and dry.  Neurological:     Mental Status: He is alert and oriented to person, place, and time.  Psychiatric:        Mood and Affect: Mood normal.        Behavior: Behavior normal.           Assessment & Plan:  1. 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 S/P RIGHT LATERAL  RECTUS RESECTION; SUPERIOR RECTUS RESECTION RIGHT EYE; RIGHT SUPERIOR RECTUS RECESSION; LATERAL TARSAL STRIP RIGHT LOWER EYELID with Dr. Frederico Hamman on 02/16/16. Refilled: Hydrocodone 10/325mg  one tablet every 6 hours as needed #120. Second script e-scribe for the following month. 08//14/2020 We will continue the opioid monitoring program, this consists of regular clinic visits, examinations, urine drug screen, pill counts as well as use of [place] Controlled Substance Reporting System. Continue eye ointment as ordered. 2. Headaches right temporal area related to tumor/surgery. No complaints today. Continue Topamax. 04/04/2019 3. Lower extremity edema, morbid obesity. Continue to Monitor. PCP Following.  04/04/2019 4. Muscle Spasm: Continue Tizanidine. 04/04/2019 5. Peripheral Neuropathy:Continue Topamax. Continue to Monitor. 04/04/2019  15 minutes of face to face patient care time was spent during this visit. All questions were encouraged and answered.    F/U in 2 months

## 2019-04-13 ENCOUNTER — Emergency Department (HOSPITAL_COMMUNITY): Payer: HMO

## 2019-04-13 ENCOUNTER — Encounter (HOSPITAL_COMMUNITY): Payer: Self-pay

## 2019-04-13 ENCOUNTER — Other Ambulatory Visit: Payer: Self-pay

## 2019-04-13 ENCOUNTER — Inpatient Hospital Stay (HOSPITAL_COMMUNITY)
Admission: EM | Admit: 2019-04-13 | Discharge: 2019-04-24 | DRG: 871 | Disposition: A | Discharge status: Home Health | Payer: HMO | Attending: Family Medicine | Admitting: Family Medicine

## 2019-04-13 DIAGNOSIS — Z87442 Personal history of urinary calculi: Secondary | ICD-10-CM

## 2019-04-13 DIAGNOSIS — Z794 Long term (current) use of insulin: Secondary | ICD-10-CM | POA: Diagnosis not present

## 2019-04-13 DIAGNOSIS — E872 Acidosis: Secondary | ICD-10-CM | POA: Diagnosis not present

## 2019-04-13 DIAGNOSIS — IMO0001 Reserved for inherently not codable concepts without codable children: Secondary | ICD-10-CM

## 2019-04-13 DIAGNOSIS — R29810 Facial weakness: Secondary | ICD-10-CM | POA: Diagnosis not present

## 2019-04-13 DIAGNOSIS — I509 Heart failure, unspecified: Secondary | ICD-10-CM | POA: Diagnosis not present

## 2019-04-13 DIAGNOSIS — E66813 Obesity, class 3: Secondary | ICD-10-CM

## 2019-04-13 DIAGNOSIS — Z20828 Contact with and (suspected) exposure to other viral communicable diseases: Secondary | ICD-10-CM | POA: Diagnosis not present

## 2019-04-13 DIAGNOSIS — J449 Chronic obstructive pulmonary disease, unspecified: Secondary | ICD-10-CM | POA: Diagnosis not present

## 2019-04-13 DIAGNOSIS — Z1624 Resistance to multiple antibiotics: Secondary | ICD-10-CM | POA: Diagnosis not present

## 2019-04-13 DIAGNOSIS — Z91041 Radiographic dye allergy status: Secondary | ICD-10-CM

## 2019-04-13 DIAGNOSIS — N179 Acute kidney failure, unspecified: Secondary | ICD-10-CM | POA: Diagnosis not present

## 2019-04-13 DIAGNOSIS — J9602 Acute respiratory failure with hypercapnia: Secondary | ICD-10-CM | POA: Diagnosis not present

## 2019-04-13 DIAGNOSIS — Z6841 Body Mass Index (BMI) 40.0 and over, adult: Secondary | ICD-10-CM

## 2019-04-13 DIAGNOSIS — I130 Hypertensive heart and chronic kidney disease with heart failure and stage 1 through stage 4 chronic kidney disease, or unspecified chronic kidney disease: Secondary | ICD-10-CM | POA: Diagnosis present

## 2019-04-13 DIAGNOSIS — E8770 Fluid overload, unspecified: Secondary | ICD-10-CM | POA: Diagnosis present

## 2019-04-13 DIAGNOSIS — N390 Urinary tract infection, site not specified: Secondary | ICD-10-CM | POA: Diagnosis present

## 2019-04-13 DIAGNOSIS — J189 Pneumonia, unspecified organism: Secondary | ICD-10-CM | POA: Diagnosis not present

## 2019-04-13 DIAGNOSIS — A419 Sepsis, unspecified organism: Principal | ICD-10-CM | POA: Diagnosis present

## 2019-04-13 DIAGNOSIS — Z4682 Encounter for fitting and adjustment of non-vascular catheter: Secondary | ICD-10-CM | POA: Diagnosis not present

## 2019-04-13 DIAGNOSIS — E1122 Type 2 diabetes mellitus with diabetic chronic kidney disease: Secondary | ICD-10-CM

## 2019-04-13 DIAGNOSIS — I809 Phlebitis and thrombophlebitis of unspecified site: Secondary | ICD-10-CM | POA: Diagnosis present

## 2019-04-13 DIAGNOSIS — R0602 Shortness of breath: Secondary | ICD-10-CM

## 2019-04-13 DIAGNOSIS — J9621 Acute and chronic respiratory failure with hypoxia: Secondary | ICD-10-CM | POA: Diagnosis not present

## 2019-04-13 DIAGNOSIS — R918 Other nonspecific abnormal finding of lung field: Secondary | ICD-10-CM | POA: Diagnosis not present

## 2019-04-13 DIAGNOSIS — I5033 Acute on chronic diastolic (congestive) heart failure: Secondary | ICD-10-CM

## 2019-04-13 DIAGNOSIS — E119 Type 2 diabetes mellitus without complications: Secondary | ICD-10-CM

## 2019-04-13 DIAGNOSIS — R0902 Hypoxemia: Secondary | ICD-10-CM | POA: Diagnosis not present

## 2019-04-13 DIAGNOSIS — J9601 Acute respiratory failure with hypoxia: Secondary | ICD-10-CM | POA: Diagnosis not present

## 2019-04-13 DIAGNOSIS — Z9119 Patient's noncompliance with other medical treatment and regimen: Secondary | ICD-10-CM | POA: Diagnosis not present

## 2019-04-13 DIAGNOSIS — I25810 Atherosclerosis of coronary artery bypass graft(s) without angina pectoris: Secondary | ICD-10-CM | POA: Diagnosis not present

## 2019-04-13 DIAGNOSIS — E871 Hypo-osmolality and hyponatremia: Secondary | ICD-10-CM | POA: Diagnosis not present

## 2019-04-13 DIAGNOSIS — J9620 Acute and chronic respiratory failure, unspecified whether with hypoxia or hypercapnia: Secondary | ICD-10-CM | POA: Diagnosis not present

## 2019-04-13 DIAGNOSIS — G510 Bell's palsy: Secondary | ICD-10-CM | POA: Diagnosis present

## 2019-04-13 DIAGNOSIS — N183 Chronic kidney disease, stage 3 unspecified: Secondary | ICD-10-CM

## 2019-04-13 DIAGNOSIS — R531 Weakness: Secondary | ICD-10-CM | POA: Diagnosis not present

## 2019-04-13 DIAGNOSIS — B964 Proteus (mirabilis) (morganii) as the cause of diseases classified elsewhere: Secondary | ICD-10-CM | POA: Diagnosis not present

## 2019-04-13 DIAGNOSIS — R410 Disorientation, unspecified: Secondary | ICD-10-CM | POA: Diagnosis not present

## 2019-04-13 DIAGNOSIS — Z8249 Family history of ischemic heart disease and other diseases of the circulatory system: Secondary | ICD-10-CM

## 2019-04-13 DIAGNOSIS — I499 Cardiac arrhythmia, unspecified: Secondary | ICD-10-CM | POA: Diagnosis not present

## 2019-04-13 DIAGNOSIS — J90 Pleural effusion, not elsewhere classified: Secondary | ICD-10-CM | POA: Diagnosis not present

## 2019-04-13 DIAGNOSIS — Z79899 Other long term (current) drug therapy: Secondary | ICD-10-CM

## 2019-04-13 DIAGNOSIS — G4733 Obstructive sleep apnea (adult) (pediatric): Secondary | ICD-10-CM

## 2019-04-13 DIAGNOSIS — J440 Chronic obstructive pulmonary disease with acute lower respiratory infection: Secondary | ICD-10-CM | POA: Diagnosis not present

## 2019-04-13 DIAGNOSIS — M797 Fibromyalgia: Secondary | ICD-10-CM | POA: Diagnosis present

## 2019-04-13 DIAGNOSIS — J441 Chronic obstructive pulmonary disease with (acute) exacerbation: Secondary | ICD-10-CM | POA: Diagnosis not present

## 2019-04-13 DIAGNOSIS — M109 Gout, unspecified: Secondary | ICD-10-CM | POA: Diagnosis present

## 2019-04-13 DIAGNOSIS — Z833 Family history of diabetes mellitus: Secondary | ICD-10-CM

## 2019-04-13 DIAGNOSIS — Z8673 Personal history of transient ischemic attack (TIA), and cerebral infarction without residual deficits: Secondary | ICD-10-CM

## 2019-04-13 DIAGNOSIS — I255 Ischemic cardiomyopathy: Secondary | ICD-10-CM | POA: Diagnosis present

## 2019-04-13 DIAGNOSIS — E1142 Type 2 diabetes mellitus with diabetic polyneuropathy: Secondary | ICD-10-CM | POA: Diagnosis present

## 2019-04-13 DIAGNOSIS — G9341 Metabolic encephalopathy: Secondary | ICD-10-CM | POA: Diagnosis present

## 2019-04-13 DIAGNOSIS — R680 Hypothermia, not associated with low environmental temperature: Secondary | ICD-10-CM | POA: Diagnosis present

## 2019-04-13 DIAGNOSIS — Z888 Allergy status to other drugs, medicaments and biological substances status: Secondary | ICD-10-CM

## 2019-04-13 DIAGNOSIS — R079 Chest pain, unspecified: Secondary | ICD-10-CM | POA: Diagnosis not present

## 2019-04-13 DIAGNOSIS — H109 Unspecified conjunctivitis: Secondary | ICD-10-CM | POA: Diagnosis present

## 2019-04-13 DIAGNOSIS — Z885 Allergy status to narcotic agent status: Secondary | ICD-10-CM

## 2019-04-13 DIAGNOSIS — G8929 Other chronic pain: Secondary | ICD-10-CM | POA: Diagnosis present

## 2019-04-13 DIAGNOSIS — Z9981 Dependence on supplemental oxygen: Secondary | ICD-10-CM

## 2019-04-13 DIAGNOSIS — G609 Hereditary and idiopathic neuropathy, unspecified: Secondary | ICD-10-CM | POA: Diagnosis present

## 2019-04-13 DIAGNOSIS — E039 Hypothyroidism, unspecified: Secondary | ICD-10-CM | POA: Diagnosis not present

## 2019-04-13 DIAGNOSIS — J9622 Acute and chronic respiratory failure with hypercapnia: Secondary | ICD-10-CM

## 2019-04-13 DIAGNOSIS — E1165 Type 2 diabetes mellitus with hyperglycemia: Secondary | ICD-10-CM | POA: Diagnosis not present

## 2019-04-13 DIAGNOSIS — R0989 Other specified symptoms and signs involving the circulatory and respiratory systems: Secondary | ICD-10-CM | POA: Diagnosis not present

## 2019-04-13 DIAGNOSIS — E1169 Type 2 diabetes mellitus with other specified complication: Secondary | ICD-10-CM

## 2019-04-13 DIAGNOSIS — R404 Transient alteration of awareness: Secondary | ICD-10-CM | POA: Diagnosis not present

## 2019-04-13 DIAGNOSIS — F17210 Nicotine dependence, cigarettes, uncomplicated: Secondary | ICD-10-CM | POA: Diagnosis present

## 2019-04-13 DIAGNOSIS — Z884 Allergy status to anesthetic agent status: Secondary | ICD-10-CM

## 2019-04-13 DIAGNOSIS — N184 Chronic kidney disease, stage 4 (severe): Secondary | ICD-10-CM | POA: Diagnosis not present

## 2019-04-13 DIAGNOSIS — Z9911 Dependence on respirator [ventilator] status: Secondary | ICD-10-CM

## 2019-04-13 DIAGNOSIS — I252 Old myocardial infarction: Secondary | ICD-10-CM

## 2019-04-13 DIAGNOSIS — I480 Paroxysmal atrial fibrillation: Secondary | ICD-10-CM | POA: Diagnosis present

## 2019-04-13 DIAGNOSIS — Z7901 Long term (current) use of anticoagulants: Secondary | ICD-10-CM

## 2019-04-13 DIAGNOSIS — J810 Acute pulmonary edema: Secondary | ICD-10-CM | POA: Diagnosis not present

## 2019-04-13 DIAGNOSIS — I2510 Atherosclerotic heart disease of native coronary artery without angina pectoris: Secondary | ICD-10-CM | POA: Diagnosis present

## 2019-04-13 DIAGNOSIS — E662 Morbid (severe) obesity with alveolar hypoventilation: Secondary | ICD-10-CM | POA: Diagnosis not present

## 2019-04-13 DIAGNOSIS — H1031 Unspecified acute conjunctivitis, right eye: Secondary | ICD-10-CM | POA: Diagnosis not present

## 2019-04-13 DIAGNOSIS — E878 Other disorders of electrolyte and fluid balance, not elsewhere classified: Secondary | ICD-10-CM | POA: Diagnosis not present

## 2019-04-13 DIAGNOSIS — E876 Hypokalemia: Secondary | ICD-10-CM | POA: Diagnosis present

## 2019-04-13 DIAGNOSIS — Z209 Contact with and (suspected) exposure to unspecified communicable disease: Secondary | ICD-10-CM | POA: Diagnosis not present

## 2019-04-13 DIAGNOSIS — G500 Trigeminal neuralgia: Secondary | ICD-10-CM | POA: Diagnosis present

## 2019-04-13 DIAGNOSIS — J984 Other disorders of lung: Secondary | ICD-10-CM | POA: Diagnosis not present

## 2019-04-13 LAB — COMPREHENSIVE METABOLIC PANEL
ALT: 27 U/L (ref 0–44)
AST: 22 U/L (ref 15–41)
Albumin: 3.4 g/dL — ABNORMAL LOW (ref 3.5–5.0)
Alkaline Phosphatase: 289 U/L — ABNORMAL HIGH (ref 38–126)
Anion gap: 10 (ref 5–15)
BUN: 33 mg/dL — ABNORMAL HIGH (ref 6–20)
CO2: 35 mmol/L — ABNORMAL HIGH (ref 22–32)
Calcium: 8.8 mg/dL — ABNORMAL LOW (ref 8.9–10.3)
Chloride: 97 mmol/L — ABNORMAL LOW (ref 98–111)
Creatinine, Ser: 2.51 mg/dL — ABNORMAL HIGH (ref 0.61–1.24)
GFR calc Af Amer: 31 mL/min — ABNORMAL LOW (ref >60)
GFR calc non Af Amer: 27 mL/min — ABNORMAL LOW (ref >60)
Glucose, Bld: 232 mg/dL — ABNORMAL HIGH (ref 70–99)
Potassium: 4.8 mmol/L (ref 3.5–5.1)
Sodium: 142 mmol/L (ref 135–145)
Total Bilirubin: 0.7 mg/dL (ref 0.3–1.2)
Total Protein: 7.7 g/dL (ref 6.5–8.1)

## 2019-04-13 LAB — POCT I-STAT 7, (LYTES, BLD GAS, ICA,H+H)
Acid-Base Excess: 10 mmol/L — ABNORMAL HIGH (ref 0.0–2.0)
Acid-Base Excess: 6 mmol/L — ABNORMAL HIGH (ref 0.0–2.0)
Bicarbonate: 37.7 mmol/L — ABNORMAL HIGH (ref 20.0–28.0)
Bicarbonate: 37.8 mmol/L — ABNORMAL HIGH (ref 20.0–28.0)
Calcium, Ion: 1.19 mmol/L (ref 1.15–1.40)
Calcium, Ion: 1.24 mmol/L (ref 1.15–1.40)
HCT: 38 % — ABNORMAL LOW (ref 39.0–52.0)
HCT: 42 % (ref 39.0–52.0)
Hemoglobin: 12.9 g/dL — ABNORMAL LOW (ref 13.0–17.0)
Hemoglobin: 14.3 g/dL (ref 13.0–17.0)
O2 Saturation: 100 %
O2 Saturation: 99 %
Patient temperature: 97.1
Patient temperature: 98.6
Potassium: 4.5 mmol/L (ref 3.5–5.1)
Potassium: 5.2 mmol/L — ABNORMAL HIGH (ref 3.5–5.1)
Sodium: 141 mmol/L (ref 135–145)
Sodium: 143 mmol/L (ref 135–145)
TCO2: 40 mmol/L — ABNORMAL HIGH (ref 22–32)
TCO2: 41 mmol/L — ABNORMAL HIGH (ref 22–32)
pCO2 arterial: 61.8 mmHg — ABNORMAL HIGH (ref 32.0–48.0)
pCO2 arterial: 92.8 mmHg (ref 32.0–48.0)
pH, Arterial: 7.212 — ABNORMAL LOW (ref 7.350–7.450)
pH, Arterial: 7.394 (ref 7.350–7.450)
pO2, Arterial: 208 mmHg — ABNORMAL HIGH (ref 83.0–108.0)
pO2, Arterial: 216 mmHg — ABNORMAL HIGH (ref 83.0–108.0)

## 2019-04-13 LAB — CBC WITH DIFFERENTIAL/PLATELET
Abs Immature Granulocytes: 0.1 10*3/uL — ABNORMAL HIGH (ref 0.00–0.07)
Basophils Absolute: 0.1 10*3/uL (ref 0.0–0.1)
Basophils Relative: 1 %
Eosinophils Absolute: 0.3 10*3/uL (ref 0.0–0.5)
Eosinophils Relative: 4 %
HCT: 42.2 % (ref 39.0–52.0)
Hemoglobin: 12 g/dL — ABNORMAL LOW (ref 13.0–17.0)
Immature Granulocytes: 1 %
Lymphocytes Relative: 11 %
Lymphs Abs: 1 10*3/uL (ref 0.7–4.0)
MCH: 29.8 pg (ref 26.0–34.0)
MCHC: 28.4 g/dL — ABNORMAL LOW (ref 30.0–36.0)
MCV: 104.7 fL — ABNORMAL HIGH (ref 80.0–100.0)
Monocytes Absolute: 0.8 10*3/uL (ref 0.1–1.0)
Monocytes Relative: 8 %
Neutro Abs: 6.8 10*3/uL (ref 1.7–7.7)
Neutrophils Relative %: 75 %
Platelets: 208 10*3/uL (ref 150–400)
RBC: 4.03 MIL/uL — ABNORMAL LOW (ref 4.22–5.81)
RDW: 13.3 % (ref 11.5–15.5)
WBC: 9.1 10*3/uL (ref 4.0–10.5)
nRBC: 0 % (ref 0.0–0.2)

## 2019-04-13 LAB — URINALYSIS, ROUTINE W REFLEX MICROSCOPIC
Bilirubin Urine: NEGATIVE
Glucose, UA: NEGATIVE mg/dL
Ketones, ur: NEGATIVE mg/dL
Nitrite: NEGATIVE
Protein, ur: NEGATIVE mg/dL
Specific Gravity, Urine: 1.01 (ref 1.005–1.030)
pH: 5 (ref 5.0–8.0)

## 2019-04-13 LAB — GLUCOSE, CAPILLARY: Glucose-Capillary: 188 mg/dL — ABNORMAL HIGH (ref 70–99)

## 2019-04-13 LAB — LACTIC ACID, PLASMA
Lactic Acid, Venous: 1.1 mmol/L (ref 0.5–1.9)
Lactic Acid, Venous: 1.2 mmol/L (ref 0.5–1.9)

## 2019-04-13 LAB — PROTIME-INR
INR: 1.3 — ABNORMAL HIGH (ref 0.8–1.2)
Prothrombin Time: 16.3 seconds — ABNORMAL HIGH (ref 11.4–15.2)

## 2019-04-13 LAB — PROCALCITONIN: Procalcitonin: 0.2 ng/mL

## 2019-04-13 LAB — SARS CORONAVIRUS 2 (TAT 6-24 HRS): SARS Coronavirus 2: NEGATIVE

## 2019-04-13 LAB — BRAIN NATRIURETIC PEPTIDE: B Natriuretic Peptide: 301.3 pg/mL — ABNORMAL HIGH (ref 0.0–100.0)

## 2019-04-13 LAB — T4, FREE: Free T4: 0.75 ng/dL (ref 0.61–1.12)

## 2019-04-13 LAB — APTT: aPTT: 43 seconds — ABNORMAL HIGH (ref 24–36)

## 2019-04-13 LAB — TSH: TSH: 0.823 u[IU]/mL (ref 0.350–4.500)

## 2019-04-13 LAB — MRSA PCR SCREENING: MRSA by PCR: NEGATIVE

## 2019-04-13 LAB — MAGNESIUM: Magnesium: 2.1 mg/dL (ref 1.7–2.4)

## 2019-04-13 LAB — PHOSPHORUS: Phosphorus: 4.1 mg/dL (ref 2.5–4.6)

## 2019-04-13 MED ORDER — FENTANYL 2500MCG IN NS 250ML (10MCG/ML) PREMIX INFUSION
25.0000 ug/h | INTRAVENOUS | Status: DC
  Administered 2019-04-13: 50 ug/h via INTRAVENOUS
  Administered 2019-04-13 – 2019-04-14 (×2): 350 ug/h via INTRAVENOUS
  Administered 2019-04-14: 200 ug/h via INTRAVENOUS
  Filled 2019-04-13 (×4): qty 250

## 2019-04-13 MED ORDER — ONDANSETRON HCL 4 MG/2ML IJ SOLN
4.0000 mg | Freq: Four times a day (QID) | INTRAMUSCULAR | Status: DC | PRN
  Administered 2019-04-21: 4 mg via INTRAVENOUS
  Filled 2019-04-13: qty 2

## 2019-04-13 MED ORDER — METOLAZONE 5 MG PO TABS
10.0000 mg | ORAL_TABLET | Freq: Once | ORAL | Status: AC
  Administered 2019-04-14: 10 mg via ORAL
  Filled 2019-04-13: qty 1
  Filled 2019-04-13: qty 2

## 2019-04-13 MED ORDER — HEPARIN (PORCINE) 25000 UT/250ML-% IV SOLN
2300.0000 [IU]/h | INTRAVENOUS | Status: DC
  Administered 2019-04-13: 1500 [IU]/h via INTRAVENOUS
  Administered 2019-04-14: 1900 [IU]/h via INTRAVENOUS
  Administered 2019-04-15 – 2019-04-16 (×3): 2100 [IU]/h via INTRAVENOUS
  Filled 2019-04-13 (×6): qty 250

## 2019-04-13 MED ORDER — SODIUM CHLORIDE 0.9 % IV SOLN
INTRAVENOUS | Status: DC | PRN
  Administered 2019-04-13 – 2019-04-15 (×2): 250 mL via INTRAVENOUS

## 2019-04-13 MED ORDER — SODIUM CHLORIDE 0.9 % IV SOLN
2.0000 g | Freq: Two times a day (BID) | INTRAVENOUS | Status: DC
  Administered 2019-04-14 – 2019-04-16 (×5): 2 g via INTRAVENOUS
  Filled 2019-04-13 (×5): qty 2

## 2019-04-13 MED ORDER — KETAMINE HCL 50 MG/5ML IJ SOSY
1.0000 mg/kg | PREFILLED_SYRINGE | Freq: Once | INTRAMUSCULAR | Status: AC
  Administered 2019-04-13: 143 mg via INTRAVENOUS
  Filled 2019-04-13: qty 15

## 2019-04-13 MED ORDER — LACTATED RINGERS IV SOLN: INTRAVENOUS | Status: DC

## 2019-04-13 MED ORDER — FUROSEMIDE 10 MG/ML IJ SOLN
80.0000 mg | Freq: Once | INTRAMUSCULAR | Status: AC
  Administered 2019-04-13: 80 mg via INTRAVENOUS
  Filled 2019-04-13: qty 8

## 2019-04-13 MED ORDER — INSULIN ASPART 100 UNIT/ML ~~LOC~~ SOLN
0.0000 [IU] | SUBCUTANEOUS | Status: DC
  Administered 2019-04-13 – 2019-04-14 (×2): 3 [IU] via SUBCUTANEOUS
  Administered 2019-04-14: 11 [IU] via SUBCUTANEOUS
  Administered 2019-04-14 (×4): 5 [IU] via SUBCUTANEOUS
  Administered 2019-04-15: 11 [IU] via SUBCUTANEOUS

## 2019-04-13 MED ORDER — HEPARIN SODIUM (PORCINE) 5000 UNIT/ML IJ SOLN: 5000.0000 [IU] | Freq: Three times a day (TID) | INTRAMUSCULAR | Status: DC

## 2019-04-13 MED ORDER — MIDAZOLAM HCL 2 MG/2ML IJ SOLN
2.0000 mg | INTRAMUSCULAR | Status: DC | PRN
  Administered 2019-04-13: 2 mg via INTRAVENOUS
  Filled 2019-04-13 (×2): qty 2

## 2019-04-13 MED ORDER — IPRATROPIUM-ALBUTEROL 0.5-2.5 (3) MG/3ML IN SOLN
3.0000 mL | Freq: Four times a day (QID) | RESPIRATORY_TRACT | Status: DC
  Administered 2019-04-14 – 2019-04-15 (×6): 3 mL via RESPIRATORY_TRACT
  Filled 2019-04-13 (×5): qty 3

## 2019-04-13 MED ORDER — LEVOTHYROXINE SODIUM 100 MCG/5ML IV SOLN: 50.0000 ug | Freq: Every day | INTRAVENOUS | Status: DC

## 2019-04-13 MED ORDER — METHYLPREDNISOLONE SODIUM SUCC 125 MG IJ SOLR
60.0000 mg | Freq: Four times a day (QID) | INTRAMUSCULAR | Status: DC
  Administered 2019-04-13 – 2019-04-15 (×7): 60 mg via INTRAVENOUS
  Filled 2019-04-13 (×7): qty 2

## 2019-04-13 MED ORDER — VANCOMYCIN HCL IN DEXTROSE 1-5 GM/200ML-% IV SOLN: 1000.0000 mg | Freq: Once | INTRAVENOUS | Status: DC

## 2019-04-13 MED ORDER — VANCOMYCIN HCL 10 G IV SOLR
1250.0000 mg | INTRAVENOUS | Status: DC
  Filled 2019-04-13: qty 1250

## 2019-04-13 MED ORDER — VANCOMYCIN HCL 10 G IV SOLR
2500.0000 mg | Freq: Once | INTRAVENOUS | Status: AC
  Administered 2019-04-13: 2500 mg via INTRAVENOUS
  Filled 2019-04-13: qty 2500

## 2019-04-13 MED ORDER — SUCCINYLCHOLINE CHLORIDE 20 MG/ML IJ SOLN
INTRAMUSCULAR | Status: AC | PRN
  Administered 2019-04-13: 150 mg via INTRAVENOUS

## 2019-04-13 MED ORDER — CARVEDILOL 12.5 MG PO TABS
12.5000 mg | ORAL_TABLET | Freq: Two times a day (BID) | ORAL | Status: DC
  Administered 2019-04-14 – 2019-04-24 (×21): 12.5 mg via ORAL
  Filled 2019-04-13 (×21): qty 1

## 2019-04-13 MED ORDER — ARFORMOTEROL TARTRATE 15 MCG/2ML IN NEBU
15.0000 ug | INHALATION_SOLUTION | Freq: Two times a day (BID) | RESPIRATORY_TRACT | Status: DC
  Administered 2019-04-14 – 2019-04-24 (×18): 15 ug via RESPIRATORY_TRACT
  Filled 2019-04-13 (×25): qty 2

## 2019-04-13 MED ORDER — MIDAZOLAM HCL 2 MG/2ML IJ SOLN: 2.0000 mg | INTRAMUSCULAR | Status: DC | PRN

## 2019-04-13 MED ORDER — ROSUVASTATIN CALCIUM 20 MG PO TABS
20.0000 mg | ORAL_TABLET | Freq: Every day | ORAL | Status: DC
  Administered 2019-04-14 – 2019-04-23 (×10): 20 mg via ORAL
  Filled 2019-04-13 (×11): qty 1

## 2019-04-13 MED ORDER — FENTANYL BOLUS VIA INFUSION
50.0000 ug | INTRAVENOUS | Status: DC | PRN
  Administered 2019-04-13 – 2019-04-15 (×4): 50 ug via INTRAVENOUS
  Filled 2019-04-13: qty 50

## 2019-04-13 MED ORDER — FUROSEMIDE 10 MG/ML IJ SOLN
100.0000 mg | INTRAVENOUS | Status: AC
  Administered 2019-04-13: 100 mg via INTRAVENOUS
  Filled 2019-04-13: qty 10

## 2019-04-13 MED ORDER — ACETAMINOPHEN 325 MG PO TABS
650.0000 mg | ORAL_TABLET | ORAL | Status: DC | PRN
  Filled 2019-04-13: qty 2

## 2019-04-13 MED ORDER — METRONIDAZOLE IN NACL 5-0.79 MG/ML-% IV SOLN
500.0000 mg | Freq: Once | INTRAVENOUS | Status: AC
  Administered 2019-04-13: 500 mg via INTRAVENOUS
  Filled 2019-04-13: qty 100

## 2019-04-13 MED ORDER — FAMOTIDINE 40 MG/5ML PO SUSR
20.0000 mg | Freq: Two times a day (BID) | ORAL | Status: DC
  Administered 2019-04-13 – 2019-04-15 (×4): 20 mg
  Filled 2019-04-13 (×4): qty 2.5

## 2019-04-13 MED ORDER — SODIUM CHLORIDE 0.9 % IV SOLN
2.0000 g | Freq: Once | INTRAVENOUS | Status: AC
  Administered 2019-04-13: 2 g via INTRAVENOUS
  Filled 2019-04-13: qty 2

## 2019-04-13 MED ORDER — INSULIN GLARGINE 100 UNIT/ML ~~LOC~~ SOLN
20.0000 [IU] | Freq: Every day | SUBCUTANEOUS | Status: DC
  Administered 2019-04-13: 20 [IU] via SUBCUTANEOUS
  Filled 2019-04-13 (×2): qty 0.2

## 2019-04-13 NOTE — H&P (Addendum)
..   NAME:  Frank Breden., MRN:  [id number], DOB:  18-Feb-1961, LOS: 0 ADMISSION DATE:  04/13/2019, CONSULTATION DATE:  04/13/2019 REFERRING MD:  Maryruth Bun MD CHIEF COMPLAINT:  SOB   Brief History   58 yr old morbidly obese M w/ PMHx sig for OHS, OSA, chronic respiratory failure with hypoxia and hypercapnia, IDDM, Afib, CAD, failed CABG in 2014, CKDstage 4, and diastolic HF presents to [facility] w/ SOB failed HFNC FiO2100% @60L  Now intubated. SARSCOV2 pending. PCCM asked to admit  History of present illness   (History obtained from account of other providers and review of the EMR)   58 yr old morbidly obese M w/ PMHx sig for OHS, OSA, chronic respiratory failure with hypoxia and hypercapnia, IDDM, Afib, CAD, failed CABG in 2014, CVA, CKD stage 3, and diastolic HF presents to [facility] w/ SOB Per the ED RN documentation family reported patient being more somnolent. He is normally active and talking but contused at baseline. Today he appeared weak. At baseline he is on 2-3L home Oxygen. Pt denied recent cough or fever. His symptoms worsened with standing and ambulation.  On presentation to ED O2 sats in low 80s end tidal CO2 77, b/l wheezing with diminished sounds. While in ED the patient was stared on 6 L, EDP states that they were unable to place the patient on BiPAP due to current restrictions under COVID pandemic. "He was hypothermic; given hypoxia and AMS, initiated code sepsis with cultures and broad-spectrum antibiotics.  Initial VBG concerning with pH 7.18, CO2 greater than 97.   Patient was started on HFNC FiO2100% @ 60L. Patient eventually decompensated and became more somnolent, unresponsive to verbal stimuli, and progressively more hypoxic and was intubated at 6:54PM w/ 7.5 mm ETT at 25. RSI 150 mg fentanyl and Succinylcholine. SARSCOV2 pending. PCCM asked to admit   Past Medical History  .Marland Kitchen Active Ambulatory Problems    Diagnosis Date Noted  . CAD (coronary artery disease) of artery  bypass graft 09/09/2012  . 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 Obesity hypoventilation syndrome ([place]) 09/09/2012  . Acoustic neuroma ([place]) 10/12/2012  . Status post craniotomy 10/12/2012  . 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 Edema 12/17/2012  . Headache due to intracranial disease 10/13/2013  . 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 Irritation of right eye 10/18/2015  . Acute on chronic respiratory failure with hypoxia and hypercapnia ([place]) 02/16/2016  . Hereditary and idiopathic peripheral neuropathy 01/08/2017  . Pressure injury of skin 01/29/2017  . Carbon dioxide retention   . Atrial fibrillation with RVR ([place])   . Acute diastolic heart failure ([place])   . Chronic diastolic heart failure ([place]) 05/20/2018  . CKD (chronic kidney disease) stage 3, GFR 30-59 ml/min (HCC) 09/14/2018  . Insulin dependent diabetes mellitus ([place]) 09/14/2018  . Acute respiratory failure with hypoxia and hypercapnia ([place]) 02/22/2019  . SOB (shortness of breath) 02/22/2019  . Acute exacerbation of chronic obstructive pulmonary disease (COPD) ([place]) 02/22/2019  . Acute on chronic diastolic CHF (congestive heart failure) ([place]) 02/23/2019  . Acute renal failure with acute renal cortical necrosis superimposed on stage 4 chronic kidney disease ([place])   . Morbid obesity with BMI of 50.0-59.9, adult ([place])   . Respiratory failure ([place]) 03/22/2019   Resolved Ambulatory Problems    Diagnosis Date Noted  . Tobacco abuse 09/09/2012  . Prerenal renal failure 10/24/2012  . Impaired fasting glucose 10/24/2012  . Acute renal insufficiency 11/12/2012  . Acute encephalopathy 01/28/2017  .  AKI (acute kidney injury) ([place])   . Acute metabolic encephalopathy 83/38/2505   Past Medical History:  Diagnosis Date  . Brain tumor ([place])   . Bruises easily   . CAD (coronary artery disease)   . Cardiomyopathy, ischemic   . CHF (congestive heart failure) ([place])   . Cyst near coccyx   . Cyst near tailbone   .  Diabetes mellitus without complication ([place])   . Edema of both legs   . Fibromyalgia   . Gout   . Headache(784.0)   . Hypertension   . Kidney stones   . Myocardial infarction ([place])   . Obesity   . Pneumonia   . Shortness of breath   . Stroke ([place])      Significant Hospital Events   04/13/2019>>Endotracheally intubated   Consults:  04/13/2019>>>PCCM  Procedures:  04/13/2019>>>>Endotracheal Intubation  Significant Diagnostic Tests:  Na+ 142 K+ 4.8 Cl- 97 CO2 35 Glucose 232 mg/dl BUN 33 Cr 2.51 AG 10 Calcium 8.8 Albumin 3.4 Alk phos 289 AST 22 ALT 27 GFR 31  BNP 301 WBC 9.1 Hgb 12 Hct 42 MCV 104 Plt 208 Diff 75 Neutrophils Lymph 11 4 eosinophil PT 16.3 INR 1.3 APTT 43 TSH 0.823  LA 1.1 ABG: pending  EKG Interpretation  Date/Time:                  Sunday April 13 2019 16:59:01 EDT Ventricular Rate:         64 PR Interval:                   QRS Duration: 107 QT Interval:                 448 QTC Calculation:        463 R Axis:                         12 0 Text Interpretation:       Sinus rhythm Prolonged PR interval Left posterior fascicular block No significant change since last tracing Confirmed by Theotis Burrow [phone]) on 04/13/2019 5:08:50 PM  CXR:   Micro Data:  04/13/2019>>>SARSCOV2 pending (Aptiva swab sent in ED)  Antimicrobials:  Vancomycin  04/13/2019 Cefepime      04/13/2019   Objective   Blood pressure (!) 115/49, pulse 68, temperature (!) 95.6 F (35.3 C), resp. rate 14, height 5' 11"  (1.803 m), SpO2 99 %.    Vent Mode: PRVC FiO2 (%):  [100 %] 100 % Set Rate:  [14 bmp] 14 bmp Vt Set:  [600 mL] 600 mL PEEP:  [10 cmH20] 10 cmH20 Plateau Pressure:  [35 cmH20] 35 cmH20   Intake/Output Summary (Last 24 hours) at 04/13/2019 1910 Last data filed at 04/13/2019 1824 Gross per 24 hour  Intake 200 ml  Output 350 ml  Net -150 ml   There were no vitals filed for this visit.  Examination: General: obese male intubated HENT: normocephalic atraumatic  ETT in oropharynx at 25 Lungs: diminished at bases, + diffuse polyphonic wheezing  Cardiovascular: S1 and S2 reg rate and rhythm Abdomen: obese soft abdomen w/o distension + BS Extremities: b/l lower ext edema Neuro: sedated Skin: dry and warm   Assessment & Plan:  1. Acute Encephalopathy Altered mental status  H/o CVA and is confused at baseline but more somnolent today Most likely secondary to hypercapnia ( ETCO2 77)  Pt has a h/o previous Stable encephalomalacia at the right CP angle and overlying craniectomy  defect. Had a [place] on 7/31 which was negative for any acute pathology Plan: Correct the metabolic derangements and improve oxygenation Neuro checks If pt demonstrates any focal deficit or acute change in neurological status>>will do CTH w/o contrast. While intubated RASS goal -1 to -2 Recommend using Fentanyl gtt w/ prn Versed  allergic to Gabapentin and QTc 463 so no Seroquel at this time  2. Acute on Chronic Respiratory failure w/ hypercapnia and hypoxia PMHx sig COPD, Obesity Hypoventilation and OSA CXR showsvascular congestion and bilateral airspace opacities BNP 301>>underestimated in obese patients Last EF 55-60% mildly dilated LV no evidence of systolic dysfunction ( ECHO was severely limited) ? Multifocal pneumonia vs AECOPD >>No cough no fevers therefore less likely Plan: Continue on full MV support TV 8 cc/kg Pulmonary hygiene Continue on full precautions (SARSCOV2 pending) Goal O2 sat 88-92% given h/o OHS F/u ABG Send RVP, Trach cultures F/u PCT Trend WBC and fever curve Continue on broad spectrum with Vancomycin and Cefepime  Will attempt diuresis Start Inhaled LABA, ICS and LAMA given h/o COPD and now intubated Allergic to prednisone?discussed with pharmacy and they will allow inhaled and systemic steroids.  3. IDDM  BG >200 No AG on BMET On Lantus 45 u QHS at home  Plan: Start on ISS Give Lantus 20 u (less than 1/2 norm dose) Keep NPO for now  except meds Check HgbA1c  4. Secondary hypothyroidism TSH 0.8 (low normal) In Sept 2019 T4 free 0.92 ( low normal) No home medications >>Reviewed with Pharmacy Plan: Check Free T4 ?central etiology Needs f/u with endocrine   5. CAD failed CABG Reviewed EKG- no acute changes concerning for ACS Plan: Glucose control w/ ISS Continue Crestor 20m Continue on cardiac monitoring  6. HTN h/o diastolic dysfunction LVEF: 55-60% Cardiogenic pulmonary edema on CXR  Plan: Continue Coreg 12.5 mg BID Start diuresis with Lasix (pt on home torsemide 610mBID) Given his kidney function he may require higher doses Monitor Is and Os for adequate response  7. Atrial Fibrillation On presentation rhythm sinus and rate controlled CHADSVASC2 >4 On Eliquis 5 mg at home and Amiodarone 10059mlan: Continue rate control with Amiodarone PO>>AM dose Would switch Eliquis to Heparin gtt  ?compliance per PMD's note on 8/12  8. Acute on Chronic Kidney Injury CKD Stage 3  GFR 31 BUN 33 Cr 2.51 Potassium wnl UA today trace leukocytes negative nitrites  WBC 6-10 decreased from previous UA on 7/31 No proteins on dipstick Plan: Unlikely an active UTI. Pt was on Levaquin recently Avoid nephrotoxins If pt continues on vancomycin he will need a vanco trough Diuresis with Lasix  Monitor Is and Os   Best practice:  Diet: NPO Pain/Anxiety/Delirium protocol (if indicated): Fentanyl gtt w/ prn versed. PO Gabapentin VAP protocol (if indicated): yes DVT prophylaxis: Heparin gtt>>h/o Afib GI prophylaxis: PPI Glucose control: ISS w/ long acting Mobility: Bedrest Q2 hr turn Code Status: FULL Family Communication: Wife RevPhoenix [phone] her at 8:29 PM and updated her of clinical condition and prognosis. We discussed patient's non-compliance to certain therapies as an outpatient. Disposition: ICU  Labs   CBC: Recent Labs  Lab 04/13/19 1623  WBC 9.1  NEUTROABS 6.8  HGB 12.0*   HCT 42.2  MCV 104.7*  PLT 208122 Basic Metabolic Panel: Recent Labs  Lab 04/13/19 1623  NA 142  K 4.8  CL 97*  CO2 35*  GLUCOSE 232*  BUN 33*  CREATININE 2.51*  CALCIUM 8.8*   GFR:  Estimated Creatinine Clearance: 46.4 mL/min (A) (by C-G formula based on SCr of 2.51 mg/dL (H)). Recent Labs  Lab 04/13/19 1623  WBC 9.1  LATICACIDVEN 1.1    Liver Function Tests: Recent Labs  Lab 04/13/19 1623  AST 22  ALT 27  ALKPHOS 289*  BILITOT 0.7  PROT 7.7  ALBUMIN 3.4*   No results for input(s): LIPASE, AMYLASE in the last 168 hours. No results for input(s): AMMONIA in the last 168 hours.  ABG    Component Value Date/Time   PHART 7.334 (L) 02/22/2019 1350   PCO2ART 67.2 (HH) 02/22/2019 1350   PO2ART 57.0 (L) 02/22/2019 1350   HCO3 37.8 (H) 03/23/2019 0803   TCO2 35 (H) 05/01/2018 0329   ACIDBASEDEF 1.0 02/16/2016 2031   O2SAT 74.5 03/23/2019 0803     Coagulation Profile: Recent Labs  Lab 04/13/19 1623  INR 1.3*    Cardiac Enzymes: No results for input(s): CKTOTAL, CKMB, CKMBINDEX, TROPONINI in the last 168 hours.  HbA1C: Hgb A1c MFr Bld  Date/Time Value Ref Range Status  03/19/2019 10:32 AM 8.8 (H) 4.6 - 6.5 % Final    Comment:    Glycemic Control Guidelines for People with Diabetes:Non Diabetic:  <6%Goal of Therapy: <7%Additional Action Suggested:  >8%   02/06/2019 10:10 AM 8.7 (H) 4.8 - 5.6 % Final    Comment:    (NOTE) Pre diabetes:          5.7%-6.4% Diabetes:              >6.4% Glycemic control for   <7.0% adults with diabetes     CBG: No results for input(s): GLUCAP in the last 168 hours.  Review of Systems:   Marland KitchenMarland KitchenReview of Systems  Unable to perform ROS: Intubated     Past Medical History  He,  has a past medical history of Brain tumor ([place]), Bruises easily, CAD (coronary artery disease), Cardiomyopathy, ischemic, CHF (congestive heart failure) ([place]), Cyst near coccyx, Cyst near tailbone, Diabetes mellitus without complication ([place]),  Edema of both legs, Fibromyalgia, Gout, Headache(784.0), Hypertension, Kidney stones, Myocardial infarction ([place]), Obesity, Pneumonia, Shortness of breath, and Stroke ([place]).   Surgical History    Past Surgical History:  Procedure Laterality Date  . 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 ANAL FISTULECTOMY    . CARDIOVERSION N/A 05/08/2018   Procedure: CARDIOVERSION;  Surgeon: [name], MD;  [facility] ENDOSCOPY;  Service: Cardiovascular;  Laterality: N/A;  . CORONARY ARTERY BYPASS GRAFT     LIMA to LAD 2009  . CRANIOTOMY Right 11/20/2012   Procedure: CRANIOTOMY REPAIR DURAL/CENTRAL SPINAL FLUID LEAK;  Surgeon: [name], MD;  Location: [place] NEURO ORS;  Service: Neurosurgery;  Laterality: Right;  . EYE SURGERY     to coorect lid and double vision  . MEDIAN RECTUS REPAIR Right 02/16/2016   Procedure: RIGHT LATERAL RECTUS RESECTION; SUPERIOR RECTUS RESECTION RIGHT EYE; RIGHT SUPERIOR RECTUS RECESSION; LATERAL TARSAL STRIP RIGHT LOWER EYELID;  Surgeon: [name], MD;  Location: [place];  Service: Ophthalmology;  Laterality: Right;  . PLACEMENT OF LUMBAR DRAIN N/A 11/20/2012   Procedure: Attempted PLACEMENT OF LUMBAR DRAIN;  Surgeon: [name], MD;  Location: [place] NEURO ORS;  Service: Neurosurgery;  Laterality: N/A;  . RETROSIGMOID CRANIECTOMY FOR TUMOR RESECTION Right 10/11/2012   Procedure: RETROSIGMOID CRANIECTOMY FOR TUMOR RESECTION;  Surgeon: [name],  MD;  Location: [place] NEURO ORS;  Service: Neurosurgery;  Laterality: Right;  Craniotomy for acoustic neuroma  . TEE WITHOUT CARDIOVERSION N/A 05/08/2018   Procedure: TRANSESOPHAGEAL ECHOCARDIOGRAM (TEE);  Surgeon: [name], MD;  Location: [facility]COPY;  Service: Cardiovascular;  Laterality: N/A;  . TRACHEOSTOMY TUBE PLACEMENT N/A 10/11/2012   Procedure: TRACHEOSTOMY;  Surgeon: Ascencion Dike, MD;   Location: [facility]UR[facility]S;  Service: ENT;  Laterality: N/A;  . UMBILICAL HERNIA REPAIR       Social History   reports that he has quit smoking. His smoking use included cigarettes. He has a 5.00 pack-year smoking history. He uses smokeless tobacco. He reports that he does not drink alcohol or use drugs.   Family History   His family history includes CAD in his father; Cancer in an other family member; Diabetes in his maternal aunt and maternal grandmother; Hypertension in his mother; Leukemia in his cousin and maternal grandfather.   Allergies Allergies  Allergen Reactions  . Morphine And Related Anaphylaxis    "makes me stop breathing"  . Novocain [Procaine] Anaphylaxis  . Carbamazepine     shaking  . Codeine Other (See Comments)    "makes heart race" per patient  . Gabapentin Other (See Comments)    Blood in urine  . Ivp Dye [Iodinated Diagnostic Agents]     Passing out  . Other     Steroids makes hearts race  . Prednisone     Heart racing     Home Medications  Prior to Admission medications   Medication Sig Start Date End Date Taking? Authorizing Provider  albuterol (VENTOLIN HFA) 108 (90 Base) MCG/ACT inhaler Inhale 2 puffs into the lungs every 6 (six) hours as needed for wheezing or shortness of breath. 03/14/19   Koberlein, Steele Berg, MD  amiodarone (PACERONE) 100 MG tablet Take 1 tablet (100 mg total) by mouth daily. 02/06/19   Clegg, Amy D, NP  apixaban (ELIQUIS) 5 MG TABS tablet Take 1 tablet (5 mg total) by mouth 2 (two) times daily. Patient taking differently: Take 5 mg by mouth daily.  02/06/19   Clegg, Amy D, NP  artificial tears (LACRILUBE) OINT ophthalmic ointment Place 1 application into both eyes at bedtime as needed for dry eyes.     [provider]  Blood Glucose Monitoring Suppl (ONE TOUCH ULTRA MINI) w/Device KIT Use as directed twice a day 12/25/18   Caren Macadam, MD  carvedilol (COREG) 12.5 MG tablet Take 1 tablet (12.5 mg total) by mouth 2 (two)  times daily with a meal. 02/06/19   Clegg, Amy D, NP  guaiFENesin (MUCINEX) 600 MG 12 hr tablet Take 2 tablets (1,200 mg total) by mouth 2 (two) times daily. 02/27/19   Florencia Reasons, MD  Hydroactive Dressings (DUODERM HYDROACTIVE) GEL duoderm or generic equivalent gel dressing. 4cmx4cm. Apply to wound q 3 days until healed. 09/13/18   Caren Macadam, MD  HYDROcodone-acetaminophen (NORCO) 10-325 MG tablet TAKE 1 TABLET BY MOUTH EVERY 6 HOURS AS NEEDED FOR PAIN MUST LAST 30 DAYS 04/04/19   Bayard Hugger, NP  hydroxypropyl methylcellulose / hypromellose (ISOPTO TEARS / GONIOVISC) 2.5 % ophthalmic solution Place 1 drop into both eyes 3 (three) times daily as needed for dry eyes.    [provider]  Insulin Pen Needle 31G X 8 MM MISC 1 Units by Does not apply route 2 (two) times a day. 02/11/19   Caren Macadam, MD  isosorbide mononitrate (IMDUR) 30 MG 24  hr tablet Take 1 tablet (30 mg total) by mouth daily. 02/06/19   Clegg, Amy D, NP  Lancets (ONETOUCH ULTRASOFT) lancets Use as instructed 12/25/18   Koberlein, Junell C, MD  LANTUS 100 UNIT/ML injection INJECT 45 UNITS INTO THE SKIN AT BEDTIME FOR 120 DOSES Patient taking differently: Inject 45 Units into the skin at bedtime.  12/13/18   Koberlein, Steele Berg, MD  nystatin (MYCOSTATIN/NYSTOP) powder APPLY  1  GRAM TOPICALLY TWICE DAILY AS NEEDED Patient taking differently: Apply 1 g topically 2 (two) times daily as needed (rash).  04/08/18   Meredith Staggers, MD  ONE TOUCH ULTRA TEST test strip Use as instructed twice a day 12/25/18   Caren Macadam, MD  potassium chloride SA (K-DUR) 20 MEQ tablet Take 2 tablets (40 mEq total) by mouth 2 (two) times daily. 02/06/19   Clegg, Amy D, NP  rosuvastatin (CRESTOR) 20 MG tablet Take 1 tablet (20 mg total) by mouth daily at 6 PM. 03/25/19 04/24/19  Elodia Florence., MD  tiZANidine (ZANAFLEX) 4 MG tablet TAKE 1 TABLET BY MOUTH FOUR TIMES DAILY AS NEEDED FOR MUSCLE SPASMS Patient taking differently: Take  4 mg by mouth 4 (four) times daily as needed for muscle spasms.  03/17/19   Meredith Staggers, MD  topiramate (TOPAMAX) 100 MG tablet Take 1 tablet (100 mg total) by mouth at bedtime. 10/09/18   Meredith Staggers, MD  torsemide (DEMADEX) 20 MG tablet TAKE 3 TABLETS (60 MG TOTAL) BY MOUTH 2 TIMES DAILY. Patient taking differently: Take 60 mg by mouth 2 (two) times daily.  02/06/19   [name], MD    I, Dr Seward Carol have personally reviewed patient's available data, including medical history, events of note, physical examination and test results as part of my evaluation. I have discussed with NP Bowser and other care providers such as pharmacist, RN and Elink.  In addition,  I personally evaluated patient The patient is critically ill with multiple organ systems failure and requires high complexity decision making for assessment and support, frequent evaluation and titration of therapies, application of advanced monitoring technologies and extensive interpretation of multiple databases.   Critical Care Time devoted to patient care services described in this note is 25 Minutes. This time reflects time of care of this signee Dr Seward Carol. This critical care time does not reflect procedure time, or teaching time or supervisory time of NP but could involve care discussion time   CC TIME:55  minutes CODE STATUS:FULL DISPOSITION: ICU PROGNOSIS:Guarded   Dr. Seward Carol Pulmonary Critical Care Medicine  04/13/2019 8:29 PM     Critical care time: 55 mins

## 2019-04-13 NOTE — ED Triage Notes (Signed)
Per [facility], pt from home w/ a c/o SOB, lethargy, and weakness. Family reports pt is normally active and talkative. He is able to ambulate on his own but today it was noted that he was more somnolent than normal and his O2 sats were in the low 80s while being on 2-3 lpm via [place]. EtCO2 77 mmHg that dropped to the teens when he stood and ambulated. Pt is normally confused at baseline. Diminished lung sounds with wheezing. Tachypneic.

## 2019-04-13 NOTE — ED Notes (Signed)
50 mcg Fentanyl rapid bolus

## 2019-04-13 NOTE — ED Notes (Signed)
50 mcg Fentanyl Rapid bolus

## 2019-04-13 NOTE — Progress Notes (Signed)
RT placed pt on HFNC 60L 100% FiO2 prior to intubation.

## 2019-04-13 NOTE — Progress Notes (Signed)
Pharmacy Antibiotic Note  [name]. is a 58 y.o. male admitted on 04/13/2019 with sepsis.  Pharmacy has been consulted for vancomycin and cefepime dosing. Pt is hypothermic and WBC is WNL. SCr is elevated but appears to be patients baseline. Lactic acid is normal.   Plan: Vancomycin 2500mg  IV x 1 then 1250mg  IV Q24H Cefepime 2gm IV Q12H F/u renal fxn, C&S, clinical status and peak/trough at [facility]    No data recorded.  Recent Labs  Lab 04/13/19 1623  WBC 9.1  CREATININE 2.51*  LATICACIDVEN 1.1    Estimated Creatinine Clearance: 45.8 mL/min (A) (by C-G formula based on SCr of 2.51 mg/dL (H)).    Allergies  Allergen Reactions  . Morphine And Related Anaphylaxis    "makes me stop breathing"  . Novocain [Procaine] Anaphylaxis  . Carbamazepine     shaking  . Codeine Other (See Comments)    "makes heart race" per patient  . Gabapentin Other (See Comments)    Blood in urine  . Ivp Dye [Iodinated Diagnostic Agents]     Passing out  . Other     Steroids makes hearts race  . Prednisone     Heart racing    Antimicrobials this admission: Vanc 8/23>> Cefepime 8/23>> Flagyl x 1 8/23  Dose adjustments this admission: N/A  Microbiology results: Pending  Thank you for allowing pharmacy to be a part of this patient's care.  Chancy Smigiel, Rande Lawman 04/13/2019 4:59 PM

## 2019-04-13 NOTE — ED Provider Notes (Signed)
[facility] EMERGENCY DEPARTMENT Provider Note   CSN: [id number] Arrival date & time: 04/13/19  1604     History   Chief Complaint Chief Complaint  Patient presents with  . Shortness of Breath  . Weakness    HPI Chevon Laufer. is a 58 y.o. male.     58yo M w/ extensive PMH including chronic respiratory failure on 3 L O2, CAD, ischemic cardiomyopathy, CHF, type 2 diabetes mellitus, obesity hypoventilation, CVA, A. fib who presents with hypoxia and weakness.  Family reported to EMS that the patient is normally active and talkative, ambulatory independently on home 3 L.  Today he has been more somnolent than normal and he reports that his breathing has been worse than usual.  His O2 saturations dropped in the 80s on his home oxygen level.  He denies any recent cough or fever.  EMS noted that his hypoxia became significantly worse when he stood and ambulated.  He has mild confusion at baseline.  He denies any complaints of pain.  LEVEL 5 CAVEAT DUE TO AMS  The history is provided by the EMS personnel. The history is limited by the condition of the patient.  Shortness of Breath Weakness Associated symptoms: shortness of breath     Past Medical History:  Diagnosis Date  . Brain tumor ([place])    Takes Topamax so patient will not have headaches  . Bruises easily   . CAD (coronary artery disease)    Cath 09, 99% LAD  . Cardiomyopathy, ischemic    Ischemic  . CHF (congestive heart failure) ([place])   . Cyst near coccyx   . Cyst near tailbone   . Diabetes mellitus without complication (HCC)    borderline  . Edema of both legs    Takes Lasix  . Fibromyalgia   . Gout   . Headache(784.0)    with lights  . Hypertension    dr Percival Spanish  . Kidney stones   . Myocardial infarction ([place])   . Obesity   . Pneumonia    hx of  . Shortness of breath   . Stroke [facility])    Affected vision, walking, and facial drooping on right face    Patient Active Problem  List   Diagnosis Date Noted  . Respiratory failure ([place]) 03/22/2019  . Morbid obesity with BMI of 50.0-59.9, adult ([place])   . Acute on chronic diastolic CHF (congestive heart failure) ([place]) 02/23/2019  . Acute renal failure with acute renal cortical necrosis superimposed on stage 4 chronic kidney disease ([place])   . Acute respiratory failure with hypoxia and hypercapnia ([place]) 02/22/2019  . SOB (shortness of breath) 02/22/2019  . Acute exacerbation of chronic obstructive pulmonary disease (COPD) ([place]) 02/22/2019  . CKD (chronic kidney disease) stage 3, GFR 30-59 ml/min (HCC) 09/14/2018  . Insulin dependent diabetes mellitus ([place]) 09/14/2018  . Chronic diastolic heart failure ([place]) 05/20/2018  . Acute diastolic heart failure ([place])   . Atrial fibrillation with RVR ([place])   . Carbon dioxide retention   . Pressure injury of skin 01/29/2017  . Hereditary and idiopathic peripheral neuropathy 01/08/2017  . Acute on chronic respiratory failure with hypoxia and hypercapnia ([place]) 02/16/2016  . Irritation of right eye 10/18/2015  . 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 Headache due to intracranial disease 10/13/2013  . Edema 12/17/2012  . 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 Acoustic neuroma ([place]) 10/12/2012  . Status post craniotomy 10/12/2012  . CAD (coronary artery disease) of artery  bypass graft 09/09/2012  . 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 Obesity hypoventilation syndrome ([place]) 09/09/2012    Past Surgical History:  Procedure Laterality Date  . 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 ANAL FISTULECTOMY    . CARDIOVERSION N/A 05/08/2018   Procedure: CARDIOVERSION;  Surgeon: [name], MD;  Location: [facility] ENDOSCOPY;  Service: Cardiovascular;  Laterality: N/A;  . CORONARY ARTERY BYPASS GRAFT     LIMA to LAD 2009  . CRANIOTOMY Right  11/20/2012   Procedure: CRANIOTOMY REPAIR DURAL/CENTRAL SPINAL FLUID LEAK;  Surgeon: [name], MD;  Location: [facility] NEURO ORS;  Service: Neurosurgery;  Laterality: Right;  . EYE SURGERY     to coorect lid and double vision  . MEDIAN RECTUS REPAIR Right 02/16/2016   Procedure: RIGHT LATERAL RECTUS RESECTION; SUPERIOR RECTUS RESECTION RIGHT EYE; RIGHT SUPERIOR RECTUS RECESSION; LATERAL TARSAL STRIP RIGHT LOWER EYELID;  Surgeon: [name], MD;  Location: [facility];  Service: Ophthalmology;  Laterality: Right;  . PLACEMENT OF LUMBAR DRAIN N/A 11/20/2012   Procedure: Attempted PLACEMENT OF LUMBAR DRAIN;  Surgeon: [name], MD;  Location: [facility] NEURO ORS;  Service: Neurosurgery;  Laterality: N/A;  . RETROSIGMOID CRANIECTOMY FOR TUMOR RESECTION Right 10/11/2012   Procedure: RETROSIGMOID CRANIECTOMY FOR TUMOR RESECTION;  Surgeon: [name], MD;  Location: [place] [facility] NEURO ORS;  Service: Neurosurgery;  Laterality: Right;  Craniotomy for acoustic neuroma  . TEE WITHOUT CARDIOVERSION N/A 05/08/2018   Procedure: TRANSESOPHAGEAL ECHOCARDIOGRAM (TEE);  Surgeon: [name], MD;  Location: [facility] ENDOSCOPY;  Service: Cardiovascular;  Laterality: N/A;  . TRACHEOSTOMY TUBE PLACEMENT N/A 10/11/2012   Procedure: TRACHEOSTOMY;  Surgeon: Ascencion Dike, MD;  Location: [facility] NEURO ORS;  Service: ENT;  Laterality: N/A;  . UMBILICAL HERNIA REPAIR          Home Medications    Prior to Admission medications   Medication Sig Start Date End Date Taking? Authorizing Provider  albuterol (VENTOLIN HFA) 108 (90 Base) MCG/ACT inhaler Inhale 2 puffs into the lungs every 6 (six) hours as needed for wheezing or shortness of breath. 03/14/19   Koberlein, Steele Berg, MD  amiodarone (PACERONE) 100 MG tablet Take 1 tablet (100 mg total) by mouth daily. 02/06/19   Clegg, Amy D, NP  apixaban (ELIQUIS) 5 MG TABS tablet Take 1 tablet (5 mg total) by mouth 2 (two) times daily. Patient taking differently: Take 5 mg by mouth daily.  02/06/19    Clegg, Amy D, NP  artificial tears (LACRILUBE) OINT ophthalmic ointment Place 1 application into both eyes at bedtime as needed for dry eyes.     [provider]  Blood Glucose Monitoring Suppl (ONE TOUCH ULTRA MINI) w/Device KIT Use as directed twice a day 12/25/18   Caren Macadam, MD  carvedilol (COREG) 12.5 MG tablet Take 1 tablet (12.5 mg total) by mouth 2 (two) times daily with a meal. 02/06/19   Clegg, Amy D, NP  guaiFENesin (MUCINEX) 600 MG 12 hr tablet Take 2 tablets (1,200 mg total) by mouth 2 (two) times daily. 02/27/19   Florencia Reasons, MD  Hydroactive Dressings (DUODERM HYDROACTIVE) GEL duoderm or generic equivalent gel dressing. 4cmx4cm. Apply to wound q 3 days until healed. 09/13/18   Koberlein, Steele Berg, MD  HYDROcodone-acetaminophen (NORCO) 10-325 MG tablet TAKE 1 TABLET BY MOUTH EVERY 6 HOURS AS NEEDED FOR  PAIN MUST LAST 30 DAYS 04/04/19   Bayard Hugger, NP  hydroxypropyl methylcellulose / hypromellose (ISOPTO TEARS / GONIOVISC) 2.5 % ophthalmic solution Place 1 drop into both eyes 3 (three) times daily as needed for dry eyes.    [provider]  Insulin Pen Needle 31G X 8 MM MISC 1 Units by Does not apply route 2 (two) times a day. 02/11/19   Caren Macadam, MD  isosorbide mononitrate (IMDUR) 30 MG 24 hr tablet Take 1 tablet (30 mg total) by mouth daily. 02/06/19   Clegg, Amy D, NP  Lancets (ONETOUCH ULTRASOFT) lancets Use as instructed 12/25/18   Koberlein, Junell C, MD  LANTUS 100 UNIT/ML injection INJECT 45 UNITS INTO THE SKIN AT BEDTIME FOR 120 DOSES Patient taking differently: Inject 45 Units into the skin at bedtime.  12/13/18   Koberlein, Steele Berg, MD  nystatin (MYCOSTATIN/NYSTOP) powder APPLY  1  GRAM TOPICALLY TWICE DAILY AS NEEDED Patient taking differently: Apply 1 g topically 2 (two) times daily as needed (rash).  04/08/18   Meredith Staggers, MD  ONE TOUCH ULTRA TEST test strip Use as instructed twice a day 12/25/18   Caren Macadam, MD  potassium  chloride SA (K-DUR) 20 MEQ tablet Take 2 tablets (40 mEq total) by mouth 2 (two) times daily. 02/06/19   Clegg, Amy D, NP  rosuvastatin (CRESTOR) 20 MG tablet Take 1 tablet (20 mg total) by mouth daily at 6 PM. 03/25/19 04/24/19  Elodia Florence., MD  tiZANidine (ZANAFLEX) 4 MG tablet TAKE 1 TABLET BY MOUTH FOUR TIMES DAILY AS NEEDED FOR MUSCLE SPASMS Patient taking differently: Take 4 mg by mouth 4 (four) times daily as needed for muscle spasms.  03/17/19   Meredith Staggers, MD  topiramate (TOPAMAX) 100 MG tablet Take 1 tablet (100 mg total) by mouth at bedtime. 10/09/18   Meredith Staggers, MD  torsemide (DEMADEX) 20 MG tablet TAKE 3 TABLETS (60 MG TOTAL) BY MOUTH 2 TIMES DAILY. Patient taking differently: Take 60 mg by mouth 2 (two) times daily.  02/06/19   [name], MD    Family History Family History  Problem Relation Age of Onset  . CAD Father   . Hypertension Mother   . Cancer Other        multiple relatives  . Diabetes Maternal Grandmother   . Diabetes Maternal Aunt   . Leukemia Cousin   . Leukemia Maternal Grandfather     Social History Social History   Tobacco Use  . Smoking status: Former Smoker    Packs/day: 0.25    Years: 20.00    Pack years: 5.00    Types: Cigarettes  . Smokeless tobacco: Current User  . Tobacco comment: Rare tobacco.  "[name]".  Substance Use Topics  . Alcohol use: No    Alcohol/week: 0.0 standard drinks  . Drug use: No     Allergies   Morphine and related, Novocain [procaine], Carbamazepine, Codeine, Gabapentin, Ivp dye [iodinated diagnostic agents], Other, and Prednisone   Review of Systems Review of Systems  Unable to perform ROS: Mental status change  Respiratory: Positive for shortness of breath.   Neurological: Positive for weakness.     Physical Exam Updated Vital Signs BP 118/60   Pulse 61   Temp (!) 93.5 F (34.2 C)   Resp 14   SpO2 96%   Physical Exam Vitals signs and nursing note reviewed.   Constitutional:  General: He is not in acute distress.    Appearance: He is obese.     Comments: Somnolent but arousable, chronically ill appearing  HENT:     Head: Normocephalic and atraumatic.  Eyes:     Conjunctiva/sclera: Conjunctivae normal.  Neck:     Musculoskeletal: Neck supple.  Cardiovascular:     Rate and Rhythm: Normal rate and regular rhythm.  Pulmonary:     Comments: Increased WOB with diminished BS b/l Abdominal:     General: Bowel sounds are normal. There is no distension.     Palpations: Abdomen is soft.     Tenderness: There is no abdominal tenderness.  Musculoskeletal:     Right lower leg: Edema present.     Left lower leg: Edema present.  Skin:    General: Skin is warm and dry.  Neurological:     Comments: Disoriented and sleepy but able to answer basic questions  Psychiatric:        Judgment: Judgment normal.      ED Treatments / Results  Labs (all labs ordered are listed, but only abnormal results are displayed) Labs Reviewed  COMPREHENSIVE METABOLIC PANEL - Abnormal; Notable for the following components:      Result Value   Chloride 97 (*)    CO2 35 (*)    Glucose, Bld 232 (*)    BUN 33 (*)    Creatinine, Ser 2.51 (*)    Calcium 8.8 (*)    Albumin 3.4 (*)    Alkaline Phosphatase 289 (*)    GFR calc non Af Amer 27 (*)    GFR calc Af Amer 31 (*)    All other components within normal limits  CBC WITH DIFFERENTIAL/PLATELET - Abnormal; Notable for the following components:   RBC 4.03 (*)    Hemoglobin 12.0 (*)    MCV 104.7 (*)    MCHC 28.4 (*)    Abs Immature Granulocytes 0.10 (*)    All other components within normal limits  APTT - Abnormal; Notable for the following components:   aPTT 43 (*)    All other components within normal limits  PROTIME-INR - Abnormal; Notable for the following components:   Prothrombin Time 16.3 (*)    INR 1.3 (*)    All other components within normal limits  URINALYSIS, ROUTINE W REFLEX MICROSCOPIC -  Abnormal; Notable for the following components:   Hgb urine dipstick SMALL (*)    Leukocytes,Ua TRACE (*)    Bacteria, UA RARE (*)    All other components within normal limits  BRAIN NATRIURETIC PEPTIDE - Abnormal; Notable for the following components:   B Natriuretic Peptide 301.3 (*)    All other components within normal limits  CULTURE, BLOOD (ROUTINE X 2)  CULTURE, BLOOD (ROUTINE X 2)  URINE CULTURE  SARS CORONAVIRUS 2  NOVEL CORONAVIRUS, NAA (HOSPITAL ORDER, SEND-OUT TO REF LAB)  LACTIC ACID, PLASMA  TSH  LACTIC ACID, PLASMA  I-STAT VENOUS BLOOD GAS, ED  I-STAT ARTERIAL BLOOD GAS, ED    EKG EKG Interpretation  Date/Time:  Sunday April 13 2019 16:59:01 EDT Ventricular Rate:  64 PR Interval:    QRS Duration: 107 QT Interval:  448 QTC Calculation: 463 R Axis:   120 Text Interpretation:  Sinus rhythm Prolonged PR interval Left posterior fascicular block No significant change since last tracing Confirmed by Theotis Burrow [phone]) on 04/13/2019 5:08:50 PM   Radiology No results found.  Procedures .Critical Care Performed by: Sharlett Iles, MD Authorized by:  Daryn Pisani, Wenda Overland, MD   Critical care provider statement:    Critical care time (minutes):  60   Critical care time was exclusive of:  Separately billable procedures and treating other patients   Critical care was necessary to treat or prevent imminent or life-threatening deterioration of the following conditions:  Respiratory failure   Critical care was time spent personally by me on the following activities:  Development of treatment plan with patient or surrogate, discussions with consultants, evaluation of patient's response to treatment, examination of patient, obtaining history from patient or surrogate, ordering and performing treatments and interventions, ordering and review of laboratory studies, ordering and review of radiographic studies, re-evaluation of patient's condition and review of old  charts   I assumed direction of critical care for this patient from another provider in my specialty: no   Procedure Name: Intubation Date/Time: 04/13/2019 6:54 PM Performed by: Candie Chroman, MD Pre-anesthesia Checklist: Emergency Drugs available, Patient identified, Patient being monitored, Timeout performed and Suction available Oxygen Delivery Method: Non-rebreather mask Preoxygenation: Pre-oxygenation with 100% oxygen Induction Type: Rapid sequence Ventilation: Mask ventilation with difficulty Laryngoscope Size: Glidescope and 4 Grade View: Grade I Tube size: 7.5 mm Number of attempts: 1 Airway Equipment and Method: Patient positioned with wedge pillow and Video-laryngoscopy Placement Confirmation: ETT inserted through vocal cords under direct vision,  Positive ETCO2,  CO2 detector and Breath sounds checked- equal and bilateral Secured at: 25 cm Tube secured with: ETT holder Future Recommendations: Recommend- induction with short-acting agent, and alternative techniques readily available      (including critical care time)  Medications Ordered in ED Medications  vancomycin (VANCOCIN) 2,500 mg in sodium chloride 0.9 % 500 mL IVPB (2,500 mg Intravenous New Bag/Given 04/13/19 1825)  furosemide (LASIX) 100 mg in dextrose 5 % 50 mL IVPB (100 mg Intravenous New Bag/Given 04/13/19 1821)  vancomycin (VANCOCIN) 1,250 mg in sodium chloride 0.9 % 250 mL IVPB (has no administration in time range)  ceFEPIme (MAXIPIME) 2 g in sodium chloride 0.9 % 100 mL IVPB (has no administration in time range)  fentaNYL 2535mg in NS 2517m(1089mml) infusion-PREMIX (200 mcg/hr Intravenous Rate/Dose Change 04/13/19 1840)  fentaNYL (SUBLIMAZE) bolus via infusion 50 mcg (has no administration in time range)  midazolam (VERSED) injection 2 mg (has no administration in time range)  midazolam (VERSED) injection 2 mg (has no administration in time range)  ceFEPIme (MAXIPIME) 2 g in sodium chloride 0.9 % 100 mL  IVPB (0 g Intravenous Stopped 04/13/19 1824)  metroNIDAZOLE (FLAGYL) IVPB 500 mg (0 mg Intravenous Stopped 04/13/19 1824)  ketamine 50 mg in normal saline 5 mL (10 mg/mL) syringe (143 mg Intravenous Given 04/13/19 1807)  succinylcholine (ANECTINE) injection (150 mg Intravenous Given 04/13/19 1810)     Initial Impression / Assessment and Plan / ED Course  I have reviewed the triage vital signs and the nursing notes.  Pertinent labs & imaging results that were available during my care of the patient were reviewed by me and considered in my medical decision making (see chart for details).       Patient was disoriented and hypoxic on initial arrival but maintaining airway and able to follow basic commands.  He was placed on 6 L, unable to place on BiPAP due to current restrictions under COVID pandemic. He was hypothermic; given hypoxia and AMS, initiated code sepsis with cultures and broad-spectrum antibiotics.  Initial VBG concerning with pH 7.18, CO2 greater than 97.  Because we are unable to place BiPAP, ordered  high flow nasal cannula.  Patient eventually decompensated and became more somnolent, unresponsive to verbal stimuli, and progressively more hypoxic.  Therefore proceeded with intubation.  See procedure note for details.  Portable chest x-ray shows pulmonary edema.  BNP 300.  Creatinine stable at baseline, 2.5 today.  Discussed with critical care, Dr. Brock Ra, and pt will be admitted for further treatment. COVID-19 test pending. Given 154m IV lasix in ED.  WJacqlyn Larsen was evaluated in Emergency Department on 04/13/2019 for the symptoms described in the history of present illness. He was evaluated in the context of the global COVID-19 pandemic, which necessitated consideration that the patient might be at risk for infection with the SARS-CoV-2 virus that causes COVID-19. Institutional protocols and algorithms that pertain to the evaluation of patients at risk for COVID-19 are in a  state of rapid change based on information released by regulatory bodies including the CDC and federal and state organizations. These policies and algorithms were followed during the patient's care in the ED.  Final Clinical Impressions(s) / ED Diagnoses   Final diagnoses:  Acute respiratory failure with hypoxia and hypercapnia ([facility]    ED Discharge Orders    None       Latayvia Mandujano, RWenda Overland MD 04/13/19 [phone]

## 2019-04-13 NOTE — Progress Notes (Signed)
Patient transported from ED Room 24 to 2V12 with no complications.

## 2019-04-13 NOTE — ED Notes (Addendum)
50 mcg Fentanyl rapid bolus

## 2019-04-13 NOTE — Progress Notes (Signed)
[facility] Progress Note Patient Name: [name]. DOB: February 26, 1961 MRN: [id number]   Date of Service  04/13/2019  HPI/Events of Note  Delirium - Request for bilateral soft wrist restraints.   eICU Interventions  Will order: 1. Bilateral soft wrist restraints.      Intervention Category Major Interventions: Delirium, psychosis, severe agitation - evaluation and management  Crescencio Jozwiak [place] 04/13/2019, 11:00 PM

## 2019-04-13 NOTE — Progress Notes (Signed)
ANTICOAGULATION CONSULT NOTE - Initial Consult  Pharmacy Consult for heparin Indication: atrial fibrillation  Allergies  Allergen Reactions  . Morphine And Related Anaphylaxis    "makes me stop breathing"  . Novocain [Procaine] Anaphylaxis  . Carbamazepine     shaking  . Codeine Other (See Comments)    "makes heart race" per patient  . Gabapentin Other (See Comments)    Blood in urine  . Ivp Dye [Iodinated Diagnostic Agents]     Passing out  . Other     Steroids makes hearts race  . Prednisone     Heart racing    Patient Measurements: Height: 5\' 11"  (180.3 cm) Weight: (!) 315 lb 0.6 oz (142.9 kg) IBW/kg (Calculated) : 75.3 Heparin Dosing Weight: 108.8kg  Vital Signs: Temp: 97.3 F (36.3 C) (08/23 2015) Temp Source: Rectal (08/23 1707) BP: 115/49 (08/23 1900) Pulse Rate: 65 (08/23 2015)  Labs: Recent Labs    04/13/19 1623 04/13/19 2015  HGB 12.0* 14.3  HCT 42.2 42.0  PLT 208  --   APTT 43*  --   LABPROT 16.3*  --   INR 1.3*  --   CREATININE 2.51*  --     Estimated Creatinine Clearance: 46.4 mL/min (A) (by C-G formula based on SCr of 2.51 mg/dL (H)).   Medical History: Past Medical History:  Diagnosis Date  . Brain tumor ([place])    Takes Topamax so patient will not have headaches  . Bruises easily   . CAD (coronary artery disease)    Cath 09, 99% LAD  . Cardiomyopathy, ischemic    Ischemic  . CHF (congestive heart failure) ([place])   . Cyst near coccyx   . Cyst near tailbone   . Diabetes mellitus without complication (HCC)    borderline  . Edema of both legs    Takes Lasix  . Fibromyalgia   . Gout   . Headache(784.0)    with lights  . Hypertension    dr Percival Spanish  . Kidney stones   . Myocardial infarction ([place])   . Obesity   . Pneumonia    hx of  . Shortness of breath   . Stroke [facility])    Affected vision, walking, and facial drooping on right face    Medications:  Infusions:  . [START ON 04/14/2019] ceFEPime (MAXIPIME) IV    . fentaNYL  infusion INTRAVENOUS 200 mcg/hr (04/13/19 1840)  . heparin    . [START ON 04/14/2019] vancomycin      Assessment: 28 yof presented to the ED with SOB and weakness. He is on apixaban PTA with his last doing this morning. Now switching to IV heparin. Hgb and platelets are WNL. No bleeding noted.   Goal of Therapy:  Heparin level 0.3-0.7 units/ml aPTT 66-103 seconds Monitor platelets by anticoagulation protocol: Yes   Plan:  Heparin gtt 1500 units/hr Check a 6 hr heparin level and aPTT Daily heparin level, CBC and aPTT  Harmoney Sienkiewicz, Rande Lawman 04/13/2019,8:40 PM

## 2019-04-14 ENCOUNTER — Other Ambulatory Visit: Payer: Self-pay

## 2019-04-14 ENCOUNTER — Inpatient Hospital Stay (HOSPITAL_COMMUNITY): Payer: HMO

## 2019-04-14 ENCOUNTER — Encounter (HOSPITAL_COMMUNITY): Payer: HMO

## 2019-04-14 DIAGNOSIS — J810 Acute pulmonary edema: Secondary | ICD-10-CM

## 2019-04-14 LAB — HEMOGLOBIN A1C
Hgb A1c MFr Bld: 7.8 % — ABNORMAL HIGH (ref 4.8–5.6)
Mean Plasma Glucose: 177.16 mg/dL

## 2019-04-14 LAB — RESPIRATORY PANEL BY PCR

## 2019-04-14 LAB — CBC
HCT: 39.7 % (ref 39.0–52.0)
Hemoglobin: 11.4 g/dL — ABNORMAL LOW (ref 13.0–17.0)
MCH: 29.2 pg (ref 26.0–34.0)
MCHC: 28.7 g/dL — ABNORMAL LOW (ref 30.0–36.0)
MCV: 101.8 fL — ABNORMAL HIGH (ref 80.0–100.0)
Platelets: 214 10*3/uL (ref 150–400)
RBC: 3.9 MIL/uL — ABNORMAL LOW (ref 4.22–5.81)
RDW: 13.3 % (ref 11.5–15.5)
WBC: 8 10*3/uL (ref 4.0–10.5)
nRBC: 0 % (ref 0.0–0.2)

## 2019-04-14 LAB — VANCOMYCIN, RANDOM: Vancomycin Rm: 19

## 2019-04-14 LAB — BASIC METABOLIC PANEL
Anion gap: 17 — ABNORMAL HIGH (ref 5–15)
BUN: 38 mg/dL — ABNORMAL HIGH (ref 6–20)
CO2: 29 mmol/L (ref 22–32)
Calcium: 9.2 mg/dL (ref 8.9–10.3)
Chloride: 98 mmol/L (ref 98–111)
Creatinine, Ser: 2.72 mg/dL — ABNORMAL HIGH (ref 0.61–1.24)
GFR calc Af Amer: 29 mL/min — ABNORMAL LOW (ref >60)
GFR calc non Af Amer: 25 mL/min — ABNORMAL LOW (ref >60)
Glucose, Bld: 228 mg/dL — ABNORMAL HIGH (ref 70–99)
Potassium: 4.4 mmol/L (ref 3.5–5.1)
Sodium: 144 mmol/L (ref 135–145)

## 2019-04-14 LAB — HEPARIN LEVEL (UNFRACTIONATED): Heparin Unfractionated: 1.06 IU/mL — ABNORMAL HIGH (ref 0.30–0.70)

## 2019-04-14 LAB — GLUCOSE, CAPILLARY
Glucose-Capillary: 184 mg/dL — ABNORMAL HIGH (ref 70–99)
Glucose-Capillary: 216 mg/dL — ABNORMAL HIGH (ref 70–99)
Glucose-Capillary: 229 mg/dL — ABNORMAL HIGH (ref 70–99)
Glucose-Capillary: 237 mg/dL — ABNORMAL HIGH (ref 70–99)
Glucose-Capillary: 243 mg/dL — ABNORMAL HIGH (ref 70–99)
Glucose-Capillary: 301 mg/dL — ABNORMAL HIGH (ref 70–99)

## 2019-04-14 LAB — APTT
aPTT: 31 seconds (ref 24–36)
aPTT: 50 seconds — ABNORMAL HIGH (ref 24–36)
aPTT: 72 seconds — ABNORMAL HIGH (ref 24–36)

## 2019-04-14 LAB — MAGNESIUM: Magnesium: 2 mg/dL (ref 1.7–2.4)

## 2019-04-14 LAB — PHOSPHORUS: Phosphorus: <1 mg/dL — CL (ref 2.5–4.6)

## 2019-04-14 MED ORDER — CHLORHEXIDINE GLUCONATE CLOTH 2 % EX PADS
6.0000 | MEDICATED_PAD | Freq: Every day | CUTANEOUS | Status: DC
  Administered 2019-04-14 – 2019-04-21 (×5): 6 via TOPICAL

## 2019-04-14 MED ORDER — INSULIN GLARGINE 100 UNIT/ML ~~LOC~~ SOLN
40.0000 [IU] | Freq: Every day | SUBCUTANEOUS | Status: DC
  Administered 2019-04-14: 40 [IU] via SUBCUTANEOUS
  Filled 2019-04-14 (×2): qty 0.4

## 2019-04-14 MED ORDER — ACETAMINOPHEN 160 MG/5ML PO SOLN
650.0000 mg | ORAL | Status: DC | PRN
  Administered 2019-04-14: 06:00:00 650 mg
  Filled 2019-04-14: qty 20.3

## 2019-04-14 MED ORDER — CHLORHEXIDINE GLUCONATE 0.12% ORAL RINSE (MEDLINE KIT)
15.0000 mL | Freq: Two times a day (BID) | OROMUCOSAL | Status: DC
  Administered 2019-04-14 – 2019-04-20 (×8): 15 mL via OROMUCOSAL

## 2019-04-14 MED ORDER — CHLORHEXIDINE GLUCONATE 0.12 % MT SOLN
OROMUCOSAL | Status: AC
  Filled 2019-04-14: qty 15

## 2019-04-14 MED ORDER — SODIUM PHOSPHATES 45 MMOLE/15ML IV SOLN
30.0000 mmol | Freq: Once | INTRAVENOUS | Status: AC
  Administered 2019-04-14: 30 mmol via INTRAVENOUS
  Filled 2019-04-14: qty 10

## 2019-04-14 MED ORDER — DEXTROSE 5 % IV SOLN
500.0000 mg | Freq: Once | INTRAVENOUS | Status: AC
  Administered 2019-04-14: 10:00:00 500 mg via INTRAVENOUS
  Filled 2019-04-14: qty 500

## 2019-04-14 MED ORDER — VITAL AF 1.2 CAL PO LIQD
1000.0000 mL | ORAL | Status: DC
  Administered 2019-04-14: 1000 mL

## 2019-04-14 MED ORDER — ORAL CARE MOUTH RINSE
15.0000 mL | OROMUCOSAL | Status: DC
  Administered 2019-04-14 – 2019-04-15 (×10): 15 mL via OROMUCOSAL

## 2019-04-14 MED ORDER — FUROSEMIDE 10 MG/ML IJ SOLN
80.0000 mg | Freq: Two times a day (BID) | INTRAMUSCULAR | Status: AC
  Administered 2019-04-14 (×2): 80 mg via INTRAVENOUS
  Filled 2019-04-14 (×2): qty 8

## 2019-04-14 MED ORDER — PRO-STAT SUGAR FREE PO LIQD
60.0000 mL | Freq: Two times a day (BID) | ORAL | Status: DC
  Administered 2019-04-14 (×2): 60 mL
  Filled 2019-04-14 (×3): qty 60

## 2019-04-14 MED ORDER — AMIODARONE HCL 200 MG PO TABS
100.0000 mg | ORAL_TABLET | Freq: Every day | ORAL | Status: DC
  Administered 2019-04-14 – 2019-04-15 (×2): 100 mg via NASOGASTRIC
  Filled 2019-04-14 (×2): qty 1

## 2019-04-14 NOTE — Progress Notes (Signed)
[place] for heparin Indication: atrial fibrillation  Allergies  Allergen Reactions  . Morphine And Related Anaphylaxis    "makes me stop breathing"  . Novocain [Procaine] Anaphylaxis  . Carbamazepine     shaking  . Codeine Other (See Comments)    "makes heart race" per patient  . Gabapentin Other (See Comments)    Blood in urine  . Ivp Dye [Iodinated Diagnostic Agents]     Passing out  . Other     Steroids makes hearts race  . Prednisone     Heart racing    Patient Measurements: Height: 5\' 11"  (180.3 cm) Weight: (!) 388 lb 7.2 oz (176.2 kg) IBW/kg (Calculated) : 75.3 Heparin Dosing Weight: 108.8kg  Vital Signs: Temp: 99 F (37.2 C) (08/24 2200) Temp Source: Bladder (08/24 2000) BP: 151/65 (08/24 2200) Pulse Rate: 60 (08/24 2200)  Labs: Recent Labs    04/13/19 1623 04/13/19 2015 04/13/19 2332 04/14/19 0437 04/14/19 1412 04/14/19 2112  HGB 12.0* 14.3 12.9* 11.4*  --   --   HCT 42.2 42.0 38.0* 39.7  --   --   PLT 208  --   --  214  --   --   APTT 43*  --   --  31 50* 72*  LABPROT 16.3*  --   --   --   --   --   INR 1.3*  --   --   --   --   --   HEPARINUNFRC  --   --   --  1.06*  --   --   CREATININE 2.51*  --   --  2.72*  --   --     Estimated Creatinine Clearance: 48.4 mL/min (A) (by C-G formula based on SCr of 2.72 mg/dL (H)).   Medical History: Past Medical History:  Diagnosis Date  . Brain tumor ([place])    Takes Topamax so patient will not have headaches  . Bruises easily   . CAD (coronary artery disease)    Cath 09, 99% LAD  . Cardiomyopathy, ischemic    Ischemic  . CHF (congestive heart failure) ([place])   . Cyst near coccyx   . Cyst near tailbone   . Diabetes mellitus without complication (HCC)    borderline  . Edema of both legs    Takes Lasix  . Fibromyalgia   . Gout   . Headache(784.0)    with lights  . Hypertension    dr Percival Spanish  . Kidney stones   . Myocardial infarction ([place])   . Obesity    . Pneumonia    hx of  . Shortness of breath   . Stroke [facility])    Affected vision, walking, and facial drooping on right face    Medications:  Infusions:  . sodium chloride 10 mL/hr at 04/14/19 2100  . ceFEPime (MAXIPIME) IV Stopped (04/14/19 1807)  . feeding supplement (VITAL AF 1.2 CAL) 20 mL/hr at 04/14/19 1800  . fentaNYL infusion INTRAVENOUS 200 mcg/hr (04/14/19 2100)  . heparin 2,100 Units/hr (04/14/19 2100)    Assessment: 24 yof presented to the ED with SOB and weakness. He is on apixaban PTA with his last doing this 8/23 AM. Now switching to IV heparin.   Aptt now at goal. No bleeding or issues with infusion per discussion with RN.  Goal of Therapy:  Heparin level 0.3-0.7 units/ml aPTT 66-103 seconds Monitor platelets by anticoagulation protocol: Yes   Plan:  Continue heparin gtt at  2100 units/hr Check a 6hr aPTT Monitor daily heparin level and CBC, s/sx bleeding  Marguerite Olea, [facility] Clinical Pharmacist Phone [phone]  04/14/2019 10:07 PM

## 2019-04-14 NOTE — Progress Notes (Signed)
[facility] Progress Note Patient Name: [name]. DOB: 30-Aug-1960 MRN: [id number]   Date of Service  04/14/2019  HPI/Events of Note  Hypophosphatemia - PO4--- < 1.0 and Creatinine = 2.72.  eICU Interventions  Will replace PO4---.      Intervention Category Major Interventions: Electrolyte abnormality - evaluation and management  Arliss Frisina Eugene 04/14/2019, 6:46 AM

## 2019-04-14 NOTE — Progress Notes (Signed)
[place] for heparin Indication: atrial fibrillation  Allergies  Allergen Reactions  . Morphine And Related Anaphylaxis    "makes me stop breathing"  . Novocain [Procaine] Anaphylaxis  . Carbamazepine     shaking  . Codeine Other (See Comments)    "makes heart race" per patient  . Gabapentin Other (See Comments)    Blood in urine  . Ivp Dye [Iodinated Diagnostic Agents]     Passing out  . Other     Steroids makes hearts race  . Prednisone     Heart racing    Patient Measurements: Height: 5\' 11"  (180.3 cm) Weight: (!) 388 lb 7.2 oz (176.2 kg) IBW/kg (Calculated) : 75.3 Heparin Dosing Weight: 108.8kg  Vital Signs: Temp: 99.7 F (37.6 C) (08/24 1500) Temp Source: Bladder (08/24 0800) BP: 156/61 (08/24 1500) Pulse Rate: 67 (08/24 1500)  Labs: Recent Labs    04/13/19 1623 04/13/19 2015 04/13/19 2332 04/14/19 0437 04/14/19 1412  HGB 12.0* 14.3 12.9* 11.4*  --   HCT 42.2 42.0 38.0* 39.7  --   PLT 208  --   --  214  --   APTT 43*  --   --  31 50*  LABPROT 16.3*  --   --   --   --   INR 1.3*  --   --   --   --   HEPARINUNFRC  --   --   --  1.06*  --   CREATININE 2.51*  --   --  2.72*  --     Estimated Creatinine Clearance: 48.4 mL/min (A) (by C-G formula based on SCr of 2.72 mg/dL (H)).   Medical History: Past Medical History:  Diagnosis Date  . Brain tumor ([place])    Takes Topamax so patient will not have headaches  . Bruises easily   . CAD (coronary artery disease)    Cath 09, 99% LAD  . Cardiomyopathy, ischemic    Ischemic  . CHF (congestive heart failure) ([place])   . Cyst near coccyx   . Cyst near tailbone   . Diabetes mellitus without complication (HCC)    borderline  . Edema of both legs    Takes Lasix  . Fibromyalgia   . Gout   . Headache(784.0)    with lights  . Hypertension    dr Percival Spanish  . Kidney stones   . Myocardial infarction ([place])   . Obesity   . Pneumonia    hx of  . Shortness of breath   .  Stroke [facility])    Affected vision, walking, and facial drooping on right face    Medications:  Infusions:  . sodium chloride 10 mL/hr at 04/14/19 1500  . ceFEPime (MAXIPIME) IV 2 g (04/14/19 0447)  . feeding supplement (VITAL AF 1.2 CAL)    . fentaNYL infusion INTRAVENOUS 200 mcg/hr (04/14/19 1500)  . heparin 2,100 Units/hr (04/14/19 1542)    Assessment: 33 yof presented to the ED with SOB and weakness. He is on apixaban PTA with his last doing this 8/23 AM. Now switching to IV heparin.   Heparin level this afternoon higher, but still below goal. Hg down to 11.4, plt WNL. No bleeding or issues with infusion per discussion with RN.  Goal of Therapy:  Heparin level 0.3-0.7 units/ml aPTT 66-103 seconds Monitor platelets by anticoagulation protocol: Yes   Plan:  Increase heparin gtt to 2100 units/hr Check a 6hr aPTT Monitor daily heparin level and CBC, s/sx bleeding  Marguerite Olea, [facility] Clinical Pharmacist Phone [phone]  04/14/2019 3:45 PM

## 2019-04-14 NOTE — Progress Notes (Signed)
[facility] for heparin Indication: atrial fibrillation  Allergies  Allergen Reactions  . Morphine And Related Anaphylaxis    "makes me stop breathing"  . Novocain [Procaine] Anaphylaxis  . Carbamazepine     shaking  . Codeine Other (See Comments)    "makes heart race" per patient  . Gabapentin Other (See Comments)    Blood in urine  . Ivp Dye [Iodinated Diagnostic Agents]     Passing out  . Other     Steroids makes hearts race  . Prednisone     Heart racing    Patient Measurements: Height: 5\' 11"  (180.3 cm) Weight: (!) 388 lb 7.2 oz (176.2 kg) IBW/kg (Calculated) : 75.3 Heparin Dosing Weight: 108.8kg  Vital Signs: Temp: 100.6 F (38.1 C) (08/24 0720) Temp Source: Bladder (08/24 0400) BP: 113/61 (08/24 0720) Pulse Rate: 70 (08/24 0720)  Labs: Recent Labs    04/13/19 1623 04/13/19 2015 04/13/19 2332 04/14/19 0437  HGB 12.0* 14.3 12.9* 11.4*  HCT 42.2 42.0 38.0* 39.7  PLT 208  --   --  214  APTT 43*  --   --  31  LABPROT 16.3*  --   --   --   INR 1.3*  --   --   --   HEPARINUNFRC  --   --   --  1.06*  CREATININE 2.51*  --   --  2.72*    Estimated Creatinine Clearance: 48.4 mL/min (A) (by C-G formula based on SCr of 2.72 mg/dL (H)).   Medical History: Past Medical History:  Diagnosis Date  . Brain tumor ([place])    Takes Topamax so patient will not have headaches  . Bruises easily   . CAD (coronary artery disease)    Cath 09, 99% LAD  . Cardiomyopathy, ischemic    Ischemic  . CHF (congestive heart failure) ([place])   . Cyst near coccyx   . Cyst near tailbone   . Diabetes mellitus without complication (HCC)    borderline  . Edema of both legs    Takes Lasix  . Fibromyalgia   . Gout   . Headache(784.0)    with lights  . Hypertension    dr Percival Spanish  . Kidney stones   . Myocardial infarction ([place])   . Obesity   . Pneumonia    hx of  . Shortness of breath   . Stroke [facility])    Affected vision, walking, and  facial drooping on right face    Medications:  Infusions:  . sodium chloride 250 mL (04/13/19 2225)  . ceFEPime (MAXIPIME) IV 2 g (04/14/19 0447)  . fentaNYL infusion INTRAVENOUS 350 mcg/hr (04/14/19 0412)  . heparin 1,500 Units/hr (04/13/19 2237)  . sodium phosphate  Dextrose 5% IVPB    . vancomycin      Assessment: 62 yof presented to the ED with SOB and weakness. He is on apixaban PTA with his last doing this 8/23 AM. Now switching to IV heparin.   Initial aPTT subtherapeutic at 31, heparin level remains elevated as expected. Hg down to 11.4, plt WNL. No bleeding or issues with infusion per discussion with RN.  Goal of Therapy:  Heparin level 0.3-0.7 units/ml aPTT 66-103 seconds Monitor platelets by anticoagulation protocol: Yes   Plan:  Increase heparin gtt to 1900 units/hr Check a 6hr aPTT Monitor daily heparin level and CBC, s/sx bleeding  Elicia Lamp, PharmD, BCPS Please check AMION for all [facility] contact numbers Clinical Pharmacist 04/14/2019 7:36 AM

## 2019-04-14 NOTE — Progress Notes (Addendum)
Per ID nurse okay to remove precautions per MD discretion. MD Nelda Marseille verbalized to discontinue precautions. Will continue to monitor.

## 2019-04-14 NOTE — Progress Notes (Signed)
Initial Nutrition Assessment  DOCUMENTATION CODES:   Morbid obesity  INTERVENTION:   Tube feeding:  -Vital AF 1.2 @ 20 ml/hr via OGT -Increase by 10 ml Q4 hours to goal rate of 55 ml/hr (1320 ml) -60 ml Prostat BID  At goal tube feeding provides: 1984 kcals, 159 grams protein, 1071 ml free water. Meets 101% kcal and 100% protein needs.   NUTRITION DIAGNOSIS:   Inadequate oral intake related to inability to eat as evidenced by NPO status.  GOAL:   Patient will meet greater than or equal to 90% of their needs  MONITOR:   Diet advancement, Vent status, Labs, I & O's, Skin, TF tolerance, Weight trends  REASON FOR ASSESSMENT:   Ventilator, Consult Enteral/tube feeding initiation and management  ASSESSMENT:   Patient with PMH significant for DM, obesity hypoventilation, OSA, CAD, CVA, failed CABG in 2014, CKD IV, CHF, and COPD. Presents this admission with acute encephalopathy likely secondary to hypercapnia.   RD unable to see pt at bedside due to airborne precautions (r/o COVID) Following commands this am. Failed SBT. Volume overloaded- continues to diurese. Generalized +2 edema noted. Phosphorus low, plan titration of feeding to goal.   Admission weight: 142.9 kg Current weight: 176.2 kg  Patient is currently intubated on ventilator support MV: 9.4 L/min Temp (24hrs), Avg:98.6 F (37 C), Min:93.3 F (34.1 C), Max:100.6 F (38.1 C)   I/O: -2,205 ml since admit UOP: 1,880 ml x 24 hrs   Drips: sodium phosphate Medications: 80 mg BID, SS novolog, lantus, solumedrol Labs: CBG 188-237 Phosphorus <1.0 (L)    Diet Order:   Diet Order            Diet NPO time specified  Diet effective now              EDUCATION NEEDS:   Not appropriate for education at this time  Skin:  Skin Assessment: Skin Integrity Issues: Skin Integrity Issues:: DTI DTI: sacrum  Last BM:  PTA  Height:   Ht Readings from Last 1 Encounters:  04/13/19 5\' 11"  (1.803 m)    Weight:    Wt Readings from Last 1 Encounters:  04/14/19 (!) 176.2 kg    Ideal Body Weight:  78.2 kg  BMI:  Body mass index is 54.18 kg/m.  Estimated Nutritional Needs:   Kcal:  1720-1955 kcal  Protein:  156-196 grams  Fluid:  >/= 1.7 L/day   Mariana Single RD, LDN Clinical Nutrition Pager # - [phone]

## 2019-04-14 NOTE — Patient Outreach (Signed)
  [facility]) Care Management Chronic Special Needs Program   04/14/2019  Name: Leshon Armistead., DOB: 08/17/1961  MRN: [id number]  The client was discussed with interdisciplinary care team. The following issues were discussed:  Client's needs, Changes in health status, Coordination of care and Care transitions   Received input from [place] (Utilization management) regarding client admission and current status. Also RNCM discussed care with Hospital Liaison, V. [place]. Hospital liaison to continue to monitor client while in the hospital. Currently client is intubated. Discussion about benefits/support for client- possible palliative care-may be an option as client's progresses.    Plan: RNCM will continue to follow as client's chronic care management coordinator.   Thea Silversmith, RN, MSN, [place] [phone]

## 2019-04-14 NOTE — Consult Note (Signed)
   [facility] CM Inpatient Consult   04/14/2019  Gerard Cantara 04/15/1961 [id number]  Patient is currently active with [facility] for chronic disease management services.  Patient has been engaged by a [place] Coordinator in the [place] who is aware of patient's admission.  Discussed with Care Coordinator patient's readmission within the past 30 days and patient's follow up and possible benefits for a palliative care consult discussed.     Our community based plan of care has focused on disease management and community resource support.    Patient will receive a post hospital call and will be evaluated for assessments and disease process education.    Plan:   Will follow up with Inpatient [place] team member to make aware that [place] Management following. Follow patient for progress and possible needs as appropriate for transition of care.    Of note, [facility] Care Management services does not replace or interfere with any services that are needed or arranged by inpatient [facility] care management team.  For additional questions or referrals please contact:  Natividad Brood, RN BSN [place] Hospital Liaison  [phone] business mobile phone Toll free office [phone]  Fax number: [phone] [email or web address]

## 2019-04-14 NOTE — Patient Outreach (Signed)
  [facility]) Care Management Chronic Special Needs Program    04/14/2019  Name: Frank Reason., DOB: June 16, 1961  MRN: [id number]   Mr. [name] is enrolled in a chronic special needs plan . Client re-admitted to the hospital on 04/13/2019 with acute respiratory failure; heart failure.  03/21/2019-03/27/2019 with acute on chronic respiratory failure, heart failure 02/22/2019-02/27/2019 with acute on chronic respiratory failure, acute on chronic diastolic heart failure  RNCM sent care plan to hospital utilization management.  Plan: RNCM will continue to follow as RNCM's chronic care management coordinator; care coordination with hospital liaison.  Thea Silversmith, RN, MSN, [place] [phone]

## 2019-04-14 NOTE — Progress Notes (Addendum)
..   NAME:  [name]., MRN:  [id number], DOB:  [birth year]LOS: 1 ADMISSION DATE:  04/13/2019, CONSULTATION DATE:  04/13/2019 REFERRING MD:  Maryruth Bun MD CHIEF COMPLAINT:  SOB   Brief History   58 yr old morbidly obese M PMHx OHS, OSA, chronic respiratory failure with hypoxia and hypercapnia, IDDM, Afib, CAD, failed CABG in 2014, CKDstage 4, and diastolic HF presents to [facility] w/ SOB failed HFNC FiO2100% @60L  Now intubated. SARSCOV2 pending. PCCM asked to admit  History of present illness   (History obtained from account of other providers and review of the EMR)   58 yr old morbidly obese M PMHx OHS, OSA, chronic respiratory failure with hypoxia and hypercapnia, IDDM, Afib, CAD, failed CABG in 2014, CVA, CKD stage 3, and diastolic HF presents to [facility] w/ SOB Per the ED RN documentation family reported patient being more somnolent. He is normally active and talking but contused at baseline but he appeared weak. At baseline he is on 2-3L home Oxygen. 58 yr old morbidly obese M PMHx OHS, OSA, chronic respiratory failure with hypoxia and hypercapnia, IDDM, Afib, CAD, failed CABG in 2014, CVA, CKD stage 3, and diastolic HF presents to [facility] w/ SOB Per the ED RN documentation family reported patient being more somnolent. He is normally active and talking but contused at baseline but he appeared weak. At baseline he is on 2-3L home Oxygen. Pt denied recent cough or fever. His symptoms worsened with standing and ambulation.  On presentation to ED O2 sats in low 80s end tidal CO2 77, b/l wheezing with diminished sounds. While in ED the patient was stared on 6 L, EDP states that they were unable to place the patient on BiPAP due to current restrictions under COVID pandemic. "He was hypothermic; given hypoxia and AMS, initiated code sepsis with cultures and broad-spectrum antibiotics. Initial VBG concerning with pH 7.18, CO2 greater than 97. Patient was started on HFNC FiO2100% @ 60L. Patient eventually decompensated and became more somnolent, unresponsive to verbal stimuli, and progressively more hypoxic and was intubated at 6:54PM w/ 7.5 mm ETT at 25. RSI 150 mg fentanyl and Succinylcholine. SARSCOV2 pending. PCCM asked to admit   Past Medical History  .Marland Kitchen Active Ambulatory Problems    Diagnosis Date Noted  . CAD (coronary artery disease) of artery bypass graft 09/09/2012  .  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 Obesity hypoventilation syndrome ([place]) 09/09/2012  . Acoustic neuroma ([place]) 10/12/2012  . Status post craniotomy 10/12/2012  . 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 Edema 12/17/2012  . Headache due to intracranial disease 10/13/2013  . 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 Irritation of right eye 10/18/2015  . Acute on chronic respiratory failure with hypoxia and hypercapnia ([place]) 02/16/2016  . Hereditary and idiopathic peripheral neuropathy 01/08/2017  . Pressure injury of skin 01/29/2017  . Carbon dioxide retention   . Atrial fibrillation with RVR ([place])   . Acute diastolic heart failure ([place])   . Chronic diastolic heart failure ([place]) 05/20/2018  . CKD stage 3 secondary to diabetes ([place]) 09/14/2018  . IDDM (insulin dependent diabetes mellitus) ([place]) 09/14/2018  . Acute respiratory failure with hypoxia and hypercapnia ([place]) 02/22/2019  . SOB (shortness of breath) 02/22/2019  . Chronic obstructive pulmonary disease ([place]) 02/22/2019  . Acute on chronic diastolic heart failure ([place]) 02/23/2019  . Acute renal failure with acute renal cortical necrosis superimposed on stage 4 chronic kidney disease ([place])   . Morbid obesity with BMI of 50.0-59.9, adult ([place])   . Respiratory failure ([place]) 03/22/2019  . OSA (obstructive sleep apnea)    Resolved Ambulatory Problems    Diagnosis Date Noted  . Tobacco abuse 09/09/2012  . Prerenal renal failure 10/24/2012  . Impaired fasting glucose 10/24/2012  . Acute renal insufficiency 11/12/2012  . Acute encephalopathy 01/28/2017  . AKI (acute kidney injury) ([place])   . Acute  metabolic encephalopathy 10/93/2355   Past Medical History:  Diagnosis Date  . Brain tumor ([place])   . Bruises easily   . CAD (coronary artery disease)   . Cardiomyopathy, ischemic   . CHF (congestive heart failure) ([place])   . Cyst near coccyx   . Cyst near tailbone   . Diabetes mellitus without complication ([name])   .  Edema of both legs   . Fibromyalgia   . Gout   . Headache(784.0)   . Hypertension   . Kidney stones   . Myocardial infarction ([place])   . Obesity   . Pneumonia   . Shortness of breath   . Stroke ([place])      Significant Hospital Events   04/13/2019>>Endotracheally intubated   Consults:  04/13/2019>>>PCCM  Procedures:  04/13/2019>>>>Endotracheal Intubation  Significant Diagnostic Tests:  Na+ 142 K+ 4.8 Cl- 97 CO2 35 Glucose 232 mg/dl BUN 33 Cr 2.51 AG 10 Calcium 8.8 Albumin 3.4 Alk phos 289 AST 22 ALT 27 GFR 31  BNP 301 WBC 9.1 Hgb 12 Hct 42 MCV 104 Plt 208 Diff 75 Neutrophils Lymph 11 4 eosinophil PT 16.3 INR 1.3 APTT 43 TSH 0.823  LA 1.1  EKG Interpretation  Date/Time:                  Sunday April 13 2019 16:59:01 EDT Ventricular Rate:         64 PR Interval:                   QRS Duration: 107 QT Interval:                 448 QTC Calculation:        463 R Axis:                         12 0 Text Interpretation:       Sinus rhythm Prolonged PR interval Left posterior fascicular block No significant change since last tracing Confirmed by Theotis Burrow [phone]) on 04/13/2019 5:08:50 PM  CXR: 8/24 B/L pulmonary infiltrates and edema, ETT in adequte position, my read  Micro Data:  04/13/2019>>SARSCOV2 aptima negative  8/23 Novel coronavirus>> MRSA surveillance negative 8/23 Urine>> 8/23 Blood>> 8/24: tracheal aspirate>> 8/24: RVP negative    Antimicrobials:  Vancomycin  8/23>>8/24 Cefepime      8/23>> Metronidazole 8/23  Interval history/Subjective  More alert on exam. Following commands.  Failed SBT   Objective   Blood pressure 131/65, pulse 73, temperature (!) 100.4 F (38 C), resp. rate (!) 21, height 5' 11"  (1.803 m), weight (!) 176.2 kg, SpO2 100 %.    Vent Mode: PRVC FiO2 (%):  [40 %-100 %] 40 % Set Rate:  [14 bmp-20 bmp] 20 bmp Vt Set:  [600 mL] 600 mL PEEP:  [5 cmH20-10 cmH20] 5 cmH20 Plateau Pressure:  [22 cmH20-35 cmH20] 22 cmH20    Intake/Output Summary (Last 24 hours) at 04/14/2019 2542 Last data filed at 04/14/2019 0800 Gross per 24 hour  Intake 1120.8 ml  Output 2155 ml  Net -1034.2 ml   Filed Weights   04/13/19 2018 04/13/19 2200 04/14/19 0142  Weight: (!) 142.9 kg (!) 176.2 kg (!) 176.2 kg    Examination: General: Obese, appears much older than chronological cage, chronically ill appearing. NAD HENT: Normocephalic, PERRL. Moist mucus membranes Neck: No JVD. Trachea midline. Thick neck girth CV: RRR. S1S2. 2/6 M, no RG. +2 radial pulses. Unale to palpate DP/PT pulses due to  edema  Lungs: BBS diminished bases, fine crackles, FNL, symmetrical. Vent supported DUK:GURKY, multiple pani, +BS x4. SNT/ND. No masses, guarding or rigidity GU: Foley EXT: MAE well. 3+ edema to mid shin Skin: PWD. In tact. No rashes or lesions Neuro: Alert to verbal. Follows commands x4.No focal deficits      Assessment & Plan:  1. Acute Encephalopathy Altered mental status  H/o CVA and is confused at baseline but more somnolent on admission Most likely secondary to hypercapnia ( ETCO2 77)  Pt has a h/o previous Stable encephalomalacia at the right CP angle and overlying craniectomy defect. Had a [place] on 7/31 which was negative for any acute pathology. More alert on exam today, follows commands Plan: Neuro checks If pt demonstrates any focal deficit or acute change in neurological status>>will do CTH w/o contrast. Change RASS goal to 0 to -1   Fentanyl gtt w/ prn Versed  allergic to Gabapentin and QTc 463 so no Seroquel at this time  2. Acute on Chronic Respiratory failure w/ hypercapnia and hypoxia PMHx sig COPD, Obesity Hypoventilation and OSA CXR continues to show vascular congestion and bilateral airspace opacities BNP 301, can be underestimated in obese patients Last EF 55-60% mildly dilated LV no evidence of systolic dysfunction ( ECHO was severely limited) ? Multifocal pneumonia vs AECOPD vs volume overload. No cough  no fevers therefore less likely PNA RVP/Trach cx negative  PCT 0.20 Plan: Continue on full MV support TV 8 cc/kg Pulmonary hygiene Continue on full precautions (final SARSCOV2 pending) Goal O2 sat 88-92% given h/o OHS Trend WBC and fever curve D/C Vancomycin as MRSA surveillance negative and continue Cefepime monotherapy for now  Aggressively diurese as he appears volume overloaded Continue Inhaled LABA, ICS and LAMA given h/o COPD and now intubated Allergic to prednisone? This was discussed with pharmacy by Dr Gilford Raid and they will allow inhaled and systemic steroids.  3. IDDM  BG >200 On Lantus 45 u QHS at home  A1c 7.8 Plan: Continue ISS Increase Lantus to 40U  Keep NPO for now except meds   4. Secondary hypothyroidism TSH 0.8 (low normal) In Sept 2019 T4 free 0.92 ( low normal) T4 Free now 0.75 No home medications >>Reviewed with Pharmacy Plan: Needs f/u with endocrine   5. CAD failed CABG Plan: Glucose control w/ ISS Continue Crestor 107m Continue on cardiac monitoring  6. HTN h/o diastolic dysfunction LVEF: 55-60% Cardiogenic pulmonary edema continues on CXR  Plan: Continue Coreg 12.5 mg BID Diurese with diuril 5027mIV x 1 dose then lasix 8050mID x 2 doses and reassess daily  Monitor I/O for adequate response  7. Atrial Fibrillation On presentation rhythm sinus and rate controlled CHADSVASC2 >4 Remains SR On Eliquis 5 mg at home and Amiodarone 100m105mO medical non-compliance per EMR review  Plan: Restart home dose amiodarone Continue Coreg  Heparin gtt for now  EKG in am to assess Qt/Qtc  8. Acute on Chronic Kidney Injury CKD Stage 3  Plan: Avoid nephrotoxins. Vanc d/ced Diuresis with Lasix/diuril as above Monitor I/O, daily weight.  9. Hypophosphatemia Plan: Replace  Follow with renal panel    Best practice:  Diet: NPO. TF pre recs  Pain/Anxiety/Delirium protocol (if indicated): Fentanyl gtt w/ prn versed. PO Gabapentin VAP  protocol (if indicated): yes DVT prophylaxis: Heparin gtt, h/o Afib GI prophylaxis: PPI Glucose control: ISS w/ long acting Mobility: Bedrest Q2 hr turn Code Status: FULL Family Communication: Updated wife RevoOwain [phone]: continue ICU  Labs and imaging from past 24 hours reviewed in EMR.     Review of Systems:   Marland KitchenMarland KitchenReview of Systems  Unable to perform ROS: Intubated       The patient is critically ill with respiratory failure. He requires ICU for high complexity decision making, titration of high alert medications, ventilator management, titration of oxygen and interpretation of advanced monitoring.    I personally spent 35 minutes providing critical care services including personally reviewing test results, discussing care with nursing staff/other physicians and completing orders pertaining to this patient.  Time was exclusive to the patient and does not include time spent teaching or in procedures.  Voice recognition software was used in the production of this record.  Errors in interpretation may have been inadvertently missed during review.  Francine Graven, MSN, AGACNP  Pager [phone] or if no answer [phone] [facility] Pulmonary & Critical Care  Attending Note:  58 year old morbidly obese male with OSA/OHS that is not compliant with CPAP.  No events overnight.  On exam, sedate, decreased BS diffusely.  I reviewed CXR myself, ETT ok and pulmonary edema noted.  Discussed with PCCM-NP.  Continue aggressive diureses today.  Hold off weaning for today.  D/C vancomycin.  Continue cefepime.  PCCM will continue manage.  Hope is to extubate in AM.  The patient is critically ill with multiple organ systems failure and requires high complexity decision making for assessment and support, frequent evaluation and titration of therapies, application of advanced monitoring technologies and extensive interpretation of multiple databases.   Critical Care  Time devoted to patient care services described in this note is  32  Minutes. This time reflects time of care of this signee Dr Jennet Maduro. This critical care time does not reflect procedure time, or teaching time or supervisory time of PA/NP/Med student/Med Resident etc but could involve care discussion time.  Rush Farmer, M.D. [facility] Pulmonary/Critical Care Medicine. Pager: [phone]. After hours pager: [phone].

## 2019-04-15 ENCOUNTER — Inpatient Hospital Stay (HOSPITAL_COMMUNITY): Payer: HMO

## 2019-04-15 DIAGNOSIS — J9601 Acute respiratory failure with hypoxia: Secondary | ICD-10-CM

## 2019-04-15 LAB — RENAL FUNCTION PANEL
Albumin: 3.2 g/dL — ABNORMAL LOW (ref 3.5–5.0)
Anion gap: 15 (ref 5–15)
BUN: 61 mg/dL — ABNORMAL HIGH (ref 6–20)
CO2: 33 mmol/L — ABNORMAL HIGH (ref 22–32)
Calcium: 9.3 mg/dL (ref 8.9–10.3)
Chloride: 93 mmol/L — ABNORMAL LOW (ref 98–111)
Creatinine, Ser: 3.32 mg/dL — ABNORMAL HIGH (ref 0.61–1.24)
GFR calc Af Amer: 22 mL/min — ABNORMAL LOW (ref >60)
GFR calc non Af Amer: 19 mL/min — ABNORMAL LOW (ref >60)
Glucose, Bld: 303 mg/dL — ABNORMAL HIGH (ref 70–99)
Phosphorus: 3 mg/dL (ref 2.5–4.6)
Potassium: 3 mmol/L — ABNORMAL LOW (ref 3.5–5.1)
Sodium: 141 mmol/L (ref 135–145)

## 2019-04-15 LAB — CBC
HCT: 39.3 % (ref 39.0–52.0)
Hemoglobin: 11.6 g/dL — ABNORMAL LOW (ref 13.0–17.0)
MCH: 29.4 pg (ref 26.0–34.0)
MCHC: 29.5 g/dL — ABNORMAL LOW (ref 30.0–36.0)
MCV: 99.5 fL (ref 80.0–100.0)
Platelets: 230 10*3/uL (ref 150–400)
RBC: 3.95 MIL/uL — ABNORMAL LOW (ref 4.22–5.81)
RDW: 13.7 % (ref 11.5–15.5)
WBC: 14.2 10*3/uL — ABNORMAL HIGH (ref 4.0–10.5)
nRBC: 0 % (ref 0.0–0.2)

## 2019-04-15 LAB — GLUCOSE, CAPILLARY
Glucose-Capillary: 243 mg/dL — ABNORMAL HIGH (ref 70–99)
Glucose-Capillary: 257 mg/dL — ABNORMAL HIGH (ref 70–99)
Glucose-Capillary: 287 mg/dL — ABNORMAL HIGH (ref 70–99)
Glucose-Capillary: 302 mg/dL — ABNORMAL HIGH (ref 70–99)
Glucose-Capillary: 307 mg/dL — ABNORMAL HIGH (ref 70–99)
Glucose-Capillary: 328 mg/dL — ABNORMAL HIGH (ref 70–99)

## 2019-04-15 LAB — APTT: aPTT: 68 seconds — ABNORMAL HIGH (ref 24–36)

## 2019-04-15 LAB — HEPARIN LEVEL (UNFRACTIONATED): Heparin Unfractionated: 0.95 IU/mL — ABNORMAL HIGH (ref 0.30–0.70)

## 2019-04-15 MED ORDER — METOLAZONE 5 MG PO TABS
10.0000 mg | ORAL_TABLET | Freq: Once | ORAL | Status: AC
  Administered 2019-04-15: 18:00:00 10 mg via ORAL
  Filled 2019-04-15: qty 2
  Filled 2019-04-15 (×2): qty 1

## 2019-04-15 MED ORDER — ORAL CARE MOUTH RINSE
15.0000 mL | Freq: Two times a day (BID) | OROMUCOSAL | Status: DC
  Administered 2019-04-15 – 2019-04-22 (×10): 15 mL via OROMUCOSAL

## 2019-04-15 MED ORDER — POTASSIUM CHLORIDE 20 MEQ/15ML (10%) PO SOLN
40.0000 meq | ORAL | Status: DC
  Administered 2019-04-15: 10:00:00 40 meq
  Filled 2019-04-15: qty 30

## 2019-04-15 MED ORDER — INSULIN GLARGINE 100 UNIT/ML ~~LOC~~ SOLN
45.0000 [IU] | Freq: Every day | SUBCUTANEOUS | Status: DC
  Administered 2019-04-15 – 2019-04-20 (×6): 45 [IU] via SUBCUTANEOUS
  Filled 2019-04-15 (×8): qty 0.45

## 2019-04-15 MED ORDER — FAMOTIDINE 40 MG/5ML PO SUSR
20.0000 mg | Freq: Two times a day (BID) | ORAL | Status: DC
  Administered 2019-04-15 – 2019-04-16 (×2): 20 mg via ORAL
  Filled 2019-04-15 (×2): qty 2.5

## 2019-04-15 MED ORDER — ISOSORBIDE MONONITRATE ER 30 MG PO TB24
30.0000 mg | ORAL_TABLET | Freq: Every day | ORAL | Status: DC
  Administered 2019-04-15: 30 mg via ORAL
  Filled 2019-04-15 (×2): qty 1

## 2019-04-15 MED ORDER — INSULIN ASPART 100 UNIT/ML ~~LOC~~ SOLN
5.0000 [IU] | SUBCUTANEOUS | Status: DC
  Administered 2019-04-15 (×3): 5 [IU] via SUBCUTANEOUS

## 2019-04-15 MED ORDER — INSULIN ASPART 100 UNIT/ML ~~LOC~~ SOLN
0.0000 [IU] | SUBCUTANEOUS | Status: DC
  Administered 2019-04-15: 20:00:00 7 [IU] via SUBCUTANEOUS
  Administered 2019-04-15: 11 [IU] via SUBCUTANEOUS
  Administered 2019-04-15: 15 [IU] via SUBCUTANEOUS
  Administered 2019-04-16 (×3): 11 [IU] via SUBCUTANEOUS
  Administered 2019-04-16: 7 [IU] via SUBCUTANEOUS

## 2019-04-15 MED ORDER — POTASSIUM CHLORIDE CRYS ER 20 MEQ PO TBCR
40.0000 meq | EXTENDED_RELEASE_TABLET | Freq: Once | ORAL | Status: AC
  Administered 2019-04-15: 40 meq via ORAL
  Filled 2019-04-15: qty 2

## 2019-04-15 MED ORDER — FUROSEMIDE 10 MG/ML IJ SOLN
60.0000 mg | Freq: Two times a day (BID) | INTRAMUSCULAR | Status: DC
  Administered 2019-04-15: 60 mg via INTRAVENOUS
  Filled 2019-04-15: qty 6

## 2019-04-15 MED ORDER — METHYLPREDNISOLONE SODIUM SUCC 125 MG IJ SOLR
40.0000 mg | Freq: Four times a day (QID) | INTRAMUSCULAR | Status: DC
  Administered 2019-04-15 – 2019-04-16 (×3): 40 mg via INTRAVENOUS
  Filled 2019-04-15 (×4): qty 2

## 2019-04-15 MED ORDER — INSULIN ASPART 100 UNIT/ML ~~LOC~~ SOLN
0.0000 [IU] | SUBCUTANEOUS | Status: DC
  Administered 2019-04-15: 11 [IU] via SUBCUTANEOUS
  Administered 2019-04-15: 8 [IU] via SUBCUTANEOUS

## 2019-04-15 MED ORDER — FUROSEMIDE 10 MG/ML IJ SOLN
40.0000 mg | Freq: Three times a day (TID) | INTRAMUSCULAR | Status: AC
  Administered 2019-04-15 – 2019-04-16 (×2): 40 mg via INTRAVENOUS
  Filled 2019-04-15 (×2): qty 4

## 2019-04-15 MED ORDER — ACETAMINOPHEN 160 MG/5ML PO SOLN: 650.0000 mg | ORAL | Status: DC | PRN

## 2019-04-15 MED ORDER — AMIODARONE HCL 200 MG PO TABS
100.0000 mg | ORAL_TABLET | Freq: Every day | ORAL | Status: DC
  Administered 2019-04-16 – 2019-04-24 (×9): 100 mg via ORAL
  Filled 2019-04-15 (×9): qty 1

## 2019-04-15 NOTE — Progress Notes (Addendum)
..   NAME:  [name]., MRN:  [id number], DOB:  [birth year]LOS: 2 ADMISSION DATE:  04/13/2019, CONSULTATION DATE:  04/13/2019 REFERRING MD:  Maryruth Bun MD CHIEF COMPLAINT:  SOB   Brief History   58 yr old morbidly obese M PMHx OHS, OSA, chronic respiratory failure with hypoxia and hypercapnia, IDDM, Afib, CAD, failed CABG in 2014, CKDstage 4, and diastolic HF presents to [facility] w/ SOB failed HFNC FiO2100% @60L  Now intubated.  History of present illness   (History obtained from account of other providers and review of the EMR) 58 yr old morbidly obese M PMHx OHS, OSA, chronic respiratory failure with hypoxia and hypercapnia, IDDM, Afib, CAD, failed CABG in 2014, CVA, CKD stage 3, and diastolic HF presents to [facility] w/ SOB Per the ED RN documentation family reported patient being more somnolent. He is normally active and talking but contused at baseline but he appeared weak. At baseline he is on 2-3L home Oxygen. Pt denied recent cough or fever. His symptoms worsened with standing and ambulation.  On presentation to ED O2 sats in low 80s end tidal CO2 77, b/l wheezing with diminished sounds. While in ED the patient was stared on 6 L, EDP states that they were unable to place the patient on BiPAP due to current restrictions under COVID pandemic. "He was hypothermic; given hypoxia and AMS, initiated code sepsis with cultures and broad-spectrum antibiotics. Initial VBG concerning with pH 7.18, CO2 greater than 97. Patient was started on HFNC FiO2100% @ 60L. Patient eventually decompensated and became more somnolent, unresponsive to verbal stimuli, and progressively more hypoxic and was intubated.   Past Medical History  .Marland Kitchen Active Ambulatory Problems    Diagnosis Date Noted  . CAD (coronary artery disease) of artery bypass graft 09/09/2012  . 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 Obesity hypoventilation syndrome ([place]) 09/09/2012  . Acoustic neuroma ([place]) 10/12/2012  . Status  post craniotomy 10/12/2012  . 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 Edema 12/17/2012  . Headache due to intracranial disease 10/13/2013  . 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 Irritation of right eye 10/18/2015  . Acute on chronic respiratory failure with hypoxia and hypercapnia ([place]) 02/16/2016  . Hereditary and idiopathic peripheral neuropathy 01/08/2017  . Pressure injury of skin 01/29/2017  . Carbon dioxide retention   . Atrial fibrillation with RVR ([place])   . Acute diastolic heart failure ([place])   . Chronic diastolic heart failure ([place]) 05/20/2018  . CKD stage 3 secondary to diabetes ([place]) 09/14/2018  . IDDM (insulin dependent diabetes mellitus) ([place]) 09/14/2018  . Acute respiratory failure with hypoxia and hypercapnia ([place]) 02/22/2019  . SOB (shortness of breath) 02/22/2019  . Chronic obstructive pulmonary disease ([place]) 02/22/2019  . Acute on chronic diastolic heart failure ([place]) 02/23/2019  . Acute renal failure with acute renal cortical necrosis superimposed on stage 4 chronic kidney disease ([place])   . Morbid obesity with BMI of 50.0-59.9, adult ([place])   . Respiratory failure ([place]) 03/22/2019  . OSA (obstructive sleep apnea)    Resolved Ambulatory Problems    Diagnosis Date Noted  . Tobacco abuse 09/09/2012  . Prerenal renal failure 10/24/2012  . Impaired fasting glucose 10/24/2012  . Acute renal insufficiency 11/12/2012  . Acute encephalopathy 01/28/2017  . AKI (acute kidney injury) ([place])   . Acute metabolic encephalopathy 42/70/6237   Past Medical History:  Diagnosis Date  . Brain tumor ([place])   . Bruises easily   . CAD (coronary artery disease)   .  Cardiomyopathy, ischemic   . CHF (congestive heart failure) ([place])   . Cyst near coccyx   . Cyst near tailbone   . Diabetes mellitus without complication ([place])   . Edema of both legs   . Fibromyalgia   . Gout   . Headache(784.0)   . Hypertension   . Kidney stones   . Myocardial infarction ([place])   . Obesity   .  Pneumonia   . Shortness of breath   . Stroke ([place])      Significant Hospital Events   8/1 > Admit with AMS, SOB > Believed secondary to volume overload and AECOPD 8/6 > Discharged, noted drug resistant UTI > [place]id[place] rettgeri  8/23 >Presents to ED, Confusion SOB, Endotracheally intubated   Consults:  04/13/2019>>>PCCM  Procedures:  04/13/2019 ETT >>   Significant Diagnostic Tests:  CT Head 7/31 > no acute CXR 8/23 > Cardiomegaly with vascular congestion and bilateral airspace opacities, likely edema/CHF. Bibasilar edema versus infection.  Micro Data:  04/13/2019 >SARSCOV2 aptima negative  MRSA surveillance > negative 8/23 Urine > 80,000 Colonies of Proteus Penneri >>  8/23 Blood>> 8/24: tracheal aspirate >> 8/24: RVP >  negative   Antimicrobials:  Vancomycin  8/23 >>8/24 Cefepime      8/23 >> Metronidazole 8/23  Interval history/Subjective  No events overnight. Currently weaning   Objective   Blood pressure (!) 145/68, pulse 72, temperature 98.6 F (37 C), resp. rate 15, height 5\' 11"  (1.803 m), weight (!) 170.7 kg, SpO2 97 %.    Vent Mode: CPAP;PSV FiO2 (%):  [40 %] 40 % Set Rate:  [14 bmp] 14 bmp Vt Set:  [600 mL] 600 mL PEEP:  [5 cmH20] 5 cmH20 Pressure Support:  [5 cmH20] 5 cmH20 Plateau Pressure:  [19 cmH20-26 cmH20] 24 cmH20   Intake/Output Summary (Last 24 hours) at 04/15/2019 0850 Last data filed at 04/15/2019 0800 Gross per 24 hour  Intake 2234.28 ml  Output 6150 ml  Net -3915.72 ml   Filed Weights   04/13/19 2200 04/14/19 0142 04/15/19 0410  Weight: (!) 176.2 kg (!) 176.2 kg (!) 170.7 kg    Examination: General: Obese, Adult male  HENT: Normocephalic, PERRL. Moist mucus membranes Thick neck girth CV: RRR. S1S2. 2/6 M, no RG. +2 radial pulses. Unale to palpate DP/PT pulses due to edema  Lungs: Diminished, distant breath sounds  GYK:ZLDJT, multiple pani, +BS x4. SNT/ND. No masses, guarding or rigidity GU: Foley EXT: +3 BLE edema  Skin:  Intact, Chronic Vascular changes to lower extremities. Neuro: Awakens to verbal commands, follows commands, pupils intact   Assessment & Plan:   Acute Encephalopathy in setting of hypercarbia vs Urosepsis  H/O CVA H/O previous Stable encephalomalacia at the right CP angle and overlying craniectomy defect. -[place] on 7/31 which was negative for any acute pathology. Plan: -Neuro checks -Change RASS goal to 0 to -1  -Fentanyl gtt w/ prn Versed  -allergic to Gabapentin and QTc 463 so no Seroquel at this time  Acute on Chronic Respiratory failure w/ hypercapnia and hypoxia in setting of multifocal PNA vs AECOPD vs Volume Overload  PMHx sig COPD, Obesity Hypoventilation and OSA CXR continues to show vascular congestion and bilateral airspace opacities PCT 0.20 Plan: -Vent Support >> Currently Weaning >> Goal for extubation today  -Titrate Supplemental Oxygen to maintain saturation > 88  -Aggressively diurese  -Continue Inhaled LABA, ICS and LAMA, Decrease Solu-Medrol  to 40 q6h  -Pulmonary hygiene  Decompensated Diastolic HF LVEF: 70-17% Cardiogenic pulmonary edema continues  on CXR  Last EF 55-60% mildly dilated LV no evidence of systolic dysfunction ( ECHO was severely limited) CAD s/p 1V CABG 2009  H/O A.Fib  Plan: -Continue Coreg 12.5 mg BID, Crestor  -Continue Diuresis : Restart home Lasix 60 BID  -Restart Imdur  -Continue amiodarone -Heparin gtt for now   Leucocytosis in setting of Multifocal PNA?? -Urine Culture with 80,000 colonies of proteus penneri >>> susceptibilities pending, previous urine culture 7/31 with resistance  -MRSA surveillance negative  Plan  -Trend WBC and fever curve -Follow Culture Data  -Continue Cefepime >> will wait for susceptibilities before changing   IDDM  A1c 7.8 Secondary hypothyroidism TSH 0.8 (low normal) In Sept 2019 T4 free 0.92 ( low normal) T4 Free now 0.75 Plan: -Continue ISS -Increase Lantus to 40U  -Keep NPO for now except  meds -Needs f/u with endocrine   Acute on Chronic Kidney Disease Stage 4 -Baseline Crt 2.3  Hypokalemia  Hypophosphatemia Plan: -Avoid nephrotoxins.  -Diuresis as above -Trend BMP -Replace electrolytes as indicated  -Monitor I/O, daily weight.  Best practice:  Diet: NPO. TF pre recs  Pain/Anxiety/Delirium protocol (if indicated): Fentanyl gtt w/ prn versed. PO Gabapentin VAP protocol (if indicated): yes DVT prophylaxis: Heparin gtt, h/o Afib GI prophylaxis: PPI Glucose control: ISS w/ long acting Mobility: Bedrest Q2 hr turn Code Status: FULL Family Communication: Will update family  Disposition: continue ICU  Labs and imaging from past 24 hours reviewed in EMR.      The patient is critically ill with respiratory failure. He requires ICU for high complexity decision making, titration of high alert medications, ventilator management, titration of oxygen and interpretation of advanced monitoring.    I personally spent 32 minutes providing critical care services including personally reviewing test results, discussing care with nursing staff/other physicians and completing orders pertaining to this patient.  Time was exclusive to the patient and does not include time spent teaching or in procedures.  Hayden Pedro, AGACNP-BC [place] Pulmonary & Critical Care  Pgr: [phone]  PCCM Pgr: [phone]  Attending Note:  58 year old male with CHF who presents to PCCM with respiratory failure due to pulmonary edema after a recent hospital discharge.  Patient diuresed well overnight but renal function deteriorated.  On exam, he is alert and interactive, moving all ext to command.  Discussed with PCCM-NP.  Will decrease diureses given renal function.  Replace electrolytes as indicated.  ISS changed and lantus added.  Proceed with extubation.  SLP and start diet.  PCCM will continue to manage.  The patient is critically ill with multiple organ systems failure and requires high  complexity decision making for assessment and support, frequent evaluation and titration of therapies, application of advanced monitoring technologies and extensive interpretation of multiple databases.   Critical Care Time devoted to patient care services described in this note is  32  Minutes. This time reflects time of care of this signee Dr Jennet Maduro. This critical care time does not reflect procedure time, or teaching time or supervisory time of PA/NP/Med student/Med Resident etc but could involve care discussion time.  Rush Farmer, M.D. [facility] Pulmonary/Critical Care Medicine. Pager: [phone]. After hours pager: [phone].

## 2019-04-15 NOTE — Progress Notes (Signed)
[place] for heparin Indication: atrial fibrillation  Allergies  Allergen Reactions  . Morphine And Related Anaphylaxis    "makes me stop breathing"  . Novocain [Procaine] Anaphylaxis  . Carbamazepine     shaking  . Codeine Other (See Comments)    "makes heart race" per patient  . Gabapentin Other (See Comments)    Blood in urine  . Ivp Dye [Iodinated Diagnostic Agents]     Passing out  . Other     Steroids makes hearts race  . Prednisone     Heart racing    Patient Measurements: Height: 5\' 11"  (180.3 cm) Weight: (!) 376 lb 5.2 oz (170.7 kg) IBW/kg (Calculated) : 75.3 Heparin Dosing Weight: 108.8kg  Vital Signs: Temp: 98.8 F (37.1 C) (08/25 0600) Temp Source: Bladder (08/25 0400) BP: 110/54 (08/25 0600) Pulse Rate: 58 (08/25 0600)  Labs: Recent Labs    04/13/19 1623  04/13/19 2332 04/14/19 0437 04/14/19 1412 04/14/19 2112 04/15/19 0313  HGB 12.0*   < > 12.9* 11.4*  --   --  11.6*  HCT 42.2   < > 38.0* 39.7  --   --  39.3  PLT 208  --   --  214  --   --  230  APTT 43*  --   --  31 50* 72* 68*  LABPROT 16.3*  --   --   --   --   --   --   INR 1.3*  --   --   --   --   --   --   HEPARINUNFRC  --   --   --  1.06*  --   --  0.95*  CREATININE 2.51*  --   --  2.72*  --   --  3.32*   < > = values in this interval not displayed.    Estimated Creatinine Clearance: 38.9 mL/min (A) (by C-G formula based on SCr of 3.32 mg/dL (H)).   Medical History: Past Medical History:  Diagnosis Date  . Brain tumor ([place])    Takes Topamax so patient will not have headaches  . Bruises easily   . CAD (coronary artery disease)    Cath 09, 99% LAD  . Cardiomyopathy, ischemic    Ischemic  . CHF (congestive heart failure) ([place])   . Cyst near coccyx   . Cyst near tailbone   . Diabetes mellitus without complication (HCC)    borderline  . Edema of both legs    Takes Lasix  . Fibromyalgia   . Gout   . Headache(784.0)    with lights  .  Hypertension    dr Percival Spanish  . Kidney stones   . Myocardial infarction ([place])   . Obesity   . Pneumonia    hx of  . Shortness of breath   . Stroke [facility])    Affected vision, walking, and facial drooping on right face    Medications:  Infusions:  . sodium chloride 10 mL/hr at 04/15/19 0600  . ceFEPime (MAXIPIME) IV Stopped (04/15/19 0557)  . feeding supplement (VITAL AF 1.2 CAL) 20 mL/hr at 04/14/19 1800  . fentaNYL infusion INTRAVENOUS 200 mcg/hr (04/15/19 0600)  . heparin 2,100 Units/hr (04/15/19 0600)    Assessment: 79 yof presented to the ED with SOB and weakness. He is on apixaban PTA with his last doing this 8/23 AM. Now switching to IV heparin.   Aptt at therapeutic (68) on 08/25 @ 0313 on heparin  rate of 2,100 units/hr. Hg/Hct are low but stable. Plts are wnls. No bleeding or issues with infusion per discussion with RN. HL and aPTT still not correlating.  Goal of Therapy:  Heparin level 0.3-0.7 units/ml aPTT 66-103 seconds Monitor platelets by anticoagulation protocol: Yes   Plan:  Continue heparin gtt at 2100 units/hr Check aPTT with morning labs until both aPTT and Heparin level correlate, then transition to monitoring only heparin levels. Monitor daily heparin level and CBC, s/sx bleeding.  Sherren Kerns, PharmD PGY1 Acute Care Pharmacy Resident [phone]  04/15/2019 6:28 AM

## 2019-04-15 NOTE — Procedures (Signed)
Extubation Procedure Note  Patient Details:   Name: [name]. DOB: 1961-02-16 MRN: [id number]   Airway Documentation:    Vent end date: 04/15/19 Vent end time: 1007   Evaluation  O2 sats: stable throughout Complications: No apparent complications Patient did tolerate procedure well. Bilateral Breath Sounds: Diminished, Other (Comment)(fine inspiratory and expiratory wheezing)   Yes   Pt extubated to 3L N/C.  No stridor noted.  RN @ bedside.  Donnetta Hail 04/15/2019, 10:07 AM

## 2019-04-15 NOTE — Progress Notes (Signed)
[facility] Progress Note Patient Name: [name]. DOB: June 02, 1961 MRN: [id number]   Date of Service  04/15/2019  HPI/Events of Note  Hyperglycemia - Blood glucose = 243 --> 301 --> 328. Currently on regimen of Lantus + Q 4 hour moderate Novolog SSI. Tube feeds started today.   eICU Interventions  Will add Novolog 5 units Sunray Q 4 hours for tube feed coverage.      Intervention Category Major Interventions: Hyperglycemia - active titration of insulin therapy  [name] [place] 04/15/2019, 1:11 AM

## 2019-04-16 ENCOUNTER — Inpatient Hospital Stay (HOSPITAL_COMMUNITY): Payer: HMO

## 2019-04-16 ENCOUNTER — Other Ambulatory Visit: Payer: Self-pay

## 2019-04-16 DIAGNOSIS — R0902 Hypoxemia: Secondary | ICD-10-CM

## 2019-04-16 LAB — CULTURE, RESPIRATORY W GRAM STAIN: Culture: NORMAL

## 2019-04-16 LAB — URINE CULTURE: Culture: 80000 — AB

## 2019-04-16 LAB — POCT I-STAT 7, (LYTES, BLD GAS, ICA,H+H)
Acid-Base Excess: 17 mmol/L — ABNORMAL HIGH (ref 0.0–2.0)
Bicarbonate: 42.6 mmol/L — ABNORMAL HIGH (ref 20.0–28.0)
Calcium, Ion: 1.09 mmol/L — ABNORMAL LOW (ref 1.15–1.40)
HCT: 41 % (ref 39.0–52.0)
Hemoglobin: 13.9 g/dL (ref 13.0–17.0)
O2 Saturation: 95 %
Patient temperature: 37.7
Potassium: 2.9 mmol/L — ABNORMAL LOW (ref 3.5–5.1)
Sodium: 140 mmol/L (ref 135–145)
TCO2: 44 mmol/L — ABNORMAL HIGH (ref 22–32)
pCO2 arterial: 54.6 mmHg — ABNORMAL HIGH (ref 32.0–48.0)
pH, Arterial: 7.503 — ABNORMAL HIGH (ref 7.350–7.450)
pO2, Arterial: 76 mmHg — ABNORMAL LOW (ref 83.0–108.0)

## 2019-04-16 LAB — CBC
HCT: 39.3 % (ref 39.0–52.0)
Hemoglobin: 12 g/dL — ABNORMAL LOW (ref 13.0–17.0)
MCH: 29.9 pg (ref 26.0–34.0)
MCHC: 30.5 g/dL (ref 30.0–36.0)
MCV: 97.8 fL (ref 80.0–100.0)
Platelets: 256 10*3/uL (ref 150–400)
RBC: 4.02 MIL/uL — ABNORMAL LOW (ref 4.22–5.81)
RDW: 13.7 % (ref 11.5–15.5)
WBC: 14.9 10*3/uL — ABNORMAL HIGH (ref 4.0–10.5)
nRBC: 0 % (ref 0.0–0.2)

## 2019-04-16 LAB — GLUCOSE, CAPILLARY
Glucose-Capillary: 227 mg/dL — ABNORMAL HIGH (ref 70–99)
Glucose-Capillary: 227 mg/dL — ABNORMAL HIGH (ref 70–99)
Glucose-Capillary: 231 mg/dL — ABNORMAL HIGH (ref 70–99)
Glucose-Capillary: 256 mg/dL — ABNORMAL HIGH (ref 70–99)
Glucose-Capillary: 262 mg/dL — ABNORMAL HIGH (ref 70–99)
Glucose-Capillary: 271 mg/dL — ABNORMAL HIGH (ref 70–99)
Glucose-Capillary: 284 mg/dL — ABNORMAL HIGH (ref 70–99)

## 2019-04-16 LAB — APTT: aPTT: 59 seconds — ABNORMAL HIGH (ref 24–36)

## 2019-04-16 LAB — RENAL FUNCTION PANEL
Albumin: 3.2 g/dL — ABNORMAL LOW (ref 3.5–5.0)
Anion gap: 18 — ABNORMAL HIGH (ref 5–15)
BUN: 81 mg/dL — ABNORMAL HIGH (ref 6–20)
CO2: 36 mmol/L — ABNORMAL HIGH (ref 22–32)
Calcium: 8.9 mg/dL (ref 8.9–10.3)
Chloride: 87 mmol/L — ABNORMAL LOW (ref 98–111)
Creatinine, Ser: 3.5 mg/dL — ABNORMAL HIGH (ref 0.61–1.24)
GFR calc Af Amer: 21 mL/min — ABNORMAL LOW (ref >60)
GFR calc non Af Amer: 18 mL/min — ABNORMAL LOW (ref >60)
Glucose, Bld: 273 mg/dL — ABNORMAL HIGH (ref 70–99)
Phosphorus: 4.1 mg/dL (ref 2.5–4.6)
Potassium: 2.8 mmol/L — ABNORMAL LOW (ref 3.5–5.1)
Sodium: 141 mmol/L (ref 135–145)

## 2019-04-16 LAB — HEPARIN LEVEL (UNFRACTIONATED): Heparin Unfractionated: 1.02 IU/mL — ABNORMAL HIGH (ref 0.30–0.70)

## 2019-04-16 LAB — MAGNESIUM: Magnesium: 2.3 mg/dL (ref 1.7–2.4)

## 2019-04-16 LAB — PHOSPHORUS: Phosphorus: 4 mg/dL (ref 2.5–4.6)

## 2019-04-16 MED ORDER — ISOSORBIDE MONONITRATE ER 60 MG PO TB24
60.0000 mg | ORAL_TABLET | Freq: Every day | ORAL | Status: DC
  Administered 2019-04-16 – 2019-04-24 (×9): 60 mg via ORAL
  Filled 2019-04-16 (×9): qty 1

## 2019-04-16 MED ORDER — POTASSIUM CHLORIDE CRYS ER 20 MEQ PO TBCR
40.0000 meq | EXTENDED_RELEASE_TABLET | Freq: Once | ORAL | Status: AC
  Administered 2019-04-16: 40 meq via ORAL
  Filled 2019-04-16: qty 2

## 2019-04-16 MED ORDER — INSULIN ASPART 100 UNIT/ML ~~LOC~~ SOLN
0.0000 [IU] | Freq: Three times a day (TID) | SUBCUTANEOUS | Status: DC
  Administered 2019-04-16: 11 [IU] via SUBCUTANEOUS
  Administered 2019-04-17 (×3): 7 [IU] via SUBCUTANEOUS
  Administered 2019-04-18: 4 [IU] via SUBCUTANEOUS
  Administered 2019-04-18: 11 [IU] via SUBCUTANEOUS
  Administered 2019-04-18: 12 [IU] via SUBCUTANEOUS
  Administered 2019-04-19 (×2): 7 [IU] via SUBCUTANEOUS
  Administered 2019-04-19: 15 [IU] via SUBCUTANEOUS
  Administered 2019-04-20: 11 [IU] via SUBCUTANEOUS
  Administered 2019-04-20: 15 [IU] via SUBCUTANEOUS
  Administered 2019-04-20: 11 [IU] via SUBCUTANEOUS
  Administered 2019-04-21: 7 [IU] via SUBCUTANEOUS
  Administered 2019-04-21: 4 [IU] via SUBCUTANEOUS
  Administered 2019-04-21 – 2019-04-22 (×2): 15 [IU] via SUBCUTANEOUS
  Administered 2019-04-22 – 2019-04-23 (×3): 7 [IU] via SUBCUTANEOUS
  Administered 2019-04-23: 11 [IU] via SUBCUTANEOUS
  Administered 2019-04-23: 4 [IU] via SUBCUTANEOUS
  Administered 2019-04-24: 11 [IU] via SUBCUTANEOUS
  Administered 2019-04-24: 3 [IU] via SUBCUTANEOUS

## 2019-04-16 MED ORDER — TORSEMIDE 20 MG PO TABS: 60.0000 mg | ORAL_TABLET | Freq: Two times a day (BID) | ORAL | Status: DC

## 2019-04-16 MED ORDER — FAMOTIDINE 20 MG PO TABS
20.0000 mg | ORAL_TABLET | Freq: Every day | ORAL | Status: DC
  Administered 2019-04-17 – 2019-04-24 (×8): 20 mg via ORAL
  Filled 2019-04-16 (×8): qty 1

## 2019-04-16 MED ORDER — POLYETHYLENE GLYCOL 3350 17 G PO PACK
17.0000 g | PACK | Freq: Every day | ORAL | Status: DC
  Administered 2019-04-18 – 2019-04-24 (×6): 17 g via ORAL
  Filled 2019-04-16 (×7): qty 1

## 2019-04-16 MED ORDER — APIXABAN 5 MG PO TABS
5.0000 mg | ORAL_TABLET | Freq: Two times a day (BID) | ORAL | Status: DC
  Administered 2019-04-16 – 2019-04-24 (×17): 5 mg via ORAL
  Filled 2019-04-16 (×17): qty 1

## 2019-04-16 MED ORDER — DOCUSATE SODIUM 100 MG PO CAPS
100.0000 mg | ORAL_CAPSULE | Freq: Every day | ORAL | Status: DC
  Administered 2019-04-18 – 2019-04-24 (×7): 100 mg via ORAL
  Filled 2019-04-16 (×8): qty 1

## 2019-04-16 MED ORDER — INSULIN ASPART 100 UNIT/ML ~~LOC~~ SOLN
5.0000 [IU] | Freq: Three times a day (TID) | SUBCUTANEOUS | Status: DC
  Administered 2019-04-16 – 2019-04-21 (×15): 5 [IU] via SUBCUTANEOUS

## 2019-04-16 MED ORDER — SODIUM CHLORIDE 0.9 % IV SOLN
2.0000 g | INTRAVENOUS | Status: AC
  Administered 2019-04-16 – 2019-04-19 (×4): 2 g via INTRAVENOUS
  Filled 2019-04-16 (×4): qty 2

## 2019-04-16 MED ORDER — INSULIN ASPART 100 UNIT/ML ~~LOC~~ SOLN
0.0000 [IU] | Freq: Every day | SUBCUTANEOUS | Status: DC
  Administered 2019-04-16: 2 [IU] via SUBCUTANEOUS
  Administered 2019-04-17: 5 [IU] via SUBCUTANEOUS
  Administered 2019-04-18: 2 [IU] via SUBCUTANEOUS
  Administered 2019-04-19: 4 [IU] via SUBCUTANEOUS
  Administered 2019-04-20: 2 [IU] via SUBCUTANEOUS
  Administered 2019-04-21: 3 [IU] via SUBCUTANEOUS
  Administered 2019-04-22: 2 [IU] via SUBCUTANEOUS

## 2019-04-16 MED ORDER — METHYLPREDNISOLONE SODIUM SUCC 125 MG IJ SOLR: 40.0000 mg | Freq: Two times a day (BID) | INTRAMUSCULAR | Status: DC

## 2019-04-16 MED ORDER — METHYLPREDNISOLONE SODIUM SUCC 125 MG IJ SOLR
40.0000 mg | Freq: Two times a day (BID) | INTRAMUSCULAR | Status: DC
  Administered 2019-04-16: 40 mg via INTRAVENOUS
  Filled 2019-04-16 (×2): qty 2

## 2019-04-16 MED ORDER — FAMOTIDINE 40 MG/5ML PO SUSR: 20.0000 mg | Freq: Every day | ORAL | Status: DC

## 2019-04-16 NOTE — Progress Notes (Addendum)
..   NAME:  Frank Angert., MRN:  [id number], DOB:  October 13, 1962LOS: 3 ADMISSION DATE:  04/13/2019, CONSULTATION DATE:  04/13/2019 REFERRING MD:  Maryruth Bun MD CHIEF COMPLAINT:  SOB   Brief History   58 yr old morbidly obese M PMHx OHS, OSA, chronic respiratory failure with hypoxia and hypercapnia, IDDM, Afib, CAD, failed CABG in 2014, CKDstage 4, and diastolic HF presents to [facility] w/ SOB. Intubated, now extubated.   History of present illness   (History obtained from account of other providers and review of the EMR) 58 yr old morbidly obese M PMHx OHS, OSA, chronic respiratory failure with hypoxia and hypercapnia, IDDM, Afib, CAD, failed CABG in 2014, CVA, CKD stage 3, and diastolic HF presents to [facility] w/ SOB Per the ED RN documentation family reported patient being more somnolent. He is normally active and talking but contused at baseline but he appeared weak. At baseline he is on 2-3L home Oxygen. Pt denied recent cough or fever. His symptoms worsened with standing and ambulation.  On presentation to ED O2 sats in low 80s end tidal CO2 77, b/l wheezing with diminished sounds. While in ED the patient was stared on 6 L, EDP states that they were unable to place the patient on BiPAP due to current restrictions under COVID pandemic. "He was hypothermic; given hypoxia and AMS, initiated code sepsis with cultures and broad-spectrum antibiotics. Initial VBG concerning with pH 7.18, CO2 greater than 97. Patient was started on HFNC FiO2100% @ 60L. Patient eventually decompensated and became more somnolent, unresponsive to verbal stimuli, and progressively more hypoxic and was intubated. Extubated 8/25  Past Medical History  .Marland Kitchen Active Ambulatory Problems    Diagnosis Date Noted  . CAD (coronary artery disease) of artery bypass graft 09/09/2012  . 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 Obesity hypoventilation syndrome ([place]) 09/09/2012  . Acoustic neuroma ([place]) 10/12/2012  .  Status post craniotomy 10/12/2012  . 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 Edema 12/17/2012  . Headache due to intracranial disease 10/13/2013  . 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 Irritation of right eye 10/18/2015  . Acute on chronic respiratory failure with hypoxia and hypercapnia ([place]) 02/16/2016  . Hereditary and idiopathic peripheral neuropathy 01/08/2017  . Pressure injury of skin 01/29/2017  . Carbon dioxide retention   . Atrial fibrillation with RVR ([place])   . Acute diastolic heart failure ([place])   . Chronic diastolic heart failure ([place]) 05/20/2018  . CKD stage 3 secondary to diabetes ([place]) 09/14/2018  . IDDM (insulin dependent diabetes mellitus) ([place]) 09/14/2018  . Acute respiratory failure with hypoxia and hypercapnia ([place]) 02/22/2019  . SOB (shortness of breath) 02/22/2019  . Chronic obstructive pulmonary disease ([place]) 02/22/2019  . Acute on chronic diastolic heart failure ([place]) 02/23/2019  . Acute renal failure with acute renal cortical necrosis superimposed on stage 4 chronic kidney disease ([place])   . Morbid obesity with BMI of 50.0-59.9, adult ([place])   . Respiratory failure ([place]) 03/22/2019  . OSA (obstructive sleep apnea)    Resolved Ambulatory Problems    Diagnosis Date Noted  . Tobacco abuse 09/09/2012  . Prerenal renal failure 10/24/2012  . Impaired fasting glucose 10/24/2012  . Acute renal insufficiency 11/12/2012  . Acute encephalopathy 01/28/2017  . AKI (acute kidney injury) ([place])   . Acute metabolic encephalopathy 97/67/3419   Past Medical History:  Diagnosis Date  . Brain tumor ([place])   . Bruises easily   . CAD (coronary artery disease)   .  Cardiomyopathy, ischemic   . CHF (congestive heart failure) ([place])   . Cyst near coccyx   . Cyst near tailbone   . Diabetes mellitus without complication ([place])   . Edema of both legs   . Fibromyalgia   . Gout   . Headache(784.0)   . Hypertension   . Kidney stones   . Myocardial infarction ([place])   . Obesity    . Pneumonia   . Shortness of breath   . Stroke ([place])      Significant Hospital Events   8/1 > Admit with AMS, SOB > Believed secondary to volume overload and AECOPD 8/6 > Discharged, noted drug resistant UTI > Providencia rettgeri  8/23 >Presents to ED, Confusion SOB, Endotracheally intubated   Consults:  04/13/2019>>>PCCM  Procedures:  04/13/2019 ETT >> 8/25  Significant Diagnostic Tests:  CT Head 7/31 > no acute CXR 8/23 > Cardiomegaly with vascular congestion and bilateral airspace opacities, likely edema/CHF. Bibasilar edema versus infection.  Micro Data:  04/13/2019 >SARSCOV2 aptima negative  MRSA surveillance > negative 8/23 Urine > 80,000 Colonies of Proteus Penneri >>  8/23 Blood>> 8/26 NGTD 8/24: tracheal aspirate >> Usual Oral Flora 8/24: RVP >  negative   Antimicrobials:  Vancomycin  8/23 >>8/24 Cefepime      8/23 >> Metronidazole 8/23  Interval history/Subjective  Extubated during the day. No acute events overnight BP uncontrolled.  Hyperglycemic Remains on 3 L [place]  Objective   Blood pressure (!) 186/82, pulse 71, temperature 99.5 F (37.5 C), resp. rate 16, height 5\' 11"  (1.803 m), weight (!) 165.8 kg, SpO2 99 %.        Intake/Output Summary (Last 24 hours) at 04/16/2019 5329 Last data filed at 04/16/2019 0800 Gross per 24 hour  Intake 1257.98 ml  Output 6270 ml  Net -5012.02 ml   Filed Weights   04/14/19 0142 04/15/19 0410 04/16/19 0430  Weight: (!) 176.2 kg (!) 170.7 kg (!) 165.8 kg    Examination: General: Obese male resting in bed without distress HENT: [place]/AT CV: SR on telemetry, RRR.  +2 radial pulses. + edema in extremities.  Lungs: Diminished. Symmetric expansion,  No wheezing.  ABD: Obese, non tender, + bowel sounds GU: Foley EXT: Warm, 3+ edema to LE.   Skin: Intact, Chronic Vascular changes to lower extremities. Neuro: Alert, follows commands in all extremities, no focal deficits   Assessment & Plan:   Acute Encephalopathy in  setting of hypercarbia vs Urosepsis  H/O CVA H/O previous Stable encephalomalacia at the right CP angle and overlying craniectomy defect. -[place] on 7/31 which was negative for any acute pathology. Plan: -Continue to encourage appropriate sleep/wake cycle. Delirium prevention measures  Acute on Chronic Respiratory failure w/ hypercapnia and hypoxia in setting of multifocal PNA vs AECOPD vs Volume Overload  PMHx sig COPD, Obesity Hypoventilation and OSA CXR continues to show vascular congestion and bilateral airspace opacities Plan: -Supplemental Oxygen to maintain saturation > 88 5 -Continue Bronchodilators: LABA, ICS, LAMA -Solu-medrol --> Decrease to 40 mg BID -Continue Inhaled LABA, ICS and LAMA -Pulmonary hygiene with mobilization: PT following  Decompensated Diastolic HF HTN LVEF: 92-42% Cardiogenic pulmonary edema continues on CXR  Last EF 55-60% mildly dilated LV no evidence of systolic dysfunction ( ECHO was severely limited) CAD s/p 1V CABG 2009  H/O A.Fib  Plan: -Continue Imdur, Coreg.  Increase Imdur for optimal BP control.  -Statin -Continue amiodarone -Systemic a/c: Heparin gtt -Resume home torsemide tomorrow.  Additional diuresis today after K replaced  Leucocytosis  in setting of Multifocal PNA?? Vs UTI -Urine Culture with 80,000 colonies of proteus penneri >>> susceptibilities pending, previous urine culture 7/31 with resistance  -MRSA surveillance negative  Plan  -Continue Cefepime >> awaiting susceptibilities  IDDM  A1c 7.8 Secondary hypothyroidism TSH 0.8 (low normal) In Sept 2019 T4 free 0.92 ( low normal) T4 Free now 0.75 Plan: -Continue SSI and scheduled Insulin -Lantus to 45U  -Decreasing steroids today  Acute on Chronic Kidney Disease Stage 4 -Baseline Crt 2.3  Hypokalemia  Hypophosphatemia Plan: -Replace today prior to diuresis   Best practice:  Diet: Consistent carb Pain/Anxiety/Delirium protocol (if indicated): Not indicated VAP  protocol (if indicated): Not indicated DVT prophylaxis: Heparin gtt, h/o Afib GI prophylaxis: Pepcid Glucose control: ISS w/ long acting Mobility: Ambulate  Code Status: FULL Family Communication: Will update family  Disposition: Stable to transfer out of ICU.   Labs and imaging from past 24 hours reviewed in EMR.     Paulita Fujita, ACNP  Pulmonary & Critical Care  Pager: [phone]  Attending Note:  58 year old male with PMH of CHF presenting with pulmonary edema and respiratory failure that was extubated and now improved.  No events overnight, no new complaints.  On exam, coarse BS with bibasilar crackles.  I reviewed CXR myself, pulmonary edema noted.  Discussed with PCCM-NP.  Acute respiratory failure:  - Monitor for airway protection  Acute pulmonary edema:  - Diureses as ordered  Hypoxemia:  - Titrate O2 for sat of 88-92%  CRI:  - lasix  - BMET in AM  Transfer to SDU and to [facility] with PCCM-NP  Patient seen and examined, agree with above note.  I dictated the care and orders written for this patient under my direction.  Rush Farmer, [place]

## 2019-04-16 NOTE — Patient Outreach (Signed)
  [facility]) Care Management Chronic Special Needs Program   04/16/2019  Name: [name]., DOB: 1961/03/24  MRN: [id number]  The client was discussed in today's interdisciplinary care team meeting.  The following issues were discussed:  Changes in health status, Coordination of care and Care transitions   Participants present:  Bary Castilla, RNCM; Mahlon Gammon, RNCM; Peter Garter, RNCM;  Thea Silversmith, RNCM; Kelli Churn; [facility]; Crist Fat, PharmD; Dr. Coralie Carpen, Dr. Marco Collie.  Plan: RNCM will continue care coordination with Hospital Liaison/inpatient care management regarding discharge transition needs.  Thea Silversmith, RN, MSN, [place] Wink [phone]

## 2019-04-16 NOTE — Progress Notes (Signed)
Physical Therapy Treatment Patient Details Name: [name]. MRN: [id number] DOB: 17-Apr-1961 Today's Date: 04/16/2019    History of Present Illness Pt is a 58 year old man admitted 04/13/19 with SOB and hypoxia requiring intubation. Extubated 8/25. PMH: morbid obesity, OSA, chronic respiratory failure with hypoxia and hypercapnia, DM, afib, CAD, failed CABG, CKD 4, CHF, CVA, acoustic neuroma. This is pt's 3rd admission in 6 months.    PT Comments    Pt seen for second time today with OT for continued mobility after limited this AM by pt's need and extensive time for bedpan. Pt able to mobilize to bathroom and back to chair.   Follow Up Recommendations  Home health PT;Supervision for mobility/OOB     Equipment Recommendations  None recommended by PT    Recommendations for Other Services       Precautions / Restrictions Precautions Precautions: Fall Precaution Comments: blind in R eye Restrictions Weight Bearing Restrictions: No    Mobility  Bed Mobility Overal bed mobility: Needs Assistance Bed Mobility: Supine to Sit     Supine to sit: Supervision;HOB elevated     General bed mobility comments: supervision for safety and lines, HOB up  Transfers Overall transfer level: Needs assistance Equipment used: 4-wheeled walker Transfers: Sit to/from Stand Sit to Stand: Min assist;+2 safety/equipment         General transfer comment: Assist for safety. Pt pulls up on handles of rollator. Incr time and effort to stand  Ambulation/Gait Ambulation/Gait assistance: Min assist;+2 safety/equipment Gait Distance (Feet): 10 Feet(x 2) Assistive device: 4-wheeled walker;1 person hand held assist Gait Pattern/deviations: Step-through pattern;Decreased step length - right;Decreased step length - left;Trunk flexed;Wide base of support Gait velocity: decr Gait velocity interpretation: <1.31 ft/sec, indicative of household ambulator General Gait Details: Assist for  balance and support. Rollator wouldn't fit in bathroom so hand held on 1 side and wall rail on other side   Stairs             Wheelchair Mobility    Modified Rankin (Stroke Patients Only)       Balance Overall balance assessment: Needs assistance Sitting-balance support: No upper extremity supported;Feet supported Sitting balance-Leahy Scale: Fair     Standing balance support: Bilateral upper extremity supported Standing balance-Leahy Scale: Poor Standing balance comment: UE support and min guard for static standing                            Cognition Arousal/Alertness: Awake/alert Behavior During Therapy: Flat affect Overall Cognitive Status: Impaired/Different from baseline Area of Impairment: Orientation;Attention;Memory;Following commands;Safety/judgement;Awareness;Problem solving                 Orientation Level: Disoriented to;Situation;Time Current Attention Level: Sustained Memory: Decreased short-term memory Following Commands: Follows one step commands with increased time(and multimodal cues) Safety/Judgement: Decreased awareness of safety;Decreased awareness of deficits Awareness: Intellectual Problem Solving: Slow processing;Decreased initiation;Difficulty sequencing;Requires verbal cues;Requires tactile cues General Comments: no family available to determine baseline cognition      Exercises      General Comments General comments (skin integrity, edema, etc.): Pt with HR into 160-180's on monitor when in bathroom       Pertinent Vitals/Pain Pain Assessment: No/denies pain    Home Living Family/patient expects to be discharged to:: Private residence Living Arrangements: Spouse/significant other Available Help at Discharge: Family;Available PRN/intermittently Type of Home: Mobile home Home Access: Ramped entrance   Home Layout: One level Home Equipment: Gilford Rile -  4 wheels;Bedside commode      Prior Function Level of  Independence: Needs assistance  Gait / Transfers Assistance Needed: Ambulates with rollator ADL's / Homemaking Assistance Needed: assisted for sponge bathing and uses toilet aide for pericare, can dress independently     PT Goals (current goals can now be found in the care plan section) Acute Rehab PT Goals Patient Stated Goal: to have a BM on the toilet PT Goal Formulation: With patient Time For Goal Achievement: 04/30/19 Potential to Achieve Goals: Fair    Frequency    Min 3X/week      PT Plan      Co-evaluation PT/OT/SLP Co-Evaluation/Treatment: Yes Reason for Co-Treatment: Necessary to address cognition/behavior during functional activity;For patient/therapist safety PT goals addressed during session: Mobility/safety with mobility;Balance OT goals addressed during session: ADL's and self-care      AM-PAC PT "6 Clicks" Mobility   Outcome Measure  Help needed turning from your back to your side while in a flat bed without using bedrails?: A Little Help needed moving from lying on your back to sitting on the side of a flat bed without using bedrails?: A Little Help needed moving to and from a bed to a chair (including a wheelchair)?: A Little Help needed standing up from a chair using your arms (e.g., wheelchair or bedside chair)?: A Little Help needed to walk in hospital room?: A Little Help needed climbing 3-5 steps with a railing? : A Lot 6 Click Score: 17    End of Session Equipment Utilized During Treatment: Oxygen Activity Tolerance: Patient limited by fatigue Patient left: in chair;with call bell/phone within reach;with chair alarm set Nurse Communication: Mobility status PT Visit Diagnosis: Muscle weakness (generalized) (M62.81);Difficulty in walking, not elsewhere classified (R26.2)     Time: 0375-4360 PT Time Calculation (min) (ACUTE ONLY): 45 min  Charges:  $Therapeutic Activity: 8-22 mins                     [place] Pager [phone] Office [place] 04/16/2019, 2:13 PM

## 2019-04-16 NOTE — Evaluation (Addendum)
Occupational Therapy Evaluation Patient Details Name: [name]. MRN: [id number] DOB: 04/29/1961 Today's Date: 04/16/2019    History of Present Illness Pt is a 58 year old man admitted 04/13/19 with SOB and hypoxia requiring intubation. Extubated 8/25. PMH: morbid obesity, OSA, chronic respiratory failure with hypoxia and hypercapnia, DM, afib, CAD, failed CABG, CKD 4, CHF, CVA, acoustic neuroma. This is pt's 3rd admission in 6 months.   Clinical Impression   Pt was ambulating with a rollator and assisted for sponge bathing. He dressed himself and used a toilet aide for pericare. Pt presents with impaired cognition, blindness in R eye and decreased standing balance. He is back on his usual 3L 02 and maintaining 02 sats > 90%. Pt likely near his baseline. Will follow acutely.     Follow Up Recommendations  Home health OT;Supervision/Assistance - 24 hour (may not require any further follow up)   Equipment Recommendations  None recommended by OT    Recommendations for Other Services       Precautions / Restrictions Precautions Precautions: Fall Precaution Comments: blind in R eye Restrictions Weight Bearing Restrictions: No      Mobility Bed Mobility Overal bed mobility: Needs Assistance Bed Mobility: Supine to Sit     Supine to sit: Supervision;HOB elevated     General bed mobility comments: supervision for safety and lines, HOB up  Transfers Overall transfer level: Needs assistance Equipment used: Rolling walker (2 wheeled) Transfers: Sit to/from Stand Sit to Stand: Min assist;+2 safety/equipment         General transfer comment: pulls up on rollator, min assist for safety, increased time for sit to stand    Balance Overall balance assessment: Needs assistance   Sitting balance-Leahy Scale: Fair     Standing balance support: Bilateral upper extremity supported Standing balance-Leahy Scale: Poor                             ADL  either performed or assessed with clinical judgement   ADL Overall ADL's : Needs assistance/impaired Eating/Feeding: Independent;Sitting   Grooming: Set up;Sitting   Upper Body Bathing: Maximal assistance;Sitting   Lower Body Bathing: Total assistance;Sit to/from stand   Upper Body Dressing : Set up;Sitting   Lower Body Dressing: Maximal assistance;Sit to/from stand   Toilet Transfer: Minimal assistance;+2 for safety/equipment;Ambulation;RW;Regular Toilet;Grab bars   Toileting- Clothing Manipulation and Hygiene: Total assistance;+2 for safety/equipment;Sit to/from stand       Functional mobility during ADLs: Minimal assistance;+2 for safety/equipment;Rolling walker;Cueing for safety       Vision Patient Visual Report: No change from baseline;Other (comment)(blind in R eye)       Perception     Praxis      Pertinent Vitals/Pain Pain Assessment: No/denies pain     Hand Dominance Right   Extremity/Trunk Assessment Upper Extremity Assessment Upper Extremity Assessment: Overall WFL for tasks assessed   Lower Extremity Assessment Lower Extremity Assessment: Defer to PT evaluation   Cervical / Trunk Assessment Cervical / Trunk Assessment: Other exceptions Cervical / Trunk Exceptions: morbid obesity   Communication Communication Communication: HOH;Expressive difficulties   Cognition Arousal/Alertness: Awake/alert Behavior During Therapy: Flat affect Overall Cognitive Status: Impaired/Different from baseline Area of Impairment: Orientation;Attention;Memory;Following commands;Safety/judgement;Awareness;Problem solving                 Orientation Level: Disoriented to;Situation;Time Current Attention Level: Sustained Memory: Decreased short-term memory Following Commands: Follows one step commands with increased time(and multimodal cues) Safety/Judgement:  Decreased awareness of safety;Decreased awareness of deficits Awareness: Intellectual Problem Solving:  Slow processing;Decreased initiation;Difficulty sequencing;Requires verbal cues;Requires tactile cues General Comments: no family available to determine baseline cognition   General Comments       Exercises     Shoulder Instructions      Home Living Family/patient expects to be discharged to:: Private residence Living Arrangements: Spouse/significant other Available Help at Discharge: Family;Available PRN/intermittently Type of Home: Mobile home Home Access: Ramped entrance     Home Layout: One level     Bathroom Shower/Tub: Teacher, early years/pre: Standard     Home Equipment: Environmental consultant - 4 wheels;Bedside commode          Prior Functioning/Environment Level of Independence: Needs assistance  Gait / Transfers Assistance Needed: Ambulates with rollator ADL's / Homemaking Assistance Needed: assisted for sponge bathing and uses toilet aide for pericare, can dress independently            OT Problem List: Impaired balance (sitting and/or standing);Decreased activity tolerance;Decreased cognition;Decreased safety awareness;Decreased knowledge of use of DME or AE;Obesity      OT Treatment/Interventions: Self-care/ADL training;DME and/or AE instruction;Cognitive remediation/compensation;Patient/family education;Balance training;Therapeutic activities    OT Goals(Current goals can be found in the care plan section) Acute Rehab OT Goals Patient Stated Goal: to have a BM on the toilet OT Goal Formulation: With patient Time For Goal Achievement: 04/30/19 Potential to Achieve Goals: Good ADL Goals Pt Will Perform Grooming: with supervision;standing Pt Will Perform Upper Body Dressing: with set-up;sitting Pt Will Perform Lower Body Dressing: with supervision;sit to/from stand Pt Will Transfer to Toilet: with supervision;ambulating  OT Frequency: Min 2X/week   Barriers to D/C:            Co-evaluation PT/OT/SLP Co-Evaluation/Treatment: Yes Reason for  Co-Treatment: For patient/therapist safety;Necessary to address cognition/behavior during functional activity   OT goals addressed during session: ADL's and self-care      AM-PAC OT "6 Clicks" Daily Activity     Outcome Measure Help from another person eating meals?: None Help from another person taking care of personal grooming?: A Little Help from another person toileting, which includes using toliet, bedpan, or urinal?: Total Help from another person bathing (including washing, rinsing, drying)?: A Lot Help from another person to put on and taking off regular upper body clothing?: A Little Help from another person to put on and taking off regular lower body clothing?: A Lot 6 Click Score: 15   End of Session Equipment Utilized During Treatment: Rolling walker;Oxygen(3L) Nurse Communication: Mobility status;Other (comment)(had BM, condom cath off)  Activity Tolerance: Patient tolerated treatment well Patient left: in chair;with call bell/phone within reach;with chair alarm set  OT Visit Diagnosis: Unsteadiness on feet (R26.81);Other abnormalities of gait and mobility (R26.89);Other symptoms and signs involving cognitive function                Time: 1021-1106 OT Time Calculation (min): 45 min Charges:  OT General Charges $OT Visit: 1 Visit OT Treatments Moderate evaluation-1  Malka So 04/16/2019, 12:54 PM  Nestor Lewandowsky, OTR/L Acute Rehabilitation Services Pager: [phone] Office: [phone]

## 2019-04-16 NOTE — Evaluation (Signed)
Physical Therapy Evaluation Patient Details Name: [name]. MRN: [id number] DOB: Jan 04, 1961 Today's Date: 04/16/2019   History of Present Illness  Pt is a 58 year old man admitted 04/13/19 with SOB and hypoxia requiring intubation. Extubated 8/25. PMH: morbid obesity, OSA, chronic respiratory failure with hypoxia and hypercapnia, DM, afib, CAD, failed CABG, CKD 4, CHF, CVA, acoustic neuroma. This is pt's 3rd admission in 6 months.  Clinical Impression  Pt admitted with above diagnosis and presents to PT with functional limitations due to deficits listed below (See PT problem list). Pt needs skilled PT to maximize independence and safety to allow discharge to home with familiy. Eval limited by pt needing the bedpan and then needing to stay on it for extended time. Expect he is close to baseline and will be able to return home with family. Will follow up later to re-attempt further mobility.      Follow Up Recommendations Home health PT;Supervision for mobility/OOB    Equipment Recommendations  None recommended by PT    Recommendations for Other Services       Precautions / Restrictions Precautions Precautions: Fall Precaution Comments: blind in R eye Restrictions Weight Bearing Restrictions: No      Mobility  Bed Mobility Overal bed mobility: Needs Assistance Bed Mobility: Rolling     Rolling: Min Guard     General bed mobility comments: supervision for safety and lines  Transfers           Ambulation/Gait   Stairs            Wheelchair Mobility    Modified Rankin (Stroke Patients Only)       Balance                             Pertinent Vitals/Pain Pain Assessment: No/denies pain    Home Living Family/patient expects to be discharged to:: Private residence Living Arrangements: Spouse/significant other Available Help at Discharge: Family;Available PRN/intermittently Type of Home: Mobile home Home Access: Ramped  entrance     Home Layout: One level Home Equipment: [place] - 4 wheels;Bedside commode      Prior Function Level of Independence: Needs assistance   Gait / Transfers Assistance Needed: Ambulates with rollator  ADL's / Homemaking Assistance Needed: assisted for sponge bathing and uses toilet aide for pericare, can dress independently        Hand Dominance   Dominant Hand: Right    Extremity/Trunk Assessment   Upper Extremity Assessment Upper Extremity Assessment: Defer to OT evaluation    Lower Extremity Assessment Lower Extremity Assessment: Generalized weakness    Cervical / Trunk Assessment Cervical / Trunk Assessment: Other exceptions Cervical / Trunk Exceptions: morbid obesity  Communication   Communication: HOH;Expressive difficulties  Cognition Arousal/Alertness: Awake/alert Behavior During Therapy: Flat affect Overall Cognitive Status: Impaired/Different from baseline Area of Impairment: Orientation;Attention;Memory;Following commands;Safety/judgement;Awareness;Problem solving                 Orientation Level: Disoriented to;Situation;Time Current Attention Level: Sustained Memory: Decreased short-term memory Following Commands: Follows one step commands with increased time(and multimodal cues) Safety/Judgement: Decreased awareness of safety;Decreased awareness of deficits Awareness: Intellectual Problem Solving: Slow processing;Decreased initiation;Difficulty sequencing;Requires verbal cues;Requires tactile cues General Comments: no family available to determine baseline cognition      General Comments General comments (skin integrity, edema, etc.): Pt with HR into 160-180's on monitor when in bathroom     Exercises     Assessment/Plan  PT Assessment Patient needs continued PT services  PT Problem List Decreased strength;Decreased activity tolerance;Decreased balance;Decreased mobility;Decreased cognition       PT Treatment Interventions  DME instruction;Gait training;Functional mobility training;Therapeutic activities;Therapeutic exercise;Balance training;Patient/family education    PT Goals (Current goals can be found in the Care Plan section)  Acute Rehab PT Goals Patient Stated Goal: to have a BM on the toilet PT Goal Formulation: With patient Time For Goal Achievement: 04/30/19 Potential to Achieve Goals: Fair    Frequency Min 3X/week   Barriers to discharge        Co-evaluation     AM-PAC PT "6 Clicks" Mobility  Outcome Measure Help needed turning from your back to your side while in a flat bed without using bedrails?: A Little Help needed moving from lying on your back to sitting on the side of a flat bed without using bedrails?: A Little Help needed moving to and from a bed to a chair (including a wheelchair)?: A Little Help needed standing up from a chair using your arms (e.g., wheelchair or bedside chair)?: A Little Help needed to walk in hospital room?: A Little Help needed climbing 3-5 steps with a railing? : A Lot 6 Click Score: 17    End of Session Equipment Utilized During Treatment: Oxygen Activity Tolerance: Patient limited by fatigue Patient left: in chair;with call bell/phone within reach;with chair alarm set Nurse Communication: Mobility status PT Visit Diagnosis: Muscle weakness (generalized) (M62.81);Difficulty in walking, not elsewhere classified (R26.2)    Time: 5701-7793 PT Time Calculation (min) (ACUTE ONLY): 45 min   Charges:     PT Evaluation $PT Eval Moderate Complexity: [place] Pager [phone] Office [place] 04/16/2019, 2:17 PM

## 2019-04-16 NOTE — Discharge Instructions (Signed)

## 2019-04-16 NOTE — Progress Notes (Signed)
ANTICOAGULATION CONSULT NOTE - Follow Up Consult  Pharmacy Consult for heparin Indication: atrial fibrillation  Labs: Recent Labs    04/13/19 1623  04/14/19 0437  04/14/19 2112 04/15/19 0313 04/16/19 0308 04/16/19 0354  HGB 12.0*   < > 11.4*  --   --  11.6* 12.0* 13.9  HCT 42.2   < > 39.7  --   --  39.3 39.3 41.0  PLT 208  --  214  --   --  230 256  --   APTT 43*  --  31   < > 72* 68* 59*  --   LABPROT 16.3*  --   --   --   --   --   --   --   INR 1.3*  --   --   --   --   --   --   --   HEPARINUNFRC  --   --  1.06*  --   --  0.95* 1.02*  --   CREATININE 2.51*  --  2.72*  --   --  3.32* 3.50*  --    < > = values in this interval not displayed.    Assessment: 58yo male now subtherapeutic on heparin after two PTTs at lower end of goal; no gtt issues or signs of bleeding per RN.  Goal of Therapy:  aPTT 66-102 seconds   Plan:  Will increase heparin gtt by 10% to 2300 units/hr and check PTT in 8 hours.    Wynona Neat, PharmD, BCPS  04/16/2019,4:31 AM

## 2019-04-16 NOTE — Progress Notes (Signed)
Wife Romie Tay notified of patient transferring to room 2w10.

## 2019-04-16 NOTE — Progress Notes (Addendum)
Inpatient Diabetes Program Recommendations  AACE/ADA: New Consensus Statement on Inpatient Glycemic Control (2015)  Target Ranges:  Prepandial:   less than 140 mg/dL      Peak postprandial:   less than 180 mg/dL (1-2 hours)      Critically ill patients:  140 - 180 mg/dL   Lab Results  Component Value Date   GLUCAP 227 (H) 04/16/2019   HGBA1C 7.8 (H) 04/14/2019    Review of Glycemic Control Results for MALAKIE, BALIS (MRN [id number]) as of 04/16/2019 09:20  Ref. Range 04/15/2019 08:21 04/15/2019 11:26 04/15/2019 15:43 04/15/2019 19:43 04/16/2019 00:01 04/16/2019 04:04 04/16/2019 07:15  Glucose-Capillary Latest Ref Range: 70 - 99 mg/dL 302 (H)  Novolog 16 units  Solumedrol 60 mg  307 (H)  Novolog 15 units 257 (H)  Novolog 11 units  Solumedrol 40 mg 243 (H)  Novolog 7 units  Solumedrol 40 mg  Lantus 45 units 271 (H)  Novolog 11 units 256 (H)  Novolog 11 units 227 (H)  Novolog 7 units   Diabetes history: DM 2 Outpatient Diabetes medications: Lantus 45 units Current orders for Inpatient glycemic control:  Lantus 45 units Novolog 0-20 units Q4 hours Novolog 5 units Tube Feed Coverage  Solumedrol 40 mg Q12 hours  Extubated 8/25  Inpatient Diabetes Program Recommendations:    Consider increasing Lantus to 50 units  Thanks,  Tama Headings RN, MSN, BC-ADM Inpatient Diabetes Coordinator Team Pager [phone] (8a-5p)

## 2019-04-16 NOTE — Progress Notes (Signed)
Contacted Dr. Nelda Marseille to request clarification on Telemetry or Med/Surg and continuous pulsox.

## 2019-04-17 ENCOUNTER — Other Ambulatory Visit: Payer: Self-pay

## 2019-04-17 ENCOUNTER — Telehealth (HOSPITAL_COMMUNITY): Payer: Self-pay

## 2019-04-17 LAB — GLUCOSE, CAPILLARY
Glucose-Capillary: 229 mg/dL — ABNORMAL HIGH (ref 70–99)
Glucose-Capillary: 242 mg/dL — ABNORMAL HIGH (ref 70–99)
Glucose-Capillary: 206 mg/dL — ABNORMAL HIGH (ref 70–99)
Glucose-Capillary: 206 mg/dL — ABNORMAL HIGH (ref 70–99)
Glucose-Capillary: 352 mg/dL — ABNORMAL HIGH (ref 70–99)

## 2019-04-17 LAB — RENAL FUNCTION PANEL
Albumin: 3.1 g/dL — ABNORMAL LOW (ref 3.5–5.0)
Anion gap: 16 — ABNORMAL HIGH (ref 5–15)
BUN: 85 mg/dL — ABNORMAL HIGH (ref 6–20)
CO2: 34 mmol/L — ABNORMAL HIGH (ref 22–32)
Calcium: 9 mg/dL (ref 8.9–10.3)
Chloride: 89 mmol/L — ABNORMAL LOW (ref 98–111)
Creatinine, Ser: 3.12 mg/dL — ABNORMAL HIGH (ref 0.61–1.24)
GFR calc Af Amer: 24 mL/min — ABNORMAL LOW (ref >60)
GFR calc non Af Amer: 21 mL/min — ABNORMAL LOW (ref >60)
Glucose, Bld: 238 mg/dL — ABNORMAL HIGH (ref 70–99)
Phosphorus: 4.8 mg/dL — ABNORMAL HIGH (ref 2.5–4.6)
Potassium: 3.2 mmol/L — ABNORMAL LOW (ref 3.5–5.1)
Sodium: 139 mmol/L (ref 135–145)

## 2019-04-17 LAB — CBC
HCT: 40 % (ref 39.0–52.0)
Hemoglobin: 11.9 g/dL — ABNORMAL LOW (ref 13.0–17.0)
MCH: 29.2 pg (ref 26.0–34.0)
MCHC: 29.8 g/dL — ABNORMAL LOW (ref 30.0–36.0)
MCV: 98.3 fL (ref 80.0–100.0)
Platelets: 199 10*3/uL (ref 150–400)
RBC: 4.07 MIL/uL — ABNORMAL LOW (ref 4.22–5.81)
RDW: 13.5 % (ref 11.5–15.5)
WBC: 10.7 10*3/uL — ABNORMAL HIGH (ref 4.0–10.5)
nRBC: 0 % (ref 0.0–0.2)

## 2019-04-17 LAB — NOVEL CORONAVIRUS, NAA (HOSP ORDER, SEND-OUT TO REF LAB; TAT 18-24 HRS): SARS-CoV-2, NAA: NOT DETECTED

## 2019-04-17 MED ORDER — ARTIFICIAL TEARS OPHTHALMIC OINT
1.0000 "application " | TOPICAL_OINTMENT | Freq: Every evening | OPHTHALMIC | Status: DC | PRN
  Filled 2019-04-17: qty 3.5

## 2019-04-17 MED ORDER — TORSEMIDE 20 MG PO TABS
60.0000 mg | ORAL_TABLET | Freq: Two times a day (BID) | ORAL | Status: DC
  Administered 2019-04-17 – 2019-04-20 (×7): 60 mg via ORAL
  Filled 2019-04-17 (×7): qty 3

## 2019-04-17 MED ORDER — HYPROMELLOSE (GONIOSCOPIC) 2.5 % OP SOLN
1.0000 [drp] | Freq: Three times a day (TID) | OPHTHALMIC | Status: DC | PRN
  Administered 2019-04-17 – 2019-04-19 (×3): 1 [drp] via OPHTHALMIC
  Filled 2019-04-17: qty 15

## 2019-04-17 MED ORDER — POTASSIUM CHLORIDE CRYS ER 20 MEQ PO TBCR
40.0000 meq | EXTENDED_RELEASE_TABLET | Freq: Two times a day (BID) | ORAL | Status: AC
  Administered 2019-04-17 – 2019-04-18 (×4): 40 meq via ORAL
  Filled 2019-04-17 (×4): qty 2

## 2019-04-17 NOTE — Progress Notes (Signed)
PROGRESS NOTE    [name].  MHD:[id number] DOB: Jan 13, 1961 DOA: 04/13/2019 PCP: Caren Macadam, MD    Brief Narrative:  58 year old morbidly obese man with history of obstructive sleep apnea, obesity hypoventilation, not on any CPAP or BiPAP at home, chronic respiratory failure with hypoxia and hypercapnia, IDDM, coronary artery disease status post prior CABG, CKD stage IV and diastolic congestive heart failure brought to the emergency room where he was found somnolent.  Patient on 3 L of oxygen at home.  In the emergency room oxygen was 80, tidal CO2 77, bilateral wheezing with diminished sounds.  ABG showed pH 7.18, CO2 more than 97.  Started on high flow nasal cannula, became somnolent and unresponsive.  He was intubated and admitted to the ICU.  Previous multiple admissions for altered mental status and hypercapnia.  Recent discharge on 8/6. Intubated, 04/13/2019-04/15/2019  Assessment & Plan:   Active Problems:   Obesity hypoventilation syndrome (HCC)   CKD stage 3 secondary to diabetes (HCC)   IDDM (insulin dependent diabetes mellitus) (HCC)   Chronic obstructive pulmonary disease (HCC)   Acute on chronic diastolic heart failure (HCC)  Acute on chronic respiratory failure with hypoxia and hypercapnia in the setting of untreated sleep apnea and obesity hypoventilation and sepsis: Mechanical ventilation, 8/23- 8/25 On nasal cannula oxygen, keep on oxygen to keep saturation more than 90%. Aggressive bronchodilator therapy, IV and inhalation steroids, slow taper Respiratory therapy and chest physiotherapy. Recurrent encephalopathy probably due to hypercapnia, presentation CO2 more than 97.  He will benefit with CPAP.  Patient denies any use of CPAP at home. Will discuss with patient's spouse about his previous sleep studies.  Acute metabolic encephalopathy: Probably due to hypercapnia aggravated by use of narcotics.  Also might have been related to UTI with sepsis.   Acute UTI present on admission with sepsis: Blood cultures negative.  Urine cultures with multidrug-resistant Proteus Penneri.  Sensitive to ceftriaxone.  Day 2/5 of ceftriaxone.  Continue.  Insulin-dependent type 2 diabetes with uncontrolled blood sugars: Blood sugars elevated due to use of IV steroids.  Continue current dose of insulin.  Will taper off his steroids.  Chronic diastolic heart failure: Currently euvolemic.  Continue Imdur, Coreg.  Imdur dose increased.  Resume torsemide along with potassium supplements.  Paroxysmal A. fib: Currently sinus rhythm.  Patient on amiodarone.  Therapeutic on Eliquis.  AKI on CKD stage IV: Baseline creatinine about 2.3.  Aggravated due to #1.  Gradually improving.  Continue to monitor.  Hypokalemia: Replace and monitor levels.  Magnesium and phosphorus normalized.   DVT prophylaxis: Eliquis Code Status: Full code Family Communication: None.  Not able to talk to wife on the phone. Disposition Plan: Home with home health PT OT.   Consultants:   PCCM signed off  Procedures:   None  Antimicrobials:  Vancomycin  8/23 >>8/24 Cefepime      8/23 >>8/26 Metronidazole 8/23>>8/26 Rocephin 8/26>>>  Micro 04/13/2019 >SARSCOV2 aptima negative  MRSA surveillance > negative 8/23 Urine > 80,000 Colonies of Proteus Penneri >>  8/23 Blood>> 8/26 NGTD 8/24: tracheal aspirate >> Usual Oral Flora 8/24: RVP >  negative     Subjective: Patient seen and examined.  Denies any overnight events.  Patient states that he cannot sleep with CPAP.  Denies any chest pain or shortness of breath.  No fever.  Objective: Vitals:   04/16/19 2333 04/17/19 0408 04/17/19 0740 04/17/19 0817  BP: 130/69  132/74   Pulse: 62  64   Resp: Marland Kitchen)  22     Temp: 98.6 F (37 C)  98 F (36.7 C)   TempSrc: Oral  Oral   SpO2: 97%  97% 97%  Weight:  (!) 164.2 kg    Height:        Intake/Output Summary (Last 24 hours) at 04/17/2019 1304 Last data filed at 04/17/2019  1100 Gross per 24 hour  Intake 1121 ml  Output 4300 ml  Net -3179 ml   Filed Weights   04/16/19 0430 04/16/19 1519 04/17/19 0408  Weight: (!) 165.8 kg (!) 165.1 kg (!) 164.2 kg    Examination:  General exam: Appears calm and comfortable, chronically sick looking, morbidly obese, Respiratory system: Clear to auscultation. Respiratory effort normal.  Bilateral poor air entry and conducted airway sounds Cardiovascular system: S1 & S2 heard, RRR. No JVD, murmurs, rubs, gallops or clicks.  Bilateral obese legs.  1+ edema.. Gastrointestinal system: Abdomen is distended but nontender.soft and nontender. No organomegaly or masses felt. Normal bowel sounds heard. Central nervous system: Alert and oriented.  Chronic right facial droop.  No focal neurological deficits. Extremities: Symmetric 5 x 5 power.  Generalized weakness. Skin: No rashes, lesions or ulcers, excoriations along the perineum with moisture. Psychiatry: Judgement and insight appear normal. Mood & affect appropriate.     Data Reviewed: I have personally reviewed following labs and imaging studies  CBC: Recent Labs  Lab 04/13/19 1623  04/14/19 0437 04/15/19 0313 04/16/19 0308 04/16/19 0354 04/17/19 0357  WBC 9.1  --  8.0 14.2* 14.9*  --  10.7*  NEUTROABS 6.8  --   --   --   --   --   --   HGB 12.0*   < > 11.4* 11.6* 12.0* 13.9 11.9*  HCT 42.2   < > 39.7 39.3 39.3 41.0 40.0  MCV 104.7*  --  101.8* 99.5 97.8  --  98.3  PLT 208  --  214 230 256  --  199   < > = values in this interval not displayed.   Basic Metabolic Panel: Recent Labs  Lab 04/13/19 1623  04/13/19 2017  04/14/19 0437 04/15/19 0313 04/16/19 0308 04/16/19 0354 04/17/19 0357  NA 142   < >  --    < > 144 141 141 140 139  K 4.8   < >  --    < > 4.4 3.0* 2.8* 2.9* 3.2*  CL 97*  --   --   --  98 93* 87*  --  89*  CO2 35*  --   --   --  29 33* 36*  --  34*  GLUCOSE 232*  --   --   --  228* 303* 273*  --  238*  BUN 33*  --   --   --  38* 61* 81*  --   85*  CREATININE 2.51*  --   --   --  2.72* 3.32* 3.50*  --  3.12*  CALCIUM 8.8*  --   --   --  9.2 9.3 8.9  --  9.0  MG  --   --  2.1  --  2.0  --  2.3  --   --   PHOS  --   --  4.1  --  <1.0* 3.0 4.0  4.1  --  4.8*   < > = values in this interval not displayed.   GFR: Estimated Creatinine Clearance: 40.5 mL/min (A) (by C-G formula based on SCr of 3.12 mg/dL (H)). Liver  Function Tests: Recent Labs  Lab 04/13/19 1623 04/15/19 0313 04/16/19 0308 04/17/19 0357  AST 22  --   --   --   ALT 27  --   --   --   ALKPHOS 289*  --   --   --   BILITOT 0.7  --   --   --   PROT 7.7  --   --   --   ALBUMIN 3.4* 3.2* 3.2* 3.1*   No results for input(s): LIPASE, AMYLASE in the last 168 hours. No results for input(s): AMMONIA in the last 168 hours. Coagulation Profile: Recent Labs  Lab 04/13/19 1623  INR 1.3*   Cardiac Enzymes: No results for input(s): CKTOTAL, CKMB, CKMBINDEX, TROPONINI in the last 168 hours. BNP (last 3 results) No results for input(s): PROBNP in the last 8760 hours. HbA1C: No results for input(s): HGBA1C in the last 72 hours. CBG: Recent Labs  Lab 04/16/19 1800 04/16/19 2113 04/17/19 0638 04/17/19 0738 04/17/19 1145  GLUCAP 262* 227* 229* 242* 206*   Lipid Profile: No results for input(s): CHOL, HDL, LDLCALC, TRIG, CHOLHDL, LDLDIRECT in the last 72 hours. Thyroid Function Tests: No results for input(s): TSH, T4TOTAL, FREET4, T3FREE, THYROIDAB in the last 72 hours. Anemia Panel: No results for input(s): VITAMINB12, FOLATE, FERRITIN, TIBC, IRON, RETICCTPCT in the last 72 hours. Sepsis Labs: Recent Labs  Lab 04/13/19 1623 04/13/19 2017  PROCALCITON  --  0.20  LATICACIDVEN 1.1 1.2    Recent Results (from the past 240 hour(s))  Urine culture     Status: Abnormal   Collection Time: 04/13/19  4:44 PM   Specimen: In/Out Cath Urine  Result Value Ref Range Status   Specimen Description IN/OUT CATH URINE  Final   Special Requests   Final    NONE Performed  at [facility], [address], [place], [place] [address]    Culture 80,000 COLONIES/mL PROTEUS PENNERI (A)  Final   Report Status 04/16/2019 FINAL  Final   Organism ID, Bacteria PROTEUS PENNERI (A)  Final      Susceptibility   Proteus penneri - MIC*    AMPICILLIN >=32 RESISTANT Resistant     CEFAZOLIN >=64 RESISTANT Resistant     CEFTRIAXONE <=1 SENSITIVE Sensitive     CIPROFLOXACIN >=4 RESISTANT Resistant     GENTAMICIN <=1 SENSITIVE Sensitive     IMIPENEM 0.5 SENSITIVE Sensitive     NITROFURANTOIN 128 RESISTANT Resistant     TRIMETH/SULFA <=20 SENSITIVE Sensitive     AMPICILLIN/SULBACTAM 16 INTERMEDIATE Intermediate     PIP/TAZO <=4 SENSITIVE Sensitive     * 80,000 COLONIES/mL PROTEUS PENNERI  Blood Culture (routine x 2)     Status: None (Preliminary result)   Collection Time: 04/13/19  4:45 PM   Specimen: BLOOD RIGHT WRIST  Result Value Ref Range Status   Specimen Description BLOOD RIGHT WRIST  Final   Special Requests   Final    BOTTLES DRAWN AEROBIC AND ANAEROBIC Blood Culture adequate volume   Culture   Final    NO GROWTH 4 DAYS Performed at [facility], [address], [place], [place] [address]    Report Status PENDING  Incomplete  SARS CORONAVIRUS 2 Nasal Swab Aptima Multi Swab     Status: None   Collection Time: 04/13/19  5:11 PM   Specimen: Aptima Multi Swab; Nasal Swab  Result Value Ref Range Status   SARS Coronavirus 2 NEGATIVE NEGATIVE Final    Comment: (NOTE) SARS-CoV-2  target nucleic acids are NOT DETECTED. The SARS-CoV-2 RNA is generally detectable in upper and lower respiratory specimens during the acute phase of infection. Negative results do not preclude SARS-CoV-2 infection, do not rule out co-infections with other pathogens, and should not be used as the sole basis for treatment or other patient management decisions. Negative results must be combined with clinical observations, patient history, and epidemiological information. The  expected result is Negative. Fact Sheet for Patients: SugarRoll.be Fact Sheet for Healthcare Providers: https://www.woods-mathews.com/ This test is not yet approved or cleared by the [place] FDA and  has been authorized for detection and/or diagnosis of SARS-CoV-2 by FDA under an Emergency Use Authorization (EUA). This EUA will remain  in effect (meaning this test can be used) for the duration of the COVID-19 declaration under Section 56 4(b)(1) of the Act, 21 U.S.C. section 360bbb-3(b)(1), unless the authorization is terminated or revoked sooner. Performed at [facility], [place] [address], [place], [place] [address]   Novel Coronavirus, NAA (hospital order; send-out to ref lab)     Status: None   Collection Time: 04/13/19  5:11 PM   Specimen: Nasopharyngeal Swab; Respiratory  Result Value Ref Range Status   SARS-CoV-2, NAA NOT DETECTED NOT DETECTED Final    Comment: (NOTE) This test was developed and its performance characteristics determined by [employer]. This test has not been FDA cleared or approved. This test has been authorized by FDA under an Emergency Use Authorization (EUA). This test is only authorized for the duration of time the declaration that circumstances exist justifying the authorization of the emergency use of in vitro diagnostic tests for detection of SARS-CoV-2 virus and/or diagnosis of COVID-19 infection under section 564(b)(1) of the Act, 21 U.S.C. 275TZG-0(F)(7), unless the authorization is terminated or revoked sooner. When diagnostic testing is negative, the possibility of a false negative result should be considered in the context of a patient's recent exposures and the presence of clinical signs and symptoms consistent with COVID-19. An individual without symptoms of COVID-19 and who is not shedding SARS-CoV-2 virus would expect to have a negative (not detected) result in this assay.  Performed  At: [facility] [address] [place], [place] [id number] Rush Farmer MD FM:[id number]    [place]  Final    Comment: Performed at [facility], [place] [address], [place], [place] [address]  Blood Culture (routine x 2)     Status: None (Preliminary result)   Collection Time: 04/13/19  5:15 PM   Specimen: BLOOD LEFT HAND  Result Value Ref Range Status   Specimen Description BLOOD LEFT HAND  Final   Special Requests   Final    BOTTLES DRAWN AEROBIC AND ANAEROBIC Blood Culture results may not be optimal due to an inadequate volume of blood received in culture bottles   Culture   Final    NO GROWTH 4 DAYS Performed at [facility], [place] [address] [place], [place] [address]    Report Status PENDING  Incomplete  MRSA PCR Screening     Status: None   Collection Time: 04/13/19 10:05 PM   Specimen: Nasopharyngeal  Result Value Ref Range Status   MRSA by PCR NEGATIVE NEGATIVE Final    Comment:        The GeneXpert MRSA Assay (FDA approved for NASAL specimens only), is one component of a comprehensive MRSA colonization surveillance program. It is not intended to diagnose MRSA infection nor to guide or monitor treatment for  MRSA infections. Performed at [facility], [place] [address], [place], [place] [address]   Respiratory Panel by PCR     Status: None   Collection Time: 04/14/19  3:16 AM   Specimen: Nasopharyngeal Swab; Respiratory  Result Value Ref Range Status   Adenovirus NOT DETECTED NOT DETECTED Final   Coronavirus 229E NOT DETECTED NOT DETECTED Final    Comment: (NOTE) The Coronavirus on the Respiratory Panel, DOES NOT test for the novel  Coronavirus (2019 nCoV)    Coronavirus HKU1 NOT DETECTED NOT DETECTED Final   Coronavirus NL63 NOT DETECTED NOT DETECTED Final   Coronavirus OC43 NOT DETECTED NOT DETECTED Final   Metapneumovirus NOT DETECTED NOT DETECTED Final   Rhinovirus / Enterovirus NOT DETECTED NOT  DETECTED Final   Influenza A NOT DETECTED NOT DETECTED Final   Influenza B NOT DETECTED NOT DETECTED Final   Parainfluenza Virus 1 NOT DETECTED NOT DETECTED Final   Parainfluenza Virus 2 NOT DETECTED NOT DETECTED Final   Parainfluenza Virus 3 NOT DETECTED NOT DETECTED Final   Parainfluenza Virus 4 NOT DETECTED NOT DETECTED Final   Respiratory Syncytial Virus NOT DETECTED NOT DETECTED Final   Bordetella pertussis NOT DETECTED NOT DETECTED Final   Chlamydophila pneumoniae NOT DETECTED NOT DETECTED Final   Mycoplasma pneumoniae NOT DETECTED NOT DETECTED Final    Comment: Performed at [facility], [place], [place], [place] [address]  Culture, respiratory (non-expectorated)     Status: None   Collection Time: 04/14/19  3:16 AM   Specimen: Tracheal Aspirate; Respiratory  Result Value Ref Range Status   Specimen Description TRACHEAL ASPIRATE  Final   Special Requests NONE  Final   Gram Stain   Final    MODERATE WBC PRESENT,BOTH PMN AND MONONUCLEAR NO ORGANISMS SEEN    Culture   Final    Consistent with normal respiratory flora. Performed at [facility], [place] [address], [place], [place] [address]    Report Status 04/16/2019 FINAL  Final         Radiology Studies: Dg Chest Port 1 View  Result Date: 04/16/2019 CLINICAL DATA:  Extubated EXAM: PORTABLE CHEST 1 VIEW COMPARISON:  Chest radiograph from one day prior. FINDINGS: Stable configuration of median sternotomy wires with discontinuity in the lower most wire. Stable cardiomediastinal silhouette with moderate cardiomegaly. No pneumothorax. No pleural effusion. Mild pulmonary edema appears improved. Interval extubation. IMPRESSION: Interval extubation.  Mild congestive heart failure is improved. Electronically Signed   By: Ilona Sorrel M.D.   On: 04/16/2019 08:08        Scheduled Meds: . amiodarone  100 mg Oral Daily  . apixaban  5 mg Oral BID  . arformoterol  15 mcg Nebulization BID  . carvedilol  12.5 mg  Oral BID WC  . chlorhexidine gluconate (MEDLINE KIT)  15 mL Mouth Rinse BID  . Chlorhexidine Gluconate Cloth  6 each Topical Daily  . docusate sodium  100 mg Oral Daily  . famotidine  20 mg Oral Daily  . insulin aspart  0-20 Units Subcutaneous TID WC  . insulin aspart  0-5 Units Subcutaneous QHS  . insulin aspart  5 Units Subcutaneous TID WC  . insulin glargine  45 Units Subcutaneous QHS  . isosorbide mononitrate  60 mg Oral Daily  . mouth rinse  15 mL Mouth Rinse q12n4p  . methylPREDNISolone (SOLU-MEDROL) injection  40 mg Intravenous Q12H  . polyethylene glycol  17 g Oral Daily  . potassium chloride  40  mEq Oral BID  . rosuvastatin  20 mg Oral q1800  . torsemide  60 mg Oral BID   Continuous Infusions: . sodium chloride 10 mL/hr at 04/16/19 0600  . cefTRIAXone (ROCEPHIN)  IV Stopped (04/16/19 1838)     LOS: 4 days    Time spent: 35 minutes    Barb Merino, MD [facility] Pager [phone]  If 7PM-7AM, please contact night-coverage [email or web address] Password [email or web address] 04/17/2019, 1:04 PM

## 2019-04-17 NOTE — Care Management (Signed)
Pt will not qualify for BIPAP based on ABG however per [employer] pt will qualify for Trilogy.  Attending to discuss DME with pt and will make referral to [employer] directly if equipment is desired.

## 2019-04-17 NOTE — Telephone Encounter (Signed)
Made aware pt hospitalized and will go home today. Will make another attempt to reach patient tomorrow

## 2019-04-17 NOTE — Consult Note (Signed)
   [facility] CM Inpatient Consult   04/17/2019  [name]. July 26, 1961 [id number]   Patient transitioned to [place].  Follow up noted from PT notes of patient recommended for Home Health care post hospital transition.  Patient will be followed in the HTA CSNP with Chronic Care Coordinator.   Patient could benefit from a Palliative Consult for symptom management and goals of care Outreached to inpatient [facility] team.  Natividad Brood, RN BSN [facility] Liaison  [phone] business mobile phone Toll free office [phone]  Fax number: [phone] [email or web address]

## 2019-04-17 NOTE — Progress Notes (Addendum)
Reviewed patient's previous hospitalization.  This is his third hospitalization in last 2 months with hypercapnia, CO2 more than 90 and somnolence.  He got intubated at this time. Patient and wife state that they were never had any sleep study done. Patient obviously has large body habitus, recurrent hypercapnia.  There is diagnosis of obesity hypoventilation syndrome and sleep apnea all over the records.  I could not find any sleep studies.  Plan: I called and discussed case with cardiology team.  Patient has been seen by heart failure clinic.  Suggested referral for a sleep study and/or arrangement for noninvasive ventilator.  Discussed with [employer] home care, will arrange for Trilogy at home.  DME supplier respiratory therapist can come home on Friday or next Monday to teach patient about use of the machine and troubleshoot's. Patient has a resistant UTI, may stay here until Saturday to finish antibiotics. Anticipate discharge home Sunday.

## 2019-04-17 NOTE — Progress Notes (Signed)
Physical Therapy Treatment Patient Details Name: [name]. MRN: [id number] DOB: 03/04/1961 Today's Date: 04/17/2019    History of Present Illness Pt is a 58 year old man admitted 04/13/19 with SOB and hypoxia requiring intubation. Extubated 8/25. PMH: morbid obesity, OSA, chronic respiratory failure with hypoxia and hypercapnia, DM, afib, CAD, failed CABG, CKD 4, CHF, CVA, acoustic neuroma. This is pt's 3rd admission in 6 months.    PT Comments    Pt progressing with mobility. Expect he isn't far from his baseline.    Follow Up Recommendations  Home health PT;Supervision for mobility/OOB     Equipment Recommendations  None recommended by PT    Recommendations for Other Services       Precautions / Restrictions Precautions Precautions: Fall Precaution Comments: blind in R eye Restrictions Weight Bearing Restrictions: No    Mobility  Bed Mobility Overal bed mobility: Needs Assistance Bed Mobility: Supine to Sit     Supine to sit: Supervision;HOB elevated     General bed mobility comments: supervision for safety and lines, HOB up  Transfers Overall transfer level: Needs assistance Equipment used: 4-wheeled walker Transfers: Sit to/from Stand Sit to Stand: Min guard         General transfer comment: Assist for safety. Pt pulls up on handles of rollator. Incr time and effort to stand  Ambulation/Gait Ambulation/Gait assistance: Min guard;+2 safety/equipment Gait Distance (Feet): 60 Feet Assistive device: 4-wheeled walker;1 person hand held assist Gait Pattern/deviations: Step-through pattern;Decreased step length - right;Decreased step length - left;Trunk flexed;Wide base of support Gait velocity: decr Gait velocity interpretation: <1.31 ft/sec, indicative of household ambulator General Gait Details: Assist for lines/safety   Stairs             Wheelchair Mobility    Modified Rankin (Stroke Patients Only)       Balance Overall  balance assessment: Needs assistance Sitting-balance support: No upper extremity supported;Feet supported Sitting balance-Leahy Scale: Fair     Standing balance support: Bilateral upper extremity supported Standing balance-Leahy Scale: Poor Standing balance comment: UE support and supervision for static standing                            Cognition Arousal/Alertness: Awake/alert Behavior During Therapy: Flat affect Overall Cognitive Status: Impaired/Different from baseline Area of Impairment: Orientation;Attention;Memory;Following commands;Safety/judgement;Awareness;Problem solving                 Orientation Level: Disoriented to;Situation;Time Current Attention Level: Sustained Memory: Decreased short-term memory Following Commands: Follows one step commands with increased time(and multimodal cues) Safety/Judgement: Decreased awareness of safety;Decreased awareness of deficits Awareness: Intellectual Problem Solving: Slow processing;Decreased initiation;Difficulty sequencing;Requires verbal cues;Requires tactile cues General Comments: Suspect pt has baseline deficits      Exercises      General Comments General comments (skin integrity, edema, etc.): Pt with HR to 170 with amb      Pertinent Vitals/Pain Pain Assessment: No/denies pain    Home Living                      Prior Function            PT Goals (current goals can now be found in the care plan section) Acute Rehab PT Goals Patient Stated Goal: to have a BM on the toilet Progress towards PT goals: Progressing toward goals    Frequency    Min 3X/week      PT Plan Current plan  remains appropriate    Co-evaluation              AM-PAC PT "6 Clicks" Mobility   Outcome Measure  Help needed turning from your back to your side while in a flat bed without using bedrails?: A Little Help needed moving from lying on your back to sitting on the side of a flat bed without  using bedrails?: A Little Help needed moving to and from a bed to a chair (including a wheelchair)?: A Little Help needed standing up from a chair using your arms (e.g., wheelchair or bedside chair)?: A Little Help needed to walk in hospital room?: A Little Help needed climbing 3-5 steps with a railing? : A Lot 6 Click Score: 17    End of Session Equipment Utilized During Treatment: Oxygen Activity Tolerance: Patient tolerated treatment well Patient left: in chair;with call bell/phone within reach;with chair alarm set Nurse Communication: Mobility status PT Visit Diagnosis: Muscle weakness (generalized) (M62.81);Difficulty in walking, not elsewhere classified (R26.2)     Time: 1791-5056 PT Time Calculation (min) (ACUTE ONLY): 26 min  Charges:  $Gait Training: 23-37 mins                     [place] Pager [phone] Office [place] 04/17/2019, 5:14 PM

## 2019-04-17 NOTE — Telephone Encounter (Signed)
Per Dr Aundra Dubin, pt to be arranged for a sleep study.  Called patient on both lines on file, unable to leave a message. Will make another attempt in the morning.

## 2019-04-17 NOTE — Patient Outreach (Signed)
  [facility]) Care Management Chronic Special Needs Program   04/17/2019  Name: Frank Stieg., DOB: 09-11-1960  MRN: [id number]  The client was discussed in today's interdisciplinary care team meeting.  The following issues were discussed:  Client's needs, Changes in health status, Coordination of care and Care transitions  Participants present:  Natividad Brood, RNCM(hospital liaison); Thea Silversmith, [facility]  Client remains in hospital at this time.  Plan: RNCM will continue to follow as client's chronic care management coordinator.  Thea Silversmith, RN, MSN, [place] [phone]

## 2019-04-17 NOTE — Progress Notes (Signed)
@   [place] per MD. No new orders at this time.  Will pass along vitals schedule to night shift.

## 2019-04-17 NOTE — Progress Notes (Signed)
Inpatient Diabetes Program Recommendations  AACE/ADA: New Consensus Statement on Inpatient Glycemic Control (2015)  Target Ranges:  Prepandial:   less than 140 mg/dL      Peak postprandial:   less than 180 mg/dL (1-2 hours)      Critically ill patients:  140 - 180 mg/dL   Results for LASH, MATULICH (MRN [id number]) as of 04/17/2019 08:12  Ref. Range 04/16/2019 00:01 04/16/2019 04:04 04/16/2019 07:15 04/16/2019 11:16 04/16/2019 16:38 04/16/2019 18:00 04/16/2019 21:13  Glucose-Capillary Latest Ref Range: 70 - 99 mg/dL 271 (H)  11 units NOVOLOG  256 (H)  11 units NOVOLOG  227 (H)  7 units NOVOLOG  284 (H)  16 units NOVOLOG  231 (H)    262 (H)  16 units NOVOLOG 227 (H)  2 units NOVOLOG +  45 units LANTUS   Results for QUINT, CHESTNUT (MRN [id number]) as of 04/17/2019 08:12  Ref. Range 04/17/2019 07:38  Glucose-Capillary Latest Ref Range: 70 - 99 mg/dL 242 (H)  12 units NOVOLOG      Home DM Meds: Lantus 45 units QHS   Current Orders: Lantus 45 units QHS      Novolog Resistant Correction Scale/ SSI (0-20 units) TID AC + HS      Novolog 5 units TID with meals     Note patient Extubated 08/25 AM.  Getting Solumedrol 40 mg BID.     MD- Please consider the following in-hospital insulin adjustments:  1. Increase Lantus to 55 units QHS (20% increase)   2. Increase Novolog Meal Coverage to: Novolog 8 units TID with meals  (Please add the following Hold Parameters: Hold if pt eats <50% of meal, Hold if pt NPO)      --Will follow patient during hospitalization--  Wyn Quaker RN, MSN, CDE Diabetes Coordinator Inpatient Glycemic Control Team Team Pager: [phone] (8a-5p)

## 2019-04-18 LAB — CBC WITH DIFFERENTIAL/PLATELET
Abs Immature Granulocytes: 0.07 K/uL (ref 0.00–0.07)
Basophils Absolute: 0 K/uL (ref 0.0–0.1)
Basophils Relative: 0 %
Eosinophils Absolute: 0.2 K/uL (ref 0.0–0.5)
Eosinophils Relative: 1 %
HCT: 47 % (ref 39.0–52.0)
Hemoglobin: 14.7 g/dL (ref 13.0–17.0)
Immature Granulocytes: 1 %
Lymphocytes Relative: 12 %
Lymphs Abs: 1.7 K/uL (ref 0.7–4.0)
MCH: 29.5 pg (ref 26.0–34.0)
MCHC: 31.3 g/dL (ref 30.0–36.0)
MCV: 94.4 fL (ref 80.0–100.0)
Monocytes Absolute: 2 K/uL — ABNORMAL HIGH (ref 0.1–1.0)
Monocytes Relative: 14 %
Neutro Abs: 10.9 K/uL — ABNORMAL HIGH (ref 1.7–7.7)
Neutrophils Relative %: 72 %
Platelets: 257 K/uL (ref 150–400)
RBC: 4.98 MIL/uL (ref 4.22–5.81)
RDW: 13.2 % (ref 11.5–15.5)
WBC: 14.9 K/uL — ABNORMAL HIGH (ref 4.0–10.5)
nRBC: 0 % (ref 0.0–0.2)

## 2019-04-18 LAB — BASIC METABOLIC PANEL WITH GFR
Anion gap: 17 — ABNORMAL HIGH (ref 5–15)
BUN: 91 mg/dL — ABNORMAL HIGH (ref 6–20)
CO2: 34 mmol/L — ABNORMAL HIGH (ref 22–32)
Calcium: 9.4 mg/dL (ref 8.9–10.3)
Chloride: 86 mmol/L — ABNORMAL LOW (ref 98–111)
Creatinine, Ser: 3.09 mg/dL — ABNORMAL HIGH (ref 0.61–1.24)
GFR calc Af Amer: 24 mL/min — ABNORMAL LOW
GFR calc non Af Amer: 21 mL/min — ABNORMAL LOW
Glucose, Bld: 207 mg/dL — ABNORMAL HIGH (ref 70–99)
Potassium: 3.3 mmol/L — ABNORMAL LOW (ref 3.5–5.1)
Sodium: 137 mmol/L (ref 135–145)

## 2019-04-18 LAB — GLUCOSE, CAPILLARY
Glucose-Capillary: 198 mg/dL — ABNORMAL HIGH (ref 70–99)
Glucose-Capillary: 248 mg/dL — ABNORMAL HIGH (ref 70–99)
Glucose-Capillary: 254 mg/dL — ABNORMAL HIGH (ref 70–99)
Glucose-Capillary: 217 mg/dL — ABNORMAL HIGH (ref 70–99)

## 2019-04-18 LAB — CULTURE, BLOOD (ROUTINE X 2)
Culture: NO GROWTH
Culture: NO GROWTH
Special Requests: ADEQUATE

## 2019-04-18 LAB — PHOSPHORUS: Phosphorus: 3.7 mg/dL (ref 2.5–4.6)

## 2019-04-18 LAB — MAGNESIUM: Magnesium: 2.3 mg/dL (ref 1.7–2.4)

## 2019-04-18 MED ORDER — HYDROCODONE-ACETAMINOPHEN 10-325 MG PO TABS
1.0000 | ORAL_TABLET | Freq: Four times a day (QID) | ORAL | Status: DC | PRN
  Administered 2019-04-18 – 2019-04-24 (×22): 1 via ORAL
  Filled 2019-04-18 (×22): qty 1

## 2019-04-18 NOTE — Progress Notes (Addendum)
Patient will benefit with noninvasive ventilator use at home during nighttime and as needed in the daytime.  Patient continues to exhibit signs of hypercapnia associated with chronic respiratory failure secondary to COPD.  Patient requires the use of noninvasive ventilator at bedtime and daytime with naps to help with exacerbation.  The use of noninvasive ventilator will treat patient's high CO2 levels and can reduce the risk of exacerbations, future hospitalizations when used at night and during the day.  Patient will need these advance settings in conjunction with current medication regimen, BiPAP is not an option due to its functional limitations and the severity of patient's condition.  Failure to use noninvasive ventilator will cause further deterioration, recurrent hospitalization and risk of intubation and respiratory arrest.  Patient is able to protect her airways and clear secretions on their own.

## 2019-04-18 NOTE — Care Management Important Message (Signed)
Important Message  Patient Details  Name: [name]. MRN: [id number] Date of Birth: 1961/01/06   Medicare Important Message Given:  Yes     Meisha Salone 04/18/2019, 9:54 AM

## 2019-04-18 NOTE — Telephone Encounter (Signed)
Called patient on both numbers on file. Unable to leave message because no voicemail set up. Pt needs home sleep study arranged

## 2019-04-18 NOTE — Progress Notes (Signed)
Occupational Therapy Treatment Patient Details Name: [name]. MRN: [id number] DOB: [birth year] Today's Date: 04/18/2019    History of present illness Pt is a 58 year old man admitted 04/13/19 with SOB and hypoxia requiring intubation. Extubated 8/25. PMH: morbid obesity, OSA, chronic respiratory failure with hypoxia and hypercapnia, DM, afib, CAD, failed CABG, CKD 4, CHF, CVA, acoustic neuroma. This is pt's 3rd admission in 6 months.   OT comments  Pt. Seen for skilled OT session.  Able to complete bed mobility and ambulation to b.room for toileting tasks.  Reports having necessary A/E for hygiene and peri care at home.    Follow Up Recommendations  Home health OT;Supervision/Assistance - 24 hour    Equipment Recommendations  None recommended by OT    Recommendations for Other Services      Precautions / Restrictions Precautions Precautions: Fall Precaution Comments: blind in R eye       Mobility Bed Mobility Overal bed mobility: Needs Assistance Bed Mobility: Supine to Sit     Supine to sit: Supervision;HOB elevated     General bed mobility comments: supervision for safety and lines, HOB up  Transfers Overall transfer level: Needs assistance Equipment used: 4-wheeled walker Transfers: Sit to/from [facility] Sit to Stand: Min guard Stand pivot transfers: Min guard       General transfer comment: Assist for safety. Pt pulls up on handles of rollator. Incr time and effort to stand, turned and ambulated into b.room backwards. when reviewed forward is more safe, he states "this is how i do it here, go grab me my ginger ale"    Balance                                           ADL either performed or assessed with clinical judgement   ADL Overall ADL's : Needs assistance/impaired                       Lower Body Dressing Details (indicate cue type and reason): encouraged pt. to don socks seated eob, provided  strategies to reach LEs. pt. declined attempts "i dont wear socks, i wear slippers". Toilet Transfer: Min guard;Ambulation;Regular Toilet;RW(rollator)     Toileting - Clothing Manipulation Details (indicate cue type and reason): pt. left on commode with inst. for pull string for CNA.  did not observe hygiene/peri care but pt. reports "i have a butt wiper at home" and described use, so will likely need higher level assistance here without A/E     Functional mobility during ADLs: Min guard;Rolling walker General ADL Comments: encouragement to initiate ADLS, pt. has reasons for why he doesnt need to or can not. however, overall he moves well.  also initiated rest breaks appropriately throughout.     Vision       Perception     Praxis      Cognition Arousal/Alertness: Awake/alert Behavior During Therapy: [facility] for tasks assessed/performed                                            Exercises     Shoulder Instructions       General Comments      Pertinent Vitals/ Pain       Pain Assessment: Faces  Faces Pain Scale: Hurts a little bit Pain Location: back Pain Descriptors / Indicators: Aching Pain Intervention(s): Limited activity within patient's tolerance;Monitored during session;Repositioned  Home Living                                          Prior Functioning/Environment              Frequency  Min 2X/week        Progress Toward Goals  OT Goals(current goals can now be found in the care plan section)        Plan Discharge plan remains appropriate    Co-evaluation                 AM-PAC OT "6 Clicks" Daily Activity     Outcome Measure   Help from another person eating meals?: None Help from another person taking care of personal grooming?: A Little Help from another person toileting, which includes using toliet, bedpan, or urinal?: Total Help from another person bathing (including washing, rinsing, drying)?:  A Lot Help from another person to put on and taking off regular upper body clothing?: A Little Help from another person to put on and taking off regular lower body clothing?: A Lot 6 Click Score: 15    End of Session Equipment Utilized During Treatment: Rolling walker;Oxygen;Gait belt  OT Visit Diagnosis: Unsteadiness on feet (R26.81);Other abnormalities of gait and mobility (R26.89);Other symptoms and signs involving cognitive function   Activity Tolerance Patient tolerated treatment well   Patient Left Other (comment)(left in b.room on commode, CNA aware we both reviewed with him to pull string when finished. also reviewed pt. ambulation and transfers with RN)   Nurse Communication Mobility status;Other (comment)(ambulation status reviewed and also spoke with CNA pt. on commode advised to pull string when finished)        Time: 0254-2706 OT Time Calculation (min): 23 min  Charges: OT General Charges $OT Visit: 1 Visit OT Treatments $Self Care/Home Management : 23-37 mins  Janice Coffin, COTA/L 04/18/2019, 9:42 AM

## 2019-04-18 NOTE — Progress Notes (Signed)
Inpatient Diabetes Program Recommendations  AACE/ADA: New Consensus Statement on Inpatient Glycemic Control (2015)  Target Ranges:  Prepandial:   less than 140 mg/dL      Peak postprandial:   less than 180 mg/dL (1-2 hours)      Critically ill patients:  140 - 180 mg/dL   Lab Results  Component Value Date   GLUCAP 248 (H) 04/18/2019   HGBA1C 7.8 (H) 04/14/2019    Review of Glycemic Control Results for [name], [name] (MRN [id number]) as of 04/18/2019 13:14  Ref. Range 04/17/2019 16:44 04/17/2019 20:57 04/18/2019 07:33 04/18/2019 11:35  Glucose-Capillary Latest Ref Range: 70 - 99 mg/dL 206 (H) 352 (H) 198 (H) 248 (H)  Home DM Meds: Lantus 45 units QHS   Current Orders: Lantus 45 units QHS                            Novolog Resistant Correction Scale/ SSI (0-20 units) TID AC + HS                            Novolog 5 units TID with meals  Inpatient Diabetes Program Recommendations:    Consider increasing Novolog meal coverage to 8 units tid with meals.  Note steroids stopped.   Thanks  Adah Perl, RN, BC-ADM Inpatient Diabetes Coordinator Pager [phone] (8a-5p)

## 2019-04-18 NOTE — TOC Progression Note (Addendum)
Transition of Care (TOC) - Progression Note    Patient Details  Name: Kalil Woessner. MRN: [id number] Date of Birth: May 20, 1961  Transition of Care [facility]) CM/SW Contact  Maryclare Labrador, RN Phone Number: 04/18/2019, 11:43 AM  Clinical Narrative:   Per attending pt will discharge home on Sunday - [employer] will set up [facility] in the home on Monday.  Pt in agreement with trilogy provided by [employer] - medicare.gov DME choice provided.  Pt is on home oxygen 3 liters supplied by [employer] - wife will bring portable tank to hospital and transport pt home via private vehicle.  CM contacted [facility] and informed agency that pt is a tentative discharge home on Sunday.  Pt will request a follow up appt from PCP directly.  [facility] informed CM that they never accepted pt and are unable to accept pt at current time.  [facility] accepted pt and pt is in agreement with agency - agency aware of the above plan including DME and tentative discharge date.           Expected Discharge Plan and Services                                                 Social Determinants of Health (SDOH) Interventions    Readmission Risk Interventions Readmission Risk Prevention Plan 02/24/2019  Transportation Screening Complete  PCP or Specialist Appt within 3-5 Days Not Complete  Not Complete comments see above  [place] or [place] Complete  Social Work Consult for [place] Planning/Counseling Complete  Palliative Care Screening Not Applicable  Medication Review Press photographer) Complete  Some recent data might be hidden

## 2019-04-18 NOTE — Progress Notes (Signed)
PROGRESS NOTE    [name].  XTK:[id number] DOB: 02/24/1961 DOA: 04/13/2019 PCP: Caren Macadam, MD    Brief Narrative:  58 year old morbidly obese man with history of?  Obstructive sleep apnea, obesity hypoventilation, not on any CPAP or BiPAP at home, chronic respiratory failure with hypoxia and hypercapnia, IDDM, coronary artery disease status post prior CABG, CKD stage IV and diastolic congestive heart failure brought to the emergency room where he was found very somnolent.  Patient on 3 L of oxygen at home.  In the emergency room oxygen was 80, tidal CO2 77, bilateral wheezing with diminished sounds.  ABG showed pH 7.18, CO2 more than 97.  Started on high flow nasal cannula, became somnolent and unresponsive.  He was intubated and admitted to the ICU. Previous multiple admissions for altered mental status and hypercapnia.  Recent discharge on 8/6. Intubated, 04/13/2019-04/15/2019  Assessment & Plan:   Active Problems:   Obesity hypoventilation syndrome (HCC)   CKD stage 3 secondary to diabetes (HCC)   IDDM (insulin dependent diabetes mellitus) (HCC)   Chronic obstructive pulmonary disease (HCC)   Acute on chronic diastolic heart failure (HCC)  Acute on chronic respiratory failure with hypoxia and hypercapnia in the setting of untreated sleep apnea and obesity hypoventilation and sepsis: Mechanical ventilation, 8/23- 8/25 On nasal cannula oxygen, keep on oxygen to keep saturation more than 90%. BiPAP PRN.  Patient does not like it. Continue bronchodilator therapy.  Stop the steroids. Respiratory therapy and chest physiotherapy. Recurrent encephalopathy probably due to hypercapnia, presentation CO2 more than 97.  Patient will benefit with noninvasive ventilator, referral made and necessary prescriptions provided.  Educated to the patient and wife, home health to schedule delivery and set up after Treology machine at home on 8/31 Monday.  Patient stated that he will  use it once he learns how to use it.  Acute metabolic encephalopathy: Probably due to hypercapnia aggravated by use of narcotics.  Also might have been related to UTI with sepsis.  Improved.  Acute UTI present on admission with sepsis: Blood cultures negative.  Urine cultures with multidrug-resistant Proteus Penneri.  Sensitive to ceftriaxone.  Day 4/5 of ceftriaxone.  Continue.  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  Off steroids now.    Chronic diastolic heart failure: Currently euvolemic.  Continue Imdur, Coreg.  Imdur dose increased.  He is euvolemic.  Torsemide and potassium supplement resume.  Paroxysmal A. fib: Currently sinus rhythm.  Patient on amiodarone.  Therapeutic on Eliquis.  Occasional RVR, asymptomatic.  AKI on CKD stage IV: Baseline creatinine about 2.3.  Aggravated due to #1.  Gradually improving.  Continue to monitor.  Hypokalemia: Replace and monitor levels.  Magnesium and phosphorus normalized.   DVT prophylaxis: Eliquis Code Status: Full code Family Communication: Discussed with patient and wife at the bedside on 04/17/2019 Disposition Plan: Home with home health PT OT. Patient will finish IV antibiotic therapy tomorrow, may be discharged home and to be seen by DME supplier on Monday.   Consultants:   PCCM signed off  Procedures:   None  Antimicrobials:  Vancomycin  8/23 >>8/24 Cefepime      8/23 >>8/26 Metronidazole 8/23>>8/26 Rocephin 8/26>>> last dose on 8/29  Micro 04/13/2019 >SARSCOV2 aptima negative  MRSA surveillance > negative 8/23 Urine > 80,000 Colonies of Proteus Penneri >>  multidrug-resistant.  Sensitive to ceftriaxone. 8/23 Blood>> 8/26 NGTD 8/24: tracheal aspirate >> Usual Oral Flora 8/24: RVP >  negative  Subjective:  Patient seen and examined.  His main concern is not getting his 10 mg of hydrocodone since admission.  He has a lot of pain on his right face.   No other overnight events.  Occasional tachycardia on ambulation. He is agreeable to use NIV at home if that helps with his breathing.  Objective: Vitals:   04/17/19 2200 04/17/19 2318 04/18/19 0500 04/18/19 0732  BP: 119/78 119/69  127/83  Pulse: 71 68  (!) 55  Resp: 16 18    Temp: 98.3 F (36.8 C) 98.5 F (36.9 C)  98.8 F (37.1 C)  TempSrc:  Oral  Oral  SpO2: 97% 98%  95%  Weight:   (!) 160.9 kg   Height:        Intake/Output Summary (Last 24 hours) at 04/18/2019 1120 Last data filed at 04/18/2019 0800 Gross per 24 hour  Intake 1260 ml  Output 6000 ml  Net -4740 ml   Filed Weights   04/16/19 1519 04/17/19 0408 04/18/19 0500  Weight: (!) 165.1 kg (!) 164.2 kg (!) 160.9 kg    Examination:  General exam: Appears calm and comfortable, chronically sick looking, morbidly obese, on 3 L oxygen. Respiratory system: Clear to auscultation. Respiratory effort normal.  Bilateral poor air entry and conducted airway sounds Cardiovascular system: S1 & S2 heard, RRR. No JVD, murmurs, rubs, gallops or clicks.  Bilateral obese legs.  1+ edema.. Gastrointestinal system: Abdomen is distended but nontender.soft and nontender. No organomegaly or masses felt. Normal bowel sounds heard. Central nervous system: Alert and oriented.  Chronic right facial droop.  No focal neurological deficits.  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 Extremities: Symmetric 5 x 5 power.  Generalized weakness. Skin: No rashes, lesions or ulcers, excoriations along the perineum with moisture. Psychiatry: Judgement and insight appear normal. Mood & affect appropriate.     Data Reviewed: I have personally reviewed following labs and imaging studies  CBC: Recent Labs  Lab 04/13/19 1623  04/14/19 0437 04/15/19 0313 04/16/19 0308 04/16/19 0354 04/17/19 0357 04/18/19 0721  WBC 9.1  --  8.0 14.2* 14.9*  --  10.7* 14.9*  NEUTROABS 6.8  --   --   --   --   --   --  10.9*  HGB 12.0*   < > 11.4* 11.6*  12.0* 13.9 11.9* 14.7  HCT 42.2   < > 39.7 39.3 39.3 41.0 40.0 47.0  MCV 104.7*  --  101.8* 99.5 97.8  --  98.3 94.4  PLT 208  --  214 230 256  --  199 257   < > = values in this interval not displayed.   Basic Metabolic Panel: Recent Labs  Lab 04/13/19 2017  04/14/19 0437 04/15/19 0313 04/16/19 0308 04/16/19 0354 04/17/19 0357 04/18/19 0721  NA  --    < > 144 141 141 140 139 137  K  --    < > 4.4 3.0* 2.8* 2.9* 3.2* 3.3*  CL  --   --  98 93* 87*  --  89* 86*  CO2  --   --  29 33* 36*  --  34* 34*  GLUCOSE  --   --  228* 303* 273*  --  238* 207*  BUN  --   --  38* 61* 81*  --  85* 91*  CREATININE  --   --  2.72* 3.32* 3.50*  --  3.12* 3.09*  CALCIUM  --   --  9.2  9.3 8.9  --  9.0 9.4  MG 2.1  --  2.0  --  2.3  --   --  2.3  PHOS 4.1  --  <1.0* 3.0 4.0  4.1  --  4.8* 3.7   < > = values in this interval not displayed.   GFR: Estimated Creatinine Clearance: 40.4 mL/min (A) (by C-G formula based on SCr of 3.09 mg/dL (H)). Liver Function Tests: Recent Labs  Lab 04/13/19 1623 04/15/19 0313 04/16/19 0308 04/17/19 0357  AST 22  --   --   --   ALT 27  --   --   --   ALKPHOS 289*  --   --   --   BILITOT 0.7  --   --   --   PROT 7.7  --   --   --   ALBUMIN 3.4* 3.2* 3.2* 3.1*   No results for input(s): LIPASE, AMYLASE in the last 168 hours. No results for input(s): AMMONIA in the last 168 hours. Coagulation Profile: Recent Labs  Lab 04/13/19 1623  INR 1.3*   Cardiac Enzymes: No results for input(s): CKTOTAL, CKMB, CKMBINDEX, TROPONINI in the last 168 hours. BNP (last 3 results) No results for input(s): PROBNP in the last 8760 hours. HbA1C: No results for input(s): HGBA1C in the last 72 hours. CBG: Recent Labs  Lab 04/17/19 0738 04/17/19 1145 04/17/19 1644 04/17/19 2057 04/18/19 0733  GLUCAP 242* 206* 206* 352* 198*   Lipid Profile: No results for input(s): CHOL, HDL, LDLCALC, TRIG, CHOLHDL, LDLDIRECT in the last 72 hours. Thyroid Function Tests: No results  for input(s): TSH, T4TOTAL, FREET4, T3FREE, THYROIDAB in the last 72 hours. Anemia Panel: No results for input(s): VITAMINB12, FOLATE, FERRITIN, TIBC, IRON, RETICCTPCT in the last 72 hours. Sepsis Labs: Recent Labs  Lab 04/13/19 1623 04/13/19 2017  PROCALCITON  --  0.20  LATICACIDVEN 1.1 1.2    Recent Results (from the past 240 hour(s))  Urine culture     Status: Abnormal   Collection Time: 04/13/19  4:44 PM   Specimen: In/Out Cath Urine  Result Value Ref Range Status   Specimen Description IN/OUT CATH URINE  Final   Special Requests   Final    NONE Performed at [facility], [address], [place], [place] [address]    Culture 80,000 COLONIES/mL PROTEUS PENNERI (A)  Final   Report Status 04/16/2019 FINAL  Final   Organism ID, Bacteria PROTEUS PENNERI (A)  Final      Susceptibility   Proteus penneri - MIC*    AMPICILLIN >=32 RESISTANT Resistant     CEFAZOLIN >=64 RESISTANT Resistant     CEFTRIAXONE <=1 SENSITIVE Sensitive     CIPROFLOXACIN >=4 RESISTANT Resistant     GENTAMICIN <=1 SENSITIVE Sensitive     IMIPENEM 0.5 SENSITIVE Sensitive     NITROFURANTOIN 128 RESISTANT Resistant     TRIMETH/SULFA <=20 SENSITIVE Sensitive     AMPICILLIN/SULBACTAM 16 INTERMEDIATE Intermediate     PIP/TAZO <=4 SENSITIVE Sensitive     * 80,000 COLONIES/mL PROTEUS PENNERI  Blood Culture (routine x 2)     Status: None (Preliminary result)   Collection Time: 04/13/19  4:45 PM   Specimen: BLOOD RIGHT WRIST  Result Value Ref Range Status   Specimen Description BLOOD RIGHT WRIST  Final   Special Requests   Final    BOTTLES DRAWN AEROBIC AND ANAEROBIC Blood Culture adequate volume   Culture   Final    NO GROWTH 4 DAYS  Performed at [facility], [place] [address] [place], [place] [address]    Report Status PENDING  Incomplete  SARS CORONAVIRUS 2 Nasal Swab Aptima Multi Swab     Status: None   Collection Time: 04/13/19  5:11 PM   Specimen: Aptima Multi Swab; Nasal Swab  Result  Value Ref Range Status   SARS Coronavirus 2 NEGATIVE NEGATIVE Final    Comment: (NOTE) SARS-CoV-2 target nucleic acids are NOT DETECTED. The SARS-CoV-2 RNA is generally detectable in upper and lower respiratory specimens during the acute phase of infection. Negative results do not preclude SARS-CoV-2 infection, do not rule out co-infections with other pathogens, and should not be used as the sole basis for treatment or other patient management decisions. Negative results must be combined with clinical observations, patient history, and epidemiological information. The expected result is Negative. Fact Sheet for Patients: SugarRoll.be Fact Sheet for Healthcare Providers: https://www.woods-mathews.com/ This test is not yet approved or cleared by the [place] FDA and  has been authorized for detection and/or diagnosis of SARS-CoV-2 by FDA under an Emergency Use Authorization (EUA). This EUA will remain  in effect (meaning this test can be used) for the duration of the COVID-19 declaration under Section 56 4(b)(1) of the Act, 21 U.S.C. section 360bbb-3(b)(1), unless the authorization is terminated or revoked sooner. Performed at [facility], [place] [address], [place], [place] [address]   Novel Coronavirus, NAA (hospital order; send-out to ref lab)     Status: None   Collection Time: 04/13/19  5:11 PM   Specimen: Nasopharyngeal Swab; Respiratory  Result Value Ref Range Status   SARS-CoV-2, NAA NOT DETECTED NOT DETECTED Final    Comment: (NOTE) This test was developed and its performance characteristics determined by [employer]. This test has not been FDA cleared or approved. This test has been authorized by FDA under an Emergency Use Authorization (EUA). This test is only authorized for the duration of time the declaration that circumstances exist justifying the authorization of the emergency use of in vitro diagnostic  tests for detection of SARS-CoV-2 virus and/or diagnosis of COVID-19 infection under section 564(b)(1) of the Act, 21 U.S.C. 876OTL-5(B)(2), unless the authorization is terminated or revoked sooner. When diagnostic testing is negative, the possibility of a false negative result should be considered in the context of a patient's recent exposures and the presence of clinical signs and symptoms consistent with COVID-19. An individual without symptoms of COVID-19 and who is not shedding SARS-CoV-2 virus would expect to have a negative (not detected) result in this assay. Performed  At: [facility] [address] [place], [place] [id number] Rush Farmer MD BU:[id number]    [place]  Final    Comment: Performed at [facility], [place] [address], [place], [place] [address]  Blood Culture (routine x 2)     Status: None (Preliminary result)   Collection Time: 04/13/19  5:15 PM   Specimen: BLOOD LEFT HAND  Result Value Ref Range Status   Specimen Description BLOOD LEFT HAND  Final   Special Requests   Final    BOTTLES DRAWN AEROBIC AND ANAEROBIC Blood Culture results may not be optimal due to an inadequate volume of blood received in culture bottles   Culture   Final    NO GROWTH 4 DAYS Performed at [facility], [place] [address] [place],  [address]    Report Status PENDING  Incomplete  MRSA PCR Screening     Status:  None   Collection Time: 04/13/19 10:05 PM   Specimen: Nasopharyngeal  Result Value Ref Range Status   MRSA by PCR NEGATIVE NEGATIVE Final    Comment:        The GeneXpert MRSA Assay (FDA approved for NASAL specimens only), is one component of a comprehensive MRSA colonization surveillance program. It is not intended to diagnose MRSA infection nor to guide or monitor treatment for MRSA infections. Performed at [facility], [place] [address], [place], [place] [address]   Respiratory Panel by PCR     Status: None    Collection Time: 04/14/19  3:16 AM   Specimen: Nasopharyngeal Swab; Respiratory  Result Value Ref Range Status   Adenovirus NOT DETECTED NOT DETECTED Final   Coronavirus 229E NOT DETECTED NOT DETECTED Final    Comment: (NOTE) The Coronavirus on the Respiratory Panel, DOES NOT test for the novel  Coronavirus (2019 nCoV)    Coronavirus HKU1 NOT DETECTED NOT DETECTED Final   Coronavirus NL63 NOT DETECTED NOT DETECTED Final   Coronavirus OC43 NOT DETECTED NOT DETECTED Final   Metapneumovirus NOT DETECTED NOT DETECTED Final   Rhinovirus / Enterovirus NOT DETECTED NOT DETECTED Final   Influenza A NOT DETECTED NOT DETECTED Final   Influenza B NOT DETECTED NOT DETECTED Final   Parainfluenza Virus 1 NOT DETECTED NOT DETECTED Final   Parainfluenza Virus 2 NOT DETECTED NOT DETECTED Final   Parainfluenza Virus 3 NOT DETECTED NOT DETECTED Final   Parainfluenza Virus 4 NOT DETECTED NOT DETECTED Final   Respiratory Syncytial Virus NOT DETECTED NOT DETECTED Final   Bordetella pertussis NOT DETECTED NOT DETECTED Final   Chlamydophila pneumoniae NOT DETECTED NOT DETECTED Final   Mycoplasma pneumoniae NOT DETECTED NOT DETECTED Final    Comment: Performed at [facility], [place]. [address], [place], [place] [address]  Culture, respiratory (non-expectorated)     Status: None   Collection Time: 04/14/19  3:16 AM   Specimen: Tracheal Aspirate; Respiratory  Result Value Ref Range Status   Specimen Description TRACHEAL ASPIRATE  Final   Special Requests NONE  Final   Gram Stain   Final    MODERATE WBC PRESENT,BOTH PMN AND MONONUCLEAR NO ORGANISMS SEEN    Culture   Final    Consistent with normal respiratory flora. Performed at [facility], [place] [address], [place], [place] [address]    Report Status 04/16/2019 FINAL  Final         Radiology Studies: No results found.      Scheduled Meds: . amiodarone  100 mg Oral Daily  . apixaban  5 mg Oral BID  . arformoterol  15 mcg  Nebulization BID  . carvedilol  12.5 mg Oral BID WC  . chlorhexidine gluconate (MEDLINE KIT)  15 mL Mouth Rinse BID  . Chlorhexidine Gluconate Cloth  6 each Topical Daily  . docusate sodium  100 mg Oral Daily  . famotidine  20 mg Oral Daily  . insulin aspart  0-20 Units Subcutaneous TID WC  . insulin aspart  0-5 Units Subcutaneous QHS  . insulin aspart  5 Units Subcutaneous TID WC  . insulin glargine  45 Units Subcutaneous QHS  . isosorbide mononitrate  60 mg Oral Daily  . mouth rinse  15 mL Mouth Rinse q12n4p  . polyethylene glycol  17 g Oral Daily  . potassium chloride  40 mEq Oral BID  . rosuvastatin  20 mg Oral q1800  . torsemide  60 mg Oral BID  Continuous Infusions: . sodium chloride 10 mL/hr at 04/16/19 0600  . cefTRIAXone (ROCEPHIN)  IV 2 g (04/17/19 1701)     LOS: 5 days    Time spent: 35 minutes    Barb Merino, MD [facility] Pager [phone]  If 7PM-7AM, please contact night-coverage [email or web address] Password [email or web address] 04/18/2019, 11:20 AM

## 2019-04-19 DIAGNOSIS — N390 Urinary tract infection, site not specified: Secondary | ICD-10-CM

## 2019-04-19 DIAGNOSIS — H1031 Unspecified acute conjunctivitis, right eye: Secondary | ICD-10-CM

## 2019-04-19 DIAGNOSIS — A419 Sepsis, unspecified organism: Principal | ICD-10-CM

## 2019-04-19 DIAGNOSIS — R29810 Facial weakness: Secondary | ICD-10-CM

## 2019-04-19 LAB — GLUCOSE, CAPILLARY
Glucose-Capillary: 226 mg/dL — ABNORMAL HIGH (ref 70–99)
Glucose-Capillary: 308 mg/dL — ABNORMAL HIGH (ref 70–99)
Glucose-Capillary: 204 mg/dL — ABNORMAL HIGH (ref 70–99)
Glucose-Capillary: 303 mg/dL — ABNORMAL HIGH (ref 70–99)
Glucose-Capillary: 139 mg/dL — ABNORMAL HIGH (ref 70–99)

## 2019-04-19 MED ORDER — POLYVINYL ALCOHOL 1.4 % OP SOLN
2.0000 [drp] | OPHTHALMIC | Status: DC | PRN
  Filled 2019-04-19: qty 15

## 2019-04-19 MED ORDER — ARTIFICIAL TEARS OPHTHALMIC OINT: TOPICAL_OINTMENT | Freq: Every evening | OPHTHALMIC | Status: DC | PRN

## 2019-04-19 MED ORDER — ARTIFICIAL TEARS OPHTHALMIC OINT
1.0000 "application " | TOPICAL_OINTMENT | Freq: Every day | OPHTHALMIC | Status: DC
  Administered 2019-04-19 – 2019-04-24 (×5): 1 via OPHTHALMIC

## 2019-04-19 MED ORDER — POTASSIUM CHLORIDE 20 MEQ PO PACK: 20.0000 meq | PACK | Freq: Every day | ORAL | Status: DC

## 2019-04-19 MED ORDER — POTASSIUM CHLORIDE 20 MEQ PO PACK
40.0000 meq | PACK | Freq: Once | ORAL | Status: AC
  Administered 2019-04-19: 40 meq via ORAL
  Filled 2019-04-19: qty 2

## 2019-04-19 MED ORDER — TOBRAMYCIN-DEXAMETHASONE 0.3-0.1 % OP SUSP
2.0000 [drp] | Freq: Four times a day (QID) | OPHTHALMIC | Status: DC
  Administered 2019-04-20 – 2019-04-24 (×18): 2 [drp] via OPHTHALMIC
  Filled 2019-04-19: qty 2.5

## 2019-04-19 MED ORDER — ARTIFICIAL TEARS OPHTHALMIC OINT: TOPICAL_OINTMENT | OPHTHALMIC | Status: DC | PRN

## 2019-04-19 MED ORDER — POTASSIUM CHLORIDE 20 MEQ PO PACK
20.0000 meq | PACK | Freq: Every day | ORAL | Status: DC
  Administered 2019-04-20: 20 meq via ORAL
  Filled 2019-04-19: qty 1

## 2019-04-19 NOTE — Progress Notes (Signed)
PROGRESS NOTE    [name].  FXO:[id number]  DOB: [birth year]  DOA: 04/13/2019 PCP: Caren Macadam, MD  Brief Narrative:  58 year old morbidly obese man with history of Obstructive sleep apnea, obesity hypoventilation, not on any CPAP or BiPAP at home, COPD, chronic respiratory failure with hypoxia (uses 3 L nasal cannula) and hypercapnia, IDDM, coronary artery disease status post prior CABG, atrial fibrillation, CKD stage IV and diastolic congestive heart failure brought to the emergency room where he was found very somnolent.  Patient on 3 L of oxygen at home.  In the emergency room oxygen was 80, tidal CO2 77, bilateral wheezing with diminished sounds.  ABG showed pH 7.18, CO2 more than 97. He was hypothermic; given hypoxia and AMS,initiated code sepsis with cultures and broad-spectrum antibiotics. Initial VBG concerning with pH 7.18, CO2 greater than 97.  Patient was started on HFNC FiO2100% @ 60L. Patient eventually decompensated and became more somnolent, unresponsive to verbal stimuli, and progressively more hypoxic and was intubated. He was on mechanical ventilation (04/13/2019-04/15/2019) and managed in the ICU. Patient has had  multiple admissions for altered mental status and hypercapnia with most recent discharge on 8/6. He reports using 3 lits continuous [name] at baseline.   Subjective:  Patient sitting comfortably in bedside chair.  Trilogy is being set up by respiratory therapy.  He states he has not been using CPAP machine over the last couple of nights as sound bothering him and interfering with sleep.   Objective: Vitals:   04/18/19 2313 04/19/19 0500 04/19/19 0726 04/19/19 0747  BP: 114/82   121/69  Pulse: 94   (!) 58  Resp: 20   18  Temp: 98.4 F (36.9 C)   97.9 F (36.6 C)  TempSrc: Oral     SpO2: 98%  98% 98%  Weight:  (!) 159.8 kg    Height:        Intake/Output Summary (Last 24 hours) at 04/19/2019 1100 Last data filed at 04/19/2019 1024 Gross  per 24 hour  Intake 1720 ml  Output 5375 ml  Net -3655 ml   Filed Weights   04/17/19 0408 04/18/19 0500 04/19/19 0500  Weight: (!) 164.2 kg (!) 160.9 kg (!) 159.8 kg    Physical Examination:  General exam: Appears calm and comfortable  Respiratory system: Clear to auscultation. Respiratory effort normal. Cardiovascular system: S1 & S2 heard, RRR. No JVD, murmurs, rubs, gallops or clicks. Gastrointestinal system: Abdomen is obese, soft and nontender. Normal bowel sounds heard. Central nervous system: Alert and oriented.  Right facial droop (old) Extremities: 2+ pitting lower extremity edema, bilateral with stasis dermatitis Skin: No rashes, lesions or ulcers Psychiatry: Judgement and insight appear normal. Mood & affect appropriate.     Data Reviewed: I have personally reviewed following labs and imaging studies  CBC: Recent Labs  Lab 04/13/19 1623  04/14/19 0437 04/15/19 0313 04/16/19 0308 04/16/19 0354 04/17/19 0357 04/18/19 0721  WBC 9.1  --  8.0 14.2* 14.9*  --  10.7* 14.9*  NEUTROABS 6.8  --   --   --   --   --   --  10.9*  HGB 12.0*   < > 11.4* 11.6* 12.0* 13.9 11.9* 14.7  HCT 42.2   < > 39.7 39.3 39.3 41.0 40.0 47.0  MCV 104.7*  --  101.8* 99.5 97.8  --  98.3 94.4  PLT 208  --  214 230 256  --  199 257   < > = values in this  interval not displayed.   Basic Metabolic Panel: Recent Labs  Lab 04/13/19 2017  04/14/19 0437 04/15/19 0313 04/16/19 0308 04/16/19 0354 04/17/19 0357 04/18/19 0721  NA  --    < > 144 141 141 140 139 137  K  --    < > 4.4 3.0* 2.8* 2.9* 3.2* 3.3*  CL  --   --  98 93* 87*  --  89* 86*  CO2  --   --  29 33* 36*  --  34* 34*  GLUCOSE  --   --  228* 303* 273*  --  238* 207*  BUN  --   --  38* 61* 81*  --  85* 91*  CREATININE  --   --  2.72* 3.32* 3.50*  --  3.12* 3.09*  CALCIUM  --   --  9.2 9.3 8.9  --  9.0 9.4  MG 2.1  --  2.0  --  2.3  --   --  2.3  PHOS 4.1  --  <1.0* 3.0 4.0  4.1  --  4.8* 3.7   < > = values in this interval  not displayed.   GFR: Estimated Creatinine Clearance: 40.2 mL/min (A) (by C-G formula based on SCr of 3.09 mg/dL (H)). Liver Function Tests: Recent Labs  Lab 04/13/19 1623 04/15/19 0313 04/16/19 0308 04/17/19 0357  AST 22  --   --   --   ALT 27  --   --   --   ALKPHOS 289*  --   --   --   BILITOT 0.7  --   --   --   PROT 7.7  --   --   --   ALBUMIN 3.4* 3.2* 3.2* 3.1*   No results for input(s): LIPASE, AMYLASE in the last 168 hours. No results for input(s): AMMONIA in the last 168 hours. Coagulation Profile: Recent Labs  Lab 04/13/19 1623  INR 1.3*   Cardiac Enzymes: No results for input(s): CKTOTAL, CKMB, CKMBINDEX, TROPONINI in the last 168 hours. BNP (last 3 results) No results for input(s): PROBNP in the last 8760 hours. HbA1C: No results for input(s): HGBA1C in the last 72 hours. CBG: Recent Labs  Lab 04/18/19 0733 04/18/19 1135 04/18/19 1628 04/18/19 2129 04/19/19 0748  GLUCAP 198* 248* 254* 217* 226*   Lipid Profile: No results for input(s): CHOL, HDL, LDLCALC, TRIG, CHOLHDL, LDLDIRECT in the last 72 hours. Thyroid Function Tests: No results for input(s): TSH, T4TOTAL, FREET4, T3FREE, THYROIDAB in the last 72 hours. Anemia Panel: No results for input(s): VITAMINB12, FOLATE, FERRITIN, TIBC, IRON, RETICCTPCT in the last 72 hours. Sepsis Labs: Recent Labs  Lab 04/13/19 1623 04/13/19 2017  PROCALCITON  --  0.20  LATICACIDVEN 1.1 1.2    Recent Results (from the past 240 hour(s))  Urine culture     Status: Abnormal   Collection Time: 04/13/19  4:44 PM   Specimen: In/Out Cath Urine  Result Value Ref Range Status   Specimen Description IN/OUT CATH URINE  Final   Special Requests   Final    NONE Performed at [facility], [address] [place], [place] [address]    Culture 80,000 COLONIES/mL PROTEUS PENNERI (A)  Final   Report Status 04/16/2019 FINAL  Final   Organism ID, Bacteria PROTEUS PENNERI (A)  Final      Susceptibility   Proteus  penneri - MIC*    AMPICILLIN >=32 RESISTANT Resistant     CEFAZOLIN >=64 RESISTANT Resistant  CEFTRIAXONE <=1 SENSITIVE Sensitive     CIPROFLOXACIN >=4 RESISTANT Resistant     GENTAMICIN <=1 SENSITIVE Sensitive     IMIPENEM 0.5 SENSITIVE Sensitive     NITROFURANTOIN 128 RESISTANT Resistant     TRIMETH/SULFA <=20 SENSITIVE Sensitive     AMPICILLIN/SULBACTAM 16 INTERMEDIATE Intermediate     PIP/TAZO <=4 SENSITIVE Sensitive     * 80,000 COLONIES/mL PROTEUS PENNERI  Blood Culture (routine x 2)     Status: None   Collection Time: 04/13/19  4:45 PM   Specimen: BLOOD RIGHT WRIST  Result Value Ref Range Status   Specimen Description BLOOD RIGHT WRIST  Final   Special Requests   Final    BOTTLES DRAWN AEROBIC AND ANAEROBIC Blood Culture adequate volume   Culture   Final    NO GROWTH 5 DAYS Performed at [facility], [address] [place], [place] [address]    Report Status 04/18/2019 FINAL  Final  SARS CORONAVIRUS 2 Nasal Swab Aptima Multi Swab     Status: None   Collection Time: 04/13/19  5:11 PM   Specimen: Aptima Multi Swab; Nasal Swab  Result Value Ref Range Status   SARS Coronavirus 2 NEGATIVE NEGATIVE Final    Comment: (NOTE) SARS-CoV-2 target nucleic acids are NOT DETECTED. The SARS-CoV-2 RNA is generally detectable in upper and lower respiratory specimens during the acute phase of infection. Negative results do not preclude SARS-CoV-2 infection, do not rule out co-infections with other pathogens, and should not be used as the sole basis for treatment or other patient management decisions. Negative results must be combined with clinical observations, patient history, and epidemiological information. The expected result is Negative. Fact Sheet for Patients: SugarRoll.be Fact Sheet for Healthcare Providers: https://www.woods-mathews.com/ This test is not yet approved or cleared by the [place] FDA and  has been authorized  for detection and/or diagnosis of SARS-CoV-2 by FDA under an Emergency Use Authorization (EUA). This EUA will remain  in effect (meaning this test can be used) for the duration of the COVID-19 declaration under Section 56 4(b)(1) of the Act, 21 U.S.C. section 360bbb-3(b)(1), unless the authorization is terminated or revoked sooner. Performed at [facility], [place] [address], [place], [place] [address]   Novel Coronavirus, NAA (hospital order; send-out to ref lab)     Status: None   Collection Time: 04/13/19  5:11 PM   Specimen: Nasopharyngeal Swab; Respiratory  Result Value Ref Range Status   SARS-CoV-2, NAA NOT DETECTED NOT DETECTED Final    Comment: (NOTE) This test was developed and its performance characteristics determined by [employer]. This test has not been FDA cleared or approved. This test has been authorized by FDA under an Emergency Use Authorization (EUA). This test is only authorized for the duration of time the declaration that circumstances exist justifying the authorization of the emergency use of in vitro diagnostic tests for detection of SARS-CoV-2 virus and/or diagnosis of COVID-19 infection under section 564(b)(1) of the Act, 21 U.S.C. 762GBT-5(V)(7), unless the authorization is terminated or revoked sooner. When diagnostic testing is negative, the possibility of a false negative result should be considered in the context of a patient's recent exposures and the presence of clinical signs and symptoms consistent with COVID-19. An individual without symptoms of COVID-19 and who is not shedding SARS-CoV-2 virus would expect to have a negative (not detected) result in this assay. Performed  At: [facility] [place], [place] [id number] Rush Farmer MD GY:[id number]  Coronavirus Source NASOPHARYNGEAL  Final    Comment: Performed at [facility], [place] [address] [place], [place] [address]  Blood Culture (routine x 2)      Status: None   Collection Time: 04/13/19  5:15 PM   Specimen: BLOOD LEFT HAND  Result Value Ref Range Status   Specimen Description BLOOD LEFT HAND  Final   Special Requests   Final    BOTTLES DRAWN AEROBIC AND ANAEROBIC Blood Culture results may not be optimal due to an inadequate volume of blood received in culture bottles   Culture   Final    NO GROWTH 5 DAYS Performed at [facility], [place] [address], [place], [place] [address]    Report Status 04/18/2019 FINAL  Final  MRSA PCR Screening     Status: None   Collection Time: 04/13/19 10:05 PM   Specimen: Nasopharyngeal  Result Value Ref Range Status   MRSA by PCR NEGATIVE NEGATIVE Final    Comment:        The GeneXpert MRSA Assay (FDA approved for NASAL specimens only), is one component of a comprehensive MRSA colonization surveillance program. It is not intended to diagnose MRSA infection nor to guide or monitor treatment for MRSA infections. Performed at [facility], [place] [address] [place], [place] [address]   Respiratory Panel by PCR     Status: None   Collection Time: 04/14/19  3:16 AM   Specimen: Nasopharyngeal Swab; Respiratory  Result Value Ref Range Status   Adenovirus NOT DETECTED NOT DETECTED Final   Coronavirus 229E NOT DETECTED NOT DETECTED Final    Comment: (NOTE) The Coronavirus on the Respiratory Panel, DOES NOT test for the novel  Coronavirus (2019 nCoV)    Coronavirus HKU1 NOT DETECTED NOT DETECTED Final   Coronavirus NL63 NOT DETECTED NOT DETECTED Final   Coronavirus OC43 NOT DETECTED NOT DETECTED Final   Metapneumovirus NOT DETECTED NOT DETECTED Final   Rhinovirus / Enterovirus NOT DETECTED NOT DETECTED Final   Influenza A NOT DETECTED NOT DETECTED Final   Influenza B NOT DETECTED NOT DETECTED Final   Parainfluenza Virus 1 NOT DETECTED NOT DETECTED Final   Parainfluenza Virus 2 NOT DETECTED NOT DETECTED Final   Parainfluenza Virus 3 NOT DETECTED NOT DETECTED Final   Parainfluenza  Virus 4 NOT DETECTED NOT DETECTED Final   Respiratory Syncytial Virus NOT DETECTED NOT DETECTED Final   Bordetella pertussis NOT DETECTED NOT DETECTED Final   Chlamydophila pneumoniae NOT DETECTED NOT DETECTED Final   Mycoplasma pneumoniae NOT DETECTED NOT DETECTED Final    Comment: Performed at [facility], [place]. [address], [place], [place] [address]  Culture, respiratory (non-expectorated)     Status: None   Collection Time: 04/14/19  3:16 AM   Specimen: Tracheal Aspirate; Respiratory  Result Value Ref Range Status   Specimen Description TRACHEAL ASPIRATE  Final   Special Requests NONE  Final   Gram Stain   Final    MODERATE WBC PRESENT,BOTH PMN AND MONONUCLEAR NO ORGANISMS SEEN    Culture   Final    Consistent with normal respiratory flora. Performed at [facility], [place] [address], [place], [place] [address]    Report Status 04/16/2019 FINAL  Final      Radiology Studies: No results found.      Scheduled Meds: . amiodarone  100 mg Oral Daily  . apixaban  5 mg Oral BID  . arformoterol  15 mcg Nebulization BID  .  carvedilol  12.5 mg Oral BID WC  . chlorhexidine gluconate (MEDLINE KIT)  15 mL Mouth Rinse BID  . Chlorhexidine Gluconate Cloth  6 each Topical Daily  . docusate sodium  100 mg Oral Daily  . famotidine  20 mg Oral Daily  . insulin aspart  0-20 Units Subcutaneous TID WC  . insulin aspart  0-5 Units Subcutaneous QHS  . insulin aspart  5 Units Subcutaneous TID WC  . insulin glargine  45 Units Subcutaneous QHS  . isosorbide mononitrate  60 mg Oral Daily  . mouth rinse  15 mL Mouth Rinse q12n4p  . polyethylene glycol  17 g Oral Daily  . rosuvastatin  20 mg Oral q1800  . torsemide  60 mg Oral BID   Continuous Infusions: . sodium chloride 10 mL/hr at 04/16/19 0600  . cefTRIAXone (ROCEPHIN)  IV 2 g (04/18/19 1657)    Assessment & Plan:    1.Acute on chronic respiratory failure with hypoxia and hypercapnia: Likely multifactorial in the setting  of untreated sleep apnea, obesity hypoventilation, COPD, narcotic use and mild acute diastolic CHF: Patient required mechanical ventilation 8/23 through 8/25.  He was treated with IV diuretics, steroids and empiric antibiotics.  Currently back to baseline O2 needs of 3 L continuous.  Not been very compliant with BiPAP/CPAP ER.  Will discuss with RT.  Trilogy being set up for home use.  Patient advised the importance of compliance with NIV to avoid recurrent hypercapnic respiratory failure related admissions and encephalopathy.  Continue inhaler therapy as recommended by pulmonary.  2.  Acute metabolic encephalopathy: Present on admission in relation to problem #1/hypercapnia and UTI.  Now back to baseline  3.  Proteus UTI/sepsis: Currently on day 5 of 5 ceftriaxone.  Urine cultures did show multidrug resistance but sensitive to ceftriaxone.  Blood cultures negative.  Patient still has some leukocytosis but afebrile.  Patient appears to have received steroids during the ICU course which might explain persistent leukocytosis  4.  Paroxysmal atrial Fibrillation : On presentation rhythm sinus and rate controlled. CHADSVASC2 >4 On Eliquis 5 mg, Coreg and amiodarone 155m at home, which have been resumed  5.  Hypertension with LVH/acute on chronic diastolic CHF: Admission chest x-ray did show cardiogenic pulmonary edema and patient diuresed with IV Lasix during ICU stay.  Now back on oral torsemide.  Resume home antihypertensive therapy including Coreg/Imdur  6.  Acute on CKD stage III: Creatinine was 2.5 on presentation and peaked to 3.5 during hospitalization on diuretics.  Now improved to 3.0.  Will obtain labs in a.m.  7.  CAD: Patient apparently failed CABG.  EKG unremarkable in this admission.  Resumed on home medications including Crestor, Coreg, Imdur and medical therapy for A. fib including anticoagulation.  Not sure if patient on additional aspirin at home.  8.Right-sided facial droop: Patient has  history of acoustic neuroma, status post craniotomy with right facial nerve paralysis .  9. Diabetes mellitus: Continue Lantus insulin and pre-meal insulin with sliding scale coverage  10.?  Hypothyroidism: Repeat levels as outpatient.  TSH low normal at 0.8.  Not on any home medications.  11. Hypokalemia: Replace as needed  DVT prophylaxis: On anticoagulation Code Status: Full code Family / Patient Communication: Discussed with patient and all questions answered Disposition Plan: In next 24 to 48 hours when trilogy set up.     LOS: 6 days    Time spent: 35 minutes    NGuilford Shi MD [facility] Pager [phone] If 7PM-7AM, please  contact night-coverage [email or web address] Password [email or web address] 04/19/2019, 11:00 AM

## 2019-04-19 NOTE — Consult Note (Signed)
Consultation Note Date: 04/19/2019   Patient Name: [name].  DOB: 04-12-1961  MRN: [id number]  Age / Sex: 58 y.o., male  PCP: Caren Macadam, MD Referring Physician: Guilford Shi, MD  Reason for Consultation: Disposition and Establishing goals of care  HPI/Patient Profile: 58 year old man with multiple progressive chronic medical problems that include morbid obesity, COPD, OSA/OHS, CHF, CAD and stage 4 CKD who has had multiple recurrent admissions for acute on chronic respiratory failure. On this admission he required mechanical ventilation. He has imporoved significantly throughout the hospitalization and plans are being made that include placing him on a trilogy bipap on discharge home.He also struggles with chronic pain from peripheral neuropathy and trigeminal neuralgia. He takes low dose opioids and is on a contract with [facility] outpatient.  Clinical Assessment and Goals of Care: 1. Initial Code Status discussion today- he doesn't "want that tube shoved down my throat". He has a living will, completed with an attorney, wants to include his wife in conversation.  2. Poor insight into just how sick he really is in general. I provide information to him.  3. Goal is to get home and stay out of the hospital.  4. He is worried about losing access to his "pain medications". He sees [facility] and has to get random "pee tests" which has been hard on both he and his wife.   5. He has lots of questions about the trilogy machine and is already worried about his ability to tolerate the mask  6. 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 It has an infection on my clinical assessment today and is very dry. He wants "drops" for this.  7. He is open to [facility] following him at home.    SUMMARY OF RECOMMENDATIONS    Code  Status/Advance Care Planning:  Full code- needs further discussion with wife involved-can be done at home with [facility]    Symptom Management:   Continue TID hydrocodone  Eye Ointment and Tobradex for 7 days  Discharge Planning: Home with Palliative Services      Primary Diagnoses: Present on Admission: . Obesity hypoventilation syndrome ([place]) . Conjunctivitis   I have reviewed the medical record, interviewed the patient and family, and examined the patient. The following aspects are pertinent.  Past Medical History:  Diagnosis Date  . Brain tumor ([place])    Takes Topamax so patient will not have headaches  . Bruises easily   . CAD (coronary artery disease)    Cath 09, 99% LAD  . Cardiomyopathy, ischemic    Ischemic  . CHF (congestive heart failure) ([place])   . Cyst near coccyx   . Cyst near tailbone   . Diabetes mellitus without complication ([facility])    borderline  . Edema of both legs    Takes Lasix  . Fibromyalgia   . Gout   . Headache(784.0)    with lights  . Hypertension  dr hochrein  . Kidney stones   . Myocardial infarction ([place])   . Obesity   . Pneumonia    hx of  . Shortness of breath   . Stroke [facility])    Affected vision, walking, and facial drooping on right face   Social History   Socioeconomic History  . Marital status: Married    Spouse name: Not on file  . Number of children: Not on file  . Years of education: Not on file  . Highest education level: Not on file  Occupational History  . Not on file  Social Needs  . Financial resource strain: Not on file  . Food insecurity    Worry: Not on file    Inability: Not on file  . Transportation needs    Medical: Not on file    Non-medical: Not on file  Tobacco Use  . Smoking status: Former Smoker    Packs/day: 0.25    Years: 20.00    Pack years: 5.00    Types: Cigarettes  . Smokeless tobacco: Current User  . Tobacco comment: Rare tobacco.  "[name]".  Substance and Sexual Activity   . Alcohol use: No    Alcohol/week: 0.0 standard drinks  . Drug use: No  . Sexual activity: Yes  Lifestyle  . Physical activity    Days per week: Not on file    Minutes per session: Not on file  . Stress: Not on file  Relationships  . Social Herbalist on phone: Not on file    Gets together: Not on file    Attends religious service: Not on file    Active member of club or organization: Not on file    Attends meetings of clubs or organizations: Not on file    Relationship status: Not on file  Other Topics Concern  . Not on file  Social History Narrative  . Not on file   Family History  Problem Relation Age of Onset  . CAD Father   . Hypertension Mother   . Cancer Other        multiple relatives  . Diabetes Maternal Grandmother   . Diabetes Maternal Aunt   . Leukemia Cousin   . Leukemia Maternal Grandfather    Scheduled Meds: . amiodarone  100 mg Oral Daily  . apixaban  5 mg Oral BID  . arformoterol  15 mcg Nebulization BID  . artificial tears  1 application Right Eye QHS  . carvedilol  12.5 mg Oral BID WC  . chlorhexidine gluconate (MEDLINE KIT)  15 mL Mouth Rinse BID  . Chlorhexidine Gluconate Cloth  6 each Topical Daily  . docusate sodium  100 mg Oral Daily  . famotidine  20 mg Oral Daily  . insulin aspart  0-20 Units Subcutaneous TID WC  . insulin aspart  0-5 Units Subcutaneous QHS  . insulin aspart  5 Units Subcutaneous TID WC  . insulin glargine  45 Units Subcutaneous QHS  . isosorbide mononitrate  60 mg Oral Daily  . mouth rinse  15 mL Mouth Rinse q12n4p  . polyethylene glycol  17 g Oral Daily  . [START ON 04/20/2019] potassium chloride  20 mEq Oral Daily  . rosuvastatin  20 mg Oral q1800  . tobramycin-dexamethasone  2 drop Right Eye Q6H  . torsemide  60 mg Oral BID   Continuous Infusions: . sodium chloride 10 mL/hr at 04/16/19 0600  . cefTRIAXone (ROCEPHIN)  IV 2 g (04/18/19  1657)   PRN Meds:.sodium chloride, acetaminophen, artificial tears,  HYDROcodone-acetaminophen, ondansetron (ZOFRAN) IV, polyvinyl alcohol Medications Prior to Admission:  Prior to Admission medications   Medication Sig Start Date End Date Taking? Authorizing Provider  albuterol (VENTOLIN HFA) 108 (90 Base) MCG/ACT inhaler Inhale 2 puffs into the lungs every 6 (six) hours as needed for wheezing or shortness of breath. 03/14/19  Yes Koberlein, Junell C, MD  amiodarone (PACERONE) 100 MG tablet Take 1 tablet (100 mg total) by mouth daily. 02/06/19  Yes Clegg, Amy D, NP  apixaban (ELIQUIS) 5 MG TABS tablet Take 1 tablet (5 mg total) by mouth 2 (two) times daily. 02/06/19  Yes Clegg, Amy D, NP  artificial tears (LACRILUBE) OINT ophthalmic ointment Place 1 application into both eyes at bedtime as needed for dry eyes.    Yes [provider]  carvedilol (COREG) 12.5 MG tablet Take 1 tablet (12.5 mg total) by mouth 2 (two) times daily with a meal. 02/06/19  Yes Clegg, Amy D, NP  docusate sodium (COLACE) 100 MG capsule Take 100 mg by mouth at bedtime.   Yes [provider]  guaiFENesin (MUCINEX) 600 MG 12 hr tablet Take 2 tablets (1,200 mg total) by mouth 2 (two) times daily. 02/27/19  Yes Florencia Reasons, MD  HYDROcodone-acetaminophen (NORCO) 10-325 MG tablet TAKE 1 TABLET BY MOUTH EVERY 6 HOURS AS NEEDED FOR PAIN MUST LAST 30 DAYS Patient taking differently: Take 1 tablet by mouth every 6 (six) hours as needed (pain).  04/04/19  Yes Bayard Hugger, NP  hydroxypropyl methylcellulose / hypromellose (ISOPTO TEARS / GONIOVISC) 2.5 % ophthalmic solution Place 1 drop into both eyes 3 (three) times daily as needed for dry eyes.   Yes [provider]  isosorbide mononitrate (IMDUR) 30 MG 24 hr tablet Take 1 tablet (30 mg total) by mouth daily. Patient taking differently: Take 30 mg by mouth at bedtime.  02/06/19  Yes Clegg, Amy D, NP  LANTUS 100 UNIT/ML injection INJECT 45 UNITS INTO THE SKIN AT BEDTIME FOR 120 DOSES Patient taking differently: Inject 45 Units into the  skin at bedtime.  12/13/18  Yes Koberlein, Junell C, MD  nystatin (MYCOSTATIN/NYSTOP) powder APPLY  1  GRAM TOPICALLY TWICE DAILY AS NEEDED Patient taking differently: Apply 1 g topically 2 (two) times daily as needed (rash).  04/08/18  Yes Meredith Staggers, MD  potassium chloride SA (K-DUR) 20 MEQ tablet Take 2 tablets (40 mEq total) by mouth 2 (two) times daily. 02/06/19  Yes Clegg, Amy D, NP  rosuvastatin (CRESTOR) 20 MG tablet Take 1 tablet (20 mg total) by mouth daily at 6 PM. 03/25/19 04/24/19 Yes Elodia Florence., MD  tiZANidine (ZANAFLEX) 4 MG tablet TAKE 1 TABLET BY MOUTH FOUR TIMES DAILY AS NEEDED FOR MUSCLE SPASMS Patient taking differently: Take 4 mg by mouth 4 (four) times daily as needed for muscle spasms.  03/17/19  Yes Meredith Staggers, MD  topiramate (TOPAMAX) 100 MG tablet Take 1 tablet (100 mg total) by mouth at bedtime. 10/09/18  Yes Meredith Staggers, MD  torsemide (DEMADEX) 20 MG tablet TAKE 3 TABLETS (60 MG TOTAL) BY MOUTH 2 TIMES DAILY. Patient taking differently: Take 60 mg by mouth 2 (two) times daily.  02/06/19  Yes [name], MD  Blood Glucose Monitoring Suppl (ONE TOUCH ULTRA MINI) w/Device KIT Use as directed twice a day 12/25/18   Koberlein, Steele Berg, MD  Hydroactive Dressings (DUODERM HYDROACTIVE) GEL duoderm or generic equivalent gel dressing. 4cmx4cm.  Apply to wound q 3 days until healed. 09/13/18   Koberlein, Steele Berg, MD  Insulin Pen Needle 31G X 8 MM MISC 1 Units by Does not apply route 2 (two) times a day. 02/11/19   Caren Macadam, MD  Lancets (ONETOUCH ULTRASOFT) lancets Use as instructed 12/25/18   Caren Macadam, MD  ONE TOUCH ULTRA TEST test strip Use as instructed twice a day 12/25/18   Caren Macadam, MD   Allergies  Allergen Reactions  . Morphine And Related Anaphylaxis    "makes me stop breathing"  . Novocain [Procaine] Anaphylaxis  . Carbamazepine     shaking  . Codeine Other (See Comments)    "makes heart race" per patient  .  Gabapentin Other (See Comments)    Blood in urine  . Ivp Dye [Iodinated Diagnostic Agents]     Passing out  . Other     Steroids makes hearts race  . Prednisone     Heart racing   Review of Systems  Physical Exam  Vital Signs: BP 134/76 (BP Location: Right Wrist)   Pulse (!) 58   Temp 99 F (37.2 C) (Oral)   Resp (!) 24   Ht 5' 11"  (1.803 m)   Wt (!) 159.8 kg   SpO2 100%   BMI 49.14 kg/m  Pain Scale: 0-10 POSS *See Group Information*: S-Acceptable,Sleep, easy to arouse Pain Score: 8    SpO2: SpO2: 100 % O2 Device:SpO2: 100 % O2 Flow Rate: .O2 Flow Rate (L/min): 3 L/min  IO: Intake/output summary:   Intake/Output Summary (Last 24 hours) at 04/19/2019 1620 Last data filed at 04/19/2019 1024 Gross per 24 hour  Intake 900 ml  Output 4175 ml  Net -3275 ml    LBM: Last BM Date: 04/19/19 Baseline Weight: Weight: (!) 142.9 kg Most recent weight: Weight: (!) 159.8 kg     Palliative Assessment/Data:      Time Total: 50 min Greater than 50%  of this time was spent counseling and coordinating care related to the above assessment and plan.  Signed by: Lane Hacker, DO   Please contact Palliative Medicine Team phone at [phone] for questions and concerns.  For individual provider: See Shea Evans

## 2019-04-20 ENCOUNTER — Other Ambulatory Visit: Payer: Self-pay

## 2019-04-20 LAB — BASIC METABOLIC PANEL
Anion gap: 22 — ABNORMAL HIGH (ref 5–15)
BUN: 98 mg/dL — ABNORMAL HIGH (ref 6–20)
CO2: 29 mmol/L (ref 22–32)
Calcium: 8.9 mg/dL (ref 8.9–10.3)
Chloride: 78 mmol/L — ABNORMAL LOW (ref 98–111)
Creatinine, Ser: 3.2 mg/dL — ABNORMAL HIGH (ref 0.61–1.24)
GFR calc Af Amer: 23 mL/min — ABNORMAL LOW (ref >60)
GFR calc non Af Amer: 20 mL/min — ABNORMAL LOW (ref >60)
Glucose, Bld: 286 mg/dL — ABNORMAL HIGH (ref 70–99)
Potassium: 3.2 mmol/L — ABNORMAL LOW (ref 3.5–5.1)
Sodium: 129 mmol/L — ABNORMAL LOW (ref 135–145)

## 2019-04-20 LAB — GLUCOSE, CAPILLARY
Glucose-Capillary: 321 mg/dL — ABNORMAL HIGH (ref 70–99)
Glucose-Capillary: 254 mg/dL — ABNORMAL HIGH (ref 70–99)
Glucose-Capillary: 272 mg/dL — ABNORMAL HIGH (ref 70–99)
Glucose-Capillary: 250 mg/dL — ABNORMAL HIGH (ref 70–99)

## 2019-04-20 MED ORDER — SODIUM CHLORIDE 0.9 % IV SOLN
INTRAVENOUS | Status: AC
  Administered 2019-04-20: 16:00:00 via INTRAVENOUS

## 2019-04-20 MED ORDER — POTASSIUM CHLORIDE 20 MEQ PO PACK
40.0000 meq | PACK | Freq: Once | ORAL | Status: AC
  Administered 2019-04-20: 40 meq via ORAL
  Filled 2019-04-20: qty 2

## 2019-04-20 MED ORDER — POTASSIUM CHLORIDE 20 MEQ PO PACK
20.0000 meq | PACK | Freq: Two times a day (BID) | ORAL | Status: DC
  Administered 2019-04-20 – 2019-04-21 (×2): 20 meq via ORAL
  Filled 2019-04-20 (×2): qty 1

## 2019-04-20 NOTE — Progress Notes (Signed)
Patient Mews score 2 due to pulse. Dinamap not accurately checking pulse. Vitals rechecked. Pulse checked manually. Pulse monitored continuously via heart monitor. Manual pulse aligns with heart monitor with rates ranging from 50s-60s. Will continue to monitor and will pass need to check pulse manually via verbal nurse report.

## 2019-04-20 NOTE — Progress Notes (Addendum)
PROGRESS NOTE    [name].  DXI:[id number]  DOB: 1961-08-03  DOA: 04/13/2019 PCP: Caren Macadam, MD  Brief Narrative:  58 year old morbidly obese man with history of Obstructive sleep apnea, obesity hypoventilation, not on any CPAP or BiPAP at home, COPD, chronic respiratory failure with hypoxia (uses 3 L nasal cannula) and hypercapnia, IDDM, coronary artery disease status post prior CABG, atrial fibrillation, CKD stage IV and diastolic congestive heart failure brought to the emergency room where he was found very somnolent.  Patient on 3 L of oxygen at home.  In the emergency room oxygen was 80, tidal CO2 77, bilateral wheezing with diminished sounds.  ABG showed pH 7.18, CO2 more than 97. He was hypothermic; given hypoxia and AMS,initiated code sepsis with cultures and broad-spectrum antibiotics. Initial VBG concerning with pH 7.18, CO2 greater than 97.  Patient was started on HFNC FiO2100% @ 60L. Patient eventually decompensated and became more somnolent, unresponsive to verbal stimuli, and progressively more hypoxic and was intubated. He was on mechanical ventilation (04/13/2019-04/15/2019) and managed in the ICU. Patient has had  multiple admissions for altered mental status and hypercapnia with most recent discharge on 8/6. He reports using 3 lits continuous [place] at baseline.   Subjective:  Patient used BIPAP last night at settings per RT. He states he couldn't sleep with air blowing on his face. Triology to be set up in am  Objective: Vitals:   04/19/19 2331 04/20/19 0103 04/20/19 0752 04/20/19 0755  BP: 124/78 98/63  111/85  Pulse: (!) 35 (!) 56  62  Resp: 18 16  17   Temp: 98.8 F (37.1 C) 98.8 F (37.1 C)  97.7 F (36.5 C)  TempSrc: Oral Oral  Oral  SpO2: 100% 99% 100% 99%  Weight:      Height:        Intake/Output Summary (Last 24 hours) at 04/20/2019 1358 Last data filed at 04/20/2019 0616 Gross per 24 hour  Intake -  Output 1700 ml  Net -1700 ml   Filed Weights   04/17/19 0408 04/18/19 0500 04/19/19 0500  Weight: (!) 164.2 kg (!) 160.9 kg (!) 159.8 kg    Physical Examination:  General exam: Appears calm and comfortable  Respiratory system: Clear to auscultation. Respiratory effort normal. Cardiovascular system: S1 & S2 heard, RRR. No JVD, murmurs, rubs, gallops or clicks. Gastrointestinal system: Abdomen is obese, soft and nontender. Normal bowel sounds heard. Central nervous system: Alert and oriented.  Right facial droop (old) Extremities: 2+ pitting lower extremity edema, bilateral with stasis dermatitis Skin: No rashes, lesions or ulcers Psychiatry: Judgement and insight appear normal. Mood & affect appropriate.     Data Reviewed: I have personally reviewed following labs and imaging studies  CBC: Recent Labs  Lab 04/13/19 1623  04/14/19 0437 04/15/19 0313 04/16/19 0308 04/16/19 0354 04/17/19 0357 04/18/19 0721  WBC 9.1  --  8.0 14.2* 14.9*  --  10.7* 14.9*  NEUTROABS 6.8  --   --   --   --   --   --  10.9*  HGB 12.0*   < > 11.4* 11.6* 12.0* 13.9 11.9* 14.7  HCT 42.2   < > 39.7 39.3 39.3 41.0 40.0 47.0  MCV 104.7*  --  101.8* 99.5 97.8  --  98.3 94.4  PLT 208  --  214 230 256  --  199 257   < > = values in this interval not displayed.   Basic Metabolic Panel: Recent Labs  Lab 04/13/19  2017  04/14/19 8546 04/15/19 2703 04/16/19 0308 04/16/19 0354 04/17/19 0357 04/18/19 0721 04/20/19 0739  NA  --    < > 144 141 141 140 139 137 129*  K  --    < > 4.4 3.0* 2.8* 2.9* 3.2* 3.3* 3.2*  CL  --   --  98 93* 87*  --  89* 86* 78*  CO2  --   --  29 33* 36*  --  34* 34* 29  GLUCOSE  --   --  228* 303* 273*  --  238* 207* 286*  BUN  --   --  38* 61* 81*  --  85* 91* 98*  CREATININE  --   --  2.72* 3.32* 3.50*  --  3.12* 3.09* 3.20*  CALCIUM  --   --  9.2 9.3 8.9  --  9.0 9.4 8.9  MG 2.1  --  2.0  --  2.3  --   --  2.3  --   PHOS 4.1  --  <1.0* 3.0 4.0  4.1  --  4.8* 3.7  --    < > = values in this interval not  displayed.   GFR: Estimated Creatinine Clearance: 38.8 mL/min (A) (by C-G formula based on SCr of 3.2 mg/dL (H)). Liver Function Tests: Recent Labs  Lab 04/13/19 1623 04/15/19 0313 04/16/19 0308 04/17/19 0357  AST 22  --   --   --   ALT 27  --   --   --   ALKPHOS 289*  --   --   --   BILITOT 0.7  --   --   --   PROT 7.7  --   --   --   ALBUMIN 3.4* 3.2* 3.2* 3.1*   No results for input(s): LIPASE, AMYLASE in the last 168 hours. No results for input(s): AMMONIA in the last 168 hours. Coagulation Profile: Recent Labs  Lab 04/13/19 1623  INR 1.3*   Cardiac Enzymes: No results for input(s): CKTOTAL, CKMB, CKMBINDEX, TROPONINI in the last 168 hours. BNP (last 3 results) No results for input(s): PROBNP in the last 8760 hours. HbA1C: No results for input(s): HGBA1C in the last 72 hours. CBG: Recent Labs  Lab 04/19/19 1725 04/19/19 2052 04/19/19 2147 04/20/19 0757 04/20/19 1221  GLUCAP 204* 303* 139* 321* 254*   Lipid Profile: No results for input(s): CHOL, HDL, LDLCALC, TRIG, CHOLHDL, LDLDIRECT in the last 72 hours. Thyroid Function Tests: No results for input(s): TSH, T4TOTAL, FREET4, T3FREE, THYROIDAB in the last 72 hours. Anemia Panel: No results for input(s): VITAMINB12, FOLATE, FERRITIN, TIBC, IRON, RETICCTPCT in the last 72 hours. Sepsis Labs: Recent Labs  Lab 04/13/19 1623 04/13/19 2017  PROCALCITON  --  0.20  LATICACIDVEN 1.1 1.2    Recent Results (from the past 240 hour(s))  Urine culture     Status: Abnormal   Collection Time: 04/13/19  4:44 PM   Specimen: In/Out Cath Urine  Result Value Ref Range Status   Specimen Description IN/OUT CATH URINE  Final   Special Requests   Final    NONE Performed at [facility], [address], [place], [place] [address]    Culture 80,000 COLONIES/mL PROTEUS PENNERI (A)  Final   Report Status 04/16/2019 FINAL  Final   Organism ID, Bacteria PROTEUS PENNERI (A)  Final      Susceptibility   Proteus penneri  - MIC*    AMPICILLIN >=32 RESISTANT Resistant     CEFAZOLIN >=64  RESISTANT Resistant     CEFTRIAXONE <=1 SENSITIVE Sensitive     CIPROFLOXACIN >=4 RESISTANT Resistant     GENTAMICIN <=1 SENSITIVE Sensitive     IMIPENEM 0.5 SENSITIVE Sensitive     NITROFURANTOIN 128 RESISTANT Resistant     TRIMETH/SULFA <=20 SENSITIVE Sensitive     AMPICILLIN/SULBACTAM 16 INTERMEDIATE Intermediate     PIP/TAZO <=4 SENSITIVE Sensitive     * 80,000 COLONIES/mL PROTEUS PENNERI  Blood Culture (routine x 2)     Status: None   Collection Time: 04/13/19  4:45 PM   Specimen: BLOOD RIGHT WRIST  Result Value Ref Range Status   Specimen Description BLOOD RIGHT WRIST  Final   Special Requests   Final    BOTTLES DRAWN AEROBIC AND ANAEROBIC Blood Culture adequate volume   Culture   Final    NO GROWTH 5 DAYS Performed at [facility], [address] [place], [place] [address]    Report Status 04/18/2019 FINAL  Final  SARS CORONAVIRUS 2 Nasal Swab Aptima Multi Swab     Status: None   Collection Time: 04/13/19  5:11 PM   Specimen: Aptima Multi Swab; Nasal Swab  Result Value Ref Range Status   SARS Coronavirus 2 NEGATIVE NEGATIVE Final    Comment: (NOTE) SARS-CoV-2 target nucleic acids are NOT DETECTED. The SARS-CoV-2 RNA is generally detectable in upper and lower respiratory specimens during the acute phase of infection. Negative results do not preclude SARS-CoV-2 infection, do not rule out co-infections with other pathogens, and should not be used as the sole basis for treatment or other patient management decisions. Negative results must be combined with clinical observations, patient history, and epidemiological information. The expected result is Negative. Fact Sheet for Patients: SugarRoll.be Fact Sheet for Healthcare Providers: https://www.woods-mathews.com/ This test is not yet approved or cleared by the [place] FDA and  has been authorized for  detection and/or diagnosis of SARS-CoV-2 by FDA under an Emergency Use Authorization (EUA). This EUA will remain  in effect (meaning this test can be used) for the duration of the COVID-19 declaration under Section 56 4(b)(1) of the Act, 21 U.S.C. section 360bbb-3(b)(1), unless the authorization is terminated or revoked sooner. Performed at [facility], [place] [address], [place], [place] [address]   Novel Coronavirus, NAA (hospital order; send-out to ref lab)     Status: None   Collection Time: 04/13/19  5:11 PM   Specimen: Nasopharyngeal Swab; Respiratory  Result Value Ref Range Status   SARS-CoV-2, NAA NOT DETECTED NOT DETECTED Final    Comment: (NOTE) This test was developed and its performance characteristics determined by [employer]. This test has not been FDA cleared or approved. This test has been authorized by FDA under an Emergency Use Authorization (EUA). This test is only authorized for the duration of time the declaration that circumstances exist justifying the authorization of the emergency use of in vitro diagnostic tests for detection of SARS-CoV-2 virus and/or diagnosis of COVID-19 infection under section 564(b)(1) of the Act, 21 U.S.C. 834HDQ-2(I)(2), unless the authorization is terminated or revoked sooner. When diagnostic testing is negative, the possibility of a false negative result should be considered in the context of a patient's recent exposures and the presence of clinical signs and symptoms consistent with COVID-19. An individual without symptoms of COVID-19 and who is not shedding SARS-CoV-2 virus would expect to have a negative (not detected) result in this assay. Performed  At: [facility] [address] [place], [place] [id number]  Rush Farmer MD QB:[id number]    Coronavirus Source NASOPHARYNGEAL  Final    Comment: Performed at [facility], [place] [address], [place], [place] [address]  Blood Culture (routine x 2)      Status: None   Collection Time: 04/13/19  5:15 PM   Specimen: BLOOD LEFT HAND  Result Value Ref Range Status   Specimen Description BLOOD LEFT HAND  Final   Special Requests   Final    BOTTLES DRAWN AEROBIC AND ANAEROBIC Blood Culture results may not be optimal due to an inadequate volume of blood received in culture bottles   Culture   Final    NO GROWTH 5 DAYS Performed at [facility], [place] [address], [place], [place] [address]    Report Status 04/18/2019 FINAL  Final  MRSA PCR Screening     Status: None   Collection Time: 04/13/19 10:05 PM   Specimen: Nasopharyngeal  Result Value Ref Range Status   MRSA by PCR NEGATIVE NEGATIVE Final    Comment:        The GeneXpert MRSA Assay (FDA approved for NASAL specimens only), is one component of a comprehensive MRSA colonization surveillance program. It is not intended to diagnose MRSA infection nor to guide or monitor treatment for MRSA infections. Performed at [facility], [place] [address] [place], [place] [address]   Respiratory Panel by PCR     Status: None   Collection Time: 04/14/19  3:16 AM   Specimen: Nasopharyngeal Swab; Respiratory  Result Value Ref Range Status   Adenovirus NOT DETECTED NOT DETECTED Final   Coronavirus 229E NOT DETECTED NOT DETECTED Final    Comment: (NOTE) The Coronavirus on the Respiratory Panel, DOES NOT test for the novel  Coronavirus (2019 nCoV)    Coronavirus HKU1 NOT DETECTED NOT DETECTED Final   Coronavirus NL63 NOT DETECTED NOT DETECTED Final   Coronavirus OC43 NOT DETECTED NOT DETECTED Final   Metapneumovirus NOT DETECTED NOT DETECTED Final   Rhinovirus / Enterovirus NOT DETECTED NOT DETECTED Final   Influenza A NOT DETECTED NOT DETECTED Final   Influenza B NOT DETECTED NOT DETECTED Final   Parainfluenza Virus 1 NOT DETECTED NOT DETECTED Final   Parainfluenza Virus 2 NOT DETECTED NOT DETECTED Final   Parainfluenza Virus 3 NOT DETECTED NOT DETECTED Final   Parainfluenza  Virus 4 NOT DETECTED NOT DETECTED Final   Respiratory Syncytial Virus NOT DETECTED NOT DETECTED Final   Bordetella pertussis NOT DETECTED NOT DETECTED Final   Chlamydophila pneumoniae NOT DETECTED NOT DETECTED Final   Mycoplasma pneumoniae NOT DETECTED NOT DETECTED Final    Comment: Performed at [facility], [place]. [address] [place], [place] [address]  Culture, respiratory (non-expectorated)     Status: None   Collection Time: 04/14/19  3:16 AM   Specimen: Tracheal Aspirate; Respiratory  Result Value Ref Range Status   Specimen Description TRACHEAL ASPIRATE  Final   Special Requests NONE  Final   Gram Stain   Final    MODERATE WBC PRESENT,BOTH PMN AND MONONUCLEAR NO ORGANISMS SEEN    Culture   Final    Consistent with normal respiratory flora. Performed at [facility], [place] [address] [place], [place] [address]    Report Status 04/16/2019 FINAL  Final      Radiology Studies: No results found.      Scheduled Meds: . amiodarone  100 mg Oral Daily  . apixaban  5 mg Oral BID  . arformoterol  15 mcg Nebulization BID  . artificial tears  1 application Right Eye QHS  . carvedilol  12.5 mg Oral BID WC  . chlorhexidine gluconate (MEDLINE KIT)  15 mL Mouth Rinse BID  . Chlorhexidine Gluconate Cloth  6 each Topical Daily  . docusate sodium  100 mg Oral Daily  . famotidine  20 mg Oral Daily  . insulin aspart  0-20 Units Subcutaneous TID WC  . insulin aspart  0-5 Units Subcutaneous QHS  . insulin aspart  5 Units Subcutaneous TID WC  . insulin glargine  45 Units Subcutaneous QHS  . isosorbide mononitrate  60 mg Oral Daily  . mouth rinse  15 mL Mouth Rinse q12n4p  . polyethylene glycol  17 g Oral Daily  . potassium chloride  20 mEq Oral Daily  . rosuvastatin  20 mg Oral q1800  . tobramycin-dexamethasone  2 drop Right Eye Q6H  . torsemide  60 mg Oral BID   Continuous Infusions: . sodium chloride 10 mL/hr at 04/16/19 0600    Assessment & Plan:    1.Acute on  chronic respiratory failure with hypoxia and hypercapnia: Likely multifactorial in the setting of untreated sleep apnea, obesity hypoventilation, COPD, narcotic use and mild acute diastolic CHF: Patient required mechanical ventilation 8/23 through 8/25.  He was treated with IV diuretics, steroids and empiric antibiotics.  Currently back to baseline O2 needs of 3 L continuous/home diuretic regimen.  Not been very compliant with BiPAP/CPAP here.  Discussed with RT and patient complied with BIPAP overnight although complaining that he couldn't sleep well. Will request RT to f/u and see if we can titrate further. Trilogy being set up for home use in am.  Patient and wife advised the importance of compliance with NIV to avoid recurrent hypercapnic respiratory failure related admissions and encephalopathy.  Continue inhaler therapy as recommended by pulmonary.  2.  Acute metabolic encephalopathy: Present on admission in relation to problem #1/hypercapnia and UTI.  Now back to baseline  3.  Proteus UTI/sepsis: s/p 5 day course ceftriaxone.  Urine cultures did show multidrug resistance but sensitive to ceftriaxone.  Blood cultures negative.  Patient still has some leukocytosis but afebrile.  Patient appears to have received steroids during the ICU course which might explain persistent leukocytosis  4.  Paroxysmal atrial Fibrillation : On presentation rhythm sinus and rate controlled. CHADSVASC2 >4. On Eliquis 5 mg, Coreg and amiodarone 123m at home, which have been resumed  5.  Hypertension with LVH/acute on chronic diastolic CHF: Admission chest x-ray did show cardiogenic pulmonary edema and patient diuresed with IV Lasix during ICU stay.  Now back on oral torsemide.  Resume home antihypertensive therapy including Coreg/Imdur  6.  Acute on CKD stage III: Creatinine was 2.5 on presentation and peaked to 3.5 during hospitalization on diuretics. Improved to 3.0- today at 3.2 and sodium at 129 with home Torsemide  dose. Will hold PM diuretic dose, 2014mIV hydration and labs in am.    7.  CAD: Patient apparently failed CABG.  EKG unremarkable in this admission.  Resumed on home medications including Crestor, Coreg, Imdur and medical therapy for A. fib including anticoagulation.  Not sure if patient on additional aspirin at home.  8.Right-sided facial droop: Patient has history of acoustic neuroma, status post craniotomy with right facial nerve paralysis .  9. Diabetes mellitus: Continue Lantus insulin and pre-meal insulin with sliding scale coverage  10.?  Hypothyroidism: Repeat levels as outpatient.  TSH low normal at 0.8.  Not on any  home medications.  11. Hypokalemia: in the setting of diuretics. Replace daily and as needed  DVT prophylaxis: On anticoagulation Code Status: Full code Family / Patient Communication: Discussed with patient and wife: Current plan, importance of compliance with NIV and d/c plan discussed Disposition Plan: In next 24 to 48 hours when trilogy set up.     LOS: 7 days    Time spent: 35 minutes    Guilford Shi, MD [facility] Pager [phone]  If 7PM-7AM, please contact night-coverage [email or web address] Password [email or web address] 04/20/2019, 1:58 PM

## 2019-04-20 NOTE — Progress Notes (Signed)
RT NOTE:  Pt wants RT to return @ 0100 to assist with BIPAP.

## 2019-04-21 LAB — BASIC METABOLIC PANEL
Anion gap: 21 — ABNORMAL HIGH (ref 5–15)
BUN: 94 mg/dL — ABNORMAL HIGH (ref 6–20)
CO2: 30 mmol/L (ref 22–32)
Calcium: 8.8 mg/dL — ABNORMAL LOW (ref 8.9–10.3)
Chloride: 79 mmol/L — ABNORMAL LOW (ref 98–111)
Creatinine, Ser: 3.1 mg/dL — ABNORMAL HIGH (ref 0.61–1.24)
GFR calc Af Amer: 24 mL/min — ABNORMAL LOW (ref >60)
GFR calc non Af Amer: 21 mL/min — ABNORMAL LOW (ref >60)
Glucose, Bld: 244 mg/dL — ABNORMAL HIGH (ref 70–99)
Potassium: 2.9 mmol/L — ABNORMAL LOW (ref 3.5–5.1)
Sodium: 130 mmol/L — ABNORMAL LOW (ref 135–145)

## 2019-04-21 LAB — GLUCOSE, CAPILLARY
Glucose-Capillary: 249 mg/dL — ABNORMAL HIGH (ref 70–99)
Glucose-Capillary: 339 mg/dL — ABNORMAL HIGH (ref 70–99)
Glucose-Capillary: 351 mg/dL — ABNORMAL HIGH (ref 70–99)
Glucose-Capillary: 262 mg/dL — ABNORMAL HIGH (ref 70–99)

## 2019-04-21 MED ORDER — TORSEMIDE 20 MG PO TABS
40.0000 mg | ORAL_TABLET | Freq: Two times a day (BID) | ORAL | Status: DC
  Administered 2019-04-21 – 2019-04-24 (×7): 40 mg via ORAL
  Filled 2019-04-21 (×6): qty 2

## 2019-04-21 MED ORDER — PRO-STAT SUGAR FREE PO LIQD
30.0000 mL | Freq: Two times a day (BID) | ORAL | Status: DC
  Administered 2019-04-21 – 2019-04-24 (×7): 30 mL via ORAL
  Filled 2019-04-21 (×6): qty 30

## 2019-04-21 MED ORDER — INSULIN ASPART 100 UNIT/ML ~~LOC~~ SOLN
8.0000 [IU] | Freq: Three times a day (TID) | SUBCUTANEOUS | Status: DC
  Administered 2019-04-21 – 2019-04-22 (×3): 8 [IU] via SUBCUTANEOUS

## 2019-04-21 MED ORDER — POTASSIUM CHLORIDE 10 MEQ/100ML IV SOLN
10.0000 meq | INTRAVENOUS | Status: AC
  Administered 2019-04-21 (×2): 10 meq via INTRAVENOUS
  Filled 2019-04-21: qty 100

## 2019-04-21 MED ORDER — INSULIN GLARGINE 100 UNIT/ML ~~LOC~~ SOLN
50.0000 [IU] | Freq: Every day | SUBCUTANEOUS | Status: DC
  Administered 2019-04-21: 50 [IU] via SUBCUTANEOUS
  Filled 2019-04-21 (×2): qty 0.5

## 2019-04-21 MED ORDER — ADULT MULTIVITAMIN W/MINERALS CH
1.0000 | ORAL_TABLET | Freq: Every day | ORAL | Status: DC
  Administered 2019-04-21 – 2019-04-24 (×4): 1 via ORAL
  Filled 2019-04-21 (×3): qty 1

## 2019-04-21 MED ORDER — POTASSIUM CHLORIDE 20 MEQ PO PACK
40.0000 meq | PACK | Freq: Two times a day (BID) | ORAL | Status: DC
  Administered 2019-04-21 – 2019-04-24 (×6): 40 meq via ORAL
  Filled 2019-04-21 (×7): qty 2

## 2019-04-21 NOTE — Plan of Care (Signed)
  Problem: Activity: Goal: Risk for activity intolerance will decrease Outcome: Progressing   Problem: Nutrition: Goal: Adequate nutrition will be maintained Outcome: Progressing   Problem: Coping: Goal: Level of anxiety will decrease Outcome: Progressing   Problem: Education: Goal: Knowledge of General Education information will improve Description: Including pain rating scale, medication(s)/side effects and non-pharmacologic comfort measures Outcome: Adequate for Discharge

## 2019-04-21 NOTE — Progress Notes (Addendum)
PROGRESS NOTE    [name].  ZOX:[id number]  DOB: 01/22/1961  DOA: 04/13/2019 PCP: Caren Macadam, MD  Brief Narrative:  58 year old morbidly obese man with history of Obstructive sleep apnea, obesity hypoventilation, not on any CPAP or BiPAP at home, COPD, chronic respiratory failure with hypoxia (uses 3 L nasal cannula) and hypercapnia, IDDM, coronary artery disease status post prior CABG, atrial fibrillation, CKD stage IV and diastolic congestive heart failure brought to the emergency room where he was found very somnolent.  Patient on 3 L of oxygen at home.  In the emergency room oxygen was 80, tidal CO2 77, bilateral wheezing with diminished sounds.  ABG showed pH 7.18, CO2 more than 97. He was hypothermic; given hypoxia and AMS,initiated code sepsis with cultures and broad-spectrum antibiotics. Initial VBG concerning with pH 7.18, CO2 greater than 97.  Patient was started on HFNC FiO2100% @ 60L. Patient eventually decompensated and became more somnolent, unresponsive to verbal stimuli, and progressively more hypoxic and was intubated. He was on mechanical ventilation (04/13/2019-04/15/2019) and managed in the ICU. Patient has had  multiple admissions for altered mental status and hypercapnia with most recent discharge on 8/6. He reports using 3 lits continuous [place] at baseline.   Subjective:  Patient did not tolerate BIPAP last night per RT note. He states he couldn't sleep with air blowing on his face. Triology to be set up today  Objective: Vitals:   04/21/19 0645 04/21/19 0735 04/21/19 0802 04/21/19 1715  BP:   96/60 115/73  Pulse:   74 85  Resp:   20 18  Temp:   98.3 F (36.8 C) 98.4 F (36.9 C)  TempSrc:   Oral Oral  SpO2:  97% 99% 100%  Weight: (!) 158.9 kg     Height:        Intake/Output Summary (Last 24 hours) at 04/21/2019 1741 Last data filed at 04/21/2019 8119 Gross per 24 hour  Intake 460 ml  Output 2000 ml  Net -1540 ml   Filed Weights   04/18/19 0500 04/19/19 0500 04/21/19 0645  Weight: (!) 160.9 kg (!) 159.8 kg (!) 158.9 kg    Physical Examination:  General exam: Appears calm and comfortable  Respiratory system: Clear to auscultation. Respiratory effort normal. Cardiovascular system: S1 & S2 heard, RRR. No JVD, murmurs, rubs, gallops or clicks. Gastrointestinal system: Abdomen is obese, soft and nontender. Normal bowel sounds heard. Central nervous system: Alert and oriented.  Right facial droop (old) Extremities: 2+ pitting lower extremity edema, bilateral with stasis dermatitis Skin: ecchymosis and swelling at IV site along right wrist Psychiatry: Judgement and insight appear normal. Mood & affect appropriate.     Data Reviewed: I have personally reviewed following labs and imaging studies  CBC: Recent Labs  Lab 04/15/19 0313 04/16/19 0308 04/16/19 0354 04/17/19 0357 04/18/19 0721  WBC 14.2* 14.9*  --  10.7* 14.9*  NEUTROABS  --   --   --   --  10.9*  HGB 11.6* 12.0* 13.9 11.9* 14.7  HCT 39.3 39.3 41.0 40.0 47.0  MCV 99.5 97.8  --  98.3 94.4  PLT 230 256  --  199 147   Basic Metabolic Panel: Recent Labs  Lab 04/15/19 0313 04/16/19 0308 04/16/19 0354 04/17/19 0357 04/18/19 0721 04/20/19 0739 04/21/19 0818  NA 141 141 140 139 137 129* 130*  K 3.0* 2.8* 2.9* 3.2* 3.3* 3.2* 2.9*  CL 93* 87*  --  89* 86* 78* 79*  CO2 33* 36*  --  34* 34* 29 30  GLUCOSE 303* 273*  --  238* 207* 286* 244*  BUN 61* 81*  --  85* 91* 98* 94*  CREATININE 3.32* 3.50*  --  3.12* 3.09* 3.20* 3.10*  CALCIUM 9.3 8.9  --  9.0 9.4 8.9 8.8*  MG  --  2.3  --   --  2.3  --   --   PHOS 3.0 4.0   4.1  --  4.8* 3.7  --   --    GFR: Estimated Creatinine Clearance: 39.9 mL/min (A) (by C-G formula based on SCr of 3.1 mg/dL (H)). Liver Function Tests: Recent Labs  Lab 04/15/19 0313 04/16/19 0308 04/17/19 0357  ALBUMIN 3.2* 3.2* 3.1*   No results for input(s): LIPASE, AMYLASE in the last 168 hours. No results for input(s):  AMMONIA in the last 168 hours. Coagulation Profile: No results for input(s): INR, PROTIME in the last 168 hours. Cardiac Enzymes: No results for input(s): CKTOTAL, CKMB, CKMBINDEX, TROPONINI in the last 168 hours. BNP (last 3 results) No results for input(s): PROBNP in the last 8760 hours. HbA1C: No results for input(s): HGBA1C in the last 72 hours. CBG: Recent Labs  Lab 04/20/19 1648 04/20/19 2140 04/21/19 0759 04/21/19 1157 04/21/19 1641  GLUCAP 272* 250* 249* 339* 351*   Lipid Profile: No results for input(s): CHOL, HDL, LDLCALC, TRIG, CHOLHDL, LDLDIRECT in the last 72 hours. Thyroid Function Tests: No results for input(s): TSH, T4TOTAL, FREET4, T3FREE, THYROIDAB in the last 72 hours. Anemia Panel: No results for input(s): VITAMINB12, FOLATE, FERRITIN, TIBC, IRON, RETICCTPCT in the last 72 hours. Sepsis Labs: No results for input(s): PROCALCITON, LATICACIDVEN in the last 168 hours.  Recent Results (from the past 240 hour(s))  Urine culture     Status: Abnormal   Collection Time: 04/13/19  4:44 PM   Specimen: In/Out Cath Urine  Result Value Ref Range Status   Specimen Description IN/OUT CATH URINE  Final   Special Requests   Final    NONE Performed at [facility], [address] [place], [place] [address]    Culture 80,000 COLONIES/mL PROTEUS PENNERI (A)  Final   Report Status 04/16/2019 FINAL  Final   Organism ID, Bacteria PROTEUS PENNERI (A)  Final      Susceptibility   Proteus penneri - MIC*    AMPICILLIN >=32 RESISTANT Resistant     CEFAZOLIN >=64 RESISTANT Resistant     CEFTRIAXONE <=1 SENSITIVE Sensitive     CIPROFLOXACIN >=4 RESISTANT Resistant     GENTAMICIN <=1 SENSITIVE Sensitive     IMIPENEM 0.5 SENSITIVE Sensitive     NITROFURANTOIN 128 RESISTANT Resistant     TRIMETH/SULFA <=20 SENSITIVE Sensitive     AMPICILLIN/SULBACTAM 16 INTERMEDIATE Intermediate     PIP/TAZO <=4 SENSITIVE Sensitive     * 80,000 COLONIES/mL PROTEUS PENNERI  Blood  Culture (routine x 2)     Status: None   Collection Time: 04/13/19  4:45 PM   Specimen: BLOOD RIGHT WRIST  Result Value Ref Range Status   Specimen Description BLOOD RIGHT WRIST  Final   Special Requests   Final    BOTTLES DRAWN AEROBIC AND ANAEROBIC Blood Culture adequate volume   Culture   Final    NO GROWTH 5 DAYS Performed at [facility], [address] [place], [place] [address]    Report Status 04/18/2019 FINAL  Final  SARS CORONAVIRUS 2 Nasal Swab Aptima Multi Swab     Status: None   Collection  Time: 04/13/19  5:11 PM   Specimen: Aptima Multi Swab; Nasal Swab  Result Value Ref Range Status   SARS Coronavirus 2 NEGATIVE NEGATIVE Final    Comment: (NOTE) SARS-CoV-2 target nucleic acids are NOT DETECTED. The SARS-CoV-2 RNA is generally detectable in upper and lower respiratory specimens during the acute phase of infection. Negative results do not preclude SARS-CoV-2 infection, do not rule out co-infections with other pathogens, and should not be used as the sole basis for treatment or other patient management decisions. Negative results must be combined with clinical observations, patient history, and epidemiological information. The expected result is Negative. Fact Sheet for Patients: SugarRoll.be Fact Sheet for Healthcare Providers: https://www.woods-mathews.com/ This test is not yet approved or cleared by the [place] FDA and  has been authorized for detection and/or diagnosis of SARS-CoV-2 by FDA under an Emergency Use Authorization (EUA). This EUA will remain  in effect (meaning this test can be used) for the duration of the COVID-19 declaration under Section 56 4(b)(1) of the Act, 21 U.S.C. section 360bbb-3(b)(1), unless the authorization is terminated or revoked sooner. Performed at [facility], [place] [address] [place], [place] [address]   Novel Coronavirus, NAA (hospital order; send-out to ref lab)      Status: None   Collection Time: 04/13/19  5:11 PM   Specimen: Nasopharyngeal Swab; Respiratory  Result Value Ref Range Status   SARS-CoV-2, NAA NOT DETECTED NOT DETECTED Final    Comment: (NOTE) This test was developed and its performance characteristics determined by [employer]. This test has not been FDA cleared or approved. This test has been authorized by FDA under an Emergency Use Authorization (EUA). This test is only authorized for the duration of time the declaration that circumstances exist justifying the authorization of the emergency use of in vitro diagnostic tests for detection of SARS-CoV-2 virus and/or diagnosis of COVID-19 infection under section 564(b)(1) of the Act, 21 U.S.C. 696EXB-2(W)(4), unless the authorization is terminated or revoked sooner. When diagnostic testing is negative, the possibility of a false negative result should be considered in the context of a patient's recent exposures and the presence of clinical signs and symptoms consistent with COVID-19. An individual without symptoms of COVID-19 and who is not shedding SARS-CoV-2 virus would expect to have a negative (not detected) result in this assay. Performed  At: [facility] [address] [place], [place] [id number] Rush Farmer MD VO:[id number]    [place]  Final    Comment: Performed at [facility], [place] [address], [place], [place] [address]  Blood Culture (routine x 2)     Status: None   Collection Time: 04/13/19  5:15 PM   Specimen: BLOOD LEFT HAND  Result Value Ref Range Status   Specimen Description BLOOD LEFT HAND  Final   Special Requests   Final    BOTTLES DRAWN AEROBIC AND ANAEROBIC Blood Culture results may not be optimal due to an inadequate volume of blood received in culture bottles   Culture   Final    NO GROWTH 5 DAYS Performed at [facility], [place] [address], [place], [place] [address]    Report Status 04/18/2019  FINAL  Final  MRSA PCR Screening     Status: None   Collection Time: 04/13/19 10:05 PM   Specimen: Nasopharyngeal  Result Value Ref Range Status   MRSA by PCR NEGATIVE NEGATIVE Final    Comment:        The GeneXpert MRSA  Assay (FDA approved for NASAL specimens only), is one component of a comprehensive MRSA colonization surveillance program. It is not intended to diagnose MRSA infection nor to guide or monitor treatment for MRSA infections. Performed at [facility], [place] [address] [place], [place] [address]   Respiratory Panel by PCR     Status: None   Collection Time: 04/14/19  3:16 AM   Specimen: Nasopharyngeal Swab; Respiratory  Result Value Ref Range Status   Adenovirus NOT DETECTED NOT DETECTED Final   Coronavirus 229E NOT DETECTED NOT DETECTED Final    Comment: (NOTE) The Coronavirus on the Respiratory Panel, DOES NOT test for the novel  Coronavirus (2019 nCoV)    Coronavirus HKU1 NOT DETECTED NOT DETECTED Final   Coronavirus NL63 NOT DETECTED NOT DETECTED Final   Coronavirus OC43 NOT DETECTED NOT DETECTED Final   Metapneumovirus NOT DETECTED NOT DETECTED Final   Rhinovirus / Enterovirus NOT DETECTED NOT DETECTED Final   Influenza A NOT DETECTED NOT DETECTED Final   Influenza B NOT DETECTED NOT DETECTED Final   Parainfluenza Virus 1 NOT DETECTED NOT DETECTED Final   Parainfluenza Virus 2 NOT DETECTED NOT DETECTED Final   Parainfluenza Virus 3 NOT DETECTED NOT DETECTED Final   Parainfluenza Virus 4 NOT DETECTED NOT DETECTED Final   Respiratory Syncytial Virus NOT DETECTED NOT DETECTED Final   Bordetella pertussis NOT DETECTED NOT DETECTED Final   Chlamydophila pneumoniae NOT DETECTED NOT DETECTED Final   Mycoplasma pneumoniae NOT DETECTED NOT DETECTED Final    Comment: Performed at [facility], [place]. [address] [place], [place] [address]  Culture, respiratory (non-expectorated)     Status: None   Collection Time: 04/14/19  3:16 AM   Specimen: Tracheal  Aspirate; Respiratory  Result Value Ref Range Status   Specimen Description TRACHEAL ASPIRATE  Final   Special Requests NONE  Final   Gram Stain   Final    MODERATE WBC PRESENT,BOTH PMN AND MONONUCLEAR NO ORGANISMS SEEN    Culture   Final    Consistent with normal respiratory flora. Performed at [facility], [place] [address] [place], [place] [address]    Report Status 04/16/2019 FINAL  Final      Radiology Studies: No results found.      Scheduled Meds:  amiodarone  100 mg Oral Daily   apixaban  5 mg Oral BID   arformoterol  15 mcg Nebulization BID   artificial tears  1 application Right Eye QHS   carvedilol  12.5 mg Oral BID WC   chlorhexidine gluconate (MEDLINE KIT)  15 mL Mouth Rinse BID   Chlorhexidine Gluconate Cloth  6 each Topical Daily   docusate sodium  100 mg Oral Daily   famotidine  20 mg Oral Daily   feeding supplement (PRO-STAT SUGAR FREE 64)  30 mL Oral BID   insulin aspart  0-20 Units Subcutaneous TID WC   insulin aspart  0-5 Units Subcutaneous QHS   insulin aspart  8 Units Subcutaneous TID WC   insulin glargine  50 Units Subcutaneous QHS   isosorbide mononitrate  60 mg Oral Daily   mouth rinse  15 mL Mouth Rinse q12n4p   multivitamin with minerals  1 tablet Oral Daily   polyethylene glycol  17 g Oral Daily   potassium chloride  40 mEq Oral BID   rosuvastatin  20 mg Oral q1800   tobramycin-dexamethasone  2 drop Right Eye Q6H   torsemide  40 mg Oral BID  Continuous Infusions:  sodium chloride 10 mL/hr at 04/16/19 0600    Assessment & Plan:    1.Acute on chronic respiratory failure with hypoxia and hypercapnia: Likely multifactorial in the setting of untreated sleep apnea, obesity hypoventilation, COPD, narcotic use and mild acute diastolic CHF: Patient required mechanical ventilation 8/23 through 8/25.  He was treated with IV diuretics, steroids and empiric antibiotics.  Currently back to baseline O2 needs of 3 L  continuous/home diuretic regimen.  Not been very compliant with BiPAP/CPAP here.  Discussed with RT and patient complied with BIPAP on Saturday night and only for few hours last night, complaining that he cant sleep well. Trilogy being set up for home use today (wife works till [employer]).  D/W Pulmonary regarding this and no additional recommendations than to hope that will tolerate Trilogy. If he were to present again hypercapneic respiratory failure , may end up needing tracheostomy per Dr Nelda Marseille.  Patient and wife advised the importance of compliance with NIV to avoid recurrent hypercapnic respiratory failure related admissions and encephalopathy. Continue inhaler therapy as recommended by pulmonary.  2.  Acute metabolic encephalopathy: Present on admission in relation to problem #1/hypercapnia and UTI.  Now back to baseline  3.  Acute on CKD stage III: Creatinine was 2.5 on presentation and peaked to 3.5 during hospitalization on diuretics-likely cardiorenal syndrome. Improved to 3.0 but up-trended again over the weekend (3.2) with hyponatremia ( sodium at 129) on home dose Torsemide 43m BID. Held PM diuretic dose on sunday, received 2051mIV hydration and labs somewhat improved today although still very hypokalemic. Resume Torsemide 40 mg BID after replacement of potassium and repeat labs in am.  If tolerates well, can discharge on prior dose of 60 mg twice daily with potassium replacement of 40 mEq twice daily.  4.  Paroxysmal atrial Fibrillation : On presentation rhythm sinus and rate controlled. CHADSVASC2 >4. On Eliquis 5 mg, Coreg and amiodarone 10071mt home, which have been resumed  5.  Hypertension with LVH/acute on chronic diastolic CHF: Admission chest x-ray did show cardiogenic pulmonary edema and patient diuresed with IV Lasix during ICU stay.  Now back on oral torsemide.  Resume home antihypertensive therapy including Coreg/Imdur  6. Proteus UTI/sepsis: s/p 5 day course ceftriaxone.  Urine  cultures did show multidrug resistance but sensitive to ceftriaxone.  Blood cultures negative.  Patient still has some leukocytosis but afebrile.  Patient appears to have received steroids during the ICU course which might explain persistent leukocytosis   7.  CAD: Patient apparently failed CABG.  EKG unremarkable in this admission.  Resumed on home medications including Crestor, Coreg, Imdur and medical therapy for A. fib including anticoagulation.  Not sure if patient on additional aspirin at home. Has easy bruising. On anticoag for A.fib.  8.Right-sided facial droop: Patient has history of acoustic neuroma, status post craniotomy with right facial nerve paralysis .  9. Diabetes mellitus: Continue Lantus insulin and pre-meal insulin with sliding scale coverage  10.?  Hypothyroidism: Repeat levels as outpatient.  TSH low normal at 0.8.  Not on any home medications.  11. Hypokalemia: in the setting of diuretics. Replace daily and as needed  12. Hand swelling/ecchymosis at IV site: warm compresses for mild phlebitis  DVT prophylaxis: On anticoagulation Code Status: Full code Family / Patient Communication: Discussed with wife yesterday. D/W Patient again today : Current plan, importance of compliance with NIV, potential tracheostomy need in the future (he had one in the past for head and neck surgery) etc  Disposition Plan: In next 24 to 48 hours when trilogy set up and electrolytes corrected D/W Pulmonary    LOS: 8 days    Time spent: 35 minutes    [place] Shi, MD [facility] Pager [phone]  If 7PM-7AM, please contact night-coverage [email or web address] Password [facility] 04/21/2019, 5:41 PM

## 2019-04-21 NOTE — Progress Notes (Signed)
Physical Therapy Treatment Patient Details Name: [name]. MRN: [id number] DOB: 25-Apr-1961 Today's Date: 04/21/2019    History of Present Illness Pt is a 58 year old man admitted 04/13/19 with SOB and hypoxia requiring intubation. Extubated 8/25. PMH: morbid obesity, OSA, chronic respiratory failure with hypoxia and hypercapnia, DM, afib, CAD, failed CABG, CKD 4, CHF, CVA, acoustic neuroma. This is pt's 3rd admission in 6 months.    PT Comments    Pt's mobility very close to or at baseline. Ready for DC home from PT standpoint.    Follow Up Recommendations  No PT follow up;Supervision - Intermittent     Equipment Recommendations  None recommended by PT    Recommendations for Other Services       Precautions / Restrictions Precautions Precautions: Fall Precaution Comments: blind in R eye Restrictions Weight Bearing Restrictions: No    Mobility  Bed Mobility               General bed mobility comments: Pt up in the chair  Transfers Overall transfer level: Needs assistance Equipment used: 4-wheeled walker Transfers: Sit to/from Stand Sit to Stand: Supervision         General transfer comment: Supervision for lines.   Ambulation/Gait Ambulation/Gait assistance: Supervision Gait Distance (Feet): 12 Feet(x 2) Assistive device: 4-wheeled walker Gait Pattern/deviations: Step-through pattern;Trunk flexed;Wide base of support;Decreased stride length Gait velocity: decr Gait velocity interpretation: 1.31 - 2.62 ft/sec, indicative of limited community ambulator General Gait Details: Assist for lines. Distance self limited   Stairs             Wheelchair Mobility    Modified Rankin (Stroke Patients Only)       Balance Overall balance assessment: Needs assistance Sitting-balance support: No upper extremity supported;Feet supported Sitting balance-Leahy Scale: Fair     Standing balance support: Bilateral upper extremity  supported Standing balance-Leahy Scale: Poor Standing balance comment: UE support                            Cognition Arousal/Alertness: Awake/alert Behavior During Therapy: Flat affect Overall Cognitive Status: Impaired/Different from baseline Area of Impairment: Attention;Memory;Safety/judgement;Awareness;Problem solving                   Current Attention Level: Sustained Memory: Decreased short-term memory   Safety/Judgement: Decreased awareness of safety Awareness: Intellectual Problem Solving: Requires verbal cues General Comments: Suspect pt has baseline deficits      Exercises      General Comments        Pertinent Vitals/Pain Pain Assessment: No/denies pain    Home Living                      Prior Function            PT Goals (current goals can now be found in the care plan section) Progress towards PT goals: Progressing toward goals    Frequency    Min 3X/week      PT Plan Current plan remains appropriate    Co-evaluation              AM-PAC PT "6 Clicks" Mobility   Outcome Measure  Help needed turning from your back to your side while in a flat bed without using bedrails?: A Little Help needed moving from lying on your back to sitting on the side of a flat bed without using bedrails?: A Little Help needed moving to  and from a bed to a chair (including a wheelchair)?: None Help needed standing up from a chair using your arms (e.g., wheelchair or bedside chair)?: None Help needed to walk in hospital room?: None Help needed climbing 3-5 steps with a railing? : A Lot 6 Click Score: 20    End of Session Equipment Utilized During Treatment: Oxygen Activity Tolerance: Patient tolerated treatment well Patient left: in chair;with call bell/phone within reach;with chair alarm set Nurse Communication: Mobility status PT Visit Diagnosis: Muscle weakness (generalized) (M62.81);Difficulty in walking, not elsewhere  classified (R26.2)     Time: 2174-7159 PT Time Calculation (min) (ACUTE ONLY): 19 min  Charges:  $Gait Training: 8-22 mins                     [place] Pager [phone] Office [place] 04/21/2019, 1:33 PM

## 2019-04-21 NOTE — Progress Notes (Signed)
   Vital Signs MEWS/VS Documentation      04/20/2019 2030 04/20/2019 2144 04/20/2019 2236 04/20/2019 2300   MEWS Score:  0  0  2  0   MEWS Score Color:  Green  Green  Yellow  Green   Resp:  19  20  -  -   Pulse:  72  94  -  -   BP:  -  119/76  -  -   Temp:  -  97.8 F (36.6 C)  -  -   O2 Device:  Nasal Cannula  Nasal Cannula  -  -   O2 Flow Rate (L/min):  3 L/min  3 L/min  -  -   Level of Consciousness:  -  -  -  Alert     Elevated pulse (118) is a result of patient ambulating from bathroom.  All other vitals are normal.        Pria Klosinski 04/21/2019,1:52 AM .

## 2019-04-21 NOTE — Progress Notes (Signed)
RT NOTE:  RT attempted to place BIPAP on patient. Pt complained he felt he was suffocating. He stated he did not sleep the night before because it was not comfortable and the mask bothered him. RT adjusted settings multiple times without success. Pt doesn't wear BIPAP at home. Pt back on 3L and resting.

## 2019-04-21 NOTE — Progress Notes (Signed)
Inpatient Diabetes Program Recommendations  AACE/ADA: New Consensus Statement on Inpatient Glycemic Control (2015)  Target Ranges:  Prepandial:   less than 140 mg/dL      Peak postprandial:   less than 180 mg/dL (1-2 hours)      Critically ill patients:  140 - 180 mg/dL   Lab Results  Component Value Date   GLUCAP 249 (H) 04/21/2019   HGBA1C 7.8 (H) 04/14/2019    Review of Glycemic Control Results for [name], POLLACK (MRN [id number]) as of 04/21/2019 11:04  Ref. Range 04/20/2019 07:57 04/20/2019 12:21 04/20/2019 16:48 04/20/2019 21:40 04/21/2019 07:59  Glucose-Capillary Latest Ref Range: 70 - 99 mg/dL 321 (H) 254 (H) 272 (H) 250 (H) 249 (H)   Home DM Meds: Lantus 45 units QHS   Current Orders: Lantus 45 units QHS                            Novolog Resistant Correction Scale/ SSI (0-20 units) TID AC + HS                            Novolog 5 units TID with meals  Inpatient Diabetes Program Recommendations:    Consider increasing Lantus to 50 Consider increasing Novolog meal coverage to 8 units tid with meals.    Thanks  Tama Headings RN, MSN, BC-ADM Inpatient Diabetes Coordinator Team Pager [phone] (8a-5p)

## 2019-04-21 NOTE — Progress Notes (Signed)
Nutrition Follow-up  DOCUMENTATION CODES:   Morbid obesity  INTERVENTION:    30 ml Prostat BID, each supplement provides 100 kcals and 15 grams protein.   MVI daily   NUTRITION DIAGNOSIS:   Increased nutrient needs related to acute illness as evidenced by estimated needs.  Ongoing  GOAL:   Patient will meet greater than or equal to 90% of their needs  Met PO  MONITOR:   Supplement acceptance, PO intake, Labs, Weight trends, I & O's  REASON FOR ASSESSMENT:   Ventilator, Consult Enteral/tube feeding initiation and management  ASSESSMENT:   Patient with PMH significant for DM, obesity hypoventilation, OSA, CAD, CVA, failed CABG in 2014, CKD IV, CHF, and COPD. Presents this admission with acute encephalopathy likely secondary to hypercapnia.   8/25-extubated   RD working remotely.  Spoke with pt via phone. Appetite great s/p extubation. Meal completions charted as 80-100% for pt's last eight meals. PTA pt denies loss in appetite or unintentional wt loss. Endorses a UBW of 388 lb. RD to provide protein supplementation to promote wound healing.   CBGs remain elevated- plan increase in Lantus to 50 and novolog 8 units with meals.   Admission weight: 142.9 kg Current weight: 158.9 kg   I/O: -25,160 ml since admit UOP: 3,700 ml x 24 hrs    Medications: colace, SS novolog, lantus, miralax, 40 mEq KCl BID, 40 mg BID demadex Labs: Na 130 (L) Na 2.9 (L) CBG 244-286 Cr 3.10- trending down   Diet Order:   Diet Order            Diet Carb Modified Fluid consistency: Thin; Room service appropriate? Yes  Diet effective now              EDUCATION NEEDS:   Education needs have been addressed  Skin:  Skin Assessment: Skin Integrity Issues: Skin Integrity Issues:: DTI, Stage II DTI: sacrum Stage II: buttocks  Last BM:  8/30  Height:   Ht Readings from Last 1 Encounters:  04/13/19 5' 11"  (1.803 m)    Weight:   Wt Readings from Last 1 Encounters:  04/21/19  (!) 158.9 kg    Ideal Body Weight:  78.2 kg  BMI:  Body mass index is 48.86 kg/m.  Estimated Nutritional Needs:   Kcal:  2000-2200 kcal  Protein:  100-120 grams  Fluid:  >/= 2 L/day   Mariana Single RD, LDN Clinical Nutrition Pager # - [phone]

## 2019-04-22 ENCOUNTER — Inpatient Hospital Stay (HOSPITAL_COMMUNITY): Payer: HMO

## 2019-04-22 ENCOUNTER — Other Ambulatory Visit: Payer: Self-pay

## 2019-04-22 ENCOUNTER — Encounter (HOSPITAL_COMMUNITY): Payer: HMO

## 2019-04-22 LAB — COMPREHENSIVE METABOLIC PANEL
ALT: 29 U/L (ref 0–44)
AST: 25 U/L (ref 15–41)
Albumin: 3.6 g/dL (ref 3.5–5.0)
Alkaline Phosphatase: 229 U/L — ABNORMAL HIGH (ref 38–126)
Anion gap: 17 — ABNORMAL HIGH (ref 5–15)
BUN: 78 mg/dL — ABNORMAL HIGH (ref 6–20)
CO2: 31 mmol/L (ref 22–32)
Calcium: 8.8 mg/dL — ABNORMAL LOW (ref 8.9–10.3)
Chloride: 84 mmol/L — ABNORMAL LOW (ref 98–111)
Creatinine, Ser: 2.62 mg/dL — ABNORMAL HIGH (ref 0.61–1.24)
GFR calc Af Amer: 30 mL/min — ABNORMAL LOW (ref >60)
GFR calc non Af Amer: 26 mL/min — ABNORMAL LOW (ref >60)
Glucose, Bld: 218 mg/dL — ABNORMAL HIGH (ref 70–99)
Potassium: 3.5 mmol/L (ref 3.5–5.1)
Sodium: 132 mmol/L — ABNORMAL LOW (ref 135–145)
Total Bilirubin: 0.9 mg/dL (ref 0.3–1.2)
Total Protein: 7.5 g/dL (ref 6.5–8.1)

## 2019-04-22 LAB — CBC WITH DIFFERENTIAL/PLATELET
Abs Immature Granulocytes: 0.13 10*3/uL — ABNORMAL HIGH (ref 0.00–0.07)
Basophils Absolute: 0 10*3/uL (ref 0.0–0.1)
Basophils Relative: 0 %
Eosinophils Absolute: 1.5 10*3/uL — ABNORMAL HIGH (ref 0.0–0.5)
Eosinophils Relative: 9 %
HCT: 45.6 % (ref 39.0–52.0)
Hemoglobin: 14.6 g/dL (ref 13.0–17.0)
Immature Granulocytes: 1 %
Lymphocytes Relative: 12 %
Lymphs Abs: 2 10*3/uL (ref 0.7–4.0)
MCH: 29.9 pg (ref 26.0–34.0)
MCHC: 32 g/dL (ref 30.0–36.0)
MCV: 93.4 fL (ref 80.0–100.0)
Monocytes Absolute: 1.9 10*3/uL — ABNORMAL HIGH (ref 0.1–1.0)
Monocytes Relative: 11 %
Neutro Abs: 11.4 10*3/uL — ABNORMAL HIGH (ref 1.7–7.7)
Neutrophils Relative %: 67 %
Platelets: 171 10*3/uL (ref 150–400)
RBC: 4.88 MIL/uL (ref 4.22–5.81)
RDW: 13.6 % (ref 11.5–15.5)
WBC: 16.9 10*3/uL — ABNORMAL HIGH (ref 4.0–10.5)
nRBC: 0 % (ref 0.0–0.2)

## 2019-04-22 LAB — GLUCOSE, CAPILLARY
Glucose-Capillary: 211 mg/dL — ABNORMAL HIGH (ref 70–99)
Glucose-Capillary: 336 mg/dL — ABNORMAL HIGH (ref 70–99)
Glucose-Capillary: 203 mg/dL — ABNORMAL HIGH (ref 70–99)
Glucose-Capillary: 226 mg/dL — ABNORMAL HIGH (ref 70–99)

## 2019-04-22 LAB — PHOSPHORUS: Phosphorus: 4.8 mg/dL — ABNORMAL HIGH (ref 2.5–4.6)

## 2019-04-22 LAB — MAGNESIUM: Magnesium: 2.1 mg/dL (ref 1.7–2.4)

## 2019-04-22 MED ORDER — IPRATROPIUM BROMIDE 0.02 % IN SOLN
0.5000 mg | Freq: Four times a day (QID) | RESPIRATORY_TRACT | Status: DC
  Administered 2019-04-22: 0.5 mg via RESPIRATORY_TRACT
  Filled 2019-04-22: qty 2.5

## 2019-04-22 MED ORDER — GUAIFENESIN ER 600 MG PO TB12
1200.0000 mg | ORAL_TABLET | Freq: Two times a day (BID) | ORAL | Status: DC
  Administered 2019-04-22 – 2019-04-24 (×4): 1200 mg via ORAL
  Filled 2019-04-22 (×4): qty 2

## 2019-04-22 MED ORDER — LEVALBUTEROL HCL 0.63 MG/3ML IN NEBU
0.6300 mg | INHALATION_SOLUTION | Freq: Four times a day (QID) | RESPIRATORY_TRACT | Status: DC
  Administered 2019-04-22: 0.63 mg via RESPIRATORY_TRACT
  Filled 2019-04-22: qty 3

## 2019-04-22 MED ORDER — POTASSIUM CHLORIDE 10 MEQ/100ML IV SOLN
10.0000 meq | INTRAVENOUS | Status: DC
  Filled 2019-04-22 (×3): qty 100

## 2019-04-22 MED ORDER — INSULIN ASPART 100 UNIT/ML ~~LOC~~ SOLN
10.0000 [IU] | Freq: Three times a day (TID) | SUBCUTANEOUS | Status: DC
  Administered 2019-04-22 – 2019-04-23 (×3): 10 [IU] via SUBCUTANEOUS

## 2019-04-22 MED ORDER — POTASSIUM CHLORIDE CRYS ER 20 MEQ PO TBCR
30.0000 meq | EXTENDED_RELEASE_TABLET | Freq: Once | ORAL | Status: AC
  Administered 2019-04-22: 30 meq via ORAL
  Filled 2019-04-22: qty 1

## 2019-04-22 MED ORDER — INSULIN GLARGINE 100 UNIT/ML ~~LOC~~ SOLN
55.0000 [IU] | Freq: Every day | SUBCUTANEOUS | Status: DC
  Administered 2019-04-22 – 2019-04-23 (×2): 55 [IU] via SUBCUTANEOUS
  Filled 2019-04-22 (×3): qty 0.55

## 2019-04-22 NOTE — Patient Outreach (Signed)
  [facility]) Care Management Chronic Special Needs Program   04/22/2019  Name: [name]., DOB: [birth year]  MRN: [id number]  The client was discussed in  An interdisciplinary care team meeting.  The following issues were discussed:  Client's needs, Changes in health status, Care Plan, Coordination of care and Care transitions  Participants present:  Natividad Brood, RNCM(hospital liaison); Thea Silversmith, [facility]  Palliative care consult completed per Dr. Hilma Favors. Per note: palliative Care to follow up with client and his wife post discharge.   Thea Silversmith, RN, MSN, [place] [phone]

## 2019-04-22 NOTE — Consult Note (Signed)
   [facility] CM Inpatient Consult   04/22/2019  Verner Mccrone. Feb 08, 1961 [id number]  Follow up:  Active patient with HTA CSNP. Chart reviewed and care coordination with [place] made aware of request for Palliative Consult was completed on 04/19/2019.  Appreciate the palliative consult and notes reviewed  for recommending home palliative follow up.  Will continue to follow up with inpatient [facility] team for follow up.  Natividad Brood, RN BSN [place] [facility] Liaison  [phone] business mobile phone Toll free office [phone]  Fax number: [phone] [email or web address]

## 2019-04-22 NOTE — Care Management (Addendum)
CM discussed case with attending this am - pt will likely not discharge home today.  Attending for today made aware that [employer] will have  to plan for [facility] set up in advance aspt /wife/ respiratory therapist are required in home for initial set up.  [employer] will arrange for a respiratory therapist to set up equipment once discharge is known.     Per palliative note pt in agreement for palliative in the community referral.  CM spoke to pt and pt informed CM that he is not interested in this resource - no referral made

## 2019-04-22 NOTE — Progress Notes (Signed)
PROGRESS NOTE    [name].  DXI:[id number] DOB: 1961-08-18 DOA: 04/13/2019 PCP: Caren Macadam, MD   Brief Narrative:  The patient is a morbidly obese 58 year old Caucasian male with a past medical history significant for but not limited to OHS, OSA, chronic respiratory failure with hypoxia and hypercapnia, insulin-dependent diabetes mellitus type 2, proximal atrial fibrillation, CAD with failed CABG in 2014, CKD stage IV, history of diastolic CHF who presented to [facility] ED with shortness of breath and was intubated for respiratory protection.  He was more somnolent on arrival to the ED and more confused.  On presentation to the ED he had saturation of the low 80s with a end-tidal CO2 of 77 bilateral wheezing and diminished breath sounds.  He was started on 6 L and then they were unable to place BiPAP due to COVID restrictions and he became more hypoxic and AMS and a code sepsis was called and patient was started on broad-spectrum antibiotics.  Eventually decompensated and became unresponsive to verbal stimuli and progressively more hypoxic so was intubated; he was extubated on 04/15/2019 and transferred to the [facility] service on 04/16/2019.  Had multiple admissions for altered mental status and hypercapnia with his most recent discharge on 03/27/2019.  He reports using 3 L continuous oxygen via nasal cannula at baseline.  Currently we will try to set him up for trilogy and patient was expected to be discharged today but because of worsening leukocytosis he was not and he was pancultured.  Of note I spoke with the case manager who states that the trilogy device needs to be set up and advanced as the patient his wife and respiratory therapist need to be there for the delivery.  Will work-up patient's worsening leukocytosis and add breathing treatments as patient is breath sounds are significantly diminished still.  Assessment & Plan:   Active Problems:   Obesity hypoventilation syndrome  (HCC)   Conjunctivitis   CKD stage 3 secondary to diabetes (HCC)   IDDM (insulin dependent diabetes mellitus) (HCC)   Chronic obstructive pulmonary disease (HCC)   Acute on chronic diastolic heart failure (HCC)   Acute on chronic respiratory failure with hypoxia and hypercapnia: Likely multifactorial in the setting of untreated sleep apnea, obesity hypoventilation, COPD, narcotic use and mild acute diastolic CHF -Patient required mechanical ventilation 8/23 through 8/25.   -He was treated with IV diuretics, steroids and empiric antibiotics.   -Currently back to baseline O2 needs of 3 L continuous/home diuretic regimen still has very diminished breath sounds.   -Not been very compliant with BiPAP/CPAP here and did not wear CPAP last night.   -Per Dr. Earnest Conroy she discussed with RT and patient complied with BIPAP on Saturday night and only for few hours.  -Trilogy being set up for home use today (wife works till [employer]) per case management trilogy needs to be set up in advance as patient and wife and respiratory therapists are required for home initial set up.   -Dr. Earnest Conroy D/W Pulmonary regarding this and no additional recommendations than to hope that will tolerate Trilogy.  -If he were to present again hypercapneic respiratory failure , may end up needing tracheostomy per Dr Nelda Marseille.   -Patient and wife advised the importance of compliance with NIV to avoid recurrent hypercapnic respiratory failure related admissions and encephalopathy.  -Continue inhaler therapy as recommended by pulmonary. -Repeat CXR this AM showed "No radiographic evidence of acute cardiopulmonary disease. Status post CABG." -Continue to monitor respiratory status very carefully  and hope to discharge in the next 24 to 48 hours if medically stable and improving leukocytosis; will need to discharge and have trilogy set up in advance along with a respiratory therapist to titrate settings -Continue with flutter valve and  incentive's spirometer -Since patient's breath sounds are severely diminished today we will add Xopenex/Atrovent every 6 scheduled and also add guaifenesin 1200 g p.o. twice daily  Acute Metabolic Encephalopathy -Present on admission in relation to problem #1/hypercapnia and UTI.   -Now back to baseline  Acute on CKD stage III Hyperphosphatemia, in the setting of worsening renal function -Creatinine was 2.5 on presentation and peaked to 3.5 during hospitalization on diuretics -likely cardiorenal syndrome. Improved to 3.0 but up-trended again over the weekend (3.2) with hyponatremia ( sodium at 129) on home dose Torsemide 3m BID. Held PM diuretic dose on sunday, received 2052mIV hydration and labs somewhat improved today although still very hypokalemic.  -Resumed Torsemide 40 mg BID after replacement of potassium -Currently BUN/creatinine is trending down and went from 98/3.20 and is now 78/2.62 -If tolerates well, can discharge on prior dose of 60 mg twice daily with potassium replacement of 40 mEq twice daily.   -Continue to avoid nephrotoxic medications if possible, contrast dyes as well as hypotension -Continue monitor and trend renal function -Repeat CMP in the a.m.  Hyponatremia/Hypochloremia -In the setting of diuretic usage -Sodium is now 132 and chloride is now 84 -Continue monitor trend and repeat CMP in a.m.  Hypertension with LVH -Resume home antihypertensive therapy including Coreg/Imdur  Acute on chronic diastolic CHF -Admission chest x-ray did show cardiogenic pulmonary edema and patient diuresed with IV Lasix during ICU stay.   -Now back on oral Torsemide 40 mg p.o. twice daily.   -Continue to monitor strict I's and O's, daily weights, and fluid restrict -Patient is -26.172 L since admission; weight on admission was 315 however this is not likely accurate and repeat was 388 -Now currently down 38 pounds -Continue to monitor for signs and symptoms of volume  overload  Proteus Penneri UTI/Sepsis -Urinalysis showed clear urine appearance with small hemoglobin, trace leukocytes, rare bacteria, 6-10 RBCs per high-power field, 6-10 WBCs and 80,000 colony-forming units of Proteus Penneri -S/p 5 day course Ceftriaxone.  -Urine cultures did show multidrug resistance but sensitive to ceftriaxone. -Blood Cultures on 04/13/2019 showed NGTD at 5 Days -Patient still has some leukocytosis but afebrile.  Patient appears to have received steroids during the ICU course which might explain persistent leukocytosis but it is worsening and the highest it is been since admission so will Pan-Cx again and repeat chest x-ray, blood cultures and obtain another urinalysis  CAD -Patient apparently failed CABG.  EKG unremarkable in this admission -Home medications with carvedilol 12.5 mg p.o. twice daily, isosorbide mononitrate 60 mg p.o. daily, and rosuvastatin 20 g p.o. daily continued -Do not know if the patient is also on not aspirin as he is currently on anticoagulation with apixaban for atrial fibrillation  Atrial Fibrillation -On presentation rhythm sinus and rate controlled. CHADSVASC2 >4 -Continue with amiodarone 100 mg p.o. daily along with anticoagulation with apixaban 5 mg p.o. twice daily -Continue with Carvedilol 12.5 mg p.o. 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  Diabetes Mellitus Type 2 -Blood sugars have been ranging from 1 39-3 36 and likely worsened in the setting of steroid demargination when he received steroids over a week ago -  Currently on Lantus 50 units subcu nightly and will go up to 55 units subcu nightly -Also on resistant NovoLog/scale insulin AC and at bedtime and also on 8 units subcu 3 times daily with meals but will increase to 10 units 3 times daily with meals -Continue to monitor blood sugars carefully and adjust insulin  as necessary -We will also consult diabetes education coronary for further evaluation recommendations  ?  Hypothyroidism -Repeat levels as outpatient in 4-6 weeks  -TSH low normal at 0.8.  -Not on any home medications.  Hypokalemia, slightly improved -In the setting of diuretics. -K+ was 3.5 this morning and will continue to be repleted with potassium chloride 40 mEq twice daily -Given additional 30 mEq, doses and patient refused IV given his hand swelling and ecchymosis from yesterday's IV potassium.  -Continue to monitor and replete as necessary -Repeat CMP in the a.m.  Hand swelling/ecchymosis at Right Hand IV site -C/w warm compresses for mild phlebitis  Leukocytosis, Worsening  -? in setting of steroid demargination but patient has been off of steroids now for at least 5 days -Upon chart review he received 60 every 6 for 2 days and then 40 every 6 for the day and then 40 every 12 and steroids were stopped last dose of steroids was on 04/16/19 and I do not feel that the WBC elevation is related to steroids as he has been off of steroids for almost a week now -We will panculture the patient again and obtain blood cultures x2; initial blood cultures on 04/13/2019 were negative -We will also repeat a urinalysis -Currently has been afebrile -Repeat chest x-ray this morning showed "No radiographic evidence of acute cardiopulmonary disease and status post CABG." -Continue to monitor for signs and symptoms of infection and trend temperature curve and follow cultures -Repeat CBC in a.m.  Morbid Obesity -Estimated body mass index is 48.86 kg/m as calculated from the following:   Height as of this encounter: 5' 11"  (1.803 m).   Weight as of this encounter: 158.9 kg. -Weight Loss and Dietary Counseling given -Nutrition following and recommending 30 mL Prostat BID and MVI Daily   DVT prophylaxis: Anticoagulated with Apixaban Code Status: FULL CODE  Family Communication: Discussed with  the wife over the telephone Disposition Plan: Home when Medically Stable as PT recommends no follow up; currently was going to be discharged but leukocytosis worsening and will panculture prior to discharge.  Patient's blood sugars also need to be adjusted and improved  Consultants:   PCCM/Pulmonary (signed off)   Palliative Care Medicine   Procedures: Patient was intubated from 04/13/2019 - 04/15/2019 CT Head 7/31 > no acute CXR 8/23 > Cardiomegaly with vascular congestion and bilateral airspace opacities, likely edema/CHF. Bibasilar edema versus infection.  Antimicrobials:  Anti-infectives (From admission, onward)   Start     Dose/Rate Route Frequency Ordered Stop   04/16/19 1700  cefTRIAXone (ROCEPHIN) 2 g in sodium chloride 0.9 % 100 mL IVPB     2 g 200 mL/hr over 30 Minutes Intravenous Every 24 hours 04/16/19 1036 04/19/19 1835   04/14/19 1800  vancomycin (VANCOCIN) 1,250 mg in sodium chloride 0.9 % 250 mL IVPB  Status:  Discontinued     1,250 mg 166.7 mL/hr over 90 Minutes Intravenous Every 24 hours 04/13/19 1734 04/14/19 1102   04/14/19 0530  ceFEPIme (MAXIPIME) 2 g in sodium chloride 0.9 % 100 mL IVPB  Status:  Discontinued     2 g 200 mL/hr over 30 Minutes Intravenous Every  12 hours 04/13/19 1735 04/16/19 1036   04/13/19 1700  vancomycin (VANCOCIN) 2,500 mg in sodium chloride 0.9 % 500 mL IVPB     2,500 mg 250 mL/hr over 120 Minutes Intravenous  Once 04/13/19 1654 04/14/19 0202   04/13/19 1645  ceFEPIme (MAXIPIME) 2 g in sodium chloride 0.9 % 100 mL IVPB     2 g 200 mL/hr over 30 Minutes Intravenous  Once 04/13/19 1644 04/13/19 1824   04/13/19 1645  metroNIDAZOLE (FLAGYL) IVPB 500 mg     500 mg 100 mL/hr over 60 Minutes Intravenous  Once 04/13/19 1644 04/13/19 1824   04/13/19 1645  vancomycin (VANCOCIN) IVPB 1000 mg/200 mL premix  Status:  Discontinued     1,000 mg 200 mL/hr over 60 Minutes Intravenous  Once 04/13/19 1644 04/13/19 1654     Subjective: Patient was seen  and examined at bedside and he is sitting in the chair/recliner and he states that he was doing okay.  States that he is not able to tolerate CPAP at night because it hurts his face.  Feels a little bit better but still tired and fatigued.  Denies any chest pain, lightheadedness or dizziness.  I spoke with the case manager and home trilogy needs to be set up in advance.  Patient continues to have diminished breath sounds and will watch him panculture as is leukocytosis is worsening.  I discussed this with the patient and his wife and they are agreeable for further work-up.  Objective: Vitals:   04/21/19 2046 04/21/19 2328 04/22/19 0718 04/22/19 0729  BP:  128/67  125/75  Pulse:  89  96  Resp:  18  18  Temp:  97.9 F (36.6 C)  98 F (36.7 C)  TempSrc:  Oral  Oral  SpO2: 98% 100% 97% 100%  Weight:      Height:        Intake/Output Summary (Last 24 hours) at 04/22/2019 0756 Last data filed at 04/22/2019 1610 Gross per 24 hour  Intake 2000 ml  Output 2451 ml  Net -451 ml   Filed Weights   04/18/19 0500 04/19/19 0500 04/21/19 0645  Weight: (!) 160.9 kg (!) 159.8 kg (!) 158.9 kg   Examination: Physical Exam:  Constitutional: WN/WD morbidly obese Caucasian male NAD and appears calm but somewhat uncomfortable sitting in the chair Eyes: Lids and conjunctivae normal, sclerae anicteric  ENMT: External Ears, Nose appear normal. Grossly normal hearing. Mucous membranes are moist. Neck: Appears normal, supple, no cervical masses, normal ROM, no appreciable thyromegaly; JVD is difficult to assess given his body habitus Respiratory: Severely diminished to auscultation bilaterally with coarse breath sounds, no wheezing, rales, rhonchi or crackles. Normal respiratory effort and patient is not tachypenic. No accessory muscle use.  Wearing supplemental oxygen via nasal cannula at 3 L Cardiovascular: RRR, no murmurs / rubs / gallops. S1 and S2 auscultated.  1+ lower extremity extremity edema Abdomen:  Soft, non-tender, distended secondary to body habitus. No masses palpated. No appreciable hepatosplenomegaly. Bowel sounds positive x4.  GU: Deferred. Musculoskeletal: No clubbing / cyanosis of digits/nails. No joint deformity upper and lower extremities but has some lower extremity skin changes from venous stasis  Skin: No rashes, lesions, ulcers on limited skin evaluation but does have some hand bruising and ecchymosis and some lower extremity skin changes from venous stasis. No induration; Warm and dry.  Neurologic: CN 2-12 grossly intact with no focal deficits. Romberg sign cerebellar and reflexes not assessed.  Psychiatric: Normal judgment and insight. Alert  and oriented x 3.  Mildly anxious mood and appropriate affect.   Data Reviewed: I have personally reviewed following labs and imaging studies  CBC: Recent Labs  Lab 04/16/19 0308 04/16/19 0354 04/17/19 0357 04/18/19 0721  WBC 14.9*  --  10.7* 14.9*  NEUTROABS  --   --   --  10.9*  HGB 12.0* 13.9 11.9* 14.7  HCT 39.3 41.0 40.0 47.0  MCV 97.8  --  98.3 94.4  PLT 256  --  199 976   Basic Metabolic Panel: Recent Labs  Lab 04/16/19 0308 04/16/19 0354 04/17/19 0357 04/18/19 0721 04/20/19 0739 04/21/19 0818  NA 141 140 139 137 129* 130*  K 2.8* 2.9* 3.2* 3.3* 3.2* 2.9*  CL 87*  --  89* 86* 78* 79*  CO2 36*  --  34* 34* 29 30  GLUCOSE 273*  --  238* 207* 286* 244*  BUN 81*  --  85* 91* 98* 94*  CREATININE 3.50*  --  3.12* 3.09* 3.20* 3.10*  CALCIUM 8.9  --  9.0 9.4 8.9 8.8*  MG 2.3  --   --  2.3  --   --   PHOS 4.0  4.1  --  4.8* 3.7  --   --    GFR: Estimated Creatinine Clearance: 39.9 mL/min (A) (by C-G formula based on SCr of 3.1 mg/dL (H)). Liver Function Tests: Recent Labs  Lab 04/16/19 0308 04/17/19 0357  ALBUMIN 3.2* 3.1*   No results for input(s): LIPASE, AMYLASE in the last 168 hours. No results for input(s): AMMONIA in the last 168 hours. Coagulation Profile: No results for input(s): INR, PROTIME in  the last 168 hours. Cardiac Enzymes: No results for input(s): CKTOTAL, CKMB, CKMBINDEX, TROPONINI in the last 168 hours. BNP (last 3 results) No results for input(s): PROBNP in the last 8760 hours. HbA1C: No results for input(s): HGBA1C in the last 72 hours. CBG: Recent Labs  Lab 04/21/19 0759 04/21/19 1157 04/21/19 1641 04/21/19 2050 04/22/19 0730  GLUCAP 249* 339* 351* 262* 211*   Lipid Profile: No results for input(s): CHOL, HDL, LDLCALC, TRIG, CHOLHDL, LDLDIRECT in the last 72 hours. Thyroid Function Tests: No results for input(s): TSH, T4TOTAL, FREET4, T3FREE, THYROIDAB in the last 72 hours. Anemia Panel: No results for input(s): VITAMINB12, FOLATE, FERRITIN, TIBC, IRON, RETICCTPCT in the last 72 hours. Sepsis Labs: No results for input(s): PROCALCITON, LATICACIDVEN in the last 168 hours.  Recent Results (from the past 240 hour(s))  Urine culture     Status: Abnormal   Collection Time: 04/13/19  4:44 PM   Specimen: In/Out Cath Urine  Result Value Ref Range Status   Specimen Description IN/OUT CATH URINE  Final   Special Requests   Final    NONE Performed at [facility], [address] [place], [place] [address]    Culture 80,000 COLONIES/mL PROTEUS PENNERI (A)  Final   Report Status 04/16/2019 FINAL  Final   Organism ID, Bacteria PROTEUS PENNERI (A)  Final      Susceptibility   Proteus penneri - MIC*    AMPICILLIN >=32 RESISTANT Resistant     CEFAZOLIN >=64 RESISTANT Resistant     CEFTRIAXONE <=1 SENSITIVE Sensitive     CIPROFLOXACIN >=4 RESISTANT Resistant     GENTAMICIN <=1 SENSITIVE Sensitive     IMIPENEM 0.5 SENSITIVE Sensitive     NITROFURANTOIN 128 RESISTANT Resistant     TRIMETH/SULFA <=20 SENSITIVE Sensitive     AMPICILLIN/SULBACTAM 16 INTERMEDIATE Intermediate  PIP/TAZO <=4 SENSITIVE Sensitive     * 80,000 COLONIES/mL PROTEUS PENNERI  Blood Culture (routine x 2)     Status: None   Collection Time: 04/13/19  4:45 PM   Specimen: BLOOD RIGHT  WRIST  Result Value Ref Range Status   Specimen Description BLOOD RIGHT WRIST  Final   Special Requests   Final    BOTTLES DRAWN AEROBIC AND ANAEROBIC Blood Culture adequate volume   Culture   Final    NO GROWTH 5 DAYS Performed at [facility], [address], [place], [place] [address]    Report Status 04/18/2019 FINAL  Final  SARS CORONAVIRUS 2 Nasal Swab Aptima Multi Swab     Status: None   Collection Time: 04/13/19  5:11 PM   Specimen: Aptima Multi Swab; Nasal Swab  Result Value Ref Range Status   SARS Coronavirus 2 NEGATIVE NEGATIVE Final    Comment: (NOTE) SARS-CoV-2 target nucleic acids are NOT DETECTED. The SARS-CoV-2 RNA is generally detectable in upper and lower respiratory specimens during the acute phase of infection. Negative results do not preclude SARS-CoV-2 infection, do not rule out co-infections with other pathogens, and should not be used as the sole basis for treatment or other patient management decisions. Negative results must be combined with clinical observations, patient history, and epidemiological information. The expected result is Negative. Fact Sheet for Patients: SugarRoll.be Fact Sheet for Healthcare Providers: https://www.woods-mathews.com/ This test is not yet approved or cleared by the [place] FDA and  has been authorized for detection and/or diagnosis of SARS-CoV-2 by FDA under an Emergency Use Authorization (EUA). This EUA will remain  in effect (meaning this test can be used) for the duration of the COVID-19 declaration under Section 56 4(b)(1) of the Act, 21 U.S.C. section 360bbb-3(b)(1), unless the authorization is terminated or revoked sooner. Performed at [facility], [place] [address] [place], [place] [address]   Novel Coronavirus, NAA (hospital order; send-out to ref lab)     Status: None   Collection Time: 04/13/19  5:11 PM   Specimen: Nasopharyngeal Swab; Respiratory   Result Value Ref Range Status   SARS-CoV-2, NAA NOT DETECTED NOT DETECTED Final    Comment: (NOTE) This test was developed and its performance characteristics determined by [employer]. This test has not been FDA cleared or approved. This test has been authorized by FDA under an Emergency Use Authorization (EUA). This test is only authorized for the duration of time the declaration that circumstances exist justifying the authorization of the emergency use of in vitro diagnostic tests for detection of SARS-CoV-2 virus and/or diagnosis of COVID-19 infection under section 564(b)(1) of the Act, 21 U.S.C. 343HWY-6(H)(6), unless the authorization is terminated or revoked sooner. When diagnostic testing is negative, the possibility of a false negative result should be considered in the context of a patient's recent exposures and the presence of clinical signs and symptoms consistent with COVID-19. An individual without symptoms of COVID-19 and who is not shedding SARS-CoV-2 virus would expect to have a negative (not detected) result in this assay. Performed  At: [facility] [address] [place], [place] [id number] Rush Farmer MD DB:[id number]    [place]  Final    Comment: Performed at [facility], [place] [address], [place], [place] [address]  Blood Culture (routine x 2)     Status: None   Collection Time: 04/13/19  5:15 PM   Specimen: BLOOD LEFT HAND  Result Value Ref Range Status  Specimen Description BLOOD LEFT HAND  Final   Special Requests   Final    BOTTLES DRAWN AEROBIC AND ANAEROBIC Blood Culture results may not be optimal due to an inadequate volume of blood received in culture bottles   Culture   Final    NO GROWTH 5 DAYS Performed at [facility], [place] [address] [place], [place] [address]    Report Status 04/18/2019 FINAL  Final  MRSA PCR Screening     Status: None   Collection Time: 04/13/19 10:05 PM   Specimen:  Nasopharyngeal  Result Value Ref Range Status   MRSA by PCR NEGATIVE NEGATIVE Final    Comment:        The GeneXpert MRSA Assay (FDA approved for NASAL specimens only), is one component of a comprehensive MRSA colonization surveillance program. It is not intended to diagnose MRSA infection nor to guide or monitor treatment for MRSA infections. Performed at [facility], [place] [address], [place], [place] [address]   Respiratory Panel by PCR     Status: None   Collection Time: 04/14/19  3:16 AM   Specimen: Nasopharyngeal Swab; Respiratory  Result Value Ref Range Status   Adenovirus NOT DETECTED NOT DETECTED Final   Coronavirus 229E NOT DETECTED NOT DETECTED Final    Comment: (NOTE) The Coronavirus on the Respiratory Panel, DOES NOT test for the novel  Coronavirus (2019 nCoV)    Coronavirus HKU1 NOT DETECTED NOT DETECTED Final   Coronavirus NL63 NOT DETECTED NOT DETECTED Final   Coronavirus OC43 NOT DETECTED NOT DETECTED Final   Metapneumovirus NOT DETECTED NOT DETECTED Final   Rhinovirus / Enterovirus NOT DETECTED NOT DETECTED Final   Influenza A NOT DETECTED NOT DETECTED Final   Influenza B NOT DETECTED NOT DETECTED Final   Parainfluenza Virus 1 NOT DETECTED NOT DETECTED Final   Parainfluenza Virus 2 NOT DETECTED NOT DETECTED Final   Parainfluenza Virus 3 NOT DETECTED NOT DETECTED Final   Parainfluenza Virus 4 NOT DETECTED NOT DETECTED Final   Respiratory Syncytial Virus NOT DETECTED NOT DETECTED Final   Bordetella pertussis NOT DETECTED NOT DETECTED Final   Chlamydophila pneumoniae NOT DETECTED NOT DETECTED Final   Mycoplasma pneumoniae NOT DETECTED NOT DETECTED Final    Comment: Performed at [facility], [place]. [address], [place], [place] [address]  Culture, respiratory (non-expectorated)     Status: None   Collection Time: 04/14/19  3:16 AM   Specimen: Tracheal Aspirate; Respiratory  Result Value Ref Range Status   Specimen Description TRACHEAL ASPIRATE  Final    Special Requests NONE  Final   Gram Stain   Final    MODERATE WBC PRESENT,BOTH PMN AND MONONUCLEAR NO ORGANISMS SEEN    Culture   Final    Consistent with normal respiratory flora. Performed at [facility], [place] [address] [place], [place] [address]    Report Status 04/16/2019 FINAL  Final    RN Pressure Injury Documentation and I am in Agreement with the RN's Assessment: Pressure Injury 04/16/19 Buttocks Left;Lower Stage II -  Partial thickness loss of dermis presenting as a shallow open ulcer with a red, pink wound bed without slough. (Active)  04/16/19 1519  Location: Buttocks  Location Orientation: Left;Lower  Staging: Stage II -  Partial thickness loss of dermis presenting as a shallow open ulcer with a red, pink wound bed without slough.  Wound Description (Comments):   Present on Admission:    Radiology Studies: No results found.  Scheduled Meds: . amiodarone  100 mg Oral Daily  . apixaban  5 mg Oral BID  . arformoterol  15 mcg Nebulization BID  . artificial tears  1 application Right Eye QHS  . carvedilol  12.5 mg Oral BID WC  . chlorhexidine gluconate (MEDLINE KIT)  15 mL Mouth Rinse BID  . Chlorhexidine Gluconate Cloth  6 each Topical Daily  . docusate sodium  100 mg Oral Daily  . famotidine  20 mg Oral Daily  . feeding supplement (PRO-STAT SUGAR FREE 64)  30 mL Oral BID  . insulin aspart  0-20 Units Subcutaneous TID WC  . insulin aspart  0-5 Units Subcutaneous QHS  . insulin aspart  8 Units Subcutaneous TID WC  . insulin glargine  50 Units Subcutaneous QHS  . isosorbide mononitrate  60 mg Oral Daily  . mouth rinse  15 mL Mouth Rinse q12n4p  . multivitamin with minerals  1 tablet Oral Daily  . polyethylene glycol  17 g Oral Daily  . potassium chloride  40 mEq Oral BID  . rosuvastatin  20 mg Oral q1800  . tobramycin-dexamethasone  2 drop Right Eye Q6H  . torsemide  40 mg Oral BID   Continuous Infusions: . sodium chloride 10 mL/hr at 04/16/19 0600     LOS: 9 days   Kerney Elbe, DO [facility] PAGER is on AMION  If 7PM-7AM, please contact night-coverage [email or web address] Password [facility] 04/22/2019, 7:56 AM

## 2019-04-22 NOTE — Care Management (Addendum)
CM confirmed with [employer] that Trilogy can not be set up in the home without both pt and caregiver present .  Attending made aware that Trilogy can not be set up in the home while pt remains hospitalized.

## 2019-04-22 NOTE — Care Management Important Message (Signed)
Important Message  Patient Details  Name: [name]. MRN: [id number] Date of Birth: 06-Mar-1961   Medicare Important Message Given:  Yes     Jessilynn Taft 04/22/2019, 12:35 PM

## 2019-04-23 ENCOUNTER — Other Ambulatory Visit: Payer: Self-pay

## 2019-04-23 ENCOUNTER — Inpatient Hospital Stay (HOSPITAL_COMMUNITY): Payer: HMO

## 2019-04-23 ENCOUNTER — Ambulatory Visit: Payer: Self-pay

## 2019-04-23 DIAGNOSIS — N184 Chronic kidney disease, stage 4 (severe): Secondary | ICD-10-CM

## 2019-04-23 DIAGNOSIS — N179 Acute kidney failure, unspecified: Secondary | ICD-10-CM

## 2019-04-23 LAB — MAGNESIUM: Magnesium: 1.9 mg/dL (ref 1.7–2.4)

## 2019-04-23 LAB — COMPREHENSIVE METABOLIC PANEL
ALT: 38 U/L (ref 0–44)
AST: 28 U/L (ref 15–41)
Albumin: 3.4 g/dL — ABNORMAL LOW (ref 3.5–5.0)
Alkaline Phosphatase: 248 U/L — ABNORMAL HIGH (ref 38–126)
Anion gap: 16 — ABNORMAL HIGH (ref 5–15)
BUN: 67 mg/dL — ABNORMAL HIGH (ref 6–20)
CO2: 31 mmol/L (ref 22–32)
Calcium: 9.1 mg/dL (ref 8.9–10.3)
Chloride: 88 mmol/L — ABNORMAL LOW (ref 98–111)
Creatinine, Ser: 2.64 mg/dL — ABNORMAL HIGH (ref 0.61–1.24)
GFR calc Af Amer: 30 mL/min — ABNORMAL LOW (ref >60)
GFR calc non Af Amer: 26 mL/min — ABNORMAL LOW (ref >60)
Glucose, Bld: 159 mg/dL — ABNORMAL HIGH (ref 70–99)
Potassium: 3.2 mmol/L — ABNORMAL LOW (ref 3.5–5.1)
Sodium: 135 mmol/L (ref 135–145)
Total Bilirubin: 0.6 mg/dL (ref 0.3–1.2)
Total Protein: 7.4 g/dL (ref 6.5–8.1)

## 2019-04-23 LAB — CBC WITH DIFFERENTIAL/PLATELET
Abs Immature Granulocytes: 0.1 10*3/uL — ABNORMAL HIGH (ref 0.00–0.07)
Basophils Absolute: 0 10*3/uL (ref 0.0–0.1)
Basophils Relative: 0 %
Eosinophils Absolute: 1.2 10*3/uL — ABNORMAL HIGH (ref 0.0–0.5)
Eosinophils Relative: 8 %
HCT: 44.2 % (ref 39.0–52.0)
Hemoglobin: 13.9 g/dL (ref 13.0–17.0)
Immature Granulocytes: 1 %
Lymphocytes Relative: 17 %
Lymphs Abs: 2.5 10*3/uL (ref 0.7–4.0)
MCH: 29.4 pg (ref 26.0–34.0)
MCHC: 31.4 g/dL (ref 30.0–36.0)
MCV: 93.4 fL (ref 80.0–100.0)
Monocytes Absolute: 1.4 10*3/uL — ABNORMAL HIGH (ref 0.1–1.0)
Monocytes Relative: 10 %
Neutro Abs: 9.2 10*3/uL — ABNORMAL HIGH (ref 1.7–7.7)
Neutrophils Relative %: 64 %
Platelets: 197 10*3/uL (ref 150–400)
RBC: 4.73 MIL/uL (ref 4.22–5.81)
RDW: 13.4 % (ref 11.5–15.5)
WBC: 14.5 10*3/uL — ABNORMAL HIGH (ref 4.0–10.5)
nRBC: 0 % (ref 0.0–0.2)

## 2019-04-23 LAB — GLUCOSE, CAPILLARY
Glucose-Capillary: 210 mg/dL — ABNORMAL HIGH (ref 70–99)
Glucose-Capillary: 294 mg/dL — ABNORMAL HIGH (ref 70–99)
Glucose-Capillary: 166 mg/dL — ABNORMAL HIGH (ref 70–99)
Glucose-Capillary: 143 mg/dL — ABNORMAL HIGH (ref 70–99)

## 2019-04-23 LAB — PHOSPHORUS: Phosphorus: 4.4 mg/dL (ref 2.5–4.6)

## 2019-04-23 MED ORDER — LEVALBUTEROL HCL 0.63 MG/3ML IN NEBU
0.6300 mg | INHALATION_SOLUTION | Freq: Three times a day (TID) | RESPIRATORY_TRACT | Status: DC
  Administered 2019-04-23: 0.63 mg via RESPIRATORY_TRACT
  Filled 2019-04-23: qty 3

## 2019-04-23 MED ORDER — LEVALBUTEROL HCL 0.63 MG/3ML IN NEBU: 0.6300 mg | INHALATION_SOLUTION | Freq: Four times a day (QID) | RESPIRATORY_TRACT | Status: DC | PRN

## 2019-04-23 MED ORDER — INSULIN ASPART 100 UNIT/ML ~~LOC~~ SOLN
12.0000 [IU] | Freq: Three times a day (TID) | SUBCUTANEOUS | Status: DC
  Administered 2019-04-23 – 2019-04-24 (×3): 12 [IU] via SUBCUTANEOUS

## 2019-04-23 MED ORDER — IPRATROPIUM BROMIDE 0.02 % IN SOLN
0.5000 mg | Freq: Two times a day (BID) | RESPIRATORY_TRACT | Status: DC
  Administered 2019-04-23 – 2019-04-24 (×2): 0.5 mg via RESPIRATORY_TRACT
  Filled 2019-04-23 (×2): qty 2.5

## 2019-04-23 MED ORDER — IPRATROPIUM BROMIDE 0.02 % IN SOLN
0.5000 mg | Freq: Three times a day (TID) | RESPIRATORY_TRACT | Status: DC
  Administered 2019-04-23: 0.5 mg via RESPIRATORY_TRACT
  Filled 2019-04-23: qty 2.5

## 2019-04-23 MED ORDER — LEVALBUTEROL HCL 0.63 MG/3ML IN NEBU
0.6300 mg | INHALATION_SOLUTION | Freq: Two times a day (BID) | RESPIRATORY_TRACT | Status: DC
  Administered 2019-04-23 – 2019-04-24 (×2): 0.63 mg via RESPIRATORY_TRACT
  Filled 2019-04-23 (×2): qty 3

## 2019-04-23 NOTE — Progress Notes (Signed)
Physical Therapy Treatment Patient Details Name: [name]. MRN: [id number] DOB: January 25, 1961 Today's Date: 04/23/2019    History of Present Illness Pt is a 58 year old man admitted 04/13/19 with SOB and hypoxia requiring intubation. Extubated 8/25. PMH: morbid obesity, OSA, chronic respiratory failure with hypoxia and hypercapnia, DM, afib, CAD, failed CABG, CKD 4, CHF, CVA, acoustic neuroma. This is pt's 3rd admission in 6 months.    PT Comments    Patient progressing with ambulation distance and able to go without recliner following, though he felt he might need it, encouraged to use seat on rollator when needed.  Some difficulty with balance turning around after rising from sitting, but used desk at nursing station for UE support.  Feel patient likely close to his baseline.  Likely will not need follow up PT at home.    Follow Up Recommendations  No PT follow up;Supervision - Intermittent     Equipment Recommendations  None recommended by PT    Recommendations for Other Services       Precautions / Restrictions Precautions Precautions: Fall Precaution Comments: blind in R eye Restrictions Weight Bearing Restrictions: No    Mobility  Bed Mobility               General bed mobility comments: Pt up in the chair  Transfers Overall transfer level: Needs assistance Equipment used: 4-wheeled walker Transfers: Sit to/from Stand Sit to Stand: Supervision         General transfer comment: increased time, heavy UE use  Ambulation/Gait Ambulation/Gait assistance: Min guard;Supervision Gait Distance (Feet): 100 Feet(&50') Assistive device: 4-wheeled walker(bari) Gait Pattern/deviations: Step-through pattern;Trunk flexed;Wide base of support;Decreased stride length     General Gait Details: assist for safety, used seat on walker to rest in hallway due to hip stiffened up, HR max noted was 139, SpO2 100% on 3L O2   Stairs             Wheelchair  Mobility    Modified Rankin (Stroke Patients Only)       Balance Overall balance assessment: Needs assistance Sitting-balance support: No upper extremity supported;Feet supported Sitting balance-Leahy Scale: Fair Sitting balance - Comments: cannot get his own socks on per report   Standing balance support: Bilateral upper extremity supported Standing balance-Leahy Scale: Poor Standing balance comment: heavy UE support needed, some difficulty standing from rollator and turning to hold handles to walk                            Cognition Arousal/Alertness: Awake/alert Behavior During Therapy: WFL for tasks assessed/performed Overall Cognitive Status: History of cognitive impairments - at baseline Area of Impairment: Safety/judgement                     Memory: Decreased short-term memory Following Commands: Follows multi-step commands inconsistently Safety/Judgement: Decreased awareness of safety     General Comments: Pt advised not to trim him mustache and ended up creating a small bleed on R side of lip. Pt ended up trimmig his mustache as OT was setting pt up for shaving task. Washcloth was held to area and aftr afew mins it stopped bleeding- luckily a very small knick.      Exercises      General Comments General comments (skin integrity, edema, etc.): wife present throughout, explained issues related to retaining CO2 and need for pursed lip exhalation and utilized flutter valve  Pertinent Vitals/Pain Pain Assessment: No/denies pain Faces Pain Scale: Hurts a little bit Pain Location: R hip Pain Descriptors / Indicators: Tightness Pain Intervention(s): Monitored during session;Repositioned    Home Living                      Prior Function            PT Goals (current goals can now be found in the care plan section) Acute Rehab PT Goals Patient Stated Goal: to have a BM on the toilet Progress towards PT goals: Progressing toward  goals    Frequency    Min 3X/week      PT Plan Current plan remains appropriate    Co-evaluation              AM-PAC PT "6 Clicks" Mobility   Outcome Measure  Help needed turning from your back to your side while in a flat bed without using bedrails?: A Little Help needed moving from lying on your back to sitting on the side of a flat bed without using bedrails?: A Little Help needed moving to and from a bed to a chair (including a wheelchair)?: None Help needed standing up from a chair using your arms (e.g., wheelchair or bedside chair)?: None Help needed to walk in hospital room?: None Help needed climbing 3-5 steps with a railing? : A Lot 6 Click Score: 20    End of Session Equipment Utilized During Treatment: Oxygen Activity Tolerance: Patient tolerated treatment well Patient left: in chair;with call bell/phone within reach;with chair alarm set;with family/visitor present   PT Visit Diagnosis: Muscle weakness (generalized) (M62.81);Difficulty in walking, not elsewhere classified (R26.2)     Time: 1941-7408 PT Time Calculation (min) (ACUTE ONLY): 23 min  Charges:  $Gait Training: 23-37 mins                     Magda Kiel, [place] Acute Rehabilitation Services [phone] 04/23/2019    Reginia Naas 04/23/2019, 5:26 PM

## 2019-04-23 NOTE — Procedures (Signed)
Attempted to place Bipap but patient wanted to eat.  He already declined to wear tonight, states it was too tight previous nights.

## 2019-04-23 NOTE — Progress Notes (Signed)
PROGRESS NOTE  [name]. OEH:[id number] DOB: 1961-05-21 DOA: 04/13/2019 PCP: Caren Macadam, MD  Brief History   58 year old man PMH including OHS, OSA, chronic respiratory failure with hypoxia and hypercapnia, presented with severe shortness of breath and hypoxia, was intubated in the emergency department.  Admitted for acute on chronic hypoxic/hypercapnic respiratory failure with associated acute encephalopathy.  He was admitted by critical care, treated with empiric antibiotics, diuresis, with gradual clinical improvement and was subsequently extubated.  Given recurrent admissions for respiratory failure, he was felt to be a candidate for Trilogy and plans were made also for outpatient sleep study.  A & P  Acute on chronic hypoxic/hypercapnic respiratory failure with associated acute encephalopathy on admission. --Per critical care differential included possible pneumonia versus acute COPD exacerbation versus volume overload --Acute issues resolved  Chronic hypoxic/hypercapnic respiratory failure on 3 L, COPD, obesity hypoventilation syndrome, OSA --Continue bronchodilators: LABA, ICS, LAMA --Completed steroid taper --Titrate for SPO2 88-90% --Plan for outpatient noninvasive ventilator for nighttime use as well as as needed during daytime.  Difficulty tolerating BiPAP during hospitalization.  Per Dr. Lenoria Chime note 8/31, pulmonology had no further recommendations.  If the patient develops hypercapnic respiratory failure again may end up needing a tracheostomy. --Outpatient sleep study will be arranged by cardiology  Diabetes mellitus type 2 on insulin --Blood sugars poorly controlled.  Continue Lantus.  Increase meal coverage to 12 units.  Continue sliding scale insulin.  Atrial fibrillation on Eliquis and amiodarone at home. --Continue apixaban, amiodarone, carvedilol  AKI superimposed on CKD stage IV with associated anion gap metabolic acidosis, anion gap appears  to be near normal now. --Slowly improving, BUN and creatinine trending down.  Creatinine appears to be near baseline  Acute on chronic diastolic CHF --Treated with aggressive diuresis, down 26 L since admission, down 38 pounds. --Continue Imdur, carvedilol --Continue torsemide  Leukocytosis --This appears to be chronic.  Review of records demonstrates intermittent leukocytosis, seen 03/24/2019, 02/2019, 01/2017, 04/2018, 02/2016, 10/2012, 09/2012, 11/2007, 03/2007 --There is no evidence of infection, would not chase this number further --Repeat cultures are negative thus far --Chest x-ray independently reviewed today 9/2 no acute disease. --If no issues overnight, would anticipate discharge tomorrow if blood sugar stable  Secondary hypothyroidism --Follow-up with endocrinology as an outpatient  CAD --Continue carvedilol, isosorbide mononitrate, Crestor and  Morbid obesity --Follow-up as an outpatient.  Continue management as per dietitian including multivitamin.  PMH includes ischemic cardiomyopathy, CVA  Right eye conjunctivitis treated by palliative medicine with TobraDex started 8/9 > 9/4 --Appears to be resolved at this point.  . Overall improved.  Adjust insulin coverage to better control blood sugars.  Monitor for fever or signs of infection.  If continues to improve and these issues are stable, would anticipate discharge 9/30.  Resolved Hospital Problem list    Acute hypoxic/hypercapnic respiratory failure  Acute encephalopathy  Possible UTI treated with 5 days ceftriaxone  Acute diastolic CHF   DVT prophylaxis: apixaban Code Status: Full Family Communication:  Disposition Plan: Home with home health OT   Murray Hodgkins, MD  [facility] Direct contact: see [email or web address] (further directions at bottom of note if needed) 7PM-7AM contact night coverage as at bottom of note 04/23/2019, 12:52 PM  LOS: 10 days   Significant Hospital Events   8/1 > Admit with AMS, SOB >  Believed secondary to volume overload and AECOPD 8/6 > Discharged, noted drug resistant UTI > Providencia rettgeri  8/23 >Presents to ED, Confusion SOB, Endotracheally intubated  Consults:  . PCCM 8/23  . Palliative medicine   Procedures:  04/13/2019 ETT >> 8/25  Significant Diagnostic Tests:  CT Head 7/31 > no acute CXR 8/23 > Cardiomegaly with vascular congestion and bilateral airspace opacities, likely edema/CHF. Bibasilar edema versus infection.   Micro Data:  04/13/2019 >SARSCOV2 aptima negative  MRSA surveillance > negative 8/23 Urine > 80,000 Colonies of Proteus Penneri >>  8/23 Blood>> 8/26 NGTD 8/24: tracheal aspirate >> Usual Oral Flora   Antimicrobials:  Vancomycin  8/23 >>8/24 Cefepime      8/23 >> Metronidazole 8/23  Interval History/Subjective  Feels okay, breathing okay.  No nausea or vomiting.  Right eye irritation has improved.  Reports chronic color change bilateral lower extremities improved with elevation.  Objective   Vitals:  Vitals:   04/23/19 0739 04/23/19 0825  BP: 115/78   Pulse: 82 83  Resp: 18 18  Temp: 97.7 F (36.5 C)   SpO2: 99% 99%    Exam:  Constitutional:  . Appears calm and comfortable eating lunch Eyes:  . Right exotropia noted.  Right eyes sclera appears unremarkable.  No injection.  No exudate noted. ENMT:  . grossly normal hearing  . Lips appear normal . Right facial droop noted Respiratory:  . Decreased air movement bilaterally . Respiratory effort normal.  Cardiovascular:  . RRR, no m/r/g . 1+ bilateral LE extremity edema   . Dependent rubor noted bilaterally . Dorsalis pedis pulses 2+ bilaterally Psychiatric:  . Mental status o Mood, affect appropriate  I have personally reviewed the following:   Today's Data  . CBG poorly controlled 200-300s . Potassium 3.2, sodium normal at 135, BUN 2967, creatinine trending down 2.64.  Anion gap 16. . Magnesium and phosphorus within normal limits.  LFTs unremarkable.  . WBC 14.5.   Scheduled Meds: . amiodarone  100 mg Oral Daily  . apixaban  5 mg Oral BID  . arformoterol  15 mcg Nebulization BID  . artificial tears  1 application Right Eye QHS  . carvedilol  12.5 mg Oral BID WC  . chlorhexidine gluconate (MEDLINE KIT)  15 mL Mouth Rinse BID  . Chlorhexidine Gluconate Cloth  6 each Topical Daily  . docusate sodium  100 mg Oral Daily  . famotidine  20 mg Oral Daily  . feeding supplement (PRO-STAT SUGAR FREE 64)  30 mL Oral BID  . guaiFENesin  1,200 mg Oral BID  . insulin aspart  0-20 Units Subcutaneous TID WC  . insulin aspart  0-5 Units Subcutaneous QHS  . insulin aspart  12 Units Subcutaneous TID WC  . insulin glargine  55 Units Subcutaneous QHS  . ipratropium  0.5 mg Nebulization BID  . isosorbide mononitrate  60 mg Oral Daily  . levalbuterol  0.63 mg Nebulization BID  . mouth rinse  15 mL Mouth Rinse q12n4p  . multivitamin with minerals  1 tablet Oral Daily  . polyethylene glycol  17 g Oral Daily  . potassium chloride  40 mEq Oral BID  . rosuvastatin  20 mg Oral q1800  . tobramycin-dexamethasone  2 drop Right Eye Q6H  . torsemide  40 mg Oral BID   Continuous Infusions: . sodium chloride 10 mL/hr at 04/16/19 0600    Principal Problem:   Acute on chronic respiratory failure with hypoxia and hypercapnia (HCC) Active Problems:   Obesity, Class III, BMI 40-49.9 (morbid obesity) ([place])   Conjunctivitis   AKI (acute kidney injury) ([place])   CKD stage 3 secondary to diabetes ([place])  IDDM (insulin dependent diabetes mellitus) (HCC)   Chronic obstructive pulmonary disease (HCC)   Acute on chronic diastolic heart failure (HCC)   CKD (chronic kidney disease), stage IV ([place])   LOS: 10 days   How to contact the [facility] Attending or Consulting provider 7A - 7P or covering provider during after hours [place], for this patient?  1. Check the care team in [facility] and look for a) attending/consulting [facility] provider listed and b) the [facility] team listed 2. Log into  [email or web address] and use [facility] universal password to access. If you do not have the password, please contact the hospital operator. 3. Locate the [facility] provider you are looking for under [facility] and page to a number that you can be directly reached. 4. If you still have difficulty reaching the provider, please page the [facility] (Director on Call) for the Hospitalists listed on amion for assistance.

## 2019-04-23 NOTE — Progress Notes (Signed)
Pt accidentally pulled condom cath off, and requested a new one. Pt has full ROM in BUE and full strength but states that because he had a stroke he is uncoordinated and cannot use a washcloth to do peri care. Pt began using verbally offensive and aggressive language describing staff as "jackasses" and "assholes". 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

## 2019-04-23 NOTE — Progress Notes (Signed)
Occupational Therapy Treatment Patient Details Name: [name]. MRN: [id number] DOB: 01/09/1961 Today's Date: 04/23/2019    History of present illness Pt is a 58 year old man admitted 04/13/19 with SOB and hypoxia requiring intubation. Extubated 8/25. PMH: morbid obesity, OSA, chronic respiratory failure with hypoxia and hypercapnia, DM, afib, CAD, failed CABG, CKD 4, CHF, CVA, acoustic neuroma. This is pt's 3rd admission in 6 months.   OT comments  Pt progress well. Goals met acutely. Pt aware of energy conservation and reports to have a supportive spouse who assists with most LB ADL.  Pt sat at sink for shaving and trimming his mustache with set-upA to minA for R side as pt is blind in R eye. Pt reporting to feel as though "I'm normally like this." Pt transferring and mobilizing in room ~10'x2 with rollator and supervisionA. Advise family to be present 24/7 initially as pt has difficulty problem solving at times and does not always make good choices. Pt does not require continued OT skilled services. OT POC updated. OT signing off.  O2>90% on 3L O2   Follow Up Recommendations  Home health OT;Supervision/Assistance - 24 hour    Equipment Recommendations  None recommended by OT    Recommendations for Other Services      Precautions / Restrictions Precautions Precautions: Fall Precaution Comments: blind in R eye Restrictions Weight Bearing Restrictions: No       Mobility Bed Mobility               General bed mobility comments: Pt up in the chair  Transfers Overall transfer level: Needs assistance Equipment used: 4-wheeled walker Transfers: Sit to/from Stand Sit to Stand: Supervision         General transfer comment: Supervision for lines.     Balance Overall balance assessment: Needs assistance Sitting-balance support: No upper extremity supported;Feet supported Sitting balance-Leahy Scale: Fair     Standing balance support: Bilateral upper  extremity supported Standing balance-Leahy Scale: Poor Standing balance comment: UE support                           ADL either performed or assessed with clinical judgement   ADL Overall ADL's : Needs assistance/impaired     Grooming: Set up;Minimal assistance;Sitting Grooming Details (indicate cue type and reason): shaving and trimming his mustache with set-upA to minA for R side as pt is blind in R eye.             Lower Body Dressing: Moderate assistance;Sitting/lateral leans;Sit to/from stand Lower Body Dressing Details (indicate cue type and reason): "my wife helps me." Toilet Transfer: Sales executive;Ambulation;Grab bars           Functional mobility during ADLs: Supervision/safety;Rolling walker;Cueing for safety General ADL Comments: Pt set-upA to minA for grooming in sitting today.     Vision       Perception     Praxis      Cognition Arousal/Alertness: Awake/alert Behavior During Therapy: Flat affect Overall Cognitive Status: Impaired/Different from baseline Area of Impairment: Safety/judgement                         Safety/Judgement: Decreased awareness of safety     General Comments: Pt advised not to trim him mustache and ended up creating a small bleed on R side of lip. Pt ended up trimmig his mustache as OT was setting pt up for shaving task. Washcloth was  held to area and aftr afew mins it stopped bleeding- luckily a very small knick.        Exercises     Shoulder Instructions       General Comments      Pertinent Vitals/ Pain       Pain Assessment: No/denies pain  Home Living                                          Prior Functioning/Environment              Frequency  Min 2X/week        Progress Toward Goals  OT Goals(current goals can now be found in the care plan section)  Progress towards OT goals: Goals met and updated - see care plan;Goals met/education  completed, patient discharged from OT  Acute Rehab OT Goals Patient Stated Goal: to have a BM on the toilet OT Goal Formulation: With patient Time For Goal Achievement: 04/30/19 Potential to Achieve Goals: Good ADL Goals Pt Will Perform Grooming: with supervision;standing Pt Will Perform Upper Body Dressing: with set-up;sitting Pt Will Perform Lower Body Dressing: with supervision;sit to/from stand Pt Will Transfer to Toilet: with supervision  Plan Discharge plan remains appropriate    Co-evaluation                 AM-PAC OT "6 Clicks" Daily Activity     Outcome Measure   Help from another person eating meals?: None Help from another person taking care of personal grooming?: A Little Help from another person toileting, which includes using toliet, bedpan, or urinal?: Total Help from another person bathing (including washing, rinsing, drying)?: A Lot Help from another person to put on and taking off regular upper body clothing?: A Little Help from another person to put on and taking off regular lower body clothing?: A Lot 6 Click Score: 15    End of Session Equipment Utilized During Treatment: Rolling walker;Oxygen;Gait belt  OT Visit Diagnosis: Unsteadiness on feet (R26.81);Other abnormalities of gait and mobility (R26.89);Other symptoms and signs involving cognitive function   Activity Tolerance Patient tolerated treatment well   Patient Left in chair;with call bell/phone within reach;with chair alarm set   Nurse Communication Mobility status        Time: 4888-9169 OT Time Calculation (min): 38 min  Charges: OT General Charges $OT Visit: 1 Visit OT Treatments $Self Care/Home Management : 38-52 mins  Ebony Hail Harold Hedge) Marsa Aris OTR/L Acute Rehabilitation Services Pager: [phone] Office: [phone]    Audie Pinto 04/23/2019, 3:29 PM

## 2019-04-23 NOTE — Consult Note (Addendum)
   [facility] CM Inpatient Consult   04/23/2019  Trystin Terhune. 1961-08-16 [id number]  Update/chart review/care coordination  Called to patient at the bedside phone. HIPAA verified. Explained this caller's role in[facility] Care Management with HTA CSNP program follow up. Patient states he thinks he is going home tomorrow.  His main concern is that the reason he is not tolerating the machine at night is because "it is hurting the right side of my face. 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 I have tried to use it as long as I can. Also, that machine is so loud that it keeps me awake at night. I just used the thing in my nose last night and I was able to sleep most of the night.   What is the difference between that machine I am suppose to get and the CPAP machine?" Encouraged him to speak with the Respiratory Therapist regarding the recommendations for the machine {[facility]].  Spoke at length with patient regarding post  hospital follow up needs. Patient states, "I would like to have a nurse to help me with my medications, filling up my pill box and all but, the last time I had home health I was charged over $200 for someone to come out for 30 minutes." Explained more about HTA CSNP team follow up.   Inquired about the Palliative Care consult with Dr. Hilma Favors.  He states, "I heard what that doctor was saying but, I don't want no body messing up my pain medicines and all. I just can't hardly stand the pain at times and I don't want things to get confusing with another person involved." Tried to explain the additional support of Palliative Care that could provide however, he states, "I just don't see it right now." Encouraged him that the services is available and he could always speak to his Chronic Care Coordinator about it. He said he would.  Thanked him for his time and encouraged him to work with his coordinators to assist him in his healthcare goals.   Plan: Follow up for disposition and needs with inpatient [facility] team and HTA CSNP Coordinator.  For questions, please contact:  Natividad Brood, RN BSN [facility] Liaison  [phone] business mobile phone Toll free office [phone]  Fax number: [phone] [email or web address]

## 2019-04-23 NOTE — Progress Notes (Signed)
Pt making frequent requests for drinks and food from anyone who walks in pt's room. Pt instructed on importance of his fluid restriction and carb modification orders. Pt verbalizes understanding.

## 2019-04-23 NOTE — Progress Notes (Signed)
Pt attempting to persuade staff to bring in norco early. Is attempting to create schedule for staff to administer snacks, drinks, and medications.

## 2019-04-23 NOTE — Progress Notes (Signed)
Inpatient Diabetes Program Recommendations  AACE/ADA: New Consensus Statement on Inpatient Glycemic Control (2015)  Target Ranges:  Prepandial:   less than 140 mg/dL      Peak postprandial:   less than 180 mg/dL (1-2 hours)      Critically ill patients:  140 - 180 mg/dL   Lab Results  Component Value Date   GLUCAP 210 (H) 04/23/2019   HGBA1C 7.8 (H) 04/14/2019    Review of Glycemic Control  Diabetes history: DM2 Outpatient Diabetes medications: Lantus 45 units Current orders for Inpatient glycemic control: Lantus 55 QHS Novolog 0-20 units tidwc and hs + 10 units tidwc  Per RN, pt making frequent requests for food and drinks. RN instructed on importance of his fluid restriction and carb modification orders. Pt verbalized understanding.  Pt will definitely need meal coverage insulin at home. Agree with insulin titration.  Would benefit from pt/wife attending OP Diabetes Education consult.   Inpatient Diabetes Program Recommendations:     Increase Novolog to 12 units tidwc.  Follow closely.  Thank you. Lorenda Peck, RD, LDN, CDE Inpatient Diabetes Coordinator [phone]

## 2019-04-23 NOTE — Patient Outreach (Signed)
  [facility]) Care Management Chronic Special Needs Program   04/23/2019  Name: [name]., DOB: [birth year]  MRN: [id number]  The client was discussed in today's interdisciplinary care team meeting.  The following issues were discussed:  Client's needs, Changes in health status, Care Plan, Coordination of care, Care transitions and Issues/barriers to care  Participants present:   Bary Castilla, RN, BSN, MS, CCM Thea Silversmith, MSN, BSN, RN, CCM Kelli Churn, [place] Team: Babs Bertin, RN, BSN; Teresa Pelton, RN, BSN, HSD Gilda Crease, PharmD, RPh  Marco Collie, MD Coralie Carpen, MD  Recommendations:  [facility] to follow up and engage at home. [facility] to assess for the need of a SNF upon initial home visit.  Plan: Care Coordination with [facility]; continue to follow as clients chronic special needs care coordinator.   Thea Silversmith, RN, MSN, [place] [phone]

## 2019-04-24 ENCOUNTER — Other Ambulatory Visit: Payer: Self-pay

## 2019-04-24 ENCOUNTER — Telehealth: Payer: Self-pay | Admitting: *Deleted

## 2019-04-24 LAB — GLUCOSE, CAPILLARY
Glucose-Capillary: 147 mg/dL — ABNORMAL HIGH (ref 70–99)
Glucose-Capillary: 287 mg/dL — ABNORMAL HIGH (ref 70–99)

## 2019-04-24 MED ORDER — IPRATROPIUM BROMIDE 0.02 % IN SOLN: 0.5000 mg | Freq: Two times a day (BID) | RESPIRATORY_TRACT | 0 refills | Status: AC

## 2019-04-24 MED ORDER — ADULT MULTIVITAMIN W/MINERALS CH: 1.0000 | ORAL_TABLET | Freq: Every day | ORAL | Status: AC

## 2019-04-24 MED ORDER — PRO-STAT SUGAR FREE PO LIQD: 30.0000 mL | Freq: Two times a day (BID) | ORAL | 0 refills | Status: DC

## 2019-04-24 MED ORDER — ARFORMOTEROL TARTRATE 15 MCG/2ML IN NEBU: 15.0000 ug | INHALATION_SOLUTION | Freq: Two times a day (BID) | RESPIRATORY_TRACT | 0 refills | Status: DC

## 2019-04-24 MED ORDER — LEVALBUTEROL HCL 0.63 MG/3ML IN NEBU: 0.6300 mg | INHALATION_SOLUTION | Freq: Two times a day (BID) | RESPIRATORY_TRACT | 0 refills | Status: AC

## 2019-04-24 MED ORDER — TOBRAMYCIN-DEXAMETHASONE 0.3-0.1 % OP SUSP: 2.0000 [drp] | Freq: Four times a day (QID) | OPHTHALMIC | 0 refills | Status: AC

## 2019-04-24 MED ORDER — LEVALBUTEROL HCL 0.63 MG/3ML IN NEBU: 0.6300 mg | INHALATION_SOLUTION | Freq: Four times a day (QID) | RESPIRATORY_TRACT | 0 refills | Status: DC | PRN

## 2019-04-24 MED ORDER — BUDESONIDE-FORMOTEROL FUMARATE 160-4.5 MCG/ACT IN AERO: 2.0000 | INHALATION_SPRAY | Freq: Two times a day (BID) | RESPIRATORY_TRACT | 0 refills | Status: DC

## 2019-04-24 NOTE — Telephone Encounter (Signed)
Attempted to reach patient on both numbers on file, unable to leave message due to voicemail not set up. (see prev notes)

## 2019-04-24 NOTE — Telephone Encounter (Signed)
I called Levada Dy, nurse secretary at [phone] and informed her I have been trying to contact the pt as Dr Ethlyn Gallery stated the pt can have a phone visit as she is aware he has a few appts that are needed with specialists etc.  Appt scheduled for 9/4 at 1:30pm.

## 2019-04-24 NOTE — Discharge Summary (Addendum)
Physician Discharge Summary  [name]. 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  Difficulty tolerating BiPAP during hospitalization.  Per Dr. Lenoria Chime note 8/31, pulmonology had no further recommendations.  If the patient develops hypercapnic respiratory failure again may end up needing a tracheostomy. --Outpatient sleep study will be arranged by cardiology  Leukocytosis --Appears to be chronic, consider outpatient evaluation if clinically indicated after acute issues resolved  Secondary hypothyroidism --Consider follow-up with endocrinology as an outpatient   Follow-up Information    [facility] Follow up.   Specialty: Home Health Services Why: Home Health Contact information: [address] [place] [address] [phone]        Caren Macadam, MD. Schedule an appointment as soon as possible for a visit in 1 week(s).   Specialty: Family Medicine Contact information: [place] [address] [phone]        [name], MD .   Specialty: Cardiology Contact information: [place] [address] [phone]            Discharge Diagnoses: Principal diagnosis is #1 1. Acute on chronic hypoxic/hypercapnic respiratory failure with associated acute encephalopathy  2. 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 Diabetes mellitus type 2 on insulin 4. Atrial fibrillation on Eliquis and amiodarone at home. 5. AKI superimposed on CKD stage IV with associated anion  gap metabolic acidosis, anion gap appears to be near normal now. 6. Acute on chronic diastolic CHF 7. Leukocytosis 8. Secondary hypothyroidism 9. CAD 10. Morbid obesity 11. Right eye conjunctivitis  Discharge Condition: improved Disposition: home  Diet recommendation: heart healthy, diabetic diet  Filed Weights   04/18/19 0500 04/19/19 0500 04/21/19 0645  Weight: (!) 160.9 kg (!) 159.8 kg (!) 158.9 kg    History of present illness:  58 year old man PMH including OHS, OSA, chronic respiratory failure with hypoxia and hypercapnia, presented with severe shortness of breath and hypoxia, was intubated in the emergency department.  Admitted for acute on chronic hypoxic/hypercapnic respiratory failure with associated acute encephalopathy.    Hospital Course:  He was admitted by critical care, treated with empiric antibiotics, diuresis, with gradual clinical improvement and was subsequently extubated.  Given recurrent admissions for respiratory failure, he was felt to be a candidate for Trilogy and plans were made also for outpatient sleep study.  Noncompliance was noted during hospitalization.  AKI resolved.  Acute diastolic CHF resolved.  See individual issues below.  Acute on chronic hypoxic/hypercapnic respiratory failure with associated acute encephalopathy on admission.  Differential included possible pneumonia versus acute COPD exacerbation versus volume overload. --Acute issues resolved.  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  Difficulty tolerating BiPAP during hospitalization.  Per Dr. Lenoria Chime note 8/31, pulmonology had no further recommendations.  If the patient develops hypercapnic respiratory failure again may end up needing  a tracheostomy. --Outpatient sleep study will be arranged by cardiology  Diabetes mellitus type 2 on insulin --Blood sugars better controlled  with fasting blood sugar 147 this morning. --Continue Lantus on discharge  Atrial fibrillation on Eliquis and amiodarone at home. --Remained stable.  Continue apixaban, amiodarone, carvedilol  AKI superimposed on CKD stage IV with associated anion gap metabolic acidosis, anion gap appears to be near normal now. --Slowly improved, BUN and creatinine trending down.  Creatinine appears to be very near baseline --Follow-up as an outpatient  Acute on chronic diastolic CHF --Treated with aggressive diuresis, down 27 L since admission, down 38 pounds. --Continue Imdur, carvedilol, torsemide  Leukocytosis --This appears to be chronic.  Review of records demonstrates intermittent leukocytosis, seen 03/24/2019, 02/2019, 01/2017, 04/2018, 02/2016, 10/2012, 09/2012, 11/2007, 03/2007 --There is no evidence of infection, would not chase this number further --Repeat cultures are negative thus far --Chest x-ray independently reviewed today 9/2 no acute disease.  Secondary hypothyroidism --Follow-up with endocrinology as an outpatient  CAD --Stable.  Continue carvedilol, isosorbide mononitrate, Crestor and  Morbid obesity --Follow-up as an outpatient.  Continue management as per dietitian including multivitamin.  PMH includes ischemic cardiomyopathy, CVA  Right eye conjunctivitis treated by palliative medicine with TobraDex started 8/29 > 9/5 --Appears to be nearly resolved at this point.   Resolved Hospital Problem list   Acute hypoxic/hypercapnic respiratory failure  Acute encephalopathy  Possible UTI treated with 5 days ceftriaxone  Acute diastolic CHF    Today's assessment: S: Feels okay, breathing okay, no complaints.  Ready to go home. Chart notable for refusing BiPAP overnight, and appropriate interactions with male  staff O: Vitals:  Vitals:   04/24/19 0753 04/24/19 0807  BP: (!) 160/85   Pulse: (!) 46 78  Resp: 20 18  Temp: 98.2 F (36.8 C)   SpO2: 100% 98%    Constitutional:   Appears calm and comfortable sitting in chair Eyes:   Appear grossly normal ENMT:   grossly normal hearing  Respiratory:   CTA bilaterally, no w/r/r.   Respiratory effort normal.  Cardiovascular:   RRR, no m/r/g  No significant LE extremity edema   Dorsalis pedis pulses 1+ bilaterally  Dependent rubor noted Psychiatric:   Mental status o Mood, affect appropriate  CBG stable  Discharge Instructions  Discharge Instructions    Diet - low sodium heart healthy   Complete by: As directed    Diet Carb Modified   Complete by: As directed    Discharge instructions   Complete by: As directed    Call your physician or seek immediate medical attention for shortness of breath, confusion, lethargy, pain, weight gain, swelling or worsening of condition.   Increase activity slowly   Complete by: As directed      Allergies as of 04/24/2019      Reactions   Morphine And Related Anaphylaxis   "makes me stop breathing"   Novocain [procaine] Anaphylaxis   Carbamazepine    shaking   Codeine Other (See Comments)   "makes heart race" per patient   Gabapentin Other (See Comments)   Blood in urine   Ivp Dye [iodinated Diagnostic Agents]    Passing out   Other    Steroids makes hearts race   Prednisone    Heart racing      Medication List    TAKE these medications   albuterol 108 (90 Base) MCG/ACT inhaler Commonly known as: VENTOLIN HFA Inhale 2 puffs into the lungs every 6 (six) hours as needed for wheezing or shortness of breath.   amiodarone 100 MG tablet Commonly known as: PACERONE Take 1 tablet (100 mg  total) by mouth daily.   apixaban 5 MG Tabs tablet Commonly known as: Eliquis Take 1 tablet (5 mg total) by mouth 2 (two) times daily.   artificial tears Oint ophthalmic ointment Place 1  application into both eyes at bedtime as needed for dry eyes.   budesonide-formoterol 160-4.5 MCG/ACT inhaler Commonly known as: Symbicort Inhale 2 puffs into the lungs 2 (two) times daily.   carvedilol 12.5 MG tablet Commonly known as: COREG Take 1 tablet (12.5 mg total) by mouth 2 (two) times daily with a meal.   docusate sodium 100 MG capsule Commonly known as: COLACE Take 100 mg by mouth at bedtime.   DuoDERM Hydroactive Gel duoderm or generic equivalent gel dressing. 4cmx4cm. Apply to wound q 3 days until healed.   feeding supplement (PRO-STAT SUGAR FREE 64) Liqd Take 30 mLs by mouth 2 (two) times daily.   guaiFENesin 600 MG 12 hr tablet Commonly known as: MUCINEX Take 2 tablets (1,200 mg total) by mouth 2 (two) times daily.   HYDROcodone-acetaminophen 10-325 MG tablet Commonly known as: NORCO TAKE 1 TABLET BY MOUTH EVERY 6 HOURS AS NEEDED FOR PAIN MUST LAST 30 DAYS What changed:   how much to take  how to take this  when to take this  reasons to take this  additional instructions   hydroxypropyl methylcellulose / hypromellose 2.5 % ophthalmic solution Commonly known as: ISOPTO TEARS / GONIOVISC Place 1 drop into both eyes 3 (three) times daily as needed for dry eyes.   Insulin Pen Needle 31G X 8 MM Misc 1 Units by Does not apply route 2 (two) times a day.   ipratropium 0.02 % nebulizer solution Commonly known as: ATROVENT Take 2.5 mLs (0.5 mg total) by nebulization 2 (two) times daily.   isosorbide mononitrate 30 MG 24 hr tablet Commonly known as: IMDUR Take 1 tablet (30 mg total) by mouth daily. What changed: when to take this   Lantus 100 UNIT/ML injection Generic drug: insulin glargine INJECT 45 UNITS INTO THE SKIN AT BEDTIME FOR 120 DOSES What changed: See the new instructions.   levalbuterol 0.63 MG/3ML nebulizer solution Commonly known as: XOPENEX Take 3 mLs (0.63 mg total) by nebulization 2 (two) times daily.   levalbuterol 0.63 MG/3ML  nebulizer solution Commonly known as: XOPENEX Take 3 mLs (0.63 mg total) by nebulization every 6 (six) hours as needed for wheezing or shortness of breath.   multivitamin with minerals Tabs tablet Take 1 tablet by mouth daily. Start taking on: April 25, 2019   nystatin powder Commonly known as: MYCOSTATIN/NYSTOP APPLY  1  GRAM TOPICALLY TWICE DAILY AS NEEDED What changed:   how much to take  how to take this  when to take this  reasons to take this  additional instructions   ONE TOUCH ULTRA MINI w/Device Kit Use as directed twice a day   ONE TOUCH ULTRA TEST test strip Generic drug: glucose blood Use as instructed twice a day   onetouch ultrasoft lancets Use as instructed   potassium chloride SA 20 MEQ tablet Commonly known as: K-DUR Take 2 tablets (40 mEq total) by mouth 2 (two) times daily.   rosuvastatin 20 MG tablet Commonly known as: CRESTOR Take 1 tablet (20 mg total) by mouth daily at 6 PM.   tiZANidine 4 MG tablet Commonly known as: ZANAFLEX TAKE 1 TABLET BY MOUTH FOUR TIMES DAILY AS NEEDED FOR MUSCLE SPASMS What changed:   how much to take  how to take this  when to  take this  reasons to take this  additional instructions   tobramycin-dexamethasone ophthalmic solution Commonly known as: TOBRADEX Place 2 drops into the right eye every 6 (six) hours for 2 days.   topiramate 100 MG tablet Commonly known as: TOPAMAX Take 1 tablet (100 mg total) by mouth at bedtime.   torsemide 20 MG tablet Commonly known as: DEMADEX TAKE 3 TABLETS (60 MG TOTAL) BY MOUTH 2 TIMES DAILY. What changed: See the new instructions.            Durable Medical Equipment  (From admission, onward)         Start     Ordered   04/24/19 1120  For home use only DME Nebulizer machine  Once    Question Answer Comment  Patient needs a nebulizer to treat with the following condition Hypoxia   Length of Need Lifetime      04/24/19 1120         Allergies    Allergen Reactions   Morphine And Related Anaphylaxis    "makes me stop breathing"   Novocain [Procaine] Anaphylaxis   Carbamazepine     shaking   Codeine Other (See Comments)    "makes heart race" per patient   Gabapentin Other (See Comments)    Blood in urine   Ivp Dye [Iodinated Diagnostic Agents]     Passing out   Other     Steroids makes hearts race   Prednisone     Heart racing    The results of significant diagnostics from this hospitalization (including imaging, microbiology, ancillary and laboratory) are listed below for reference.    Significant Diagnostic Studies: Dg Chest Port 1 View  Result Date: 04/23/2019 CLINICAL DATA:  Shortness of breath. EXAM: PORTABLE CHEST 1 VIEW COMPARISON:  04/22/2019 and 02/21/2016 FINDINGS: Heart size and pulmonary vascularity within normal limits. CABG. No infiltrates or effusions. Slight scarring at the left lung base. IMPRESSION: No active disease. Electronically Signed   By: Lorriane Shire M.D.   On: 04/23/2019 09:33   Dg Chest Port 1 View  Result Date: 04/22/2019 CLINICAL DATA:  58 year old male with history of shortness of breath. History of congestive heart failure. EXAM: PORTABLE CHEST 1 VIEW COMPARISON:  Chest x-ray 04/16/2019. FINDINGS: Lung volumes are normal. No consolidative airspace disease. No pleural effusions. No evidence of pulmonary edema. Heart size is mildly enlarged. Upper mediastinal contours are within normal limits. Status post median sternotomy for CABG. IMPRESSION: 1. No radiographic evidence of acute cardiopulmonary disease. 2. Status post CABG. Electronically Signed   By: Vinnie Langton M.D.   On: 04/22/2019 09:06   Dg Chest Port 1 View  Result Date: 04/16/2019 CLINICAL DATA:  Extubated EXAM: PORTABLE CHEST 1 VIEW COMPARISON:  Chest radiograph from one day prior. FINDINGS: Stable configuration of median sternotomy wires with discontinuity in the lower most wire. Stable cardiomediastinal silhouette with  moderate cardiomegaly. No pneumothorax. No pleural effusion. Mild pulmonary edema appears improved. Interval extubation. IMPRESSION: Interval extubation.  Mild congestive heart failure is improved. Electronically Signed   By: Ilona Sorrel M.D.   On: 04/16/2019 08:08   Dg Chest Port 1 View  Result Date: 04/15/2019 CLINICAL DATA:  Ventilator dependent. EXAM: PORTABLE CHEST 1 VIEW COMPARISON:  04/14/2019 FINDINGS: Endotracheal tube is poorly characterized but appears to be appropriately positioned above the carina. Again noted is an enlarged cardiac silhouette with median sternotomy wires. Patchy lung densities particularly in the mid and lower right chest. Negative for pneumothorax. Nasogastric tube extends into  the lower chest but the tip is not well visualized. IMPRESSION: 1. No significant change in the patchy lung densities. Findings could represent a combination patchy airspace disease and atelectasis. 2. Limited evaluation of the support apparatuses. However, the endotracheal tube does appear to be appropriately positioned above the carina. Electronically Signed   By: Markus Daft M.D.   On: 04/15/2019 10:11   Dg Chest Port 1 View  Result Date: 04/14/2019 CLINICAL DATA:  Acute on chronic respiratory failure. EXAM: PORTABLE CHEST 1 VIEW COMPARISON:  04/13/2019. FINDINGS: Endotracheal tube and NG tube in stable position. Prior CABG. Cardiomegaly with pulmonary venous congestion. Diffuse bilateral pulmonary infiltrates/edema noted with progression from prior exam. Small bilateral pleural effusion noted. No pneumothorax. IMPRESSION: 1.  Lines and tubes stable position. 2. Prior CABG. Cardiomegaly with pulmonary venous congestion and progressive bilateral pulmonary infiltrates/edema. Small bilateral pleural effusions. Findings on today's exam suggest CHF. Electronically Signed   By: Marcello Moores  Register   On: 04/14/2019 06:24   Dg Chest Port 1 View  Result Date: 04/13/2019 CLINICAL DATA:  Post intubation,  respiratory distress EXAM: PORTABLE CHEST 1 VIEW COMPARISON:  04/13/2019 at 0551 hours FINDINGS: Endotracheal tube terminates 3 cm above the carina. Enteric tube courses into the stomach. Multifocal patchy/interstitial opacities, some of which are peripheral, which raises concern for atypical/viral infection in the appropriate clinical setting, although interstitial edema remains possible. Associated bibasilar opacities. Small left pleural effusion. The heart is normal in size. Median sternotomy. IMPRESSION: Endotracheal tube terminates 3 cm above the carina. Enteric tube courses into the stomach. Multifocal patchy/interstitial opacities, raising concern for atypical/viral infection in the appropriate clinical setting, although interstitial edema remains possible. Small left pleural effusion. Electronically Signed   By: Julian Hy M.D.   On: 04/13/2019 19:40   Dg Chest Port 1 View  Result Date: 04/13/2019 CLINICAL DATA:  Severe respiratory distress, hypoxia EXAM: PORTABLE CHEST 1 VIEW COMPARISON:  03/23/2019 FINDINGS: Prior CABG. Cardiomegaly with vascular congestion and bilateral airspace opacities concerning for edema/CHF. Somewhat more confluent opacity in both lower lobes could reflect edema or infection. No visible effusions or acute bony abnormality. IMPRESSION: Cardiomegaly with vascular congestion and bilateral airspace opacities, likely edema/CHF. Bibasilar edema versus infection. Electronically Signed   By: Rolm Baptise M.D.   On: 04/13/2019 18:57    Microbiology: Recent Results (from the past 240 hour(s))  Culture, blood (routine x 2)     Status: None (Preliminary result)   Collection Time: 04/22/19  3:30 PM   Specimen: BLOOD RIGHT HAND  Result Value Ref Range Status   Specimen Description BLOOD RIGHT HAND  Final   Special Requests   Final    BOTTLES DRAWN AEROBIC AND ANAEROBIC Blood Culture adequate volume   Culture   Final    NO GROWTH 2 DAYS Performed at [facility], [place] [address], [place], [place] [address]    Report Status PENDING  Incomplete  Culture, blood (routine x 2)     Status: None (Preliminary result)   Collection Time: 04/22/19  3:35 PM   Specimen: BLOOD  Result Value Ref Range Status   Specimen Description BLOOD LEFT ANTECUBITAL  Final   Special Requests   Final    BOTTLES DRAWN AEROBIC AND ANAEROBIC Blood Culture adequate volume   Culture   Final    NO GROWTH 2 DAYS Performed at [facility], [place] [address] [place], [place] [address]    Report Status PENDING  Incomplete     Labs: Basic  Metabolic Panel: Recent Labs  Lab 04/18/19 0721 04/20/19 0739 04/21/19 0818 04/22/19 0808 04/23/19 0425  NA 137 129* 130* 132* 135  K 3.3* 3.2* 2.9* 3.5 3.2*  CL 86* 78* 79* 84* 88*  CO2 34* _0 GLUCOSE 207* 286* 244* 218* 159*  BUN 91* 98* 94* 78* 67*  CREATININE 3.09* 3.20* 3.10* 2.62* 2.64*  CALCIUM 9.4 8.9 8.8* 8.8* 9.1  MG 2.3  --   --  2.1 1.9  PHOS 3.7  --   --  4.8* 4.4   Liver Function Tests: Recent Labs  Lab 04/22/19 0808 04/23/19 0425  AST 25 28  ALT 29 38  ALKPHOS 229* 248*  BILITOT 0.9 0.6  PROT 7.5 7.4  ALBUMIN 3.6 3.4*   CBC: Recent Labs  Lab 04/18/19 0721 04/22/19 0808 04/23/19 0425  WBC 14.9* 16.9* 14.5*  NEUTROABS 10.9* 11.4* 9.2*  HGB 14.7 14.6 13.9  HCT 47.0 45.6 44.2  MCV 94.4 93.4 93.4  PLT 257 171 197    Recent Labs    02/22/19 0324 03/21/19 2130 04/13/19 1623  BNP 230.6* 282.0* 301.3*    CBG: Recent Labs  Lab 04/23/19 1138 04/23/19 1657 04/23/19 2137 04/24/19 0747 04/24/19 1147  GLUCAP 294* 166* 143* 147* 287*    Principal Problem:   Acute on chronic respiratory failure with hypoxia and hypercapnia (HCC) Active Problems:   Obesity, Class III, BMI 40-49.9 (morbid obesity) (HCC)   Conjunctivitis   AKI (acute kidney injury) ([place])   CKD stage 3 secondary to diabetes (HCC)   IDDM (insulin dependent diabetes mellitus) (HCC)   Chronic obstructive pulmonary  disease (HCC)   Acute on chronic diastolic heart failure (HCC)   CKD (chronic kidney disease), stage IV ([place])   Time coordinating discharge: 35 minutes  Signed:  Murray Hodgkins, MD  [facility]  04/24/2019, 12:51 PM

## 2019-04-24 NOTE — Procedures (Signed)
Attempted Bipap however patient tolerated it for less then 2 minutes.  He states "I cant stand it blowing by my ear all night and keeping me awake".  Placed back on 3lpm  as previously on.

## 2019-04-24 NOTE — Telephone Encounter (Signed)
Copied from [place] [phone]. Topic: Appointment Scheduling - Scheduling Inquiry for Clinic >> Apr 24, 2019  2:11 PM Alanda Slim E wrote: Reason for CRM: Pt will be discharged from the hospital today for Chronic respiratory system and will need a hosp f/u within a week from today with Dr. Ethlyn Gallery. Nothing available on her schedule until 9.30.20/ Please follow up with Pt and advise

## 2019-04-24 NOTE — TOC Transition Note (Addendum)
Transition of Care [facility]) - CM/SW Discharge Note   Patient Details  Name: [name]. MRN: [id number] Date of Birth: [birth year]  Transition of Care [facility]) CM/SW Contact:  Maryclare Labrador, RN Phone Number: 04/24/2019, 11:07 AM   Clinical Narrative:  Pt will discharge home today via private vehicle driven by his wife. CM confirmed with pt/wife/ [employer] that agency respiratory therapist will meet at pts home today at 5pm to set up equipment and provide necessary training.   [facility] confirms that respiratory care as it relates to the trilogy will be provided by agency 24/7.  [facility] aware that pt will discharge home today with triology and will initiate ordered [facility] services.  No other CM needs - CM signing off  Update:  Pt can not afford his copay for [place] - per [facility] pts copay will be $212 per month.  Attending modified discharge medications, pt informed of prices.  Unit secretary to set up post discharge follow up appt with PCP  Final next level of care: [place] Barriers to Discharge: Barriers Resolved   Patient Goals and CMS Choice        Discharge Placement                       Discharge Plan and Services                                     Social Determinants of Health (SDOH) Interventions     Readmission Risk Interventions Readmission Risk Prevention Plan 04/18/2019 02/24/2019  Transportation Screening Complete Complete  PCP or Specialist Appt within 3-5 Days - Not Complete  Not Complete comments - see above  [place] or [place] - Complete  Social Work Consult for [place] Planning/Counseling - Complete  Palliative Care Screening - Not Applicable  Medication Review Press [employer]) Complete Complete  PCP or Specialist appointment within 3-5 days of discharge Patient refused -  Some recent data might be hidden

## 2019-04-24 NOTE — Patient Outreach (Signed)
  [place] [facility]) Care Management Chronic Special Needs Program  04/24/2019  Name: [name]. DOB: 1960/10/02  MRN: [id number]  [name] is enrolled in a chronic special needs plan for Heart Failure. Reviewed and updated care plan.  Client admitted 04/13/2019-04/24/2019 with respiratory failure/congestive heart failure. Discharge home with home health.  Goals Addressed            This Visit's Progress   . Client will not be readmitted within 30 days (C-SNP)-updated discharge date 04/24/2019       Please follow discharge instructions and call provider if you have any questions. Please attend all follow up appointments as scheduled. Please take your medications as prescribed. Please call 24 hour nurse advice line as needed([phone]).        Plan: send updated care plan to client; primary care provider and heart failure clinic. [facility] community care management to reach out to client to engage. RNCM will continue to follow.   Thea Silversmith, RN, MSN, [place] [phone]

## 2019-04-24 NOTE — Progress Notes (Signed)
Pt has frequent requests to replace condom cath because it "came off". Pt asking inappropriate questions about staff romantic relationship status. Staff is visiting pt with second person. Charge RN previously walked in to pt request and found pt with gown up exposing himself. Male NT & this RN entered room and condom cath replaced by male NT.

## 2019-04-25 ENCOUNTER — Telehealth: Payer: Self-pay | Admitting: *Deleted

## 2019-04-25 ENCOUNTER — Ambulatory Visit (INDEPENDENT_AMBULATORY_CARE_PROVIDER_SITE_OTHER): Payer: HMO | Admitting: Family Medicine

## 2019-04-25 ENCOUNTER — Encounter: Payer: Self-pay | Admitting: Family Medicine

## 2019-04-25 ENCOUNTER — Other Ambulatory Visit: Payer: Self-pay

## 2019-04-25 DIAGNOSIS — I25700 Atherosclerosis of coronary artery bypass graft(s), unspecified, with unstable angina pectoris: Secondary | ICD-10-CM

## 2019-04-25 DIAGNOSIS — Z9981 Dependence on supplemental oxygen: Secondary | ICD-10-CM

## 2019-04-25 DIAGNOSIS — E119 Type 2 diabetes mellitus without complications: Secondary | ICD-10-CM

## 2019-04-25 DIAGNOSIS — IMO0001 Reserved for inherently not codable concepts without codable children: Secondary | ICD-10-CM

## 2019-04-25 DIAGNOSIS — Z794 Long term (current) use of insulin: Secondary | ICD-10-CM | POA: Diagnosis not present

## 2019-04-25 DIAGNOSIS — I5032 Chronic diastolic (congestive) heart failure: Secondary | ICD-10-CM | POA: Diagnosis not present

## 2019-04-25 DIAGNOSIS — G4733 Obstructive sleep apnea (adult) (pediatric): Secondary | ICD-10-CM | POA: Diagnosis not present

## 2019-04-25 DIAGNOSIS — Z6841 Body Mass Index (BMI) 40.0 and over, adult: Secondary | ICD-10-CM

## 2019-04-25 DIAGNOSIS — J449 Chronic obstructive pulmonary disease, unspecified: Secondary | ICD-10-CM | POA: Diagnosis not present

## 2019-04-25 NOTE — Progress Notes (Signed)
Virtual Visit via Telephone Note  I connected with [name].  on 04/25/19 at  1:30 PM EDT by telephone and verified that I am speaking with the correct person using two identifiers.   I discussed the limitations, risks, security and privacy concerns of performing an evaluation and management service by telephone and the availability of in person appointments. I also discussed with the patient that there may be a patient responsible charge related to this service. The patient expressed understanding and agreed to proceed.  Location patient: home Location provider: work office Participants present for the call: patient, provider Patient did not have a visit in the prior 7 days to address this/these issue(s).   History of Present Illness: Recently hospitalized 8/23-04/24/19 for acute on chronic hypoxic/hypercapnic resp failure assoc w acute encephalopathy. ON 3L O2.   ?sleep study to be arranged by cardiology: cardiology appointment next week.  Secondary hypothyroidism: recommended endo to follow as outpatient.   Doesn't do well wearing bipap. Couldn't tolerate this in hospital, but states one he has at home seems better. He will try this tonight.   Didn't get medication for nebulizer sent to house so has no medication to use in this. Was using xopenex in hospital because albuterol made heart race too much.   Limited with transportation because wife has no more time off of work.   States that he is on 3L oxygen at all times. This is humidified. He states he is never getting off the oxygen.   He does not want to do a sleep study.   He states he will not take inhaled steroids because they make his heart race. Needs "nonsteroid" inhalers. Uses the ventolin inhaler if needed but just about once a year.   Doesn't want PT coming to house. Doesn't want to pay for anyone coming out to house.    Observations/Objective: Patient sounds well on the phone.  I do not appreciate any  SOB. Speech and thought processing are grossly intact.   Assessment and Plan: 1. Chronic obstructive pulmonary disease, unspecified COPD type [facility]) Patient does not wish to see pulmonology.  We discussed that this is a very good idea since he is dependent on oxygen.  He will not use any inhaler that has a steroid component since this elevates his heart rate.  Symbicort was sent in from the hospital for him, but he does not wish to take this.  We did confirm with the pharmacy that other nebulizer solutions were sent in for him and discussed using Atrovent twice a day and Xopenex as directed and as needed to help with breathing.  2. Dependence on supplemental oxygen Currently dependent on 3 L of oxygen continuous.  3. Chronic diastolic heart failure [facility]) Has appointment with cardiology coming up.  Is following with them regularly.  They did follow with him in the hospital.  4. Morbid obesity with BMI of 50.0-59.9, adult ([place]) Difficulty with being active due to multiple medical conditions.  Suggested physical therapy due to deconditioning, but he states he will never do this again and did not want to spend money on it because he can do exercises on his own if desired.  5. OSA (obstructive sleep apnea) Refuses sleep study.  He does not want to follow-up with specialist to complete this.  He does not like the equipment and does not want further evaluation.  6. IDDM (insulin dependent diabetes mellitus) (HCC) A1c has been well controlled.  Continue current medications.  7. Coronary  artery disease involving coronary bypass graft of native heart with unstable angina pectoris [facility]) Following with cardiology.   Follow Up Instructions:  He has some interest in having provider come out to the house to see him.  I think he would do well with a program that involves doctors doing home visits.  He has difficulty with transportation to appointments, since he is dependent on his wife to drive him and  she is unable to miss work on a regular basis.  I do think that if treatment options were offered to him in his own home they were covered by insurance he might be more willing to consider these.  Stated he would call his insurance and ask because he had looked into home visits in the past and was curious about coverage for this.  No follow-ups on file.  I did not refer this patient for an OV in the next 24 hours for this/these issue(s).  I discussed the assessment and treatment plan with the patient. The patient was provided an opportunity to ask questions and all were answered. The patient agreed with the plan and demonstrated an understanding of the instructions.   The patient was advised to call back or seek an in-person evaluation if the symptoms worsen or if the condition fails to improve as anticipated.  I provided 30 minutes of non-face-to-face time during this encounter.   Micheline Rough, MD

## 2019-04-25 NOTE — Telephone Encounter (Signed)
-----   Message from Caren Macadam, MD sent at 04/25/2019  2:13 PM EDT ----- Please make sure that atrovent and xopenex are at his pharmacy. Those were sent from hosp discharge. Have them cancel symbicort sent over. He won't take it because there is steroid in it and it makes his heart race.

## 2019-04-25 NOTE — Telephone Encounter (Signed)
Spoke with Threasa Beards, pharmacist at [facility] stated the Rxs for Atrovent and Xopenex were received and she will cancel the Rx for Symbicort.

## 2019-04-26 DIAGNOSIS — N184 Chronic kidney disease, stage 4 (severe): Secondary | ICD-10-CM | POA: Diagnosis not present

## 2019-04-26 DIAGNOSIS — E1122 Type 2 diabetes mellitus with diabetic chronic kidney disease: Secondary | ICD-10-CM | POA: Diagnosis not present

## 2019-04-26 DIAGNOSIS — I5042 Chronic combined systolic (congestive) and diastolic (congestive) heart failure: Secondary | ICD-10-CM | POA: Diagnosis not present

## 2019-04-26 DIAGNOSIS — E872 Acidosis: Secondary | ICD-10-CM | POA: Diagnosis not present

## 2019-04-26 DIAGNOSIS — Z79891 Long term (current) use of opiate analgesic: Secondary | ICD-10-CM | POA: Diagnosis not present

## 2019-04-26 DIAGNOSIS — J9611 Chronic respiratory failure with hypoxia: Secondary | ICD-10-CM | POA: Diagnosis not present

## 2019-04-26 DIAGNOSIS — Z794 Long term (current) use of insulin: Secondary | ICD-10-CM | POA: Diagnosis not present

## 2019-04-26 DIAGNOSIS — Z6841 Body Mass Index (BMI) 40.0 and over, adult: Secondary | ICD-10-CM | POA: Diagnosis not present

## 2019-04-26 DIAGNOSIS — H109 Unspecified conjunctivitis: Secondary | ICD-10-CM | POA: Diagnosis not present

## 2019-04-26 DIAGNOSIS — E038 Other specified hypothyroidism: Secondary | ICD-10-CM | POA: Diagnosis not present

## 2019-04-26 DIAGNOSIS — I4891 Unspecified atrial fibrillation: Secondary | ICD-10-CM | POA: Diagnosis not present

## 2019-04-26 DIAGNOSIS — E662 Morbid (severe) obesity with alveolar hypoventilation: Secondary | ICD-10-CM | POA: Diagnosis not present

## 2019-04-26 DIAGNOSIS — J449 Chronic obstructive pulmonary disease, unspecified: Secondary | ICD-10-CM | POA: Diagnosis not present

## 2019-04-26 DIAGNOSIS — Z9981 Dependence on supplemental oxygen: Secondary | ICD-10-CM | POA: Diagnosis not present

## 2019-04-26 DIAGNOSIS — I5032 Chronic diastolic (congestive) heart failure: Secondary | ICD-10-CM | POA: Diagnosis not present

## 2019-04-26 DIAGNOSIS — L89322 Pressure ulcer of left buttock, stage 2: Secondary | ICD-10-CM | POA: Diagnosis not present

## 2019-04-26 DIAGNOSIS — G4733 Obstructive sleep apnea (adult) (pediatric): Secondary | ICD-10-CM | POA: Diagnosis not present

## 2019-04-26 DIAGNOSIS — H538 Other visual disturbances: Secondary | ICD-10-CM | POA: Diagnosis not present

## 2019-04-26 DIAGNOSIS — I2510 Atherosclerotic heart disease of native coronary artery without angina pectoris: Secondary | ICD-10-CM | POA: Diagnosis not present

## 2019-04-26 DIAGNOSIS — J9612 Chronic respiratory failure with hypercapnia: Secondary | ICD-10-CM | POA: Diagnosis not present

## 2019-04-26 DIAGNOSIS — R918 Other nonspecific abnormal finding of lung field: Secondary | ICD-10-CM | POA: Diagnosis not present

## 2019-04-26 DIAGNOSIS — Z7901 Long term (current) use of anticoagulants: Secondary | ICD-10-CM | POA: Diagnosis not present

## 2019-04-26 DIAGNOSIS — M549 Dorsalgia, unspecified: Secondary | ICD-10-CM | POA: Diagnosis not present

## 2019-04-26 DIAGNOSIS — D72829 Elevated white blood cell count, unspecified: Secondary | ICD-10-CM | POA: Diagnosis not present

## 2019-04-26 DIAGNOSIS — J9811 Atelectasis: Secondary | ICD-10-CM | POA: Diagnosis not present

## 2019-04-26 DIAGNOSIS — I255 Ischemic cardiomyopathy: Secondary | ICD-10-CM | POA: Diagnosis not present

## 2019-04-27 LAB — CULTURE, BLOOD (ROUTINE X 2)
Culture: NO GROWTH
Culture: NO GROWTH
Special Requests: ADEQUATE
Special Requests: ADEQUATE

## 2019-04-29 ENCOUNTER — Telehealth: Payer: Self-pay | Admitting: *Deleted

## 2019-04-29 NOTE — Telephone Encounter (Signed)
Appt scheduled for 11/9

## 2019-04-29 NOTE — Telephone Encounter (Signed)
-----   Message from Caren Macadam, MD sent at 04/26/2019  6:52 PM EDT ----- Schedule 2 mo CCV

## 2019-04-29 NOTE — Telephone Encounter (Signed)
Patient complains of a "stabbing" in the right temporal area recurrent since this AM. Denies any problems with the left eye, no dizziness, lightheadedness or other symptoms.  States this is the side where he has blindness of the right eye and questioned if this could be related to his CPAP machine as he cannot wear this for long periods?  Feels the pain started after an x-ray at the hospital.  Message sent to Dr Ethlyn Gallery.

## 2019-04-30 ENCOUNTER — Other Ambulatory Visit: Payer: Self-pay

## 2019-04-30 ENCOUNTER — Telehealth: Payer: Self-pay

## 2019-04-30 ENCOUNTER — Ambulatory Visit (INDEPENDENT_AMBULATORY_CARE_PROVIDER_SITE_OTHER): Payer: HMO | Admitting: Family Medicine

## 2019-04-30 ENCOUNTER — Encounter: Payer: Self-pay | Admitting: Family Medicine

## 2019-04-30 VITALS — BP 135/70 | HR 70 | Resp 18

## 2019-04-30 DIAGNOSIS — R300 Dysuria: Secondary | ICD-10-CM

## 2019-04-30 DIAGNOSIS — R519 Headache, unspecified: Secondary | ICD-10-CM

## 2019-04-30 DIAGNOSIS — B379 Candidiasis, unspecified: Secondary | ICD-10-CM

## 2019-04-30 DIAGNOSIS — R51 Headache: Secondary | ICD-10-CM | POA: Diagnosis not present

## 2019-04-30 MED ORDER — FOSFOMYCIN TROMETHAMINE 3 G PO PACK: 3.0000 g | PACK | Freq: Once | ORAL | 0 refills | Status: AC

## 2019-04-30 MED ORDER — NYSTATIN 100000 UNIT/GM EX POWD: CUTANEOUS | 3 refills | Status: AC

## 2019-04-30 NOTE — Patient Outreach (Signed)
  [facility]) Care Management Chronic Special Needs Program   04/30/2019  Name: [name]., DOB: June 06, 1961  MRN: [id number]  The client was discussed in today's interdisciplinary care team meeting.  The following issues were discussed:  Client's needs, Changes in health status, Key risk triggers/risk stratification, Care Plan, Coordination of care, Care transitions and Issues/barriers to care  Participants present:  Bary Castilla, RN, BSN, MS, CCM Thea Silversmith, MSN, BSN, RN,CCM Peter Garter, RN, BSN, CCM, CDE Dr. Maryella Shivers Gilda Crease, PharmD, RPh Belia Heman, MSN, BSN,   Recommendations/Plan: Care coordination with primary care provider; Client would benefit from services of palliative care/Hospice particularly in light of symptom management of pain as client has expressed this as a concern regarding additional providers; continue outreach to client to assist as needed with available resources.  Thea Silversmith, RN, MSN, [place] [phone]

## 2019-04-30 NOTE — Telephone Encounter (Signed)
If he is still having this he needs to be evaluated. Stabbing pain in this area could be related to vascular issue and needs urgent evaluation if not resolved.

## 2019-04-30 NOTE — Telephone Encounter (Signed)
I called the pt and he stated the stabbing sensation is gone now and he feels OK.  Patient complains of pain with urination for the past day.  Appt scheduled with Dr Elease Hashimoto for today at 3:30pm to discuss both issues.

## 2019-04-30 NOTE — Telephone Encounter (Signed)
Copied from [place] [phone]. Topic: Quick Communication - Home Health Verbal Orders >> Apr 30, 2019  9:23 AM Virl Axe D wrote: Caller/Agency: [facility] Number: [phone] Requesting OT/PT/Skilled Nursing/Social Work/Speech Therapy: Skilled Nursing Frequency: 1 week 4

## 2019-04-30 NOTE — Telephone Encounter (Signed)
I left a detailed message on the voicemail for [name] with the orders and info as below.

## 2019-04-30 NOTE — Telephone Encounter (Signed)
[name] for this, but patient adamantly declined all of therapy suggestions I made at last visit? So might want to check with him about this.

## 2019-04-30 NOTE — Progress Notes (Signed)
Patient ID: [name]., male   DOB: 1960-09-03, 58 y.o.   MRN: [id number]  This visit type was conducted due to national recommendations for restrictions regarding the COVID-19 pandemic in an effort to limit this patient's exposure and mitigate transmission in our community.   Virtual Visit via Telephone Note  I connected with [name] on 04/30/19 at  3:30 PM EDT by telephone and verified that I am speaking with the correct person using two identifiers.   I discussed the limitations, risks, security and privacy concerns of performing an evaluation and management service by telephone and the availability of in person appointments. I also discussed with the patient that there may be a patient responsible charge related to this service. The patient expressed understanding and agreed to proceed.  Location patient: home Location provider: work or home office Participants present for the call: patient, provider Patient did not have a visit in the prior 7 days to address this/these issue(s).   History of Present Illness: Patient was worked in by phone call visit today because of the following issues  He complained of a "stabbing "pain right I last night and very early this morning but no symptoms since then.  He states he had similar symptoms in the past.  Denies any headache.  No temporal pain.  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  No history of cluster headache.  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  He is requesting refill of nystatin powder which he uses around his waist area  Patient relates onset this morning of some mild burning with urination.  No fever.  No flank pain.  No chills.  No nausea or vomiting.  Does have history of UTI in the past.  Back in August he had 80,000 colonies of Proteus species and back in the end of July had greater than 100,000 colonies of  Providencia species.  He states he watches his fluid intake closely because of prior history of volume overload problems  Multiple chronic problems including history of CAD, A. fib, chronic diastolic heart failure, history of COPD, obstructive sleep apnea, stage III chronic kidney disease, type 2 diabetes, history of acoustic neuroma.   Observations/Objective: Patient sounds cheerful and well on the phone. I do not appreciate any SOB. Speech and thought processing are grossly intact. Patient reported vitals:  Assessment and Plan:  #1 dysuria.  We explained that ideally we need urinalysis and urine culture especially given his history of resistant pathogens.  However, he states that there is no way he can get here today for visit.  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  Patient states he has had similar stabbing type pain in around the eye in the past.  Currently asymptomatic -Follow-up with primary for any persistent or recurrent symptoms  Follow Up Instructions:  -With primary as scheduled   99441 5-10 99442 11-20 99443 21-30 I did not refer this patient for an OV in the next 24 hours for this/these issue(s).  I discussed the assessment and treatment plan with the patient. The patient was provided an opportunity to ask questions  and all were answered. The patient agreed with the plan and demonstrated an understanding of the instructions.   The patient was advised to call back or seek an in-person evaluation if the symptoms worsen or if the condition fails to improve as anticipated.  I provided 25 minutes of non-face-to-face time during this encounter.   Carolann Littler, MD

## 2019-05-01 DIAGNOSIS — I5042 Chronic combined systolic (congestive) and diastolic (congestive) heart failure: Secondary | ICD-10-CM | POA: Diagnosis not present

## 2019-05-02 ENCOUNTER — Telehealth: Payer: Self-pay | Admitting: *Deleted

## 2019-05-02 ENCOUNTER — Other Ambulatory Visit: Payer: Self-pay

## 2019-05-02 NOTE — Telephone Encounter (Signed)
Spoke with patient about md requesting he get a sleep study. Pt reports he has a CPAP at home and do not want the sleep study. Dr Aundra Dubin made aware of same. No further action needed at this time

## 2019-05-02 NOTE — Patient Outreach (Signed)
  [facility] [facility]) Care Management Chronic Special Needs Program   05/01/2019   Name: Frank Meng., DOB: [birth year]  MRN: [id number]  The client was discussed-interdisciplinary care team.  The following issues were discussed:  Client's needs, Coordination of care, Care transitions and Issues/barriers to care  Participants present:  [name], RNCM(utilization management) completing transition of care; [name], RNCM  Plan: [facility] will continue care coordination with utilization management as needed. RNCM will continue to follow as client's chronic care management coordinator.   Frank Silversmith, RN, MSN, [name] [phone]

## 2019-05-02 NOTE — Telephone Encounter (Signed)
Copied from [place] [id number]. Topic: General - Other >> May 02, 2019  9:07 AM [name] wrote: Reason for CRM: Patient would like for the nurse to call him back regarding his medication for an iron supplement for his kidney.  He said the pharmacist has not received a script for this medication.  Please advise and let the patient know the status.  CB# [phone]

## 2019-05-02 NOTE — Telephone Encounter (Signed)
I called the pt and informed him the Rx for Nystatin powder will be signed by Dr Elease Hashimoto and Apolonio Schneiders [facility]

## 2019-05-05 ENCOUNTER — Telehealth: Payer: Self-pay | Admitting: *Deleted

## 2019-05-05 DIAGNOSIS — J449 Chronic obstructive pulmonary disease, unspecified: Secondary | ICD-10-CM | POA: Diagnosis not present

## 2019-05-05 DIAGNOSIS — E662 Morbid (severe) obesity with alveolar hypoventilation: Secondary | ICD-10-CM | POA: Diagnosis not present

## 2019-05-05 NOTE — Care Management (Signed)
CM contacted by Edwyna Ready with [employer] requesting final triology settings order be signed by discharging/ordering doctor. Neither discharging Dr Sarajane Jews nor ordering Dr Sloan Leiter are on service today.  CM contacted Hospitalist office at [phone] and informed office of request and time sensitivity.  Office requested document be faxed to [phone] and office will pass along to one of the doctors for signature.  CM provided fax number to [place] liaison.

## 2019-05-06 ENCOUNTER — Ambulatory Visit: Payer: HMO

## 2019-05-07 ENCOUNTER — Other Ambulatory Visit: Payer: Self-pay

## 2019-05-07 NOTE — Patient Outreach (Addendum)
  [facility]) Care Management Chronic Special Needs Program  05/07/2019  Name: [name]. DOB: [birth year]  MRN: [id number]  [name] is enrolled in a chronic special needs plan for Heart Failure. RNCM called to follow up post hospitalization.  Subjective: I have an appointment with my heart doctor on Friday. Advanced heart failure clinic. He states he has transportation to his appointment and that his wife is able to get off to take him to his provider appointments. Client reports swelling in both fee. He states he is taking his diuretic (3 pills in am and 3 pills in afternoon). He states he is watching his salt and monitoring how much output he is having with how much he is drinking. Client reports home health nurse was out to see him on yesterday. Client reports he has called to have them come pick up the Trilogy, because he started having trouble with it. [name] reports, the company has come to check and reset his machine. Currently he still does not want the Trilogy and states he does not want to pay for the cost of the machine.   Goals Addressed   None    RNCM reinforced the importance of attending provider visits and encouraged client to contact RNCM if transportation becomes an issue. RNCM reinforced the benefit of [facility] providers and encouraged client to participate. Home visit scheduled for 05/08/2019 with [facility].  Plan: RNCM will continue to follow.    Thea Silversmith, RN, MSN, [place] [phone]   .

## 2019-05-07 NOTE — Patient Outreach (Addendum)
  [facility]) Care Management Chronic Special Needs Program   05/07/2019  Name: [name]., DOB: Dec 24, 1960  MRN: [id number]  The client was discussed in today's interdisciplinary care team meeting.  The following issues were discussed:  Client's needs, Changes in health status, Care Plan, Coordination of care, Care transitions and Issues/barriers to care  Participants present:  Mahlon Gammon, MSN, RN, CCM, CNS  Melissa Sandlin RN,BSN,CCM, CDE Thea Silversmith, MSN, BSN, RN, CCM Kelli Churn, RN Maryella Shivers, MD Coralie Carpen, MD  Client has declined participation with [facility] care management services at this time, therefore home visit that was scheduled for tomorrow is canceled.  Care coordination-RNCM communicated with primary care provider re: discussion with client-request discussion with client about [facility] services and palliative care discussion.  Plan: RNCM will continue to follow as client's chronic care management coordinator. RNCM will follow up with client next week.   Thea Silversmith, RN, MSN, [place] [phone]

## 2019-05-08 ENCOUNTER — Other Ambulatory Visit: Payer: Self-pay

## 2019-05-08 NOTE — Patient Outreach (Signed)
  [place]) Care Management Chronic Special Needs Program   05/08/2019  Name: [name]., DOB: Aug 31, 1960  MRN: [id number]  The client was discussed with interdisciplinary care team. RNCM called advanced heart failure clinic, spoke with Chantel. The following issues were discussed:   Client's needs, Changes in health status, Coordination of care, Care transitions and Issues/barriers to care.   RNCM reinforced attempts to get client more resources into his home to help him with management his health conditions. RNCM inquired about possibility of Paramedicine team. Chantel reports will make notation for provider to address at next visit, and follow up with social worker. Also informed Chantel that RNCM's notes from yesterday sent to provider.  Plan: RNCM will continue to follow.  Thea Silversmith, RN, MSN, [place] [phone]

## 2019-05-09 ENCOUNTER — Encounter (HOSPITAL_COMMUNITY): Payer: Self-pay

## 2019-05-09 ENCOUNTER — Ambulatory Visit (HOSPITAL_BASED_OUTPATIENT_CLINIC_OR_DEPARTMENT_OTHER)
Admit: 2019-05-09 | Discharge: 2019-05-09 | Disposition: A | Discharge status: Home / Self Care | Payer: HMO | Source: Ambulatory Visit | Attending: Adult Health | Admitting: Adult Health

## 2019-05-09 ENCOUNTER — Other Ambulatory Visit: Payer: Self-pay

## 2019-05-09 VITALS — BP 137/65 | HR 97

## 2019-05-09 DIAGNOSIS — J9622 Acute and chronic respiratory failure with hypercapnia: Secondary | ICD-10-CM | POA: Diagnosis present

## 2019-05-09 DIAGNOSIS — I5042 Chronic combined systolic (congestive) and diastolic (congestive) heart failure: Secondary | ICD-10-CM | POA: Diagnosis not present

## 2019-05-09 DIAGNOSIS — R0602 Shortness of breath: Secondary | ICD-10-CM | POA: Diagnosis not present

## 2019-05-09 DIAGNOSIS — Z794 Long term (current) use of insulin: Secondary | ICD-10-CM | POA: Diagnosis not present

## 2019-05-09 DIAGNOSIS — G894 Chronic pain syndrome: Secondary | ICD-10-CM | POA: Diagnosis not present

## 2019-05-09 DIAGNOSIS — Z7401 Bed confinement status: Secondary | ICD-10-CM | POA: Diagnosis not present

## 2019-05-09 DIAGNOSIS — I959 Hypotension, unspecified: Secondary | ICD-10-CM | POA: Diagnosis not present

## 2019-05-09 DIAGNOSIS — J9611 Chronic respiratory failure with hypoxia: Secondary | ICD-10-CM | POA: Diagnosis not present

## 2019-05-09 DIAGNOSIS — Z8673 Personal history of transient ischemic attack (TIA), and cerebral infarction without residual deficits: Secondary | ICD-10-CM | POA: Insufficient documentation

## 2019-05-09 DIAGNOSIS — E662 Morbid (severe) obesity with alveolar hypoventilation: Secondary | ICD-10-CM | POA: Diagnosis not present

## 2019-05-09 DIAGNOSIS — J9612 Chronic respiratory failure with hypercapnia: Secondary | ICD-10-CM | POA: Diagnosis not present

## 2019-05-09 DIAGNOSIS — I255 Ischemic cardiomyopathy: Secondary | ICD-10-CM | POA: Insufficient documentation

## 2019-05-09 DIAGNOSIS — K219 Gastro-esophageal reflux disease without esophagitis: Secondary | ICD-10-CM | POA: Diagnosis not present

## 2019-05-09 DIAGNOSIS — Z9981 Dependence on supplemental oxygen: Secondary | ICD-10-CM | POA: Insufficient documentation

## 2019-05-09 DIAGNOSIS — T502X5A Adverse effect of carbonic-anhydrase inhibitors, benzothiadiazides and other diuretics, initial encounter: Secondary | ICD-10-CM | POA: Diagnosis not present

## 2019-05-09 DIAGNOSIS — Z7189 Other specified counseling: Secondary | ICD-10-CM | POA: Diagnosis not present

## 2019-05-09 DIAGNOSIS — G4733 Obstructive sleep apnea (adult) (pediatric): Secondary | ICD-10-CM | POA: Diagnosis present

## 2019-05-09 DIAGNOSIS — Z888 Allergy status to other drugs, medicaments and biological substances status: Secondary | ICD-10-CM | POA: Diagnosis not present

## 2019-05-09 DIAGNOSIS — M6289 Other specified disorders of muscle: Secondary | ICD-10-CM

## 2019-05-09 DIAGNOSIS — Z20828 Contact with and (suspected) exposure to other viral communicable diseases: Secondary | ICD-10-CM | POA: Diagnosis present

## 2019-05-09 DIAGNOSIS — M2550 Pain in unspecified joint: Secondary | ICD-10-CM | POA: Diagnosis not present

## 2019-05-09 DIAGNOSIS — M6281 Muscle weakness (generalized): Secondary | ICD-10-CM | POA: Diagnosis not present

## 2019-05-09 DIAGNOSIS — R41841 Cognitive communication deficit: Secondary | ICD-10-CM | POA: Diagnosis not present

## 2019-05-09 DIAGNOSIS — E669 Obesity, unspecified: Secondary | ICD-10-CM | POA: Insufficient documentation

## 2019-05-09 DIAGNOSIS — Z87442 Personal history of urinary calculi: Secondary | ICD-10-CM | POA: Insufficient documentation

## 2019-05-09 DIAGNOSIS — Z515 Encounter for palliative care: Secondary | ICD-10-CM | POA: Diagnosis present

## 2019-05-09 DIAGNOSIS — Z885 Allergy status to narcotic agent status: Secondary | ICD-10-CM | POA: Diagnosis not present

## 2019-05-09 DIAGNOSIS — I10 Essential (primary) hypertension: Secondary | ICD-10-CM | POA: Diagnosis not present

## 2019-05-09 DIAGNOSIS — J9690 Respiratory failure, unspecified, unspecified whether with hypoxia or hypercapnia: Secondary | ICD-10-CM | POA: Diagnosis not present

## 2019-05-09 DIAGNOSIS — J9600 Acute respiratory failure, unspecified whether with hypoxia or hypercapnia: Secondary | ICD-10-CM | POA: Diagnosis not present

## 2019-05-09 DIAGNOSIS — E1165 Type 2 diabetes mellitus with hyperglycemia: Secondary | ICD-10-CM | POA: Diagnosis present

## 2019-05-09 DIAGNOSIS — I2510 Atherosclerotic heart disease of native coronary artery without angina pectoris: Secondary | ICD-10-CM | POA: Diagnosis present

## 2019-05-09 DIAGNOSIS — I5031 Acute diastolic (congestive) heart failure: Secondary | ICD-10-CM | POA: Diagnosis not present

## 2019-05-09 DIAGNOSIS — J9602 Acute respiratory failure with hypercapnia: Secondary | ICD-10-CM | POA: Diagnosis present

## 2019-05-09 DIAGNOSIS — E876 Hypokalemia: Secondary | ICD-10-CM | POA: Diagnosis not present

## 2019-05-09 DIAGNOSIS — R069 Unspecified abnormalities of breathing: Secondary | ICD-10-CM | POA: Diagnosis not present

## 2019-05-09 DIAGNOSIS — J9621 Acute and chronic respiratory failure with hypoxia: Secondary | ICD-10-CM | POA: Diagnosis present

## 2019-05-09 DIAGNOSIS — I130 Hypertensive heart and chronic kidney disease with heart failure and stage 1 through stage 4 chronic kidney disease, or unspecified chronic kidney disease: Secondary | ICD-10-CM | POA: Diagnosis present

## 2019-05-09 DIAGNOSIS — N183 Chronic kidney disease, stage 3 unspecified: Secondary | ICD-10-CM | POA: Diagnosis not present

## 2019-05-09 DIAGNOSIS — G510 Bell's palsy: Secondary | ICD-10-CM | POA: Diagnosis not present

## 2019-05-09 DIAGNOSIS — G9340 Encephalopathy, unspecified: Secondary | ICD-10-CM | POA: Diagnosis not present

## 2019-05-09 DIAGNOSIS — Z9119 Patient's noncompliance with other medical treatment and regimen: Secondary | ICD-10-CM | POA: Diagnosis not present

## 2019-05-09 DIAGNOSIS — G459 Transient cerebral ischemic attack, unspecified: Secondary | ICD-10-CM | POA: Diagnosis not present

## 2019-05-09 DIAGNOSIS — Z4682 Encounter for fitting and adjustment of non-vascular catheter: Secondary | ICD-10-CM | POA: Diagnosis not present

## 2019-05-09 DIAGNOSIS — R2243 Localized swelling, mass and lump, lower limb, bilateral: Secondary | ICD-10-CM | POA: Insufficient documentation

## 2019-05-09 DIAGNOSIS — I5033 Acute on chronic diastolic (congestive) heart failure: Secondary | ICD-10-CM | POA: Diagnosis present

## 2019-05-09 DIAGNOSIS — I480 Paroxysmal atrial fibrillation: Secondary | ICD-10-CM | POA: Diagnosis not present

## 2019-05-09 DIAGNOSIS — M797 Fibromyalgia: Secondary | ICD-10-CM | POA: Insufficient documentation

## 2019-05-09 DIAGNOSIS — E1122 Type 2 diabetes mellitus with diabetic chronic kidney disease: Secondary | ICD-10-CM | POA: Diagnosis present

## 2019-05-09 DIAGNOSIS — I5032 Chronic diastolic (congestive) heart failure: Secondary | ICD-10-CM | POA: Diagnosis not present

## 2019-05-09 DIAGNOSIS — E1169 Type 2 diabetes mellitus with other specified complication: Secondary | ICD-10-CM | POA: Diagnosis present

## 2019-05-09 DIAGNOSIS — Z9109 Other allergy status, other than to drugs and biological substances: Secondary | ICD-10-CM | POA: Diagnosis not present

## 2019-05-09 DIAGNOSIS — I4891 Unspecified atrial fibrillation: Secondary | ICD-10-CM | POA: Diagnosis not present

## 2019-05-09 DIAGNOSIS — Z951 Presence of aortocoronary bypass graft: Secondary | ICD-10-CM | POA: Insufficient documentation

## 2019-05-09 DIAGNOSIS — I252 Old myocardial infarction: Secondary | ICD-10-CM | POA: Insufficient documentation

## 2019-05-09 DIAGNOSIS — J449 Chronic obstructive pulmonary disease, unspecified: Secondary | ICD-10-CM | POA: Diagnosis present

## 2019-05-09 DIAGNOSIS — N184 Chronic kidney disease, stage 4 (severe): Secondary | ICD-10-CM | POA: Diagnosis present

## 2019-05-09 DIAGNOSIS — G609 Hereditary and idiopathic neuropathy, unspecified: Secondary | ICD-10-CM | POA: Diagnosis not present

## 2019-05-09 DIAGNOSIS — Z8249 Family history of ischemic heart disease and other diseases of the circulatory system: Secondary | ICD-10-CM | POA: Insufficient documentation

## 2019-05-09 DIAGNOSIS — Z833 Family history of diabetes mellitus: Secondary | ICD-10-CM | POA: Insufficient documentation

## 2019-05-09 DIAGNOSIS — Z8679 Personal history of other diseases of the circulatory system: Secondary | ICD-10-CM | POA: Diagnosis not present

## 2019-05-09 DIAGNOSIS — Z87891 Personal history of nicotine dependence: Secondary | ICD-10-CM | POA: Insufficient documentation

## 2019-05-09 DIAGNOSIS — Z452 Encounter for adjustment and management of vascular access device: Secondary | ICD-10-CM | POA: Diagnosis not present

## 2019-05-09 DIAGNOSIS — Z79899 Other long term (current) drug therapy: Secondary | ICD-10-CM | POA: Insufficient documentation

## 2019-05-09 DIAGNOSIS — Z7901 Long term (current) use of anticoagulants: Secondary | ICD-10-CM | POA: Insufficient documentation

## 2019-05-09 DIAGNOSIS — Z66 Do not resuscitate: Secondary | ICD-10-CM | POA: Diagnosis present

## 2019-05-09 DIAGNOSIS — Z6841 Body Mass Index (BMI) 40.0 and over, adult: Secondary | ICD-10-CM | POA: Diagnosis not present

## 2019-05-09 DIAGNOSIS — J810 Acute pulmonary edema: Secondary | ICD-10-CM | POA: Diagnosis not present

## 2019-05-09 DIAGNOSIS — R1310 Dysphagia, unspecified: Secondary | ICD-10-CM | POA: Diagnosis present

## 2019-05-09 DIAGNOSIS — R2689 Other abnormalities of gait and mobility: Secondary | ICD-10-CM | POA: Diagnosis not present

## 2019-05-09 DIAGNOSIS — E785 Hyperlipidemia, unspecified: Secondary | ICD-10-CM | POA: Diagnosis not present

## 2019-05-09 DIAGNOSIS — Z6832 Body mass index (BMI) 32.0-32.9, adult: Secondary | ICD-10-CM | POA: Diagnosis not present

## 2019-05-09 MED ORDER — TORSEMIDE 20 MG PO TABS: 80.0000 mg | ORAL_TABLET | Freq: Two times a day (BID) | ORAL | 3 refills | Status: AC

## 2019-05-09 NOTE — Progress Notes (Signed)
PCP: Dr Gaye Alken  Primary Cardiologist: Dr Aundra Dubin   HPI: 58 yo with history of CAD s/p CABG, chronic diastolic CHF, atrial fibrillation, obesity, prior CVA s/p acoustic neuroma surgery, HTN,  diabetes, and PAF.   He had a single vessel CABG with LIMA-LAD in 2009.  In 6/18, he was in the hospital with diastolic CHF and atrial fibrillation with RVR.  He was started on Eliquis that time but not cardioverted.  After leaving the hospital, he did not continue apixaban and did not have cardiology followup.   Admitted 04/30/2018  with increased dyspnea and mental status changes. He was thought to have acute metobolic encephalopathy with hypercapneic respiratory failure.  Creatinine initially 4.19, has decreased to 2.15.  Last prior creatinine was 1.4 in 6/18.  He was on Bipap initially. He has been in atrial fibrillation with RVR, was started on Coreg but HR still running high. ECHO was nondiagnostic so TEE completed with cardioversion. He was placed amiodarone. Diuresed with IV lasix and later transitioned to torsemide 40 mg twice a day. He was discharged to [facility].   Admitted 04/13/19 with A/C respiratory failure. Intubated . Placed empiric antibiotics and IV lasix.  He did not tolerate Bipap. He was able to wean his oxygen to 3 liters. Palliative Care consulted he declined hospice/palliative care.    Today she returns for HF follow up.  Had a fall earlier today trying to get in his car. EMS called to help him up. He did not hit his head. Overall feeling fair. A couple of days ago he developed R thigh mass. Says he can push the area back up. SOB with exertion. Denies PND/Orthopnea. Remains on home oxygen. Appetite ok. Eating 3-5 oranges a day.  No fever or chills. Unable to weigh. Can only take a step or 2 but needs assistance.  Taking all medications provided by his wife. Marland KitchenHe declines [facility] HF services. He declined [facility] but his wife wants him to continue [facility] services.  Review of  systems complete and found to be negative unless listed in HPI.   SH:  Social History   Socioeconomic History  . Marital status: Married    Spouse name: Not on file  . Number of children: Not on file  . Years of education: Not on file  . Highest education level: Not on file  Occupational History  . Not on file  Social Needs  . Financial resource strain: Not on file  . Food insecurity    Worry: Not on file    Inability: Not on file  . Transportation needs    Medical: Not on file    Non-medical: Not on file  Tobacco Use  . Smoking status: Former Smoker    Packs/day: 0.25    Years: 20.00    Pack years: 5.00    Types: Cigarettes  . Smokeless tobacco: Current User  . Tobacco comment: Rare tobacco.  "[name]".  Substance and Sexual Activity  . Alcohol use: No    Alcohol/week: 0.0 standard drinks  . Drug use: No  . Sexual activity: Yes  Lifestyle  . Physical activity    Days per week: Not on file    Minutes per session: Not on file  . Stress: Not on file  Relationships  . Social Herbalist on phone: Not on file    Gets together: Not on file    Attends religious service: Not on file    Active  member of club or organization: Not on file    Attends meetings of clubs or organizations: Not on file    Relationship status: Not on file  . Intimate partner violence    Fear of current or ex partner: Not on file    Emotionally abused: Not on file    Physically abused: Not on file    Forced sexual activity: Not on file  Other Topics Concern  . Not on file  Social History Narrative  . Not on file    FH:  Family History  Problem Relation Age of Onset  . CAD Father   . Hypertension Mother   . Cancer Other        multiple relatives  . Diabetes Maternal Grandmother   . Diabetes Maternal Aunt   . Leukemia Cousin   . Leukemia Maternal Grandfather     Past Medical History:  Diagnosis Date  . Brain tumor ([place])    Takes Topamax so patient will not have  headaches  . Bruises easily   . CAD (coronary artery disease)    Cath 09, 99% LAD  . Cardiomyopathy, ischemic    Ischemic  . CHF (congestive heart failure) ([place])   . Cyst near coccyx   . Cyst near tailbone   . Diabetes mellitus without complication (HCC)    borderline  . Edema of both legs    Takes Lasix  . Fibromyalgia   . Gout   . Headache(784.0)    with lights  . Hypertension    dr Percival Spanish  . Kidney stones   . Myocardial infarction ([place])   . Obesity   . Pneumonia    hx of  . Shortness of breath   . Stroke [facility])    Affected vision, walking, and facial drooping on right face    Current Outpatient Medications  Medication Sig Dispense Refill  . albuterol (VENTOLIN HFA) 108 (90 Base) MCG/ACT inhaler Inhale 2 puffs into the lungs every 6 (six) hours as needed for wheezing or shortness of breath. 18 g 5  . Amino Acids-Protein Hydrolys (FEEDING SUPPLEMENT, PRO-STAT SUGAR FREE 64,) LIQD Take 30 mLs by mouth 2 (two) times daily. 887 mL 0  . amiodarone (PACERONE) 100 MG tablet Take 1 tablet (100 mg total) by mouth daily. 30 tablet 11  . apixaban (ELIQUIS) 5 MG TABS tablet Take 1 tablet (5 mg total) by mouth 2 (two) times daily. 60 tablet 11  . artificial tears (LACRILUBE) OINT ophthalmic ointment Place 1 application into both eyes at bedtime as needed for dry eyes.     . Blood Glucose Monitoring Suppl (ONE TOUCH ULTRA MINI) w/Device KIT Use as directed twice a day 1 each 0  . carvedilol (COREG) 12.5 MG tablet Take 1 tablet (12.5 mg total) by mouth 2 (two) times daily with a meal. 60 tablet 11  . docusate sodium (COLACE) 100 MG capsule Take 100 mg by mouth at bedtime.    Marland Kitchen Hydroactive Dressings (DUODERM HYDROACTIVE) GEL duoderm or generic equivalent gel dressing. 4cmx4cm. Apply to wound q 3 days until healed. 5 g 2  . HYDROcodone-acetaminophen (NORCO) 10-325 MG tablet TAKE 1 TABLET BY MOUTH EVERY 6 HOURS AS NEEDED FOR PAIN MUST LAST 30 DAYS (Patient taking differently: Take 1 tablet  by mouth every 6 (six) hours as needed (pain). ) 120 tablet 0  . hydroxypropyl methylcellulose / hypromellose (ISOPTO TEARS / GONIOVISC) 2.5 % ophthalmic solution Place 1 drop into both eyes 3 (three) times daily as needed for  dry eyes.    . Insulin Pen Needle 31G X 8 MM MISC 1 Units by Does not apply route 2 (two) times a day. 100 each 5  . ipratropium (ATROVENT) 0.02 % nebulizer solution Take 2.5 mLs (0.5 mg total) by nebulization 2 (two) times daily. 150 mL 0  . isosorbide mononitrate (IMDUR) 30 MG 24 hr tablet Take 1 tablet (30 mg total) by mouth daily. (Patient taking differently: Take 30 mg by mouth at bedtime. ) 30 tablet 11  . Lancets (ONETOUCH ULTRASOFT) lancets Use as instructed 100 each 12  . LANTUS 100 UNIT/ML injection INJECT 45 UNITS INTO THE SKIN AT BEDTIME FOR 120 DOSES (Patient taking differently: Inject 45 Units into the skin at bedtime. ) 20 mL 3  . levalbuterol (XOPENEX) 0.63 MG/3ML nebulizer solution Take 3 mLs (0.63 mg total) by nebulization 2 (two) times daily. (Patient not taking: Reported on 04/30/2019) 180 mL 0  . Multiple Vitamin (MULTIVITAMIN WITH MINERALS) TABS tablet Take 1 tablet by mouth daily.    Marland Kitchen nystatin (MYCOSTATIN/NYSTOP) powder APPLY  1  GRAM TOPICALLY TWICE DAILY AS NEEDED Patient would like Rx delivered 180 g 3  . ONE TOUCH ULTRA TEST test strip Use as instructed twice a day 100 each 12  . potassium chloride SA (K-DUR) 20 MEQ tablet Take 2 tablets (40 mEq total) by mouth 2 (two) times daily. 120 tablet 11  . rosuvastatin (CRESTOR) 20 MG tablet Take 1 tablet (20 mg total) by mouth daily at 6 PM. 30 tablet 0  . tiZANidine (ZANAFLEX) 4 MG tablet TAKE 1 TABLET BY MOUTH FOUR TIMES DAILY AS NEEDED FOR MUSCLE SPASMS (Patient taking differently: Take 4 mg by mouth 4 (four) times daily as needed for muscle spasms. ) 360 tablet 2  . topiramate (TOPAMAX) 100 MG tablet Take 1 tablet (100 mg total) by mouth at bedtime. 90 tablet 5  . torsemide (DEMADEX) 20 MG tablet TAKE 3  TABLETS (60 MG TOTAL) BY MOUTH 2 TIMES DAILY. (Patient taking differently: Take 60 mg by mouth 2 (two) times daily. ) 180 tablet 11   No current facility-administered medications for this encounter.     Vitals:   05/09/19 1146  BP: 137/65  Pulse: 97  SpO2: 97%   Unable to stand for weight.  Wt Readings from Last 3 Encounters:  04/21/19 (!) 158.9 kg (350 lb 5 oz)  04/04/19 (!) 142.9 kg (315 lb)  03/24/19 (!) 168.9 kg (372 lb 5.7 oz)    PHYSICAL EXAM: General: Appears chronically ill. Arrived in wheel chair. SOB trying to stand. On oxygen.  HEENT: R facial droop . Slurred sppech  Neck: supple. no JVD. Carotids 2+ bilat; no bruits. No lymphadenopathy or thryomegaly appreciated. Cor: PMI nondisplaced. Regular rate & rhythm. No rubs, gallops or murmurs. Lungs: EW  Abdomen: soft, nontender, nondistended. No hepatosplenomegaly. No bruits or masses. Good bowel sounds. Extremities: no cyanosis, clubbing, rash, R and LLE 2+ edema. R upper L upper thigh mass like area.  Neuro: alert & orientedx3, cranial nerves grossly intact. moves all 4 extremities w/o difficulty. Affect pleasant  ASSESSMENT & PLAN: 1.  Chronic Diastolic CHF:  TEE 1/54 showed EF 55-60% with mildly dilated/mildly dysfunctional RV.  - NYHA III. Volume overloaded.  - Increase torsemide to 80 mg twice a day.  - I reviewed BMET from 04/23/19. This was stable. HH to check BMET next week.  2. PAF:  - S/P DC-CV 04/2018.  - Regular pulse.  - Continue amiodarone to 100 mg daily.  Check TSH, LFTs, CBC today.  - Continue eliquis 5 mg twice a day.  3. CKD stage 3: Check BMET  4. CAD: S/p LIMA-LAD in 2009.  -No s/s ischemia.  - No ASA given Eliquis use.  - Continue rosuvastatin 20 mg 5. H/O metabolic encephalopathy: -  Wearing O2 continuously. Refuses BiPAP. Not interested in sleep study. Sleep study was set up again in the hospital . 6.  HTN  Stable.  7. Obesity There is no height or weight on file to calculate BMI.  8. R  Upper /Lupper Thigh Mass- Dr Aundra Dubin and I examined him together. He recommended CT abd/pelvis with noncontrast. ? Hernia  Follow up in 3 months with Dr Aundra Dubin.  Amy Clegg NP-C  11:45 AM  Patient seen with NP, agree with the above note.   Patient has NYHA class III symptoms and appears volume overloaded on exam with edema and JVD, agree with increasing torsemide to 80 mg bid. Will need BMET next week.   Patient has bilateral upper thigh masses, R>L.  I am not sure if these are just fat pads with edema or there are inguinal hernias present.  I cannot reduce them but they are quite large.  I will arrange for CT abd/pelvis/upper thighs with oral contrast only to assess.   [name] 05/09/2019 12:49 PM

## 2019-05-09 NOTE — Patient Instructions (Signed)
INCREASE Torsemide to 80 mg (4 tabs) twice daily  Labs needed in one week, this will be done by your home health nurse with [facility]  Be sure to schedule a follow up with your primary care provider as soon as possible.   Your physician recommends that you schedule a follow-up appointment in: 3 months with Dr Aundra Dubin  Do the following things EVERYDAY: 1) Weigh yourself in the morning before breakfast. Write it down and keep it in a log. 2) Take your medicines as prescribed 3) Eat low salt foods-Limit salt (sodium) to 2000 mg per day.  4) Stay as active as you can everyday 5) Limit all fluids for the day to less than 2 liters   At the [facility], you and your health needs are our priority. As part of our continuing mission to provide you with exceptional heart care, we have created designated Provider Care Teams. These Care Teams include your primary Cardiologist (physician) and Advanced Practice Providers (APPs- Physician Assistants and Nurse Practitioners) who all work together to provide you with the care you need, when you need it.   You may see any of the following providers on your designated Care Team at your next follow up: Marland Kitchen Dr Glori Bickers . Dr [name] . Darrick Grinder, NP   Please be sure to bring in all your medications bottles to every appointment.

## 2019-05-09 NOTE — Addendum Note (Signed)
Encounter addended by: Kerry Dory, CMA on: 05/09/2019 1:02 PM  Actions taken: Diagnosis association updated, Order list changed

## 2019-05-12 ENCOUNTER — Emergency Department (HOSPITAL_COMMUNITY): Payer: HMO

## 2019-05-12 ENCOUNTER — Inpatient Hospital Stay (HOSPITAL_COMMUNITY): Payer: HMO

## 2019-05-12 ENCOUNTER — Inpatient Hospital Stay (HOSPITAL_COMMUNITY)
Admission: EM | Admit: 2019-05-12 | Discharge: 2019-06-05 | DRG: 208 | Disposition: A | Discharge status: Skilled Nursing Facility | Payer: HMO | Attending: Internal Medicine | Admitting: Internal Medicine

## 2019-05-12 DIAGNOSIS — G4733 Obstructive sleep apnea (adult) (pediatric): Secondary | ICD-10-CM

## 2019-05-12 DIAGNOSIS — R680 Hypothermia, not associated with low environmental temperature: Secondary | ICD-10-CM | POA: Diagnosis present

## 2019-05-12 DIAGNOSIS — Z6841 Body Mass Index (BMI) 40.0 and over, adult: Secondary | ICD-10-CM | POA: Diagnosis not present

## 2019-05-12 DIAGNOSIS — Z20828 Contact with and (suspected) exposure to other viral communicable diseases: Secondary | ICD-10-CM | POA: Diagnosis present

## 2019-05-12 DIAGNOSIS — N184 Chronic kidney disease, stage 4 (severe): Secondary | ICD-10-CM | POA: Diagnosis present

## 2019-05-12 DIAGNOSIS — I252 Old myocardial infarction: Secondary | ICD-10-CM

## 2019-05-12 DIAGNOSIS — D333 Benign neoplasm of cranial nerves: Secondary | ICD-10-CM | POA: Diagnosis present

## 2019-05-12 DIAGNOSIS — R1310 Dysphagia, unspecified: Secondary | ICD-10-CM | POA: Diagnosis present

## 2019-05-12 DIAGNOSIS — E875 Hyperkalemia: Secondary | ICD-10-CM | POA: Diagnosis present

## 2019-05-12 DIAGNOSIS — Z720 Tobacco use: Secondary | ICD-10-CM

## 2019-05-12 DIAGNOSIS — I959 Hypotension, unspecified: Secondary | ICD-10-CM | POA: Diagnosis present

## 2019-05-12 DIAGNOSIS — T502X5A Adverse effect of carbonic-anhydrase inhibitors, benzothiadiazides and other diuretics, initial encounter: Secondary | ICD-10-CM | POA: Diagnosis not present

## 2019-05-12 DIAGNOSIS — E876 Hypokalemia: Secondary | ICD-10-CM | POA: Diagnosis present

## 2019-05-12 DIAGNOSIS — Z6832 Body mass index (BMI) 32.0-32.9, adult: Secondary | ICD-10-CM | POA: Diagnosis not present

## 2019-05-12 DIAGNOSIS — J9622 Acute and chronic respiratory failure with hypercapnia: Secondary | ICD-10-CM | POA: Diagnosis present

## 2019-05-12 DIAGNOSIS — Z8249 Family history of ischemic heart disease and other diseases of the circulatory system: Secondary | ICD-10-CM

## 2019-05-12 DIAGNOSIS — Z8701 Personal history of pneumonia (recurrent): Secondary | ICD-10-CM

## 2019-05-12 DIAGNOSIS — N183 Chronic kidney disease, stage 3 unspecified: Secondary | ICD-10-CM | POA: Diagnosis present

## 2019-05-12 DIAGNOSIS — Z8679 Personal history of other diseases of the circulatory system: Secondary | ICD-10-CM

## 2019-05-12 DIAGNOSIS — Z9119 Patient's noncompliance with other medical treatment and regimen: Secondary | ICD-10-CM

## 2019-05-12 DIAGNOSIS — Z888 Allergy status to other drugs, medicaments and biological substances status: Secondary | ICD-10-CM

## 2019-05-12 DIAGNOSIS — R519 Headache, unspecified: Secondary | ICD-10-CM | POA: Diagnosis present

## 2019-05-12 DIAGNOSIS — Z79899 Other long term (current) drug therapy: Secondary | ICD-10-CM

## 2019-05-12 DIAGNOSIS — R079 Chest pain, unspecified: Secondary | ICD-10-CM | POA: Diagnosis not present

## 2019-05-12 DIAGNOSIS — Z515 Encounter for palliative care: Secondary | ICD-10-CM

## 2019-05-12 DIAGNOSIS — Z7189 Other specified counseling: Secondary | ICD-10-CM | POA: Diagnosis not present

## 2019-05-12 DIAGNOSIS — Z978 Presence of other specified devices: Secondary | ICD-10-CM

## 2019-05-12 DIAGNOSIS — I5031 Acute diastolic (congestive) heart failure: Secondary | ICD-10-CM

## 2019-05-12 DIAGNOSIS — Z87442 Personal history of urinary calculi: Secondary | ICD-10-CM

## 2019-05-12 DIAGNOSIS — E1165 Type 2 diabetes mellitus with hyperglycemia: Secondary | ICD-10-CM | POA: Diagnosis present

## 2019-05-12 DIAGNOSIS — M109 Gout, unspecified: Secondary | ICD-10-CM | POA: Diagnosis present

## 2019-05-12 DIAGNOSIS — E1169 Type 2 diabetes mellitus with other specified complication: Secondary | ICD-10-CM | POA: Diagnosis present

## 2019-05-12 DIAGNOSIS — J9621 Acute and chronic respiratory failure with hypoxia: Principal | ICD-10-CM | POA: Diagnosis present

## 2019-05-12 DIAGNOSIS — Z66 Do not resuscitate: Secondary | ICD-10-CM | POA: Diagnosis present

## 2019-05-12 DIAGNOSIS — G894 Chronic pain syndrome: Secondary | ICD-10-CM

## 2019-05-12 DIAGNOSIS — E1122 Type 2 diabetes mellitus with diabetic chronic kidney disease: Secondary | ICD-10-CM | POA: Diagnosis present

## 2019-05-12 DIAGNOSIS — I2510 Atherosclerotic heart disease of native coronary artery without angina pectoris: Secondary | ICD-10-CM | POA: Diagnosis present

## 2019-05-12 DIAGNOSIS — I130 Hypertensive heart and chronic kidney disease with heart failure and stage 1 through stage 4 chronic kidney disease, or unspecified chronic kidney disease: Secondary | ICD-10-CM | POA: Diagnosis present

## 2019-05-12 DIAGNOSIS — Z452 Encounter for adjustment and management of vascular access device: Secondary | ICD-10-CM

## 2019-05-12 DIAGNOSIS — J9602 Acute respiratory failure with hypercapnia: Secondary | ICD-10-CM | POA: Diagnosis present

## 2019-05-12 DIAGNOSIS — I69398 Other sequelae of cerebral infarction: Secondary | ICD-10-CM

## 2019-05-12 DIAGNOSIS — I5033 Acute on chronic diastolic (congestive) heart failure: Secondary | ICD-10-CM | POA: Diagnosis present

## 2019-05-12 DIAGNOSIS — Z9109 Other allergy status, other than to drugs and biological substances: Secondary | ICD-10-CM

## 2019-05-12 DIAGNOSIS — Z4659 Encounter for fitting and adjustment of other gastrointestinal appliance and device: Secondary | ICD-10-CM

## 2019-05-12 DIAGNOSIS — I69392 Facial weakness following cerebral infarction: Secondary | ICD-10-CM

## 2019-05-12 DIAGNOSIS — M797 Fibromyalgia: Secondary | ICD-10-CM | POA: Diagnosis present

## 2019-05-12 DIAGNOSIS — Z885 Allergy status to narcotic agent status: Secondary | ICD-10-CM

## 2019-05-12 DIAGNOSIS — Z9889 Other specified postprocedural states: Secondary | ICD-10-CM

## 2019-05-12 DIAGNOSIS — Z7901 Long term (current) use of anticoagulants: Secondary | ICD-10-CM

## 2019-05-12 DIAGNOSIS — Z794 Long term (current) use of insulin: Secondary | ICD-10-CM

## 2019-05-12 DIAGNOSIS — G510 Bell's palsy: Secondary | ICD-10-CM

## 2019-05-12 DIAGNOSIS — H539 Unspecified visual disturbance: Secondary | ICD-10-CM | POA: Diagnosis present

## 2019-05-12 DIAGNOSIS — Z833 Family history of diabetes mellitus: Secondary | ICD-10-CM

## 2019-05-12 DIAGNOSIS — I480 Paroxysmal atrial fibrillation: Secondary | ICD-10-CM | POA: Diagnosis present

## 2019-05-12 DIAGNOSIS — Z9114 Patient's other noncompliance with medication regimen: Secondary | ICD-10-CM

## 2019-05-12 DIAGNOSIS — J810 Acute pulmonary edema: Secondary | ICD-10-CM | POA: Diagnosis not present

## 2019-05-12 DIAGNOSIS — J449 Chronic obstructive pulmonary disease, unspecified: Secondary | ICD-10-CM | POA: Diagnosis present

## 2019-05-12 DIAGNOSIS — L89322 Pressure ulcer of left buttock, stage 2: Secondary | ICD-10-CM | POA: Diagnosis present

## 2019-05-12 DIAGNOSIS — D496 Neoplasm of unspecified behavior of brain: Secondary | ICD-10-CM | POA: Diagnosis present

## 2019-05-12 DIAGNOSIS — E878 Other disorders of electrolyte and fluid balance, not elsewhere classified: Secondary | ICD-10-CM | POA: Diagnosis not present

## 2019-05-12 DIAGNOSIS — Z951 Presence of aortocoronary bypass graft: Secondary | ICD-10-CM

## 2019-05-12 DIAGNOSIS — R2689 Other abnormalities of gait and mobility: Secondary | ICD-10-CM | POA: Diagnosis present

## 2019-05-12 DIAGNOSIS — G9340 Encephalopathy, unspecified: Secondary | ICD-10-CM | POA: Diagnosis not present

## 2019-05-12 DIAGNOSIS — F419 Anxiety disorder, unspecified: Secondary | ICD-10-CM | POA: Diagnosis present

## 2019-05-12 DIAGNOSIS — Z806 Family history of leukemia: Secondary | ICD-10-CM

## 2019-05-12 DIAGNOSIS — I255 Ischemic cardiomyopathy: Secondary | ICD-10-CM | POA: Diagnosis present

## 2019-05-12 LAB — POCT I-STAT 7, (LYTES, BLD GAS, ICA,H+H)
Acid-Base Excess: 5 mmol/L — ABNORMAL HIGH (ref 0.0–2.0)
Bicarbonate: 32.4 mmol/L — ABNORMAL HIGH (ref 20.0–28.0)
Calcium, Ion: 1.24 mmol/L (ref 1.15–1.40)
HCT: 37 % — ABNORMAL LOW (ref 39.0–52.0)
Hemoglobin: 12.6 g/dL — ABNORMAL LOW (ref 13.0–17.0)
O2 Saturation: 94 %
Patient temperature: 98.6
Potassium: 4.9 mmol/L (ref 3.5–5.1)
Sodium: 142 mmol/L (ref 135–145)
TCO2: 34 mmol/L — ABNORMAL HIGH (ref 22–32)
pCO2 arterial: 60.6 mmHg — ABNORMAL HIGH (ref 32.0–48.0)
pH, Arterial: 7.337 — ABNORMAL LOW (ref 7.350–7.450)
pO2, Arterial: 78 mmHg — ABNORMAL LOW (ref 83.0–108.0)

## 2019-05-12 LAB — URINALYSIS, ROUTINE W REFLEX MICROSCOPIC
Bilirubin Urine: NEGATIVE
Glucose, UA: NEGATIVE mg/dL
Ketones, ur: NEGATIVE mg/dL
Nitrite: NEGATIVE
Protein, ur: NEGATIVE mg/dL
Specific Gravity, Urine: 1.009 (ref 1.005–1.030)
pH: 6 (ref 5.0–8.0)

## 2019-05-12 LAB — CBC WITH DIFFERENTIAL/PLATELET
Abs Immature Granulocytes: 0.08 10*3/uL — ABNORMAL HIGH (ref 0.00–0.07)
Basophils Absolute: 0.1 10*3/uL (ref 0.0–0.1)
Basophils Relative: 1 %
Eosinophils Absolute: 0.8 10*3/uL — ABNORMAL HIGH (ref 0.0–0.5)
Eosinophils Relative: 11 %
HCT: 40.1 % (ref 39.0–52.0)
Hemoglobin: 11.3 g/dL — ABNORMAL LOW (ref 13.0–17.0)
Immature Granulocytes: 1 %
Lymphocytes Relative: 12 %
Lymphs Abs: 0.9 10*3/uL (ref 0.7–4.0)
MCH: 29.7 pg (ref 26.0–34.0)
MCHC: 28.2 g/dL — ABNORMAL LOW (ref 30.0–36.0)
MCV: 105.2 fL — ABNORMAL HIGH (ref 80.0–100.0)
Monocytes Absolute: 1.2 10*3/uL — ABNORMAL HIGH (ref 0.1–1.0)
Monocytes Relative: 15 %
Neutro Abs: 4.7 10*3/uL (ref 1.7–7.7)
Neutrophils Relative %: 60 %
Platelets: 184 10*3/uL (ref 150–400)
RBC: 3.81 MIL/uL — ABNORMAL LOW (ref 4.22–5.81)
RDW: 14.2 % (ref 11.5–15.5)
WBC: 7.7 10*3/uL (ref 4.0–10.5)
nRBC: 0 % (ref 0.0–0.2)

## 2019-05-12 LAB — COMPREHENSIVE METABOLIC PANEL
ALT: 25 U/L (ref 0–44)
AST: 35 U/L (ref 15–41)
Albumin: 3.1 g/dL — ABNORMAL LOW (ref 3.5–5.0)
Alkaline Phosphatase: 413 U/L — ABNORMAL HIGH (ref 38–126)
Anion gap: 9 (ref 5–15)
BUN: 37 mg/dL — ABNORMAL HIGH (ref 6–20)
CO2: 31 mmol/L (ref 22–32)
Calcium: 9 mg/dL (ref 8.9–10.3)
Chloride: 100 mmol/L (ref 98–111)
Creatinine, Ser: 2.74 mg/dL — ABNORMAL HIGH (ref 0.61–1.24)
GFR calc Af Amer: 28 mL/min — ABNORMAL LOW (ref >60)
GFR calc non Af Amer: 24 mL/min — ABNORMAL LOW (ref >60)
Glucose, Bld: 187 mg/dL — ABNORMAL HIGH (ref 70–99)
Potassium: 5.5 mmol/L — ABNORMAL HIGH (ref 3.5–5.1)
Sodium: 140 mmol/L (ref 135–145)
Total Bilirubin: 1.2 mg/dL (ref 0.3–1.2)
Total Protein: 7.1 g/dL (ref 6.5–8.1)

## 2019-05-12 LAB — MRSA PCR SCREENING: MRSA by PCR: NEGATIVE

## 2019-05-12 LAB — SARS CORONAVIRUS 2 BY RT PCR (HOSPITAL ORDER, PERFORMED IN ~~LOC~~ HOSPITAL LAB): SARS Coronavirus 2: NEGATIVE

## 2019-05-12 LAB — PROCALCITONIN: Procalcitonin: 0.18 ng/mL

## 2019-05-12 LAB — PROTIME-INR
INR: 1.5 — ABNORMAL HIGH (ref 0.8–1.2)
Prothrombin Time: 18.2 seconds — ABNORMAL HIGH (ref 11.4–15.2)

## 2019-05-12 LAB — APTT: aPTT: 51 seconds — ABNORMAL HIGH (ref 24–36)

## 2019-05-12 LAB — LACTIC ACID, PLASMA: Lactic Acid, Venous: 0.8 mmol/L (ref 0.5–1.9)

## 2019-05-12 LAB — BRAIN NATRIURETIC PEPTIDE: B Natriuretic Peptide: 198.7 pg/mL — ABNORMAL HIGH (ref 0.0–100.0)

## 2019-05-12 LAB — TROPONIN I (HIGH SENSITIVITY)
Troponin I (High Sensitivity): 15 ng/L (ref <18)
Troponin I (High Sensitivity): 36 ng/L — ABNORMAL HIGH (ref <18)

## 2019-05-12 LAB — POTASSIUM: Potassium: 4.7 mmol/L (ref 3.5–5.1)

## 2019-05-12 MED ORDER — APIXABAN 5 MG PO TABS
5.0000 mg | ORAL_TABLET | Freq: Two times a day (BID) | ORAL | Status: DC
  Administered 2019-05-12 – 2019-05-14 (×5): 5 mg via NASOGASTRIC
  Filled 2019-05-12 (×5): qty 1

## 2019-05-12 MED ORDER — PANTOPRAZOLE SODIUM 40 MG IV SOLR
40.0000 mg | Freq: Every day | INTRAVENOUS | Status: DC
  Administered 2019-05-12 – 2019-05-17 (×6): 40 mg via INTRAVENOUS
  Filled 2019-05-12 (×6): qty 40

## 2019-05-12 MED ORDER — MIDAZOLAM HCL 2 MG/2ML IJ SOLN
1.0000 mg | INTRAMUSCULAR | Status: DC | PRN
  Administered 2019-05-12: 13:00:00 1 mg via INTRAVENOUS
  Filled 2019-05-12 (×2): qty 2

## 2019-05-12 MED ORDER — CARVEDILOL 12.5 MG PO TABS
12.5000 mg | ORAL_TABLET | Freq: Two times a day (BID) | ORAL | Status: DC
  Filled 2019-05-12 (×2): qty 1

## 2019-05-12 MED ORDER — ORAL CARE MOUTH RINSE
15.0000 mL | OROMUCOSAL | Status: DC
  Administered 2019-05-12 – 2019-05-14 (×18): 15 mL via OROMUCOSAL

## 2019-05-12 MED ORDER — PROPOFOL 1000 MG/100ML IV EMUL
0.0000 ug/kg/min | INTRAVENOUS | Status: DC
  Administered 2019-05-12 (×2): 20 ug/kg/min via INTRAVENOUS
  Administered 2019-05-13: 10 ug/kg/min via INTRAVENOUS
  Administered 2019-05-13: 20 ug/kg/min via INTRAVENOUS
  Administered 2019-05-13: 30 ug/kg/min via INTRAVENOUS
  Administered 2019-05-13: 20 ug/kg/min via INTRAVENOUS
  Administered 2019-05-13: 06:00:00 30 ug/kg/min via INTRAVENOUS
  Administered 2019-05-14 (×2): 10 ug/kg/min via INTRAVENOUS
  Filled 2019-05-12 (×7): qty 100

## 2019-05-12 MED ORDER — FENTANYL 2500MCG IN NS 250ML (10MCG/ML) PREMIX INFUSION
0.0000 ug/h | INTRAVENOUS | Status: DC
  Administered 2019-05-12: 13:00:00 50 ug/h via INTRAVENOUS
  Administered 2019-05-13 – 2019-05-14 (×2): 100 ug/h via INTRAVENOUS
  Filled 2019-05-12 (×2): qty 250

## 2019-05-12 MED ORDER — SODIUM CHLORIDE 0.9 % IV SOLN
2.0000 g | Freq: Two times a day (BID) | INTRAVENOUS | Status: DC
  Administered 2019-05-12 – 2019-05-13 (×3): 2 g via INTRAVENOUS
  Filled 2019-05-12 (×4): qty 2

## 2019-05-12 MED ORDER — ROSUVASTATIN CALCIUM 20 MG PO TABS
20.0000 mg | ORAL_TABLET | Freq: Every day | ORAL | Status: DC
  Administered 2019-05-12 – 2019-05-13 (×2): 20 mg via NASOGASTRIC
  Filled 2019-05-12 (×2): qty 1

## 2019-05-12 MED ORDER — AMIODARONE HCL 100 MG PO TABS
100.0000 mg | ORAL_TABLET | Freq: Every day | ORAL | Status: DC
  Administered 2019-05-13 – 2019-05-14 (×2): 100 mg via NASOGASTRIC
  Filled 2019-05-12 (×2): qty 1

## 2019-05-12 MED ORDER — FENTANYL 2500MCG IN NS 250ML (10MCG/ML) PREMIX INFUSION
0.0000 ug/h | INTRAVENOUS | Status: DC
  Filled 2019-05-12: qty 250

## 2019-05-12 MED ORDER — SODIUM CHLORIDE 0.9 % IV SOLN
2.0000 g | Freq: Once | INTRAVENOUS | Status: AC
  Administered 2019-05-12: 12:00:00 2 g via INTRAVENOUS
  Filled 2019-05-12: qty 2

## 2019-05-12 MED ORDER — FUROSEMIDE 10 MG/ML IJ SOLN
60.0000 mg | Freq: Two times a day (BID) | INTRAMUSCULAR | Status: AC
  Administered 2019-05-12 – 2019-05-14 (×4): 60 mg via INTRAVENOUS
  Filled 2019-05-12 (×4): qty 6

## 2019-05-12 MED ORDER — FENTANYL BOLUS VIA INFUSION
50.0000 ug | INTRAVENOUS | Status: DC | PRN
  Administered 2019-05-13: 06:00:00 50 ug via INTRAVENOUS
  Filled 2019-05-12: qty 50

## 2019-05-12 MED ORDER — IPRATROPIUM-ALBUTEROL 0.5-2.5 (3) MG/3ML IN SOLN
3.0000 mL | Freq: Once | RESPIRATORY_TRACT | Status: AC
  Administered 2019-05-12: 11:00:00 3 mL via RESPIRATORY_TRACT

## 2019-05-12 MED ORDER — ALBUTEROL SULFATE (2.5 MG/3ML) 0.083% IN NEBU
INHALATION_SOLUTION | RESPIRATORY_TRACT | Status: AC
  Administered 2019-05-12: 2.5 mg
  Filled 2019-05-12: qty 3

## 2019-05-12 MED ORDER — INSULIN ASPART 100 UNIT/ML ~~LOC~~ SOLN
0.0000 [IU] | SUBCUTANEOUS | Status: DC
  Administered 2019-05-12: 2 [IU] via SUBCUTANEOUS
  Administered 2019-05-14: 3 [IU] via SUBCUTANEOUS
  Administered 2019-05-14: 5 [IU] via SUBCUTANEOUS
  Administered 2019-05-14 (×2): 3 [IU] via SUBCUTANEOUS
  Administered 2019-05-14: 2 [IU] via SUBCUTANEOUS
  Administered 2019-05-14 – 2019-05-15 (×3): 3 [IU] via SUBCUTANEOUS
  Administered 2019-05-15: 2 [IU] via SUBCUTANEOUS
  Administered 2019-05-16 (×2): 3 [IU] via SUBCUTANEOUS
  Administered 2019-05-16: 2 [IU] via SUBCUTANEOUS
  Administered 2019-05-16: 3 [IU] via SUBCUTANEOUS
  Administered 2019-05-16 – 2019-05-17 (×3): 2 [IU] via SUBCUTANEOUS
  Administered 2019-05-17: 3 [IU] via SUBCUTANEOUS
  Administered 2019-05-17: 2 [IU] via SUBCUTANEOUS
  Administered 2019-05-17 – 2019-05-18 (×5): 3 [IU] via SUBCUTANEOUS
  Administered 2019-05-18 (×2): 2 [IU] via SUBCUTANEOUS
  Administered 2019-05-18: 13:00:00 3 [IU] via SUBCUTANEOUS
  Administered 2019-05-18: 5 [IU] via SUBCUTANEOUS
  Administered 2019-05-19: 04:00:00 2 [IU] via SUBCUTANEOUS
  Administered 2019-05-19 (×2): 3 [IU] via SUBCUTANEOUS
  Administered 2019-05-19: 5 [IU] via SUBCUTANEOUS
  Administered 2019-05-19 (×2): 3 [IU] via SUBCUTANEOUS
  Administered 2019-05-20: 2 [IU] via SUBCUTANEOUS
  Administered 2019-05-20 (×2): 5 [IU] via SUBCUTANEOUS
  Administered 2019-05-20 – 2019-05-21 (×4): 3 [IU] via SUBCUTANEOUS
  Administered 2019-05-21 (×2): 5 [IU] via SUBCUTANEOUS
  Administered 2019-05-21: 08:00:00 2 [IU] via SUBCUTANEOUS
  Administered 2019-05-21: 5 [IU] via SUBCUTANEOUS
  Administered 2019-05-21 – 2019-05-22 (×2): 3 [IU] via SUBCUTANEOUS
  Administered 2019-05-22: 5 [IU] via SUBCUTANEOUS
  Administered 2019-05-22: 13:00:00 8 [IU] via SUBCUTANEOUS
  Administered 2019-05-22 (×2): 3 [IU] via SUBCUTANEOUS
  Administered 2019-05-22: 17:00:00 5 [IU] via SUBCUTANEOUS
  Administered 2019-05-22: 3 [IU] via SUBCUTANEOUS
  Administered 2019-05-23: 2 [IU] via SUBCUTANEOUS
  Administered 2019-05-23: 3 [IU] via SUBCUTANEOUS
  Administered 2019-05-23: 8 [IU] via SUBCUTANEOUS
  Administered 2019-05-23: 10:00:00 5 [IU] via SUBCUTANEOUS
  Administered 2019-05-23 – 2019-05-24 (×3): 3 [IU] via SUBCUTANEOUS
  Administered 2019-05-24: 8 [IU] via SUBCUTANEOUS
  Administered 2019-05-24 (×2): 3 [IU] via SUBCUTANEOUS
  Administered 2019-05-24: 5 [IU] via SUBCUTANEOUS
  Administered 2019-05-24 – 2019-05-25 (×2): 3 [IU] via SUBCUTANEOUS
  Administered 2019-05-25: 5 [IU] via SUBCUTANEOUS
  Administered 2019-05-25: 05:00:00 3 [IU] via SUBCUTANEOUS
  Administered 2019-05-25: 22:00:00 5 [IU] via SUBCUTANEOUS
  Administered 2019-05-25 – 2019-05-26 (×2): 3 [IU] via SUBCUTANEOUS
  Administered 2019-05-26: 5 [IU] via SUBCUTANEOUS
  Administered 2019-05-26: 8 [IU] via SUBCUTANEOUS
  Administered 2019-05-26: 3 [IU] via SUBCUTANEOUS
  Administered 2019-05-26: 13:00:00 5 [IU] via SUBCUTANEOUS
  Administered 2019-05-26: 3 [IU] via SUBCUTANEOUS
  Administered 2019-05-27: 17:00:00 8 [IU] via SUBCUTANEOUS
  Administered 2019-05-27: 13:00:00 11 [IU] via SUBCUTANEOUS
  Administered 2019-05-27 (×2): 3 [IU] via SUBCUTANEOUS
  Administered 2019-05-27: 09:00:00 8 [IU] via SUBCUTANEOUS
  Administered 2019-05-27: 3 [IU] via SUBCUTANEOUS
  Administered 2019-05-28: 15 [IU] via SUBCUTANEOUS
  Administered 2019-05-28: 11:00:00 5 [IU] via SUBCUTANEOUS
  Administered 2019-05-28 (×3): 3 [IU] via SUBCUTANEOUS
  Administered 2019-05-28: 01:00:00 8 [IU] via SUBCUTANEOUS
  Administered 2019-05-29: 13:00:00 3 [IU] via SUBCUTANEOUS
  Administered 2019-05-29: 5 [IU] via SUBCUTANEOUS
  Administered 2019-05-29 – 2019-05-31 (×13): 3 [IU] via SUBCUTANEOUS
  Administered 2019-05-31: 5 [IU] via SUBCUTANEOUS
  Administered 2019-05-31 (×3): 3 [IU] via SUBCUTANEOUS
  Administered 2019-06-01: 8 [IU] via SUBCUTANEOUS
  Administered 2019-06-01: 3 [IU] via SUBCUTANEOUS
  Administered 2019-06-01 (×3): 5 [IU] via SUBCUTANEOUS
  Administered 2019-06-02: 10 [IU] via SUBCUTANEOUS
  Administered 2019-06-02: 5 [IU] via SUBCUTANEOUS
  Administered 2019-06-02: 3 [IU] via SUBCUTANEOUS
  Administered 2019-06-02: 8 [IU] via SUBCUTANEOUS
  Administered 2019-06-02 (×2): 3 [IU] via SUBCUTANEOUS
  Administered 2019-06-02 – 2019-06-03 (×2): 5 [IU] via SUBCUTANEOUS
  Administered 2019-06-03: 3 [IU] via SUBCUTANEOUS
  Administered 2019-06-03 (×2): 5 [IU] via SUBCUTANEOUS
  Administered 2019-06-03: 8 [IU] via SUBCUTANEOUS
  Administered 2019-06-03 – 2019-06-04 (×3): 3 [IU] via SUBCUTANEOUS
  Administered 2019-06-04: 5 [IU] via SUBCUTANEOUS
  Administered 2019-06-04: 3 [IU] via SUBCUTANEOUS
  Administered 2019-06-04 – 2019-06-05 (×4): 5 [IU] via SUBCUTANEOUS
  Administered 2019-06-05: 2 [IU] via SUBCUTANEOUS

## 2019-05-12 MED ORDER — IPRATROPIUM-ALBUTEROL 0.5-2.5 (3) MG/3ML IN SOLN
RESPIRATORY_TRACT | Status: AC
  Administered 2019-05-12: 3 mL via RESPIRATORY_TRACT
  Filled 2019-05-12: qty 3

## 2019-05-12 MED ORDER — CHLORHEXIDINE GLUCONATE 0.12% ORAL RINSE (MEDLINE KIT)
15.0000 mL | Freq: Two times a day (BID) | OROMUCOSAL | Status: DC
  Administered 2019-05-12 – 2019-05-13 (×4): 15 mL via OROMUCOSAL

## 2019-05-12 MED ORDER — TOPIRAMATE 100 MG PO TABS
100.0000 mg | ORAL_TABLET | Freq: Every day | ORAL | Status: DC
  Administered 2019-05-12 – 2019-05-13 (×2): 100 mg via NASOGASTRIC
  Filled 2019-05-12 (×3): qty 1

## 2019-05-12 MED ORDER — FENTANYL CITRATE (PF) 100 MCG/2ML IJ SOLN: 50.0000 ug | Freq: Once | INTRAMUSCULAR | Status: DC

## 2019-05-12 MED ORDER — FUROSEMIDE 10 MG/ML IJ SOLN
80.0000 mg | Freq: Once | INTRAMUSCULAR | Status: AC
  Administered 2019-05-12: 80 mg via INTRAVENOUS
  Filled 2019-05-12: qty 8

## 2019-05-12 MED ORDER — ACETAMINOPHEN 325 MG PO TABS
650.0000 mg | ORAL_TABLET | ORAL | Status: DC | PRN
  Administered 2019-05-12 – 2019-05-13 (×2): 650 mg via NASOGASTRIC
  Filled 2019-05-12 (×2): qty 2

## 2019-05-12 MED ORDER — CHLORHEXIDINE GLUCONATE CLOTH 2 % EX PADS
6.0000 | MEDICATED_PAD | Freq: Every day | CUTANEOUS | Status: DC
  Administered 2019-05-12 – 2019-06-05 (×22): 6 via TOPICAL

## 2019-05-12 NOTE — ED Triage Notes (Signed)
Pt arrives with [facility] from home c/o increased shortness of breath and possible CHF exacerbation. Initially pt was placed on 3L [place] with oxygen sat of 95%; EMS then placed pt on 10L non-rebreather with o2 sat of 100%. Pt reports lasix was increased to 40 mg; pt a&o x3.  EMS vitals:  BP 156/90 HR 77 CBG 137

## 2019-05-12 NOTE — H&P (Signed)
° °NAME:  Frank Cline Hollifield Jr., MRN:  [id number], DOB:  11/17/1960, LOS: 0 °ADMISSION DATE:  05/12/2019, CONSULTATION DATE:  05/12/2019 °REFERRING MD: Bero  CHIEF COMPLAINT: Respiratory failure ° °Brief History   °58 yr old morbidly obese M PMH  OHS, OSA, chronic respiratory failure with hypoxia and hypercapnia, on baseline 2 to 3 L/min home oxygen, noncompliant with CPAP IDDM, Afib, CAD, failed CABG in 2014, CVA, CKD stage 3-4, and diastolic HF known to heart failure and PCCM services presented to ED with 3 days of worsening shortness of breath, found to be hypercarbic and hypoxic requiring endotracheal intubation.  PCCM consulted for admission. ° °History of present illness   °58 yr old morbidly obese M PMH  OHS, OSA, chronic respiratory failure with hypoxia and hypercapnia, on baseline 2 to 3 L/min home oxygen, noncompliant with/refuses CPAP and has declined sleep study set up during previous in-pt stay,  IDDM, Afib, CAD, failed CABG in 2014, CVA, CKD stage 3-4, and diastolic HF known to heart failure service and PCCM presented to ED with 3 days of worsening shortness of breath, found to be hypercarbic and hypoxic. He was requiring 15 L oxygen on nonrebreather and became somnolent therefore required endotracheal intubation. Received empiric abx. PCCM consulted for admission. ° °W/u significant for CXR c/w pulmonary edema. ABG pH 7.337 pCO2 60.6 pO2 78 Bicarb 32.4 °K+ 5.5 Cre 2.74 (baseline from 2019-2020 1.62-peak of 4.19)  °BNP 198 (down from 301.3 04/13/2019) °WBC 7.7.  °Lactic acid 0.8 ° °Per EMR review: °Eval by HF for hospital F/U 9/18 °Increased torsemide to 80mg BID  ° °Admitted 04/30/2018  with increased dyspnea and mental status changes. He was thought to have acute metobolic encephalopathy with hypercapneic respiratory failure.  Creatinine initially 4.19, has decreased to 2.15.  He was on Bipap initially. He has been in atrial fibrillation with RVR, was started on Coreg but HR still running high. ECHO  was nondiagnostic so TEE completed with cardioversion. He was placed amiodarone. Diuresed with IV lasix and later transitioned to torsemide 40 mg twice a day. He was discharged to [facility].  °  °Admitted 04/13/19 with A/C respiratory failure. Intubated . Placed empiric antibiotics and IV lasix.  He did not tolerate Bipap. He was able to wean his oxygen to 3 liters. Palliative Care consulted he declined hospice/palliative care.   ° °Past Medical History  ° ° Brain tumor (HCC)    °  Takes Topamax so patient will not have headaches  °• Bruises easily    °• CAD (coronary artery disease)    °  Cath 09, 99% LAD  °• Cardiomyopathy, ischemic    °  Ischemic  °• CHF (congestive heart failure) (HCC)    °• Cyst near coccyx    °• Cyst near tailbone    °• Diabetes mellitus without complication (HCC)    °  borderline  °• Edema of both legs    °  Takes Lasix  °• Fibromyalgia    °• Gout    °• Headache(784.0)    °  with lights  °• Hypertension    °  dr hochrein  °• Kidney stones    °• Myocardial infarction (HCC)    °• Obesity    °• Pneumonia    °  hx of  °• Shortness of breath    °• Stroke (HCC)    °  Affected vision, walking, and facial drooping on right face  ° ° ° °Significant Hospital Events   °9/21 admit ° °Consults:  °NA ° °Procedures:  °NA ° °Significant Diagnostic Tests:  °9/21: CXR °1. Interval endotracheal intubation, tube tip in appropriate °position over the mid trachea. °2. There   There is new, extensive bilateral irregular heterogeneous and interstitial opacity, consistent with edema or infection. 3. Cardiomegaly status post median sternotomy. .  02/22/2019 Echo  1. The left ventricle has normal systolic function, with an ejection fraction of 55-60%. The cavity size was mildly dilated. There is mildly increased left ventricular wall thickness.  2. Left atrial size was mildly dilated.  3. Extremely limited; definity used; normal LV systolic function; focal wall motion abnormality cannot be excluded; mild LVH; mild  LVE.  TEE 05/08/2018 showed EF 55-60% with mildly dilated/mildly dysfunctional RV  Micro Data:  SARS Coronavirus 2 negative  9/21 Blood>> 9/21 Urine>>  Antimicrobials:  Cefepime 9/21 Vancomycin 9/21  Interim history/subjective:  Intubated, sedated. Admitted.   Objective   Blood pressure (!) 115/45, pulse 86, temperature (!) 95.8 F (35.4 C), temperature source Axillary, resp. rate (!) 22, height 5' 11" (1.803 m), weight (!) 170 kg, SpO2 100 %.    Vent Mode: PRVC FiO2 (%):  [100 %] 100 % Set Rate:  [22 bmp] 22 bmp Vt Set:  [610 mL] 610 mL PEEP:  [5 cmH20] 5 cmH20 Plateau Pressure:  [28 cmH20] 28 cmH20  No intake or output data in the 24 hours ending 05/12/19 1225 Filed Weights   05/12/19 1134  Weight: (!) 170 kg    Examination: General: Morbidly obese, chronically ill appearing, biting on ETT HENT: Normocephalic, PERRL. Trace blood in nares. Moist mucus membranes Neck: Thick neck girth, unable to assess for JVD. Trachea midline.  CV: IRRR. Distant S1S2. No MRG. +2 radial pulses. Unable to palpate lower EXT pulses d/t edema  Lungs: BBS diminished at bases, FNL, symmetrical. Vent supported  ABD: Morbidly obese, large, rounded, +BS x4. SNT/ND. No obvious  masses, guarding or rigidity GU: Condom cath  EXT: Moves upper EXT spontaneously. +2 edema b/l lower EXT  Skin: PWD. RLE chronic wounds with serous drainage and bandaids over same  Neuro: arouses to verbal stimuli. Non focal. RASS -1    Resolved Hospital Problem list   NA  Assessment & Plan:  Acute on chronic hypercarbic and hypoxic respiratory failure likely secondary to acute decompensation of diastolic congestive heart failure and volume overload complicated by morbid obesity, OSA, OHS and noncompliance with NIPPV.   -Received empiric antibiotics in the ED. normal white count.  Mild hypothermia.  No lactic acidosis.  Chest x-ray more consistent with pulmonary edema Plan Ventilator support to prevent eminent  deterioration and further organ dysfunction from hypoxemia and hypercarbia.   Patient is at risk for sudden hypoxia, barotrauma and hemodynamic compromise.   Maintain SpO2 greater than or equal to 90%. Head of bed elevated 30 degrees. Plateau pressures less than 30 cm H20.  Follow chest x-ray, ABG.   SAT/SBT as tolerated. Bronchial hygiene. RT/bronchodilator protocol. Diurese Continue beta-blocker as able Check procalcitonin Hold off on antibiotics for now Follow-up culture sent from ED  History of chronic A. fib on anticoagulation Plan Continue Eliquis Continue amiodarone   Hyperkalemia, mild in setting of CKD stage III-IV.  Potassium 5.5. Plan Diurese Repeat K at 1500 today Follow BMP I/O   Chronic lower extremity wounds present on admission Plan Consult wound care  Best practice:  Diet: NPO  Pain/Anxiety/Delirium protocol (if indicated): fentanyl, propofol  VAP protocol (if indicated): yes DVT prophylaxis: Continue chronic Eliquis GI prophylaxis: PPI Glucose control: History of diabetes.  Sliding scale insulin for now. Mobility: Bedrest Code Status: FULL CODE Family Communication: Updated patient's wife by telephone. Disposition: Admit ICU  Labs  Recent Labs  °Lab 05/12/19 °1018  °WBC 7.7  °NEUTROABS 4.7  °HGB 11.3*  °HCT 40.1  °MCV 105.2*  °PLT 184  ° ° °Basic Metabolic Panel: °Recent Labs  °Lab 05/12/19 °1018  °NA 140  °K 5.5*  °CL 100  °CO2 31  °GLUCOSE 187*  °BUN 37*  °CREATININE 2.74*  °CALCIUM 9.0  ° °GFR: °Estimated Creatinine Clearance: 47.1 mL/min (A) (by C-G formula based on SCr of 2.74 mg/dL (H)). °Recent Labs  °Lab 05/12/19 °1018  °WBC 7.7  °LATICACIDVEN 0.8  ° ° °Liver Function Tests: °Recent Labs  °Lab 05/12/19 °1018  °AST 35  °ALT 25  °ALKPHOS 413*  °BILITOT 1.2  °PROT 7.1  °ALBUMIN 3.1*  ° °No results for input(s): LIPASE, AMYLASE in the last 168 hours. °No results for input(s): AMMONIA in the last 168 hours. ° °ABG °   °Component Value  Date/Time  ° PHART 7.503 (H) 04/16/2019 0354  ° PCO2ART 54.6 (H) 04/16/2019 0354  ° PO2ART 76.0 (L) 04/16/2019 0354  ° HCO3 42.6 (H) 04/16/2019 0354  ° TCO2 44 (H) 04/16/2019 0354  ° ACIDBASEDEF 1.0 02/16/2016 2031  ° O2SAT 95.0 04/16/2019 0354  °  ° °Coagulation Profile: °Recent Labs  °Lab 05/12/19 °1018  °INR 1.5*  ° ° °Cardiac Enzymes: °No results for input(s): CKTOTAL, CKMB, CKMBINDEX, TROPONINI in the last 168 hours. ° °HbA1C: °Hgb A1c MFr Bld  °Date/Time Value Ref Range Status  °04/14/2019 04:37 AM 7.8 (H) 4.8 - 5.6 % Final  °  Comment:  °  (NOTE) °Pre diabetes:          5.7%-6.4% °Diabetes:              >6.4% °Glycemic control for   <7.0% °adults with diabetes °  °03/19/2019 10:32 AM 8.8 (H) 4.6 - 6.5 % Final  °  Comment:  °  Glycemic Control Guidelines for People with Diabetes:Non Diabetic:  <6%Goal of Therapy: <7%Additional Action Suggested:  >8%   ° ° °CBG: °No results for input(s): GLUCAP in the last 168 hours. ° °Review of Systems: Obtained from patient's wife  °Review of Systems  °Constitutional: Negative.   °HENT: Negative.   °Eyes: Negative.   °Respiratory: Positive for shortness of breath.   °Cardiovascular: Positive for leg swelling.  °Gastrointestinal: Negative.   °Genitourinary: Negative.   °Musculoskeletal: Negative.   °Skin:  °     Chronic lower extremity wounds  °Neurological: Negative.   °Endo/Heme/Allergies: Negative.   °Psychiatric/Behavioral: Negative.   ° ° ° °Past Medical History  °He,  has a past medical history of Brain tumor (HCC), Bruises easily, CAD (coronary artery disease), Cardiomyopathy, ischemic, CHF (congestive heart failure) (HCC), Cyst near coccyx, Cyst near tailbone, Diabetes mellitus without complication (HCC), Edema of both legs, Fibromyalgia, Gout, Headache(784.0), Hypertension, Kidney stones, Myocardial infarction (HCC), Obesity, Pneumonia, Shortness of breath, and Stroke (HCC).  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  ° Procedure: ACOUSTIC NEUROMA RESECTION;  Surgeon: Sui W Teoh, MD;  Location: [facility];  Service: ENT;  Laterality: N/A;  °• ANAL FISTULECTOMY    °• CARDIOVERSION N/A 05/08/2018  ° Procedure: CARDIOVERSION;  Surgeon: McLean, Dalton S, MD;  Location: [facility];  Service: Cardiovascular;  Laterality: N/A;  °• CORONARY ARTERY BYPASS GRAFT    ° LIMA to LAD 2009  °• CRANIOTOMY Right 11/20/2012  ° Procedure: CRANIOTOMY REPAIR DURAL/CENTRAL SPINAL FLUID LEAK;  Surgeon: Kyle L Cabbell, MD;    Location: [facility];  Service: Neurosurgery;  Laterality: Right;  °• EYE SURGERY    ° to coorect lid and double vision  °• MEDIAN RECTUS REPAIR Right 02/16/2016  ° Procedure: RIGHT LATERAL RECTUS RESECTION; SUPERIOR RECTUS RESECTION RIGHT EYE; RIGHT SUPERIOR RECTUS RECESSION; LATERAL TARSAL STRIP RIGHT LOWER EYELID;  Surgeon: Michael Spencer, MD;  Location: [facility] OR;  Service: Ophthalmology;  Laterality: Right;  °• PLACEMENT OF LUMBAR DRAIN N/A 11/20/2012  ° Procedure: Attempted PLACEMENT OF LUMBAR DRAIN;  Surgeon: Kyle L Cabbell, MD;  Location: [facility];  Service: Neurosurgery;  Laterality: N/A;  °• RETROSIGMOID CRANIECTOMY FOR TUMOR RESECTION Right 10/11/2012  ° Procedure: RETROSIGMOID CRANIECTOMY FOR TUMOR RESECTION;  Surgeon: Kyle L Cabbell, MD;  Location: [facility];  Service: Neurosurgery;  Laterality: Right;  Craniotomy for acoustic neuroma  °• TEE WITHOUT CARDIOVERSION N/A 05/08/2018  ° Procedure: TRANSESOPHAGEAL ECHOCARDIOGRAM (TEE);  Surgeon: McLean, Dalton S, MD;  Location: [facility];  Service: Cardiovascular;  Laterality: N/A;  °• TRACHEOSTOMY TUBE PLACEMENT N/A 10/11/2012  ° Procedure: TRACHEOSTOMY;  Surgeon: Sui W Teoh, MD;  Location: [facility];  Service: ENT;  Laterality: N/A;  °• UMBILICAL HERNIA REPAIR    °  ° °Social History  ° reports that he has quit smoking. His smoking use included cigarettes. He has a 5.00 pack-year smoking history. He uses smokeless tobacco. He reports  that he does not drink alcohol or use drugs.  ° °Family History   °His family history includes CAD in his father; Cancer in an other family member; Diabetes in his maternal aunt and maternal grandmother; Hypertension in his mother; Leukemia in his cousin and maternal grandfather.  ° °Allergies °Allergies  °Allergen Reactions  °• Morphine And Related Anaphylaxis  °  "makes me stop breathing"  °• Novocain [Procaine] Anaphylaxis  °• Carbamazepine   °  shaking  °• Codeine Other (See Comments)  °  "makes heart race" per patient  °• Gabapentin Other (See Comments)  °  Blood in urine  °• Ivp Dye [Iodinated Diagnostic Agents]   °  Passing out  °• Other   °  Steroids makes hearts race  °• Prednisone   °  Heart racing  °  ° °Home Medications  °Prior to Admission medications   °Medication Sig Start Date End Date Taking? Authorizing Provider  °albuterol (VENTOLIN HFA) 108 (90 Base) MCG/ACT inhaler Inhale 2 puffs into the lungs every 6 (six) hours as needed for wheezing or shortness of breath. 03/14/19  Yes Koberlein, Junell C, MD  °amiodarone (PACERONE) 100 MG tablet Take 1 tablet (100 mg total) by mouth daily. 02/06/19  Yes Clegg, Amy D, NP  °apixaban (ELIQUIS) 5 MG TABS tablet Take 1 tablet (5 mg total) by mouth 2 (two) times daily. 02/06/19  Yes Clegg, Amy D, NP  °artificial tears (LACRILUBE) OINT ophthalmic ointment Place 1 application into both eyes at bedtime as needed for dry eyes.    Yes [provider]  °carvedilol (COREG) 12.5 MG tablet Take 1 tablet (12.5 mg total) by mouth 2 (two) times daily with a meal. 02/06/19  Yes Clegg, Amy D, NP  °docusate sodium (COLACE) 100 MG capsule Take 100 mg by mouth at bedtime.   Yes [provider]  °Hydroactive Dressings (DUODERM HYDROACTIVE) GEL duoderm or generic equivalent gel dressing. 4cmx4cm. Apply to wound q 3 days until healed. 09/13/18  Yes Koberlein, Junell C, MD  °HYDROcodone-acetaminophen (NORCO) 10-325 MG tablet TAKE 1 TABLET BY MOUTH EVERY 6 HOURS AS    NEEDED FOR PAIN MUST LAST 30 DAYS °Patient taking differently: Take 1 tablet by mouth every 6 (six) hours as needed (pain).  04/04/19  Yes Thomas, Eunice L, NP  °hydroxypropyl methylcellulose / hypromellose (ISOPTO TEARS / GONIOVISC) 2.5 % ophthalmic solution Place 1 drop into both eyes 3 (three) times daily as needed for dry eyes.   Yes [provider]  °ipratropium (ATROVENT) 0.02 % nebulizer solution Take 2.5 mLs (0.5 mg total) by nebulization 2 (two) times daily. 04/24/19  Yes Goodrich, Daniel P, MD  °isosorbide mononitrate (IMDUR) 30 MG 24 hr tablet Take 1 tablet (30 mg total) by mouth daily. °Patient taking differently: Take 30 mg by mouth at bedtime.  02/06/19  Yes Clegg, Amy D, NP  °LANTUS 100 UNIT/ML injection INJECT 45 UNITS INTO THE SKIN AT BEDTIME FOR 120 DOSES °Patient taking differently: Inject 45 Units into the skin at bedtime.  12/13/18  Yes Koberlein, Junell C, MD  °nystatin (MYCOSTATIN/NYSTOP) powder APPLY  1  GRAM TOPICALLY TWICE DAILY AS NEEDED °Patient would like Rx delivered °Patient taking differently: Apply 1 g topically as needed. Under stomach and groin 04/30/19  Yes Burchette, Bruce W, MD  °potassium chloride SA (K-DUR) 20 MEQ tablet Take 2 tablets (40 mEq total) by mouth 2 (two) times daily. 02/06/19  Yes Clegg, Amy D, NP  °rosuvastatin (CRESTOR) 20 MG tablet Take 1 tablet (20 mg total) by mouth daily at 6 PM. 03/25/19 05/12/19 Yes Powell, A Caldwell Jr., MD  °tiZANidine (ZANAFLEX) 4 MG tablet TAKE 1 TABLET BY MOUTH FOUR TIMES DAILY AS NEEDED FOR MUSCLE SPASMS °Patient taking differently: Take 4 mg by mouth 4 (four) times daily as needed for muscle spasms.  03/17/19  Yes Swartz, Zachary T, MD  °topiramate (TOPAMAX) 100 MG tablet Take 1 tablet (100 mg total) by mouth at bedtime. 10/09/18  Yes Swartz, Zachary T, MD  °torsemide (DEMADEX) 20 MG tablet Take 4 tablets (80 mg total) by mouth 2 (two) times daily. 05/09/19  Yes Clegg, Amy D, NP  °Blood Glucose Monitoring Suppl (ONE TOUCH ULTRA MINI)  w/Device KIT Use as directed twice a day 12/25/18   Koberlein, Junell C, MD  °Insulin Pen Needle 31G X 8 MM MISC 1 Units by Does not apply route 2 (two) times a day. 02/11/19   Koberlein, Junell C, MD  °Lancets (ONETOUCH ULTRASOFT) lancets Use as instructed 12/25/18   Koberlein, Junell C, MD  °levalbuterol (XOPENEX) 0.63 MG/3ML nebulizer solution Take 3 mLs (0.63 mg total) by nebulization 2 (two) times daily. °Patient not taking: Reported on 05/12/2019 04/24/19 05/24/19  Goodrich, Daniel P, MD  °levofloxacin (LEVAQUIN) 500 MG tablet Take 500 mg by mouth daily. 05/04/19   [provider]  °Multiple Vitamin (MULTIVITAMIN WITH MINERALS) TABS tablet Take 1 tablet by mouth daily. °Patient not taking: Reported on 05/12/2019 04/25/19   Goodrich, Daniel P, MD  °ONE TOUCH ULTRA TEST test strip Use as instructed twice a day 12/25/18   Koberlein, Junell C, MD  °  ° °  The patient is critically ill with acute on chronic hypercarbic and hypoxic respiratory failure. He requires ICU for high complexity decision making, titration of high alert medications, ventilator management, titration of oxygen and interpretation of advanced monitoring.  °  °I personally spent 45 minutes providing critical care services including personally reviewing test results, discussing care with nursing staff/other physicians and completing orders pertaining to this patient.  Time was exclusive to the patient and does not include time spent teaching or in procedures. ° °  Voice recognition software was used in the production of this record.  Errors in interpretation may have been inadvertently missed during review. ° ° , MSN, AGACNP  °Pager [phone] or if no answer [phone] °[facility] ° ° ° ° ° °

## 2019-05-12 NOTE — Discharge Instructions (Signed)

## 2019-05-12 NOTE — Procedures (Signed)
Intubation Procedure Note [name] [id number] June 21, 1961  Procedure: Intubation Indications: Airway protection and maintenance  Procedure Details Consent: Unable to obtain consent because of altered level of consciousness. Time Out: Verified patient identification, verified procedure, site/side was marked, verified correct patient position, special equipment/implants available, medications/allergies/relevent history reviewed, required imaging and test results available.  Performed  Maximum sterile technique was used including cap, gloves, gown, hand hygiene, mask and sheet.  MAC and 4    Evaluation Hemodynamic Status: BP stable throughout; O2 sats: stable throughout Patient's Current Condition: stable Complications: No apparent complications Patient did tolerate procedure well. Chest X-ray ordered to verify placement.  CXR: tube position acceptable.   [name] 05/12/2019

## 2019-05-12 NOTE — ED Notes (Signed)
30 Etomidate and 100 Rocuronium pushed into L hand IV per MD orders.

## 2019-05-12 NOTE — ED Notes (Signed)
PT intubated 23 at lip

## 2019-05-12 NOTE — ED Notes (Signed)
Report given to Erin, RN

## 2019-05-12 NOTE — ED Provider Notes (Signed)
[facility] Emergency Department Provider Note MRN:  [id number]  Arrival date & time: 05/12/19     Chief Complaint   Shortness of Breath   History of Present Illness   Frank Fagin. is a 58 y.o. year-old male with a history of CAD, diabetes, CHF presenting to the ED with chief complaint of shortness of breath.  3 days of worsening shortness of breath.  Patient also endorsing cough.  Unsure if having fever.  Denies chest pain.  I was unable to obtain an accurate HPI, PMH, or ROS due to the patient's altered mental status, respiratory failure.  Level 5 caveat.  Review of Systems  Positive for respiratory failure, altered mental status.  Patient's Health History    Past Medical History:  Diagnosis Date  . Brain tumor ([place])    Takes Topamax so patient will not have headaches  . Bruises easily   . CAD (coronary artery disease)    Cath 09, 99% LAD  . Cardiomyopathy, ischemic    Ischemic  . CHF (congestive heart failure) ([place])   . Cyst near coccyx   . Cyst near tailbone   . Diabetes mellitus without complication (HCC)    borderline  . Edema of both legs    Takes Lasix  . Fibromyalgia   . Gout   . Headache(784.0)    with lights  . Hypertension    dr Percival Spanish  . Kidney stones   . Myocardial infarction ([place])   . Obesity   . Pneumonia    hx of  . Shortness of breath   . Stroke [facility])    Affected vision, walking, and facial drooping on right face    Past Surgical History:  Procedure Laterality Date  . 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 ANAL FISTULECTOMY    . CARDIOVERSION N/A 05/08/2018   Procedure: CARDIOVERSION;  Surgeon: [name], MD;  Location: [facility] ENDOSCOPY;  Service: Cardiovascular;  Laterality: N/A;  . CORONARY ARTERY BYPASS GRAFT     LIMA to LAD 2009  . CRANIOTOMY Right 11/20/2012   Procedure: CRANIOTOMY REPAIR DURAL/CENTRAL SPINAL FLUID LEAK;  Surgeon: [name], MD;  Location: [facility] NEURO ORS;  Service: Neurosurgery;  Laterality: Right;  . EYE SURGERY     to coorect lid and double vision  . MEDIAN RECTUS REPAIR Right 02/16/2016   Procedure: RIGHT LATERAL RECTUS RESECTION; SUPERIOR RECTUS RESECTION RIGHT EYE; RIGHT SUPERIOR RECTUS RECESSION; LATERAL TARSAL STRIP RIGHT LOWER EYELID;  Surgeon: [name], MD;  Location: [place];  Service: Ophthalmology;  Laterality: Right;  . PLACEMENT OF LUMBAR DRAIN N/A 11/20/2012   Procedure: Attempted PLACEMENT OF LUMBAR DRAIN;  Surgeon: [name], MD;  Location: [facility] NEURO ORS;  Service: Neurosurgery;  Laterality: N/A;  . RETROSIGMOID CRANIECTOMY FOR TUMOR RESECTION Right 10/11/2012   Procedure: RETROSIGMOID CRANIECTOMY FOR TUMOR RESECTION;  Surgeon: [name], MD;  Location: [place] NEURO ORS;  Service: Neurosurgery;  Laterality: Right;  Craniotomy for acoustic neuroma  . TEE WITHOUT CARDIOVERSION N/A 05/08/2018   Procedure: TRANSESOPHAGEAL ECHOCARDIOGRAM (TEE);  Surgeon: [name], MD;  Location: [facility] ENDOSCOPY;  Service: Cardiovascular;  Laterality: N/A;  . TRACHEOSTOMY TUBE PLACEMENT N/A 10/11/2012   Procedure: TRACHEOSTOMY;  Surgeon: Ascencion Dike, MD;  Location: [facility] NEURO ORS;  Service: ENT;  Laterality: N/A;  . UMBILICAL HERNIA REPAIR  Family History  Problem Relation Age of Onset  . CAD Father   . Hypertension Mother   . Cancer Other        multiple relatives  . Diabetes Maternal Grandmother   . Diabetes Maternal Aunt   . Leukemia Cousin   . Leukemia Maternal Grandfather     Social History   Socioeconomic History  . Marital status: Married    Spouse name: Not on file  . Number of children: Not on file  . Years of education: Not on file  . Highest education level: Not on file  Occupational History  . Not on file  Social Needs  . Financial resource strain: Not on file  . Food insecurity    Worry: Not on  file    Inability: Not on file  . Transportation needs    Medical: Not on file    Non-medical: Not on file  Tobacco Use  . Smoking status: Former Smoker    Packs/day: 0.25    Years: 20.00    Pack years: 5.00    Types: Cigarettes  . Smokeless tobacco: Current User  . Tobacco comment: Rare tobacco.  "[name]".  Substance and Sexual Activity  . Alcohol use: No    Alcohol/week: 0.0 standard drinks  . Drug use: No  . Sexual activity: Yes  Lifestyle  . Physical activity    Days per week: Not on file    Minutes per session: Not on file  . Stress: Not on file  Relationships  . Social Herbalist on phone: Not on file    Gets together: Not on file    Attends religious service: Not on file    Active member of club or organization: Not on file    Attends meetings of clubs or organizations: Not on file    Relationship status: Not on file  . Intimate partner violence    Fear of current or ex partner: Not on file    Emotionally abused: Not on file    Physically abused: Not on file    Forced sexual activity: Not on file  Other Topics Concern  . Not on file  Social History Narrative  . Not on file     Physical Exam  Vital Signs and Nursing Notes reviewed Vitals:   05/12/19 1134 05/12/19 1145  BP:  (!) 115/45  Pulse:    Resp:  (!) 22  Temp:    SpO2: 100%     CONSTITUTIONAL: Ill-appearing, obese, in moderate respiratory distress NEURO: Somnolent, moving all extremities, wakes to voice EYES:  eyes equal and reactive ENT/NECK:  no LAD, no JVD CARDIO: Regular rate, well-perfused, normal S1 and S2 PULM: Scattered rhonchi, decreased breath sounds bilaterally, tachypneic GI/GU:  normal bowel sounds, non-distended, non-tender MSK/SPINE:  No gross deformities, no edema SKIN:  no rash, atraumatic PSYCH:  Appropriate speech and behavior  Diagnostic and Interventional Summary    EKG Interpretation  Date/Time:  Monday May 12 2019 09:54:37 EDT Ventricular  Rate:  71 PR Interval:    QRS Duration: 106 QT Interval:  385 QTC Calculation: 419 R Axis:   123 Text Interpretation:  Atrial fibrillation Right axis deviation Confirmed by Gerlene Fee [phone]) on 05/12/2019 10:41:32 AM      Labs Reviewed  CBC WITH DIFFERENTIAL/PLATELET - Abnormal; Notable for the following components:      Result Value   RBC 3.81 (*)    Hemoglobin 11.3 (*)    MCV 105.2 (*)  MCHC 28.2 (*)    Monocytes Absolute 1.2 (*)    Eosinophils Absolute 0.8 (*)    Abs Immature Granulocytes 0.08 (*)    All other components within normal limits  APTT - Abnormal; Notable for the following components:   aPTT 51 (*)    All other components within normal limits  PROTIME-INR - Abnormal; Notable for the following components:   Prothrombin Time 18.2 (*)    INR 1.5 (*)    All other components within normal limits  CULTURE, BLOOD (ROUTINE X 2)  CULTURE, BLOOD (ROUTINE X 2)  URINE CULTURE  SARS CORONAVIRUS 2 (HOSPITAL ORDER, [place] [facility])  LACTIC ACID, PLASMA  LACTIC ACID, PLASMA  COMPREHENSIVE METABOLIC PANEL  URINALYSIS, ROUTINE W REFLEX MICROSCOPIC  BRAIN NATRIURETIC PEPTIDE  I-STAT ARTERIAL BLOOD GAS, ED  TROPONIN I (HIGH SENSITIVITY)  TROPONIN I (HIGH SENSITIVITY)    DG Chest Port 1 View  Final Result      Medications  ceFEPIme (MAXIPIME) 2 g in sodium chloride 0.9 % 100 mL IVPB (2 g Intravenous New Bag/Given 05/12/19 1140)  fentaNYL 2554mcg in NS 236mL (23mcg/ml) infusion-PREMIX (has no administration in time range)  midazolam (VERSED) injection 1 mg (has no administration in time range)  ipratropium-albuterol (DUONEB) 0.5-2.5 (3) MG/3ML nebulizer solution 3 mL (3 mLs Nebulization Given 05/12/19 1127)     Procedure Name: Intubation Date/Time: 05/12/2019 12:03 PM Performed by: Maudie Flakes, MD Pre-anesthesia Checklist: Patient identified, Patient being monitored, Emergency Drugs available, Timeout performed and Suction available Oxygen  Delivery Method: Non-rebreather mask Preoxygenation: Pre-oxygenation with 100% oxygen Induction Type: Rapid sequence Ventilation: Mask ventilation without difficulty Laryngoscope Size: Glidescope and 4 Grade View: Grade I Tube size: 7.5 mm Number of attempts: 1 Airway Equipment and Method: Rigid stylet and Patient positioned with wedge pillow Placement Confirmation: ETT inserted through vocal cords under direct vision,  CO2 detector and Breath sounds checked- equal and bilateral Secured at: 23 cm Tube secured with: ETT holder Comments: Uncomplicated RSI using 30 mg etomidate and 100 mg rocuronium      Critical Care Critical Care Documentation Critical care time provided by me (excluding procedures): 36 minutes  Condition necessitating critical care: Hypercarbic respiratory failure, concern for sepsis of pulmonary origin  Components of critical care management: reviewing of prior records, laboratory and imaging interpretation, frequent re-examination and reassessment of vital signs, administration of IV antibiotics, discussion with consulting services, ventilatory management.    ED Course and Medical Decision Making  I have reviewed the triage vital signs and the nursing notes.  Pertinent labs & imaging results that were available during my care of the patient were reviewed by me and considered in my medical decision making (see below for details).  Concern for hypoxic and hypercarbic respiratory failure in the setting of CHF, also considering pneumonia, coronavirus.  Patient required intubation last month.  He is requiring 15 L on nonrebreather at this time, somnolent.  Suspecting need for definitive airway.  Respiratory therapy consulted for ABG to further evaluate.  Unfortunately not a good candidate for noninvasive positive pressure ventilation given his questionable coronavirus status.  Patient became more and more altered, arterial blood gas revealing pH of 7.1 with a PCO2 of  greater than 90.  Intubated as described above without complication.  To be admitted to critical care for further management.  Barth Kirks. Sedonia Small, MD [place] [email or web address]  Final Clinical Impressions(s) / ED Diagnoses     ICD-10-CM  1. Acute respiratory failure with hypercapnia ([place])  J96.02     ED Discharge Orders    None      Discharge Instructions Discussed with and Provided to Patient: Discharge Instructions   None       Maudie Flakes, MD 05/12/19 1206

## 2019-05-12 NOTE — Consult Note (Signed)
[facility] Nurse wound consult note Reason for Consult:Chronic skin changes to lower legs.  Blistering to bilateral anterior lower legs from edema.  Pitting edema to feet and lower legs.  Stage 2 sacral injury  Pressure and moisture  Wound type:Inflammatory from edema and chronic venous insufficiency   Stage 2 pressure and moisture  Pressure Injury POA: Yes Measurement: 2 cm x 2 cm ruptured blisters to lower legs 2 cmx 2 cm x 0.1 cm stage 2 to sacrum Wound ESP:QZRA and moist Drainage (amount, consistency, odor) minimal weeping  No odor Periwound:chronic skin changes to legs Dressing procedure/placement/frequency: Cleanse lower legs with soap and water and pat dry.  Apply Xeroform to nonintact lesions.  Wrap with kerlix from below toes to below knee and secure with ace wrap to bilateral lower legs.  Change M/W/F. Silicone foam to sacral wound.  Change every three days and PRn soilage.  Will not follow at this time.  Please re-consult if needed.  Domenic Moras MSN, RN, FNP-BC CWON Wound, Ostomy, Continence Nurse Pager [phone]

## 2019-05-13 ENCOUNTER — Inpatient Hospital Stay: Payer: Self-pay

## 2019-05-13 ENCOUNTER — Other Ambulatory Visit: Payer: Self-pay

## 2019-05-13 ENCOUNTER — Inpatient Hospital Stay (HOSPITAL_COMMUNITY): Payer: HMO

## 2019-05-13 DIAGNOSIS — G9340 Encephalopathy, unspecified: Secondary | ICD-10-CM

## 2019-05-13 DIAGNOSIS — Z7189 Other specified counseling: Secondary | ICD-10-CM

## 2019-05-13 DIAGNOSIS — G4733 Obstructive sleep apnea (adult) (pediatric): Secondary | ICD-10-CM

## 2019-05-13 LAB — COMPREHENSIVE METABOLIC PANEL
ALT: 21 U/L (ref 0–44)
AST: 29 U/L (ref 15–41)
Albumin: 2.8 g/dL — ABNORMAL LOW (ref 3.5–5.0)
Alkaline Phosphatase: 340 U/L — ABNORMAL HIGH (ref 38–126)
Anion gap: 13 (ref 5–15)
BUN: 36 mg/dL — ABNORMAL HIGH (ref 6–20)
CO2: 28 mmol/L (ref 22–32)
Calcium: 9.1 mg/dL (ref 8.9–10.3)
Chloride: 102 mmol/L (ref 98–111)
Creatinine, Ser: 2.87 mg/dL — ABNORMAL HIGH (ref 0.61–1.24)
GFR calc Af Amer: 27 mL/min — ABNORMAL LOW (ref >60)
GFR calc non Af Amer: 23 mL/min — ABNORMAL LOW (ref >60)
Glucose, Bld: 81 mg/dL (ref 70–99)
Potassium: 3.9 mmol/L (ref 3.5–5.1)
Sodium: 143 mmol/L (ref 135–145)
Total Bilirubin: 1.3 mg/dL — ABNORMAL HIGH (ref 0.3–1.2)
Total Protein: 6.4 g/dL — ABNORMAL LOW (ref 6.5–8.1)

## 2019-05-13 LAB — BLOOD CULTURE ID PANEL (REFLEXED)

## 2019-05-13 LAB — PHOSPHORUS: Phosphorus: 1.9 mg/dL — ABNORMAL LOW (ref 2.5–4.6)

## 2019-05-13 LAB — CBC
HCT: 34.8 % — ABNORMAL LOW (ref 39.0–52.0)
Hemoglobin: 10.8 g/dL — ABNORMAL LOW (ref 13.0–17.0)
MCH: 30.7 pg (ref 26.0–34.0)
MCHC: 31 g/dL (ref 30.0–36.0)
MCV: 98.9 fL (ref 80.0–100.0)
Platelets: 224 10*3/uL (ref 150–400)
RBC: 3.52 MIL/uL — ABNORMAL LOW (ref 4.22–5.81)
RDW: 14.4 % (ref 11.5–15.5)
WBC: 8.1 10*3/uL (ref 4.0–10.5)
nRBC: 0 % (ref 0.0–0.2)

## 2019-05-13 LAB — GLUCOSE, CAPILLARY
Glucose-Capillary: 132 mg/dL — ABNORMAL HIGH (ref 70–99)
Glucose-Capillary: 75 mg/dL (ref 70–99)
Glucose-Capillary: 78 mg/dL (ref 70–99)
Glucose-Capillary: 92 mg/dL (ref 70–99)
Glucose-Capillary: 100 mg/dL — ABNORMAL HIGH (ref 70–99)
Glucose-Capillary: 97 mg/dL (ref 70–99)
Glucose-Capillary: 79 mg/dL (ref 70–99)
Glucose-Capillary: 106 mg/dL — ABNORMAL HIGH (ref 70–99)

## 2019-05-13 LAB — TRIGLYCERIDES: Triglycerides: 146 mg/dL (ref <150)

## 2019-05-13 LAB — MAGNESIUM: Magnesium: 1.9 mg/dL (ref 1.7–2.4)

## 2019-05-13 MED ORDER — SODIUM CHLORIDE 0.9% FLUSH
10.0000 mL | INTRAVENOUS | Status: DC | PRN
  Administered 2019-05-17: 10 mL
  Administered 2019-05-19: 20 mL
  Filled 2019-05-13 (×2): qty 40

## 2019-05-13 MED ORDER — VITAL HIGH PROTEIN PO LIQD
1000.0000 mL | ORAL | Status: DC
  Administered 2019-05-13 – 2019-05-14 (×2): 1000 mL

## 2019-05-13 MED ORDER — PHENYLEPHRINE HCL-NACL 10-0.9 MG/250ML-% IV SOLN
INTRAVENOUS | Status: AC
  Filled 2019-05-13: qty 250

## 2019-05-13 MED ORDER — PRO-STAT SUGAR FREE PO LIQD
30.0000 mL | Freq: Two times a day (BID) | ORAL | Status: DC
  Administered 2019-05-13 – 2019-05-14 (×2): 30 mL
  Filled 2019-05-13 (×2): qty 30

## 2019-05-13 MED ORDER — PHENYLEPHRINE HCL-NACL 10-0.9 MG/250ML-% IV SOLN
0.0000 ug/min | INTRAVENOUS | Status: DC
  Administered 2019-05-13: 70 ug/min via INTRAVENOUS
  Administered 2019-05-13: 01:00:00 20 ug/min via INTRAVENOUS
  Administered 2019-05-13: 90 ug/min via INTRAVENOUS
  Administered 2019-05-13: 13:00:00 13.333 ug/min via INTRAVENOUS
  Filled 2019-05-13 (×5): qty 250

## 2019-05-13 MED ORDER — SODIUM CHLORIDE 0.9% FLUSH
10.0000 mL | Freq: Two times a day (BID) | INTRAVENOUS | Status: DC
  Administered 2019-05-13: 13:00:00 10 mL
  Administered 2019-05-14: 20 mL
  Administered 2019-05-15: 23:00:00 10 mL
  Administered 2019-05-15: 40 mL
  Administered 2019-05-16 – 2019-05-17 (×4): 10 mL
  Administered 2019-05-18: 20 mL
  Administered 2019-05-18 – 2019-06-02 (×7): 10 mL

## 2019-05-13 MED ORDER — PHENYLEPHRINE HCL-NACL 40-0.9 MG/250ML-% IV SOLN
0.0000 ug/min | INTRAVENOUS | Status: DC
  Administered 2019-05-13 – 2019-05-14 (×2): 70 ug/min via INTRAVENOUS
  Filled 2019-05-13 (×4): qty 250

## 2019-05-13 NOTE — Consult Note (Signed)
   [facility] CM Inpatient Consult   05/13/2019  [name]. 1961/07/03 [id number]   Status: Active, extreme high risk for unplanned readmissions  Patient is currently active with [facility] for chronic disease management services. Patient has been engaged by a [facility] Coordinator in the [facility].  Chart review reveals per progress notes from brief history notes as follows that includes but not limited to:  58 yr old morbidly obese M PMH  OHS, OSA, chronic respiratory failure with hypoxia and hypercapnia, on baseline 2 to 3 L/min home oxygen, noncompliant with/refuses CPAP and has declined sleep study set up during previous in-pt stay,  IDDM, Afib, CAD, failed CABG in 2014, CVA, CKD GYIRS8-5, and diastolic HF known to heart failure service and PCCM presented to ED with 3 days of worsening shortness of breath, found to be hypercarbic and hypoxic. He was requiring 15 L oxygen on nonrebreather and became somnolent therefore required endotracheal intubation. This is the patient's third admission and less than 30 days from previous admission.  A palliative consult was conducted at the last admission [please see notes from palliative consult].  Plan: Will follow for progress and update [facility] Care Coordinator of such.  For questions please contact:  Natividad Brood, RN BSN [facility] Liaison  [phone] business mobile phone Toll free office [phone]  Fax number: [phone] [email or web address]

## 2019-05-13 NOTE — Progress Notes (Addendum)
NAME:  [name]., MRN:  [id number], DOB:  10-Sep-1960, LOS: 1 ADMISSION DATE:  05/12/2019, CONSULTATION DATE:  05/12/2019 REFERRING MD: Sedonia Small  CHIEF COMPLAINT: Respiratory failure  Brief History   58 yr old morbidly obese M PMH OHS, OSA, chronic respiratory failure with hypoxia and hypercapnia, on baseline 2 to 3 L/min home oxygen, noncompliant with CPAP IDDM, Afib, CAD, failed CABG in 2014, CVA, CKD stage 3-4, and diastolic HF known to heart failure and PCCM services presented to ED with 3 days of worsening shortness of breath, found to be hypercarbic and hypoxic requiring endotracheal intubation.  PCCM consulted for admission.  History of present illness   58 yr old morbidly obese M PMH  OHS, OSA, chronic respiratory failure with hypoxia and hypercapnia, on baseline 2 to 3 L/min home oxygen, noncompliant with/refuses CPAP and has declined sleep study set up during previous in-pt stay,  IDDM, Afib, CAD, failed CABG in 2014, CVA, CKD stage 3-4, and diastolic HF known to heart failure service and PCCM presented to ED with 3 days of worsening shortness of breath, found to be hypercarbic and hypoxic. He was requiring 15 L oxygen on nonrebreather and became somnolent therefore required endotracheal intubation. Received empiric abx. PCCM consulted for admission.  W/u significant for CXR c/w pulmonary edema. ABG pH 7.337 pCO2 60.6 pO2 78 Bicarb 32.4 K+ 5.5 Cre 2.74 (baseline from 2019-2020 1.62-peak of 4.19)  BNP 198 (down from 301.3 04/13/2019) WBC 7.7.  Lactic acid 0.8  Per EMR review: Eval by HF for hospital F/U 9/18 Increased torsemide to 80mg  BID   Admitted 04/30/2018  with increased dyspnea and mental status changes. He was thought to have acute metobolic encephalopathy with hypercapneic respiratory failure. Creatinine initially 4.19, has decreased to 2.15. He was on Bipap initially. He has been in atrial fibrillation with RVR, was started on Coreg but HR still running high. ECHO  was nondiagnostic so TEE completed with cardioversion. He was placed amiodarone. Diuresed with IV lasix and later transitioned to torsemide 40 mg twice a day. He was discharged to [facility].   Admitted 04/13/19 with A/C respiratory failure. Intubated . Placed empiric antibiotics and IV lasix.  He did not tolerate Bipap. He was able to wean his oxygen to 3 liters. Palliative Care consulted he declined hospice/palliative care.    Past Medical History    Brain tumor [facility])    Takes Topamax so patient will not have headaches  . Bruises easily   . CAD (coronary artery disease)    Cath 09, 99% LAD  . Cardiomyopathy, ischemic    Ischemic  . CHF (congestive heart failure) ([place])   . Cyst near coccyx   . Cyst near tailbone   . Diabetes mellitus without complication (HCC)    borderline  . Edema of both legs    Takes Lasix  . Fibromyalgia   . Gout   . Headache(784.0)    with lights  . Hypertension    dr Percival Spanish  . Kidney stones   . Myocardial infarction ([place])   . Obesity   . Pneumonia    hx of  . Shortness of breath   . Stroke [facility])    Affected vision, walking, and facial drooping on right face     Significant Hospital Events   9/21 admit  Consults:  NA  Procedures:  NA  Significant Diagnostic Tests:  9/21: CXR 1. Interval endotracheal intubation, tube tip in appropriate position over the mid trachea. 2. There is  new, extensive bilateral irregular heterogeneous and interstitial opacity, consistent with edema or infection. 3. Cardiomegaly status post median sternotomy. 9/22: CXR Poor image quality Improved aeration and decreased edema.   02/22/2019 Echo  1. The left ventricle has normal systolic function, with an ejection fraction of 55-60%. The cavity size was mildly dilated. There is mildly increased left ventricular wall thickness.  2. Left atrial size was mildly dilated.  3. Extremely limited; definity used; normal LV systolic  function; focal wall motion abnormality cannot be excluded; mild LVH; mild LVE.  TEE 05/08/2018 showed EF 55-60% with mildly dilated/mildly dysfunctional RV  Micro Data:  SARS Coronavirus 2 negative  9/21 Blood>>NGTD 9/21 Urine>> 9/21 Sputum>> MRSA surveillance negative   Antimicrobials:  Cefepime 9/21>> Vancomycin 9/21 x 1 dose.   Interim history/subjective:  Required neo overnight with increased sedation d/t agitation. Low grade temp.   Objective   Blood pressure 112/60, pulse 85, temperature 100.1 F (37.8 C), temperature source Oral, resp. rate 20, height 5\' 11"  (1.803 m), weight (!) 173.3 kg, SpO2 99 %.    Vent Mode: PRVC FiO2 (%):  [40 %-100 %] 40 % Set Rate:  [22 bmp] 22 bmp Vt Set:  [600 mL-610 mL] 600 mL PEEP:  [5 cmH20-8 cmH20] 5 cmH20 Plateau Pressure:  [20 cmH20-37 cmH20] 20 cmH20   Intake/Output Summary (Last 24 hours) at 05/13/2019 0857 Last data filed at 05/13/2019 0800 Gross per 24 hour  Intake 1092.74 ml  Output 2525 ml  Net -1432.26 ml   Filed Weights   05/12/19 1134 05/13/19 0440  Weight: (!) 170 kg (!) 173.3 kg    Examination: General: Morbidly obese, chronically ill appearing HENT: Normocephalic, PERRL. Trace dried blood in nares. Moist mucus membranes Neck: Thick neck girth, unable to assess for JVD. Trachea midline.  CV: IRRR. Distant S1S2. No MRG. +2 radial pulses. Unable to palpate lower EXT pulses d/t edema  Lungs: BBS diminished at bases, FNL, symmetrical. Vent supported  ABD: Morbidly obese, large, rounded, +BS x4. SNT/ND. No obvious  masses, guarding or rigidity GU: Condom cath  EXT: Moves upper EXT spontaneously. +2 edema b/l lower EXT  Skin: PWD. B/L lower EXT ace wraps in place  Neuro: arouses to verbal stimuli. Non focal. RASS -1    Resolved Hospital Problem list   NA  Assessment & Plan:  Acute on chronic hypercarbic and hypoxic respiratory failure likely secondary to acute decompensation of diastolic congestive heart failure  and volume overload complicated by morbid obesity, OSA, OHS and noncompliance with NIPPV.  This is the patient's second intubation in a month for similar presentation.  Initiated discussion of my concerns of this with the patient's wife yesterday, by telephone.  I expressed concern that this will likely continue to happen in the setting of NIPPV noncompliance, his heart failure and morbid obesity/OSA/OHS.  Asked if she and her husband have discussed his wishes in the setting of inability to wean from ventilator or wishes for continued aggressive treatment in this situation.  She states that they had not discussed this. -Received empiric antibiotics in the ED. Continued no leukocytosis, low grade temp overnight. Normal procal.No lactic acidosis.  Chest x-ray more consistent with pulmonary edema on admission and improved with diuresis.  Plan Continue ventilator support to prevent eminent deterioration and further organ dysfunction from hypoxemia and hypercarbia.   Patient is at risk for sudden hypoxia, barotrauma and hemodynamic compromise.   Maintain SpO2 greater than or equal to 90%. Head of bed elevated 30 degrees. Plateau pressures  less than 30 cm H20.  Follow chest x-ray, ABG.   SAT/SBT today will goal for extubation. Bronchial hygiene. RT/bronchodilator protocol. Continue to diurese No beta-blocker while on pressors  Empiric monotherapy abx for now. Anticipate d/c same as current cx negative, no leukocytosis, normal procal and low index of suspicion for infection. Await sputum. Will need to establish clear goals of care from the patient's wife, hopefully including the patient and this discussion after extubation for clear plans moving forward.  I suspect this will continue to occur in the setting as mentioned above.  He could potentially require tracheostomy if this was the type of care they wanted to proceed with.  May need to re-engage palliative care. Palliative care consultation note from  04/19/2019 reviewed.  The following included for reference: "Clinical Assessment and Goals of Care: 1. Initial Code Status discussion today- he doesn't "want that tube shoved down my throat". He has a living will, completed with an attorney, wants to include his wife in conversation.  2. Poor insight into just how sick he really is in general. I provide information to him.  3. Goal is to get home and stay out of the hospital.  4. He is worried about losing access to his "pain medications". He sees PMR and has to get random "pee tests" which has been hard on both he and his wife.   5. He has lots of questions about the trilogy machine and is already worried about his ability to tolerate the mask  6. 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 It has an infection on my clinical assessment today and is very dry. He wants "drops" for this.  7. He is open to [facility] following him at home."  Hypotension-Suspect this is sedation related Plan Monitor VS Wean for MAP >/=65 PICC   History of chronic A. fib on anticoagulation Plan Continue Eliquis Continue amiodarone   Hyperkalemia, mild in setting of CKD stage III-IV.  Potassium was 5.5.  Now normalized. Plan Continue diurese Follow BMP I/O   Chronic lower extremity wounds present on admission Plan Wound care per their recommendations  Best practice:  Diet: NPO.  Nutrition consult for tube feeds if unable to extubate Pain/Anxiety/Delirium protocol (if indicated): fentanyl, propofol  VAP protocol (if indicated): yes DVT prophylaxis: Continue chronic Eliquis GI prophylaxis: PPI Glucose control: History of diabetes.  Sliding scale insulin Mobility: Bedrest Code Status: FULL CODE Family Communication: Will update patient's wife Disposition: Continue ICU  Labs   CBC: Recent Labs  Lab 05/12/19 1018 05/12/19 1222 05/13/19 0237  WBC 7.7  --  8.1   NEUTROABS 4.7  --   --   HGB 11.3* 12.6* 10.8*  HCT 40.1 37.0* 34.8*  MCV 105.2*  --  98.9  PLT 184  --  540    Basic Metabolic Panel: Recent Labs  Lab 05/12/19 1018 05/12/19 1222 05/12/19 1629 05/13/19 0237  NA 140 142  --  143  K 5.5* 4.9 4.7 3.9  CL 100  --   --  102  CO2 31  --   --  28  GLUCOSE 187*  --   --  81  BUN 37*  --   --  36*  CREATININE 2.74*  --   --  2.87*  CALCIUM 9.0  --   --  9.1  MG  --   --   --  1.9   GFR: Estimated  Creatinine Clearance: 45.4 mL/min (A) (by C-G formula based on SCr of 2.87 mg/dL (H)). Recent Labs  Lab 05/12/19 1018 05/12/19 1629 05/13/19 0237  PROCALCITON  --  0.18  --   WBC 7.7  --  8.1  LATICACIDVEN 0.8  --   --     Liver Function Tests: Recent Labs  Lab 05/12/19 1018 05/13/19 0237  AST 35 29  ALT 25 21  ALKPHOS 413* 340*  BILITOT 1.2 1.3*  PROT 7.1 6.4*  ALBUMIN 3.1* 2.8*   No results for input(s): LIPASE, AMYLASE in the last 168 hours. No results for input(s): AMMONIA in the last 168 hours.  ABG    Component Value Date/Time   PHART 7.337 (L) 05/12/2019 1222   PCO2ART 60.6 (H) 05/12/2019 1222   PO2ART 78.0 (L) 05/12/2019 1222   HCO3 32.4 (H) 05/12/2019 1222   TCO2 34 (H) 05/12/2019 1222   ACIDBASEDEF 1.0 02/16/2016 2031   O2SAT 94.0 05/12/2019 1222     Coagulation Profile: Recent Labs  Lab 05/12/19 1018  INR 1.5*    Cardiac Enzymes: No results for input(s): CKTOTAL, CKMB, CKMBINDEX, TROPONINI in the last 168 hours.  HbA1C: Hgb A1c MFr Bld  Date/Time Value Ref Range Status  04/14/2019 04:37 AM 7.8 (H) 4.8 - 5.6 % Final    Comment:    (NOTE) Pre diabetes:          5.7%-6.4% Diabetes:              >6.4% Glycemic control for   <7.0% adults with diabetes   03/19/2019 10:32 AM 8.8 (H) 4.6 - 6.5 % Final    Comment:    Glycemic Control Guidelines for People with Diabetes:Non Diabetic:  <6%Goal of Therapy: <7%Additional Action Suggested:  >8%     CBG: Recent Labs  Lab 05/12/19 1647 05/12/19  1946 05/12/19 2341 05/13/19 0359 05/13/19 0808  GLUCAP 132* 75 78 92 100*    Review of Systems:   UTO as pt is intubated and sedated.       The patient is critically ill with acute on chronic hypercarbic and hypoxic respiratory failure. He requires ICU for high complexity decision making, titration of high alert medications, ventilator management, titration of oxygen and interpretation of advanced monitoring.    I personally spent 45 minutes providing critical care services including personally reviewing test results, discussing care with nursing staff/other physicians and completing orders pertaining to this patient.  Time was exclusive to the patient and does not include time spent teaching or in procedures.  Voice recognition software was used in the production of this record.  Errors in interpretation may have been inadvertently missed during review.  Francine Graven, MSN, AGACNP  Pager [phone] or if no answer [phone] [facility] Pulmonary & Critical Care  Attending Note:  58 year old male with extensive PMH to include OSA and morbid obesity and non-compliance with medications.  Intubated now but weaning on exam this AM.  I reviewed CXR myself, ETT is in a good position.  Discussed with PCCM-NP.  Spoke with wife regarding code status and the patient's overall condition.  After discussion, decision was made to extubate patient when ready with no intention to reintubate and make a DNR.  Continue diureses.  Will make CPAP available if patient is to wear.  PCCM will continue to follow.  The patient is critically ill with multiple organ systems failure and requires high complexity decision making for assessment and support, frequent evaluation and titration of therapies, application of  advanced monitoring technologies and extensive interpretation of multiple databases.   Critical Care Time devoted to patient care services described in this note is  36  Minutes. This time reflects time  of care of this signee Dr Jennet Maduro. This critical care time does not reflect procedure time, or teaching time or supervisory time of PA/NP/Med student/Med Resident etc but could involve care discussion time.  Rush Farmer, M.D. [facility] Pulmonary/Critical Care Medicine. Pager: [phone]. After hours pager: [phone].

## 2019-05-13 NOTE — Progress Notes (Signed)
On shift assessment OG tube found not taped to ET tube.  OG placed today and charted at 18cm.  Measured at 37cm. New Xray taken. Per X ray "OG tube tip likely in the proximal duodenum". Pulled back to 46cm and secured to ET tube. Per CCM ok to use.  Kathleene Hazel RN

## 2019-05-13 NOTE — Progress Notes (Signed)
PHARMACY - PHYSICIAN COMMUNICATION CRITICAL VALUE ALERT - BLOOD CULTURE IDENTIFICATION (BCID)  Frank Clinch. is an 58 y.o. male who presented to [facility] on 05/12/2019 with a chief complaint of respiratory failure.  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)  Name of physician (or Provider) Contacted: Frank Graven, NP  Current antibiotics: cefepime  Changes to prescribed antibiotics recommended: no changes at this time, continue cefepime pending respiratory culture   Results for orders placed or performed during the hospital encounter of 05/12/19  Blood Culture ID Panel (Reflexed) (Collected: 05/12/2019 10:50 AM)  Result Value Ref Range   Enterococcus species NOT DETECTED NOT DETECTED   Listeria monocytogenes NOT DETECTED NOT DETECTED   Staphylococcus species DETECTED (A) NOT DETECTED   Staphylococcus aureus (BCID) NOT DETECTED NOT DETECTED   Methicillin resistance DETECTED (A) NOT DETECTED   Streptococcus species NOT DETECTED NOT DETECTED   Streptococcus agalactiae NOT DETECTED NOT DETECTED   Streptococcus pneumoniae NOT DETECTED NOT DETECTED   Streptococcus pyogenes NOT DETECTED NOT DETECTED   Acinetobacter baumannii NOT DETECTED NOT DETECTED   Enterobacteriaceae species NOT DETECTED NOT DETECTED   Enterobacter cloacae complex NOT DETECTED NOT DETECTED   Escherichia coli NOT DETECTED NOT DETECTED   Klebsiella oxytoca NOT DETECTED NOT DETECTED   Klebsiella pneumoniae NOT DETECTED NOT DETECTED   Proteus species NOT DETECTED NOT DETECTED   Serratia marcescens NOT DETECTED NOT DETECTED   Haemophilus influenzae NOT DETECTED NOT DETECTED   Neisseria meningitidis NOT DETECTED NOT DETECTED   Pseudomonas aeruginosa NOT DETECTED NOT DETECTED   Candida albicans NOT DETECTED NOT DETECTED   Candida glabrata NOT DETECTED NOT DETECTED   Candida krusei NOT DETECTED NOT DETECTED   Candida parapsilosis NOT DETECTED NOT DETECTED   Candida tropicalis NOT  DETECTED NOT DETECTED    [name], PharmD PGY2 Cardiology Pharmacy Resident Phone [phone] 05/13/2019       9:21 AM  Please check [email or web address] for unit-specific pharmacist phone numbers

## 2019-05-13 NOTE — Patient Outreach (Signed)
  [facility]) Care Management Chronic Special Needs Program    05/13/2019  Name: [name]., DOB: April 09, 1961  MRN: [id number]   Mr. [name] is enrolled in a chronic special needs plan for Heart Failure. Client noted to be admitted 05/13/2019 to the hospital with respiratory failure with hypercarbia and hypoxia; acute diastolic heart failure. Intubated. 4th admission in 6 months.   Individualized care plan sent to utilization management team.  Plan: continue care coordination with hospital liaison/inpateint care management as needed. Continue to follow as client's C-SNP chronic care management coordinator.  Thea Silversmith, RN, MSN, [place] [phone]

## 2019-05-13 NOTE — Progress Notes (Signed)
[facility] Progress Note Patient Name: [name]. DOB: 26-Jan-1961 MRN: [id number]   Date of Service  05/13/2019  HPI/Events of Note  Hypotension - BP = 79/46 with MAP = 56. Bedside nurse has cut back on his sedation with Fentanyl and Propofol. No CVL or CVP.  eICU Interventions  Will order: 1. Phenylephrine IV infusion. Titrate to MAP > 65.     Intervention Category Major Interventions: Hypotension - evaluation and management  Sommer,Steven Eugene 05/13/2019, 1:18 AM

## 2019-05-13 NOTE — Progress Notes (Signed)
Initial Nutrition Assessment  DOCUMENTATION CODES:   Morbid obesity  INTERVENTION:   Tube feeding:  -Vital High Protein @ 40 ml/hr via OGT -Increase by 10 ml Q4 hours to goal rate 60 ml/hr (1440 ml) -30 ml Prostat BID  Provides: 1640 kcals (1909 kcal with propofol), 156 grams protein, 1204 ml free water. Meets 100% of needs.   NUTRITION DIAGNOSIS:   Increased nutrient needs related to acute illness as evidenced by estimated needs.  GOAL:   Provide needs based on ASPEN/SCCM guidelines  MONITOR:   Diet advancement, Vent status, Labs, I & O's, Skin, TF tolerance, Weight trends  REASON FOR ASSESSMENT:   Ventilator    ASSESSMENT:   Patient with PMH significant for OHS, OSA, chronic respiratory failure, DM, CAD, failed CABG 2014, CVA, CKD III-IV, and CHF. Presents this admission with respiratory failure likely related to acute compensation of CHF.   Pt discussed during ICU rounds and with RN.   Failed SBT x2 today. Propofol being weaned. Continues with pressors. GOC discussed with family. Plan for extubation once able with no re-intubation. Messaged CCM regarding TF, awaiting response. Will leave recommendations.    Admission weight: 170 kg Current weight: 173.3 kg   Patient is currently intubated on ventilator support MV: 12.5 L/min Temp (24hrs), Avg:100.3 F (37.9 C), Min:100 F (37.8 C), Max:100.7 F (38.2 C)  Propofol: 10.2 ml/hr- provides 269 kcal from lipids daily  I/O: -1,865 ml since admit UOP: 1,625 ml x 24 hrs   Drips: neosynephrine, propofol Medications: 60 mg lasix BID, SS novolog Labs: CBG 75-100    NUTRITION - FOCUSED PHYSICAL EXAM:    Most Recent Value  Orbital Region  No depletion  Upper Arm Region  No depletion  Thoracic and Lumbar Region  Unable to assess  Buccal Region  No depletion  Temple Region  No depletion  Clavicle Bone Region  No depletion  Clavicle and Acromion Bone Region  No depletion  Scapular Bone Region  Unable to assess   Dorsal Hand  No depletion  Patellar Region  No depletion  Anterior Thigh Region  No depletion  Posterior Calf Region  No depletion  Edema (RD Assessment)  Moderate  Hair  Reviewed  Eyes  Unable to assess  Mouth  Unable to assess  Skin  Reviewed  Nails  Reviewed       Diet Order:   Diet Order            Diet NPO time specified  Diet effective now              EDUCATION NEEDS:   Not appropriate for education at this time  Skin:  Skin Assessment: Skin Integrity Issues: Skin Integrity Issues:: Stage II, DTI DTI: sacrum Stage II: buttocks  Last BM:  PTA  Height:   Ht Readings from Last 1 Encounters:  05/12/19 5\' 11"  (1.803 m)    Weight:   Wt Readings from Last 1 Encounters:  05/13/19 (!) 173.3 kg    Ideal Body Weight:  78.2 kg  BMI:  Body mass index is 53.28 kg/m.  Estimated Nutritional Needs:   Kcal:  1720-1995 kcal  Protein:  156-195 grams  Fluid:  >/= 1.8 L/day   Mariana Single RD, LDN Clinical Nutrition Pager # - [phone]

## 2019-05-13 NOTE — Progress Notes (Signed)
VAST consulted to check placement of PICC. Upon arriving at bedside noted pt does not have a PICC currently. Contacted unit RN who stated she meant to place an order to have a PICC placed. Educated unit RN how to place order.

## 2019-05-13 NOTE — Progress Notes (Addendum)
Brief f/u note.   Met with wife, [name], at bedside to review phone conversation and goals of care. She acknowledges understanding and desires to continue with same plan. She agrees to Palliative care consult for plans if able to D/c home. She also notified me of Right posterior thigh mass that pt was to have imaging for but unable to do so due to illness. This was not overtly noticeable on admission d/t body habitus and positioning. Difficult to assess quality of R posterior thigh due to girth and skin folds but noted firm area on R posterior thigh, that appears to be superficial cellulitis vs lypoma, vs dependent superficial fluid collection,  also, noted similar but smaller area on L medial thigh area. Neither immediately c/w deep cellulitis as no erythema or warmth. Acuity of both areas are unclear. Wife states the R was present last week and the L is new. At this point given w/u for infection is low yield, and risk of transport to CT for imaging is greater than benefit of obtaining imaging (doubt [place] will be adequate given location and body habitus) will continue observation and consider imaging if changes. Bedside RN drew lines of demarcation around both areas for ongoing assessment. Will ask wound care to assist.   Also note that the pt has failed SBT twice d/t low Vt. Will continue to limit sedation as much as possible to facilitate SBT.  SBT difficult d/t pt body habitus and inability to adequately sit up as large neck girth cause head to flex to chin and large ABD girth impedes adequate excursion.   All questions answered to wife's satisfaction.   Francine Graven, MSN, AGACNP  Pager [phone] or if no answer [phone] [facility] Pulmonary & Critical Care

## 2019-05-13 NOTE — Consult Note (Addendum)
[facility] Nurse wound follow up Wound type: Open lesions and blistering to posterior thighs. Not seen on initial assessment while on ED stretcher yesterday.  Patient was intubated and respiratory status made major repositioning difficult.   Unclear if lipoma or superficial cellulitis.  WBC are 8.1 today.  Will continue to observe this area.  Right posterior thigh is chronic per wife and she thinks left is new.  Due to edema, may be excess fluid filled.  No topical care at this time unless begins oozing.  At which point we will begin silicone foam dressing. Is on mattress with low air loss feature.  Measurement: 2 cm indurated lesion without erythema, warmth or tenderness.  Will not follow at this time.  Please re-consult if needed.  Domenic Moras MSN, RN, FNP-BC CWON Wound, Ostomy, Continence Nurse Pager [phone]

## 2019-05-13 NOTE — Progress Notes (Signed)
I discussed medical decisions and level of care regarding patient's condition with patient's wife/surrogate, Angie Fava, by phone,  as the patient is unable to participate at this time.  These discussions are necessary given the patient's clinical condition and need for clear goals of care regarding mechanical ventilation, resuscitation and other interventions such as invasive procedures if patient fails extubation as this is his second intubation in less than a month. The family/surrogate has decided, in accordance with patient's wishes that she states he has expressed to her, he will be DO NOT RE-INTUBATE AND DO NOT RESUSCITATE patient and in case of cardiac arrest/proceed with comfort measures.    EMR updated.

## 2019-05-13 NOTE — Progress Notes (Signed)
Peripherally Inserted Central Catheter/Midline Placement  The IV Nurse has discussed with the patient and/or persons authorized to consent for the patient, the purpose of this procedure and the potential benefits and risks involved with this procedure.  The benefits include less needle sticks, lab draws from the catheter, and the patient may be discharged home with the catheter. Risks include, but not limited to, infection, bleeding, blood clot (thrombus formation), and puncture of an artery; nerve damage and irregular heartbeat and possibility to perform a PICC exchange if needed/ordered by physician.  Alternatives to this procedure were also discussed.  Bard Power PICC patient education guide, fact sheet on infection prevention and patient information card has been provided to patient /or left at bedside.    PICC/Midline Placement Documentation  PICC Double Lumen 89/21/19 PICC Right Basilic 41 cm 0 cm (Active)  Indication for Insertion or Continuance of Line Vasoactive infusions 05/13/19 1100  Exposed Catheter (cm) 0 cm 05/13/19 1100  Site Assessment Clean;Dry;Intact 05/13/19 1100  Lumen #1 Status Flushed;Blood return noted;Saline locked 05/13/19 1100  Lumen #2 Status Flushed;Blood return noted;Saline locked 05/13/19 1100  Dressing Type Transparent;Securing device 05/13/19 1100  Dressing Status Clean;Dry;Intact;Antimicrobial disc in place 05/13/19 [place] checked and tightened 05/13/19 1100  Dressing Intervention New dressing;Other (Comment) 05/13/19 1100  Dressing Change Due 05/20/19 05/13/19 1100    Phone consent obtained from spouse via telephone with two RNs   Virgilio Belling 05/13/2019, 11:51 AM

## 2019-05-14 ENCOUNTER — Other Ambulatory Visit: Payer: Self-pay

## 2019-05-14 DIAGNOSIS — Z515 Encounter for palliative care: Secondary | ICD-10-CM

## 2019-05-14 DIAGNOSIS — Z7189 Other specified counseling: Secondary | ICD-10-CM

## 2019-05-14 DIAGNOSIS — J810 Acute pulmonary edema: Secondary | ICD-10-CM

## 2019-05-14 LAB — TRIGLYCERIDES: Triglycerides: 356 mg/dL — ABNORMAL HIGH (ref <150)

## 2019-05-14 LAB — COMPREHENSIVE METABOLIC PANEL
ALT: 14 U/L (ref 0–44)
AST: 15 U/L (ref 15–41)
Albumin: 2.3 g/dL — ABNORMAL LOW (ref 3.5–5.0)
Alkaline Phosphatase: 300 U/L — ABNORMAL HIGH (ref 38–126)
Anion gap: 11 (ref 5–15)
BUN: 43 mg/dL — ABNORMAL HIGH (ref 6–20)
CO2: 27 mmol/L (ref 22–32)
Calcium: 7.9 mg/dL — ABNORMAL LOW (ref 8.9–10.3)
Chloride: 105 mmol/L (ref 98–111)
Creatinine, Ser: 3.22 mg/dL — ABNORMAL HIGH (ref 0.61–1.24)
GFR calc Af Amer: 23 mL/min — ABNORMAL LOW (ref >60)
GFR calc non Af Amer: 20 mL/min — ABNORMAL LOW (ref >60)
Glucose, Bld: 171 mg/dL — ABNORMAL HIGH (ref 70–99)
Potassium: 2.8 mmol/L — ABNORMAL LOW (ref 3.5–5.1)
Sodium: 143 mmol/L (ref 135–145)
Total Bilirubin: 1.2 mg/dL (ref 0.3–1.2)
Total Protein: 5.9 g/dL — ABNORMAL LOW (ref 6.5–8.1)

## 2019-05-14 LAB — CBC
HCT: 33.4 % — ABNORMAL LOW (ref 39.0–52.0)
Hemoglobin: 10.1 g/dL — ABNORMAL LOW (ref 13.0–17.0)
MCH: 30.1 pg (ref 26.0–34.0)
MCHC: 30.2 g/dL (ref 30.0–36.0)
MCV: 99.4 fL (ref 80.0–100.0)
Platelets: 225 10*3/uL (ref 150–400)
RBC: 3.36 MIL/uL — ABNORMAL LOW (ref 4.22–5.81)
RDW: 14.8 % (ref 11.5–15.5)
WBC: 7.4 10*3/uL (ref 4.0–10.5)
nRBC: 0 % (ref 0.0–0.2)

## 2019-05-14 LAB — PHOSPHORUS
Phosphorus: 3.5 mg/dL (ref 2.5–4.6)
Phosphorus: 3.4 mg/dL (ref 2.5–4.6)
Phosphorus: 3.8 mg/dL (ref 2.5–4.6)

## 2019-05-14 LAB — GLUCOSE, CAPILLARY
Glucose-Capillary: 143 mg/dL — ABNORMAL HIGH (ref 70–99)
Glucose-Capillary: 158 mg/dL — ABNORMAL HIGH (ref 70–99)
Glucose-Capillary: 174 mg/dL — ABNORMAL HIGH (ref 70–99)
Glucose-Capillary: 255 mg/dL — ABNORMAL HIGH (ref 70–99)
Glucose-Capillary: 192 mg/dL — ABNORMAL HIGH (ref 70–99)
Glucose-Capillary: 145 mg/dL — ABNORMAL HIGH (ref 70–99)

## 2019-05-14 LAB — BASIC METABOLIC PANEL
Anion gap: 11 (ref 5–15)
BUN: 46 mg/dL — ABNORMAL HIGH (ref 6–20)
CO2: 28 mmol/L (ref 22–32)
Calcium: 8.3 mg/dL — ABNORMAL LOW (ref 8.9–10.3)
Chloride: 106 mmol/L (ref 98–111)
Creatinine, Ser: 3.33 mg/dL — ABNORMAL HIGH (ref 0.61–1.24)
GFR calc Af Amer: 22 mL/min — ABNORMAL LOW (ref >60)
GFR calc non Af Amer: 19 mL/min — ABNORMAL LOW (ref >60)
Glucose, Bld: 250 mg/dL — ABNORMAL HIGH (ref 70–99)
Potassium: 3.7 mmol/L (ref 3.5–5.1)
Sodium: 145 mmol/L (ref 135–145)

## 2019-05-14 LAB — MAGNESIUM: Magnesium: 2.1 mg/dL (ref 1.7–2.4)

## 2019-05-14 MED ORDER — LORAZEPAM 2 MG/ML IJ SOLN: 0.5000 mg | INTRAMUSCULAR | Status: DC | PRN

## 2019-05-14 MED ORDER — CHLORHEXIDINE GLUCONATE 0.12 % MT SOLN
15.0000 mL | Freq: Once | OROMUCOSAL | Status: AC
  Administered 2019-05-14: 08:00:00 15 mL via OROMUCOSAL
  Filled 2019-05-14: qty 15

## 2019-05-14 MED ORDER — ORAL CARE MOUTH RINSE
15.0000 mL | Freq: Two times a day (BID) | OROMUCOSAL | Status: DC
  Administered 2019-05-14 – 2019-06-05 (×32): 15 mL via OROMUCOSAL

## 2019-05-14 MED ORDER — POTASSIUM CHLORIDE 10 MEQ/50ML IV SOLN: 10.0000 meq | INTRAVENOUS | Status: DC

## 2019-05-14 MED ORDER — POTASSIUM CHLORIDE 10 MEQ/50ML IV SOLN
10.0000 meq | INTRAVENOUS | Status: AC
  Administered 2019-05-14 (×3): 10 meq via INTRAVENOUS
  Filled 2019-05-14: qty 50

## 2019-05-14 MED ORDER — SODIUM PHOSPHATES 45 MMOLE/15ML IV SOLN
30.0000 mmol | Freq: Once | INTRAVENOUS | Status: AC
  Administered 2019-05-14: 30 mmol via INTRAVENOUS
  Filled 2019-05-14: qty 10

## 2019-05-14 MED ORDER — FENTANYL CITRATE (PF) 100 MCG/2ML IJ SOLN
50.0000 ug | INTRAMUSCULAR | Status: DC | PRN
  Administered 2019-05-14: 50 ug via INTRAVENOUS
  Filled 2019-05-14 (×2): qty 2

## 2019-05-14 NOTE — Patient Outreach (Signed)
  [facility]) Care Management Chronic Special Needs Program   05/14/2019  Name: [name]., DOB: 1961-07-10  MRN: [id number]  The client was discussed in today's interdisciplinary care team meeting.  The following issues were discussed:  Client's needs, Changes in health status, Care Plan, Coordination of care and Care transitions  Participants present:                    Thea Silversmith, MSN, RN, CCM                     Melissa Sandlin RN,BSN,CCM, CDE  Kelli Churn, RN, CCM, CDE Quinn Plowman RN, BSN, CCM            Marco Collie, MD Maryella Shivers, MD            Gilda Crease, PharmD, RPh Bary Castilla, RN, BSN, MS, CCM Coralie Carpen, MD  Plan: continue to follow as client's care management coordinator.   Thea Silversmith, RN, MSN, [place] [phone]

## 2019-05-14 NOTE — Progress Notes (Signed)
[place] Progress Note Patient Name: [name]. DOB: 01-Mar-1961 MRN: [id number]   Date of Service  05/14/2019  HPI/Events of Note  PO4--- = 1.9 and Creatinine = 2.87.   eICU Interventions  Will order: 1. Replace PO4---. 2. Repeat PO4--- level at 11 AM.     Intervention Category Major Interventions: Electrolyte abnormality - evaluation and management  Jayanth Szczesniak [place] 05/14/2019, 1:34 AM

## 2019-05-14 NOTE — Progress Notes (Signed)
CRITICAL VALUE ALERT  Critical Value:  Triglycerides 356  Date & Time Notied:  05/14/19 0625  Provider Notified: elink  Orders Received/Actions taken: awaiting orders

## 2019-05-14 NOTE — Consult Note (Signed)
Consultation Note Date: 05/14/2019   Patient Name: [name].  DOB: [birth year]  MRN: [id number]  Age / Sex: 58 y.o., male  PCP: Frank Macadam, MD Referring Physician: Rush Farmer, MD  Reason for Consultation: Establishing goals of care  HPI/Patient Profile: 58 y.o. 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 Optimized with plans for one way extubation.   Clinical Assessment and Goals of Care: I met initially with [name] (still intubated) with wife along with Frank Gum, NP (PCCM). We were able to clarify his wishes for wanting extubation as he is alert and able to communicate his wishes with head nod or writing. Also clarified that he does not want re-intubation. We did discuss comfort medications and focus on symptom management with further decline. He wrote that he needed to be cleaned up prior to extubation so I stepped out of room and continued speaking with wife, [name], as nursing worked with [name].   [name] shares with me that her husband has really declined since 2014. He used to love working outside in the yard but now unable to do so. He mainly sits around the house watching television. She says that he has been able to get around and fix himself some food and go to the restroom. He has been getting weaker and she is concerned with falls and knows that he is no longer safe to be at home alone. She is worried about him being at home while she works. We discussed that we must first see how he does and we did discuss home with hospice and hospice facility as options.   [name] further shares with me that [name] has been talking recently about being  ready to go to heaven and be with his parents and his sister. She feels that he is at peace and ready when his time comes.   All questions/concerns addressed. Emotional support provided.   Primary Decision Maker PATIENT and wife    SUMMARY OF RECOMMENDATIONS   - DNR in place - One way extubation - Comfort care with further decline - PRN comfort medications in place in case of respiratory decline - Likely limited role for BiPAP if he declines (does not use CPAP and no plans for re-intubation) and would better benefit from meds for comfort  Code Status/Advance Care Planning:  DNR   Symptom Management:   PRN fentanyl ordered for pain/SOB (h/o anaphylaxis with morphine?? - will avoid with renal function anyway). He has had fentanyl with no effect so will continue use.   PRN Ativan for agitation/anxiety.   Palliative Prophylaxis:   Aspiration, Delirium Protocol, Frequent Pain Assessment and Turn Reposition   Psycho-social/Spiritual:   Desire for further Chaplaincy support:yes  Additional Recommendations: Education on Hospice  Prognosis:   Prognosis poor and likely weeks or less.  Will better determine over the next 1-2 days.   Discharge Planning: To Be Determined. Likely leaning towards hospice facility (wife interested in [facility]).       Primary Diagnoses: Present on Admission: . Acute on chronic respiratory failure with hypoxia and hypercapnia (HCC)   I have reviewed the medical record, interviewed the patient and family, and examined the patient. The following aspects are pertinent.  Past Medical History:  Diagnosis Date  . Brain tumor ([place])    Takes Topamax so patient will not have headaches  . Bruises easily   . CAD (coronary artery disease)    Cath 09, 99% LAD  . Cardiomyopathy, ischemic    Ischemic  . CHF (congestive heart failure) ([place])   . Cyst near coccyx   . Cyst near tailbone   . Diabetes mellitus without complication (HCC)    borderline  .  Edema of both legs    Takes Lasix  . Fibromyalgia   . Gout   . Headache(784.0)    with lights  . Hypertension    dr Percival Spanish  . Kidney stones   . Myocardial infarction ([place])   . Obesity   . Pneumonia    hx of  . Shortness of breath   . Stroke [facility])    Affected vision, walking, and facial drooping on right face   Social History   Socioeconomic History  . Marital status: Married    Spouse name: Not on file  . Number of children: Not on file  . Years of education: Not on file  . Highest education level: Not on file  Occupational History  . Not on file  Social Needs  . Financial resource strain: Not on file  . Food insecurity    Worry: Not on file    Inability: Not on file  . Transportation needs    Medical: Not on file    Non-medical: Not on file  Tobacco Use  . Smoking status: Former Smoker    Packs/day: 0.25    Years: 20.00    Pack years: 5.00    Types: Cigarettes  . Smokeless tobacco: Current User  . Tobacco comment: Rare tobacco.  "[name]".  Substance and Sexual Activity  . Alcohol use: No    Alcohol/week: 0.0 standard drinks  . Drug use: No  . Sexual activity: Yes  Lifestyle  . Physical activity    Days per week: Not on file    Minutes per session: Not on file  . Stress: Not on file  Relationships  . Social Herbalist on phone: Not on file    Gets together: Not on file    Attends religious service: Not on file    Active member of club or organization: Not on file    Attends meetings of clubs or organizations: Not on file    Relationship status: Not on file  Other Topics Concern  . Not on file  Social History Narrative  . Not on file   Family History  Problem Relation Age of Onset  . CAD Father   . Hypertension Mother   . Cancer Other        multiple relatives  . Diabetes Maternal Grandmother   . Diabetes Maternal Aunt   . Leukemia Cousin   . Leukemia Maternal Grandfather    Scheduled Meds: . amiodarone  100 mg Per NG  tube Daily  . apixaban  5 mg Per NG tube BID  . chlorhexidine  gluconate (MEDLINE KIT)  15 mL Mouth Rinse BID  . Chlorhexidine Gluconate Cloth  6 each Topical Daily  . feeding supplement (PRO-STAT SUGAR FREE 64)  30 mL Per Tube BID  . feeding supplement (VITAL HIGH PROTEIN)  1,000 mL Per Tube Q24H  . fentaNYL (SUBLIMAZE) injection  50 mcg Intravenous Once  . insulin aspart  0-15 Units Subcutaneous Q4H  . mouth rinse  15 mL Mouth Rinse 10 times per day  . pantoprazole (PROTONIX) IV  40 mg Intravenous QHS  . rosuvastatin  20 mg Per NG tube q1800  . sodium chloride flush  10-40 mL Intracatheter Q12H  . topiramate  100 mg Per NG tube QHS   Continuous Infusions: . ceFEPime (MAXIPIME) IV Stopped (05/13/19 2312)  . fentaNYL infusion INTRAVENOUS 0 mcg/hr (05/14/19 0851)  . phenylephrine (NEO-SYNEPHRINE) Adult infusion 60 mcg/min (05/14/19 0800)  . potassium chloride 10 mEq (05/14/19 0924)   PRN Meds:.acetaminophen, fentaNYL, midazolam, sodium chloride flush Allergies  Allergen Reactions  . Morphine And Related Anaphylaxis    "makes me stop breathing"  . Novocain [Procaine] Anaphylaxis  . Carbamazepine     shaking  . Codeine Other (See Comments)    "makes heart race" per patient  . Gabapentin Other (See Comments)    Blood in urine  . Ivp Dye [Iodinated Diagnostic Agents]     Passing out  . Other     Steroids makes hearts race  . Prednisone     Heart racing   Review of Systems  Constitutional: Positive for activity change.  Respiratory: Positive for shortness of breath.   Neurological: Positive for weakness.    Physical Exam Vitals signs and nursing note reviewed.  Constitutional:      General: He is not in acute distress.    Appearance: He is morbidly obese. He is ill-appearing.     Interventions: He is intubated.  Cardiovascular:     Rate and Rhythm: Tachycardia present. Rhythm irregularly irregular.  Pulmonary:     Effort: No tachypnea, accessory muscle usage or  respiratory distress. He is intubated.  Abdominal:     Palpations: Abdomen is soft.  Neurological:     Mental Status: He is alert and oriented to person, place, and time.     Vital Signs: BP 136/79   Pulse (!) 110   Temp 99 F (37.2 C) (Oral)   Resp 18   Ht 5' 11"  (1.803 m)   Wt (!) 172.4 kg   SpO2 93%   BMI 53.00 kg/m  Pain Scale: CPOT(pt nods no when asked if in pain) POSS *See Group Information*: 2-Acceptable,Slightly drowsy, easily aroused Pain Score: 0-No pain   SpO2: SpO2: 93 % O2 Device:SpO2: 93 % O2 Flow Rate: .O2 Flow Rate (L/min): 12 L/min  IO: Intake/output summary:   Intake/Output Summary (Last 24 hours) at 05/14/2019 1683 Last data filed at 05/14/2019 0800 Gross per 24 hour  Intake 2942.32 ml  Output 3480 ml  Net -537.68 ml    LBM:   Baseline Weight: Weight: (!) 170 kg Most recent weight: Weight: (!) 172.4 kg     Palliative Assessment/Data:     Time In: 1335 Time Out: 1445 Time Total: 70 min Greater than 50%  of this time was spent counseling and coordinating care related to the above assessment and plan.  Signed by: Vinie Sill, NP Palliative Medicine Team Pager # [phone] (M-F 8a-5p) Team Phone # [phone] (Nights/Weekends)

## 2019-05-14 NOTE — Progress Notes (Signed)
Patient placed back on full vent support due to decreased VT and sats even with an increase in PS to 14. Patient's sats improved on full support. RN notified.

## 2019-05-14 NOTE — Progress Notes (Signed)
Patient's wife called and update provided on patient status.  MD Nelda Marseille and NP Bowser have not rounded on patient yet this morning but there have been discussions about extubating today if patient status allows it.   Discussed this with patient's wife as per notes and orders patient is a DNR and would not be reintubated.  Patient's wife voiced understanding and stated she would be up to visit by or around 1200 today.   NP Bowser informed of conversation with patient's wife.

## 2019-05-14 NOTE — Progress Notes (Signed)
CRITICAL VALUE ALERT  Critical Value:  K 2.8  Date & Time Notied:  05/14/19 0625  Provider Notified: Warren Lacy  Orders Received/Actions taken: awaiting orders

## 2019-05-14 NOTE — Progress Notes (Signed)
[place] Progress Note Patient Name: [name]. DOB: 11/28/1960 MRN: [id number]   Date of Service  05/14/2019  HPI/Events of Note  Multiple issues: 1. Triglycerides = 356 - Patient on Propofol and 2. K+ = 2.8 and Creatinine = 3.22.   eICU Interventions  Will order: 1. D/C Propofol IV infusion. 2. Increase ceiling on Fentanyl IV infusion to 400 mcg/hour.  3. Cautiously replace K+.      Intervention Category Major Interventions: Electrolyte abnormality - evaluation and management  Steffen Hase [place] 05/14/2019, 6:54 AM

## 2019-05-14 NOTE — Procedures (Signed)
Extubation Procedure Note  Patient Details:   Name: [name]. DOB: Jul 25, 1961 MRN: [id number]   Airway Documentation:    Vent end date: 05/14/19 Vent end time: 1505   Evaluation  O2 sats: stable throughout Complications: No apparent complications Patient did tolerate procedure well. Bilateral Breath Sounds: Rhonchi, Diminished   Yes.  Pt extubated with RN at bedside per physician order and pt/family wishes. Pt suctioned orally and via ETT prior. Upon extubation pt able to speak name, good strong cough and no stridor was heard. Pt was placed on 4L nasal cannula- SpO2 93%. Incentive Spirometry initiated and 540ml was achieved. RT will continue to monitor.   [name] 05/14/2019, 3:15 PM

## 2019-05-14 NOTE — Progress Notes (Addendum)
NAME:  Frank Welliver., MRN:  [id number], DOB:  [birth year], LOS: 2 ADMISSION DATE:  05/12/2019, CONSULTATION DATE:  05/12/2019 REFERRING MD: Sedonia Small  CHIEF COMPLAINT: Respiratory failure  Brief History   58 yr old morbidly obese M PMH OHS, OSA, chronic respiratory failure with hypoxia and hypercapnia, on baseline 2 to 3 L/min home oxygen, noncompliant with CPAP IDDM, Afib, CAD, failed CABG in 2014, CVA, CKD stage 3-4, and diastolic HF known to heart failure and PCCM services presented to ED with 3 days of worsening shortness of breath, found to be hypercarbic and hypoxic requiring endotracheal intubation.  PCCM consulted for admission.  History of present illness   58 yr old morbidly obese M PMH  OHS, OSA, chronic respiratory failure with hypoxia and hypercapnia, on baseline 2 to 3 L/min home oxygen, noncompliant with/refuses CPAP and has declined sleep study set up during previous in-pt stay,  IDDM, Afib, CAD, failed CABG in 2014, CVA, CKD stage 3-4, and diastolic HF known to heart failure service and PCCM presented to ED with 3 days of worsening shortness of breath, found to be hypercarbic and hypoxic. He was requiring 15 L oxygen on nonrebreather and became somnolent therefore required endotracheal intubation. Received empiric abx. PCCM consulted for admission.  W/u significant for CXR c/w pulmonary edema. ABG pH 7.337 pCO2 60.6 pO2 78 Bicarb 32.4 K+ 5.5 Cre 2.74 (baseline from 2019-2020 1.62-peak of 4.19)  BNP 198 (down from 301.3 04/13/2019) WBC 7.7.  Lactic acid 0.8  Per EMR review: Eval by HF for hospital F/U 9/18 Increased torsemide to 80mg  BID   Admitted 04/30/2018  with increased dyspnea and mental status changes. He was thought to have acute metobolic encephalopathy with hypercapneic respiratory failure. Creatinine initially 4.19, has decreased to 2.15. He was on Bipap initially. He has been in atrial fibrillation with RVR, was started on Coreg but HR still running high. ECHO  was nondiagnostic so TEE completed with cardioversion. He was placed amiodarone. Diuresed with IV lasix and later transitioned to torsemide 40 mg twice a day. He was discharged to [facility].   Admitted 04/13/19 with A/C respiratory failure. Intubated . Placed empiric antibiotics and IV lasix.  He did not tolerate Bipap. He was able to wean his oxygen to 3 liters. Palliative Care consulted he declined hospice/palliative care.    Past Medical History    Brain tumor [facility])    Takes Topamax so patient will not have headaches   Bruises easily    CAD (coronary artery disease)    Cath 09, 99% LAD   Cardiomyopathy, ischemic    Ischemic   CHF (congestive heart failure) (HCC)    Cyst near coccyx    Cyst near tailbone    Diabetes mellitus without complication (HCC)    borderline   Edema of both legs    Takes Lasix   Fibromyalgia    Gout    Headache(784.0)    with lights   Hypertension    dr hochrein   Kidney stones    Myocardial infarction ([place])    Obesity    Pneumonia    hx of   Shortness of breath    Stroke ([place])    Affected vision, walking, and facial drooping on right face     Significant Hospital Events   9/21 admit 9/23 remains intubated. On Neo, weaning. Communicates via writing.   Consults:  Palliative care   Procedures:  ETT 9/21>>   Significant Diagnostic Tests:  9/21: CXR 1. Interval  endotracheal intubation, tube tip in appropriate position over the mid trachea. 2. There is new, extensive bilateral irregular heterogeneous and interstitial opacity, consistent with edema or infection. 3. Cardiomegaly status post median sternotomy. 9/22: CXR Poor image quality Improved aeration and decreased edema.   02/22/2019 Echo  1. The left ventricle has normal systolic function, with an ejection fraction of 55-60%. The cavity size was mildly dilated. There is mildly increased left ventricular wall thickness.  2. Left  atrial size was mildly dilated.  3. Extremely limited; definity used; normal LV systolic function; focal wall motion abnormality cannot be excluded; mild LVH; mild LVE.  TEE 05/08/2018 showed EF 55-60% with mildly dilated/mildly dysfunctional RV  Micro Data:  SARS Coronavirus 2 negative  9/21 Blood>>NGTD 9/21 Urine>> 9/21 Sputum>> MRSA surveillance negative   Antimicrobials:  Cefepime 9/21>> Vancomycin 9/21 x 1 dose.   Interim history/subjective:  Weaning neo Mentation improving this morning but still sedate. PSV CPAP well tolerated   Objective   Blood pressure (!) 154/76, pulse 98, temperature 99 F (37.2 C), temperature source Oral, resp. rate 14, height 5\' 11"  (1.803 m), weight (!) 172.4 kg, SpO2 90 %.    Vent Mode: PRVC FiO2 (%):  [40 %] 40 % Set Rate:  [22 bmp] 22 bmp Vt Set:  [600 mL] 600 mL PEEP:  [5 cmH20] 5 cmH20 Pressure Support:  [15 cmH20] 15 cmH20 Plateau Pressure:  [20 cmH20-31 cmH20] 20 cmH20   Intake/Output Summary (Last 24 hours) at 05/14/2019 0844 Last data filed at 05/14/2019 0700 Gross per 24 hour  Intake 2521.69 ml  Output 3480 ml  Net -958.31 ml   Filed Weights   05/12/19 1134 05/13/19 0440 05/14/19 0345  Weight: (!) 170 kg (!) 173.3 kg (!) 172.4 kg    Examination: General: Morbidly obese adult male, intubated sedated NAD  HENT: [place]. ETT OGT secure. Anicteric sclera. Thick, short neck.  CV: IRIR s1s2 no rgm. 1+ pulses   Lungs: Diminished lung sounds. Symmetrical chest expansion No accessory muscle use  ABD: Obese, soft, round, Distant bowel sounds.  GU: Condom cath  EXT:  BLE edema. No obvious joint deformity. Symmetrical bulk and tone  Skin: Lower extremity wraps. Pale, clean, warm, dry.  Neuro: Mildly sedated. Awake, calm, following commands with some intermittent confusion.   Resolved Hospital Problem list   NA  Assessment & Plan:   Acute on chronic hypercarbic and hypoxic respiratory failure -2/2 decompensated dCHF, morbid obesity,  OSA/OHS -NIPPV non compliance  -This is second intubation in a month for similar presentation. -Received empiric antibiotics in the ED. Continued no leukocytosis, low grade temp overnight. Normal procal.No lactic acidosis.   -s/p diuresis   Plan -WUA/SBT; tolerating PSV/CPAP well 9/23  -Optimize for extubation -When extubated will be one-way extubation -Wean sedation as able, RASS goal 0 -PAD and VAP bundles -Follow up trach aspirate. Very low suspicion for infection, feel it is ok to dc cefepime (started empirically with low suspicion infection, in setting of respiratory failure)  -Wife planning to arrive at noon 9/23, hope to extubate after she arrives   Hypotension -suspect this is largely sedation related. Low suspicion for infection and thus septic shock.  P Monitor VS Wean for MAP >/=65 PICC Propofol has been dc, Fentanyl for sedation   History of chronic A fib, on eliquis  P Continue Eliquis Continue amiodarone Continue tele   CKD III Hyperkalemia, resolved  P Follow BMP I/O  Chronic lower extremity wounds present on admission Plan Appreciate wound care recs  Trend WBC. No leukocytosis this admission Monitor for erythema, s/sx cellulitis   DMII P SSI  Goals of Care: Palliative care consultation note from 04/19/2019 reviewed.  The following included for reference: "Clinical Assessment and Goals of Care: 1. Initial Code Status discussion today- he doesn't "want that tube shoved down my throat". He has a living will, completed with an attorney, wants to include his wife in conversation. 2. Poor insight into just how sick he really is in general. I provide information to him. 3. Goal is to get home and stay out of the hospital. 4. He is worried about losing access to his "pain medications". He sees PMR and has to get random "pee tests" which has been hard on both he and his wife.  5. He has lots of questions about the trilogy machine and is already worried about  his ability to tolerate the mask  6. 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 It has an infection on my clinical assessment today and is very dry. He wants "drops" for this. 7. He is open to [facility] following him at home."  -ICU goal is to optimize for 1-way extubation  Best practice:  Diet: NPO. EN discontinued in anticipation of hopeful upcoming extubation. If unable to extubate 9/23, still feel it is ok to keep discontinued -- patient has received kcal from propofol for bulk of intubated period which has provided acute kcal source  Pain/Anxiety/Delirium protocol (if indicated): fentanyl VAP protocol (if indicated): yes DVT prophylaxis: Eliquis  GI prophylaxis: PPI Glucose control: History of diabetes.  Sliding scale insulin Mobility: Bedrest Code Status: DNR Family Communication: Pending 9.23. Wife hoping to arrive to hospital 9/23 noon  Disposition: Continue ICU   Labs   CBC: Recent Labs  Lab 05/12/19 1018 05/12/19 1222 05/13/19 0237 05/14/19 0511  WBC 7.7  --  8.1 7.4  NEUTROABS 4.7  --   --   --   HGB 11.3* 12.6* 10.8* 10.1*  HCT 40.1 37.0* 34.8* 33.4*  MCV 105.2*  --  98.9 99.4  PLT 184  --  224 017    Basic Metabolic Panel: Recent Labs  Lab 05/12/19 1018 05/12/19 1222 05/12/19 1629 05/13/19 0237 05/13/19 1841 05/14/19 0511  NA 140 142  --  143  --  143  K 5.5* 4.9 4.7 3.9  --  2.8*  CL 100  --   --  102  --  105  CO2 31  --   --  28  --  27  GLUCOSE 187*  --   --  81  --  171*  BUN 37*  --   --  36*  --  43*  CREATININE 2.74*  --   --  2.87*  --  3.22*  CALCIUM 9.0  --   --  9.1  --  7.9*  MG  --   --   --  1.9  --  2.1  PHOS  --   --   --   --  1.9* 3.5   GFR: Estimated Creatinine Clearance: 40.4 mL/min (A) (by C-G formula based on SCr of 3.22 mg/dL (H)). Recent Labs  Lab 05/12/19 1018 05/12/19 1629 05/13/19 0237 05/14/19 0511  PROCALCITON  --  0.18  --   --   WBC 7.7   --  8.1 7.4  LATICACIDVEN 0.8  --   --   --     Liver  Function Tests: Recent Labs  Lab 05/12/19 1018 05/13/19 0237 05/14/19 0511  AST 35 29 15  ALT 25 21 14   ALKPHOS 413* 340* 300*  BILITOT 1.2 1.3* 1.2  PROT 7.1 6.4* 5.9*  ALBUMIN 3.1* 2.8* 2.3*   No results for input(s): LIPASE, AMYLASE in the last 168 hours. No results for input(s): AMMONIA in the last 168 hours.  ABG    Component Value Date/Time   PHART 7.337 (L) 05/12/2019 1222   PCO2ART 60.6 (H) 05/12/2019 1222   PO2ART 78.0 (L) 05/12/2019 1222   HCO3 32.4 (H) 05/12/2019 1222   TCO2 34 (H) 05/12/2019 1222   ACIDBASEDEF 1.0 02/16/2016 2031   O2SAT 94.0 05/12/2019 1222     Coagulation Profile: Recent Labs  Lab 05/12/19 1018  INR 1.5*    Cardiac Enzymes: No results for input(s): CKTOTAL, CKMB, CKMBINDEX, TROPONINI in the last 168 hours.  HbA1C: Hgb A1c MFr Bld  Date/Time Value Ref Range Status  04/14/2019 04:37 AM 7.8 (H) 4.8 - 5.6 % Final    Comment:    (NOTE) Pre diabetes:          5.7%-6.4% Diabetes:              >6.4% Glycemic control for   <7.0% adults with diabetes   03/19/2019 10:32 AM 8.8 (H) 4.6 - 6.5 % Final    Comment:    Glycemic Control Guidelines for People with Diabetes:Non Diabetic:  <6%Goal of Therapy: <7%Additional Action Suggested:  >8%     CBG: Recent Labs  Lab 05/13/19 1616 05/13/19 2038 05/14/19 0028 05/14/19 0347 05/14/19 0822  GLUCAP 79 106* 143* 158* 174*    CRITICAL CARE Performed by: Cristal Generous   Total critical care time: 40 minutes  Critical care time was exclusive of separately billable procedures and treating other patients. Critical care was necessary to treat or prevent imminent or life-threatening deterioration.  Critical care was time spent personally by me on the following activities: development of treatment plan with patient and/or surrogate as well as nursing, discussions with consultants, evaluation of patient's response to treatment,  examination of patient, obtaining history from patient or surrogate, ordering and performing treatments and interventions, ordering and review of laboratory studies, ordering and review of radiographic studies, pulse oximetry and re-evaluation of patient's condition.  Eliseo Gum MSN, AGACNP-BC [place] [phone] If no answer, [phone] 05/14/2019, 8:45 AM  Attending Note:  58 year old male with OSA and non-compliance as well as CHF.  No events overnight.  On exam, diminished diffusely.  I reviewed CXR myself, ETT is in a good position.  Discussed with PCCM-NP.  Will continue weaning for now.  Will await wife's arrival prior to decision to extubate and recommend one way extubation.  Continue diureses for now.  PCCM will continue to manage for now.  The patient is critically ill with multiple organ systems failure and requires high complexity decision making for assessment and support, frequent evaluation and titration of therapies, application of advanced monitoring technologies and extensive interpretation of multiple databases.   Critical Care Time devoted to patient care services described in this note is  33  Minutes. This time reflects time of care of this signee Dr Jennet Maduro. This critical care time does not reflect procedure time, or teaching time or supervisory time of PA/NP/Med student/Med Resident etc but could involve care discussion time.  Rush Farmer, M.D. [facility] Pulmonary/Critical Care Medicine. Pager: [phone]. After hours pager: [phone].

## 2019-05-14 NOTE — Progress Notes (Addendum)
PCCM Brief Care Plan Update   I briefly met with the patient's wife after her arrival to the bedside 9/23. Vinie Sill NP (Palliative Care) was also in attendance. I briefly recapped the patient's recurring cycle of illness requiring recurrent intubation, and that this pattern is likely to persist. I recapped my understanding of the patient's care wishes, including No tracheostomy, DNR/DNI. I stated that my understanding of our present care goals are to pursue a 1-way extubation. Wife endorses this plan. Patient endorses this plan. A.Parker  NP and I again confirmed that the patient does not wish for reintubation, but instead for symptoms associated with respiratory failure to be managed. Patient and wife confirm. Wife voices some concerns with home care if patient is able to be discharged home with hospice.   Patient vent mode previously changed from PSV/CPAP to [facility] due to low Vt by RRT. I changed the vent mode to PSV/CPAP, with adequate Vt. I asked the patient if he feels ready for the "tube to come out." He wrote on paper that he would like to be cleaned up first.   Fenton Malling NP and the patient's wife exited the room to discuss care options further so nursing care of patient could commence.  Our plan will be to extubate patient this afternoon. Palliative care assistance is greatly appreciated PRN symptom management per Palliative Care.  Palliative is thoughtfully working with patient's wife to address her concerns RE care needs (home vs residential hospice)   -1 way extubation -PRN per palliative -DNR/DNI      Eliseo Gum MSN, AGACNP-BC [place] [phone] If no answer, [phone] 05/14/2019, 2:04 PM

## 2019-05-15 ENCOUNTER — Other Ambulatory Visit: Payer: Self-pay

## 2019-05-15 ENCOUNTER — Encounter (HOSPITAL_COMMUNITY): Payer: Self-pay

## 2019-05-15 DIAGNOSIS — J9602 Acute respiratory failure with hypercapnia: Secondary | ICD-10-CM

## 2019-05-15 DIAGNOSIS — R1310 Dysphagia, unspecified: Secondary | ICD-10-CM

## 2019-05-15 LAB — COMPREHENSIVE METABOLIC PANEL
ALT: 15 U/L (ref 0–44)
AST: 14 U/L — ABNORMAL LOW (ref 15–41)
Albumin: 2.6 g/dL — ABNORMAL LOW (ref 3.5–5.0)
Alkaline Phosphatase: 330 U/L — ABNORMAL HIGH (ref 38–126)
Anion gap: 13 (ref 5–15)
BUN: 42 mg/dL — ABNORMAL HIGH (ref 6–20)
CO2: 27 mmol/L (ref 22–32)
Calcium: 8.5 mg/dL — ABNORMAL LOW (ref 8.9–10.3)
Chloride: 106 mmol/L (ref 98–111)
Creatinine, Ser: 2.95 mg/dL — ABNORMAL HIGH (ref 0.61–1.24)
GFR calc Af Amer: 26 mL/min — ABNORMAL LOW (ref >60)
GFR calc non Af Amer: 22 mL/min — ABNORMAL LOW (ref >60)
Glucose, Bld: 160 mg/dL — ABNORMAL HIGH (ref 70–99)
Potassium: 3.2 mmol/L — ABNORMAL LOW (ref 3.5–5.1)
Sodium: 146 mmol/L — ABNORMAL HIGH (ref 135–145)
Total Bilirubin: 0.9 mg/dL (ref 0.3–1.2)
Total Protein: 6.3 g/dL — ABNORMAL LOW (ref 6.5–8.1)

## 2019-05-15 LAB — CULTURE, BLOOD (ROUTINE X 2): Special Requests: ADEQUATE

## 2019-05-15 LAB — URINE CULTURE: Culture: 60000 — AB

## 2019-05-15 LAB — CBC
HCT: 36 % — ABNORMAL LOW (ref 39.0–52.0)
Hemoglobin: 10.5 g/dL — ABNORMAL LOW (ref 13.0–17.0)
MCH: 29.9 pg (ref 26.0–34.0)
MCHC: 29.2 g/dL — ABNORMAL LOW (ref 30.0–36.0)
MCV: 102.6 fL — ABNORMAL HIGH (ref 80.0–100.0)
Platelets: 190 10*3/uL (ref 150–400)
RBC: 3.51 MIL/uL — ABNORMAL LOW (ref 4.22–5.81)
RDW: 14.6 % (ref 11.5–15.5)
WBC: 9.1 10*3/uL (ref 4.0–10.5)
nRBC: 0 % (ref 0.0–0.2)

## 2019-05-15 LAB — GLUCOSE, CAPILLARY
Glucose-Capillary: 128 mg/dL — ABNORMAL HIGH (ref 70–99)
Glucose-Capillary: 153 mg/dL — ABNORMAL HIGH (ref 70–99)
Glucose-Capillary: 155 mg/dL — ABNORMAL HIGH (ref 70–99)
Glucose-Capillary: 171 mg/dL — ABNORMAL HIGH (ref 70–99)
Glucose-Capillary: 183 mg/dL — ABNORMAL HIGH (ref 70–99)

## 2019-05-15 LAB — MAGNESIUM: Magnesium: 2.1 mg/dL (ref 1.7–2.4)

## 2019-05-15 LAB — TRIGLYCERIDES: Triglycerides: 123 mg/dL (ref <150)

## 2019-05-15 LAB — PHOSPHORUS: Phosphorus: 4.1 mg/dL (ref 2.5–4.6)

## 2019-05-15 MED ORDER — APIXABAN 5 MG PO TABS
5.0000 mg | ORAL_TABLET | Freq: Two times a day (BID) | ORAL | Status: DC
  Administered 2019-05-15 – 2019-06-05 (×43): 5 mg via ORAL
  Filled 2019-05-15 (×43): qty 1

## 2019-05-15 MED ORDER — IPRATROPIUM BROMIDE 0.02 % IN SOLN
0.5000 mg | Freq: Three times a day (TID) | RESPIRATORY_TRACT | Status: DC
  Administered 2019-05-16 – 2019-05-18 (×7): 0.5 mg via RESPIRATORY_TRACT
  Filled 2019-05-15 (×7): qty 2.5

## 2019-05-15 MED ORDER — LEVALBUTEROL HCL 0.63 MG/3ML IN NEBU
0.6300 mg | INHALATION_SOLUTION | Freq: Three times a day (TID) | RESPIRATORY_TRACT | Status: DC
  Administered 2019-05-15 (×2): 0.63 mg via RESPIRATORY_TRACT
  Filled 2019-05-15 (×3): qty 3

## 2019-05-15 MED ORDER — POTASSIUM CHLORIDE CRYS ER 20 MEQ PO TBCR
20.0000 meq | EXTENDED_RELEASE_TABLET | Freq: Once | ORAL | Status: AC
  Administered 2019-05-15: 10:00:00 20 meq via ORAL
  Filled 2019-05-15: qty 1

## 2019-05-15 MED ORDER — TOPIRAMATE 25 MG PO TABS
100.0000 mg | ORAL_TABLET | Freq: Every day | ORAL | Status: DC
  Administered 2019-05-15 – 2019-06-04 (×21): 100 mg via ORAL
  Filled 2019-05-15 (×2): qty 1
  Filled 2019-05-15: qty 4
  Filled 2019-05-15: qty 1
  Filled 2019-05-15: qty 4
  Filled 2019-05-15: qty 1
  Filled 2019-05-15: qty 4
  Filled 2019-05-15 (×3): qty 1
  Filled 2019-05-15: qty 4
  Filled 2019-05-15: qty 1
  Filled 2019-05-15: qty 4
  Filled 2019-05-15 (×6): qty 1
  Filled 2019-05-15: qty 4
  Filled 2019-05-15: qty 1
  Filled 2019-05-15: qty 4

## 2019-05-15 MED ORDER — GUAIFENESIN ER 600 MG PO TB12
600.0000 mg | ORAL_TABLET | Freq: Two times a day (BID) | ORAL | Status: DC
  Administered 2019-05-15 – 2019-05-19 (×8): 600 mg via ORAL
  Filled 2019-05-15 (×8): qty 1

## 2019-05-15 MED ORDER — RESOURCE THICKENUP CLEAR PO POWD
ORAL | Status: DC | PRN
  Administered 2019-05-16: 05:00:00 via ORAL
  Filled 2019-05-15 (×2): qty 125

## 2019-05-15 MED ORDER — TIZANIDINE HCL 4 MG PO TABS
4.0000 mg | ORAL_TABLET | Freq: Four times a day (QID) | ORAL | Status: DC | PRN
  Administered 2019-05-15 (×2): 4 mg via ORAL
  Filled 2019-05-15 (×3): qty 1

## 2019-05-15 MED ORDER — FUROSEMIDE 10 MG/ML IJ SOLN
40.0000 mg | Freq: Once | INTRAMUSCULAR | Status: AC
  Administered 2019-05-15: 10:00:00 40 mg via INTRAVENOUS
  Filled 2019-05-15: qty 4

## 2019-05-15 MED ORDER — LEVALBUTEROL HCL 0.63 MG/3ML IN NEBU
0.6300 mg | INHALATION_SOLUTION | Freq: Three times a day (TID) | RESPIRATORY_TRACT | Status: DC
  Administered 2019-05-16 – 2019-05-18 (×7): 0.63 mg via RESPIRATORY_TRACT
  Filled 2019-05-15 (×7): qty 3

## 2019-05-15 MED ORDER — ACETAMINOPHEN 325 MG PO TABS: 650.0000 mg | ORAL_TABLET | ORAL | Status: DC | PRN

## 2019-05-15 MED ORDER — IPRATROPIUM BROMIDE 0.02 % IN SOLN
0.5000 mg | Freq: Three times a day (TID) | RESPIRATORY_TRACT | Status: DC
  Administered 2019-05-15 (×2): 0.5 mg via RESPIRATORY_TRACT
  Filled 2019-05-15 (×3): qty 2.5

## 2019-05-15 MED ORDER — AMIODARONE HCL 200 MG PO TABS
100.0000 mg | ORAL_TABLET | Freq: Every day | ORAL | Status: DC
  Administered 2019-05-15 – 2019-06-05 (×22): 100 mg via ORAL
  Filled 2019-05-15 (×22): qty 1

## 2019-05-15 MED ORDER — ROSUVASTATIN CALCIUM 20 MG PO TABS
20.0000 mg | ORAL_TABLET | Freq: Every day | ORAL | Status: DC
  Administered 2019-05-15 – 2019-06-04 (×19): 20 mg via ORAL
  Filled 2019-05-15 (×18): qty 1

## 2019-05-15 MED ORDER — HYDROCODONE-ACETAMINOPHEN 10-325 MG PO TABS
1.0000 | ORAL_TABLET | ORAL | Status: DC | PRN
  Administered 2019-05-15 – 2019-06-05 (×63): 1 via ORAL
  Filled 2019-05-15 (×65): qty 1

## 2019-05-15 NOTE — Plan of Care (Signed)

## 2019-05-15 NOTE — Evaluation (Signed)
Occupational Therapy Evaluation Patient Details Name: [name]. MRN: [id number] DOB: 1960-12-09 Today's Date: 05/15/2019    History of Present Illness Pt is a 58 yo male admitted with worsening SOB, found to hypercarbic and hypoxic. ETT 9/21/9/23. PMH OHS, OSA, chronic respiratory failure with hypoxia and hypercapnia, on baseline 2 to 3 L/min home oxygen, noncompliant with CPAP IDDM, Afib, CAD, failed CABG in 2014, CVA, CKD stage 3-4, and diastolic HF   Clinical Impression   Pt at home, reports of falling from wife, using Rollator, sponge bathes, uses toilet aide for peri care, does not complete IADL, decreased meaningful occupations in general. Today Pt is max A +2 for bed mobility to come EOB, min A for seated grooming, total A for LB ADL, mod A for UB ADL. Pt's wife present throughout - stating that she works and Pt is unsafe to be home alone at this time. OT will follow acutely and currently recommending SNF to focus on deconditioning, balance deficits, and maximize safety and independence - will always coordinate and defer to Palliative Medicine Plan and with the family.     Follow Up Recommendations  SNF;Supervision/Assistance - 24 hour(or defer to Palliative services)    Equipment Recommendations  None recommended by OT    Recommendations for Other Services Other (comment)(Palliative)     Precautions / Restrictions Precautions Precautions: Fall Precaution Comments: blind in R eye Restrictions Weight Bearing Restrictions: No      Mobility Bed Mobility Overal bed mobility: Needs Assistance Bed Mobility: Supine to Sit Rolling: Max assist   Supine to sit: Max assist;+2 for physical assistance;HOB elevated     General bed mobility comments: Needed assist with LEs with use of pad to move hips as well as assist for elevation of trunk. took incr time to get pt to EOB.   Transfers Overall transfer level: Needs assistance Equipment used: 2 person hand held  assist Transfers: Sit to/from Stand Sit to Stand: Mod assist;+2 physical assistance;From elevated surface         General transfer comment: increased time, heavy UE use, needed mod assist to power up and to get steady on his feet with wide BOS and then took side steps to [facility].  Some assist to control descent into bed.     Balance Overall balance assessment: Needs assistance Sitting-balance support: No upper extremity supported;Feet supported Sitting balance-Leahy Scale: Fair Sitting balance - Comments: min guard assist to sit EOB for 5 minutes   Standing balance support: Bilateral upper extremity supported Standing balance-Leahy Scale: Poor Standing balance comment: heavy UE support needed to side step to [facility]                           ADL either performed or assessed with clinical judgement   ADL Overall ADL's : Needs assistance/impaired Eating/Feeding: NPO   Grooming: Set up;Minimal assistance;Sitting;Wash/dry hands;Wash/dry face;Brushing hair Grooming Details (indicate cue type and reason): multimodal cues for task completion Upper Body Bathing: Maximal assistance;Sitting   Lower Body Bathing: Total assistance;Bed level   Upper Body Dressing : Moderate assistance;Sitting   Lower Body Dressing: Total assistance   Toilet Transfer: Moderate assistance;+2 for physical assistance;+2 for safety/equipment   Toileting- Clothing Manipulation and Hygiene: Total assistance;+2 for safety/equipment;Sit to/from stand       Functional mobility during ADLs: Moderate assistance;+2 for physical assistance;+2 for safety/equipment(HHA for side steps up bed) General ADL Comments: decreased safety awarness and deficits     Vision Patient  Visual Report: No change from baseline;Other (comment)(blind in R eye)       Perception     Praxis      Pertinent Vitals/Pain Pain Assessment: Faces Faces Pain Scale: No hurt Pain Intervention(s): Monitored during session;Repositioned      Hand Dominance Right   Extremity/Trunk Assessment Upper Extremity Assessment Upper Extremity Assessment: Generalized weakness   Lower Extremity Assessment Lower Extremity Assessment: Defer to PT evaluation RLE Deficits / Details: movement limited by body habitus LLE Deficits / Details: movement limited by body habitus    Cervical / Trunk Assessment Cervical / Trunk Assessment: Other exceptions Cervical / Trunk Exceptions: morbid obesity   Communication Communication Communication: HOH;Expressive difficulties   Cognition Arousal/Alertness: Awake/alert Behavior During Therapy: WFL for tasks assessed/performed Overall Cognitive Status: History of cognitive impairments - at baseline Area of Impairment: Safety/judgement                 Orientation Level: Disoriented to;Situation;Time Current Attention Level: Sustained Memory: Decreased short-term memory Following Commands: Follows multi-step commands inconsistently Safety/Judgement: Decreased awareness of safety Awareness: Intellectual Problem Solving: Requires verbal cues     General Comments  HR to 135 bpm with sats desat to 85% on 3LO2 with activity, wife present    Exercises     Shoulder Instructions      Home Living Family/patient expects to be discharged to:: Private residence Living Arrangements: Spouse/significant other Available Help at Discharge: Family;Available PRN/intermittently Type of Home: Mobile home Home Access: Ramped entrance     Home Layout: One level     Bathroom Shower/Tub: Teacher, early years/pre: Standard Bathroom Accessibility: Yes How Accessible: Accessible via walker Home Equipment: [place] - 4 wheels;Bedside commode;Wheelchair - manual   Additional Comments: sponge bathes, sleeps in recliner      Prior Functioning/Environment Level of Independence: Needs assistance  Gait / Transfers Assistance Needed: Ambulates with rollator ADL's / Homemaking Assistance  Needed: assisted for sponge bathing and uses toilet aide for pericare, can dress independently   Comments: on 3L of O2 all the time        OT Problem List: Impaired balance (sitting and/or standing);Decreased activity tolerance;Decreased cognition;Decreased safety awareness;Decreased knowledge of use of DME or AE;Obesity      OT Treatment/Interventions: Self-care/ADL training;DME and/or AE instruction;Cognitive remediation/compensation;Patient/family education;Balance training;Therapeutic activities    OT Goals(Current goals can be found in the care plan section) Acute Rehab OT Goals Patient Stated Goal: to go home OT Goal Formulation: With patient Time For Goal Achievement: 05/29/19 Potential to Achieve Goals: Fair ADL Goals Pt Will Perform Grooming: with set-up;sitting Pt Will Perform Upper Body Dressing: with set-up;sitting Pt Will Perform Lower Body Dressing: with max assist;sit to/from stand Pt Will Transfer to Toilet: with min guard assist;stand pivot transfer;bedside commode Pt Will Perform Toileting - Clothing Manipulation and hygiene: sit to/from stand;with adaptive equipment;with min guard assist Additional ADL Goal #1: Pt will recall 3 ways of conserving energy during ADL with one or less verbal cues Additional ADL Goal #2: Pt will perform bed mobility at min A +2 prior to engaging in ADL  OT Frequency: Min 2X/week   Barriers to D/C: Decreased caregiver support  Pt's wife works, and he requires 24 hour supervision       Co-evaluation PT/OT/SLP Co-Evaluation/Treatment: Yes Reason for Co-Treatment: Complexity of the patient's impairments (multi-system involvement);For patient/therapist safety;To address functional/ADL transfers PT goals addressed during session: Mobility/safety with mobility;Balance OT goals addressed during session: ADL's and self-care;Strengthening/ROM      AM-PAC OT "  6 Clicks" Daily Activity     Outcome Measure Help from another person eating  meals?: Total(NPO) Help from another person taking care of personal grooming?: A Little Help from another person toileting, which includes using toliet, bedpan, or urinal?: Total Help from another person bathing (including washing, rinsing, drying)?: A Lot Help from another person to put on and taking off regular upper body clothing?: A Lot Help from another person to put on and taking off regular lower body clothing?: Total 6 Click Score: 10   End of Session Equipment Utilized During Treatment: Oxygen(3L O2) Nurse Communication: Mobility status  Activity Tolerance: Patient tolerated treatment well Patient left: in bed;with call bell/phone within reach;with family/visitor present  OT Visit Diagnosis: Unsteadiness on feet (R26.81);Other abnormalities of gait and mobility (R26.89);Other symptoms and signs involving cognitive function                Time: 1116-1141 OT Time Calculation (min): 25 min Charges:  OT General Charges $OT Visit: 1 Visit OT Evaluation $OT Eval Moderate Complexity: [place] OTR/L Acute Rehabilitation Services Pager: [phone] Office: [place] 05/15/2019, 4:02 PM

## 2019-05-15 NOTE — Evaluation (Signed)
Physical Therapy Evaluation Patient Details Name: [name]. MRN: [id number] DOB: 30-Apr-1961 Today's Date: 05/15/2019   History of Present Illness  Pt is a 58 yo male admitted with worsening SOB, found to hypercarbic and hypoxic. ETT 9/21/9/23. Pt has had multiple swallow evaluations in the past with all of them recommending thin liquids; mostly soft to regular solids. PMH OHS, OSA, chronic respiratory failure with hypoxia and hypercapnia, on baseline 2 to 3 L/min home oxygen, noncompliant with CPAP IDDM, Afib, CAD, failed CABG in 2014, CVA, CKD stage 3-4, and diastolic HF   Clinical Impression  Pt admitted with above diagnosis. Pt was able to sit EOB with min guard assist and stand at EOB with mod asssist of 2.  Not able to safely ambulate and desats with minimal activity. Pt states he can go home however wife present and states she cannot be there. Pt needs at least +2 assist currently. Will follow acutely. Pt currently with functional limitations due to the deficits listed below (see PT Problem List). Pt will benefit from skilled PT to increase their independence and safety with mobility to allow discharge to the venue listed below.      Follow Up Recommendations SNF;Supervision/Assistance - 24 hour    Equipment Recommendations  None recommended by PT    Recommendations for Other Services       Precautions / Restrictions Precautions Precautions: Fall Precaution Comments: blind in R eye Restrictions Weight Bearing Restrictions: No      Mobility  Bed Mobility Overal bed mobility: Needs Assistance Bed Mobility: Supine to Sit Rolling: Max assist   Supine to sit: Max assist;+2 for physical assistance;HOB elevated     General bed mobility comments: Needed assist with LEs with use of pad to move hips as well as assist for elevation of trunk. took incr time to get pt to EOB.   Transfers Overall transfer level: Needs assistance Equipment used: 2 person hand held  assist Transfers: Sit to/from Stand Sit to Stand: Mod assist;+2 physical assistance;From elevated surface         General transfer comment: increased time, heavy UE use, needed mod assist to power up and to get steady on his feet with wide BOS and then took side steps to [facility].  Some assist to control descent into bed.   Ambulation/Gait                Stairs            Wheelchair Mobility    Modified Rankin (Stroke Patients Only)       Balance Overall balance assessment: Needs assistance Sitting-balance support: No upper extremity supported;Feet supported Sitting balance-Leahy Scale: Fair Sitting balance - Comments: min guard assist to sit EOB for 5 minutes   Standing balance support: Bilateral upper extremity supported Standing balance-Leahy Scale: Poor Standing balance comment: heavy UE support needed to side step to [facility]                             Pertinent Vitals/Pain      Home Living Family/patient expects to be discharged to:: Private residence Living Arrangements: Spouse/significant other Available Help at Discharge: Family;Available PRN/intermittently Type of Home: Mobile home Home Access: Ramped entrance     Home Layout: One level Home Equipment: [place] - 4 wheels;Bedside commode;Wheelchair - manual Additional Comments: sponge bathes, sleeps in recliner    Prior Function Level of Independence: Needs assistance   Gait / Transfers Assistance  Needed: Ambulates with rollator  ADL's / Homemaking Assistance Needed: assisted for sponge bathing and uses toilet aide for pericare, can dress independently  Comments: on 3L of O2 all the time     Hand Dominance   Dominant Hand: Right    Extremity/Trunk Assessment   Upper Extremity Assessment Upper Extremity Assessment: Defer to OT evaluation    Lower Extremity Assessment Lower Extremity Assessment: RLE deficits/detail;LLE deficits/detail RLE Deficits / Details: movement limited by  body habitus LLE Deficits / Details: movement limited by body habitus     Cervical / Trunk Assessment Cervical / Trunk Assessment: Other exceptions Cervical / Trunk Exceptions: morbid obesity  Communication   Communication: HOH;Expressive difficulties  Cognition Arousal/Alertness: Awake/alert Behavior During Therapy: WFL for tasks assessed/performed Overall Cognitive Status: History of cognitive impairments - at baseline Area of Impairment: Safety/judgement                 Orientation Level: Disoriented to;Situation;Time Current Attention Level: Sustained Memory: Decreased short-term memory Following Commands: Follows multi-step commands inconsistently Safety/Judgement: Decreased awareness of safety Awareness: Intellectual Problem Solving: Requires verbal cues        General Comments General comments (skin integrity, edema, etc.): HR to 135 bpm with sats desat to 85% on 3LO2 with activity, wife present    Exercises     Assessment/Plan    PT Assessment Patient needs continued PT services  PT Problem List Decreased strength;Decreased activity tolerance;Decreased balance;Decreased mobility;Decreased cognition;Decreased safety awareness       PT Treatment Interventions DME instruction;Gait training;Functional mobility training;Therapeutic activities;Therapeutic exercise;Balance training;Patient/family education    PT Goals (Current goals can be found in the Care Plan section)  Acute Rehab PT Goals Patient Stated Goal: to go home PT Goal Formulation: With patient Time For Goal Achievement: 05/29/19 Potential to Achieve Goals: Fair    Frequency Min 3X/week   Barriers to discharge Decreased caregiver support(wife works)      Co-evaluation PT/OT/SLP Co-Evaluation/Treatment: Yes Reason for Co-Treatment: Complexity of the patient's impairments (multi-system involvement) PT goals addressed during session: Mobility/safety with mobility OT goals addressed during  session: ADL's and self-care       AM-PAC PT "6 Clicks" Mobility  Outcome Measure Help needed turning from your back to your side while in a flat bed without using bedrails?: A Lot Help needed moving from lying on your back to sitting on the side of a flat bed without using bedrails?: A Lot Help needed moving to and from a bed to a chair (including a wheelchair)?: Total Help needed standing up from a chair using your arms (e.g., wheelchair or bedside chair)?: A Lot Help needed to walk in hospital room?: Total Help needed climbing 3-5 steps with a railing? : Total 6 Click Score: 9    End of Session Equipment Utilized During Treatment: Oxygen;Gait belt Activity Tolerance: Patient limited by fatigue Patient left: with call bell/phone within reach;with family/visitor present;in bed Nurse Communication: Mobility status PT Visit Diagnosis: Muscle weakness (generalized) (M62.81);Difficulty in walking, not elsewhere classified (R26.2);Unsteadiness on feet (R26.81)    Time: 1116-1140 PT Time Calculation (min) (ACUTE ONLY): 24 min   Charges:   PT Evaluation $PT Eval Moderate Complexity: [place] Pager:  [phone]  Office:  [phone]    Denice Paradise 05/15/2019, 1:12 PM

## 2019-05-15 NOTE — Progress Notes (Signed)
RT attempted to instruct patient on the use of a flutter valve.  Due to a stroke, the patient is unable to wrap his lips around the mouth piece to use the flutter valve. RN notified.

## 2019-05-15 NOTE — Progress Notes (Signed)
PROGRESS NOTE    [name].  WNU:[id number] DOB: 07-12-1961 DOA: 05/12/2019 PCP: Caren Macadam, MD   Brief Narrative: 58 year old with past medical history significant for obesity, OHS, OSA, chronic respiratory failure with hypoxia and hypercapnia baseline 2 to 3 L of oxygen at home, noncompliant with CPAP, insulin-dependent diabetic, A. fib, CAD, failed CABG 2014, CVA, CKD stage III 4, diastolic heart failure presents with 3 days of worsening shortness of breath found to be hypercapnic and hypoxic requiring endotracheal intubation and PCCM admitted patient.  Chest x-ray was consistent with pulmonary edema, PO2 at 78, pH 7.3    Assessment & Plan:   Active Problems:   Acute on chronic respiratory failure with hypoxia and hypercapnia (HCC)   Goals of care, counseling/discussion   Palliative care encounter    1-Acute on chronic hypercapnic and hypoxic respiratory failure secondary to decompensated diastolic heart failure, morbid obesity, OSA/OHS. NIPPV no compliance This is patient's second intubation in a month. Patient was treated with IV Lexis. CM discussed with wife, patient was extubated, one-way extubation.  Patient was made DNR.  Palliative care was consulted for further goals of care. Continue with CPAP , triology  as tolerated We will give another IV dose of Lasix today.  Need to renal function closely.  2-hypotension; thought to be related to sedation Stable.  3-history of chronic A. fib on Eliquis Continue with Eliquis and amiodarone.  Chronic kidney disease a stage III, hyperkalemia resolved. Cr range 2.6---2.7 Chronic lower extremity wounds: Present on admission. Continue with local care  Diabetes type 2: Continue with a sliding scale. Nutrition; will defer palliative care further conversation with patient and wife in regards risk of aspiration Stage II buttock decubitus 1 pressure injury present on admission local care  Hypokalemia: Replete  x1.  Pressure Injury 04/16/19 Buttocks Left;Lower Stage II -  Partial thickness loss of dermis presenting as a shallow open ulcer with a red, pink wound bed without slough. (Active)  04/16/19 1519  Location: Buttocks  Location Orientation: Left;Lower  Staging: Stage II -  Partial thickness loss of dermis presenting as a shallow open ulcer with a red, pink wound bed without slough.  Wound Description (Comments):   Present on Admission:      Nutrition Problem: Increased nutrient needs Etiology: acute illness    Signs/Symptoms: estimated needs    Interventions: Prostat, Tube feeding  Estimated body mass index is 53 kg/m as calculated from the following:   Height as of this encounter: 5\' 11"  (1.803 m).   Weight as of this encounter: 172.4 kg.   DVT prophylaxis: Eliquis Code Status: DNR Family Communication: Wife over the phone Disposition Plan: Transfer to [place] Consultants:   CCM  Palliative care  Procedures:   None  Antimicrobials:  None  Subjective: He is alert and conversant, he was not able to sleep last night.  He is asking for his triology vent.  Wife will bring it.  he wants to use it tonight  Objective: Vitals:   05/15/19 0400 05/15/19 0500 05/15/19 0600 05/15/19 0700  BP:  (!) 108/51    Pulse: 89 74 86 81  Resp: (!) 23 18 (!) 22 (!) 23  Temp:      TempSrc:      SpO2: 93% 96% 90% 93%  Weight:      Height:        Intake/Output Summary (Last 24 hours) at 05/15/2019 0741 Last data filed at 05/15/2019 0700 Gross per 24 hour  Intake 1026.65  ml  Output 3775 ml  Net -2748.35 ml   Filed Weights   05/12/19 1134 05/13/19 0440 05/14/19 0345  Weight: (!) 170 kg (!) 173.3 kg (!) 172.4 kg    Examination:  General exam: Appears calm and comfortable , ill-appearing, morbid obese Respiratory system: Crackles at the bases Cardiovascular system: S1 & S2 heard, RRR. No JVD, murmurs, rubs, gallops or clicks.  Trace edema Gastrointestinal system: Abdomen  is nondistended, soft and nontender. No organomegaly or masses felt. Normal bowel sounds heard. Central nervous system: Alert and oriented. No focal neurological deficits. Extremities: Her extremity with dressings   Data Reviewed: I have personally reviewed following labs and imaging studies  CBC: Recent Labs  Lab 05/12/19 1018 05/12/19 1222 05/13/19 0237 05/14/19 0511 05/15/19 0619  WBC 7.7  --  8.1 7.4 9.1  NEUTROABS 4.7  --   --   --   --   HGB 11.3* 12.6* 10.8* 10.1* 10.5*  HCT 40.1 37.0* 34.8* 33.4* 36.0*  MCV 105.2*  --  98.9 99.4 102.6*  PLT 184  --  224 225 322   Basic Metabolic Panel: Recent Labs  Lab 05/12/19 1018 05/12/19 1222 05/12/19 1629 05/13/19 0237 05/13/19 1841 05/14/19 0511 05/14/19 1241 05/14/19 1806 05/15/19 0619  NA 140 142  --  143  --  143 145  --  146*  K 5.5* 4.9 4.7 3.9  --  2.8* 3.7  --  3.2*  CL 100  --   --  102  --  105 106  --  106  CO2 31  --   --  28  --  27 28  --  27  GLUCOSE 187*  --   --  81  --  171* 250*  --  160*  BUN 37*  --   --  36*  --  43* 46*  --  42*  CREATININE 2.74*  --   --  2.87*  --  3.22* 3.33*  --  2.95*  CALCIUM 9.0  --   --  9.1  --  7.9* 8.3*  --  8.5*  MG  --   --   --  1.9  --  2.1  --   --  2.1  PHOS  --   --   --   --  1.9* 3.5 3.4 3.8 4.1   GFR: Estimated Creatinine Clearance: 44 mL/min (A) (by C-G formula based on SCr of 2.95 mg/dL (H)). Liver Function Tests: Recent Labs  Lab 05/12/19 1018 05/13/19 0237 05/14/19 0511 05/15/19 0619  AST 35 29 15 14*  ALT 25 21 14 15   ALKPHOS 413* 340* 300* 330*  BILITOT 1.2 1.3* 1.2 0.9  PROT 7.1 6.4* 5.9* 6.3*  ALBUMIN 3.1* 2.8* 2.3* 2.6*   No results for input(s): LIPASE, AMYLASE in the last 168 hours. No results for input(s): AMMONIA in the last 168 hours. Coagulation Profile: Recent Labs  Lab 05/12/19 1018  INR 1.5*   Cardiac Enzymes: No results for input(s): CKTOTAL, CKMB, CKMBINDEX, TROPONINI in the last 168 hours. BNP (last 3 results) No  results for input(s): PROBNP in the last 8760 hours. HbA1C: No results for input(s): HGBA1C in the last 72 hours. CBG: Recent Labs  Lab 05/14/19 0822 05/14/19 1147 05/14/19 1618 05/14/19 2037 05/14/19 2359  GLUCAP 174* 255* 192* 145* 128*   Lipid Profile: Recent Labs    05/14/19 0511 05/15/19 0619  TRIG 356* 123   Thyroid Function Tests: No results for input(s): TSH, T4TOTAL,  FREET4, T3FREE, THYROIDAB in the last 72 hours. Anemia Panel: No results for input(s): VITAMINB12, FOLATE, FERRITIN, TIBC, IRON, RETICCTPCT in the last 72 hours. Sepsis Labs: Recent Labs  Lab 05/12/19 1018 05/12/19 1629  PROCALCITON  --  0.18  LATICACIDVEN 0.8  --     Recent Results (from the past 240 hour(s))  Blood Culture (routine x 2)     Status: Abnormal (Preliminary result)   Collection Time: 05/12/19 10:50 AM   Specimen: BLOOD RIGHT HAND  Result Value Ref Range Status   Specimen Description BLOOD RIGHT HAND  Final   Special Requests   Final    BOTTLES DRAWN AEROBIC AND ANAEROBIC Blood Culture adequate volume   Culture  Setup Time   Final    GRAM POSITIVE COCCI IN BOTH AEROBIC AND ANAEROBIC BOTTLES CRITICAL RESULT CALLED TO, READ BACK BY AND VERIFIED WITH: Hughie Closs, [place] AT [phone] ON 05/13/19 BY C. JESSUP, MT.    Culture (A)  Final    STAPHYLOCOCCUS SPECIES (COAGULASE NEGATIVE) THE SIGNIFICANCE OF ISOLATING THIS ORGANISM FROM A SINGLE SET OF BLOOD CULTURES WHEN MULTIPLE SETS ARE DRAWN IS UNCERTAIN. PLEASE NOTIFY THE MICROBIOLOGY DEPARTMENT WITHIN ONE WEEK IF SPECIATION AND SENSITIVITIES ARE REQUIRED. Performed at [facility], [place] [address] [place], [place] [address]    Report Status PENDING  Incomplete  Blood Culture ID Panel (Reflexed)     Status: Abnormal   Collection Time: 05/12/19 10:50 AM  Result Value Ref Range Status   Enterococcus species NOT DETECTED NOT DETECTED Final   Listeria monocytogenes NOT DETECTED NOT DETECTED Final   Staphylococcus species DETECTED (A)  NOT DETECTED Final    Comment: Methicillin (oxacillin) resistant coagulase negative staphylococcus. Possible blood culture contaminant (unless isolated from more than one blood culture draw or clinical case suggests pathogenicity). No antibiotic treatment is indicated for blood  culture contaminants. CRITICAL RESULT CALLED TO, READ BACK BY AND VERIFIED WITH: Hughie Closs, PHARMD AT [phone] ON 05/13/19 BY  C. JESSUP, MT.    Staphylococcus aureus (BCID) NOT DETECTED NOT DETECTED Final   Methicillin resistance DETECTED (A) NOT DETECTED Final    Comment: CRITICAL RESULT CALLED TO, READ BACK BY AND VERIFIED WITH: Hughie Closs, PHARMD AT [phone] ON 05/13/19 BY C. JESSUP, MT.    Streptococcus species NOT DETECTED NOT DETECTED Final   Streptococcus agalactiae NOT DETECTED NOT DETECTED Final   Streptococcus pneumoniae NOT DETECTED NOT DETECTED Final   Streptococcus pyogenes NOT DETECTED NOT DETECTED Final   Acinetobacter baumannii NOT DETECTED NOT DETECTED Final   Enterobacteriaceae species NOT DETECTED NOT DETECTED Final   Enterobacter cloacae complex NOT DETECTED NOT DETECTED Final   Escherichia coli NOT DETECTED NOT DETECTED Final   Klebsiella oxytoca NOT DETECTED NOT DETECTED Final   Klebsiella pneumoniae NOT DETECTED NOT DETECTED Final   Proteus species NOT DETECTED NOT DETECTED Final   Serratia marcescens NOT DETECTED NOT DETECTED Final   Haemophilus influenzae NOT DETECTED NOT DETECTED Final   Neisseria meningitidis NOT DETECTED NOT DETECTED Final   Pseudomonas aeruginosa NOT DETECTED NOT DETECTED Final   Candida albicans NOT DETECTED NOT DETECTED Final   Candida glabrata NOT DETECTED NOT DETECTED Final   Candida krusei NOT DETECTED NOT DETECTED Final   Candida parapsilosis NOT DETECTED NOT DETECTED Final   Candida tropicalis NOT DETECTED NOT DETECTED Final    Comment: Performed at [facility], [place]. [address] [place], [place] [address]  SARS Coronavirus 2 [facility] order, Performed in [facility] hospital lab) Nasopharyngeal  Nasopharyngeal Swab     Status: None   Collection Time: 05/12/19 11:01 AM   Specimen: Nasopharyngeal Swab  Result Value Ref Range Status   SARS Coronavirus 2 NEGATIVE NEGATIVE Final    Comment: (NOTE) If result is NEGATIVE SARS-CoV-2 target nucleic acids are NOT DETECTED. The SARS-CoV-2 RNA is generally detectable in upper and lower  respiratory specimens during the acute phase of infection. The lowest  concentration of SARS-CoV-2 viral copies this assay can detect is 250  copies / mL. A negative result does not preclude SARS-CoV-2 infection  and should not be used as the sole basis for treatment or other  patient management decisions.  A negative result may occur with  improper specimen collection / handling, submission of specimen other  than nasopharyngeal swab, presence of viral mutation(s) within the  areas targeted by this assay, and inadequate number of viral copies  (<250 copies / mL). A negative result must be combined with clinical  observations, patient history, and epidemiological information. If result is POSITIVE SARS-CoV-2 target nucleic acids are DETECTED. The SARS-CoV-2 RNA is generally detectable in upper and lower  respiratory specimens dur ing the acute phase of infection.  Positive  results are indicative of active infection with SARS-CoV-2.  Clinical  correlation with patient history and other diagnostic information is  necessary to determine patient infection status.  Positive results do  not rule out bacterial infection or co-infection with other viruses. If result is PRESUMPTIVE POSTIVE SARS-CoV-2 nucleic acids MAY BE PRESENT.   A presumptive positive result was obtained on the submitted specimen  and confirmed on repeat testing.  While 2019 novel coronavirus  (SARS-CoV-2) nucleic acids may be present in the submitted sample  additional confirmatory testing may be necessary for epidemiological  and / or clinical management  purposes  to differentiate between  SARS-CoV-2 and other Sarbecovirus currently known to infect humans.  If clinically indicated additional testing with an alternate test  methodology [id number]) is advised. The SARS-CoV-2 RNA is generally  detectable in upper and lower respiratory sp ecimens during the acute  phase of infection. The expected result is Negative. Fact Sheet for Patients:  StrictlyIdeas.no Fact Sheet for Healthcare Providers: BankingDealers.co.za This test is not yet approved or cleared by the [place] FDA and has been authorized for detection and/or diagnosis of SARS-CoV-2 by FDA under an Emergency Use Authorization (EUA).  This EUA will remain in effect (meaning this test can be used) for the duration of the COVID-19 declaration under Section 564(b)(1) of the Act, 21 U.S.C. section 360bbb-3(b)(1), unless the authorization is terminated or revoked sooner. Performed at [facility], [place] [address] [place], [place] [address]   Urine culture     Status: Abnormal   Collection Time: 05/12/19  2:49 PM   Specimen: In/Out Cath Urine  Result Value Ref Range Status   Specimen Description IN/OUT CATH URINE  Final   Special Requests   Final    NONE Performed at [facility], [place] [address], [place], [place] [address]    Culture 60,000 COLONIES/mL PROTEUS PENNERI (A)  Final   Report Status 05/15/2019 FINAL  Final   Organism ID, Bacteria PROTEUS PENNERI (A)  Final      Susceptibility   Proteus penneri - MIC*    AMPICILLIN >=32 RESISTANT Resistant     CEFAZOLIN >=64 RESISTANT Resistant     CEFTRIAXONE >=64 RESISTANT Resistant     CIPROFLOXACIN >=4 RESISTANT Resistant     GENTAMICIN <=1 SENSITIVE  Sensitive     IMIPENEM <=0.25 SENSITIVE Sensitive     NITROFURANTOIN 256 RESISTANT Resistant     TRIMETH/SULFA <=20 SENSITIVE Sensitive     AMPICILLIN/SULBACTAM >=32 RESISTANT Resistant     PIP/TAZO <=4 SENSITIVE  Sensitive     * 60,000 COLONIES/mL PROTEUS PENNERI  Blood Culture (routine x 2)     Status: None (Preliminary result)   Collection Time: 05/12/19  4:29 PM   Specimen: BLOOD RIGHT HAND  Result Value Ref Range Status   Specimen Description BLOOD RIGHT HAND  Final   Special Requests   Final    BOTTLES DRAWN AEROBIC ONLY Blood Culture results may not be optimal due to an inadequate volume of blood received in culture bottles   Culture   Final    NO GROWTH 2 DAYS Performed at [facility], [place] [address], [place], [place] [address]    Report Status PENDING  Incomplete  MRSA PCR Screening     Status: None   Collection Time: 05/12/19  4:47 PM   Specimen: Nasopharyngeal  Result Value Ref Range Status   MRSA by PCR NEGATIVE NEGATIVE Final    Comment:        The GeneXpert MRSA Assay (FDA approved for NASAL specimens only), is one component of a comprehensive MRSA colonization surveillance program. It is not intended to diagnose MRSA infection nor to guide or monitor treatment for MRSA infections. Performed at [facility], [place] [address], [place], [place] [address]          Radiology Studies: Dg Chest Port 1 View  Result Date: 05/13/2019 CLINICAL DATA:  PICC line. EXAM: PORTABLE CHEST 1 VIEW COMPARISON:  05/13/2019. FINDINGS: PICC line noted with its tip over superior vena cava. Endotracheal tube, NG tube in stable position. Prior CABG. Cardiomegaly. Bilateral interstitial prominence suggesting interstitial edema noted. No prominent pleural effusion. No pneumothorax. IMPRESSION: 1. PICC line noted with tip over superior vena cava. Endotracheal tube and NG tube stable position. 2. Prior CABG. Cardiomegaly with bilateral interstitial prominence suggesting interstitial edema. Electronically Signed   By: Marcello Moores  Register   On: 05/13/2019 12:33   [place] Ekg Site Rite  Result Date: 05/13/2019 If Site Rite image not attached, placement could not be confirmed due to current cardiac  rhythm.       Scheduled Meds: . amiodarone  100 mg Per NG tube Daily  . apixaban  5 mg Per NG tube BID  . Chlorhexidine Gluconate Cloth  6 each Topical Daily  . insulin aspart  0-15 Units Subcutaneous Q4H  . mouth rinse  15 mL Mouth Rinse BID  . pantoprazole (PROTONIX) IV  40 mg Intravenous QHS  . rosuvastatin  20 mg Per NG tube q1800  . sodium chloride flush  10-40 mL Intracatheter Q12H  . topiramate  100 mg Per NG tube QHS   Continuous Infusions: . phenylephrine (NEO-SYNEPHRINE) Adult infusion Stopped (05/14/19 1007)     LOS: 3 days    Time spent: 35 minutes.     Elmarie Shiley, MD [facility] Pager [phone]  If 7PM-7AM, please contact night-coverage [email or web address] Password [facility] 05/15/2019, 7:41 AM

## 2019-05-15 NOTE — Progress Notes (Addendum)
Arrived to unit approx 2017. Upon VS assessment at 2024 patient O2 87% on 5L, O2 increased to 6L O2 increased to 90-91%  Paged NP Schorr at 2158. Informed please order thicken up for patient. on Dys 2 diet.   Trilogy vent device initiated per patient request. Patient 95% on Trilogy with 6L O2 at approx 2300  Spoke with RT at 2311 to assess Trilogy settings as patient stated should be 100% and feels slightly SOB. RT to bedside approx 2330  Notified by NT that IV team RN observed HR increase to 147 on monitor while in patient's room then returned to 80's. RN to bedside to assess, patient asleep and HR in hi 70's.   After patient care, patient reported not receiving enough O2 via Trilogy. RN advised patient to keep nasal cannula on until RT arrived to assess. Spoke with RT at 0529, RT to bedside to assist at approx 0535.

## 2019-05-15 NOTE — Progress Notes (Signed)
Palliative:  HPI: 58 y.o. 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 Optimized with plans for one way extubation completed 9/23. Continuing conversations for possible hospice care and overall GOC. So far he is doing well off vent.   I met this morning with [name]. He is alert and although mostly oriented he is forgetful and with very delayed responses. I attempted to discuss with him concern with his swallowing and aspiration vs accepting of this risk and allowing him to eat/drink for his comfort. He was unable to comprehend the risks vs benefits of this decision. It became clear that he desired food/drink (as he began listing his requests for lunch). He did well with pudding and his medications and ate the entire pudding and was asking for more. I will order dys 2, nectar thick diet and monitor closely along with SLP. He does have increased coughing (even without intake though). He is difficult to stay focused and easily distracted. He also gets fixated on certain things during conversation.   I returned to bedside when wife arrived. I explained to her risk of aspiration but that he wants food/drink and what was decided. She agrees with modified diet and monitoring and does not want to withhold food/drink from him. She is interested in focus on comfort care. She is also worried about him getting pneumonia which I explain this is a high risk and certainly likely if he aspirates and that it only takes one swallow into the lungs to trigger this. She understands.   We spoke further about options from here. [name] is insistent that he return home but wife works and there is nobody to be there with him. Wife would prefer for him to be at [facility] facility. They  have known multiple people that have been at [facility] at [facility]. Given his history of acute decline monthly I believe this is an appropriate option. Especially given new concern for aspiration. He also falls asleep often mid conversation. I do not feel that he will do very well for very long and expressed this to wife at bedside. I encouraged them to continue discussion regarding transition to hospice between the 2 of them.   All questions/concerns addressed. Emotional support provided. I will follow up tomorrow.   Exam: Alert, oriented but poor insight and focus. Often falls asleep in conversation. Obese. Breathing mildly labored at rest, diminished. Abd soft.   Plan: - Wife hopeful for [facility] but patient hopeful to return home. Encouraged further conversation. There is the chance that he could decline acutely in hospital as well.  - SLP following and decision for slightly modified diet to try and provide some assistance with safety but also to allow for intake per his desire.   1010-1045, [id number] 75 min  Vinie Sill, NP Palliative Medicine Team Pager [phone] (Please see [email or web address] for schedule) Team Phone [phone]    Greater than 50%  of this time was spent counseling and coordinating care related to the above assessment and plan

## 2019-05-15 NOTE — Evaluation (Signed)
Clinical/Bedside Swallow Evaluation Patient Details  Name: [name]. MRN: [id number] Date of Birth: 08-23-1960  Today's Date: 05/15/2019 Time: SLP Start Time (ACUTE ONLY): 0901 SLP Stop Time (ACUTE ONLY): 0926 SLP Time Calculation (min) (ACUTE ONLY): 25 min  Past Medical History:  Past Medical History:  Diagnosis Date  . Brain tumor ([place])    Takes Topamax so patient will not have headaches  . Bruises easily   . CAD (coronary artery disease)    Cath 09, 99% LAD  . Cardiomyopathy, ischemic    Ischemic  . CHF (congestive heart failure) ([place])   . Cyst near coccyx   . Cyst near tailbone   . Diabetes mellitus without complication (HCC)    borderline  . Edema of both legs    Takes Lasix  . Fibromyalgia   . Gout   . Headache(784.0)    with lights  . Hypertension    dr Percival Spanish  . Kidney stones   . Myocardial infarction ([place])   . Obesity   . Pneumonia    hx of  . Shortness of breath   . Stroke [facility])    Affected vision, walking, and facial drooping on right face   Past Surgical History:  Past Surgical History:  Procedure Laterality Date  . 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 ANAL FISTULECTOMY    . CARDIOVERSION N/A 05/08/2018   Procedure: CARDIOVERSION;  Surgeon: [name], MD;  Location: [facility] ENDOSCOPY;  Service: Cardiovascular;  Laterality: N/A;  . CORONARY ARTERY BYPASS GRAFT     LIMA to LAD 2009  . CRANIOTOMY Right 11/20/2012   Procedure: CRANIOTOMY REPAIR DURAL/CENTRAL SPINAL FLUID LEAK;  Surgeon: [name], MD;  Location: [place] NEURO ORS;  Service: Neurosurgery;  Laterality: Right;  . EYE SURGERY     to coorect lid and double vision  . MEDIAN RECTUS REPAIR Right 02/16/2016   Procedure: RIGHT LATERAL RECTUS RESECTION; SUPERIOR RECTUS RESECTION RIGHT EYE; RIGHT SUPERIOR RECTUS RECESSION; LATERAL TARSAL STRIP RIGHT LOWER  EYELID;  Surgeon: [name], MD;  Location: [place];  Service: Ophthalmology;  Laterality: Right;  . PLACEMENT OF LUMBAR DRAIN N/A 11/20/2012   Procedure: Attempted PLACEMENT OF LUMBAR DRAIN;  Surgeon: [name], MD;  Location: [place] NEURO ORS;  Service: Neurosurgery;  Laterality: N/A;  . RETROSIGMOID CRANIECTOMY FOR TUMOR RESECTION Right 10/11/2012   Procedure: RETROSIGMOID CRANIECTOMY FOR TUMOR RESECTION;  Surgeon: [name], MD;  Location: [place] NEURO ORS;  Service: Neurosurgery;  Laterality: Right;  Craniotomy for acoustic neuroma  . TEE WITHOUT CARDIOVERSION N/A 05/08/2018   Procedure: TRANSESOPHAGEAL ECHOCARDIOGRAM (TEE);  Surgeon: [name], MD;  Location: [facility] ENDOSCOPY;  Service: Cardiovascular;  Laterality: N/A;  . TRACHEOSTOMY TUBE PLACEMENT N/A 10/11/2012   Procedure: TRACHEOSTOMY;  Surgeon: Ascencion Dike, MD;  Location: [facility] NEURO ORS;  Service: ENT;  Laterality: N/A;  . UMBILICAL HERNIA REPAIR     HPI:  Pt is a 58 yo male admitted with worsening SOB, found to hypercarbic and hypoxic. ETT 9/21/9/23. Pt has had multiple swallow evaluations in the past with all of them recommending thin liquids; mostly soft to regular solids. PMH OHS, OSA, chronic respiratory failure with hypoxia and hypercapnia, on baseline 2 to 3 L/min home oxygen, noncompliant with CPAP IDDM, Afib, CAD, failed CABG in 2014, CVA, CKD stage 3-4, and diastolic HF   Assessment /  Plan / Recommendation Clinical Impression  Pt has immediate and delayed coughing regardless of solid or liquid consistencies presented. Oftent his coughing occurs after a brief delay and moments of eructation, making it suspicious for an esophageal issue. However, he also describes a sore throat and dysphonia (mild) s/p extubation, and we have not observed him coughing in swallow evaluations in the past. The etiology of his coughing is certainly difficult to elicit clinically, but it is a potentially significatn clinical change. SLP initiated a  conversation with the pt about potential options, including whether or not he would want an instrumental swallow study to determine more accurately what his swallow function/aspiration risk were. We also discussed giving him a little more time after extubation before starting meal trays versus accepting aspiration risk and allowing him to eat and drink what he wants. Pt had a difficult time making a decision at the moment, given a variety of internal distractors and suspect limited understanding of the implications of his decision. SLP notified attending MD and palliative care NP, and encouraged pt to think about this more and discuss with his other providers. If pt opts for comfort feeds, he does not have to have any diet restrictions and in fact since there is coughing regardless of consistency, it is difficult to determine what a "safer" diet would be. In the meantime, would allow ice chips and meds crushed in puree while providing diligent oral care. Will continue to follow and assist as able. SLP Visit Diagnosis: Dysphagia, unspecified (R13.10)    Aspiration Risk  Moderate aspiration risk    Diet Recommendation NPO except meds;Ice chips PRN after oral care   Medication Administration: Crushed with puree    Other  Recommendations Oral Care Recommendations: Oral care QID Other Recommendations: Have oral suction available   Follow up Recommendations (tba)      Frequency and Duration min 2x/week  2 weeks       Prognosis Prognosis for Safe Diet Advancement: Fair Barriers to Reach Goals: Other (Comment)(overall prognosis)      Swallow Study   General HPI: Pt is a 58 yo male admitted with worsening SOB, found to hypercarbic and hypoxic. ETT 9/21/9/23. Pt has had multiple swallow evaluations in the past with all of them recommending thin liquids; mostly soft to regular solids. PMH OHS, OSA, chronic respiratory failure with hypoxia and hypercapnia, on baseline 2 to 3 L/min home oxygen,  noncompliant with CPAP IDDM, Afib, CAD, failed CABG in 2014, CVA, CKD stage 3-4, and diastolic HF Type of Study: Bedside Swallow Evaluation Previous Swallow Assessment: see HPI Diet Prior to this Study: NPO Temperature Spikes Noted: No Respiratory Status: Nasal cannula History of Recent Intubation: Yes Length of Intubations (days): 3 days Date extubated: 05/14/19 Behavior/Cognition: Alert;Cooperative;Distractible;Requires cueing Oral Cavity Assessment: Within Functional Limits Oral Care Completed by SLP: No Oral Cavity - Dentition: Edentulous Vision: Functional for self-feeding Self-Feeding Abilities: Needs assist Patient Positioning: Upright in bed Baseline Vocal Quality: Other (comment)(mildly rough, pt says not his baseline) Volitional Swallow: Able to elicit    Oral/Motor/Sensory Function Overall Oral Motor/Sensory Function: (baseline R sided facial weakness)   Ice Chips Ice chips: Impaired Presentation: Spoon Pharyngeal Phase Impairments: Cough - Immediate;Cough - Delayed   Thin Liquid Thin Liquid: Impaired Presentation: Cup;Self Fed;Straw;Spoon Oral Phase Impairments: Reduced labial seal Oral Phase Functional Implications: Right anterior spillage Pharyngeal  Phase Impairments: Cough - Immediate;Cough - Delayed    Nectar Thick Nectar Thick Liquid: Not tested   Honey Thick Honey Thick Liquid: Not tested  Puree Puree: Impaired Presentation: Spoon Pharyngeal Phase Impairments: Cough - Delayed   Solid     Solid: Impaired Presentation: Self Fed Pharyngeal Phase Impairments: Cough - Delayed      Venita Sheffield Salvador [place] 05/15/2019,10:13 AM   Pollyann Glen, M.A. [place] Acute Environmental education officer [phone] Office [phone]

## 2019-05-15 NOTE — Progress Notes (Signed)
Nutrition Follow-up  DOCUMENTATION CODES:   Morbid obesity  INTERVENTION:    Magic cup TID with meals, each supplement provides 290 kcal and 9 grams of protein  MVI daily   NUTRITION DIAGNOSIS:   Increased nutrient needs related to acute illness as evidenced by estimated needs.  Ongoing  GOAL:   Provide needs based on ASPEN/SCCM guidelines  Diet advanced   MONITOR:   Diet advancement, Vent status, Labs, I & O's, Skin, TF tolerance, Weight trends  REASON FOR ASSESSMENT:   Ventilator    ASSESSMENT:   Patient with PMH significant for OHS, OSA, chronic respiratory failure, DM, CAD, failed CABG 2014, CVA, CKD III-IV, and CHF. Presents this admission with respiratory failure likely related to acute compensation of CHF.   9/23- extubated   Pt discussed during ICU rounds and with RN.   Pt one-way extubated with plans to go home with hospice. Diet advanced by palliative to DYS 2 nectar thick liquids for comfort. Attempted to speak with pt at bedside, seemed intermittently confused. Unable to provide history PTA. Eager to start eating. RD to provide supplementation to maximize kcal and protein.   Admission weight: 173.3 kg Current weight: 172.4 kg   I/O: -5,139 ml since admit UOP: 3,775 ml x 24 hrs    Medications: SS novolog Labs: Na 146 (H) K 3.2 (L) CBG 128-255  Diet Order:   Diet Order            DIET DYS 2 Room service appropriate? Yes; Fluid consistency: Nectar Thick  Diet effective now              EDUCATION NEEDS:   Not appropriate for education at this time  Skin:  Skin Assessment: Skin Integrity Issues: Skin Integrity Issues:: Stage II, DTI DTI: sacrum Stage II: buttocks  Last BM:  9/23  Height:   Ht Readings from Last 1 Encounters:  05/12/19 5\' 11"  (1.803 m)    Weight:   Wt Readings from Last 1 Encounters:  05/14/19 (!) 172.4 kg    Ideal Body Weight:  78.2 kg  BMI:  Body mass index is 53 kg/m.  Estimated Nutritional Needs:    Kcal:  2100-2300 kcal  Protein:  105-120 grams  Fluid:  >/= 2.1 L/day   Mariana Single RD, LDN Clinical Nutrition Pager # - [phone]

## 2019-05-16 ENCOUNTER — Ambulatory Visit (HOSPITAL_COMMUNITY): Payer: HMO | Attending: Adult Health

## 2019-05-16 LAB — CBC
HCT: 37.3 % — ABNORMAL LOW (ref 39.0–52.0)
Hemoglobin: 10.5 g/dL — ABNORMAL LOW (ref 13.0–17.0)
MCH: 29.1 pg (ref 26.0–34.0)
MCHC: 28.2 g/dL — ABNORMAL LOW (ref 30.0–36.0)
MCV: 103.3 fL — ABNORMAL HIGH (ref 80.0–100.0)
Platelets: 212 10*3/uL (ref 150–400)
RBC: 3.61 MIL/uL — ABNORMAL LOW (ref 4.22–5.81)
RDW: 14.2 % (ref 11.5–15.5)
WBC: 7.5 10*3/uL (ref 4.0–10.5)
nRBC: 0 % (ref 0.0–0.2)

## 2019-05-16 LAB — BASIC METABOLIC PANEL
Anion gap: 8 (ref 5–15)
BUN: 36 mg/dL — ABNORMAL HIGH (ref 6–20)
CO2: 32 mmol/L (ref 22–32)
Calcium: 8.9 mg/dL (ref 8.9–10.3)
Chloride: 106 mmol/L (ref 98–111)
Creatinine, Ser: 2.46 mg/dL — ABNORMAL HIGH (ref 0.61–1.24)
GFR calc Af Amer: 32 mL/min — ABNORMAL LOW (ref >60)
GFR calc non Af Amer: 28 mL/min — ABNORMAL LOW (ref >60)
Glucose, Bld: 154 mg/dL — ABNORMAL HIGH (ref 70–99)
Potassium: 3.4 mmol/L — ABNORMAL LOW (ref 3.5–5.1)
Sodium: 146 mmol/L — ABNORMAL HIGH (ref 135–145)

## 2019-05-16 LAB — GLUCOSE, CAPILLARY
Glucose-Capillary: 129 mg/dL — ABNORMAL HIGH (ref 70–99)
Glucose-Capillary: 130 mg/dL — ABNORMAL HIGH (ref 70–99)
Glucose-Capillary: 140 mg/dL — ABNORMAL HIGH (ref 70–99)
Glucose-Capillary: 145 mg/dL — ABNORMAL HIGH (ref 70–99)
Glucose-Capillary: 149 mg/dL — ABNORMAL HIGH (ref 70–99)
Glucose-Capillary: 152 mg/dL — ABNORMAL HIGH (ref 70–99)
Glucose-Capillary: 156 mg/dL — ABNORMAL HIGH (ref 70–99)
Glucose-Capillary: 185 mg/dL — ABNORMAL HIGH (ref 70–99)

## 2019-05-16 LAB — MAGNESIUM: Magnesium: 2.3 mg/dL (ref 1.7–2.4)

## 2019-05-16 MED ORDER — HYPROMELLOSE (GONIOSCOPIC) 2.5 % OP SOLN
1.0000 [drp] | Freq: Three times a day (TID) | OPHTHALMIC | Status: DC | PRN
  Filled 2019-05-16: qty 15

## 2019-05-16 MED ORDER — HYPROMELLOSE (GONIOSCOPIC) 2.5 % OP SOLN
1.0000 [drp] | Freq: Three times a day (TID) | OPHTHALMIC | Status: DC
  Administered 2019-05-16 – 2019-06-05 (×60): 1 [drp] via OPHTHALMIC
  Filled 2019-05-16 (×2): qty 15

## 2019-05-16 MED ORDER — TORSEMIDE 20 MG PO TABS
80.0000 mg | ORAL_TABLET | Freq: Every day | ORAL | Status: DC
  Administered 2019-05-17 – 2019-05-27 (×11): 80 mg via ORAL
  Filled 2019-05-16 (×11): qty 4

## 2019-05-16 MED ORDER — POTASSIUM CHLORIDE CRYS ER 20 MEQ PO TBCR
20.0000 meq | EXTENDED_RELEASE_TABLET | Freq: Once | ORAL | Status: AC
  Administered 2019-05-16: 16:00:00 20 meq via ORAL
  Filled 2019-05-16: qty 1

## 2019-05-16 MED ORDER — ARTIFICIAL TEARS OPHTHALMIC OINT
TOPICAL_OINTMENT | Freq: Every day | OPHTHALMIC | Status: DC
  Administered 2019-05-16 – 2019-06-04 (×19): via OPHTHALMIC
  Filled 2019-05-16 (×3): qty 3.5

## 2019-05-16 NOTE — Progress Notes (Signed)
Palliative:  HPI: 57 y.o.malewith past medical history of diastolic CHF, CAD, HTN, MI, atrial fib, s/p CABG, stroke, OSA/OHS (does not use CPAP), diabetes, CKD stage 3-4, s/p acoustic neuroma resection with residual temporal neuralgiaadmitted on 9/21/2020with worsening SOB requiring intubation.Optimized with plans for one way extubation completed 9/23. Continuing conversations for possible hospice care and overall GOC. So far he is doing well off vent and utilizing trilogy machine that wife has brought from home.    I met again today with [name] and wife, [name]. [place] conversations continue to be very difficult as [name] is alert and conversant but very poor insight and understanding. He also becomes very fixated on certain things especially his medications and times he should receive medications. [name] tells me that he understands that he cannot go home and has agreed to consider [facility]. I do not believe he really understands hospice but I believe [name] does understands as she has known many people that have been at [facility].   They are hopeful to continue treatment and optimization but would like to consider transition to [facility] in the near future upon discharge. I know there are no beds available at this time. He is currently using trilogy machine and unlikely to be able to use this at hospice but they could likely assist with BiPAP or CPAP support. This would need to be discussed more with hospice for smooth transition of care. Goals also clarified to continue current treatments and home meds as well as trilogy BUT no escalation to ICU level care but would rather for comfort care if he further declines.   Exam: Alert, oriented but poor insight and focus. Less lethargic today. Obese. Breathing with no labor today, diminished. Abd soft.   Plan: - Appears they would like to transition to [facility] but hopeful to optimize him over the weekend prior to transition.  - No  escalation of care to ICU level and no re-intubation.  - Full comfort care if he declines further.   [place], NP Palliative Medicine Team Pager [phone] (Please see [email or web address] for schedule) Team Phone [phone]    Greater than 50%  of this time was spent counseling and coordinating care related to the above assessment and plan

## 2019-05-16 NOTE — Progress Notes (Signed)
  Speech Language Pathology Treatment: Dysphagia  Patient Details Name: [name]. MRN: [id number] DOB: 12-04-1960 Today's Date: 05/16/2019 Time: 7858-8502 SLP Time Calculation (min) (ACUTE ONLY): 39 min  Assessment / Plan / Recommendation Clinical Impression  Pt seen for ongoing swallowing treatment. Pt upgraded to dysphagia 2/NTL for comfort after discussion with PA.  Pt was wearing home trilogy machine on SLP arrival.  Pt was able to remove, and nursing assisted with repositioning equipment.  Pt had morning meal tray present.  SLP fed pt AM meal.  Pt tolerated chopped/ground consistency and purees.  There was infrequent delayed cough (x4 throughout meal).  Pt independently used suction to clear x1 following bite of magic cup.  Pt expressed distaste for nectar thick liquid.  Pt consumed thin liquids with breakfast with SLP without overt s/s of aspiration.  Frequent belching was noted; however, pt was drinking ginger ale.  Pt achieved adequate oral clearance with extra time. Pt did cue for another bite of food intermittently without achieving full oral clearance, but this did not result in increased pocketing, residue, or clinical s/s of aspiration.  Recommend continuing chopped/ground diet texture and advancing to thin liquid.    HPI HPI: Pt is a 58 yo male admitted with worsening SOB, found to hypercarbic and hypoxic. ETT 9/21/9/23. Pt has had multiple swallow evaluations in the past with all of them recommending thin liquids; mostly soft to regular solids. PMH OHS, OSA, chronic respiratory failure with hypoxia and hypercapnia, on baseline 2 to 3 L/min home oxygen, noncompliant with CPAP IDDM, Afib, CAD, failed CABG in 2014, CVA, CKD stage 3-4, and diastolic HF      SLP Plan  Continue with current plan of care       Recommendations  Diet recommendations: Dysphagia 2 (fine chop);Thin liquid Liquids provided via: Cup;Straw Medication Administration: Whole meds with  liquid Supervision: Staff to assist with self feeding Compensations: Slow rate;Small sips/bites Postural Changes and/or Swallow Maneuvers: Seated upright 90 degrees                Oral Care Recommendations: Oral care BID SLP Visit Diagnosis: Dysphagia, unspecified (R13.10) Plan: Continue with current plan of care       [place], [place], [place] Office: [phone] 05/16/2019, 10:19 AM

## 2019-05-16 NOTE — Progress Notes (Signed)
Patient transported to 5W-08. Trilogy machine and patient's personal hard candies with patient.

## 2019-05-16 NOTE — Progress Notes (Signed)
[name]. is a 58 y.o. male patient admitted from ED awake, alert - oriented  X 4 - no acute distress noted.  VSS - Blood pressure 114/70, pulse 80, temperature 98.7 F (37.1 C), temperature source Axillary, resp. rate (!) 22, height 5\' 11"  (1.803 m), weight (!) 172.4 kg, SpO2 97 %.    IV in place, occlusive dsg intact without redness.  Orientation to room, and floor completed with information packet given to patient/family.  Patient declined safety video at this time.  Admission INP armband ID verified with patient/family, and in place.    SR up x 2, fall assessment complete, with patient and family able to verbalize understanding of risk associated with falls, and verbalized understanding to call nsg before up out of bed.  Call light within reach, patient able to voice, and demonstrate understanding. Skin assessment completed with second RN and skin integrity documented.   Will cont to eval and treat per MD orders.  Howard Pouch, RN 05/16/2019 7:57 AM

## 2019-05-16 NOTE — Progress Notes (Signed)
PROGRESS NOTE    [name].  YIA:[id number] DOB: [birth year] DOA: 05/12/2019 PCP: Caren Macadam, MD   Brief Narrative: 58 year old with past medical history significant for obesity, OHS, OSA, chronic respiratory failure with hypoxia and hypercapnia baseline 2 to 3 L of oxygen at home, noncompliant with CPAP, insulin-dependent diabetic, A. fib, CAD, failed CABG 2014, CVA, CKD stage III 4, diastolic heart failure presents with 3 days of worsening shortness of breath found to be hypercapnic and hypoxic requiring endotracheal intubation and PCCM admitted patient.  Chest x-ray was consistent with pulmonary edema, PO2 at 78, pH 7.3    Assessment & Plan:   Active Problems:   Acute on chronic respiratory failure with hypoxia and hypercapnia (HCC)   Goals of care, counseling/discussion   Palliative care encounter   Dysphagia    1-Acute on chronic hypercapnic and hypoxic respiratory failure secondary to decompensated diastolic heart failure, morbid obesity, OSA/OHS. NIPPV no compliance This is patient's second intubation in a month. Patient was treated with IV Lexis. CM discussed with wife, patient was extubated, one-way extubation.  Patient was made DNR.  Palliative care was consulted for further goals of care. Continue with CPAP , triology  as tolerated Resume home dose torsemide.   2-Hypotension; thought to be related to sedation Stable.  3-history of chronic A. fib on Eliquis Continue with Eliquis and amiodarone.  Chronic kidney disease a stage III, hyperkalemia resolved. Cr range 2.6---2.7 Stable at 2.4. resume home dose torsemide.   Chronic lower extremity wounds: Present on admission. Continue with local care  Diabetes type 2: Continue with a sliding scale. Nutrition; will defer palliative care further conversation with patient and wife in regards risk of aspiration Stage II buttock decubitus 1 pressure injury present on admission local  care  Hypokalemia: Replete x1.  Pressure Injury 04/16/19 Buttocks Left;Lower Stage II -  Partial thickness loss of dermis presenting as a shallow open ulcer with a red, pink wound bed without slough. (Active)  04/16/19 1519  Location: Buttocks  Location Orientation: Left;Lower  Staging: Stage II -  Partial thickness loss of dermis presenting as a shallow open ulcer with a red, pink wound bed without slough.  Wound Description (Comments):   Present on Admission:      Nutrition Problem: Increased nutrient needs Etiology: acute illness    Signs/Symptoms: estimated needs    Interventions: Prostat, Tube feeding  Estimated body mass index is 53 kg/m as calculated from the following:   Height as of this encounter: 5\' 11"  (1.803 m).   Weight as of this encounter: 172.4 kg.   DVT prophylaxis: Eliquis Code Status: DNR Family Communication: Wife over the phone 9/24 Disposition Plan: continue conversation with patient and family in regards disposition  Consultants:   CCM  Palliative care  Procedures:   None  Antimicrobials:  None  Subjective: He is still complaining of SOB.   Objective: Vitals:   05/16/19 0456 05/16/19 0503 05/16/19 0858 05/16/19 1302  BP:  114/70  109/61  Pulse:  80  91  Resp:    18  Temp:  98.7 F (37.1 C)  97.7 F (36.5 C)  TempSrc:  Axillary  Oral  SpO2: 98% 97% 91% 90%  Weight:      Height:        Intake/Output Summary (Last 24 hours) at 05/16/2019 1437 Last data filed at 05/16/2019 0854 Gross per 24 hour  Intake 370 ml  Output 900 ml  Net -530 ml   [employer]  05/12/19 1134 05/13/19 0440 05/14/19 0345  Weight: (!) 170 kg (!) 173.3 kg (!) 172.4 kg    Examination:  General exam: Morbid obese Respiratory system: Crackles bases Cardiovascular system: S 1, S 2 RRR Gastrointestinal system: BS present, soft, nt Central nervous system: alert, following command Extremities: dressing BL extremities   Data Reviewed: I have  personally reviewed following labs and imaging studies  CBC: Recent Labs  Lab 05/12/19 1018 05/12/19 1222 05/13/19 0237 05/14/19 0511 05/15/19 0619 05/16/19 0257  WBC 7.7  --  8.1 7.4 9.1 7.5  NEUTROABS 4.7  --   --   --   --   --   HGB 11.3* 12.6* 10.8* 10.1* 10.5* 10.5*  HCT 40.1 37.0* 34.8* 33.4* 36.0* 37.3*  MCV 105.2*  --  98.9 99.4 102.6* 103.3*  PLT 184  --  224 225 190 537   Basic Metabolic Panel: Recent Labs  Lab 05/13/19 0237 05/13/19 1841 05/14/19 0511 05/14/19 1241 05/14/19 1806 05/15/19 0619 05/16/19 0257 05/16/19 0744  NA 143  --  143 145  --  146*  --  146*  K 3.9  --  2.8* 3.7  --  3.2*  --  3.4*  CL 102  --  105 106  --  106  --  106  CO2 28  --  27 28  --  27  --  32  GLUCOSE 81  --  171* 250*  --  160*  --  154*  BUN 36*  --  43* 46*  --  42*  --  36*  CREATININE 2.87*  --  3.22* 3.33*  --  2.95*  --  2.46*  CALCIUM 9.1  --  7.9* 8.3*  --  8.5*  --  8.9  MG 1.9  --  2.1  --   --  2.1 2.3  --   PHOS  --  1.9* 3.5 3.4 3.8 4.1  --   --    GFR: Estimated Creatinine Clearance: 52.8 mL/min (A) (by C-G formula based on SCr of 2.46 mg/dL (H)). Liver Function Tests: Recent Labs  Lab 05/12/19 1018 05/13/19 0237 05/14/19 0511 05/15/19 0619  AST 35 29 15 14*  ALT 25 21 14 15   ALKPHOS 413* 340* 300* 330*  BILITOT 1.2 1.3* 1.2 0.9  PROT 7.1 6.4* 5.9* 6.3*  ALBUMIN 3.1* 2.8* 2.3* 2.6*   No results for input(s): LIPASE, AMYLASE in the last 168 hours. No results for input(s): AMMONIA in the last 168 hours. Coagulation Profile: Recent Labs  Lab 05/12/19 1018  INR 1.5*   Cardiac Enzymes: No results for input(s): CKTOTAL, CKMB, CKMBINDEX, TROPONINI in the last 168 hours. BNP (last 3 results) No results for input(s): PROBNP in the last 8760 hours. HbA1C: No results for input(s): HGBA1C in the last 72 hours. CBG: Recent Labs  Lab 05/15/19 2026 05/16/19 0015 05/16/19 0413 05/16/19 0813 05/16/19 1143  GLUCAP 183* 140* 152* 149* 185*   Lipid  Profile: Recent Labs    05/14/19 0511 05/15/19 0619  TRIG 356* 123   Thyroid Function Tests: No results for input(s): TSH, T4TOTAL, FREET4, T3FREE, THYROIDAB in the last 72 hours. Anemia Panel: No results for input(s): VITAMINB12, FOLATE, FERRITIN, TIBC, IRON, RETICCTPCT in the last 72 hours. Sepsis Labs: Recent Labs  Lab 05/12/19 1018 05/12/19 1629  PROCALCITON  --  0.18  LATICACIDVEN 0.8  --     Recent Results (from the past 240 hour(s))  Blood Culture (routine x 2)  Status: Abnormal   Collection Time: 05/12/19 10:50 AM   Specimen: BLOOD RIGHT HAND  Result Value Ref Range Status   Specimen Description BLOOD RIGHT HAND  Final   Special Requests   Final    BOTTLES DRAWN AEROBIC AND ANAEROBIC Blood Culture adequate volume   Culture  Setup Time   Final    GRAM POSITIVE COCCI IN BOTH AEROBIC AND ANAEROBIC BOTTLES CRITICAL RESULT CALLED TO, READ BACK BY AND VERIFIED WITH: Hughie Closs, PHARMD AT [phone] ON 05/13/19 BY C. JESSUP, MT.    Culture (A)  Final    STAPHYLOCOCCUS SPECIES (COAGULASE NEGATIVE) THE SIGNIFICANCE OF ISOLATING THIS ORGANISM FROM A SINGLE SET OF BLOOD CULTURES WHEN MULTIPLE SETS ARE DRAWN IS UNCERTAIN. PLEASE NOTIFY THE MICROBIOLOGY DEPARTMENT WITHIN ONE WEEK IF SPECIATION AND SENSITIVITIES ARE REQUIRED. Performed at [facility], [place] [address] [place], [place] [address]    Report Status 05/15/2019 FINAL  Final  Blood Culture ID Panel (Reflexed)     Status: Abnormal   Collection Time: 05/12/19 10:50 AM  Result Value Ref Range Status   Enterococcus species NOT DETECTED NOT DETECTED Final   Listeria monocytogenes NOT DETECTED NOT DETECTED Final   Staphylococcus species DETECTED (A) NOT DETECTED Final    Comment: Methicillin (oxacillin) resistant coagulase negative staphylococcus. Possible blood culture contaminant (unless isolated from more than one blood culture draw or clinical case suggests pathogenicity). No antibiotic treatment is indicated for blood   culture contaminants. CRITICAL RESULT CALLED TO, READ BACK BY AND VERIFIED WITH: Hughie Closs, PHARMD AT [phone] ON 05/13/19 BY  C. JESSUP, MT.    Staphylococcus aureus (BCID) NOT DETECTED NOT DETECTED Final   Methicillin resistance DETECTED (A) NOT DETECTED Final    Comment: CRITICAL RESULT CALLED TO, READ BACK BY AND VERIFIED WITH: Hughie Closs, PHARMD AT [phone] ON 05/13/19 BY C. JESSUP, MT.    Streptococcus species NOT DETECTED NOT DETECTED Final   Streptococcus agalactiae NOT DETECTED NOT DETECTED Final   Streptococcus pneumoniae NOT DETECTED NOT DETECTED Final   Streptococcus pyogenes NOT DETECTED NOT DETECTED Final   Acinetobacter baumannii NOT DETECTED NOT DETECTED Final   Enterobacteriaceae species NOT DETECTED NOT DETECTED Final   Enterobacter cloacae complex NOT DETECTED NOT DETECTED Final   Escherichia coli NOT DETECTED NOT DETECTED Final   Klebsiella oxytoca NOT DETECTED NOT DETECTED Final   Klebsiella pneumoniae NOT DETECTED NOT DETECTED Final   Proteus species NOT DETECTED NOT DETECTED Final   Serratia marcescens NOT DETECTED NOT DETECTED Final   Haemophilus influenzae NOT DETECTED NOT DETECTED Final   Neisseria meningitidis NOT DETECTED NOT DETECTED Final   Pseudomonas aeruginosa NOT DETECTED NOT DETECTED Final   Candida albicans NOT DETECTED NOT DETECTED Final   Candida glabrata NOT DETECTED NOT DETECTED Final   Candida krusei NOT DETECTED NOT DETECTED Final   Candida parapsilosis NOT DETECTED NOT DETECTED Final   Candida tropicalis NOT DETECTED NOT DETECTED Final    Comment: Performed at [facility], [place]. [address] [place], [place] [address]  SARS Coronavirus [facility] order, Performed in [facility] hospital lab) Nasopharyngeal Nasopharyngeal Swab     Status: None   Collection Time: 05/12/19 11:01 AM   Specimen: Nasopharyngeal Swab  Result Value Ref Range Status   SARS Coronavirus 2 NEGATIVE NEGATIVE Final    Comment: (NOTE) If result is NEGATIVE SARS-CoV-2  target nucleic acids are NOT DETECTED. The SARS-CoV-2 RNA is generally detectable in upper and lower  respiratory specimens during the acute phase of  infection. The lowest  concentration of SARS-CoV-2 viral copies this assay can detect is 250  copies / mL. A negative result does not preclude SARS-CoV-2 infection  and should not be used as the sole basis for treatment or other  patient management decisions.  A negative result may occur with  improper specimen collection / handling, submission of specimen other  than nasopharyngeal swab, presence of viral mutation(s) within the  areas targeted by this assay, and inadequate number of viral copies  (<250 copies / mL). A negative result must be combined with clinical  observations, patient history, and epidemiological information. If result is POSITIVE SARS-CoV-2 target nucleic acids are DETECTED. The SARS-CoV-2 RNA is generally detectable in upper and lower  respiratory specimens dur ing the acute phase of infection.  Positive  results are indicative of active infection with SARS-CoV-2.  Clinical  correlation with patient history and other diagnostic information is  necessary to determine patient infection status.  Positive results do  not rule out bacterial infection or co-infection with other viruses. If result is PRESUMPTIVE POSTIVE SARS-CoV-2 nucleic acids MAY BE PRESENT.   A presumptive positive result was obtained on the submitted specimen  and confirmed on repeat testing.  While 2019 novel coronavirus  (SARS-CoV-2) nucleic acids may be present in the submitted sample  additional confirmatory testing may be necessary for epidemiological  and / or clinical management purposes  to differentiate between  SARS-CoV-2 and other Sarbecovirus currently known to infect humans.  If clinically indicated additional testing with an alternate test  methodology [phone]) is advised. The SARS-CoV-2 RNA is generally  detectable in upper and lower  respiratory sp ecimens during the acute  phase of infection. The expected result is Negative. Fact Sheet for Patients:  StrictlyIdeas.no Fact Sheet for Healthcare Providers: BankingDealers.co.za This test is not yet approved or cleared by the [place] FDA and has been authorized for detection and/or diagnosis of SARS-CoV-2 by FDA under an Emergency Use Authorization (EUA).  This EUA will remain in effect (meaning this test can be used) for the duration of the COVID-19 declaration under Section 564(b)(1) of the Act, 21 U.S.C. section 360bbb-3(b)(1), unless the authorization is terminated or revoked sooner. Performed at [facility], [place] [address], [place], [place] [address]   Urine culture     Status: Abnormal   Collection Time: 05/12/19  2:49 PM   Specimen: In/Out Cath Urine  Result Value Ref Range Status   Specimen Description IN/OUT CATH URINE  Final   Special Requests   Final    NONE Performed at [facility], [place] [address], [place], [place] [address]    Culture 60,000 COLONIES/mL PROTEUS PENNERI (A)  Final   Report Status 05/15/2019 FINAL  Final   Organism ID, Bacteria PROTEUS PENNERI (A)  Final      Susceptibility   Proteus penneri - MIC*    AMPICILLIN >=32 RESISTANT Resistant     CEFAZOLIN >=64 RESISTANT Resistant     CEFTRIAXONE >=64 RESISTANT Resistant     CIPROFLOXACIN >=4 RESISTANT Resistant     GENTAMICIN <=1 SENSITIVE Sensitive     IMIPENEM <=0.25 SENSITIVE Sensitive     NITROFURANTOIN 256 RESISTANT Resistant     TRIMETH/SULFA <=20 SENSITIVE Sensitive     AMPICILLIN/SULBACTAM >=32 RESISTANT Resistant     PIP/TAZO <=4 SENSITIVE Sensitive     * 60,000 COLONIES/mL PROTEUS PENNERI  Blood Culture (routine x 2)     Status: None (Preliminary result)   Collection Time:  05/12/19  4:29 PM   Specimen: BLOOD RIGHT HAND  Result Value Ref Range Status   Specimen Description BLOOD RIGHT HAND  Final   Special  Requests   Final    BOTTLES DRAWN AEROBIC ONLY Blood Culture results may not be optimal due to an inadequate volume of blood received in culture bottles   Culture   Final    NO GROWTH 4 DAYS Performed at [facility], [place] [address], [place], [place] [address]    Report Status PENDING  Incomplete  MRSA PCR Screening     Status: None   Collection Time: 05/12/19  4:47 PM   Specimen: Nasopharyngeal  Result Value Ref Range Status   MRSA by PCR NEGATIVE NEGATIVE Final    Comment:        The GeneXpert MRSA Assay (FDA approved for NASAL specimens only), is one component of a comprehensive MRSA colonization surveillance program. It is not intended to diagnose MRSA infection nor to guide or monitor treatment for MRSA infections. Performed at [facility], [place] [address] [place],  [address]          Radiology Studies: No results found.      Scheduled Meds:  amiodarone  100 mg Oral Daily   apixaban  5 mg Oral BID   Chlorhexidine Gluconate Cloth  6 each Topical Daily   guaiFENesin  600 mg Oral BID   insulin aspart  0-15 Units Subcutaneous Q4H   ipratropium  0.5 mg Nebulization TID   levalbuterol  0.63 mg Nebulization TID   mouth rinse  15 mL Mouth Rinse BID   pantoprazole (PROTONIX) IV  40 mg Intravenous QHS   rosuvastatin  20 mg Oral q1800   sodium chloride flush  10-40 mL Intracatheter Q12H   topiramate  100 mg Oral QHS   Continuous Infusions:  phenylephrine (NEO-SYNEPHRINE) Adult infusion Stopped (05/14/19 1007)     LOS: 4 days    Time spent: 35 minutes.     Elmarie Shiley, MD [facility] Pager [phone]  If 7PM-7AM, please contact night-coverage [email or web address] Password [facility] 05/16/2019, 2:37 PM

## 2019-05-17 LAB — BASIC METABOLIC PANEL
Anion gap: 9 (ref 5–15)
BUN: 29 mg/dL — ABNORMAL HIGH (ref 6–20)
CO2: 29 mmol/L (ref 22–32)
Calcium: 8.8 mg/dL — ABNORMAL LOW (ref 8.9–10.3)
Chloride: 106 mmol/L (ref 98–111)
Creatinine, Ser: 2.16 mg/dL — ABNORMAL HIGH (ref 0.61–1.24)
GFR calc Af Amer: 38 mL/min — ABNORMAL LOW (ref >60)
GFR calc non Af Amer: 33 mL/min — ABNORMAL LOW (ref >60)
Glucose, Bld: 169 mg/dL — ABNORMAL HIGH (ref 70–99)
Potassium: 3.5 mmol/L (ref 3.5–5.1)
Sodium: 144 mmol/L (ref 135–145)

## 2019-05-17 LAB — GLUCOSE, CAPILLARY
Glucose-Capillary: 175 mg/dL — ABNORMAL HIGH (ref 70–99)
Glucose-Capillary: 172 mg/dL — ABNORMAL HIGH (ref 70–99)
Glucose-Capillary: 179 mg/dL — ABNORMAL HIGH (ref 70–99)
Glucose-Capillary: 158 mg/dL — ABNORMAL HIGH (ref 70–99)
Glucose-Capillary: 132 mg/dL — ABNORMAL HIGH (ref 70–99)

## 2019-05-17 LAB — CULTURE, BLOOD (ROUTINE X 2): Culture: NO GROWTH

## 2019-05-17 MED ORDER — BUDESONIDE 0.25 MG/2ML IN SUSP
0.2500 mg | Freq: Two times a day (BID) | RESPIRATORY_TRACT | Status: DC
  Administered 2019-05-17 – 2019-06-02 (×30): 0.25 mg via RESPIRATORY_TRACT
  Filled 2019-05-17 (×36): qty 2

## 2019-05-17 NOTE — Progress Notes (Addendum)
PROGRESS NOTE    [name].  WUJ:[id number] DOB: 1961-06-23 DOA: 05/12/2019 PCP: Caren Macadam, MD   Brief Narrative: 58 year old with past medical history significant for obesity, OHS, OSA, chronic respiratory failure with hypoxia and hypercapnia baseline 2 to 3 L of oxygen at home, noncompliant with CPAP, insulin-dependent diabetic, A. fib, CAD, failed CABG 2014, CVA, CKD stage III 4, diastolic heart failure presents with 3 days of worsening shortness of breath found to be hypercapnic and hypoxic requiring endotracheal intubation and PCCM admitted patient.  Chest x-ray was consistent with pulmonary edema, PO2 at 78, pH 7.3    Assessment & Plan:   Active Problems:   Acute on chronic respiratory failure with hypoxia and hypercapnia (HCC)   Goals of care, counseling/discussion   Palliative care encounter   Dysphagia    1-Acute on chronic hypercapnic and hypoxic respiratory failure secondary to decompensated diastolic heart failure, morbid obesity, OSA/OHS. NIPPV no compliance This is patient's second intubation in a month. Patient was treated with IV Lexis. CM discussed with wife, patient was extubated, one-way extubation.  Patient was made DNR.  Palliative care was consulted for further goals of care. Continue with CPAP , triology  as tolerated Resume lower home dose torsemide. Good urine out put 2.5 L yestreday Has allergies to prednisone. Continue with diuretics.   2-Hypotension; thought to be related to sedation Stable.  3-history of chronic A. fib on Eliquis Continue with Eliquis and amiodarone.  Chronic kidney disease a stage III, hyperkalemia resolved. Cr range 2.6---2.7 Stable at 2.4. resume home dose torsemide.   Chronic lower extremity wounds: Present on admission. Continue with local care  Diabetes type 2: Continue with a sliding scale. Nutrition; will defer palliative care further conversation with patient and wife in regards risk of  aspiration Stage II buttock decubitus 1 pressure injury present on admission local care  Hypokalemia: Replete x1.  Pressure Injury 04/16/19 Buttocks Left;Lower Stage II -  Partial thickness loss of dermis presenting as a shallow open ulcer with a red, pink wound bed without slough. (Active)  04/16/19 1519  Location: Buttocks  Location Orientation: Left;Lower  Staging: Stage II -  Partial thickness loss of dermis presenting as a shallow open ulcer with a red, pink wound bed without slough.  Wound Description (Comments):   Present on Admission:      Nutrition Problem: Increased nutrient needs Etiology: acute illness    Signs/Symptoms: estimated needs    Interventions: Prostat, Tube feeding  Estimated body mass index is 51.47 kg/m as calculated from the following:   Height as of this encounter: 5\' 11"  (1.803 m).   Weight as of this encounter: 167.4 kg.   DVT prophylaxis: Eliquis Code Status: DNR Family Communication: Wife over the phone 9/24 Disposition Plan: continue conversation with patient and family in regards disposition  Consultants:   CCM  Palliative care  Procedures:   None  Antimicrobials:  None  Subjective: He is still complaining of SOB.   Objective: Vitals:   05/17/19 0530 05/17/19 0717 05/17/19 1229 05/17/19 1407  BP: (!) 149/56  (!) 147/68   Pulse: 92  80   Resp: 18  19   Temp: 97.7 F (36.5 C)  98.2 F (36.8 C)   TempSrc:   Oral   SpO2: 96% 92% 99% 92%  Weight: (!) 167.4 kg     Height:        Intake/Output Summary (Last 24 hours) at 05/17/2019 1414 Last data filed at 05/17/2019 0900 Gross per  24 hour  Intake 500 ml  Output 2450 ml  Net -1950 ml   Filed Weights   05/13/19 0440 05/14/19 0345 05/17/19 0530  Weight: (!) 173.3 kg (!) 172.4 kg (!) 167.4 kg    Examination:  General exam: Morbid obese.  Respiratory system: Bilateral wheezing Cardiovascular system: S 1, S 2 RRR Gastrointestinal system: BS present, soft, nt Central  nervous system: Alert, following command Extremities: BL with dressing   Data Reviewed: I have personally reviewed following labs and imaging studies  CBC: Recent Labs  Lab 05/12/19 1018 05/12/19 1222 05/13/19 0237 05/14/19 0511 05/15/19 0619 05/16/19 0257  WBC 7.7  --  8.1 7.4 9.1 7.5  NEUTROABS 4.7  --   --   --   --   --   HGB 11.3* 12.6* 10.8* 10.1* 10.5* 10.5*  HCT 40.1 37.0* 34.8* 33.4* 36.0* 37.3*  MCV 105.2*  --  98.9 99.4 102.6* 103.3*  PLT 184  --  224 225 190 786   Basic Metabolic Panel: Recent Labs  Lab 05/13/19 0237 05/13/19 1841 05/14/19 0511 05/14/19 1241 05/14/19 1806 05/15/19 0619 05/16/19 0257 05/16/19 0744 05/17/19 0327  NA 143  --  143 145  --  146*  --  146* 144  K 3.9  --  2.8* 3.7  --  3.2*  --  3.4* 3.5  CL 102  --  105 106  --  106  --  106 106  CO2 28  --  27 28  --  27  --  32 29  GLUCOSE 81  --  171* 250*  --  160*  --  154* 169*  BUN 36*  --  43* 46*  --  42*  --  36* 29*  CREATININE 2.87*  --  3.22* 3.33*  --  2.95*  --  2.46* 2.16*  CALCIUM 9.1  --  7.9* 8.3*  --  8.5*  --  8.9 8.8*  MG 1.9  --  2.1  --   --  2.1 2.3  --   --   PHOS  --  1.9* 3.5 3.4 3.8 4.1  --   --   --    GFR: Estimated Creatinine Clearance: 59.1 mL/min (A) (by C-G formula based on SCr of 2.16 mg/dL (H)). Liver Function Tests: Recent Labs  Lab 05/12/19 1018 05/13/19 0237 05/14/19 0511 05/15/19 0619  AST 35 29 15 14*  ALT 25 21 14 15   ALKPHOS 413* 340* 300* 330*  BILITOT 1.2 1.3* 1.2 0.9  PROT 7.1 6.4* 5.9* 6.3*  ALBUMIN 3.1* 2.8* 2.3* 2.6*   No results for input(s): LIPASE, AMYLASE in the last 168 hours. No results for input(s): AMMONIA in the last 168 hours. Coagulation Profile: Recent Labs  Lab 05/12/19 1018  INR 1.5*   Cardiac Enzymes: No results for input(s): CKTOTAL, CKMB, CKMBINDEX, TROPONINI in the last 168 hours. BNP (last 3 results) No results for input(s): PROBNP in the last 8760 hours. HbA1C: No results for input(s): HGBA1C in the  last 72 hours. CBG: Recent Labs  Lab 05/16/19 2134 05/16/19 2349 05/17/19 0412 05/17/19 0814 05/17/19 1230  GLUCAP 145* 129* 175* 172* 179*   Lipid Profile: Recent Labs    05/15/19 0619  TRIG 123   Thyroid Function Tests: No results for input(s): TSH, T4TOTAL, FREET4, T3FREE, THYROIDAB in the last 72 hours. Anemia Panel: No results for input(s): VITAMINB12, FOLATE, FERRITIN, TIBC, IRON, RETICCTPCT in the last 72 hours. Sepsis Labs: Recent Labs  Lab 05/12/19 1018  05/12/19 1629  PROCALCITON  --  0.18  LATICACIDVEN 0.8  --     Recent Results (from the past 240 hour(s))  Blood Culture (routine x 2)     Status: Abnormal   Collection Time: 05/12/19 10:50 AM   Specimen: BLOOD RIGHT HAND  Result Value Ref Range Status   Specimen Description BLOOD RIGHT HAND  Final   Special Requests   Final    BOTTLES DRAWN AEROBIC AND ANAEROBIC Blood Culture adequate volume   Culture  Setup Time   Final    GRAM POSITIVE COCCI IN BOTH AEROBIC AND ANAEROBIC BOTTLES CRITICAL RESULT CALLED TO, READ BACK BY AND VERIFIED WITH: Hughie Closs, PHARMD AT [phone] ON 05/13/19 BY C. JESSUP, MT.    Culture (A)  Final    STAPHYLOCOCCUS SPECIES (COAGULASE NEGATIVE) THE SIGNIFICANCE OF ISOLATING THIS ORGANISM FROM A SINGLE SET OF BLOOD CULTURES WHEN MULTIPLE SETS ARE DRAWN IS UNCERTAIN. PLEASE NOTIFY THE MICROBIOLOGY DEPARTMENT WITHIN ONE WEEK IF SPECIATION AND SENSITIVITIES ARE REQUIRED. Performed at [facility], [place] [address] [place], [place] [address]    Report Status 05/15/2019 FINAL  Final  Blood Culture ID Panel (Reflexed)     Status: Abnormal   Collection Time: 05/12/19 10:50 AM  Result Value Ref Range Status   Enterococcus species NOT DETECTED NOT DETECTED Final   Listeria monocytogenes NOT DETECTED NOT DETECTED Final   Staphylococcus species DETECTED (A) NOT DETECTED Final    Comment: Methicillin (oxacillin) resistant coagulase negative staphylococcus. Possible blood culture contaminant  (unless isolated from more than one blood culture draw or clinical case suggests pathogenicity). No antibiotic treatment is indicated for blood  culture contaminants. CRITICAL RESULT CALLED TO, READ BACK BY AND VERIFIED WITH: Hughie Closs, PHARMD AT [phone] ON 05/13/19 BY  C. JESSUP, MT.    Staphylococcus aureus (BCID) NOT DETECTED NOT DETECTED Final   Methicillin resistance DETECTED (A) NOT DETECTED Final    Comment: CRITICAL RESULT CALLED TO, READ BACK BY AND VERIFIED WITH: Hughie Closs, PHARMD AT [phone] ON 05/13/19 BY C. JESSUP, MT.    Streptococcus species NOT DETECTED NOT DETECTED Final   Streptococcus agalactiae NOT DETECTED NOT DETECTED Final   Streptococcus pneumoniae NOT DETECTED NOT DETECTED Final   Streptococcus pyogenes NOT DETECTED NOT DETECTED Final   Acinetobacter baumannii NOT DETECTED NOT DETECTED Final   Enterobacteriaceae species NOT DETECTED NOT DETECTED Final   Enterobacter cloacae complex NOT DETECTED NOT DETECTED Final   Escherichia coli NOT DETECTED NOT DETECTED Final   Klebsiella oxytoca NOT DETECTED NOT DETECTED Final   Klebsiella pneumoniae NOT DETECTED NOT DETECTED Final   Proteus species NOT DETECTED NOT DETECTED Final   Serratia marcescens NOT DETECTED NOT DETECTED Final   Haemophilus influenzae NOT DETECTED NOT DETECTED Final   Neisseria meningitidis NOT DETECTED NOT DETECTED Final   Pseudomonas aeruginosa NOT DETECTED NOT DETECTED Final   Candida albicans NOT DETECTED NOT DETECTED Final   Candida glabrata NOT DETECTED NOT DETECTED Final   Candida krusei NOT DETECTED NOT DETECTED Final   Candida parapsilosis NOT DETECTED NOT DETECTED Final   Candida tropicalis NOT DETECTED NOT DETECTED Final    Comment: Performed at [facility], [place]. [address], [place], [place] [address]  SARS Coronavirus 2 [facility] order, Performed in [facility] hospital lab) Nasopharyngeal Nasopharyngeal Swab     Status: None   Collection Time: 05/12/19 11:01 AM   Specimen: Nasopharyngeal  Swab  Result Value Ref Range Status   SARS Coronavirus 2 NEGATIVE NEGATIVE Final  Comment: (NOTE) If result is NEGATIVE SARS-CoV-2 target nucleic acids are NOT DETECTED. The SARS-CoV-2 RNA is generally detectable in upper and lower  respiratory specimens during the acute phase of infection. The lowest  concentration of SARS-CoV-2 viral copies this assay can detect is 250  copies / mL. A negative result does not preclude SARS-CoV-2 infection  and should not be used as the sole basis for treatment or other  patient management decisions.  A negative result may occur with  improper specimen collection / handling, submission of specimen other  than nasopharyngeal swab, presence of viral mutation(s) within the  areas targeted by this assay, and inadequate number of viral copies  (<250 copies / mL). A negative result must be combined with clinical  observations, patient history, and epidemiological information. If result is POSITIVE SARS-CoV-2 target nucleic acids are DETECTED. The SARS-CoV-2 RNA is generally detectable in upper and lower  respiratory specimens dur ing the acute phase of infection.  Positive  results are indicative of active infection with SARS-CoV-2.  Clinical  correlation with patient history and other diagnostic information is  necessary to determine patient infection status.  Positive results do  not rule out bacterial infection or co-infection with other viruses. If result is PRESUMPTIVE POSTIVE SARS-CoV-2 nucleic acids MAY BE PRESENT.   A presumptive positive result was obtained on the submitted specimen  and confirmed on repeat testing.  While 2019 novel coronavirus  (SARS-CoV-2) nucleic acids may be present in the submitted sample  additional confirmatory testing may be necessary for epidemiological  and / or clinical management purposes  to differentiate between  SARS-CoV-2 and other Sarbecovirus currently known to infect humans.  If clinically indicated  additional testing with an alternate test  methodology [phone]) is advised. The SARS-CoV-2 RNA is generally  detectable in upper and lower respiratory sp ecimens during the acute  phase of infection. The expected result is Negative. Fact Sheet for Patients:  StrictlyIdeas.no Fact Sheet for Healthcare Providers: BankingDealers.co.za This test is not yet approved or cleared by the [place] FDA and has been authorized for detection and/or diagnosis of SARS-CoV-2 by FDA under an Emergency Use Authorization (EUA).  This EUA will remain in effect (meaning this test can be used) for the duration of the COVID-19 declaration under Section 564(b)(1) of the Act, 21 U.S.C. section 360bbb-3(b)(1), unless the authorization is terminated or revoked sooner. Performed at [facility], [place] [address], [place], [place] [address]   Urine culture     Status: Abnormal   Collection Time: 05/12/19  2:49 PM   Specimen: In/Out Cath Urine  Result Value Ref Range Status   Specimen Description IN/OUT CATH URINE  Final   Special Requests   Final    NONE Performed at [facility], [place] [address] [place], [place] [address]    Culture 60,000 COLONIES/mL PROTEUS PENNERI (A)  Final   Report Status 05/15/2019 FINAL  Final   Organism ID, Bacteria PROTEUS PENNERI (A)  Final      Susceptibility   Proteus penneri - MIC*    AMPICILLIN >=32 RESISTANT Resistant     CEFAZOLIN >=64 RESISTANT Resistant     CEFTRIAXONE >=64 RESISTANT Resistant     CIPROFLOXACIN >=4 RESISTANT Resistant     GENTAMICIN <=1 SENSITIVE Sensitive     IMIPENEM <=0.25 SENSITIVE Sensitive     NITROFURANTOIN 256 RESISTANT Resistant     TRIMETH/SULFA <=20 SENSITIVE Sensitive     AMPICILLIN/SULBACTAM >=32 RESISTANT Resistant  PIP/TAZO <=4 SENSITIVE Sensitive     * 60,000 COLONIES/mL PROTEUS PENNERI  Blood Culture (routine x 2)     Status: None   Collection Time: 05/12/19  4:29 PM     Specimen: BLOOD RIGHT HAND  Result Value Ref Range Status   Specimen Description BLOOD RIGHT HAND  Final   Special Requests   Final    BOTTLES DRAWN AEROBIC ONLY Blood Culture results may not be optimal due to an inadequate volume of blood received in culture bottles   Culture   Final    NO GROWTH 5 DAYS Performed at [facility], [place] [address] [place], [place] [address]    Report Status 05/17/2019 FINAL  Final  MRSA PCR Screening     Status: None   Collection Time: 05/12/19  4:47 PM   Specimen: Nasopharyngeal  Result Value Ref Range Status   MRSA by PCR NEGATIVE NEGATIVE Final    Comment:        The GeneXpert MRSA Assay (FDA approved for NASAL specimens only), is one component of a comprehensive MRSA colonization surveillance program. It is not intended to diagnose MRSA infection nor to guide or monitor treatment for MRSA infections. Performed at [facility], [place] [address], [place], [place] [address]          Radiology Studies: No results found.      Scheduled Meds:  amiodarone  100 mg Oral Daily   apixaban  5 mg Oral BID   artificial tears   Both Eyes QHS   Chlorhexidine Gluconate Cloth  6 each Topical Daily   guaiFENesin  600 mg Oral BID   hydroxypropyl methylcellulose / hypromellose  1 drop Both Eyes TID   insulin aspart  0-15 Units Subcutaneous Q4H   ipratropium  0.5 mg Nebulization TID   levalbuterol  0.63 mg Nebulization TID   mouth rinse  15 mL Mouth Rinse BID   pantoprazole (PROTONIX) IV  40 mg Intravenous QHS   rosuvastatin  20 mg Oral q1800   sodium chloride flush  10-40 mL Intracatheter Q12H   topiramate  100 mg Oral QHS   torsemide  80 mg Oral Daily   Continuous Infusions:    LOS: 5 days    Time spent: 35 minutes.     Elmarie Shiley, MD [facility] Pager [phone]  If 7PM-7AM, please contact night-coverage [email or web address] Password [facility] 05/17/2019, 2:14 PM

## 2019-05-18 LAB — GLUCOSE, CAPILLARY
Glucose-Capillary: 142 mg/dL — ABNORMAL HIGH (ref 70–99)
Glucose-Capillary: 145 mg/dL — ABNORMAL HIGH (ref 70–99)
Glucose-Capillary: 162 mg/dL — ABNORMAL HIGH (ref 70–99)
Glucose-Capillary: 180 mg/dL — ABNORMAL HIGH (ref 70–99)
Glucose-Capillary: 192 mg/dL — ABNORMAL HIGH (ref 70–99)
Glucose-Capillary: 210 mg/dL — ABNORMAL HIGH (ref 70–99)

## 2019-05-18 MED ORDER — PANTOPRAZOLE SODIUM 40 MG PO TBEC
40.0000 mg | DELAYED_RELEASE_TABLET | Freq: Every day | ORAL | Status: DC
  Administered 2019-05-18 – 2019-06-04 (×18): 40 mg via ORAL
  Filled 2019-05-18 (×18): qty 1

## 2019-05-18 MED ORDER — IPRATROPIUM BROMIDE 0.02 % IN SOLN
0.5000 mg | Freq: Two times a day (BID) | RESPIRATORY_TRACT | Status: DC
  Administered 2019-05-18 – 2019-06-05 (×34): 0.5 mg via RESPIRATORY_TRACT
  Filled 2019-05-18 (×37): qty 2.5

## 2019-05-18 MED ORDER — LEVALBUTEROL HCL 0.63 MG/3ML IN NEBU
0.6300 mg | INHALATION_SOLUTION | Freq: Two times a day (BID) | RESPIRATORY_TRACT | Status: DC
  Administered 2019-05-18 – 2019-06-05 (×34): 0.63 mg via RESPIRATORY_TRACT
  Filled 2019-05-18 (×38): qty 3

## 2019-05-18 NOTE — Progress Notes (Signed)
PROGRESS NOTE    [name].  QQP:[id number] DOB: [birth year] DOA: 05/12/2019 PCP: Caren Macadam, MD   Brief Narrative: 58 year old with past medical history significant for obesity, OHS, OSA, chronic respiratory failure with hypoxia and hypercapnia baseline 2 to 3 L of oxygen at home, noncompliant with CPAP, insulin-dependent diabetic, A. fib, CAD, failed CABG 2014, CVA, CKD stage III 4, diastolic heart failure presents with 3 days of worsening shortness of breath found to be hypercapnic and hypoxic requiring endotracheal intubation and PCCM admitted patient.  Chest x-ray was consistent with pulmonary edema, PO2 at 78, pH 7.3    Assessment & Plan:   Active Problems:   Acute on chronic respiratory failure with hypoxia and hypercapnia (HCC)   Goals of care, counseling/discussion   Palliative care encounter   Dysphagia    1-Acute on chronic hypercapnic and hypoxic respiratory failure secondary to decompensated diastolic heart failure, morbid obesity, OSA/OHS. NIPPV no compliance This is patient's second intubation in a month. Patient was treated with IV Lexis. CM discussed with wife, patient was extubated, one-way extubation.  Patient was made DNR.  Palliative care was consulted for further goals of care. Continue with CPAP , triology  as tolerated Resume lower home dose torsemide. Good urine out put 2.5 L yestreday Has allergies to prednisone. Continue with diuretics.  Sleepy, on CPAP, reports breathing okay. Better.  Good urine output yesterday 3.1 L.  2-Hypotension; thought to be related to sedation Stable.  3-history of chronic A. fib on Eliquis Continue with Eliquis and amiodarone.  Chronic kidney disease a stage III, hyperkalemia resolved. Cr range 2.6---2.7 Stable at 2.4. resume home dose torsemide.   Chronic lower extremity wounds: Present on admission. Continue with local care  Diabetes type 2: Continue with a sliding scale. Nutrition; will  defer palliative care further conversation with patient and wife in regards risk of aspiration Stage II buttock decubitus 1 pressure injury present on admission local care  Hypokalemia: Replete x1.  Pressure Injury 04/16/19 Buttocks Left;Lower Stage II -  Partial thickness loss of dermis presenting as a shallow open ulcer with a red, pink wound bed without slough. (Active)  04/16/19 1519  Location: Buttocks  Location Orientation: Left;Lower  Staging: Stage II -  Partial thickness loss of dermis presenting as a shallow open ulcer with a red, pink wound bed without slough.  Wound Description (Comments):   Present on Admission:      Nutrition Problem: Increased nutrient needs Etiology: acute illness    Signs/Symptoms: estimated needs    Interventions: Prostat, Tube feeding  Estimated body mass index is 51.74 kg/m as calculated from the following:   Height as of this encounter: 5\' 11"  (1.803 m).   Weight as of this encounter: 168.3 kg.   DVT prophylaxis: Eliquis Code Status: DNR Family Communication: Wife over the phone 9/24 Disposition Plan: continue conversation with patient and family in regards disposition  Consultants:   CCM  Palliative care  Procedures:   None  Antimicrobials:  None  Subjective: He is on trilogy vent, sleepy, relate breathing okay  Objective: Vitals:   05/18/19 0817 05/18/19 0820 05/18/19 1256 05/18/19 1323  BP:   136/75   Pulse:   (!) 117 83  Resp:   (!) 30 17  Temp:   98.3 F (36.8 C)   TempSrc:   Axillary   SpO2: 90% 90% 90% 98%  Weight:      Height:        Intake/Output Summary (Last 24 hours)  at 05/18/2019 1346 Last data filed at 05/18/2019 0547 Gross per 24 hour  Intake 480 ml  Output 2450 ml  Net -1970 ml   Filed Weights   05/14/19 0345 05/17/19 0530 05/18/19 0500  Weight: (!) 172.4 kg (!) 167.4 kg (!) 168.3 kg    Examination:  General exam: Morbidly obese, alert radiology event not acute distress Respiratory  system: No wheezing no crackles Cardiovascular system: S1-S2 regular rhythm and rate Gastrointestinal system: Bowel sounds present, soft nontender nondistended Central nervous system: Sleepy Extremities: BL with dressing   Data Reviewed: I have personally reviewed following labs and imaging studies  CBC: Recent Labs  Lab 05/12/19 1018 05/12/19 1222 05/13/19 0237 05/14/19 0511 05/15/19 0619 05/16/19 0257  WBC 7.7  --  8.1 7.4 9.1 7.5  NEUTROABS 4.7  --   --   --   --   --   HGB 11.3* 12.6* 10.8* 10.1* 10.5* 10.5*  HCT 40.1 37.0* 34.8* 33.4* 36.0* 37.3*  MCV 105.2*  --  98.9 99.4 102.6* 103.3*  PLT 184  --  224 225 190 644   Basic Metabolic Panel: Recent Labs  Lab 05/13/19 0237 05/13/19 1841 05/14/19 0511 05/14/19 1241 05/14/19 1806 05/15/19 0619 05/16/19 0257 05/16/19 0744 05/17/19 0327  NA 143  --  143 145  --  146*  --  146* 144  K 3.9  --  2.8* 3.7  --  3.2*  --  3.4* 3.5  CL 102  --  105 106  --  106  --  106 106  CO2 28  --  27 28  --  27  --  32 29  GLUCOSE 81  --  171* 250*  --  160*  --  154* 169*  BUN 36*  --  43* 46*  --  42*  --  36* 29*  CREATININE 2.87*  --  3.22* 3.33*  --  2.95*  --  2.46* 2.16*  CALCIUM 9.1  --  7.9* 8.3*  --  8.5*  --  8.9 8.8*  MG 1.9  --  2.1  --   --  2.1 2.3  --   --   PHOS  --  1.9* 3.5 3.4 3.8 4.1  --   --   --    GFR: Estimated Creatinine Clearance: 59.3 mL/min (A) (by C-G formula based on SCr of 2.16 mg/dL (H)). Liver Function Tests: Recent Labs  Lab 05/12/19 1018 05/13/19 0237 05/14/19 0511 05/15/19 0619  AST 35 29 15 14*  ALT 25 21 14 15   ALKPHOS 413* 340* 300* 330*  BILITOT 1.2 1.3* 1.2 0.9  PROT 7.1 6.4* 5.9* 6.3*  ALBUMIN 3.1* 2.8* 2.3* 2.6*   No results for input(s): LIPASE, AMYLASE in the last 168 hours. No results for input(s): AMMONIA in the last 168 hours. Coagulation Profile: Recent Labs  Lab 05/12/19 1018  INR 1.5*   Cardiac Enzymes: No results for input(s): CKTOTAL, CKMB, CKMBINDEX,  TROPONINI in the last 168 hours. BNP (last 3 results) No results for input(s): PROBNP in the last 8760 hours. HbA1C: No results for input(s): HGBA1C in the last 72 hours. CBG: Recent Labs  Lab 05/17/19 2012 05/18/19 0011 05/18/19 0423 05/18/19 0844 05/18/19 1214  GLUCAP 132* 142* 145* 180* 162*   Lipid Profile: No results for input(s): CHOL, HDL, LDLCALC, TRIG, CHOLHDL, LDLDIRECT in the last 72 hours. Thyroid Function Tests: No results for input(s): TSH, T4TOTAL, FREET4, T3FREE, THYROIDAB in the last 72 hours. Anemia Panel: No results  for input(s): VITAMINB12, FOLATE, FERRITIN, TIBC, IRON, RETICCTPCT in the last 72 hours. Sepsis Labs: Recent Labs  Lab 05/12/19 1018 05/12/19 1629  PROCALCITON  --  0.18  LATICACIDVEN 0.8  --     Recent Results (from the past 240 hour(s))  Blood Culture (routine x 2)     Status: Abnormal   Collection Time: 05/12/19 10:50 AM   Specimen: BLOOD RIGHT HAND  Result Value Ref Range Status   Specimen Description BLOOD RIGHT HAND  Final   Special Requests   Final    BOTTLES DRAWN AEROBIC AND ANAEROBIC Blood Culture adequate volume   Culture  Setup Time   Final    GRAM POSITIVE COCCI IN BOTH AEROBIC AND ANAEROBIC BOTTLES CRITICAL RESULT CALLED TO, READ BACK BY AND VERIFIED WITH: Hughie Closs, PHARMD AT [phone] ON 05/13/19 BY C. JESSUP, MT.    Culture (A)  Final    STAPHYLOCOCCUS SPECIES (COAGULASE NEGATIVE) THE SIGNIFICANCE OF ISOLATING THIS ORGANISM FROM A SINGLE SET OF BLOOD CULTURES WHEN MULTIPLE SETS ARE DRAWN IS UNCERTAIN. PLEASE NOTIFY THE MICROBIOLOGY DEPARTMENT WITHIN ONE WEEK IF SPECIATION AND SENSITIVITIES ARE REQUIRED. Performed at [facility], [place] [address] [place], [place] [address]    Report Status 05/15/2019 FINAL  Final  Blood Culture ID Panel (Reflexed)     Status: Abnormal   Collection Time: 05/12/19 10:50 AM  Result Value Ref Range Status   Enterococcus species NOT DETECTED NOT DETECTED Final   Listeria monocytogenes NOT  DETECTED NOT DETECTED Final   Staphylococcus species DETECTED (A) NOT DETECTED Final    Comment: Methicillin (oxacillin) resistant coagulase negative staphylococcus. Possible blood culture contaminant (unless isolated from more than one blood culture draw or clinical case suggests pathogenicity). No antibiotic treatment is indicated for blood  culture contaminants. CRITICAL RESULT CALLED TO, READ BACK BY AND VERIFIED WITH: Hughie Closs, PHARMD AT [phone] ON 05/13/19 BY  C. JESSUP, MT.    Staphylococcus aureus (BCID) NOT DETECTED NOT DETECTED Final   Methicillin resistance DETECTED (A) NOT DETECTED Final    Comment: CRITICAL RESULT CALLED TO, READ BACK BY AND VERIFIED WITH: Hughie Closs, PHARMD AT [phone] ON 05/13/19 BY C. JESSUP, MT.    Streptococcus species NOT DETECTED NOT DETECTED Final   Streptococcus agalactiae NOT DETECTED NOT DETECTED Final   Streptococcus pneumoniae NOT DETECTED NOT DETECTED Final   Streptococcus pyogenes NOT DETECTED NOT DETECTED Final   Acinetobacter baumannii NOT DETECTED NOT DETECTED Final   Enterobacteriaceae species NOT DETECTED NOT DETECTED Final   Enterobacter cloacae complex NOT DETECTED NOT DETECTED Final   Escherichia coli NOT DETECTED NOT DETECTED Final   Klebsiella oxytoca NOT DETECTED NOT DETECTED Final   Klebsiella pneumoniae NOT DETECTED NOT DETECTED Final   Proteus species NOT DETECTED NOT DETECTED Final   Serratia marcescens NOT DETECTED NOT DETECTED Final   Haemophilus influenzae NOT DETECTED NOT DETECTED Final   Neisseria meningitidis NOT DETECTED NOT DETECTED Final   Pseudomonas aeruginosa NOT DETECTED NOT DETECTED Final   Candida albicans NOT DETECTED NOT DETECTED Final   Candida glabrata NOT DETECTED NOT DETECTED Final   Candida krusei NOT DETECTED NOT DETECTED Final   Candida parapsilosis NOT DETECTED NOT DETECTED Final   Candida tropicalis NOT DETECTED NOT DETECTED Final    Comment: Performed at [facility], [place] [address], [place],  [place] [address]  SARS Coronavirus 2 [facility] order, Performed in [facility] hospital lab) Nasopharyngeal Nasopharyngeal Swab     Status: None   Collection Time: 05/12/19  11:01 AM   Specimen: Nasopharyngeal Swab  Result Value Ref Range Status   SARS Coronavirus 2 NEGATIVE NEGATIVE Final    Comment: (NOTE) If result is NEGATIVE SARS-CoV-2 target nucleic acids are NOT DETECTED. The SARS-CoV-2 RNA is generally detectable in upper and lower  respiratory specimens during the acute phase of infection. The lowest  concentration of SARS-CoV-2 viral copies this assay can detect is 250  copies / mL. A negative result does not preclude SARS-CoV-2 infection  and should not be used as the sole basis for treatment or other  patient management decisions.  A negative result may occur with  improper specimen collection / handling, submission of specimen other  than nasopharyngeal swab, presence of viral mutation(s) within the  areas targeted by this assay, and inadequate number of viral copies  (<250 copies / mL). A negative result must be combined with clinical  observations, patient history, and epidemiological information. If result is POSITIVE SARS-CoV-2 target nucleic acids are DETECTED. The SARS-CoV-2 RNA is generally detectable in upper and lower  respiratory specimens dur ing the acute phase of infection.  Positive  results are indicative of active infection with SARS-CoV-2.  Clinical  correlation with patient history and other diagnostic information is  necessary to determine patient infection status.  Positive results do  not rule out bacterial infection or co-infection with other viruses. If result is PRESUMPTIVE POSTIVE SARS-CoV-2 nucleic acids MAY BE PRESENT.   A presumptive positive result was obtained on the submitted specimen  and confirmed on repeat testing.  While 2019 novel coronavirus  (SARS-CoV-2) nucleic acids may be present in the submitted sample  additional confirmatory testing  may be necessary for epidemiological  and / or clinical management purposes  to differentiate between  SARS-CoV-2 and other Sarbecovirus currently known to infect humans.  If clinically indicated additional testing with an alternate test  methodology [phone]) is advised. The SARS-CoV-2 RNA is generally  detectable in upper and lower respiratory sp ecimens during the acute  phase of infection. The expected result is Negative. Fact Sheet for Patients:  StrictlyIdeas.no Fact Sheet for Healthcare Providers: BankingDealers.co.za This test is not yet approved or cleared by the [place] FDA and has been authorized for detection and/or diagnosis of SARS-CoV-2 by FDA under an Emergency Use Authorization (EUA).  This EUA will remain in effect (meaning this test can be used) for the duration of the COVID-19 declaration under Section 564(b)(1) of the Act, 21 U.S.C. section 360bbb-3(b)(1), unless the authorization is terminated or revoked sooner. Performed at [facility], [place] [address] [place], [place] [address]   Urine culture     Status: Abnormal   Collection Time: 05/12/19  2:49 PM   Specimen: In/Out Cath Urine  Result Value Ref Range Status   Specimen Description IN/OUT CATH URINE  Final   Special Requests   Final    NONE Performed at [facility], [place] [address], [place], [place] [address]    Culture 60,000 COLONIES/mL PROTEUS PENNERI (A)  Final   Report Status 05/15/2019 FINAL  Final   Organism ID, Bacteria PROTEUS PENNERI (A)  Final      Susceptibility   Proteus penneri - MIC*    AMPICILLIN >=32 RESISTANT Resistant     CEFAZOLIN >=64 RESISTANT Resistant     CEFTRIAXONE >=64 RESISTANT Resistant     CIPROFLOXACIN >=4 RESISTANT Resistant     GENTAMICIN <=1 SENSITIVE Sensitive     IMIPENEM <=0.25 SENSITIVE Sensitive  NITROFURANTOIN 256 RESISTANT Resistant     TRIMETH/SULFA <=20 SENSITIVE Sensitive      AMPICILLIN/SULBACTAM >=32 RESISTANT Resistant     PIP/TAZO <=4 SENSITIVE Sensitive     * 60,000 COLONIES/mL PROTEUS PENNERI  Blood Culture (routine x 2)     Status: None   Collection Time: 05/12/19  4:29 PM   Specimen: BLOOD RIGHT HAND  Result Value Ref Range Status   Specimen Description BLOOD RIGHT HAND  Final   Special Requests   Final    BOTTLES DRAWN AEROBIC ONLY Blood Culture results may not be optimal due to an inadequate volume of blood received in culture bottles   Culture   Final    NO GROWTH 5 DAYS Performed at [facility], [place] [address] [place], [place] [address]    Report Status 05/17/2019 FINAL  Final  MRSA PCR Screening     Status: None   Collection Time: 05/12/19  4:47 PM   Specimen: Nasopharyngeal  Result Value Ref Range Status   MRSA by PCR NEGATIVE NEGATIVE Final    Comment:        The GeneXpert MRSA Assay (FDA approved for NASAL specimens only), is one component of a comprehensive MRSA colonization surveillance program. It is not intended to diagnose MRSA infection nor to guide or monitor treatment for MRSA infections. Performed at [facility], [place] [address], [place],  [address]          Radiology Studies: No results found.      Scheduled Meds: . amiodarone  100 mg Oral Daily  . apixaban  5 mg Oral BID  . artificial tears   Both Eyes QHS  . budesonide (PULMICORT) nebulizer solution  0.25 mg Nebulization BID  . Chlorhexidine Gluconate Cloth  6 each Topical Daily  . guaiFENesin  600 mg Oral BID  . hydroxypropyl methylcellulose / hypromellose  1 drop Both Eyes TID  . insulin aspart  0-15 Units Subcutaneous Q4H  . ipratropium  0.5 mg Nebulization BID  . levalbuterol  0.63 mg Nebulization BID  . mouth rinse  15 mL Mouth Rinse BID  . pantoprazole (PROTONIX) IV  40 mg Intravenous QHS  . rosuvastatin  20 mg Oral q1800  . sodium chloride flush  10-40 mL Intracatheter Q12H  . topiramate  100 mg Oral QHS  . torsemide  80 mg  Oral Daily   Continuous Infusions:    LOS: 6 days    Time spent: 35 minutes.     Elmarie Shiley, MD [facility] Pager [phone]  If 7PM-7AM, please contact night-coverage [email or web address] Password [email or web address] 05/18/2019, 1:46 PM

## 2019-05-19 ENCOUNTER — Ambulatory Visit: Payer: Self-pay

## 2019-05-19 LAB — BASIC METABOLIC PANEL
Anion gap: 7 (ref 5–15)
BUN: 24 mg/dL — ABNORMAL HIGH (ref 6–20)
CO2: 35 mmol/L — ABNORMAL HIGH (ref 22–32)
Calcium: 8.6 mg/dL — ABNORMAL LOW (ref 8.9–10.3)
Chloride: 98 mmol/L (ref 98–111)
Creatinine, Ser: 2.18 mg/dL — ABNORMAL HIGH (ref 0.61–1.24)
GFR calc Af Amer: 37 mL/min — ABNORMAL LOW (ref >60)
GFR calc non Af Amer: 32 mL/min — ABNORMAL LOW (ref >60)
Glucose, Bld: 176 mg/dL — ABNORMAL HIGH (ref 70–99)
Potassium: 3.5 mmol/L (ref 3.5–5.1)
Sodium: 140 mmol/L (ref 135–145)

## 2019-05-19 LAB — GLUCOSE, CAPILLARY
Glucose-Capillary: 143 mg/dL — ABNORMAL HIGH (ref 70–99)
Glucose-Capillary: 152 mg/dL — ABNORMAL HIGH (ref 70–99)
Glucose-Capillary: 156 mg/dL — ABNORMAL HIGH (ref 70–99)
Glucose-Capillary: 161 mg/dL — ABNORMAL HIGH (ref 70–99)
Glucose-Capillary: 164 mg/dL — ABNORMAL HIGH (ref 70–99)
Glucose-Capillary: 209 mg/dL — ABNORMAL HIGH (ref 70–99)

## 2019-05-19 MED ORDER — NITROGLYCERIN 0.4 MG SL SUBL
0.4000 mg | SUBLINGUAL_TABLET | SUBLINGUAL | Status: DC | PRN
  Filled 2019-05-19: qty 1

## 2019-05-19 MED ORDER — GUAIFENESIN ER 600 MG PO TB12
1200.0000 mg | ORAL_TABLET | Freq: Two times a day (BID) | ORAL | Status: DC
  Administered 2019-05-19 – 2019-05-22 (×6): 1200 mg via ORAL
  Filled 2019-05-19 (×6): qty 2

## 2019-05-19 MED ORDER — GUAIFENESIN ER 600 MG PO TB12
600.0000 mg | ORAL_TABLET | Freq: Once | ORAL | Status: AC
  Administered 2019-05-19: 600 mg via ORAL
  Filled 2019-05-19: qty 1

## 2019-05-19 NOTE — Plan of Care (Signed)
  Problem: Education: Goal: Knowledge of General Education information will improve Description: Including pain rating scale, medication(s)/side effects and non-pharmacologic comfort measures Outcome: Progressing   Problem: Health Behavior/Discharge Planning: Goal: Ability to manage health-related needs will improve Outcome: Progressing   Problem: Clinical Measurements: Goal: Respiratory complications will improve Outcome: Progressing Goal: Cardiovascular complication will be avoided Outcome: Progressing   Problem: Activity: Goal: Risk for activity intolerance will decrease Outcome: Progressing   Problem: Nutrition: Goal: Adequate nutrition will be maintained Outcome: Progressing   Problem: Coping: Goal: Level of anxiety will decrease Outcome: Progressing   Problem: Elimination: Goal: Will not experience complications related to bowel motility Outcome: Progressing Goal: Will not experience complications related to urinary retention Outcome: Progressing   Problem: Pain Managment: Goal: General experience of comfort will improve Outcome: Progressing   Problem: Safety: Goal: Ability to remain free from injury will improve Outcome: Progressing   Problem: Skin Integrity: Goal: Risk for impaired skin integrity will decrease Outcome: Progressing   Problem: Respiratory: Goal: Ability to maintain a clear airway and adequate ventilation will improve Outcome: Progressing   Problem: Role Relationship: Goal: Method of communication will improve Outcome: Progressing

## 2019-05-19 NOTE — Progress Notes (Signed)
PROGRESS NOTE    [name].  NIO:[id number] DOB: 09-27-1960 DOA: 05/12/2019 PCP: Caren Macadam, MD   Brief Narrative: 58 year old with past medical history significant for obesity, OHS, OSA, chronic respiratory failure with hypoxia and hypercapnia baseline 2 to 3 L of oxygen at home, noncompliant with CPAP, insulin-dependent diabetic, A. fib, CAD, failed CABG 2014, CVA, CKD stage III 4, diastolic heart failure presents with 3 days of worsening shortness of breath found to be hypercapnic and hypoxic requiring endotracheal intubation and PCCM admitted patient.  Chest x-ray was consistent with pulmonary edema, PO2 at 78, pH 7.3    Assessment & Plan:   Active Problems:   Acute on chronic respiratory failure with hypoxia and hypercapnia (HCC)   Goals of care, counseling/discussion   Palliative care encounter   Dysphagia    1-Acute on chronic hypercapnic and hypoxic respiratory failure secondary to decompensated diastolic heart failure, morbid obesity, OSA/OHS. NIPPV no compliance This is patient's second intubation in a month. Patient was treated with IV Lexis. CM discussed with wife, patient was extubated, one-way extubation.  Patient was made DNR.  Palliative care was consulted for further goals of care. Continue with CPAP , triology  as tolerated Resume lower home dose torsemide. Good urine out put 2.5 L yestreday Has allergies to prednisone. Continue with diuretics.  Sleepy, on CPAP, reports breathing okay. Urine out put 3 L.  Started pulmicortt, increase guaifenesin.   2-Hypotension; thought to be related to sedation Stable.  3-history of chronic A. fib on Eliquis Continue with Eliquis and amiodarone.  Chronic kidney disease a stage III, hyperkalemia resolved. Cr range 2.6---2.7 Stable at 2.4. resume home dose torsemide.   Chronic lower extremity wounds: Present on admission. Continue with local care  Diabetes type 2: Continue with a sliding scale.  Nutrition; will defer palliative care further conversation with patient and wife in regards risk of aspiration Stage II buttock decubitus 1 pressure injury present on admission local care  Hypokalemia: Replete x1. Chest pain due to hypoxemia; improved. PRN pain meds.  Pressure Injury 04/16/19 Buttocks Left;Lower Stage II -  Partial thickness loss of dermis presenting as a shallow open ulcer with a red, pink wound bed without slough. (Active)  04/16/19 1519  Location: Buttocks  Location Orientation: Left;Lower  Staging: Stage II -  Partial thickness loss of dermis presenting as a shallow open ulcer with a red, pink wound bed without slough.  Wound Description (Comments):   Present on Admission:      Nutrition Problem: Increased nutrient needs Etiology: acute illness    Signs/Symptoms: estimated needs    Interventions: Prostat, Tube feeding  Estimated body mass index is 51.33 kg/m as calculated from the following:   Height as of this encounter: 5\' 11"  (1.803 m).   Weight as of this encounter: 166.9 kg.   DVT prophylaxis: Eliquis Code Status: DNR Family Communication: Wife over the phone 9/24 Disposition Plan: hopefully Residential hospice placement, awaiting palliative follow up  Consultants:   CCM  Palliative care  Procedures:   None  Antimicrobials:  None  Subjective: He had chest pain this am when off oxygen. He report improvement of pain . Still feels congested  Objective: Vitals:   05/19/19 0415 05/19/19 0600 05/19/19 0847 05/19/19 1410  BP:  (!) 156/97  (!) 149/80  Pulse:  (!) 108  81  Resp:  20  18  Temp:    98.5 F (36.9 C)  TempSrc:    Oral  SpO2:  93% 96%  95%  Weight: (!) 166.9 kg     Height:        Intake/Output Summary (Last 24 hours) at 05/19/2019 1451 Last data filed at 05/19/2019 1406 Gross per 24 hour  Intake 510 ml  Output 3600 ml  Net -3090 ml   Filed Weights   05/17/19 0530 05/18/19 0500 05/19/19 0415  Weight: (!) 167.4 kg (!)  168.3 kg (!) 166.9 kg    Examination:  General exam: Morbid obese, no acute distress Respiratory system:no wheezing, no crackles.  Cardiovascular system: S 1, S 2 RRR Gastrointestinal system: BS present, soft, nt, obese abdomen Central nervous system: alert, following some command Extremities: BL with dressing   Data Reviewed: I have personally reviewed following labs and imaging studies  CBC: Recent Labs  Lab 05/13/19 0237 05/14/19 0511 05/15/19 0619 05/16/19 0257  WBC 8.1 7.4 9.1 7.5  HGB 10.8* 10.1* 10.5* 10.5*  HCT 34.8* 33.4* 36.0* 37.3*  MCV 98.9 99.4 102.6* 103.3*  PLT 224 225 190 536   Basic Metabolic Panel: Recent Labs  Lab 05/13/19 0237 05/13/19 1841 05/14/19 0511 05/14/19 1241 05/14/19 1806 05/15/19 0619 05/16/19 0257 05/16/19 0744 05/17/19 0327  NA 143  --  143 145  --  146*  --  146* 144  K 3.9  --  2.8* 3.7  --  3.2*  --  3.4* 3.5  CL 102  --  105 106  --  106  --  106 106  CO2 28  --  27 28  --  27  --  32 29  GLUCOSE 81  --  171* 250*  --  160*  --  154* 169*  BUN 36*  --  43* 46*  --  42*  --  36* 29*  CREATININE 2.87*  --  3.22* 3.33*  --  2.95*  --  2.46* 2.16*  CALCIUM 9.1  --  7.9* 8.3*  --  8.5*  --  8.9 8.8*  MG 1.9  --  2.1  --   --  2.1 2.3  --   --   PHOS  --  1.9* 3.5 3.4 3.8 4.1  --   --   --    GFR: Estimated Creatinine Clearance: 59 mL/min (A) (by C-G formula based on SCr of 2.16 mg/dL (H)). Liver Function Tests: Recent Labs  Lab 05/13/19 0237 05/14/19 0511 05/15/19 0619  AST 29 15 14*  ALT 21 14 15   ALKPHOS 340* 300* 330*  BILITOT 1.3* 1.2 0.9  PROT 6.4* 5.9* 6.3*  ALBUMIN 2.8* 2.3* 2.6*   No results for input(s): LIPASE, AMYLASE in the last 168 hours. No results for input(s): AMMONIA in the last 168 hours. Coagulation Profile: No results for input(s): INR, PROTIME in the last 168 hours. Cardiac Enzymes: No results for input(s): CKTOTAL, CKMB, CKMBINDEX, TROPONINI in the last 168 hours. BNP (last 3 results) No  results for input(s): PROBNP in the last 8760 hours. HbA1C: No results for input(s): HGBA1C in the last 72 hours. CBG: Recent Labs  Lab 05/18/19 2001 05/19/19 0018 05/19/19 0411 05/19/19 0755 05/19/19 1157  GLUCAP 210* 152* 143* 161* 209*   Lipid Profile: No results for input(s): CHOL, HDL, LDLCALC, TRIG, CHOLHDL, LDLDIRECT in the last 72 hours. Thyroid Function Tests: No results for input(s): TSH, T4TOTAL, FREET4, T3FREE, THYROIDAB in the last 72 hours. Anemia Panel: No results for input(s): VITAMINB12, FOLATE, FERRITIN, TIBC, IRON, RETICCTPCT in the last 72 hours. Sepsis Labs: Recent Labs  Lab 05/12/19 1629  PROCALCITON  0.18    Recent Results (from the past 240 hour(s))  Blood Culture (routine x 2)     Status: Abnormal   Collection Time: 05/12/19 10:50 AM   Specimen: BLOOD RIGHT HAND  Result Value Ref Range Status   Specimen Description BLOOD RIGHT HAND  Final   Special Requests   Final    BOTTLES DRAWN AEROBIC AND ANAEROBIC Blood Culture adequate volume   Culture  Setup Time   Final    GRAM POSITIVE COCCI IN BOTH AEROBIC AND ANAEROBIC BOTTLES CRITICAL RESULT CALLED TO, READ BACK BY AND VERIFIED WITH: Hughie Closs, PHARMD AT [phone] ON 05/13/19 BY C. JESSUP, MT.    Culture (A)  Final    STAPHYLOCOCCUS SPECIES (COAGULASE NEGATIVE) THE SIGNIFICANCE OF ISOLATING THIS ORGANISM FROM A SINGLE SET OF BLOOD CULTURES WHEN MULTIPLE SETS ARE DRAWN IS UNCERTAIN. PLEASE NOTIFY THE MICROBIOLOGY DEPARTMENT WITHIN ONE WEEK IF SPECIATION AND SENSITIVITIES ARE REQUIRED. Performed at [facility], [place] [address], [place], [place] [address]    Report Status 05/15/2019 FINAL  Final  Blood Culture ID Panel (Reflexed)     Status: Abnormal   Collection Time: 05/12/19 10:50 AM  Result Value Ref Range Status   Enterococcus species NOT DETECTED NOT DETECTED Final   Listeria monocytogenes NOT DETECTED NOT DETECTED Final   Staphylococcus species DETECTED (A) NOT DETECTED Final    Comment:  Methicillin (oxacillin) resistant coagulase negative staphylococcus. Possible blood culture contaminant (unless isolated from more than one blood culture draw or clinical case suggests pathogenicity). No antibiotic treatment is indicated for blood  culture contaminants. CRITICAL RESULT CALLED TO, READ BACK BY AND VERIFIED WITH: Hughie Closs, PHARMD AT [phone] ON 05/13/19 BY  C. JESSUP, MT.    Staphylococcus aureus (BCID) NOT DETECTED NOT DETECTED Final   Methicillin resistance DETECTED (A) NOT DETECTED Final    Comment: CRITICAL RESULT CALLED TO, READ BACK BY AND VERIFIED WITH: Hughie Closs, PHARMD AT [phone] ON 05/13/19 BY C. JESSUP, MT.    Streptococcus species NOT DETECTED NOT DETECTED Final   Streptococcus agalactiae NOT DETECTED NOT DETECTED Final   Streptococcus pneumoniae NOT DETECTED NOT DETECTED Final   Streptococcus pyogenes NOT DETECTED NOT DETECTED Final   Acinetobacter baumannii NOT DETECTED NOT DETECTED Final   Enterobacteriaceae species NOT DETECTED NOT DETECTED Final   Enterobacter cloacae complex NOT DETECTED NOT DETECTED Final   Escherichia coli NOT DETECTED NOT DETECTED Final   Klebsiella oxytoca NOT DETECTED NOT DETECTED Final   Klebsiella pneumoniae NOT DETECTED NOT DETECTED Final   Proteus species NOT DETECTED NOT DETECTED Final   Serratia marcescens NOT DETECTED NOT DETECTED Final   Haemophilus influenzae NOT DETECTED NOT DETECTED Final   Neisseria meningitidis NOT DETECTED NOT DETECTED Final   Pseudomonas aeruginosa NOT DETECTED NOT DETECTED Final   Candida albicans NOT DETECTED NOT DETECTED Final   Candida glabrata NOT DETECTED NOT DETECTED Final   Candida krusei NOT DETECTED NOT DETECTED Final   Candida parapsilosis NOT DETECTED NOT DETECTED Final   Candida tropicalis NOT DETECTED NOT DETECTED Final    Comment: Performed at [facility], [place]. [address], [place], [place] [address]  SARS Coronavirus 2 [facility] order, Performed in [facility] hospital lab)  Nasopharyngeal Nasopharyngeal Swab     Status: None   Collection Time: 05/12/19 11:01 AM   Specimen: Nasopharyngeal Swab  Result Value Ref Range Status   SARS Coronavirus 2 NEGATIVE NEGATIVE Final    Comment: (NOTE) If result is NEGATIVE SARS-CoV-2 target nucleic acids  are NOT DETECTED. The SARS-CoV-2 RNA is generally detectable in upper and lower  respiratory specimens during the acute phase of infection. The lowest  concentration of SARS-CoV-2 viral copies this assay can detect is 250  copies / mL. A negative result does not preclude SARS-CoV-2 infection  and should not be used as the sole basis for treatment or other  patient management decisions.  A negative result may occur with  improper specimen collection / handling, submission of specimen other  than nasopharyngeal swab, presence of viral mutation(s) within the  areas targeted by this assay, and inadequate number of viral copies  (<250 copies / mL). A negative result must be combined with clinical  observations, patient history, and epidemiological information. If result is POSITIVE SARS-CoV-2 target nucleic acids are DETECTED. The SARS-CoV-2 RNA is generally detectable in upper and lower  respiratory specimens dur ing the acute phase of infection.  Positive  results are indicative of active infection with SARS-CoV-2.  Clinical  correlation with patient history and other diagnostic information is  necessary to determine patient infection status.  Positive results do  not rule out bacterial infection or co-infection with other viruses. If result is PRESUMPTIVE POSTIVE SARS-CoV-2 nucleic acids MAY BE PRESENT.   A presumptive positive result was obtained on the submitted specimen  and confirmed on repeat testing.  While 2019 novel coronavirus  (SARS-CoV-2) nucleic acids may be present in the submitted sample  additional confirmatory testing may be necessary for epidemiological  and / or clinical management purposes  to  differentiate between  SARS-CoV-2 and other Sarbecovirus currently known to infect humans.  If clinically indicated additional testing with an alternate test  methodology [phone]) is advised. The SARS-CoV-2 RNA is generally  detectable in upper and lower respiratory sp ecimens during the acute  phase of infection. The expected result is Negative. Fact Sheet for Patients:  StrictlyIdeas.no Fact Sheet for Healthcare Providers: BankingDealers.co.za This test is not yet approved or cleared by the [place] FDA and has been authorized for detection and/or diagnosis of SARS-CoV-2 by FDA under an Emergency Use Authorization (EUA).  This EUA will remain in effect (meaning this test can be used) for the duration of the COVID-19 declaration under Section 564(b)(1) of the Act, 21 U.S.C. section 360bbb-3(b)(1), unless the authorization is terminated or revoked sooner. Performed at [facility], [place] [address], [place], [place] [address]   Urine culture     Status: Abnormal   Collection Time: 05/12/19  2:49 PM   Specimen: In/Out Cath Urine  Result Value Ref Range Status   Specimen Description IN/OUT CATH URINE  Final   Special Requests   Final    NONE Performed at [facility], [place] [address], [place], [place] [address]    Culture 60,000 COLONIES/mL PROTEUS PENNERI (A)  Final   Report Status 05/15/2019 FINAL  Final   Organism ID, Bacteria PROTEUS PENNERI (A)  Final      Susceptibility   Proteus penneri - MIC*    AMPICILLIN >=32 RESISTANT Resistant     CEFAZOLIN >=64 RESISTANT Resistant     CEFTRIAXONE >=64 RESISTANT Resistant     CIPROFLOXACIN >=4 RESISTANT Resistant     GENTAMICIN <=1 SENSITIVE Sensitive     IMIPENEM <=0.25 SENSITIVE Sensitive     NITROFURANTOIN 256 RESISTANT Resistant     TRIMETH/SULFA <=20 SENSITIVE Sensitive     AMPICILLIN/SULBACTAM >=32 RESISTANT Resistant     PIP/TAZO <=4 SENSITIVE Sensitive     *  60,000 COLONIES/mL PROTEUS PENNERI  Blood Culture (routine x 2)     Status: None   Collection Time: 05/12/19  4:29 PM   Specimen: BLOOD RIGHT HAND  Result Value Ref Range Status   Specimen Description BLOOD RIGHT HAND  Final   Special Requests   Final    BOTTLES DRAWN AEROBIC ONLY Blood Culture results may not be optimal due to an inadequate volume of blood received in culture bottles   Culture   Final    NO GROWTH 5 DAYS Performed at [facility], [place] [address], [place], [place] [address]    Report Status 05/17/2019 FINAL  Final  MRSA PCR Screening     Status: None   Collection Time: 05/12/19  4:47 PM   Specimen: Nasopharyngeal  Result Value Ref Range Status   MRSA by PCR NEGATIVE NEGATIVE Final    Comment:        The GeneXpert MRSA Assay (FDA approved for NASAL specimens only), is one component of a comprehensive MRSA colonization surveillance program. It is not intended to diagnose MRSA infection nor to guide or monitor treatment for MRSA infections. Performed at [facility], [place] [address], [place], [place] [address]          Radiology Studies: No results found.      Scheduled Meds: . amiodarone  100 mg Oral Daily  . apixaban  5 mg Oral BID  . artificial tears   Both Eyes QHS  . budesonide (PULMICORT) nebulizer solution  0.25 mg Nebulization BID  . Chlorhexidine Gluconate Cloth  6 each Topical Daily  . guaiFENesin  1,200 mg Oral BID  . hydroxypropyl methylcellulose / hypromellose  1 drop Both Eyes TID  . insulin aspart  0-15 Units Subcutaneous Q4H  . ipratropium  0.5 mg Nebulization BID  . levalbuterol  0.63 mg Nebulization BID  . mouth rinse  15 mL Mouth Rinse BID  . pantoprazole  40 mg Oral QHS  . rosuvastatin  20 mg Oral q1800  . sodium chloride flush  10-40 mL Intracatheter Q12H  . topiramate  100 mg Oral QHS  . torsemide  80 mg Oral Daily   Continuous Infusions:    LOS: 7 days    Time spent: 35 minutes.     Elmarie Shiley, MD [facility] Pager [phone]  If 7PM-7AM, please contact night-coverage [email or web address] Password [facility] 05/19/2019, 2:51 PM

## 2019-05-19 NOTE — Progress Notes (Addendum)
Physical Therapy Discharge Patient Details Name: [name]. MRN: [id number] DOB: January 21, 1961 Today's Date: 05/19/2019 Time:  -     Patient discharged from PT services secondary to medical decline - will need to re-order PT to resume therapy services.  Please see latest therapy progress note for current level of functioning and progress toward goals.    Progress and discharge plan discussed with patient and/or caregiver: Patient/Caregiver agrees with plan  Pt will be discharged home with hospice care and per MD pt will no longer need PT. Please reorder if needed.      Earney Navy, PTA Acute Rehabilitation Services Pager: [phone] Office: [phone]   05/19/2019, 3:38 PM

## 2019-05-19 NOTE — Care Management Important Message (Signed)
Important Message  Patient Details  Name: [name]. MRN: [id number] Date of Birth: 15-Sep-1960   Medicare Important Message Given:  Yes     Tnya Ades 05/19/2019, 4:03 PM

## 2019-05-19 NOTE — Progress Notes (Signed)
Patient c/o chest pain of 10/10 on a pain scale and requested for NTG sublingual. Patient desaturated  to the 70 s when called to the bedside. This RN noted his oxygen was not on. 02 was applied and his oxygen level went up to 93 on 7L/[place] with his Trilogy. After applying the 02, pt. . denied chest pain and refused the NTG and requested  for Hydrocodone PRN instead  for right sided generalized pain. Will reassess  and continue to monitor.

## 2019-05-20 LAB — GLUCOSE, CAPILLARY
Glucose-Capillary: 138 mg/dL — ABNORMAL HIGH (ref 70–99)
Glucose-Capillary: 179 mg/dL — ABNORMAL HIGH (ref 70–99)
Glucose-Capillary: 180 mg/dL — ABNORMAL HIGH (ref 70–99)
Glucose-Capillary: 184 mg/dL — ABNORMAL HIGH (ref 70–99)
Glucose-Capillary: 201 mg/dL — ABNORMAL HIGH (ref 70–99)
Glucose-Capillary: 204 mg/dL — ABNORMAL HIGH (ref 70–99)

## 2019-05-20 MED ORDER — LEVALBUTEROL HCL 0.63 MG/3ML IN NEBU
0.6300 mg | INHALATION_SOLUTION | Freq: Once | RESPIRATORY_TRACT | Status: AC
  Administered 2019-05-20: 0.63 mg via RESPIRATORY_TRACT
  Filled 2019-05-20: qty 3

## 2019-05-20 MED ORDER — DEXTROMETHORPHAN POLISTIREX ER 30 MG/5ML PO SUER
30.0000 mg | Freq: Two times a day (BID) | ORAL | Status: DC
  Administered 2019-05-20 – 2019-05-22 (×5): 30 mg via ORAL
  Filled 2019-05-20 (×5): qty 5

## 2019-05-20 MED ORDER — SENNA 8.6 MG PO TABS
1.0000 | ORAL_TABLET | Freq: Every day | ORAL | Status: DC
  Administered 2019-05-20 – 2019-05-28 (×9): 8.6 mg via ORAL
  Filled 2019-05-20 (×9): qty 1

## 2019-05-20 NOTE — Significant Event (Signed)
Rapid Response Event Note   Initial Focused Assessment: Patient with O2 sats 69-73% on continuous pulse Ox.  Per RN has been reading such for about 2 hours.  Patient uncomfortable complaining of shortness of breath and racing HR.  HR 102  BP 136/68  RR 20   Patient on home Trilogy machine and Nasal canula.  Interventions: Traced O2 canula, not connected to O2 flow meter appropriately.   Corrected O2 setup.  Placed patient on 7L [place].  O2 sats 97%.  Weaned O2 to 5L   Patient much more comfortable and conversant.  Plan of Care (if not transferred): RN to call if assistance needed.  Event Summary:        [name]

## 2019-05-20 NOTE — Progress Notes (Signed)
Palliative:  HPI:58 y.o.malewith past medical history of diastolic CHF, CAD, HTN, MI, atrial fib, s/p CABG, stroke, OSA/OHS (does not use CPAP), diabetes, CKD stage 3-4, s/p acoustic neuroma resection with residual temporal neuralgiaadmitted on 9/21/2020with worsening SOB requiring intubation.Optimized with plans for one way extubationcompleted 9/23. Continuing conversations for possible hospice care and overall GOC. So far he is doing well off ventand utilizing trilogy machine that wife has brought from home.Continues to thrive while hospitalized but requiring more oxygen than previously using at home.   I met again today with [name]. He is in good spirits today with no complaints except for a cough that has improved since this morning. I discussed with him again [place] and moving forward. He still does not wish for re-intubation but insistent "I want to live" and continue current interventions. With this in mind he would not be a good candidate for [employer]. He also refuses to go anywhere but home at this time insisting that he could manage and if he needs help that all he has to do is call up family/friends to be with him if needed. He agrees with having hospice support if he is able to return home.   I called and spoke with wife, [name]. I told her about our conversation and that he insists on going home. She has previously stated this is not possible but now says that she will need to ask family/friends if they could help out while she works. We discussed how this will make him much happier than a facility if at all possible. We discussed that if things get too difficult to manage at home then we could at that time consider transition to [facility].   All questions/concerns addressed. Emotional support provided.   Updated Dr. Tyrell Antonio.   Exam: Alert, oriented but limited insight and judgement. No distress. Breathing regular, unlabored. Abd soft, obese. Legs edema and wrapped.    Plan: - Family considering logistics of returning home with hospice  35 min  Vinie Sill, NP Palliative Medicine Team Pager [phone] (Please see [email or web address] for schedule) Team Phone [phone]    Greater than 50%  of this time was spent counseling and coordinating care related to the above assessment and plan

## 2019-05-20 NOTE — Progress Notes (Signed)
Patient requested to sit up on the side of the bed and take his am breathing treatments. Tolerated this well, but when patient laid back in bed, noticed oxygen sats were around 86-88%. Denied shortness of breath at this time and stated that he "felt fine." Called respiratory therapist about low sats who came to assess situation and said pleth on monitor did not look accurate and that the patient was not showing any signs of distress so the readings may not have been accurate. Continued to monitor patient and noticed sats were dropping to 60's-70's. Rapid response RN called who noticed oxygen was not set up properly. Sats currently in 90's on 6L/, patient resting comfortably with wife at bedside.

## 2019-05-20 NOTE — Progress Notes (Signed)
Pt has a DL picc in the R upper arm;  Site is sl pink; has pre-existing skin tear noted lateral to dressing;  Both ports of picc are clotted; pt no longer receiving iv medicatons; recommend removing picc;  RN aware and will notify MD;

## 2019-05-20 NOTE — Progress Notes (Signed)
SLP Cancellation Note  Patient Details Name: [name]. MRN: [id number] DOB: 01/31/1961   Cancelled treatment:       Reason Eval/Treat Not Completed: Other (comment); Pt just finished breakfast, working with staff and then back on Cpap. Will continue efforts   Miranda Garber, Katherene Ponto 05/20/2019, 1:58 PM

## 2019-05-20 NOTE — Progress Notes (Signed)
PROGRESS NOTE    [name].  IPJ:[id number] DOB: 07/19/1961 DOA: 05/12/2019 PCP: Caren Macadam, MD   Brief Narrative: 58 year old with past medical history significant for obesity, OHS, OSA, chronic respiratory failure with hypoxia and hypercapnia baseline 2 to 3 L of oxygen at home, noncompliant with CPAP, insulin-dependent diabetic, A. fib, CAD, failed CABG 2014, CVA, CKD stage III 4, diastolic heart failure presents with 3 days of worsening shortness of breath found to be hypercapnic and hypoxic requiring endotracheal intubation and PCCM admitted patient.  Chest x-ray was consistent with pulmonary edema, PO2 at 78, pH 7.3    Assessment & Plan:   Active Problems:   Acute on chronic respiratory failure with hypoxia and hypercapnia (HCC)   Goals of care, counseling/discussion   Palliative care encounter   Dysphagia    1-Acute on chronic hypercapnic and hypoxic respiratory failure secondary to decompensated diastolic heart failure, morbid obesity, OSA/OHS. NIPPV no compliance This is patient's second intubation in a month. Patient was treated with IV Lexis. CM discussed with wife, patient was extubated, one-way extubation.  Patient was made DNR.  Palliative care was consulted for further goals of care. Continue with CPAP , triology  as tolerated Resume lower home dose torsemide. Good urine out put 2.5 L yestreday Has allergies to prednisone. Continue with diuretics.  Sleepy, on CPAP, reports breathing okay. Urine out put 3 L.  Started pulmicortt, increase guaifenesin. Added dextromethorphan.   2-Hypotension; thought to be related to sedation Stable.  3-History of chronic A. fib on Eliquis Continue with Eliquis and amiodarone.  Chronic kidney disease a stage III, hyperkalemia resolved. Cr range 2.6---2.7 Stable at 2.4. resume home dose torsemide.   Chronic lower extremity wounds: Present on admission. Continue with local care  Diabetes type 2: Continue  with a sliding scale. Nutrition; will defer palliative care further conversation with patient and wife in regards risk of aspiration Stage II buttock decubitus 1 pressure injury present on admission local care  Hypokalemia: Replete x1. Chest pain due to hypoxemia; improved. PRN pain meds.  Pressure Injury 04/16/19 Buttocks Left;Lower Stage II -  Partial thickness loss of dermis presenting as a shallow open ulcer with a red, pink wound bed without slough. (Active)  04/16/19 1519  Location: Buttocks  Location Orientation: Left;Lower  Staging: Stage II -  Partial thickness loss of dermis presenting as a shallow open ulcer with a red, pink wound bed without slough.  Wound Description (Comments):   Present on Admission:      Nutrition Problem: Increased nutrient needs Etiology: acute illness    Signs/Symptoms: estimated needs    Interventions: Prostat, Tube feeding  Estimated body mass index is 51.33 kg/m as calculated from the following:   Height as of this encounter: 5\' 11"  (1.803 m).   Weight as of this encounter: 166.9 kg.   DVT prophylaxis: Eliquis Code Status: DNR Family Communication: Wife over the phone 9/24 Disposition Plan: Palliative care team discussing with patient and family disposition Consultants:   CCM  Palliative care  Procedures:   None  Antimicrobials:  None  Subjective: He is complaining of congestion. Denies chest pain   Objective: Vitals:   05/19/19 2046 05/19/19 2101 05/19/19 2236 05/20/19 0414  BP:  (!) 151/61  125/63  Pulse:  95 (!) 109 87  Resp:  18 20 18   Temp:  98.2 F (36.8 C)  98.3 F (36.8 C)  TempSrc:  Oral  Oral  SpO2: 98% 99% 98% 98%  Weight:  Height:        Intake/Output Summary (Last 24 hours) at 05/20/2019 1339 Last data filed at 05/20/2019 1330 Gross per 24 hour  Intake 220 ml  Output 3400 ml  Net -3180 ml   Filed Weights   05/17/19 0530 05/18/19 0500 05/19/19 0415  Weight: (!) 167.4 kg (!) 168.3 kg (!)  166.9 kg    Examination:  General exam: Morbid obese, NAD Respiratory system: No wheezing, no crackles.  Cardiovascular system: S 1, S 2 RRR Gastrointestinal system: BS present, soft, nt Central nervous system: Alert, following command Extremities: BL with dressing   Data Reviewed: I have personally reviewed following labs and imaging studies  CBC: Recent Labs  Lab 05/14/19 0511 05/15/19 0619 05/16/19 0257  WBC 7.4 9.1 7.5  HGB 10.1* 10.5* 10.5*  HCT 33.4* 36.0* 37.3*  MCV 99.4 102.6* 103.3*  PLT 225 190 761   Basic Metabolic Panel: Recent Labs  Lab 05/13/19 1841  05/14/19 0511 05/14/19 1241 05/14/19 1806 05/15/19 0619 05/16/19 0257 05/16/19 0744 05/17/19 0327 05/19/19 1625  NA  --    < > 143 145  --  146*  --  146* 144 140  K  --    < > 2.8* 3.7  --  3.2*  --  3.4* 3.5 3.5  CL  --    < > 105 106  --  106  --  106 106 98  CO2  --    < > 27 28  --  27  --  32 29 35*  GLUCOSE  --    < > 171* 250*  --  160*  --  154* 169* 176*  BUN  --    < > 43* 46*  --  42*  --  36* 29* 24*  CREATININE  --    < > 3.22* 3.33*  --  2.95*  --  2.46* 2.16* 2.18*  CALCIUM  --    < > 7.9* 8.3*  --  8.5*  --  8.9 8.8* 8.6*  MG  --   --  2.1  --   --  2.1 2.3  --   --   --   PHOS 1.9*  --  3.5 3.4 3.8 4.1  --   --   --   --    < > = values in this interval not displayed.   GFR: Estimated Creatinine Clearance: 58.5 mL/min (A) (by C-G formula based on SCr of 2.18 mg/dL (H)). Liver Function Tests: Recent Labs  Lab 05/14/19 0511 05/15/19 0619  AST 15 14*  ALT 14 15  ALKPHOS 300* 330*  BILITOT 1.2 0.9  PROT 5.9* 6.3*  ALBUMIN 2.3* 2.6*   No results for input(s): LIPASE, AMYLASE in the last 168 hours. No results for input(s): AMMONIA in the last 168 hours. Coagulation Profile: No results for input(s): INR, PROTIME in the last 168 hours. Cardiac Enzymes: No results for input(s): CKTOTAL, CKMB, CKMBINDEX, TROPONINI in the last 168 hours. BNP (last 3 results) No results for  input(s): PROBNP in the last 8760 hours. HbA1C: No results for input(s): HGBA1C in the last 72 hours. CBG: Recent Labs  Lab 05/19/19 2028 05/20/19 0027 05/20/19 0411 05/20/19 0759 05/20/19 1211  GLUCAP 156* 138* 184* 179* 204*   Lipid Profile: No results for input(s): CHOL, HDL, LDLCALC, TRIG, CHOLHDL, LDLDIRECT in the last 72 hours. Thyroid Function Tests: No results for input(s): TSH, T4TOTAL, FREET4, T3FREE, THYROIDAB in the last 72 hours. Anemia Panel: No results  for input(s): VITAMINB12, FOLATE, FERRITIN, TIBC, IRON, RETICCTPCT in the last 72 hours. Sepsis Labs: No results for input(s): PROCALCITON, LATICACIDVEN in the last 168 hours.  Recent Results (from the past 240 hour(s))  Blood Culture (routine x 2)     Status: Abnormal   Collection Time: 05/12/19 10:50 AM   Specimen: BLOOD RIGHT HAND  Result Value Ref Range Status   Specimen Description BLOOD RIGHT HAND  Final   Special Requests   Final    BOTTLES DRAWN AEROBIC AND ANAEROBIC Blood Culture adequate volume   Culture  Setup Time   Final    GRAM POSITIVE COCCI IN BOTH AEROBIC AND ANAEROBIC BOTTLES CRITICAL RESULT CALLED TO, READ BACK BY AND VERIFIED WITH: Hughie Closs, PHARMD AT [phone] ON 05/13/19 BY C. JESSUP, MT.    Culture (A)  Final    STAPHYLOCOCCUS SPECIES (COAGULASE NEGATIVE) THE SIGNIFICANCE OF ISOLATING THIS ORGANISM FROM A SINGLE SET OF BLOOD CULTURES WHEN MULTIPLE SETS ARE DRAWN IS UNCERTAIN. PLEASE NOTIFY THE MICROBIOLOGY DEPARTMENT WITHIN ONE WEEK IF SPECIATION AND SENSITIVITIES ARE REQUIRED. Performed at [facility], [place] [address], [place], [place] [address]    Report Status 05/15/2019 FINAL  Final  Blood Culture ID Panel (Reflexed)     Status: Abnormal   Collection Time: 05/12/19 10:50 AM  Result Value Ref Range Status   Enterococcus species NOT DETECTED NOT DETECTED Final   Listeria monocytogenes NOT DETECTED NOT DETECTED Final   Staphylococcus species DETECTED (A) NOT DETECTED Final     Comment: Methicillin (oxacillin) resistant coagulase negative staphylococcus. Possible blood culture contaminant (unless isolated from more than one blood culture draw or clinical case suggests pathogenicity). No antibiotic treatment is indicated for blood  culture contaminants. CRITICAL RESULT CALLED TO, READ BACK BY AND VERIFIED WITH: Hughie Closs, PHARMD AT [phone] ON 05/13/19 BY  C. JESSUP, MT.    Staphylococcus aureus (BCID) NOT DETECTED NOT DETECTED Final   Methicillin resistance DETECTED (A) NOT DETECTED Final    Comment: CRITICAL RESULT CALLED TO, READ BACK BY AND VERIFIED WITH: Hughie Closs, PHARMD AT [phone] ON 05/13/19 BY C. JESSUP, MT.    Streptococcus species NOT DETECTED NOT DETECTED Final   Streptococcus agalactiae NOT DETECTED NOT DETECTED Final   Streptococcus pneumoniae NOT DETECTED NOT DETECTED Final   Streptococcus pyogenes NOT DETECTED NOT DETECTED Final   Acinetobacter baumannii NOT DETECTED NOT DETECTED Final   Enterobacteriaceae species NOT DETECTED NOT DETECTED Final   Enterobacter cloacae complex NOT DETECTED NOT DETECTED Final   Escherichia coli NOT DETECTED NOT DETECTED Final   Klebsiella oxytoca NOT DETECTED NOT DETECTED Final   Klebsiella pneumoniae NOT DETECTED NOT DETECTED Final   Proteus species NOT DETECTED NOT DETECTED Final   Serratia marcescens NOT DETECTED NOT DETECTED Final   Haemophilus influenzae NOT DETECTED NOT DETECTED Final   Neisseria meningitidis NOT DETECTED NOT DETECTED Final   Pseudomonas aeruginosa NOT DETECTED NOT DETECTED Final   Candida albicans NOT DETECTED NOT DETECTED Final   Candida glabrata NOT DETECTED NOT DETECTED Final   Candida krusei NOT DETECTED NOT DETECTED Final   Candida parapsilosis NOT DETECTED NOT DETECTED Final   Candida tropicalis NOT DETECTED NOT DETECTED Final    Comment: Performed at [facility], [place] [address], [place], [place] [address]  SARS Coronavirus 2 [facility] order, Performed in [facility] hospital lab)  Nasopharyngeal Nasopharyngeal Swab     Status: None   Collection Time: 05/12/19 11:01 AM   Specimen: Nasopharyngeal Swab  Result Value Ref  Range Status   SARS Coronavirus 2 NEGATIVE NEGATIVE Final    Comment: (NOTE) If result is NEGATIVE SARS-CoV-2 target nucleic acids are NOT DETECTED. The SARS-CoV-2 RNA is generally detectable in upper and lower  respiratory specimens during the acute phase of infection. The lowest  concentration of SARS-CoV-2 viral copies this assay can detect is 250  copies / mL. A negative result does not preclude SARS-CoV-2 infection  and should not be used as the sole basis for treatment or other  patient management decisions.  A negative result may occur with  improper specimen collection / handling, submission of specimen other  than nasopharyngeal swab, presence of viral mutation(s) within the  areas targeted by this assay, and inadequate number of viral copies  (<250 copies / mL). A negative result must be combined with clinical  observations, patient history, and epidemiological information. If result is POSITIVE SARS-CoV-2 target nucleic acids are DETECTED. The SARS-CoV-2 RNA is generally detectable in upper and lower  respiratory specimens dur ing the acute phase of infection.  Positive  results are indicative of active infection with SARS-CoV-2.  Clinical  correlation with patient history and other diagnostic information is  necessary to determine patient infection status.  Positive results do  not rule out bacterial infection or co-infection with other viruses. If result is PRESUMPTIVE POSTIVE SARS-CoV-2 nucleic acids MAY BE PRESENT.   A presumptive positive result was obtained on the submitted specimen  and confirmed on repeat testing.  While 2019 novel coronavirus  (SARS-CoV-2) nucleic acids may be present in the submitted sample  additional confirmatory testing may be necessary for epidemiological  and / or clinical management purposes  to  differentiate between  SARS-CoV-2 and other Sarbecovirus currently known to infect humans.  If clinically indicated additional testing with an alternate test  methodology [phone]) is advised. The SARS-CoV-2 RNA is generally  detectable in upper and lower respiratory sp ecimens during the acute  phase of infection. The expected result is Negative. Fact Sheet for Patients:  StrictlyIdeas.no Fact Sheet for Healthcare Providers: BankingDealers.co.za This test is not yet approved or cleared by the [place] FDA and has been authorized for detection and/or diagnosis of SARS-CoV-2 by FDA under an Emergency Use Authorization (EUA).  This EUA will remain in effect (meaning this test can be used) for the duration of the COVID-19 declaration under Section 564(b)(1) of the Act, 21 U.S.C. section 360bbb-3(b)(1), unless the authorization is terminated or revoked sooner. Performed at [facility], [place] [address], [place], [place] [address]   Urine culture     Status: Abnormal   Collection Time: 05/12/19  2:49 PM   Specimen: In/Out Cath Urine  Result Value Ref Range Status   Specimen Description IN/OUT CATH URINE  Final   Special Requests   Final    NONE Performed at [facility], [place] [address], [place], [place] [address]    Culture 60,000 COLONIES/mL PROTEUS PENNERI (A)  Final   Report Status 05/15/2019 FINAL  Final   Organism ID, Bacteria PROTEUS PENNERI (A)  Final      Susceptibility   Proteus penneri - MIC*    AMPICILLIN >=32 RESISTANT Resistant     CEFAZOLIN >=64 RESISTANT Resistant     CEFTRIAXONE >=64 RESISTANT Resistant     CIPROFLOXACIN >=4 RESISTANT Resistant     GENTAMICIN <=1 SENSITIVE Sensitive     IMIPENEM <=0.25 SENSITIVE Sensitive     NITROFURANTOIN 256 RESISTANT Resistant     TRIMETH/SULFA <=20 SENSITIVE  Sensitive     AMPICILLIN/SULBACTAM >=32 RESISTANT Resistant     PIP/TAZO <=4 SENSITIVE Sensitive     *  60,000 COLONIES/mL PROTEUS PENNERI  Blood Culture (routine x 2)     Status: None   Collection Time: 05/12/19  4:29 PM   Specimen: BLOOD RIGHT HAND  Result Value Ref Range Status   Specimen Description BLOOD RIGHT HAND  Final   Special Requests   Final    BOTTLES DRAWN AEROBIC ONLY Blood Culture results may not be optimal due to an inadequate volume of blood received in culture bottles   Culture   Final    NO GROWTH 5 DAYS Performed at [facility], [place] [address], [place], [place] [address]    Report Status 05/17/2019 FINAL  Final  MRSA PCR Screening     Status: None   Collection Time: 05/12/19  4:47 PM   Specimen: Nasopharyngeal  Result Value Ref Range Status   MRSA by PCR NEGATIVE NEGATIVE Final    Comment:        The GeneXpert MRSA Assay (FDA approved for NASAL specimens only), is one component of a comprehensive MRSA colonization surveillance program. It is not intended to diagnose MRSA infection nor to guide or monitor treatment for MRSA infections. Performed at [facility], [place] [address], [place], [place] [address]          Radiology Studies: No results found.      Scheduled Meds: . amiodarone  100 mg Oral Daily  . apixaban  5 mg Oral BID  . artificial tears   Both Eyes QHS  . budesonide (PULMICORT) nebulizer solution  0.25 mg Nebulization BID  . Chlorhexidine Gluconate Cloth  6 each Topical Daily  . dextromethorphan  30 mg Oral BID  . guaiFENesin  1,200 mg Oral BID  . hydroxypropyl methylcellulose / hypromellose  1 drop Both Eyes TID  . insulin aspart  0-15 Units Subcutaneous Q4H  . ipratropium  0.5 mg Nebulization BID  . levalbuterol  0.63 mg Nebulization BID  . mouth rinse  15 mL Mouth Rinse BID  . pantoprazole  40 mg Oral QHS  . rosuvastatin  20 mg Oral q1800  . senna  1 tablet Oral QHS  . sodium chloride flush  10-40 mL Intracatheter Q12H  . topiramate  100 mg Oral QHS  . torsemide  80 mg Oral Daily   Continuous Infusions:     LOS: 8 days    Time spent: 35 minutes.     Elmarie Shiley, MD [facility] Pager [phone]  If 7PM-7AM, please contact night-coverage [email or web address] Password [email or web address] 05/20/2019, 1:39 PM

## 2019-05-21 ENCOUNTER — Other Ambulatory Visit: Payer: Self-pay

## 2019-05-21 DIAGNOSIS — R1310 Dysphagia, unspecified: Secondary | ICD-10-CM

## 2019-05-21 LAB — GLUCOSE, CAPILLARY
Glucose-Capillary: 147 mg/dL — ABNORMAL HIGH (ref 70–99)
Glucose-Capillary: 171 mg/dL — ABNORMAL HIGH (ref 70–99)
Glucose-Capillary: 202 mg/dL — ABNORMAL HIGH (ref 70–99)
Glucose-Capillary: 216 mg/dL — ABNORMAL HIGH (ref 70–99)
Glucose-Capillary: 220 mg/dL — ABNORMAL HIGH (ref 70–99)
Glucose-Capillary: 238 mg/dL — ABNORMAL HIGH (ref 70–99)

## 2019-05-21 MED ORDER — ENSURE MAX PROTEIN PO LIQD
11.0000 [oz_av] | Freq: Every day | ORAL | Status: DC
  Administered 2019-05-21 – 2019-06-03 (×11): 11 [oz_av] via ORAL
  Filled 2019-05-21 (×16): qty 330

## 2019-05-21 NOTE — Progress Notes (Signed)
Nutrition Follow-up  DOCUMENTATION CODES:   Morbid obesity  INTERVENTION:   -Continue Magic cup TID with meals, each supplement provides 290 kcal and 9 grams of protein -Ensure Max po daily, each supplement provides 150 kcal and 30 grams of protein.   NUTRITION DIAGNOSIS:   Increased nutrient needs related to acute illness as evidenced by estimated needs.  Ongoing  GOAL:   Patient will meet greater than or equal to 90% of their needs  Progressing   MONITOR:   PO intake, Supplement acceptance, Diet advancement, Labs, Weight trends, Skin, I & O's  REASON FOR ASSESSMENT:   Ventilator    ASSESSMENT:   Patient with PMH significant for OHS, OSA, chronic respiratory failure, DM, CAD, failed CABG 2014, CVA, CKD III-IV, and CHF. Presents this admission with respiratory failure likely related to acute compensation of CHF.  9/25- s/p BSE- advanced to thin liquids (remains on dysphagia 2 diet)  Reviewed I/O's: -4.5 L x 24 hours and -19 L since admission  UOP: 4.8 L since admission  Pt working with therapy at time of visit.   Pt with good appetite; noted meal completion 100%. Pt tolerating dysphagia 2 diet with thin consistency well per SLP notes.   Palliative care following for goals of care; plan to medically optimize pt prior to discharge to hospice/ residential hospice.   Labs reviewed: CBGS: 171-216 (inpatient orders for glycemic control are 0-15 units insulin aspart every 4 hours).   Diet Order:   Diet Order            DIET DYS 2 Room service appropriate? Yes; Fluid consistency: Thin  Diet effective now              EDUCATION NEEDS:   Not appropriate for education at this time  Skin:  Skin Assessment: Skin Integrity Issues: Skin Integrity Issues:: Stage II, DTI DTI: sacrum Stage II: buttocks  Last BM:  05/20/19  Height:   Ht Readings from Last 1 Encounters:  05/12/19 5\' 11"  (1.803 m)    Weight:   Wt Readings from Last 1 Encounters:  05/19/19 (!)  166.9 kg    Ideal Body Weight:  78.2 kg  BMI:  Body mass index is 51.33 kg/m.  Estimated Nutritional Needs:   Kcal:  2100-2300 kcal  Protein:  105-120 grams  Fluid:  >/= 2.1 L/day    Ricardo Kayes A. Jimmye Norman, RD, LDN, [place] Registered Dietitian II Certified Diabetes Care and Education Specialist Pager: [phone] After hours Pager: [phone]

## 2019-05-21 NOTE — Progress Notes (Signed)
PROGRESS NOTE    [name].  XAJ:[id number] DOB: 07-Aug-1961 DOA: 05/12/2019 PCP: Caren Macadam, MD   Brief Narrative: 58 year old with past medical history significant for obesity, OHS, OSA, chronic respiratory failure with hypoxia and hypercapnia baseline 2 to 3 L of oxygen at home, noncompliant with CPAP, insulin-dependent diabetic, A. fib, CAD, failed CABG 2014, CVA, CKD stage III 4, diastolic heart failure presents with 3 days of worsening shortness of breath found to be hypercapnic and hypoxic requiring endotracheal intubation and PCCM admitted patient.  Chest x-ray was consistent with pulmonary edema, PO2 at 78, pH 7.3    Assessment & Plan:   Active Problems:   Acute on chronic respiratory failure with hypoxia and hypercapnia (HCC)   Goals of care, counseling/discussion   Palliative care encounter   Dysphagia   Acute on chronic hypercapnic and hypoxic respiratory failure secondary to decompensated diastolic heart failure, morbid obesity, OSA/OHS Currently on trilogy, CPAP 2 intubations in a month Continue lower home dose torsemide Allergic to prednisone Continue duo nebs, inhalers, cough suppressants  History of chronic A. fib  Rate controlled Continue with Eliquis and amiodarone  Chronic kidney disease stage III Stable, Cr baseline range 2.6---2.7 Daily BMP  Diabetes mellitus type 2 SSI, Accu-Cheks, hypoglycemic protocol  Chronic lower extremity wounds Present on admission. Continue with local care  Morbid obesity Advised lifestyle modification  Stage II buttock decubitus 1 pressure injury Present on admission, wound care  Pressure Injury 04/16/19 Buttocks Left;Lower Stage II -  Partial thickness loss of dermis presenting as a shallow open ulcer with a red, pink wound bed without slough. (Active)  04/16/19 1519  Location: Buttocks  Location Orientation: Left;Lower  Staging: Stage II -  Partial thickness loss of dermis presenting as a  shallow open ulcer with a red, pink wound bed without slough.  Wound Description (Comments):   Present on Admission:      Nutrition Problem: Increased nutrient needs Etiology: acute illness    Signs/Symptoms: estimated needs    Interventions: Magic cup, Premier Protein  Estimated body mass index is 51.33 kg/m as calculated from the following:   Height as of this encounter: _0  (1.803 m).   Weight as of this encounter: 166.9 kg.   DVT prophylaxis: Eliquis Code Status: DNR Family Communication: None at bedside Disposition Plan: Plan for home hospice, patient refusing SNF Consultants:   CCM  Palliative care  Procedures:   None  Antimicrobials:  None  Subjective: Met patient resting comfortably with Trilogy, easily arousable.  Denies any chest pain, abdominal pain, nausea/vomiting, fever/chills, abdominal pain.  Objective: Vitals:   05/20/19 2140 05/21/19 0448 05/21/19 0812 05/21/19 1331  BP:  (!) 141/73  130/77  Pulse: 87 73 87 77  Resp: 20 18 (!) 22 20  Temp:  98.5 F (36.9 C)  98.5 F (36.9 C)  TempSrc:  Oral  Oral  SpO2: 94% 94% 97% 98%  Weight:      Height:        Intake/Output Summary (Last 24 hours) at 05/21/2019 1549 Last data filed at 05/21/2019 0449 Gross per 24 hour  Intake -  Output 2350 ml  Net -2350 ml   Filed Weights   05/17/19 0530 05/18/19 0500 05/19/19 0415  Weight: (!) 167.4 kg (!) 168.3 kg (!) 166.9 kg    Examination:  General: NAD, morbid obesity  Cardiovascular: S1, S2 present  Respiratory: CTAB  Abdomen: Soft, nontender, nondistended, bowel sounds present  Musculoskeletal: bilateral lower extremity with dressing (Ace  wrap)  Skin:  As above  Psychiatry: Normal mood   Data Reviewed: I have personally reviewed following labs and imaging studies  CBC: Recent Labs  Lab 05/15/19 0619 05/16/19 0257  WBC 9.1 7.5  HGB 10.5* 10.5*  HCT 36.0* 37.3*  MCV 102.6* 103.3*  PLT 190 476   Basic Metabolic Panel:  Recent Labs  Lab 05/14/19 1806 05/15/19 0619 05/16/19 0257 05/16/19 0744 05/17/19 0327 05/19/19 1625  NA  --  146*  --  146* 144 140  K  --  3.2*  --  3.4* 3.5 3.5  CL  --  106  --  106 106 98  CO2  --  27  --  32 29 35*  GLUCOSE  --  160*  --  154* 169* 176*  BUN  --  42*  --  36* 29* 24*  CREATININE  --  2.95*  --  2.46* 2.16* 2.18*  CALCIUM  --  8.5*  --  8.9 8.8* 8.6*  MG  --  2.1 2.3  --   --   --   PHOS 3.8 4.1  --   --   --   --    GFR: Estimated Creatinine Clearance: 58.5 mL/min (A) (by C-G formula based on SCr of 2.18 mg/dL (H)). Liver Function Tests: Recent Labs  Lab 05/15/19 0619  AST 14*  ALT 15  ALKPHOS 330*  BILITOT 0.9  PROT 6.3*  ALBUMIN 2.6*   No results for input(s): LIPASE, AMYLASE in the last 168 hours. No results for input(s): AMMONIA in the last 168 hours. Coagulation Profile: No results for input(s): INR, PROTIME in the last 168 hours. Cardiac Enzymes: No results for input(s): CKTOTAL, CKMB, CKMBINDEX, TROPONINI in the last 168 hours. BNP (last 3 results) No results for input(s): PROBNP in the last 8760 hours. HbA1C: No results for input(s): HGBA1C in the last 72 hours. CBG: Recent Labs  Lab 05/20/19 2012 05/21/19 0045 05/21/19 0442 05/21/19 0735 05/21/19 1147  GLUCAP 180* 216* 171* 147* 238*   Lipid Profile: No results for input(s): CHOL, HDL, LDLCALC, TRIG, CHOLHDL, LDLDIRECT in the last 72 hours. Thyroid Function Tests: No results for input(s): TSH, T4TOTAL, FREET4, T3FREE, THYROIDAB in the last 72 hours. Anemia Panel: No results for input(s): VITAMINB12, FOLATE, FERRITIN, TIBC, IRON, RETICCTPCT in the last 72 hours. Sepsis Labs: No results for input(s): PROCALCITON, LATICACIDVEN in the last 168 hours.  Recent Results (from the past 240 hour(s))  Blood Culture (routine x 2)     Status: Abnormal   Collection Time: 05/12/19 10:50 AM   Specimen: BLOOD RIGHT HAND  Result Value Ref Range Status   Specimen Description BLOOD RIGHT  HAND  Final   Special Requests   Final    BOTTLES DRAWN AEROBIC AND ANAEROBIC Blood Culture adequate volume   Culture  Setup Time   Final    GRAM POSITIVE COCCI IN BOTH AEROBIC AND ANAEROBIC BOTTLES CRITICAL RESULT CALLED TO, READ BACK BY AND VERIFIED WITH: Hughie Closs, PHARMD AT [phone] ON 05/13/19 BY C. JESSUP, MT.    Culture (A)  Final    STAPHYLOCOCCUS SPECIES (COAGULASE NEGATIVE) THE SIGNIFICANCE OF ISOLATING THIS ORGANISM FROM A SINGLE SET OF BLOOD CULTURES WHEN MULTIPLE SETS ARE DRAWN IS UNCERTAIN. PLEASE NOTIFY THE MICROBIOLOGY DEPARTMENT WITHIN ONE WEEK IF SPECIATION AND SENSITIVITIES ARE REQUIRED. Performed at [facility], [place] [address], [place], [place] [address]    Report Status 05/15/2019 FINAL  Final  Blood Culture ID Panel (Reflexed)  Status: Abnormal   Collection Time: 05/12/19 10:50 AM  Result Value Ref Range Status   Enterococcus species NOT DETECTED NOT DETECTED Final   Listeria monocytogenes NOT DETECTED NOT DETECTED Final   Staphylococcus species DETECTED (A) NOT DETECTED Final    Comment: Methicillin (oxacillin) resistant coagulase negative staphylococcus. Possible blood culture contaminant (unless isolated from more than one blood culture draw or clinical case suggests pathogenicity). No antibiotic treatment is indicated for blood  culture contaminants. CRITICAL RESULT CALLED TO, READ BACK BY AND VERIFIED WITH: Hughie Closs, PHARMD AT [phone] ON 05/13/19 BY  C. JESSUP, MT.    Staphylococcus aureus (BCID) NOT DETECTED NOT DETECTED Final   Methicillin resistance DETECTED (A) NOT DETECTED Final    Comment: CRITICAL RESULT CALLED TO, READ BACK BY AND VERIFIED WITH: Hughie Closs, PHARMD AT [phone] ON 05/13/19 BY C. JESSUP, MT.    Streptococcus species NOT DETECTED NOT DETECTED Final   Streptococcus agalactiae NOT DETECTED NOT DETECTED Final   Streptococcus pneumoniae NOT DETECTED NOT DETECTED Final   Streptococcus pyogenes NOT DETECTED NOT DETECTED Final   Acinetobacter  baumannii NOT DETECTED NOT DETECTED Final   Enterobacteriaceae species NOT DETECTED NOT DETECTED Final   Enterobacter cloacae complex NOT DETECTED NOT DETECTED Final   Escherichia coli NOT DETECTED NOT DETECTED Final   Klebsiella oxytoca NOT DETECTED NOT DETECTED Final   Klebsiella pneumoniae NOT DETECTED NOT DETECTED Final   Proteus species NOT DETECTED NOT DETECTED Final   Serratia marcescens NOT DETECTED NOT DETECTED Final   Haemophilus influenzae NOT DETECTED NOT DETECTED Final   Neisseria meningitidis NOT DETECTED NOT DETECTED Final   Pseudomonas aeruginosa NOT DETECTED NOT DETECTED Final   Candida albicans NOT DETECTED NOT DETECTED Final   Candida glabrata NOT DETECTED NOT DETECTED Final   Candida krusei NOT DETECTED NOT DETECTED Final   Candida parapsilosis NOT DETECTED NOT DETECTED Final   Candida tropicalis NOT DETECTED NOT DETECTED Final    Comment: Performed at [facility], [place]. [address], [place], [place] [address]  SARS Coronavirus 2 [facility] order, Performed in [facility] hospital lab) Nasopharyngeal Nasopharyngeal Swab     Status: None   Collection Time: 05/12/19 11:01 AM   Specimen: Nasopharyngeal Swab  Result Value Ref Range Status   SARS Coronavirus 2 NEGATIVE NEGATIVE Final    Comment: (NOTE) If result is NEGATIVE SARS-CoV-2 target nucleic acids are NOT DETECTED. The SARS-CoV-2 RNA is generally detectable in upper and lower  respiratory specimens during the acute phase of infection. The lowest  concentration of SARS-CoV-2 viral copies this assay can detect is 250  copies / mL. A negative result does not preclude SARS-CoV-2 infection  and should not be used as the sole basis for treatment or other  patient management decisions.  A negative result may occur with  improper specimen collection / handling, submission of specimen other  than nasopharyngeal swab, presence of viral mutation(s) within the  areas targeted by this assay, and inadequate number of  viral copies  (<250 copies / mL). A negative result must be combined with clinical  observations, patient history, and epidemiological information. If result is POSITIVE SARS-CoV-2 target nucleic acids are DETECTED. The SARS-CoV-2 RNA is generally detectable in upper and lower  respiratory specimens dur ing the acute phase of infection.  Positive  results are indicative of active infection with SARS-CoV-2.  Clinical  correlation with patient history and other diagnostic information is  necessary to determine patient infection status.  Positive results do  not rule  out bacterial infection or co-infection with other viruses. If result is PRESUMPTIVE POSTIVE SARS-CoV-2 nucleic acids MAY BE PRESENT.   A presumptive positive result was obtained on the submitted specimen  and confirmed on repeat testing.  While 2019 novel coronavirus  (SARS-CoV-2) nucleic acids may be present in the submitted sample  additional confirmatory testing may be necessary for epidemiological  and / or clinical management purposes  to differentiate between  SARS-CoV-2 and other Sarbecovirus currently known to infect humans.  If clinically indicated additional testing with an alternate test  methodology [phone]) is advised. The SARS-CoV-2 RNA is generally  detectable in upper and lower respiratory sp ecimens during the acute  phase of infection. The expected result is Negative. Fact Sheet for Patients:  StrictlyIdeas.no Fact Sheet for Healthcare Providers: BankingDealers.co.za This test is not yet approved or cleared by the [place] FDA and has been authorized for detection and/or diagnosis of SARS-CoV-2 by FDA under an Emergency Use Authorization (EUA).  This EUA will remain in effect (meaning this test can be used) for the duration of the COVID-19 declaration under Section 564(b)(1) of the Act, 21 U.S.C. section 360bbb-3(b)(1), unless the authorization is  terminated or revoked sooner. Performed at [facility], [place] [address] [place], [place] [address]   Urine culture     Status: Abnormal   Collection Time: 05/12/19  2:49 PM   Specimen: In/Out Cath Urine  Result Value Ref Range Status   Specimen Description IN/OUT CATH URINE  Final   Special Requests   Final    NONE Performed at [facility], [place] [address] [place], [place] [address]    Culture 60,000 COLONIES/mL PROTEUS PENNERI (A)  Final   Report Status 05/15/2019 FINAL  Final   Organism ID, Bacteria PROTEUS PENNERI (A)  Final      Susceptibility   Proteus penneri - MIC*    AMPICILLIN >=32 RESISTANT Resistant     CEFAZOLIN >=64 RESISTANT Resistant     CEFTRIAXONE >=64 RESISTANT Resistant     CIPROFLOXACIN >=4 RESISTANT Resistant     GENTAMICIN <=1 SENSITIVE Sensitive     IMIPENEM <=0.25 SENSITIVE Sensitive     NITROFURANTOIN 256 RESISTANT Resistant     TRIMETH/SULFA <=20 SENSITIVE Sensitive     AMPICILLIN/SULBACTAM >=32 RESISTANT Resistant     PIP/TAZO <=4 SENSITIVE Sensitive     * 60,000 COLONIES/mL PROTEUS PENNERI  Blood Culture (routine x 2)     Status: None   Collection Time: 05/12/19  4:29 PM   Specimen: BLOOD RIGHT HAND  Result Value Ref Range Status   Specimen Description BLOOD RIGHT HAND  Final   Special Requests   Final    BOTTLES DRAWN AEROBIC ONLY Blood Culture results may not be optimal due to an inadequate volume of blood received in culture bottles   Culture   Final    NO GROWTH 5 DAYS Performed at [facility], [place] [address], [place], [place] [address]    Report Status 05/17/2019 FINAL  Final  MRSA PCR Screening     Status: None   Collection Time: 05/12/19  4:47 PM   Specimen: Nasopharyngeal  Result Value Ref Range Status   MRSA by PCR NEGATIVE NEGATIVE Final    Comment:        The GeneXpert MRSA Assay (FDA approved for NASAL specimens only), is one component of a comprehensive MRSA colonization surveillance program. It is not  intended to diagnose MRSA infection nor to guide or monitor  treatment for MRSA infections. Performed at [facility], [place] [address] [place], [place] [address]          Radiology Studies: No results found.      Scheduled Meds: . amiodarone  100 mg Oral Daily  . apixaban  5 mg Oral BID  . artificial tears   Both Eyes QHS  . budesonide (PULMICORT) nebulizer solution  0.25 mg Nebulization BID  . Chlorhexidine Gluconate Cloth  6 each Topical Daily  . dextromethorphan  30 mg Oral BID  . guaiFENesin  1,200 mg Oral BID  . hydroxypropyl methylcellulose / hypromellose  1 drop Both Eyes TID  . insulin aspart  0-15 Units Subcutaneous Q4H  . ipratropium  0.5 mg Nebulization BID  . levalbuterol  0.63 mg Nebulization BID  . mouth rinse  15 mL Mouth Rinse BID  . pantoprazole  40 mg Oral QHS  . Ensure Max Protein  11 oz Oral Daily  . rosuvastatin  20 mg Oral q1800  . senna  1 tablet Oral QHS  . sodium chloride flush  10-40 mL Intracatheter Q12H  . topiramate  100 mg Oral QHS  . torsemide  80 mg Oral Daily   Continuous Infusions:    LOS: 9 days    Time spent: 35 minutes.     Alma Friendly, MD [facility]   If 7PM-7AM, please contact night-coverage [email or web address] 05/21/2019, 3:49 PM

## 2019-05-21 NOTE — Patient Outreach (Signed)
  [name] [facility]) Care Management Chronic Special Needs Program   05/21/2019  Name: Frank Erney., DOB: [birth year]  MRN: [id number]  The client was discussed in today's interdisciplinary care team meeting.  The following issues were discussed:  Client's needs, Changes in health status, Care Plan, Coordination of care, Care transitions and Issues/barriers to care  Participants present:                    Thea Silversmith, MSN, RN, CCM                     Melissa Sandlin RN,BSN,CCM, CDE  Kelli Churn, RN, CCM, CDE Quinn Plowman RN, BSN, CCM            Marco Collie, MD Gilda Crease, PharmD, RPh Coralie Carpen, MD Mahlon Gammon, RN, MSN, CNS, CCM  Plan: [facility] will continue to follow as client's C-SNP care management coordinator.   Thea Silversmith, RN, MSN, [place] [phone]

## 2019-05-21 NOTE — Progress Notes (Signed)
Occupational Therapy Treatment Patient Details Name: [name]. MRN: [id number] DOB: [birth year] Today's Date: 05/21/2019    History of present illness Pt is a 58 yo male admitted with worsening SOB, found to hypercarbic and hypoxic. ETT 9/21/9/23. PMH OHS, OSA, chronic respiratory failure with hypoxia and hypercapnia, on baseline 2 to 3 L/min home oxygen, noncompliant with CPAP IDDM, Afib, CAD, failed CABG in 2014, CVA, CKD stage 3-4, and diastolic HF   OT comments  Pt at baseline level of function with ADLs and selfcare, Pt planing to return home with hospice. No further acute OT services ar required at this time. All education completed and pt d/c from acute OT services.  Follow Up Recommendations  No OT follow up;Supervision - Intermittent    Equipment Recommendations  None recommended by OT    Recommendations for Other Services      Precautions / Restrictions Precautions Precautions: Fall Precaution Comments: blind in R eye Restrictions Weight Bearing Restrictions: No       Mobility Bed Mobility Overal bed mobility: Needs Assistance Bed Mobility: Supine to Sit Rolling: Min assist;+2 for physical assistance   Supine to sit: Min assist;+2 for safety/equipment     General bed mobility comments: neeeded assist to elevate trunk, increased time and effort, very particular about technique  Transfers Overall transfer level: Needs assistance Equipment used: 2 person hand held assist;Rolling walker (2 wheeled) Transfers: Sit to/from Stand Sit to Stand: +2 physical assistance;From elevated surface;Min assist         General transfer comment: increased time, heavy UE use, needed mod assist to power up and to get steady on his feet with wide BOS and then took side steps to [facility]. Was able to side step toward EOB. Assist to scoot to [facility] of with bed tilted    Balance                                           ADL either performed or assessed  with clinical judgement   ADL Overall ADL's : Needs assistance/impaired Eating/Feeding: Independent;Sitting   Grooming: Set up;Minimal assistance;Sitting;Wash/dry hands;Wash/dry face;Brushing hair   Upper Body Bathing: Minimal assistance;Sitting   Lower Body Bathing: Total assistance;Bed level   Upper Body Dressing : Minimal assistance;Sitting   Lower Body Dressing: Total assistance   Toilet Transfer: +2 for physical assistance;+2 for safety/equipment;Minimal assistance   Toileting- Clothing Manipulation and Hygiene: Total assistance;+2 for safety/equipment;Sit to/from stand       Functional mobility during ADLs: +2 for physical assistance;+2 for safety/equipment;Minimal assistance       Vision Patient Visual Report: No change from baseline;Other (comment)(blind in R eye)     Perception     Praxis      Cognition Arousal/Alertness: Awake/alert Behavior During Therapy: WFL for tasks assessed/performed Overall Cognitive Status: History of cognitive impairments - at baseline                         Following Commands: Follows multi-step commands inconsistently Safety/Judgement: Decreased awareness of safety   Problem Solving: Requires verbal cues          Exercises     Shoulder Instructions       General Comments      Pertinent Vitals/ Pain       Pain Assessment: No/denies pain Faces Pain Scale: No hurt(during SLP activities, reported leg/knee pain  to PT with standing) Pain Intervention(s): Monitored during session  Home Living                                          Prior Functioning/Environment              Frequency  Min 2X/week        Progress Toward Goals  OT Goals(current goals can now be found in the care plan section)  Progress towards OT goals: Goals met/education completed, patient discharged from OT  Acute Rehab OT Goals Patient Stated Goal: to go home OT Goal Formulation: With patient  Plan  Discharge plan remains appropriate    Co-evaluation    PT/OT/SLP Co-Evaluation/Treatment: Yes Reason for Co-Treatment: Complexity of the patient's impairments (multi-system involvement);For patient/therapist safety;To address functional/ADL transfers PT goals addressed during session: Mobility/safety with mobility;Balance OT goals addressed during session: ADL's and self-care;Proper use of Adaptive equipment and DME SLP goals addressed during session: Swallowing    AM-PAC OT "6 Clicks" Daily Activity     Outcome Measure   Help from another person eating meals?: None Help from another person taking care of personal grooming?: A Little Help from another person toileting, which includes using toliet, bedpan, or urinal?: Total Help from another person bathing (including washing, rinsing, drying)?: A Lot Help from another person to put on and taking off regular upper body clothing?: A Lot Help from another person to put on and taking off regular lower body clothing?: Total 6 Click Score: 13    End of Session Equipment Utilized During Treatment: Rolling walker;Oxygen  OT Visit Diagnosis: Unsteadiness on feet (R26.81);Other abnormalities of gait and mobility (R26.89);Other symptoms and signs involving cognitive function   Activity Tolerance Patient tolerated treatment well   Patient Left in bed;with call bell/phone within reach;with family/visitor present   Nurse Communication Mobility status        Time: 1791-5056 OT Time Calculation (min): 73 min  Charges: OT Treatments $Self Care/Home Management : 8-22 mins $Therapeutic Activity: 8-22 mins     [name] 05/21/2019, 3:09 PM

## 2019-05-21 NOTE — Progress Notes (Signed)
Occupational Therapy Discharge Patient Details Name: [name]. MRN: [id number] DOB: 09/30/1960 Today's Date: 05/21/2019 Time: 4784-1282 OT Time Calculation (min): 73 min  Patient discharged from OT services secondary to goals met and no further OT needs identified.  Please see latest therapy progress note for current level of functioning and progress toward goals.    Progress and discharge plan discussed with patient and/or caregiver: Patient/Caregiver agrees with plan  GO     [name] 05/21/2019, 3:11 PM

## 2019-05-21 NOTE — Progress Notes (Signed)
Palliative:  HPI:58 y.o.malewith past medical history of diastolic CHF, CAD, HTN, MI, atrial fib, s/p CABG, stroke, OSA/OHS (does not use CPAP), diabetes, CKD stage 3-4, s/p acoustic neuroma resection with residual temporal neuralgiaadmitted on 9/21/2020with worsening SOB requiring intubation.Optimized with plans for one way extubationcompleted 9/23. Continuing conversations for possible hospice care and overall GOC. So far he is doing well off vent and utilizing trilogy machine that wife has brought from home. Continues to thrive while hospitalized but requiring more oxygen than previously using at home.    I went to check on [name] today but he is sleeping. Has recently worked with therapy and apparently did a little better with therapy today but still with decreased sats with activity but good recovery. I received a call from wife, [name], earlier and she is asking me about how to better reach someone with her insurance? I told her that I was unsure. We discussed again about [name] insisting that he return home but that he would need someone there to be with him just as standby assist, to help with meals, etc. I reiterated to her that insurance does not pay for someone to be there all day and that most of the time aides are not there longer than an hour or so. I encouraged her to speak with family/friends to see if this is a possibility for him to return home as he wishes.   I spoke with [facility] Levada Dy and updated on situation. [name] has been clear that he wishes to continue with current interventions (trilogy, meds) and insists on going home and refusing any facility. His current GOC are not aligned with [facility] anyway. Ideally home with hospice would be an option if family could line up someone to stay with him while wife works. They both agree to hospice at home.   I am unsure what else palliative care can do at this time. We will be available for further conversations with  change in status but goals seem clear from patient. At this point the issue is disposition and I will hand off to [facility]. Will be available as needed to assist.   Exam: Sleeping - I did not arouse. On trilogy, 5L oxygen. No distress. Breathing regular, unlabored. Abd soft.   Plan: - Home with hospice if able to have some with him at home??  20 min  Vinie Sill, NP Palliative Medicine Team Pager [phone] (Please see [email or web address] for schedule) Team Phone [phone]    Greater than 50%  of this time was spent counseling and coordinating care related to the above assessment and plan

## 2019-05-21 NOTE — Progress Notes (Addendum)
  Speech Language Pathology Treatment: Dysphagia  Patient Details Name: [name]. MRN: [id number] DOB: 10-Feb-1961 Today's Date: 05/21/2019 Time: 4098-1191 SLP Time Calculation (min) (ACUTE ONLY): 27 min  Assessment / Plan / Recommendation Clinical Impression  Pt seen for ongoing swallowing therapy.  PT assisted with pt positioning and got pt to edge of bed.  Pt tolerated thin liquid and mechanical soft solids with no clinical s/s of aspiration.  There was anterior spillage of thin liquid by cup. Pt reports he prefers cup to straw and uses paper towel for any escape of thin liquid. There was mild oral residue with small amount of pocketing on R side (prior CVA) which pt was able to clear with liquid wash.  Liquid was was typically independent, but pt benefited from verbal cue x1.  There was a single dry cough during PO trials.  This cough was consistent with baseline cough observed prior to PO trials and pt stated same.  Pt reports eating meat cut into chunks without difficulty (he states during this admission, which seems unlikely, given current chopped diet consistency).  Pt's vitals remained stable throughout session during PO intake.   Recommend advancing to mechanical soft solid with thin liquids. SLP to follow for diet tolerance.    HPI HPI: Pt is a 58 yo male admitted with worsening SOB, found to hypercarbic and hypoxic. ETT 9/21/9/23. Pt has had multiple swallow evaluations in the past with all of them recommending thin liquids; mostly soft to regular solids. PMH OHS, OSA, chronic respiratory failure with hypoxia and hypercapnia, on baseline 2 to 3 L/min home oxygen, noncompliant with CPAP IDDM, Afib, CAD, failed CABG in 2014, CVA, CKD stage 3-4, and diastolic HF      SLP Plan  Continue with current plan of care       Recommendations  Diet recommendations: Dysphagia 3 (mechanical soft);Thin liquid Liquids provided via: Cup;Straw Medication Administration: Whole meds  with liquid Supervision: Patient able to self feed;Staff to assist with self feeding(PRN) Compensations: Slow rate;Small sips/bites;Follow solids with liquid Postural Changes and/or Swallow Maneuvers: Seated upright 90 degrees                Oral Care Recommendations: Oral care BID Follow up Recommendations: (TBD) SLP Visit Diagnosis: Dysphagia, unspecified (R13.10) Plan: Continue with current plan of care       [place], Isabella, [place] Office: [phone] 05/21/2019, 11:42 AM

## 2019-05-21 NOTE — Evaluation (Signed)
Physical Therapy Re-Evaluation Patient Details Name: [name]. MRN: [id number] DOB: 05-23-1961 Today's Date: 05/21/2019   History of Present Illness  Pt is a 58 yo male admitted with worsening SOB, found to hypercarbic and hypoxic. ETT 9/21/9/23. PMH OHS, OSA, chronic respiratory failure with hypoxia and hypercapnia, on baseline 2 to 3 L/min home oxygen, noncompliant with CPAP IDDM, Afib, CAD, failed CABG in 2014, CVA, CKD stage 3-4, and diastolic HF  Clinical Impression   Pt admitted with above diagnosis. Re-order received; Mr. Gonyea is moving better than last PT session, and very much wants to DC home; he got to EOB, and stood to RW with min assist and +2 for safety in hospital; DC home is attainable for him -- noted with poor prognosis, and will likely have hospice follow up;  Pt currently with functional limitations due to the deficits listed below (see PT Problem List). Pt will benefit from skilled PT to increase their independence and safety with mobility to allow discharge to the venue listed below.       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 Restrictions Weight Bearing Restrictions: No      Mobility  Bed Mobility Overal bed mobility: Needs Assistance Bed Mobility: Supine to Sit Rolling: Min assist;+2 for physical assistance   Supine to sit: Min assist;+2 for safety/equipment     General bed mobility comments: neeeded assist to elevate trunk, increased time and effort, very particular about technique  Transfers Overall transfer level: Needs  assistance Equipment used: 2 person hand held assist;Rolling walker (2 wheeled) Transfers: Sit to/from Stand Sit to Stand: +2 physical assistance;From elevated surface;Min assist         General transfer comment: increased time, heavy UE use, needed mod assist to power up and to get steady on his feet with wide BOS and then took side steps to [facility]. Was able to side step toward EOB. Assist to scoot to [facility] of with bed tilted  Ambulation/Gait                Stairs            Wheelchair Mobility    Modified Rankin (Stroke Patients Only)       Balance     Sitting balance-Leahy Scale: Fair       Standing balance-Leahy Scale: Poor Standing balance comment: heavy UE support needed to side step to [facility]                             Pertinent Vitals/Pain Pain Assessment: Faces Faces Pain Scale: Hurts a little bit Pain Location: knee pain with standing Pain Descriptors / Indicators: Aching Pain Intervention(s): Monitored during session    Home Living Family/patient expects to be discharged to:: Private residence Living Arrangements: Spouse/significant other Available Help at Discharge: Family;Available PRN/intermittently Type of Home: Mobile home Home Access: Ramped entrance     Home Layout: One level Home Equipment: [place] - 4 wheels;Bedside commode;Wheelchair - manual Additional Comments: sponge bathes, sleeps in recliner    Prior Function Level of Independence: Needs assistance   Gait / Transfers Assistance Needed:  Ambulates with rollator  ADL's / Homemaking Assistance Needed: assisted for sponge bathing and uses toilet aide for pericare, can dress independently  Comments: on 3L of O2 all the time     Hand Dominance   Dominant Hand: Right    Extremity/Trunk Assessment   Upper Extremity Assessment Upper Extremity Assessment: Defer to OT evaluation    Lower Extremity Assessment Lower Extremity Assessment: Generalized weakness;RLE  deficits/detail;LLE deficits/detail RLE Deficits / Details: movement limited by body habitus LLE Deficits / Details: movement limited by body habitus     Cervical / Trunk Assessment Cervical / Trunk Assessment: Other exceptions Cervical / Trunk Exceptions: morbid obesity  Communication   Communication: HOH;Expressive difficulties  Cognition Arousal/Alertness: Awake/alert Behavior During Therapy: WFL for tasks assessed/performed Overall Cognitive Status: History of cognitive impairments - at baseline                         Following Commands: Follows multi-step commands inconsistently Safety/Judgement: Decreased awareness of safety   Problem Solving: Requires verbal cues General Comments: Very controlling behaviors noted      General Comments General comments (skin integrity, edema, etc.): Session conducted on 5L supplemental O2, and O2 sats mostly stayed at or above 93%; Notable exception to this is when he particpates in effortful movement, for instance, desatted to 78% with getting to EOB; recovered relatively quickly, within 3 minutes    Exercises     Assessment/Plan    PT Assessment Patient needs continued PT services  PT Problem List Decreased strength;Decreased activity tolerance;Decreased balance;Decreased mobility;Decreased cognition;Decreased safety awareness       PT Treatment Interventions DME instruction;Gait training;Functional mobility training;Therapeutic activities;Therapeutic exercise;Balance training;Patient/family education    PT Goals (Current goals can be found in the Care Plan section)  Acute Rehab PT Goals Patient Stated Goal: to go home PT Goal Formulation: With patient Time For Goal Achievement: 06/04/19 Potential to Achieve Goals: Fair    Frequency Min 3X/week   Barriers to discharge Decreased caregiver support(wife works)      Co-evaluation PT/OT/SLP Co-Evaluation/Treatment: Yes Reason for Co-Treatment: Complexity of the  patient's impairments (multi-system involvement);For patient/therapist safety;To address functional/ADL transfers PT goals addressed during session: Mobility/safety with mobility OT goals addressed during session: ADL's and self-care SLP goals addressed during session: Swallowing     AM-PAC PT "6 Clicks" Mobility  Outcome Measure Help needed turning from your back to your side while in a flat bed without using bedrails?: A Lot Help needed moving from lying on your back to sitting on the side of a flat bed without using bedrails?: A Lot Help needed moving to and from a bed to a chair (including a wheelchair)?: A Lot Help needed standing up from a chair using your arms (e.g., wheelchair or bedside chair)?: A Little Help needed to walk in hospital room?: A Lot Help needed climbing 3-5 steps with a railing? : A Lot 6 Click Score: 13    End of Session Equipment Utilized During Treatment: Oxygen;Gait belt Activity Tolerance: Patient limited by fatigue Patient left: in bed;with call bell/phone within reach Nurse Communication: Mobility status PT Visit Diagnosis: Muscle weakness (generalized) (M62.81);Difficulty in walking, not elsewhere classified (R26.2);Unsteadiness on feet (R26.81)    Time: 6073-7106 PT Time Calculation (min) (ACUTE ONLY): 73 min   Charges:   PT Evaluation $PT Re-evaluation: 1 Re-eval PT Treatments $Therapeutic Activity: 8-22 mins        Roney Marion, PT  Acute Rehabilitation Services Pager [phone] Office [phone]  Colletta [place] 05/21/2019, 4:29 PM

## 2019-05-22 LAB — BASIC METABOLIC PANEL
Anion gap: 11 (ref 5–15)
BUN: 22 mg/dL — ABNORMAL HIGH (ref 6–20)
CO2: 33 mmol/L — ABNORMAL HIGH (ref 22–32)
Calcium: 8.9 mg/dL (ref 8.9–10.3)
Chloride: 95 mmol/L — ABNORMAL LOW (ref 98–111)
Creatinine, Ser: 2.1 mg/dL — ABNORMAL HIGH (ref 0.61–1.24)
GFR calc Af Amer: 39 mL/min — ABNORMAL LOW (ref >60)
GFR calc non Af Amer: 34 mL/min — ABNORMAL LOW (ref >60)
Glucose, Bld: 172 mg/dL — ABNORMAL HIGH (ref 70–99)
Potassium: 3.2 mmol/L — ABNORMAL LOW (ref 3.5–5.1)
Sodium: 139 mmol/L (ref 135–145)

## 2019-05-22 LAB — CBC WITH DIFFERENTIAL/PLATELET
Abs Immature Granulocytes: 0.39 10*3/uL — ABNORMAL HIGH (ref 0.00–0.07)
Basophils Absolute: 0.1 10*3/uL (ref 0.0–0.1)
Basophils Relative: 1 %
Eosinophils Absolute: 0.4 10*3/uL (ref 0.0–0.5)
Eosinophils Relative: 4 %
HCT: 37.3 % — ABNORMAL LOW (ref 39.0–52.0)
Hemoglobin: 11.1 g/dL — ABNORMAL LOW (ref 13.0–17.0)
Immature Granulocytes: 4 %
Lymphocytes Relative: 18 %
Lymphs Abs: 1.8 10*3/uL (ref 0.7–4.0)
MCH: 29.5 pg (ref 26.0–34.0)
MCHC: 29.8 g/dL — ABNORMAL LOW (ref 30.0–36.0)
MCV: 99.2 fL (ref 80.0–100.0)
Monocytes Absolute: 1.1 10*3/uL — ABNORMAL HIGH (ref 0.1–1.0)
Monocytes Relative: 11 %
Neutro Abs: 6.3 10*3/uL (ref 1.7–7.7)
Neutrophils Relative %: 62 %
Platelets: 322 10*3/uL (ref 150–400)
RBC: 3.76 MIL/uL — ABNORMAL LOW (ref 4.22–5.81)
RDW: 13.7 % (ref 11.5–15.5)
WBC: 10.1 10*3/uL (ref 4.0–10.5)
nRBC: 0.2 % (ref 0.0–0.2)

## 2019-05-22 LAB — GLUCOSE, CAPILLARY
Glucose-Capillary: 171 mg/dL — ABNORMAL HIGH (ref 70–99)
Glucose-Capillary: 174 mg/dL — ABNORMAL HIGH (ref 70–99)
Glucose-Capillary: 190 mg/dL — ABNORMAL HIGH (ref 70–99)
Glucose-Capillary: 196 mg/dL — ABNORMAL HIGH (ref 70–99)
Glucose-Capillary: 202 mg/dL — ABNORMAL HIGH (ref 70–99)
Glucose-Capillary: 225 mg/dL — ABNORMAL HIGH (ref 70–99)
Glucose-Capillary: 267 mg/dL — ABNORMAL HIGH (ref 70–99)

## 2019-05-22 MED ORDER — DM-GUAIFENESIN ER 30-600 MG PO TB12
2.0000 | ORAL_TABLET | Freq: Two times a day (BID) | ORAL | Status: DC
  Administered 2019-05-22 – 2019-06-05 (×27): 2 via ORAL
  Filled 2019-05-22 (×24): qty 2
  Filled 2019-05-22: qty 1
  Filled 2019-05-22 (×2): qty 2

## 2019-05-22 MED ORDER — POTASSIUM CHLORIDE CRYS ER 20 MEQ PO TBCR
40.0000 meq | EXTENDED_RELEASE_TABLET | Freq: Once | ORAL | Status: AC
  Administered 2019-05-22: 40 meq via ORAL
  Filled 2019-05-22: qty 2

## 2019-05-22 NOTE — Progress Notes (Signed)
Manufacturing engineer [facility]) Hospice  Received phone call from RN Case Manager to determine if hospice eligible.    Contacted wife, she feels like she has not been kept in the loop with plan.  Per PT note, pt may benefit from additional PT.  Discussed this with wife.  She would like to see if he would qualify for rehab services at [facility].  Advised her we can follow from a community based palliative standpoint, and if he does complete rehab and go home, we can re-evaluate for hospice at that time.  Venia Carbon RN, BSN, CCRN [place] Liaison (in [place]) [phone]

## 2019-05-22 NOTE — NC FL2 (Signed)
[place] LEVEL OF CARE SCREENING TOOL     IDENTIFICATION  Patient Name: [name]. Birthdate: 1960/11/13 Sex: male Admission Date (Current Location): 05/12/2019  [facility] and [place] Number:  Herbalist and Address:  [facility], [place] [address], [place], [place] [address]      Provider Number: [id number]  Attending Physician Name and Address:  Alma Friendly, MD  Relative Name and Phone Number:  [name]), spouse [phone]    Current Level of Care: Hospital Recommended Level of Care: [place] Prior Approval Number:    Date Approved/Denied:   PASRR Number: [id number]  Discharge Plan: SNF    Current Diagnoses: Patient Active Problem List   Diagnosis Date Noted  . Dysphagia   . Goals of care, counseling/discussion   . Palliative care encounter   . CKD (chronic kidney disease), stage IV ([place]) 04/23/2019  . OSA (obstructive sleep apnea)   . Respiratory failure ([place]) 03/22/2019  . Morbid obesity with BMI of 50.0-59.9, adult ([place])   . Acute on chronic diastolic heart failure ([place]) 02/23/2019  . Acute renal failure with acute renal cortical necrosis superimposed on stage 4 chronic kidney disease ([place])   . Acute respiratory failure with hypoxia and hypercapnia ([place]) 02/22/2019  . SOB (shortness of breath) 02/22/2019  . Chronic obstructive pulmonary disease ([place]) 02/22/2019  . CKD stage 3 secondary to diabetes ([place]) 09/14/2018  . IDDM (insulin dependent diabetes mellitus) 09/14/2018  . Chronic diastolic heart failure ([place]) 05/20/2018  . Acute diastolic heart failure ([place])   . Atrial fibrillation with RVR ([place])   . Carbon dioxide retention   . AKI (acute kidney injury) ([place])   . Pressure injury of skin 01/29/2017  . Hereditary and idiopathic peripheral neuropathy 01/08/2017  . Acute on chronic respiratory failure with hypoxia and hypercapnia ([place]) 02/16/2016  . Conjunctivitis  10/18/2015  . 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 Headache due to intracranial disease 10/13/2013  . Edema 12/17/2012  . 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 Status post craniotomy 10/12/2012  . CAD (coronary artery disease) of artery bypass graft 09/09/2012  . 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 Obesity, Class III, BMI 40-49.9 (morbid obesity) ([place]) 09/09/2012    Orientation RESPIRATION BLADDER Height & Weight     Self, Time, Situation, Place  O2(Hiflo 5L; Trillogy machine from home) Incontinent, External catheter Weight: (!) 166.9 kg Height:  5\' 11"  (180.3 cm)  BEHAVIORAL SYMPTOMS/MOOD NEUROLOGICAL BOWEL NUTRITION STATUS      Incontinent Diet(Please see DC Summary)  AMBULATORY STATUS COMMUNICATION OF NEEDS Skin   Extensive Assist Verbally Other (Comment)(Venous stasis ulcer on leg)                       Personal Care Assistance Level of Assistance  Bathing, Feeding, Dressing Bathing Assistance: Maximum assistance Feeding assistance: Limited assistance Dressing Assistance: Limited assistance     Functional Limitations Info  Sight, Hearing, Speech Sight Info: Impaired Hearing Info: Impaired Speech Info: Adequate    SPECIAL CARE FACTORS FREQUENCY  PT (By licensed PT), OT (By licensed OT)     PT Frequency: 5x OT Frequency: 3x            Contractures Contractures Info: Not present    Additional Factors Info  Code Status, Allergies Code Status Info: DNR Allergies Info: Morphine And Related, Novocain (Procaine), Carbamazepine, Codeine, Gabapentin, Ivp Dye (Iodinated  Diagnostic Agents), Other, Prednisone           Current Medications (05/22/2019):  This is the current hospital active medication list Current Facility-Administered Medications  Medication Dose Route Frequency Provider Last Rate Last Dose  . acetaminophen (TYLENOL) tablet 650 mg  650 mg Oral Q4H PRN Regalado, Belkys A, MD      .  amiodarone (PACERONE) tablet 100 mg  100 mg Oral Daily Regalado, Belkys A, MD   100 mg at 05/22/19 0853  . apixaban (ELIQUIS) tablet 5 mg  5 mg Oral BID Regalado, Belkys A, MD   5 mg at 05/22/19 0853  . artificial tears (LACRILUBE) ophthalmic ointment   Both Eyes QHS Vinie Sill C, NP      . budesonide (PULMICORT) nebulizer solution 0.25 mg  0.25 mg Nebulization BID Regalado, Belkys A, MD   0.25 mg at 05/22/19 0827  . Chlorhexidine Gluconate Cloth 2 % PADS 6 each  6 each Topical Daily Regalado, Belkys A, MD   6 each at 05/22/19 1000  . dextromethorphan-guaiFENesin (MUCINEX DM) 30-600 MG per 12 hr tablet 2 tablet  2 tablet Oral BID Alma Friendly, MD      . fentaNYL (SUBLIMAZE) injection 50 mcg  50 mcg Intravenous Q1H PRN Regalado, Belkys A, MD   50 mcg at 05/14/19 2210  . HYDROcodone-acetaminophen (NORCO) 10-325 MG per tablet 1 tablet  1 tablet Oral R5J PRN Pershing Proud, NP   1 tablet at 05/22/19 1449  . hydroxypropyl methylcellulose / hypromellose (ISOPTO TEARS / GONIOVISC) 2.5 % ophthalmic solution 1 drop  1 drop Both Eyes TID Pershing Proud, NP   1 drop at 05/22/19 0855  . insulin aspart (novoLOG) injection 0-15 Units  0-15 Units Subcutaneous Q4H Regalado, Belkys A, MD   8 Units at 05/22/19 1232  . ipratropium (ATROVENT) nebulizer solution 0.5 mg  0.5 mg Nebulization BID Regalado, Belkys A, MD   0.5 mg at 05/22/19 0827  . levalbuterol (XOPENEX) nebulizer solution 0.63 mg  0.63 mg Nebulization BID Regalado, Belkys A, MD   0.63 mg at 05/22/19 0827  . LORazepam (ATIVAN) injection 0.5 mg  0.5 mg Intravenous Q4H PRN Regalado, Belkys A, MD      . MEDLINE mouth rinse  15 mL Mouth Rinse BID Regalado, Belkys A, MD   15 mL at 05/21/19 2151  . nitroGLYCERIN (NITROSTAT) SL tablet 0.4 mg  0.4 mg Sublingual Q5 min PRN Regalado, Belkys A, MD      . pantoprazole (PROTONIX) EC tablet 40 mg  40 mg Oral QHS Henri Medal, RPH   40 mg at 05/21/19 2148  . protein supplement (ENSURE MAX) liquid  11 oz  Oral Daily Alma Friendly, MD   11 oz at 05/21/19 1556  . Resource ThickenUp Clear   Oral PRN Schorr, Rhetta Mura, NP      . rosuvastatin (CRESTOR) tablet 20 mg  20 mg Oral q1800 Regalado, Belkys A, MD   20 mg at 05/21/19 1749  . senna (SENOKOT) tablet 8.6 mg  1 tablet Oral QHS Pershing Proud, NP   8.6 mg at 05/21/19 2148  . sodium chloride flush (NS) 0.9 % injection 10-40 mL  10-40 mL Intracatheter Q12H Regalado, Belkys A, MD   10 mL at 05/19/19 2137  . sodium chloride flush (NS) 0.9 % injection 10-40 mL  10-40 mL Intracatheter PRN Regalado, Belkys A, MD   20 mL at 05/19/19 1632  . tiZANidine (ZANAFLEX) tablet 4 mg  4 mg  Oral M2T PRN Pershing Proud, NP   4 mg at 05/15/19 1928  . topiramate (TOPAMAX) tablet 100 mg  100 mg Oral QHS Regalado, Belkys A, MD   100 mg at 05/21/19 2148  . torsemide (DEMADEX) tablet 80 mg  80 mg Oral Daily Regalado, Belkys A, MD   80 mg at 05/22/19 9471     Discharge Medications: Please see discharge summary for a list of discharge medications.  Relevant Imaging Results:  Relevant Lab Results:   Additional Information SSN: [id number]  Sharin Mons, RN

## 2019-05-22 NOTE — Progress Notes (Signed)
Patient has home triology at bedside.  Patient places self on and off.  RT assistance not needed at this time.

## 2019-05-22 NOTE — Plan of Care (Signed)
  Problem: Clinical Measurements: Goal: Ability to maintain clinical measurements within normal limits will improve Outcome: Progressing Goal: Will remain free from infection Outcome: Progressing Goal: Diagnostic test results will improve Outcome: Progressing Goal: Respiratory complications will improve Outcome: Progressing Goal: Cardiovascular complication will be avoided Outcome: Progressing   Problem: Activity: Goal: Risk for activity intolerance will decrease Outcome: Not Progressing Note: No progress towards goals; exercise intolerance noted   Problem: Coping: Goal: Level of anxiety will decrease Outcome: Not Progressing Note: No progress towards goal; patient remains anxious and irritable during shift   Problem: Pain Managment: Goal: General experience of comfort will improve Outcome: Progressing   Problem: Skin Integrity: Goal: Risk for impaired skin integrity will decrease Outcome: Progressing   Problem: Metabolic: Goal: Ability to maintain appropriate glucose levels will improve Outcome: Not Progressing Note: Elevated blood glucose noted   Problem: Nutritional: Goal: Maintenance of adequate nutrition will improve Outcome: Not Progressing Note: Patient not compliant with prescribed diet.  Patient is on a dysphagia diet, patient continues to eat candies and snack. Goal: Progress toward achieving an optimal weight will improve Outcome: Not Progressing Note: No progress made towards goals.  Patient remains morbidly obese.

## 2019-05-22 NOTE — Progress Notes (Signed)
PROGRESS NOTE    [name].  NWG:[id number] DOB: 03/04/1961 DOA: 05/12/2019 PCP: Caren Macadam, MD   Brief Narrative: 58 year old with past medical history significant for obesity, OHS, OSA, chronic respiratory failure with hypoxia and hypercapnia baseline 2 to 3 L of oxygen at home, noncompliant with CPAP, insulin-dependent diabetic, A. fib, CAD, failed CABG 2014, CVA, CKD stage III 4, diastolic heart failure presents with 3 days of worsening shortness of breath found to be hypercapnic and hypoxic requiring endotracheal intubation and PCCM admitted patient.  Chest x-ray was consistent with pulmonary edema, PO2 at 78, pH 7.3  Assessment & Plan:   Active Problems:   Acute on chronic respiratory failure with hypoxia and hypercapnia (HCC)   Goals of care, counseling/discussion   Palliative care encounter   Dysphagia   Acute on chronic hypercapnic and hypoxic respiratory failure secondary to decompensated diastolic heart failure, morbid obesity, OSA/OHS Currently on trilogy, CPAP 2 intubations in a month Continue lower home dose torsemide Allergic to prednisone Continue duo nebs, inhalers, cough suppressants  Hypokalemia Replace prn  History of chronic A. fib  Rate controlled Continue with Eliquis and amiodarone  Chronic kidney disease stage III Stable, Cr baseline range 2.6---2.7 Daily BMP  Diabetes mellitus type 2 SSI, Accu-Cheks, hypoglycemic protocol  Chronic lower extremity wounds Present on admission. Continue with local care  Morbid obesity Advised lifestyle modification  Stage II buttock decubitus 1 pressure injury Present on admission, wound care      Nutrition Problem: Increased nutrient needs Etiology: acute illness    Signs/Symptoms: estimated needs    Interventions: Magic cup, Premier Protein  Estimated body mass index is 51.33 kg/m as calculated from the following:   Height as of this encounter: 5' 11"  (1.803 m).  Weight as of this encounter: 166.9 kg.   DVT prophylaxis: Eliquis Code Status: DNR Family Communication: None at bedside Disposition Plan: Likely SNF Consultants:   CCM  Palliative care  Procedures:   Mechanical ventilation  Antimicrobials:  None  Subjective: Pt denies any new complaints. Met pt on his trilogy, resting comfortable  Objective: Vitals:   05/22/19 0827 05/22/19 0831 05/22/19 0833 05/22/19 1222  BP:    (!) 145/74  Pulse:    85  Resp:    18  Temp:    98 F (36.7 C)  TempSrc:    Oral  SpO2: 96% 99% 98% 98%  Weight:      Height:        Intake/Output Summary (Last 24 hours) at 05/22/2019 1311 Last data filed at 05/22/2019 1100 Gross per 24 hour  Intake 480 ml  Output 3750 ml  Net -3270 ml   Filed Weights   05/17/19 0530 05/18/19 0500 05/19/19 0415  Weight: (!) 167.4 kg (!) 168.3 kg (!) 166.9 kg    Examination:  General: NAD, morbid obesity   Cardiovascular: S1, S2 present  Respiratory: Diminished BS b/l  Abdomen: Soft, nontender, nondistended, bowel sounds present  Musculoskeletal: bilateral LE with Ace wrap  Skin: As above  Psychiatry: Normal mood  Data Reviewed: I have personally reviewed following labs and imaging studies  CBC: Recent Labs  Lab 05/16/19 0257 05/22/19 0248  WBC 7.5 10.1  NEUTROABS  --  6.3  HGB 10.5* 11.1*  HCT 37.3* 37.3*  MCV 103.3* 99.2  PLT 212 578   Basic Metabolic Panel: Recent Labs  Lab 05/16/19 0257 05/16/19 0744 05/17/19 0327 05/19/19 1625 05/22/19 0248  NA  --  146* 144 140 139  K  --  3.4* 3.5 3.5 3.2*  CL  --  106 106 98 95*  CO2  --  32 29 35* 33*  GLUCOSE  --  154* 169* 176* 172*  BUN  --  36* 29* 24* 22*  CREATININE  --  2.46* 2.16* 2.18* 2.10*  CALCIUM  --  8.9 8.8* 8.6* 8.9  MG 2.3  --   --   --   --    GFR: Estimated Creatinine Clearance: 60.7 mL/min (A) (by C-G formula based on SCr of 2.1 mg/dL (H)). Liver Function Tests: No results for input(s): AST, ALT, ALKPHOS, BILITOT,  PROT, ALBUMIN in the last 168 hours. No results for input(s): LIPASE, AMYLASE in the last 168 hours. No results for input(s): AMMONIA in the last 168 hours. Coagulation Profile: No results for input(s): INR, PROTIME in the last 168 hours. Cardiac Enzymes: No results for input(s): CKTOTAL, CKMB, CKMBINDEX, TROPONINI in the last 168 hours. BNP (last 3 results) No results for input(s): PROBNP in the last 8760 hours. HbA1C: No results for input(s): HGBA1C in the last 72 hours. CBG: Recent Labs  Lab 05/21/19 2036 05/22/19 0007 05/22/19 0420 05/22/19 0754 05/22/19 1152  GLUCAP 202* 196* 225* 190* 267*   Lipid Profile: No results for input(s): CHOL, HDL, LDLCALC, TRIG, CHOLHDL, LDLDIRECT in the last 72 hours. Thyroid Function Tests: No results for input(s): TSH, T4TOTAL, FREET4, T3FREE, THYROIDAB in the last 72 hours. Anemia Panel: No results for input(s): VITAMINB12, FOLATE, FERRITIN, TIBC, IRON, RETICCTPCT in the last 72 hours. Sepsis Labs: No results for input(s): PROCALCITON, LATICACIDVEN in the last 168 hours.  Recent Results (from the past 240 hour(s))  Urine culture     Status: Abnormal   Collection Time: 05/12/19  2:49 PM   Specimen: In/Out Cath Urine  Result Value Ref Range Status   Specimen Description IN/OUT CATH URINE  Final   Special Requests   Final    NONE Performed at [facility], [address], [place], [place] [address]    Culture 60,000 COLONIES/mL PROTEUS PENNERI (A)  Final   Report Status 05/15/2019 FINAL  Final   Organism ID, Bacteria PROTEUS PENNERI (A)  Final      Susceptibility   Proteus penneri - MIC*    AMPICILLIN >=32 RESISTANT Resistant     CEFAZOLIN >=64 RESISTANT Resistant     CEFTRIAXONE >=64 RESISTANT Resistant     CIPROFLOXACIN >=4 RESISTANT Resistant     GENTAMICIN <=1 SENSITIVE Sensitive     IMIPENEM <=0.25 SENSITIVE Sensitive     NITROFURANTOIN 256 RESISTANT Resistant     TRIMETH/SULFA <=20 SENSITIVE Sensitive      AMPICILLIN/SULBACTAM >=32 RESISTANT Resistant     PIP/TAZO <=4 SENSITIVE Sensitive     * 60,000 COLONIES/mL PROTEUS PENNERI  Blood Culture (routine x 2)     Status: None   Collection Time: 05/12/19  4:29 PM   Specimen: BLOOD RIGHT HAND  Result Value Ref Range Status   Specimen Description BLOOD RIGHT HAND  Final   Special Requests   Final    BOTTLES DRAWN AEROBIC ONLY Blood Culture results may not be optimal due to an inadequate volume of blood received in culture bottles   Culture   Final    NO GROWTH 5 DAYS Performed at [facility], [place] [address], [place], [place] [address]    Report Status 05/17/2019 FINAL  Final  MRSA PCR Screening     Status: None   Collection Time: 05/12/19  4:47 PM  Specimen: Nasopharyngeal  Result Value Ref Range Status   MRSA by PCR NEGATIVE NEGATIVE Final    Comment:        The GeneXpert MRSA Assay (FDA approved for NASAL specimens only), is one component of a comprehensive MRSA colonization surveillance program. It is not intended to diagnose MRSA infection nor to guide or monitor treatment for MRSA infections. Performed at [facility], [place] [address] [place], [place] [address]          Radiology Studies: No results found.      Scheduled Meds: . amiodarone  100 mg Oral Daily  . apixaban  5 mg Oral BID  . artificial tears   Both Eyes QHS  . budesonide (PULMICORT) nebulizer solution  0.25 mg Nebulization BID  . Chlorhexidine Gluconate Cloth  6 each Topical Daily  . dextromethorphan-guaiFENesin  2 tablet Oral BID  . hydroxypropyl methylcellulose / hypromellose  1 drop Both Eyes TID  . insulin aspart  0-15 Units Subcutaneous Q4H  . ipratropium  0.5 mg Nebulization BID  . levalbuterol  0.63 mg Nebulization BID  . mouth rinse  15 mL Mouth Rinse BID  . pantoprazole  40 mg Oral QHS  . Ensure Max Protein  11 oz Oral Daily  . rosuvastatin  20 mg Oral q1800  . senna  1 tablet Oral QHS  . sodium chloride flush  10-40 mL  Intracatheter Q12H  . topiramate  100 mg Oral QHS  . torsemide  80 mg Oral Daily   Continuous Infusions:    LOS: 10 days    Time spent: 35 minutes.     Alma Friendly, MD [facility]   If 7PM-7AM, please contact night-coverage [email or web address] 05/22/2019, 1:11 PM

## 2019-05-23 LAB — BASIC METABOLIC PANEL
Anion gap: 11 (ref 5–15)
BUN: 21 mg/dL — ABNORMAL HIGH (ref 6–20)
CO2: 36 mmol/L — ABNORMAL HIGH (ref 22–32)
Calcium: 8.8 mg/dL — ABNORMAL LOW (ref 8.9–10.3)
Chloride: 93 mmol/L — ABNORMAL LOW (ref 98–111)
Creatinine, Ser: 2.2 mg/dL — ABNORMAL HIGH (ref 0.61–1.24)
GFR calc Af Amer: 37 mL/min — ABNORMAL LOW (ref >60)
GFR calc non Af Amer: 32 mL/min — ABNORMAL LOW (ref >60)
Glucose, Bld: 153 mg/dL — ABNORMAL HIGH (ref 70–99)
Potassium: 3.3 mmol/L — ABNORMAL LOW (ref 3.5–5.1)
Sodium: 140 mmol/L (ref 135–145)

## 2019-05-23 LAB — GLUCOSE, CAPILLARY
Glucose-Capillary: 144 mg/dL — ABNORMAL HIGH (ref 70–99)
Glucose-Capillary: 210 mg/dL — ABNORMAL HIGH (ref 70–99)
Glucose-Capillary: 251 mg/dL — ABNORMAL HIGH (ref 70–99)
Glucose-Capillary: 190 mg/dL — ABNORMAL HIGH (ref 70–99)
Glucose-Capillary: 197 mg/dL — ABNORMAL HIGH (ref 70–99)

## 2019-05-23 MED ORDER — POTASSIUM CHLORIDE CRYS ER 20 MEQ PO TBCR: 40.0000 meq | EXTENDED_RELEASE_TABLET | Freq: Once | ORAL | Status: DC

## 2019-05-23 MED ORDER — LEVALBUTEROL HCL 0.63 MG/3ML IN NEBU
0.6300 mg | INHALATION_SOLUTION | Freq: Four times a day (QID) | RESPIRATORY_TRACT | Status: DC | PRN
  Administered 2019-05-23: 0.63 mg via RESPIRATORY_TRACT

## 2019-05-23 MED ORDER — POTASSIUM CHLORIDE CRYS ER 20 MEQ PO TBCR
40.0000 meq | EXTENDED_RELEASE_TABLET | Freq: Every day | ORAL | Status: DC
  Administered 2019-05-23 – 2019-05-29 (×7): 40 meq via ORAL
  Filled 2019-05-23 (×7): qty 2

## 2019-05-23 NOTE — Progress Notes (Signed)
On arrival patient is on his home unit CPAP

## 2019-05-23 NOTE — Progress Notes (Signed)
  Speech Language Pathology Treatment: Dysphagia  Patient Details Name: [name]. MRN: [id number] DOB: 1961/03/17 Today's Date: 05/23/2019 Time: 7371-0626 SLP Time Calculation (min) (ACUTE ONLY): 43 min  Assessment / Plan / Recommendation Clinical Impression  Pt seen for ongoing swallowing therapy. Pt seen with PT to get pt from bed to chair in order to eat midday meal tray.  Pt consumed mechanical soft meal tray and thin liquid by straw independently with no clinical s/s of aspiration. Pt exhibited good oral clearance of solids.  Recommend continuing mechanical soft diet with thin liquids.  Pt has no further ST needs.  SLP will sign off.    HPI HPI: Pt is a 58 yo male admitted with worsening SOB, found to hypercarbic and hypoxic. ETT 9/21/9/23. Pt has had multiple swallow evaluations in the past with all of them recommending thin liquids; mostly soft to regular solids. PMH OHS, OSA, chronic respiratory failure with hypoxia and hypercapnia, on baseline 2 to 3 L/min home oxygen, noncompliant with CPAP IDDM, Afib, CAD, failed CABG in 2014, CVA, CKD stage 3-4, and diastolic HF      SLP Plan  All goals met       Recommendations  Liquids provided via: Cup;Straw Medication Administration: Whole meds with liquid Supervision: Patient able to self feed;Staff to assist with self feeding Compensations: Slow rate;Small sips/bites;Follow solids with liquid Postural Changes and/or Swallow Maneuvers: Seated upright 90 degrees                Oral Care Recommendations: Oral care BID Follow up Recommendations: None SLP Visit Diagnosis: Dysphagia, unspecified (R13.10) Plan: All goals met       [place], MA, [place] Office: [phone]; Pager (10/2): [phone] 05/23/2019, 12:52 PM

## 2019-05-23 NOTE — Progress Notes (Signed)
Physical Therapy Treatment Patient Details Name: [name]. MRN: [id number] DOB: 1960-10-25 Today's Date: 05/23/2019    History of Present Illness Pt is a 58 yo male admitted with worsening SOB, found to hypercarbic and hypoxic. ETT 9/21/9/23. PMH OHS, OSA, chronic respiratory failure with hypoxia and hypercapnia, on baseline 2 to 3 L/min home oxygen, noncompliant with CPAP IDDM, Afib, CAD, failed CABG in 2014, CVA, CKD stage 3-4, and diastolic HF    PT Comments    Pt was seen for bed mob initially and then returned to work on transfer to chair with ROM of legs after.  Pt is tolerant of being moved, and was very motivated to stand from bedside to use a RW.  He is up in chair with nursing inserviced for transfer back to bed, either supine or AP sitting transfer.  Follow him for increasing gait and balance in standing and monitor his distances as walking between rooms is his PLOF.   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 Restrictions Weight Bearing Restrictions: No    Mobility  Bed Mobility Overal bed mobility: Needs Assistance Bed Mobility: Supine to Sit Rolling: Mod assist   Supine to sit: Mod assist     General bed mobility comments: assisted to lift trunk, used bed pad to assist scooting to EOB  Transfers Overall transfer level: Needs assistance Equipment used: Rolling walker (2 wheeled);1 person hand held assist Transfers: Sit to/from Stand Sit to Stand: Min assist         General transfer comment: pt was able to scoot out to EOB and pull up to stand, used RW for support to pivot to get to recliner  Ambulation/Gait Ambulation/Gait assistance: Min guard Gait Distance (Feet): 5 Feet Assistive device: Rolling walker (2  wheeled);1 person hand held assist Gait Pattern/deviations: Step-to pattern;Step-through pattern;Decreased stride length;Wide base of support Gait velocity: decreased Gait velocity interpretation: <1.31 ft/sec, indicative of household ambulator General Gait Details: was able to get to chair with O2 sats remaining above 95% and with O2 in place from cannula   Stairs             Wheelchair Mobility    Modified Rankin (Stroke Patients Only)       Balance Overall balance assessment: Needs assistance Sitting-balance support: Feet supported Sitting balance-Leahy Scale: Fair     Standing balance support: Bilateral upper extremity supported;During functional activity Standing balance-Leahy Scale: Poor Standing balance comment: able to support on RW to get to chair                            Cognition Arousal/Alertness: Awake/alert Behavior During Therapy: WFL for tasks assessed/performed Overall Cognitive Status: History of cognitive impairments - at baseline Area of Impairment: Problem solving;Awareness;Safety/judgement;Following commands                 Orientation Level: Situation Current Attention Level: Selective Memory: Decreased short-term memory;Decreased recall of precautions Following Commands: Follows one step commands inconsistently;Follows one step commands with increased time Safety/Judgement: Decreased awareness of safety;Decreased awareness of deficits Awareness: Intellectual Problem Solving: Decreased initiation;Requires verbal cues;Requires tactile cues General Comments: pt is somewhat self limiting and controlling but agreeable to get OOB      Exercises General Exercises -  Lower Extremity Ankle Circles/Pumps: AAROM;Both;5 reps    General Comments General comments (skin integrity, edema, etc.): pt was up in recliner after using RW to sidestep, had legs elevated on individual legs and back of chair angled to support his balance in  sitting.      Pertinent Vitals/Pain Pain Assessment: No/denies pain Faces Pain Scale: No hurt    Home Living                      Prior Function            PT Goals (current goals can now be found in the care plan section) Acute Rehab PT Goals Patient Stated Goal: to go home Progress towards PT goals: Progressing toward goals    Frequency    Min 3X/week      PT Plan Current plan remains appropriate    Co-evaluation   Reason for Co-Treatment: Complexity of the patient's impairments (multi-system involvement);For patient/therapist safety;To address functional/ADL transfers          AM-PAC PT "6 Clicks" Mobility   Outcome Measure  Help needed turning from your back to your side while in a flat bed without using bedrails?: A Lot Help needed moving from lying on your back to sitting on the side of a flat bed without using bedrails?: A Lot Help needed moving to and from a bed to a chair (including a wheelchair)?: A Lot Help needed standing up from a chair using your arms (e.g., wheelchair or bedside chair)?: A Little Help needed to walk in hospital room?: A Little Help needed climbing 3-5 steps with a railing? : A Lot 6 Click Score: 14    End of Session Equipment Utilized During Treatment: Oxygen;Gait belt Activity Tolerance: Patient limited by fatigue Patient left: with call bell/phone within reach;in chair;with nursing/sitter in room Nurse Communication: Mobility status PT Visit Diagnosis: Muscle weakness (generalized) (M62.81);Difficulty in walking, not elsewhere classified (R26.2);Unsteadiness on feet (R26.81)     Time: 1105-1115(+1207 to 1235) PT Time Calculation (min) (ACUTE ONLY): 10 min  Charges:  $Gait Training: 8-22 mins $Therapeutic Exercise: 8-22 mins                     Ramond Dial 05/23/2019, 4:18 PM   Mee Hives, PT MS Acute Rehab Dept. Number: [place]

## 2019-05-23 NOTE — Progress Notes (Signed)
PROGRESS NOTE    [name].  TUU:[id number] DOB: 1960-11-11 DOA: 05/12/2019 PCP: Caren Macadam, MD   Brief Narrative: 58 year old with past medical history significant for obesity, OHS, OSA, chronic respiratory failure with hypoxia and hypercapnia baseline 2 to 3 L of oxygen at home, noncompliant with CPAP, insulin-dependent diabetic, A. fib, CAD, failed CABG 2014, CVA, CKD stage III 4, diastolic heart failure presents with 3 days of worsening shortness of breath found to be hypercapnic and hypoxic requiring endotracheal intubation and PCCM admitted patient.  Chest x-ray was consistent with pulmonary edema, PO2 at 78, pH 7.3  Assessment & Plan:   Active Problems:   Acute on chronic respiratory failure with hypoxia and hypercapnia (HCC)   Goals of care, counseling/discussion   Palliative care encounter   Dysphagia   Acute on chronic hypercapnic and hypoxic respiratory failure secondary to decompensated diastolic heart failure, morbid obesity, OSA/OHS Currently on trilogy, CPAP 2 intubations in a month Continue lower home dose torsemide Allergic to prednisone Continue duo nebs, inhalers, cough suppressants  Hypokalemia Replace prn  History of chronic A. fib  Rate controlled Continue with Eliquis and amiodarone  Chronic kidney disease stage III Stable, Cr baseline range 2.6---2.7 Daily BMP  Diabetes mellitus type 2 SSI, Accu-Cheks, hypoglycemic protocol  Chronic lower extremity wounds Present on admission. Continue with local care  Morbid obesity Advised lifestyle modification  Stage II buttock decubitus 1 pressure injury Present on admission, wound care      Nutrition Problem: Increased nutrient needs Etiology: acute illness    Signs/Symptoms: estimated needs    Interventions: Magic cup, Premier Protein  Estimated body mass index is 50.77 kg/m as calculated from the following:   Height as of this encounter: 5' 11"  (1.803 m).  Weight as of this encounter: 165.1 kg.   DVT prophylaxis: Eliquis Code Status: DNR Family Communication: None at bedside Disposition Plan: Likely SNF Consultants:   CCM  Palliative care  Procedures:   Mechanical ventilation  Antimicrobials:  None  Subjective: Patient seen and examined at bedside.  Met patient yelling on the phone to the nurses about his pain meds.  Denies any other new complaints besides his chronic back pain.  Objective: Vitals:   05/23/19 0647 05/23/19 0849 05/23/19 0853 05/23/19 0934  BP:    (!) 151/79  Pulse:    88  Resp:    17  Temp:    98.3 F (36.8 C)  TempSrc:    Oral  SpO2:  98% 98% 95%  Weight: (!) 165.1 kg     Height:        Intake/Output Summary (Last 24 hours) at 05/23/2019 1457 Last data filed at 05/23/2019 1101 Gross per 24 hour  Intake 1302 ml  Output 2200 ml  Net -898 ml   Filed Weights   05/18/19 0500 05/19/19 0415 05/23/19 0647  Weight: (!) 168.3 kg (!) 166.9 kg (!) 165.1 kg    Examination:  General: NAD, morbid obesity  Cardiovascular: S1, S2 present  Respiratory:  Diminished breath sounds bilaterally  Abdomen: Soft, nontender, nondistended, bowel sounds present  Musculoskeletal: Bilateral LE with Ace wrap  Skin:  As above  Psychiatry: Normal mood   Data Reviewed: I have personally reviewed following labs and imaging studies  CBC: Recent Labs  Lab 05/22/19 0248  WBC 10.1  NEUTROABS 6.3  HGB 11.1*  HCT 37.3*  MCV 99.2  PLT 791   Basic Metabolic Panel: Recent Labs  Lab 05/17/19 0327 05/19/19 1625 05/22/19 0248 05/23/19  0232  NA 144 140 139 140  K 3.5 3.5 3.2* 3.3*  CL 106 98 95* 93*  CO2 29 35* 33* 36*  GLUCOSE 169* 176* 172* 153*  BUN 29* 24* 22* 21*  CREATININE 2.16* 2.18* 2.10* 2.20*  CALCIUM 8.8* 8.6* 8.9 8.8*   GFR: Estimated Creatinine Clearance: 57.6 mL/min (A) (by C-G formula based on SCr of 2.2 mg/dL (H)). Liver Function Tests: No results for input(s): AST, ALT, ALKPHOS,  BILITOT, PROT, ALBUMIN in the last 168 hours. No results for input(s): LIPASE, AMYLASE in the last 168 hours. No results for input(s): AMMONIA in the last 168 hours. Coagulation Profile: No results for input(s): INR, PROTIME in the last 168 hours. Cardiac Enzymes: No results for input(s): CKTOTAL, CKMB, CKMBINDEX, TROPONINI in the last 168 hours. BNP (last 3 results) No results for input(s): PROBNP in the last 8760 hours. HbA1C: No results for input(s): HGBA1C in the last 72 hours. CBG: Recent Labs  Lab 05/22/19 2019 05/22/19 2340 05/23/19 0452 05/23/19 0827 05/23/19 1226  GLUCAP 171* 174* 144* 210* 251*   Lipid Profile: No results for input(s): CHOL, HDL, LDLCALC, TRIG, CHOLHDL, LDLDIRECT in the last 72 hours. Thyroid Function Tests: No results for input(s): TSH, T4TOTAL, FREET4, T3FREE, THYROIDAB in the last 72 hours. Anemia Panel: No results for input(s): VITAMINB12, FOLATE, FERRITIN, TIBC, IRON, RETICCTPCT in the last 72 hours. Sepsis Labs: No results for input(s): PROCALCITON, LATICACIDVEN in the last 168 hours.  No results found for this or any previous visit (from the past 240 hour(s)).       Radiology Studies: No results found.      Scheduled Meds: . amiodarone  100 mg Oral Daily  . apixaban  5 mg Oral BID  . artificial tears   Both Eyes QHS  . budesonide (PULMICORT) nebulizer solution  0.25 mg Nebulization BID  . Chlorhexidine Gluconate Cloth  6 each Topical Daily  . dextromethorphan-guaiFENesin  2 tablet Oral BID  . hydroxypropyl methylcellulose / hypromellose  1 drop Both Eyes TID  . insulin aspart  0-15 Units Subcutaneous Q4H  . ipratropium  0.5 mg Nebulization BID  . levalbuterol  0.63 mg Nebulization BID  . mouth rinse  15 mL Mouth Rinse BID  . pantoprazole  40 mg Oral QHS  . Ensure Max Protein  11 oz Oral Daily  . rosuvastatin  20 mg Oral q1800  . senna  1 tablet Oral QHS  . sodium chloride flush  10-40 mL Intracatheter Q12H  . topiramate   100 mg Oral QHS  . torsemide  80 mg Oral Daily   Continuous Infusions:    LOS: 11 days       Alma Friendly, MD [facility]   If 7PM-7AM, please contact night-coverage [email or web address] 05/23/2019, 2:57 PM

## 2019-05-23 NOTE — Progress Notes (Signed)
NCM had conducted SNF search. However, surrounding SNF's are unable to accept pt on TRILOGY. NCM also reached out to vent SNF's who are also unable to accept pt on Trilogy and states pt would have to be on their full vent machine. TOC team to f/u on options. Whitman Hero RN,BSN,CM [phone]

## 2019-05-23 NOTE — Plan of Care (Signed)
  Problem: Clinical Measurements: Goal: Will remain free from infection Outcome: Progressing   Problem: Clinical Measurements: Goal: Respiratory complications will improve Outcome: Progressing   Problem: Clinical Measurements: Goal: Cardiovascular complication will be avoided Outcome: Progressing   Problem: Coping: Goal: Level of anxiety will decrease Outcome: Progressing   Problem: Safety: Goal: Ability to remain free from injury will improve Outcome: Progressing

## 2019-05-24 LAB — BASIC METABOLIC PANEL
Anion gap: 11 (ref 5–15)
BUN: 20 mg/dL (ref 6–20)
CO2: 31 mmol/L (ref 22–32)
Calcium: 9.1 mg/dL (ref 8.9–10.3)
Chloride: 97 mmol/L — ABNORMAL LOW (ref 98–111)
Creatinine, Ser: 2.2 mg/dL — ABNORMAL HIGH (ref 0.61–1.24)
GFR calc Af Amer: 37 mL/min — ABNORMAL LOW (ref >60)
GFR calc non Af Amer: 32 mL/min — ABNORMAL LOW (ref >60)
Glucose, Bld: 162 mg/dL — ABNORMAL HIGH (ref 70–99)
Potassium: 3.6 mmol/L (ref 3.5–5.1)
Sodium: 139 mmol/L (ref 135–145)

## 2019-05-24 LAB — GLUCOSE, CAPILLARY
Glucose-Capillary: 152 mg/dL — ABNORMAL HIGH (ref 70–99)
Glucose-Capillary: 157 mg/dL — ABNORMAL HIGH (ref 70–99)
Glucose-Capillary: 160 mg/dL — ABNORMAL HIGH (ref 70–99)
Glucose-Capillary: 177 mg/dL — ABNORMAL HIGH (ref 70–99)
Glucose-Capillary: 196 mg/dL — ABNORMAL HIGH (ref 70–99)
Glucose-Capillary: 211 mg/dL — ABNORMAL HIGH (ref 70–99)
Glucose-Capillary: 255 mg/dL — ABNORMAL HIGH (ref 70–99)

## 2019-05-24 NOTE — Progress Notes (Signed)
Patient places self on and off of CPAP with no assistance needed

## 2019-05-24 NOTE — Progress Notes (Signed)
Patient takes himself on and off his home unit CPAP no assistance is needed.

## 2019-05-24 NOTE — Progress Notes (Signed)
PROGRESS NOTE    [name].  OJJ:[id number] DOB: [birth year] DOA: 05/12/2019 PCP: Caren Macadam, MD   Brief Narrative: 58 year old with past medical history significant for obesity, OHS, OSA, chronic respiratory failure with hypoxia and hypercapnia baseline 2 to 3 L of oxygen at home, noncompliant with CPAP, insulin-dependent diabetic, A. fib, CAD, failed CABG 2014, CVA, CKD stage III 4, diastolic heart failure presents with 3 days of worsening shortness of breath found to be hypercapnic and hypoxic requiring endotracheal intubation and PCCM admitted patient.  Chest x-ray was consistent with pulmonary edema, PO2 at 78, pH 7.3  Assessment & Plan:   Active Problems:   Acute on chronic respiratory failure with hypoxia and hypercapnia (HCC)   Goals of care, counseling/discussion   Palliative care encounter   Dysphagia   Acute on chronic hypercapnic and hypoxic respiratory failure secondary to decompensated diastolic heart failure, morbid obesity, OSA/OHS Currently on trilogy, CPAP 2 intubations in a month Continue lower home dose torsemide Allergic to prednisone Continue duo nebs, inhalers, cough suppressants  Hypokalemia Replace prn  History of chronic A. fib  Rate controlled Continue with Eliquis and amiodarone  Chronic kidney disease stage III Stable, Cr baseline range 2.6---2.7 BMP  Diabetes mellitus type 2 SSI, Accu-Cheks, hypoglycemic protocol  Chronic lower extremity wounds Present on admission. Continue with local care  Morbid obesity Advised lifestyle modification  Stage II buttock decubitus 1 pressure injury Present on admission, wound care      Nutrition Problem: Increased nutrient needs Etiology: acute illness    Signs/Symptoms: estimated needs    Interventions: Magic cup, Premier Protein  Estimated body mass index is 50.77 kg/m as calculated from the following:   Height as of this encounter: 5\' 11"  (1.803 m).   Weight as  of this encounter: 165.1 kg.   DVT prophylaxis: Eliquis Code Status: DNR Family Communication: None at bedside Disposition Plan: Likely SNF Consultants:   CCM  Palliative care  Procedures:   Mechanical ventilation  Antimicrobials:  None  Subjective: Patient denies any new complaints today, reports feeling better overall.  Objective: Vitals:   05/23/19 2301 05/24/19 0534 05/24/19 0839 05/24/19 1256  BP:  137/63  140/89  Pulse:  61  77  Resp:  19  16  Temp:  97.9 F (36.6 C)  98.3 F (36.8 C)  TempSrc:  Oral  Oral  SpO2: 96% 97% 94% 97%  Weight:  (!) 165.1 kg    Height:        Intake/Output Summary (Last 24 hours) at 05/24/2019 1309 Last data filed at 05/24/2019 0500 Gross per 24 hour  Intake 480 ml  Output 1200 ml  Net -720 ml   Filed Weights   05/19/19 0415 05/23/19 0647 05/24/19 0534  Weight: (!) 166.9 kg (!) 165.1 kg (!) 165.1 kg    Examination:  General: NAD, morbid obesity  Cardiovascular: S1, S2 present  Respiratory:  Diminished breath sounds bilaterally  Abdomen: Soft, nontender, nondistended, bowel sounds present  Musculoskeletal: Bilateral LE with Ace wrap  Skin:  As above  Psychiatry: Normal mood   Data Reviewed: I have personally reviewed following labs and imaging studies  CBC: Recent Labs  Lab 05/22/19 0248  WBC 10.1  NEUTROABS 6.3  HGB 11.1*  HCT 37.3*  MCV 99.2  PLT 937   Basic Metabolic Panel: Recent Labs  Lab 05/19/19 1625 05/22/19 0248 05/23/19 0232 05/24/19 0823  NA 140 139 140 139  K 3.5 3.2* 3.3* 3.6  CL 98 95* 93*  97*  CO2 35* 33* 36* 31  GLUCOSE 176* 172* 153* 162*  BUN 24* 22* 21* 20  CREATININE 2.18* 2.10* 2.20* 2.20*  CALCIUM 8.6* 8.9 8.8* 9.1   GFR: Estimated Creatinine Clearance: 57.6 mL/min (A) (by C-G formula based on SCr of 2.2 mg/dL (H)). Liver Function Tests: No results for input(s): AST, ALT, ALKPHOS, BILITOT, PROT, ALBUMIN in the last 168 hours. No results for input(s): LIPASE, AMYLASE  in the last 168 hours. No results for input(s): AMMONIA in the last 168 hours. Coagulation Profile: No results for input(s): INR, PROTIME in the last 168 hours. Cardiac Enzymes: No results for input(s): CKTOTAL, CKMB, CKMBINDEX, TROPONINI in the last 168 hours. BNP (last 3 results) No results for input(s): PROBNP in the last 8760 hours. HbA1C: No results for input(s): HGBA1C in the last 72 hours. CBG: Recent Labs  Lab 05/23/19 2032 05/24/19 0015 05/24/19 0531 05/24/19 0730 05/24/19 1300  GLUCAP 197* 196* 152* 177* 255*   Lipid Profile: No results for input(s): CHOL, HDL, LDLCALC, TRIG, CHOLHDL, LDLDIRECT in the last 72 hours. Thyroid Function Tests: No results for input(s): TSH, T4TOTAL, FREET4, T3FREE, THYROIDAB in the last 72 hours. Anemia Panel: No results for input(s): VITAMINB12, FOLATE, FERRITIN, TIBC, IRON, RETICCTPCT in the last 72 hours. Sepsis Labs: No results for input(s): PROCALCITON, LATICACIDVEN in the last 168 hours.  No results found for this or any previous visit (from the past 240 hour(s)).       Radiology Studies: No results found.      Scheduled Meds: . amiodarone  100 mg Oral Daily  . apixaban  5 mg Oral BID  . artificial tears   Both Eyes QHS  . budesonide (PULMICORT) nebulizer solution  0.25 mg Nebulization BID  . Chlorhexidine Gluconate Cloth  6 each Topical Daily  . dextromethorphan-guaiFENesin  2 tablet Oral BID  . hydroxypropyl methylcellulose / hypromellose  1 drop Both Eyes TID  . insulin aspart  0-15 Units Subcutaneous Q4H  . ipratropium  0.5 mg Nebulization BID  . levalbuterol  0.63 mg Nebulization BID  . mouth rinse  15 mL Mouth Rinse BID  . pantoprazole  40 mg Oral QHS  . potassium chloride  40 mEq Oral Daily  . Ensure Max Protein  11 oz Oral Daily  . rosuvastatin  20 mg Oral q1800  . senna  1 tablet Oral QHS  . sodium chloride flush  10-40 mL Intracatheter Q12H  . topiramate  100 mg Oral QHS  . torsemide  80 mg Oral Daily    Continuous Infusions:    LOS: 12 days       Alma Friendly, MD [facility]   If 7PM-7AM, please contact night-coverage [email or web address] 05/24/2019, 1:09 PM

## 2019-05-25 LAB — GLUCOSE, CAPILLARY
Glucose-Capillary: 163 mg/dL — ABNORMAL HIGH (ref 70–99)
Glucose-Capillary: 190 mg/dL — ABNORMAL HIGH (ref 70–99)
Glucose-Capillary: 246 mg/dL — ABNORMAL HIGH (ref 70–99)
Glucose-Capillary: 173 mg/dL — ABNORMAL HIGH (ref 70–99)
Glucose-Capillary: 221 mg/dL — ABNORMAL HIGH (ref 70–99)

## 2019-05-25 MED ORDER — ISOSORBIDE MONONITRATE ER 30 MG PO TB24
30.0000 mg | ORAL_TABLET | Freq: Every day | ORAL | Status: DC
  Administered 2019-05-25 – 2019-06-04 (×11): 30 mg via ORAL
  Filled 2019-05-25 (×11): qty 1

## 2019-05-25 NOTE — Progress Notes (Signed)
PROGRESS NOTE    [name].  GYI:[id number] DOB: September 13, 1960 DOA: 05/12/2019 PCP: Caren Macadam, MD   Brief Narrative: 58 year old with past medical history significant for obesity, OHS, OSA, chronic respiratory failure with hypoxia and hypercapnia baseline 2 to 3 L of oxygen at home, noncompliant with CPAP, insulin-dependent diabetic, A. fib, CAD, failed CABG 2014, CVA, CKD stage III 4, diastolic heart failure presents with 3 days of worsening shortness of breath found to be hypercapnic and hypoxic requiring endotracheal intubation and PCCM admitted patient.  Chest x-ray was consistent with pulmonary edema, PO2 at 78, pH 7.3  Assessment & Plan:   Active Problems:   Acute on chronic respiratory failure with hypoxia and hypercapnia (HCC)   Goals of care, counseling/discussion   Palliative care encounter   Dysphagia   Acute on chronic hypercapnic and hypoxic respiratory failure secondary to decompensated diastolic heart failure, morbid obesity, OSA/OHS Currently on trilogy, CPAP 2 intubations in a month Continue lower home dose torsemide Allergic to prednisone Continue duo nebs, inhalers, cough suppressants  Hypokalemia Replace prn  History of chronic A. fib  Rate controlled Continue with Eliquis and amiodarone  Chronic kidney disease stage III Stable, Cr baseline range 2.6---2.7 BMP  Diabetes mellitus type 2 SSI, Accu-Cheks, hypoglycemic protocol  Chronic lower extremity wounds Present on admission. Continue with local care  Morbid obesity Advised lifestyle modification  Stage II buttock decubitus 1 pressure injury Present on admission, wound care      Nutrition Problem: Increased nutrient needs Etiology: acute illness    Signs/Symptoms: estimated needs    Interventions: Magic cup, Premier Protein  Estimated body mass index is 50.21 kg/m as calculated from the following:   Height as of this encounter: 5\' 11"  (1.803 m).   Weight as  of this encounter: 163.3 kg.   DVT prophylaxis: Eliquis Code Status: DNR Family Communication: None at bedside Disposition Plan: Likely SNF Consultants:   CCM  Palliative care  Procedures:   Mechanical ventilation  Antimicrobials:  None  Subjective: Patient seen and examined at bedside, reports improving, wants to sit up in chair today.  Objective: Vitals:   05/24/19 2037 05/24/19 2251 05/25/19 0504 05/25/19 0722  BP:  130/69 (!) 152/86   Pulse:  93 81   Resp:  16 18   Temp:  98.3 F (36.8 C) 98.4 F (36.9 C)   TempSrc:  Oral    SpO2: 98% 95% 96% 93%  Weight:   (!) 163.3 kg   Height:        Intake/Output Summary (Last 24 hours) at 05/25/2019 1456 Last data filed at 05/25/2019 0507 Gross per 24 hour  Intake 240 ml  Output 2950 ml  Net -2710 ml   Filed Weights   05/23/19 0647 05/24/19 0534 05/25/19 0504  Weight: (!) 165.1 kg (!) 165.1 kg (!) 163.3 kg    Examination:  General: NAD, morbid obesity  Cardiovascular: S1, S2 present  Respiratory:  Diminished breath sounds bilaterally  Abdomen: Soft, nontender, nondistended, bowel sounds present  Musculoskeletal: Bilateral LE with Ace wrap  Skin:  As above  Psychiatry: Normal mood   Data Reviewed: I have personally reviewed following labs and imaging studies  CBC: Recent Labs  Lab 05/22/19 0248  WBC 10.1  NEUTROABS 6.3  HGB 11.1*  HCT 37.3*  MCV 99.2  PLT 350   Basic Metabolic Panel: Recent Labs  Lab 05/19/19 1625 05/22/19 0248 05/23/19 0232 05/24/19 0823  NA 140 139 140 139  K 3.5 3.2* 3.3*  3.6  CL 98 95* 93* 97*  CO2 35* 33* 36* 31  GLUCOSE 176* 172* 153* 162*  BUN 24* 22* 21* 20  CREATININE 2.18* 2.10* 2.20* 2.20*  CALCIUM 8.6* 8.9 8.8* 9.1   GFR: Estimated Creatinine Clearance: 57.2 mL/min (A) (by C-G formula based on SCr of 2.2 mg/dL (H)). Liver Function Tests: No results for input(s): AST, ALT, ALKPHOS, BILITOT, PROT, ALBUMIN in the last 168 hours. No results for input(s):  LIPASE, AMYLASE in the last 168 hours. No results for input(s): AMMONIA in the last 168 hours. Coagulation Profile: No results for input(s): INR, PROTIME in the last 168 hours. Cardiac Enzymes: No results for input(s): CKTOTAL, CKMB, CKMBINDEX, TROPONINI in the last 168 hours. BNP (last 3 results) No results for input(s): PROBNP in the last 8760 hours. HbA1C: No results for input(s): HGBA1C in the last 72 hours. CBG: Recent Labs  Lab 05/24/19 2154 05/24/19 2337 05/25/19 0447 05/25/19 0743 05/25/19 1213  GLUCAP 160* 157* 163* 190* 246*   Lipid Profile: No results for input(s): CHOL, HDL, LDLCALC, TRIG, CHOLHDL, LDLDIRECT in the last 72 hours. Thyroid Function Tests: No results for input(s): TSH, T4TOTAL, FREET4, T3FREE, THYROIDAB in the last 72 hours. Anemia Panel: No results for input(s): VITAMINB12, FOLATE, FERRITIN, TIBC, IRON, RETICCTPCT in the last 72 hours. Sepsis Labs: No results for input(s): PROCALCITON, LATICACIDVEN in the last 168 hours.  No results found for this or any previous visit (from the past 240 hour(s)).       Radiology Studies: No results found.      Scheduled Meds: . amiodarone  100 mg Oral Daily  . apixaban  5 mg Oral BID  . artificial tears   Both Eyes QHS  . budesonide (PULMICORT) nebulizer solution  0.25 mg Nebulization BID  . Chlorhexidine Gluconate Cloth  6 each Topical Daily  . dextromethorphan-guaiFENesin  2 tablet Oral BID  . hydroxypropyl methylcellulose / hypromellose  1 drop Both Eyes TID  . insulin aspart  0-15 Units Subcutaneous Q4H  . ipratropium  0.5 mg Nebulization BID  . isosorbide mononitrate  30 mg Oral QHS  . levalbuterol  0.63 mg Nebulization BID  . mouth rinse  15 mL Mouth Rinse BID  . pantoprazole  40 mg Oral QHS  . potassium chloride  40 mEq Oral Daily  . Ensure Max Protein  11 oz Oral Daily  . rosuvastatin  20 mg Oral q1800  . senna  1 tablet Oral QHS  . sodium chloride flush  10-40 mL Intracatheter Q12H  .  topiramate  100 mg Oral QHS  . torsemide  80 mg Oral Daily   Continuous Infusions:    LOS: 13 days       Alma Friendly, MD [facility]   If 7PM-7AM, please contact night-coverage [email or web address] 05/25/2019, 2:56 PM

## 2019-05-26 LAB — GLUCOSE, CAPILLARY
Glucose-Capillary: 155 mg/dL — ABNORMAL HIGH (ref 70–99)
Glucose-Capillary: 185 mg/dL — ABNORMAL HIGH (ref 70–99)
Glucose-Capillary: 197 mg/dL — ABNORMAL HIGH (ref 70–99)
Glucose-Capillary: 234 mg/dL — ABNORMAL HIGH (ref 70–99)
Glucose-Capillary: 236 mg/dL — ABNORMAL HIGH (ref 70–99)
Glucose-Capillary: 278 mg/dL — ABNORMAL HIGH (ref 70–99)

## 2019-05-26 NOTE — Progress Notes (Signed)
PROGRESS NOTE    [name].  HER:[id number] DOB: [birth year] DOA: 05/12/2019 PCP: Caren Macadam, MD   Brief Narrative: 58 year old with past medical history significant for obesity, OHS, OSA, chronic respiratory failure with hypoxia and hypercapnia baseline 2 to 3 L of oxygen at home, noncompliant with CPAP, insulin-dependent diabetic, A. fib, CAD, failed CABG 2014, CVA, CKD stage III 4, diastolic heart failure presents with 3 days of worsening shortness of breath found to be hypercapnic and hypoxic requiring endotracheal intubation and PCCM admitted patient.  Chest x-ray was consistent with pulmonary edema, PO2 at 78, pH 7.3  Assessment & Plan:   Active Problems:   Acute on chronic respiratory failure with hypoxia and hypercapnia (HCC)   Goals of care, counseling/discussion   Palliative care encounter   Dysphagia   Acute on chronic hypercapnic and hypoxic respiratory failure secondary to decompensated diastolic heart failure, morbid obesity, OSA/OHS Currently on trilogy, CPAP 2 intubations in a month Continue lower home dose torsemide Allergic to prednisone Continue duo nebs, inhalers, cough suppressants  Hypokalemia Replace prn  History of chronic A. fib  Rate controlled Continue with Eliquis and amiodarone  Chronic kidney disease stage III Stable, Cr baseline range 2.6---2.7 BMP  Diabetes mellitus type 2 SSI, Accu-Cheks, hypoglycemic protocol  Chronic lower extremity wounds Present on admission. Continue with local care  Morbid obesity Advised lifestyle modification  Stage II buttock decubitus 1 pressure injury Present on admission, wound care      Nutrition Problem: Increased nutrient needs Etiology: acute illness    Signs/Symptoms: estimated needs    Interventions: Magic cup, Premier Protein  Estimated body mass index is 50.91 kg/m as calculated from the following:   Height as of this encounter: 5\' 11"  (1.803 m).   Weight as  of this encounter: 165.6 kg.   DVT prophylaxis: Eliquis Code Status: DNR Family Communication: Attempted to speak to wife on 05/25/19, called, no answer Disposition Plan: Likely SNF Consultants:   CCM  Palliative care  Procedures:   Mechanical ventilation  Antimicrobials:  None  Subjective: Patient seen and examined at bedside, reports feeling much better.  Eager to sit on a chair.  Denies any chest pain, worsening shortness of breath, abdominal pain, fever/chills.  Objective: Vitals:   05/26/19 0526 05/26/19 0816 05/26/19 0844 05/26/19 1154  BP: 124/74 140/62  114/81  Pulse: 94 92  82  Resp: 16 16  18   Temp: 97.9 F (36.6 C) 98.3 F (36.8 C)  97.7 F (36.5 C)  TempSrc: Oral Oral  Oral  SpO2: 92% 90% 91% 99%  Weight:      Height:        Intake/Output Summary (Last 24 hours) at 05/26/2019 1325 Last data filed at 05/26/2019 1031 Gross per 24 hour  Intake 360 ml  Output 4100 ml  Net -3740 ml   Filed Weights   05/24/19 0534 05/25/19 0504 05/26/19 0500  Weight: (!) 165.1 kg (!) 163.3 kg (!) 165.6 kg    Examination:  General: NAD, morbid obseity  Cardiovascular: S1, S2 present  Respiratory:  Diminished breath sounds bilaterally  Abdomen: Soft, nontender, nondistended, bowel sounds present  Musculoskeletal: Bilateral LE with Ace wrap  Skin:  As above  Psychiatry: Normal mood    Data Reviewed: I have personally reviewed following labs and imaging studies  CBC: Recent Labs  Lab 05/22/19 0248  WBC 10.1  NEUTROABS 6.3  HGB 11.1*  HCT 37.3*  MCV 99.2  PLT 856   Basic Metabolic Panel: Recent  Labs  Lab 05/19/19 1625 05/22/19 0248 05/23/19 0232 05/24/19 0823  NA 140 139 140 139  K 3.5 3.2* 3.3* 3.6  CL 98 95* 93* 97*  CO2 35* 33* 36* 31  GLUCOSE 176* 172* 153* 162*  BUN 24* 22* 21* 20  CREATININE 2.18* 2.10* 2.20* 2.20*  CALCIUM 8.6* 8.9 8.8* 9.1   GFR: Estimated Creatinine Clearance: 57.7 mL/min (A) (by C-G formula based on SCr of 2.2  mg/dL (H)). Liver Function Tests: No results for input(s): AST, ALT, ALKPHOS, BILITOT, PROT, ALBUMIN in the last 168 hours. No results for input(s): LIPASE, AMYLASE in the last 168 hours. No results for input(s): AMMONIA in the last 168 hours. Coagulation Profile: No results for input(s): INR, PROTIME in the last 168 hours. Cardiac Enzymes: No results for input(s): CKTOTAL, CKMB, CKMBINDEX, TROPONINI in the last 168 hours. BNP (last 3 results) No results for input(s): PROBNP in the last 8760 hours. HbA1C: No results for input(s): HGBA1C in the last 72 hours. CBG: Recent Labs  Lab 05/25/19 2105 05/26/19 0001 05/26/19 0525 05/26/19 0816 05/26/19 1154  GLUCAP 221* 278* 185* 236* 234*   Lipid Profile: No results for input(s): CHOL, HDL, LDLCALC, TRIG, CHOLHDL, LDLDIRECT in the last 72 hours. Thyroid Function Tests: No results for input(s): TSH, T4TOTAL, FREET4, T3FREE, THYROIDAB in the last 72 hours. Anemia Panel: No results for input(s): VITAMINB12, FOLATE, FERRITIN, TIBC, IRON, RETICCTPCT in the last 72 hours. Sepsis Labs: No results for input(s): PROCALCITON, LATICACIDVEN in the last 168 hours.  No results found for this or any previous visit (from the past 240 hour(s)).       Radiology Studies: No results found.      Scheduled Meds: . amiodarone  100 mg Oral Daily  . apixaban  5 mg Oral BID  . artificial tears   Both Eyes QHS  . budesonide (PULMICORT) nebulizer solution  0.25 mg Nebulization BID  . Chlorhexidine Gluconate Cloth  6 each Topical Daily  . dextromethorphan-guaiFENesin  2 tablet Oral BID  . hydroxypropyl methylcellulose / hypromellose  1 drop Both Eyes TID  . insulin aspart  0-15 Units Subcutaneous Q4H  . ipratropium  0.5 mg Nebulization BID  . isosorbide mononitrate  30 mg Oral QHS  . levalbuterol  0.63 mg Nebulization BID  . mouth rinse  15 mL Mouth Rinse BID  . pantoprazole  40 mg Oral QHS  . potassium chloride  40 mEq Oral Daily  . Ensure  Max Protein  11 oz Oral Daily  . rosuvastatin  20 mg Oral q1800  . senna  1 tablet Oral QHS  . sodium chloride flush  10-40 mL Intracatheter Q12H  . topiramate  100 mg Oral QHS  . torsemide  80 mg Oral Daily   Continuous Infusions:    LOS: 14 days       Alma Friendly, MD [facility]   If 7PM-7AM, please contact night-coverage [email or web address] 05/26/2019, 1:25 PM

## 2019-05-27 ENCOUNTER — Other Ambulatory Visit: Payer: Self-pay

## 2019-05-27 LAB — CBC WITH DIFFERENTIAL/PLATELET
Abs Immature Granulocytes: 0.07 10*3/uL (ref 0.00–0.07)
Basophils Absolute: 0.1 10*3/uL (ref 0.0–0.1)
Basophils Relative: 1 %
Eosinophils Absolute: 0.7 10*3/uL — ABNORMAL HIGH (ref 0.0–0.5)
Eosinophils Relative: 7 %
HCT: 37 % — ABNORMAL LOW (ref 39.0–52.0)
Hemoglobin: 10.9 g/dL — ABNORMAL LOW (ref 13.0–17.0)
Immature Granulocytes: 1 %
Lymphocytes Relative: 20 %
Lymphs Abs: 2 10*3/uL (ref 0.7–4.0)
MCH: 29.2 pg (ref 26.0–34.0)
MCHC: 29.5 g/dL — ABNORMAL LOW (ref 30.0–36.0)
MCV: 99.2 fL (ref 80.0–100.0)
Monocytes Absolute: 1 10*3/uL (ref 0.1–1.0)
Monocytes Relative: 9 %
Neutro Abs: 6.4 10*3/uL (ref 1.7–7.7)
Neutrophils Relative %: 62 %
Platelets: 275 10*3/uL (ref 150–400)
RBC: 3.73 MIL/uL — ABNORMAL LOW (ref 4.22–5.81)
RDW: 13.8 % (ref 11.5–15.5)
WBC: 10.2 10*3/uL (ref 4.0–10.5)
nRBC: 0 % (ref 0.0–0.2)

## 2019-05-27 LAB — BASIC METABOLIC PANEL
Anion gap: 12 (ref 5–15)
BUN: 22 mg/dL — ABNORMAL HIGH (ref 6–20)
CO2: 32 mmol/L (ref 22–32)
Calcium: 8.8 mg/dL — ABNORMAL LOW (ref 8.9–10.3)
Chloride: 97 mmol/L — ABNORMAL LOW (ref 98–111)
Creatinine, Ser: 2.19 mg/dL — ABNORMAL HIGH (ref 0.61–1.24)
GFR calc Af Amer: 37 mL/min — ABNORMAL LOW (ref >60)
GFR calc non Af Amer: 32 mL/min — ABNORMAL LOW (ref >60)
Glucose, Bld: 199 mg/dL — ABNORMAL HIGH (ref 70–99)
Potassium: 3.5 mmol/L (ref 3.5–5.1)
Sodium: 141 mmol/L (ref 135–145)

## 2019-05-27 LAB — GLUCOSE, CAPILLARY
Glucose-Capillary: 179 mg/dL — ABNORMAL HIGH (ref 70–99)
Glucose-Capillary: 180 mg/dL — ABNORMAL HIGH (ref 70–99)
Glucose-Capillary: 199 mg/dL — ABNORMAL HIGH (ref 70–99)
Glucose-Capillary: 258 mg/dL — ABNORMAL HIGH (ref 70–99)
Glucose-Capillary: 297 mg/dL — ABNORMAL HIGH (ref 70–99)
Glucose-Capillary: 341 mg/dL — ABNORMAL HIGH (ref 70–99)

## 2019-05-27 MED ORDER — TORSEMIDE 20 MG PO TABS
80.0000 mg | ORAL_TABLET | Freq: Two times a day (BID) | ORAL | Status: DC
  Administered 2019-05-27 – 2019-06-05 (×18): 80 mg via ORAL
  Filled 2019-05-27 (×18): qty 4

## 2019-05-27 NOTE — Progress Notes (Addendum)
NCM made attempt to further discuss d/c plan with wife via phone call. Call unsuccessful, wife @ work, unable to leave voice message.Marland Kitchen No SNF bed offers 2/2 trilogy. PTA pt active with [facility], NCM called to speak with liaison to ? help collaborate a plan when pt is medically ready for transition to ? home, voice message left awaiting call back. Referral made with [facility] for community support ...liaison to f/u with NCM after discussing case with SW. NCM will continue to monitor for TOC needs ... Whitman Hero RN, BSN,CM

## 2019-05-27 NOTE — Patient Outreach (Signed)
  [facility]) Care Management Chronic Special Needs Program   05/27/2019  Name: [name]., DOB: December 09, 1960  MRN: [id number]  The client was discussed in an nterdisciplinary care team meeting.  The following issues were discussed:  Client's needs, Changes in health status, Care Plan, Coordination of care, Care transitions and Issues/barriers to care  Participants present:  Eritrea Brewer(hospital liaison), RNCM; Bary Castilla, RNCM; Thea Silversmith, [facility]  Plan: Hospital liaison to continue care coordination with inpatient case management re: discharge needs. RNCM will continue to follow.  Thea Silversmith, RN, MSN, [place] [phone]

## 2019-05-27 NOTE — Consult Note (Signed)
   [facility] CM Inpatient Consult   05/27/2019  Caton Popowski. 1961/01/26 [id number]   THN status:  HTA CSNP active, long LLOS  Call received from inpatient [facility] RNCM regard HTA CSNP support for post hospital transition of care needs. Patient discussed with inpatient [facility] RNCM regarding barriers for transitioning from the hospital and ongoing care needs. Patient has a TRILOGY and has been declined by SNFs and [facility] per Levada Dy. Spoke with [place] and collaborated with [place] Surveyor, quantity regarding barriers to post hospital care needs.  Follow up regarding area SNFs and out reaches to [facility] and [facility] hospitals have declined patient due to TRILOGY per inpatient TOC.  Return call and follow up with inpatient TOC to confirm outreaches and patient's need for 24 hour care in which without it is potentially an unsafe transition.  Continue to follow up with inpatient [facility] team and HTA RNCM.  For questions and concerns please contact:  Natividad Brood, RN BSN [place] [facility] Liaison  [phone] business mobile phone Toll free office [phone]  Fax number: [phone] [email or web address]

## 2019-05-27 NOTE — Progress Notes (Signed)
Physical Therapy Treatment Patient Details Name: [name]. MRN: [id number] DOB: [birth year] Today's Date: 05/27/2019    History of Present Illness Pt is a 58 yo male admitted with worsening SOB, found to hypercarbic and hypoxic. ETT 9/21/9/23. PMH OHS, OSA, chronic respiratory failure with hypoxia and hypercapnia, on baseline 2 to 3 L/min home oxygen, noncompliant with CPAP IDDM, Afib, CAD, failed CABG in 2014, CVA, CKD stage 3-4, and diastolic HF    PT Comments    Pt was able to be assisted to side of bed then standing with min assist, but is slow to respond to cues for safety before standing.  Tends to get to side of bed and immediately stands up.  He is still appropriate for SNF care due to his limitations of endurance from breathing, for his balance and control of walker as well as independence in getting to the chair.  Pt is mainly a safety concern, and secondarily a risk for desaturation during mobility.  Follow acutely for POC as outlined.   Follow Up Recommendations  SNF;Supervision/Assistance - 24 hour     Equipment Recommendations  None recommended by PT    Recommendations for Other Services       Precautions / Restrictions Precautions Precautions: Fall Precaution Comments: R eye blind, Monitor O2 sats Restrictions Weight Bearing Restrictions: No Other Position/Activity Restrictions: on O2 via cannula    Mobility  Bed Mobility Overal bed mobility: Needs Assistance Bed Mobility: Supine to Sit Rolling: Min guard;Min assist   Supine to sit: Min assist        Transfers Overall transfer level: Needs assistance Equipment used: Rolling walker (2 wheeled);1 person hand held assist Transfers: Sit to/from Stand Sit to Stand: Min guard Stand pivot transfers: Min guard       General transfer comment: pt was up with min guard to stand once at EOB  Ambulation/Gait Ambulation/Gait assistance: Min guard Gait Distance (Feet): 5 Feet Assistive device:  Rolling walker (2 wheeled);1 person hand held assist Gait Pattern/deviations: Step-to pattern;Step-through pattern;Decreased stride length;Wide base of support Gait velocity: decreased Gait velocity interpretation: <1.31 ft/sec, indicative of household ambulator General Gait Details: up to chair with O2 sats under control and above 90% with cannula in place   Stairs             Wheelchair Mobility    Modified Rankin (Stroke Patients Only)       Balance Overall balance assessment: Needs assistance Sitting-balance support: Feet supported Sitting balance-Leahy Scale: Fair     Standing balance support: Bilateral upper extremity supported;During functional activity Standing balance-Leahy Scale: Fair Standing balance comment: able to use walker just for mobility to chair                            Cognition Arousal/Alertness: Awake/alert Behavior During Therapy: WFL for tasks assessed/performed Overall Cognitive Status: History of cognitive impairments - at baseline Area of Impairment: Safety/judgement;Awareness;Problem solving;Attention                   Current Attention Level: Selective Memory: Decreased short-term memory Following Commands: Follows one step commands inconsistently;Follows one step commands with increased time Safety/Judgement: Decreased awareness of safety;Decreased awareness of deficits Awareness: Intellectual Problem Solving: Slow processing;Requires verbal cues General Comments: pt was more motivated to get up today but is anxious about making use of available help for needs      Exercises General Exercises - Lower Extremity Ankle Circles/Pumps: AROM;Both;5 reps  Long Arc Quad: Strengthening;Both;10 reps Heel Slides: Strengthening;Both;10 reps Hip ABduction/ADduction: AROM;Both;10 reps    General Comments General comments (skin integrity, edema, etc.): pt is more mobile today, but is quite anxious about having available  person help him to get his needs met.  Reassured him PT would take care of all requests, and finally was able to be up in chair without issues      Pertinent Vitals/Pain Pain Assessment: No/denies pain    Home Living                      Prior Function            PT Goals (current goals can now be found in the care plan section) Acute Rehab PT Goals Patient Stated Goal: get stronger and get home Progress towards PT goals: Progressing toward goals    Frequency    Min 3X/week      PT Plan Current plan remains appropriate    Co-evaluation              AM-PAC PT "6 Clicks" Mobility   Outcome Measure  Help needed turning from your back to your side while in a flat bed without using bedrails?: A Little Help needed moving from lying on your back to sitting on the side of a flat bed without using bedrails?: A Little Help needed moving to and from a bed to a chair (including a wheelchair)?: A Little Help needed standing up from a chair using your arms (e.g., wheelchair or bedside chair)?: A Little Help needed to walk in hospital room?: A Little Help needed climbing 3-5 steps with a railing? : Total 6 Click Score: 16    End of Session Equipment Utilized During Treatment: Oxygen Activity Tolerance: Patient limited by fatigue Patient left: in chair;with call bell/phone within reach Nurse Communication: Mobility status PT Visit Diagnosis: Muscle weakness (generalized) (M62.81);Difficulty in walking, not elsewhere classified (R26.2);Unsteadiness on feet (R26.81)     Time: 1021-1173 PT Time Calculation (min) (ACUTE ONLY): 31 min  Charges:  $Gait Training: 8-22 mins $Therapeutic Exercise: 8-22 mins                     Ramond Dial 05/27/2019, 12:38 PM   Mee Hives, PT MS Acute Rehab Dept. Number: [place] and [place]

## 2019-05-27 NOTE — Progress Notes (Signed)
CSW assisting RNCM to find SNF options. CSW spoke with [facility] who reports they are unable to accommodate [employer] machines. CSW also spoke with [facility] in [place]. They are sometimes able to handle [employer] machines with CPAP/Bipap settings but they are not in network with [facility]. Patient does not have Medicaid therefore we are unable to look at SNFs outside of [place].   Percell Locus Jamaal Bernasconi LCSW [phone]

## 2019-05-27 NOTE — Progress Notes (Signed)
PROGRESS NOTE    [name].  ION:[id number] DOB: 04-12-1961 DOA: 05/12/2019 PCP: Caren Macadam, MD   Brief Narrative: 58 year old with past medical history significant for obesity, OHS, OSA, chronic respiratory failure with hypoxia and hypercapnia baseline 2 to 3 L of oxygen at home, noncompliant with CPAP, insulin-dependent diabetic, A. fib, CAD, failed CABG 2014, CVA, CKD stage III 4, diastolic heart failure presents with 3 days of worsening shortness of breath found to be hypercapnic and hypoxic requiring endotracheal intubation and PCCM admitted patient.  Chest x-ray was consistent with pulmonary edema, PO2 at 78, pH 7.3  Assessment & Plan:   Active Problems:   Acute on chronic respiratory failure with hypoxia and hypercapnia (HCC)   Goals of care, counseling/discussion   Palliative care encounter   Dysphagia   Acute on chronic hypercapnic and hypoxic respiratory failure secondary to decompensated diastolic heart failure, morbid obesity, OSA/OHS Currently on trilogy, CPAP 2 intubations in a month Restarted home dose torsemide 80 mg BID, monitor BMP closely Allergic to prednisone Continue duo nebs, inhalers, cough suppressants  Hypokalemia Replace prn  History of chronic A. fib  Rate controlled Continue with Eliquis and amiodarone  Chronic kidney disease stage III Stable, Cr baseline range 2.6---2.7 BMP  Diabetes mellitus type 2 SSI, Accu-Cheks, hypoglycemic protocol  Chronic lower extremity wounds Present on admission. Continue with local care  Morbid obesity Advised lifestyle modification  Stage II buttock decubitus 1 pressure injury Present on admission, wound care      Nutrition Problem: Increased nutrient needs Etiology: acute illness    Signs/Symptoms: estimated needs    Interventions: Magic cup, Premier Protein  Estimated body mass index is 50.91 kg/m as calculated from the following:   Height as of this encounter: 5\' 11"   (1.803 m).   Weight as of this encounter: 165.6 kg.   DVT prophylaxis: Eliquis Code Status: DNR Family Communication: Attempted to speak to wife on 05/25/19, called, no answer Disposition Plan: Likely SNF Consultants:   CCM  Palliative care  Procedures:   Mechanical ventilation  Antimicrobials:  None  Subjective: Patient seen and examined at bedside.  Denies any new complaints.  Very eager to get out of bed and sit in a chair.  Objective: Vitals:   05/27/19 0810 05/27/19 0850 05/27/19 1218 05/27/19 1444  BP:  131/76 138/85   Pulse:  78 78   Resp:   20   Temp:   97.9 F (36.6 C)   TempSrc:   Oral   SpO2: 96%  100% 91%  Weight:      Height:        Intake/Output Summary (Last 24 hours) at 05/27/2019 1658 Last data filed at 05/27/2019 1145 Gross per 24 hour  Intake 600 ml  Output 1800 ml  Net -1200 ml   Filed Weights   05/24/19 0534 05/25/19 0504 05/26/19 0500  Weight: (!) 165.1 kg (!) 163.3 kg (!) 165.6 kg    Examination:  General: NAD, morbid obesity  Cardiovascular: S1, S2 present  Respiratory:  Diminished breath sounds bilaterally  Abdomen: Soft, nontender, nondistended, bowel sounds present  Musculoskeletal: Bilateral LE with ACE wrap  Skin: As above  Psychiatry: Normal mood    Data Reviewed: I have personally reviewed following labs and imaging studies  CBC: Recent Labs  Lab 05/22/19 0248 05/27/19 0203  WBC 10.1 10.2  NEUTROABS 6.3 6.4  HGB 11.1* 10.9*  HCT 37.3* 37.0*  MCV 99.2 99.2  PLT 322 244   Basic Metabolic Panel:  Recent Labs  Lab 05/22/19 0248 05/23/19 0232 05/24/19 0823 05/27/19 0203  NA 139 140 139 141  K 3.2* 3.3* 3.6 3.5  CL 95* 93* 97* 97*  CO2 33* 36* 31 32  GLUCOSE 172* 153* 162* 199*  BUN 22* 21* 20 22*  CREATININE 2.10* 2.20* 2.20* 2.19*  CALCIUM 8.9 8.8* 9.1 8.8*   GFR: Estimated Creatinine Clearance: 57.9 mL/min (A) (by C-G formula based on SCr of 2.19 mg/dL (H)). Liver Function Tests: No results  for input(s): AST, ALT, ALKPHOS, BILITOT, PROT, ALBUMIN in the last 168 hours. No results for input(s): LIPASE, AMYLASE in the last 168 hours. No results for input(s): AMMONIA in the last 168 hours. Coagulation Profile: No results for input(s): INR, PROTIME in the last 168 hours. Cardiac Enzymes: No results for input(s): CKTOTAL, CKMB, CKMBINDEX, TROPONINI in the last 168 hours. BNP (last 3 results) No results for input(s): PROBNP in the last 8760 hours. HbA1C: No results for input(s): HGBA1C in the last 72 hours. CBG: Recent Labs  Lab 05/27/19 0011 05/27/19 0515 05/27/19 0811 05/27/19 1214 05/27/19 1545  GLUCAP 199* 179* 258* 341* 297*   Lipid Profile: No results for input(s): CHOL, HDL, LDLCALC, TRIG, CHOLHDL, LDLDIRECT in the last 72 hours. Thyroid Function Tests: No results for input(s): TSH, T4TOTAL, FREET4, T3FREE, THYROIDAB in the last 72 hours. Anemia Panel: No results for input(s): VITAMINB12, FOLATE, FERRITIN, TIBC, IRON, RETICCTPCT in the last 72 hours. Sepsis Labs: No results for input(s): PROCALCITON, LATICACIDVEN in the last 168 hours.  No results found for this or any previous visit (from the past 240 hour(s)).       Radiology Studies: No results found.      Scheduled Meds: . amiodarone  100 mg Oral Daily  . apixaban  5 mg Oral BID  . artificial tears   Both Eyes QHS  . budesonide (PULMICORT) nebulizer solution  0.25 mg Nebulization BID  . Chlorhexidine Gluconate Cloth  6 each Topical Daily  . dextromethorphan-guaiFENesin  2 tablet Oral BID  . hydroxypropyl methylcellulose / hypromellose  1 drop Both Eyes TID  . insulin aspart  0-15 Units Subcutaneous Q4H  . ipratropium  0.5 mg Nebulization BID  . isosorbide mononitrate  30 mg Oral QHS  . levalbuterol  0.63 mg Nebulization BID  . mouth rinse  15 mL Mouth Rinse BID  . pantoprazole  40 mg Oral QHS  . potassium chloride  40 mEq Oral Daily  . Ensure Max Protein  11 oz Oral Daily  . rosuvastatin   20 mg Oral q1800  . senna  1 tablet Oral QHS  . sodium chloride flush  10-40 mL Intracatheter Q12H  . topiramate  100 mg Oral QHS  . torsemide  80 mg Oral BID   Continuous Infusions:    LOS: 15 days       Alma Friendly, MD [facility]   If 7PM-7AM, please contact night-coverage [email or web address] 05/27/2019, 4:58 PM

## 2019-05-27 NOTE — Progress Notes (Signed)
Nutrition Follow-up  RD working remotely.  DOCUMENTATION CODES:   Morbid obesity  INTERVENTION:   -Continue Magic cup TID with meals, each supplement provides 290 kcal and 9 grams of protein -Continue Ensure Max po daily, each supplement provides 150 kcal and 30 grams of protein  NUTRITION DIAGNOSIS:   Increased nutrient needs related to acute illness as evidenced by estimated needs.  Ongoing  GOAL:   Patient will meet greater than or equal to 90% of their needs  Progressing   MONITOR:   PO intake, Supplement acceptance, Diet advancement, Labs, Weight trends, Skin, I & O's  REASON FOR ASSESSMENT:   Ventilator    ASSESSMENT:   Patient with PMH significant for OHS, OSA, chronic respiratory failure, DM, CAD, failed CABG 2014, CVA, CKD III-IV, and CHF. Presents this admission with respiratory failure likely related to acute compensation of CHF.  9/25- s/p BSE- advanced to thin liquids (remains on dysphagia 2 diet) 9/30- advanced to dysphagia 3 diet with thin liquids  Reviewed I/O's: -3 L x 24 hours and -29.3 L since 05/13/19  UOP: 3.7 L since admission  Pt remains with good appetite. Noted meal completion 100%. Per MAR, pt taking Ensure Max supplements.   Per CSW notes, awaiting SNF placement with hospice services at discharge.   Labs reviewed: CBGS: 179-341 (inpatient orders for glycemic control are 0-15 units insulin aspart every 4 hours).   Diet Order:   Diet Order            DIET DYS 3 Room service appropriate? Yes; Fluid consistency: Thin  Diet effective now              EDUCATION NEEDS:   Not appropriate for education at this time  Skin:  Skin Assessment: Skin Integrity Issues: Skin Integrity Issues:: Other (Comment) DTI: - Stage II: - Other: venous stasis ulcer to lt tibia  Last BM:  05/25/19  Height:   Ht Readings from Last 1 Encounters:  05/12/19 5\' 11"  (1.803 m)    Weight:   Wt Readings from Last 1 Encounters:  05/26/19 (!) 165.6  kg    Ideal Body Weight:  78.2 kg  BMI:  Body mass index is 50.91 kg/m.  Estimated Nutritional Needs:   Kcal:  2100-2300 kcal  Protein:  105-120 grams  Fluid:  >/= 2.1 L/day    Gaye Scorza A. Jimmye Norman, RD, LDN, [place] Registered Dietitian II Certified Diabetes Care and Education Specialist Pager: [phone] After hours Pager: [phone]

## 2019-05-28 ENCOUNTER — Other Ambulatory Visit: Payer: Self-pay

## 2019-05-28 DIAGNOSIS — N183 Chronic kidney disease, stage 3 unspecified: Secondary | ICD-10-CM

## 2019-05-28 DIAGNOSIS — I5033 Acute on chronic diastolic (congestive) heart failure: Secondary | ICD-10-CM

## 2019-05-28 LAB — CBC WITH DIFFERENTIAL/PLATELET
Abs Immature Granulocytes: 0.07 10*3/uL (ref 0.00–0.07)
Basophils Absolute: 0.1 10*3/uL (ref 0.0–0.1)
Basophils Relative: 1 %
Eosinophils Absolute: 0.7 10*3/uL — ABNORMAL HIGH (ref 0.0–0.5)
Eosinophils Relative: 6 %
HCT: 37.4 % — ABNORMAL LOW (ref 39.0–52.0)
Hemoglobin: 11.2 g/dL — ABNORMAL LOW (ref 13.0–17.0)
Immature Granulocytes: 1 %
Lymphocytes Relative: 17 %
Lymphs Abs: 1.8 10*3/uL (ref 0.7–4.0)
MCH: 29.2 pg (ref 26.0–34.0)
MCHC: 29.9 g/dL — ABNORMAL LOW (ref 30.0–36.0)
MCV: 97.7 fL (ref 80.0–100.0)
Monocytes Absolute: 1 10*3/uL (ref 0.1–1.0)
Monocytes Relative: 9 %
Neutro Abs: 7.3 10*3/uL (ref 1.7–7.7)
Neutrophils Relative %: 66 %
Platelets: 270 10*3/uL (ref 150–400)
RBC: 3.83 MIL/uL — ABNORMAL LOW (ref 4.22–5.81)
RDW: 13.9 % (ref 11.5–15.5)
WBC: 11 10*3/uL — ABNORMAL HIGH (ref 4.0–10.5)
nRBC: 0 % (ref 0.0–0.2)

## 2019-05-28 LAB — BASIC METABOLIC PANEL
Anion gap: 10 (ref 5–15)
BUN: 20 mg/dL (ref 6–20)
CO2: 31 mmol/L (ref 22–32)
Calcium: 8.4 mg/dL — ABNORMAL LOW (ref 8.9–10.3)
Chloride: 98 mmol/L (ref 98–111)
Creatinine, Ser: 2.02 mg/dL — ABNORMAL HIGH (ref 0.61–1.24)
GFR calc Af Amer: 41 mL/min — ABNORMAL LOW (ref >60)
GFR calc non Af Amer: 35 mL/min — ABNORMAL LOW (ref >60)
Glucose, Bld: 228 mg/dL — ABNORMAL HIGH (ref 70–99)
Potassium: 3.1 mmol/L — ABNORMAL LOW (ref 3.5–5.1)
Sodium: 139 mmol/L (ref 135–145)

## 2019-05-28 LAB — GLUCOSE, CAPILLARY
Glucose-Capillary: 158 mg/dL — ABNORMAL HIGH (ref 70–99)
Glucose-Capillary: 171 mg/dL — ABNORMAL HIGH (ref 70–99)
Glucose-Capillary: 191 mg/dL — ABNORMAL HIGH (ref 70–99)
Glucose-Capillary: 193 mg/dL — ABNORMAL HIGH (ref 70–99)
Glucose-Capillary: 227 mg/dL — ABNORMAL HIGH (ref 70–99)
Glucose-Capillary: 266 mg/dL — ABNORMAL HIGH (ref 70–99)
Glucose-Capillary: 369 mg/dL — ABNORMAL HIGH (ref 70–99)

## 2019-05-28 MED ORDER — INSULIN GLARGINE 100 UNIT/ML ~~LOC~~ SOLN
20.0000 [IU] | Freq: Every day | SUBCUTANEOUS | Status: DC
  Administered 2019-05-28 – 2019-06-04 (×8): 20 [IU] via SUBCUTANEOUS
  Filled 2019-05-28 (×8): qty 0.2

## 2019-05-28 MED ORDER — POTASSIUM CHLORIDE CRYS ER 20 MEQ PO TBCR
40.0000 meq | EXTENDED_RELEASE_TABLET | Freq: Once | ORAL | Status: AC
  Administered 2019-05-28: 05:00:00 40 meq via ORAL
  Filled 2019-05-28: qty 2

## 2019-05-28 NOTE — Progress Notes (Signed)
RT NOTES: Patient has home bipap at bedside and takes on and off without assistance

## 2019-05-28 NOTE — Progress Notes (Signed)
Physical Therapy Treatment Patient Details Name: [name]. MRN: [id number] DOB: 1960-12-28 Today's Date: 05/28/2019    History of Present Illness Pt is a 58 yo male admitted with worsening SOB, found to hypercarbic and hypoxic. ETT 9/21/9/23. PMH OHS, OSA, chronic respiratory failure with hypoxia and hypercapnia, on baseline 2 to 3 L/min home oxygen, noncompliant with CPAP IDDM, Afib, CAD, failed CABG in 2014, CVA, CKD stage 3-4, and diastolic HF    PT Comments    Pt tolerated treatment well although ambulation tolerance continues to remain limited. Pt reports he did not have increased work of breathing, rather a feeling of heaviness, resulting in him deferring increased ambulation distances. Pt requires physical assistance for bed mobility and all OOB activity due to balance, gait, and mobility deficits. Pt will continue to benefit from acute PT services to reduce falls risk and restore prior level of function.   Follow Up Recommendations  SNF;Supervision/Assistance - 24 hour     Equipment Recommendations  None recommended by PT    Recommendations for Other Services       Precautions / Restrictions Precautions Precautions: Fall Restrictions Weight Bearing Restrictions: No    Mobility  Bed Mobility Overal bed mobility: Needs Assistance Bed Mobility: Supine to Sit     Supine to sit: Min assist     General bed mobility comments: pt pulling through PT UE support to get from sidelying to sit  Transfers Overall transfer level: Needs assistance Equipment used: Rolling walker (2 wheeled)(bariatric RW) Transfers: Sit to/from Stand Sit to Stand: Min assist         General transfer comment: minG from bed, minA from low commode, utilizing grab bar as well  Ambulation/Gait Ambulation/Gait assistance: Min guard Gait Distance (Feet): 10 Feet(10' x 2) Assistive device: Rolling walker (2 wheeled)(bariatric RW) Gait Pattern/deviations: Step-to pattern;Shuffle;Wide  base of support Gait velocity: decreased Gait velocity interpretation: <1.31 ft/sec, indicative of household ambulator General Gait Details: pt with widened BOS and short shuffling steps. RW held anteriorly with forward trunk lean.   Stairs             Wheelchair Mobility    Modified Rankin (Stroke Patients Only)       Balance Overall balance assessment: Needs assistance Sitting-balance support: Single extremity supported;Feet supported Sitting balance-Leahy Scale: Fair Sitting balance - Comments: supervision for sitting balance   Standing balance support: Bilateral upper extremity supported Standing balance-Leahy Scale: Fair Standing balance comment: close supervision with BUE support of bariatric RW                            Cognition Arousal/Alertness: Awake/alert Behavior During Therapy: WFL for tasks assessed/performed Overall Cognitive Status: History of cognitive impairments - at baseline                                        Exercises      General Comments        Pertinent Vitals/Pain Pain Assessment: No/denies pain    Home Living                      Prior Function            PT Goals (current goals can now be found in the care plan section) Acute Rehab PT Goals Patient Stated Goal: get stronger and get home Progress  towards PT goals: Progressing toward goals    Frequency    Min 3X/week      PT Plan Current plan remains appropriate    Co-evaluation              AM-PAC PT "6 Clicks" Mobility   Outcome Measure  Help needed turning from your back to your side while in a flat bed without using bedrails?: None Help needed moving from lying on your back to sitting on the side of a flat bed without using bedrails?: A Little Help needed moving to and from a bed to a chair (including a wheelchair)?: A Little Help needed standing up from a chair using your arms (e.g., wheelchair or bedside  chair)?: A Little Help needed to walk in hospital room?: A Little Help needed climbing 3-5 steps with a railing? : A Lot 6 Click Score: 18    End of Session Equipment Utilized During Treatment: Gait belt;Oxygen Activity Tolerance: Patient tolerated treatment well Patient left: in chair;with call bell/phone within reach Nurse Communication: Mobility status PT Visit Diagnosis: Muscle weakness (generalized) (M62.81);Difficulty in walking, not elsewhere classified (R26.2);Unsteadiness on feet (R26.81)     Time: 7867-5449 PT Time Calculation (min) (ACUTE ONLY): 31 min  Charges:  $Gait Training: 8-22 mins $Therapeutic Activity: 8-22 mins                     Zenaida Niece, PT, DPT Acute Rehabilitation Pager: [phone]    Zenaida Niece 05/28/2019, 3:35 PM

## 2019-05-28 NOTE — Progress Notes (Signed)
Inpatient Diabetes Program Recommendations  AACE/ADA: New Consensus Statement on Inpatient Glycemic Control (2015)  Target Ranges:  Prepandial:   less than 140 mg/dL      Peak postprandial:   less than 180 mg/dL (1-2 hours)      Critically ill patients:  140 - 180 mg/dL   Lab Results  Component Value Date   GLUCAP 227 (H) 05/28/2019   HGBA1C 7.8 (H) 04/14/2019    Review of Glycemic Control Results for KEONTE, DAUBENSPECK (MRN [id number]) as of 05/28/2019 13:08  Ref. Range 05/27/2019 20:13 05/28/2019 00:44 05/28/2019 04:35 05/28/2019 08:18 05/28/2019 11:18  Glucose-Capillary Latest Ref Range: 70 - 99 mg/dL 180 (H) 266 (H) 171 (H) 193 (H) 227 (H)   Diabetes history: DM 2 Outpatient Diabetes medications: Lantus 45 units q HS Current orders for Inpatient glycemic control:  Novolog moderate q 4 hours Inpatient Diabetes Program Recommendations:   Please consider adding Lantus 22 units daily (this is 1/2 of home basal dose).   Thanks,  Adah Perl, RN, BC-ADM Inpatient Diabetes Coordinator Pager [phone] (8a-5p)

## 2019-05-28 NOTE — Patient Outreach (Signed)
  [facility]) Care Management Chronic Special Needs Program   05/28/2019  Name: [name]., DOB: [birth year]  MRN: [id number]  The client was discussed in today's interdisciplinary care team meeting.  The following issues were discussed:  Client's needs, Changes in health status, Care Plan, Coordination of care and Care transitions  Participants present:                    Thea Silversmith, MSN, RN, CCM                     Melissa Sandlin RN,BSN,CCM, CDE  Kelli Churn, RN, CCM, CDE Quinn Plowman RN, BSN, CCM            Marco Collie, MD Maryella Shivers, MD            Gilda Crease, PharmD, RPh Bary Castilla, RN, BSN, MS, CCM Coralie Carpen, MD Natividad Brood, [facility], hospital liaison  Plan: [facility] will continue to follow as client's chronic care management coordinator.  Thea Silversmith, RN, MSN, [place] Ruby [phone]

## 2019-05-28 NOTE — Plan of Care (Signed)
  Problem: Education: Goal: Knowledge of General Education information will improve Description: Including pain rating scale, medication(s)/side effects and non-pharmacologic comfort measures Outcome: Progressing   Problem: Clinical Measurements: Goal: Ability to maintain clinical measurements within normal limits will improve Outcome: Progressing Goal: Respiratory complications will improve Outcome: Progressing Goal: Cardiovascular complication will be avoided Outcome: Progressing   Problem: Coping: Goal: Level of anxiety will decrease Outcome: Progressing

## 2019-05-28 NOTE — Progress Notes (Signed)
Patient ID: [name]., male   DOB: 04-Feb-1961, 58 y.o.   MRN: [id number]  PROGRESS NOTE    [name].  [id number]:[id number] DOB: April 20, 1961 DOA: 05/12/2019 PCP: Caren Macadam, MD   Brief Narrative:  58 year old male with history of obesity, OHS, OSA, chronic respiratory failure with hypoxia and hypercapnia on 2 to 3 L oxygen at home, noncompliant with CPAP, diabetes mellitus type 2 on insulin, chronic A. fib, CAD, failed CABG in 2014, nonspecific CVA, CKD stage III, chronic diastolic heart failure presented with worsening shortness of breath and was found to be more hypoxic and hypercapnic requiring endotracheal intubation.  During the hospitalization, he was extubated and transferred to the floor.  Palliative care was consulted.  CODE STATUS was changed to DNR.  Assessment & Plan:   Acute on chronic hypercapnic and hypoxic respiratory failure Acute on chronic diastolic heart failure OSA/OHS, noncompliance to CPAP -Patient had to be intubated on admission and subsequently has been extubated.  Overall prognosis is very poor. -Palliative care is following. -Hospice discussions were also started but wife wants to pursue rehab options.  Outpatient rehab will not accept the patient on trilogy vent.  Patient apparently not so compliant with trilogy vent or CPAP. -We will switch to BiPAP and hope that rehab facilities will accept the patient on BiPAP. -If condition worsens, consider full comfort measures. -Patient has been switched to DNR already.  He has had 2 intubations in a month. -Echo on 02/22/2019 had shown EF of 55 to 60%.  Continue home torsemide dose of 80 mg twice a day.  Continue isosorbide mononitrate. -Strict input and output.  Daily weights.  Hypokalemia -Replace.  Repeat a.m. labs  History of chronic A. Fib -Continue Eliquis and amiodarone.  Rate controlled.  Chronic kidney disease stage III -Baseline creatinine 2.6-2.7.  Stable.  Monitor.  Diabetes  mellitus type II--continue CBGs with SSI.  Chronic lower extremity wounds: Present on admission next -continue local wound care  Morbid obesity  -outpatient follow-up   DVT prophylaxis: Eliquis Code Status: DNR Family Communication: None at bedside Disposition Plan: SNF once bed is available  Consultants: PCCM/palliative care  Procedures: Intubation/extubation  Antimicrobials:  None  Subjective: Patient seen and examined at bedside.  He is awake, very poor historian.  Keeps asking me when he could go home.  No overnight fever or vomiting.  Objective: Vitals:   05/28/19 0440 05/28/19 0500 05/28/19 0800 05/28/19 1251  BP: 133/75  136/60 119/75  Pulse: 81  72 85  Resp: 18   18  Temp: 97.8 F (36.6 C)     TempSrc:      SpO2: 98%   100%  Weight:  (!) 163.7 kg    Height:        Intake/Output Summary (Last 24 hours) at 05/28/2019 1426 Last data filed at 05/28/2019 1251 Gross per 24 hour  Intake 480 ml  Output 3050 ml  Net -2570 ml   Filed Weights   05/25/19 0504 05/26/19 0500 05/28/19 0500  Weight: (!) 163.3 kg (!) 165.6 kg (!) 163.7 kg    Examination:  General exam: Appears calm and comfortable.  No distress.  Looks older than stated age.  Poor historian Respiratory system: Bilateral decreased breath sounds at bases with basilar crackles Cardiovascular system: S1 & S2 heard, Rate controlled Gastrointestinal system: Abdomen is nondistended, soft and nontender. Normal bowel sounds heard. Extremities: No cyanosis, clubbing; lower extremity edema present  Data Reviewed: I have personally reviewed following labs  and imaging studies  CBC: Recent Labs  Lab 05/22/19 0248 05/27/19 0203 05/28/19 0226  WBC 10.1 10.2 11.0*  NEUTROABS 6.3 6.4 7.3  HGB 11.1* 10.9* 11.2*  HCT 37.3* 37.0* 37.4*  MCV 99.2 99.2 97.7  PLT 322 275 269   Basic Metabolic Panel: Recent Labs  Lab 05/22/19 0248 05/23/19 0232 05/24/19 0823 05/27/19 0203 05/28/19 0226  NA 139 140 139 141  139  K 3.2* 3.3* 3.6 3.5 3.1*  CL 95* 93* 97* 97* 98  CO2 33* 36* 31 32 31  GLUCOSE 172* 153* 162* 199* 228*  BUN 22* 21* 20 22* 20  CREATININE 2.10* 2.20* 2.20* 2.19* 2.02*  CALCIUM 8.9 8.8* 9.1 8.8* 8.4*   GFR: Estimated Creatinine Clearance: 62.4 mL/min (A) (by C-G formula based on SCr of 2.02 mg/dL (H)). Liver Function Tests: No results for input(s): AST, ALT, ALKPHOS, BILITOT, PROT, ALBUMIN in the last 168 hours. No results for input(s): LIPASE, AMYLASE in the last 168 hours. No results for input(s): AMMONIA in the last 168 hours. Coagulation Profile: No results for input(s): INR, PROTIME in the last 168 hours. Cardiac Enzymes: No results for input(s): CKTOTAL, CKMB, CKMBINDEX, TROPONINI in the last 168 hours. BNP (last 3 results) No results for input(s): PROBNP in the last 8760 hours. HbA1C: No results for input(s): HGBA1C in the last 72 hours. CBG: Recent Labs  Lab 05/27/19 2013 05/28/19 0044 05/28/19 0435 05/28/19 0818 05/28/19 1118  GLUCAP 180* 266* 171* 193* 227*   Lipid Profile: No results for input(s): CHOL, HDL, LDLCALC, TRIG, CHOLHDL, LDLDIRECT in the last 72 hours. Thyroid Function Tests: No results for input(s): TSH, T4TOTAL, FREET4, T3FREE, THYROIDAB in the last 72 hours. Anemia Panel: No results for input(s): VITAMINB12, FOLATE, FERRITIN, TIBC, IRON, RETICCTPCT in the last 72 hours. Sepsis Labs: No results for input(s): PROCALCITON, LATICACIDVEN in the last 168 hours.  No results found for this or any previous visit (from the past 240 hour(s)).       Radiology Studies: No results found.      Scheduled Meds: . amiodarone  100 mg Oral Daily  . apixaban  5 mg Oral BID  . artificial tears   Both Eyes QHS  . budesonide (PULMICORT) nebulizer solution  0.25 mg Nebulization BID  . Chlorhexidine Gluconate Cloth  6 each Topical Daily  . dextromethorphan-guaiFENesin  2 tablet Oral BID  . hydroxypropyl methylcellulose / hypromellose  1 drop Both  Eyes TID  . insulin aspart  0-15 Units Subcutaneous Q4H  . ipratropium  0.5 mg Nebulization BID  . isosorbide mononitrate  30 mg Oral QHS  . levalbuterol  0.63 mg Nebulization BID  . mouth rinse  15 mL Mouth Rinse BID  . pantoprazole  40 mg Oral QHS  . potassium chloride  40 mEq Oral Daily  . Ensure Max Protein  11 oz Oral Daily  . rosuvastatin  20 mg Oral q1800  . senna  1 tablet Oral QHS  . sodium chloride flush  10-40 mL Intracatheter Q12H  . topiramate  100 mg Oral QHS  . torsemide  80 mg Oral BID   Continuous Infusions:        Aline August, MD [facility] 05/28/2019, 2:26 PM

## 2019-05-28 NOTE — Progress Notes (Signed)
Patient states he requires no assistance with his trilogy machine. He also demands to be allowed to wear his nasal cannula under his mask.

## 2019-05-28 NOTE — Consult Note (Signed)
   [facility] CM Inpatient Consult   05/28/2019  [name]. 08-20-1961 [id number]  Follow up: Care Coordination  Updated on disposition needs and barriers and patient's chart briefed for progress.  Spoke with inpatient TOC RNCM and with [place] Coordinator of ongoing follow up with Trilogy.  Natividad Brood, RN BSN [facility] Liaison  [phone] business mobile phone Toll free office [phone]  Fax number: [phone] [email or web address]

## 2019-05-29 LAB — BASIC METABOLIC PANEL
Anion gap: 14 (ref 5–15)
BUN: 21 mg/dL — ABNORMAL HIGH (ref 6–20)
CO2: 31 mmol/L (ref 22–32)
Calcium: 8.7 mg/dL — ABNORMAL LOW (ref 8.9–10.3)
Chloride: 94 mmol/L — ABNORMAL LOW (ref 98–111)
Creatinine, Ser: 2.11 mg/dL — ABNORMAL HIGH (ref 0.61–1.24)
GFR calc Af Amer: 39 mL/min — ABNORMAL LOW (ref >60)
GFR calc non Af Amer: 33 mL/min — ABNORMAL LOW (ref >60)
Glucose, Bld: 168 mg/dL — ABNORMAL HIGH (ref 70–99)
Potassium: 3.4 mmol/L — ABNORMAL LOW (ref 3.5–5.1)
Sodium: 139 mmol/L (ref 135–145)

## 2019-05-29 LAB — GLUCOSE, CAPILLARY
Glucose-Capillary: 159 mg/dL — ABNORMAL HIGH (ref 70–99)
Glucose-Capillary: 175 mg/dL — ABNORMAL HIGH (ref 70–99)
Glucose-Capillary: 177 mg/dL — ABNORMAL HIGH (ref 70–99)
Glucose-Capillary: 199 mg/dL — ABNORMAL HIGH (ref 70–99)
Glucose-Capillary: 199 mg/dL — ABNORMAL HIGH (ref 70–99)
Glucose-Capillary: 216 mg/dL — ABNORMAL HIGH (ref 70–99)

## 2019-05-29 LAB — MAGNESIUM: Magnesium: 2.1 mg/dL (ref 1.7–2.4)

## 2019-05-29 MED ORDER — BISACODYL 10 MG RE SUPP: 10.0000 mg | Freq: Every day | RECTAL | Status: DC | PRN

## 2019-05-29 MED ORDER — SENNOSIDES-DOCUSATE SODIUM 8.6-50 MG PO TABS
1.0000 | ORAL_TABLET | Freq: Two times a day (BID) | ORAL | Status: DC
  Administered 2019-05-29 – 2019-06-04 (×11): 1 via ORAL
  Filled 2019-05-29 (×13): qty 1

## 2019-05-29 MED ORDER — POLYETHYLENE GLYCOL 3350 17 G PO PACK: 17.0000 g | PACK | Freq: Every day | ORAL | Status: DC | PRN

## 2019-05-29 MED ORDER — POTASSIUM CHLORIDE CRYS ER 20 MEQ PO TBCR
40.0000 meq | EXTENDED_RELEASE_TABLET | Freq: Every day | ORAL | Status: DC
  Administered 2019-05-29: 40 meq via ORAL
  Filled 2019-05-29: qty 2

## 2019-05-29 NOTE — Progress Notes (Addendum)
Patient ID: Frank Larsen., male   DOB: March 23, 1961, 58 y.o.   MRN: [id number]  PROGRESS NOTE    Frank Larsen.  [id number] DOB: 23-Oct-1960 DOA: 05/12/2019 PCP: Caren Macadam, MD   Brief Narrative:  58 year old male with history of obesity, OHS, OSA, chronic respiratory failure with hypoxia and hypercapnia on 2 to 3 L oxygen at home, noncompliant with CPAP, diabetes mellitus type 2 on insulin, chronic A. fib, CAD, failed CABG in 2014, nonspecific CVA, CKD stage III, chronic diastolic heart failure presented with worsening shortness of breath and was found to be more hypoxic and hypercapnic requiring endotracheal intubation.  During the hospitalization, he was extubated and transferred to the floor.  Palliative care was consulted.  CODE STATUS was changed to DNR.   Assessment & Plan:   Acute on chronic hypercapnic and hypoxic respiratory failure Acute on chronic diastolic heart failure OSA/OHS, noncompliance to CPAP -Patient had to be intubated on admission and subsequently has been extubated.  Overall prognosis is very poor. -Palliative care is following. -Hospice discussions were also started but wife wants to pursue rehab options.  Outpatient rehab will not accept the patient on trilogy vent.  Patient apparently not so compliant with trilogy vent or CPAP at home. -switch to BiPAP and hope that rehab facilities will accept the patient on BiPAP.  Patient had refused BiPAP during last admission.  Explained to the patient again today regarding the importance of using BiPAP at night and as needed during the day to see if that helps for qualification for rehab placement. -If condition worsens, consider full comfort measures. -Patient has been switched to DNR already.  He has had 2 intubations in a month. -Echo on 02/22/2019 had shown EF of 55 to 60%.  Continue home torsemide dose of 80 mg twice a day.  Continue isosorbide mononitrate. -Strict input and output.  Daily  weights.  Fluid restriction.  Hypokalemia -Replace.  Repeat a.m. labs   History of chronic A. Fib -Continue Eliquis and amiodarone.  Rate controlled.  Chronic kidney disease stage III -Baseline creatinine 2.6-2.7.  Monitor creatinine.  Creatinine 2.11 today.  Diabetes mellitus type II--continue CBGs with SSI.  Chronic lower extremity wounds: Present on admission next -continue local wound care  Morbid obesity  -outpatient follow-up  Generalized conditioning Overall very poor prognosis -Education officer, museum following for SNF placement.  If condition worsen, consider hospice/comfort measures.   DVT prophylaxis: Eliquis Code Status: DNR Family Communication: Called wife/[name] on phone on 05/29/2019 Disposition Plan: SNF once bed is available  Consultants: PCCM/palliative care  Procedures: Intubation/extubation  Antimicrobials:  None  Subjective: Patient seen and examined at bedside.  He is awake, very poor historian.  No overnight fever or vomiting reported nursing staff.  No worsening shortness of breath or chest pain.  Objective: Vitals:   05/28/19 2117 05/29/19 0312 05/29/19 0435 05/29/19 0713  BP: (!) 146/84  119/73   Pulse: 70  76 81  Resp: 16  16 18   Temp: 97.7 F (36.5 C)  98.4 F (36.9 C)   TempSrc: Oral     SpO2: 100%  99% 97%  Weight:  (!) 165.1 kg    Height:        Intake/Output Summary (Last 24 hours) at 05/29/2019 0745 Last data filed at 05/29/2019 0508 Gross per 24 hour  Intake 480 ml  Output 3600 ml  Net -3120 ml   Filed Weights   05/26/19 0500 05/28/19 0500 05/29/19 0312  Weight: (!) 165.6  kg (!) 163.7 kg (!) 165.1 kg    Examination:  General exam: Appears calm and comfortable.  No acute distress.  Looks older than stated age.  Poor historian Respiratory system: Bilateral decreased breath sounds at bases with some crackles.  No wheezing  cardiovascular system: Rate controlled, S1-S2 heard Gastrointestinal system: Abdomen is morbidly  obese, nondistended, soft and nontender. Normal bowel sounds heard. Extremities: No cyanosis; lower extremity edema present  Data Reviewed: I have personally reviewed following labs and imaging studies  CBC: Recent Labs  Lab 05/27/19 0203 05/28/19 0226  WBC 10.2 11.0*  NEUTROABS 6.4 7.3  HGB 10.9* 11.2*  HCT 37.0* 37.4*  MCV 99.2 97.7  PLT 275 546   Basic Metabolic Panel: Recent Labs  Lab 05/23/19 0232 05/24/19 0823 05/27/19 0203 05/28/19 0226  NA 140 139 141 139  K 3.3* 3.6 3.5 3.1*  CL 93* 97* 97* 98  CO2 36* 31 32 31  GLUCOSE 153* 162* 199* 228*  BUN 21* 20 22* 20  CREATININE 2.20* 2.20* 2.19* 2.02*  CALCIUM 8.8* 9.1 8.8* 8.4*   GFR: Estimated Creatinine Clearance: 62.7 mL/min (A) (by C-G formula based on SCr of 2.02 mg/dL (H)). Liver Function Tests: No results for input(s): AST, ALT, ALKPHOS, BILITOT, PROT, ALBUMIN in the last 168 hours. No results for input(s): LIPASE, AMYLASE in the last 168 hours. No results for input(s): AMMONIA in the last 168 hours. Coagulation Profile: No results for input(s): INR, PROTIME in the last 168 hours. Cardiac Enzymes: No results for input(s): CKTOTAL, CKMB, CKMBINDEX, TROPONINI in the last 168 hours. BNP (last 3 results) No results for input(s): PROBNP in the last 8760 hours. HbA1C: No results for input(s): HGBA1C in the last 72 hours. CBG: Recent Labs  Lab 05/28/19 1118 05/28/19 1618 05/28/19 1942 05/28/19 2337 05/29/19 0430  GLUCAP 227* 369* 191* 158* 175*   Lipid Profile: No results for input(s): CHOL, HDL, LDLCALC, TRIG, CHOLHDL, LDLDIRECT in the last 72 hours. Thyroid Function Tests: No results for input(s): TSH, T4TOTAL, FREET4, T3FREE, THYROIDAB in the last 72 hours. Anemia Panel: No results for input(s): VITAMINB12, FOLATE, FERRITIN, TIBC, IRON, RETICCTPCT in the last 72 hours. Sepsis Labs: No results for input(s): PROCALCITON, LATICACIDVEN in the last 168 hours.  No results found for this or any previous  visit (from the past 240 hour(s)).       Radiology Studies: No results found.      Scheduled Meds: . amiodarone  100 mg Oral Daily  . apixaban  5 mg Oral BID  . artificial tears   Both Eyes QHS  . budesonide (PULMICORT) nebulizer solution  0.25 mg Nebulization BID  . Chlorhexidine Gluconate Cloth  6 each Topical Daily  . dextromethorphan-guaiFENesin  2 tablet Oral BID  . hydroxypropyl methylcellulose / hypromellose  1 drop Both Eyes TID  . insulin aspart  0-15 Units Subcutaneous Q4H  . insulin glargine  20 Units Subcutaneous Daily  . ipratropium  0.5 mg Nebulization BID  . isosorbide mononitrate  30 mg Oral QHS  . levalbuterol  0.63 mg Nebulization BID  . mouth rinse  15 mL Mouth Rinse BID  . pantoprazole  40 mg Oral QHS  . potassium chloride  40 mEq Oral Daily  . Ensure Max Protein  11 oz Oral Daily  . rosuvastatin  20 mg Oral q1800  . senna  1 tablet Oral QHS  . sodium chloride flush  10-40 mL Intracatheter Q12H  . topiramate  100 mg Oral QHS  . torsemide  80 mg Oral BID   Continuous Infusions:        Aline August, MD [facility] 05/29/2019, 7:45 AM

## 2019-05-29 NOTE — Progress Notes (Signed)
Report called to 2W and pt transferred to room 2W 09

## 2019-05-30 LAB — GLUCOSE, CAPILLARY
Glucose-Capillary: 167 mg/dL — ABNORMAL HIGH (ref 70–99)
Glucose-Capillary: 175 mg/dL — ABNORMAL HIGH (ref 70–99)
Glucose-Capillary: 175 mg/dL — ABNORMAL HIGH (ref 70–99)
Glucose-Capillary: 182 mg/dL — ABNORMAL HIGH (ref 70–99)
Glucose-Capillary: 186 mg/dL — ABNORMAL HIGH (ref 70–99)
Glucose-Capillary: 197 mg/dL — ABNORMAL HIGH (ref 70–99)

## 2019-05-30 LAB — BASIC METABOLIC PANEL
Anion gap: 12 (ref 5–15)
BUN: 24 mg/dL — ABNORMAL HIGH (ref 6–20)
CO2: 32 mmol/L (ref 22–32)
Calcium: 8.6 mg/dL — ABNORMAL LOW (ref 8.9–10.3)
Chloride: 95 mmol/L — ABNORMAL LOW (ref 98–111)
Creatinine, Ser: 2.2 mg/dL — ABNORMAL HIGH (ref 0.61–1.24)
GFR calc Af Amer: 37 mL/min — ABNORMAL LOW (ref >60)
GFR calc non Af Amer: 32 mL/min — ABNORMAL LOW (ref >60)
Glucose, Bld: 192 mg/dL — ABNORMAL HIGH (ref 70–99)
Potassium: 3.1 mmol/L — ABNORMAL LOW (ref 3.5–5.1)
Sodium: 139 mmol/L (ref 135–145)

## 2019-05-30 LAB — MAGNESIUM: Magnesium: 1.8 mg/dL (ref 1.7–2.4)

## 2019-05-30 MED ORDER — POTASSIUM CHLORIDE CRYS ER 20 MEQ PO TBCR
40.0000 meq | EXTENDED_RELEASE_TABLET | ORAL | Status: AC
  Administered 2019-05-30 (×2): 40 meq via ORAL
  Filled 2019-05-30 (×2): qty 2

## 2019-05-30 MED ORDER — POTASSIUM CHLORIDE CRYS ER 20 MEQ PO TBCR: 40.0000 meq | EXTENDED_RELEASE_TABLET | Freq: Every day | ORAL | Status: DC

## 2019-05-30 NOTE — Progress Notes (Signed)
Pt refuses to drink water from water filter machine, stating that it "makes my stomach worse - I only drink ginger ale". Meds given with diet ginger ale. Takes topamax wrapped in bread.

## 2019-05-30 NOTE — Progress Notes (Signed)
Chewing tobacco found in room by this writer, and two NT's - put in bag, labeled with pt sticker, placed with patient chart.

## 2019-05-30 NOTE — Progress Notes (Signed)
Patient ID: [name]., male   DOB: 10/09/1960, 58 y.o.   MRN: [id number]  PROGRESS NOTE    [name].  [id number] DOB: 06/21/1961 DOA: 05/12/2019 PCP: Caren Macadam, MD   Brief Narrative:  58 year old male with history of obesity, OHS, OSA, chronic respiratory failure with hypoxia and hypercapnia on 2 to 3 L oxygen at home, noncompliant with CPAP, diabetes mellitus type 2 on insulin, chronic A. fib, CAD, failed CABG in 2014, nonspecific CVA, CKD stage III, chronic diastolic heart failure presented with worsening shortness of breath and was found to be more hypoxic and hypercapnic requiring endotracheal intubation.  During the hospitalization, he was extubated and transferred to the floor.  Palliative care was consulted.  CODE STATUS was changed to DNR.   Assessment & Plan:   Acute on chronic hypercapnic and hypoxic respiratory failure Acute on chronic diastolic heart failure OSA/OHS, noncompliance to CPAP -Patient had to be intubated on admission and subsequently has been extubated.  Overall prognosis is very poor. -Palliative care is following. -Hospice discussions were also started but wife wants to pursue rehab options.  Outpatient rehab will not accept the patient on trilogy vent.  Patient apparently not so compliant with trilogy vent or CPAP at home. -switch to BiPAP and hope that rehab facilities will accept the patient on BiPAP.  Patient had refused BiPAP during last admission.  Explained to the patient again today regarding the importance of using BiPAP at night and as needed during the day to see if that helps for qualification for rehab placement. -If condition worsens, consider full comfort measures. -Patient has been switched to DNR already.  He has had 2 intubations in a month. -Echo on 02/22/2019 had shown EF of 55 to 60%.  Continue home torsemide dose of 80 mg twice a day.  Continue isosorbide mononitrate. -Strict input and output.  Daily  weights.  Fluid restriction.  Hypokalemia -Replace.  Repeat a.m. labs   History of chronic A. Fib -Continue Eliquis and amiodarone.  Rate controlled.  Chronic kidney disease stage III -Baseline creatinine 2.6-2.7.  Monitor creatinine.  Creatinine 2.20 today.  Diabetes mellitus type II--continue CBGs with SSI.  Chronic lower extremity wounds: Present on admission next -continue local wound care  Morbid obesity  -outpatient follow-up  Generalized conditioning Overall very poor prognosis -Education officer, museum following for SNF placement.  If condition worsens, consider hospice/comfort measures.   DVT prophylaxis: Eliquis Code Status: DNR Family Communication: Called wife/[name] Skop on phone on 05/29/2019 Disposition Plan: SNF once bed is available  Consultants: PCCM/palliative care  Procedures: Intubation/extubation  Antimicrobials:  None  Subjective: Patient seen and examined at bedside.  He is awake, very poor historian.  No worsening shortness of breath or chest pain reported.  No overnight fever or vomiting.  Objective: Vitals:   05/29/19 1956 05/29/19 1959 05/29/19 2336 05/30/19 0421  BP:   (!) 144/84   Pulse:   69   Resp:      Temp:   97.7 F (36.5 C)   TempSrc:   Oral   SpO2: 98% 98% 98%   Weight:    (!) 157.4 kg  Height:        Intake/Output Summary (Last 24 hours) at 05/30/2019 0732 Last data filed at 05/29/2019 1807 Gross per 24 hour  Intake 480 ml  Output 2600 ml  Net -2120 ml   Filed Weights   05/28/19 0500 05/29/19 0312 05/30/19 0421  Weight: (!) 163.7 kg (!) 165.1 kg Marland Kitchen)  157.4 kg    Examination:  General exam: No distress.  Looks older than stated age.  Poor historian Respiratory system: Bilateral decreased breath sounds at bases, no wheezing.  Basilar crackles present.   Cardiovascular system: S1-S2 heard, rate controlled Gastrointestinal system: Abdomen is morbidly obese, nondistended, soft and nontender. Normal bowel sounds heard.  Extremities: No cyanosis; pitting lower extremity edema present  Data Reviewed: I have personally reviewed following labs and imaging studies  CBC: Recent Labs  Lab 05/27/19 0203 05/28/19 0226  WBC 10.2 11.0*  NEUTROABS 6.4 7.3  HGB 10.9* 11.2*  HCT 37.0* 37.4*  MCV 99.2 97.7  PLT 275 270   Basic Metabolic Panel: Recent Labs  Lab 05/24/19 0823 05/27/19 0203 05/28/19 0226 05/29/19 0752 05/30/19 0446  NA 139 141 139 139 139  K 3.6 3.5 3.1* 3.4* 3.1*  CL 97* 97* 98 94* 95*  CO2 31 32 31 31 32  GLUCOSE 162* 199* 228* 168* 192*  BUN 20 22* 20 21* 24*  CREATININE 2.20* 2.19* 2.02* 2.11* 2.20*  CALCIUM 9.1 8.8* 8.4* 8.7* 8.6*  MG  --   --   --  2.1 1.8   GFR: Estimated Creatinine Clearance: 56 mL/min (A) (by C-G formula based on SCr of 2.2 mg/dL (H)). Liver Function Tests: No results for input(s): AST, ALT, ALKPHOS, BILITOT, PROT, ALBUMIN in the last 168 hours. No results for input(s): LIPASE, AMYLASE in the last 168 hours. No results for input(s): AMMONIA in the last 168 hours. Coagulation Profile: No results for input(s): INR, PROTIME in the last 168 hours. Cardiac Enzymes: No results for input(s): CKTOTAL, CKMB, CKMBINDEX, TROPONINI in the last 168 hours. BNP (last 3 results) No results for input(s): PROBNP in the last 8760 hours. HbA1C: No results for input(s): HGBA1C in the last 72 hours. CBG: Recent Labs  Lab 05/29/19 1620 05/29/19 1956 05/29/19 2334 05/30/19 0355 05/30/19 0730  GLUCAP 199* 177* 216* 186* 182*   Lipid Profile: No results for input(s): CHOL, HDL, LDLCALC, TRIG, CHOLHDL, LDLDIRECT in the last 72 hours. Thyroid Function Tests: No results for input(s): TSH, T4TOTAL, FREET4, T3FREE, THYROIDAB in the last 72 hours. Anemia Panel: No results for input(s): VITAMINB12, FOLATE, FERRITIN, TIBC, IRON, RETICCTPCT in the last 72 hours. Sepsis Labs: No results for input(s): PROCALCITON, LATICACIDVEN in the last 168 hours.  No results found for this  or any previous visit (from the past 240 hour(s)).       Radiology Studies: No results found.      Scheduled Meds: . amiodarone  100 mg Oral Daily  . apixaban  5 mg Oral BID  . artificial tears   Both Eyes QHS  . budesonide (PULMICORT) nebulizer solution  0.25 mg Nebulization BID  . Chlorhexidine Gluconate Cloth  6 each Topical Daily  . dextromethorphan-guaiFENesin  2 tablet Oral BID  . hydroxypropyl methylcellulose / hypromellose  1 drop Both Eyes TID  . insulin aspart  0-15 Units Subcutaneous Q4H  . insulin glargine  20 Units Subcutaneous Daily  . ipratropium  0.5 mg Nebulization BID  . isosorbide mononitrate  30 mg Oral QHS  . levalbuterol  0.63 mg Nebulization BID  . mouth rinse  15 mL Mouth Rinse BID  . pantoprazole  40 mg Oral QHS  . potassium chloride  40 mEq Oral Daily  . Ensure Max Protein  11 oz Oral Daily  . rosuvastatin  20 mg Oral q1800  . senna-docusate  1 tablet Oral BID  . sodium chloride flush  10-40  mL Intracatheter Q12H  . topiramate  100 mg Oral QHS  . torsemide  80 mg Oral BID   Continuous Infusions:        Aline August, MD [facility] 05/30/2019, 7:32 AM

## 2019-05-31 LAB — BASIC METABOLIC PANEL
Anion gap: 10 (ref 5–15)
BUN: 23 mg/dL — ABNORMAL HIGH (ref 6–20)
CO2: 33 mmol/L — ABNORMAL HIGH (ref 22–32)
Calcium: 8.7 mg/dL — ABNORMAL LOW (ref 8.9–10.3)
Chloride: 95 mmol/L — ABNORMAL LOW (ref 98–111)
Creatinine, Ser: 2.05 mg/dL — ABNORMAL HIGH (ref 0.61–1.24)
GFR calc Af Amer: 40 mL/min — ABNORMAL LOW (ref >60)
GFR calc non Af Amer: 35 mL/min — ABNORMAL LOW (ref >60)
Glucose, Bld: 175 mg/dL — ABNORMAL HIGH (ref 70–99)
Potassium: 3.3 mmol/L — ABNORMAL LOW (ref 3.5–5.1)
Sodium: 138 mmol/L (ref 135–145)

## 2019-05-31 LAB — GLUCOSE, CAPILLARY
Glucose-Capillary: 171 mg/dL — ABNORMAL HIGH (ref 70–99)
Glucose-Capillary: 174 mg/dL — ABNORMAL HIGH (ref 70–99)
Glucose-Capillary: 228 mg/dL — ABNORMAL HIGH (ref 70–99)
Glucose-Capillary: 169 mg/dL — ABNORMAL HIGH (ref 70–99)
Glucose-Capillary: 179 mg/dL — ABNORMAL HIGH (ref 70–99)
Glucose-Capillary: 197 mg/dL — ABNORMAL HIGH (ref 70–99)

## 2019-05-31 LAB — MAGNESIUM: Magnesium: 1.8 mg/dL (ref 1.7–2.4)

## 2019-05-31 MED ORDER — POTASSIUM CHLORIDE CRYS ER 20 MEQ PO TBCR
40.0000 meq | EXTENDED_RELEASE_TABLET | Freq: Once | ORAL | Status: AC
  Administered 2019-05-31: 40 meq via ORAL
  Filled 2019-05-31: qty 2

## 2019-05-31 NOTE — TOC Progression Note (Signed)
Transition of Care (TOC) - Progression Note    Patient Details  Name: [name]. MRN: [id number] Date of Birth: [birth year]  Transition of Care [facility]) CM/SW [place], [place] Phone Number: 05/31/2019, 8:20 AM  Clinical Narrative:   CSW following for discharge plan. Noting per chart review that patient placed on trilogy overnight instead of BIPAP; patient needs to be stable utilizing BIPAP at night in order to admit to SNF. Cannot pursue SNF Workup as long as patient continues with the trilogy overnight. CSW to continue to follow.    Expected Discharge Plan: [place] Barriers to Discharge: Continued Medical Work up  Expected Discharge Plan and Services Expected Discharge Plan: [place]   Discharge Planning Services: CM Consult Post Acute Care Choice: Durable Medical Equipment, Home Health Living arrangements for the past 2 months: Single Family Home                                       Social Determinants of Health (SDOH) Interventions    Readmission Risk Interventions Readmission Risk Prevention Plan 04/24/2019 04/18/2019 02/24/2019  Transportation Screening Complete Complete Complete  PCP or Specialist Appt within 3-5 Days - - Not Complete  Not Complete comments - - see above  [place] or [place] - - Complete  Social Work Consult for [place] Planning/Counseling - - Complete  Palliative Care Screening - - Not Applicable  Medication Review Press photographer) Complete Complete Complete  PCP or Specialist appointment within 3-5 days of discharge (No Data) Patient refused -  [place] or Home Care Consult Complete - -  Palliative Care Screening Complete - -  [place] Not Applicable - -  Some recent data might be hidden

## 2019-05-31 NOTE — Progress Notes (Signed)
Patient ID: [name]., male   DOB: 1960-09-18, 58 y.o.   MRN: [id number]  PROGRESS NOTE    [name].  SVX:[id number] DOB: June 14, 1961 DOA: 05/12/2019 PCP: Caren Macadam, MD   Brief Narrative:  58 year old male with history of obesity, OHS, OSA, chronic respiratory failure with hypoxia and hypercapnia on 2 to 3 L oxygen at home, noncompliant with CPAP, diabetes mellitus type 2 on insulin, chronic A. fib, CAD, failed CABG in 2014, nonspecific CVA, CKD stage III, chronic diastolic heart failure presented with worsening shortness of breath and was found to be more hypoxic and hypercapnic requiring endotracheal intubation.  During the hospitalization, he was extubated and transferred to the floor.  Palliative care was consulted.  CODE STATUS was changed to DNR.   Assessment & Plan:   Acute on chronic hypercapnic and hypoxic respiratory failure Acute on chronic diastolic heart failure OSA/OHS, noncompliance to CPAP -Patient had to be intubated on admission and subsequently has been extubated.  Overall prognosis is very poor. -Palliative care is following. -Hospice discussions were also started but wife wants to pursue rehab options.  Outpatient rehab will not accept the patient on trilogy vent.  Patient apparently not so compliant with trilogy vent or CPAP at home. -If condition worsens, consider full comfort measures. -Patient has been switched to DNR already.  He has had 2 intubations in a month. -Echo on 02/22/2019 had shown EF of 55 to 60%.  Continue home torsemide dose of 80 mg twice a day.  Continue isosorbide mononitrate. -Strict input and output.  Daily weights.  Fluid restriction. -Still using Trilogy vent.  Advised the patient as well as the wife on phone regarding the need to use BiPAP during the daytime as needed if he is sleepy and during nighttime.  This may help with rehab options.  Hypokalemia -Replace.  Repeat a.m. labs   History of chronic A. Fib  -Continue Eliquis and amiodarone.  Rate controlled.  Chronic kidney disease stage III -Baseline creatinine 2.6-2.7.  Monitor creatinine.  Creatinine 2.05 today.  Diabetes mellitus type II--continue CBGs with SSI.  Chronic lower extremity wounds: Present on admission next -continue local wound care  Morbid obesity  -outpatient follow-up  Generalized conditioning Overall very poor prognosis -Education officer, museum following for SNF placement.  If condition worsens, consider hospice/comfort measures.   DVT prophylaxis: Eliquis Code Status: DNR Family Communication: Called wife/[name] Heenan on phone on 05/31/2019 Disposition Plan: SNF once bed is available  Consultants: PCCM/palliative care  Procedures: Intubation/extubation  Antimicrobials:  None  Subjective: Patient seen and examined at bedside.  He is awake, very poor historian.  Denies worsening shortness of breath or fever or chest pain overnight.  Objective: Vitals:   05/30/19 1724 05/30/19 1946 05/30/19 1949 05/30/19 2345  BP: 139/80   (!) 146/67  Pulse: (!) 50   85  Resp: 20   20  Temp: 98.3 F (36.8 C)   97.8 F (36.6 C)  TempSrc: Oral   Oral  SpO2: 100% 98% 97% 99%  Weight:      Height:        Intake/Output Summary (Last 24 hours) at 05/31/2019 0723 Last data filed at 05/30/2019 2300 Gross per 24 hour  Intake 840 ml  Output 2700 ml  Net -1860 ml   Filed Weights   05/28/19 0500 05/29/19 0312 05/30/19 0421  Weight: (!) 163.7 kg (!) 165.1 kg (!) 157.4 kg    Examination:  General exam: No acute distress.  Looks older  than stated age.  Poor historian Respiratory system: Bilateral decreased breath sounds at bases with basilar crackles  cardiovascular system: Rate controlled, S1-S2 heard Gastrointestinal system: Abdomen is morbidly obese, nondistended, soft and nontender. Normal bowel sounds heard. Extremities: No cyanosis; pitting lower extremity edema present  Data Reviewed: I have personally reviewed  following labs and imaging studies  CBC: Recent Labs  Lab 05/27/19 0203 05/28/19 0226  WBC 10.2 11.0*  NEUTROABS 6.4 7.3  HGB 10.9* 11.2*  HCT 37.0* 37.4*  MCV 99.2 97.7  PLT 275 315   Basic Metabolic Panel: Recent Labs  Lab 05/27/19 0203 05/28/19 0226 05/29/19 0752 05/30/19 0446 05/31/19 0430  NA 141 139 139 139 138  K 3.5 3.1* 3.4* 3.1* 3.3*  CL 97* 98 94* 95* 95*  CO2 32 31 31 32 33*  GLUCOSE 199* 228* 168* 192* 175*  BUN 22* 20 21* 24* 23*  CREATININE 2.19* 2.02* 2.11* 2.20* 2.05*  CALCIUM 8.8* 8.4* 8.7* 8.6* 8.7*  MG  --   --  2.1 1.8 1.8   GFR: Estimated Creatinine Clearance: 60.1 mL/min (A) (by C-G formula based on SCr of 2.05 mg/dL (H)). Liver Function Tests: No results for input(s): AST, ALT, ALKPHOS, BILITOT, PROT, ALBUMIN in the last 168 hours. No results for input(s): LIPASE, AMYLASE in the last 168 hours. No results for input(s): AMMONIA in the last 168 hours. Coagulation Profile: No results for input(s): INR, PROTIME in the last 168 hours. Cardiac Enzymes: No results for input(s): CKTOTAL, CKMB, CKMBINDEX, TROPONINI in the last 168 hours. BNP (last 3 results) No results for input(s): PROBNP in the last 8760 hours. HbA1C: No results for input(s): HGBA1C in the last 72 hours. CBG: Recent Labs  Lab 05/30/19 1214 05/30/19 1656 05/30/19 2015 05/30/19 2340 05/31/19 0318  GLUCAP 197* 175* 167* 175* 171*   Lipid Profile: No results for input(s): CHOL, HDL, LDLCALC, TRIG, CHOLHDL, LDLDIRECT in the last 72 hours. Thyroid Function Tests: No results for input(s): TSH, T4TOTAL, FREET4, T3FREE, THYROIDAB in the last 72 hours. Anemia Panel: No results for input(s): VITAMINB12, FOLATE, FERRITIN, TIBC, IRON, RETICCTPCT in the last 72 hours. Sepsis Labs: No results for input(s): PROCALCITON, LATICACIDVEN in the last 168 hours.  No results found for this or any previous visit (from the past 240 hour(s)).       Radiology Studies: No results found.       Scheduled Meds: . amiodarone  100 mg Oral Daily  . apixaban  5 mg Oral BID  . artificial tears   Both Eyes QHS  . budesonide (PULMICORT) nebulizer solution  0.25 mg Nebulization BID  . Chlorhexidine Gluconate Cloth  6 each Topical Daily  . dextromethorphan-guaiFENesin  2 tablet Oral BID  . hydroxypropyl methylcellulose / hypromellose  1 drop Both Eyes TID  . insulin aspart  0-15 Units Subcutaneous Q4H  . insulin glargine  20 Units Subcutaneous Daily  . ipratropium  0.5 mg Nebulization BID  . isosorbide mononitrate  30 mg Oral QHS  . levalbuterol  0.63 mg Nebulization BID  . mouth rinse  15 mL Mouth Rinse BID  . pantoprazole  40 mg Oral QHS  . Ensure Max Protein  11 oz Oral Daily  . rosuvastatin  20 mg Oral q1800  . senna-docusate  1 tablet Oral BID  . sodium chloride flush  10-40 mL Intracatheter Q12H  . topiramate  100 mg Oral QHS  . torsemide  80 mg Oral BID   Continuous Infusions:  Aline August, MD [facility] 05/31/2019, 7:23 AM

## 2019-05-31 NOTE — Progress Notes (Signed)
Pt stated he would place self on unit when ready for bed.

## 2019-05-31 NOTE — Progress Notes (Signed)
Pt able to place self on and off the Trilogy.

## 2019-05-31 NOTE — Progress Notes (Signed)
Patient states that he will self place his Trilogy device at bedtime, informed patient to have his RN call if he should need assistance.

## 2019-06-01 LAB — BASIC METABOLIC PANEL
Anion gap: 11 (ref 5–15)
BUN: 25 mg/dL — ABNORMAL HIGH (ref 6–20)
CO2: 30 mmol/L (ref 22–32)
Calcium: 8.8 mg/dL — ABNORMAL LOW (ref 8.9–10.3)
Chloride: 96 mmol/L — ABNORMAL LOW (ref 98–111)
Creatinine, Ser: 1.95 mg/dL — ABNORMAL HIGH (ref 0.61–1.24)
GFR calc Af Amer: 43 mL/min — ABNORMAL LOW (ref >60)
GFR calc non Af Amer: 37 mL/min — ABNORMAL LOW (ref >60)
Glucose, Bld: 205 mg/dL — ABNORMAL HIGH (ref 70–99)
Potassium: 3.4 mmol/L — ABNORMAL LOW (ref 3.5–5.1)
Sodium: 137 mmol/L (ref 135–145)

## 2019-06-01 LAB — GLUCOSE, CAPILLARY
Glucose-Capillary: 205 mg/dL — ABNORMAL HIGH (ref 70–99)
Glucose-Capillary: 215 mg/dL — ABNORMAL HIGH (ref 70–99)
Glucose-Capillary: 297 mg/dL — ABNORMAL HIGH (ref 70–99)
Glucose-Capillary: 243 mg/dL — ABNORMAL HIGH (ref 70–99)
Glucose-Capillary: 176 mg/dL — ABNORMAL HIGH (ref 70–99)
Glucose-Capillary: 201 mg/dL — ABNORMAL HIGH (ref 70–99)

## 2019-06-01 LAB — MAGNESIUM: Magnesium: 1.9 mg/dL (ref 1.7–2.4)

## 2019-06-01 MED ORDER — POTASSIUM CHLORIDE CRYS ER 20 MEQ PO TBCR
40.0000 meq | EXTENDED_RELEASE_TABLET | Freq: Once | ORAL | Status: AC
  Administered 2019-06-01: 40 meq via ORAL
  Filled 2019-06-01: qty 2

## 2019-06-01 NOTE — Progress Notes (Signed)
Patient ID: [name]., male   DOB: Feb 21, 1961, 58 y.o.   MRN: [id number]  PROGRESS NOTE    [name].  [id number] DOB: [birth year] DOA: 05/12/2019 PCP: Caren Macadam, MD   Brief Narrative:  58 year old male with history of obesity, OHS, OSA, chronic respiratory failure with hypoxia and hypercapnia on 2 to 3 L oxygen at home, noncompliant with CPAP, diabetes mellitus type 2 on insulin, chronic A. fib, CAD, failed CABG in 2014, nonspecific CVA, CKD stage III, chronic diastolic heart failure presented with worsening shortness of breath and was found to be more hypoxic and hypercapnic requiring endotracheal intubation.  During the hospitalization, he was extubated and transferred to the floor.  Palliative care was consulted.  CODE STATUS was changed to DNR.  Awaiting SNF placement.  Assessment & Plan:   Acute on chronic hypercapnic and hypoxic respiratory failure Acute on chronic diastolic heart failure OSA/OHS, noncompliance to CPAP -Patient had to be intubated on admission and subsequently has been extubated.  Overall prognosis is very poor. -Palliative care is following. -Hospice discussions were also started but wife wants to pursue rehab options.  Outpatient rehab will not accept the patient on trilogy vent.  Patient apparently not so compliant with trilogy vent or CPAP at home. -If condition worsens, consider full comfort measures. -Patient has been switched to DNR already.  He has had 2 intubations in a month. -Echo on 02/22/2019 had shown EF of 55 to 60%.  Continue home torsemide dose of 80 mg twice a day.  Continue isosorbide mononitrate. -Strict input and output.  Daily weights.  Fluid restriction. -Still using Trilogy vent.  Advised the patient as well as the wife on phone regarding the need to use BiPAP during the daytime as needed if he is sleepy and during nighttime.  This may help with rehab options.  Hypokalemia -Replace.  Repeat a.m. labs    History of chronic A. Fib -Continue Eliquis and amiodarone.  Rate controlled.  Chronic kidney disease stage III -Baseline creatinine 2.6-2.7.  Monitor creatinine.  Creatinine 1.95 today.  Diabetes mellitus type II--continue CBGs with SSI.  Chronic lower extremity wounds: Present on admission next -continue local wound care  Morbid obesity  -outpatient follow-up  Generalized conditioning Overall very poor prognosis -Education officer, museum following for SNF placement.  If condition worsens, consider hospice/comfort measures.   DVT prophylaxis: Eliquis Code Status: DNR Family Communication: Called wife/[name] Lerette on phone on 05/31/2019 Disposition Plan: SNF once bed is available  Consultants: PCCM/palliative care  Procedures: Intubation/extubation  Antimicrobials:  None  Subjective: Patient seen and examined at bedside.  He is awake, very poor historian.  No worsening shortness of breath or chest pain.  No overnight fever or vomiting. Objective: Vitals:   05/31/19 2004 05/31/19 2006 05/31/19 2051 05/31/19 2307  BP:    139/76  Pulse: 87   (!) 105  Resp: 18   20  Temp:    98.3 F (36.8 C)  TempSrc:    Oral  SpO2: 97% 95% 95% 99%  Weight:      Height:        Intake/Output Summary (Last 24 hours) at 06/01/2019 0739 Last data filed at 06/01/2019 0414 Gross per 24 hour  Intake -  Output 3750 ml  Net -3750 ml   Filed Weights   05/28/19 0500 05/29/19 0312 05/30/19 0421  Weight: (!) 163.7 kg (!) 165.1 kg (!) 157.4 kg    Examination:  General exam: No distress.  Looks older than  stated age.  Very poor historian. Respiratory system: Bilateral decreased breath sounds at bases with bibasilar crackles.  No wheezing  cardiovascular system: S1-S2 heard, rate controlled Gastrointestinal system: Abdomen is morbidly obese, nondistended, soft and nontender. Normal bowel sounds heard. Extremities: No cyanosis; pitting lower extremity edema present  Data Reviewed: I have  personally reviewed following labs and imaging studies  CBC: Recent Labs  Lab 05/27/19 0203 05/28/19 0226  WBC 10.2 11.0*  NEUTROABS 6.4 7.3  HGB 10.9* 11.2*  HCT 37.0* 37.4*  MCV 99.2 97.7  PLT 275 102   Basic Metabolic Panel: Recent Labs  Lab 05/28/19 0226 05/29/19 0752 05/30/19 0446 05/31/19 0430 06/01/19 0557  NA 139 139 139 138 137  K 3.1* 3.4* 3.1* 3.3* 3.4*  CL 98 94* 95* 95* 96*  CO2 31 31 32 33* 30  GLUCOSE 228* 168* 192* 175* 205*  BUN 20 21* 24* 23* 25*  CREATININE 2.02* 2.11* 2.20* 2.05* 1.95*  CALCIUM 8.4* 8.7* 8.6* 8.7* 8.8*  MG  --  2.1 1.8 1.8 1.9   GFR: Estimated Creatinine Clearance: 63.1 mL/min (A) (by C-G formula based on SCr of 1.95 mg/dL (H)). Liver Function Tests: No results for input(s): AST, ALT, ALKPHOS, BILITOT, PROT, ALBUMIN in the last 168 hours. No results for input(s): LIPASE, AMYLASE in the last 168 hours. No results for input(s): AMMONIA in the last 168 hours. Coagulation Profile: No results for input(s): INR, PROTIME in the last 168 hours. Cardiac Enzymes: No results for input(s): CKTOTAL, CKMB, CKMBINDEX, TROPONINI in the last 168 hours. BNP (last 3 results) No results for input(s): PROBNP in the last 8760 hours. HbA1C: No results for input(s): HGBA1C in the last 72 hours. CBG: Recent Labs  Lab 05/31/19 1153 05/31/19 1623 05/31/19 1945 05/31/19 2302 06/01/19 0358  GLUCAP 228* 169* 179* 197* 205*   Lipid Profile: No results for input(s): CHOL, HDL, LDLCALC, TRIG, CHOLHDL, LDLDIRECT in the last 72 hours. Thyroid Function Tests: No results for input(s): TSH, T4TOTAL, FREET4, T3FREE, THYROIDAB in the last 72 hours. Anemia Panel: No results for input(s): VITAMINB12, FOLATE, FERRITIN, TIBC, IRON, RETICCTPCT in the last 72 hours. Sepsis Labs: No results for input(s): PROCALCITON, LATICACIDVEN in the last 168 hours.  No results found for this or any previous visit (from the past 240 hour(s)).       Radiology Studies:  No results found.      Scheduled Meds: . amiodarone  100 mg Oral Daily  . apixaban  5 mg Oral BID  . artificial tears   Both Eyes QHS  . budesonide (PULMICORT) nebulizer solution  0.25 mg Nebulization BID  . Chlorhexidine Gluconate Cloth  6 each Topical Daily  . dextromethorphan-guaiFENesin  2 tablet Oral BID  . hydroxypropyl methylcellulose / hypromellose  1 drop Both Eyes TID  . insulin aspart  0-15 Units Subcutaneous Q4H  . insulin glargine  20 Units Subcutaneous Daily  . ipratropium  0.5 mg Nebulization BID  . isosorbide mononitrate  30 mg Oral QHS  . levalbuterol  0.63 mg Nebulization BID  . mouth rinse  15 mL Mouth Rinse BID  . pantoprazole  40 mg Oral QHS  . Ensure Max Protein  11 oz Oral Daily  . rosuvastatin  20 mg Oral q1800  . senna-docusate  1 tablet Oral BID  . sodium chloride flush  10-40 mL Intracatheter Q12H  . topiramate  100 mg Oral QHS  . torsemide  80 mg Oral BID   Continuous Infusions:  Aline August, MD [facility] 06/01/2019, 7:39 AM

## 2019-06-01 NOTE — Plan of Care (Signed)
  Problem: Education: Goal: Knowledge of General Education information will improve Description: Including pain rating scale, medication(s)/side effects and non-pharmacologic comfort measures Outcome: Progressing   Problem: Health Behavior/Discharge Planning: Goal: Ability to manage health-related needs will improve Outcome: Progressing   Problem: Clinical Measurements: Goal: Ability to maintain clinical measurements within normal limits will improve Outcome: Progressing Goal: Will remain free from infection Outcome: Progressing Goal: Diagnostic test results will improve Outcome: Progressing Goal: Respiratory complications will improve Outcome: Progressing Goal: Cardiovascular complication will be avoided Outcome: Progressing   Problem: Skin Integrity: Goal: Risk for impaired skin integrity will decrease Outcome: Progressing   Problem: Activity: Goal: Ability to tolerate increased activity will improve Outcome: Progressing   Problem: Respiratory: Goal: Ability to maintain a clear airway and adequate ventilation will improve Outcome: Progressing

## 2019-06-02 LAB — BASIC METABOLIC PANEL
Anion gap: 10 (ref 5–15)
BUN: 24 mg/dL — ABNORMAL HIGH (ref 6–20)
CO2: 32 mmol/L (ref 22–32)
Calcium: 8.7 mg/dL — ABNORMAL LOW (ref 8.9–10.3)
Chloride: 97 mmol/L — ABNORMAL LOW (ref 98–111)
Creatinine, Ser: 2.15 mg/dL — ABNORMAL HIGH (ref 0.61–1.24)
GFR calc Af Amer: 38 mL/min — ABNORMAL LOW (ref >60)
GFR calc non Af Amer: 33 mL/min — ABNORMAL LOW (ref >60)
Glucose, Bld: 193 mg/dL — ABNORMAL HIGH (ref 70–99)
Potassium: 3.5 mmol/L (ref 3.5–5.1)
Sodium: 139 mmol/L (ref 135–145)

## 2019-06-02 LAB — GLUCOSE, CAPILLARY
Glucose-Capillary: 197 mg/dL — ABNORMAL HIGH (ref 70–99)
Glucose-Capillary: 181 mg/dL — ABNORMAL HIGH (ref 70–99)
Glucose-Capillary: 289 mg/dL — ABNORMAL HIGH (ref 70–99)
Glucose-Capillary: 211 mg/dL — ABNORMAL HIGH (ref 70–99)
Glucose-Capillary: 206 mg/dL — ABNORMAL HIGH (ref 70–99)
Glucose-Capillary: 252 mg/dL — ABNORMAL HIGH (ref 70–99)

## 2019-06-02 LAB — MAGNESIUM: Magnesium: 1.9 mg/dL (ref 1.7–2.4)

## 2019-06-02 NOTE — Progress Notes (Signed)
Physical Therapy Treatment Patient Details Name: [name]. MRN: [id number] DOB: 1961/05/03 Today's Date: 06/02/2019    History of Present Illness Pt is a 58 yo male admitted with worsening SOB, found to hypercarbic and hypoxic. ETT 9/21/9/23. PMH OHS, OSA, chronic respiratory failure with hypoxia and hypercapnia, on baseline 2 to 3 L/min home oxygen, noncompliant with CPAP IDDM, Afib, CAD, failed CABG in 2014, CVA, CKD stage 3-4, and diastolic HF    PT Comments    Pt eager to get out of bed today, complaining that he does not get out of bed every day. Pt wants to be up in chair when his wife comes. Pt requires min guard for bed mobility, transfers and ambulation of 3 feet to bariatric recliner. Pt maintains SaO2 >90%O2 with 3L O2 via [place] throughout mobility but has 2/4 DoE with movement. D/c plans remain appropriate at this time. PT will follow acutely.     Follow Up Recommendations  SNF;Supervision/Assistance - 24 hour     Equipment Recommendations  None recommended by PT    Recommendations for Other Services       Precautions / Restrictions Precautions Precautions: Fall Restrictions Weight Bearing Restrictions: No    Mobility  Bed Mobility Overal bed mobility: Needs Assistance Bed Mobility: Supine to Sit     Supine to sit: Min guard     General bed mobility comments: heavy use of rail to pull to EoB  Transfers Overall transfer level: Needs assistance Equipment used: Rolling walker (2 wheeled)(bariatric RW) Transfers: Sit to/from Stand Sit to Stand: Min guard         General transfer comment: minG from bed to power up to chair   Ambulation/Gait Ambulation/Gait assistance: Min guard Gait Distance (Feet): 3 Feet Assistive device: Rolling walker (2 wheeled) Gait Pattern/deviations: Step-to pattern;Shuffle;Wide base of support Gait velocity: decreased Gait velocity interpretation: <1.31 ft/sec, indicative of household ambulator General Gait  Details: pt with widened BOS and short shuffling steps. RW held anteriorly with forward trunk lean.         Balance Overall balance assessment: Needs assistance Sitting-balance support: Single extremity supported;Feet supported Sitting balance-Leahy Scale: Fair Sitting balance - Comments: supervision for sitting balance   Standing balance support: Bilateral upper extremity supported Standing balance-Leahy Scale: Fair Standing balance comment: close supervision with BUE support on RW                            Cognition Arousal/Alertness: Awake/alert Behavior During Therapy: WFL for tasks assessed/performed Overall Cognitive Status: History of cognitive impairments - at baseline                                 General Comments: decreased safety awareness         General Comments General comments (skin integrity, edema, etc.): Pt with bilateral feet wrapped, placed socks on for mobility in taking them off feet noted to be purple in dependent position, elevated to improve blood flow      Pertinent Vitals/Pain Pain Assessment: No/denies pain           PT Goals (current goals can now be found in the care plan section) Acute Rehab PT Goals Patient Stated Goal: get stronger and get home PT Goal Formulation: With patient Time For Goal Achievement: 06/04/19 Potential to Achieve Goals: Fair Progress towards PT goals: Progressing toward goals    Frequency  Min 3X/week      PT Plan Current plan remains appropriate       AM-PAC PT "6 Clicks" Mobility   Outcome Measure  Help needed turning from your back to your side while in a flat bed without using bedrails?: None Help needed moving from lying on your back to sitting on the side of a flat bed without using bedrails?: A Little Help needed moving to and from a bed to a chair (including a wheelchair)?: A Little Help needed standing up from a chair using your arms (e.g., wheelchair or bedside  chair)?: A Little Help needed to walk in hospital room?: A Little Help needed climbing 3-5 steps with a railing? : A Lot 6 Click Score: 18    End of Session Equipment Utilized During Treatment: Gait belt;Oxygen Activity Tolerance: Patient tolerated treatment well Patient left: in chair;with call bell/phone within reach;with family/visitor present Nurse Communication: Mobility status PT Visit Diagnosis: Muscle weakness (generalized) (M62.81);Difficulty in walking, not elsewhere classified (R26.2);Unsteadiness on feet (R26.81)     Time: 0630-1601 PT Time Calculation (min) (ACUTE ONLY): 13 min  Charges:  $Gait Training: 8-22 mins                     Astrid Vides B. Migdalia Dk PT, DPT Acute Rehabilitation Services Pager [phone] Office [phone]    [place] 06/02/2019, 4:27 PM

## 2019-06-02 NOTE — Progress Notes (Signed)
Patient ID: [name]., male   DOB: [birth year], 58 y.o.   MRN: [id number]  PROGRESS NOTE    [name].  RKY:[id number] DOB: 1960-08-24 DOA: 05/12/2019 PCP: Caren Macadam, MD   Brief Narrative:  58 year old male with history of obesity, OHS, OSA, chronic respiratory failure with hypoxia and hypercapnia on 2 to 3 L oxygen at home, noncompliant with CPAP, diabetes mellitus type 2 on insulin, chronic A. fib, CAD, failed CABG in 2014, nonspecific CVA, CKD stage III, chronic diastolic heart failure presented with worsening shortness of breath and was found to be more hypoxic and hypercapnic requiring endotracheal intubation.  During the hospitalization, he was extubated and transferred to the floor.  Palliative care was consulted.  CODE STATUS was changed to DNR.  Awaiting SNF placement.  Assessment & Plan:   Acute on chronic hypercapnic and hypoxic respiratory failure Acute on chronic diastolic heart failure OSA/OHS, noncompliance to CPAP -Patient had to be intubated on admission and subsequently has been extubated.  Overall prognosis is very poor. -Palliative care is following. -Hospice discussions were also started but wife wants to pursue rehab options.  Outpatient rehab will not accept the patient on trilogy vent.  Patient apparently not so compliant with trilogy vent or CPAP at home. -If condition worsens, consider full comfort measures. -Patient has been switched to DNR already.  He has had 2 intubations in a month. -Echo on 02/22/2019 had shown EF of 55 to 60%.  Continue home torsemide dose of 80 mg twice a day.  Continue isosorbide mononitrate. -Strict input and output.  Daily weights.  Fluid restriction. - Advised the patient as well as the wife on phone regarding the need to use BiPAP during the daytime as needed if he is sleepy and during nighttime.  This may help with rehab options.  Patient states that he used BiPAP last night.  Hypokalemia -Improved.  History of chronic A. Fib -Continue Eliquis and amiodarone.  Rate controlled.  Chronic kidney disease stage III -Baseline creatinine 2.6-2.7.  Monitor creatinine.  Creatinine 2.15 today.  Diabetes mellitus type II--continue CBGs with SSI.  Chronic lower extremity wounds: Present on admission next -continue local wound care  Morbid obesity  -outpatient follow-up  Generalized conditioning Overall very poor prognosis -Education officer, museum following for SNF placement.  If condition worsens, consider hospice/comfort measures.   DVT prophylaxis: Eliquis Code Status: DNR Family Communication: Called wife/[name] Zani on phone on 05/31/2019 Disposition Plan: SNF once bed is available  Consultants: PCCM/palliative care  Procedures: Intubation/extubation  Antimicrobials:  None  Subjective: Patient seen and examined at bedside.  He is awake, very poor historian.  States that he used BiPAP last night.  Denies any worsening chest pain, fever, shortness of breath.   Objective: Vitals:   06/01/19 1940 06/01/19 1942 06/01/19 2300 06/02/19 0035  BP:   (!) 112/58   Pulse: 91  91 93  Resp: 18  18 16   Temp:   97.8 F (36.6 C)   TempSrc:   Oral   SpO2: 98% 99% 98% 96%  Weight:      Height:       No intake or output data in the 24 hours ending 06/02/19 0732 Filed Weights   05/28/19 0500 05/29/19 0312 05/30/19 0421  Weight: (!) 163.7 kg (!) 165.1 kg (!) 157.4 kg    Examination:  General exam: No acute distress.  Looks older than stated age.  Poor historian.  Respiratory system: Bilateral decreased breath sounds at bases with  basilar crackles.   Cardiovascular system: Rate controlled, S1-S2 heard Gastrointestinal system: Abdomen is morbidly obese, nondistended, soft and nontender. Normal bowel sounds heard. Extremities: No cyanosis; pitting lower extremity edema present  Data Reviewed: I have personally reviewed following labs and imaging studies  CBC: Recent Labs  Lab 05/27/19  0203 05/28/19 0226  WBC 10.2 11.0*  NEUTROABS 6.4 7.3  HGB 10.9* 11.2*  HCT 37.0* 37.4*  MCV 99.2 97.7  PLT 275 876   Basic Metabolic Panel: Recent Labs  Lab 05/29/19 0752 05/30/19 0446 05/31/19 0430 06/01/19 0557 06/02/19 0545  NA 139 139 138 137 139  K 3.4* 3.1* 3.3* 3.4* 3.5  CL 94* 95* 95* 96* 97*  CO2 31 32 33* 30 32  GLUCOSE 168* 192* 175* 205* 193*  BUN 21* 24* 23* 25* 24*  CREATININE 2.11* 2.20* 2.05* 1.95* 2.15*  CALCIUM 8.7* 8.6* 8.7* 8.8* 8.7*  MG 2.1 1.8 1.8 1.9 1.9   GFR: Estimated Creatinine Clearance: 57.3 mL/min (A) (by C-G formula based on SCr of 2.15 mg/dL (H)). Liver Function Tests: No results for input(s): AST, ALT, ALKPHOS, BILITOT, PROT, ALBUMIN in the last 168 hours. No results for input(s): LIPASE, AMYLASE in the last 168 hours. No results for input(s): AMMONIA in the last 168 hours. Coagulation Profile: No results for input(s): INR, PROTIME in the last 168 hours. Cardiac Enzymes: No results for input(s): CKTOTAL, CKMB, CKMBINDEX, TROPONINI in the last 168 hours. BNP (last 3 results) No results for input(s): PROBNP in the last 8760 hours. HbA1C: No results for input(s): HGBA1C in the last 72 hours. CBG: Recent Labs  Lab 06/01/19 1200 06/01/19 1606 06/01/19 1927 06/01/19 2312 06/02/19 0314  GLUCAP 297* 243* 176* 201* 197*   Lipid Profile: No results for input(s): CHOL, HDL, LDLCALC, TRIG, CHOLHDL, LDLDIRECT in the last 72 hours. Thyroid Function Tests: No results for input(s): TSH, T4TOTAL, FREET4, T3FREE, THYROIDAB in the last 72 hours. Anemia Panel: No results for input(s): VITAMINB12, FOLATE, FERRITIN, TIBC, IRON, RETICCTPCT in the last 72 hours. Sepsis Labs: No results for input(s): PROCALCITON, LATICACIDVEN in the last 168 hours.  No results found for this or any previous visit (from the past 240 hour(s)).       Radiology Studies: No results found.      Scheduled Meds: . amiodarone  100 mg Oral Daily  . apixaban   5 mg Oral BID  . artificial tears   Both Eyes QHS  . budesonide (PULMICORT) nebulizer solution  0.25 mg Nebulization BID  . Chlorhexidine Gluconate Cloth  6 each Topical Daily  . dextromethorphan-guaiFENesin  2 tablet Oral BID  . hydroxypropyl methylcellulose / hypromellose  1 drop Both Eyes TID  . insulin aspart  0-15 Units Subcutaneous Q4H  . insulin glargine  20 Units Subcutaneous Daily  . ipratropium  0.5 mg Nebulization BID  . isosorbide mononitrate  30 mg Oral QHS  . levalbuterol  0.63 mg Nebulization BID  . mouth rinse  15 mL Mouth Rinse BID  . pantoprazole  40 mg Oral QHS  . Ensure Max Protein  11 oz Oral Daily  . rosuvastatin  20 mg Oral q1800  . senna-docusate  1 tablet Oral BID  . sodium chloride flush  10-40 mL Intracatheter Q12H  . topiramate  100 mg Oral QHS  . torsemide  80 mg Oral BID   Continuous Infusions:        Aline August, MD [facility] 06/02/2019, 7:32 AM

## 2019-06-02 NOTE — Plan of Care (Signed)
  Problem: Education: Goal: Knowledge of General Education information will improve Description: Including pain rating scale, medication(s)/side effects and non-pharmacologic comfort measures Outcome: Progressing   Problem: Health Behavior/Discharge Planning: Goal: Ability to manage health-related needs will improve Outcome: Progressing   Problem: Clinical Measurements: Goal: Ability to maintain clinical measurements within normal limits will improve Outcome: Progressing Goal: Will remain free from infection Outcome: Progressing Goal: Diagnostic test results will improve Outcome: Progressing Goal: Respiratory complications will improve Outcome: Progressing Goal: Cardiovascular complication will be avoided Outcome: Progressing   Problem: Activity: Goal: Risk for activity intolerance will decrease Outcome: Progressing   Problem: Nutrition: Goal: Adequate nutrition will be maintained Outcome: Progressing   Problem: Coping: Goal: Level of anxiety will decrease Outcome: Progressing   Problem: Pain Managment: Goal: General experience of comfort will improve Outcome: Progressing   Problem: Elimination: Goal: Will not experience complications related to bowel motility Outcome: Progressing Goal: Will not experience complications related to urinary retention Outcome: Progressing   Problem: Safety: Goal: Ability to remain free from injury will improve Outcome: Progressing

## 2019-06-03 LAB — SARS CORONAVIRUS 2 BY RT PCR (HOSPITAL ORDER, PERFORMED IN ~~LOC~~ HOSPITAL LAB): SARS Coronavirus 2: NEGATIVE

## 2019-06-03 LAB — GLUCOSE, CAPILLARY
Glucose-Capillary: 189 mg/dL — ABNORMAL HIGH (ref 70–99)
Glucose-Capillary: 228 mg/dL — ABNORMAL HIGH (ref 70–99)
Glucose-Capillary: 223 mg/dL — ABNORMAL HIGH (ref 70–99)
Glucose-Capillary: 255 mg/dL — ABNORMAL HIGH (ref 70–99)
Glucose-Capillary: 227 mg/dL — ABNORMAL HIGH (ref 70–99)
Glucose-Capillary: 176 mg/dL — ABNORMAL HIGH (ref 70–99)

## 2019-06-03 LAB — BASIC METABOLIC PANEL
Anion gap: 14 (ref 5–15)
BUN: 28 mg/dL — ABNORMAL HIGH (ref 6–20)
CO2: 29 mmol/L (ref 22–32)
Calcium: 8.9 mg/dL (ref 8.9–10.3)
Chloride: 94 mmol/L — ABNORMAL LOW (ref 98–111)
Creatinine, Ser: 2.35 mg/dL — ABNORMAL HIGH (ref 0.61–1.24)
GFR calc Af Amer: 34 mL/min — ABNORMAL LOW (ref >60)
GFR calc non Af Amer: 29 mL/min — ABNORMAL LOW (ref >60)
Glucose, Bld: 223 mg/dL — ABNORMAL HIGH (ref 70–99)
Potassium: 3.1 mmol/L — ABNORMAL LOW (ref 3.5–5.1)
Sodium: 137 mmol/L (ref 135–145)

## 2019-06-03 LAB — MAGNESIUM: Magnesium: 2 mg/dL (ref 1.7–2.4)

## 2019-06-03 NOTE — NC FL2 (Signed)
[place] LEVEL OF CARE SCREENING TOOL     IDENTIFICATION  Patient Name: [name]. Birthdate: 15-Sep-1960 Sex: male Admission Date (Current Location): 05/12/2019  [facility] and [place] Number:  Herbalist and Address:  [facility], [place] [address], [place], [place] [address]      Provider Number: [id number]  Attending Physician Name and Address:  Aline August, MD  Relative Name and Phone Number:  [name]), spouse [phone]    Current Level of Care: Hospital Recommended Level of Care: [place] Prior Approval Number:    Date Approved/Denied:   PASRR Number: [id number]  Discharge Plan: SNF    Current Diagnoses: Patient Active Problem List   Diagnosis Date Noted  . Dysphagia   . Goals of care, counseling/discussion   . Palliative care encounter   . CKD (chronic kidney disease), stage IV ([place]) 04/23/2019  . OSA (obstructive sleep apnea)   . Respiratory failure ([place]) 03/22/2019  . Morbid obesity with BMI of 50.0-59.9, adult ([place])   . Acute on chronic diastolic heart failure ([place]) 02/23/2019  . Acute renal failure with acute renal cortical necrosis superimposed on stage 4 chronic kidney disease ([place])   . Acute respiratory failure with hypoxia and hypercapnia ([place]) 02/22/2019  . SOB (shortness of breath) 02/22/2019  . Chronic obstructive pulmonary disease ([place]) 02/22/2019  . CKD stage 3 secondary to diabetes ([place]) 09/14/2018  . IDDM (insulin dependent diabetes mellitus) 09/14/2018  . Chronic diastolic heart failure ([place]) 05/20/2018  . Acute diastolic heart failure ([place])   . Atrial fibrillation with RVR ([place])   . Carbon dioxide retention   . AKI (acute kidney injury) ([place])   . Pressure injury of skin 01/29/2017  . Hereditary and idiopathic peripheral neuropathy 01/08/2017  . Acute on chronic respiratory failure with hypoxia and hypercapnia ([place]) 02/16/2016  . Conjunctivitis 10/18/2015   . 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 Headache due to intracranial disease 10/13/2013  . Edema 12/17/2012  . 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 Status post craniotomy 10/12/2012  . CAD (coronary artery disease) of artery bypass graft 09/09/2012  . 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 Obesity, Class III, BMI 40-49.9 (morbid obesity) ([place]) 09/09/2012    Orientation RESPIRATION BLADDER Height & Weight     Self, Time, Situation, Place  ([place] 4L, BiPAP at night) Incontinent, External catheter Weight: (!) 366 lb (166 kg) Height:  5\' 11"  (180.3 cm)  BEHAVIORAL SYMPTOMS/MOOD NEUROLOGICAL BOWEL NUTRITION STATUS      Incontinent Diet(Please see DC Summary)  AMBULATORY STATUS COMMUNICATION OF NEEDS Skin   Extensive Assist Verbally Other (Comment)(Venous stasis ulcer on leg)                       Personal Care Assistance Level of Assistance  Bathing, Feeding, Dressing Bathing Assistance: Maximum assistance Feeding assistance: Limited assistance Dressing Assistance: Limited assistance     Functional Limitations Info  Sight, Hearing, Speech Sight Info: Impaired Hearing Info: Impaired Speech Info: Adequate    SPECIAL CARE FACTORS FREQUENCY  PT (By licensed PT), OT (By licensed OT)     PT Frequency: 5x OT Frequency: 3x            Contractures Contractures Info: Not present    Additional Factors Info  Code Status, Allergies Code Status Info: DNR Allergies Info: Morphine And Related, Novocain (Procaine), Carbamazepine, Codeine, Gabapentin, Ivp Dye (Iodinated  Diagnostic Agents), Other, Prednisone           Current Medications (06/03/2019):  This is the current hospital active medication list Current Facility-Administered Medications  Medication Dose Route Frequency Provider Last Rate Last Dose  . acetaminophen (TYLENOL) tablet 650 mg  650 mg Oral Q4H PRN Regalado, Belkys A, MD      . amiodarone (PACERONE)  tablet 100 mg  100 mg Oral Daily Regalado, Belkys A, MD   100 mg at 06/03/19 0800  . apixaban (ELIQUIS) tablet 5 mg  5 mg Oral BID Regalado, Belkys A, MD   5 mg at 06/03/19 0800  . artificial tears (LACRILUBE) ophthalmic ointment   Both Eyes QHS Vinie Sill C, NP      . bisacodyl (DULCOLAX) suppository 10 mg  10 mg Rectal Daily PRN Aline August, MD      . budesonide (PULMICORT) nebulizer solution 0.25 mg  0.25 mg Nebulization BID Regalado, Belkys A, MD   0.25 mg at 06/02/19 1951  . Chlorhexidine Gluconate Cloth 2 % PADS 6 each  6 each Topical Daily Regalado, Belkys A, MD   6 each at 06/02/19 1252  . dextromethorphan-guaiFENesin (MUCINEX DM) 30-600 MG per 12 hr tablet 2 tablet  2 tablet Oral BID Alma Friendly, MD   2 tablet at 06/03/19 0804  . HYDROcodone-acetaminophen (NORCO) 10-325 MG per tablet 1 tablet  1 tablet Oral R6E PRN Pershing Proud, NP   1 tablet at 06/03/19 0800  . hydroxypropyl methylcellulose / hypromellose (ISOPTO TEARS / GONIOVISC) 2.5 % ophthalmic solution 1 drop  1 drop Both Eyes TID Pershing Proud, NP   1 drop at 06/03/19 0800  . insulin aspart (novoLOG) injection 0-15 Units  0-15 Units Subcutaneous Q4H Regalado, Belkys A, MD   5 Units at 06/03/19 0757  . insulin glargine (LANTUS) injection 20 Units  20 Units Subcutaneous Daily Aline August, MD   20 Units at 06/02/19 0950  . ipratropium (ATROVENT) nebulizer solution 0.5 mg  0.5 mg Nebulization BID Regalado, Belkys A, MD   0.5 mg at 06/03/19 0838  . isosorbide mononitrate (IMDUR) 24 hr tablet 30 mg  30 mg Oral QHS Alma Friendly, MD   30 mg at 06/02/19 2147  . levalbuterol (XOPENEX) nebulizer solution 0.63 mg  0.63 mg Nebulization BID Regalado, Belkys A, MD   0.63 mg at 06/03/19 0838  . levalbuterol (XOPENEX) nebulizer solution 0.63 mg  0.63 mg Nebulization Q6H PRN Alma Friendly, MD   0.63 mg at 05/23/19 2301  . LORazepam (ATIVAN) injection 0.5 mg  0.5 mg Intravenous Q4H PRN Regalado, Belkys A, MD       . MEDLINE mouth rinse  15 mL Mouth Rinse BID Regalado, Belkys A, MD   15 mL at 06/03/19 0802  . nitroGLYCERIN (NITROSTAT) SL tablet 0.4 mg  0.4 mg Sublingual Q5 min PRN Regalado, Belkys A, MD      . pantoprazole (PROTONIX) EC tablet 40 mg  40 mg Oral QHS Henri Medal, RPH   40 mg at 06/02/19 2147  . polyethylene glycol (MIRALAX / GLYCOLAX) packet 17 g  17 g Oral Daily PRN Alekh, Kshitiz, MD      . protein supplement (ENSURE MAX) liquid  11 oz Oral Daily Alma Friendly, MD   11 oz at 06/03/19 0802  . Resource ThickenUp Clear   Oral PRN Schorr, Rhetta Mura, NP      . rosuvastatin (CRESTOR) tablet 20 mg  20 mg Oral q1800 Regalado, Belkys  A, MD   20 mg at 06/02/19 1744  . senna-docusate (Senokot-S) tablet 1 tablet  1 tablet Oral BID Aline August, MD   1 tablet at 06/03/19 0801  . sodium chloride flush (NS) 0.9 % injection 10-40 mL  10-40 mL Intracatheter Q12H Regalado, Belkys A, MD   10 mL at 06/02/19 0952  . sodium chloride flush (NS) 0.9 % injection 10-40 mL  10-40 mL Intracatheter PRN Regalado, Belkys A, MD   20 mL at 05/19/19 1632  . tiZANidine (ZANAFLEX) tablet 4 mg  4 mg Oral O4C PRN Pershing Proud, NP   4 mg at 05/15/19 1928  . topiramate (TOPAMAX) tablet 100 mg  100 mg Oral QHS Regalado, Belkys A, MD   100 mg at 06/02/19 2149  . torsemide (DEMADEX) tablet 80 mg  80 mg Oral BID Alma Friendly, MD   80 mg at 06/03/19 9507     Discharge Medications: Please see discharge summary for a list of discharge medications.  Relevant Imaging Results:  Relevant Lab Results:   Additional Information SSN: [id number]  Geralynn Ochs, LCSW

## 2019-06-03 NOTE — Progress Notes (Signed)
Patient ID: [name]., male   DOB: 11-04-1960, 58 y.o.   MRN: [id number]  PROGRESS NOTE    [name].  OIZ:[id number] DOB: Oct 23, 1960 DOA: 05/12/2019 PCP: Caren Macadam, MD   Brief Narrative:  58 year old male with history of obesity, OHS, OSA, chronic respiratory failure with hypoxia and hypercapnia on 2 to 3 L oxygen at home, noncompliant with CPAP, diabetes mellitus type 2 on insulin, chronic A. fib, CAD, failed CABG in 2014, nonspecific CVA, CKD stage III, chronic diastolic heart failure presented with worsening shortness of breath and was found to be more hypoxic and hypercapnic requiring endotracheal intubation.  During the hospitalization, he was extubated and transferred to the floor.  Palliative care was consulted.  CODE STATUS was changed to DNR.  Awaiting SNF placement.  Assessment & Plan:   Acute on chronic hypercapnic and hypoxic respiratory failure Acute on chronic diastolic heart failure OSA/OHS, noncompliance to CPAP -Patient had to be intubated on admission and subsequently has been extubated.  Overall prognosis is very poor. -Palliative care is following. -Hospice discussions were also started but wife wants to pursue rehab options.  Outpatient rehab will not accept the patient on trilogy vent.  Patient apparently not so compliant with trilogy vent or CPAP at home. -If condition worsens, consider full comfort measures. -Patient has been switched to DNR already.  He has had 2 intubations in a month. -Echo on 02/22/2019 had shown EF of 55 to 60%.  Continue home torsemide dose of 80 mg twice a day.  Continue isosorbide mononitrate. -Strict input and output.  Daily weights.  Fluid restriction. -Patient has been using BiPAP at night for the last 2 nights.  Hypokalemia -Replace.  Repeat a.m. labs  History of chronic A. Fib -Continue Eliquis and amiodarone.  Rate controlled.  Chronic kidney disease stage III -Baseline creatinine 2.6-2.7.   Monitor creatinine.  Creatinine 2. 2 5 today.  Diabetes mellitus type II uncontrolled with hyperglycemia --blood sugars intermittently increased.  Increase Lantus to 25 units daily.  Continue CBGs with SSI.  Chronic lower extremity wounds: Present on admission next -continue local wound care  Morbid obesity  -outpatient follow-up  Generalized conditioning Overall very poor prognosis -Education officer, museum following for SNF placement.  If condition worsens, consider hospice/comfort measures.   DVT prophylaxis: Eliquis Code Status: DNR Family Communication: Called wife/[name] Kunkler on phone on 05/31/2019 Disposition Plan: SNF once bed is available  Consultants: PCCM/palliative care  Procedures: Intubation/extubation  Antimicrobials:  None  Subjective: Patient seen and examined at bedside.  He is awake, very poor historian.  States that he used BiPAP last night as well.  No overnight fever, chest pain, worsening shortness of breath.   Objective: Vitals:   06/02/19 2304 06/03/19 0343 06/03/19 0803 06/03/19 0838  BP: (!) 139/58  120/72   Pulse: 85  74   Resp: 20  16   Temp: 98.5 F (36.9 C)  97.8 F (36.6 C)   TempSrc:   Oral   SpO2: 99%  99% 98%  Weight:  (!) 166 kg    Height:        Intake/Output Summary (Last 24 hours) at 06/03/2019 0924 Last data filed at 06/02/2019 2359 Gross per 24 hour  Intake -  Output 1100 ml  Net -1100 ml   Filed Weights   05/29/19 0312 05/30/19 0421 06/03/19 0343  Weight: (!) 165.1 kg (!) 157.4 kg (!) 166 kg    Examination:  General exam: No distress.  Looks older  than stated age.  Poor historian.  Respiratory system: Bilateral decreased breath sounds at bases, no wheezing.  Bibasilar crackles present  cardiovascular system: S1-S2 heard, rate controlled Gastrointestinal system: Abdomen is morbidly obese, nondistended, soft and nontender. Normal bowel sounds heard. Extremities: No cyanosis; pitting lower extremity edema present.  Lower  extremity dressing present  Data Reviewed: I have personally reviewed following labs and imaging studies  CBC: Recent Labs  Lab 05/28/19 0226  WBC 11.0*  NEUTROABS 7.3  HGB 11.2*  HCT 37.4*  MCV 97.7  PLT 604   Basic Metabolic Panel: Recent Labs  Lab 05/30/19 0446 05/31/19 0430 06/01/19 0557 06/02/19 0545 06/03/19 0418  NA 139 138 137 139 137  K 3.1* 3.3* 3.4* 3.5 3.1*  CL 95* 95* 96* 97* 94*  CO2 32 33* 30 32 29  GLUCOSE 192* 175* 205* 193* 223*  BUN 24* 23* 25* 24* 28*  CREATININE 2.20* 2.05* 1.95* 2.15* 2.35*  CALCIUM 8.6* 8.7* 8.8* 8.7* 8.9  MG 1.8 1.8 1.9 1.9 2.0   GFR: Estimated Creatinine Clearance: 54.1 mL/min (A) (by C-G formula based on SCr of 2.35 mg/dL (H)). Liver Function Tests: No results for input(s): AST, ALT, ALKPHOS, BILITOT, PROT, ALBUMIN in the last 168 hours. No results for input(s): LIPASE, AMYLASE in the last 168 hours. No results for input(s): AMMONIA in the last 168 hours. Coagulation Profile: No results for input(s): INR, PROTIME in the last 168 hours. Cardiac Enzymes: No results for input(s): CKTOTAL, CKMB, CKMBINDEX, TROPONINI in the last 168 hours. BNP (last 3 results) No results for input(s): PROBNP in the last 8760 hours. HbA1C: No results for input(s): HGBA1C in the last 72 hours. CBG: Recent Labs  Lab 06/02/19 1628 06/02/19 1937 06/02/19 2305 06/03/19 0339 06/03/19 0757  GLUCAP 211* 206* 252* 189* 228*   Lipid Profile: No results for input(s): CHOL, HDL, LDLCALC, TRIG, CHOLHDL, LDLDIRECT in the last 72 hours. Thyroid Function Tests: No results for input(s): TSH, T4TOTAL, FREET4, T3FREE, THYROIDAB in the last 72 hours. Anemia Panel: No results for input(s): VITAMINB12, FOLATE, FERRITIN, TIBC, IRON, RETICCTPCT in the last 72 hours. Sepsis Labs: No results for input(s): PROCALCITON, LATICACIDVEN in the last 168 hours.  No results found for this or any previous visit (from the past 240 hour(s)).       Radiology  Studies: No results found.      Scheduled Meds: . amiodarone  100 mg Oral Daily  . apixaban  5 mg Oral BID  . artificial tears   Both Eyes QHS  . budesonide (PULMICORT) nebulizer solution  0.25 mg Nebulization BID  . Chlorhexidine Gluconate Cloth  6 each Topical Daily  . dextromethorphan-guaiFENesin  2 tablet Oral BID  . hydroxypropyl methylcellulose / hypromellose  1 drop Both Eyes TID  . insulin aspart  0-15 Units Subcutaneous Q4H  . insulin glargine  20 Units Subcutaneous Daily  . ipratropium  0.5 mg Nebulization BID  . isosorbide mononitrate  30 mg Oral QHS  . levalbuterol  0.63 mg Nebulization BID  . mouth rinse  15 mL Mouth Rinse BID  . pantoprazole  40 mg Oral QHS  . Ensure Max Protein  11 oz Oral Daily  . rosuvastatin  20 mg Oral q1800  . senna-docusate  1 tablet Oral BID  . sodium chloride flush  10-40 mL Intracatheter Q12H  . topiramate  100 mg Oral QHS  . torsemide  80 mg Oral BID   Continuous Infusions:        Defne Gerling Starla Link,  MD [facility] 06/03/2019, 9:24 AM

## 2019-06-03 NOTE — Plan of Care (Signed)
  Problem: Nutrition: Goal: Adequate nutrition will be maintained Outcome: Progressing   Problem: Coping: Goal: Level of anxiety will decrease Outcome: Progressing   Problem: Pain Managment: Goal: General experience of comfort will improve Outcome: Progressing   Problem: Education: Goal: Knowledge of General Education information will improve Description: Including pain rating scale, medication(s)/side effects and non-pharmacologic comfort measures Outcome: Adequate for Discharge   Problem: Clinical Measurements: Goal: Respiratory complications will improve Outcome: Adequate for Discharge

## 2019-06-03 NOTE — TOC Progression Note (Signed)
Transition of Care (TOC) - Progression Note    Patient Details  Name: [name]. MRN: [id number] Date of Birth: 01-14-1961  Transition of Care [facility]) CM/SW [place], [place] Phone Number: 06/03/2019, 10:13 AM  Clinical Narrative:  CSW noting per chart review that patient tolerated BiPAP two nights in a row. CSW updated FL2 and sent to [facility] for review. They will review and update if they are able to offer a bed for the patient. Patient has no bed offers at this time. CSW to follow.     Expected Discharge Plan: [place] Barriers to Discharge: SNF Pending bed offer, Insurance Authorization, Other (comment)(COVID test)  Expected Discharge Plan and Services Expected Discharge Plan: [place]   Discharge Planning Services: CM Consult Post Acute Care Choice: Durable Medical Equipment, Home Health Living arrangements for the past 2 months: Single Family Home                                       Social Determinants of Health (SDOH) Interventions    Readmission Risk Interventions Readmission Risk Prevention Plan 04/24/2019 04/18/2019 02/24/2019  Transportation Screening Complete Complete Complete  PCP or Specialist Appt within 3-5 Days - - Not Complete  Not Complete comments - - see above  [place] or [place] - - Complete  Social Work Consult for [place] Planning/Counseling - - Complete  Palliative Care Screening - - Not Applicable  Medication Review Press photographer) Complete Complete Complete  PCP or Specialist appointment within 3-5 days of discharge (No Data) Patient refused -  [place] or Home Care Consult Complete - -  Palliative Care Screening Complete - -  [place] Not Applicable - -  Some recent data might be hidden

## 2019-06-03 NOTE — TOC Progression Note (Signed)
Transition of Care (TOC) - Progression Note    Patient Details  Name: [name]. MRN: [id number] Date of Birth: 1961/05/04  Transition of Care [facility]) CM/SW [place], [place] Phone Number: 06/03/2019, 1:44 PM  Clinical Narrative:   [facility] has offered the patient a bed, asking about BiPAP settings. CSW sent settings to [facility], they are having to order a machine for the patient. Machine delivery will take 2 days. CSW contacted [employer] to initiate insurance authorization request. CSW contacted patient's wife, Bryna Colander, to update her that [facility] has offered a bed and we are awaiting BiPAP delivery to SNF. CSW also updated MD with barriers to discharge. CSW to follow.    Expected Discharge Plan: Skilled Nursing Facility Barriers to Discharge: Continued Medical Work up, Equipment Delay, Orthoptist and Services Expected Discharge Plan: [place]   Discharge Planning Services: CM Consult Post Acute Care Choice: Museum/gallery conservator, Home Health Living arrangements for the past 2 months: Single Family Home                                       Social Determinants of Health (SDOH) Interventions    Readmission Risk Interventions Readmission Risk Prevention Plan 04/24/2019 04/18/2019 02/24/2019  Transportation Screening Complete Complete Complete  PCP or Specialist Appt within 3-5 Days - - Not Complete  Not Complete comments - - see above  [place] or [place] - - Complete  Social Work Consult for [place] Planning/Counseling - - Complete  Palliative Care Screening - - Not Applicable  Medication Review Press photographer) Complete Complete Complete  PCP or Specialist appointment within 3-5 days of discharge (No Data) Patient refused -  [place] or Home Care Consult Complete - -  Palliative Care Screening Complete - -  [place] Not Applicable - -  Some recent data  might be hidden

## 2019-06-03 NOTE — Progress Notes (Signed)
Nutrition Follow-up  DOCUMENTATION CODES:   Morbid obesity  INTERVENTION:   -ContinueMagic cup TID with meals, each supplement provides 290 kcal and 9 grams of protein -Continue Ensure Max podaily, each supplement provides 150 kcal and 30 grams of protein  NUTRITION DIAGNOSIS:   Increased nutrient needs related to acute illness as evidenced by estimated needs.  Ongoing.  GOAL:   Patient will meet greater than or equal to 90% of their needs  Progressing.  MONITOR:   PO intake, Supplement acceptance, Diet advancement, Labs, Weight trends, Skin, I & O's  ASSESSMENT:   Patient with PMH significant for OHS, OSA, chronic respiratory failure, DM, CAD, failed CABG 2014, CVA, CKD III-IV, and CHF. Presents this admission with respiratory failure likely related to acute compensation of CHF.  9/25- s/p BSE- advanced to thin liquids (remains on dysphagia 2 diet) 9/30- advanced to dysphagia 3 diet with thin liquids  **RD working remotely**  Patient eating but no PO documentation since 10/9. Pt is drinking Ensure Max provided.  Pt requiring BiPap at night. Per MD note, pt awaiting SNF placement once they receive machine for patient.   Admission weight: 374 lbs. Current weight: 366 lbs.  Medications reviewed.  Labs reviewed: CBGs: 223-228 Low K GFR: 29  Diet Order:   Diet Order            DIET DYS 3 Room service appropriate? Yes; Fluid consistency: Thin  Diet effective now              EDUCATION NEEDS:   Not appropriate for education at this time  Skin:  Skin Assessment: Skin Integrity Issues: Skin Integrity Issues:: Other (Comment) DTI: - Stage II: - Other: venous stasis ulcer to lt tibia  Last BM:  10/12  Height:   Ht Readings from Last 1 Encounters:  05/12/19 5\' 11"  (1.803 m)    Weight:   Wt Readings from Last 1 Encounters:  06/03/19 (!) 166 kg    Ideal Body Weight:  78.2 kg  BMI:  Body mass index is 51.05 kg/m.  Estimated Nutritional Needs:    Kcal:  2100-2300 kcal  Protein:  105-120 grams  Fluid:  >/= 2.1 L/day  Clayton Bibles, MS, RD, LDN Inpatient Clinical Dietitian Pager: [phone] After Hours Pager: [phone]

## 2019-06-04 ENCOUNTER — Other Ambulatory Visit: Payer: Self-pay

## 2019-06-04 DIAGNOSIS — E1169 Type 2 diabetes mellitus with other specified complication: Secondary | ICD-10-CM

## 2019-06-04 DIAGNOSIS — Z8679 Personal history of other diseases of the circulatory system: Secondary | ICD-10-CM

## 2019-06-04 DIAGNOSIS — E876 Hypokalemia: Secondary | ICD-10-CM

## 2019-06-04 DIAGNOSIS — I5033 Acute on chronic diastolic (congestive) heart failure: Secondary | ICD-10-CM | POA: Diagnosis present

## 2019-06-04 LAB — BASIC METABOLIC PANEL
Anion gap: 12 (ref 5–15)
BUN: 26 mg/dL — ABNORMAL HIGH (ref 6–20)
CO2: 28 mmol/L (ref 22–32)
Calcium: 8.6 mg/dL — ABNORMAL LOW (ref 8.9–10.3)
Chloride: 96 mmol/L — ABNORMAL LOW (ref 98–111)
Creatinine, Ser: 2.16 mg/dL — ABNORMAL HIGH (ref 0.61–1.24)
GFR calc Af Amer: 38 mL/min — ABNORMAL LOW (ref >60)
GFR calc non Af Amer: 33 mL/min — ABNORMAL LOW (ref >60)
Glucose, Bld: 238 mg/dL — ABNORMAL HIGH (ref 70–99)
Potassium: 3.2 mmol/L — ABNORMAL LOW (ref 3.5–5.1)
Sodium: 136 mmol/L (ref 135–145)

## 2019-06-04 LAB — GLUCOSE, CAPILLARY
Glucose-Capillary: 198 mg/dL — ABNORMAL HIGH (ref 70–99)
Glucose-Capillary: 242 mg/dL — ABNORMAL HIGH (ref 70–99)
Glucose-Capillary: 217 mg/dL — ABNORMAL HIGH (ref 70–99)
Glucose-Capillary: 217 mg/dL — ABNORMAL HIGH (ref 70–99)
Glucose-Capillary: 195 mg/dL — ABNORMAL HIGH (ref 70–99)

## 2019-06-04 LAB — MAGNESIUM: Magnesium: 1.9 mg/dL (ref 1.7–2.4)

## 2019-06-04 MED ORDER — POTASSIUM CHLORIDE CRYS ER 20 MEQ PO TBCR
40.0000 meq | EXTENDED_RELEASE_TABLET | Freq: Once | ORAL | Status: AC
  Administered 2019-06-04: 40 meq via ORAL
  Filled 2019-06-04: qty 2

## 2019-06-04 MED ORDER — INSULIN GLARGINE 100 UNIT/ML ~~LOC~~ SOLN
24.0000 [IU] | Freq: Every day | SUBCUTANEOUS | Status: DC
  Administered 2019-06-05: 24 [IU] via SUBCUTANEOUS
  Filled 2019-06-04: qty 0.24

## 2019-06-04 NOTE — Progress Notes (Signed)
RT placed patient on BIPAP with 4L O2 bled into circuit. Patient tolerating well at this time. RT will monitor as needed.

## 2019-06-04 NOTE — Patient Outreach (Addendum)
  [facility]) Care Management Chronic Special Needs Program   06/04/2019  Name: [name]., DOB: 24-Dec-1960  MRN: [id number]  The client was discussed in today's interdisciplinary care team meeting.  The following issues were discussed:  Client's needs, Changes in health status, Care Plan, Coordination of care, Care transitions and Issues/barriers to care  Participants present:                     Thea Silversmith, MSN, RN, CCM                     Melissa Sandlin RN,BSN,CCM, CDE  Kelli Churn, RN, CCM, CDE Maryella Shivers, MD            Gilda Crease, PharmD, RPh Bary Castilla, RN, BSN, MS, CCM Coralie Carpen, MD [place]: Babs Bertin, RN  Recommendations/Plan: Care Coordination with Hospital Liaison and inpatient care management team regarding discharge plan/expectations. RNCM will continue to follow.  Thea Silversmith, RN, MSN, [place] [phone]

## 2019-06-04 NOTE — Progress Notes (Signed)
PROGRESS NOTE    [name].  QPY:[id number] DOB: Jun 07, 1961 DOA: 05/12/2019 PCP: Caren Macadam, MD   Brief Narrative:  57 year old male with history of obesity, OHS, OSA, chronic respiratory failure with hypoxia and hypercapnia on 2 to 3 L oxygen at home, noncompliant with CPAP, diabetes mellitus type 2 on insulin, chronic A. fib, CAD, failed CABG in 2014, nonspecific CVA, CKD stage III, chronic diastolic heart failure presented with worsening shortness of breath and was found to be more hypoxic and hypercapnic requiring endotracheal intubation.  During the hospitalization, he was extubated and transferred to the floor.  Palliative care was consulted.  CODE STATUS was changed to DNR.  Awaiting SNF placement.   Assessment & Plan:   Active Problems:   Acute on chronic respiratory failure with hypoxia and hypercapnia (HCC)   Type 2 diabetes mellitus with other specified complication (HCC)   Obstructive sleep apnea   Goals of care, counseling/discussion   Palliative care encounter   Dysphagia   Hypokalemia   History of chronic atrial fibrillation   Acute on chronic diastolic CHF (congestive heart failure) (HCC)  1 acute on chronic hypercapnic and hypoxic respiratory failure/acute on chronic diastolic heart failure/obstructive sleep apnea noncompliance with CPAP On presentation patient was intubated on admission and was on the critical care service and subsequently extubated.  Patient noted to have a overall poor prognosis.  Patient noted to have had a 2D echo 02/22/2019 with a EF of 55 to 60%.  Currently at 97% on 4 L nasal cannula.  With a urine output of 3.750 L over the past 24 hours.  Patient is -26.958 L during this hospitalization.  Patient noted to have used BiPAP overnight for the past 3 nights.  Continue Pulmicort, Mucinex DM, Imdur, Demadex. Patient with 2 intubations over the past month.  Patient with a poor prognosis.  Palliative care consulted and are following.  It is noted that hospice discussions were started but patient's wife wants to pursue rehab options.  Outpatient rehab would not accept patient on trilogy vent.  Patient apparently had not been compliant with trilogy vent on CPAP at home.  Patient was switched to DNR.  Continue strict I's and O's.  Daily weights.  Fluid restriction.  Palliative care following.  If patient condition worsens may need to consider transitioning to full comfort measures.  Follow for now.  2.  Hypokalemia Likely secondary to diuresis.  K. Dur 40 mEq p.o. x1.  May need to be placed on daily supplemental potassium.  3.  History of chronic atrial fibrillation Currently rate controlled on amiodarone.  Eliquis for anticoagulation.  4.  Chronic kidney disease stage III Baseline creatinine approximately 2.6-2.7.  Currently at baseline.  Monitor closely with diuresis.  5.  Diabetes mellitus type 2  Hemoglobin A1c 7.8 on 04/14/2019.  CBG of 198 this morning.  Increase Lantus to 24 units daily.  Continue sliding scale insulin.  Follow.  6.  Chronic lower extremity wounds, POA Continue current wound care.  7.  Morbid obesity  8.  Deconditioning Patient with no follow-up poor prognosis.  Palliative care consulted and family deciding on SNF placement at this time.     DVT prophylaxis: Eliquis Code Status: DNR Family Communication: Updated patient.  No family at bedside. Disposition Plan: SNF when bed available.   Consultants:   PCCM  Palliative care  Procedures:  Intubation/extubation  Antimicrobials:  None   Subjective: Patient in bed eating his lunch.  Denies any chest pain or  shortness of breath.  Stating that his bandages have not been changed yet.  Patient stated used his CPAP/BiPAP overnight.  Objective: Vitals:   06/04/19 0811 06/04/19 0844 06/04/19 1657 06/04/19 1935  BP: 138/66  (!) 160/97   Pulse: (!) 107  (!) 102   Resp: 19  19   Temp: 98.3 F (36.8 C)  (!) 97.5 F (36.4 C)   TempSrc:  Oral     SpO2: 100% 97% 97% 95%  Weight:      Height:        Intake/Output Summary (Last 24 hours) at 06/04/2019 2042 Last data filed at 06/04/2019 1705 Gross per 24 hour  Intake 1320 ml  Output 1300 ml  Net 20 ml   Filed Weights   05/30/19 0421 06/03/19 0343 06/04/19 0522  Weight: (!) 157.4 kg (!) 166 kg (!) 161.5 kg    Examination:  General exam: Appears calm and comfortable  Respiratory system: Minimal expiratory wheezing anterior lung fields.  Bilateral decreased breath sounds in the bases.  No use of accessory muscles of respiration.  Speaking in full sentences.  Cardiovascular system: S1 & S2 heard, RRR. No JVD, murmurs, rubs, gallops or clicks. No pedal edema. Gastrointestinal system: Abdomen is obese, nondistended, soft and nontender. No organomegaly or masses felt. Normal bowel sounds heard. Central nervous system: Alert and oriented. No focal neurological deficits. Extremities: Trace to 1+ bilateral lower extremity edema.  Lower extremity dressings in place.  Skin: No rashes, lesions or ulcers Psychiatry: Judgement and insight appear normal. Mood & affect appropriate.     Data Reviewed: I have personally reviewed following labs and imaging studies  CBC: No results for input(s): WBC, NEUTROABS, HGB, HCT, MCV, PLT in the last 168 hours. Basic Metabolic Panel: Recent Labs  Lab 05/31/19 0430 06/01/19 0557 06/02/19 0545 06/03/19 0418 06/04/19 0510 06/04/19 0851  NA 138 137 139 137  --  136  K 3.3* 3.4* 3.5 3.1*  --  3.2*  CL 95* 96* 97* 94*  --  96*  CO2 33* 30 32 29  --  28  GLUCOSE 175* 205* 193* 223*  --  238*  BUN 23* 25* 24* 28*  --  26*  CREATININE 2.05* 1.95* 2.15* 2.35*  --  2.16*  CALCIUM 8.7* 8.8* 8.7* 8.9  --  8.6*  MG 1.8 1.9 1.9 2.0 1.9  --    GFR: Estimated Creatinine Clearance: 57.9 mL/min (A) (by C-G formula based on SCr of 2.16 mg/dL (H)). Liver Function Tests: No results for input(s): AST, ALT, ALKPHOS, BILITOT, PROT, ALBUMIN in the last  168 hours. No results for input(s): LIPASE, AMYLASE in the last 168 hours. No results for input(s): AMMONIA in the last 168 hours. Coagulation Profile: No results for input(s): INR, PROTIME in the last 168 hours. Cardiac Enzymes: No results for input(s): CKTOTAL, CKMB, CKMBINDEX, TROPONINI in the last 168 hours. BNP (last 3 results) No results for input(s): PROBNP in the last 8760 hours. HbA1C: No results for input(s): HGBA1C in the last 72 hours. CBG: Recent Labs  Lab 06/03/19 2319 06/04/19 0250 06/04/19 1158 06/04/19 1622 06/04/19 1922  GLUCAP 176* 198* 242* 217* 217*   Lipid Profile: No results for input(s): CHOL, HDL, LDLCALC, TRIG, CHOLHDL, LDLDIRECT in the last 72 hours. Thyroid Function Tests: No results for input(s): TSH, T4TOTAL, FREET4, T3FREE, THYROIDAB in the last 72 hours. Anemia Panel: No results for input(s): VITAMINB12, FOLATE, FERRITIN, TIBC, IRON, RETICCTPCT in the last 72 hours. Sepsis Labs: No results for input(s):  PROCALCITON, LATICACIDVEN in the last 168 hours.  Recent Results (from the past 240 hour(s))  SARS Coronavirus 2 by RT PCR (hospital order, performed in [facility] hospital lab) Nasopharyngeal Nasopharyngeal Swab     Status: None   Collection Time: 06/03/19  7:39 AM   Specimen: Nasopharyngeal Swab  Result Value Ref Range Status   SARS Coronavirus 2 NEGATIVE NEGATIVE Final    Comment: (NOTE) If result is NEGATIVE SARS-CoV-2 target nucleic acids are NOT DETECTED. The SARS-CoV-2 RNA is generally detectable in upper and lower  respiratory specimens during the acute phase of infection. The lowest  concentration of SARS-CoV-2 viral copies this assay can detect is 250  copies / mL. A negative result does not preclude SARS-CoV-2 infection  and should not be used as the sole basis for treatment or other  patient management decisions.  A negative result may occur with  improper specimen collection / handling, submission of specimen other  than  nasopharyngeal swab, presence of viral mutation(s) within the  areas targeted by this assay, and inadequate number of viral copies  (<250 copies / mL). A negative result must be combined with clinical  observations, patient history, and epidemiological information. If result is POSITIVE SARS-CoV-2 target nucleic acids are DETECTED. The SARS-CoV-2 RNA is generally detectable in upper and lower  respiratory specimens dur ing the acute phase of infection.  Positive  results are indicative of active infection with SARS-CoV-2.  Clinical  correlation with patient history and other diagnostic information is  necessary to determine patient infection status.  Positive results do  not rule out bacterial infection or co-infection with other viruses. If result is PRESUMPTIVE POSTIVE SARS-CoV-2 nucleic acids MAY BE PRESENT.   A presumptive positive result was obtained on the submitted specimen  and confirmed on repeat testing.  While 2019 novel coronavirus  (SARS-CoV-2) nucleic acids may be present in the submitted sample  additional confirmatory testing may be necessary for epidemiological  and / or clinical management purposes  to differentiate between  SARS-CoV-2 and other Sarbecovirus currently known to infect humans.  If clinically indicated additional testing with an alternate test  methodology [id number]) is advised. The SARS-CoV-2 RNA is generally  detectable in upper and lower respiratory sp ecimens during the acute  phase of infection. The expected result is Negative. Fact Sheet for Patients:  StrictlyIdeas.no Fact Sheet for Healthcare Providers: BankingDealers.co.za This test is not yet approved or cleared by the [place] FDA and has been authorized for detection and/or diagnosis of SARS-CoV-2 by FDA under an Emergency Use Authorization (EUA).  This EUA will remain in effect (meaning this test can be used) for the duration of the  COVID-19 declaration under Section 564(b)(1) of the Act, 21 U.S.C. section 360bbb-3(b)(1), unless the authorization is terminated or revoked sooner. Performed at [facility], [place] [address], [place], [place] [address]          Radiology Studies: No results found.      Scheduled Meds: . amiodarone  100 mg Oral Daily  . apixaban  5 mg Oral BID  . artificial tears   Both Eyes QHS  . budesonide (PULMICORT) nebulizer solution  0.25 mg Nebulization BID  . Chlorhexidine Gluconate Cloth  6 each Topical Daily  . dextromethorphan-guaiFENesin  2 tablet Oral BID  . hydroxypropyl methylcellulose / hypromellose  1 drop Both Eyes TID  . insulin aspart  0-15 Units Subcutaneous Q4H  . insulin glargine  20 Units Subcutaneous Daily  . ipratropium  0.5  mg Nebulization BID  . isosorbide mononitrate  30 mg Oral QHS  . levalbuterol  0.63 mg Nebulization BID  . mouth rinse  15 mL Mouth Rinse BID  . pantoprazole  40 mg Oral QHS  . Ensure Max Protein  11 oz Oral Daily  . rosuvastatin  20 mg Oral q1800  . senna-docusate  1 tablet Oral BID  . sodium chloride flush  10-40 mL Intracatheter Q12H  . topiramate  100 mg Oral QHS  . torsemide  80 mg Oral BID   Continuous Infusions:   LOS: 23 days    Time spent: 35 minutes    Irine Seal, MD [facility]  If 7PM-7AM, please contact night-coverage [email or web address] 06/04/2019, 8:42 PM

## 2019-06-04 NOTE — Progress Notes (Signed)
Pt wanted to attempt to have a BM. CNA and I got him up to the commode and assissted pt with sponge bath. Pt has history of being inappropriate with male coworkers. Pt fully able to wash himself. Pt asks if I could wash his penis and testicles specifically. I asked pt to try to do it himself and he threw his wash cloth and towel and yelled at me. When assisting with transferring pt from commode to bed, pt continued to treat me and CNA in an inappropriate way.   I would highly recommend pt only be cared for by male staff as he has continued to be extremely inappropriate with male staff.

## 2019-06-04 NOTE — Progress Notes (Signed)
Physical Therapy Treatment Patient Details Name: [name]. MRN: [id number] DOB: March 09, 1961 Today's Date: 06/04/2019    History of Present Illness Pt is a 58 yo male admitted with worsening SOB, found to hypercarbic and hypoxic. ETT 9/21/9/23. PMH OHS, OSA, chronic respiratory failure with hypoxia and hypercapnia, on baseline 2 to 3 L/min home oxygen, noncompliant with CPAP IDDM, Afib, CAD, failed CABG in 2014, CVA, CKD stage 3-4, and diastolic HF    PT Comments    Pt in bed on entry unhappy that he has not had a BM all day. Pt request to go into bathroom, however PT refused due to small size of bathroom and inability to provide proper assist in and emergent situation. PT offered to get pt up to [facility]. Pt very unhappy throwing his remote, pt reports inability to use BSC on floor due to its small size. Pt refusing to get up. PT had bariatric BSC ordered to bring to room and with encouragement pt agreed to get up to chair. Pt is currently min guard for bed mobility, min A for power up to RW and hands on min guard for slow shuffling steps to recliner. D/c plans remain appropriate. Pt hopeful for d/c tomorrow.     Follow Up Recommendations  SNF;Supervision/Assistance - 24 hour     Equipment Recommendations  None recommended by PT       Precautions / Restrictions Precautions Precautions: Fall Restrictions Weight Bearing Restrictions: No    Mobility  Bed Mobility Overal bed mobility: Needs Assistance Bed Mobility: Supine to Sit     Supine to sit: Min guard     General bed mobility comments: heavy use of rail to pull to EoB  Transfers Overall transfer level: Needs assistance Equipment used: Rolling walker (2 wheeled)(bariatric RW) Transfers: Sit to/from Stand Sit to Stand: Min assist         General transfer comment: minA for power up to RW  Ambulation/Gait Ambulation/Gait assistance: Min guard Gait Distance (Feet): 2 Feet Assistive device: Rolling walker (2  wheeled) Gait Pattern/deviations: Step-to pattern;Shuffle;Wide base of support Gait velocity: decreased Gait velocity interpretation: <1.31 ft/sec, indicative of household ambulator General Gait Details: pt with widened BOS and short shuffling steps. RW held anteriorly with forward trunk lean.          Balance Overall balance assessment: Needs assistance Sitting-balance support: Single extremity supported;Feet supported Sitting balance-Leahy Scale: Fair Sitting balance - Comments: supervision for sitting balance   Standing balance support: Bilateral upper extremity supported Standing balance-Leahy Scale: Fair Standing balance comment: close supervision with BUE support on RW                            Cognition Arousal/Alertness: Awake/alert Behavior During Therapy: WFL for tasks assessed/performed Overall Cognitive Status: History of cognitive impairments - at baseline                                 General Comments: decreased safety awareness, continues to use snuff during hospital stay         General Comments General comments (skin integrity, edema, etc.): Pt very upset when therapist refused to allow pt into bathroom for BM due to small size of bathroom and inability for proper assistance needed in emergent situation. PT had bariatric BSC ordered for room, pt still unhappy      Pertinent Vitals/Pain Pain Assessment: No/denies pain  PT Goals (current goals can now be found in the care plan section) Acute Rehab PT Goals Patient Stated Goal: get stronger and get home PT Goal Formulation: With patient Time For Goal Achievement: 06/04/19 Potential to Achieve Goals: Fair Progress towards PT goals: Not progressing toward goals - comment(pt too unhappy to progress ambualtion today )    Frequency    Min 3X/week      PT Plan Current plan remains appropriate       AM-PAC PT "6 Clicks" Mobility   Outcome Measure  Help needed  turning from your back to your side while in a flat bed without using bedrails?: None Help needed moving from lying on your back to sitting on the side of a flat bed without using bedrails?: A Little Help needed moving to and from a bed to a chair (including a wheelchair)?: A Little Help needed standing up from a chair using your arms (e.g., wheelchair or bedside chair)?: A Little Help needed to walk in hospital room?: A Little Help needed climbing 3-5 steps with a railing? : A Lot 6 Click Score: 18    End of Session Equipment Utilized During Treatment: Oxygen Activity Tolerance: Patient tolerated treatment well Patient left: in chair;with call bell/phone within reach;with family/visitor present Nurse Communication: Mobility status PT Visit Diagnosis: Muscle weakness (generalized) (M62.81);Difficulty in walking, not elsewhere classified (R26.2);Unsteadiness on feet (R26.81)     Time: 9417-4081 PT Time Calculation (min) (ACUTE ONLY): 22 min  Charges:  $Gait Training: 8-22 mins $Therapeutic Activity: 8-22 mins                     Jamar Casagrande B. Migdalia Dk PT, DPT Acute Rehabilitation Services Pager [phone] Office [phone]    [place] 06/04/2019, 5:19 PM

## 2019-06-05 DIAGNOSIS — G894 Chronic pain syndrome: Secondary | ICD-10-CM | POA: Diagnosis not present

## 2019-06-05 DIAGNOSIS — R41841 Cognitive communication deficit: Secondary | ICD-10-CM | POA: Diagnosis not present

## 2019-06-05 DIAGNOSIS — G510 Bell's palsy: Secondary | ICD-10-CM | POA: Diagnosis not present

## 2019-06-05 DIAGNOSIS — Z794 Long term (current) use of insulin: Secondary | ICD-10-CM | POA: Diagnosis not present

## 2019-06-05 DIAGNOSIS — R1310 Dysphagia, unspecified: Secondary | ICD-10-CM | POA: Diagnosis not present

## 2019-06-05 DIAGNOSIS — E1122 Type 2 diabetes mellitus with diabetic chronic kidney disease: Secondary | ICD-10-CM | POA: Diagnosis not present

## 2019-06-05 DIAGNOSIS — J449 Chronic obstructive pulmonary disease, unspecified: Secondary | ICD-10-CM | POA: Diagnosis not present

## 2019-06-05 DIAGNOSIS — N184 Chronic kidney disease, stage 4 (severe): Secondary | ICD-10-CM | POA: Diagnosis not present

## 2019-06-05 DIAGNOSIS — G459 Transient cerebral ischemic attack, unspecified: Secondary | ICD-10-CM | POA: Diagnosis not present

## 2019-06-05 DIAGNOSIS — Z8679 Personal history of other diseases of the circulatory system: Secondary | ICD-10-CM | POA: Diagnosis not present

## 2019-06-05 DIAGNOSIS — J9612 Chronic respiratory failure with hypercapnia: Secondary | ICD-10-CM | POA: Diagnosis not present

## 2019-06-05 DIAGNOSIS — I4891 Unspecified atrial fibrillation: Secondary | ICD-10-CM | POA: Diagnosis not present

## 2019-06-05 DIAGNOSIS — G4733 Obstructive sleep apnea (adult) (pediatric): Secondary | ICD-10-CM | POA: Diagnosis not present

## 2019-06-05 DIAGNOSIS — N179 Acute kidney failure, unspecified: Secondary | ICD-10-CM | POA: Diagnosis not present

## 2019-06-05 DIAGNOSIS — J9622 Acute and chronic respiratory failure with hypercapnia: Secondary | ICD-10-CM | POA: Diagnosis not present

## 2019-06-05 DIAGNOSIS — K219 Gastro-esophageal reflux disease without esophagitis: Secondary | ICD-10-CM | POA: Diagnosis not present

## 2019-06-05 DIAGNOSIS — I1310 Hypertensive heart and chronic kidney disease without heart failure, with stage 1 through stage 4 chronic kidney disease, or unspecified chronic kidney disease: Secondary | ICD-10-CM | POA: Diagnosis not present

## 2019-06-05 DIAGNOSIS — I5033 Acute on chronic diastolic (congestive) heart failure: Secondary | ICD-10-CM | POA: Diagnosis not present

## 2019-06-05 DIAGNOSIS — Z7401 Bed confinement status: Secondary | ICD-10-CM | POA: Diagnosis not present

## 2019-06-05 DIAGNOSIS — R2689 Other abnormalities of gait and mobility: Secondary | ICD-10-CM | POA: Diagnosis not present

## 2019-06-05 DIAGNOSIS — I10 Essential (primary) hypertension: Secondary | ICD-10-CM | POA: Diagnosis not present

## 2019-06-05 DIAGNOSIS — G609 Hereditary and idiopathic neuropathy, unspecified: Secondary | ICD-10-CM | POA: Diagnosis not present

## 2019-06-05 DIAGNOSIS — J9600 Acute respiratory failure, unspecified whether with hypoxia or hypercapnia: Secondary | ICD-10-CM | POA: Diagnosis not present

## 2019-06-05 DIAGNOSIS — Z6841 Body Mass Index (BMI) 40.0 and over, adult: Secondary | ICD-10-CM | POA: Diagnosis not present

## 2019-06-05 DIAGNOSIS — J9621 Acute and chronic respiratory failure with hypoxia: Secondary | ICD-10-CM | POA: Diagnosis not present

## 2019-06-05 DIAGNOSIS — M2550 Pain in unspecified joint: Secondary | ICD-10-CM | POA: Diagnosis not present

## 2019-06-05 DIAGNOSIS — E1169 Type 2 diabetes mellitus with other specified complication: Secondary | ICD-10-CM | POA: Diagnosis not present

## 2019-06-05 DIAGNOSIS — E876 Hypokalemia: Secondary | ICD-10-CM | POA: Diagnosis not present

## 2019-06-05 DIAGNOSIS — R748 Abnormal levels of other serum enzymes: Secondary | ICD-10-CM | POA: Diagnosis not present

## 2019-06-05 DIAGNOSIS — M6281 Muscle weakness (generalized): Secondary | ICD-10-CM | POA: Diagnosis not present

## 2019-06-05 DIAGNOSIS — E785 Hyperlipidemia, unspecified: Secondary | ICD-10-CM | POA: Diagnosis not present

## 2019-06-05 LAB — BASIC METABOLIC PANEL
Anion gap: 13 (ref 5–15)
BUN: 27 mg/dL — ABNORMAL HIGH (ref 6–20)
CO2: 29 mmol/L (ref 22–32)
Calcium: 9 mg/dL (ref 8.9–10.3)
Chloride: 95 mmol/L — ABNORMAL LOW (ref 98–111)
Creatinine, Ser: 2.38 mg/dL — ABNORMAL HIGH (ref 0.61–1.24)
GFR calc Af Amer: 34 mL/min — ABNORMAL LOW (ref >60)
GFR calc non Af Amer: 29 mL/min — ABNORMAL LOW (ref >60)
Glucose, Bld: 168 mg/dL — ABNORMAL HIGH (ref 70–99)
Potassium: 3.4 mmol/L — ABNORMAL LOW (ref 3.5–5.1)
Sodium: 137 mmol/L (ref 135–145)

## 2019-06-05 LAB — CBC WITH DIFFERENTIAL/PLATELET
Abs Immature Granulocytes: 0.03 10*3/uL (ref 0.00–0.07)
Basophils Absolute: 0.1 10*3/uL (ref 0.0–0.1)
Basophils Relative: 1 %
Eosinophils Absolute: 1.2 10*3/uL — ABNORMAL HIGH (ref 0.0–0.5)
Eosinophils Relative: 11 %
HCT: 38.1 % — ABNORMAL LOW (ref 39.0–52.0)
Hemoglobin: 11.6 g/dL — ABNORMAL LOW (ref 13.0–17.0)
Immature Granulocytes: 0 %
Lymphocytes Relative: 17 %
Lymphs Abs: 2 10*3/uL (ref 0.7–4.0)
MCH: 29.1 pg (ref 26.0–34.0)
MCHC: 30.4 g/dL (ref 30.0–36.0)
MCV: 95.5 fL (ref 80.0–100.0)
Monocytes Absolute: 1 10*3/uL (ref 0.1–1.0)
Monocytes Relative: 9 %
Neutro Abs: 7.4 10*3/uL (ref 1.7–7.7)
Neutrophils Relative %: 62 %
Platelets: 302 10*3/uL (ref 150–400)
RBC: 3.99 MIL/uL — ABNORMAL LOW (ref 4.22–5.81)
RDW: 13.6 % (ref 11.5–15.5)
WBC: 11.9 10*3/uL — ABNORMAL HIGH (ref 4.0–10.5)
nRBC: 0 % (ref 0.0–0.2)

## 2019-06-05 LAB — GLUCOSE, CAPILLARY
Glucose-Capillary: 194 mg/dL — ABNORMAL HIGH (ref 70–99)
Glucose-Capillary: 141 mg/dL — ABNORMAL HIGH (ref 70–99)
Glucose-Capillary: 210 mg/dL — ABNORMAL HIGH (ref 70–99)
Glucose-Capillary: 230 mg/dL — ABNORMAL HIGH (ref 70–99)

## 2019-06-05 LAB — MAGNESIUM: Magnesium: 1.9 mg/dL (ref 1.7–2.4)

## 2019-06-05 MED ORDER — INSULIN GLARGINE 100 UNIT/ML ~~LOC~~ SOLN: 25.0000 [IU] | Freq: Every day | SUBCUTANEOUS | 0 refills | Status: AC

## 2019-06-05 MED ORDER — SENNOSIDES-DOCUSATE SODIUM 8.6-50 MG PO TABS: 1.0000 | ORAL_TABLET | Freq: Two times a day (BID) | ORAL | Status: DC

## 2019-06-05 MED ORDER — POTASSIUM CHLORIDE CRYS ER 20 MEQ PO TBCR
20.0000 meq | EXTENDED_RELEASE_TABLET | Freq: Once | ORAL | Status: AC
  Administered 2019-06-05: 20 meq via ORAL
  Filled 2019-06-05: qty 1

## 2019-06-05 MED ORDER — PANTOPRAZOLE SODIUM 40 MG PO TBEC: 40.0000 mg | DELAYED_RELEASE_TABLET | Freq: Every day | ORAL | 0 refills | Status: DC

## 2019-06-05 MED ORDER — HYDROCODONE-ACETAMINOPHEN 10-325 MG PO TABS: 1.0000 | ORAL_TABLET | Freq: Four times a day (QID) | ORAL | 0 refills | Status: AC | PRN

## 2019-06-05 MED ORDER — ENSURE MAX PROTEIN PO LIQD: 11.0000 [oz_av] | Freq: Every day | ORAL | Status: AC

## 2019-06-05 NOTE — Discharge Summary (Addendum)
Physician Discharge Summary  [name]. PPJ:[id number] DOB: Aug 06, 1961 DOA: 05/12/2019  PCP: Caren Macadam, MD  Admit date: 05/12/2019 Discharge date: 06/05/2019  Time spent: 60 minutes  Recommendations for Outpatient Follow-up:  1. Follow-up with MD at skilled nursing facility.  Patient will need a basic metabolic profile done in 1 week to follow-up on electrolytes and renal function. 2. Patient to be discharged to skilled nursing facility with palliative care following and on BiPAP at bedtime. 3. Follow-up with pulmonary post discharge from skilled nursing facility.   Discharge Diagnoses:  Principal Problem:   Acute on chronic respiratory failure with hypoxia and hypercapnia (HCC) Active Problems:   Acute on chronic diastolic CHF (congestive heart failure) (HCC)   CKD stage 3 secondary to diabetes ([place])   Type 2 diabetes mellitus with other specified complication (HCC)   Chronic obstructive pulmonary disease (HCC)   Obstructive sleep apnea   Goals of care, counseling/discussion   Palliative care encounter   Dysphagia   Hypokalemia   History of chronic atrial fibrillation   Discharge Condition: Stable and improved.  Diet recommendation: Heart healthy/carb modified  Filed Weights   06/03/19 0343 06/04/19 0522 06/05/19 0338  Weight: (!) 166 kg (!) 161.5 kg 105 kg    History of present illness:  Per Dr Elsworth Soho 58 yr old morbidly obese M PMH  OHS, OSA, chronic respiratory failure with hypoxia and hypercapnia, on baseline 2 to 3 L/min home oxygen, noncompliant with/refuses CPAP and has declined sleep study set up during previous in-pt stay,  IDDM, Afib, CAD, failed CABG in 2014, CVA, CKD [id number], and diastolic HF known to heart failure service and PCCM presented to ED with 3 days of worsening shortness of breath, found to be hypercarbic and hypoxic. He was requiring 15 L oxygen on nonrebreather and became somnolent therefore required endotracheal intubation.  Received empiric abx. PCCM consulted for admission.  W/u significant for CXR c/w pulmonary edema. ABG pH 7.337 pCO2 60.6 pO2 78 Bicarb 32.4 K+ 5.5 Cre 2.74 (baseline from 2019-2020 1.62-peak of 4.19)  BNP 198 (down from 301.3 04/13/2019) WBC 7.7.  Lactic acid 0.8  Per EMR review: Eval by HF for hospital F/U 9/18 Increased torsemide to 76m BID   Admitted 04/30/2018 with increased dyspnea and mental status changes. He was thought to have acute metobolic encephalopathy with hypercapneic respiratory failure. Creatinine initially 4.19, has decreased to 2.15. He was on Bipap initially. He has been in atrial fibrillation with RVR, was started on Coreg but HR still running high. ECHO was nondiagnostic so TEE completed with cardioversion. He was placed amiodarone. Diuresed with IV lasix and later transitioned to torsemide 40 mg twice a day. [facility]  Admitted 04/13/19 with A/C respiratory failure. Intubated . Placed empiric antibiotics and IV lasix. He did not tolerate Bipap. He was able to wean his oxygen to 3 liters. Palliative Care consulted he declined hospice/palliative care.   Hospital Course:  1 acute on chronic hypercapnic and hypoxic respiratory failure/acute on chronic diastolic heart failure/obstructive sleep apnea noncompliance with CPAP On presentation patient was intubated on admission and was on the critical care service and subsequently extubated.  Patient noted to have a overall poor prognosis.  Patient noted to have had a 2D echo 02/22/2019 with a EF of 55 to 60%.    Patient improved clinically with good sats of 100% on 4 L nasal cannula.  He was also diuresed during the hospitalization with good urine output.  Patient  was -23.828 L during his hospitalization.  Patient subsequently placed on Pulmicort, Mucinex, Imdur, Demadex.  Patient was also started on BiPAP at night with good results. Patient with 2 intubations over the past month.  Patient with a  poor prognosis.  Palliative care consulted and are following. It is noted that hospice discussions were started but patient's wife wants to pursue rehab options.  Outpatient rehab would not accept patient on trilogy vent.  Patient apparently had not been compliant with trilogy vent on CPAP at home.  Patient was switched to DNR.  Patient will be discharged to skilled nursing facility with palliative care following.   2.  Hypokalemia Likely secondary to diuresis.  Potassium was supplemented throughout the hospitalization.  Outpatient follow-up.   3.  History of chronic atrial fibrillation Remained rate controlled on amiodarone.  Eliquis for anticoagulation.   4.  Chronic kidney disease stage III Baseline creatinine approximately 2.6-2.7.  Renal function remained stable during the hospitalization.  Outpatient follow-up.  5.  Diabetes mellitus type 2  Hemoglobin A1c 7.8 on 04/14/2019.    Lantus dose adjusted to 24 units daily.  Patient maintained on sliding scale insulin.  Outpatient follow-up.    6.  Chronic lower extremity wounds, POA Patient was seen by wound care and recommendations made which none during the hospitalization.   7.  Morbid obesity  8.  Deconditioning Patient with no follow-up poor prognosis.  Palliative care consulted and family decided on SNF placement at this time.  Palliative to follow at the facility.   Procedures:  Intubation/extubation  Consultations:  PCCM  Palliative care  Discharge Exam: Vitals:   06/05/19 0822 06/05/19 0915  BP:  (!) 151/47  Pulse:  86  Resp:  19  Temp:  97.8 F (36.6 C)  SpO2: 95% 100%    General: NAD Cardiovascular: RRR Respiratory: CTAB  Discharge Instructions   Discharge Instructions    Diet - low sodium heart healthy   Complete by: As directed    1500cc/day fluid restriction   Diet Carb Modified   Complete by: As directed    Increase activity slowly   Complete by: As directed      Allergies as of  06/05/2019      Reactions   Morphine And Related Anaphylaxis   "makes me stop breathing"   Novocain [procaine] Anaphylaxis   Carbamazepine    shaking   Codeine Other (See Comments)   "makes heart race" per patient   Gabapentin Other (See Comments)   Blood in urine   Ivp Dye [iodinated Diagnostic Agents]    Passing out   Other    Steroids makes hearts race   Prednisone    Heart racing      Medication List    STOP taking these medications   levofloxacin 500 MG tablet Commonly known as: LEVAQUIN     TAKE these medications   albuterol 108 (90 Base) MCG/ACT inhaler Commonly known as: VENTOLIN HFA Inhale 2 puffs into the lungs every 6 (six) hours as needed for wheezing or shortness of breath.   amiodarone 100 MG tablet Commonly known as: PACERONE Take 1 tablet (100 mg total) by mouth daily.   apixaban 5 MG Tabs tablet Commonly known as: Eliquis Take 1 tablet (5 mg total) by mouth 2 (two) times daily.   artificial tears Oint ophthalmic ointment Place 1 application into both eyes at bedtime as needed for dry eyes.   carvedilol 12.5 MG tablet Commonly known as: COREG Take 1 tablet (12.5  mg total) by mouth 2 (two) times daily with a meal.   docusate sodium 100 MG capsule Commonly known as: COLACE Take 100 mg by mouth at bedtime.   DuoDERM Hydroactive Gel duoderm or generic equivalent gel dressing. 4cmx4cm. Apply to wound q 3 days until healed.   Ensure Max Protein Liqd Take 330 mLs (11 oz total) by mouth daily. Start taking on: June 06, 2019   HYDROcodone-acetaminophen 10-325 MG tablet Commonly known as: NORCO Take 1 tablet by mouth every 6 (six) hours as needed (pain).   hydroxypropyl methylcellulose / hypromellose 2.5 % ophthalmic solution Commonly known as: ISOPTO TEARS / GONIOVISC Place 1 drop into both eyes 3 (three) times daily as needed for dry eyes.   insulin glargine 100 UNIT/ML injection Commonly known as: Lantus Inject 0.25 mLs (25 Units total)  into the skin daily. What changed: See the new instructions.   Insulin Pen Needle 31G X 8 MM Misc 1 Units by Does not apply route 2 (two) times a day.   ipratropium 0.02 % nebulizer solution Commonly known as: ATROVENT Take 2.5 mLs (0.5 mg total) by nebulization 2 (two) times daily.   isosorbide mononitrate 30 MG 24 hr tablet Commonly known as: IMDUR Take 1 tablet (30 mg total) by mouth daily. What changed: when to take this   levalbuterol 0.63 MG/3ML nebulizer solution Commonly known as: XOPENEX Take 3 mLs (0.63 mg total) by nebulization 2 (two) times daily.   multivitamin with minerals Tabs tablet Take 1 tablet by mouth daily.   nystatin powder Commonly known as: MYCOSTATIN/NYSTOP APPLY  1  GRAM TOPICALLY TWICE DAILY AS NEEDED Patient would like Rx delivered What changed:   how much to take  how to take this  when to take this  reasons to take this  additional instructions   ONE TOUCH ULTRA MINI w/Device Kit Use as directed twice a day   ONE TOUCH ULTRA TEST test strip Generic drug: glucose blood Use as instructed twice a day   onetouch ultrasoft lancets Use as instructed   pantoprazole 40 MG tablet Commonly known as: PROTONIX Take 1 tablet (40 mg total) by mouth at bedtime.   potassium chloride SA 20 MEQ tablet Commonly known as: KLOR-CON Take 2 tablets (40 mEq total) by mouth 2 (two) times daily.   rosuvastatin 20 MG tablet Commonly known as: CRESTOR Take 1 tablet (20 mg total) by mouth daily at 6 PM.   senna-docusate 8.6-50 MG tablet Commonly known as: Senokot-S Take 1 tablet by mouth 2 (two) times daily.   tiZANidine 4 MG tablet Commonly known as: ZANAFLEX TAKE 1 TABLET BY MOUTH FOUR TIMES DAILY AS NEEDED FOR MUSCLE SPASMS What changed:   how much to take  how to take this  when to take this  reasons to take this  additional instructions   topiramate 100 MG tablet Commonly known as: TOPAMAX Take 1 tablet (100 mg total) by mouth at  bedtime.   torsemide 20 MG tablet Commonly known as: DEMADEX Take 4 tablets (80 mg total) by mouth 2 (two) times daily.      Allergies  Allergen Reactions  . Morphine And Related Anaphylaxis    "makes me stop breathing"  . Novocain [Procaine] Anaphylaxis  . Carbamazepine     shaking  . Codeine Other (See Comments)    "makes heart race" per patient  . Gabapentin Other (See Comments)    Blood in urine  . Ivp Dye [Iodinated Diagnostic Agents]     Passing  out  . Other     Steroids makes hearts race  . Prednisone     Heart racing    Contact information for follow-up providers    MD AT SNF Follow up.            Contact information for after-discharge care    Destination    [facility] Preferred SNF .   Service: Skilled Nursing Contact information: [address] [place] [phone]                   The results of significant diagnostics from this hospitalization (including imaging, microbiology, ancillary and laboratory) are listed below for reference.    Significant Diagnostic Studies: Dg Abd 1 View  Result Date: 05/12/2019 CLINICAL DATA:  Feeding tube placement EXAM: ABDOMEN - 1 VIEW COMPARISON:  Chest x-ray 05/12/2019 FINDINGS: NG tube is in place with the tip in the distal stomach. Nonobstructive bowel gas pattern. No free air. IMPRESSION: NG tube tip in the distal stomach. Electronically Signed   By: Rolm Baptise M.D.   On: 05/12/2019 17:53   Dg Chest Port 1 View  Result Date: 05/13/2019 CLINICAL DATA:  PICC line. EXAM: PORTABLE CHEST 1 VIEW COMPARISON:  05/13/2019. FINDINGS: PICC line noted with its tip over superior vena cava. Endotracheal tube, NG tube in stable position. Prior CABG. Cardiomegaly. Bilateral interstitial prominence suggesting interstitial edema noted. No prominent pleural effusion. No pneumothorax. IMPRESSION: 1. PICC line noted with tip over superior vena cava. Endotracheal tube and NG tube stable  position. 2. Prior CABG. Cardiomegaly with bilateral interstitial prominence suggesting interstitial edema. Electronically Signed   By: Marcello Moores  Register   On: 05/13/2019 12:33   Dg Chest Port 1 View  Result Date: 05/13/2019 CLINICAL DATA:  58 year old male with a history of respiratory failure EXAM: PORTABLE CHEST 1 VIEW COMPARISON:  May 12, 2019 FINDINGS: Incomplete visualization of the right chest. Similar distribution of reticulonodular opacities of the bilateral lungs with improved appearance at the left upper lung. No pneumothorax. Endotracheal tube terminates suitably above the carina, approximately 6 cm. Gastric tube projects over the mediastinum and terminates out of the field of view. Surgical changes of median sternotomy. IMPRESSION: Limited plain film demonstrates persisting bilateral reticulonodular opacities with improved aeration at the left upper lung from the prior. Unchanged gastric tube and endotracheal tube. Electronically Signed   By: Corrie Mckusick D.O.   On: 05/13/2019 08:22   Dg Chest Port 1 View  Result Date: 05/12/2019 CLINICAL DATA:  Shortness of breath, hypotension, intubation, concern for COVID-19 EXAM: PORTABLE CHEST 1 VIEW COMPARISON:  04/23/2019 FINDINGS: Interval endotracheal intubation, tube tip in appropriate position over the mid trachea. There is new, extensive bilateral irregular heterogeneous and interstitial opacity. Cardiomegaly status post median sternotomy. IMPRESSION: 1. Interval endotracheal intubation, tube tip in appropriate position over the mid trachea. 2. There is new, extensive bilateral irregular heterogeneous and interstitial opacity, consistent with edema or infection. 3.  Cardiomegaly status post median sternotomy. Electronically Signed   By: Eddie Candle M.D.   On: 05/12/2019 11:37   Dg Abd Portable 1v  Result Date: 05/12/2019 CLINICAL DATA:  OG tube placement EXAM: PORTABLE ABDOMEN - 1 VIEW COMPARISON:  05/12/2019 FINDINGS: OG tube has been  advanced. The tip is likely in the proximal duodenum. IMPRESSION: OG tube tip likely in the proximal duodenum. Electronically Signed   By: Rolm Baptise M.D.   On: 05/12/2019 23:33   [place] Ekg Site Rite  Result Date: 05/13/2019  If [employer] not attached, placement could not be confirmed due to current cardiac rhythm.   Microbiology: Recent Results (from the past 240 hour(s))  SARS Coronavirus 2 by RT PCR (hospital order, performed in [facility] hospital lab) Nasopharyngeal Nasopharyngeal Swab     Status: None   Collection Time: 06/03/19  7:39 AM   Specimen: Nasopharyngeal Swab  Result Value Ref Range Status   SARS Coronavirus 2 NEGATIVE NEGATIVE Final    Comment: (NOTE) If result is NEGATIVE SARS-CoV-2 target nucleic acids are NOT DETECTED. The SARS-CoV-2 RNA is generally detectable in upper and lower  respiratory specimens during the acute phase of infection. The lowest  concentration of SARS-CoV-2 viral copies this assay can detect is 250  copies / mL. A negative result does not preclude SARS-CoV-2 infection  and should not be used as the sole basis for treatment or other  patient management decisions.  A negative result may occur with  improper specimen collection / handling, submission of specimen other  than nasopharyngeal swab, presence of viral mutation(s) within the  areas targeted by this assay, and inadequate number of viral copies  (<250 copies / mL). A negative result must be combined with clinical  observations, patient history, and epidemiological information. If result is POSITIVE SARS-CoV-2 target nucleic acids are DETECTED. The SARS-CoV-2 RNA is generally detectable in upper and lower  respiratory specimens dur ing the acute phase of infection.  Positive  results are indicative of active infection with SARS-CoV-2.  Clinical  correlation with patient history and other diagnostic information is  necessary to determine patient infection status.  Positive results do   not rule out bacterial infection or co-infection with other viruses. If result is PRESUMPTIVE POSTIVE SARS-CoV-2 nucleic acids MAY BE PRESENT.   A presumptive positive result was obtained on the submitted specimen  and confirmed on repeat testing.  While 2019 novel coronavirus  (SARS-CoV-2) nucleic acids may be present in the submitted sample  additional confirmatory testing may be necessary for epidemiological  and / or clinical management purposes  to differentiate between  SARS-CoV-2 and other Sarbecovirus currently known to infect humans.  If clinically indicated additional testing with an alternate test  methodology [phone]) is advised. The SARS-CoV-2 RNA is generally  detectable in upper and lower respiratory sp ecimens during the acute  phase of infection. The expected result is Negative. Fact Sheet for Patients:  StrictlyIdeas.no Fact Sheet for Healthcare Providers: BankingDealers.co.za This test is not yet approved or cleared by the [place] FDA and has been authorized for detection and/or diagnosis of SARS-CoV-2 by FDA under an Emergency Use Authorization (EUA).  This EUA will remain in effect (meaning this test can be used) for the duration of the COVID-19 declaration under Section 564(b)(1) of the Act, 21 U.S.C. section 360bbb-3(b)(1), unless the authorization is terminated or revoked sooner. Performed at [facility], [place] [address], [place], [place] [address]      Labs: Basic Metabolic Panel: Recent Labs  Lab 06/01/19 0557 06/02/19 0545 06/03/19 0418 06/04/19 0510 06/04/19 0851 06/05/19 0429  NA 137 139 137  --  136 137  K 3.4* 3.5 3.1*  --  3.2* 3.4*  CL 96* 97* 94*  --  96* 95*  CO2 30 32 29  --  28 29  GLUCOSE 205* 193* 223*  --  238* 168*  BUN 25* 24* 28*  --  26* 27*  CREATININE 1.95* 2.15* 2.35*  --  2.16* 2.38*  CALCIUM 8.8* 8.7*  8.9  --  8.6* 9.0  MG 1.9 1.9 2.0 1.9  --  1.9    Liver Function Tests: No results for input(s): AST, ALT, ALKPHOS, BILITOT, PROT, ALBUMIN in the last 168 hours. No results for input(s): LIPASE, AMYLASE in the last 168 hours. No results for input(s): AMMONIA in the last 168 hours. CBC: Recent Labs  Lab 06/05/19 0429  WBC 11.9*  NEUTROABS 7.4  HGB 11.6*  HCT 38.1*  MCV 95.5  PLT 302   Cardiac Enzymes: No results for input(s): CKTOTAL, CKMB, CKMBINDEX, TROPONINI in the last 168 hours. BNP: BNP (last 3 results) Recent Labs    03/21/19 2130 04/13/19 1623 05/12/19 1018  BNP 282.0* 301.3* 198.7*    ProBNP (last 3 results) No results for input(s): PROBNP in the last 8760 hours.  CBG: Recent Labs  Lab 06/04/19 2255 06/05/19 0320 06/05/19 0538 06/05/19 0831 06/05/19 1216  GLUCAP 195* 194* 141* 210* 230*       Signed:  Irine Seal MD.  [facility] 06/05/2019, 1:39 PM

## 2019-06-05 NOTE — TOC Transition Note (Signed)
Transition of Care [facility]) - CM/SW Discharge Note   Patient Details  Name: [name]. MRN: [id number] Date of Birth: 03-17-1961  Transition of Care [facility]) CM/SW Contact:  Geralynn Ochs, LCSW Phone Number: 06/05/2019, 2:11 PM   Clinical Narrative:   Nurse to call report to [phone].    Final next level of care: Skilled Nursing Facility Barriers to Discharge: Barriers Resolved   Patient Goals and CMS Choice        Discharge Placement              Patient chooses bed at: [facility] Patient to be transferred to facility by: [place] Name of family member notified: [name] Patient and family notified of of transfer: 06/05/19  Discharge Plan and Services   Discharge Planning Services: CM Consult Post Acute Care Choice: Durable Medical Equipment, Home Health                               Social Determinants of Health (SDOH) Interventions     Readmission Risk Interventions Readmission Risk Prevention Plan 04/24/2019 04/18/2019 02/24/2019  Transportation Screening Complete Complete Complete  PCP or Specialist Appt within 3-5 Days - - Not Complete  Not Complete comments - - see above  [place] or [place] - - Complete  Social Work Consult for Story Planning/Counseling - - Complete  Palliative Care Screening - - Not Applicable  Medication Review Press photographer) Complete Complete Complete  PCP or Specialist appointment within 3-5 days of discharge (No Data) Patient refused -  [place] or Home Care Consult Complete - -  Palliative Care Screening Complete - -  [place] Not Applicable - -  Some recent data might be hidden

## 2019-06-06 ENCOUNTER — Other Ambulatory Visit: Payer: Self-pay

## 2019-06-06 DIAGNOSIS — I5033 Acute on chronic diastolic (congestive) heart failure: Secondary | ICD-10-CM | POA: Diagnosis not present

## 2019-06-06 DIAGNOSIS — J9621 Acute and chronic respiratory failure with hypoxia: Secondary | ICD-10-CM | POA: Diagnosis not present

## 2019-06-06 DIAGNOSIS — J9622 Acute and chronic respiratory failure with hypercapnia: Secondary | ICD-10-CM | POA: Diagnosis not present

## 2019-06-06 DIAGNOSIS — I1310 Hypertensive heart and chronic kidney disease without heart failure, with stage 1 through stage 4 chronic kidney disease, or unspecified chronic kidney disease: Secondary | ICD-10-CM | POA: Diagnosis not present

## 2019-06-06 NOTE — Patient Outreach (Signed)
  [facility]) Care Management Chronic Special Needs Program  06/06/2019  Name: [name]. DOB: 03-27-1961  MRN: [id number]  [name] is enrolled in a chronic special needs plan. Client discharged to [facility] on 06/05/2019.   Plan: RNCM will continue to follow as client's C-SNP chronic care management coordinator.   Thea Silversmith, RN, MSN, [place] [phone]

## 2019-06-09 DIAGNOSIS — J9622 Acute and chronic respiratory failure with hypercapnia: Secondary | ICD-10-CM | POA: Diagnosis not present

## 2019-06-09 DIAGNOSIS — J9621 Acute and chronic respiratory failure with hypoxia: Secondary | ICD-10-CM | POA: Diagnosis not present

## 2019-06-09 DIAGNOSIS — N179 Acute kidney failure, unspecified: Secondary | ICD-10-CM | POA: Diagnosis not present

## 2019-06-09 DIAGNOSIS — I4891 Unspecified atrial fibrillation: Secondary | ICD-10-CM | POA: Diagnosis not present

## 2019-06-11 ENCOUNTER — Other Ambulatory Visit: Payer: Self-pay

## 2019-06-11 LAB — BLOOD GAS, ARTERIAL
Acid-Base Excess: 7.1 mmol/L — ABNORMAL HIGH (ref 0.0–2.0)
Bicarbonate: 34.8 mmol/L — ABNORMAL HIGH (ref 20.0–28.0)
O2 Content: 3 L/min
O2 Saturation: 90.4 %
Patient temperature: 98.6
pCO2 arterial: 67.2 mmHg (ref 32.0–48.0)
pH, Arterial: 7.334 — ABNORMAL LOW (ref 7.350–7.450)
pO2, Arterial: 57 mmHg — ABNORMAL LOW (ref 83.0–108.0)

## 2019-06-11 NOTE — Patient Outreach (Addendum)
  [facility]) Care Management Chronic Special Needs Program   06/11/2019  Name: Frank Roza., DOB: 1961-07-04  MRN: [id number]  The client was discussed in today's interdisciplinary care team meeting.  The following issues were discussed:  Client's needs, Changes in health status, Care Plan, Coordination of care and Care transitions  Participants present:               Thea Silversmith, MSN, RN, CCM                     Melissa Sandlin RN,BSN,CCM, CDE  Kelli Churn, RN, CCM, CDE Quinn Plowman RN, BSN, CCM            Maryella Shivers, MD            Gilda Crease, PharmD, RPh Bary Castilla, RN, BSN, MS, CCM Coralie Carpen, MD [facility]: Babs Bertin, RN  Plan: [facility] will continue care coordination to assist as needed.  Thea Silversmith, RN, MSN, [place] [phone]    Addendum: Care Coordination with post acute care liaison, A. Hall, RNCM. [name] provided contact information for utilization management that covers facility client is currently residing.

## 2019-06-13 ENCOUNTER — Encounter: Payer: HMO | Admitting: Registered Nurse

## 2019-06-18 ENCOUNTER — Other Ambulatory Visit: Payer: Self-pay

## 2019-06-18 DIAGNOSIS — J9622 Acute and chronic respiratory failure with hypercapnia: Secondary | ICD-10-CM | POA: Diagnosis not present

## 2019-06-18 DIAGNOSIS — R748 Abnormal levels of other serum enzymes: Secondary | ICD-10-CM | POA: Diagnosis not present

## 2019-06-18 DIAGNOSIS — J9621 Acute and chronic respiratory failure with hypoxia: Secondary | ICD-10-CM | POA: Diagnosis not present

## 2019-06-18 DIAGNOSIS — I5033 Acute on chronic diastolic (congestive) heart failure: Secondary | ICD-10-CM | POA: Diagnosis not present

## 2019-06-18 NOTE — Patient Outreach (Addendum)
  [place] [facility]) Care Management Chronic Special Needs Program    06/18/2019  Name: [name]., DOB: Jan 04, 1961  MRN: [id number]   [name] is enrolled in a chronic special needs plan. \  Care Coordination: RNCM called to speak with discharge planner. HIPPA compliant message left with staff taking message regarding plan of care.  Plan: RNCM will continue to follow; [facility] social work consult.  Thea Silversmith, RN, MSN, [place] [phone]  Addendum: Late Entry for 06/19/2019: [place] work referral canceled on 06/19/2019 as client is in skilled facility and facility social worker to assist client/family. Avoid duplication of services.

## 2019-06-22 DIAGNOSIS — I2510 Atherosclerotic heart disease of native coronary artery without angina pectoris: Secondary | ICD-10-CM | POA: Diagnosis not present

## 2019-06-22 DIAGNOSIS — J9611 Chronic respiratory failure with hypoxia: Secondary | ICD-10-CM | POA: Diagnosis not present

## 2019-06-22 DIAGNOSIS — B353 Tinea pedis: Secondary | ICD-10-CM | POA: Diagnosis not present

## 2019-06-22 DIAGNOSIS — M109 Gout, unspecified: Secondary | ICD-10-CM | POA: Diagnosis not present

## 2019-06-22 DIAGNOSIS — I69392 Facial weakness following cerebral infarction: Secondary | ICD-10-CM | POA: Diagnosis not present

## 2019-06-22 DIAGNOSIS — I482 Chronic atrial fibrillation, unspecified: Secondary | ICD-10-CM | POA: Diagnosis not present

## 2019-06-22 DIAGNOSIS — Z7901 Long term (current) use of anticoagulants: Secondary | ICD-10-CM | POA: Diagnosis not present

## 2019-06-22 DIAGNOSIS — Z6841 Body Mass Index (BMI) 40.0 and over, adult: Secondary | ICD-10-CM | POA: Diagnosis not present

## 2019-06-22 DIAGNOSIS — N184 Chronic kidney disease, stage 4 (severe): Secondary | ICD-10-CM | POA: Diagnosis not present

## 2019-06-22 DIAGNOSIS — Z794 Long term (current) use of insulin: Secondary | ICD-10-CM | POA: Diagnosis not present

## 2019-06-22 DIAGNOSIS — I5042 Chronic combined systolic (congestive) and diastolic (congestive) heart failure: Secondary | ICD-10-CM | POA: Diagnosis not present

## 2019-06-22 DIAGNOSIS — I69354 Hemiplegia and hemiparesis following cerebral infarction affecting left non-dominant side: Secondary | ICD-10-CM | POA: Diagnosis not present

## 2019-06-22 DIAGNOSIS — I130 Hypertensive heart and chronic kidney disease with heart failure and stage 1 through stage 4 chronic kidney disease, or unspecified chronic kidney disease: Secondary | ICD-10-CM | POA: Diagnosis not present

## 2019-06-22 DIAGNOSIS — Z9181 History of falling: Secondary | ICD-10-CM | POA: Diagnosis not present

## 2019-06-22 DIAGNOSIS — E1122 Type 2 diabetes mellitus with diabetic chronic kidney disease: Secondary | ICD-10-CM | POA: Diagnosis not present

## 2019-06-22 DIAGNOSIS — M797 Fibromyalgia: Secondary | ICD-10-CM | POA: Diagnosis not present

## 2019-06-22 DIAGNOSIS — Z951 Presence of aortocoronary bypass graft: Secondary | ICD-10-CM | POA: Diagnosis not present

## 2019-06-22 DIAGNOSIS — G4733 Obstructive sleep apnea (adult) (pediatric): Secondary | ICD-10-CM | POA: Diagnosis not present

## 2019-06-22 DIAGNOSIS — J9612 Chronic respiratory failure with hypercapnia: Secondary | ICD-10-CM | POA: Diagnosis not present

## 2019-06-22 DIAGNOSIS — S81811D Laceration without foreign body, right lower leg, subsequent encounter: Secondary | ICD-10-CM | POA: Diagnosis not present

## 2019-06-22 DIAGNOSIS — S81812D Laceration without foreign body, left lower leg, subsequent encounter: Secondary | ICD-10-CM | POA: Diagnosis not present

## 2019-06-22 DIAGNOSIS — Z9981 Dependence on supplemental oxygen: Secondary | ICD-10-CM | POA: Diagnosis not present

## 2019-06-23 ENCOUNTER — Other Ambulatory Visit: Payer: Self-pay

## 2019-06-23 NOTE — Patient Outreach (Signed)
  [facility]) Care Management Chronic Special Needs Program    06/23/2019  Name: Cason Luffman., DOB: 1961/07/30  MRN: [id number]   Mr. Keiandre Cygan is enrolled in a chronic special needs plan. RNCM received notification that client has discharged from skilled facility [facility] to home with home health.  Plan: utilization management following for transition of care. RNCM will continue to follow as client's C-SNP care management coordinator. [facility] community care management not involved yet as client declined in the past. [facility] to attempt to engage client again.  Thea Silversmith, RN, MSN, [place] [phone]

## 2019-06-24 DIAGNOSIS — J449 Chronic obstructive pulmonary disease, unspecified: Secondary | ICD-10-CM | POA: Diagnosis not present

## 2019-06-24 DIAGNOSIS — E662 Morbid (severe) obesity with alveolar hypoventilation: Secondary | ICD-10-CM | POA: Diagnosis not present

## 2019-06-25 ENCOUNTER — Encounter (HOSPITAL_COMMUNITY): Payer: Self-pay | Admitting: Emergency Medicine

## 2019-06-25 ENCOUNTER — Other Ambulatory Visit: Payer: Self-pay

## 2019-06-25 ENCOUNTER — Inpatient Hospital Stay (HOSPITAL_COMMUNITY): Payer: HMO

## 2019-06-25 ENCOUNTER — Emergency Department (HOSPITAL_COMMUNITY): Payer: HMO

## 2019-06-25 ENCOUNTER — Inpatient Hospital Stay (HOSPITAL_COMMUNITY)
Admission: EM | Admit: 2019-06-25 | Discharge: 2019-07-04 | DRG: 208 | Disposition: A | Discharge status: Home Health | Payer: HMO | Attending: Internal Medicine | Admitting: Internal Medicine

## 2019-06-25 DIAGNOSIS — I252 Old myocardial infarction: Secondary | ICD-10-CM | POA: Diagnosis not present

## 2019-06-25 DIAGNOSIS — J449 Chronic obstructive pulmonary disease, unspecified: Secondary | ICD-10-CM

## 2019-06-25 DIAGNOSIS — Z9114 Patient's other noncompliance with medication regimen: Secondary | ICD-10-CM

## 2019-06-25 DIAGNOSIS — Y95 Nosocomial condition: Secondary | ICD-10-CM | POA: Diagnosis present

## 2019-06-25 DIAGNOSIS — M797 Fibromyalgia: Secondary | ICD-10-CM | POA: Diagnosis not present

## 2019-06-25 DIAGNOSIS — Z794 Long term (current) use of insulin: Secondary | ICD-10-CM

## 2019-06-25 DIAGNOSIS — D631 Anemia in chronic kidney disease: Secondary | ICD-10-CM | POA: Diagnosis present

## 2019-06-25 DIAGNOSIS — J189 Pneumonia, unspecified organism: Secondary | ICD-10-CM | POA: Diagnosis present

## 2019-06-25 DIAGNOSIS — E1165 Type 2 diabetes mellitus with hyperglycemia: Secondary | ICD-10-CM | POA: Diagnosis present

## 2019-06-25 DIAGNOSIS — Z885 Allergy status to narcotic agent status: Secondary | ICD-10-CM

## 2019-06-25 DIAGNOSIS — E876 Hypokalemia: Secondary | ICD-10-CM | POA: Diagnosis not present

## 2019-06-25 DIAGNOSIS — G4733 Obstructive sleep apnea (adult) (pediatric): Secondary | ICD-10-CM | POA: Diagnosis present

## 2019-06-25 DIAGNOSIS — I2510 Atherosclerotic heart disease of native coronary artery without angina pectoris: Secondary | ICD-10-CM | POA: Diagnosis present

## 2019-06-25 DIAGNOSIS — N179 Acute kidney failure, unspecified: Secondary | ICD-10-CM | POA: Diagnosis not present

## 2019-06-25 DIAGNOSIS — Z91041 Radiographic dye allergy status: Secondary | ICD-10-CM

## 2019-06-25 DIAGNOSIS — I5042 Chronic combined systolic (congestive) and diastolic (congestive) heart failure: Secondary | ICD-10-CM | POA: Diagnosis not present

## 2019-06-25 DIAGNOSIS — E875 Hyperkalemia: Secondary | ICD-10-CM | POA: Diagnosis not present

## 2019-06-25 DIAGNOSIS — J9602 Acute respiratory failure with hypercapnia: Secondary | ICD-10-CM | POA: Diagnosis not present

## 2019-06-25 DIAGNOSIS — G8929 Other chronic pain: Secondary | ICD-10-CM | POA: Diagnosis not present

## 2019-06-25 DIAGNOSIS — J810 Acute pulmonary edema: Secondary | ICD-10-CM

## 2019-06-25 DIAGNOSIS — J9622 Acute and chronic respiratory failure with hypercapnia: Secondary | ICD-10-CM | POA: Diagnosis present

## 2019-06-25 DIAGNOSIS — Z833 Family history of diabetes mellitus: Secondary | ICD-10-CM

## 2019-06-25 DIAGNOSIS — I130 Hypertensive heart and chronic kidney disease with heart failure and stage 1 through stage 4 chronic kidney disease, or unspecified chronic kidney disease: Secondary | ICD-10-CM | POA: Diagnosis present

## 2019-06-25 DIAGNOSIS — I5033 Acute on chronic diastolic (congestive) heart failure: Secondary | ICD-10-CM | POA: Diagnosis not present

## 2019-06-25 DIAGNOSIS — E1122 Type 2 diabetes mellitus with diabetic chronic kidney disease: Secondary | ICD-10-CM | POA: Diagnosis not present

## 2019-06-25 DIAGNOSIS — Z66 Do not resuscitate: Secondary | ICD-10-CM | POA: Diagnosis present

## 2019-06-25 DIAGNOSIS — R600 Localized edema: Secondary | ICD-10-CM | POA: Diagnosis not present

## 2019-06-25 DIAGNOSIS — Z6833 Body mass index (BMI) 33.0-33.9, adult: Secondary | ICD-10-CM

## 2019-06-25 DIAGNOSIS — Z951 Presence of aortocoronary bypass graft: Secondary | ICD-10-CM

## 2019-06-25 DIAGNOSIS — E8770 Fluid overload, unspecified: Secondary | ICD-10-CM | POA: Diagnosis not present

## 2019-06-25 DIAGNOSIS — Z9989 Dependence on other enabling machines and devices: Secondary | ICD-10-CM | POA: Diagnosis not present

## 2019-06-25 DIAGNOSIS — D539 Nutritional anemia, unspecified: Secondary | ICD-10-CM | POA: Diagnosis not present

## 2019-06-25 DIAGNOSIS — Z9289 Personal history of other medical treatment: Secondary | ICD-10-CM

## 2019-06-25 DIAGNOSIS — Z20828 Contact with and (suspected) exposure to other viral communicable diseases: Secondary | ICD-10-CM | POA: Diagnosis present

## 2019-06-25 DIAGNOSIS — I959 Hypotension, unspecified: Secondary | ICD-10-CM | POA: Diagnosis not present

## 2019-06-25 DIAGNOSIS — I480 Paroxysmal atrial fibrillation: Secondary | ICD-10-CM | POA: Diagnosis not present

## 2019-06-25 DIAGNOSIS — R0602 Shortness of breath: Secondary | ICD-10-CM | POA: Diagnosis not present

## 2019-06-25 DIAGNOSIS — Z79899 Other long term (current) drug therapy: Secondary | ICD-10-CM

## 2019-06-25 DIAGNOSIS — E1140 Type 2 diabetes mellitus with diabetic neuropathy, unspecified: Secondary | ICD-10-CM | POA: Diagnosis present

## 2019-06-25 DIAGNOSIS — N184 Chronic kidney disease, stage 4 (severe): Secondary | ICD-10-CM | POA: Diagnosis present

## 2019-06-25 DIAGNOSIS — J9621 Acute and chronic respiratory failure with hypoxia: Principal | ICD-10-CM | POA: Diagnosis present

## 2019-06-25 DIAGNOSIS — B961 Klebsiella pneumoniae [K. pneumoniae] as the cause of diseases classified elsewhere: Secondary | ICD-10-CM | POA: Diagnosis present

## 2019-06-25 DIAGNOSIS — E669 Obesity, unspecified: Secondary | ICD-10-CM | POA: Diagnosis present

## 2019-06-25 DIAGNOSIS — J9601 Acute respiratory failure with hypoxia: Secondary | ICD-10-CM | POA: Diagnosis not present

## 2019-06-25 DIAGNOSIS — Z8249 Family history of ischemic heart disease and other diseases of the circulatory system: Secondary | ICD-10-CM

## 2019-06-25 DIAGNOSIS — R579 Shock, unspecified: Secondary | ICD-10-CM | POA: Diagnosis not present

## 2019-06-25 DIAGNOSIS — I5031 Acute diastolic (congestive) heart failure: Secondary | ICD-10-CM

## 2019-06-25 DIAGNOSIS — J441 Chronic obstructive pulmonary disease with (acute) exacerbation: Secondary | ICD-10-CM | POA: Diagnosis not present

## 2019-06-25 DIAGNOSIS — Z9119 Patient's noncompliance with other medical treatment and regimen: Secondary | ICD-10-CM

## 2019-06-25 DIAGNOSIS — I9581 Postprocedural hypotension: Secondary | ICD-10-CM | POA: Diagnosis not present

## 2019-06-25 DIAGNOSIS — G9340 Encephalopathy, unspecified: Secondary | ICD-10-CM | POA: Diagnosis not present

## 2019-06-25 DIAGNOSIS — Z4682 Encounter for fitting and adjustment of non-vascular catheter: Secondary | ICD-10-CM | POA: Diagnosis not present

## 2019-06-25 DIAGNOSIS — J440 Chronic obstructive pulmonary disease with acute lower respiratory infection: Secondary | ICD-10-CM | POA: Diagnosis not present

## 2019-06-25 DIAGNOSIS — Z9981 Dependence on supplemental oxygen: Secondary | ICD-10-CM

## 2019-06-25 DIAGNOSIS — Z7189 Other specified counseling: Secondary | ICD-10-CM | POA: Diagnosis not present

## 2019-06-25 DIAGNOSIS — R918 Other nonspecific abnormal finding of lung field: Secondary | ICD-10-CM | POA: Diagnosis not present

## 2019-06-25 DIAGNOSIS — Z7901 Long term (current) use of anticoagulants: Secondary | ICD-10-CM

## 2019-06-25 DIAGNOSIS — I5032 Chronic diastolic (congestive) heart failure: Secondary | ICD-10-CM

## 2019-06-25 DIAGNOSIS — Z720 Tobacco use: Secondary | ICD-10-CM

## 2019-06-25 DIAGNOSIS — Z515 Encounter for palliative care: Secondary | ICD-10-CM | POA: Diagnosis not present

## 2019-06-25 LAB — BASIC METABOLIC PANEL
Anion gap: 10 (ref 5–15)
Anion gap: 9 (ref 5–15)
BUN: 42 mg/dL — ABNORMAL HIGH (ref 6–20)
BUN: 45 mg/dL — ABNORMAL HIGH (ref 6–20)
CO2: 31 mmol/L (ref 22–32)
CO2: 25 mmol/L (ref 22–32)
Calcium: 9.3 mg/dL (ref 8.9–10.3)
Calcium: 8.7 mg/dL — ABNORMAL LOW (ref 8.9–10.3)
Chloride: 103 mmol/L (ref 98–111)
Chloride: 106 mmol/L (ref 98–111)
Creatinine, Ser: 2.28 mg/dL — ABNORMAL HIGH (ref 0.61–1.24)
Creatinine, Ser: 2.44 mg/dL — ABNORMAL HIGH (ref 0.61–1.24)
GFR calc Af Amer: 35 mL/min — ABNORMAL LOW (ref >60)
GFR calc Af Amer: 33 mL/min — ABNORMAL LOW (ref >60)
GFR calc non Af Amer: 30 mL/min — ABNORMAL LOW (ref >60)
GFR calc non Af Amer: 28 mL/min — ABNORMAL LOW (ref >60)
Glucose, Bld: 182 mg/dL — ABNORMAL HIGH (ref 70–99)
Glucose, Bld: 212 mg/dL — ABNORMAL HIGH (ref 70–99)
Potassium: 5.2 mmol/L — ABNORMAL HIGH (ref 3.5–5.1)
Potassium: 5.9 mmol/L — ABNORMAL HIGH (ref 3.5–5.1)
Sodium: 144 mmol/L (ref 135–145)
Sodium: 140 mmol/L (ref 135–145)

## 2019-06-25 LAB — RESPIRATORY PANEL BY PCR

## 2019-06-25 LAB — CBC WITH DIFFERENTIAL/PLATELET
Abs Immature Granulocytes: 0.08 10*3/uL — ABNORMAL HIGH (ref 0.00–0.07)
Basophils Absolute: 0.1 10*3/uL (ref 0.0–0.1)
Basophils Relative: 1 %
Eosinophils Absolute: 0.5 10*3/uL (ref 0.0–0.5)
Eosinophils Relative: 6 %
HCT: 43.2 % (ref 39.0–52.0)
Hemoglobin: 11.8 g/dL — ABNORMAL LOW (ref 13.0–17.0)
Immature Granulocytes: 1 %
Lymphocytes Relative: 16 %
Lymphs Abs: 1.5 10*3/uL (ref 0.7–4.0)
MCH: 28.6 pg (ref 26.0–34.0)
MCHC: 27.3 g/dL — ABNORMAL LOW (ref 30.0–36.0)
MCV: 104.6 fL — ABNORMAL HIGH (ref 80.0–100.0)
Monocytes Absolute: 1 10*3/uL (ref 0.1–1.0)
Monocytes Relative: 11 %
Neutro Abs: 6.4 10*3/uL (ref 1.7–7.7)
Neutrophils Relative %: 65 %
Platelets: 201 10*3/uL (ref 150–400)
RBC: 4.13 MIL/uL — ABNORMAL LOW (ref 4.22–5.81)
RDW: 14.5 % (ref 11.5–15.5)
WBC: 9.7 10*3/uL (ref 4.0–10.5)
nRBC: 0.2 % (ref 0.0–0.2)

## 2019-06-25 LAB — BLOOD GAS, ARTERIAL
Acid-Base Excess: 6.4 mmol/L — ABNORMAL HIGH (ref 0.0–2.0)
Bicarbonate: 35.8 mmol/L — ABNORMAL HIGH (ref 20.0–28.0)
Drawn by: 39899
FIO2: 100
O2 Saturation: 97.1 %
Patient temperature: 36.6
pCO2 arterial: 117 mmHg (ref 32.0–48.0)
pH, Arterial: 7.109 — CL (ref 7.350–7.450)
pO2, Arterial: 107 mmHg (ref 83.0–108.0)

## 2019-06-25 LAB — POCT I-STAT 7, (LYTES, BLD GAS, ICA,H+H)
Acid-Base Excess: 6 mmol/L — ABNORMAL HIGH (ref 0.0–2.0)
Bicarbonate: 34.7 mmol/L — ABNORMAL HIGH (ref 20.0–28.0)
Calcium, Ion: 1.22 mmol/L (ref 1.15–1.40)
HCT: 38 % — ABNORMAL LOW (ref 39.0–52.0)
Hemoglobin: 12.9 g/dL — ABNORMAL LOW (ref 13.0–17.0)
O2 Saturation: 85 %
Patient temperature: 98.6
Potassium: 5.3 mmol/L — ABNORMAL HIGH (ref 3.5–5.1)
Sodium: 142 mmol/L (ref 135–145)
TCO2: 37 mmol/L — ABNORMAL HIGH (ref 22–32)
pCO2 arterial: 68.8 mmHg (ref 32.0–48.0)
pH, Arterial: 7.31 — ABNORMAL LOW (ref 7.350–7.450)
pO2, Arterial: 57 mmHg — ABNORMAL LOW (ref 83.0–108.0)

## 2019-06-25 LAB — TROPONIN I (HIGH SENSITIVITY)
Troponin I (High Sensitivity): 10 ng/L (ref <18)
Troponin I (High Sensitivity): 9 ng/L (ref <18)

## 2019-06-25 LAB — LACTIC ACID, PLASMA
Lactic Acid, Venous: 1.3 mmol/L (ref 0.5–1.9)
Lactic Acid, Venous: 2.2 mmol/L (ref 0.5–1.9)
Lactic Acid, Venous: 3.1 mmol/L (ref 0.5–1.9)

## 2019-06-25 LAB — URINALYSIS, ROUTINE W REFLEX MICROSCOPIC
Bilirubin Urine: NEGATIVE
Glucose, UA: NEGATIVE mg/dL
Ketones, ur: NEGATIVE mg/dL
Nitrite: POSITIVE — AB
Protein, ur: 30 mg/dL — AB
Specific Gravity, Urine: 1.01 (ref 1.005–1.030)
WBC, UA: >50 WBC/hpf — ABNORMAL HIGH (ref 0–5)
pH: 5 (ref 5.0–8.0)

## 2019-06-25 LAB — CBG MONITORING, ED: Glucose-Capillary: 218 mg/dL — ABNORMAL HIGH (ref 70–99)

## 2019-06-25 LAB — PROCALCITONIN: Procalcitonin: 0.1 ng/mL

## 2019-06-25 LAB — ECHOCARDIOGRAM LIMITED
Height: 71 in
Weight: 3703.73 oz

## 2019-06-25 LAB — GLUCOSE, CAPILLARY
Glucose-Capillary: 202 mg/dL — ABNORMAL HIGH (ref 70–99)
Glucose-Capillary: 197 mg/dL — ABNORMAL HIGH (ref 70–99)
Glucose-Capillary: 175 mg/dL — ABNORMAL HIGH (ref 70–99)

## 2019-06-25 LAB — SARS CORONAVIRUS 2 BY RT PCR (HOSPITAL ORDER, PERFORMED IN ~~LOC~~ HOSPITAL LAB): SARS Coronavirus 2: NEGATIVE

## 2019-06-25 LAB — STREP PNEUMONIAE URINARY ANTIGEN: Strep Pneumo Urinary Antigen: NEGATIVE

## 2019-06-25 LAB — MRSA PCR SCREENING: MRSA by PCR: NEGATIVE

## 2019-06-25 LAB — BRAIN NATRIURETIC PEPTIDE: B Natriuretic Peptide: 149.8 pg/mL — ABNORMAL HIGH (ref 0.0–100.0)

## 2019-06-25 MED ORDER — IPRATROPIUM-ALBUTEROL 0.5-2.5 (3) MG/3ML IN SOLN
3.0000 mL | Freq: Once | RESPIRATORY_TRACT | Status: AC
  Administered 2019-06-25: 3 mL via RESPIRATORY_TRACT
  Filled 2019-06-25: qty 3

## 2019-06-25 MED ORDER — APIXABAN 5 MG PO TABS
5.0000 mg | ORAL_TABLET | Freq: Two times a day (BID) | ORAL | Status: DC
  Administered 2019-06-25 – 2019-07-04 (×17): 5 mg via ORAL
  Filled 2019-06-25 (×17): qty 1

## 2019-06-25 MED ORDER — FENTANYL 2500MCG IN NS 250ML (10MCG/ML) PREMIX INFUSION
0.0000 ug/h | INTRAVENOUS | Status: DC
  Administered 2019-06-25: 25 ug/h via INTRAVENOUS
  Administered 2019-06-26: 175 ug/h via INTRAVENOUS
  Administered 2019-06-27: 100 ug/h via INTRAVENOUS
  Filled 2019-06-25 (×3): qty 250

## 2019-06-25 MED ORDER — VANCOMYCIN HCL IN DEXTROSE 1-5 GM/200ML-% IV SOLN
1000.0000 mg | INTRAVENOUS | Status: DC
  Filled 2019-06-25 (×2): qty 200

## 2019-06-25 MED ORDER — ROCURONIUM BROMIDE 50 MG/5ML IV SOLN
100.0000 mg | Freq: Once | INTRAVENOUS | Status: AC
  Administered 2019-06-25: 100 mg via INTRAVENOUS
  Filled 2019-06-25: qty 10

## 2019-06-25 MED ORDER — CHLORHEXIDINE GLUCONATE CLOTH 2 % EX PADS
6.0000 | MEDICATED_PAD | Freq: Every day | CUTANEOUS | Status: DC
  Administered 2019-06-26 – 2019-07-04 (×8): 6 via TOPICAL

## 2019-06-25 MED ORDER — METHYLPREDNISOLONE SODIUM SUCC 125 MG IJ SOLR
125.0000 mg | Freq: Once | INTRAMUSCULAR | Status: AC
  Administered 2019-06-25: 125 mg via INTRAVENOUS
  Filled 2019-06-25: qty 2

## 2019-06-25 MED ORDER — SODIUM CHLORIDE 0.9 % IV BOLUS (SEPSIS)
250.0000 mL | Freq: Once | INTRAVENOUS | Status: AC
  Administered 2019-06-25: 250 mL via INTRAVENOUS

## 2019-06-25 MED ORDER — PANTOPRAZOLE SODIUM 40 MG PO TBEC: 40.0000 mg | DELAYED_RELEASE_TABLET | Freq: Every day | ORAL | Status: DC

## 2019-06-25 MED ORDER — ETOMIDATE 2 MG/ML IV SOLN
24.0000 mg | Freq: Once | INTRAVENOUS | Status: AC
  Administered 2019-06-25: 24 mg via INTRAVENOUS
  Filled 2019-06-25: qty 20

## 2019-06-25 MED ORDER — TOPIRAMATE 100 MG PO TABS
100.0000 mg | ORAL_TABLET | Freq: Every day | ORAL | Status: DC
  Administered 2019-06-25 – 2019-07-03 (×8): 100 mg via ORAL
  Filled 2019-06-25 (×3): qty 1
  Filled 2019-06-25: qty 4
  Filled 2019-06-25 (×2): qty 1
  Filled 2019-06-25: qty 4
  Filled 2019-06-25: qty 1
  Filled 2019-06-25 (×2): qty 4

## 2019-06-25 MED ORDER — PHENYLEPHRINE 40 MCG/ML (10ML) SYRINGE FOR IV PUSH (FOR BLOOD PRESSURE SUPPORT)
PREFILLED_SYRINGE | INTRAVENOUS | Status: AC
  Administered 2019-06-25: 100 ug
  Filled 2019-06-25: qty 10

## 2019-06-25 MED ORDER — PROPOFOL 1000 MG/100ML IV EMUL
5.0000 ug/kg/min | INTRAVENOUS | Status: DC
  Administered 2019-06-25 (×2): 5 ug/kg/min via INTRAVENOUS
  Filled 2019-06-25: qty 100

## 2019-06-25 MED ORDER — PANTOPRAZOLE SODIUM 40 MG IV SOLR
40.0000 mg | Freq: Every day | INTRAVENOUS | Status: DC
  Administered 2019-06-25 – 2019-06-30 (×5): 40 mg via INTRAVENOUS
  Filled 2019-06-25 (×7): qty 40

## 2019-06-25 MED ORDER — PHENYLEPHRINE HCL-NACL 10-0.9 MG/250ML-% IV SOLN
INTRAVENOUS | Status: AC
  Filled 2019-06-25: qty 250

## 2019-06-25 MED ORDER — PHENYLEPHRINE HCL (PRESSORS) 10 MG/ML IV SOLN
INTRAVENOUS | Status: DC | PRN
  Administered 2019-06-25: 100 ug
  Administered 2019-06-25: 25 mg via INTRAVENOUS

## 2019-06-25 MED ORDER — ALBUTEROL SULFATE HFA 108 (90 BASE) MCG/ACT IN AERS
8.0000 | INHALATION_SPRAY | Freq: Once | RESPIRATORY_TRACT | Status: DC
  Filled 2019-06-25: qty 6.7

## 2019-06-25 MED ORDER — IPRATROPIUM-ALBUTEROL 0.5-2.5 (3) MG/3ML IN SOLN
RESPIRATORY_TRACT | Status: AC
  Administered 2019-06-25: 1 mL
  Filled 2019-06-25: qty 3

## 2019-06-25 MED ORDER — IPRATROPIUM-ALBUTEROL 0.5-2.5 (3) MG/3ML IN SOLN: 3.0000 mL | RESPIRATORY_TRACT | Status: DC | PRN

## 2019-06-25 MED ORDER — SODIUM CHLORIDE 0.9 % IV SOLN
2.0000 g | Freq: Once | INTRAVENOUS | Status: AC
  Administered 2019-06-25: 2 g via INTRAVENOUS
  Filled 2019-06-25: qty 2

## 2019-06-25 MED ORDER — FUROSEMIDE 10 MG/ML IJ SOLN
80.0000 mg | Freq: Once | INTRAMUSCULAR | Status: AC
  Administered 2019-06-25: 80 mg via INTRAVENOUS
  Filled 2019-06-25: qty 8

## 2019-06-25 MED ORDER — METHYLPREDNISOLONE SODIUM SUCC 40 MG IJ SOLR
40.0000 mg | Freq: Four times a day (QID) | INTRAMUSCULAR | Status: DC
  Administered 2019-06-25 – 2019-06-28 (×11): 40 mg via INTRAVENOUS
  Filled 2019-06-25 (×12): qty 1

## 2019-06-25 MED ORDER — SODIUM CHLORIDE 0.9 % IV SOLN
2.0000 g | Freq: Two times a day (BID) | INTRAVENOUS | Status: DC
  Administered 2019-06-25 – 2019-06-27 (×5): 2 g via INTRAVENOUS
  Filled 2019-06-25 (×5): qty 2

## 2019-06-25 MED ORDER — IPRATROPIUM BROMIDE HFA 17 MCG/ACT IN AERS
2.0000 | INHALATION_SPRAY | Freq: Once | RESPIRATORY_TRACT | Status: DC
  Filled 2019-06-25: qty 12.9

## 2019-06-25 MED ORDER — VANCOMYCIN HCL 10 G IV SOLR
2500.0000 mg | Freq: Once | INTRAVENOUS | Status: AC
  Administered 2019-06-25: 2500 mg via INTRAVENOUS
  Filled 2019-06-25 (×2): qty 2500

## 2019-06-25 MED ORDER — NOREPINEPHRINE BITARTRATE 1 MG/ML IV SOLN: INTRAVENOUS | Status: DC | PRN

## 2019-06-25 MED ORDER — SODIUM CHLORIDE 0.9 % IV BOLUS
500.0000 mL | Freq: Once | INTRAVENOUS | Status: AC
  Administered 2019-06-25: 500 mL via INTRAVENOUS

## 2019-06-25 MED ORDER — VANCOMYCIN HCL 10 G IV SOLR
2250.0000 mg | Freq: Once | INTRAVENOUS | Status: DC
  Filled 2019-06-25: qty 2250

## 2019-06-25 MED ORDER — DEXTROSE 5 % IV SOLN
250.0000 mg | INTRAVENOUS | Status: DC
  Administered 2019-06-26 – 2019-06-27 (×2): 250 mg via INTRAVENOUS
  Filled 2019-06-25 (×4): qty 250

## 2019-06-25 MED ORDER — PHENYLEPHRINE HCL-NACL 10-0.9 MG/250ML-% IV SOLN
0.0000 ug/min | INTRAVENOUS | Status: DC
  Administered 2019-06-25: 20 ug/min via INTRAVENOUS

## 2019-06-25 MED ORDER — ORAL CARE MOUTH RINSE
15.0000 mL | OROMUCOSAL | Status: DC
  Administered 2019-06-25 – 2019-06-27 (×21): 15 mL via OROMUCOSAL

## 2019-06-25 MED ORDER — AEROCHAMBER PLUS FLO-VU LARGE MISC: 1.0000 | Freq: Once | Status: DC

## 2019-06-25 MED ORDER — CHLORHEXIDINE GLUCONATE 0.12% ORAL RINSE (MEDLINE KIT)
15.0000 mL | Freq: Two times a day (BID) | OROMUCOSAL | Status: DC
  Administered 2019-06-25 – 2019-06-27 (×5): 15 mL via OROMUCOSAL

## 2019-06-25 MED ORDER — SODIUM CHLORIDE 0.9 % IV SOLN
500.0000 mg | Freq: Once | INTRAVENOUS | Status: AC
  Administered 2019-06-25: 500 mg via INTRAVENOUS
  Filled 2019-06-25: qty 500

## 2019-06-25 MED ORDER — INSULIN ASPART 100 UNIT/ML ~~LOC~~ SOLN
1.0000 [IU] | SUBCUTANEOUS | Status: DC
  Administered 2019-06-25: 3 [IU] via SUBCUTANEOUS
  Administered 2019-06-25: 2 [IU] via SUBCUTANEOUS
  Administered 2019-06-25: 3 [IU] via SUBCUTANEOUS
  Administered 2019-06-25: 2 [IU] via SUBCUTANEOUS
  Administered 2019-06-26: 3 [IU] via SUBCUTANEOUS
  Administered 2019-06-26 (×3): 2 [IU] via SUBCUTANEOUS

## 2019-06-25 MED ORDER — TIZANIDINE HCL 2 MG PO TABS
4.0000 mg | ORAL_TABLET | Freq: Four times a day (QID) | ORAL | Status: DC | PRN
  Administered 2019-06-25: 4 mg via ORAL
  Filled 2019-06-25 (×3): qty 1

## 2019-06-25 MED ORDER — SODIUM CHLORIDE 0.9 % IV BOLUS
1000.0000 mL | Freq: Once | INTRAVENOUS | Status: AC
  Administered 2019-06-25: 1000 mL via INTRAVENOUS

## 2019-06-25 MED ORDER — AMIODARONE HCL 200 MG PO TABS
100.0000 mg | ORAL_TABLET | Freq: Every day | ORAL | Status: DC
  Administered 2019-06-26 – 2019-07-04 (×9): 100 mg via ORAL
  Filled 2019-06-25 (×9): qty 1

## 2019-06-25 NOTE — Progress Notes (Signed)
Arrived to ED, informed by nurse IV Team is no longer needed.

## 2019-06-25 NOTE — Patient Outreach (Addendum)
  [place] [facility]) Care Management Chronic Special Needs Program    06/25/2019  Name: Frank Hufstetler., DOB: 1961-07-01  MRN: [id number]   [name] is enrolled in a chronic special needs plan. 58 year old with multiple comorbidities. Client recently left [facility] on 06/19/2019. Client presented to hospital today with  with readmission to hospital with acute on chronic respiratory failure with hypoxia and hypercapnia-Intubated. Per policy and procedure, RNCM will send care plan to inpatient utilization management team at [facility].   Plan: RNCM will continue to follow, care coordination with hospital liaison.  Thea Silversmith, RN, MSN, [place] [phone]

## 2019-06-25 NOTE — Patient Outreach (Addendum)
  [facility]) Care Management Chronic Special Needs Program   06/25/2019  Name: Kitai Purdom., DOB: [birth year]  MRN: [id number]  The client was discussed in today's interdisciplinary care team meeting.  The following issues were discussed:  Client's needs, Changes in health status, Care Plan, Coordination of care, Care transitions and Issues/barriers to care  Participants present:                   Thea Silversmith, MSN, RN, CCM                     Melissa Sandlin RN,BSN,CCM, CDE  Kelli Churn, RN, CCM, CDE Quinn Plowman RN, BSN, CCM            Marco Collie, MD Maryella Shivers, MD            Gilda Crease, PharmD, RPh Bary Castilla, RN, BSN, MS, CCM Coralie Carpen, MD [facility]: Babs Bertin, RN; Lestine Box, RN  [facility] called hospital liaison regarding client's admission today. Hospital Liasion to follow client while hospitalized. RNCM will continue to follow.   Thea Silversmith, RN, MSN, [place] [phone]   06/30/2019 10:02-Addendum: Late Entry for 06/25/2019 0912 Care Coordination: RNCM spoke with [facility] Nurse Practitoner, Kayleen Memos Spinks-discussed client's recent transition from rehab to home; utilization management following for transition of care; [facility] to engage client(complex community care management. Also discussed client's presenting to Emergency room/hospital today(06/25/2019). Ms. Myrtie Neither reports client could benefit from engagement with [facility]. Ms. Myrtie Neither offers assistance as needed. RNCM continue to follow hospital course and care coordinate with Hospital liaison/inpatient case management as needed.

## 2019-06-25 NOTE — Progress Notes (Signed)
Pts second lactic elevated: 2.2.  Relayed to CCM MD.  Received verbal orders for repeat LA at 2000 and Bolus over 3 hrs.

## 2019-06-25 NOTE — Progress Notes (Signed)
[facility] Progress Note Patient Name: [name]. DOB: 1961/05/24 MRN: [id number]   Date of Service  06/25/2019  HPI/Events of Note  Multiple issues: Hypotension - BP =75/48 with MAP + 56 and 2. Lactic Acid = 3.1. Unable to get cardiac echo today d/t body habitus. No CVL or CVP available.  eICU Interventions  Will order: 1. Bolus with 0.9 NaCl 1 liter IV over 1 hour now.  2. Phenylephrine IV infusion. Titrate to MAP >= 65.      Intervention Category Major Interventions: Hypotension - evaluation and management  Sohana Austell [place] 06/25/2019, 9:18 PM

## 2019-06-25 NOTE — Progress Notes (Signed)
CRITICAL VALUE ALERT  Critical Value:  Lactic acid 3.1  Date & Time Notified:  06/25/19 9:00 PM  Provider Notified: Warren Lacy

## 2019-06-25 NOTE — Code Documentation (Signed)
Vital signs stable. 

## 2019-06-25 NOTE — H&P (Signed)
NAME:  Frank Reap., MRN:  [id number], DOB:  [birth year], LOS: 0 ADMISSION DATE:  06/25/2019, CONSULTATION DATE:  06/25/19 REFERRING MD:  Dr Wilson Singer, CHIEF COMPLAINT:  sob  Brief History   58 yo with h/o COPD/CHF presented with SOB x3 days req. Intubation.  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 CCM has been called to admit pt.   Pt was seen intubated and sedated. Per ED physician but was sob and altered, NIV was intitiated but pt cont to worsen and was more lethargic. He was intubated. After intubation pt became hypotensive req neo stick infusion. He was diffusely wheezing given bolus steroid in ed as neb started.    Past Medical History   Past Medical History:  Diagnosis Date  . Brain tumor ([place])    Takes Topamax so patient will not have headaches  . Bruises easily   . CAD (coronary artery disease)    Cath 09, 99% LAD  . Cardiomyopathy, ischemic    Ischemic  . CHF (congestive heart failure) ([place])   . Cyst near coccyx   . Cyst near tailbone   . Diabetes mellitus without complication (HCC)    borderline  . Edema of both legs    Takes Lasix  . Fibromyalgia   . Gout   . Headache(784.0)    with lights  . Hypertension    dr Percival Spanish  . Kidney stones   . Myocardial infarction ([place])   . Obesity   . Pneumonia    hx of  . Shortness of breath   . Stroke [facility])    Affected vision, walking, and facial drooping on right face    Significant Hospital Events    11/4: intubated  Consults:    Procedures:  ett 11->4  Significant Diagnostic Tests:    Micro Data:  resp 11/4 Blood 11/4 rvp 11/4 sars2 11/4-> neg  Antimicrobials:  vanc 11/4-> Cefepime 11/4->  Interim history/subjective:  11/4: pt was  intubated and sedated today after presentation.   Objective   Blood pressure 137/71, pulse 63, resp. rate (!) 24, height _0  (1.803 m), weight 105 kg, SpO2 96 %.    Vent Mode: PRVC FiO2 (%):  [80 %-100 %] 100 % Set Rate:  [15 bmp-24 bmp] 24 bmp Vt Set:  [560 mL-600 mL] 600 mL PEEP:  [5 cmH20-6 cmH20] 5 cmH20 Plateau Pressure:  [33 cmH20] 33 cmH20   Intake/Output Summary (Last 24 hours) at 06/25/2019 1247 Last data filed at 06/25/2019 1225 Gross per 24 hour  Intake 250 ml  Output 340 ml  Net -90 ml   Filed Weights   06/25/19 0819  Weight: 105 kg    Examination: General: no acute distress, sedated and intubated.  HEENT: NCAT,  PERRLA, MMMP Lungs: diffusely wheezing with minimal air movement on exam  Cardiovascular: irreg, no m/g/r Abdomen: OBESE, soft, NT, mildly distended, BS+ Extremities: + edema diffuse worse in ble Skin: no rashes, warm and dry Neuro: sedated unresponsive s/p intubation   Resolved Hospital Problem list     Assessment & Plan:  Acute on chronic hypoxic/hypercarbic resp failure:  -2/2 copd exacerbation with acute suspected pna poa -titrate vent -bronchodilators -steroids -rvp,resp cx, strep/leg for completeness sake.  -empiric abx, with  low threshold to de-escalate based on cx and clinical course -on 2-3L [place] at baseline -cxr personally reviewed with bilateral edema and infiltrates Osa:  -non adherent to NIV at baseline -will need when extubated COPD with acute exacerbation:  -as above Pna, suspected gp vs atypical, poa:  -empiric abx -cx -pct pending  pulm edema:  -diurese as tolerated -hold right now with recent hypotension Acute decompensated HFpEF:  -recent echo done can obtain limited to ensure no drastic change -bnp 149 Afib:  -rate controlled at this time.  -on chronic a/c will cont for now.  -cont home amio Hypotension:  -post procedural -resolved with neo-stick  Hyperkalemia:  -recheck now -had been given lasix with  some diuresis CKD3:  -cr baseline ~2.3  Chronic macrocytic anemia:  -follow trend -no bleeding noted and no acute indication for transfusion -transfuse <7  t2dm with hyperglycemia:  -ssi   Best practice:  Diet: npo Pain/Anxiety/Delirium protocol (if indicated): per protocol VAP protocol (if indicated): per proctocol DVT prophylaxis: heparin GI prophylaxis: famotidine Glucose control: ssi Mobility: bed rest Code Status: FULL, previously was dnr so this will need to be addressed with family when able Family Communication: attempted to reach via phone without answer, will retry Disposition: ICU  Labs   CBC: Recent Labs  Lab 06/25/19 0845  WBC 9.7  NEUTROABS 6.4  HGB 11.8*  HCT 43.2  MCV 104.6*  PLT 562    Basic Metabolic Panel: Recent Labs  Lab 06/25/19 0845  NA 144  K 5.2*  CL 103  CO2 31  GLUCOSE 182*  BUN 42*  CREATININE 2.28*  CALCIUM 9.3   GFR: Estimated Creatinine Clearance: 43.6 mL/min (A) (by C-G formula based on SCr of 2.28 mg/dL (H)). Recent Labs  Lab 06/25/19 0845  WBC 9.7    Liver Function Tests: No results for input(s): AST, ALT, ALKPHOS, BILITOT, PROT, ALBUMIN in the last 168 hours. No results for input(s): LIPASE, AMYLASE in the last 168 hours. No results for input(s): AMMONIA in the last 168 hours.  ABG    Component Value Date/Time   PHART 7.109 (LL) 06/25/2019 0844   PCO2ART 117 (HH) 06/25/2019 0844   PO2ART 107 06/25/2019 0844   HCO3 35.8 (H) 06/25/2019 0844   TCO2 34 (H) 05/12/2019 1222   ACIDBASEDEF 1.0 02/16/2016 2031   O2SAT 97.1 06/25/2019 0844     Coagulation Profile: No results for input(s): INR, PROTIME in the last 168 hours.  Cardiac Enzymes: No results for input(s): CKTOTAL, CKMB, CKMBINDEX, TROPONINI in the last 168 hours.  HbA1C: Hgb A1c MFr Bld  Date/Time Value Ref Range Status  04/14/2019 04:37 AM 7.8 (H) 4.8 - 5.6 % Final    Comment:    (NOTE) Pre diabetes:          5.7%-6.4% Diabetes:               >6.4% Glycemic control for   <7.0% adults with diabetes   03/19/2019 10:32 AM 8.8 (H) 4.6 - 6.5 % Final    Comment:    Glycemic Control Guidelines for People with Diabetes:Non Diabetic:  <6%Goal of Therapy: <7%Additional Action Suggested:  >8%     CBG: No results for input(s): GLUCAP in the last 168 hours.  Review of Systems:   Unobtainable 2/2 pt intubated and sedated  Past Medical History  He,  has a past medical history of Brain tumor ([place] ), Bruises easily, CAD (coronary artery disease), Cardiomyopathy, ischemic, CHF (congestive heart failure) ([place]), Cyst near coccyx, Cyst near  tailbone, Diabetes mellitus without complication ([place]), Edema of both legs, Fibromyalgia, Gout, Headache(784.0), Hypertension, Kidney stones, Myocardial infarction ([place]), Obesity, Pneumonia, Shortness of breath, and Stroke ([place]).   Surgical History    Past Surgical History:  Procedure Laterality Date  . 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 ANAL FISTULECTOMY    . CARDIOVERSION N/A 05/08/2018   Procedure: CARDIOVERSION;  Surgeon: [name], MD;  Location[facility] ENDOSCOPY;  Service: Cardiovascular;  Laterality: N/A;  . CORONARY ARTERY BYPASS GRAFT     LIMA to LAD 2009  . CRANIOTOMY Right 11/20/2012   Procedure: CRANIOTOMY REPAIR DURAL/CENTRAL SPINAL FLUID LEAK;  Surgeon: [name], MD;  Location: [place] NEURO ORS;  Service: Neurosurgery;  Laterality: Right;  . EYE SURGERY     to coorect lid and double vision  . MEDIAN RECTUS REPAIR Right 02/16/2016   Procedure: RIGHT LATERAL RECTUS RESECTION; SUPERIOR RECTUS RESECTION RIGHT EYE; RIGHT SUPERIOR RECTUS RECESSION; LATERAL TARSAL STRIP RIGHT LOWER EYELID;  Surgeon: [name], MD;  Location: [place];  Service: Ophthalmology;  Laterality: Right;  . PLACEMENT OF LUMBAR DRAIN N/A 11/20/2012   Procedure: Attempted PLACEMENT OF LUMBAR  DRAIN;  Surgeon: [name], MD;  Location: [place] NEURO ORS;  Service: Neurosurgery;  Laterality: N/A;  . RETROSIGMOID CRANIECTOMY FOR TUMOR RESECTION Right 10/11/2012   Procedure: RETROSIGMOID CRANIECTOMY FOR TUMOR RESECTION;  Surgeon: [name], MD;  Location: [place] NEURO ORS;  Service: Neurosurgery;  Laterality: Right;  Craniotomy for acoustic neuroma  . TEE WITHOUT CARDIOVERSION N/A 05/08/2018   Procedure: TRANSESOPHAGEAL ECHOCARDIOGRAM (TEE);  Surgeon: [name], MD;  Location: [facility] ENDOSCOPY;  Service: Cardiovascular;  Laterality: N/A;  . TRACHEOSTOMY TUBE PLACEMENT N/A 10/11/2012   Procedure: TRACHEOSTOMY;  Surgeon: Ascencion Dike, MD;  Location: [facility] NEURO ORS;  Service: ENT;  Laterality: N/A;  . UMBILICAL HERNIA REPAIR       Social History   reports that he has quit smoking. His smoking use included cigarettes. He has a 5.00 pack-year smoking history. He uses smokeless tobacco. He reports that he does not drink alcohol or use drugs.   Family History   His family history includes CAD in his father; Cancer in an other family member; Diabetes in his maternal aunt and maternal grandmother; Hypertension in his mother; Leukemia in his cousin and maternal grandfather.   Allergies Allergies  Allergen Reactions  . Morphine And Related Anaphylaxis    "makes me stop breathing"  . Novocain [Procaine] Anaphylaxis  . Carbamazepine     shaking  . Codeine Other (See Comments)    "makes heart race" per patient  . Gabapentin Other (See Comments)    Blood in urine  . Ivp Dye [Iodinated Diagnostic Agents]     Passing out  . Other     Steroids makes hearts race  . Prednisone     Heart racing     Home Medications  Prior to Admission medications   Medication Sig Start Date End Date Taking? Authorizing Provider  albuterol (VENTOLIN HFA) 108 (90 Base) MCG/ACT inhaler Inhale 2 puffs into the lungs every 6 (six) hours as needed for wheezing or shortness of breath. 03/14/19   Koberlein, Steele Berg,  MD  amiodarone (PACERONE) 100 MG tablet Take 1 tablet (100 mg total) by mouth daily. 02/06/19   Clegg, Amy D, NP  apixaban (ELIQUIS) 5 MG TABS tablet  Take 1 tablet (5 mg total) by mouth 2 (two) times daily. 02/06/19   Clegg, Amy D, NP  artificial tears (LACRILUBE) OINT ophthalmic ointment Place 1 application into both eyes at bedtime as needed for dry eyes.     [provider]  Blood Glucose Monitoring Suppl (ONE TOUCH ULTRA MINI) w/Device KIT Use as directed twice a day 12/25/18   Caren Macadam, MD  carvedilol (COREG) 12.5 MG tablet Take 1 tablet (12.5 mg total) by mouth 2 (two) times daily with a meal. 02/06/19   Clegg, Amy D, NP  docusate sodium (COLACE) 100 MG capsule Take 100 mg by mouth at bedtime.    [provider]  Ensure Max Protein (ENSURE MAX PROTEIN) LIQD Take 330 mLs (11 oz total) by mouth daily. 06/06/19   Eugenie Filler, MD  Hydroactive Dressings (DUODERM HYDROACTIVE) GEL duoderm or generic equivalent gel dressing. 4cmx4cm. Apply to wound q 3 days until healed. 09/13/18   Caren Macadam, MD  HYDROcodone-acetaminophen (NORCO) 10-325 MG tablet Take 1 tablet by mouth every 6 (six) hours as needed (pain). 06/05/19   Eugenie Filler, MD  hydroxypropyl methylcellulose / hypromellose (ISOPTO TEARS / GONIOVISC) 2.5 % ophthalmic solution Place 1 drop into both eyes 3 (three) times daily as needed for dry eyes.    [provider]  insulin glargine (LANTUS) 100 UNIT/ML injection Inject 0.25 mLs (25 Units total) into the skin daily. 06/05/19   Eugenie Filler, MD  Insulin Pen Needle 31G X 8 MM MISC 1 Units by Does not apply route 2 (two) times a day. 02/11/19   Koberlein, Andris Flurry C, MD  ipratropium (ATROVENT) 0.02 % nebulizer solution Take 2.5 mLs (0.5 mg total) by nebulization 2 (two) times daily. 04/24/19   Samuella Cota, MD  isosorbide mononitrate (IMDUR) 30 MG 24 hr tablet Take 1 tablet (30 mg total) by mouth daily. Patient taking differently: Take 30  mg by mouth at bedtime.  02/06/19   Clegg, Amy D, NP  Lancets (ONETOUCH ULTRASOFT) lancets Use as instructed 12/25/18   Koberlein, Steele Berg, MD  levalbuterol (XOPENEX) 0.63 MG/3ML nebulizer solution Take 3 mLs (0.63 mg total) by nebulization 2 (two) times daily. Patient not taking: Reported on 05/12/2019 04/24/19 05/24/19  Samuella Cota, MD  Multiple Vitamin (MULTIVITAMIN WITH MINERALS) TABS tablet Take 1 tablet by mouth daily. Patient not taking: Reported on 05/12/2019 04/25/19   Samuella Cota, MD  nystatin (MYCOSTATIN/NYSTOP) powder APPLY  1  GRAM TOPICALLY TWICE DAILY AS NEEDED Patient would like Rx delivered Patient taking differently: Apply 1 g topically as needed. Under stomach and groin 04/30/19   Burchette, Alinda Sierras, MD  ONE TOUCH ULTRA TEST test strip Use as instructed twice a day 12/25/18   Koberlein, Steele Berg, MD  pantoprazole (PROTONIX) 40 MG tablet Take 1 tablet (40 mg total) by mouth at bedtime. 06/05/19   Eugenie Filler, MD  potassium chloride SA (K-DUR) 20 MEQ tablet Take 2 tablets (40 mEq total) by mouth 2 (two) times daily. 02/06/19   Clegg, Amy D, NP  rosuvastatin (CRESTOR) 20 MG tablet Take 1 tablet (20 mg total) by mouth daily at 6 PM. 03/25/19 05/12/19  Elodia Florence., MD  senna-docusate (SENOKOT-S) 8.6-50 MG tablet Take 1 tablet by mouth 2 (two) times daily. 06/05/19   Eugenie Filler, MD  tiZANidine (ZANAFLEX) 4 MG tablet TAKE 1 TABLET BY MOUTH FOUR TIMES DAILY AS NEEDED FOR MUSCLE SPASMS Patient taking differently: Take 4 mg  by mouth 4 (four) times daily as needed for muscle spasms.  03/17/19   Meredith Staggers, MD  topiramate (TOPAMAX) 100 MG tablet Take 1 tablet (100 mg total) by mouth at bedtime. 10/09/18   Meredith Staggers, MD  torsemide (DEMADEX) 20 MG tablet Take 4 tablets (80 mg total) by mouth 2 (two) times daily. 05/09/19   Darrick Grinder D, NP     Critical care time: The patient is critically ill with multiple organ systems failure and requires high complexity  decision making for assessment and support, frequent evaluation and titration of therapies, application of advanced monitoring technologies and extensive interpretation of multiple databases.  Critical care time 61 mins. This represents my time independent of the NP's/PA's/med students/residents time taking care of the pt. This is excluding procedures.     Audria Nine DO After hours pager: [phone]  [facility] 06/25/2019, 12:47 PM

## 2019-06-25 NOTE — Progress Notes (Addendum)
Pharmacy Antibiotic Note  [name]. is a 58 y.o. male admitted on 06/25/2019 with SOB x3 days requiring intubation in the ED.  Pharmacy has been consulted for vancomycin dosing for pneumonia. Chest x-ray found extensive bilateral pulmonary infiltrates. Current Scr of 2.28 is at approximately the patient's baseline (~2.3). WBC 9.7. Tmax 102. Lactic acid 1.3.   Vancomycin 1000 mg IV Q 24 hrs. Goal AUC 400-550. Expected AUC: 535 SCr used: 2.28 Vd used: 0.5   Plan: Vancomycin 2500mg  x1 loading dose  Start vancomycin 1000mg  IV q24h (next dose 1430 on 11/5) Monitor renal function, cultures/sensitivities, and clinical progression   Height: 5\' 11"  (180.3 cm) Weight: 231 lb 7.7 oz (105 kg) IBW/kg (Calculated) : 75.3  No data recorded.  Recent Labs  Lab 06/25/19 0845 06/25/19 1216  WBC 9.7  --   CREATININE 2.28*  --   LATICACIDVEN  --  1.3    Estimated Creatinine Clearance: 43.6 mL/min (A) (by C-G formula based on SCr of 2.28 mg/dL (H)).    Allergies  Allergen Reactions  . Morphine And Related Anaphylaxis    "makes me stop breathing"  . Novocain [Procaine] Anaphylaxis  . Carbamazepine     shaking  . Codeine Other (See Comments)    "makes heart race" per patient  . Gabapentin Other (See Comments)    Blood in urine  . Ivp Dye [Iodinated Diagnostic Agents]     Passing out  . Other     Steroids makes hearts race  . Prednisone     Heart racing    Antimicrobials this admission: Vancomycin 11/4 >> Cefepime 11/4 >>  Azithromycin 11/4 >> (11/8)  Dose adjustments this admission: N/A  Microbiology results: 11/4 BCx: collected 11/4 UCx: collected 11/4 Sputum: ordered   Thank you for allowing pharmacy to be a part of this patient's care.   Cristela Felt, PharmD PGY1 Pharmacy Resident [employer]: [phone]  06/25/2019 1:15 PM

## 2019-06-25 NOTE — Progress Notes (Signed)
Patient placed on bipap per MD order due to ABG results.  Patient tolerating well at this time.  Will obtain follow up ABG.  Will continue to monitor.

## 2019-06-25 NOTE — Patient Outreach (Signed)
  [facility]) Care Management Chronic Special Needs Program   06/25/2019  Name: Frank Polyak., DOB: Nov 28, 1960  MRN: [id number]  Interdisciplinary care team meeting.  The following issues were discussed:  Client's needs, Changes in health status, Care Plan, Coordination of care, Care transitions and Issues/barriers to care   Client engaged with utilization management post transition from [facility]. Per notification client left [facility]. Client had agreed to [facility] services for complex community care management.  Participants present:  Utilization management team, [name], RNCM, [name], RNCM  Plan: [facility] to engage for complex community care; [facility] will care coordinate and communicate with hospital liaison regarding discharge needs and to assist with discharge planning as needed. RNCM will continue to follow.  Frank Silversmith, RN, MSN, [place] [phone]

## 2019-06-25 NOTE — ED Triage Notes (Signed)
Per [facility] pt called for increasing SOB x3 days. Pt recently discharged from [facility] for rehab where they managed his COPD and CHF with cpap. Pt reports edema for x3 days. Pt wears 4 L/min O2 at home. Pt 98% on 10L and 100% on 15L. A/o x4

## 2019-06-25 NOTE — Plan of Care (Signed)

## 2019-06-25 NOTE — Progress Notes (Signed)
  Echocardiogram 2D Echocardiogram was attempted but the patient was off the floor.   Jennette Dubin 06/25/2019, 2:56 PM

## 2019-06-25 NOTE — ED Provider Notes (Signed)
  INTUBATION Performed by: Domenic Moras under direct supervision of Dr. Wilson Singer  Required items: required blood products, implants, devices, and special equipment available Patient identity confirmed: provided demographic data and hospital-assigned identification number Time out: Immediately prior to procedure a "time out" was called to verify the correct patient, procedure, equipment, support staff and site/side marked as required.  Indications: hypoxia  Intubation method: Glidescope Laryngoscopy   Preoxygenation: BVM  Sedatives: Etomidate Paralytic: Succinylcholine  Tube Size: .7.5 cuffed  Post-procedure assessment: chest rise and ETCO2 monitor Breath sounds: equal and absent over the epigastrium Tube secured with: ETT holder Chest x-ray interpreted by radiologist and me.  Chest x-ray findings: endotracheal tube in appropriate position  Patient tolerated the procedure well with no immediate complications.      Domenic Moras, PA-C 06/25/19 1129    Virgel Manifold, MD 06/26/19 [phone]

## 2019-06-25 NOTE — ED Provider Notes (Signed)
[facility] Provider Note   CSN: [id number] Arrival date & time: 06/25/19  8315     History   Chief Complaint Chief Complaint  Patient presents with  . Shortness of Breath    HPI [name]. is a 58 y.o. male.     HPI   58 year old male with dyspnea.  Worsened over the past 3 days.  Worsening swelling.  Patient is currently very short of breath and very drowsy as well.  History is limited because of this.  He does answer some yes/no questions though.  Denies any acute pain.  He is not sure if he has had a fever or not.  He is normally on 4 L/min of oxygen via nasal cannula at baseline. Past Medical History:  Diagnosis Date  . Brain tumor ([place])    Takes Topamax so patient will not have headaches  . Bruises easily   . CAD (coronary artery disease)    Cath 09, 99% LAD  . Cardiomyopathy, ischemic    Ischemic  . CHF (congestive heart failure) ([place])   . Cyst near coccyx   . Cyst near tailbone   . Diabetes mellitus without complication (HCC)    borderline  . Edema of both legs    Takes Lasix  . Fibromyalgia   . Gout   . Headache(784.0)    with lights  . Hypertension    dr Percival Spanish  . Kidney stones   . Myocardial infarction ([place])   . Obesity   . Pneumonia    hx of  . Shortness of breath   . Stroke [facility])    Affected vision, walking, and facial drooping on right face    Patient Active Problem List   Diagnosis Date Noted  . Chronic pain syndrome   . Hypokalemia   . History of chronic atrial fibrillation   . Acute on chronic diastolic CHF (congestive heart failure) ([place])   . Dysphagia   . Goals of care, counseling/discussion   . Palliative care encounter   . CKD (chronic kidney disease), stage IV ([place]) 04/23/2019  . Obstructive sleep apnea   . Respiratory failure ([place]) 03/22/2019  . Morbid obesity with BMI of 50.0-59.9, adult ([place])   . Acute on chronic diastolic heart failure ([place]) 02/23/2019  . Acute renal  failure with acute renal cortical necrosis superimposed on stage 4 chronic kidney disease ([place])   . Acute respiratory failure with hypoxia and hypercapnia ([place]) 02/22/2019  . SOB (shortness of breath) 02/22/2019  . Chronic obstructive pulmonary disease ([place]) 02/22/2019  . CKD stage 3 secondary to diabetes ([place]) 09/14/2018  . Type 2 diabetes mellitus with other specified complication ([place]) 17/61/6073  . Chronic diastolic heart failure ([place]) 05/20/2018  . Acute diastolic heart failure ([place])   . Atrial fibrillation with RVR ([place])   . Carbon dioxide retention   . AKI (acute kidney injury) ([place])   . Pressure injury of skin 01/29/2017  . Hereditary and idiopathic peripheral neuropathy 01/08/2017  . Acute on chronic respiratory failure with hypoxia and hypercapnia ([place]) 02/16/2016  . Conjunctivitis 10/18/2015  . 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 Headache due to intracranial disease 10/13/2013  . Edema 12/17/2012  . 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 Acoustic neuroma ([place]) 10/12/2012  . Status post craniotomy 10/12/2012  . CAD (coronary artery disease) of artery bypass graft 09/09/2012  . 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 Obesity, Class III, BMI 40-49.9 (morbid obesity) ([place]) 09/09/2012  Past Surgical History:  Procedure Laterality Date  . 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 ANAL FISTULECTOMY    . CARDIOVERSION N/A 05/08/2018   Procedure: CARDIOVERSION;  Surgeon: [name], MD;  Location: [facility] ENDOSCOPY;  Service: Cardiovascular;  Laterality: N/A;  . CORONARY ARTERY BYPASS GRAFT     LIMA to LAD 2009  . CRANIOTOMY Right 11/20/2012   Procedure: CRANIOTOMY REPAIR DURAL/CENTRAL SPINAL FLUID LEAK;  Surgeon: [name], MD;  Location: [facility] NEURO ORS;  Service: Neurosurgery;  Laterality: Right;  . EYE SURGERY     to coorect lid  and double vision  . MEDIAN RECTUS REPAIR Right 02/16/2016   Procedure: RIGHT LATERAL RECTUS RESECTION; SUPERIOR RECTUS RESECTION RIGHT EYE; RIGHT SUPERIOR RECTUS RECESSION; LATERAL TARSAL STRIP RIGHT LOWER EYELID;  Surgeon: [name], MD;  Location: [place];  Service: Ophthalmology;  Laterality: Right;  . PLACEMENT OF LUMBAR DRAIN N/A 11/20/2012   Procedure: Attempted PLACEMENT OF LUMBAR DRAIN;  Surgeon: [name], MD;  Location: [facility] NEURO ORS;  Service: Neurosurgery;  Laterality: N/A;  . RETROSIGMOID CRANIECTOMY FOR TUMOR RESECTION Right 10/11/2012   Procedure: RETROSIGMOID CRANIECTOMY FOR TUMOR RESECTION;  Surgeon: [name], MD;  Location: [facility] NEURO ORS;  Service: Neurosurgery;  Laterality: Right;  Craniotomy for acoustic neuroma  . TEE WITHOUT CARDIOVERSION N/A 05/08/2018   Procedure: TRANSESOPHAGEAL ECHOCARDIOGRAM (TEE);  Surgeon: [name], MD;  Location: [facility] ENDOSCOPY;  Service: Cardiovascular;  Laterality: N/A;  . TRACHEOSTOMY TUBE PLACEMENT N/A 10/11/2012   Procedure: TRACHEOSTOMY;  Surgeon: Ascencion Dike, MD;  Location: [facility] NEUR[facility]S;  Service: ENT;  Laterality: N/A;  . UMBILICAL HERNIA REPAIR          Home Medications    Prior to Admission medications   Medication Sig Start Date End Date Taking? Authorizing Provider  albuterol (VENTOLIN HFA) 108 (90 Base) MCG/ACT inhaler Inhale 2 puffs into the lungs every 6 (six) hours as needed for wheezing or shortness of breath. 03/14/19   Koberlein, Steele Berg, MD  amiodarone (PACERONE) 100 MG tablet Take 1 tablet (100 mg total) by mouth daily. 02/06/19   Clegg, Amy D, NP  apixaban (ELIQUIS) 5 MG TABS tablet Take 1 tablet (5 mg total) by mouth 2 (two) times daily. 02/06/19   Clegg, Amy D, NP  artificial tears (LACRILUBE) OINT ophthalmic ointment Place 1 application into both eyes at bedtime as needed for dry eyes.     [provider]  Blood Glucose Monitoring Suppl (ONE TOUCH ULTRA MINI) w/Device KIT Use as directed twice a day  12/25/18   Caren Macadam, MD  carvedilol (COREG) 12.5 MG tablet Take 1 tablet (12.5 mg total) by mouth 2 (two) times daily with a meal. 02/06/19   Clegg, Amy D, NP  docusate sodium (COLACE) 100 MG capsule Take 100 mg by mouth at bedtime.    [provider]  Ensure Max Protein (ENSURE MAX PROTEIN) LIQD Take 330 mLs (11 oz total) by mouth daily. 06/06/19   Eugenie Filler, MD  Hydroactive Dressings (DUODERM HYDROACTIVE) GEL duoderm or generic equivalent gel dressing. 4cmx4cm. Apply to wound q 3 days until healed. 09/13/18   Caren Macadam, MD  HYDROcodone-acetaminophen (NORCO) 10-325 MG tablet Take 1 tablet by mouth every 6 (six) hours as needed (pain). 06/05/19   Eugenie Filler, MD  hydroxypropyl methylcellulose / hypromellose (ISOPTO  TEARS / GONIOVISC) 2.5 % ophthalmic solution Place 1 drop into both eyes 3 (three) times daily as needed for dry eyes.    [provider]  insulin glargine (LANTUS) 100 UNIT/ML injection Inject 0.25 mLs (25 Units total) into the skin daily. 06/05/19   Eugenie Filler, MD  Insulin Pen Needle 31G X 8 MM MISC 1 Units by Does not apply route 2 (two) times a day. 02/11/19   Koberlein, Andris Flurry C, MD  ipratropium (ATROVENT) 0.02 % nebulizer solution Take 2.5 mLs (0.5 mg total) by nebulization 2 (two) times daily. 04/24/19   Samuella Cota, MD  isosorbide mononitrate (IMDUR) 30 MG 24 hr tablet Take 1 tablet (30 mg total) by mouth daily. Patient taking differently: Take 30 mg by mouth at bedtime.  02/06/19   Clegg, Amy D, NP  Lancets (ONETOUCH ULTRASOFT) lancets Use as instructed 12/25/18   Koberlein, Steele Berg, MD  levalbuterol (XOPENEX) 0.63 MG/3ML nebulizer solution Take 3 mLs (0.63 mg total) by nebulization 2 (two) times daily. Patient not taking: Reported on 05/12/2019 04/24/19 05/24/19  Samuella Cota, MD  Multiple Vitamin (MULTIVITAMIN WITH MINERALS) TABS tablet Take 1 tablet by mouth daily. Patient not taking: Reported on 05/12/2019 04/25/19    Samuella Cota, MD  nystatin (MYCOSTATIN/NYSTOP) powder APPLY  1  GRAM TOPICALLY TWICE DAILY AS NEEDED Patient would like Rx delivered Patient taking differently: Apply 1 g topically as needed. Under stomach and groin 04/30/19   Burchette, Alinda Sierras, MD  ONE TOUCH ULTRA TEST test strip Use as instructed twice a day 12/25/18   Koberlein, Steele Berg, MD  pantoprazole (PROTONIX) 40 MG tablet Take 1 tablet (40 mg total) by mouth at bedtime. 06/05/19   Eugenie Filler, MD  potassium chloride SA (K-DUR) 20 MEQ tablet Take 2 tablets (40 mEq total) by mouth 2 (two) times daily. 02/06/19   Clegg, Amy D, NP  rosuvastatin (CRESTOR) 20 MG tablet Take 1 tablet (20 mg total) by mouth daily at 6 PM. 03/25/19 05/12/19  Elodia Florence., MD  senna-docusate (SENOKOT-S) 8.6-50 MG tablet Take 1 tablet by mouth 2 (two) times daily. 06/05/19   Eugenie Filler, MD  tiZANidine (ZANAFLEX) 4 MG tablet TAKE 1 TABLET BY MOUTH FOUR TIMES DAILY AS NEEDED FOR MUSCLE SPASMS Patient taking differently: Take 4 mg by mouth 4 (four) times daily as needed for muscle spasms.  03/17/19   Meredith Staggers, MD  topiramate (TOPAMAX) 100 MG tablet Take 1 tablet (100 mg total) by mouth at bedtime. 10/09/18   Meredith Staggers, MD  torsemide (DEMADEX) 20 MG tablet Take 4 tablets (80 mg total) by mouth 2 (two) times daily. 05/09/19   Conrad Alder, NP    Family History Family History  Problem Relation Age of Onset  . CAD Father   . Hypertension Mother   . Cancer Other        multiple relatives  . Diabetes Maternal Grandmother   . Diabetes Maternal Aunt   . Leukemia Cousin   . Leukemia Maternal Grandfather     Social History Social History   Tobacco Use  . Smoking status: Former Smoker    Packs/day: 0.25    Years: 20.00    Pack years: 5.00    Types: Cigarettes  . Smokeless tobacco: Current User  . Tobacco comment: Rare tobacco.  "[name]".  Substance Use Topics  . Alcohol use: No    Alcohol/week: 0.0 standard  drinks  .  Drug use: No     Allergies   Morphine and related, Novocain [procaine], Carbamazepine, Codeine, Gabapentin, Ivp dye [iodinated diagnostic agents], Other, and Prednisone   Review of Systems Review of Systems  Level 5 caveat given acuity of illness. Physical Exam Updated Vital Signs BP 130/77   Pulse (!) 55   Resp (!) 28   Ht 5' 11"  (1.803 m)   Wt 105 kg   SpO2 94%   BMI 32.29 kg/m   Physical Exam Vitals signs and nursing note reviewed.  Constitutional:      Appearance: He is obese. He is ill-appearing.     Comments: Very drowsy.  Will open eyes to voice.  Will nod his head yes/no.  Will follow simple commands.  HENT:     Head: Normocephalic and atraumatic.  Eyes:     General:        Right eye: No discharge.        Left eye: No discharge.     Conjunctiva/sclera: Conjunctivae normal.     Pupils: Pupils are equal, round, and reactive to light.  Neck:     Musculoskeletal: Neck supple.  Cardiovascular:     Rate and Rhythm: Normal rate. Rhythm irregular.     Heart sounds: Normal heart sounds. No murmur. No friction rub. No gallop.   Pulmonary:     Effort: Respiratory distress present.     Breath sounds: Wheezing present.  Abdominal:     General: There is no distension.     Palpations: Abdomen is soft.     Tenderness: There is no abdominal tenderness.  Musculoskeletal:        General: No tenderness.     Right lower leg: Edema present.     Left lower leg: Edema present.     Comments: Severe, symmetric pitting lower extremity edema  Skin:    General: Skin is warm and dry.      ED Treatments / Results  Labs (all labs ordered are listed, but only abnormal results are displayed) Labs Reviewed  CBC WITH DIFFERENTIAL/PLATELET - Abnormal; Notable for the following components:      Result Value   RBC 4.13 (*)    Hemoglobin 11.8 (*)    MCV 104.6 (*)    MCHC 27.3 (*)    Abs Immature Granulocytes 0.08 (*)    All other components within normal limits   BASIC METABOLIC PANEL - Abnormal; Notable for the following components:   Potassium 5.2 (*)    Glucose, Bld 182 (*)    BUN 42 (*)    Creatinine, Ser 2.28 (*)    GFR calc non Af Amer 30 (*)    GFR calc Af Amer 35 (*)    All other components within normal limits  BRAIN NATRIURETIC PEPTIDE - Abnormal; Notable for the following components:   B Natriuretic Peptide 149.8 (*)    All other components within normal limits  BLOOD GAS, ARTERIAL - Abnormal; Notable for the following components:   pH, Arterial 7.109 (*)    pCO2 arterial 117 (*)    Bicarbonate 35.8 (*)    Acid-Base Excess 6.4 (*)    All other components within normal limits  SARS CORONAVIRUS 2 BY RT PCR (HOSPITAL ORDER, [place] [facility])  URINALYSIS, ROUTINE W REFLEX MICROSCOPIC  I-STAT ARTERIAL BLOOD GAS, ED  I-STAT ARTERIAL BLOOD GAS, ED  TROPONIN I (HIGH SENSITIVITY)  TROPONIN I (HIGH SENSITIVITY)    EKG EKG Interpretation  Date/Time:  Wednesday June 25 2019 08:17:35 EST Ventricular Rate:  82 PR Interval:    QRS Duration: 107 QT Interval:  436 QTC Calculation: 510 R Axis:   121 Text Interpretation: Atrial fibrillation Ventricular premature complex Left posterior fascicular block Confirmed by Virgel Manifold [phone]) on 06/25/2019 9:04:31 AM   Radiology Dg Chest Portable 1 View  Result Date: 06/25/2019 CLINICAL DATA:  Shortness of breath. Wheezing. EXAM: PORTABLE CHEST 1 VIEW COMPARISON:  Chest x-ray dated 05/13/2019, 03/23/2019, 04/23/2019 FINDINGS: Patient has extensive bilateral pulmonary infiltrates, with diffuse progression on the right and progression at the left base since the prior study. Chronic cardiomegaly. Pulmonary vascularity is normal. No definable effusions. No acute bone abnormality. IMPRESSION: 1. Extensive bilateral pulmonary infiltrates, with diffuse progression on the right and progression at the left base since the prior study. 2. Chronic cardiomegaly. Electronically Signed    By: Lorriane Shire M.D.   On: 06/25/2019 09:26    Procedures Procedures (including critical care time)  CRITICAL CARE Performed by: Virgel Manifold Total critical care time: 40 minutes Critical care time was exclusive of separately billable procedures and treating other patients. Critical care was necessary to treat or prevent imminent or life-threatening deterioration. Critical care was time spent personally by me on the following activities: development of treatment plan with patient and/or surrogate as well as nursing, discussions with consultants, evaluation of patient's response to treatment, examination of patient, obtaining history from patient or surrogate, ordering and performing treatments and interventions, ordering and review of laboratory studies, ordering and review of radiographic studies, pulse oximetry and re-evaluation of patient's condition.  INTUBATION Performed by: Virgel Manifold  Required items: required blood products, implants, devices, and special equipment available Patient identity confirmed: provided demographic data and hospital-assigned identification number Time out: Immediately prior to procedure a "time out" was called to verify the correct patient, procedure, equipment, support staff and site/side marked as required.  Indications: respiratory failure Intubation method: Glidescope Laryngoscopy   Preoxygenation: bipap  Sedatives:Etomidate Paralytic: Rocuronium  Tube Size: 7.5 cuffed  Post-procedure assessment: chest rise and ETCO2 monitor Breath sounds: equal and absent over the epigastrium Tube secured with: ETT holder Chest x-ray interpreted by radiologist and me.  Chest x-ray findings: endotracheal tube in appropriate position  Patient tolerated the procedure well with no immediate complications.      Medications Ordered in ED Medications  albuterol (VENTOLIN HFA) 108 (90 Base) MCG/ACT inhaler 8 puff (has no administration in time range)   ipratropium (ATROVENT HFA) inhaler 2 puff (has no administration in time range)  AeroChamber Plus Flo-Vu Large MISC 1 each (has no administration in time range)  ceFEPIme (MAXIPIME) 2 g in sodium chloride 0.9 % 100 mL IVPB (has no administration in time range)  azithromycin (ZITHROMAX) 500 mg in sodium chloride 0.9 % 250 mL IVPB (500 mg Intravenous New Bag/Given 06/25/19 1010)  furosemide (LASIX) injection 80 mg (80 mg Intravenous Given 06/25/19 0949)  methylPREDNISolone sodium succinate (SOLU-MEDROL) 125 mg/2 mL injection 125 mg (125 mg Intravenous Given 06/25/19 0952)     Initial Impression / Assessment and Plan / ED Course  I have reviewed the triage vital signs and the nursing notes.  Pertinent labs & imaging results that were available during my care of the patient were reviewed by me and considered in my medical decision making (see chart for details).     58 year old male with acute on chronic respiratory failure.  Normally on 4 L of oxygen per minute via nasal cannula.  Requiring about 10 L to keep his  O2 sats around 90.  Very drowsy.  Initially ABG as above.  Placed on BiPAP.  Wheezing with history of COPD.  Albuterol/Atrovent and steroids.  Appears to be markedly volume overloaded.  BNP only mildly elevated, but he is obese.  Chest x-ray with diffuse bilateral infiltrates.  I feel infection is less likely but, given his degree of illness, empiric antibiotics were ordered.   Tried BiPAP. Actually became more somnolent. Had to intubate. On review of records, this presentation is a continuing trend over the past several months. He was intubated on admission last month and no MOST, SD or DNR documented on chart. I did subsequently see mention of DNR when discharged to rehab most recently.   Final Clinical Impressions(s) / ED Diagnoses   Final diagnoses:  Acute on chronic respiratory failure with hypoxia and hypercapnia ([place])  Acute pulmonary edema [facility])    ED Discharge Orders    None        Virgel Manifold, MD 06/28/19 1234

## 2019-06-25 NOTE — Progress Notes (Signed)
[facility] Progress Note Patient Name: [name]. DOB: [birth year] MRN: [id number]   Date of Service  06/25/2019  HPI/Events of Note  Notified of 2 orders for Protonix. Patient is on ventilator with gastric tube.   eICU Interventions  Will order: 1. D/C Protonix EC capsule PO.     Intervention Category Major Interventions: Other:  Lysle Dingwall 06/25/2019, 8:05 PM

## 2019-06-25 NOTE — Progress Notes (Signed)
  Echocardiogram 2D Echocardiogram limited has been performed.  Jennette Dubin 06/25/2019, 3:43 PM

## 2019-06-26 DIAGNOSIS — I959 Hypotension, unspecified: Secondary | ICD-10-CM

## 2019-06-26 DIAGNOSIS — G9340 Encephalopathy, unspecified: Secondary | ICD-10-CM

## 2019-06-26 DIAGNOSIS — J9621 Acute and chronic respiratory failure with hypoxia: Principal | ICD-10-CM

## 2019-06-26 DIAGNOSIS — J810 Acute pulmonary edema: Secondary | ICD-10-CM

## 2019-06-26 DIAGNOSIS — J9622 Acute and chronic respiratory failure with hypercapnia: Secondary | ICD-10-CM

## 2019-06-26 LAB — CBC
HCT: 38.3 % — ABNORMAL LOW (ref 39.0–52.0)
Hemoglobin: 11 g/dL — ABNORMAL LOW (ref 13.0–17.0)
MCH: 28.5 pg (ref 26.0–34.0)
MCHC: 28.7 g/dL — ABNORMAL LOW (ref 30.0–36.0)
MCV: 99.2 fL (ref 80.0–100.0)
Platelets: 215 10*3/uL (ref 150–400)
RBC: 3.86 MIL/uL — ABNORMAL LOW (ref 4.22–5.81)
RDW: 14.8 % (ref 11.5–15.5)
WBC: 10.9 10*3/uL — ABNORMAL HIGH (ref 4.0–10.5)
nRBC: 0 % (ref 0.0–0.2)

## 2019-06-26 LAB — LEGIONELLA PNEUMOPHILA SEROGP 1 UR AG: L. pneumophila Serogp 1 Ur Ag: NEGATIVE

## 2019-06-26 LAB — BASIC METABOLIC PANEL
Anion gap: 11 (ref 5–15)
BUN: 50 mg/dL — ABNORMAL HIGH (ref 6–20)
CO2: 28 mmol/L (ref 22–32)
Calcium: 9.6 mg/dL (ref 8.9–10.3)
Chloride: 105 mmol/L (ref 98–111)
Creatinine, Ser: 2.65 mg/dL — ABNORMAL HIGH (ref 0.61–1.24)
GFR calc Af Amer: 29 mL/min — ABNORMAL LOW (ref >60)
GFR calc non Af Amer: 25 mL/min — ABNORMAL LOW (ref >60)
Glucose, Bld: 195 mg/dL — ABNORMAL HIGH (ref 70–99)
Potassium: 4.3 mmol/L (ref 3.5–5.1)
Sodium: 144 mmol/L (ref 135–145)

## 2019-06-26 LAB — GLUCOSE, CAPILLARY
Glucose-Capillary: 163 mg/dL — ABNORMAL HIGH (ref 70–99)
Glucose-Capillary: 175 mg/dL — ABNORMAL HIGH (ref 70–99)
Glucose-Capillary: 189 mg/dL — ABNORMAL HIGH (ref 70–99)
Glucose-Capillary: 206 mg/dL — ABNORMAL HIGH (ref 70–99)
Glucose-Capillary: 226 mg/dL — ABNORMAL HIGH (ref 70–99)
Glucose-Capillary: 276 mg/dL — ABNORMAL HIGH (ref 70–99)

## 2019-06-26 LAB — MAGNESIUM: Magnesium: 2.4 mg/dL (ref 1.7–2.4)

## 2019-06-26 LAB — LACTIC ACID, PLASMA: Lactic Acid, Venous: 1.8 mmol/L (ref 0.5–1.9)

## 2019-06-26 MED ORDER — PRO-STAT SUGAR FREE PO LIQD
30.0000 mL | Freq: Every day | ORAL | Status: DC
  Administered 2019-06-26 – 2019-06-30 (×12): 30 mL
  Filled 2019-06-26 (×14): qty 30

## 2019-06-26 MED ORDER — FUROSEMIDE 10 MG/ML IJ SOLN
40.0000 mg | Freq: Four times a day (QID) | INTRAMUSCULAR | Status: AC
  Administered 2019-06-26 (×3): 40 mg via INTRAVENOUS
  Filled 2019-06-26 (×3): qty 4

## 2019-06-26 MED ORDER — INSULIN ASPART 100 UNIT/ML ~~LOC~~ SOLN: 0.0000 [IU] | SUBCUTANEOUS | Status: DC | PRN

## 2019-06-26 MED ORDER — INSULIN ASPART 100 UNIT/ML ~~LOC~~ SOLN: 0.0000 [IU] | Freq: Three times a day (TID) | SUBCUTANEOUS | Status: DC

## 2019-06-26 MED ORDER — METOLAZONE 5 MG PO TABS
10.0000 mg | ORAL_TABLET | Freq: Once | ORAL | Status: AC
  Administered 2019-06-26: 10 mg via ORAL
  Filled 2019-06-26: qty 1
  Filled 2019-06-26: qty 2

## 2019-06-26 MED ORDER — INSULIN ASPART 100 UNIT/ML ~~LOC~~ SOLN
0.0000 [IU] | SUBCUTANEOUS | Status: DC
  Administered 2019-06-26: 5 [IU] via SUBCUTANEOUS
  Administered 2019-06-26 – 2019-06-27 (×2): 8 [IU] via SUBCUTANEOUS
  Administered 2019-06-27: 12:00:00 5 [IU] via SUBCUTANEOUS
  Administered 2019-06-27: 05:00:00 8 [IU] via SUBCUTANEOUS
  Administered 2019-06-27: 5 [IU] via SUBCUTANEOUS
  Administered 2019-06-27: 16:00:00 3 [IU] via SUBCUTANEOUS
  Administered 2019-06-27: 20:00:00 5 [IU] via SUBCUTANEOUS
  Administered 2019-06-28 (×3): 3 [IU] via SUBCUTANEOUS
  Administered 2019-06-28: 5 [IU] via SUBCUTANEOUS
  Administered 2019-06-28: 17:00:00 3 [IU] via SUBCUTANEOUS
  Administered 2019-06-29: 13:00:00 8 [IU] via SUBCUTANEOUS
  Administered 2019-06-29: 2 [IU] via SUBCUTANEOUS
  Administered 2019-06-29: 09:00:00 5 [IU] via SUBCUTANEOUS

## 2019-06-26 MED ORDER — ACETAMINOPHEN 160 MG/5ML PO SOLN
650.0000 mg | Freq: Four times a day (QID) | ORAL | Status: DC | PRN
  Administered 2019-06-26 (×2): 650 mg
  Filled 2019-06-26 (×2): qty 20.3

## 2019-06-26 MED ORDER — VITAL AF 1.2 CAL PO LIQD
1000.0000 mL | ORAL | Status: DC
  Administered 2019-06-26: 1000 mL
  Filled 2019-06-26 (×3): qty 1000

## 2019-06-26 NOTE — TOC Initial Note (Signed)
Transition of Care (TOC) - Initial/Assessment Note    Patient Details  Name: [name]. MRN: [id number] Date of Birth: September 02, 1960  Transition of [facility]) CM/SW Contact:    Maryclare Labrador, RN Phone Number: 06/26/2019, 12:46 PM  Clinical Narrative:      PTA from home with wife, pt recently discharged home from [facility] on 10/29.  Pt is currently ventilated.  CM confirmed with pts wife that he has the working triology in the home - per wife pt is compliant with machine.  Pt was discharged home with [facility] however wife unsure about name - wife to call CM back once she locates agency name.   Palliative had [place] discussions last admit - pt remains full code.              Expected Discharge Plan: [place] Barriers to Discharge: Continued Medical Work up   Patient Goals and CMS Choice        Expected Discharge Plan and Services Expected Discharge Plan: [place]       Living arrangements for the past 2 months: Single Family Home                                      Prior Living Arrangements/Services Living arrangements for the past 2 months: Single Family Home Lives with:: Spouse Patient language and need for interpreter reviewed:: Yes        Need for Family Participation in Patient Care: Yes (Comment) Care giver support system in place?: Yes (comment) Current home services: DME([facility]) Criminal Activity/Legal Involvement Pertinent to Current Situation/Hospitalization: No - Comment as needed  Activities of Daily Living      Permission Sought/Granted                  Emotional Assessment              Admission diagnosis:  Acute pulmonary edema ([place]) [J81.0] Acute on chronic respiratory failure with hypoxia and hypercapnia (HCC) [C12.75, J96.22] Patient Active Problem List   Diagnosis Date Noted  . Acute pulmonary edema (HCC)   . Chronic pain syndrome   . Hypokalemia   . History of chronic  atrial fibrillation   . Acute on chronic diastolic CHF (congestive heart failure) ([place])   . Dysphagia   . Goals of care, counseling/discussion   . Palliative care encounter   . CKD (chronic kidney disease), stage IV ([place]) 04/23/2019  . Obstructive sleep apnea   . Respiratory failure ([place]) 03/22/2019  . Morbid obesity with BMI of 50.0-59.9, adult ([place])   . Acute on chronic diastolic heart failure ([place]) 02/23/2019  . Acute renal failure with acute renal cortical necrosis superimposed on stage 4 chronic kidney disease ([place])   . Acute respiratory failure with hypoxia and hypercapnia ([place]) 02/22/2019  . SOB (shortness of breath) 02/22/2019  . Chronic obstructive pulmonary disease ([place]) 02/22/2019  . CKD stage 3 secondary to diabetes ([place]) 09/14/2018  . Type 2 diabetes mellitus with other specified complication ([place]) 17/00/1749  . Chronic diastolic heart failure ([place]) 05/20/2018  . Acute diastolic heart failure ([place])   . Atrial fibrillation with RVR ([place])   . Carbon dioxide retention   . AKI (acute kidney injury) ([place])   . Pressure injury of skin 01/29/2017  . Hereditary and idiopathic peripheral neuropathy 01/08/2017  . Acute on chronic respiratory failure with  hypoxia and hypercapnia ([place]) 02/16/2016  . Conjunctivitis 10/18/2015  . 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 Headache due to intracranial disease 10/13/2013  . Edema 12/17/2012  . 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 Acoustic neuroma ([place]) 10/12/2012  . Status post craniotomy 10/12/2012  . CAD (coronary artery disease) of artery bypass graft 09/09/2012  . 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 Obesity, Class III, BMI 40-49.9 (morbid obesity) ([place]) 09/09/2012   PCP:  Caren Macadam, MD Pharmacy:   [id number] [place], [place] - [address] [place] [address] Phone: [phone] Fax: [phone]     Social Determinants of Health  (SDOH) Interventions    Readmission Risk Interventions Readmission Risk Prevention Plan 06/26/2019 04/24/2019 04/18/2019  Transportation Screening Complete Complete Complete  PCP or Specialist Appt within 3-5 Days - - -  Not Complete comments - - -  [place] or [place] Work Consult for [place] Planning/Counseling - - -  Palliative Care Screening - - -  Medication Review Press photographer) - Complete Complete  PCP or Specialist appointment within 3-5 days of discharge - (No Data) Patient refused  [place] or [place] - Complete -  Palliative Care Screening (No Data) Complete -  [place] (No Data) Not Applicable -  Some recent data might be hidden

## 2019-06-26 NOTE — H&P (Addendum)
**Note Frank-Identified via Obfuscation** NAME:  Frank Jaworski., MRN:  [id number], DOB:  Dec 17, 1960, LOS: 1 ADMISSION DATE:  06/25/2019, CONSULTATION DATE:  06/25/19 REFERRING MD:  Dr Wilson Singer, CHIEF COMPLAINT:  sob  Brief History   58 yo with h/o COPD/CHF presented with SOB x3 days req. 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 CCM called to admit pt.   Pt was seen intubated and sedated. Per ED physician but was sob and altered, NIV was intitiated but pt cont to worsen and was more lethargic. He was intubated. After intubation pt became hypotensive req neo stick infusion. He was diffusely wheezing given bolus steroid in ed as neb started.   Past Medical History  Brain tumor CAD with prior MI HFpEF Type 2 diabetes  Fibromyalgia Gout Hypertension Obesity COPD  OSA noncompliant CPAP  Significant Hospital Events   11/4 Admitted and intubated  Consults:  PCCM  Procedures:  ETT 11/4 >>   Significant Diagnostic Tests:  CXR 11/4 >> Extensive bilateral infiltrates, with diffuse progression on right and progression at base since prior study, chronic cardiomegaly    Micro Data:  COVID 11/4 >> neg Urine culture 11/4 >>  Blood culture 11/4 >> RVP >> neg Sputum culture 11/4 >>   Antimicrobials:  Vanc 11/4 >> Cefepime 11/4 >>  Interim history/subjective:  11/4: pt was intubated and sedated today after presentation.  11/5: RN reports no acute events overnight, he is lying in bed on vent in in no acute distress. Diuresed with with IV lasix yesterday 1.7L removed.   Objective   Blood pressure 136/69, pulse (!) 108, temperature 100 F (37.8 C), resp. rate (!) 24, height 5\' 11"  (1.803 m), weight 105 kg, SpO2 100 %.    Vent Mode: PRVC FiO2 (%):   [50 %-100 %] 50 % Set Rate:  [15 bmp-24 bmp] 24 bmp Vt Set:  [560 mL-600 mL] 600 mL PEEP:  [5 cmH20-8 cmH20] 8 cmH20 Plateau Pressure:  [28 cmH20-35 cmH20] 33 cmH20   Intake/Output Summary (Last 24 hours) at 06/26/2019 0737 Last data filed at 06/26/2019 0600 Gross per 24 hour  Intake 2320.81 ml  Output 1900 ml  Net 420.81 ml   Filed Weights   06/25/19 0819  Weight: 105 kg    Examination: General: Chronically ill appearing elderly obese male on mechanical ventilation, in NAD HEENT: ETT, MM pink/moist, PERRL,  Neuro: Alert and able to follow simple command and nod to simple questions CV: s1s2 irregularly irregular rate and rhythm, no murmur, rubs, or gallops,  PULM:  Clear to ascultation, with no added breath sounds, deminised in bases  GI: soft, bowel sounds active in all 4 quadrants, non-tender, non-distended Extremities: warm/dry, 3+ edema  Skin: no rashes or lesions,   Resolved Hospital Problem list   Hypotension  Hyperkalemia   Assessment & Plan:   Acute on chronic hypoxic and hypercarbic resp failure secondary to  1) Acute exacerbation of COPD  2) HCAP suspected gp vs atypical, POA -2/2 copd exacerbation with acute suspected PNA, poa -Baseline oxygen equipment of 2-3L [place] P: Continue ventilator support with lung protective strategies  Wean PEEP and FiO2 for sats greater than 90%. Head of bed elevated 30 degrees. Plateau pressures less than 30 cm H20.  Follow intermittent chest x-ray and ABG.   SAT/SBT as tolerated, mentation preclude extubation  Ensure adequate pulmonary hygiene  Follow cultures  VAP bundle in place  Continue bronchodilators  IV steroids  Trend lactic acid  OSA noncompliant with CPAP   P: Continue vent support as above  Patient will ned further education on need for CPAP once stable  Acute decompensated HFpEF with pulmonary edema - Limited ECHO 11/4 unable to comment on valve structure or LVF function even with contrast used -ECHO  02/22/2019 preserved EF of 55-60% P: Continue to diurese as tolerated Daily weight  Strict intake and output  Resume home beta blocker, Imdur, statin,    Paroxysmal Atrial fibrillation   -History of DC-CV 04/2018 -rate controlled at this time on chronic Amiodarone -on chronic anticoagulation with Eliquis   P: Continue home anticoagulation  Continue home amiodarone  TSH WNL 03/2019 Continuous telemetry   CKD3 -cr baseline ~2.3 P: Continue to follow renal function / urine output Trend Bmet Avoid nephrotoxins, ensure adequate renal perfusion   Chronic macrocytic anemia P: Trend CBC  Transfuse per protocol  No bleeding noted   Type 2 diabetes with hyperglycemia:  P: SSI CBG checks ACHS Hypoglycemia protocol as indicated   At risk malnutrition P: Begin tube feeds Dietary consult   Best practice:  Diet: npo Pain/Anxiety/Delirium protocol (if indicated): Fentanyl  VAP protocol (if indicated): in place  DVT prophylaxis: heparin GI prophylaxis: famotidine Glucose control: ssi Mobility: bed rest Code Status: FULL, previously was dnr so this will need to be addressed with family when able Family Communication: will attempt to contact family today  Disposition: ICU  Labs   CBC: Recent Labs  Lab 06/25/19 0845 06/25/19 1316 06/26/19 0514  WBC 9.7  --  10.9*  NEUTROABS 6.4  --   --   HGB 11.8* 12.9* 11.0*  HCT 43.2 38.0* 38.3*  MCV 104.6*  --  99.2  PLT 201  --  401    Basic Metabolic Panel: Recent Labs  Lab 06/25/19 0845 06/25/19 1059 06/25/19 1316 06/26/19 0514  NA 144 140 142 144  K 5.2* 5.9* 5.3* 4.3  CL 103 106  --  105  CO2 31 25  --  28  GLUCOSE 182* 212*  --  195*  BUN 42* 45*  --  50*  CREATININE 2.28* 2.44*  --  2.65*  CALCIUM 9.3 8.7*  --  9.6  MG  --   --   --  2.4   GFR: Estimated Creatinine Clearance: 37.5 mL/min (A) (by C-G formula based on SCr of 2.65 mg/dL (H)). Recent Labs  Lab 06/25/19 0845 06/25/19 1059 06/25/19 1216 06/25/19  1534 06/25/19 1941 06/26/19 0514  PROCALCITON  --  0.10  --   --   --   --   WBC 9.7  --   --   --   --  10.9*  LATICACIDVEN  --   --  1.3 2.2* 3.1*  --     Liver Function Tests: No results for input(s): AST, ALT, ALKPHOS, BILITOT, PROT, ALBUMIN in the last 168 hours. No results for input(s): LIPASE, AMYLASE in the last 168 hours. No results for input(s): AMMONIA in the last 168 hours.  ABG    Component Value Date/Time   PHART 7.310 (L) 06/25/2019 1316   PCO2ART 68.8 (HH) 06/25/2019 1316   PO2ART 57.0 (L) 06/25/2019 1316   HCO3 34.7 (H) 06/25/2019 1316   TCO2 37 (H) 06/25/2019 1316   ACIDBASEDEF 1.0 02/16/2016  2031   O2SAT 85.0 06/25/2019 1316     Coagulation Profile: No results for input(s): INR, PROTIME in the last 168 hours.  Cardiac Enzymes: No results for input(s): CKTOTAL, CKMB, CKMBINDEX, TROPONINI in the last 168 hours.  HbA1C: Hgb A1c MFr Bld  Date/Time Value Ref Range Status  04/14/2019 04:37 AM 7.8 (H) 4.8 - 5.6 % Final    Comment:    (NOTE) Pre diabetes:          5.7%-6.4% Diabetes:              >6.4% Glycemic control for   <7.0% adults with diabetes   03/19/2019 10:32 AM 8.8 (H) 4.6 - 6.5 % Final    Comment:    Glycemic Control Guidelines for People with Diabetes:Non Diabetic:  <6%Goal of Therapy: <7%Additional Action Suggested:  >8%     CBG: Recent Labs  Lab 06/25/19 1503 06/25/19 1912 06/25/19 2308 06/26/19 0308 06/26/19 0726  GLUCAP 202* 197* 175* 163* 175*     Critical care time:    Performed by: Johnsie Cancel   Total critical care time: 40 minutes  Critical care time was exclusive of separately billable procedures and treating other patients.  Critical care was necessary to treat or prevent imminent or life-threatening deterioration.  Critical care was time spent personally by me on the following activities: development of treatment plan with patient and/or surrogate as well as nursing, discussions with consultants, evaluation  of patient's response to treatment, examination of patient, obtaining history from patient or surrogate, ordering and performing treatments and interventions, ordering and review of laboratory studies, ordering and review of radiographic studies, pulse oximetry and re-evaluation of patient's condition.   Signature:   Johnsie Cancel, NP-C [facility] After hours pager: [phone]. 06/26/2019, 8:15 AM  Attending Note:  58 year old male with COPD and CHF who presents to PCCM with acute respiratory failure requiring intubation.  Overnight, intubated and requiring high PEEP and FiO2.  On exam, diffuse crackles.  I reviewed CXR myself, ETT is in a good position and pulmonary edema noted.  Discussed with PCCM-NP.  Continue steroids IV for now.  Start lasix and zaroxolyn.  May need to place an a-line for BP monitoring.  Titrate FiO2 and PEEP for sat of 8-92%.  Continue abx.  PCCM will continue to manage.  The patient is critically ill with multiple organ systems failure and requires high complexity decision making for assessment and support, frequent evaluation and titration of therapies, application of advanced monitoring technologies and extensive interpretation of multiple databases.   Critical Care Time devoted to patient care services described in this note is  32  Minutes. This time reflects time of care of this signee Dr Jennet Maduro. This critical care time does not reflect procedure time, or teaching time or supervisory time of PA/NP/Med student/Med Resident etc but could involve care discussion time.  Rush Farmer, M.D. [facility] Pulmonary/Critical Care Medicine.

## 2019-06-26 NOTE — Progress Notes (Signed)
CSW spoke with Olivia Mackie at [facility] who states this patient did not leave AMA - but instead he was discharged home with home health services in place through [facility].  TOC team will continue following for discharge needs.  Madilyn Fireman, MSW, LCSW-A Transitions of Care  Clinical Social Worker  [facility] Emergency Departments  Medical ICU [phone]

## 2019-06-26 NOTE — Progress Notes (Signed)
Initial Nutrition Assessment RD working remotely.  DOCUMENTATION CODES:   Obesity unspecified  INTERVENTION:   Begin TF via OGT:   Vital AF 1.2 at 25 ml/h, increase by 10 ml every 8 hours to goal rate of 45 ml/h (1080 ml per day)   Pro-stat 30 ml 5 times per day   Provides 1796 kcal, 156 gm protein, 876 ml free water daily   Monitor magnesium, potassium, and phosphorus, MD to replete as needed, as pt is at risk for refeeding syndrome given severe 27% weight loss over the past 3 months, likely minimal intake recently.  NUTRITION DIAGNOSIS:   Inadequate oral intake related to inability to eat as evidenced by NPO status.  GOAL:   Provide needs based on ASPEN/SCCM guidelines  MONITOR:   Vent status, TF tolerance, Labs, Skin  REASON FOR ASSESSMENT:   Ventilator    ASSESSMENT:   58 yo male admitted with SOB x 3 days requiring intubation on admission. PMH includes CAD, ischemic cardiomyopathy, obesity, CHF, HTN, DM, LE edema, brain tumor s/p resection 2014.    Patient is currently intubated on ventilator support MV: 13.5 L/min Temp (24hrs), Avg:99.1 F (37.3 C), Min:94.3 F (34.6 C), Max:101.3 F (38.5 C)   Labs reviewed. BUN 50 (H), creatinine 2.65 (H) CBG's: 163-175  Medications reviewed and include lasix, neosynephrine, novolog, solu-medrol.  Per review of usual weights, patient has lost 27% of usual weight within the past 3 months, which is significant for the time frame.   Per discussion with CCM NP, okay to begin TF today, RD to order.  NUTRITION - FOCUSED PHYSICAL EXAM:  unable to complete  Diet Order:   Diet Order            Diet NPO time specified  Diet effective now              EDUCATION NEEDS:   Not appropriate for education at this time  Skin:  Skin Assessment: Reviewed RN Assessment(MASD to groin)  Last BM:  no BM documented  Height:   Ht Readings from Last 1 Encounters:  06/25/19 5\' 11"  (1.803 m)    Weight:   Wt  Readings from Last 1 Encounters:  06/25/19 105 kg    Ideal Body Weight:  78.2 kg  BMI:  Body mass index is 32.29 kg/m.  Estimated Nutritional Needs:   Kcal:  1550-1750  Protein:  156 gm  Fluid:  >/= 1.9 L    Molli Barrows, RD, LDN, [place] Pager [phone] After Hours Pager [phone]

## 2019-06-26 NOTE — Progress Notes (Signed)
Pt wife took all of patients belongings home with her.

## 2019-06-26 NOTE — Progress Notes (Signed)
[place] Progress Note Patient Name: [name]. DOB: 1960-08-28 MRN: [id number]   Date of Service  06/26/2019  HPI/Events of Note  Fever to 101.1 F. Patient already had Blood, urine and tracheal aspirate cultures 06/25/2019. Currently on Vancomycin and Cefepime. AST and ALT normal.   eICU Interventions  Will order: 1. Tylenol liquid 650 mg per tube Q 6 hours PRN Temp > 101.0 F.     Intervention Category Major Interventions: Infection - evaluation and management  Sommer,Steven Eugene 06/26/2019, 12:03 AM

## 2019-06-26 NOTE — Progress Notes (Signed)
[place] Progress Note Patient Name: [name]. DOB: 01-01-1961 MRN: [id number]   Date of Service  06/26/2019  HPI/Events of Note  Notified of glucose 200s on low dose SSI and tube feeding. Creatinine 2.65  eICU Interventions  Increased to moderate dose SSI     Intervention Category Major Interventions: Hyperglycemia - active titration of insulin therapy  Shona Needles Shelah Heatley 06/26/2019, 8:02 PM

## 2019-06-26 NOTE — Consult Note (Signed)
   [facility] CM Inpatient Consult   06/26/2019  [name]. May 12, 1961 [id number]    HTA-CSNP:  Alerted by [facility] RN CM of patient's readmission.  Patient is currently active with [facility]eds [facility]. Our community based plan of care has focused on disease managementand community resource support.   Per chart review and MD notes reveal as follows: 58 y/o with h/o COPD/CHF, presented with SOB x 3 days requiring Intubation- 11/4.     Plan:Will follow progress and disposition.  Will update [facility] RN Chronic care management coordinator of patient's disposition/ needs. Patient will receive a post hospital call and will be evaluated for assessments and disease process education.  Of note, [facility] Care Management services does not replace or interfere with any services that are needed or arranged by inpatient [facility] care management team.   For additional information and questions, please contact:  Edwena Felty A. Ikeya Brockel, BSN, RN-BC [facility] Liaison Cell: [phone]

## 2019-06-27 ENCOUNTER — Inpatient Hospital Stay (HOSPITAL_COMMUNITY): Payer: HMO

## 2019-06-27 DIAGNOSIS — R579 Shock, unspecified: Secondary | ICD-10-CM

## 2019-06-27 LAB — POCT I-STAT 7, (LYTES, BLD GAS, ICA,H+H)
Acid-Base Excess: 8 mmol/L — ABNORMAL HIGH (ref 0.0–2.0)
Bicarbonate: 32.8 mmol/L — ABNORMAL HIGH (ref 20.0–28.0)
Calcium, Ion: 1.24 mmol/L (ref 1.15–1.40)
HCT: 34 % — ABNORMAL LOW (ref 39.0–52.0)
Hemoglobin: 11.6 g/dL — ABNORMAL LOW (ref 13.0–17.0)
O2 Saturation: 99 %
Patient temperature: 101.5
Potassium: 3.5 mmol/L (ref 3.5–5.1)
Sodium: 145 mmol/L (ref 135–145)
TCO2: 34 mmol/L — ABNORMAL HIGH (ref 22–32)
pCO2 arterial: 46.5 mmHg (ref 32.0–48.0)
pH, Arterial: 7.462 — ABNORMAL HIGH (ref 7.350–7.450)
pO2, Arterial: 132 mmHg — ABNORMAL HIGH (ref 83.0–108.0)

## 2019-06-27 LAB — BASIC METABOLIC PANEL
Anion gap: 14 (ref 5–15)
BUN: 71 mg/dL — ABNORMAL HIGH (ref 6–20)
CO2: 29 mmol/L (ref 22–32)
Calcium: 9.6 mg/dL (ref 8.9–10.3)
Chloride: 103 mmol/L (ref 98–111)
Creatinine, Ser: 2.89 mg/dL — ABNORMAL HIGH (ref 0.61–1.24)
GFR calc Af Amer: 27 mL/min — ABNORMAL LOW (ref >60)
GFR calc non Af Amer: 23 mL/min — ABNORMAL LOW (ref >60)
Glucose, Bld: 292 mg/dL — ABNORMAL HIGH (ref 70–99)
Potassium: 3.6 mmol/L (ref 3.5–5.1)
Sodium: 146 mmol/L — ABNORMAL HIGH (ref 135–145)

## 2019-06-27 LAB — CBC
HCT: 36.4 % — ABNORMAL LOW (ref 39.0–52.0)
Hemoglobin: 10.6 g/dL — ABNORMAL LOW (ref 13.0–17.0)
MCH: 28.1 pg (ref 26.0–34.0)
MCHC: 29.1 g/dL — ABNORMAL LOW (ref 30.0–36.0)
MCV: 96.6 fL (ref 80.0–100.0)
Platelets: 195 10*3/uL (ref 150–400)
RBC: 3.77 MIL/uL — ABNORMAL LOW (ref 4.22–5.81)
RDW: 15.6 % — ABNORMAL HIGH (ref 11.5–15.5)
WBC: 11.5 10*3/uL — ABNORMAL HIGH (ref 4.0–10.5)
nRBC: 0 % (ref 0.0–0.2)

## 2019-06-27 LAB — MAGNESIUM: Magnesium: 2.4 mg/dL (ref 1.7–2.4)

## 2019-06-27 LAB — URINE CULTURE: Culture: >=100000 — AB

## 2019-06-27 LAB — GLUCOSE, CAPILLARY
Glucose-Capillary: 281 mg/dL — ABNORMAL HIGH (ref 70–99)
Glucose-Capillary: 257 mg/dL — ABNORMAL HIGH (ref 70–99)
Glucose-Capillary: 238 mg/dL — ABNORMAL HIGH (ref 70–99)
Glucose-Capillary: 197 mg/dL — ABNORMAL HIGH (ref 70–99)
Glucose-Capillary: 223 mg/dL — ABNORMAL HIGH (ref 70–99)
Glucose-Capillary: 213 mg/dL — ABNORMAL HIGH (ref 70–99)

## 2019-06-27 LAB — PHOSPHORUS: Phosphorus: 2.5 mg/dL (ref 2.5–4.6)

## 2019-06-27 MED ORDER — ORAL CARE MOUTH RINSE
15.0000 mL | Freq: Two times a day (BID) | OROMUCOSAL | Status: DC
  Administered 2019-06-27 – 2019-07-04 (×11): 15 mL via OROMUCOSAL

## 2019-06-27 MED ORDER — HYPROMELLOSE (GONIOSCOPIC) 2.5 % OP SOLN
1.0000 [drp] | Freq: Three times a day (TID) | OPHTHALMIC | Status: DC | PRN
  Administered 2019-06-28 – 2019-07-04 (×11): 1 [drp] via OPHTHALMIC
  Filled 2019-06-27: qty 15

## 2019-06-27 MED ORDER — CARVEDILOL 12.5 MG PO TABS
12.5000 mg | ORAL_TABLET | Freq: Two times a day (BID) | ORAL | Status: DC
  Administered 2019-06-27 – 2019-07-04 (×15): 12.5 mg via ORAL
  Filled 2019-06-27 (×15): qty 1

## 2019-06-27 MED ORDER — METOPROLOL TARTRATE 5 MG/5ML IV SOLN: 2.5000 mg | INTRAVENOUS | Status: DC | PRN

## 2019-06-27 MED ORDER — ROSUVASTATIN CALCIUM 20 MG PO TABS
20.0000 mg | ORAL_TABLET | Freq: Every day | ORAL | Status: DC
  Administered 2019-06-28 – 2019-07-03 (×6): 20 mg via ORAL
  Filled 2019-06-27 (×8): qty 1

## 2019-06-27 MED ORDER — POTASSIUM CHLORIDE 20 MEQ/15ML (10%) PO SOLN
40.0000 meq | Freq: Three times a day (TID) | ORAL | Status: AC
  Administered 2019-06-27: 40 meq
  Filled 2019-06-27 (×2): qty 30

## 2019-06-27 MED ORDER — FUROSEMIDE 10 MG/ML IJ SOLN
40.0000 mg | Freq: Three times a day (TID) | INTRAMUSCULAR | Status: AC
  Administered 2019-06-27 (×2): 40 mg via INTRAVENOUS
  Filled 2019-06-27 (×2): qty 4

## 2019-06-27 MED ORDER — INSULIN GLARGINE 100 UNIT/ML ~~LOC~~ SOLN
25.0000 [IU] | Freq: Every day | SUBCUTANEOUS | Status: DC
  Administered 2019-06-27 – 2019-07-03 (×7): 25 [IU] via SUBCUTANEOUS
  Filled 2019-06-27 (×8): qty 0.25

## 2019-06-27 NOTE — Procedures (Signed)
Extubation Procedure Note  Patient Details:   Name: [name]. DOB: 10-14-1960 MRN: [id number]   Airway Documentation:    Vent end date: 06/27/19 Vent end time: 1104   Evaluation  O2 sats: stable throughout Complications: No apparent complications Patient did tolerate procedure well. Bilateral Breath Sounds: Diminished   Yes  4l/min Bell[place] placed BBS equal, clear, diminished in the bases. Incentive spirometer at bedside.  [name] Standard 06/27/2019, 11:04 AM

## 2019-06-27 NOTE — Progress Notes (Signed)
[place] Progress Note Patient Name: [name]. DOB: Nov 05, 1960 MRN: [id number]   Date of Service  06/27/2019  HPI/Events of Note  Request for Bipap q hs as this was discussed during rounds  eICU Interventions  Patient has OSA. BiPap 10/5 q hs reasonable and ordered     Intervention Category Intermediate Interventions: Other:  Judd Lien 06/27/2019, 11:15 PM

## 2019-06-27 NOTE — Progress Notes (Addendum)
Patient is well known to palliative care service. Multiple goals of care discussions have been had with [name] and his wife has been included in many of these discussions. Low health literacy and barriers to communication. Patient has indicated a desire to not be intubated or receive CPR in the event of cardiac or respiratory arrest. He is intermittently compliant with his home trilogy machine. This is his 6th inpatient admission in 6 months and several have included intubation. On his last intubation he was extubated to full comfort care and was going to go to a hospice facility, but he stabilized and improved enough to go to SNF. His wife cannot care for him at home because she works during the day, but he insists on being at home but struggles to take his medications including diuretics and DM meds, he is intermittently using his trilogy machine. He is on hydrocodone for chronic neuropathic pain.   Discussed case with Dr. Nelda Marseille. Recommend extubation with a focus on comfort and minimally invasive medical interventions (diurese for comfort and continue essential medications, no escalation)-if he deteriorates will transition into hospice care possibly a hospice facility vs. Home with hospice. Will contact his wife today to discuss his condition and next steps.  Lane Hacker, DO Palliative Medicine

## 2019-06-27 NOTE — H&P (Addendum)
NAME:  [name]., MRN:  [id number], DOB:  08-Dec-1960, LOS: 2 ADMISSION DATE:  06/25/2019, CONSULTATION DATE:  06/25/19 REFERRING MD:  Dr Wilson Singer, CHIEF COMPLAINT:  sob  Brief History   58 yo with h/o COPD/CHF presented with SOB x3 days req. Intubation 11/4  History of present illness   58 yo male with pmh COPD, Chronic hypoxic/hypercapnic resp failure on 2-3L [place] at home, non adherent to CPAP, OSA, obesity CKD3-4, HFpEF who presents for the second month in a row with sob found to be hypoxic/hypercapnic resp failure failed NIV therapy and req intubation. CCM called to admit pt.   Pt was seen intubated and sedated. Per ED physician but was sob and altered, NIV was intitiated but pt cont to worsen and was more lethargic. He was intubated. After intubation pt became hypotensive req neo stick infusion. He was diffusely wheezing given bolus steroid in ed as neb started.   Past Medical History  Brain tumor CAD with prior MI HFpEF Type 2 diabetes  Fibromyalgia Gout Hypertension Obesity COPD  OSA noncompliant CPAP  Significant Hospital Events   11/4 Admitted and intubated  Consults:  PCCM  Procedures:  ETT 11/4 >>   Significant Diagnostic Tests:  CXR 11/4 >> Extensive bilateral infiltrates, with diffuse progression on right and progression at base since prior study, chronic cardiomegaly    Micro Data:  COVID 11/4 >> neg Urine culture 11/4 >> Klebsiella Pneumoniae, susceptibilities pending  Blood culture 11/4 >> RVP >> neg Sputum culture 11/4 >>   Antimicrobials:  Vanc 11/4 >> Cefepime 11/4 >>  Interim history/subjective:  11/4: pt was intubated and sedated today after presentation.  11/5: RN reports no acute events overnight, he is lying in bed on vent in in no acute distress. Diuresed with with IV lasix yesterday 1.7L removed.  11/6 No acute events overnight, remains alert and interactive on vent. Good response to diuretics with 4.7L urine removed in the last  24hrs.   Objective   Blood pressure 109/72, pulse 93, temperature 98.8 F (37.1 C), resp. rate (!) 24, height 5\' 11"  (1.803 m), weight 105 kg, SpO2 94 %.    Vent Mode: PRVC FiO2 (%):  [50 %] 50 % Set Rate:  [24 bmp] 24 bmp Vt Set:  [600 mL] 600 mL PEEP:  [8 cmH20] 8 cmH20 Plateau Pressure:  [26 cmH20-33 cmH20] 28 cmH20   Intake/Output Summary (Last 24 hours) at 06/27/2019 0730 Last data filed at 06/27/2019 0600 Gross per 24 hour  Intake 739.84 ml  Output 4070 ml  Net -3330.16 ml   Filed Weights   06/25/19 0819  Weight: 105 kg    Examination: General: Chronically ill appearing elderly male on mechanical ventilation, in NAD HEENT: ETT, MM pink/moist, PERRL,  Neuro: Alert and interactive on vent, able to follow simple commands, no focal deficits  CV: s1s2 regular rate and rhythm, no murmur, rubs, or gallops,  PULM:  No added breath sounds, no increased work of breathing, tolerating vent well  GI: soft, bowel sounds active in all 4 quadrants, non-tender, non-distended, tolerating TF Extremities: warm/dry, 2+ edema  Skin: no rashes or lesions  Resolved Hospital Problem list   Hypotension  Hyperkalemia   Assessment & Plan:   Acute on chronic hypoxic and hypercarbic resp failure secondary to  1) Acute exacerbation of COPD  2) HCAP suspected gp vs atypical, POA -2/2 copd exacerbation with acute suspected PNA, poa -Baseline oxygen equipment of 2-3L [place] P: Continue ventilator support with lung  protective strategies  If SBT tolerated may be able to extubate soon  Wean PEEP and FiO2 for sats greater than 90%. Head of bed elevated 30 degrees. Plateau pressures less than 30 cm H20.  Follow intermittent chest x-ray and ABG.   SAT/SBT as tolerated, mentation preclude extubation  Ensure adequate pulmonary hygiene  Follow cultures  VAP bundle in place Continue IV steroid taper  Continue bronchodilators   OSA noncompliant with CPAP   P: Continue vent support as above   Education on need for compliance for CPAP once stable   Acute decompensated HFpEF with pulmonary edema - Limited ECHO 11/4 unable to comment on valve structure or LVF function even with contrast used -ECHO 02/22/2019 preserved EF of 55-60% P: Diuresed well 11/5, slight bump in creatinine  Daily weight Strict intake and output Continue beta blocker, Imdur, and statin   Paroxysmal Atrial fibrillation   -History of DC-CV 04/2018 -rate controlled at this time on chronic Amiodarone -on chronic anticoagulation with Eliquis   P: Continue home anticoagulation Continue home amiodarone  Continuous telemetry   CKD3 -cr baseline ~2.3 P: Follow renal function daily / urine output Trend Bmet Avoid nephrotoxins, ensure renal perfusion   Chronic macrocytic anemia P: Trend CBC Transfuse per protocol  Type 2 diabetes with hyperglycemia:  P: Resume home Lantus  Moderate SSI CBG checks ACHS Hypoglycemia protocol as indicated   At risk malnutrition P: Tube feeds  Dietary consult    Best practice:  Diet: Tube feeds  Pain/Anxiety/Delirium protocol (if indicated): Fentanyl  VAP protocol (if indicated): in place  DVT prophylaxis: heparin GI prophylaxis: famotidine Glucose control: ssi Mobility: bed rest Code Status: FULL, previously was dnr so this will need to be addressed with family when able Family Communication: Wife updated 11/5, wiill attempt to update again today  Disposition: ICU  Labs   CBC: Recent Labs  Lab 06/25/19 0845 06/25/19 1316 06/26/19 0514 06/27/19 0439 06/27/19 0501  WBC 9.7  --  10.9* 11.5*  --   NEUTROABS 6.4  --   --   --   --   HGB 11.8* 12.9* 11.0* 10.6* 11.6*  HCT 43.2 38.0* 38.3* 36.4* 34.0*  MCV 104.6*  --  99.2 96.6  --   PLT 201  --  215 195  --     Basic Metabolic Panel: Recent Labs  Lab 06/25/19 0845 06/25/19 1059 06/25/19 1316 06/26/19 0514 06/27/19 0439 06/27/19 0501  NA 144 140 142 144 146* 145  K 5.2* 5.9* 5.3* 4.3 3.6 3.5   CL 103 106  --  105 103  --   CO2 31 25  --  28 29  --   GLUCOSE 182* 212*  --  195* 292*  --   BUN 42* 45*  --  50* 71*  --   CREATININE 2.28* 2.44*  --  2.65* 2.89*  --   CALCIUM 9.3 8.7*  --  9.6 9.6  --   MG  --   --   --  2.4 2.4  --   PHOS  --   --   --   --  2.5  --    GFR: Estimated Creatinine Clearance: 34.4 mL/min (A) (by C-G formula based on SCr of 2.89 mg/dL (H)). Recent Labs  Lab 06/25/19 0845 06/25/19 1059 06/25/19 1216 06/25/19 1534 06/25/19 1941 06/26/19 0514 06/26/19 0821 06/27/19 0439  PROCALCITON  --  0.10  --   --   --   --   --   --  WBC 9.7  --   --   --   --  10.9*  --  11.5*  LATICACIDVEN  --   --  1.3 2.2* 3.1*  --  1.8  --     Liver Function Tests: No results for input(s): AST, ALT, ALKPHOS, BILITOT, PROT, ALBUMIN in the last 168 hours. No results for input(s): LIPASE, AMYLASE in the last 168 hours. No results for input(s): AMMONIA in the last 168 hours.  ABG    Component Value Date/Time   PHART 7.462 (H) 06/27/2019 0501   PCO2ART 46.5 06/27/2019 0501   PO2ART 132.0 (H) 06/27/2019 0501   HCO3 32.8 (H) 06/27/2019 0501   TCO2 34 (H) 06/27/2019 0501   ACIDBASEDEF 1.0 02/16/2016 2031   O2SAT 99.0 06/27/2019 0501     Coagulation Profile: No results for input(s): INR, PROTIME in the last 168 hours.  Cardiac Enzymes: No results for input(s): CKTOTAL, CKMB, CKMBINDEX, TROPONINI in the last 168 hours.  HbA1C: Hgb A1c MFr Bld  Date/Time Value Ref Range Status  04/14/2019 04:37 AM 7.8 (H) 4.8 - 5.6 % Final    Comment:    (NOTE) Pre diabetes:          5.7%-6.4% Diabetes:              >6.4% Glycemic control for   <7.0% adults with diabetes   03/19/2019 10:32 AM 8.8 (H) 4.6 - 6.5 % Final    Comment:    Glycemic Control Guidelines for People with Diabetes:Non Diabetic:  <6%Goal of Therapy: <7%Additional Action Suggested:  >8%     CBG: Recent Labs  Lab 06/26/19 1456 06/26/19 1918 06/26/19 2311 06/27/19 0324 06/27/19 0722  GLUCAP  206* 226* 276* 281* 257*     Critical care time:    Performed by: Johnsie Cancel   Total critical care time: 40 minutes  Critical care time was exclusive of separately billable procedures and treating other patients.  Critical care was necessary to treat or prevent imminent or life-threatening deterioration.  Critical care was time spent personally by me on the following activities: development of treatment plan with patient and/or surrogate as well as nursing, discussions with consultants, evaluation of patient's response to treatment, examination of patient, obtaining history from patient or surrogate, ordering and performing treatments and interventions, ordering and review of laboratory studies, ordering and review of radiographic studies, pulse oximetry and re-evaluation of patient's condition.   Signature:   Johnsie Cancel, NP-C [facility] After hours pager: [phone]. 06/27/2019, 7:30 AM  Attending Note:  58 year old male with COPD and CHF who was intubated for hypoxemic respiratory failure.  Overnight, the patient diuresed well.  On exam, able to tolerate some weaning this AM with coarse BS diffusely.  I reviewed CXR myself, ETT is in a good position with improving but persistent pulmonary edema.  Discussed with PCCM-NP.  Will decrease lasix dose but continue diureses.  Replace K.  BMET in AM to monitor renal function.  Will attempt extubation today but continue diureses for the meantime.  PCCM will continue to manage.  On afternoon rounds, patient is extubated but remains confused.  Unable to have an EOL discussion with him at this time.  Palliative speaking with wife.  The patient is critically ill with multiple organ systems failure and requires high complexity decision making for assessment and support, frequent evaluation and titration of therapies, application of advanced monitoring technologies and extensive interpretation of multiple databases.    Critical  Care Time devoted to patient care services described in this note is  33  Minutes. This time reflects time of care of this signee Dr Jennet Maduro. This critical care time does not reflect procedure time, or teaching time or supervisory time of PA/NP/Med student/Med Resident etc but could involve care discussion time.  Rush Farmer, M.D. [facility] Pulmonary/Critical Care Medicine.

## 2019-06-27 NOTE — Progress Notes (Signed)
[place] Progress Note Patient Name: [name]. DOB: [birth year] MRN: [id number]   Date of Service  06/27/2019  HPI/Events of Note  Notified of HR 130s to 140s. On camera assessment HR 110s and patient sedated adequately. BP 131/72 on amiodarone 100 once daily for paroxysmal afib  eICU Interventions  Ordered metoprolol 2.5 - 5 mg IV q 3 prn for HR > 120     Intervention Category Major Interventions: Arrhythmia - evaluation and management  Shona Needles Zarius Furr 06/27/2019, 12:24 AM

## 2019-06-28 ENCOUNTER — Inpatient Hospital Stay (HOSPITAL_COMMUNITY): Payer: HMO

## 2019-06-28 DIAGNOSIS — R600 Localized edema: Secondary | ICD-10-CM

## 2019-06-28 DIAGNOSIS — E8770 Fluid overload, unspecified: Secondary | ICD-10-CM

## 2019-06-28 DIAGNOSIS — Z9989 Dependence on other enabling machines and devices: Secondary | ICD-10-CM

## 2019-06-28 DIAGNOSIS — J9601 Acute respiratory failure with hypoxia: Secondary | ICD-10-CM

## 2019-06-28 DIAGNOSIS — Z7189 Other specified counseling: Secondary | ICD-10-CM

## 2019-06-28 DIAGNOSIS — Z515 Encounter for palliative care: Secondary | ICD-10-CM

## 2019-06-28 DIAGNOSIS — N179 Acute kidney failure, unspecified: Secondary | ICD-10-CM

## 2019-06-28 DIAGNOSIS — I480 Paroxysmal atrial fibrillation: Secondary | ICD-10-CM

## 2019-06-28 DIAGNOSIS — G8929 Other chronic pain: Secondary | ICD-10-CM

## 2019-06-28 DIAGNOSIS — G4733 Obstructive sleep apnea (adult) (pediatric): Secondary | ICD-10-CM

## 2019-06-28 LAB — BASIC METABOLIC PANEL
Anion gap: 15 (ref 5–15)
BUN: 83 mg/dL — ABNORMAL HIGH (ref 6–20)
CO2: 33 mmol/L — ABNORMAL HIGH (ref 22–32)
Calcium: 8.8 mg/dL — ABNORMAL LOW (ref 8.9–10.3)
Chloride: 101 mmol/L (ref 98–111)
Creatinine, Ser: 2.81 mg/dL — ABNORMAL HIGH (ref 0.61–1.24)
GFR calc Af Amer: 27 mL/min — ABNORMAL LOW (ref >60)
GFR calc non Af Amer: 24 mL/min — ABNORMAL LOW (ref >60)
Glucose, Bld: 213 mg/dL — ABNORMAL HIGH (ref 70–99)
Potassium: 3.4 mmol/L — ABNORMAL LOW (ref 3.5–5.1)
Sodium: 149 mmol/L — ABNORMAL HIGH (ref 135–145)

## 2019-06-28 LAB — CBC
HCT: 38 % — ABNORMAL LOW (ref 39.0–52.0)
Hemoglobin: 11 g/dL — ABNORMAL LOW (ref 13.0–17.0)
MCH: 28.4 pg (ref 26.0–34.0)
MCHC: 28.9 g/dL — ABNORMAL LOW (ref 30.0–36.0)
MCV: 97.9 fL (ref 80.0–100.0)
Platelets: 202 10*3/uL (ref 150–400)
RBC: 3.88 MIL/uL — ABNORMAL LOW (ref 4.22–5.81)
RDW: 15.4 % (ref 11.5–15.5)
WBC: 11.3 10*3/uL — ABNORMAL HIGH (ref 4.0–10.5)
nRBC: 0 % (ref 0.0–0.2)

## 2019-06-28 LAB — PHOSPHORUS: Phosphorus: 4.4 mg/dL (ref 2.5–4.6)

## 2019-06-28 LAB — GLUCOSE, CAPILLARY
Glucose-Capillary: 195 mg/dL — ABNORMAL HIGH (ref 70–99)
Glucose-Capillary: 178 mg/dL — ABNORMAL HIGH (ref 70–99)
Glucose-Capillary: 199 mg/dL — ABNORMAL HIGH (ref 70–99)
Glucose-Capillary: 181 mg/dL — ABNORMAL HIGH (ref 70–99)
Glucose-Capillary: 231 mg/dL — ABNORMAL HIGH (ref 70–99)

## 2019-06-28 LAB — MAGNESIUM: Magnesium: 2.6 mg/dL — ABNORMAL HIGH (ref 1.7–2.4)

## 2019-06-28 MED ORDER — METHYLPREDNISOLONE SODIUM SUCC 40 MG IJ SOLR
40.0000 mg | INTRAMUSCULAR | Status: DC
  Administered 2019-06-29 – 2019-06-30 (×2): 40 mg via INTRAVENOUS
  Filled 2019-06-28 (×2): qty 1

## 2019-06-28 MED ORDER — ARFORMOTEROL TARTRATE 15 MCG/2ML IN NEBU
15.0000 ug | INHALATION_SOLUTION | Freq: Two times a day (BID) | RESPIRATORY_TRACT | Status: DC
  Administered 2019-06-28 – 2019-07-04 (×13): 15 ug via RESPIRATORY_TRACT
  Filled 2019-06-28 (×14): qty 2

## 2019-06-28 MED ORDER — MAGNESIUM SULFATE 2 GM/50ML IV SOLN: 2.0000 g | Freq: Once | INTRAVENOUS | Status: DC

## 2019-06-28 MED ORDER — HYDROCODONE-ACETAMINOPHEN 10-325 MG PO TABS
1.0000 | ORAL_TABLET | Freq: Four times a day (QID) | ORAL | Status: DC
  Administered 2019-06-28 – 2019-07-04 (×24): 1 via ORAL
  Filled 2019-06-28 (×24): qty 1

## 2019-06-28 MED ORDER — BUDESONIDE 0.25 MG/2ML IN SUSP
0.2500 mg | Freq: Two times a day (BID) | RESPIRATORY_TRACT | Status: DC
  Administered 2019-06-28 – 2019-07-04 (×11): 0.25 mg via RESPIRATORY_TRACT
  Filled 2019-06-28 (×11): qty 2

## 2019-06-28 MED ORDER — POTASSIUM CHLORIDE 10 MEQ/100ML IV SOLN
10.0000 meq | INTRAVENOUS | Status: DC
  Administered 2019-06-28: 10 meq via INTRAVENOUS
  Filled 2019-06-28: qty 100

## 2019-06-28 MED ORDER — FUROSEMIDE 10 MG/ML IJ SOLN
40.0000 mg | Freq: Three times a day (TID) | INTRAMUSCULAR | Status: AC
  Administered 2019-06-28 – 2019-06-29 (×4): 40 mg via INTRAVENOUS
  Filled 2019-06-28 (×4): qty 4

## 2019-06-28 MED ORDER — POTASSIUM CHLORIDE CRYS ER 20 MEQ PO TBCR
40.0000 meq | EXTENDED_RELEASE_TABLET | Freq: Two times a day (BID) | ORAL | Status: AC
  Administered 2019-06-28 (×2): 40 meq via ORAL
  Filled 2019-06-28 (×2): qty 2

## 2019-06-28 NOTE — TOC Progression Note (Signed)
Transition of Care (TOC) - Progression Note    Patient Details  Name: [name]. MRN: [id number] Date of Birth: Oct 27, 1960  Transition of Care [facility]) CM/SW [place], [place] Phone Number: 06/28/2019, 3:57 PM  Clinical Narrative:    Pt active with [facility] Rec for [employer]. Deactivated banner as pt does not have a court appointed legal guardian.    Expected Discharge Plan: [place] Barriers to Discharge: Continued Medical Work up  Expected Discharge Plan and Services Expected Discharge Plan: [place] arrangements for the past 2 months: Single Family Home   Social Determinants of Health (SDOH) Interventions    Readmission Risk Interventions Readmission Risk Prevention Plan 06/26/2019 04/24/2019 04/18/2019  Transportation Screening Complete Complete Complete  PCP or Specialist Appt within 3-5 Days - - -  Not Complete comments - - -  HRI or [place] Work Consult for [place] Planning/Counseling - - -  Palliative Care Screening - - -  Medication Review Press photographer) - Complete Complete  PCP or Specialist appointment within 3-5 days of discharge - (No Data) Patient refused  [place] or [place] - Complete -  Palliative Care Screening (No Data) Complete -  [place] (No Data) Not Applicable -  Some recent data might be hidden

## 2019-06-28 NOTE — Progress Notes (Signed)
ABG order written yesterday morning while patient was still vented. ABG was within defined limits. Will d/c ABG at this time as pt was extubated yesterday. VSS. RT notified. Will continue to monitor.

## 2019-06-28 NOTE — Evaluation (Signed)
Clinical/Bedside Swallow Evaluation Patient Details  Name: [name]. MRN: [id number] Date of Birth: [birth year]  Today's Date: 06/28/2019 Time: SLP Start Time (ACUTE ONLY): 0945 SLP Stop Time (ACUTE ONLY): 1005 SLP Time Calculation (min) (ACUTE ONLY): 20 min  Past Medical History:  Past Medical History:  Diagnosis Date  . Brain tumor ([place])    Takes Topamax so patient will not have headaches  . Bruises easily   . CAD (coronary artery disease)    Cath 09, 99% LAD  . Cardiomyopathy, ischemic    Ischemic  . CHF (congestive heart failure) ([place])   . Cyst near coccyx   . Cyst near tailbone   . Diabetes mellitus without complication (HCC)    borderline  . Edema of both legs    Takes Lasix  . Fibromyalgia   . Gout   . Headache(784.0)    with lights  . Hypertension    dr Percival Spanish  . Kidney stones   . Myocardial infarction ([place])   . Obesity   . Pneumonia    hx of  . Shortness of breath   . Stroke [facility])    Affected vision, walking, and facial drooping on right face   Past Surgical History:  Past Surgical History:  Procedure Laterality Date  . 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 ANAL FISTULECTOMY    . CARDIOVERSION N/A 05/08/2018   Procedure: CARDIOVERSION;  Surgeon: [name], MD;  Location: [facility] ENDOSCOPY;  Service: Cardiovascular;  Laterality: N/A;  . CORONARY ARTERY BYPASS GRAFT     LIMA to LAD 2009  . CRANIOTOMY Right 11/20/2012   Procedure: CRANIOTOMY REPAIR DURAL/CENTRAL SPINAL FLUID LEAK;  Surgeon: [name], MD;  Location: [place] NEURO ORS;  Service: Neurosurgery;  Laterality: Right;  . EYE SURGERY     to coorect lid and double vision  . MEDIAN RECTUS REPAIR Right 02/16/2016   Procedure: RIGHT LATERAL RECTUS RESECTION; SUPERIOR RECTUS RESECTION RIGHT EYE; RIGHT SUPERIOR RECTUS RECESSION; LATERAL TARSAL STRIP RIGHT LOWER  EYELID;  Surgeon: [name], MD;  Location: [facility];  Service: Ophthalmology;  Laterality: Right;  . PLACEMENT OF LUMBAR DRAIN N/A 11/20/2012   Procedure: Attempted PLACEMENT OF LUMBAR DRAIN;  Surgeon: [name], MD;  Location: [place] NEURO ORS;  Service: Neurosurgery;  Laterality: N/A;  . RETROSIGMOID CRANIECTOMY FOR TUMOR RESECTION Right 10/11/2012   Procedure: RETROSIGMOID CRANIECTOMY FOR TUMOR RESECTION;  Surgeon: [name], MD;  Location: [place] NEURO ORS;  Service: Neurosurgery;  Laterality: Right;  Craniotomy for acoustic neuroma  . TEE WITHOUT CARDIOVERSION N/A 05/08/2018   Procedure: TRANSESOPHAGEAL ECHOCARDIOGRAM (TEE);  Surgeon: [name], MD;  Location: [facility] ENDOSCOPY;  Service: Cardiovascular;  Laterality: N/A;  . TRACHEOSTOMY TUBE PLACEMENT N/A 10/11/2012   Procedure: TRACHEOSTOMY;  Surgeon: Ascencion Dike, MD;  Location: [facility] NEURO ORS;  Service: ENT;  Laterality: N/A;  . UMBILICAL HERNIA REPAIR     HPI:  58 yo male with pmh COPD, Chronic hypoxic/hypercapnic resp failure on 2-3L [place] at home, non adherent to CPAP, OSA, obesity CKD3-4, HFpEF who presents for the second month in a row with sob found to be hypoxic/hypercapnic resp failure failed NIV therapy and req intubation. Pt intubated 11/4-11/6.    Assessment / Plan / Recommendation Clinical Impression   Pt presents with unchanged presentation from previous SLP reports of swallowing function over several years.  Pt's voice was strong and clear and he exhibited no overt s/s of aspiration with solids or liquids.  He compensated for baseline right sided oral motor weakness with only minimal intermittent anterior loss of liquids.  He is able to masticate regular solids without difficulty despite being edentulous.  Mild belching noted following straw sips of liquids.  Pt reports feeling "scared" of dry, regular textures at this time so I would suggest initiating a dys 3, thin liquids diet.  Suspect that he will be able to be advanced back  to regular textures quickly as he resumes a PO diet.    SLP Visit Diagnosis: Dysphagia, unspecified (R13.10)    Aspiration Risk  Mild aspiration risk    Diet Recommendation Dysphagia 3 (Mech soft);Thin liquid   Liquid Administration via: Cup;Straw Medication Administration: Whole meds with liquid Supervision: Intermittent supervision to cue for compensatory strategies Compensations: Slow rate;Small sips/bites;Follow solids with liquid Postural Changes: Seated upright at 90 degrees;Remain upright for at least 30 minutes after po intake    Other  Recommendations Oral Care Recommendations: Oral care BID   Follow up Recommendations None      Frequency and Duration min 2x/week          Prognosis Prognosis for Safe Diet Advancement: Fair Barriers to Reach Goals: Other (Comment)(prognosis)      Swallow Study   General HPI: 58 yo male with pmh COPD, Chronic hypoxic/hypercapnic resp failure on 2-3L [place] at home, non adherent to CPAP, OSA, obesity CKD3-4, HFpEF who presents for the second month in a row with sob found to be hypoxic/hypercapnic resp failure failed NIV therapy and req intubation. Pt intubated 11/4-11/6.  Type of Study: Bedside Swallow Evaluation Previous Swallow Assessment: 05/15/2019 Diet Prior to this Study: NPO Temperature Spikes Noted: No Respiratory Status: Nasal cannula History of Recent Intubation: Yes Length of Intubations (days): (11/4-11/6) Date extubated: 06/27/19 Behavior/Cognition: Alert;Cooperative;Distractible;Requires cueing Oral Care Completed by SLP: No Oral Cavity - Dentition: Edentulous Vision: Functional for self-feeding Self-Feeding Abilities: Able to feed self Patient Positioning: Upright in bed Baseline Vocal Quality: Normal    Oral/Motor/Sensory Function Overall Oral Motor/Sensory Function: Mild impairment Facial ROM: Reduced right Facial Symmetry: Abnormal symmetry right Lingual ROM: Reduced right Lingual Symmetry: Abnormal symmetry  right   Ice Chips Ice chips: Within functional limits   Thin Liquid Thin Liquid: Within functional limits    Nectar Thick     Honey Thick     Puree Puree: Within functional limits   Solid     Solid: Within functional limits      [name], Elmyra Ricks L 06/28/2019,10:50 AM

## 2019-06-28 NOTE — Progress Notes (Signed)
Patient transferred to 6N-04 from 9M. Received report from [place], Therapist, sports. Patient A&Ox4 on admission, on 4L O2/[place], and able to voice needs. Skin assessed with Davina Poke, RN. Moisture noted to buttocks and groin, skin tear on right lower leg, and generalized abrasions and bruises. Patient instructed on use of call bell and is able to verbalize needs. Wife at bedside. Will continue to monitor and treat per MD orders.

## 2019-06-28 NOTE — Progress Notes (Signed)
PCCM to [facility] transfer:  Patient with h/o COPD, Chronic hypoxic/hypercapnic resp failure on 2-3L [place] at home, non adherent to CPAP, OSA, obesity CKD3-4, HFpEF who presented on 11/4 with SOB 2* to COPD/CHF exacerbation, requiring intubation.  Has had frequent hospitalizations, with recurrent d/c to home on Hospice.  Cannot get SNF placement for hospice due to insurance, but home environment is suboptimal.  Recurrent terminal extubations with plan for no further intubation, but patient once again succeeds post-intubation.  Goal would be to get him placement in SNF/Hospice.  Was given Lasix today with neb changes.  Worsening renal function, not an HD candidate.  Will transfer to the floor on the [facility] service as of 11/8.   Carlyon Shadow, M.D.

## 2019-06-28 NOTE — Evaluation (Signed)
Occupational Therapy Evaluation Patient Details Name: [name]. 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 Pt intubated 11/4-11/6.   Clinical Impression   Pt PTA: living with spouse in home. Pt was recently home from SNF. Pt using rollator for mobility a very short distance. Pt currently minA+2 for bed mobility with heavy rail use; pt stepping towards HOB with minA +2 for hand held assist. Pt tolerating sitting EOB x7 mins total today. Pt appears SOB and very fatigued after minimal exertion. Pt on 4L O2 during session. At rest, SpO2 99% and HR 105. During mobility/stance, SpO2 89% and HR sustained 140s. Cues needed for relaxation.  Pt would benefit from continued OT skilled services for ADL, mobility and safety. OT following acutely.     Follow Up Recommendations  Home health OT;Supervision - Intermittent    Equipment Recommendations  None recommended by OT    Recommendations for Other Services       Precautions / Restrictions Precautions Precautions: Fall;Other (comment) Precaution Comments: R eye blind, Monitor O2 sats and HR Restrictions Weight Bearing Restrictions: No      Mobility Bed Mobility Overal bed mobility: Needs Assistance Bed Mobility: Supine to Sit;Sit to Supine Rolling: Min assist   Supine to sit: +2 for safety/equipment;Min assist;HOB elevated Sit to supine: Min assist;+2 for safety/equipment;HOB elevated   General bed mobility comments: heavy use of rails  Transfers Overall transfer level: Needs assistance Equipment used: 2 person hand held assist Transfers: Sit to/from Stand Sit to Stand: Min assist;+2 physical assistance;+2  safety/equipment         General transfer comment: bilat assist to power up and stabilize balance, sit to stand x 2 trials    Balance Overall balance assessment: Needs assistance Sitting-balance support: Feet supported;Single extremity supported Sitting balance-Leahy Scale: Fair Sitting balance - Comments: supervision for sitting balance   Standing balance support: Bilateral upper extremity supported Standing balance-Leahy Scale: Poor Standing balance comment: reliant on external support to maintain standing balance. Static stand x 3 minutes +2 HH/min assist                           ADL either performed or assessed with clinical judgement   ADL Overall ADL's : Needs assistance/impaired Eating/Feeding: Independent;Sitting   Grooming: Set up;Minimal assistance;Sitting;Wash/dry hands;Wash/dry face;Brushing hair Grooming Details (indicate cue type and reason): multimodal cues for task completion Upper Body Bathing: Minimal assistance;Sitting   Lower Body Bathing: Total assistance;Bed level   Upper Body Dressing : Minimal assistance;Sitting   Lower Body Dressing: Total assistance   Toilet Transfer: Minimal assistance   Toileting- Clothing Manipulation and Hygiene: Total assistance;+2 for safety/equipment;Sit to/from stand       Functional mobility during ADLs: Minimal assistance;+2 for physical assistance;+2 for safety/equipment General ADL Comments: Pt with decreased safety awareness, decreased mobility and decreased activity tolerance.     Vision Baseline Vision/History: No visual deficits Patient Visual Report: No change from baseline;Other (comment)(R eye blind) Vision Assessment?: No apparent visual deficits     Perception     Praxis      Pertinent Vitals/Pain Pain Assessment: No/denies pain Faces Pain Scale: No hurt  Hand Dominance Right   Extremity/Trunk Assessment Upper Extremity Assessment Upper Extremity Assessment: Generalized  weakness   Lower Extremity Assessment Lower Extremity Assessment: Generalized weakness;LLE deficits/detail;RLE deficits/detail RLE Deficits / Details: movement limited by body habitus LLE Deficits / Details: movement limited by body habitus    Cervical / Trunk Assessment Cervical / Trunk Assessment: Other exceptions Cervical / Trunk Exceptions: morbid obesity   Communication Communication Communication: HOH;Expressive difficulties   Cognition Arousal/Alertness: Awake/alert Behavior During Therapy: WFL for tasks assessed/performed Overall Cognitive Status: History of cognitive impairments - at baseline Area of Impairment: Safety/judgement;Awareness;Problem solving;Attention                     Memory: Decreased short-term memory Following Commands: Follows one step commands consistently Safety/Judgement: Decreased awareness of safety;Decreased awareness of deficits Awareness: Intellectual Problem Solving: Slow processing;Requires verbal cues General Comments: Poor insight and awareness into current status. Pt is very particular.   General Comments  Pt on 4L O2 during session. At rest, SpO2 99% and HR 105. During mobility/stance, SpO2 89% and HR sustained 140s. Cues needed for relaxation.    Exercises     Shoulder Instructions      Home Living Family/patient expects to be discharged to:: Private residence Living Arrangements: Spouse/significant other Available Help at Discharge: Family;Available PRN/intermittently Type of Home: Mobile home Home Access: Ramped entrance     Home Layout: One level     Bathroom Shower/Tub: Teacher, early years/pre: Standard Bathroom Accessibility: Yes   Home Equipment: Walker - 4 wheels;Bedside commode;Wheelchair - manual   Additional Comments: sponge bathes, sleeps in recliner      Prior Functioning/Environment Level of Independence: Needs assistance  Gait / Transfers Assistance Needed: Ambulates with  rollator ADL's / Homemaking Assistance Needed: assisted for sponge bathing and uses toilet aide for pericare, can dress independently   Comments: on 3L of O2 all the time        OT Problem List: Decreased activity tolerance;Impaired balance (sitting and/or standing);Decreased safety awareness;Pain;Increased edema      OT Treatment/Interventions: Self-care/ADL training;DME and/or AE instruction;Cognitive remediation/compensation;Patient/family education;Balance training;Therapeutic activities    OT Goals(Current goals can be found in the care plan section) Acute Rehab OT Goals Patient Stated Goal: home OT Goal Formulation: With patient Time For Goal Achievement: 07/12/19 Potential to Achieve Goals: Fair ADL Goals Pt Will Perform Grooming: with set-up;sitting Pt Will Transfer to Toilet: with min guard assist;stand pivot transfer;bedside commode Pt/caregiver will Perform Home Exercise Program: Increased strength;Both right and left upper extremity Additional ADL Goal #1: Pt will recall 3 way of conserving energy during ADL with 1-2 verbal cues.  OT Frequency: Min 2X/week   Barriers to D/C:            Co-evaluation PT/OT/SLP Co-Evaluation/Treatment: Yes Reason for Co-Treatment: Complexity of the patient's impairments (multi-system involvement);For patient/therapist safety PT goals addressed during session: Mobility/safety with mobility;Balance OT goals addressed during session: ADL's and self-care      AM-PAC OT "6 Clicks" Daily Activity     Outcome Measure Help from another person eating meals?: None Help from another person taking care of personal grooming?: A Little Help from another person toileting, which includes using toliet, bedpan, or urinal?: Total Help from another person bathing (including washing, rinsing, drying)?: A Lot Help from another person to put on and taking off regular upper body clothing?: A Lot Help from another person to put on and taking off regular  lower body clothing?: Total 6 Click Score: 13  End of Session Equipment Utilized During Treatment: Rolling walker;Oxygen Nurse Communication: Mobility status  Activity Tolerance: Patient tolerated treatment well Patient left: in bed;with call bell/phone within reach;with bed alarm set  OT Visit Diagnosis: Unsteadiness on feet (R26.81);Other abnormalities of gait and mobility (R26.89);Other symptoms and signs involving cognitive function                Time: 9326-7124 OT Time Calculation (min): 31 min Charges:  OT General Charges $OT Visit: 1 Visit OT Evaluation $OT Eval Moderate Complexity: 1 Mod  Darryl Nestle) Marsa Aris OTR/L Acute Rehabilitation Services Pager: [phone] Office: [phone]   Audie Pinto 06/28/2019, 12:58 PM

## 2019-06-28 NOTE — Progress Notes (Signed)
PCCM:  Follow-up discussion with patient at bedside today.  There was no change of patient's CODE STATUS per documentation yesterday after the discussion with palliative care.  I confirmed this morning with the patient that he would NOT want to be placed on mechanical life support.  Code status was changed to DNR.   I called and confirmed all of this with Dr. Hilma Favors from [facility].   We will transfer patient from the ICU.   Garner Nash, DO [facility] 06/28/2019 8:14 AM

## 2019-06-28 NOTE — Progress Notes (Signed)
Patient's settings on CPAP increased due to patient desating.

## 2019-06-28 NOTE — Progress Notes (Addendum)
NAME:  [name]., MRN:  [id number], DOB:  Apr 07, 1961, LOS: 3 ADMISSION DATE:  06/25/2019, CONSULTATION DATE:  06/25/19 REFERRING MD:  Dr Wilson Singer, CHIEF COMPLAINT:  sob  Brief History   58 yo with h/o COPD/CHF presented with SOB x3 days req. 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 CCM called to admit pt.   Pt was seen intubated and sedated. Per ED physician but was sob and altered, NIV was intitiated but pt cont to worsen and was more lethargic. He was intubated. After intubation pt became hypotensive req neo stick infusion. He was diffusely wheezing given bolus steroid in ed as neb started.   Past Medical History  Brain tumor CAD with prior MI HFpEF Type 2 diabetes  Fibromyalgia Gout Hypertension Obesity COPD  OSA noncompliant CPAP  Significant Hospital Events   11/4 Admitted and intubated  Consults:  PCCM  Procedures:  ETT 11/4 >>   Significant Diagnostic Tests:  CXR 11/4 >> Extensive bilateral infiltrates, with diffuse progression on right and progression at base since prior study, chronic cardiomegaly    Micro Data:  COVID 11/4 >> neg Urine culture 11/4 >> Klebsiella Pneumoniae, susceptibilities pending  Blood culture 11/4 >> RVP >> neg Sputum culture 11/4 >>   Antimicrobials:  Vanc 11/4 >> Cefepime 11/4 >>  Interim history/subjective:  11/4: pt was intubated and sedated today after presentation.  11/5: RN reports no acute events overnight, he is lying in bed on vent in in no acute distress. Diuresed with with IV lasix yesterday 1.7L removed.  11/6 No acute events overnight, remains alert and interactive on vent. Good response to diuretics with 4.7L urine removed in the last  24hrs.   11/7: Doing well this morning.  Asking for eyedrops and food.  Would like ice as well.  Objective   Blood pressure 115/72, pulse 83, temperature 98.2 F (36.8 C), temperature source Oral, resp. rate 17, height 5\' 11"  (1.803 m), weight 107.5 kg, SpO2 99 %.    Vent Mode: PSV;CPAP PEEP:  [5 cmH20] 5 cmH20 Pressure Support:  [5 cmH20] 5 cmH20   Intake/Output Summary (Last 24 hours) at 06/28/2019 0819 Last data filed at 06/28/2019 0600 Gross per 24 hour  Intake 462.6 ml  Output 3351 ml  Net -2888.4 ml   Filed Weights   06/25/19 0819 06/28/19 0340  Weight: 105 kg 107.5 kg    Examination: General: Chronically ill appearing elderly male on mechanical ventilation, in NAD HEENT: ETT, MM pink/moist, PERRL,  Neuro: Alert and interactive on vent, able to follow simple commands, no focal deficits  CV: s1s2 regular rate and rhythm, no murmur, rubs, or gallops,  PULM:  No added breath sounds, no increased work of breathing, tolerating vent well  GI: soft, bowel sounds active in all 4 quadrants, non-tender, non-distended, tolerating TF Extremities: warm/dry, 2+ edema  Skin: no rashes or lesions  Resolved Hospital Problem list   Hypotension  Hyperkalemia   Assessment & Plan:   Acute on chronic hypoxic and hypercarbic resp failure secondary to  Acute exacerbation of COPD  Acute pulmonary edema -2/2 copd exacerbation -Baseline oxygen equipment of 2-3L [place] P:  Patient extubated yesterday. Continue scheduled nebs, added Brovana plus Pulmicort Continue diuresis,  added additional doses of Lasix today.  OSA noncompliant with CPAP   P: Continue CPAP as needed with naps and nightly  Acute decompensated HFpEF with pulmonary edema - Limited ECHO 11/4 unable to comment on valve structure or LVF function even with contrast used -ECHO 02/22/2019 preserved EF of 55-60% P: Continue diuresis Follow I's and O's Restart heart failure regimen Coreg 12.5 mg twice daily  Paroxysmal Atrial  fibrillation   -History of DC-CV 04/2018 -rate controlled at this time on chronic Amiodarone -on chronic anticoagulation with Eliquis   P: Continue home dose anticoagulation with Eliquis Continue home dose amiodarone  CKD3 -cr baseline ~2.3 P: Follow renal function Follow urine output  Chronic macrocytic anemia P: Conservative transfusion threshold Continue to observe  Type 2 diabetes with hyperglycemia:  P: Resume home Lantus CBGs with SSI  At risk malnutrition P: Advance diet as tolerated  Medical noncompliance Longstanding history of medical noncompliance with use of any medications at home.  Goals of care: Long discussion again this morning with the patient as he has several questions.  He once again confirms that he does not want to end up on mechanical life support or be harmed in any way.  Therefore CODE STATUS was changed to DNR to reflect the patient's wishes.  This is also discussed with Dr. Rhea Pink again via phone.  Please see her documentation from yesterday.  Patient will be transferred from the intensive care unit to medical surgical floor.  Waiting callback from hospitalist service.   Best practice:  Diet: Tube feeds  Pain/Anxiety/Delirium protocol (if indicated): Fentanyl  VAP protocol (if indicated): in place  DVT prophylaxis: heparin GI prophylaxis: famotidine Glucose control: ssi Mobility: bed rest Code Status: DNR  Disposition: ICU  Labs   CBC: Recent Labs  Lab 06/25/19 0845 06/25/19 1316 06/26/19 0514 06/27/19 0439 06/27/19 0501 06/28/19 0258  WBC 9.7  --  10.9* 11.5*  --  11.3*  NEUTROABS 6.4  --   --   --   --   --   HGB 11.8* 12.9* 11.0* 10.6* 11.6* 11.0*  HCT 43.2 38.0* 38.3* 36.4* 34.0* 38.0*  MCV 104.6*  --  99.2 96.6  --  97.9  PLT 201  --  215 195  --  542    Basic Metabolic Panel: Recent Labs  Lab 06/25/19 0845 06/25/19 1059 06/25/19 1316 06/26/19 0514 06/27/19 0439 06/27/19 0501 06/28/19 0258  NA 144 140 142  144 146* 145 149*  K 5.2* 5.9* 5.3* 4.3 3.6 3.5 3.4*  CL 103 106  --  105 103  --  101  CO2 31 25  --  28 29  --  33*  GLUCOSE 182* 212*  --  195* 292*  --  213*  BUN 42* 45*  --  50* 71*  --  83*  CREATININE 2.28* 2.44*  --  2.65* 2.89*  --  2.81*  CALCIUM 9.3 8.7*  --  9.6 9.6  --  8.8*  MG  --   --   --  2.4 2.4  --  2.6*  PHOS  --   --   --   --  2.5  --  4.4   GFR: Estimated Creatinine Clearance: 35.7 mL/min (A) (by C-G formula based on SCr of 2.81 mg/dL (H)). Recent Labs  Lab 06/25/19 0845 06/25/19 1059 06/25/19 1216 06/25/19 1534 06/25/19 1941 06/26/19 0514 06/26/19 0821 06/27/19 0439 06/28/19 0258  PROCALCITON  --  0.10  --   --   --   --   --   --   --  WBC 9.7  --   --   --   --  10.9*  --  11.5* 11.3*  LATICACIDVEN  --   --  1.3 2.2* 3.1*  --  1.8  --   --     Liver Function Tests: No results for input(s): AST, ALT, ALKPHOS, BILITOT, PROT, ALBUMIN in the last 168 hours. No results for input(s): LIPASE, AMYLASE in the last 168 hours. No results for input(s): AMMONIA in the last 168 hours.  ABG    Component Value Date/Time   PHART 7.462 (H) 06/27/2019 0501   PCO2ART 46.5 06/27/2019 0501   PO2ART 132.0 (H) 06/27/2019 0501   HCO3 32.8 (H) 06/27/2019 0501   TCO2 34 (H) 06/27/2019 0501   ACIDBASEDEF 1.0 02/16/2016 2031   O2SAT 99.0 06/27/2019 0501     Coagulation Profile: No results for input(s): INR, PROTIME in the last 168 hours.  Cardiac Enzymes: No results for input(s): CKTOTAL, CKMB, CKMBINDEX, TROPONINI in the last 168 hours.  HbA1C: Hgb A1c MFr Bld  Date/Time Value Ref Range Status  04/14/2019 04:37 AM 7.8 (H) 4.8 - 5.6 % Final    Comment:    (NOTE) Pre diabetes:          5.7%-6.4% Diabetes:              >6.4% Glycemic control for   <7.0% adults with diabetes   03/19/2019 10:32 AM 8.8 (H) 4.6 - 6.5 % Final    Comment:    Glycemic Control Guidelines for People with Diabetes:Non Diabetic:  <6%Goal of Therapy: <7%Additional Action Suggested:   >8%     CBG: Recent Labs  Lab 06/27/19 1510 06/27/19 1955 06/27/19 2318 06/28/19 0314 06/28/19 0729  GLUCAP 197* 223* 213* 195* 178*    Garner Nash, DO [facility] 06/28/2019 8:19 AM     I spoke with Karmen Bongo from [facility] who will add patient to [facility] pick up list for tomorrow.

## 2019-06-28 NOTE — Progress Notes (Signed)
Palliative Care Progress Note  Reason for consult: Goals of care in light or recurrent hypercapnic respiratory failure.  Chart reviewed including personal review of pertinent labs and imaging.  Briefly, [name] is a 58 year old male with PMHx COPD, chronic hypoxic/hypercapnic resp failure, OSA, obesity, CKD, HFpEF with recurrent admissions and intubation for hypoxic/hypercapnic respiratory failure.  He is well know to our team from prior admissions.  He has been DNR/DNI in the past, but limited health literacy and poor communication have resulted in him continuing to return to the ED and ending up intubated.  His last admission, consideration was for transition to residential hospice, however, his situation stabilized.  I met today with [name] and his wife, [name].  We discussed clinical course this admission as well as wishes moving forward in regard to his care moving forward and advanced directives.  Concepts specific to code status and care plan this hospitalization discussed.  Values and goals of care important to patient and family were attempted to be elicited.  - DNR/DNI- [name] is in clear agreement with plan for no reintubation in the event of decompensation. - His primary concern today is on pain management.  He is clear and specific in his desire to receive his his oxycodone at 0000, 0600, 1200, and 1800.  He reports that he understands that opioids can effect respirations, but his main focus is on being pain free and without SOB.  Made addition of hydrocodone for relief of pain and SOB. - Plan is to continue current interventions, not escalate to ICU level care, and reassess his situation over the next 48 hours.  As discussed multiple times in the past, he may be best served by transition to residential hospice if his condition deteriorates.  Questions and concerns addressed.   PMT will continue to support holistically.  Time in: 1410 Time out: 1530 Total time: 80  minutes  Greater than 50%  of this time was spent counseling and coordinating care related to the above assessment and plan. Micheline Rough, MD [facility] [phone]

## 2019-06-28 NOTE — Evaluation (Signed)
Physical Therapy Evaluation Patient Details Name: [name]. 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 Pt intubated 11/4-11/6.    Clinical Impression  Pt admitted with above diagnosis. On eval, pt required min assist bed mobility, +2 min assist sit to stand and +2 min assist sidestepping bedside. Pt on 4L O2 during session. Desat to 89% and tachy (sustained 140s) during stance. Pt perseverating on need to urinate (with foley catheter in place). Pt declined transfer to recliner due to need for bariatric recliner. Pt with regular recliner in room and bariatric recliner not readily available. At baseline, pt lives at home with his wife. He ambulates with rollator and sleeps in a recliner. Pt currently with functional limitations due to the deficits listed below (see PT Problem List). Pt will benefit from skilled PT to increase their independence and safety with mobility to allow discharge to the venue listed below.       Follow Up Recommendations Home health PT;Supervision for mobility/OOB    Equipment Recommendations  None recommended by PT    Recommendations for Other Services       Precautions / Restrictions Precautions Precautions: Fall;Other (comment) Precaution Comments: R eye blind, Monitor O2 sats and HR Restrictions Weight Bearing Restrictions: No      Mobility  Bed Mobility Overal bed mobility: Needs Assistance Bed Mobility: Supine to Sit;Sit to Supine Rolling: Min assist   Supine to sit: +2 for safety/equipment;Min assist;HOB elevated Sit to supine: Min assist;+2 for safety/equipment;HOB elevated   General bed mobility comments: heavy use of  rails  Transfers Overall transfer level: Needs assistance Equipment used: 2 person hand held assist Transfers: Sit to/from Stand Sit to Stand: +2 physical assistance;Min assist         General transfer comment: bilat assist to power up and stabilize balance, sit to stand x 2 trials  Ambulation/Gait Ambulation/Gait assistance: +2 physical assistance;Min assist Gait Distance (Feet): 3 Feet Assistive device: 2 person hand held assist Gait Pattern/deviations: Wide base of support;Shuffle     General Gait Details: sidestepping bedside with +2 HHA for better positioning with return to bed  Stairs            Wheelchair Mobility    Modified Rankin (Stroke Patients Only)       Balance Overall balance assessment: Needs assistance Sitting-balance support: Feet supported;Single extremity supported Sitting balance-Leahy Scale: Fair Sitting balance - Comments: supervision for sitting balance   Standing balance support: Bilateral upper extremity supported Standing balance-Leahy Scale: Poor Standing balance comment: reliant on external support to maintain standing balance. Static stand x 3 minutes +2 HH/min assist                             Pertinent Vitals/Pain Pain Assessment: No/denies pain    Home Living Family/patient expects to be discharged to:: Private residence Living Arrangements: Spouse/significant other Available Help at Discharge: Family;Available PRN/intermittently Type of Home: Mobile home Home Access: Ramped entrance     Home Layout: One level Home Equipment: [place] - 4 wheels;Bedside commode;Wheelchair - manual Additional Comments: sponge bathes, sleeps in recliner    Prior Function Level of Independence: Needs assistance  Gait / Transfers Assistance Needed: Ambulates with rollator  ADL's / Homemaking Assistance Needed: assisted for sponge bathing and uses toilet aide for pericare, can dress independently  Comments: on 3L of O2 all  the time     Hand Dominance   Dominant Hand: Right    Extremity/Trunk Assessment   Upper Extremity Assessment Upper Extremity Assessment: Defer to OT evaluation    Lower Extremity Assessment Lower Extremity Assessment: Generalized weakness;LLE deficits/detail;RLE deficits/detail RLE Deficits / Details: movement limited by body habitus LLE Deficits / Details: movement limited by body habitus     Cervical / Trunk Assessment Cervical / Trunk Assessment: Other exceptions Cervical / Trunk Exceptions: morbid obesity  Communication   Communication: HOH;Expressive difficulties  Cognition Arousal/Alertness: Awake/alert Behavior During Therapy: WFL for tasks assessed/performed Overall Cognitive Status: History of cognitive impairments - at baseline                                        General Comments General comments (skin integrity, edema, etc.): Pt on 4L O2 during session. At rest, SpO2 99% and HR 105. During mobility/stance, SpO2 89% and HR sustained 140s. Cues needed for relaxation.    Exercises     Assessment/Plan    PT Assessment Patient needs continued PT services  PT Problem List Decreased strength;Decreased activity tolerance;Decreased balance;Decreased mobility;Decreased cognition;Decreased safety awareness       PT Treatment Interventions DME instruction;Gait training;Functional mobility training;Therapeutic activities;Therapeutic exercise;Balance training;Patient/family education    PT Goals (Current goals can be found in the Care Plan section)  Acute Rehab PT Goals Patient Stated Goal: home PT Goal Formulation: With patient Time For Goal Achievement: 07/12/19 Potential to Achieve Goals: Fair    Frequency Min 3X/week   Barriers to discharge Decreased caregiver support      Co-evaluation PT/OT/SLP Co-Evaluation/Treatment: Yes Reason for Co-Treatment: For patient/therapist safety;Complexity of the patient's impairments (multi-system  involvement);To address functional/ADL transfers PT goals addressed during session: Mobility/safety with mobility;Balance         AM-PAC PT "6 Clicks" Mobility  Outcome Measure Help needed turning from your back to your side while in a flat bed without using bedrails?: A Little Help needed moving from lying on your back to sitting on the side of a flat bed without using bedrails?: A Little Help needed moving to and from a bed to a chair (including a wheelchair)?: A Lot Help needed standing up from a chair using your arms (e.g., wheelchair or bedside chair)?: A Little Help needed to walk in hospital room?: A Lot Help needed climbing 3-5 steps with a railing? : A Lot 6 Click Score: 15    End of Session Equipment Utilized During Treatment: Oxygen Activity Tolerance: Patient tolerated treatment well Patient left: in bed;with call bell/phone within reach;with bed alarm set Nurse Communication: Mobility status PT Visit Diagnosis: Muscle weakness (generalized) (M62.81);Difficulty in walking, not elsewhere classified (R26.2);Unsteadiness on feet (R26.81)    Time: 0867-6195 PT Time Calculation (min) (ACUTE ONLY): 31 min   Charges:   PT Evaluation $PT Eval Moderate Complexity: 1 Mod          Lorrin Goodell, PT  Office # [phone] Pager [phone]   Lorriane Shire 06/28/2019, 10:29 AM

## 2019-06-29 DIAGNOSIS — N184 Chronic kidney disease, stage 4 (severe): Secondary | ICD-10-CM

## 2019-06-29 DIAGNOSIS — J441 Chronic obstructive pulmonary disease with (acute) exacerbation: Secondary | ICD-10-CM

## 2019-06-29 DIAGNOSIS — I5033 Acute on chronic diastolic (congestive) heart failure: Secondary | ICD-10-CM

## 2019-06-29 LAB — GLUCOSE, CAPILLARY
Glucose-Capillary: 208 mg/dL — ABNORMAL HIGH (ref 70–99)
Glucose-Capillary: 252 mg/dL — ABNORMAL HIGH (ref 70–99)
Glucose-Capillary: 146 mg/dL — ABNORMAL HIGH (ref 70–99)
Glucose-Capillary: 171 mg/dL — ABNORMAL HIGH (ref 70–99)

## 2019-06-29 LAB — CULTURE, RESPIRATORY W GRAM STAIN: Culture: NORMAL

## 2019-06-29 MED ORDER — INSULIN ASPART 100 UNIT/ML ~~LOC~~ SOLN
0.0000 [IU] | Freq: Three times a day (TID) | SUBCUTANEOUS | Status: DC
  Administered 2019-06-29: 3 [IU] via SUBCUTANEOUS
  Administered 2019-06-30: 8 [IU] via SUBCUTANEOUS
  Administered 2019-06-30: 15 [IU] via SUBCUTANEOUS

## 2019-06-29 MED ORDER — DM-GUAIFENESIN ER 30-600 MG PO TB12
1.0000 | ORAL_TABLET | Freq: Two times a day (BID) | ORAL | Status: DC
  Administered 2019-06-29 – 2019-07-04 (×11): 1 via ORAL
  Filled 2019-06-29 (×12): qty 1

## 2019-06-29 MED ORDER — ISOSORBIDE MONONITRATE ER 30 MG PO TB24
30.0000 mg | ORAL_TABLET | Freq: Every day | ORAL | Status: DC
  Administered 2019-06-29 – 2019-07-03 (×5): 30 mg via ORAL
  Filled 2019-06-29 (×5): qty 1

## 2019-06-29 MED ORDER — TORSEMIDE 20 MG PO TABS
80.0000 mg | ORAL_TABLET | Freq: Two times a day (BID) | ORAL | Status: DC
  Administered 2019-06-29 – 2019-07-02 (×7): 80 mg via ORAL
  Filled 2019-06-29 (×9): qty 4

## 2019-06-29 NOTE — Progress Notes (Signed)
Patient ID: [name]., male   DOB: [birth year], 58 y.o.   MRN: [id number]  PROGRESS NOTE    [name].  HEN:[id number] DOB: March 14, 1961 DOA: 06/25/2019 PCP: Caren Macadam, MD   Brief Narrative:  58 year old male with history of COPD, chronic hypoxic/hypercapnic respiratory failure on 2 to 3 L nasal cannula at home but had been switched to trilogy vent at home after recent hospitalization and discharge home, non adherent to CPAP, OSA, chronic renal disease stage III-IV, chronic diastolic heart failure, multiple recent hospitalizations with worsening respiratory failure requiring intubation presented with worsening shortness of breath on 06/25/2019.  He was found to be hypoxic/hypercapnic and failed NIV therapy and required intubation.  He was admitted under CCM service and also required vasopressors.  COVID-19 testing was negative.  Initially started on broad-spectrum antibiotics.  He was also treated with intravenous Lasix and diuresed well.  He was extubated on 06/27/2019 and transferred to [facility] service on 06/29/2019.  Palliative care was also consulted.  Assessment & Plan:  Acute on chronic hypoxic and hypercarbic respiratory failure COPD exacerbation -Intubated on admission and extubated on 06/27/2019.  Transferred to [facility] service on 06/29/2019. -Continue nebs and diuresis.  Continue IV Solu-Medrol today.  Switch to oral prednisone tomorrow. -Still requiring 5 L oxygen via nasal cannula -COVID-19 testing negative.  Patient received 3 days of cefepime and Zithromax initially and 1 dose of vancomycin on admission.  Urine culture grew ESBL Klebsiella, possibly colonizer.  Acute on chronic diastolic heart failure -Limited echo during this hospitalization was poor study.  Limited echo on 02/22/2019 showed EF of 55 to 60% -Strict input and output.  Daily weights.  Fluid restriction.  Negative balance of 11,838.6 cc since admission. -Continue Coreg.  Was treated with IV Lasix  during the hospitalization.  We will switch to torsemide 80 mg twice a day.  Resume isosorbide mononitrate 30 mg daily.  Chronic kidney disease stage IV -Recent creatinine baseline around 2.3-2.7 -Monitor.  Diuretics plan as above.  Not a candidate for dialysis.  Anemia of chronic disease -From chronic any disease.  Hemoglobin stable  Hypokalemia -Replace.  Repeat a.m. labs  OSA/OHS, noncompliance to CPAP  Chronic A. Fib -Continue Eliquis and amiodarone.  Rate controlled  Diabetes mellitus type 2 uncontrolled with hyperglycemia.  Continue Lantus and CBGs with SSI.  Generalized deconditioning Noncompliance -Overall prognosis is very poor.  Patient is noncompliant to medications as well as CPAP.  Patient has had recurrent recent hospitalization and multiple intubations.  This time, he apparently has decided that he would not want any more intubations. -His overall insight is very poor. -Recommend residential hospice. -Palliative care following.     DVT prophylaxis: Heparin Code Status: DNR Family Communication: Spoke to patient at bedside Disposition Plan: Patient will benefit from hospice Consultants: PCCM/palliative care  Procedures: Intubation/extubation Limited echo: Very poor quality  Antimicrobials:  Anti-infectives (From admission, onward)   Start     Dose/Rate Route Frequency Ordered Stop   06/26/19 1430  vancomycin (VANCOCIN) IVPB 1000 mg/200 mL premix  Status:  Discontinued     1,000 mg 200 mL/hr over 60 Minutes Intravenous Every 24 hours 06/25/19 1331 06/26/19 1054   06/26/19 1000  azithromycin (ZITHROMAX) 250 mg in dextrose 5 % 125 mL IVPB  Status:  Discontinued     250 mg 125 mL/hr over 60 Minutes Intravenous Every 24 hours 06/25/19 1308 06/28/19 0816   06/26/19 0000  ceFEPIme (MAXIPIME) 2 g in sodium chloride 0.9 % 100  mL IVPB  Status:  Discontinued     2 g 200 mL/hr over 30 Minutes Intravenous Every 12 hours 06/25/19 1308 06/28/19 0816   06/25/19 1430   vancomycin (VANCOCIN) 2,250 mg in sodium chloride 0.9 % 500 mL IVPB  Status:  Discontinued     2,250 mg 250 mL/hr over 120 Minutes Intravenous  Once 06/25/19 1321 06/25/19 1350   06/25/19 1430  vancomycin (VANCOCIN) 2,500 mg in sodium chloride 0.9 % 500 mL IVPB     2,500 mg 250 mL/hr over 120 Minutes Intravenous  Once 06/25/19 1350 06/25/19 2043   06/25/19 1000  ceFEPIme (MAXIPIME) 2 g in sodium chloride 0.9 % 100 mL IVPB     2 g 200 mL/hr over 30 Minutes Intravenous  Once 06/25/19 0959 06/25/19 1211   06/25/19 1000  azithromycin (ZITHROMAX) 500 mg in sodium chloride 0.9 % 250 mL IVPB     500 mg 250 mL/hr over 60 Minutes Intravenous  Once 06/25/19 0959 06/25/19 1130       Subjective: Patient seen and examined at bedside.  He is a very poor historian.  Denies worsening shortness of breath, chest pain, fever.  Objective: Vitals:   06/28/19 2058 06/28/19 2118 06/29/19 0031 06/29/19 0635  BP:  133/79  120/78  Pulse: 80 79 78 76  Resp: 18 19 20 17   Temp:  97.6 F (36.4 C)  97.7 F (36.5 C)  TempSrc:  Oral  Oral  SpO2: 99% 99% 99% 93%  Weight:      Height:        Intake/Output Summary (Last 24 hours) at 06/29/2019 1105 Last data filed at 06/29/2019 0835 Gross per 24 hour  Intake 660 ml  Output 6000 ml  Net -5340 ml   Filed Weights   06/25/19 0819 06/28/19 0340  Weight: 105 kg 107.5 kg    Examination:  General exam: Chronically ill looking.  Poor historian.  No acute distress Respiratory system: Bilateral decreased breath sounds at bases with scattered crackles Cardiovascular system: S1 & S2 heard, Rate controlled Gastrointestinal system: Abdomen is nondistended, soft and nontender. Normal bowel sounds heard. Extremities: No cyanosis, clubbing; lower extremity edema present Central nervous system: Alert and oriented.  Very poor historian.  No focal neurological deficits. Moving extremities Skin: No rashes, lesions or ulcers Psychiatry: Looks well very poor insight.  Flat  affect.   Data Reviewed: I have personally reviewed following labs and imaging studies  CBC: Recent Labs  Lab 06/25/19 0845 06/25/19 1316 06/26/19 0514 06/27/19 0439 06/27/19 0501 06/28/19 0258  WBC 9.7  --  10.9* 11.5*  --  11.3*  NEUTROABS 6.4  --   --   --   --   --   HGB 11.8* 12.9* 11.0* 10.6* 11.6* 11.0*  HCT 43.2 38.0* 38.3* 36.4* 34.0* 38.0*  MCV 104.6*  --  99.2 96.6  --  97.9  PLT 201  --  215 195  --  756   Basic Metabolic Panel: Recent Labs  Lab 06/25/19 0845 06/25/19 1059 06/25/19 1316 06/26/19 0514 06/27/19 0439 06/27/19 0501 06/28/19 0258  NA 144 140 142 144 146* 145 149*  K 5.2* 5.9* 5.3* 4.3 3.6 3.5 3.4*  CL 103 106  --  105 103  --  101  CO2 31 25  --  28 29  --  33*  GLUCOSE 182* 212*  --  195* 292*  --  213*  BUN 42* 45*  --  50* 71*  --  83*  CREATININE  2.28* 2.44*  --  2.65* 2.89*  --  2.81*  CALCIUM 9.3 8.7*  --  9.6 9.6  --  8.8*  MG  --   --   --  2.4 2.4  --  2.6*  PHOS  --   --   --   --  2.5  --  4.4   GFR: Estimated Creatinine Clearance: 35.7 mL/min (A) (by C-G formula based on SCr of 2.81 mg/dL (H)). Liver Function Tests: No results for input(s): AST, ALT, ALKPHOS, BILITOT, PROT, ALBUMIN in the last 168 hours. No results for input(s): LIPASE, AMYLASE in the last 168 hours. No results for input(s): AMMONIA in the last 168 hours. Coagulation Profile: No results for input(s): INR, PROTIME in the last 168 hours. Cardiac Enzymes: No results for input(s): CKTOTAL, CKMB, CKMBINDEX, TROPONINI in the last 168 hours. BNP (last 3 results) No results for input(s): PROBNP in the last 8760 hours. HbA1C: No results for input(s): HGBA1C in the last 72 hours. CBG: Recent Labs  Lab 06/28/19 0729 06/28/19 1143 06/28/19 1637 06/28/19 2122 06/29/19 0749  GLUCAP 178* 199* 181* 231* 208*   Lipid Profile: No results for input(s): CHOL, HDL, LDLCALC, TRIG, CHOLHDL, LDLDIRECT in the last 72 hours. Thyroid Function Tests: No results for  input(s): TSH, T4TOTAL, FREET4, T3FREE, THYROIDAB in the last 72 hours. Anemia Panel: No results for input(s): VITAMINB12, FOLATE, FERRITIN, TIBC, IRON, RETICCTPCT in the last 72 hours. Sepsis Labs: Recent Labs  Lab 06/25/19 1059 06/25/19 1216 06/25/19 1534 06/25/19 1941 06/26/19 0821  PROCALCITON 0.10  --   --   --   --   LATICACIDVEN  --  1.3 2.2* 3.1* 1.8    Recent Results (from the past 240 hour(s))  SARS Coronavirus 2 by RT PCR (hospital order, performed in [place] hospital lab) Nasopharyngeal Nasopharyngeal Swab     Status: None   Collection Time: 06/25/19  8:45 AM   Specimen: Nasopharyngeal Swab  Result Value Ref Range Status   SARS Coronavirus 2 NEGATIVE NEGATIVE Final    Comment: (NOTE) If result is NEGATIVE SARS-CoV-2 target nucleic acids are NOT DETECTED. The SARS-CoV-2 RNA is generally detectable in upper and lower  respiratory specimens during the acute phase of infection. The lowest  concentration of SARS-CoV-2 viral copies this assay can detect is 250  copies / mL. A negative result does not preclude SARS-CoV-2 infection  and should not be used as the sole basis for treatment or other  patient management decisions.  A negative result may occur with  improper specimen collection / handling, submission of specimen other  than nasopharyngeal swab, presence of viral mutation(s) within the  areas targeted by this assay, and inadequate number of viral copies  (<250 copies / mL). A negative result must be combined with clinical  observations, patient history, and epidemiological information. If result is POSITIVE SARS-CoV-2 target nucleic acids are DETECTED. The SARS-CoV-2 RNA is generally detectable in upper and lower  respiratory specimens dur ing the acute phase of infection.  Positive  results are indicative of active infection with SARS-CoV-2.  Clinical  correlation with patient history and other diagnostic information is  necessary to determine patient  infection status.  Positive results do  not rule out bacterial infection or co-infection with other viruses. If result is PRESUMPTIVE POSTIVE SARS-CoV-2 nucleic acids MAY BE PRESENT.   A presumptive positive result was obtained on the submitted specimen  and confirmed on repeat testing.  While 2019 novel coronavirus  (SARS-CoV-2) nucleic acids  may be present in the submitted sample  additional confirmatory testing may be necessary for epidemiological  and / or clinical management purposes  to differentiate between  SARS-CoV-2 and other Sarbecovirus currently known to infect humans.  If clinically indicated additional testing with an alternate test  methodology [phone]) is advised. The SARS-CoV-2 RNA is generally  detectable in upper and lower respiratory sp ecimens during the acute  phase of infection. The expected result is Negative. Fact Sheet for Patients:  StrictlyIdeas.no Fact Sheet for Healthcare Providers: BankingDealers.co.za This test is not yet approved or cleared by the [place] FDA and has been authorized for detection and/or diagnosis of SARS-CoV-2 by FDA under an Emergency Use Authorization (EUA).  This EUA will remain in effect (meaning this test can be used) for the duration of the COVID-19 declaration under Section 564(b)(1) of the Act, 21 U.S.C. section 360bbb-3(b)(1), unless the authorization is terminated or revoked sooner. Performed at [facility], [place] [address] [place], [place] [address]   Urine culture     Status: Abnormal   Collection Time: 06/25/19 12:16 PM   Specimen: Urine, Clean Catch  Result Value Ref Range Status   Specimen Description URINE, CLEAN CATCH  Final   Special Requests   Final    NONE Performed at [facility], [place] [address], [place], [place] [address]    Culture (A)  Final    >=100,000 COLONIES/mL KLEBSIELLA PNEUMONIAE Confirmed Extended Spectrum Beta-Lactamase  Producer (ESBL).  In bloodstream infections from ESBL organisms, carbapenems are preferred over piperacillin/tazobactam. They are shown to have a lower risk of mortality.    Report Status 06/27/2019 FINAL  Final   Organism ID, Bacteria KLEBSIELLA PNEUMONIAE (A)  Final      Susceptibility   Klebsiella pneumoniae - MIC*    AMPICILLIN >=32 RESISTANT Resistant     CEFAZOLIN >=64 RESISTANT Resistant     CEFTRIAXONE >=64 RESISTANT Resistant     CIPROFLOXACIN 1 SENSITIVE Sensitive     GENTAMICIN <=1 SENSITIVE Sensitive     IMIPENEM <=0.25 SENSITIVE Sensitive     NITROFURANTOIN 64 INTERMEDIATE Intermediate     TRIMETH/SULFA >=320 RESISTANT Resistant     AMPICILLIN/SULBACTAM >=32 RESISTANT Resistant     PIP/TAZO 16 SENSITIVE Sensitive     Extended ESBL POSITIVE Resistant     * >=100,000 COLONIES/mL KLEBSIELLA PNEUMONIAE  Culture, blood (routine x 2)     Status: None (Preliminary result)   Collection Time: 06/25/19 12:32 PM   Specimen: BLOOD LEFT WRIST  Result Value Ref Range Status   Specimen Description BLOOD LEFT WRIST  Final   Special Requests   Final    BOTTLES DRAWN AEROBIC ONLY Blood Culture adequate volume   Culture   Final    NO GROWTH 4 DAYS Performed at [facility], [address] [place], [place] [address]    Report Status PENDING  Incomplete  Culture, blood (routine x 2)     Status: None (Preliminary result)   Collection Time: 06/25/19 12:54 PM   Specimen: BLOOD RIGHT HAND  Result Value Ref Range Status   Specimen Description BLOOD RIGHT HAND  Final   Special Requests   Final    BOTTLES DRAWN AEROBIC AND ANAEROBIC Blood Culture adequate volume   Culture   Final    NO GROWTH 4 DAYS Performed at [facility], [place] [address] [place],  [address]    Report Status PENDING  Incomplete  MRSA PCR Screening  Status: None   Collection Time: 06/25/19  3:00 PM   Specimen: Nasal Mucosa; Nasopharyngeal  Result Value Ref Range Status   MRSA by PCR NEGATIVE  NEGATIVE Final    Comment:        The GeneXpert MRSA Assay (FDA approved for NASAL specimens only), is one component of a comprehensive MRSA colonization surveillance program. It is not intended to diagnose MRSA infection nor to guide or monitor treatment for MRSA infections. Performed at [facility], [place] [address] [place], [place] [address]   Respiratory Panel by PCR     Status: None   Collection Time: 06/25/19  4:40 PM   Specimen: Nasopharyngeal Swab; Respiratory  Result Value Ref Range Status   Adenovirus NOT DETECTED NOT DETECTED Final   Coronavirus 229E NOT DETECTED NOT DETECTED Final    Comment: (NOTE) The Coronavirus on the Respiratory Panel, DOES NOT test for the novel  Coronavirus (2019 nCoV)    Coronavirus HKU1 NOT DETECTED NOT DETECTED Final   Coronavirus NL63 NOT DETECTED NOT DETECTED Final   Coronavirus OC43 NOT DETECTED NOT DETECTED Final   Metapneumovirus NOT DETECTED NOT DETECTED Final   Rhinovirus / Enterovirus NOT DETECTED NOT DETECTED Final   Influenza A NOT DETECTED NOT DETECTED Final   Influenza B NOT DETECTED NOT DETECTED Final   Parainfluenza Virus 1 NOT DETECTED NOT DETECTED Final   Parainfluenza Virus 2 NOT DETECTED NOT DETECTED Final   Parainfluenza Virus 3 NOT DETECTED NOT DETECTED Final   Parainfluenza Virus 4 NOT DETECTED NOT DETECTED Final   Respiratory Syncytial Virus NOT DETECTED NOT DETECTED Final   Bordetella pertussis NOT DETECTED NOT DETECTED Final   Chlamydophila pneumoniae NOT DETECTED NOT DETECTED Final   Mycoplasma pneumoniae NOT DETECTED NOT DETECTED Final    Comment: Performed at [facility], [place]. [address] [place], [place] [address]  Culture, respiratory (non-expectorated)     Status: None (Preliminary result)   Collection Time: 06/25/19  4:45 PM   Specimen: Tracheal Aspirate; Respiratory  Result Value Ref Range Status   Specimen Description TRACHEAL ASPIRATE  Final   Special Requests NONE  Final   Gram Stain    Final    FEW WBC PRESENT,BOTH PMN AND MONONUCLEAR NO ORGANISMS SEEN    Culture   Final    CULTURE REINCUBATED FOR BETTER GROWTH Performed at [facility], [address], [place], [place] [address]    Report Status PENDING  Incomplete         Radiology Studies: Dg Chest Port 1 View  Result Date: 06/28/2019 CLINICAL DATA:  Endotracheal tube. EXAM: PORTABLE CHEST 1 VIEW COMPARISON:  06/27/2019 FINDINGS: Endotracheal tube not visualized. Lungs are adequately inflated with persistent bilateral perihilar and bibasilar opacification which may be due to interstitial edema versus infection. Stable cardiomegaly. No significant effusion or pneumothorax. Remainder the exam is unchanged. IMPRESSION: Persistent mild bilateral perihilar and bibasilar opacification suggesting interstitial edema and less likely infection. Stable cardiomegaly. Electronically Signed   By: Marin Olp M.D.   On: 06/28/2019 09:32        Scheduled Meds:  AeroChamber Plus Flo-Vu Large  1 each Other Once   amiodarone  100 mg Oral Daily   apixaban  5 mg Oral BID   arformoterol  15 mcg Nebulization BID   budesonide (PULMICORT) nebulizer solution  0.25 mg Nebulization BID   carvedilol  12.5 mg Oral BID WC   Chlorhexidine Gluconate Cloth  6 each Topical Daily   feeding supplement (  PRO-STAT SUGAR FREE 64)  30 mL Per Tube 5 X Daily   HYDROcodone-acetaminophen  1 tablet Oral Q6H   insulin aspart  0-15 Units Subcutaneous Q4H   insulin glargine  25 Units Subcutaneous Daily   ipratropium  2 puff Inhalation Once   mouth rinse  15 mL Mouth Rinse BID   methylPREDNISolone (SOLU-MEDROL) injection  40 mg Intravenous Q24H   pantoprazole (PROTONIX) IV  40 mg Intravenous QHS   rosuvastatin  20 mg Oral q1800   topiramate  100 mg Oral QHS   Continuous Infusions:  feeding supplement (VITAL AF 1.2 CAL) Stopped (06/27/19 1030)          Aline August, MD [facility] 06/29/2019, 11:05 AM

## 2019-06-29 NOTE — Plan of Care (Signed)
  Problem: Coping: Goal: Level of anxiety will decrease Outcome: Progressing   Problem: Pain Managment: Goal: General experience of comfort will improve Outcome: Progressing   

## 2019-06-29 NOTE — Progress Notes (Signed)
Pt had nose bleeding minimal , on eliquis, Dr, Starla Link aware.

## 2019-06-30 ENCOUNTER — Ambulatory Visit: Payer: HMO | Admitting: Family Medicine

## 2019-06-30 LAB — GLUCOSE, CAPILLARY
Glucose-Capillary: 252 mg/dL — ABNORMAL HIGH (ref 70–99)
Glucose-Capillary: 352 mg/dL — ABNORMAL HIGH (ref 70–99)
Glucose-Capillary: 306 mg/dL — ABNORMAL HIGH (ref 70–99)
Glucose-Capillary: 221 mg/dL — ABNORMAL HIGH (ref 70–99)

## 2019-06-30 LAB — CBC WITH DIFFERENTIAL/PLATELET
Abs Immature Granulocytes: 0.06 10*3/uL (ref 0.00–0.07)
Basophils Absolute: 0 10*3/uL (ref 0.0–0.1)
Basophils Relative: 0 %
Eosinophils Absolute: 0.2 10*3/uL (ref 0.0–0.5)
Eosinophils Relative: 2 %
HCT: 41 % (ref 39.0–52.0)
Hemoglobin: 12.2 g/dL — ABNORMAL LOW (ref 13.0–17.0)
Immature Granulocytes: 1 %
Lymphocytes Relative: 18 %
Lymphs Abs: 2.1 10*3/uL (ref 0.7–4.0)
MCH: 28.4 pg (ref 26.0–34.0)
MCHC: 29.8 g/dL — ABNORMAL LOW (ref 30.0–36.0)
MCV: 95.6 fL (ref 80.0–100.0)
Monocytes Absolute: 1.8 10*3/uL — ABNORMAL HIGH (ref 0.1–1.0)
Monocytes Relative: 15 %
Neutro Abs: 7.5 10*3/uL (ref 1.7–7.7)
Neutrophils Relative %: 64 %
Platelets: 181 10*3/uL (ref 150–400)
RBC: 4.29 MIL/uL (ref 4.22–5.81)
RDW: 14.5 % (ref 11.5–15.5)
WBC: 11.7 10*3/uL — ABNORMAL HIGH (ref 4.0–10.5)
nRBC: 0 % (ref 0.0–0.2)

## 2019-06-30 LAB — BASIC METABOLIC PANEL
Anion gap: 16 — ABNORMAL HIGH (ref 5–15)
BUN: 89 mg/dL — ABNORMAL HIGH (ref 6–20)
CO2: 32 mmol/L (ref 22–32)
Calcium: 8.8 mg/dL — ABNORMAL LOW (ref 8.9–10.3)
Chloride: 90 mmol/L — ABNORMAL LOW (ref 98–111)
Creatinine, Ser: 2.69 mg/dL — ABNORMAL HIGH (ref 0.61–1.24)
GFR calc Af Amer: 29 mL/min — ABNORMAL LOW (ref >60)
GFR calc non Af Amer: 25 mL/min — ABNORMAL LOW (ref >60)
Glucose, Bld: 204 mg/dL — ABNORMAL HIGH (ref 70–99)
Potassium: 3.3 mmol/L — ABNORMAL LOW (ref 3.5–5.1)
Sodium: 138 mmol/L (ref 135–145)

## 2019-06-30 LAB — CULTURE, BLOOD (ROUTINE X 2)
Culture: NO GROWTH
Culture: NO GROWTH
Special Requests: ADEQUATE
Special Requests: ADEQUATE

## 2019-06-30 LAB — MAGNESIUM: Magnesium: 2.3 mg/dL (ref 1.7–2.4)

## 2019-06-30 MED ORDER — PREDNISONE 20 MG PO TABS
40.0000 mg | ORAL_TABLET | Freq: Every day | ORAL | Status: DC
  Administered 2019-07-01 – 2019-07-02 (×2): 40 mg via ORAL
  Filled 2019-06-30 (×2): qty 2

## 2019-06-30 MED ORDER — ADULT MULTIVITAMIN W/MINERALS CH
1.0000 | ORAL_TABLET | Freq: Every day | ORAL | Status: DC
  Administered 2019-06-30 – 2019-07-04 (×5): 1 via ORAL
  Filled 2019-06-30 (×5): qty 1

## 2019-06-30 MED ORDER — INSULIN ASPART 100 UNIT/ML ~~LOC~~ SOLN
0.0000 [IU] | Freq: Every day | SUBCUTANEOUS | Status: DC
  Administered 2019-06-30: 2 [IU] via SUBCUTANEOUS
  Administered 2019-07-01 – 2019-07-02 (×2): 3 [IU] via SUBCUTANEOUS

## 2019-06-30 MED ORDER — INSULIN ASPART 100 UNIT/ML ~~LOC~~ SOLN
0.0000 [IU] | Freq: Three times a day (TID) | SUBCUTANEOUS | Status: DC
  Administered 2019-06-30: 15 [IU] via SUBCUTANEOUS
  Administered 2019-07-01: 09:00:00 4 [IU] via SUBCUTANEOUS
  Administered 2019-07-01: 15 [IU] via SUBCUTANEOUS
  Administered 2019-07-01: 11 [IU] via SUBCUTANEOUS
  Administered 2019-07-02: 09:00:00 4 [IU] via SUBCUTANEOUS
  Administered 2019-07-02: 13:00:00 15 [IU] via SUBCUTANEOUS
  Administered 2019-07-02: 19:00:00 11 [IU] via SUBCUTANEOUS
  Administered 2019-07-03: 13:00:00 4 [IU] via SUBCUTANEOUS
  Administered 2019-07-03: 09:00:00 7 [IU] via SUBCUTANEOUS
  Administered 2019-07-03 – 2019-07-04 (×2): 4 [IU] via SUBCUTANEOUS
  Administered 2019-07-04: 7 [IU] via SUBCUTANEOUS

## 2019-06-30 MED ORDER — POTASSIUM CHLORIDE CRYS ER 20 MEQ PO TBCR
40.0000 meq | EXTENDED_RELEASE_TABLET | ORAL | Status: AC
  Administered 2019-06-30 (×2): 40 meq via ORAL
  Filled 2019-06-30 (×2): qty 2

## 2019-06-30 MED ORDER — ENSURE MAX PROTEIN PO LIQD
11.0000 [oz_av] | Freq: Every day | ORAL | Status: DC
  Administered 2019-07-01: 11 [oz_av] via ORAL
  Filled 2019-06-30 (×5): qty 330

## 2019-06-30 MED ORDER — INSULIN ASPART 100 UNIT/ML ~~LOC~~ SOLN
4.0000 [IU] | Freq: Three times a day (TID) | SUBCUTANEOUS | Status: DC
  Administered 2019-06-30 – 2019-07-01 (×3): 4 [IU] via SUBCUTANEOUS

## 2019-06-30 NOTE — Progress Notes (Signed)
Patient ID: [name]., male   DOB: 06/05/1961, 58 y.o.   MRN: [id number]  PROGRESS NOTE    [name].  NID:[id number] DOB: Apr 16, 1961 DOA: 06/25/2019 PCP: Caren Macadam, MD   Brief Narrative:  58 year old male with history of COPD, chronic hypoxic/hypercapnic respiratory failure on 2 to 3 L nasal cannula at home but had been switched to trilogy vent at home after recent hospitalization and discharge home, non adherent to CPAP, OSA, chronic renal disease stage III-IV, chronic diastolic heart failure, multiple recent hospitalizations with worsening respiratory failure requiring intubation presented with worsening shortness of breath on 06/25/2019.  He was found to be hypoxic/hypercapnic and failed NIV therapy and required intubation.  He was admitted under CCM service and also required vasopressors.  COVID-19 testing was negative.  Initially started on broad-spectrum antibiotics.  He was also treated with intravenous Lasix and diuresed well.  He was extubated on 06/27/2019 and transferred to [facility] service on 06/29/2019.  Palliative care was also consulted.  Assessment & Plan:  Acute on chronic hypoxic and hypercarbic respiratory failure COPD exacerbation -Intubated on admission and extubated on 06/27/2019.  Transferred to [facility] service on 06/29/2019. -Continue nebs and diuresis.  Currently on IV Solu-Medrol.  Switch to oral prednisone 40 mg daily today. -Still requiring 5 L oxygen via nasal cannula -COVID-19 testing negative.  Patient received 3 days of cefepime and Zithromax initially and 1 dose of vancomycin on admission.  Urine culture grew ESBL Klebsiella, possibly colonizer.  Acute on chronic diastolic heart failure -Limited echo during this hospitalization was poor study.  Limited echo on 02/22/2019 showed EF of 55 to 60% -Strict input and output.  Daily weights.  Fluid restriction.  Negative balance of 18,148.6 cc since admission. -Continue Coreg.  Was treated with  IV Lasix during the hospitalization.  Torsemide was started on 06/29/2019.  Continue torsemide 80 mg twice a day along with Imdur.  Chronic kidney disease stage IV -Recent creatinine baseline around 2.3-2.7 -Monitor.  Diuretics plan as above.  Not a candidate for dialysis. -Creatinine 2.16 today.  Anemia of chronic disease -From chronic any disease.  Hemoglobin stable  Hypokalemia -Replace.  Repeat a.m. labs  OSA/OHS, noncompliance to CPAP  Chronic A. Fib -Continue Eliquis and amiodarone.  Rate controlled  Diabetes mellitus type 2 uncontrolled with hyperglycemia.  - Continue Lantus and CBGs with SSI.  Generalized deconditioning Noncompliance -Overall prognosis is very poor.  Patient is noncompliant to medications as well as CPAP.  Patient has had recurrent recent hospitalization and multiple intubations.  This time, he apparently has decided that he would not want any more intubations. -His overall insight is very poor.  He probably does not understand the gravity of the situation and keeps mentioning that he does not want to go to a rehab facility and wants to go home where his wife can take care of him. -Recommend residential hospice. -Palliative care following.  Will follow further recommendations from palliative care.   DVT prophylaxis: Heparin Code Status: DNR Family Communication: Spoke to patient at bedside Disposition Plan: Patient will benefit from hospice Consultants: PCCM/palliative care  Procedures: Intubation/extubation Limited echo: Very poor quality  Antimicrobials:  Anti-infectives (From admission, onward)   Start     Dose/Rate Route Frequency Ordered Stop   06/26/19 1430  vancomycin (VANCOCIN) IVPB 1000 mg/200 mL premix  Status:  Discontinued     1,000 mg 200 mL/hr over 60 Minutes Intravenous Every 24 hours 06/25/19 1331 06/26/19 1054   06/26/19 1000  azithromycin (ZITHROMAX) 250 mg in dextrose 5 % 125 mL IVPB  Status:  Discontinued     250 mg 125 mL/hr  over 60 Minutes Intravenous Every 24 hours 06/25/19 1308 06/28/19 0816   06/26/19 0000  ceFEPIme (MAXIPIME) 2 g in sodium chloride 0.9 % 100 mL IVPB  Status:  Discontinued     2 g 200 mL/hr over 30 Minutes Intravenous Every 12 hours 06/25/19 1308 06/28/19 0816   06/25/19 1430  vancomycin (VANCOCIN) 2,250 mg in sodium chloride 0.9 % 500 mL IVPB  Status:  Discontinued     2,250 mg 250 mL/hr over 120 Minutes Intravenous  Once 06/25/19 1321 06/25/19 1350   06/25/19 1430  vancomycin (VANCOCIN) 2,500 mg in sodium chloride 0.9 % 500 mL IVPB     2,500 mg 250 mL/hr over 120 Minutes Intravenous  Once 06/25/19 1350 06/25/19 2043   06/25/19 1000  ceFEPIme (MAXIPIME) 2 g in sodium chloride 0.9 % 100 mL IVPB     2 g 200 mL/hr over 30 Minutes Intravenous  Once 06/25/19 0959 06/25/19 1211   06/25/19 1000  azithromycin (ZITHROMAX) 500 mg in sodium chloride 0.9 % 250 mL IVPB     500 mg 250 mL/hr over 60 Minutes Intravenous  Once 06/25/19 0959 06/25/19 1130       Subjective: Patient seen and examined at bedside.  He is a very poor historian.  He denies worsening shortness of breath, chest pain, fever or vomiting.  Objective: Vitals:   06/29/19 2055 06/30/19 0613 06/30/19 0815 06/30/19 0818  BP: (!) 149/95 (!) 114/91    Pulse: 88 88    Resp: 18 20    Temp: 98.5 F (36.9 C) (!) 97.5 F (36.4 C)    TempSrc: Oral Oral    SpO2: 99% 98% 98% 98%  Weight:      Height:        Intake/Output Summary (Last 24 hours) at 06/30/2019 1048 Last data filed at 06/30/2019 0927 Gross per 24 hour  Intake 1020 ml  Output 7750 ml  Net -6730 ml   Filed Weights   06/25/19 0819 06/28/19 0340  Weight: 105 kg 107.5 kg    Examination:  General exam: Chronically ill looking.  Very poor historian.  No distress.  Respiratory system: Bilateral decreased breath sounds at bases with bibasilar crackles.  No wheezing  cardiovascular system: Rate controlled, S1-S2 heard Gastrointestinal system: Abdomen is nondistended,  soft and nontender. Normal bowel sounds heard. Extremities: No cyanosis; lower extremity edema present  Data Reviewed: I have personally reviewed following labs and imaging studies  CBC: Recent Labs  Lab 06/25/19 0845  06/26/19 0514 06/27/19 0439 06/27/19 0501 06/28/19 0258 06/30/19 0417  WBC 9.7  --  10.9* 11.5*  --  11.3* 11.7*  NEUTROABS 6.4  --   --   --   --   --  7.5  HGB 11.8*   < > 11.0* 10.6* 11.6* 11.0* 12.2*  HCT 43.2   < > 38.3* 36.4* 34.0* 38.0* 41.0  MCV 104.6*  --  99.2 96.6  --  97.9 95.6  PLT 201  --  215 195  --  202 181   < > = values in this interval not displayed.   Basic Metabolic Panel: Recent Labs  Lab 06/25/19 1059  06/26/19 0514 06/27/19 0439 06/27/19 0501 06/28/19 0258 06/30/19 0417  NA 140   < > 144 146* 145 149* 138  K 5.9*   < > 4.3 3.6 3.5 3.4* 3.3*  CL 106  --  105 103  --  101 90*  CO2 25  --  28 29  --  33* 32  GLUCOSE 212*  --  195* 292*  --  213* 204*  BUN 45*  --  50* 71*  --  83* 89*  CREATININE 2.44*  --  2.65* 2.89*  --  2.81* 2.69*  CALCIUM 8.7*  --  9.6 9.6  --  8.8* 8.8*  MG  --   --  2.4 2.4  --  2.6* 2.3  PHOS  --   --   --  2.5  --  4.4  --    < > = values in this interval not displayed.   GFR: Estimated Creatinine Clearance: 37.3 mL/min (A) (by C-G formula based on SCr of 2.69 mg/dL (H)). Liver Function Tests: No results for input(s): AST, ALT, ALKPHOS, BILITOT, PROT, ALBUMIN in the last 168 hours. No results for input(s): LIPASE, AMYLASE in the last 168 hours. No results for input(s): AMMONIA in the last 168 hours. Coagulation Profile: No results for input(s): INR, PROTIME in the last 168 hours. Cardiac Enzymes: No results for input(s): CKTOTAL, CKMB, CKMBINDEX, TROPONINI in the last 168 hours. BNP (last 3 results) No results for input(s): PROBNP in the last 8760 hours. HbA1C: No results for input(s): HGBA1C in the last 72 hours. CBG: Recent Labs  Lab 06/29/19 0749 06/29/19 1224 06/29/19 1726 06/29/19 2042  06/30/19 0744  GLUCAP 208* 252* 146* 171* 252*   Lipid Profile: No results for input(s): CHOL, HDL, LDLCALC, TRIG, CHOLHDL, LDLDIRECT in the last 72 hours. Thyroid Function Tests: No results for input(s): TSH, T4TOTAL, FREET4, T3FREE, THYROIDAB in the last 72 hours. Anemia Panel: No results for input(s): VITAMINB12, FOLATE, FERRITIN, TIBC, IRON, RETICCTPCT in the last 72 hours. Sepsis Labs: Recent Labs  Lab 06/25/19 1059 06/25/19 1216 06/25/19 1534 06/25/19 1941 06/26/19 0821  PROCALCITON 0.10  --   --   --   --   LATICACIDVEN  --  1.3 2.2* 3.1* 1.8    Recent Results (from the past 240 hour(s))  SARS Coronavirus 2 by RT PCR (hospital order, performed in [facility] hospital lab) Nasopharyngeal Nasopharyngeal Swab     Status: None   Collection Time: 06/25/19  8:45 AM   Specimen: Nasopharyngeal Swab  Result Value Ref Range Status   SARS Coronavirus 2 NEGATIVE NEGATIVE Final    Comment: (NOTE) If result is NEGATIVE SARS-CoV-2 target nucleic acids are NOT DETECTED. The SARS-CoV-2 RNA is generally detectable in upper and lower  respiratory specimens during the acute phase of infection. The lowest  concentration of SARS-CoV-2 viral copies this assay can detect is 250  copies / mL. A negative result does not preclude SARS-CoV-2 infection  and should not be used as the sole basis for treatment or other  patient management decisions.  A negative result may occur with  improper specimen collection / handling, submission of specimen other  than nasopharyngeal swab, presence of viral mutation(s) within the  areas targeted by this assay, and inadequate number of viral copies  (<250 copies / mL). A negative result must be combined with clinical  observations, patient history, and epidemiological information. If result is POSITIVE SARS-CoV-2 target nucleic acids are DETECTED. The SARS-CoV-2 RNA is generally detectable in upper and lower  respiratory specimens dur ing the acute phase of  infection.  Positive  results are indicative of active infection with SARS-CoV-2.  Clinical  correlation with patient history and other diagnostic  information is  necessary to determine patient infection status.  Positive results do  not rule out bacterial infection or co-infection with other viruses. If result is PRESUMPTIVE POSTIVE SARS-CoV-2 nucleic acids MAY BE PRESENT.   A presumptive positive result was obtained on the submitted specimen  and confirmed on repeat testing.  While 2019 novel coronavirus  (SARS-CoV-2) nucleic acids may be present in the submitted sample  additional confirmatory testing may be necessary for epidemiological  and / or clinical management purposes  to differentiate between  SARS-CoV-2 and other Sarbecovirus currently known to infect humans.  If clinically indicated additional testing with an alternate test  methodology [phone]) is advised. The SARS-CoV-2 RNA is generally  detectable in upper and lower respiratory sp ecimens during the acute  phase of infection. The expected result is Negative. Fact Sheet for Patients:  StrictlyIdeas.no Fact Sheet for Healthcare Providers: BankingDealers.co.za This test is not yet approved or cleared by the [place] FDA and has been authorized for detection and/or diagnosis of SARS-CoV-2 by FDA under an Emergency Use Authorization (EUA).  This EUA will remain in effect (meaning this test can be used) for the duration of the COVID-19 declaration under Section 564(b)(1) of the Act, 21 U.S.C. section 360bbb-3(b)(1), unless the authorization is terminated or revoked sooner. Performed at [facility], [place] [address] [place], [place] [address]   Urine culture     Status: Abnormal   Collection Time: 06/25/19 12:16 PM   Specimen: Urine, Clean Catch  Result Value Ref Range Status   Specimen Description URINE, CLEAN CATCH  Final   Special Requests   Final    NONE  Performed at [facility], [place] [address] [place], [place] [address]    Culture (A)  Final    >=100,000 COLONIES/mL KLEBSIELLA PNEUMONIAE Confirmed Extended Spectrum Beta-Lactamase Producer (ESBL).  In bloodstream infections from ESBL organisms, carbapenems are preferred over piperacillin/tazobactam. They are shown to have a lower risk of mortality.    Report Status 06/27/2019 FINAL  Final   Organism ID, Bacteria KLEBSIELLA PNEUMONIAE (A)  Final      Susceptibility   Klebsiella pneumoniae - MIC*    AMPICILLIN >=32 RESISTANT Resistant     CEFAZOLIN >=64 RESISTANT Resistant     CEFTRIAXONE >=64 RESISTANT Resistant     CIPROFLOXACIN 1 SENSITIVE Sensitive     GENTAMICIN <=1 SENSITIVE Sensitive     IMIPENEM <=0.25 SENSITIVE Sensitive     NITROFURANTOIN 64 INTERMEDIATE Intermediate     TRIMETH/SULFA >=320 RESISTANT Resistant     AMPICILLIN/SULBACTAM >=32 RESISTANT Resistant     PIP/TAZO 16 SENSITIVE Sensitive     Extended ESBL POSITIVE Resistant     * >=100,000 COLONIES/mL KLEBSIELLA PNEUMONIAE  Culture, blood (routine x 2)     Status: None   Collection Time: 06/25/19 12:32 PM   Specimen: BLOOD LEFT WRIST  Result Value Ref Range Status   Specimen Description BLOOD LEFT WRIST  Final   Special Requests   Final    BOTTLES DRAWN AEROBIC ONLY Blood Culture adequate volume   Culture   Final    NO GROWTH 5 DAYS Performed at [facility], [address], [place], [place] [address]    Report Status 06/30/2019 FINAL  Final  Culture, blood (routine x 2)     Status: None   Collection Time: 06/25/19 12:54 PM   Specimen: BLOOD RIGHT HAND  Result Value Ref Range Status   Specimen Description BLOOD RIGHT HAND  Final  Special Requests   Final    BOTTLES DRAWN AEROBIC AND ANAEROBIC Blood Culture adequate volume   Culture   Final    NO GROWTH 5 DAYS Performed at [facility], [place] [address], [place], [place] [address]    Report Status 06/30/2019 FINAL  Final  MRSA PCR  Screening     Status: None   Collection Time: 06/25/19  3:00 PM   Specimen: Nasal Mucosa; Nasopharyngeal  Result Value Ref Range Status   MRSA by PCR NEGATIVE NEGATIVE Final    Comment:        The GeneXpert MRSA Assay (FDA approved for NASAL specimens only), is one component of a comprehensive MRSA colonization surveillance program. It is not intended to diagnose MRSA infection nor to guide or monitor treatment for MRSA infections. Performed at [facility], [place] [address] [place], [place] [address]   Respiratory Panel by PCR     Status: None   Collection Time: 06/25/19  4:40 PM   Specimen: Nasopharyngeal Swab; Respiratory  Result Value Ref Range Status   Adenovirus NOT DETECTED NOT DETECTED Final   Coronavirus 229E NOT DETECTED NOT DETECTED Final    Comment: (NOTE) The Coronavirus on the Respiratory Panel, DOES NOT test for the novel  Coronavirus (2019 nCoV)    Coronavirus HKU1 NOT DETECTED NOT DETECTED Final   Coronavirus NL63 NOT DETECTED NOT DETECTED Final   Coronavirus OC43 NOT DETECTED NOT DETECTED Final   Metapneumovirus NOT DETECTED NOT DETECTED Final   Rhinovirus / Enterovirus NOT DETECTED NOT DETECTED Final   Influenza A NOT DETECTED NOT DETECTED Final   Influenza B NOT DETECTED NOT DETECTED Final   Parainfluenza Virus 1 NOT DETECTED NOT DETECTED Final   Parainfluenza Virus 2 NOT DETECTED NOT DETECTED Final   Parainfluenza Virus 3 NOT DETECTED NOT DETECTED Final   Parainfluenza Virus 4 NOT DETECTED NOT DETECTED Final   Respiratory Syncytial Virus NOT DETECTED NOT DETECTED Final   Bordetella pertussis NOT DETECTED NOT DETECTED Final   Chlamydophila pneumoniae NOT DETECTED NOT DETECTED Final   Mycoplasma pneumoniae NOT DETECTED NOT DETECTED Final    Comment: Performed at [facility], [place] [address] [place], [place] [address]  Culture, respiratory (non-expectorated)     Status: None   Collection Time: 06/25/19  4:45 PM   Specimen: Tracheal Aspirate;  Respiratory  Result Value Ref Range Status   Specimen Description TRACHEAL ASPIRATE  Final   Special Requests NONE  Final   Gram Stain   Final    FEW WBC PRESENT,BOTH PMN AND MONONUCLEAR NO ORGANISMS SEEN    Culture   Final    Consistent with normal respiratory flora. Performed at [facility], [place] [address], [place],  [address]    Report Status 06/29/2019 FINAL  Final         Radiology Studies: No results found.      Scheduled Meds: . AeroChamber Plus Flo-Vu Large  1 each Other Once  . amiodarone  100 mg Oral Daily  . apixaban  5 mg Oral BID  . arformoterol  15 mcg Nebulization BID  . budesonide (PULMICORT) nebulizer solution  0.25 mg Nebulization BID  . carvedilol  12.5 mg Oral BID WC  . Chlorhexidine Gluconate Cloth  6 each Topical Daily  . dextromethorphan-guaiFENesin  1 tablet Oral BID  . HYDROcodone-acetaminophen  1 tablet Oral Q6H  . insulin aspart  0-15 Units Subcutaneous TID WC & HS  . insulin glargine  25  Units Subcutaneous Daily  . ipratropium  2 puff Inhalation Once  . isosorbide mononitrate  30 mg Oral QHS  . mouth rinse  15 mL Mouth Rinse BID  . methylPREDNISolone (SOLU-MEDROL) injection  40 mg Intravenous Q24H  . multivitamin with minerals  1 tablet Oral Daily  . pantoprazole (PROTONIX) IV  40 mg Intravenous QHS  . potassium chloride  40 mEq Oral Q4H  . Ensure Max Protein  11 oz Oral Daily  . rosuvastatin  20 mg Oral q1800  . topiramate  100 mg Oral QHS  . torsemide  80 mg Oral BID   Continuous Infusions:         Aline August, MD [facility] 06/30/2019, 10:48 AM

## 2019-06-30 NOTE — Progress Notes (Signed)
RT placed patient on BIPAP auto level. 6L O2 bleed in needed. Patient tolerating well at this time.

## 2019-06-30 NOTE — Progress Notes (Signed)
Nutrition Follow-up  DOCUMENTATION CODES:   Obesity unspecified  INTERVENTION:   -D/c Prostat -Ensure Max po daily, each supplement provides 150 kcal and 30 grams of protein.  -Magic cup BID with meals, each supplement provides 290 kcal and 9 grams of protein -MVI with minerals daily  NUTRITION DIAGNOSIS:   Increased nutrient needs related to chronic illness as evidenced by estimated needs.  Ongoing  GOAL:   Patient will meet greater than or equal to 90% of their needs  Progressing   MONITOR:   PO intake, Supplement acceptance, Diet advancement, Labs, Weight trends, Skin, I & O's  REASON FOR ASSESSMENT:   Ventilator    ASSESSMENT:   59 yo male admitted with SOB x 3 days requiring intubation on admission. PMH includes CAD, ischemic cardiomyopathy, obesity, CHF, HTN, DM, LE edema, brain tumor s/p resection 2014.  11/6- extubated  Reviewed I/O's: -5.8 L x 24 hours and -17.6 L since admission  UOP: 7 L x 24 hours  Pt has been advanced to a dysphagia 3 diet. Pt with good appetite; noted meal completion 100%.   Palliative care continues to follow for goals for care discussions. Plan for no escalation of care. Pt may transition to hospice care.   Medications reviewed and include methylprednisone.   Labs reviewed: K: 3.3, CBGS: 146-252 (inpatient orders for glycemic contorl are 0-15 units insulin aspart TID with meals and q HS and 25 units insulin glargine daily).   Diet Order:   Diet Order            DIET DYS 3 Room service appropriate? Yes; Fluid consistency: Thin  Diet effective now              EDUCATION NEEDS:   Not appropriate for education at this time  Skin:  Skin Assessment: Reviewed RN Assessment(MASD to groin)  Last BM:  06/29/19  Height:   Ht Readings from Last 1 Encounters:  06/25/19 5\' 11"  (1.803 m)    Weight:   Wt Readings from Last 1 Encounters:  06/28/19 107.5 kg    Ideal Body Weight:  78.2 kg  BMI:  Body mass index is 33.05  kg/m.  Estimated Nutritional Needs:   Kcal:  2000-2200  Protein:  100-115 grams  Fluid:  > 2 L    [name], RD, LDN, [place] Registered Dietitian II Certified Diabetes Care and Education Specialist Pager: [phone] After hours Pager: [phone]

## 2019-06-30 NOTE — Progress Notes (Signed)
Inpatient Diabetes Program Recommendations  AACE/ADA: New Consensus Statement on Inpatient Glycemic Control (2015)  Target Ranges:  Prepandial:   less than 140 mg/dL      Peak postprandial:   less than 180 mg/dL (1-2 hours)      Critically ill patients:  140 - 180 mg/dL   Lab Results  Component Value Date   GLUCAP 352 (H) 06/30/2019   HGBA1C 7.8 (H) 04/14/2019    Review of Glycemic Control Results for [name], [name] (MRN [id number]) as of 06/30/2019 14:38  Ref. Range 06/29/2019 07:49 06/29/2019 12:24 06/29/2019 17:26 06/29/2019 20:42 06/30/2019 07:44 06/30/2019 12:03  Glucose-Capillary Latest Ref Range: 70 - 99 mg/dL 208 (H) 252 (H) 146 (H) 171 (H) 252 (H) 352 (H)   Diabetes history: DM 2 Outpatient Diabetes medications: Lantus 45 units qhs Current orders for Inpatient glycemic control:  Lantus 25 units Daily Novolog 0-15 units tid + hs  A1c 7.8% on 8/24  Inpatient Diabetes Program Recommendations:    Solumedrol now transitioned to PO prednisone 40 mg Daily.  Pt may benefit from Novolog 4 units tid meal coverage if eating >50% of meals.  Thanks,  Tama Headings RN, MSN, BC-ADM Inpatient Diabetes Coordinator Team Pager [phone] (8a-5p)

## 2019-06-30 NOTE — Progress Notes (Signed)
Physical Therapy Treatment Patient Details Name: [name]. MRN: [id number] DOB: [birth year] Today's Date: 06/30/2019    History of Present Illness 58 yo male with pmh COPD, Chronic hypoxic/hypercapnic resp failure on 2-3L [place] at home, non adherent to CPAP, OSA, obesity CKD3-4, HFpEF who presents for the second month in a row with sob found to be hypoxic/hypercapnic resp failure failed NIV therapy and req intubation. Pt intubated 11/4-11/6.    PT Comments    Pt in bed upon PT arrival, declined PT until PT was able to find bariatric recliner. Pt was then agreeable to attempt standing and transfer to recliner. Pt demos good bed mobility, needing only minA to come to sitting EOB with use of bed rails and elevated HOB. Pt was then able to come to standing with minA of 2 using HHA and take wide, shuffling steps to pivot to the recliner. Pt was able to hold the stance with minimal support, and reported no SOB or change in symptoms during ambulation with 3L O2. Pt will continue to benefit from skilled PT to address functional mobility and independence/endurance with ambulation.     Follow Up Recommendations  Home health PT;Supervision for mobility/OOB     Equipment Recommendations  None recommended by PT    Recommendations for Other Services       Precautions / Restrictions Precautions Precautions: Fall;Other (comment) Precaution Comments: R eye blind, Monitor O2 sats and HR Restrictions Weight Bearing Restrictions: No Other Position/Activity Restrictions: on O2 via cannula    Mobility  Bed Mobility Overal bed mobility: Needs Assistance Bed Mobility: Supine to Sit;Rolling Rolling: Min assist   Supine to sit: +2 for safety/equipment;Min assist;HOB elevated     General bed mobility comments: heavy use of rails  Transfers Overall transfer level: Needs assistance Equipment used: 2 person hand held assist Transfers: Sit to/from [employer] Transfers Sit to Stand:  Min assist;+2 physical assistance;+2 safety/equipment Stand pivot transfers: Min assist;+2 physical assistance;+2 safety/equipment       General transfer comment: bilat assist to power up and stabilize balance, stand pivot to recliner with minA of 2  Ambulation/Gait Ambulation/Gait assistance: +2 physical assistance;Min assist Gait Distance (Feet): 3 Feet Assistive device: 2 person hand held assist Gait Pattern/deviations: Wide base of support;Shuffle Gait velocity: decreased Gait velocity interpretation: <1.31 ft/sec, indicative of household ambulator General Gait Details: sidestepping bedside with +2 HHA for safety. no bariatric RW in room, delivered at end of session   Stairs             Wheelchair Mobility    Modified Rankin (Stroke Patients Only)       Balance Overall balance assessment: Needs assistance Sitting-balance support: Feet supported;Single extremity supported Sitting balance-Leahy Scale: Fair Sitting balance - Comments: supervision for sitting balance   Standing balance support: Bilateral upper extremity supported   Standing balance comment: reliant on external support to maintain standing balance. Static stand x 3 minutes +2 HH/min assist                            Cognition Arousal/Alertness: Awake/alert Behavior During Therapy: WFL for tasks assessed/performed Overall Cognitive Status: History of cognitive impairments - at baseline Area of Impairment: Safety/judgement;Awareness;Problem solving;Attention                     Memory: Decreased short-term memory Following Commands: Follows one step commands consistently Safety/Judgement: Decreased awareness of safety;Decreased awareness of deficits Awareness: Intellectual Problem Solving:  Slow processing;Requires verbal cues General Comments: Poor insight and awareness into current status. Pt is very particular.      Exercises      General Comments        Pertinent  Vitals/Pain Pain Assessment: No/denies pain    Home Living                      Prior Function            PT Goals (current goals can now be found in the care plan section) Acute Rehab PT Goals Patient Stated Goal: home PT Goal Formulation: With patient/family Time For Goal Achievement: 07/12/19 Potential to Achieve Goals: Fair Progress towards PT goals: Progressing toward goals(bariatric recliner brought to room, bariatric RW delivered to room after session to facilitate progression of amb.)    Frequency    Min 3X/week      PT Plan Current plan remains appropriate    Co-evaluation              AM-PAC PT "6 Clicks" Mobility   Outcome Measure  Help needed turning from your back to your side while in a flat bed without using bedrails?: None Help needed moving from lying on your back to sitting on the side of a flat bed without using bedrails?: A Little Help needed moving to and from a bed to a chair (including a wheelchair)?: A Little Help needed standing up from a chair using your arms (e.g., wheelchair or bedside chair)?: A Little Help needed to walk in hospital room?: A Little Help needed climbing 3-5 steps with a railing? : A Lot 6 Click Score: 18    End of Session Equipment Utilized During Treatment: Gait belt;Oxygen Activity Tolerance: Patient tolerated treatment well Patient left: with call bell/phone within reach;in chair;with family/visitor present Nurse Communication: Mobility status PT Visit Diagnosis: Muscle weakness (generalized) (M62.81);Difficulty in walking, not elsewhere classified (R26.2);Unsteadiness on feet (R26.81)     Time: 2336-1224 PT Time Calculation (min) (ACUTE ONLY): 63 min  Charges:  $Gait Training: 8-22 mins $Therapeutic Activity: 8-22 mins $Self Care/Home Management: 23-37                     Mickey Farber, PT, DPT   Acute Rehabilitation Department [phone]   Otho Bellows 06/30/2019, 4:53 PM

## 2019-07-01 ENCOUNTER — Ambulatory Visit: Payer: Self-pay

## 2019-07-01 DIAGNOSIS — E876 Hypokalemia: Secondary | ICD-10-CM

## 2019-07-01 LAB — BASIC METABOLIC PANEL
Anion gap: 15 (ref 5–15)
BUN: 83 mg/dL — ABNORMAL HIGH (ref 6–20)
CO2: 34 mmol/L — ABNORMAL HIGH (ref 22–32)
Calcium: 9.1 mg/dL (ref 8.9–10.3)
Chloride: 88 mmol/L — ABNORMAL LOW (ref 98–111)
Creatinine, Ser: 2.59 mg/dL — ABNORMAL HIGH (ref 0.61–1.24)
GFR calc Af Amer: 30 mL/min — ABNORMAL LOW (ref >60)
GFR calc non Af Amer: 26 mL/min — ABNORMAL LOW (ref >60)
Glucose, Bld: 184 mg/dL — ABNORMAL HIGH (ref 70–99)
Potassium: 3 mmol/L — ABNORMAL LOW (ref 3.5–5.1)
Sodium: 137 mmol/L (ref 135–145)

## 2019-07-01 LAB — MAGNESIUM: Magnesium: 2.1 mg/dL (ref 1.7–2.4)

## 2019-07-01 LAB — GLUCOSE, CAPILLARY
Glucose-Capillary: 153 mg/dL — ABNORMAL HIGH (ref 70–99)
Glucose-Capillary: 228 mg/dL — ABNORMAL HIGH (ref 70–99)
Glucose-Capillary: 259 mg/dL — ABNORMAL HIGH (ref 70–99)
Glucose-Capillary: 291 mg/dL — ABNORMAL HIGH (ref 70–99)
Glucose-Capillary: 336 mg/dL — ABNORMAL HIGH (ref 70–99)

## 2019-07-01 MED ORDER — INSULIN ASPART 100 UNIT/ML ~~LOC~~ SOLN
8.0000 [IU] | Freq: Three times a day (TID) | SUBCUTANEOUS | Status: DC
  Administered 2019-07-01 – 2019-07-03 (×6): 8 [IU] via SUBCUTANEOUS

## 2019-07-01 MED ORDER — POTASSIUM CHLORIDE CRYS ER 20 MEQ PO TBCR
40.0000 meq | EXTENDED_RELEASE_TABLET | ORAL | Status: AC
  Administered 2019-07-01 (×2): 40 meq via ORAL
  Filled 2019-07-01 (×2): qty 2

## 2019-07-01 MED ORDER — PANTOPRAZOLE SODIUM 40 MG PO TBEC
40.0000 mg | DELAYED_RELEASE_TABLET | Freq: Every day | ORAL | Status: DC
  Administered 2019-07-01 – 2019-07-03 (×3): 40 mg via ORAL
  Filled 2019-07-01 (×3): qty 1

## 2019-07-01 NOTE — Progress Notes (Signed)
Inpatient Diabetes Program Recommendations  AACE/ADA: New Consensus Statement on Inpatient Glycemic Control (2015)  Target Ranges:  Prepandial:   less than 140 mg/dL      Peak postprandial:   less than 180 mg/dL (1-2 hours)      Critically ill patients:  140 - 180 mg/dL   Lab Results  Component Value Date   GLUCAP 336 (H) 07/01/2019   HGBA1C 7.8 (H) 04/14/2019    Review of Glycemic Control Results for [name], [name] (MRN [id number]) as of 06/30/2019 14:38  Ref. Range 06/29/2019 07:49 06/29/2019 12:24 06/29/2019 17:26 06/29/2019 20:42 06/30/2019 07:44 06/30/2019 12:03  Glucose-Capillary Latest Ref Range: 70 - 99 mg/dL 208 (H) 252 (H) 146 (H) 171 (H) 252 (H) 352 (H)   Diabetes history: DM 2 Outpatient Diabetes medications: Lantus 45 units qhs Current orders for Inpatient glycemic control:  Lantus 25 units Daily Novolog 0-20 units tid + hs Novolog 4 units tid meal coverage  A1c 7.8% on 8/24  Inpatient Diabetes Program Recommendations:    If steroids remain at current dose pt may benefit from increasing meal coverage to Novolog 8 units tid.  Thanks,  Tama Headings RN, MSN, BC-ADM Inpatient Diabetes Coordinator Team Pager [phone] (8a-5p)

## 2019-07-01 NOTE — Progress Notes (Signed)
Patient ID: Frank Larsen., male   DOB: 07-Sep-1960, 58 y.o.   MRN: [id number]  PROGRESS NOTE    Frank Larsen.  TOI:[id number] DOB: Sep 12, 1960 DOA: 06/25/2019 PCP: Caren Macadam, MD   Brief Narrative:  58 year old male with history of COPD, chronic hypoxic/hypercapnic respiratory failure on 2 to 3 L nasal cannula at home but had been switched to trilogy vent at home after recent hospitalization and discharge home, non adherent to CPAP, OSA, chronic renal disease stage III-IV, chronic diastolic heart failure, multiple recent hospitalizations with worsening respiratory failure requiring intubation presented with worsening shortness of breath on 06/25/2019.  He was found to be hypoxic/hypercapnic and failed NIV therapy and required intubation.  He was admitted under CCM service and also required vasopressors.  COVID-19 testing was negative.  Initially started on broad-spectrum antibiotics.  He was also treated with intravenous Lasix and diuresed well.  He was extubated on 06/27/2019 and transferred to [facility] service on 06/29/2019.  Palliative care was also consulted.  Assessment & Plan:  Acute on chronic hypoxic and hypercarbic respiratory failure COPD exacerbation -Intubated on admission and extubated on 06/27/2019.  Transferred to [facility] service on 06/29/2019. -Continue nebs and diuresis.  Solu-Medrol has been switched to prednisone 40 mg daily from today onwards.  Finish 5 to 7-day course of prednisone therapy. -Still requiring 5 L oxygen via nasal cannula -COVID-19 testing negative.  Patient received 3 days of cefepime and Zithromax initially and 1 dose of vancomycin on admission.  Urine culture grew ESBL Klebsiella, possibly colonizer.  Acute on chronic diastolic heart failure -Limited echo during this hospitalization was poor study.  Limited echo on 02/22/2019 showed EF of 55 to 60% -Strict input and output.  Daily weights.  Fluid restriction.  Negative balance of 22,260.6 cc  since admission. -Continue Coreg.  Was treated with IV Lasix during the hospitalization.  Torsemide was started on 06/29/2019.  Continue torsemide 80 mg twice a day along with Imdur.  Chronic kidney disease stage IV -Recent creatinine baseline around 2.3-2.7 -Monitor.  Diuretics plan as above.  Not a candidate for dialysis. -Creatinine 2.59 today.  Anemia of chronic disease -From chronic any disease.  Hemoglobin stable  Hypokalemia -Replace.  Repeat a.m. labs  OSA/OHS, noncompliance to CPAP  Chronic A. Fib -Continue Eliquis and amiodarone.  Rate controlled  Diabetes mellitus type 2 uncontrolled with hyperglycemia.  - Continue Lantus and CBGs with SSI.  Generalized deconditioning Noncompliance -Overall prognosis is very poor.  Patient is noncompliant to medications as well as CPAP.  Patient has had recurrent recent hospitalization and multiple intubations.  This time, he apparently has decided that he would not want any more intubations. -His overall insight is very poor.  He probably does not understand the gravity of the situation and keeps mentioning that he does not want to go to a rehab facility and wants to go home where his wife can take care of him. -Recommend residential hospice. -Palliative care following.  Will follow further recommendations from palliative care.  Obesity -Outpatient follow-up   DVT prophylaxis: Heparin Code Status: DNR Family Communication: Spoke to patient at bedside.  Called wife/[name] on phone on 07/01/2019 which did not pick up  disposition Plan: Patient will benefit from hospice Consultants: PCCM/palliative care  Procedures: Intubation/extubation Limited echo: Very poor quality  Antimicrobials:  Anti-infectives (From admission, onward)   Start     Dose/Rate Route Frequency Ordered Stop   06/26/19 1430  vancomycin (VANCOCIN) IVPB 1000 mg/200 mL premix  Status:  Discontinued     1,000 mg 200 mL/hr over 60 Minutes Intravenous Every 24  hours 06/25/19 1331 06/26/19 1054   06/26/19 1000  azithromycin (ZITHROMAX) 250 mg in dextrose 5 % 125 mL IVPB  Status:  Discontinued     250 mg 125 mL/hr over 60 Minutes Intravenous Every 24 hours 06/25/19 1308 06/28/19 0816   06/26/19 0000  ceFEPIme (MAXIPIME) 2 g in sodium chloride 0.9 % 100 mL IVPB  Status:  Discontinued     2 g 200 mL/hr over 30 Minutes Intravenous Every 12 hours 06/25/19 1308 06/28/19 0816   06/25/19 1430  vancomycin (VANCOCIN) 2,250 mg in sodium chloride 0.9 % 500 mL IVPB  Status:  Discontinued     2,250 mg 250 mL/hr over 120 Minutes Intravenous  Once 06/25/19 1321 06/25/19 1350   06/25/19 1430  vancomycin (VANCOCIN) 2,500 mg in sodium chloride 0.9 % 500 mL IVPB     2,500 mg 250 mL/hr over 120 Minutes Intravenous  Once 06/25/19 1350 06/25/19 2043   06/25/19 1000  ceFEPIme (MAXIPIME) 2 g in sodium chloride 0.9 % 100 mL IVPB     2 g 200 mL/hr over 30 Minutes Intravenous  Once 06/25/19 0959 06/25/19 1211   06/25/19 1000  azithromycin (ZITHROMAX) 500 mg in sodium chloride 0.9 % 250 mL IVPB     500 mg 250 mL/hr over 60 Minutes Intravenous  Once 06/25/19 0959 06/25/19 1130       Subjective: Patient seen and examined at bedside.  He is a very poor historian.  Denies overnight fever, vomiting, worsening shortness of breath or chest pain.  Objective: Vitals:   07/01/19 0637 07/01/19 0808 07/01/19 0810 07/01/19 1000  BP: 120/75   139/81  Pulse: 80   83  Resp: 18     Temp: 98.3 F (36.8 C)   98.2 F (36.8 C)  TempSrc: Oral   Oral  SpO2: 94% 95% 95% 99%  Weight:      Height:        Intake/Output Summary (Last 24 hours) at 07/01/2019 1039 Last data filed at 07/01/2019 0631 Gross per 24 hour  Intake 888 ml  Output 6000 ml  Net -5112 ml   Filed Weights   06/25/19 0819 06/28/19 0340  Weight: 105 kg 107.5 kg    Examination:  General exam: No acute distress.  Chronically ill looking.  Very poor historian.   Respiratory system: Bilateral decreased breath  sounds at bases with basilar crackles  cardiovascular system: S1-S2 heard, rate controlled Gastrointestinal system: Abdomen is nondistended, obese, soft and nontender. Normal bowel sounds heard. Extremities: No cyanosis; lower extremity edema present  Data Reviewed: I have personally reviewed following labs and imaging studies  CBC: Recent Labs  Lab 06/25/19 0845  06/26/19 0514 06/27/19 0439 06/27/19 0501 06/28/19 0258 06/30/19 0417  WBC 9.7  --  10.9* 11.5*  --  11.3* 11.7*  NEUTROABS 6.4  --   --   --   --   --  7.5  HGB 11.8*   < > 11.0* 10.6* 11.6* 11.0* 12.2*  HCT 43.2   < > 38.3* 36.4* 34.0* 38.0* 41.0  MCV 104.6*  --  99.2 96.6  --  97.9 95.6  PLT 201  --  215 195  --  202 181   < > = values in this interval not displayed.   Basic Metabolic Panel: Recent Labs  Lab 06/26/19 0514 06/27/19 0439 06/27/19 0501 06/28/19 0258 06/30/19 0417 07/01/19 0236  NA 144 146*  145 149* 138 137  K 4.3 3.6 3.5 3.4* 3.3* 3.0*  CL 105 103  --  101 90* 88*  CO2 28 29  --  33* 32 34*  GLUCOSE 195* 292*  --  213* 204* 184*  BUN 50* 71*  --  83* 89* 83*  CREATININE 2.65* 2.89*  --  2.81* 2.69* 2.59*  CALCIUM 9.6 9.6  --  8.8* 8.8* 9.1  MG 2.4 2.4  --  2.6* 2.3 2.1  PHOS  --  2.5  --  4.4  --   --    GFR: Estimated Creatinine Clearance: 38.8 mL/min (A) (by C-G formula based on SCr of 2.59 mg/dL (H)). Liver Function Tests: No results for input(s): AST, ALT, ALKPHOS, BILITOT, PROT, ALBUMIN in the last 168 hours. No results for input(s): LIPASE, AMYLASE in the last 168 hours. No results for input(s): AMMONIA in the last 168 hours. Coagulation Profile: No results for input(s): INR, PROTIME in the last 168 hours. Cardiac Enzymes: No results for input(s): CKTOTAL, CKMB, CKMBINDEX, TROPONINI in the last 168 hours. BNP (last 3 results) No results for input(s): PROBNP in the last 8760 hours. HbA1C: No results for input(s): HGBA1C in the last 72 hours. CBG: Recent Labs  Lab  06/30/19 1203 06/30/19 1647 06/30/19 1958 06/30/19 2359 07/01/19 0739  GLUCAP 352* 306* 221* 228* 153*   Lipid Profile: No results for input(s): CHOL, HDL, LDLCALC, TRIG, CHOLHDL, LDLDIRECT in the last 72 hours. Thyroid Function Tests: No results for input(s): TSH, T4TOTAL, FREET4, T3FREE, THYROIDAB in the last 72 hours. Anemia Panel: No results for input(s): VITAMINB12, FOLATE, FERRITIN, TIBC, IRON, RETICCTPCT in the last 72 hours. Sepsis Labs: Recent Labs  Lab 06/25/19 1059 06/25/19 1216 06/25/19 1534 06/25/19 1941 06/26/19 0821  PROCALCITON 0.10  --   --   --   --   LATICACIDVEN  --  1.3 2.2* 3.1* 1.8    Recent Results (from the past 240 hour(s))  SARS Coronavirus 2 by RT PCR (hospital order, performed in [place] hospital lab) Nasopharyngeal Nasopharyngeal Swab     Status: None   Collection Time: 06/25/19  8:45 AM   Specimen: Nasopharyngeal Swab  Result Value Ref Range Status   SARS Coronavirus 2 NEGATIVE NEGATIVE Final    Comment: (NOTE) If result is NEGATIVE SARS-CoV-2 target nucleic acids are NOT DETECTED. The SARS-CoV-2 RNA is generally detectable in upper and lower  respiratory specimens during the acute phase of infection. The lowest  concentration of SARS-CoV-2 viral copies this assay can detect is 250  copies / mL. A negative result does not preclude SARS-CoV-2 infection  and should not be used as the sole basis for treatment or other  patient management decisions.  A negative result may occur with  improper specimen collection / handling, submission of specimen other  than nasopharyngeal swab, presence of viral mutation(s) within the  areas targeted by this assay, and inadequate number of viral copies  (<250 copies / mL). A negative result must be combined with clinical  observations, patient history, and epidemiological information. If result is POSITIVE SARS-CoV-2 target nucleic acids are DETECTED. The SARS-CoV-2 RNA is generally detectable in upper  and lower  respiratory specimens dur ing the acute phase of infection.  Positive  results are indicative of active infection with SARS-CoV-2.  Clinical  correlation with patient history and other diagnostic information is  necessary to determine patient infection status.  Positive results do  not rule out bacterial infection or co-infection with other viruses.  If result is PRESUMPTIVE POSTIVE SARS-CoV-2 nucleic acids MAY BE PRESENT.   A presumptive positive result was obtained on the submitted specimen  and confirmed on repeat testing.  While 2019 novel coronavirus  (SARS-CoV-2) nucleic acids may be present in the submitted sample  additional confirmatory testing may be necessary for epidemiological  and / or clinical management purposes  to differentiate between  SARS-CoV-2 and other Sarbecovirus currently known to infect humans.  If clinically indicated additional testing with an alternate test  methodology [phone]) is advised. The SARS-CoV-2 RNA is generally  detectable in upper and lower respiratory sp ecimens during the acute  phase of infection. The expected result is Negative. Fact Sheet for Patients:  StrictlyIdeas.no Fact Sheet for Healthcare Providers: BankingDealers.co.za This test is not yet approved or cleared by the [place] FDA and has been authorized for detection and/or diagnosis of SARS-CoV-2 by FDA under an Emergency Use Authorization (EUA).  This EUA will remain in effect (meaning this test can be used) for the duration of the COVID-19 declaration under Section 564(b)(1) of the Act, 21 U.S.C. section 360bbb-3(b)(1), unless the authorization is terminated or revoked sooner. Performed at [facility], [place] [address], [place], [place] [address]   Urine culture     Status: Abnormal   Collection Time: 06/25/19 12:16 PM   Specimen: Urine, Clean Catch  Result Value Ref Range Status   Specimen Description  URINE, CLEAN CATCH  Final   Special Requests   Final    NONE Performed at [facility], [place] [address] [place], [place] [address]    Culture (A)  Final    >=100,000 COLONIES/mL KLEBSIELLA PNEUMONIAE Confirmed Extended Spectrum Beta-Lactamase Producer (ESBL).  In bloodstream infections from ESBL organisms, carbapenems are preferred over piperacillin/tazobactam. They are shown to have a lower risk of mortality.    Report Status 06/27/2019 FINAL  Final   Organism ID, Bacteria KLEBSIELLA PNEUMONIAE (A)  Final      Susceptibility   Klebsiella pneumoniae - MIC*    AMPICILLIN >=32 RESISTANT Resistant     CEFAZOLIN >=64 RESISTANT Resistant     CEFTRIAXONE >=64 RESISTANT Resistant     CIPROFLOXACIN 1 SENSITIVE Sensitive     GENTAMICIN <=1 SENSITIVE Sensitive     IMIPENEM <=0.25 SENSITIVE Sensitive     NITROFURANTOIN 64 INTERMEDIATE Intermediate     TRIMETH/SULFA >=320 RESISTANT Resistant     AMPICILLIN/SULBACTAM >=32 RESISTANT Resistant     PIP/TAZO 16 SENSITIVE Sensitive     Extended ESBL POSITIVE Resistant     * >=100,000 COLONIES/mL KLEBSIELLA PNEUMONIAE  Culture, blood (routine x 2)     Status: None   Collection Time: 06/25/19 12:32 PM   Specimen: BLOOD LEFT WRIST  Result Value Ref Range Status   Specimen Description BLOOD LEFT WRIST  Final   Special Requests   Final    BOTTLES DRAWN AEROBIC ONLY Blood Culture adequate volume   Culture   Final    NO GROWTH 5 DAYS Performed at [facility], [address] [place], [place] [address]    Report Status 06/30/2019 FINAL  Final  Culture, blood (routine x 2)     Status: None   Collection Time: 06/25/19 12:54 PM   Specimen: BLOOD RIGHT HAND  Result Value Ref Range Status   Specimen Description BLOOD RIGHT HAND  Final   Special Requests   Final    BOTTLES DRAWN AEROBIC AND ANAEROBIC Blood Culture adequate volume   Culture   Final  NO GROWTH 5 DAYS Performed at [facility], [place] [address], [place], [place]  [address]    Report Status 06/30/2019 FINAL  Final  MRSA PCR Screening     Status: None   Collection Time: 06/25/19  3:00 PM   Specimen: Nasal Mucosa; Nasopharyngeal  Result Value Ref Range Status   MRSA by PCR NEGATIVE NEGATIVE Final    Comment:        The GeneXpert MRSA Assay (FDA approved for NASAL specimens only), is one component of a comprehensive MRSA colonization surveillance program. It is not intended to diagnose MRSA infection nor to guide or monitor treatment for MRSA infections. Performed at [facility], [place] [address] [place], [place] [address]   Respiratory Panel by PCR     Status: None   Collection Time: 06/25/19  4:40 PM   Specimen: Nasopharyngeal Swab; Respiratory  Result Value Ref Range Status   Adenovirus NOT DETECTED NOT DETECTED Final   Coronavirus 229E NOT DETECTED NOT DETECTED Final    Comment: (NOTE) The Coronavirus on the Respiratory Panel, DOES NOT test for the novel  Coronavirus (2019 nCoV)    Coronavirus HKU1 NOT DETECTED NOT DETECTED Final   Coronavirus NL63 NOT DETECTED NOT DETECTED Final   Coronavirus OC43 NOT DETECTED NOT DETECTED Final   Metapneumovirus NOT DETECTED NOT DETECTED Final   Rhinovirus / Enterovirus NOT DETECTED NOT DETECTED Final   Influenza A NOT DETECTED NOT DETECTED Final   Influenza B NOT DETECTED NOT DETECTED Final   Parainfluenza Virus 1 NOT DETECTED NOT DETECTED Final   Parainfluenza Virus 2 NOT DETECTED NOT DETECTED Final   Parainfluenza Virus 3 NOT DETECTED NOT DETECTED Final   Parainfluenza Virus 4 NOT DETECTED NOT DETECTED Final   Respiratory Syncytial Virus NOT DETECTED NOT DETECTED Final   Bordetella pertussis NOT DETECTED NOT DETECTED Final   Chlamydophila pneumoniae NOT DETECTED NOT DETECTED Final   Mycoplasma pneumoniae NOT DETECTED NOT DETECTED Final    Comment: Performed at [facility], [place]. [address], [place], [place] [address]  Culture, respiratory (non-expectorated)     Status: None    Collection Time: 06/25/19  4:45 PM   Specimen: Tracheal Aspirate; Respiratory  Result Value Ref Range Status   Specimen Description TRACHEAL ASPIRATE  Final   Special Requests NONE  Final   Gram Stain   Final    FEW WBC PRESENT,BOTH PMN AND MONONUCLEAR NO ORGANISMS SEEN    Culture   Final    Consistent with normal respiratory flora. Performed at [facility], [place] [address], [place], [place] [address]    Report Status 06/29/2019 FINAL  Final         Radiology Studies: No results found.      Scheduled Meds:  AeroChamber Plus Flo-Vu Large  1 each Other Once   amiodarone  100 mg Oral Daily   apixaban  5 mg Oral BID   arformoterol  15 mcg Nebulization BID   budesonide (PULMICORT) nebulizer solution  0.25 mg Nebulization BID   carvedilol  12.5 mg Oral BID WC   Chlorhexidine Gluconate Cloth  6 each Topical Daily   dextromethorphan-guaiFENesin  1 tablet Oral BID   HYDROcodone-acetaminophen  1 tablet Oral Q6H   insulin aspart  0-20 Units Subcutaneous TID WC   insulin aspart  0-5 Units Subcutaneous QHS   insulin aspart  4 Units Subcutaneous TID WC   insulin glargine  25 Units Subcutaneous Daily   ipratropium  2 puff  Inhalation Once   isosorbide mononitrate  30 mg Oral QHS   mouth rinse  15 mL Mouth Rinse BID   multivitamin with minerals  1 tablet Oral Daily   pantoprazole (PROTONIX) IV  40 mg Intravenous QHS   potassium chloride  40 mEq Oral Q4H   predniSONE  40 mg Oral Q breakfast   Ensure Max Protein  11 oz Oral Daily   rosuvastatin  20 mg Oral q1800   topiramate  100 mg Oral QHS   torsemide  80 mg Oral BID   Continuous Infusions:         Aline August, MD [facility] 07/01/2019, 10:39 AM

## 2019-07-01 NOTE — Progress Notes (Signed)
RT placed patient on auto Bi-level hs. 6L O2 bleed in needed. Patient tolerating well.

## 2019-07-02 ENCOUNTER — Other Ambulatory Visit: Payer: Self-pay

## 2019-07-02 LAB — GLUCOSE, CAPILLARY
Glucose-Capillary: 197 mg/dL — ABNORMAL HIGH (ref 70–99)
Glucose-Capillary: 334 mg/dL — ABNORMAL HIGH (ref 70–99)
Glucose-Capillary: 298 mg/dL — ABNORMAL HIGH (ref 70–99)
Glucose-Capillary: 290 mg/dL — ABNORMAL HIGH (ref 70–99)

## 2019-07-02 LAB — CBC WITH DIFFERENTIAL/PLATELET
Abs Immature Granulocytes: 0.09 10*3/uL — ABNORMAL HIGH (ref 0.00–0.07)
Basophils Absolute: 0 10*3/uL (ref 0.0–0.1)
Basophils Relative: 0 %
Eosinophils Absolute: 0.3 10*3/uL (ref 0.0–0.5)
Eosinophils Relative: 2 %
HCT: 45.2 % (ref 39.0–52.0)
Hemoglobin: 13.5 g/dL (ref 13.0–17.0)
Immature Granulocytes: 1 %
Lymphocytes Relative: 13 %
Lymphs Abs: 2 10*3/uL (ref 0.7–4.0)
MCH: 28.4 pg (ref 26.0–34.0)
MCHC: 29.9 g/dL — ABNORMAL LOW (ref 30.0–36.0)
MCV: 95 fL (ref 80.0–100.0)
Monocytes Absolute: 1.6 10*3/uL — ABNORMAL HIGH (ref 0.1–1.0)
Monocytes Relative: 10 %
Neutro Abs: 11.9 10*3/uL — ABNORMAL HIGH (ref 1.7–7.7)
Neutrophils Relative %: 74 %
Platelets: 220 10*3/uL (ref 150–400)
RBC: 4.76 MIL/uL (ref 4.22–5.81)
RDW: 14 % (ref 11.5–15.5)
WBC: 15.9 10*3/uL — ABNORMAL HIGH (ref 4.0–10.5)
nRBC: 0 % (ref 0.0–0.2)

## 2019-07-02 LAB — BASIC METABOLIC PANEL
Anion gap: 15 (ref 5–15)
BUN: 76 mg/dL — ABNORMAL HIGH (ref 6–20)
CO2: 35 mmol/L — ABNORMAL HIGH (ref 22–32)
Calcium: 9 mg/dL (ref 8.9–10.3)
Chloride: 89 mmol/L — ABNORMAL LOW (ref 98–111)
Creatinine, Ser: 2.41 mg/dL — ABNORMAL HIGH (ref 0.61–1.24)
GFR calc Af Amer: 33 mL/min — ABNORMAL LOW (ref >60)
GFR calc non Af Amer: 29 mL/min — ABNORMAL LOW (ref >60)
Glucose, Bld: 206 mg/dL — ABNORMAL HIGH (ref 70–99)
Potassium: 3.2 mmol/L — ABNORMAL LOW (ref 3.5–5.1)
Sodium: 139 mmol/L (ref 135–145)

## 2019-07-02 LAB — MAGNESIUM: Magnesium: 2.1 mg/dL (ref 1.7–2.4)

## 2019-07-02 MED ORDER — POTASSIUM CHLORIDE CRYS ER 20 MEQ PO TBCR
40.0000 meq | EXTENDED_RELEASE_TABLET | ORAL | Status: AC
  Administered 2019-07-02 (×2): 40 meq via ORAL
  Filled 2019-07-02 (×2): qty 2

## 2019-07-02 MED ORDER — PREDNISONE 5 MG PO TABS: 5.0000 mg | ORAL_TABLET | Freq: Every day | ORAL | Status: DC

## 2019-07-02 MED ORDER — PREDNISONE 20 MG PO TABS
30.0000 mg | ORAL_TABLET | Freq: Every day | ORAL | Status: DC
  Filled 2019-07-02: qty 1

## 2019-07-02 MED ORDER — PREDNISONE 20 MG PO TABS: 20.0000 mg | ORAL_TABLET | Freq: Every day | ORAL | Status: DC

## 2019-07-02 MED ORDER — PREDNISONE 10 MG PO TABS: 10.0000 mg | ORAL_TABLET | Freq: Every day | ORAL | Status: DC

## 2019-07-02 NOTE — Progress Notes (Signed)
Physical Therapy Treatment Patient Details Name: [name]. 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 Pt intubated 11/4-11/6.    PT Comments    Pt making steady progress with mobility, tolerating further gait distance with less physical assistance required overall. Pt would continue to benefit from skilled physical therapy services at this time while admitted and after d/c to address the below listed limitations in order to improve overall safety and independence with functional mobility.   Follow Up Recommendations  Home health PT;Supervision for mobility/OOB     Equipment Recommendations  None recommended by PT    Recommendations for Other Services       Precautions / Restrictions Precautions Precautions: Fall;Other (comment) Precaution Comments: Visually impaired in R eye, monitor SPO2, supplemental O2 Restrictions Weight Bearing Restrictions: No    Mobility  Bed Mobility Overal bed mobility: Needs Assistance Bed Mobility: Supine to Sit     Supine to sit: Min guard     General bed mobility comments: increased time and effort, HOB elevated, use of bed rails  Transfers Overall transfer level: Needs assistance Equipment used: 2 person hand held assist;Rolling walker (2 wheeled) Transfers: Sit to/from Stand Sit to Stand: Min assist;+2 physical assistance;+2 safety/equipment         General transfer comment: pt initially requesting 2HHA with min A x2 to power into standing from sitting EOB  Ambulation/Gait Ambulation/Gait assistance: Min guard;+2 safety/equipment Gait Distance (Feet): 15  Feet Assistive device: Rolling walker (2 wheeled) Gait Pattern/deviations: Wide base of support;Shuffle Gait velocity: decreased   General Gait Details: pt with very wide BoS, cueing to maintain proximity to [employer] Mobility    Modified Rankin (Stroke Patients Only)       Balance Overall balance assessment: Needs assistance Sitting-balance support: Feet supported Sitting balance-Leahy Scale: Fair     Standing balance support: Bilateral upper extremity supported Standing balance-Leahy Scale: Poor                              Cognition Arousal/Alertness: Awake/alert Behavior During Therapy: WFL for tasks assessed/performed Overall Cognitive Status: Impaired/Different from baseline Area of Impairment: Safety/judgement;Problem solving                         Safety/Judgement: Decreased awareness of safety;Decreased awareness of deficits   Problem Solving: Difficulty sequencing;Requires verbal cues General Comments: Poor insight and awareness into current status. Pt is very particular.      Exercises      General Comments        Pertinent Vitals/Pain Pain Assessment: No/denies pain    Home Living                      Prior Function            PT Goals (current goals can now be found in the care plan section) Acute Rehab PT Goals PT Goal Formulation: With patient/family Time For Goal Achievement: 07/12/19 Potential to Achieve Goals:  Fair Progress towards PT goals: Progressing toward goals    Frequency    Min 3X/week      PT Plan Current plan remains appropriate    Co-evaluation              AM-PAC PT "6 Clicks" Mobility   Outcome Measure  Help needed turning from your back to your side while in a flat bed without using bedrails?: None Help needed moving from lying on your back to sitting on the side of a flat bed without using bedrails?: A Little Help needed moving to and  from a bed to a chair (including a wheelchair)?: A Little Help needed standing up from a chair using your arms (e.g., wheelchair or bedside chair)?: A Little Help needed to walk in hospital room?: A Little Help needed climbing 3-5 steps with a railing? : A Lot 6 Click Score: 18    End of Session Equipment Utilized During Treatment: Gait belt;Oxygen Activity Tolerance: Patient tolerated treatment well Patient left: with call bell/phone within reach;Other (comment)(seated on toilet to have a BM) Nurse Communication: Mobility status;Other (comment)(nurse tech - pt seated on toilet to have BM) PT Visit Diagnosis: Muscle weakness (generalized) (M62.81);Difficulty in walking, not elsewhere classified (R26.2);Unsteadiness on feet (R26.81)     Time: 2536-6440 PT Time Calculation (min) (ACUTE ONLY): 32 min  Charges:  $Gait Training: 8-22 mins $Therapeutic Activity: 8-22 mins                     Anastasio Champion, DPT  Acute Rehabilitation Services Pager [phone] Office [place] 07/02/2019, 12:49 PM

## 2019-07-02 NOTE — Progress Notes (Signed)
Nutrition Follow-up  DOCUMENTATION CODES:   Obesity unspecified  INTERVENTION:   -Continue MVI with minerals daily -D/c Ensure Max -D/c Magic Cups -Double protein portions with meals -Provided "Carbohydrate Counting for People with Diabetes" handout from [facility] Nutrition Care Manual   NUTRITION DIAGNOSIS:   Increased nutrient needs related to chronic illness as evidenced by estimated needs.  Ongoing  GOAL:   Patient will meet greater than or equal to 90% of their needs  Progressing   MONITOR:   PO intake, Supplement acceptance, Diet advancement, Labs, Weight trends, Skin, I & O's  REASON FOR ASSESSMENT:   Ventilator    ASSESSMENT:   58 yo male admitted with SOB x 3 days requiring intubation on admission. PMH includes CAD, ischemic cardiomyopathy, obesity, CHF, HTN, DM, LE edema, brain tumor s/p resection 2014.  11/6- extubated  Reviewed I/O's: -4.8 L x 24 hours and -28.1 L since admission  UOP: 6 L x 24 hours  Case discussed with OT, who reports concern over tp's elevated CBGS and requesting RD provide written materials to pt and wife. Pt is extremely forgetful and will likely not retain educational information. RD will attach diet education materials for AVS/ discharge summary to reinforcement. Suspect that steroids are likely increasing CBGS.   Pt with good appetite. Noted meal completion 100%. Pt is refusing Ensure Max supplements.   Medications reviewed and include prednisone.   Plan for home with home health services once medically stable.   Labs reviewed: CBGS: 197-334 (inpatient orders for glycemic control are 0-20 units insulin aspart TID with meals, 0-5 units insulin aspart q HS, 8 units insulin aspart TID with meals and 25 units insulin glargine daily).   Diet Order:   Diet Order            Diet regular Room service appropriate? Yes; Fluid consistency: Thin  Diet effective now              EDUCATION NEEDS:   Not appropriate for education at  this time  Skin:  Skin Assessment: Reviewed RN Assessment(MASD to groin)  Last BM:  07/02/19  Height:   Ht Readings from Last 1 Encounters:  06/25/19 5\' 11"  (1.803 m)    Weight:   Wt Readings from Last 1 Encounters:  06/28/19 107.5 kg    Ideal Body Weight:  78.2 kg  BMI:  Body mass index is 33.05 kg/m.  Estimated Nutritional Needs:   Kcal:  2000-2200  Protein:  100-115 grams  Fluid:  > 2 L    Boomer Winders A. Jimmye Norman, RD, LDN, [place] Registered Dietitian II Certified Diabetes Care and Education Specialist Pager: [phone] After hours Pager: [phone]

## 2019-07-02 NOTE — Progress Notes (Signed)
  Speech Language Pathology Treatment: Dysphagia  Patient Details Name: [name]. MRN: [id number] DOB: [birth year] Today's Date: 07/02/2019 Time: 2671-2458 SLP Time Calculation (min) (ACUTE ONLY): 19 min  Assessment / Plan / Recommendation Clinical Impression  Pt is at reduced risk of aspiration when consuming regular diet with thin liquids via straw. 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 Information provided to pt's nurse, ST to sign off.    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 Pt intubated 11/4-11/6.       SLP Plan  All goals met       Recommendations  Diet recommendations: Regular;Thin liquid Liquids provided via: Cup;Straw Medication Administration: Whole meds with liquid Supervision: Patient able to self feed;Staff to assist with self feeding Compensations: Slow rate;Small sips/bites;Follow solids with liquid Postural Changes and/or Swallow Maneuvers: Seated upright 90 degrees                Oral Care Recommendations: Oral care BID Follow up Recommendations: None SLP Visit Diagnosis: Dysphagia, unspecified (R13.10) Plan: All goals met       GO                Yvan Dority 07/02/2019, 3:10 PM

## 2019-07-02 NOTE — Patient Outreach (Signed)
  [place] [facility]) Care Management Chronic Special Needs Program   07/02/2019  Name: [name]., DOB: [birth year]  MRN: [id number]  The client was discussed in today's interdisciplinary care team meeting.  The following issues were discussed:  Client's needs, Changes in health status, Care Plan, Coordination of care, Care transitions and Issues/barriers to care  Participants present:   Mahlon Gammon, MSN, RN,CCM, CNE                 Thea Silversmith, MSN, RN, CCM                     Melissa Sandlin RN,BSN,CCM, CDE  Kelli Churn, RN, CCM, CDE Quinn Plowman RN, BSN, CCM            Marco Collie, MD Maryella Shivers, MD            Coralie Carpen, MD [facility] Team: Babs Bertin, RN; Teresa Pelton, RN, BSN  Pending discussion with advanced heart failure clinic social work regarding para-medicine program.  Plan: RNCM will continue to follow; assist with discharge planning as needed; [facility] to attempt engagement post discharge; Utilization management team to complete transition of care post discharge.   Thea Silversmith, RN, MSN, [place] [phone]

## 2019-07-02 NOTE — Progress Notes (Signed)
PROGRESS NOTE    [name].  QVZ:[id number] DOB: November 07, 1960 DOA: 06/25/2019 PCP: Caren Macadam, MD    Brief Narrative:   [name] is a 58 year old male with history of COPD, chronic hypoxic/hypercapnic respiratory failure on 2 to 3 L nasal cannula at home but had been switched to trilogy vent at home after recent hospitalization and discharge home, non adherent to CPAP, OSA, chronic renal disease stage III-IV, chronic diastolic heart failure, multiple recent hospitalizations with worsening respiratory failure requiring intubation presented with worsening shortness of breath on 06/25/2019.  He was found to be hypoxic/hypercapnic and failed NIV therapy and required intubation.  He was admitted under CCM service and also required vasopressors.  COVID-19 testing was negative.  Initially started on broad-spectrum antibiotics.  He was also treated with intravenous Lasix and diuresed well.  He was extubated on 06/27/2019 and transferred to [facility] service on 06/29/2019.  Palliative care was also consulted.   Assessment & Plan:   Active Problems:   Acute on chronic respiratory failure with hypoxia and hypercapnia (HCC)   Acute pulmonary edema (HCC)   Acute on chronic hypoxic and hypercarbic respiratory failure COPD exacerbation Patient presented to ED with shortness of breath found to be hypoxic requiring intubation.  Was successfully extubated on 06/27/2019 and transferred to the hospital service on 06/29/2019.  Covid-19/SARS-CoV-2 negative.  Patient received 3-day course of cefepime/azithromycin and 1 dose of vancomycin on admission. --IV steroids transition to prednisone 40 mg p.o. daily, will continue slow taper --Continue Pulmicort and Brovana nebs twice daily --Duo nebs every 4 hours as needed --Continue supplemental oxygen, titrate for SPO2 greater than 88% --Encouraged continued compliance with CPAP/BiPAP or home trilogy  Acute on chronic diastolic heart failure  Limited echo during this hospitalization was poor study.  Limited echo on 02/22/2019 showed EF of 55 to 60%.  Aggressively diuresed with IV diuretics during initial hospitalization. --net negative 28.7 liters since admission --Continue carvedilol --Continue torsemide 80 mg p.o. daily --Imdur --Continue to monitor strict I's and O's and daily weights; fluid restriction  Chronic kidney disease stage IV Baseline creatinine 2.3-2.7.  Not a candidate for dialysis given his poor medical compliance. --Creatinine improved to 2.41 today; creatinine peaked at 2.89 during admission --Continue diuresis as above --Monitor renal function daily  Anemia of chronic disease --Etiology secondary to chronic kidney disease.  Hemoglobin stable  Hypokalemia --Replace.  Repeat a.m. labs  OSA/OHS, noncompliance to CPAP Patient states inability to tolerate trilogy mask at home, compliant overnight. --Continue CPAP while inpatient  Chronic paroxysmal atrial fibrillation  Rate controlled --Continue Eliquis and amiodarone.    Diabetes mellitus type 2 uncontrolled with hyperglycemia.  --Continue Lantus and CBGs with SSI.  Generalized deconditioning Noncompliance Overall prognosis is very poor.  Patient is noncompliant to medications as well as CPAP.  Patient has had recurrent recent hospitalization and multiple intubations.  This time, he apparently has decided that he would not want any more intubations. His overall insight is very poor.  He probably does not understand the gravity of the situation and keeps mentioning that he does not want to go to a rehab facility and wants to go home where his wife can take care of him. --Recommend residential hospice; but declines --Palliative care following; appreciate assistance  Obesity BMI 33.05, discussed with patient need for aggressive lifestyle changes and weight loss as this complicates all facets of care.   DVT prophylaxis: Heparin Code Status: DNR  Family Communication: No family present at bedside Disposition Plan:  Continue inpatient, likely candidate for residential versus home hospice but declines, declines SNF placement, likely discharge home with home health services in 1-2 days.   Consultants:   Palliative care  PCCM  Procedures:   Intubation  Transthoracic echocardiogram IMPRESSIONS Very poor study. Cannot comment on valve structure. Cannot accurately comment on LVF or wall motion wven with definiy contrast as imgas are very poor in quality.  Antimicrobials:   Vancomycin 11/4 - 11/5  Azithromycin 11/4 - 11/7  Cefepime 11/4 - 11/7    Subjective: Patient seen and examined bedside, resting comfortably.  Requesting removal of Foley catheter.  Discussed with patient residential versus home hospice versus SNF, declines and wants her just returned home with home health services.  Discussed with him regarding his recurrent hospitalizations and his noncompliance with medical therapy.  No other complaints or concerns at this time.  Denies headache, no fever/chills/night sweats, no chest pain, no palpitations, no worse shortness of breath than his normal baseline, no abdominal pain, no cough/congestion.  No acute events overnight per nursing staff.  Objective: Vitals:   07/01/19 2126 07/02/19 0633 07/02/19 0844 07/02/19 1559  BP: (!) 129/93 120/67  (!) 123/91  Pulse: 72 76 74 64  Resp:  18 18 19   Temp: 97.7 F (36.5 C) 98.4 F (36.9 C)  98.5 F (36.9 C)  TempSrc: Oral Oral  Oral  SpO2: 99% 100% 98% 99%  Weight:      Height:        Intake/Output Summary (Last 24 hours) at 07/02/2019 1825 Last data filed at 07/02/2019 1624 Gross per 24 hour  Intake 1878 ml  Output 7275 ml  Net -5397 ml   Filed Weights   06/25/19 0819 06/28/19 0340  Weight: 105 kg 107.5 kg    Examination:  General exam: Appears calm and comfortable, chronically ill appearance, appears older than stated age, obese Respiratory system:  Decreased breath sounds bilateral bases, with mild crackles, normal respiratory effort, on 4 L nasal cannula oxygenating 100%. Cardiovascular system: S1 & S2 heard, RRR. No JVD, murmurs, rubs, gallops or clicks.  Trace to 1+ pedal edema bilateral lower extremities to mid shin. Gastrointestinal system: Abdomen is nondistended, protuberant, soft and nontender. No organomegaly or masses felt. Normal bowel sounds heard. Central nervous system: Alert and oriented. No focal neurological deficits. Extremities: Symmetric 5 x 5 power. Skin: No rashes, lesions or ulcers Psychiatry: Judgement and insight appear extremely poor. Mood & affect appropriate.     Data Reviewed: I have personally reviewed following labs and imaging studies  CBC: Recent Labs  Lab 06/26/19 0514 06/27/19 0439 06/27/19 0501 06/28/19 0258 06/30/19 0417 07/02/19 0353  WBC 10.9* 11.5*  --  11.3* 11.7* 15.9*  NEUTROABS  --   --   --   --  7.5 11.9*  HGB 11.0* 10.6* 11.6* 11.0* 12.2* 13.5  HCT 38.3* 36.4* 34.0* 38.0* 41.0 45.2  MCV 99.2 96.6  --  97.9 95.6 95.0  PLT 215 195  --  202 181 536   Basic Metabolic Panel: Recent Labs  Lab 06/27/19 0439 06/27/19 0501 06/28/19 0258 06/30/19 0417 07/01/19 0236 07/02/19 0353  NA 146* 145 149* 138 137 139  K 3.6 3.5 3.4* 3.3* 3.0* 3.2*  CL 103  --  101 90* 88* 89*  CO2 29  --  33* 32 34* 35*  GLUCOSE 292*  --  213* 204* 184* 206*  BUN 71*  --  83* 89* 83* 76*  CREATININE 2.89*  --  2.81* 2.69*  2.59* 2.41*  CALCIUM 9.6  --  8.8* 8.8* 9.1 9.0  MG 2.4  --  2.6* 2.3 2.1 2.1  PHOS 2.5  --  4.4  --   --   --    GFR: Estimated Creatinine Clearance: 41.7 mL/min (A) (by C-G formula based on SCr of 2.41 mg/dL (H)). Liver Function Tests: No results for input(s): AST, ALT, ALKPHOS, BILITOT, PROT, ALBUMIN in the last 168 hours. No results for input(s): LIPASE, AMYLASE in the last 168 hours. No results for input(s): AMMONIA in the last 168 hours. Coagulation Profile: No results  for input(s): INR, PROTIME in the last 168 hours. Cardiac Enzymes: No results for input(s): CKTOTAL, CKMB, CKMBINDEX, TROPONINI in the last 168 hours. BNP (last 3 results) No results for input(s): PROBNP in the last 8760 hours. HbA1C: No results for input(s): HGBA1C in the last 72 hours. CBG: Recent Labs  Lab 07/01/19 1714 07/01/19 2118 07/02/19 0756 07/02/19 1144 07/02/19 1745  GLUCAP 291* 259* 197* 334* 298*   Lipid Profile: No results for input(s): CHOL, HDL, LDLCALC, TRIG, CHOLHDL, LDLDIRECT in the last 72 hours. Thyroid Function Tests: No results for input(s): TSH, T4TOTAL, FREET4, T3FREE, THYROIDAB in the last 72 hours. Anemia Panel: No results for input(s): VITAMINB12, FOLATE, FERRITIN, TIBC, IRON, RETICCTPCT in the last 72 hours. Sepsis Labs: Recent Labs  Lab 06/25/19 1941 06/26/19 0821  LATICACIDVEN 3.1* 1.8    Recent Results (from the past 240 hour(s))  SARS Coronavirus 2 by RT PCR (hospital order, performed in [facility] hospital lab) Nasopharyngeal Nasopharyngeal Swab     Status: None   Collection Time: 06/25/19  8:45 AM   Specimen: Nasopharyngeal Swab  Result Value Ref Range Status   SARS Coronavirus 2 NEGATIVE NEGATIVE Final    Comment: (NOTE) If result is NEGATIVE SARS-CoV-2 target nucleic acids are NOT DETECTED. The SARS-CoV-2 RNA is generally detectable in upper and lower  respiratory specimens during the acute phase of infection. The lowest  concentration of SARS-CoV-2 viral copies this assay can detect is 250  copies / mL. A negative result does not preclude SARS-CoV-2 infection  and should not be used as the sole basis for treatment or other  patient management decisions.  A negative result may occur with  improper specimen collection / handling, submission of specimen other  than nasopharyngeal swab, presence of viral mutation(s) within the  areas targeted by this assay, and inadequate number of viral copies  (<250 copies / mL). A negative result  must be combined with clinical  observations, patient history, and epidemiological information. If result is POSITIVE SARS-CoV-2 target nucleic acids are DETECTED. The SARS-CoV-2 RNA is generally detectable in upper and lower  respiratory specimens dur ing the acute phase of infection.  Positive  results are indicative of active infection with SARS-CoV-2.  Clinical  correlation with patient history and other diagnostic information is  necessary to determine patient infection status.  Positive results do  not rule out bacterial infection or co-infection with other viruses. If result is PRESUMPTIVE POSTIVE SARS-CoV-2 nucleic acids MAY BE PRESENT.   A presumptive positive result was obtained on the submitted specimen  and confirmed on repeat testing.  While 2019 novel coronavirus  (SARS-CoV-2) nucleic acids may be present in the submitted sample  additional confirmatory testing may be necessary for epidemiological  and / or clinical management purposes  to differentiate between  SARS-CoV-2 and other Sarbecovirus currently known to infect humans.  If clinically indicated additional testing with an alternate test  methodology [phone]) is advised. The SARS-CoV-2 RNA is generally  detectable in upper and lower respiratory sp ecimens during the acute  phase of infection. The expected result is Negative. Fact Sheet for Patients:  StrictlyIdeas.no Fact Sheet for Healthcare Providers: BankingDealers.co.za This test is not yet approved or cleared by the [place] FDA and has been authorized for detection and/or diagnosis of SARS-CoV-2 by FDA under an Emergency Use Authorization (EUA).  This EUA will remain in effect (meaning this test can be used) for the duration of the COVID-19 declaration under Section 564(b)(1) of the Act, 21 U.S.C. section 360bbb-3(b)(1), unless the authorization is terminated or revoked sooner. Performed at [facility], [place] [address], [place], [place] [address]   Urine culture     Status: Abnormal   Collection Time: 06/25/19 12:16 PM   Specimen: Urine, Clean Catch  Result Value Ref Range Status   Specimen Description URINE, CLEAN CATCH  Final   Special Requests   Final    NONE Performed at [facility], [place] [address], [place], [place] [address]    Culture (A)  Final    >=100,000 COLONIES/mL KLEBSIELLA PNEUMONIAE Confirmed Extended Spectrum Beta-Lactamase Producer (ESBL).  In bloodstream infections from ESBL organisms, carbapenems are preferred over piperacillin/tazobactam. They are shown to have a lower risk of mortality.    Report Status 06/27/2019 FINAL  Final   Organism ID, Bacteria KLEBSIELLA PNEUMONIAE (A)  Final      Susceptibility   Klebsiella pneumoniae - MIC*    AMPICILLIN >=32 RESISTANT Resistant     CEFAZOLIN >=64 RESISTANT Resistant     CEFTRIAXONE >=64 RESISTANT Resistant     CIPROFLOXACIN 1 SENSITIVE Sensitive     GENTAMICIN <=1 SENSITIVE Sensitive     IMIPENEM <=0.25 SENSITIVE Sensitive     NITROFURANTOIN 64 INTERMEDIATE Intermediate     TRIMETH/SULFA >=320 RESISTANT Resistant     AMPICILLIN/SULBACTAM >=32 RESISTANT Resistant     PIP/TAZO 16 SENSITIVE Sensitive     Extended ESBL POSITIVE Resistant     * >=100,000 COLONIES/mL KLEBSIELLA PNEUMONIAE  Culture, blood (routine x 2)     Status: None   Collection Time: 06/25/19 12:32 PM   Specimen: BLOOD LEFT WRIST  Result Value Ref Range Status   Specimen Description BLOOD LEFT WRIST  Final   Special Requests   Final    BOTTLES DRAWN AEROBIC ONLY Blood Culture adequate volume   Culture   Final    NO GROWTH 5 DAYS Performed at [facility], [address] [place], [place] [address]    Report Status 06/30/2019 FINAL  Final  Culture, blood (routine x 2)     Status: None   Collection Time: 06/25/19 12:54 PM   Specimen: BLOOD RIGHT HAND  Result Value Ref Range Status   Specimen Description BLOOD RIGHT HAND   Final   Special Requests   Final    BOTTLES DRAWN AEROBIC AND ANAEROBIC Blood Culture adequate volume   Culture   Final    NO GROWTH 5 DAYS Performed at [facility], [place] [address], [place], [place] [address]    Report Status 06/30/2019 FINAL  Final  MRSA PCR Screening     Status: None   Collection Time: 06/25/19  3:00 PM   Specimen: Nasal Mucosa; Nasopharyngeal  Result Value Ref Range Status   MRSA by PCR NEGATIVE NEGATIVE Final    Comment:        The GeneXpert MRSA Assay (FDA approved for  NASAL specimens only), is one component of a comprehensive MRSA colonization surveillance program. It is not intended to diagnose MRSA infection nor to guide or monitor treatment for MRSA infections. Performed at [facility], [place] [address], [place], [place] [address]   Respiratory Panel by PCR     Status: None   Collection Time: 06/25/19  4:40 PM   Specimen: Nasopharyngeal Swab; Respiratory  Result Value Ref Range Status   Adenovirus NOT DETECTED NOT DETECTED Final   Coronavirus 229E NOT DETECTED NOT DETECTED Final    Comment: (NOTE) The Coronavirus on the Respiratory Panel, DOES NOT test for the novel  Coronavirus (2019 nCoV)    Coronavirus HKU1 NOT DETECTED NOT DETECTED Final   Coronavirus NL63 NOT DETECTED NOT DETECTED Final   Coronavirus OC43 NOT DETECTED NOT DETECTED Final   Metapneumovirus NOT DETECTED NOT DETECTED Final   Rhinovirus / Enterovirus NOT DETECTED NOT DETECTED Final   Influenza A NOT DETECTED NOT DETECTED Final   Influenza B NOT DETECTED NOT DETECTED Final   Parainfluenza Virus 1 NOT DETECTED NOT DETECTED Final   Parainfluenza Virus 2 NOT DETECTED NOT DETECTED Final   Parainfluenza Virus 3 NOT DETECTED NOT DETECTED Final   Parainfluenza Virus 4 NOT DETECTED NOT DETECTED Final   Respiratory Syncytial Virus NOT DETECTED NOT DETECTED Final   Bordetella pertussis NOT DETECTED NOT DETECTED Final   Chlamydophila pneumoniae NOT DETECTED NOT DETECTED Final    Mycoplasma pneumoniae NOT DETECTED NOT DETECTED Final    Comment: Performed at [facility], [place]. [address], [place], [place] [address]  Culture, respiratory (non-expectorated)     Status: None   Collection Time: 06/25/19  4:45 PM   Specimen: Tracheal Aspirate; Respiratory  Result Value Ref Range Status   Specimen Description TRACHEAL ASPIRATE  Final   Special Requests NONE  Final   Gram Stain   Final    FEW WBC PRESENT,BOTH PMN AND MONONUCLEAR NO ORGANISMS SEEN    Culture   Final    Consistent with normal respiratory flora. Performed at [facility], [place] [address] [place], [place] [address]    Report Status 06/29/2019 FINAL  Final         Radiology Studies: No results found.      Scheduled Meds: . AeroChamber Plus Flo-Vu Large  1 each Other Once  . amiodarone  100 mg Oral Daily  . apixaban  5 mg Oral BID  . arformoterol  15 mcg Nebulization BID  . budesonide (PULMICORT) nebulizer solution  0.25 mg Nebulization BID  . carvedilol  12.5 mg Oral BID WC  . Chlorhexidine Gluconate Cloth  6 each Topical Daily  . dextromethorphan-guaiFENesin  1 tablet Oral BID  . HYDROcodone-acetaminophen  1 tablet Oral Q6H  . insulin aspart  0-20 Units Subcutaneous TID WC  . insulin aspart  0-5 Units Subcutaneous QHS  . insulin aspart  8 Units Subcutaneous TID WC  . insulin glargine  25 Units Subcutaneous Daily  . ipratropium  2 puff Inhalation Once  . isosorbide mononitrate  30 mg Oral QHS  . mouth rinse  15 mL Mouth Rinse BID  . multivitamin with minerals  1 tablet Oral Daily  . pantoprazole  40 mg Oral QHS  . predniSONE  40 mg Oral Q breakfast  . Ensure Max Protein  11 oz Oral Daily  . rosuvastatin  20 mg Oral q1800  . topiramate  100 mg Oral QHS  . torsemide  80 mg Oral BID  Continuous Infusions:   LOS: 7 days    Time spent: 39 minutes spent on chart review, discussion with nursing staff, consultants, updating family and interview/physical exam; more than 50%  of that time was spent in counseling and/or coordination of care.    Lilyian Quayle J [place] DO [facility] 07/02/2019, 6:25 PM

## 2019-07-02 NOTE — Progress Notes (Signed)
Palliative Medicine RN Note: Discussed possible d/c support w Dr Hilma Favors. Spoke with Eliezer Lofts, SW with Paramedicine program through HF clinic. She reports pt does see Dr Aundra Dubin and would be a possible candidate for that program. She will review chart and plan to see pt tomorrow.  Marjie Skiff Hannahgrace Lalli, RN, BSN, [facility] Palliative Medicine Team 07/02/2019 1:33 PM Office [phone]

## 2019-07-02 NOTE — Discharge Instructions (Addendum)
Fluid Restriction With some health conditions, you must restrict your fluid intake. This means that you need to limit the amount of fluid that you drink each day (fluid restriction). When you have a fluid restriction, you must carefully measure and keep track of the amount of fluid that you drink. Your health care provider will identify the specific amount of fluid you are allowed each day (fluid allowance). This amount may depend on several things, such as:  How well your kidneys function.  How much fluid you are keeping (retaining) in your body tissues.  Your blood pressure.  Your heart function.  Your blood sodium level. What is my plan? Your health care provider recommends that you limit your fluid intake to 1.8 Liters per day. What counts toward my fluid intake? Your fluid intake includes all liquids that you drink, as well as any foods that become liquid at room temperature. The following are examples of some fluids that you will have to restrict:  Tea, coffee, soda, lemonade, milk, water, juice, sports drinks, and nutritional supplement beverages.  Alcoholic beverages.  Cream.  Gravy.  Ice cubes.  Soup and broth. The following are examples of foods that become liquid at room temperature. These foods will also count toward your fluid intake.  Ice cream and ice milk.  Frozen yogurt and sherbet.  Frozen ice pops.  Flavored gelatin. How do I keep track of my fluid intake? Each morning, fill a jug with the amount of water that is equal to your daily fluid allowance. You can use this water as a guideline for fluid allowance. Each time you take in any form of fluid (including ice cubes and foods that become liquid at room temperature), pour an equal amount of water out of the container. This helps you to see how much fluid you are taking in. It also helps you to see how much more fluid you can take in during the rest of the day. The following conversions may also be helpful in  measuring your fluid intake:  1 cup equals 8 oz (240 mL).   cup equals 6 oz (180 mL).  ? cup equals 5? oz (160 mL).   cup equals 4 oz (120 mL).  ? cup equals 2? oz (80 mL).   cup equals 2 oz (60 mL).  2 Tbsp equals 1 oz (30 mL). What are tips for following this plan? General instructions  Make sure that you stay within your recommended fluid allowance each day. Always measure and keep track of your fluids (including ice cubes and foods that become liquid at room temperature).  Use small cups and glasses and learn to sip fluids slowly.  Try frozen fruits between meals, such as grapes or strawberries. These can satisfy thirst without adding to your fluid intake.  Swallow your pills along with meals or soft foods such as applesauce or mashed potatoes, instead of with liquids. Doing this helps you to save your fluid allowance for something that you enjoy. Weigh yourself each day     Weigh yourself every day. Keeping track of your daily weight can help you and your health care provider to notice as soon as possible if you are retaining too much fluid in your body.  Follow this sequence every morning: 1. Urinate. 2. Weigh yourself. 3. Eat breakfast.  Wear the same amount of clothing each time you weigh yourself.  Write down your daily weight. Give this weight record to your health care provider. If your weight is going  up, you may be retaining too much fluid. Every 1 lb (0.45 kg) of body weight that you gain is a sign that your body is retaining 2 cups (480 mL) of fluid.  Manage your thirst  Add lemon juice or a slice of fresh lemon to water or ice. Doing this helps to satisfy your thirst.  Freeze fruit juice or water in an ice cube tray. Use this as part of your fluid allowance. These cubes are useful for quenching your thirst. Before you freeze the juice or water, measure how much liquid you use to fill a cube section of the ice tray. Subtract this amount from your day's  allowance each time you consume a frozen cube.  Avoid salty (high-sodium) foods. These foods make you thirsty and make it more difficult to stay within your daily fluid allowance.  Keep the temperature in your home at a cooler level.  Keep the air in your home as humid as possible. Dry air increases thirst.  Avoid being out in the hot sun, which can cause you to sweat and become thirsty.  To help avoid dry mouth, brush your teeth often or rinse out your mouth with mouthwash. Lemon wedges, hard sour candies, chewing gum, or breath spray may also help to moisten your mouth. What are some signs that I may be taking in too much fluid? You may be taking in too much fluid if:  Your weight increases. Contact your health care provider if you gain weight rapidly.  Your face, hands, legs, feet, and abdomen start to swell.  You have trouble breathing. Summary  With some health conditions, you must limit (restrict) your fluid intake. This means that you need to limit the amount of fluid you drink each day (fluid restriction). Your health care provider will identify the specific amount of fluid that you are allowed each day.  When you have a fluid restriction, you must carefully measure and keep track of the amount of fluid that you drink.  Your fluid intake includes all liquids that you drink, as well as any foods that become liquid at room temperature (such as ice cream and gelatin).  You may be taking in too much fluid if your weight increases, your body starts to swell, or you have trouble breathing. This information is not intended to replace advice given to you by your health care provider. Make sure you discuss any questions you have with your health care provider. Document Released: 06/04/2007 Document Revised: 11/28/2018 Document Reviewed: 04/11/2017 Elsevier Patient Education  [place]. Low-Sodium Eating Plan Sodium, which is an element that makes up salt, helps you maintain a  healthy balance of fluids in your body. Too much sodium can increase your blood pressure and cause fluid and waste to be held in your body. Your health care provider or dietitian may recommend following this plan if you have high blood pressure (hypertension), kidney disease, liver disease, or heart failure. Eating less sodium can help lower your blood pressure, reduce swelling, and protect your heart, liver, and kidneys. What are tips for following this plan? General guidelines  Most people on this plan should limit their sodium intake to 1,500-2,000 mg (milligrams) of sodium each day. Reading food labels   The Nutrition Facts label lists the amount of sodium in one serving of the food. If you eat more than one serving, you must multiply the listed amount of sodium by the number of servings.  Choose foods with less than 140 mg of  sodium per serving.  Avoid foods with 300 mg of sodium or more per serving. Shopping  Look for lower-sodium products, often labeled as "low-sodium" or "no salt added."  Always check the sodium content even if foods are labeled as "unsalted" or "no salt added".  Buy fresh foods. ? Avoid canned foods and premade or frozen meals. ? Avoid canned, cured, or processed meats  Buy breads that have less than 80 mg of sodium per slice. Cooking  Eat more home-cooked food and less restaurant, buffet, and fast food.  Avoid adding salt when cooking. Use salt-free seasonings or herbs instead of table salt or sea salt. Check with your health care provider or pharmacist before using salt substitutes.  Cook with plant-based oils, such as canola, sunflower, or olive oil. Meal planning  When eating at a restaurant, ask that your food be prepared with less salt or no salt, if possible.  Avoid foods that contain MSG (monosodium glutamate). MSG is sometimes added to [place] food, bouillon, and some canned foods. What foods are recommended? The items listed may not be a  complete list. Talk with your dietitian about what dietary choices are best for you. Grains Low-sodium cereals, including oats, puffed wheat and rice, and shredded wheat. Low-sodium crackers. Unsalted rice. Unsalted pasta. Low-sodium bread. Whole-grain breads and whole-grain pasta. Vegetables Fresh or frozen vegetables. "No salt added" canned vegetables. "No salt added" tomato sauce and paste. Low-sodium or reduced-sodium tomato and vegetable juice. Fruits Fresh, frozen, or canned fruit. Fruit juice. Meats and other protein foods Fresh or frozen (no salt added) meat, poultry, seafood, and fish. Low-sodium canned tuna and salmon. Unsalted nuts. Dried peas, beans, and lentils without added salt. Unsalted canned beans. Eggs. Unsalted nut butters. Dairy Milk. Soy milk. Cheese that is naturally low in sodium, such as ricotta cheese, fresh mozzarella, or Swiss cheese Low-sodium or reduced-sodium cheese. Cream cheese. Yogurt. Fats and oils Unsalted butter. Unsalted margarine with no trans fat. Vegetable oils such as canola or olive oils. Seasonings and other foods Fresh and dried herbs and spices. Salt-free seasonings. Low-sodium mustard and ketchup. Sodium-free salad dressing. Sodium-free light mayonnaise. Fresh or refrigerated horseradish. Lemon juice. Vinegar. Homemade, reduced-sodium, or low-sodium soups. Unsalted popcorn and pretzels. Low-salt or salt-free chips. What foods are not recommended? The items listed may not be a complete list. Talk with your dietitian about what dietary choices are best for you. Grains Instant hot cereals. Bread stuffing, pancake, and biscuit mixes. Croutons. Seasoned rice or pasta mixes. Noodle soup cups. Boxed or frozen macaroni and cheese. Regular salted crackers. Self-rising flour. Vegetables Sauerkraut, pickled vegetables, and relishes. Olives. [place] fries. Onion rings. Regular canned vegetables (not low-sodium or reduced-sodium). Regular canned tomato sauce and  paste (not low-sodium or reduced-sodium). Regular tomato and vegetable juice (not low-sodium or reduced-sodium). Frozen vegetables in sauces. Meats and other protein foods Meat or fish that is salted, canned, smoked, spiced, or pickled. Bacon, ham, sausage, hotdogs, corned beef, chipped beef, packaged lunch meats, salt pork, jerky, pickled herring, anchovies, regular canned tuna, sardines, salted nuts. Dairy Processed cheese and cheese spreads. Cheese curds. Blue cheese. Feta cheese. String cheese. Regular cottage cheese. Buttermilk. Canned milk. Fats and oils Salted butter. Regular margarine. Ghee. Bacon fat. Seasonings and other foods Onion salt, garlic salt, seasoned salt, table salt, and sea salt. Canned and packaged gravies. Worcestershire sauce. Tartar sauce. Barbecue sauce. Teriyaki sauce. Soy sauce, including reduced-sodium. Steak sauce. Fish sauce. Oyster sauce. Cocktail sauce. Horseradish that you find on the shelf. Regular ketchup  and mustard. Meat flavorings and tenderizers. Bouillon cubes. Hot sauce and Tabasco sauce. Premade or packaged marinades. Premade or packaged taco seasonings. Relishes. Regular salad dressings. Salsa. Potato and tortilla chips. Corn chips and puffs. Salted popcorn and pretzels. Canned or dried soups. Pizza. Frozen entrees and pot pies. Summary  Eating less sodium can help lower your blood pressure, reduce swelling, and protect your heart, liver, and kidneys.  Most people on this plan should limit their sodium intake to 1,500-2,000 mg (milligrams) of sodium each day.  Canned, boxed, and frozen foods are high in sodium. Restaurant foods, fast foods, and pizza are also very high in sodium. You also get sodium by adding salt to food.  Try to cook at home, eat more fresh fruits and vegetables, and eat less fast food, canned, processed, or prepared foods. This information is not intended to replace advice given to you by your health care provider. Make sure you  discuss any questions you have with your health care provider. Document Released: 01/27/2002 Document Revised: 2020/06/517 Document Reviewed: 07/31/2016 Elsevier Patient Education  2020 [place]. Preventing Heart Failure Heart failure is a condition in which the heart has trouble pumping blood. This may mean that the heart cannot pump enough blood out to the body, or that the heart does not fill up with enough blood. Either of those problems can lead to symptoms such as fatigue, trouble breathing, and swelling throughout the body. This is a common medical condition that affects not only the heart, but the entire body. Making certain nutrition and lifestyle changes can help you prevent heart failure and avoid serious health problems. What nutrition changes can be made?   If you are overweight or obese, reduce how many calories you eat each day so that you lose weight. Work with your health care provider or a diet and nutrition specialist (dietitian) to determine how many calories you need each day.  Eat foods that are low in salt (sodium). Avoid adding extra salt to foods.  Eat a well-balanced diet that includes a lot of: ? Fresh fruits and vegetables. ? Whole grains. ? Lean meats. ? Beans. ? Fat-free or low-fat dairy products.  Avoid foods that contain a lot of: ? Trans fats. ? Saturated fats. ? Sugar. ? Cholesterol. What lifestyle changes can be made?   Do not use any products that contain nicotine or tobacco, such as cigarettes and e-cigarettes. If you need help quitting or reducing how much you smoke, ask your health care provider.  Stop using alcohol, or limit alcohol intake to no more than 1 drink a day for nonpregnant women and 2 drinks a day for men. One drink equals 12 oz of beer, 5 oz of wine, or 1 oz of hard liquor.  Exercise for at least 150 minutes each week, or as much as told by your health care provider. ? Do moderate-intensity exercise, such as brisk walking,  bicycling, or water aerobics. ? Ask your health care provider which activities are safe for you.  See a health care provider regularly for screening and wellness checks. Know your heart health indicators, such as: ? Blood pressure. ? Cholesterol levels. ? Blood sugar (glucose) levels. ? Weight and BMI.  If you have diabetes, manage your condition and follow your treatment plan as instructed.  Try to get 7-9 hours of sleep each night. To help with sleep: ? Keep your bedroom cool and dark. ? Do not eat a heavy meal during the hour before you go to  bed. ? Do not drink alcohol or caffeinated drinks before bed. ? Avoid screen time before bedtime. This means avoiding television, computers, tablets, and cell phones.  Find ways to relax and manage stress. These may include: ? Breathing exercises. ? Meditation. ? Yoga. ? Listening to music. Why are these changes important?  A well-balanced diet with the appropriate amount of calories can keep your body weight at a healthy level, which reduces strain on your heart.  A low-sodium diet can help keep your blood pressure in a normal range and keep your blood vessels working properly.  Quitting smoking and limiting alcohol intake can reduce harmful effects that these substances have on your heart and blood vessels.  Regular exercise can keep your heart strong so it can pump blood normally.  Managing diabetes helps your blood circulate and can help you maintain a healthy weight.  Managing stress helps to reduce the risk of high blood pressure and heart problems. What can happen if changes are not made? Heart failure can cause very serious problems that may get worse over time, such as:  Extreme fatigue during normal physical activities.  Shortness of breath or trouble breathing.  Swelling in your abdomen, legs, ankles, feet, or neck.  Fluid buildup throughout the body.  Weight gain.  Cough.  Frequent urination. What can I do to  lower my risk? You may be able to lower your risk of heart failure by:  Losing weight or keeping your weight under control.  Working with your health care provider to manage your: ? Cholesterol. ? Blood pressure. ? Diabetes, if this applies.  Eating a healthy diet.  Exercising regularly.  Avoiding unhealthy habits, such as smoking, drinking, or using drugs.  Getting plenty of sleep.  Managing your stress. How is this treated? Heart failure cannot be cured except by heart transplant, but treatment can help to improve your quality of life. Treatment may include:  Medicines to help: ? Lower blood pressure. ? Remove excess sodium from your body. ? Relax blood vessels. ? Improve heart function. ? Control other symptoms of heart failure.  Surgery to open blocked coronary arteries or repair damaged heart valves.  Implantation of a biventricular pacemaker to improve heart muscle function (cardiac resynchronization therapy). This device paces both the right ventricle and left ventricle.  Implantation of a device to treat serious abnormal heart rhythms (implantable cardioverter defibrillator, ICD).  Implantation of a mechanical heart pump to improve the pumping ability of your heart (left ventricular assist device, LVAD).  Heart transplant. This treatment is considered for certain people who do not improve with other treatments. Where to find more information  National Heart, Lung, and Blood Institute: ClickDebate.gl  Centers for Disease Control and Prevention: LawyerNetworking.com.cy  [facility]: https://www.[place]-brown.info/  American Heart Association: ReligiousCamps.at.jsp Contact a health care provider if:  You have rapid weight gain.  You have increasing shortness of breath that is unusual  for you.  You tire easily, or you are unable to participate in your usual activities.  You cough more than normal, especially with physical activity.  You have any swelling or more swelling in areas such as your hands, feet, ankles, or abdomen. Summary  Heart failure can be prevented by making changes to your diet and your lifestyle.  It is important to eat a healthy diet, manage your weight, exercise regularly, manage stress, avoid drugs and alcohol, and keep your cholesterol and blood pressure under control.  Heart failure can cause very serious problems over time.  This information is not intended to replace advice given to you by your health care provider. Make sure you discuss any questions you have with your health care provider. Document Released: 03/28/2016 Document Revised: 11/29/2018 Document Reviewed: 03/28/2016 Elsevier Patient Education  2020 [place]. Heart Failure, Self Care Heart failure is a serious condition. This sheet explains things you need to do to take care of yourself at home. To help you stay as healthy as possible, you may be asked to change your diet, take certain medicines, and make other changes in your life. Your doctor may also give you more specific instructions. If you have problems or questions, call your doctor. What are the risks? Having heart failure makes it more likely for you to have some problems. These problems can get worse if you do not take good care of yourself. Problems may include:  Blood clotting problems. This may cause a stroke.  Damage to the kidneys, liver, or lungs.  Abnormal heart rhythms. Supplies needed:  Scale for weighing yourself.  Blood pressure monitor.  Notebook.  Medicines. How to care for yourself when you have heart failure Medicines Take over-the-counter and prescription medicines only as told by your doctor. Take your medicines every day.  Do not stop taking your medicine unless your doctor tells you to  do so.  Do not skip any medicines.  Get your prescriptions refilled before you run out of medicine. This is important. Eating and drinking   Eat heart-healthy foods. Talk with a diet specialist (dietitian) to create an eating plan.  Choose foods that: ? Have no trans fat. ? Are low in saturated fat and cholesterol.  Choose healthy foods, such as: ? Fresh or frozen fruits and vegetables. ? Fish. ? Low-fat (lean) meats. ? Legumes, such as beans, peas, and lentils. ? Fat-free or low-fat dairy products. ? Whole-grain foods. ? High-fiber foods.  Limit salt (sodium) if told by your doctor. Ask your diet specialist to tell you which seasonings are healthy for your heart.  Cook in healthy ways instead of frying. Healthy ways of cooking include roasting, grilling, broiling, baking, poaching, steaming, and stir-frying.  Limit how much fluid you drink, if told by your doctor. Alcohol use  Do not drink alcohol if: ? Your doctor tells you not to drink. ? Your heart was damaged by alcohol, or you have very bad heart failure. ? You are pregnant, may be pregnant, or are planning to become pregnant.  If you drink alcohol: ? Limit how much you use to:  0-1 drink a day for women.  0-2 drinks a day for men. ? Be aware of how much alcohol is in your drink. In the [place] one drink equals one 12 oz bottle of beer (355 mL), one 5 oz glass of wine (148 mL), or one 1 oz glass of hard liquor (44 mL). Lifestyle   Do not use any products that contain nicotine or tobacco, such as cigarettes, e-cigarettes, and chewing tobacco. If you need help quitting, ask your doctor. ? Do not use nicotine gum or patches before talking to your doctor.  Do not use illegal drugs.  Lose weight if told by your doctor.  Do physical activity if told by your doctor. Talk to your doctor before you begin an exercise if: ? You are an older adult. ? You have very bad heart failure.  Learn to manage stress. If you  need help, ask your doctor.  Get rehab (rehabilitation) to help you stay independent and  to help with your quality of life.  Plan time to rest when you get tired. Check weight and blood pressure   Weigh yourself every day. This will help you to know if fluid is building up in your body. ? Weigh yourself every morning after you pee (urinate) and before you eat breakfast. ? Wear the same amount of clothing each time. ? Write down your daily weight. Give your record to your doctor.  Check and write down your blood pressure as told by your doctor.  Check your pulse as told by your doctor. Dealing with very hot and very cold weather  If it is very hot: ? Avoid activities that take a lot of energy. ? Use air conditioning or fans, or find a cooler place. ? Avoid caffeine and alcohol. ? Wear clothing that is loose-fitting, lightweight, and light-colored.  If it is very cold: ? Avoid activities that take a lot of energy. ? Layer your clothes. ? Wear mittens or gloves, a hat, and a scarf when you go outside. ? Avoid alcohol. Follow these instructions at home:  Stay up to date with shots (vaccines). Get pneumococcal and flu (influenza) shots.  Keep all follow-up visits as told by your doctor. This is important. Contact a doctor if:  You gain weight quickly.  You have increasing shortness of breath.  You cannot do your normal activities.  You get tired easily.  You cough a lot.  You don't feel like eating or feel like you may vomit (nauseous).  You become puffy (swell) in your hands, feet, ankles, or belly (abdomen).  You cannot sleep well because it is hard to breathe.  You feel like your heart is beating fast (palpitations).  You get dizzy when you stand up. Get help right away if:  You have trouble breathing.  You or someone else notices a change in your behavior, such as having trouble staying awake.  You have chest pain or discomfort.  You pass out  (faint). These symptoms may be an emergency. Do not wait to see if the symptoms will go away. Get medical help right away. Call your local emergency services (911 in the [place] Do not drive yourself to the hospital. Summary  Heart failure is a serious condition. To care for yourself, you may have to change your diet, take medicines, and make other lifestyle changes.  Take your medicines every day. Do not stop taking them unless your doctor tells you to do so.  Eat heart-healthy foods, such as fresh or frozen fruits and vegetables, fish, lean meats, legumes, fat-free or low-fat dairy products, and whole-grain or high-fiber foods.  Ask your doctor if you can drink alcohol. You may have to stop alcohol use if you have very bad heart failure.  Contact your doctor if you gain weight quickly or feel that your heart is beating too fast. Get help right away if you pass out, or have chest pain or trouble breathing. This information is not intended to replace advice given to you by your health care provider. Make sure you discuss any questions you have with your health care provider. Document Released: 11/20/2018 Document Revised: 11/19/2018 Document Reviewed: 11/20/2018 Elsevier Patient Education  [place]. Heart Failure, Diagnosis  Heart failure means that your heart is not able to pump blood in the right way. This makes it hard for your body to work well. Heart failure is usually a long-term (chronic) condition. You must take good care of yourself and follow your  treatment plan from your doctor. What are the causes? This condition may be caused by:  High blood pressure.  Build up of cholesterol and fat in the arteries.  Heart attack. This injures the heart muscle.  Heart valves that do not open and close properly.  Damage of the heart muscle. This is also called cardiomyopathy.  Lung disease.  Abnormal heart rhythms. What increases the risk? The risk of heart failure goes up as  a person ages. This condition is also more likely to develop in people who:  Are overweight.  Are male.  Smoke or chew tobacco.  Abuse alcohol or illegal drugs.  Have taken medicines that can damage the heart.  Have diabetes.  Have abnormal heart rhythms.  Have thyroid problems.  Have low blood counts (anemia). What are the signs or symptoms? Symptoms of this condition include:  Shortness of breath.  Coughing.  Swelling of the feet, ankles, legs, or belly.  Losing weight for no reason.  Trouble breathing.  Waking from sleep because of the need to sit up and get more air.  Rapid heartbeat.  Being very tired.  Feeling dizzy, or feeling like you may pass out (faint).  Having no desire to eat.  Feeling like you may vomit (nauseous).  Peeing (urinating) more at night.  Feeling confused. How is this treated?     This condition may be treated with:  Medicines. These can be given to treat blood pressure and to make the heart muscles stronger.  Changes in your daily life. These may include eating a healthy diet, staying at a healthy body weight, quitting tobacco and illegal drug use, or doing exercises.  Surgery. Surgery can be done to open blocked valves, or to put devices in the heart, such as pacemakers.  A donor heart (heart transplant). You will receive a healthy heart from a donor. Follow these instructions at home:  Treat other conditions as told by your doctor. These may include high blood pressure, diabetes, thyroid disease, or abnormal heart rhythms.  Learn as much as you can about heart failure.  Get support as you need it.  Keep all follow-up visits as told by your doctor. This is important. Summary  Heart failure means that your heart is not able to pump blood in the right way.  This condition is caused by high blood pressure, heart attack, or damage of the heart muscle.  Symptoms of this condition include shortness of breath and swelling  of the feet, ankles, legs, or belly. You may also feel very tired or feel like you may vomit.  You may be treated with medicines, surgery, or changes in your daily life.  Treat other health conditions as told by your doctor. This information is not intended to replace advice given to you by your health care provider. Make sure you discuss any questions you have with your health care provider. Document Released: 05/16/2008 Document Revised: 10/25/2018 Document Reviewed: 10/25/2018 Elsevier Patient Education  [place] With Diabetes  Why Is Carbohydrate Counting Important?  Counting carbohydrate servings may help you control your blood glucose level so that you feel better.   The balance between the carbohydrates you eat and insulin determines what your blood glucose level will be after eating.   Carbohydrate counting can also help you plan your meals. Which Foods Have Carbohydrates? Foods with carbohydrates include:  Breads, crackers, and cereals   Pasta, rice, and grains   Starchy vegetables, such as potatoes,  corn, and peas   Beans and legumes   Milk, soy milk, and yogurt   Fruits and fruit juices   Sweets, such as cakes, cookies, ice cream, jam, and jelly Carbohydrate Servings In diabetes meal planning, 1 serving of a food with carbohydrate has about 15 grams of carbohydrate:  Check serving sizes with measuring cups and spoons or a food scale.   Read the Nutrition Facts on food labels to find out how many grams of carbohydrate are in foods you eat. The food lists in this handout show portions that have about 15 grams of carbohydrate.  Tips Meal Planning Tips  An Eating Plan tells you how many carbohydrate servings to eat at your meals and snacks. For many adults, eating 3 to 5 servings of carbohydrate foods at each meal and 1 or 2 carbohydrate servings for each snack works well.   In a healthy daily Eating Plan, most  carbohydrates come from:   At least 6 servings of fruits and nonstarchy vegetables   At least 6 servings of grains, beans, and starchy vegetables, with at least 3 servings from whole grains   At least 2 servings of milk or milk products  Check your blood glucose level regularly. It can tell you if you need to adjust when you eat carbohydrates.   Eating foods that have fiber, such as whole grains, and having very few salty foods is good for your health.   Eat 4 to 6 ounces of meat or other protein foods (such as soybean burgers) each day. Choose low-fat sources of protein, such as lean beef, lean pork, chicken, fish, low-fat cheese, or vegetarian foods such as soy.   Eat some healthy fats, such as olive oil, canola oil, and nuts.   Eat very little saturated fats. These unhealthy fats are found in butter, cream, and high-fat meats, such as bacon and sausage.   Eat very little or no trans fats. These unhealthy fats are found in all foods that list partially hydrogenated oil as an ingredient.  Label Reading Tips The Nutrition Facts panel on a label lists the grams of total carbohydrate in 1 standard serving. The label's standard serving may be larger or smaller than 1 carbohydrate serving. To figure out how many carbohydrate servings are in the food:  First, look at the label's standard serving size.   Check the grams of total carbohydrate. This is the amount of carbohydrate in 1 standard serving.   Divide the grams of total carbohydrate by 15. This number equals the number of carbohydrate servings in 1 standard serving. Remember: 1 carbohydrate serving is 15 grams of carbohydrate.   Note: You may ignore the grams of sugars on the Nutrition Facts panel because they are included in the grams of total carbohydrate.  Foods Recommended 1 serving = about 15 grams of carbohydrate Starches  1 slice bread (1 ounce)   1 tortilla (6-inch size)    large bagel (1 ounce)   2 taco shells  (5-inch size)    hamburger or hot dog bun ( ounce)    cup ready-to-eat unsweetened cereal    cup cooked cereal   1 cup broth-based soup   4 to 6 small crackers   1/3 cup pasta or rice (cooked)    cup beans, peas, corn, sweet potatoes, winter squash, or mashed or boiled potatoes (cooked)    large baked potato (3 ounces)    ounce pretzels, potato chips, or tortilla chips   3 cups popcorn (popped)  Fruit  1 small fresh fruit ( to 1 cup)    cup canned or frozen fruit   2 tablespoons dried fruit (blueberries, cherries, cranberries, mixed fruit, raisins)   17 small grapes (3 ounces)   1 cup melon or berries    cup unsweetened fruit juice Milk  1 cup fat-free or reduced-fat milk   1 cup soy milk   2/3 cup (6 ounces) nonfat yogurt sweetened with sugar-free sweetener Sweets and Desserts  2-inch square cake (unfrosted)   2 small cookies (2/3 ounce)    cup ice cream or frozen yogurt    cup sherbet or sorbet   1 tablespoon syrup, jam, jelly, table sugar, or honey   2 tablespoons light syrup Other Foods  Count 1 cup raw vegetables or  cup cooked nonstarchy vegetables as zero (0) carbohydrate servings or free foods. If you eat 3 or more servings at one meal, count them as 1 carbohydrate serving.   Foods that have less than 20 calories in each serving also may be counted as zero carbohydrate servings or free foods.   Count 1 cup of casserole or other mixed foods as 2 carbohydrate servings.  Carbohydrate Counting for People with Diabetes Sample 1-Day Menu  Breakfast 1 extra-small banana (1 carbohydrate serving)  1 cup low-fat or fat-free milk (1 carbohydrate serving)  1 slice whole wheat bread (1 carbohydrate serving)  1 teaspoon margarine  Lunch 2 ounces [place] slices  2 slices whole wheat bread (2 carbohydrate servings)  2 lettuce leaves  4 celery sticks  4 carrot sticks  1 medium apple (1 carbohydrate serving)  1 cup low-fat or fat-free milk  (1 carbohydrate serving)  Afternoon Snack 2 tablespoons raisins (1 carbohydrate serving)  3/4 ounce unsalted mini pretzels (1 carbohydrate serving)  Evening Meal 3 ounces lean roast beef  1/2 large baked potato (2 carbohydrate servings)  1 tablespoon reduced-fat sour cream  1/2 cup green beans  1 tablespoon light salad dressing  1 whole wheat dinner roll (1 carbohydrate serving)  1 teaspoon margarine  1 cup melon balls (1 carbohydrate serving)  Evening Snack 2 tablespoons unsalted nuts   Carbohydrate Counting for People with Diabetes Vegan Sample 1-Day Menu  Breakfast 1 cup cooked oatmeal (2 carbohydrate servings)   cup blueberries (1 carbohydrate serving)  2 tablespoons flaxseeds  1 cup soymilk fortified with calcium and vitamin D  1 cup coffee  Lunch 2 slices whole wheat bread (2 carbohydrate servings)   cup baked tofu   cup lettuce  2 slices tomato  2 slices avocado   cup baby carrots  1 orange (1 carbohydrate serving)  1 cup soymilk fortified with calcium and vitamin D   Evening Meal Burrito made with: 1 6-inch corn tortilla (1 carbohydrate serving)  1 cup refried vegetarian beans (1 carbohydrate serving)   cup chopped tomatoes   cup lettuce   cup salsa  1/3 cup brown rice (1 carbohydrate serving)  1 tablespoon olive oil for rice   cup zucchini   Evening Snack 6 small whole grain crackers (1 carbohydrate serving)  2 apricots ( carbohydrate serving)   cup unsalted peanuts ( carbohydrate serving)     Carbohydrate Counting for People with Diabetes Vegetarian (Lacto-Ovo) Sample 1-Day Menu  Breakfast 1 cup cooked oatmeal (2 carbohydrate servings)   cup blueberries (1 carbohydrate serving)  2 tablespoons flaxseeds  1 egg  1 cup 1% milk (1 carbohydrate serving)  1 cup coffee  Lunch 2 slices whole wheat bread (2 carbohydrate  servings)  2 ounces low-fat cheese   cup lettuce  2 slices tomato  2 slices avocado   cup baby carrots  1 orange (1 carbohydrate  serving)  1 cup unsweetened tea  Evening Meal Burrito made with: 1 6-inch corn tortilla (1 carbohydrate serving)   cup refried vegetarian beans (1 carbohydrate serving)   cup tomatoes   cup lettuce   cup salsa  1/3 cup brown rice (1 carbohydrate serving)  1 tablespoon olive oil for rice   cup zucchini  1 cup 1% milk (1 carbohydrate serving)  Evening Snack 6 small whole grain crackers (1 carbohydrate serving)  2 apricots ( carbohydrate serving)   cup unsalted peanuts ( carbohydrate serving)    Copyright 2020  Academy of Nutrition and Dietetics. All rights reserved.  Using Nutrition Labels: Carbohydrate   Serving Size   Look at the serving size. All the information on the label is based on this portion.  Servings Per Container   The number of servings contained in the package.  Guidelines for Carbohydrate   Look at the total grams of carbohydrate in the serving size.   1 carbohydrate choice = 15 grams of carbohydrate. Range of Carbohydrate Grams Per Choice  Carbohydrate Grams/Choice Carbohydrate Choices  6-10   11-20 1  21-25 1  26-35 2  36-40 2  41-50 3  51-55 3  56-65 4  66-70 4  71-80 5    Copyright 2020  Academy of Nutrition and Dietetics. All rights reserved.

## 2019-07-02 NOTE — Patient Outreach (Signed)
  [place] [facility]) Care Management Chronic Special Needs Program    07/02/2019  Name: [name]., DOB: [birth year]  MRN: [id number]   [name] is enrolled in a chronic special needs plan. RNCM spoke with [place], social work with advanced heart failure clinic regarding paramedicine team. Discussed previous hospitalizations, upcoming transition, barriers. She reports discussion with palliative care regarding paramedicine services. RNCM in agreement that client could benefit from para-medicine services. She reports plans to see client tomorrow to discuss the program.  Plan: RNCM will continue to follow.  Thea Silversmith, RN, MSN, [place] [phone]

## 2019-07-02 NOTE — TOC Initial Note (Addendum)
Transition of Care (TOC) - Initial/Assessment Note    Patient Details  Name: [name]. MRN: [id number] Date of Birth: [birth year]  Transition of Care [facility]) CM/SW Contact:    Marilu Favre, RN Phone Number: 07/02/2019, 1:35 PM  Clinical Narrative:                 Confirmed face sheet information with patient.Patient from home with wife and [facility] HHRN,PT and aide. Patient has home oxygen 2 to 3 l and home trilogy . Patient currently on oxygen at 4 l continuous [place] here at hospital will get orders for home to increase amount. Patient requesting [place] to call his wife regarding trilogy vent settings at home. Per [facility] [employer] has 24/7 RT on call and patient will have to be at home when they visit. Explained to patient.  Spoke to [place] with [employer] regarding above. Also spoke to [facility] with [facility] regarding potential discharge for Friday. Patient plans to discharge to home vis car, his wife has a portable oxygen tank that she will bring at discharge.  Expected Discharge Plan: [place] Barriers to Discharge: Continued Medical Work up   Patient Goals and CMS Choice Patient states their goals for this hospitalization and ongoing recovery are:: to go home CMS Medicare.gov Compare Post Acute Care list provided to:: Patient Choice offered to / list presented to : Patient  Expected Discharge Plan and Services Expected Discharge Plan: [place] In-house Referral: Hospice / Palliative Care Discharge Planning Services: CM Consult Post Acute Care Choice: Home Health, Durable Medical Equipment Living arrangements for the past 2 months: Single Family Home                 DME Arranged: Oxygen DME Agency: [facility] Date DME Agency Contacted: 07/02/19 Time DME Agency Contacted: [phone] Representative spoke with at DME Agency: [place]: RN, PT, Nurse's Aide [place] Agency: [facility] Date [place]: 07/02/19 Time [place]: 1333 Representative spoke with at [place]: [place] Arrangements/Services Living arrangements for the past 2 months: [place] Lives with:: Spouse Patient language and need for interpreter reviewed:: Yes Do you feel safe going back to the place where you live?: Yes      Need for Family Participation in Patient Care: Yes (Comment) Care giver support system in place?: Yes (comment) Current home services: DME Criminal Activity/Legal Involvement Pertinent to Current Situation/Hospitalization: No - Comment as needed  Activities of Daily Living      Permission Sought/Granted   Permission granted to share information with : Yes, Verbal Permission Granted     Permission granted to share info w AGENCY: [facility]        Emotional Assessment Appearance:: Appears stated age Attitude/Demeanor/Rapport: Engaged Affect (typically observed): Accepting Orientation: : Oriented to Self, Oriented to Place, Oriented to  Time, Oriented to Situation Alcohol / Substance Use: Not Applicable Psych Involvement: No (comment)  Admission diagnosis:  Acute pulmonary edema (HCC) [J81.0] Acute on chronic respiratory failure with hypoxia and hypercapnia (HCC) [F09.32, J96.22] Patient Active Problem List   Diagnosis Date Noted  . Acute pulmonary edema (HCC)   . Chronic pain syndrome   . Hypokalemia   . History of chronic atrial fibrillation   . Acute on chronic diastolic CHF (congestive heart failure) ([place])   . Dysphagia   . Goals of care, counseling/discussion   . Palliative care encounter   .  CKD (chronic kidney disease), stage IV ([place]) 04/23/2019  . Obstructive sleep apnea   . Respiratory failure ([place]) 03/22/2019  . Morbid obesity with BMI of 50.0-59.9, adult ([place])   . Acute on chronic diastolic heart failure ([place]) 02/23/2019  . Acute renal failure with acute renal cortical necrosis superimposed on stage 4 chronic kidney disease ([place])   . Acute  respiratory failure with hypoxia and hypercapnia ([place]) 02/22/2019  . SOB (shortness of breath) 02/22/2019  . Chronic obstructive pulmonary disease ([place]) 02/22/2019  . CKD stage 3 secondary to diabetes ([place]) 09/14/2018  . Type 2 diabetes mellitus with other specified complication ([place]) 76/54/6503  . Chronic diastolic heart failure ([place]) 05/20/2018  . Acute diastolic heart failure ([place])   . Atrial fibrillation with RVR ([place])   . Carbon dioxide retention   . AKI (acute kidney injury) ([place])   . Pressure injury of skin 01/29/2017  . Hereditary and idiopathic peripheral neuropathy 01/08/2017  . Acute on chronic respiratory failure with hypoxia and hypercapnia ([place]) 02/16/2016  . Conjunctivitis 10/18/2015  . 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 Headache due to intracranial disease 10/13/2013  . Edema 12/17/2012  . 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 Status post craniotomy 10/12/2012  . CAD (coronary artery disease) of artery bypass graft 09/09/2012  . 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 Obesity, Class III, BMI 40-49.9 (morbid obesity) ([place]) 09/09/2012   PCP:  Caren Macadam, MD Pharmacy:   [id number] [place], [place] - [address] [place] [address] Phone: [phone] Fax: [phone]     Social Determinants of Health (SDOH) Interventions    Readmission Risk Interventions Readmission Risk Prevention Plan 06/26/2019 04/24/2019 04/18/2019  Transportation Screening Complete Complete Complete  PCP or Specialist Appt within 3-5 Days - - -  Not Complete comments - - -  [place] or [place] Work Consult for [place] Planning/Counseling - - -  Palliative Care Screening - - -  Medication Review Press photographer) - Complete Complete  PCP or Specialist appointment within 3-5 days of discharge - (No Data) Patient refused  [place] or [place] - Complete -  Palliative Care Screening (No Data) Complete -  [place] (No Data) Not Applicable -  Some recent data might be hidden

## 2019-07-02 NOTE — Progress Notes (Signed)
Occupational Therapy Treatment Patient Details Name: [name]. 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 Pt intubated 11/4-11/6.   OT comments  Pt increasing ability to follow commands for safety and to attend to tasks. Pt on continuous 3L O2 throughout task. pt ambulating from bathroom to recliner with minA +2 for safety. Pt requires maxA to [place] otherwise uses toilet aide at home for LB ADL. BUE AROM exercises for shoulder and elbow. Pt would benefit from continued OT skilled services for ADL and energy conservation. OT following acutely.    Follow Up Recommendations  Home health OT;Supervision - Intermittent    Equipment Recommendations  None recommended by OT    Recommendations for Other Services      Precautions / Restrictions Precautions Precautions: Fall;Other (comment) Precaution Comments: Visually impaired in R eye, monitor SPO2, supplemental O2 Restrictions Weight Bearing Restrictions: No       Mobility Bed Mobility Overal bed mobility: Needs Assistance Bed Mobility: Supine to Sit     Supine to sit: Min guard     General bed mobility comments: In recliner  Transfers Overall transfer level: Needs assistance Equipment used: Rolling walker (2 wheeled) Transfers: Sit to/from Stand Sit to Stand: Min assist;+2 physical assistance;+2 safety/equipment         General transfer comment: Pt using large RW for the large body habitus    Balance Overall balance assessment: Needs assistance Sitting-balance support: Feet supported Sitting balance-Leahy Scale: Fair     Standing balance support:  Bilateral upper extremity supported Standing balance-Leahy Scale: Poor                             ADL either performed or assessed with clinical judgement   ADL Overall ADL's : Needs assistance/impaired     Grooming: Set up;Sitting                   Toilet Transfer: Min guard;Minimal assistance;Ambulation;Comfort height toilet;Grab bars   Toileting- Clothing Manipulation and Hygiene: Maximal assistance;Total assistance;Sitting/lateral lean;Sit to/from stand Toileting - Clothing Manipulation Details (indicate cue type and reason): maxA to [place] otherwise uses toilet aide at home       General ADL Comments: Pt increasing ability to follow commands for safety and to attend to tasks.     Vision       Perception     Praxis      Cognition Arousal/Alertness: Awake/alert Behavior During Therapy: WFL for tasks assessed/performed Overall Cognitive Status: Impaired/Different from baseline Area of Impairment: Safety/judgement;Problem solving                         Safety/Judgement: Decreased awareness of safety   Problem Solving: Difficulty sequencing;Requires verbal cues General Comments: Poor safety awareness and very particular.        Exercises     Shoulder Instructions       General Comments Pt on continuous 3L O2 throughout task. pt ambulating from bathroom to recliner with minA +2 for safety.    Pertinent Vitals/ Pain       Pain Assessment:  No/denies pain  Home Living                                          Prior Functioning/Environment              Frequency  Min 2X/week        Progress Toward Goals  OT Goals(current goals can now be found in the care plan section)  Progress towards OT goals: Progressing toward goals  Acute Rehab OT Goals Patient Stated Goal: home OT Goal Formulation: With patient Time For Goal Achievement: 07/12/19 Potential to Achieve Goals: Fair ADL Goals Pt Will  Perform Grooming: with set-up;sitting Pt Will Perform Upper Body Dressing: with set-up;sitting Pt Will Perform Lower Body Dressing: with max assist;sit to/from stand Pt Will Transfer to Toilet: with min guard assist;stand pivot transfer;bedside commode Pt Will Perform Toileting - Clothing Manipulation and hygiene: sit to/from stand;with adaptive equipment;with min guard assist Pt/caregiver will Perform Home Exercise Program: Increased strength;Both right and left upper extremity Additional ADL Goal #1: Pt will recall 3 way of conserving energy during ADL with 1-2 verbal cues. Additional ADL Goal #2: Pt will perform bed mobility at min A +2 prior to engaging in ADL  Plan Discharge plan remains appropriate    Co-evaluation                 AM-PAC OT "6 Clicks" Daily Activity     Outcome Measure   Help from another person eating meals?: None Help from another person taking care of personal grooming?: A Little Help from another person toileting, which includes using toliet, bedpan, or urinal?: A Lot Help from another person bathing (including washing, rinsing, drying)?: A Lot Help from another person to put on and taking off regular upper body clothing?: A Lot Help from another person to put on and taking off regular lower body clothing?: Total 6 Click Score: 14    End of Session Equipment Utilized During Treatment: Rolling walker;Oxygen  OT Visit Diagnosis: Unsteadiness on feet (R26.81);Other abnormalities of gait and mobility (R26.89);Other symptoms and signs involving cognitive function   Activity Tolerance Patient tolerated treatment well   Patient Left in chair;with call bell/phone within reach;with chair alarm set   Nurse Communication Mobility status        Time: 9379-0240 OT Time Calculation (min): 21 min  Charges: OT General Charges $OT Visit: 1 Visit OT Treatments $Self Care/Home Management : 8-22 mins  Darryl Nestle) Marsa Aris OTR/L Acute Rehabilitation  Services Pager: [phone] Office: [phone]    Audie Pinto 07/02/2019, 2:27 PM

## 2019-07-02 NOTE — Progress Notes (Signed)
Pt insisted that I stop his nebulizers because it has a steroid. He said he is allergic to steroids. Vitals as noted.

## 2019-07-03 DIAGNOSIS — Z66 Do not resuscitate: Secondary | ICD-10-CM | POA: Diagnosis present

## 2019-07-03 LAB — CBC
HCT: 43.7 % (ref 39.0–52.0)
Hemoglobin: 13.2 g/dL (ref 13.0–17.0)
MCH: 28.4 pg (ref 26.0–34.0)
MCHC: 30.2 g/dL (ref 30.0–36.0)
MCV: 94 fL (ref 80.0–100.0)
Platelets: 219 10*3/uL (ref 150–400)
RBC: 4.65 MIL/uL (ref 4.22–5.81)
RDW: 14.3 % (ref 11.5–15.5)
WBC: 18 10*3/uL — ABNORMAL HIGH (ref 4.0–10.5)
nRBC: 0 % (ref 0.0–0.2)

## 2019-07-03 LAB — BASIC METABOLIC PANEL
Anion gap: 16 — ABNORMAL HIGH (ref 5–15)
BUN: 67 mg/dL — ABNORMAL HIGH (ref 6–20)
CO2: 34 mmol/L — ABNORMAL HIGH (ref 22–32)
Calcium: 9.1 mg/dL (ref 8.9–10.3)
Chloride: 88 mmol/L — ABNORMAL LOW (ref 98–111)
Creatinine, Ser: 2.52 mg/dL — ABNORMAL HIGH (ref 0.61–1.24)
GFR calc Af Amer: 31 mL/min — ABNORMAL LOW (ref >60)
GFR calc non Af Amer: 27 mL/min — ABNORMAL LOW (ref >60)
Glucose, Bld: 189 mg/dL — ABNORMAL HIGH (ref 70–99)
Potassium: 2.8 mmol/L — ABNORMAL LOW (ref 3.5–5.1)
Sodium: 138 mmol/L (ref 135–145)

## 2019-07-03 LAB — GLUCOSE, CAPILLARY
Glucose-Capillary: 213 mg/dL — ABNORMAL HIGH (ref 70–99)
Glucose-Capillary: 172 mg/dL — ABNORMAL HIGH (ref 70–99)
Glucose-Capillary: 164 mg/dL — ABNORMAL HIGH (ref 70–99)
Glucose-Capillary: 152 mg/dL — ABNORMAL HIGH (ref 70–99)

## 2019-07-03 LAB — MAGNESIUM: Magnesium: 2.1 mg/dL (ref 1.7–2.4)

## 2019-07-03 MED ORDER — POTASSIUM CHLORIDE CRYS ER 20 MEQ PO TBCR
40.0000 meq | EXTENDED_RELEASE_TABLET | ORAL | Status: AC
  Administered 2019-07-03 (×3): 40 meq via ORAL
  Filled 2019-07-03 (×3): qty 2

## 2019-07-03 MED ORDER — TORSEMIDE 20 MG PO TABS
60.0000 mg | ORAL_TABLET | Freq: Two times a day (BID) | ORAL | Status: DC
  Administered 2019-07-03 – 2019-07-04 (×3): 60 mg via ORAL
  Filled 2019-07-03 (×5): qty 3

## 2019-07-03 NOTE — Progress Notes (Signed)
RT placed pt on CPAP dream station in auto titrate for the night with setting of max 20 min 4 with 6 Lpm bled into the system. Pt respiratory status stable on CPAP at this time. RT will continue to monitor.

## 2019-07-03 NOTE — Care Management (Signed)
Spoke with Zack with [facility] again he will have someone from [employer] call patient's wife to schedule a time adjust patient's trilogy   Magdalen Spatz RN

## 2019-07-03 NOTE — Progress Notes (Signed)
PROGRESS NOTE    [name].  VFI:[id number] DOB: 07-Apr-1961 DOA: 06/25/2019 PCP: Caren Macadam, MD    Brief Narrative:   [name] is a 58 year old male with history of COPD, chronic hypoxic/hypercapnic respiratory failure on 2 to 3 L nasal cannula at home but had been switched to trilogy vent at home after recent hospitalization and discharge home, non adherent to CPAP, OSA, chronic renal disease stage III-IV, chronic diastolic heart failure, multiple recent hospitalizations with worsening respiratory failure requiring intubation presented with worsening shortness of breath on 06/25/2019.  He was found to be hypoxic/hypercapnic and failed NIV therapy and required intubation.  He was admitted under CCM service and also required vasopressors.  COVID-19 testing was negative.  Initially started on broad-spectrum antibiotics.  He was also treated with intravenous Lasix and diuresed well.  He was extubated on 06/27/2019 and transferred to [facility] service on 06/29/2019.  Palliative care was also consulted.   Assessment & Plan:   Active Problems:   Acute on chronic respiratory failure with hypoxia and hypercapnia (HCC)   Acute pulmonary edema (HCC)   Acute on chronic hypoxic and hypercarbic respiratory failure COPD exacerbation Patient presented to ED with shortness of breath found to be hypoxic requiring intubation.  Was successfully extubated on 06/27/2019 and transferred to the hospital service on 06/29/2019.  Covid-19/SARS-CoV-2 negative.  Patient received 3-day course of cefepime/azithromycin and 1 dose of vancomycin on admission. --Discontinue steroids today, as patient refusing --Continue Pulmicort and Brovana nebs twice daily --Duo nebs every 4 hours as needed --Continue supplemental oxygen, titrate for SPO2 greater than 88% --Encouraged continued compliance with CPAP/BiPAP or home trilogy  Acute on chronic diastolic heart failure Limited echo during this  hospitalization was poor study.  Limited echo on 02/22/2019 showed EF of 55 to 60%.  Aggressively diuresed with IV diuretics during initial hospitalization. --net negative 2.9L past 24h and 31L liters since admission --Continue carvedilol --Continue torsemide 60 mg p.o. BID --Imdur --Continue to monitor strict I's and O's and daily weights; fluid restriction  Chronic kidney disease stage IV Baseline creatinine 2.3-2.7.  Not a candidate for dialysis given his poor medical compliance. --Creatinine improved to 2.52 today; creatinine peaked at 2.89 during admission --Continue diuresis as above --Monitor renal function daily  Anemia of chronic disease --Etiology secondary to chronic kidney disease.  Hemoglobin stable  Hypokalemia --K 2.8 today, will replete.  Repeat a.m. labs  OSA/OHS, noncompliance to CPAP Patient states inability to tolerate trilogy mask at home, compliant overnight. --Continue CPAP while inpatient  Chronic paroxysmal atrial fibrillation  Rate controlled --Continue Eliquis and amiodarone.    Diabetes mellitus type 2 uncontrolled with hyperglycemia.  --Continue Lantus and CBGs with SSI.  Generalized deconditioning Noncompliance Overall prognosis is very poor.  Patient is noncompliant to medications as well as CPAP.  Patient has had recurrent recent hospitalization and multiple intubations.  This time, he apparently has decided that he would not want any more intubations. His overall insight is very poor.  He probably does not understand the gravity of the situation and keeps mentioning that he does not want to go to a rehab facility and wants to go home where his wife can take care of him. --Recommend residential hospice; but declines --Palliative care following; appreciate assistance  Obesity BMI 33.05, discussed with patient need for aggressive lifestyle changes and weight loss as this complicates all facets of care.   DVT prophylaxis: Heparin Code  Status: DNR Family Communication: No family present at bedside Disposition  Plan: Continue inpatient, likely candidate for residential versus home hospice but declines, declines SNF placement, likely discharge home with home health services in 1-2 days.   Consultants:   Palliative care  PCCM  Procedures:   Intubation  Transthoracic echocardiogram IMPRESSIONS Very poor study. Cannot comment on valve structure. Cannot accurately comment on LVF or wall motion wven with definiy contrast as imgas are very poor in quality.  Antimicrobials:   Vancomycin 11/4 - 11/5  Azithromycin 11/4 - 11/7  Cefepime 11/4 - 11/7    Subjective: Patient seen and examined bedside, resting comfortably.  Discussed with him regarding his recurrent hospitalizations and his noncompliance with medical therapy, including adherence to his CPAP versus trilogy at home and low-salt diet and fluid restriction.  No other complaints or concerns at this time.  Updated patient's spouse via telephone this afternoon.  Denies headache, no fever/chills/night sweats, no chest pain, no palpitations, no worse shortness of breath than his normal baseline, no abdominal pain, no cough/congestion.  No acute events overnight per nursing staff.  Objective: Vitals:   07/02/19 2153 07/03/19 0035 07/03/19 0355 07/03/19 0500  BP: 127/78  132/81   Pulse: 67 78 75   Resp: 19 (!) 22 20   Temp: 98.2 F (36.8 C)  98.5 F (36.9 C)   TempSrc: Oral  Oral   SpO2: 98% 97% 98%   Weight:    (!) 158 kg  Height:        Intake/Output Summary (Last 24 hours) at 07/03/2019 1311 Last data filed at 07/03/2019 0602 Gross per 24 hour  Intake 420 ml  Output 3001 ml  Net -2581 ml   Filed Weights   06/25/19 0819 06/28/19 0340 07/03/19 0500  Weight: 105 kg 107.5 kg (!) 158 kg    Examination:  General exam: Appears calm and comfortable, chronically ill appearance, appears older than stated age, obese Respiratory system: Decreased breath  sounds bilateral bases, with mild crackles, normal respiratory effort, on 4 L nasal cannula oxygenating 100%. Cardiovascular system: S1 & S2 heard, RRR. No JVD, murmurs, rubs, gallops or clicks.  Trace to 1+ pedal edema bilateral lower extremities to mid shin. Gastrointestinal system: Abdomen is nondistended, protuberant, soft and nontender. No organomegaly or masses felt. Normal bowel sounds heard. Central nervous system: Alert and oriented. No focal neurological deficits. Extremities: Symmetric 5 x 5 power. Skin: No rashes, lesions or ulcers Psychiatry: Judgement and insight appear extremely poor. Mood & affect appropriate.     Data Reviewed: I have personally reviewed following labs and imaging studies  CBC: Recent Labs  Lab 06/27/19 0439 06/27/19 0501 06/28/19 0258 06/30/19 0417 07/02/19 0353 07/03/19 0432  WBC 11.5*  --  11.3* 11.7* 15.9* 18.0*  NEUTROABS  --   --   --  7.5 11.9*  --   HGB 10.6* 11.6* 11.0* 12.2* 13.5 13.2  HCT 36.4* 34.0* 38.0* 41.0 45.2 43.7  MCV 96.6  --  97.9 95.6 95.0 94.0  PLT 195  --  202 181 220 099   Basic Metabolic Panel: Recent Labs  Lab 06/27/19 0439  06/28/19 0258 06/30/19 0417 07/01/19 0236 07/02/19 0353 07/03/19 0432  NA 146*   < > 149* 138 137 139 138  K 3.6   < > 3.4* 3.3* 3.0* 3.2* 2.8*  CL 103  --  101 90* 88* 89* 88*  CO2 29  --  33* 32 34* 35* 34*  GLUCOSE 292*  --  213* 204* 184* 206* 189*  BUN 71*  --  83* 89* 83* 76* 67*  CREATININE 2.89*  --  2.81* 2.69* 2.59* 2.41* 2.52*  CALCIUM 9.6  --  8.8* 8.8* 9.1 9.0 9.1  MG 2.4  --  2.6* 2.3 2.1 2.1 2.1  PHOS 2.5  --  4.4  --   --   --   --    < > = values in this interval not displayed.   GFR: Estimated Creatinine Clearance: 49 mL/min (A) (by C-G formula based on SCr of 2.52 mg/dL (H)). Liver Function Tests: No results for input(s): AST, ALT, ALKPHOS, BILITOT, PROT, ALBUMIN in the last 168 hours. No results for input(s): LIPASE, AMYLASE in the last 168 hours. No results for  input(s): AMMONIA in the last 168 hours. Coagulation Profile: No results for input(s): INR, PROTIME in the last 168 hours. Cardiac Enzymes: No results for input(s): CKTOTAL, CKMB, CKMBINDEX, TROPONINI in the last 168 hours. BNP (last 3 results) No results for input(s): PROBNP in the last 8760 hours. HbA1C: No results for input(s): HGBA1C in the last 72 hours. CBG: Recent Labs  Lab 07/02/19 1144 07/02/19 1745 07/02/19 2126 07/03/19 0803 07/03/19 1143  GLUCAP 334* 298* 290* 213* 172*   Lipid Profile: No results for input(s): CHOL, HDL, LDLCALC, TRIG, CHOLHDL, LDLDIRECT in the last 72 hours. Thyroid Function Tests: No results for input(s): TSH, T4TOTAL, FREET4, T3FREE, THYROIDAB in the last 72 hours. Anemia Panel: No results for input(s): VITAMINB12, FOLATE, FERRITIN, TIBC, IRON, RETICCTPCT in the last 72 hours. Sepsis Labs: No results for input(s): PROCALCITON, LATICACIDVEN in the last 168 hours.  Recent Results (from the past 240 hour(s))  SARS Coronavirus 2 by RT PCR (hospital order, performed in [facility] hospital lab) Nasopharyngeal Nasopharyngeal Swab     Status: None   Collection Time: 06/25/19  8:45 AM   Specimen: Nasopharyngeal Swab  Result Value Ref Range Status   SARS Coronavirus 2 NEGATIVE NEGATIVE Final    Comment: (NOTE) If result is NEGATIVE SARS-CoV-2 target nucleic acids are NOT DETECTED. The SARS-CoV-2 RNA is generally detectable in upper and lower  respiratory specimens during the acute phase of infection. The lowest  concentration of SARS-CoV-2 viral copies this assay can detect is 250  copies / mL. A negative result does not preclude SARS-CoV-2 infection  and should not be used as the sole basis for treatment or other  patient management decisions.  A negative result may occur with  improper specimen collection / handling, submission of specimen other  than nasopharyngeal swab, presence of viral mutation(s) within the  areas targeted by this assay, and  inadequate number of viral copies  (<250 copies / mL). A negative result must be combined with clinical  observations, patient history, and epidemiological information. If result is POSITIVE SARS-CoV-2 target nucleic acids are DETECTED. The SARS-CoV-2 RNA is generally detectable in upper and lower  respiratory specimens dur ing the acute phase of infection.  Positive  results are indicative of active infection with SARS-CoV-2.  Clinical  correlation with patient history and other diagnostic information is  necessary to determine patient infection status.  Positive results do  not rule out bacterial infection or co-infection with other viruses. If result is PRESUMPTIVE POSTIVE SARS-CoV-2 nucleic acids MAY BE PRESENT.   A presumptive positive result was obtained on the submitted specimen  and confirmed on repeat testing.  While 2019 novel coronavirus  (SARS-CoV-2) nucleic acids may be present in the submitted sample  additional confirmatory testing may be necessary for epidemiological  and / or clinical  management purposes  to differentiate between  SARS-CoV-2 and other Sarbecovirus currently known to infect humans.  If clinically indicated additional testing with an alternate test  methodology [phone]) is advised. The SARS-CoV-2 RNA is generally  detectable in upper and lower respiratory sp ecimens during the acute  phase of infection. The expected result is Negative. Fact Sheet for Patients:  StrictlyIdeas.no Fact Sheet for Healthcare Providers: BankingDealers.co.za This test is not yet approved or cleared by the [place] FDA and has been authorized for detection and/or diagnosis of SARS-CoV-2 by FDA under an Emergency Use Authorization (EUA).  This EUA will remain in effect (meaning this test can be used) for the duration of the COVID-19 declaration under Section 564(b)(1) of the Act, 21 U.S.C. section 360bbb-3(b)(1), unless the  authorization is terminated or revoked sooner. Performed at [facility], [place] [address] [place], [place] [address]   Urine culture     Status: Abnormal   Collection Time: 06/25/19 12:16 PM   Specimen: Urine, Clean Catch  Result Value Ref Range Status   Specimen Description URINE, CLEAN CATCH  Final   Special Requests   Final    NONE Performed at [facility], [place] [address], [place], [place] [address]    Culture (A)  Final    >=100,000 COLONIES/mL KLEBSIELLA PNEUMONIAE Confirmed Extended Spectrum Beta-Lactamase Producer (ESBL).  In bloodstream infections from ESBL organisms, carbapenems are preferred over piperacillin/tazobactam. They are shown to have a lower risk of mortality.    Report Status 06/27/2019 FINAL  Final   Organism ID, Bacteria KLEBSIELLA PNEUMONIAE (A)  Final      Susceptibility   Klebsiella pneumoniae - MIC*    AMPICILLIN >=32 RESISTANT Resistant     CEFAZOLIN >=64 RESISTANT Resistant     CEFTRIAXONE >=64 RESISTANT Resistant     CIPROFLOXACIN 1 SENSITIVE Sensitive     GENTAMICIN <=1 SENSITIVE Sensitive     IMIPENEM <=0.25 SENSITIVE Sensitive     NITROFURANTOIN 64 INTERMEDIATE Intermediate     TRIMETH/SULFA >=320 RESISTANT Resistant     AMPICILLIN/SULBACTAM >=32 RESISTANT Resistant     PIP/TAZO 16 SENSITIVE Sensitive     Extended ESBL POSITIVE Resistant     * >=100,000 COLONIES/mL KLEBSIELLA PNEUMONIAE  Culture, blood (routine x 2)     Status: None   Collection Time: 06/25/19 12:32 PM   Specimen: BLOOD LEFT WRIST  Result Value Ref Range Status   Specimen Description BLOOD LEFT WRIST  Final   Special Requests   Final    BOTTLES DRAWN AEROBIC ONLY Blood Culture adequate volume   Culture   Final    NO GROWTH 5 DAYS Performed at [facility], [address], [place], [place] [address]    Report Status 06/30/2019 FINAL  Final  Culture, blood (routine x 2)     Status: None   Collection Time: 06/25/19 12:54 PM   Specimen: BLOOD RIGHT HAND   Result Value Ref Range Status   Specimen Description BLOOD RIGHT HAND  Final   Special Requests   Final    BOTTLES DRAWN AEROBIC AND ANAEROBIC Blood Culture adequate volume   Culture   Final    NO GROWTH 5 DAYS Performed at [facility], [place] [address], [place], [place] [address]    Report Status 06/30/2019 FINAL  Final  MRSA PCR Screening     Status: None   Collection Time: 06/25/19  3:00 PM   Specimen: Nasal Mucosa; Nasopharyngeal  Result Value Ref Range  Status   MRSA by PCR NEGATIVE NEGATIVE Final    Comment:        The GeneXpert MRSA Assay (FDA approved for NASAL specimens only), is one component of a comprehensive MRSA colonization surveillance program. It is not intended to diagnose MRSA infection nor to guide or monitor treatment for MRSA infections. Performed at [facility], [place] [address] [place], [place] [address]   Respiratory Panel by PCR     Status: None   Collection Time: 06/25/19  4:40 PM   Specimen: Nasopharyngeal Swab; Respiratory  Result Value Ref Range Status   Adenovirus NOT DETECTED NOT DETECTED Final   Coronavirus 229E NOT DETECTED NOT DETECTED Final    Comment: (NOTE) The Coronavirus on the Respiratory Panel, DOES NOT test for the novel  Coronavirus (2019 nCoV)    Coronavirus HKU1 NOT DETECTED NOT DETECTED Final   Coronavirus NL63 NOT DETECTED NOT DETECTED Final   Coronavirus OC43 NOT DETECTED NOT DETECTED Final   Metapneumovirus NOT DETECTED NOT DETECTED Final   Rhinovirus / Enterovirus NOT DETECTED NOT DETECTED Final   Influenza A NOT DETECTED NOT DETECTED Final   Influenza B NOT DETECTED NOT DETECTED Final   Parainfluenza Virus 1 NOT DETECTED NOT DETECTED Final   Parainfluenza Virus 2 NOT DETECTED NOT DETECTED Final   Parainfluenza Virus 3 NOT DETECTED NOT DETECTED Final   Parainfluenza Virus 4 NOT DETECTED NOT DETECTED Final   Respiratory Syncytial Virus NOT DETECTED NOT DETECTED Final   Bordetella pertussis NOT DETECTED NOT  DETECTED Final   Chlamydophila pneumoniae NOT DETECTED NOT DETECTED Final   Mycoplasma pneumoniae NOT DETECTED NOT DETECTED Final    Comment: Performed at [facility], [place]. [address], [place], [place] [address]  Culture, respiratory (non-expectorated)     Status: None   Collection Time: 06/25/19  4:45 PM   Specimen: Tracheal Aspirate; Respiratory  Result Value Ref Range Status   Specimen Description TRACHEAL ASPIRATE  Final   Special Requests NONE  Final   Gram Stain   Final    FEW WBC PRESENT,BOTH PMN AND MONONUCLEAR NO ORGANISMS SEEN    Culture   Final    Consistent with normal respiratory flora. Performed at [facility], [place] [address], [place], [place] [address]    Report Status 06/29/2019 FINAL  Final         Radiology Studies: No results found.      Scheduled Meds: . AeroChamber Plus Flo-Vu Large  1 each Other Once  . amiodarone  100 mg Oral Daily  . apixaban  5 mg Oral BID  . arformoterol  15 mcg Nebulization BID  . budesonide (PULMICORT) nebulizer solution  0.25 mg Nebulization BID  . carvedilol  12.5 mg Oral BID WC  . Chlorhexidine Gluconate Cloth  6 each Topical Daily  . dextromethorphan-guaiFENesin  1 tablet Oral BID  . HYDROcodone-acetaminophen  1 tablet Oral Q6H  . insulin aspart  0-20 Units Subcutaneous TID WC  . insulin aspart  0-5 Units Subcutaneous QHS  . insulin aspart  8 Units Subcutaneous TID WC  . insulin glargine  25 Units Subcutaneous Daily  . ipratropium  2 puff Inhalation Once  . isosorbide mononitrate  30 mg Oral QHS  . mouth rinse  15 mL Mouth Rinse BID  . multivitamin with minerals  1 tablet Oral Daily  . pantoprazole  40 mg Oral QHS  . potassium chloride  40 mEq Oral Q3H  . Ensure Max Protein  11 oz  Oral Daily  . rosuvastatin  20 mg Oral q1800  . topiramate  100 mg Oral QHS  . torsemide  60 mg Oral BID   Continuous Infusions:   LOS: 8 days    Time spent: 39 minutes spent on chart review, discussion with nursing  staff, consultants, updating family and interview/physical exam; more than 50% of that time was spent in counseling and/or coordination of care.    Eric J [place] DO [facility] 07/03/2019, 1:11 PM

## 2019-07-03 NOTE — Progress Notes (Signed)
Daily Progress Note   Patient Name: [name].       Date: 07/03/2019 DOB: [birth year]  Age: 58 y.o. MRN#: [id number] Attending Physician: [place] Eric J, DO Primary Care Physician: Caren Macadam, MD Admit Date: 06/25/2019  Reason for Consultation/Follow-up: Disposition and Establishing goals of care    Palliative Care Assessment & Plan   Patient Profile: This is a 58 year old man with multiple progressive chronic medical problems that include morbid obesity, COPD, OSA/OHS, CHF, CAD, DM2 and stage 4 CKD. He has had multiple readmissions for acute on chronic respiratory failure typically in the setting of pulmonary edema related to decompensated CHF. On this admission he again required mechanical ventilation after calling 911 for worsening dyspnea.  Assessment:  I spoke with both the patient and his wife regarding his medical condition, goals of care and care coordination.o written down key information we discussed today for them and contact numbers.  Overall [name] has a very poor prognosis for length of life, he and his wife know this- but he has a remarkable capacity to bounce back after critical, life threatening exacerbations of his illness. There are multiple barriers to helping this patient maintain his best possible health after he leaves the hospital- primarily issues with managing his CHF (significant edu needs),poor insight into the severity of his illness,  intolerance of the trilogy and lack of access to support, treatments and monitoring.  The main issues identified today:  1.His wife gives him his medication every morning and evening but works during the day, he is home alone, he scoots around on his rollator- she tells me he waits to long to call for  help -she sees he is starting to get SOB but he tells her not to call the hospital or his doctor- then it becomes a crisis.  2. He is very uncomfortable on the trilogy and it is an ongoing problem- he has tolerated the CPAP dream machine VERY well in the hospital -he tells me had the best night of sleep in a long time using this machine.  3. He does not have an accessible ACP or MOST documentation- he is agreeable to completing a MOST form today.  4. He has refused many services offered to him, but today he was interested in hearing about the possibilities.  Recommendations/Plan:  1. MOST FORM COMPLETED IN VYNCA AND PAPER COPY PLACED ON THE CHART. Total Time:30 minutes spent completing and discussing this document separate from the follow-up encounter.  NO CPR, NO INTUBATION, NIPPV OK, Tx for reversible illness as indicated.  2.  Requested referral to Paramedicine Program, Appreciate Jenna LCSW assistance with this and for seeing this patient during his inpatient stay. I believe they will be able to help significantly and he is very much open to this assistance currently. He is motivated to stay at home.  3. Home based Palliative Care- he needs someone to work with his PCP so he can get ongoing hydrocodone script for his severe neuropathy- it is extremely difficult physically for him to get to pain management visits and also to PCP appts. He is currently refusing hospice services even though he would certainly qualify. I have notified his PCP and will placed a referral.  4. Can he obtain the CPAP Dream Machine for home instead of the trilogy??- he is tolerating it very well in the hospital.    ACP Time:30 minutes FU Encounter Time: 45 minutes    Greater than 50%  of this time was spent counseling and coordinating care related to the above assessment and plan.  Lane Hacker, DO  Please contact Palliative Medicine Team phone at [phone] for questions and concerns.

## 2019-07-03 NOTE — Progress Notes (Signed)
CSW received referral from Palliative care team for [facility] referrral to follow patient in outpatient and help with compliance and monitoring at home.  CSW reviewed and confirmed pt is agreeable- referral completed and sent to paramedics for assignment.  Paramedicine Initial Assessment:  Housing:  In what kind of housing do you live? House/apt/trailer/shelter? trailer  Do you rent/pay a mortgage/own? own  Do you live with anyone? wife  Are you currently worried about losing your housing? no  Within the past 12 months have you ever stayed outside, in a car, tent, a shelter, or temporarily with someone?no  Within the past 12 months have you been unable to get utilities when it was really needed? no  Social:  What is your current marital status? married  Do you have any children? None reported  Do you have family or friends who live locally? Multiple cousins but states they are not particularly close.  Food:  Within the past 12 months were you ever worried that food would run out before you got money to buy more? no  Within the past 99months have you run out of food and didn't have money to buy more? no  Income:  What is your current source of income? SSDI  How hard is it for you to pay for the basics like food housing, medical care, and utilities? Not very  Do you have outstanding medical bills? no  Insurance:  Are you currently insured?  yes  Do you have prescription coverage? yes  If no insurance, have you applied for coverage (Medicaid, disability, marketplace etc)? n/a  Transportation:  Do you have transportation to your medical appointments? Yes private vehicle   In the past 12 months has lack of transportation kept you from medical appts or from getting medications? no  In the past 12 months has lack of transportation kept you from meetings, work, or getting things you needed? no   Daily Health Needs: Do you have a working scale at home? States he  needs one with a higher weight capacity  How do you manage your medications at home? Wife fills up pill box  Do you ever take your medications differently than prescribed? States no  Do you have issues affording your medications? No  If yes, has this ever prevented you from obtaining medications? no  Do you have any concerns with mobility at home? no  Do you use any assistive devices at home or have PCS at home? Uses a walker   Are there any additional barriers you see to getting the care you need? no  CSW will continue to follow through paramedicine program and assist as needed.   Jorge Ny, LCSW Clinical Social Worker [facility] Desk#: [phone] Cell#: [phone]

## 2019-07-04 DIAGNOSIS — Z66 Do not resuscitate: Secondary | ICD-10-CM

## 2019-07-04 LAB — BASIC METABOLIC PANEL
Anion gap: 14 (ref 5–15)
BUN: 64 mg/dL — ABNORMAL HIGH (ref 6–20)
CO2: 34 mmol/L — ABNORMAL HIGH (ref 22–32)
Calcium: 8.9 mg/dL (ref 8.9–10.3)
Chloride: 90 mmol/L — ABNORMAL LOW (ref 98–111)
Creatinine, Ser: 2.28 mg/dL — ABNORMAL HIGH (ref 0.61–1.24)
GFR calc Af Amer: 35 mL/min — ABNORMAL LOW (ref >60)
GFR calc non Af Amer: 30 mL/min — ABNORMAL LOW (ref >60)
Glucose, Bld: 174 mg/dL — ABNORMAL HIGH (ref 70–99)
Potassium: 3.6 mmol/L (ref 3.5–5.1)
Sodium: 138 mmol/L (ref 135–145)

## 2019-07-04 LAB — MAGNESIUM: Magnesium: 1.9 mg/dL (ref 1.7–2.4)

## 2019-07-04 LAB — GLUCOSE, CAPILLARY
Glucose-Capillary: 243 mg/dL — ABNORMAL HIGH (ref 70–99)
Glucose-Capillary: 177 mg/dL — ABNORMAL HIGH (ref 70–99)

## 2019-07-04 MED ORDER — BUDESONIDE 0.25 MG/2ML IN SUSP: 0.2500 mg | Freq: Two times a day (BID) | RESPIRATORY_TRACT | 0 refills | Status: AC

## 2019-07-04 MED ORDER — PANTOPRAZOLE SODIUM 40 MG PO TBEC: 40.0000 mg | DELAYED_RELEASE_TABLET | Freq: Every day | ORAL | 0 refills | Status: AC

## 2019-07-04 MED ORDER — INSULIN ASPART 100 UNIT/ML ~~LOC~~ SOLN
10.0000 [IU] | Freq: Three times a day (TID) | SUBCUTANEOUS | Status: DC
  Administered 2019-07-04 (×2): 10 [IU] via SUBCUTANEOUS

## 2019-07-04 MED ORDER — INSULIN GLARGINE 100 UNIT/ML ~~LOC~~ SOLN
30.0000 [IU] | Freq: Every day | SUBCUTANEOUS | Status: DC
  Administered 2019-07-04: 30 [IU] via SUBCUTANEOUS
  Filled 2019-07-04: qty 0.3

## 2019-07-04 NOTE — Progress Notes (Signed)
Physical Therapy Treatment Patient Details Name: [name]. 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 Pt intubated 11/4-11/6.    PT Comments    Pt remains limited overall secondary to generalized weakness and fatigue. He required less physical assistance this session as compared to previous. Pt on 4L of supplemental O2 throughout with no reports of dizziness or SOB. Pt would continue to benefit from skilled physical therapy services at this time while admitted and after d/c to address the below listed limitations in order to improve overall safety and independence with functional mobility.    Follow Up Recommendations  Home health PT;Supervision for mobility/OOB     Equipment Recommendations  None recommended by PT    Recommendations for Other Services       Precautions / Restrictions Precautions Precautions: Fall;Other (comment) Precaution Comments: Visually impaired in R eye, monitor SPO2, supplemental O2 Restrictions Weight Bearing Restrictions: No    Mobility  Bed Mobility Overal bed mobility: Needs Assistance Bed Mobility: Supine to Sit     Supine to sit: Supervision     General bed mobility comments: HOB elevated, use of bed rails, supervision for safety  Transfers Overall transfer level: Needs assistance Equipment used: Rolling walker (2 wheeled) Transfers: Sit to/from Stand Sit to Stand: Min guard         General transfer comment: good technique utilized, min guard for safety  Ambulation/Gait Ambulation/Gait assistance: Min guard Gait Distance (Feet): 15 Feet Assistive device: Rolling  walker (2 wheeled) Gait Pattern/deviations: Wide base of support;Shuffle Gait velocity: decreased   General Gait Details: pt with very wide BoS, cueing to maintain proximity to [employer] Mobility    Modified Rankin (Stroke Patients Only)       Balance Overall balance assessment: Needs assistance Sitting-balance support: Feet supported Sitting balance-Leahy Scale: Good     Standing balance support: Bilateral upper extremity supported Standing balance-Leahy Scale: Poor Standing balance comment: reliant on UE supports on RW                            Cognition Arousal/Alertness: Awake/alert Behavior During Therapy: WFL for tasks assessed/performed Overall Cognitive Status: Impaired/Different from baseline Area of Impairment: Safety/judgement;Problem solving                         Safety/Judgement: Decreased awareness of safety   Problem Solving: Difficulty sequencing;Requires verbal cues General Comments: Poor safety awareness and very particular.      Exercises      General Comments        Pertinent Vitals/Pain Pain Assessment: No/denies pain    Home Living                      Prior Function            PT Goals (current goals can now be found in the care plan section) Acute Rehab PT Goals PT Goal Formulation: With patient/family Time For Goal Achievement: 07/12/19 Potential to Achieve  Goals: Fair Progress towards PT goals: Progressing toward goals    Frequency    Min 3X/week      PT Plan Current plan remains appropriate    Co-evaluation              AM-PAC PT "6 Clicks" Mobility   Outcome Measure  Help needed turning from your back to your side while in a flat bed without using bedrails?: None Help needed moving from lying on your back to sitting on the side of a flat bed without using bedrails?: A Little Help needed moving to and from a bed to a chair (including a  wheelchair)?: A Little Help needed standing up from a chair using your arms (e.g., wheelchair or bedside chair)?: A Little Help needed to walk in hospital room?: A Little Help needed climbing 3-5 steps with a railing? : A Lot 6 Click Score: 18    End of Session Equipment Utilized During Treatment: Oxygen(pt refusing gait belt) Activity Tolerance: Patient tolerated treatment well Patient left: with call bell/phone within reach;Other (comment)(seated on toilet attempting to have a BM) Nurse Communication: Mobility status;Other (comment)(pt on toilet attempting to have a BM; condom cath dislodged) PT Visit Diagnosis: Muscle weakness (generalized) (M62.81);Difficulty in walking, not elsewhere classified (R26.2);Unsteadiness on feet (R26.81)     Time: 9357-0177 PT Time Calculation (min) (ACUTE ONLY): 23 min  Charges:  $Gait Training: 8-22 mins $Therapeutic Activity: 8-22 mins                     Anastasio Champion, DPT  Acute Rehabilitation Services Pager [phone] Office [place] 07/04/2019, 11:05 AM

## 2019-07-04 NOTE — Progress Notes (Signed)
Pt taken off CPAP at this time and placed on 4L [place]. RT will continue to monitor.

## 2019-07-04 NOTE — TOC Progression Note (Signed)
Transition of Care (TOC) - Progression Note    Patient Details  Name: [name]. MRN: [id number] Date of Birth: 30-Oct-1960  Transition of Care [facility]) CM/SW Contact  [name], RN Phone Number: 07/04/2019, 12:30 PM  Clinical Narrative:     Damaris Schooner to Edwyna Ready at [facility] regarding CPAP dream machine. Patient would need a sleep study within the 6 months to qualify for one ,or if he has not had sleep study within the last 6 months , have one scheduled and after appointment follow up with PCP . [facility] will also follow patient in the community.  I explained this to patient. He does want CPAP dream machine, however he does not want to go for sleep study " I know how I sleep" , explained insurance requirement etc. He said once he gets home he is not going anywhere and there is no need for sleep study. Explained again to patient sleep study is a requirement for CPAP dream machine, patient voiced understanding.   Expected Discharge Plan: [place] Barriers to Discharge: Continued Medical Work up  Expected Discharge Plan and Services Expected Discharge Plan: [place] In-house Referral: Hospice / Palliative Care Discharge Planning Services: CM Consult Post Acute Care Choice: Home Health, Durable Medical Equipment Living arrangements for the past 2 months: Single Family Home                 DME Arranged: Oxygen DME Agency: [facility] Date DME Agency Contacted: 07/02/19 Time DME Agency Contacted: 1829 Representative spoke with at DME Agency: [place]: RN, PT, Nurse's Aide [place] Agency: [facility] Date [place]: 07/02/19 Time [place]: 1333 Representative spoke with at [place]: [place] ([place]) Interventions    Readmission Risk Interventions Readmission Risk Prevention Plan 06/26/2019 04/24/2019 04/18/2019  Transportation Screening Complete Complete Complete  PCP or  Specialist Appt within 3-5 Days - - -  Not Complete comments - - -  [place] or [place] for [place] Planning/Counseling - - -  [place] - - -  Medication Review Press photographer) - Complete Complete  PCP or Specialist appointment within 3-5 days of discharge - (No Data) Patient refused  [place] or [place] - Complete -  Palliative Care Screening (No Data) Complete -  [place] (No Data) Not Applicable -  Some recent data might be hidden

## 2019-07-04 NOTE — Discharge Summary (Signed)
Physician Discharge Summary  [name]. AJG:[id number] DOB: 10/15/1960 DOA: 06/25/2019  PCP: Caren Macadam, MD  Admit date: 06/25/2019 Discharge date: 07/04/2019  Admitted From: Home Disposition:  Home  Recommendations for Outpatient Follow-up:  1. Follow up with PCP in 1-2 weeks 2. Encourage fluid restriction and low-salt diet  Home Health: Yes, PT, RN, home health aide Equipment/Devices: Oxygen, 4 L per nasal cannula  Discharge Condition: Stable CODE STATUS: DNR Diet recommendation: Heart Healthy / Carb Modified /fluid restriction 1.8L daily  History of present illness:  [name] is a 58 year old male with history of COPD, chronic hypoxic/hypercapnic respiratory failure on 2 to 3 L nasal cannula at home but had been switched to trilogy vent at home after recent hospitalization and discharge home, non adherent to CPAP, OSA, chronic renal disease stage III-IV, chronic diastolic heart failure, multiple recent hospitalizations with worsening respiratory failure requiring intubation presented with worsening shortness of breath on 06/25/2019. He was found to be hypoxic/hypercapnic and failed NIV therapy and required intubation. He was admitted under CCM service and also required vasopressors. COVID-19 testing was negative. Initially started on broad-spectrum antibiotics. He was also treated with intravenous Lasix and diuresed well. He was extubated on 06/27/2019 and transferred to [facility] service on 06/29/2019. Palliative care was also consulted.  Hospital course:  Acute on chronic hypoxic and hypercarbic respiratory failure COPD exacerbation Patient presented to ED with shortness of breath found to be hypoxic requiring intubation.  Was successfully extubated on 06/27/2019 and transferred to the hospitalist service on 06/29/2019. Covid-19/SARS-CoV-2 negative.  Patient received 3-day course of cefepime/azithromycin and 1 dose of vancomycin on admission.  Patient  received IV steroids which were tapered to oral and discontinued at time of discharge.  Patient will continue home nebs with Pulmicort and albuterol as needed.  Continue home supplemental oxygen, 4 L per nasal cannula.  Patient on NIV at home with trilogy.  Will have home health agency evaluate trilogy machine at home as patient states having difficulty with mask.  Patient also tolerated the Siemens dream station CPAP while inpatient, and requesting that he receive this machine.  Will need follow-up with PCP, may need repeat sleep study for consideration.  Discussed with patient and spouse, also may purchase this machine online.  Acute on chronic diastolic heart failure Limited echo during this hospitalization was poor study. Limited echo on 02/22/2019 showed EF of 55 to 60%.  Aggressively diuresed with IV diuretics during initial hospitalization and transition back to his home torsemide 80 mg p.o. twice daily.  He was net negative 33 L during the admission and discharge weight was 348.33 pounds.  Continue home carvedilol.  Patient encouraged to maintain fluid restriction of no more than 1.8 L/day with a low-salt diet.  Chronic kidney disease stage IV Baseline creatinine 2.3-2.7.  Not a candidate for dialysis given his poor medical compliance.  Creatinine at time of discharge, 2.28.  Continue close monitoring outpatient.  Anemia of chronic disease Etiology secondary to chronic kidney disease. Hemoglobin stable  Hypokalemia Repleted during hospitalization.  Potassium 3.6 at time of discharge.  Continue home potassium supplementation.  OSA/OHS, noncompliance to CPAP Patient states inability to tolerate trilogy mask at home, compliant with Siemens DreamStation CPAP during hospitalization.  Patient may need repeat sleep study in order to qualify for changing to CPAP.  This would likely be beneficial for him as he is tolerant of this CPAP he had during hospitalization, and likely leading etiology of  his multiple hospitalizations for his noncompliance  with his trilogy NIV at home.  Chronic paroxysmal atrial fibrillation  Rate controlled. Continue Eliquis and amiodarone.   Diabetes mellitus type 2 uncontrolled with hyperglycemia.  --Continue Lantus and CBGs with SSI.  Generalized deconditioning Noncompliance Overall prognosis is very poor. Patient is noncompliant to medications as well as CPAP. Patient has had recurrent recent hospitalization and multiple intubations. This time, he apparently has decided that he would not want any more intubations. His overall insight is very poor. He probably does not understand the gravity of the situation and keeps mentioning that he does not want to go to a rehab facility and wants to go home where his wife can take care of him.  Palliative care to continue to follow outpatient.  Obesity BMI 33.05, discussed with patient need for aggressive lifestyle changes and weight loss as this complicates all facets of care.  Discharge Diagnoses:  Active Problems:   Acute on chronic respiratory failure with hypoxia and hypercapnia (HCC)   DNR (do not resuscitate)    Discharge Instructions  Discharge Instructions    Amb Referral to Palliative Care   Complete by: As directed    See inpatient progress notes   Call MD for:  difficulty breathing, headache or visual disturbances   Complete by: As directed    Call MD for:  extreme fatigue   Complete by: As directed    Call MD for:  persistant dizziness or light-headedness   Complete by: As directed    Call MD for:  persistant nausea and vomiting   Complete by: As directed    Call MD for:  redness, tenderness, or signs of infection (pain, swelling, redness, odor or green/yellow discharge around incision site)   Complete by: As directed    Call MD for:  severe uncontrolled pain   Complete by: As directed    Call MD for:  temperature >100.4   Complete by: As directed    Diet - low sodium heart  healthy   Complete by: As directed    Increase activity slowly   Complete by: As directed      Allergies as of 07/04/2019      Reactions   Ivp Dye [iodinated Diagnostic Agents] Other (See Comments)   Passing out   Morphine And Related Anaphylaxis   "makes me stop breathing"   Novocain [procaine] Anaphylaxis   Prednisone Other (See Comments)   Heart racing   Carbamazepine    shaking   Codeine Other (See Comments)   "makes heart race" per patient   Gabapentin Other (See Comments)   Blood in urine   Other Other (See Comments)   Steroids makes hearts race      Medication List    STOP taking these medications   senna-docusate 8.6-50 MG tablet Commonly known as: Senokot-S     TAKE these medications   albuterol 108 (90 Base) MCG/ACT inhaler Commonly known as: VENTOLIN HFA Inhale 2 puffs into the lungs every 6 (six) hours as needed for wheezing or shortness of breath.   amiodarone 100 MG tablet Commonly known as: PACERONE Take 1 tablet (100 mg total) by mouth daily.   apixaban 5 MG Tabs tablet Commonly known as: Eliquis Take 1 tablet (5 mg total) by mouth 2 (two) times daily.   artificial tears Oint ophthalmic ointment Place 1 application into both eyes at bedtime as needed for dry eyes.   budesonide 0.25 MG/2ML nebulizer solution Commonly known as: PULMICORT Take 2 mLs (0.25 mg total) by nebulization 2 (two)  times daily.   carvedilol 12.5 MG tablet Commonly known as: COREG Take 1 tablet (12.5 mg total) by mouth 2 (two) times daily with a meal.   docusate sodium 100 MG capsule Commonly known as: COLACE Take 100 mg by mouth at bedtime.   DuoDERM Hydroactive Gel duoderm or generic equivalent gel dressing. 4cmx4cm. Apply to wound q 3 days until healed.   Ensure Max Protein Liqd Take 330 mLs (11 oz total) by mouth daily.   HYDROcodone-acetaminophen 10-325 MG tablet Commonly known as: NORCO Take 1 tablet by mouth every 6 (six) hours as needed (pain).    hydroxypropyl methylcellulose / hypromellose 2.5 % ophthalmic solution Commonly known as: ISOPTO TEARS / GONIOVISC Place 1 drop into both eyes 3 (three) times daily as needed for dry eyes.   insulin glargine 100 UNIT/ML injection Commonly known as: Lantus Inject 0.25 mLs (25 Units total) into the skin daily. What changed:   how much to take  when to take this   Insulin Pen Needle 31G X 8 MM Misc 1 Units by Does not apply route 2 (two) times a day.   ipratropium 0.02 % nebulizer solution Commonly known as: ATROVENT Take 2.5 mLs (0.5 mg total) by nebulization 2 (two) times daily.   isosorbide mononitrate 30 MG 24 hr tablet Commonly known as: IMDUR Take 1 tablet (30 mg total) by mouth daily. What changed: when to take this   levalbuterol 0.63 MG/3ML nebulizer solution Commonly known as: XOPENEX Take 3 mLs (0.63 mg total) by nebulization 2 (two) times daily.   multivitamin with minerals Tabs tablet Take 1 tablet by mouth daily.   nystatin powder Commonly known as: MYCOSTATIN/NYSTOP APPLY  1  GRAM TOPICALLY TWICE DAILY AS NEEDED Patient would like Rx delivered What changed:   how much to take  how to take this  when to take this  reasons to take this  additional instructions   ONE TOUCH ULTRA MINI w/Device Kit Use as directed twice a day   ONE TOUCH ULTRA TEST test strip Generic drug: glucose blood Use as instructed twice a day   onetouch ultrasoft lancets Use as instructed   pantoprazole 40 MG tablet Commonly known as: PROTONIX Take 1 tablet (40 mg total) by mouth at bedtime.   potassium chloride SA 20 MEQ tablet Commonly known as: KLOR-CON Take 2 tablets (40 mEq total) by mouth 2 (two) times daily.   rosuvastatin 20 MG tablet Commonly known as: CRESTOR Take 1 tablet (20 mg total) by mouth daily at 6 PM.   tiZANidine 4 MG tablet Commonly known as: ZANAFLEX TAKE 1 TABLET BY MOUTH FOUR TIMES DAILY AS NEEDED FOR MUSCLE SPASMS What changed:   how  much to take  how to take this  when to take this  reasons to take this  additional instructions   topiramate 100 MG tablet Commonly known as: TOPAMAX Take 1 tablet (100 mg total) by mouth at bedtime.   torsemide 20 MG tablet Commonly known as: DEMADEX Take 4 tablets (80 mg total) by mouth 2 (two) times daily.            Durable Medical Equipment  (From admission, onward)         Start     Ordered   07/02/19 1341  For home use only DME oxygen  Once    Question Answer Comment  Length of Need Lifetime   Mode or (Route) Nasal cannula   Liters per Minute 4   Frequency Continuous (stationary and  portable oxygen unit needed)   Oxygen conserving device Yes   Oxygen delivery system Gas      07/02/19 1341         Follow-up Information    [facility] Follow up.   Why: [facility]  Contact information: [place] [address] [phone]        [facility] Follow up.   Specialty: Home Health Services Contact information: [address] [place] [address] [phone]        Caren Macadam, MD. Schedule an appointment as soon as possible for a visit in 1 week(s).   Specialty: Family Medicine Contact information: [place] [address] [phone]        [name], MD .   Specialty: Cardiology Contact information: [address]  [place] [address] [phone]          Allergies  Allergen Reactions  . Ivp Dye [Iodinated Diagnostic Agents] Other (See Comments)    Passing out  . Morphine And Related Anaphylaxis    "makes me stop breathing"  . Novocain [Procaine] Anaphylaxis  . Prednisone Other (See Comments)    Heart racing  . Carbamazepine     shaking  . Codeine Other (See Comments)    "makes heart race" per patient  . Gabapentin Other (See Comments)    Blood in urine  . Other Other (See Comments)    Steroids makes hearts race     Consultations:  PCCM  Palliative care   Procedures/Studies: Dg Chest Port 1 View  Result Date: 06/28/2019 CLINICAL DATA:  Endotracheal tube. EXAM: PORTABLE CHEST 1 VIEW COMPARISON:  06/27/2019 FINDINGS: Endotracheal tube not visualized. Lungs are adequately inflated with persistent bilateral perihilar and bibasilar opacification which may be due to interstitial edema versus infection. Stable cardiomegaly. No significant effusion or pneumothorax. Remainder the exam is unchanged. IMPRESSION: Persistent mild bilateral perihilar and bibasilar opacification suggesting interstitial edema and less likely infection. Stable cardiomegaly. Electronically Signed   By: Marin Olp M.D.   On: 06/28/2019 09:32   Dg Chest Port 1 View  Result Date: 06/27/2019 CLINICAL DATA:  Endotracheal tube placement EXAM: PORTABLE CHEST 1 VIEW COMPARISON:  06/25/2019 FINDINGS: The endotracheal tube terminates above the carina. The enteric tube extends below the left hemidiaphragm. The heart size remains enlarged. The patient is status post prior median sternotomy. There are diffuse bilateral pulmonary opacities and bilateral pleural effusions. There is no pneumothorax IMPRESSION: 1. Lines and tubes as above. 2. Improving pulmonary opacities. Electronically Signed   By: Constance Holster M.D.   On: 06/27/2019 03:45   Dg Chest Portable 1 View  Result Date: 06/25/2019 CLINICAL DATA:  58 year old male status post intubation. EXAM: PORTABLE CHEST 1 VIEW COMPARISON:  Earlier radiograph dated 06/25/2019 FINDINGS: Interval placement of an endotracheal tube with tip approximately 2.5 cm above the carina. An enteric tube extends below the diaphragm with tip beyond the inferior margin of the image. Bilateral pulmonary opacities grossly similar to prior radiograph. Probable small bilateral pleural effusions. No pneumothorax. Stable cardiomegaly. Median sternotomy wires and CABG vascular clips. No acute osseous pathology.  IMPRESSION: 1. Interval placement of an endotracheal tube with tip above the carina. 2. No significant interval change in the appearance of the lungs compared to prior radiograph. Electronically Signed   By: Anner Crete M.D.   On: 06/25/2019 11:51   Dg Chest Portable 1 View  Result Date: 06/25/2019 CLINICAL DATA:  Shortness  of breath. Wheezing. EXAM: PORTABLE CHEST 1 VIEW COMPARISON:  Chest x-ray dated 05/13/2019, 03/23/2019, 04/23/2019 FINDINGS: Patient has extensive bilateral pulmonary infiltrates, with diffuse progression on the right and progression at the left base since the prior study. Chronic cardiomegaly. Pulmonary vascularity is normal. No definable effusions. No acute bone abnormality. IMPRESSION: 1. Extensive bilateral pulmonary infiltrates, with diffuse progression on the right and progression at the left base since the prior study. 2. Chronic cardiomegaly. Electronically Signed   By: Lorriane Shire M.D.   On: 06/25/2019 09:26     Transthoracic echocardiogram IMPRESSIONS Very poor study. Cannot comment on valve structure. Cannot accurately comment on LVF or wall motion wven with definiy contrast as imgas are very poor in quality.  Antimicrobials:   Vancomycin 11/4 - 11/5  Azithromycin 11/4 - 11/7  Cefepime 11/4 - 11/7   Subjective: Patient seen and examined at bedside, resting comfortably.  States slept very well overnight with Siemens dream station CPAP.  Discussed with him extensively at bedside need for significant lifestyle changes to include low-salt diet, fluid restriction, and compliance with his medical regimen to include nocturnal CPAP/NIV.  This was also relayed to his spouse via telephone this afternoon.  No other complaints or concerns at this time. Denies headache, no fever/chills/night sweats, no chest pain, no palpitations, no worse shortness of breath than his normal baseline, no abdominal pain, no cough/congestion.  No acute events overnight per nursing  staff.   Discharge Exam: Vitals:   07/03/19 1700 07/04/19 0601  BP: (!) 147/83 123/78  Pulse: 90 60  Resp:  19  Temp: 98.3 F (36.8 C) 98.4 F (36.9 C)  SpO2: 98% 95%   Vitals:   07/03/19 0500 07/03/19 1700 07/04/19 0500 07/04/19 0601  BP:  (!) 147/83  123/78  Pulse:  90  60  Resp:    19  Temp:  98.3 F (36.8 C)  98.4 F (36.9 C)  TempSrc:  Oral  Oral  SpO2:  98%  95%  Weight: (!) 158 kg  (!) 158 kg   Height:        General exam: Appears calm and comfortable, chronically ill appearance, appears older than stated age, obese Respiratory system: Decreased breath sounds bilateral bases, with mild crackles, normal respiratory effort, on 4 L nasal cannula oxygenating 100%. Cardiovascular system: S1 & S2 heard, RRR. No JVD, murmurs, rubs, gallops or clicks.  Trace to 1+ pedal edema bilateral lower extremities to mid shin. Gastrointestinal system: Abdomen is nondistended, protuberant, soft and nontender. No organomegaly or masses felt. Normal bowel sounds heard. Central nervous system: Alert and oriented. No focal neurological deficits. Extremities: Symmetric 5 x 5 power. Skin: No rashes, lesions or ulcers Psychiatry: Judgement and insight appear extremely poor. Mood & affect appropriate.     The results of significant diagnostics from this hospitalization (including imaging, microbiology, ancillary and laboratory) are listed below for reference.     Microbiology: Recent Results (from the past 240 hour(s))  SARS Coronavirus 2 by RT PCR (hospital order, performed in [facility] hospital lab) Nasopharyngeal Nasopharyngeal Swab     Status: None   Collection Time: 06/25/19  8:45 AM   Specimen: Nasopharyngeal Swab  Result Value Ref Range Status   SARS Coronavirus 2 NEGATIVE NEGATIVE Final    Comment: (NOTE) If result is NEGATIVE SARS-CoV-2 target nucleic acids are NOT DETECTED. The SARS-CoV-2 RNA is generally detectable in upper and lower  respiratory specimens during the  acute phase of infection. The lowest  concentration of SARS-CoV-2  viral copies this assay can detect is 250  copies / mL. A negative result does not preclude SARS-CoV-2 infection  and should not be used as the sole basis for treatment or other  patient management decisions.  A negative result may occur with  improper specimen collection / handling, submission of specimen other  than nasopharyngeal swab, presence of viral mutation(s) within the  areas targeted by this assay, and inadequate number of viral copies  (<250 copies / mL). A negative result must be combined with clinical  observations, patient history, and epidemiological information. If result is POSITIVE SARS-CoV-2 target nucleic acids are DETECTED. The SARS-CoV-2 RNA is generally detectable in upper and lower  respiratory specimens dur ing the acute phase of infection.  Positive  results are indicative of active infection with SARS-CoV-2.  Clinical  correlation with patient history and other diagnostic information is  necessary to determine patient infection status.  Positive results do  not rule out bacterial infection or co-infection with other viruses. If result is PRESUMPTIVE POSTIVE SARS-CoV-2 nucleic acids MAY BE PRESENT.   A presumptive positive result was obtained on the submitted specimen  and confirmed on repeat testing.  While 2019 novel coronavirus  (SARS-CoV-2) nucleic acids may be present in the submitted sample  additional confirmatory testing may be necessary for epidemiological  and / or clinical management purposes  to differentiate between  SARS-CoV-2 and other Sarbecovirus currently known to infect humans.  If clinically indicated additional testing with an alternate test  methodology [phone]) is advised. The SARS-CoV-2 RNA is generally  detectable in upper and lower respiratory sp ecimens during the acute  phase of infection. The expected result is Negative. Fact Sheet for Patients:   StrictlyIdeas.no Fact Sheet for Healthcare Providers: BankingDealers.co.za This test is not yet approved or cleared by the [place] FDA and has been authorized for detection and/or diagnosis of SARS-CoV-2 by FDA under an Emergency Use Authorization (EUA).  This EUA will remain in effect (meaning this test can be used) for the duration of the COVID-19 declaration under Section 564(b)(1) of the Act, 21 U.S.C. section 360bbb-3(b)(1), unless the authorization is terminated or revoked sooner. Performed at [facility], [place] [address], [place], [place] [address]   Urine culture     Status: Abnormal   Collection Time: 06/25/19 12:16 PM   Specimen: Urine, Clean Catch  Result Value Ref Range Status   Specimen Description URINE, CLEAN CATCH  Final   Special Requests   Final    NONE Performed at [facility], [place] [address], [place], [place] [address]    Culture (A)  Final    >=100,000 COLONIES/mL KLEBSIELLA PNEUMONIAE Confirmed Extended Spectrum Beta-Lactamase Producer (ESBL).  In bloodstream infections from ESBL organisms, carbapenems are preferred over piperacillin/tazobactam. They are shown to have a lower risk of mortality.    Report Status 06/27/2019 FINAL  Final   Organism ID, Bacteria KLEBSIELLA PNEUMONIAE (A)  Final      Susceptibility   Klebsiella pneumoniae - MIC*    AMPICILLIN >=32 RESISTANT Resistant     CEFAZOLIN >=64 RESISTANT Resistant     CEFTRIAXONE >=64 RESISTANT Resistant     CIPROFLOXACIN 1 SENSITIVE Sensitive     GENTAMICIN <=1 SENSITIVE Sensitive     IMIPENEM <=0.25 SENSITIVE Sensitive     NITROFURANTOIN 64 INTERMEDIATE Intermediate     TRIMETH/SULFA >=320 RESISTANT Resistant     AMPICILLIN/SULBACTAM >=32 RESISTANT Resistant     PIP/TAZO 16 SENSITIVE Sensitive  Extended ESBL POSITIVE Resistant     * >=100,000 COLONIES/mL KLEBSIELLA PNEUMONIAE  Culture, blood (routine x 2)     Status: None    Collection Time: 06/25/19 12:32 PM   Specimen: BLOOD LEFT WRIST  Result Value Ref Range Status   Specimen Description BLOOD LEFT WRIST  Final   Special Requests   Final    BOTTLES DRAWN AEROBIC ONLY Blood Culture adequate volume   Culture   Final    NO GROWTH 5 DAYS Performed at [facility], [address], [place], [place] [address]    Report Status 06/30/2019 FINAL  Final  Culture, blood (routine x 2)     Status: None   Collection Time: 06/25/19 12:54 PM   Specimen: BLOOD RIGHT HAND  Result Value Ref Range Status   Specimen Description BLOOD RIGHT HAND  Final   Special Requests   Final    BOTTLES DRAWN AEROBIC AND ANAEROBIC Blood Culture adequate volume   Culture   Final    NO GROWTH 5 DAYS Performed at [facility], [place] [address] [place], [place] [address]    Report Status 06/30/2019 FINAL  Final  MRSA PCR Screening     Status: None   Collection Time: 06/25/19  3:00 PM   Specimen: Nasal Mucosa; Nasopharyngeal  Result Value Ref Range Status   MRSA by PCR NEGATIVE NEGATIVE Final    Comment:        The GeneXpert MRSA Assay (FDA approved for NASAL specimens only), is one component of a comprehensive MRSA colonization surveillance program. It is not intended to diagnose MRSA infection nor to guide or monitor treatment for MRSA infections. Performed at [facility], [place] [address] [place], [place] [address]   Respiratory Panel by PCR     Status: None   Collection Time: 06/25/19  4:40 PM   Specimen: Nasopharyngeal Swab; Respiratory  Result Value Ref Range Status   Adenovirus NOT DETECTED NOT DETECTED Final   Coronavirus 229E NOT DETECTED NOT DETECTED Final    Comment: (NOTE) The Coronavirus on the Respiratory Panel, DOES NOT test for the novel  Coronavirus (2019 nCoV)    Coronavirus HKU1 NOT DETECTED NOT DETECTED Final   Coronavirus NL63 NOT DETECTED NOT DETECTED Final   Coronavirus OC43 NOT DETECTED NOT DETECTED Final   Metapneumovirus NOT  DETECTED NOT DETECTED Final   Rhinovirus / Enterovirus NOT DETECTED NOT DETECTED Final   Influenza A NOT DETECTED NOT DETECTED Final   Influenza B NOT DETECTED NOT DETECTED Final   Parainfluenza Virus 1 NOT DETECTED NOT DETECTED Final   Parainfluenza Virus 2 NOT DETECTED NOT DETECTED Final   Parainfluenza Virus 3 NOT DETECTED NOT DETECTED Final   Parainfluenza Virus 4 NOT DETECTED NOT DETECTED Final   Respiratory Syncytial Virus NOT DETECTED NOT DETECTED Final   Bordetella pertussis NOT DETECTED NOT DETECTED Final   Chlamydophila pneumoniae NOT DETECTED NOT DETECTED Final   Mycoplasma pneumoniae NOT DETECTED NOT DETECTED Final    Comment: Performed at [facility], [place]. [address], [place], [place] [address]  Culture, respiratory (non-expectorated)     Status: None   Collection Time: 06/25/19  4:45 PM   Specimen: Tracheal Aspirate; Respiratory  Result Value Ref Range Status   Specimen Description TRACHEAL ASPIRATE  Final   Special Requests NONE  Final   Gram Stain   Final    FEW WBC PRESENT,BOTH PMN AND MONONUCLEAR NO ORGANISMS SEEN    Culture   Final  Consistent with normal respiratory flora. Performed at [facility], Between [address], [place], [place] [address]    Report Status 06/29/2019 FINAL  Final     Labs: BNP (last 3 results) Recent Labs    04/13/19 1623 05/12/19 1018 06/25/19 0845  BNP 301.3* 198.7* 239.5*   Basic Metabolic Panel: Recent Labs  Lab 06/28/19 0258 06/30/19 0417 07/01/19 0236 07/02/19 0353 07/03/19 0432 07/04/19 0344  NA 149* 138 137 139 138 138  K 3.4* 3.3* 3.0* 3.2* 2.8* 3.6  CL 101 90* 88* 89* 88* 90*  CO2 33* 32 34* 35* 34* 34*  GLUCOSE 213* 204* 184* 206* 189* 174*  BUN 83* 89* 83* 76* 67* 64*  CREATININE 2.81* 2.69* 2.59* 2.41* 2.52* 2.28*  CALCIUM 8.8* 8.8* 9.1 9.0 9.1 8.9  MG 2.6* 2.3 2.1 2.1 2.1 1.9  PHOS 4.4  --   --   --   --   --    Liver Function Tests: No results for input(s): AST, ALT, ALKPHOS, BILITOT, PROT,  ALBUMIN in the last 168 hours. No results for input(s): LIPASE, AMYLASE in the last 168 hours. No results for input(s): AMMONIA in the last 168 hours. CBC: Recent Labs  Lab 06/28/19 0258 06/30/19 0417 07/02/19 0353 07/03/19 0432  WBC 11.3* 11.7* 15.9* 18.0*  NEUTROABS  --  7.5 11.9*  --   HGB 11.0* 12.2* 13.5 13.2  HCT 38.0* 41.0 45.2 43.7  MCV 97.9 95.6 95.0 94.0  PLT 202 181 220 219   Cardiac Enzymes: No results for input(s): CKTOTAL, CKMB, CKMBINDEX, TROPONINI in the last 168 hours. BNP: Invalid input(s): POCBNP CBG: Recent Labs  Lab 07/03/19 1143 07/03/19 1737 07/03/19 2210 07/04/19 0811 07/04/19 1142  GLUCAP 172* 164* 152* 243* 177*   D-Dimer No results for input(s): DDIMER in the last 72 hours. Hgb A1c No results for input(s): HGBA1C in the last 72 hours. Lipid Profile No results for input(s): CHOL, HDL, LDLCALC, TRIG, CHOLHDL, LDLDIRECT in the last 72 hours. Thyroid function studies No results for input(s): TSH, T4TOTAL, T3FREE, THYROIDAB in the last 72 hours.  Invalid input(s): FREET3 Anemia work up No results for input(s): VITAMINB12, FOLATE, FERRITIN, TIBC, IRON, RETICCTPCT in the last 72 hours. Urinalysis    Component Value Date/Time   COLORURINE YELLOW 06/25/2019 0845   APPEARANCEUR CLOUDY (A) 06/25/2019 0845   LABSPEC 1.010 06/25/2019 0845   PHURINE 5.0 06/25/2019 0845   GLUCOSEU NEGATIVE 06/25/2019 0845   HGBUR MODERATE (A) 06/25/2019 0845   BILIRUBINUR NEGATIVE 06/25/2019 0845   KETONESUR NEGATIVE 06/25/2019 0845   PROTEINUR 30 (A) 06/25/2019 0845   UROBILINOGEN 0.2 11/19/2012 2217   NITRITE POSITIVE (A) 06/25/2019 0845   LEUKOCYTESUR LARGE (A) 06/25/2019 0845   Sepsis Labs Invalid input(s): PROCALCITONIN,  WBC,  LACTICIDVEN Microbiology Recent Results (from the past 240 hour(s))  SARS Coronavirus 2 by RT PCR (hospital order, performed in [place] hospital lab) Nasopharyngeal Nasopharyngeal Swab     Status: None   Collection Time:  06/25/19  8:45 AM   Specimen: Nasopharyngeal Swab  Result Value Ref Range Status   SARS Coronavirus 2 NEGATIVE NEGATIVE Final    Comment: (NOTE) If result is NEGATIVE SARS-CoV-2 target nucleic acids are NOT DETECTED. The SARS-CoV-2 RNA is generally detectable in upper and lower  respiratory specimens during the acute phase of infection. The lowest  concentration of SARS-CoV-2 viral copies this assay can detect is 250  copies / mL. A negative result does not preclude SARS-CoV-2 infection  and should  not be used as the sole basis for treatment or other  patient management decisions.  A negative result may occur with  improper specimen collection / handling, submission of specimen other  than nasopharyngeal swab, presence of viral mutation(s) within the  areas targeted by this assay, and inadequate number of viral copies  (<250 copies / mL). A negative result must be combined with clinical  observations, patient history, and epidemiological information. If result is POSITIVE SARS-CoV-2 target nucleic acids are DETECTED. The SARS-CoV-2 RNA is generally detectable in upper and lower  respiratory specimens dur ing the acute phase of infection.  Positive  results are indicative of active infection with SARS-CoV-2.  Clinical  correlation with patient history and other diagnostic information is  necessary to determine patient infection status.  Positive results do  not rule out bacterial infection or co-infection with other viruses. If result is PRESUMPTIVE POSTIVE SARS-CoV-2 nucleic acids MAY BE PRESENT.   A presumptive positive result was obtained on the submitted specimen  and confirmed on repeat testing.  While 2019 novel coronavirus  (SARS-CoV-2) nucleic acids may be present in the submitted sample  additional confirmatory testing may be necessary for epidemiological  and / or clinical management purposes  to differentiate between  SARS-CoV-2 and other Sarbecovirus currently known to  infect humans.  If clinically indicated additional testing with an alternate test  methodology [phone]) is advised. The SARS-CoV-2 RNA is generally  detectable in upper and lower respiratory sp ecimens during the acute  phase of infection. The expected result is Negative. Fact Sheet for Patients:  StrictlyIdeas.no Fact Sheet for Healthcare Providers: BankingDealers.co.za This test is not yet approved or cleared by the [place] FDA and has been authorized for detection and/or diagnosis of SARS-CoV-2 by FDA under an Emergency Use Authorization (EUA).  This EUA will remain in effect (meaning this test can be used) for the duration of the COVID-19 declaration under Section 564(b)(1) of the Act, 21 U.S.C. section 360bbb-3(b)(1), unless the authorization is terminated or revoked sooner. Performed at [facility], [place] [address], [place], [place] [address]   Urine culture     Status: Abnormal   Collection Time: 06/25/19 12:16 PM   Specimen: Urine, Clean Catch  Result Value Ref Range Status   Specimen Description URINE, CLEAN CATCH  Final   Special Requests   Final    NONE Performed at [facility], [place] [address], [place], [place] [address]    Culture (A)  Final    >=100,000 COLONIES/mL KLEBSIELLA PNEUMONIAE Confirmed Extended Spectrum Beta-Lactamase Producer (ESBL).  In bloodstream infections from ESBL organisms, carbapenems are preferred over piperacillin/tazobactam. They are shown to have a lower risk of mortality.    Report Status 06/27/2019 FINAL  Final   Organism ID, Bacteria KLEBSIELLA PNEUMONIAE (A)  Final      Susceptibility   Klebsiella pneumoniae - MIC*    AMPICILLIN >=32 RESISTANT Resistant     CEFAZOLIN >=64 RESISTANT Resistant     CEFTRIAXONE >=64 RESISTANT Resistant     CIPROFLOXACIN 1 SENSITIVE Sensitive     GENTAMICIN <=1 SENSITIVE Sensitive     IMIPENEM <=0.25 SENSITIVE Sensitive     NITROFURANTOIN  64 INTERMEDIATE Intermediate     TRIMETH/SULFA >=320 RESISTANT Resistant     AMPICILLIN/SULBACTAM >=32 RESISTANT Resistant     PIP/TAZO 16 SENSITIVE Sensitive     Extended ESBL POSITIVE Resistant     * >=100,000 COLONIES/mL KLEBSIELLA PNEUMONIAE  Culture, blood (routine x 2)  Status: None   Collection Time: 06/25/19 12:32 PM   Specimen: BLOOD LEFT WRIST  Result Value Ref Range Status   Specimen Description BLOOD LEFT WRIST  Final   Special Requests   Final    BOTTLES DRAWN AEROBIC ONLY Blood Culture adequate volume   Culture   Final    NO GROWTH 5 DAYS Performed at [facility], [address], [place], [place] [address]    Report Status 06/30/2019 FINAL  Final  Culture, blood (routine x 2)     Status: None   Collection Time: 06/25/19 12:54 PM   Specimen: BLOOD RIGHT HAND  Result Value Ref Range Status   Specimen Description BLOOD RIGHT HAND  Final   Special Requests   Final    BOTTLES DRAWN AEROBIC AND ANAEROBIC Blood Culture adequate volume   Culture   Final    NO GROWTH 5 DAYS Performed at [facility], [place] [address], [place], [place] [address]    Report Status 06/30/2019 FINAL  Final  MRSA PCR Screening     Status: None   Collection Time: 06/25/19  3:00 PM   Specimen: Nasal Mucosa; Nasopharyngeal  Result Value Ref Range Status   MRSA by PCR NEGATIVE NEGATIVE Final    Comment:        The GeneXpert MRSA Assay (FDA approved for NASAL specimens only), is one component of a comprehensive MRSA colonization surveillance program. It is not intended to diagnose MRSA infection nor to guide or monitor treatment for MRSA infections. Performed at [facility], [place] [address] [place], [place] [address]   Respiratory Panel by PCR     Status: None   Collection Time: 06/25/19  4:40 PM   Specimen: Nasopharyngeal Swab; Respiratory  Result Value Ref Range Status   Adenovirus NOT DETECTED NOT DETECTED Final   Coronavirus 229E NOT DETECTED NOT DETECTED Final     Comment: (NOTE) The Coronavirus on the Respiratory Panel, DOES NOT test for the novel  Coronavirus (2019 nCoV)    Coronavirus HKU1 NOT DETECTED NOT DETECTED Final   Coronavirus NL63 NOT DETECTED NOT DETECTED Final   Coronavirus OC43 NOT DETECTED NOT DETECTED Final   Metapneumovirus NOT DETECTED NOT DETECTED Final   Rhinovirus / Enterovirus NOT DETECTED NOT DETECTED Final   Influenza A NOT DETECTED NOT DETECTED Final   Influenza B NOT DETECTED NOT DETECTED Final   Parainfluenza Virus 1 NOT DETECTED NOT DETECTED Final   Parainfluenza Virus 2 NOT DETECTED NOT DETECTED Final   Parainfluenza Virus 3 NOT DETECTED NOT DETECTED Final   Parainfluenza Virus 4 NOT DETECTED NOT DETECTED Final   Respiratory Syncytial Virus NOT DETECTED NOT DETECTED Final   Bordetella pertussis NOT DETECTED NOT DETECTED Final   Chlamydophila pneumoniae NOT DETECTED NOT DETECTED Final   Mycoplasma pneumoniae NOT DETECTED NOT DETECTED Final    Comment: Performed at [facility], [place], [place], [place] [address]  Culture, respiratory (non-expectorated)     Status: None   Collection Time: 06/25/19  4:45 PM   Specimen: Tracheal Aspirate; Respiratory  Result Value Ref Range Status   Specimen Description TRACHEAL ASPIRATE  Final   Special Requests NONE  Final   Gram Stain   Final    FEW WBC PRESENT,BOTH PMN AND MONONUCLEAR NO ORGANISMS SEEN    Culture   Final    Consistent with normal respiratory flora. Performed at [facility], [place] [address] [place], [place] [address]  Report Status 06/29/2019 FINAL  Final     Time coordinating discharge: Over 30 minutes  SIGNED:   Lianny Molter J [place] DO  [facility] 07/04/2019, 2:24 PM

## 2019-07-04 NOTE — Progress Notes (Signed)
Pt discharged home with wife in stable condition 

## 2019-07-04 NOTE — Care Management Important Message (Signed)
Important Message  Patient Details  Name: [name]. MRN: [id number] Date of Birth: 1961-07-05   Medicare Important Message Given:  Yes     Memory Argue 07/04/2019, 2:29 PM

## 2019-07-05 DIAGNOSIS — J449 Chronic obstructive pulmonary disease, unspecified: Secondary | ICD-10-CM | POA: Diagnosis not present

## 2019-07-05 DIAGNOSIS — E662 Morbid (severe) obesity with alveolar hypoventilation: Secondary | ICD-10-CM | POA: Diagnosis not present

## 2019-07-07 ENCOUNTER — Other Ambulatory Visit: Payer: Self-pay

## 2019-07-07 ENCOUNTER — Telehealth: Payer: Self-pay | Admitting: *Deleted

## 2019-07-07 ENCOUNTER — Telehealth (HOSPITAL_COMMUNITY): Payer: Self-pay

## 2019-07-07 ENCOUNTER — Telehealth (HOSPITAL_COMMUNITY): Payer: Self-pay | Admitting: Licensed Clinical Social Worker

## 2019-07-07 NOTE — Telephone Encounter (Signed)
Transition Care Management Follow-up Telephone Call   Date discharged? 11.13.2020    How have you been since you were released from the hospital? " I'm doing good up and around"    Do you understand why you were in the hospital? yes   Do you understand the discharge instructions? yes   Where were you discharged to? Home     Items Reviewed:  Medications reviewed: no,  Patient is unsure of what medications he is taking   Allergies reviewed: no, patient is unsure   Dietary changes reviewed: yes, patient is monitoring his fluid intake   Referrals reviewed: yes   Functional Questionnaire:   Activities of Daily Living (ADLs):   He states they are independent in the following: ambulation, bathing and hygiene, feeding, continence, grooming, toileting and dressing States they require assistance with the following: N/A    Any transportation issues/concerns?: yes   Any patient concerns? Yes. Would like information on foods high in salt, and preservative    Confirmed importance and date/time of follow-up visits scheduled yes  Provider Appointment booked with  Friday 07/11/19 with Dr. Ethlyn Gallery   Confirmed with patient if condition begins to worsen call PCP or go to the ER.  Patient was given the office number and encouraged to call back with question or concerns.  : yes

## 2019-07-07 NOTE — Telephone Encounter (Signed)
Contacted pt regarding referral to paramedicine services. He reports that he has "nurses out the wood works" and doesn't want anyone else coming out at this time.  I asked him to write down my name and number and should he change his mind after the nurses are done with their home visits and he needs further guidance in the home then to call me back.   Marylouise Stacks, EMT-Paramedic  07/07/19

## 2019-07-07 NOTE — Patient Outreach (Signed)
  [place] [facility] [facility]) Care Management Chronic Special Needs Program    07/07/2019  Name: Frank Douthat., DOB: [birth year]  MRN: [id number]   [name] is enrolled in a chronic special needs plan for Heart Failure. Client admitted 06/25/2019-07/04/2019 with acute on chronic respiratory failure. Discharged to home with home health.  [facility] Utilization Management to follow for transition of care. [facility] to attempt to engage client for complex community care management. Paramedicine to engage, however per note today from Marylouise Stacks, client is currently declining para-medicine services.   Plan: Continue Care Coordination.   Thea Silversmith, RN, MSN, [place] [phone]

## 2019-07-07 NOTE — Telephone Encounter (Signed)
CSW informed by community paramedic that pt not agreeable to her coming out.  CSW called pt to discuss as he was agreeable while in the hospital.  Pt thought that she would be doing physical therapy and was afraid of a bill.  CSW explained that paramedic does no PT and that there would be $0 copay for this service as it is outside of insurance.  CSW explained that role of paramedic is to keep him out of the hospital and be a more direct contact with the clinic.  Pt now agreeable to setting up initial appt with paramedic- paramedic informed  CSW will continue to follow and assist as needed  Jorge Ny, [place] Worker [facility] Desk#: [phone] Cell#: [phone]

## 2019-07-08 ENCOUNTER — Other Ambulatory Visit: Payer: Self-pay

## 2019-07-08 DIAGNOSIS — I482 Chronic atrial fibrillation, unspecified: Secondary | ICD-10-CM | POA: Diagnosis not present

## 2019-07-08 DIAGNOSIS — Z9181 History of falling: Secondary | ICD-10-CM | POA: Diagnosis not present

## 2019-07-08 DIAGNOSIS — B353 Tinea pedis: Secondary | ICD-10-CM | POA: Diagnosis not present

## 2019-07-08 DIAGNOSIS — E1122 Type 2 diabetes mellitus with diabetic chronic kidney disease: Secondary | ICD-10-CM | POA: Diagnosis not present

## 2019-07-08 DIAGNOSIS — I5042 Chronic combined systolic (congestive) and diastolic (congestive) heart failure: Secondary | ICD-10-CM | POA: Diagnosis not present

## 2019-07-08 DIAGNOSIS — M797 Fibromyalgia: Secondary | ICD-10-CM | POA: Diagnosis not present

## 2019-07-08 DIAGNOSIS — J9612 Chronic respiratory failure with hypercapnia: Secondary | ICD-10-CM | POA: Diagnosis not present

## 2019-07-08 DIAGNOSIS — J9611 Chronic respiratory failure with hypoxia: Secondary | ICD-10-CM | POA: Diagnosis not present

## 2019-07-08 DIAGNOSIS — I69392 Facial weakness following cerebral infarction: Secondary | ICD-10-CM | POA: Diagnosis not present

## 2019-07-08 DIAGNOSIS — Z951 Presence of aortocoronary bypass graft: Secondary | ICD-10-CM | POA: Diagnosis not present

## 2019-07-08 DIAGNOSIS — S81811D Laceration without foreign body, right lower leg, subsequent encounter: Secondary | ICD-10-CM | POA: Diagnosis not present

## 2019-07-08 DIAGNOSIS — I2510 Atherosclerotic heart disease of native coronary artery without angina pectoris: Secondary | ICD-10-CM | POA: Diagnosis not present

## 2019-07-08 DIAGNOSIS — I69354 Hemiplegia and hemiparesis following cerebral infarction affecting left non-dominant side: Secondary | ICD-10-CM | POA: Diagnosis not present

## 2019-07-08 DIAGNOSIS — S81812D Laceration without foreign body, left lower leg, subsequent encounter: Secondary | ICD-10-CM | POA: Diagnosis not present

## 2019-07-08 DIAGNOSIS — M109 Gout, unspecified: Secondary | ICD-10-CM | POA: Diagnosis not present

## 2019-07-08 DIAGNOSIS — Z7901 Long term (current) use of anticoagulants: Secondary | ICD-10-CM | POA: Diagnosis not present

## 2019-07-08 DIAGNOSIS — G4733 Obstructive sleep apnea (adult) (pediatric): Secondary | ICD-10-CM | POA: Diagnosis not present

## 2019-07-08 DIAGNOSIS — I130 Hypertensive heart and chronic kidney disease with heart failure and stage 1 through stage 4 chronic kidney disease, or unspecified chronic kidney disease: Secondary | ICD-10-CM | POA: Diagnosis not present

## 2019-07-08 DIAGNOSIS — Z9981 Dependence on supplemental oxygen: Secondary | ICD-10-CM | POA: Diagnosis not present

## 2019-07-08 DIAGNOSIS — Z794 Long term (current) use of insulin: Secondary | ICD-10-CM | POA: Diagnosis not present

## 2019-07-08 DIAGNOSIS — N184 Chronic kidney disease, stage 4 (severe): Secondary | ICD-10-CM | POA: Diagnosis not present

## 2019-07-08 DIAGNOSIS — Z6841 Body Mass Index (BMI) 40.0 and over, adult: Secondary | ICD-10-CM | POA: Diagnosis not present

## 2019-07-08 NOTE — Patient Outreach (Signed)
  [facility] [facility]) Care Management Chronic Special Needs Program    07/08/2019  Name: Frank Schreier., DOB: [birth year]  MRN: [id number]   [name] is enrolled in a chronic special needs plan.  Care Coordination: RNCM called [facility] [employer]-Spoke with [name] and confirmed that both Trilogy Vent and BiPap dream machine was delivered and set up on 07/07/2019 by Respiratory therapist. RNCM also followed up with [name] with [name] and confirmed start of care scheduled for today with a nurse to resume care. [name] reports client is slatted to have RN/PT/OT/bath aide and social work.  Plan: RNCM will care coordinate as needed; Continue to follow for care management needs.  Thea Silversmith, RN, MSN, [place] [phone]

## 2019-07-09 ENCOUNTER — Other Ambulatory Visit: Payer: Self-pay

## 2019-07-09 NOTE — Patient Outreach (Signed)
  [facility]) Care Management Chronic Special Needs Program   07/09/2019  Name: [name]., DOB: 07-13-1961  MRN: [id number]  The client was discussed in today's interdisciplinary care team meeting.  The following issues were discussed:  Client's needs, Changes in health status, Care Plan, Coordination of care, Care transitions and Issues/barriers to care  Participants present:                Thea Silversmith, MSN, RN, CCM                     Melissa Sandlin RN,BSN,CCM, CDE  Kelli Churn, RN, CCM, CDE Quinn Plowman RN, BSN, CCM            Maryella Shivers, MD            Gilda Crease, PharmD, RPh Bary Castilla, RN, BSN, MS, CCM Coralie Carpen, MD  Client has a home visit  with [facility] scheduled for tomorrow. Client is currently also active with [facility] care. Per note: Client has also agreed to setting up initial appointment with paramedic.    Plan: Continue care coordination with services as needed. RNCM will continue to follow as client's C-SNP chronic care management coordinator.  Thea Silversmith, RN, MSN, [place] [phone]

## 2019-07-10 NOTE — Progress Notes (Signed)
Virtual Visit via Telephone Note  I connected with [name].  on 07/11/19 at  8:00 AM EST by telephone and verified that I am speaking with the correct person using two identifiers.   I discussed the limitations, risks, security and privacy concerns of performing an evaluation and management service by telephone and the availability of in person appointments. I also discussed with the patient that there may be a patient responsible charge related to this service. The patient expressed understanding and agreed to proceed.  Location patient: home Location provider: work office Participants present for the call: patient, provider Patient did not have a visit in the prior 7 days to address this/these issue(s).   History of Present Illness: Visit today was scheduled for hospital follow-up.  Most recent admission was from 11/4-11/13.  Admitted for acute on chronic hypoxic and hypercarbic respiratory failure as well as COPD exacerbation, chronic diastolic heart failure, chronic kidney disease, hypokalemia, OSA/OHS with noncompliance to CPAP (was compliant with Siemens DreamStation CPAP during hospitalization), diabetes type 2, paroxysmal atrial fibrillation, noncompliance and general deconditioning.  Palliative care was consulted while in the hospital as patient stated he did not want any more intubations.  It was noted that his overall insight was poor and that he likely did not understand the gravity of his situation.  Prior to this admission, he was admitted on September 21 and discharged on October 15 for similar respiratory failure episode.  He was additionally admitted August 23 and discharged September 3 for respiratory failure.  He has an appointment on 11/30 with physical medicine.  He has an appointment on 12-29 with cardiology.   Last A1c was August 24 and was 7.8.   States that weight was down to 355; then in 14 days at [facility] - he was up to 387; 1 week later then ended  up back in hospital. Martin Majestic from this down to 342 when back in the hospital. States that doc kept telling him he was going to die. States that he was laying there with tube in throat and was frustrated with what he was being told. Didn't agree with idea that he was going to die. Didn't want to talk to hospice. States his internal instincts were telling him that it wasn't his time. Then he switched doctors and he went through everything that he was eating and drinking and they narrowed in on tea being issue with him. States that in house doc told him it was tea that was contributing to problem. Also told him that lemon and lime were a problem. Started drinking a combination of gingerale (sugar free), sugar free kool-aid. Feels that this has been a good combo and he has not had any swelling. He is watching feet. Doesn't have a way to weight himself. He has a little more swelling in right foot than left, but feels this came from eating fish other night (baked, not breaded). Not adding salt. Baking chicken as well.  Usually taking breathing treatment at 9am, then 8pm. Ended up going home with a Trilogy machine. Has been using that and it is working out for him. Had someone out to the house and set it up for him. Also told him what he was doing wrong previously. Felt like he was getting suffocated before, but wasn't closing off air vent previously and when that was closed off he "slept like a baby" through the night. Felt like this went well; breathing felt much better. Using 4L of O2 at  night and day. Keeping [place] in nose continuously. Doesn't have any visits scheduled with pulmonology. States that wife can't get off work to take him to these appointments.   Wanting an in home nurse to help with getting blood. Wondering if specialists can come to his house - specifically pain management?   Doesn't want PT. States getting up and down just fine.   Wound care nurse coming out weekly. Got skin injury from the [facility]  pulling on skin during therapy at [facility]. They wrapped legs, but he states didn't put any medication on this. When he got home wife cared for it with neosporin and wraps.   Not following with nephrologist. States doesn't have transportation.  Observations/Objective: Patient sounds cheerful and well on the phone. I do not appreciate any SOB. Speech and thought processing are grossly intact. Patient reported vitals:  Assessment and Plan:  1. Coronary artery disease involving coronary bypass graft of native heart with unstable angina pectoris [facility]) Has followed with cardiology in the hospital.  He is trying to eat better at home and limiting salt and fried food intake.  2. Atrial fibrillation with RVR (HCC) On Eliquis.  3. Acute on chronic diastolic heart failure ([place]) See above.  He is monitoring fluid levels, but does not have a scale.  4. Acute on chronic respiratory failure with hypoxia and hypercapnia (HCC) He is using trilogy machine now.  He does not have a follow-up with pulmonology, but I will see if I can put in a referral for this.  5. Chronic obstructive pulmonary disease, unspecified COPD type ([place]) Breathing feels better to him now.  He is using his Pulmicort on a regular basis.  6. Obstructive sleep apnea See above.  7. Dysphagia, unspecified type Doing better with eating since being home.  8. CKD stage 3 secondary to diabetes [facility]) He is not following with nephrology, but I will suggest this. - CBC with Differential/Platelet; Future - Comprehensive metabolic panel; Future  9. Type 2 diabetes mellitus with other specified complication, with long-term current use of insulin ([place]) We will plan to recheck A1c with next blood work. - Hemoglobin A1c; Future   Follow Up Instructions: Return for bloodwork in about 2 weeks..    [id number] 5-10 [id number] 11-20 9443 21-30 I did not refer this patient for an OV in the next 24 hours for this/these issue(s).  I  discussed the assessment and treatment plan with the patient. The patient was provided an opportunity to ask questions and all were answered. The patient agreed with the plan and demonstrated an understanding of the instructions.   The patient was advised to call back or seek an in-person evaluation if the symptoms worsen or if the condition fails to improve as anticipated.  I provided 30 minutes of non-face-to-face time during this encounter.   Micheline Rough, MD

## 2019-07-11 ENCOUNTER — Other Ambulatory Visit: Payer: Self-pay

## 2019-07-11 ENCOUNTER — Telehealth (INDEPENDENT_AMBULATORY_CARE_PROVIDER_SITE_OTHER): Payer: HMO | Admitting: Family Medicine

## 2019-07-11 DIAGNOSIS — I4891 Unspecified atrial fibrillation: Secondary | ICD-10-CM

## 2019-07-11 DIAGNOSIS — E1122 Type 2 diabetes mellitus with diabetic chronic kidney disease: Secondary | ICD-10-CM

## 2019-07-11 DIAGNOSIS — J9621 Acute and chronic respiratory failure with hypoxia: Secondary | ICD-10-CM | POA: Diagnosis not present

## 2019-07-11 DIAGNOSIS — E785 Hyperlipidemia, unspecified: Secondary | ICD-10-CM

## 2019-07-11 DIAGNOSIS — G4733 Obstructive sleep apnea (adult) (pediatric): Secondary | ICD-10-CM

## 2019-07-11 DIAGNOSIS — N183 Chronic kidney disease, stage 3 unspecified: Secondary | ICD-10-CM

## 2019-07-11 DIAGNOSIS — J9622 Acute and chronic respiratory failure with hypercapnia: Secondary | ICD-10-CM

## 2019-07-11 DIAGNOSIS — Z794 Long term (current) use of insulin: Secondary | ICD-10-CM | POA: Diagnosis not present

## 2019-07-11 DIAGNOSIS — J449 Chronic obstructive pulmonary disease, unspecified: Secondary | ICD-10-CM

## 2019-07-11 DIAGNOSIS — R1310 Dysphagia, unspecified: Secondary | ICD-10-CM

## 2019-07-11 DIAGNOSIS — I5033 Acute on chronic diastolic (congestive) heart failure: Secondary | ICD-10-CM | POA: Diagnosis not present

## 2019-07-11 DIAGNOSIS — E1169 Type 2 diabetes mellitus with other specified complication: Secondary | ICD-10-CM | POA: Diagnosis not present

## 2019-07-11 DIAGNOSIS — I25700 Atherosclerosis of coronary artery bypass graft(s), unspecified, with unstable angina pectoris: Secondary | ICD-10-CM

## 2019-07-14 NOTE — Progress Notes (Signed)
Spoke with the pt and informed him of the message below.  Patient declined referrals to specialists as he stated he does not have transportation and does not feel he has a problem with his kidneys and he has a breathing treatment he does at home and has enough nurses coming to his home and needs Hydrocodone.  Message sent to Dr Ethlyn Gallery.

## 2019-07-16 NOTE — Progress Notes (Signed)
Ok. I don't give him hydrocodone. He sees a pain management specialist for that.

## 2019-07-21 ENCOUNTER — Encounter (HOSPITAL_COMMUNITY): Payer: Self-pay | Admitting: *Deleted

## 2019-07-21 ENCOUNTER — Encounter: Payer: HMO | Admitting: Registered Nurse

## 2019-07-21 ENCOUNTER — Emergency Department (HOSPITAL_COMMUNITY)
Admission: EM | Admit: 2019-07-21 | Discharge: 2019-07-22 | Disposition: E | Payer: HMO | Attending: Emergency Medicine | Admitting: Emergency Medicine

## 2019-07-21 DIAGNOSIS — I2510 Atherosclerotic heart disease of native coronary artery without angina pectoris: Secondary | ICD-10-CM | POA: Diagnosis not present

## 2019-07-21 DIAGNOSIS — E119 Type 2 diabetes mellitus without complications: Secondary | ICD-10-CM | POA: Insufficient documentation

## 2019-07-21 DIAGNOSIS — I110 Hypertensive heart disease with heart failure: Secondary | ICD-10-CM | POA: Insufficient documentation

## 2019-07-21 DIAGNOSIS — I4510 Unspecified right bundle-branch block: Secondary | ICD-10-CM | POA: Diagnosis not present

## 2019-07-21 DIAGNOSIS — E1165 Type 2 diabetes mellitus with hyperglycemia: Secondary | ICD-10-CM | POA: Diagnosis not present

## 2019-07-21 DIAGNOSIS — Z85841 Personal history of malignant neoplasm of brain: Secondary | ICD-10-CM | POA: Insufficient documentation

## 2019-07-21 DIAGNOSIS — J80 Acute respiratory distress syndrome: Secondary | ICD-10-CM | POA: Diagnosis not present

## 2019-07-21 DIAGNOSIS — I469 Cardiac arrest, cause unspecified: Secondary | ICD-10-CM | POA: Insufficient documentation

## 2019-07-21 DIAGNOSIS — Z87891 Personal history of nicotine dependence: Secondary | ICD-10-CM | POA: Diagnosis not present

## 2019-07-21 DIAGNOSIS — I499 Cardiac arrhythmia, unspecified: Secondary | ICD-10-CM | POA: Diagnosis not present

## 2019-07-21 DIAGNOSIS — R404 Transient alteration of awareness: Secondary | ICD-10-CM | POA: Diagnosis not present

## 2019-07-21 DIAGNOSIS — I509 Heart failure, unspecified: Secondary | ICD-10-CM | POA: Insufficient documentation

## 2019-07-21 DIAGNOSIS — Z8673 Personal history of transient ischemic attack (TIA), and cerebral infarction without residual deficits: Secondary | ICD-10-CM | POA: Diagnosis not present

## 2019-07-21 MED ORDER — SODIUM BICARBONATE 8.4 % IV SOLN
INTRAVENOUS | Status: AC | PRN
  Administered 2019-07-21: 50 meq via INTRAVENOUS

## 2019-07-21 MED ORDER — CALCIUM CHLORIDE 10 % IV SOLN
INTRAVENOUS | Status: AC | PRN
  Administered 2019-07-21: 1 g via INTRAVENOUS

## 2019-07-21 MED ORDER — EPINEPHRINE 1 MG/10ML IJ SOSY
PREFILLED_SYRINGE | INTRAMUSCULAR | Status: AC | PRN
  Administered 2019-07-21: 1 mg via INTRAVENOUS

## 2019-07-22 ENCOUNTER — Other Ambulatory Visit: Payer: Self-pay

## 2019-07-22 MED FILL — Medication: Qty: 1 | Status: AC

## 2019-07-22 NOTE — ED Provider Notes (Signed)
[facility] Emergency Department Provider Note MRN:  [id number]  Arrival date & time: 2019-08-18     Chief Complaint   Cardiac Arrest   History of Present Illness   Keyontay Stolz. is a 58 y.o. year-old male with a history of brain tumor, CAD, CHF, diabetes, stroke presenting to the ED with chief complaint of cardiac arrest.  Collapse and unresponsiveness witnessed by family.  Quick start of CPR by family.  PEA arrest found to be initial rhythm upon EMS arrival.  I was unable to obtain an accurate HPI, PMH, or ROS due to the patient's unresponsiveness.  Level 5 caveat.  Review of Systems  Positive for cardiac arrest.  Patient's Health History    Past Medical History:  Diagnosis Date  . Brain tumor ([place])    Takes Topamax so patient will not have headaches  . Bruises easily   . CAD (coronary artery disease)    Cath 09, 99% LAD  . Cardiomyopathy, ischemic    Ischemic  . CHF (congestive heart failure) ([place])   . Cyst near coccyx   . Cyst near tailbone   . Diabetes mellitus without complication (HCC)    borderline  . Edema of both legs    Takes Lasix  . Fibromyalgia   . Gout   . Headache(784.0)    with lights  . Hypertension    dr Percival Spanish  . Kidney stones   . Myocardial infarction ([place])   . Obesity   . Pneumonia    hx of  . Shortness of breath   . Stroke [facility])    Affected vision, walking, and facial drooping on right face    Past Surgical History:  Procedure Laterality Date  . 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 ANAL FISTULECTOMY    . CARDIOVERSION N/A 05/08/2018   Procedure: CARDIOVERSION;  Surgeon: [name], MD;  Location: [facility] ENDOSCOPY;  Service: Cardiovascular;  Laterality: N/A;  . CORONARY ARTERY BYPASS GRAFT     LIMA to LAD 2009  . CRANIOTOMY Right 11/20/2012   Procedure: CRANIOTOMY  REPAIR DURAL/CENTRAL SPINAL FLUID LEAK;  Surgeon: [name], MD;  Location: [place] NEURO ORS;  Service: Neurosurgery;  Laterality: Right;  . EYE SURGERY     to coorect lid and double vision  . MEDIAN RECTUS REPAIR Right 02/16/2016   Procedure: RIGHT LATERAL RECTUS RESECTION; SUPERIOR RECTUS RESECTION RIGHT EYE; RIGHT SUPERIOR RECTUS RECESSION; LATERAL TARSAL STRIP RIGHT LOWER EYELID;  Surgeon: [name], MD;  Location: [place];  Service: Ophthalmology;  Laterality: Right;  . PLACEMENT OF LUMBAR DRAIN N/A 11/20/2012   Procedure: Attempted PLACEMENT OF LUMBAR DRAIN;  Surgeon: [name], MD;  Location: [place] NEURO ORS;  Service: Neurosurgery;  Laterality: N/A;  . RETROSIGMOID CRANIECTOMY FOR TUMOR RESECTION Right 10/11/2012   Procedure: RETROSIGMOID CRANIECTOMY FOR TUMOR RESECTION;  Surgeon: [name], MD;  Location: [place] NEURO ORS;  Service: Neurosurgery;  Laterality: Right;  Craniotomy for acoustic neuroma  . TEE WITHOUT CARDIOVERSION N/A 05/08/2018   Procedure: TRANSESOPHAGEAL ECHOCARDIOGRAM (TEE);  Surgeon: [name], MD;  Location: [facility] ENDOSCOPY;  Service: Cardiovascular;  Laterality: N/A;  . TRACHEOSTOMY TUBE PLACEMENT N/A 10/11/2012   Procedure: TRACHEOSTOMY;  Surgeon: Ascencion Dike, MD;  Location: [facility] NEURO ORS;  Service: ENT;  Laterality: N/A;  . UMBILICAL HERNIA REPAIR  Family History  Problem Relation Age of Onset  . CAD Father   . Hypertension Mother   . Cancer Other        multiple relatives  . Diabetes Maternal Grandmother   . Diabetes Maternal Aunt   . Leukemia Cousin   . Leukemia Maternal Grandfather     Social History   Socioeconomic History  . Marital status: Married    Spouse name: Not on file  . Number of children: Not on file  . Years of education: Not on file  . Highest education level: Not on file  Occupational History  . Not on file  Social Needs  . Financial resource strain: Not on file  . Food insecurity    Worry: Not on file    Inability: Not  on file  . Transportation needs    Medical: Not on file    Non-medical: Not on file  Tobacco Use  . Smoking status: Former Smoker    Packs/day: 0.25    Years: 20.00    Pack years: 5.00    Types: Cigarettes  . Smokeless tobacco: Current User  . Tobacco comment: Rare tobacco.  "[name]".  Substance and Sexual Activity  . Alcohol use: No    Alcohol/week: 0.0 standard drinks  . Drug use: No  . Sexual activity: Yes  Lifestyle  . Physical activity    Days per week: Not on file    Minutes per session: Not on file  . Stress: Not on file  Relationships  . Social Herbalist on phone: Not on file    Gets together: Not on file    Attends religious service: Not on file    Active member of club or organization: Not on file    Attends meetings of clubs or organizations: Not on file    Relationship status: Not on file  . Intimate partner violence    Fear of current or ex partner: Not on file    Emotionally abused: Not on file    Physically abused: Not on file    Forced sexual activity: Not on file  Other Topics Concern  . Not on file  Social History Narrative  . Not on file     Physical Exam  Vital Signs and Nursing Notes reviewed There were no vitals filed for this visit.  CONSTITUTIONAL: Ill-appearing, obese NEURO: Unresponsive EYES: Pupils unreactive ENT/NECK:  no LAD, no JVD, King airway filled with pink fluid CARDIO: Pulseless and poorly perfused PULM:  CTAB no wheezing or rhonchi GI/GU: Nondistended MSK/SPINE:  No gross deformities, no edema SKIN: Abrasions to right wrist PSYCH: Unable to assess  Diagnostic and Interventional Summary    EKG Interpretation  Date/Time:    Ventricular Rate:    PR Interval:    QRS Duration:   QT Interval:    QTC Calculation:   R Axis:     Text Interpretation:        Labs Reviewed - No data to display  No orders to display    Medications  EPINEPHrine (ADRENALIN) 1 MG/10ML injection (1 mg Intravenous Given  August 08, 2019 1257)  sodium bicarbonate injection (50 mEq Intravenous Given August 08, 2019 1301)  calcium chloride injection (1 g Intravenous Given 08-08-19 1303)     Procedures  /  Critical Care .Critical Care Performed by: Maudie Flakes, MD Authorized by: Maudie Flakes, MD   Critical care provider statement:    Critical care time (minutes):  38  Critical care was necessary to treat or prevent imminent or life-threatening deterioration of the following conditions:  Cardiac failure, circulatory failure and respiratory failure (Cardiac arrest)   Critical care was time spent personally by me on the following activities:  Discussions with consultants, evaluation of patient's response to treatment, examination of patient, ordering and performing treatments and interventions, ordering and review of laboratory studies, ordering and review of radiographic studies, pulse oximetry, re-evaluation of patient's condition, obtaining history from patient or surrogate and review of old charts Procedure Name: Intubation Date/Time: 08/16/19 1:19 PM Performed by: Maudie Flakes, MD Pre-anesthesia Checklist: Patient identified, Suction available, Patient being monitored and Emergency Drugs available Oxygen Delivery Method: Ambu bag Preoxygenation: Pre-oxygenation with 100% oxygen Laryngoscope Size: Glidescope and 4 Grade View: Grade I Tube size: 7.5 mm Number of attempts: 1 Airway Equipment and Method: Rigid stylet Placement Confirmation: ETT inserted through vocal cords under direct vision,  Positive ETCO2,  CO2 detector and Breath sounds checked- equal and bilateral Secured at: 23 cm Tube secured with: ETT holder Comments: Uncomplicated intubation, no RSI medications used.    Ultrasound ED Echo  Date/Time: 08-16-2019 1:20 PM Performed by: Maudie Flakes, MD Authorized by: Maudie Flakes, MD   Procedure details:    Indications: cardiac arrest     Views: apical 4 chamber view   Findings:     Cardiac Activity: agonal     LV Function: severly depressed (<30%)     RV Diameter: normal    Procedure:  Cardiopulmonary Resuscitation CPR was performed continuously with brief pulse/rhythm check every 2 minutes.  Quality of CPR monitored and coached by me personally. Indication: Cardiac arrest Duration:  8 minutes    ED Course and Medical Decision Making  I have reviewed the triage vital signs and the nursing notes.  Pertinent labs & imaging results that were available during my care of the patient were reviewed by me and considered in my medical decision making (see below for details).     Patient received a total of 75 minutes of CPR.  Unfavorable amount of time, unfavorable initial rhythm.  Had brief episodes of ventricular tachycardia according to EMS.  2 defibrillations in the field, 300 mg amiodarone, 11 mg epinephrine in the field.  CPR in progress on arrival, continued PEA here in the emergency department with a rate of 20-30.  King airway replaced with the ET tube as described above.  Bedside ultrasound revealed agonal cardiac activity but with contractions that coordinated with the very low heart rate.  Amp of bicarb and calcium provided but with no effect.  Efforts deemed futile, time of death 1:07 PM.  1:25 PM update: There is mention of DNR status and prior documentation but this information was not available during patient's arrival, no palpable DNR.  Barth Kirks. [place]onia Small, [place] [email or web address]  Final Clinical Impressions(s) / ED Diagnoses     ICD-10-CM   1. Cardiac arrest [facility])  I46.9     ED Discharge Orders    None       Discharge Instructions Discussed with and Provided to Patient:   Discharge Instructions   None       Maudie Flakes, MD 08-16-19 1325

## 2019-07-22 NOTE — Code Documentation (Signed)
No pulses noted upon arrival.  PEA with rate of 29.  CPR continued.  Pt was walking to car to go to scheduled Dr's appointment when he leaned over and became unresponsive.  Wife immediately began cpr.  Fire arrived 5 min later and performed cpr for several minutes until EMS arrived and started CPR at 1125.  11 epis, 300 amiodarone and 2 shocks given en-route.

## 2019-07-22 NOTE — Code Documentation (Signed)
Patient time of death occurred at 21.

## 2019-07-22 NOTE — Progress Notes (Signed)
Chaplain visited with family after getting the page for a death in the ED.  The chaplain provided emotional and grief support as well as helped the medical staff with some of the logistics.  The chaplain services are no longer needed.  Brion Aliment Chaplain Resident For questions concerning this note please contact me by pager [phone]

## 2019-07-22 NOTE — Code Documentation (Signed)
Pulse check.  No pulse present.  US shows cardiac activity. Compressions resumed.

## 2019-07-22 NOTE — Code Documentation (Addendum)
Pulse check.  No pulse present.  PEA at 29.  US shows cardiac activity in line with 29 beats.  Compressions resumed.

## 2019-07-22 NOTE — Patient Outreach (Signed)
  [facility]) Care Management Chronic Special Needs Program    07/22/2019  Name: Frank Heffern., DOB: November 13, 1960  MRN: [id number]   [name] is enrolled in a chronic special needs plan. Cased closed. Plan: RNCM will send case closure letter to primary care.  Thea Silversmith, RN, MSN, [place] [phone]

## 2019-07-22 NOTE — ED Notes (Signed)
Attempted to call bed placement.

## 2019-07-22 DEATH — deceased

## 2019-08-19 ENCOUNTER — Encounter (HOSPITAL_COMMUNITY): Payer: HMO | Admitting: Cardiology

## 2019-11-24 IMAGING — CT CT HEAD WITHOUT CONTRAST
3 of 4 series · 15 of 47 positions shown, 18 images · non-contrast
Comparison: 04/30/2018

CLINICAL DATA: Altered level of consciousness, shortness of Breath

EXAM:
CT HEAD WITHOUT CONTRAST
TECHNIQUE: Contiguous axial images were obtained from the base of the skull
through the vertex without intravenous contrast.

[Series 2: head wo · axial · 0.48mm/px · z∈[+1315,+1435]mm · 9 of 32 slices shown, 12 images]
[im 4/32  brain]
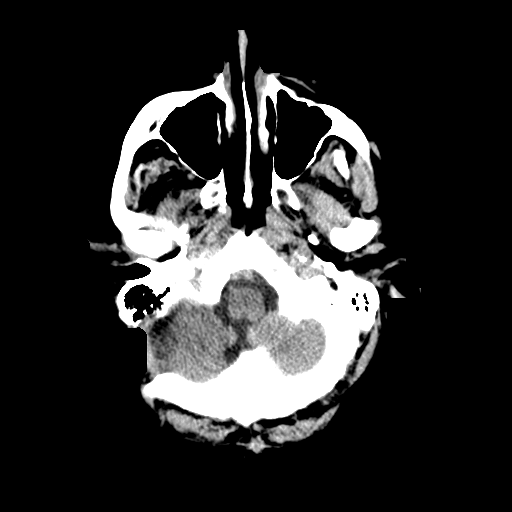
[im 4/32  bone]
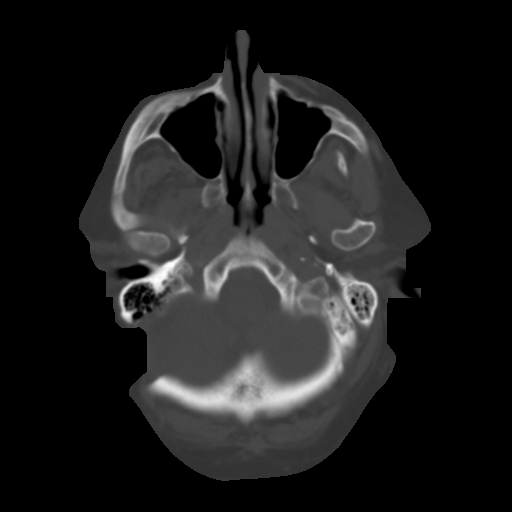
[im 7/32  brain]
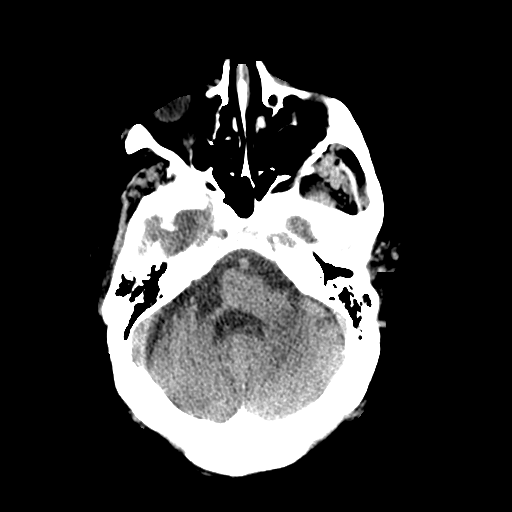
[im 10/32  brain]
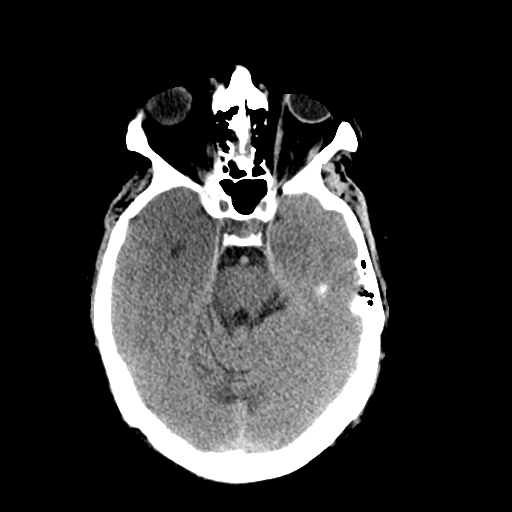
[im 13/32  brain]
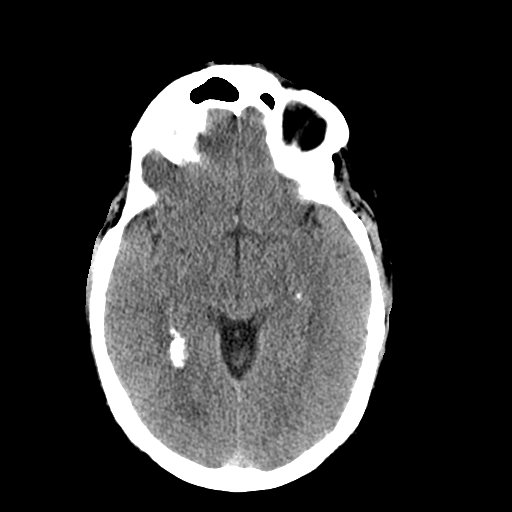
[im 16/32  brain]
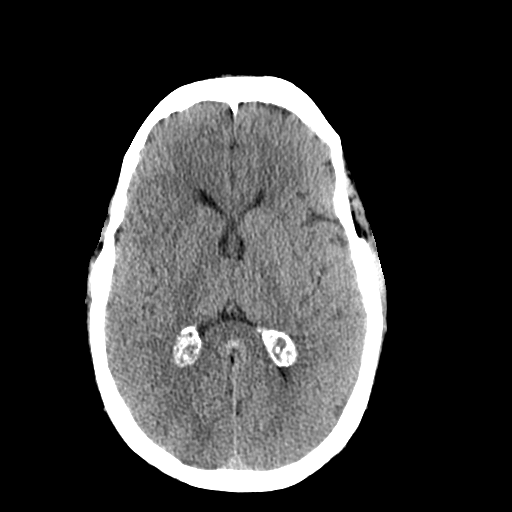
[im 16/32  bone]
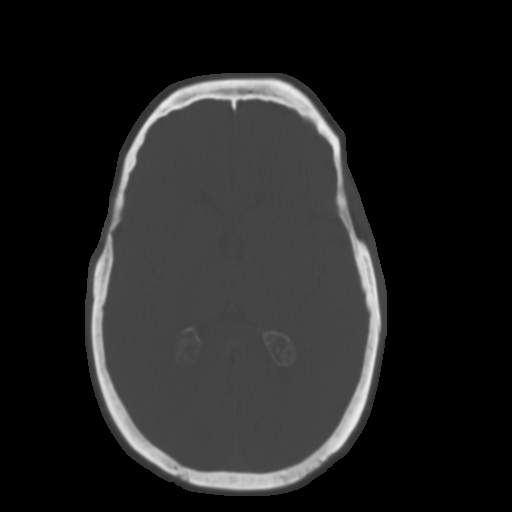
[im 19/32  brain]
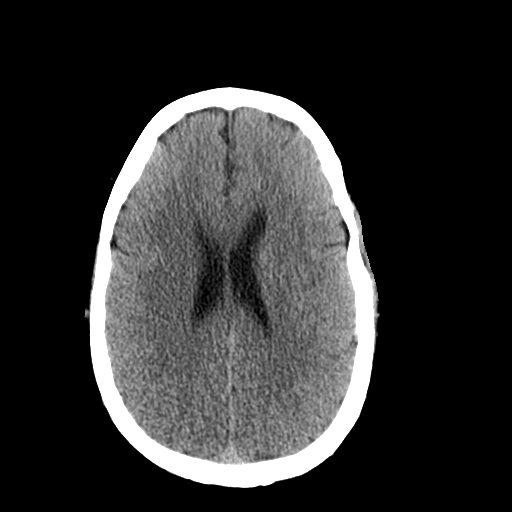
[im 22/32  brain]
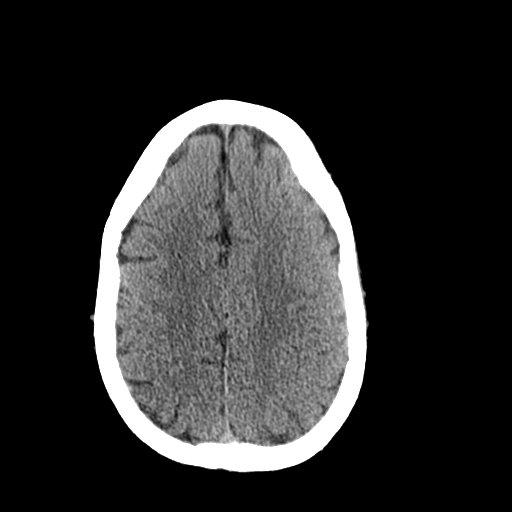
[im 25/32  brain]
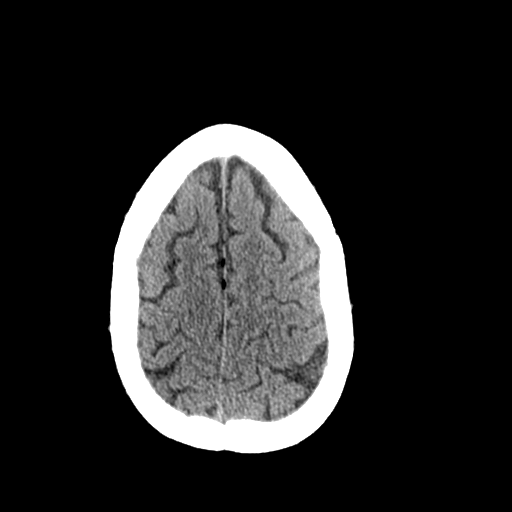
[im 28/32  brain]
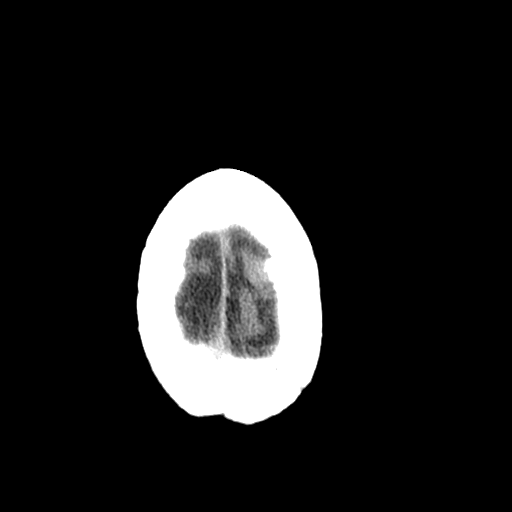
[im 28/32  bone]
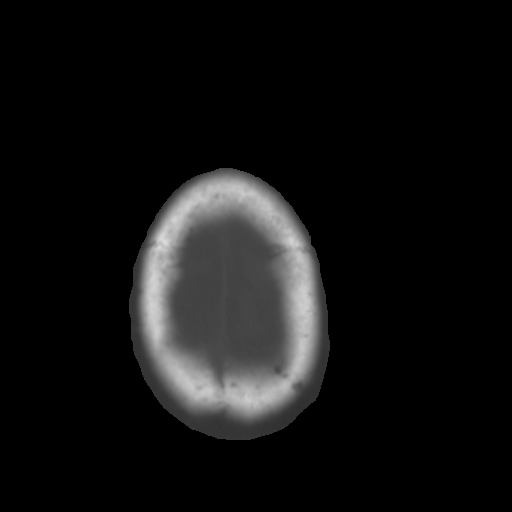

[Series 5: coronal soft tissue · coronal · 0.31mm/px · 3 of 79 slices shown]
[im 27/79  brain]
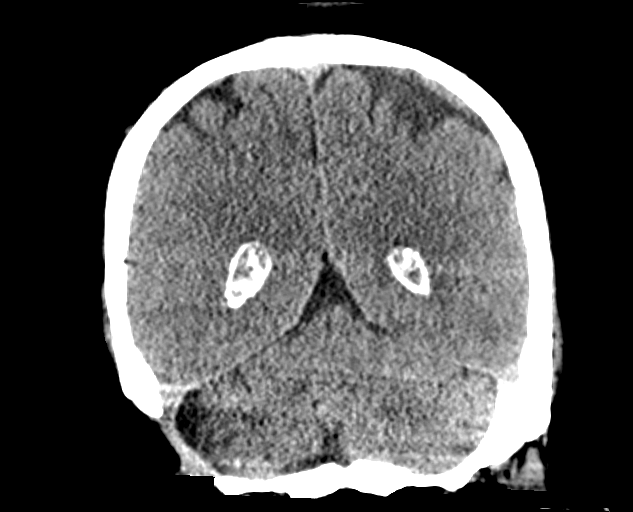
[im 35/79  brain]
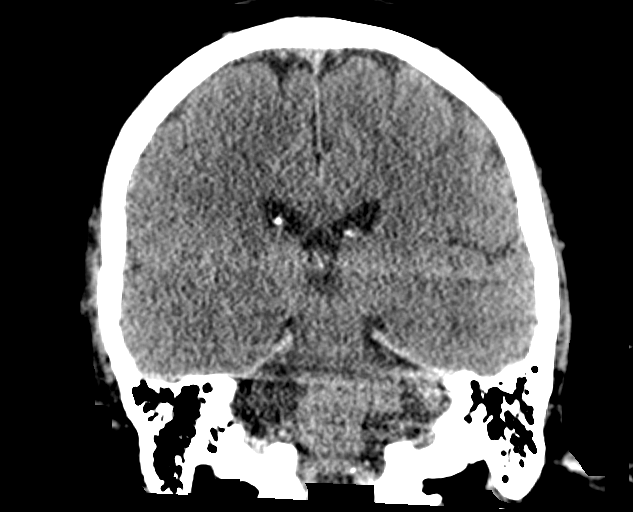
[im 44/79  brain]
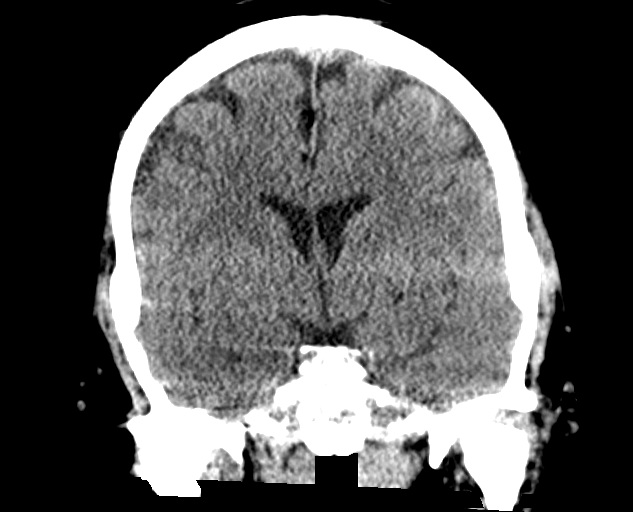

[Series 6: sagittal soft tissue · sagittal · 0.32mm/px · 3 of 56 slices shown]
[im 19/56  brain]
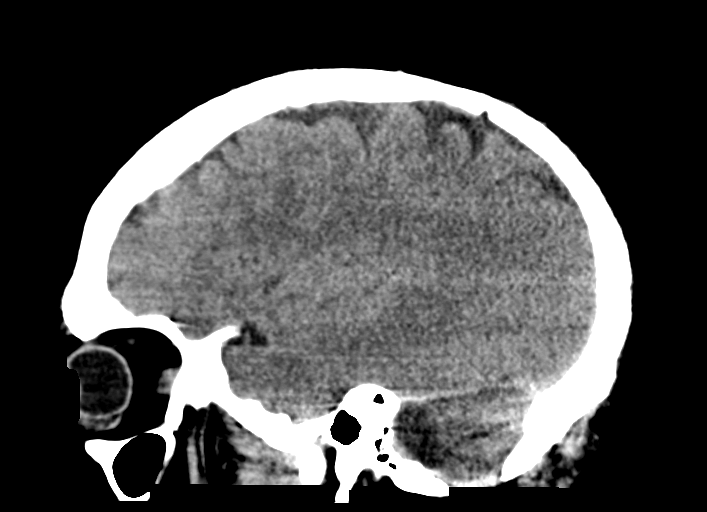
[im 28/56  brain]
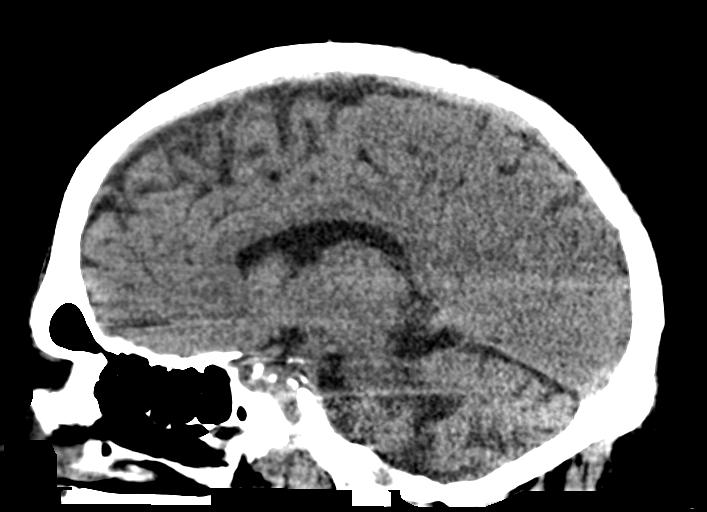
[im 37/56  brain]
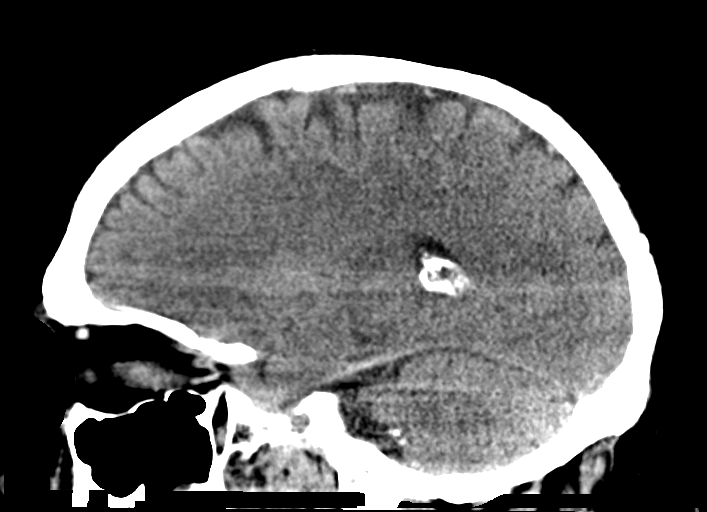

[15 of 47 positions shown; findings below may reference images not displayed]

FINDINGS: Brain: No acute intracranial abnormality. Specifically, no
hemorrhage, hydrocephalus, mass lesion, acute infarction, or
significant intracranial injury. Stable encephalomalacia at the
right CP angle and overlying craniectomy defect.

Vascular: No hyperdense vessel or unexpected calcification.

Skull: No acute calvarial abnormality.

Sinuses/Orbits: Visualized paranasal sinuses and mastoids clear.
Orbital soft tissues unremarkable.

Other: Prior right temporal occipital craniectomy.
IMPRESSION: No acute intracranial abnormality.

## 2020-01-15 IMAGING — DX DG ABD PORTABLE 1V
1 series · 1 of 1 positions shown · non-contrast
Comparison: 05/12/2019

CLINICAL DATA: OG tube placement

EXAM:
PORTABLE ABDOMEN - 1 VIEW

[abdomen kub]
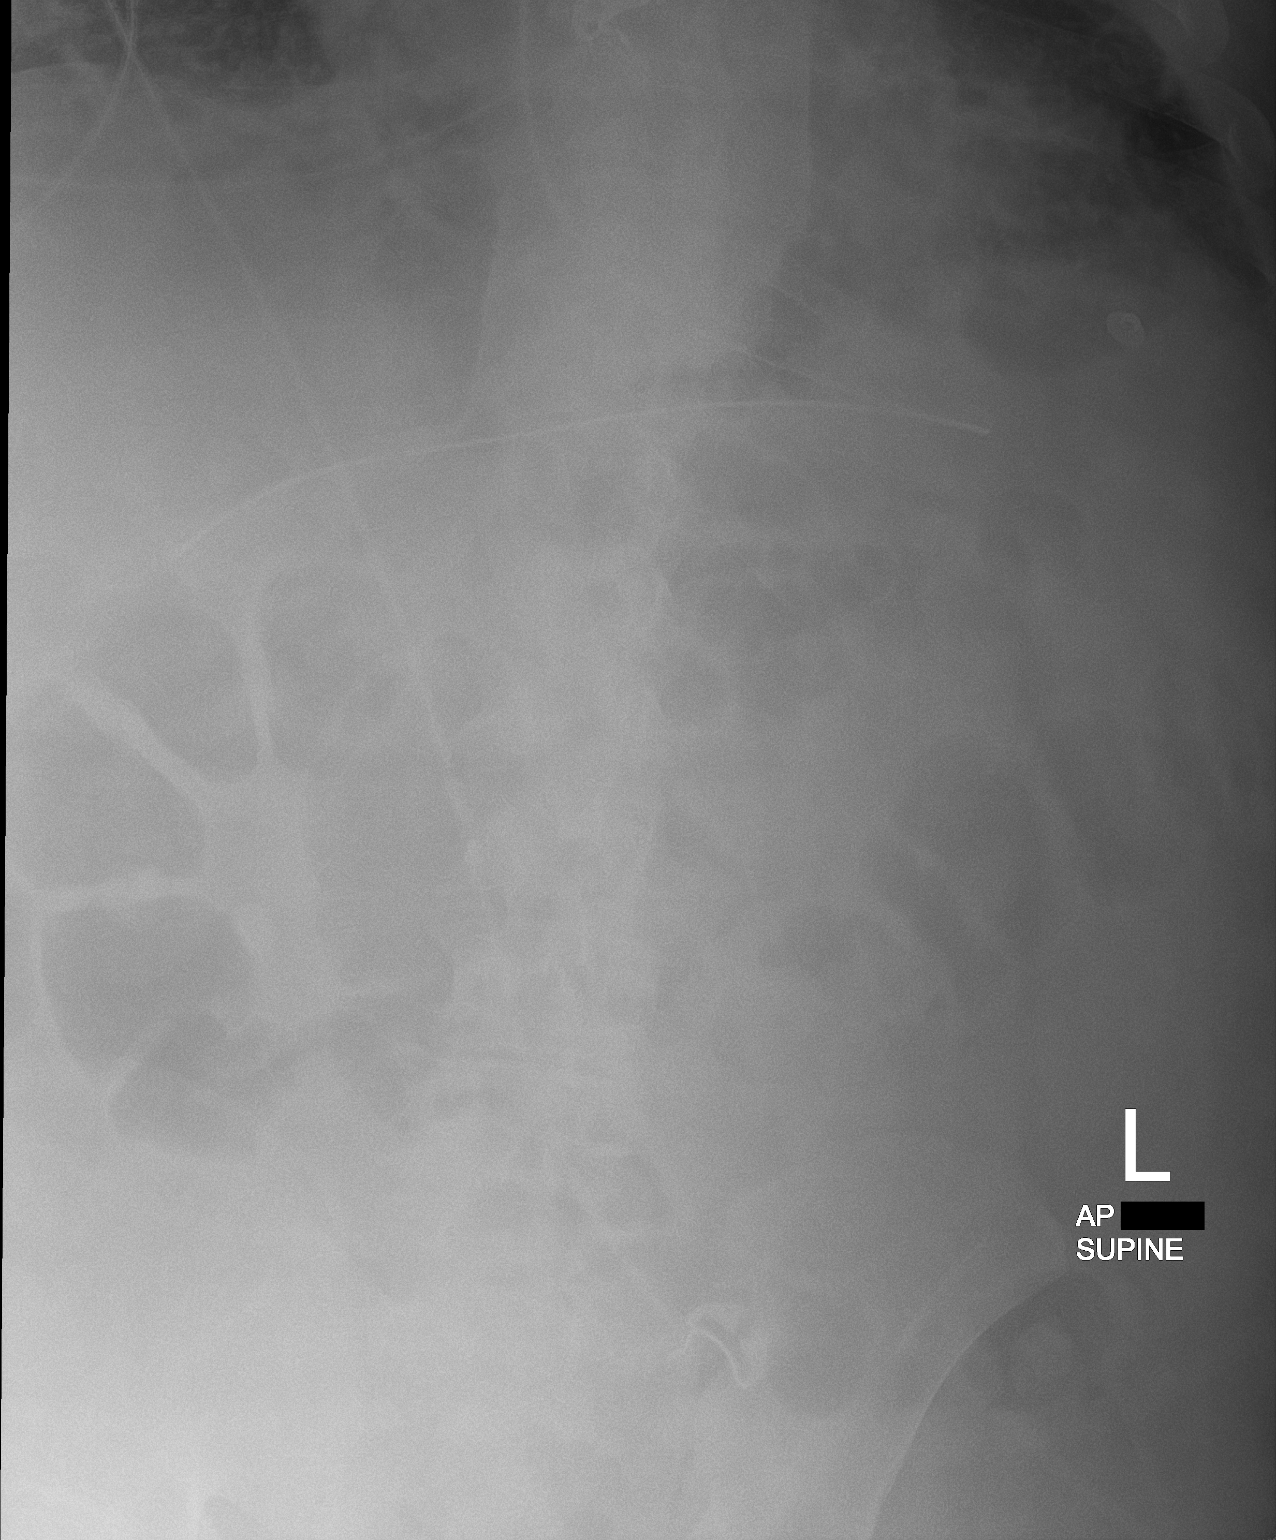

[1 of 1 positions shown; findings below may reference images not displayed]

FINDINGS: OG tube has been advanced. The tip is likely in the proximal
duodenum.
IMPRESSION: OG tube tip likely in the proximal duodenum.

## 2020-01-16 IMAGING — DX DG CHEST 1V PORT
1 series · 2 of 2 positions shown · non-contrast
Comparison: May 12, 2019

CLINICAL DATA: 58-year-old male with a history of respiratory
failure

EXAM:
PORTABLE CHEST 1 VIEW

[Series 1: chest ap · 0.14mm/px · 2 of 2 slices shown]
[im 1/2]
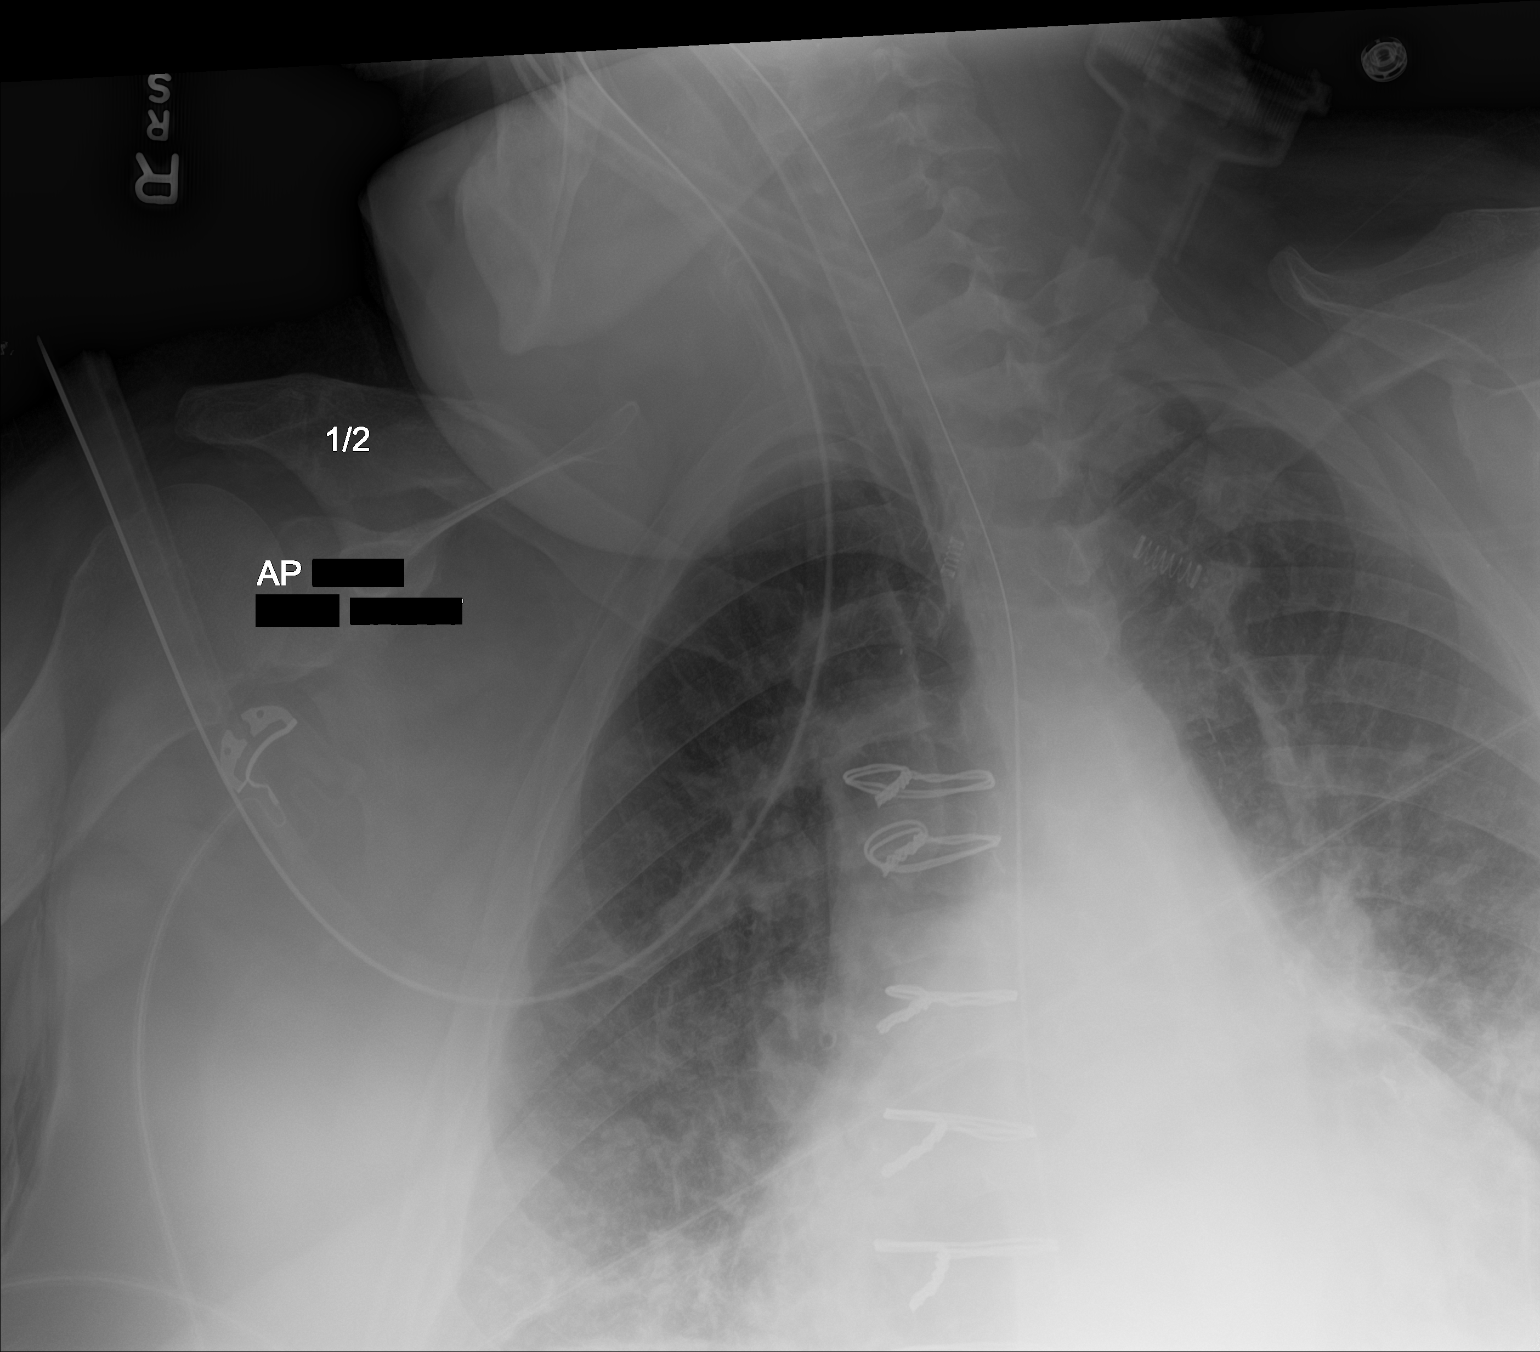
[im 2/2]
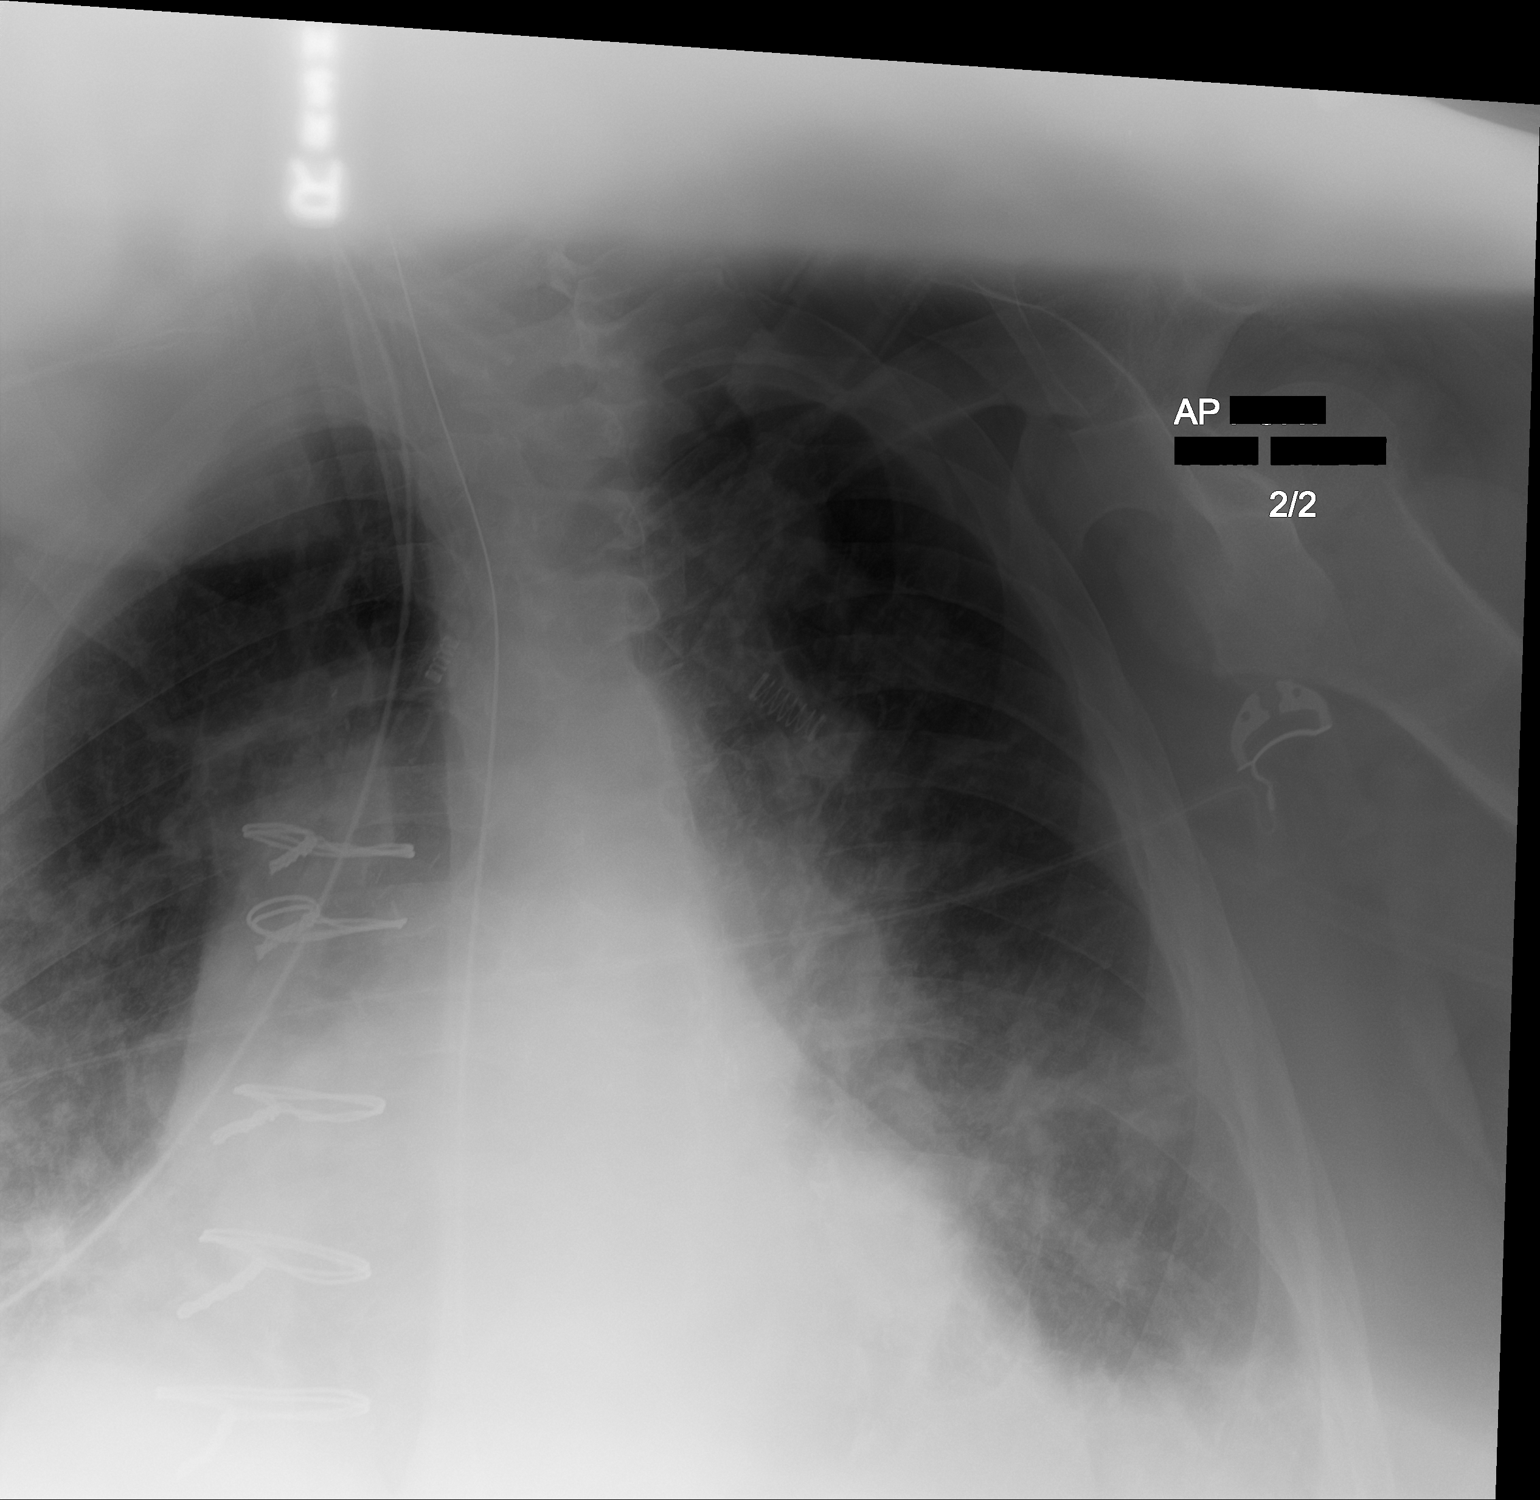

[2 of 2 positions shown; findings below may reference images not displayed]

FINDINGS: Incomplete visualization of the right chest.

Similar distribution of reticulonodular opacities of the bilateral
lungs with improved appearance at the left upper lung.

No pneumothorax.

Endotracheal tube terminates suitably above the carina,
approximately 6 cm. Gastric tube projects over the mediastinum and
terminates out of the field of view.

Surgical changes of median sternotomy.
IMPRESSION: Limited plain film demonstrates persisting bilateral reticulonodular
opacities with improved aeration at the left upper lung from the
prior.

Unchanged gastric tube and endotracheal tube.
